# Patient Record
Sex: Female | Born: 1950
Health system: Southern US, Community
[De-identification: ages and names within clinical notes are randomized; demographics above are authoritative.]

## PROBLEM LIST (undated history)

## (undated) DIAGNOSIS — E539 Vitamin B deficiency, unspecified: Secondary | ICD-10-CM

## (undated) DIAGNOSIS — I615 Nontraumatic intracerebral hemorrhage, intraventricular: Secondary | ICD-10-CM

## (undated) DIAGNOSIS — I69951 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: Secondary | ICD-10-CM

## (undated) DIAGNOSIS — N189 Chronic kidney disease, unspecified: Secondary | ICD-10-CM

## (undated) DIAGNOSIS — R2681 Unsteadiness on feet: Secondary | ICD-10-CM

## (undated) DIAGNOSIS — K219 Gastro-esophageal reflux disease without esophagitis: Secondary | ICD-10-CM

## (undated) DIAGNOSIS — R41841 Cognitive communication deficit: Secondary | ICD-10-CM

## (undated) DIAGNOSIS — R569 Unspecified convulsions: Secondary | ICD-10-CM

## (undated) DIAGNOSIS — Z950 Presence of cardiac pacemaker: Secondary | ICD-10-CM

## (undated) DIAGNOSIS — R2689 Other abnormalities of gait and mobility: Secondary | ICD-10-CM

## (undated) DIAGNOSIS — I680 Cerebral amyloid angiopathy: Secondary | ICD-10-CM

## (undated) DIAGNOSIS — I639 Cerebral infarction, unspecified: Secondary | ICD-10-CM

## (undated) DIAGNOSIS — E785 Hyperlipidemia, unspecified: Secondary | ICD-10-CM

## (undated) DIAGNOSIS — J45909 Unspecified asthma, uncomplicated: Secondary | ICD-10-CM

## (undated) DIAGNOSIS — M6281 Muscle weakness (generalized): Secondary | ICD-10-CM

## (undated) DIAGNOSIS — M1990 Unspecified osteoarthritis, unspecified site: Secondary | ICD-10-CM

## (undated) DIAGNOSIS — I10 Essential (primary) hypertension: Secondary | ICD-10-CM

## (undated) DIAGNOSIS — G4700 Insomnia, unspecified: Secondary | ICD-10-CM

## (undated) DIAGNOSIS — R4701 Aphasia: Secondary | ICD-10-CM

## (undated) DIAGNOSIS — E119 Type 2 diabetes mellitus without complications: Secondary | ICD-10-CM

## (undated) HISTORY — PX: TONSILLECTOMY: SUR1361

## (undated) HISTORY — DX: Unspecified asthma, uncomplicated: J45.909

## (undated) HISTORY — DX: Cognitive communication deficit: R41.841

## (undated) HISTORY — PX: BREAST EXCISIONAL BIOPSY: SUR124

## (undated) HISTORY — DX: Gastro-esophageal reflux disease without esophagitis: K21.9

## (undated) HISTORY — DX: 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: I69.951

## (undated) HISTORY — PX: BREAST BIOPSY: SHX20

## (undated) HISTORY — PX: BACK SURGERY: SHX140

## (undated) HISTORY — DX: Cerebral infarction, unspecified: I63.9

## (undated) HISTORY — DX: Other abnormalities of gait and mobility: R26.89

## (undated) HISTORY — DX: Cerebral amyloid angiopathy: I68.0

## (undated) HISTORY — DX: Type 2 diabetes mellitus without complications: E11.9

## (undated) HISTORY — DX: Chronic kidney disease, unspecified: N18.9

## (undated) HISTORY — DX: Essential (primary) hypertension: I10

## (undated) HISTORY — DX: Muscle weakness (generalized): M62.81

## (undated) HISTORY — DX: Nontraumatic intracerebral hemorrhage, intraventricular: I61.5

## (undated) HISTORY — DX: Presence of cardiac pacemaker: Z95.0

## (undated) HISTORY — DX: Insomnia, unspecified: G47.00

## (undated) HISTORY — DX: Hyperlipidemia, unspecified: E78.5

## (undated) HISTORY — DX: Vitamin B deficiency, unspecified: E53.9

## (undated) HISTORY — PX: CERVICAL SPINE SURGERY: SHX589

## (undated) HISTORY — DX: Unspecified convulsions: R56.9

## (undated) HISTORY — DX: Unsteadiness on feet: R26.81

## (undated) HISTORY — DX: Unspecified osteoarthritis, unspecified site: M19.90

## (undated) HISTORY — DX: Aphasia: R47.01

---

## 2004-06-25 ENCOUNTER — Encounter: Admission: RE | Admit: 2004-06-25 | Discharge: 2004-09-23 | Payer: Self-pay | Admitting: Family Medicine

## 2004-12-13 ENCOUNTER — Encounter: Admission: RE | Admit: 2004-12-13 | Discharge: 2005-03-13 | Payer: Self-pay | Admitting: Family Medicine

## 2006-02-24 ENCOUNTER — Ambulatory Visit: Payer: Self-pay | Admitting: Family Medicine

## 2006-05-28 ENCOUNTER — Ambulatory Visit: Payer: Self-pay | Admitting: Family Medicine

## 2006-05-28 LAB — CONVERTED CEMR LAB
Glucose, Bld: 146 mg/dL — ABNORMAL HIGH (ref 70–99)
HDL: 29.4 mg/dL — ABNORMAL LOW (ref >39.0)
Triglyceride fasting, serum: 93 mg/dL (ref 0–149)

## 2006-07-07 ENCOUNTER — Ambulatory Visit: Payer: Self-pay | Admitting: Family Medicine

## 2006-07-07 LAB — CONVERTED CEMR LAB
BUN: 13 mg/dL (ref 6–23)
Chloride: 104 meq/L (ref 96–112)
Creatinine, Ser: 1.2 mg/dL (ref 0.4–1.2)
Potassium: 3 meq/L — ABNORMAL LOW (ref 3.5–5.1)

## 2006-07-18 ENCOUNTER — Ambulatory Visit: Payer: Self-pay | Admitting: Family Medicine

## 2006-07-18 LAB — CONVERTED CEMR LAB: Potassium: 3.3 meq/L — ABNORMAL LOW (ref 3.5–5.1)

## 2006-07-25 ENCOUNTER — Ambulatory Visit: Payer: Self-pay | Admitting: Family Medicine

## 2006-07-25 LAB — CONVERTED CEMR LAB: Potassium: 3.5 meq/L (ref 3.5–5.1)

## 2006-09-09 ENCOUNTER — Ambulatory Visit: Payer: Self-pay | Admitting: Family Medicine

## 2006-09-09 LAB — CONVERTED CEMR LAB
ALT: 41 units/L — ABNORMAL HIGH (ref 0–40)
BUN: 12 mg/dL (ref 6–23)
CO2: 32 meq/L (ref 19–32)
Creatinine, Ser: 0.9 mg/dL (ref 0.4–1.2)
HDL: 35.6 mg/dL — ABNORMAL LOW (ref >39.0)
Potassium: 3.4 meq/L — ABNORMAL LOW (ref 3.5–5.1)
Triglycerides: 123 mg/dL (ref 0–149)

## 2006-09-29 ENCOUNTER — Ambulatory Visit: Payer: Self-pay | Admitting: Family Medicine

## 2006-09-29 LAB — CONVERTED CEMR LAB
BUN: 10 mg/dL (ref 6–23)
Calcium: 9.6 mg/dL (ref 8.4–10.5)
Chloride: 102 meq/L (ref 96–112)
GFR calc non Af Amer: 61 mL/min
Glucose, Bld: 129 mg/dL — ABNORMAL HIGH (ref 70–99)

## 2006-12-11 DIAGNOSIS — N39498 Other specified urinary incontinence: Secondary | ICD-10-CM | POA: Insufficient documentation

## 2006-12-11 DIAGNOSIS — I10 Essential (primary) hypertension: Secondary | ICD-10-CM | POA: Insufficient documentation

## 2006-12-11 DIAGNOSIS — K219 Gastro-esophageal reflux disease without esophagitis: Secondary | ICD-10-CM | POA: Insufficient documentation

## 2006-12-11 DIAGNOSIS — E039 Hypothyroidism, unspecified: Secondary | ICD-10-CM | POA: Insufficient documentation

## 2006-12-12 ENCOUNTER — Ambulatory Visit: Payer: Self-pay | Admitting: Family Medicine

## 2006-12-12 ENCOUNTER — Encounter (INDEPENDENT_AMBULATORY_CARE_PROVIDER_SITE_OTHER): Payer: Self-pay | Admitting: Family Medicine

## 2006-12-12 ENCOUNTER — Encounter: Payer: Self-pay | Admitting: Family Medicine

## 2006-12-12 DIAGNOSIS — E785 Hyperlipidemia, unspecified: Secondary | ICD-10-CM | POA: Insufficient documentation

## 2006-12-12 DIAGNOSIS — J3089 Other allergic rhinitis: Secondary | ICD-10-CM | POA: Insufficient documentation

## 2006-12-21 LAB — CONVERTED CEMR LAB
ALT: 28 units/L (ref 0–40)
Cholesterol: 173 mg/dL (ref 0–200)
HDL: 36.7 mg/dL — ABNORMAL LOW (ref >39.0)
Total CHOL/HDL Ratio: 4.7
Triglycerides: 148 mg/dL (ref 0–149)

## 2006-12-23 ENCOUNTER — Telehealth (INDEPENDENT_AMBULATORY_CARE_PROVIDER_SITE_OTHER): Payer: Self-pay | Admitting: *Deleted

## 2006-12-26 ENCOUNTER — Ambulatory Visit: Payer: Self-pay | Admitting: Family Medicine

## 2006-12-28 LAB — CONVERTED CEMR LAB
BUN: 10 mg/dL (ref 6–23)
CO2: 32 meq/L (ref 19–32)
Calcium: 9 mg/dL (ref 8.4–10.5)
Chloride: 108 meq/L (ref 96–112)
Creatinine, Ser: 0.9 mg/dL (ref 0.4–1.2)
Hgb A1c MFr Bld: 9.5 % — ABNORMAL HIGH (ref 4.6–6.0)

## 2006-12-29 ENCOUNTER — Telehealth (INDEPENDENT_AMBULATORY_CARE_PROVIDER_SITE_OTHER): Payer: Self-pay | Admitting: *Deleted

## 2006-12-30 ENCOUNTER — Ambulatory Visit: Payer: Self-pay | Admitting: Family Medicine

## 2007-02-03 ENCOUNTER — Ambulatory Visit: Payer: Self-pay | Admitting: Family Medicine

## 2007-02-04 LAB — CONVERTED CEMR LAB
AST: 27 units/L (ref 0–37)
CO2: 29 meq/L (ref 19–32)
Cholesterol: 182 mg/dL (ref 0–200)
GFR calc Af Amer: 66 mL/min
GFR calc non Af Amer: 55 mL/min
Glucose, Bld: 133 mg/dL — ABNORMAL HIGH (ref 70–99)
HDL: 33.2 mg/dL — ABNORMAL LOW (ref >39.0)
LDL Cholesterol: 120 mg/dL — ABNORMAL HIGH (ref 0–99)
Sodium: 142 meq/L (ref 135–145)
TSH: 3.3 microintl units/mL (ref 0.35–5.50)
Total CHOL/HDL Ratio: 5.5
Triglycerides: 143 mg/dL (ref 0–149)

## 2007-02-05 ENCOUNTER — Telehealth (INDEPENDENT_AMBULATORY_CARE_PROVIDER_SITE_OTHER): Payer: Self-pay | Admitting: *Deleted

## 2007-03-13 ENCOUNTER — Encounter (INDEPENDENT_AMBULATORY_CARE_PROVIDER_SITE_OTHER): Payer: Self-pay | Admitting: Family Medicine

## 2007-03-30 HISTORY — PX: GASTRIC BYPASS: SHX52

## 2007-04-09 ENCOUNTER — Ambulatory Visit: Payer: Self-pay | Admitting: Family Medicine

## 2007-04-10 ENCOUNTER — Encounter (INDEPENDENT_AMBULATORY_CARE_PROVIDER_SITE_OTHER): Payer: Self-pay | Admitting: Family Medicine

## 2007-04-10 LAB — CONVERTED CEMR LAB
ALT: 43 units/L — ABNORMAL HIGH (ref 0–35)
AST: 40 units/L — ABNORMAL HIGH (ref 0–37)
Cholesterol: 117 mg/dL (ref 0–200)
Total CHOL/HDL Ratio: 3.8
Triglycerides: 122 mg/dL (ref 0–149)

## 2007-04-13 ENCOUNTER — Telehealth (INDEPENDENT_AMBULATORY_CARE_PROVIDER_SITE_OTHER): Payer: Self-pay | Admitting: Family Medicine

## 2007-04-20 ENCOUNTER — Encounter: Payer: Self-pay | Admitting: Family Medicine

## 2007-04-21 ENCOUNTER — Encounter: Payer: Self-pay | Admitting: Family Medicine

## 2007-04-23 ENCOUNTER — Encounter (INDEPENDENT_AMBULATORY_CARE_PROVIDER_SITE_OTHER): Payer: Self-pay | Admitting: Family Medicine

## 2007-04-23 ENCOUNTER — Encounter: Payer: Self-pay | Admitting: Family Medicine

## 2007-04-24 ENCOUNTER — Telehealth (INDEPENDENT_AMBULATORY_CARE_PROVIDER_SITE_OTHER): Payer: Self-pay | Admitting: *Deleted

## 2007-05-14 ENCOUNTER — Encounter (INDEPENDENT_AMBULATORY_CARE_PROVIDER_SITE_OTHER): Payer: Self-pay | Admitting: Family Medicine

## 2007-05-26 ENCOUNTER — Ambulatory Visit: Payer: Self-pay | Admitting: Family Medicine

## 2007-05-26 LAB — CONVERTED CEMR LAB
BUN: 8 mg/dL (ref 6–23)
CO2: 26 meq/L (ref 19–32)
Creatinine, Ser: 0.8 mg/dL (ref 0.4–1.2)
GFR calc Af Amer: 95 mL/min
HDL: 33.5 mg/dL — ABNORMAL LOW (ref >39.0)
Potassium: 3 meq/L — ABNORMAL LOW (ref 3.5–5.1)
Sodium: 135 meq/L (ref 135–145)
TSH: 2.39 microintl units/mL (ref 0.35–5.50)
Total CHOL/HDL Ratio: 3.9
Triglycerides: 111 mg/dL (ref 0–149)

## 2007-05-28 ENCOUNTER — Telehealth (INDEPENDENT_AMBULATORY_CARE_PROVIDER_SITE_OTHER): Payer: Self-pay | Admitting: *Deleted

## 2007-05-29 ENCOUNTER — Encounter (INDEPENDENT_AMBULATORY_CARE_PROVIDER_SITE_OTHER): Payer: Self-pay | Admitting: Family Medicine

## 2007-06-15 ENCOUNTER — Ambulatory Visit: Payer: Self-pay | Admitting: Family Medicine

## 2007-06-18 ENCOUNTER — Encounter (INDEPENDENT_AMBULATORY_CARE_PROVIDER_SITE_OTHER): Payer: Self-pay | Admitting: *Deleted

## 2007-06-18 ENCOUNTER — Telehealth (INDEPENDENT_AMBULATORY_CARE_PROVIDER_SITE_OTHER): Payer: Self-pay | Admitting: *Deleted

## 2007-06-18 LAB — CONVERTED CEMR LAB
ALT: 26 units/L (ref 0–35)
LDL Cholesterol: 75 mg/dL (ref 0–99)
Triglycerides: 89 mg/dL (ref 0–149)
VLDL: 18 mg/dL (ref 0–40)

## 2007-07-13 ENCOUNTER — Encounter (INDEPENDENT_AMBULATORY_CARE_PROVIDER_SITE_OTHER): Payer: Self-pay | Admitting: Family Medicine

## 2007-07-16 ENCOUNTER — Encounter (INDEPENDENT_AMBULATORY_CARE_PROVIDER_SITE_OTHER): Payer: Self-pay | Admitting: Family Medicine

## 2007-08-05 ENCOUNTER — Telehealth (INDEPENDENT_AMBULATORY_CARE_PROVIDER_SITE_OTHER): Payer: Self-pay | Admitting: *Deleted

## 2007-09-21 ENCOUNTER — Ambulatory Visit: Payer: Self-pay | Admitting: Family Medicine

## 2007-09-30 ENCOUNTER — Ambulatory Visit: Payer: Self-pay | Admitting: Family Medicine

## 2007-09-30 LAB — CONVERTED CEMR LAB
ALT: 18 units/L (ref 0–35)
AST: 24 units/L (ref 0–37)
Cholesterol: 150 mg/dL (ref 0–200)
HDL: 36.2 mg/dL — ABNORMAL LOW (ref >39.0)
TSH: 1.44 microintl units/mL (ref 0.35–5.50)
VLDL: 21 mg/dL (ref 0–40)

## 2007-10-01 ENCOUNTER — Encounter (INDEPENDENT_AMBULATORY_CARE_PROVIDER_SITE_OTHER): Payer: Self-pay | Admitting: *Deleted

## 2007-12-02 ENCOUNTER — Encounter: Payer: Self-pay | Admitting: Family Medicine

## 2008-03-16 ENCOUNTER — Ambulatory Visit: Payer: Self-pay | Admitting: Family Medicine

## 2008-03-27 LAB — CONVERTED CEMR LAB
ALT: 24 units/L (ref 0–35)
AST: 28 units/L (ref 0–37)
Albumin: 3.8 g/dL (ref 3.5–5.2)
BUN: 13 mg/dL (ref 6–23)
Basophils Relative: 0 % (ref 0.0–3.0)
CO2: 31 meq/L (ref 19–32)
Calcium: 9.5 mg/dL (ref 8.4–10.5)
Chloride: 106 meq/L (ref 96–112)
Creatinine, Ser: 1 mg/dL (ref 0.4–1.2)
Creatinine,U: 373.3 mg/dL
Eosinophils Absolute: 0.3 10*3/uL (ref 0.0–0.7)
Eosinophils Relative: 4 % (ref 0.0–5.0)
Folate: 6.6 ng/mL
Free T4: 0.9 ng/dL (ref 0.6–1.6)
Glucose, Bld: 91 mg/dL (ref 70–99)
HCT: 41.1 % (ref 36.0–46.0)
HDL: 41.7 mg/dL (ref >39.0)
Hemoglobin: 14.1 g/dL (ref 12.0–15.0)
MCV: 89.9 fL (ref 78.0–100.0)
Microalb, Ur: 1.4 mg/dL (ref 0.0–1.9)
Monocytes Absolute: 0.4 10*3/uL (ref 0.1–1.0)
Monocytes Relative: 5.9 % (ref 3.0–12.0)
Neutro Abs: 1.8 10*3/uL (ref 1.4–7.7)
Platelets: 165 10*3/uL (ref 150–400)
T3, Free: 2.9 pg/mL (ref 2.3–4.2)
TSH: 1.49 microintl units/mL (ref 0.35–5.50)
Total Protein: 7.1 g/dL (ref 6.0–8.3)
Transferrin: 221.6 mg/dL (ref >=212.0)
WBC: 6.6 10*3/uL (ref 4.5–10.5)

## 2008-03-28 ENCOUNTER — Telehealth (INDEPENDENT_AMBULATORY_CARE_PROVIDER_SITE_OTHER): Payer: Self-pay | Admitting: *Deleted

## 2008-03-30 ENCOUNTER — Ambulatory Visit: Payer: Self-pay | Admitting: Family Medicine

## 2008-04-28 ENCOUNTER — Ambulatory Visit: Payer: Self-pay | Admitting: Family Medicine

## 2008-04-28 DIAGNOSIS — G4700 Insomnia, unspecified: Secondary | ICD-10-CM | POA: Insufficient documentation

## 2008-05-02 ENCOUNTER — Encounter (INDEPENDENT_AMBULATORY_CARE_PROVIDER_SITE_OTHER): Payer: Self-pay | Admitting: *Deleted

## 2008-05-13 ENCOUNTER — Ambulatory Visit: Payer: Self-pay | Admitting: Pulmonary Disease

## 2008-06-02 ENCOUNTER — Telehealth (INDEPENDENT_AMBULATORY_CARE_PROVIDER_SITE_OTHER): Payer: Self-pay | Admitting: *Deleted

## 2008-06-28 ENCOUNTER — Ambulatory Visit (HOSPITAL_BASED_OUTPATIENT_CLINIC_OR_DEPARTMENT_OTHER): Admission: RE | Admit: 2008-06-28 | Discharge: 2008-06-28 | Payer: Self-pay | Admitting: Pulmonary Disease

## 2008-06-28 ENCOUNTER — Encounter: Payer: Self-pay | Admitting: Pulmonary Disease

## 2008-06-30 ENCOUNTER — Ambulatory Visit: Payer: Self-pay | Admitting: Pulmonary Disease

## 2008-07-04 ENCOUNTER — Telehealth (INDEPENDENT_AMBULATORY_CARE_PROVIDER_SITE_OTHER): Payer: Self-pay | Admitting: *Deleted

## 2008-08-02 ENCOUNTER — Encounter: Payer: Self-pay | Admitting: Family Medicine

## 2008-08-02 ENCOUNTER — Telehealth (INDEPENDENT_AMBULATORY_CARE_PROVIDER_SITE_OTHER): Payer: Self-pay | Admitting: *Deleted

## 2008-08-04 ENCOUNTER — Telehealth: Payer: Self-pay | Admitting: Family Medicine

## 2008-08-11 ENCOUNTER — Ambulatory Visit: Payer: Self-pay | Admitting: Pulmonary Disease

## 2008-08-11 DIAGNOSIS — G4733 Obstructive sleep apnea (adult) (pediatric): Secondary | ICD-10-CM | POA: Insufficient documentation

## 2008-08-15 ENCOUNTER — Ambulatory Visit: Payer: Self-pay | Admitting: Family Medicine

## 2008-08-15 DIAGNOSIS — I495 Sick sinus syndrome: Secondary | ICD-10-CM | POA: Insufficient documentation

## 2008-08-15 DIAGNOSIS — E538 Deficiency of other specified B group vitamins: Secondary | ICD-10-CM | POA: Insufficient documentation

## 2008-08-15 DIAGNOSIS — E559 Vitamin D deficiency, unspecified: Secondary | ICD-10-CM | POA: Insufficient documentation

## 2009-01-09 ENCOUNTER — Encounter: Payer: Self-pay | Admitting: Family Medicine

## 2009-04-14 ENCOUNTER — Encounter: Payer: Self-pay | Admitting: Family Medicine

## 2009-04-28 ENCOUNTER — Encounter: Payer: Self-pay | Admitting: Family Medicine

## 2009-08-09 ENCOUNTER — Telehealth (INDEPENDENT_AMBULATORY_CARE_PROVIDER_SITE_OTHER): Payer: Self-pay | Admitting: *Deleted

## 2009-09-21 ENCOUNTER — Ambulatory Visit: Payer: Self-pay | Admitting: Family Medicine

## 2009-10-04 ENCOUNTER — Telehealth (INDEPENDENT_AMBULATORY_CARE_PROVIDER_SITE_OTHER): Payer: Self-pay | Admitting: *Deleted

## 2009-10-05 ENCOUNTER — Telehealth: Payer: Self-pay | Admitting: Family Medicine

## 2009-10-05 LAB — CONVERTED CEMR LAB
ALT: 24 units/L (ref 0–35)
AST: 28 units/L (ref 0–37)
Alkaline Phosphatase: 79 units/L (ref 39–117)
BUN: 19 mg/dL (ref 6–23)
CO2: 31 meq/L (ref 19–32)
Chloride: 106 meq/L (ref 96–112)
Cholesterol: 160 mg/dL (ref 0–200)
Creatinine, Ser: 1 mg/dL (ref 0.4–1.2)
LDL Cholesterol: 89 mg/dL (ref 0–99)
Potassium: 3.1 meq/L — ABNORMAL LOW (ref 3.5–5.1)
TSH: 1.37 microintl units/mL (ref 0.35–5.50)
Total Bilirubin: 0.6 mg/dL (ref 0.3–1.2)
Total CHOL/HDL Ratio: 3
Triglycerides: 82 mg/dL (ref 0.0–149.0)

## 2009-11-16 ENCOUNTER — Ambulatory Visit: Payer: Self-pay | Admitting: Family Medicine

## 2009-11-22 ENCOUNTER — Ambulatory Visit: Payer: Self-pay | Admitting: Family Medicine

## 2009-11-24 LAB — CONVERTED CEMR LAB
ALT: 22 units/L (ref 0–35)
AST: 25 units/L (ref 0–37)
Albumin: 3.7 g/dL (ref 3.5–5.2)
Alkaline Phosphatase: 78 units/L (ref 39–117)
Basophils Absolute: 0 10*3/uL (ref 0.0–0.1)
CO2: 31 meq/L (ref 19–32)
Calcium: 9 mg/dL (ref 8.4–10.5)
Cholesterol: 151 mg/dL (ref 0–200)
Creatinine, Ser: 1.1 mg/dL (ref 0.4–1.2)
Eosinophils Absolute: 0.2 10*3/uL (ref 0.0–0.7)
GFR calc non Af Amer: 65.32 mL/min (ref >60)
Glucose, Bld: 81 mg/dL (ref 70–99)
HDL: 47.1 mg/dL (ref >39.00)
Lymphocytes Relative: 57.1 % — ABNORMAL HIGH (ref 12.0–46.0)
MCHC: 34.7 g/dL (ref 30.0–36.0)
Monocytes Relative: 6 % (ref 3.0–12.0)
Neutrophils Relative %: 32.4 % — ABNORMAL LOW (ref 43.0–77.0)
Platelets: 147 10*3/uL — ABNORMAL LOW (ref 150.0–400.0)
RDW: 13.5 % (ref 11.5–14.6)
Sodium: 142 meq/L (ref 135–145)
TSH: 2.19 microintl units/mL (ref 0.35–5.50)
Total Protein: 7.1 g/dL (ref 6.0–8.3)
Triglycerides: 88 mg/dL (ref 0.0–149.0)
VLDL: 17.6 mg/dL (ref 0.0–40.0)
Vitamin B-12: 195 pg/mL — ABNORMAL LOW (ref 211–911)

## 2009-12-07 ENCOUNTER — Ambulatory Visit: Payer: Self-pay | Admitting: Family Medicine

## 2009-12-14 ENCOUNTER — Telehealth (INDEPENDENT_AMBULATORY_CARE_PROVIDER_SITE_OTHER): Payer: Self-pay | Admitting: *Deleted

## 2009-12-14 ENCOUNTER — Ambulatory Visit: Payer: Self-pay | Admitting: Family Medicine

## 2009-12-21 ENCOUNTER — Ambulatory Visit: Payer: Self-pay | Admitting: Family Medicine

## 2009-12-28 ENCOUNTER — Ambulatory Visit: Payer: Self-pay | Admitting: Family Medicine

## 2010-01-04 ENCOUNTER — Ambulatory Visit: Payer: Self-pay | Admitting: Family Medicine

## 2010-01-05 LAB — CONVERTED CEMR LAB: Vitamin B-12: 985 pg/mL — ABNORMAL HIGH (ref 211–911)

## 2010-01-18 ENCOUNTER — Ambulatory Visit: Payer: Self-pay | Admitting: Family Medicine

## 2010-01-18 DIAGNOSIS — E119 Type 2 diabetes mellitus without complications: Secondary | ICD-10-CM | POA: Insufficient documentation

## 2010-01-23 ENCOUNTER — Ambulatory Visit: Payer: Self-pay | Admitting: Family Medicine

## 2010-01-24 LAB — CONVERTED CEMR LAB
AST: 25 units/L (ref 0–37)
Alkaline Phosphatase: 84 units/L (ref 39–117)
Bilirubin, Direct: 0.1 mg/dL (ref 0.0–0.3)
CO2: 30 meq/L (ref 19–32)
Calcium: 9.2 mg/dL (ref 8.4–10.5)
Eosinophils Relative: 3.4 % (ref 0.0–5.0)
GFR calc non Af Amer: 78.26 mL/min (ref >60)
HDL: 45.3 mg/dL (ref >39.00)
Hgb A1c MFr Bld: 5.8 % (ref 4.6–6.5)
Monocytes Relative: 5.7 % (ref 3.0–12.0)
Neutrophils Relative %: 37.1 % — ABNORMAL LOW (ref 43.0–77.0)
Platelets: 150 10*3/uL (ref 150.0–400.0)
Potassium: 3.9 meq/L (ref 3.5–5.1)
Sodium: 144 meq/L (ref 135–145)
TSH: 1.74 microintl units/mL (ref 0.35–5.50)
Total CHOL/HDL Ratio: 4
Total Protein: 7.4 g/dL (ref 6.0–8.3)
VLDL: 15.4 mg/dL (ref 0.0–40.0)
WBC: 6 10*3/uL (ref 4.5–10.5)

## 2010-02-23 ENCOUNTER — Ambulatory Visit: Payer: Self-pay | Admitting: Family Medicine

## 2010-04-04 ENCOUNTER — Ambulatory Visit: Payer: Self-pay | Admitting: Family Medicine

## 2010-05-04 ENCOUNTER — Ambulatory Visit: Payer: Self-pay | Admitting: Family Medicine

## 2010-08-28 NOTE — Progress Notes (Signed)
Summary: POTASSIUM REFILL  Phone Note Call from Patient   Caller: Patient Summary of Call: NEEDS REFILL ON POTASSIUM KLOR-CON  PLEASE CALL [facility] [address] [facility] [place] Initial call taken by: Jerolyn Shin,  Dec 14, 2009 4:15 PM    Prescriptions: KLOR-CON M20 20 MEQ CR-TABS (POTASSIUM CHLORIDE CRYS CR) 1 by mouth three times a day  #90 x 0   Entered by:   Army Fossa CMA   Authorized by:   Loreen Freud DO   Signed by:   Army Fossa CMA on 12/14/2009   Method used:   Electronically to        [facility] # [phone]* (retail)       [address]       [place], [place]  [address]       Ph: [phone]       Fax: [phone]   RxID:   [id number]

## 2010-08-28 NOTE — Assessment & Plan Note (Signed)
Summary: 2nd b-12/cbs  Nurse Visit   Allergies: 1)  ! Demerol 2)  ! Sulfa  Medication Administration  Injection # 1:    Medication: Vit B12 1000 mcg    Diagnosis: B12 DEFICIENCY (ICD-266.2)    Route: IM    Site: R deltoid    Exp Date: 04/13    Lot #: [id number]    Mfr: app pharmaceuticals    Patient tolerated injection without complications    Given by: Army Fossa CMA (Dec 14, 2009 4:32 PM)  Orders Added: 1)  Admin of Therapeutic Inj  intramuscular or subcutaneous [96372] 2)  Vit B12 1000 mcg [J3420]

## 2010-08-28 NOTE — Assessment & Plan Note (Signed)
Summary: discuss sleep meds/drb   Vital Signs:  Patient profile:   60 year old female Weight:      227 pounds Pulse rate:   66 / minute Pulse rhythm:   regular BP sitting:   136 / 80  (left arm) Cuff size:   regular  Vitals Entered By: Army Fossa CMA (November 16, 2009 4:07 PM) CC: Pt here to discuss Ristoril. Refill on Synthroid.   History of Present Illness:  Hyperlipidemia follow-up      This is a 60 year old woman who presents for Hyperlipidemia follow-up.  The patient denies muscle aches, GI upset, abdominal pain, flushing, itching, constipation, diarrhea, and fatigue.  The patient denies the following symptoms: chest pain/pressure, exercise intolerance, dypsnea, palpitations, syncope, and pedal edema.  Compliance with medications (by patient report) has been near 100%.  Dietary compliance has been good.    Hypertension follow-up      The patient also presents for Hypertension follow-up.  The patient denies lightheadedness, urinary frequency, headaches, edema, impotence, rash, and fatigue.  The patient denies the following associated symptoms: chest pain, chest pressure, exercise intolerance, dyspnea, palpitations, syncope, leg edema, and pedal edema.  Compliance with medications (by patient report) has been near 100%.  The patient reports that dietary compliance has been good.  Adjunctive measures currently used by the patient include salt restriction.    Type 1 diabetes mellitus follow-up      The patient is also here for Type 2 diabetes mellitus follow-up.  The patient denies polyuria, polydipsia, blurred vision, self managed hypoglycemia, hypoglycemia requiring help, weight loss, weight gain, and numbness of extremities.  The patient denies the following symptoms: neuropathic pain, chest pain, vomiting, orthostatic symptoms, poor wound healing, intermittent claudication, vision loss, and foot ulcer.  Since the last visit the patient reports compliance with medications, not  exercising regularly, and monitoring blood glucose.    Allergies: 1)  ! Demerol 2)  ! Sulfa  Past History:  Past medical, surgical, family and social histories (including risk factors) reviewed for relevance to current acute and chronic problems.  Past Medical History: Reviewed history from 08/11/2008 and no changes required. Hyperlipidemia Diabetes mellitus, type II Hypertension Asthma Allergies Hypothyroidism OSA, AHI 12.5 from [facility] on 06/28/08  Past Surgical History: Reviewed history from 05/13/2008 and no changes required. 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 Mother - COPD Sister - colon cancer  Social History: Reviewed history from 05/13/2008 and no changes required. Occupation:office specialist Divorced Never Smoked Alcohol use-no  Review of Systems      See HPI  Physical Exam  General:  Well-developed,well-nourished,in no acute distress; alert,appropriate and cooperative throughout examination Lungs:  Normal respiratory effort, chest expands symmetrically. Lungs are clear to auscultation, no crackles or wheezes. Heart:  normal rate and no murmur.   Extremities:  No clubbing, cyanosis, edema, or deformity noted with normal full range of motion of all joints.   Psych:  Cognition and judgment appear intact. Alert and cooperative with normal attention span and concentration. No apparent delusions, illusions, hallucinations   Impression & Recommendations:  Problem # 1:  HYPERTENSION (ICD-401.9)  Her updated medication list for this problem includes:    Cozaar 100 Mg Tabs (Losartan potassium) .Marland Kitchen... Take 1 tab once daily    Norvasc 10 Mg Tabs (Amlodipine besylate) .Marland Kitchen... 1 by mouth once daily  BP today: 136/80 Prior BP: 140/82 (09/21/2009)  Labs Reviewed: K+: 3.1 (09/21/2009) Creat: :  1.0 (09/21/2009)   Chol: 160 (09/21/2009)   HDL: 54.40 (09/21/2009)    LDL: 89 (09/21/2009)   TG: 82.0 (09/21/2009)  Problem # 2:  HYPOTHYROIDISM (ICD-244.9)  Her updated medication list for this problem includes:    Synthroid 100 Mcg Tabs (Levothyroxine sodium) .Marland Kitchen... Take one tablet once daily  Labs Reviewed: TSH: 1.37 (09/21/2009)    HgBA1c: 5.6 (03/16/2008) Chol: 160 (09/21/2009)   HDL: 54.40 (09/21/2009)   LDL: 89 (09/21/2009)   TG: 82.0 (09/21/2009)  Problem # 3:  INSOMNIA (ICD-780.52)  Problem # 4:  S/P LAPS GSTR RSTCV PX W/BYP ROUX-EN-Y <150 CM ([id number])  Problem # 5:  B12 DEFICIENCY (ICD-266.2)  Problem # 6:  UNSPECIFIED VITAMIN D DEFICIENCY (ICD-268.9)  Complete Medication List: 1)  Synthroid 100 Mcg Tabs (Levothyroxine sodium) .... Take one tablet once daily 2)  Protonix 40 Mg Tbec (Pantoprazole sodium) .... Take one tablet daily 3)  Cozaar 100 Mg Tabs (Losartan potassium) .... Take 1 tab once daily 4)  Cyclobenzaprine Hcl 5 Mg Tabs (Cyclobenzaprine hcl) .... Take 1tab as needed bedtime 5)  Vitamin D 19147 Unit Caps (Ergocalciferol) .... Take 1 tab by mouth weekly 6)  Klor-con M20 20 Meq Cr-tabs (Potassium chloride crys cr) .Marland Kitchen.. 1 by mouth two times a day 7)  Norvasc 10 Mg Tabs (Amlodipine besylate) .Marland Kitchen.. 1 by mouth once daily 8)  Vitamin D 82956 Unit Caps (Ergocalciferol) .Marland Kitchen.. 1 by mouth qweek 9)  B-12 Dots 500 Mcg Subl (Cyanocobalamin) .Marland Kitchen.. 1 by mouth qd 10)  Citracal Petites/vitamin D 200-250 Mg-unit Tabs (Calcium citrate-vitamin d) .... 2 by mouth two times a day 11)  Gabapentin 300 Mg Caps (Gabapentin) .... Take 1 by mouth at bedtime. 12)  Claritin 10 Mg Tabs (Loratadine) .Marland Kitchen.. 1 by mouth once daily as needed 13)  Restoril 15 Mg Caps (Temazepam) .Marland Kitchen.. 1 by mouth q hs.  Patient Instructions: 1)  401.9 ,436.44 ,   bmp  cbcd, lipid, b12, vita D, hep, ibc, ferritin, tsh--fasting labs 2)  rto 3 months Prescriptions: RESTORIL 15 MG CAPS (TEMAZEPAM) 1 by mouth q hs.  #30 x 0   Entered and Authorized by:   Loreen Freud DO   Signed by:    Loreen Freud DO on 11/16/2009   Method used:   Print then Give to Patient   RxID:   [id number] SYNTHROID 100 MCG  TABS (LEVOTHYROXINE SODIUM) Take one tablet once daily  #90 x 3   Entered and Authorized by:   Loreen Freud DO   Signed by:   Loreen Freud DO on 11/16/2009   Method used:   Electronically to        [facility]* (mail-order)             ,          Ph: [phone]       Fax: [phone]   RxID:   [id number]

## 2010-08-28 NOTE — Assessment & Plan Note (Signed)
Summary: b-12/cbs  Nurse Visit   Allergies: 1)  ! Demerol 2)  ! Sulfa  Medication Administration  Injection # 1:    Medication: Vit B12 1000 mcg    Diagnosis: B12 DEFICIENCY (ICD-266.2)    Route: IM    Site: R deltoid    Exp Date: 09/27/2011    Lot #: [id number]    Mfr: [employer]    Patient tolerated injection without complications    Given by: Jeremy Johann CMA (February 23, 2010 3:36 PM)  Orders Added: 1)  Admin of Therapeutic Inj  intramuscular or subcutaneous [96372] 2)  Vit B12 1000 mcg [J3420]

## 2010-08-28 NOTE — Assessment & Plan Note (Signed)
Summary: B12/KN  Nurse Visit   Allergies: 1)  ! Demerol 2)  ! Sulfa  Medication Administration  Injection # 1:    Medication: Vit B12 1000 mcg    Diagnosis: B12 DEFICIENCY (ICD-266.2)    Route: IM    Site: R deltoid    Exp Date: 09/27/2011    Lot #: [id number]    Mfr: [employer]ent    Given by: Doristine Devoid CMA (April 04, 2010 1:13 PM)  Orders Added: 1)  Vit B12 1000 mcg [J3420] 2)  Admin of Therapeutic Inj  intramuscular or subcutaneous [96372]   Medication Administration  Injection # 1:    Medication: Vit B12 1000 mcg    Diagnosis: B12 DEFICIENCY (ICD-266.2)    Route: IM    Site: R deltoid    Exp Date: 09/27/2011    Lot #: [id number]    Mfr: [employer]ent    Given by: Doristine Devoid CMA (April 04, 2010 1:13 PM)  Orders Added: 1)  Vit B12 1000 mcg [J3420] 2)  Admin of Therapeutic Inj  intramuscular or subcutaneous [52841]

## 2010-08-28 NOTE — Progress Notes (Signed)
Summary: Lab Results   Phone Note Outgoing Call   Call placed by: Army Fossa CMA,  October 05, 2009 8:28 AM Summary of Call: Regarding lab results, LMTCB:  increase kcl to two times a day  recheck 1 month-----bmp 401,9 Signed by Loreen Freud DO on 10/04/2009 at 7:52 PM   Follow-up for Phone Call        Pt states that she needs a refill on Restoril 15mg - Dr.Igwemeze prescribed this for her in the past and he told her she needed to contact her PCP to get any further refills.   Pt is aware of lab results.Army Fossa CMA  October 05, 2009 12:55 PM   Additional Follow-up for Phone Call Additional follow up Details #1::        ok to refill x1---  but if she needs to take it everyday she will need ov to discuss sleep hygiene etc. Additional Follow-up by: Loreen Freud DO,  October 05, 2009 1:25 PM    Additional Follow-up for Phone Call Additional follow up Details #2::    Pt states that she takes it every night. Per dr Laury Axon she needs an OV, pt made an appt. Army Fossa CMA  October 05, 2009 2:35 PM   New/Updated Medications: KLOR-CON M20 20 MEQ CR-TABS (POTASSIUM CHLORIDE CRYS CR) 1 by mouth two times a day RESTORIL 15 MG CAPS (TEMAZEPAM) 1 by mouth q hs. Prescriptions: RESTORIL 15 MG CAPS (TEMAZEPAM) 1 by mouth q hs.  #30 x 0   Entered by:   Army Fossa CMA   Authorized by:   Loreen Freud DO   Signed by:   Army Fossa CMA on 10/05/2009   Method used:   Printed then faxed to ...       [facility]  [address] # [phone]* (retail)       [phone]       [place], [place]  [address]       Ph: [phone]       Fax: [phone]   RxID:   [id number] KLOR-CON M20 20 MEQ CR-TABS (POTASSIUM CHLORIDE CRYS CR) 1 by mouth two times a day  #60 x 0   Entered by:   Army Fossa CMA   Authorized by:   Loreen Freud DO   Signed by:   Army Fossa CMA on 10/05/2009   Method used:   Electronically to        [facility] # [phone]* (retail)   [address]       [place], [place]  [address]       Ph: [phone]       Fax: [phone]   RxID:   [id number]

## 2010-08-28 NOTE — Assessment & Plan Note (Signed)
Summary: B-12, flu shot //PH  Nurse Visit  CC: B-12 and flu shot./kb   Allergies: 1)  ! Demerol 2)  ! Sulfa  Medication Administration  Injection # 1:    Medication: Vit B12 1000 mcg    Diagnosis: B12 DEFICIENCY (ICD-266.2)    Route: IM    Site: L deltoid    Exp Date: 09/27/2011    Lot #: [id number]    Mfr: [employer]ent    Patient tolerated injection without complications    Given by: Lucious Groves CMA (May 04, 2010 4:14 PM)  Orders Added: 1)  Admin 1st Vaccine [90471] 2)  Flu Vaccine 69yrs + [16109] 3)  Vit B12 1000 mcg [J3420] 4)  Admin of Therapeutic Inj  intramuscular or subcutaneous [96372]              Flu Vaccine Consent Questions     Do you have a history of severe allergic reactions to this vaccine? no    Any prior history of allergic reactions to egg and/or gelatin? no    Do you have a sensitivity to the preservative Thimersol? no    Do you have a past history of Guillan-Barre Syndrome? no    Do you currently have an acute febrile illness? no    Have you ever had a severe reaction to latex? no    Vaccine information given and explained to patient? yes    Are you currently pregnant? no    Lot Number:[id number]   Exp Date:01/26/2011   Site Given  Left Deltoid IMu

## 2010-08-28 NOTE — Letter (Signed)
Summary: Surgical Clearance/[facility]  Surgical Clearance/[facility]   Imported By: Lanelle Bal 02/06/2010 12:22:23  _____________________________________________________________________  External Attachment:    Type:   Image     Comment:   External Document

## 2010-08-28 NOTE — Progress Notes (Signed)
Summary: REFILL  Phone Note Refill Request Message from:  Fax from Pharmacy on [facility] [address] FAX [phone]  Refills Requested: Medication #1:  SYNTHROID 100 MCG  TABS Take one tablet once daily Initial call taken by: Barb Merino,  August 09, 2009 11:39 AM    Prescriptions: SYNTHROID 100 MCG  TABS (LEVOTHYROXINE SODIUM) Take one tablet once daily  #30 x 0   Entered by:   Army Fossa CMA   Authorized by:   Loreen Freud DO   Signed by:   Army Fossa CMA on 08/09/2009   Method used:   Electronically to        [facility] # [phone]* (retail)       [address]       [place], [place]  [address]       Ph: [phone]       Fax: [phone]   RxID:   [id number]

## 2010-08-28 NOTE — Progress Notes (Signed)
  Phone Note Call from Patient   Caller: Patient Summary of Call: Pt called and she is out of her Synthroid- would like to to get a month refill at the local pharmacy and send a 3 month supply to [facility].     Prescriptions: CLARITIN 10 MG TABS (LORATADINE) 1 by mouth once daily as needed  #90 x 0   Entered by:   Army Fossa CMA   Authorized by:   Loreen Freud DO   Signed by:   Army Fossa CMA on 10/04/2009   Method used:   Electronically to        [facility]* (mail-order)             ,          Ph: [phone]       Fax: [phone]   RxID:   [id number] SYNTHROID 100 MCG  TABS (LEVOTHYROXINE SODIUM) Take one tablet once daily  #90 x 0   Entered by:   Army Fossa CMA   Authorized by:   Loreen Freud DO   Signed by:   Army Fossa CMA on 10/04/2009   Method used:   Electronically to        [facility]* (mail-order)             ,          Ph: [phone]       Fax: [phone]   RxID:   [id number] SYNTHROID 100 MCG  TABS (LEVOTHYROXINE SODIUM) Take one tablet once daily  #30 x 0   Entered by:   Army Fossa CMA   Authorized by:   Loreen Freud DO   Signed by:   Army Fossa CMA on 10/04/2009   Method used:   Electronically to        [facility] [address] # [phone]* (retail)       [address]       [place], [place]  [address]       Ph: [phone]       Fax: [phone]   RxID:   [id number]

## 2010-08-28 NOTE — Assessment & Plan Note (Signed)
Summary: 4th b-12/cbs  Nurse Visit   Allergies: 1)  ! Demerol 2)  ! Sulfa  Medication Administration  Injection # 1:    Medication: Vit B12 1000 mcg    Diagnosis: B12 DEFICIENCY (ICD-266.2)    Route: IM    Site: R deltoid    Exp Date: 10/2011    Lot #: [id number]    Mfr: [employer]    Patient tolerated injection without complications    Given by: Army Fossa CMA (December 28, 2009 4:18 PM)  Orders Added: 1)  Vit B12 1000 mcg [J3420] 2)  Admin of Therapeutic Inj  intramuscular or subcutaneous [35573]

## 2010-08-28 NOTE — Assessment & Plan Note (Signed)
Summary: clearance for surgery/cbs   Vital Signs:  Patient profile:   60 year old female Height:      65 inches Weight:      233 pounds Pulse rate:   72 / minute Pulse rhythm:   regular Resp:     12 per minute BP sitting:   130 / 84  (left arm) Cuff size:   large  Vitals Entered By: Army Fossa CMA (January 18, 2010 3:59 PM) CC: Pt here for Surgical clearance, Pre-op Evaluation   History of Present Illness:  Pre-op Evaluation      I was asked to see this delightful patient today for Pre-op Evaluation.  Pt is here for surgical clearance for bunionectomy L foot with Dr Kaylyn Layer for July 13.  The patient denies respiratory symptoms, GI bleeding, chest pain, edema, PND, heavy ETOH use, and smoking.  Patient has no history of acute or recent MI, unstable or severe angina, decompensated CHF, high grade AV block, symptomatic ventricular arrhythmia, and severe valvular disease.  Positive PMH placing the patient at moderate risk for surgery includes diabetes(m) and rhythm other than sinus(l).  There is no history of antiplatelet agents, chronic steroids, warfarin, diabetes meds, antianginal meds, bleeding disorder, and FH anesthesia reaction.  Pt has a hx of DM but since her weight loss has been off all DM meds.  Pt sees Cardiologist at [facility].  Preventive Screening-Counseling & Management  Alcohol-Tobacco     Alcohol drinks/day: 0     Smoking Status: quit     Year Quit: 1991     Pack years: 5 years x3-4 cigarettes daily  Caffeine-Diet-Exercise     Caffeine use/day: 1     Diet Comments: poor--s/p gastric bypass     Diet Counseling: to improve diet; diet is suboptimal     Nutrition Referrals: yes     Does Patient Exercise: no     Exercise Counseling: to improve exercise regimen      Sexual History:  single.        Drug Use:  no.    Current Medications (verified): 1)  Synthroid 100 Mcg  Tabs (Levothyroxine Sodium) .... Take One Tablet Once Daily 2)  Protonix 40 Mg  Tbec  (Pantoprazole Sodium) .... Take One Tablet Daily 3)  Hyzaar 100-25 Mg Tabs (Losartan Potassium-Hctz) .Marland Kitchen.. 1 By Mouth Daily 4)  Cyclobenzaprine Hcl 5 Mg  Tabs (Cyclobenzaprine Hcl) .... Take 1tab As Needed Bedtime 5)  Vitamin D 88416 Unit Caps (Ergocalciferol) .... Take 1 Tab By Mouth Weekly 6)  Klor-Con M20 20 Meq Cr-Tabs (Potassium Chloride Crys Cr) .Marland Kitchen.. 1 By Mouth Three Times A Day 7)  Norvasc 10 Mg Tabs (Amlodipine Besylate) .Marland Kitchen.. 1 By Mouth Once Daily 8)  B-12 Dots 500 Mcg Subl (Cyanocobalamin) .Marland Kitchen.. 1 By Mouth Qd 9)  Citracal Petites/vitamin D 200-250 Mg-Unit Tabs (Calcium Citrate-Vitamin D) .... 2 By Mouth Two Times A Day 10)  Gabapentin 300 Mg Caps (Gabapentin) .... Take 1 By Mouth At Bedtime. 11)  Claritin 10 Mg Tabs (Loratadine) .Marland Kitchen.. 1 By Mouth Once Daily As Needed 12)  Restoril 15 Mg Caps (Temazepam) .Marland Kitchen.. 1 By Mouth Q Hs. 13)  Vitamin D (Ergocalciferol) 50000 Unit Caps (Ergocalciferol) .Marland Kitchen.. 1 By Mouth Once Weekly. 14)  Vitamin D3 2000 Unit Caps (Cholecalciferol) .Marland Kitchen.. 1 By Mouth Daily.  Allergies: 1)  ! Demerol 2)  ! Sulfa  Past History:  Family History: Last updated: 01/18/2010 Mother - COPD Sister - colon cancer Family History of Colon CA 1st degree  relative <60  Social History: Last updated: 01/18/2010 Occupation:office specialist Divorced Never Smoked Alcohol use-no Drug use-no  Risk Factors: Alcohol Use: 0 (01/18/2010) Caffeine Use: 1 (01/18/2010) Diet: poor--s/p gastric bypass (01/18/2010) Exercise: no (01/18/2010)  Risk Factors: Smoking Status: quit (01/18/2010)  Past Medical History: Hyperlipidemia Diabetes mellitus, type II Hypertension Asthma Allergies Hypothyroidism OSA, AHI 12.5 from [facility] on 06/28/08 Current Problems:  SINUS BRADYCARDIA (ICD-427.81) B12 DEFICIENCY (ICD-266.2) UNSPECIFIED VITAMIN D DEFICIENCY (ICD-268.9) OBSTRUCTIVE SLEEP APNEA (ICD-327.23) INSOMNIA (ICD-780.52) S/P LAPS GSTR RSTCV PX W/BYP ROUX-EN-Y <150 CM  ([id number]) HYPERLIPIDEMIA (ICD-272.4) RHINITIS, ALLERGIC NEC (ICD-477.8) OBESITY, MORBID (ICD-278.01) STRESS INCONTINENCE (ICD-788.39) HYPOTHYROIDISM (ICD-244.9) HYPERTENSION (ICD-401.9) GERD (ICD-530.81)  Past Surgical History: Gastric Bypass 09/08 Tubal ligation Cholecystectomy cspine fusion 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 Mother - COPD Sister - colon cancer Family History of Colon CA 1st degree relative <60  Social History: Reviewed history from 05/13/2008 and no changes required. Occupation:office specialist Divorced Never Smoked Alcohol use-no Drug use-no Drug Use:  no Caffeine use/day:  1 Does Patient Exercise:  no Sexual History:  single  Review of Systems      See HPI General:  Denies chills, fatigue, fever, loss of appetite, malaise, sleep disorder, sweats, weakness, and weight loss. Eyes:  Denies blurring, discharge, double vision, eye irritation, eye pain, halos, itching, light sensitivity, red eye, vision loss-1 eye, and vision loss-both eyes; opthoq1y. ENT:  Denies decreased hearing, difficulty swallowing, ear discharge, earache, hoarseness, nasal congestion, nosebleeds, postnasal drainage, ringing in ears, sinus pressure, and sore throat. CV:  Denies bluish discoloration of lips or nails, chest pain or discomfort, difficulty breathing at night, difficulty breathing while lying down, fainting, fatigue, leg cramps with exertion, lightheadness, near fainting, palpitations, shortness of breath with exertion, swelling of feet, swelling of hands, and weight gain. Resp:  Denies chest discomfort, chest pain with inspiration, cough, coughing up blood, excessive snoring, hypersomnolence, morning headaches, pleuritic, shortness of breath, sputum productive, and wheezing. GI:  Denies abdominal pain, bloody stools, change in bowel habits, constipation, dark  tarry stools, diarrhea, excessive appetite, gas, hemorrhoids, indigestion, and loss of appetite. GU:  Denies abnormal vaginal bleeding, decreased libido, discharge, dysuria, genital sores, hematuria, incontinence, nocturia, urinary frequency, and urinary hesitancy. MS:  Complains of joint pain; denies joint redness, joint swelling, loss of strength, low back pain, mid back pain, muscle aches, muscle , cramps, muscle weakness, stiffness, and thoracic pain; L foot. Derm:  Denies changes in color of skin, changes in nail beds, dryness, excessive perspiration, flushing, hair loss, insect bite(s), itching, lesion(s), poor wound healing, and rash. Neuro:  Denies brief paralysis, difficulty with concentration, disturbances in coordination, falling down, headaches, inability to speak, memory loss, numbness, poor balance, seizures, sensation of room spinning, tingling, tremors, visual disturbances, and weakness. Psych:  Denies alternate hallucination ( auditory/visual), anxiety, depression, easily angered, easily tearful, irritability, mental problems, panic attacks, sense of great danger, suicidal thoughts/plans, thoughts of violence, unusual visions or sounds, and thoughts /plans of harming others. Endo:  Complains of weight change; denies cold intolerance, excessive hunger, excessive thirst, excessive urination, heat intolerance, and polyuria. Heme:  Denies abnormal bruising, bleeding, enlarge lymph nodes, fevers, pallor, and skin discoloration. Allergy:  Denies hives or rash, itching eyes, persistent infections, seasonal allergies, and sneezing.  Physical Exam  General:  Well-developed,well-nourished,in no acute distress; alert,appropriate and cooperative throughout examination Head:  Normocephalic and atraumatic without obvious abnormalities. No apparent alopecia or balding. Eyes:  vision  grossly intact, pupils equal, pupils round, pupils reactive to light, and no injection.   Ears:  External ear exam  shows no significant lesions or deformities.  Otoscopic examination reveals clear canals, tympanic membranes are intact bilaterally without bulging, retraction, inflammation or discharge. Hearing is grossly normal bilaterally. Nose:  External nasal examination shows no deformity or inflammation. Nasal mucosa are pink and moist without lesions or exudates. Mouth:  Oral mucosa and oropharynx without lesions or exudates.  Teeth in good repair. Neck:  No deformities, masses, or tenderness noted. Breasts:  gyn Lungs:  Normal respiratory effort, chest expands symmetrically. Lungs are clear to auscultation, no crackles or wheezes. Heart:  normal rate and Grade  2 /6 systolic ejection murmur.   Abdomen:  Bowel sounds positive,abdomen soft and non-tender without masses, organomegaly or hernias noted. Rectal:  gyn Genitalia:  gyn Msk:  L foot pain no joint swelling, no joint warmth, no redness over joints, no joint instability, no crepitation, and bunion.   Pulses:  R posterior tibial normal, R dorsalis pedis normal, R carotid normal, L posterior tibial normal, L dorsalis pedis normal, and L carotid normal.   Extremities:  No clubbing, cyanosis, edema, or deformity noted with normal full range of motion of all joints.   Neurologic:  No cranial nerve deficits noted. Station and gait are normal. Plantar reflexes are down-going bilaterally. DTRs are symmetrical throughout. Sensory, motor and coordinative functions appear intact. Skin:  Intact without suspicious lesions or rashes Cervical Nodes:  No lymphadenopathy noted Psych:  Oriented X3, normally interactive, good eye contact, not anxious appearing, and not depressed appearing.     Impression & Recommendations:  Problem # 1:  PREOPERATIVE EXAMINATION (ICD-V72.84) labs pending as long as labs are normal we will clear her for surgery cardiology needs to clear her as well  Problem # 2:  SINUS BRADYCARDIA (ICD-427.81) Assessment: Improved +  pacmaker  Problem # 3:  HYPERLIPIDEMIA (ICD-272.4)  Labs Reviewed: SGOT: 25 (11/22/2009)   SGPT: 22 (11/22/2009)   HDL:47.10 (11/22/2009), 54.40 (09/21/2009)  LDL:86 (11/22/2009), 89 (09/21/2009)  Chol:151 (11/22/2009), 160 (09/21/2009)  Trig:88.0 (11/22/2009), 82.0 (09/21/2009)  Problem # 4:  HYPOTHYROIDISM (ICD-244.9)  Her updated medication list for this problem includes:    Synthroid 100 Mcg Tabs (Levothyroxine sodium) .Marland Kitchen... Take one tablet once daily  Labs Reviewed: TSH: 2.19 (11/22/2009)    HgBA1c: 5.6 (03/16/2008) Chol: 151 (11/22/2009)   HDL: 47.10 (11/22/2009)   LDL: 86 (11/22/2009)   TG: 88.0 (11/22/2009)  Problem # 5:  HYPOTHYROIDISM (ICD-244.9)  Her updated medication list for this problem includes:    Synthroid 100 Mcg Tabs (Levothyroxine sodium) .Marland Kitchen... Take one tablet once daily  Labs Reviewed: TSH: 2.19 (11/22/2009)    HgBA1c: 5.6 (03/16/2008) Chol: 151 (11/22/2009)   HDL: 47.10 (11/22/2009)   LDL: 86 (11/22/2009)   TG: 88.0 (11/22/2009)  Problem # 6:  HYPERTENSION (ICD-401.9)  Her updated medication list for this problem includes:    Hyzaar 100-25 Mg Tabs (Losartan potassium-hctz) .Marland Kitchen... 1 by mouth daily    Norvasc 10 Mg Tabs (Amlodipine besylate) .Marland Kitchen... 1 by mouth once daily  BP today: 130/84 Prior BP: 136/80 (11/16/2009)  Labs Reviewed: K+: 3.3 (11/22/2009) Creat: : 1.1 (11/22/2009)   Chol: 151 (11/22/2009)   HDL: 47.10 (11/22/2009)   LDL: 86 (11/22/2009)   TG: 88.0 (11/22/2009)  Problem # 7:  Hx of DIABETES MELLITUS (ICD-250.00) Assessment: Improved  since weight loss DM dx taken off problem list---pt has recently gained about 40 lbs  Her updated medication  list for this problem includes:    Hyzaar 100-25 Mg Tabs (Losartan potassium-hctz) .Marland Kitchen... 1 by mouth daily  Labs Reviewed: Creat: 1.1 (11/22/2009)    Reviewed HgBA1c results: 5.6 (03/16/2008)  5.6 (09/30/2007)  Complete Medication List: 1)  Synthroid 100 Mcg Tabs (Levothyroxine sodium) .... Take  one tablet once daily 2)  Protonix 40 Mg Tbec (Pantoprazole sodium) .... Take one tablet daily 3)  Hyzaar 100-25 Mg Tabs (Losartan potassium-hctz) .Marland Kitchen.. 1 by mouth daily 4)  Cyclobenzaprine Hcl 5 Mg Tabs (Cyclobenzaprine hcl) .... Take 1tab as needed bedtime 5)  Vitamin D 81191 Unit Caps (Ergocalciferol) .... Take 1 tab by mouth weekly 6)  Klor-con M20 20 Meq Cr-tabs (Potassium chloride crys cr) .Marland Kitchen.. 1 by mouth three times a day 7)  Norvasc 10 Mg Tabs (Amlodipine besylate) .Marland Kitchen.. 1 by mouth once daily 8)  B-12 Dots 500 Mcg Subl (Cyanocobalamin) .Marland Kitchen.. 1 by mouth qd 9)  Citracal Petites/vitamin D 200-250 Mg-unit Tabs (Calcium citrate-vitamin d) .... 2 by mouth two times a day 10)  Gabapentin 300 Mg Caps (Gabapentin) .... Take 1 by mouth at bedtime. 11)  Claritin 10 Mg Tabs (Loratadine) .Marland Kitchen.. 1 by mouth once daily as needed 12)  Restoril 15 Mg Caps (Temazepam) .Marland Kitchen.. 1 by mouth q hs. 13)  Vitamin D (ergocalciferol) 50000 Unit Caps (Ergocalciferol) .Marland Kitchen.. 1 by mouth once weekly. 14)  Vitamin D3 2000 Unit Caps (Cholecalciferol) .Marland Kitchen.. 1 by mouth daily.  Other Orders: Nutrition Referral (Nutrition)  Patient Instructions: 1)  fasting labs  V70.0  250.00 272.4  bmp, hep, hgba1c, lipid, cbcd, tsh

## 2010-08-28 NOTE — Assessment & Plan Note (Signed)
Summary: 3RD B-12 SHOT///SPH  Nurse Visit   Allergies: 1)  ! Demerol 2)  ! Sulfa  Medication Administration  Injection # 1:    Medication: Vit B12 1000 mcg    Diagnosis: B12 DEFICIENCY (ICD-266.2)    Route: IM    Site: L deltoid    Exp Date: 10/2011    Lot #: [id number]    Mfr: [employer]    Patient tolerated injection without complications    Given by: Army Fossa CMA (Dec 21, 2009 4:30 PM)  Orders Added: 1)  Admin of Therapeutic Inj  intramuscular or subcutaneous [96372] 2)  Vit B12 1000 mcg [J3420]

## 2010-08-28 NOTE — Assessment & Plan Note (Signed)
Summary: 1st b-12 inj/cbs  Nurse Visit   Allergies: 1)  ! Demerol 2)  ! Sulfa  Medication Administration  Injection # 1:    Medication: Vit B12 1000 mcg    Diagnosis: B12 DEFICIENCY (ICD-266.2)    Route: IM    Site: L deltoid    Exp Date: 08/2011    Lot #: [id number]    Mfr: [employer]    Patient tolerated injection without complications    Given by: Army Fossa CMA (Dec 07, 2009 4:18 PM)  Orders Added: 1)  Admin of Therapeutic Inj  intramuscular or subcutaneous [96372] 2)  Vit B12 1000 mcg [J3420]

## 2010-08-28 NOTE — Assessment & Plan Note (Signed)
Summary: out of thyriod medicine//lch   Vital Signs:  Patient profile:   60 year old female Height:      65 inches Weight:      228 pounds BMI:     38.08 Temp:     98.3 degrees F oral Pulse rate:   60 / minute Pulse rhythm:   regular BP sitting:   140 / 82  (left arm) Cuff size:   regular  Vitals Entered By: Army Fossa CMA (September 21, 2009 11:21 AM) CC: Refill meds, having postnasal drip. , URI symptoms Comments Refills on synthroid and gabapentin   History of Present Illness: Pt needs labs and refills.        This is a 60 year old woman who presents with URI symptoms.  The symptoms began 1 week ago.  The patient complains of clear nasal discharge, but denies nasal congestion, purulent nasal discharge, sore throat, dry cough, productive cough, earache, and sick contacts.  The patient denies fever, low-grade fever (<100.5 degrees), fever of 100.5-103 degrees, fever of 103.1-104 degrees, fever to >104 degrees, stiff neck, dyspnea, wheezing, rash, vomiting, diarrhea, use of an antipyretic, and response to antipyretic.  The patient denies itchy watery eyes, itchy throat, sneezing, seasonal symptoms, response to antihistamine, headache, muscle aches, and severe fatigue.  The patient denies the following risk factors for Strep sinusitis: unilateral facial pain, unilateral nasal discharge, poor response to decongestant, double sickening, tooth pain, Strep exposure, tender adenopathy, and absence of cough.  No otc meds.  Current Medications (verified): 1)  Synthroid 100 Mcg  Tabs (Levothyroxine Sodium) .... Take One Tablet Once Daily 2)  Protonix 40 Mg  Tbec (Pantoprazole Sodium) .... Take One Tablet Daily 3)  Cozaar 100 Mg Tabs (Losartan Potassium) .... Take 1 Tab Once Daily 4)  Ambien 10 Mg  Tabs (Zolpidem Tartrate) .Marland Kitchen.. 1 By Mouth At Bedtime 5)  Cyclobenzaprine Hcl 5 Mg  Tabs (Cyclobenzaprine Hcl) .... Take 1tab As Needed Bedtime 6)  Vitamin D 16109 Unit Caps (Ergocalciferol) ....  Take 1 Tab By Mouth Weekly 7)  Klor-Con M20 20 Meq Cr-Tabs (Potassium Chloride Crys Cr) .Marland Kitchen.. 1 By Mouth Once Daily 8)  Norvasc 10 Mg Tabs (Amlodipine Besylate) .Marland Kitchen.. 1 By Mouth Once Daily 9)  Vitamin D 60454 Unit Caps (Ergocalciferol) .Marland Kitchen.. 1 By Mouth Qweek 10)  B-12 Dots 500 Mcg Subl (Cyanocobalamin) .Marland Kitchen.. 1 By Mouth Qd 11)  Citracal Petites/vitamin D 200-250 Mg-Unit Tabs (Calcium Citrate-Vitamin D) .... 2 By Mouth Two Times A Day 12)  Gabapentin 300 Mg Caps (Gabapentin) .... Take 1 By Mouth At Bedtime. 13)  Claritin 10 Mg Tabs (Loratadine) .Marland Kitchen.. 1 By Mouth Once Daily As Needed  Allergies (verified): 1)  ! Demerol 2)  ! Sulfa  Past History:  Past medical, surgical, family and social histories (including risk factors) reviewed for relevance to current acute and chronic problems.  Past Medical History: Reviewed history from 08/11/2008 and no changes required. Hyperlipidemia Diabetes mellitus, type II Hypertension Asthma Allergies Hypothyroidism OSA, AHI 12.5 from [facility] on 06/28/08  Past Surgical History: Reviewed history from 05/13/2008 and no changes required. 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 Mother - COPD Sister - colon cancer  Social History: Reviewed history from 05/13/2008 and no changes required. Occupation:office specialist Divorced Never Smoked Alcohol use-no  Review of Systems      See HPI  Physical Exam  General:  Well-developed,well-nourished,in no acute distress; alert,appropriate and cooperative  throughout examination Ears:  External ear exam shows no significant lesions or deformities.  Otoscopic examination reveals clear canals, tympanic membranes are intact bilaterally without bulging, retraction, inflammation or discharge. Hearing is grossly normal bilaterally. Nose:  External nasal examination shows no deformity or inflammation. Nasal mucosa are  pink and moist without lesions or exudates. Mouth:  Oral mucosa and oropharynx without lesions or exudates.  Teeth in good repair. Neck:  No deformities, masses, or tenderness noted. Lungs:  Normal respiratory effort, chest expands symmetrically. Lungs are clear to auscultation, no crackles or wheezes. Heart:  normal rate and no murmur.   Psych:  Oriented X3 and normally interactive.     Impression & Recommendations:  Problem # 1:  HYPOTHYROIDISM (ICD-244.9)  Her updated medication list for this problem includes:    Synthroid 100 Mcg Tabs (Levothyroxine sodium) .Marland Kitchen... Take one tablet once daily  Orders: Venipuncture (78469) TLB-Lipid Panel (80061-LIPID) TLB-BMP (Basic Metabolic Panel-BMET) (80048-METABOL) TLB-TSH (Thyroid Stimulating Hormone) (84443-TSH) TLB-Hepatic/Liver Function Pnl (80076-HEPATIC)  Problem # 2:  HYPERLIPIDEMIA (ICD-272.4)  Orders: Venipuncture (62952) TLB-Lipid Panel (80061-LIPID) TLB-BMP (Basic Metabolic Panel-BMET) (80048-METABOL) TLB-TSH (Thyroid Stimulating Hormone) (84443-TSH) TLB-Hepatic/Liver Function Pnl (80076-HEPATIC)  Problem # 3:  RHINITIS, ALLERGIC NEC (ICD-477.8)  Problem # 4:  HYPERTENSION (ICD-401.9)  Her updated medication list for this problem includes:    Cozaar 100 Mg Tabs (Losartan potassium) .Marland Kitchen... Take 1 tab once daily    Norvasc 10 Mg Tabs (Amlodipine besylate) .Marland Kitchen... 1 by mouth once daily  Orders: Venipuncture (84132) TLB-Lipid Panel (80061-LIPID) TLB-BMP (Basic Metabolic Panel-BMET) (80048-METABOL) TLB-TSH (Thyroid Stimulating Hormone) (84443-TSH) TLB-Hepatic/Liver Function Pnl (80076-HEPATIC)  Complete Medication List: 1)  Synthroid 100 Mcg Tabs (Levothyroxine sodium) .... Take one tablet once daily 2)  Protonix 40 Mg Tbec (Pantoprazole sodium) .... Take one tablet daily 3)  Cozaar 100 Mg Tabs (Losartan potassium) .... Take 1 tab once daily 4)  Ambien 10 Mg Tabs (Zolpidem tartrate) .Marland Kitchen.. 1 by mouth at bedtime 5)   Cyclobenzaprine Hcl 5 Mg Tabs (Cyclobenzaprine hcl) .... Take 1tab as needed bedtime 6)  Vitamin D 44010 Unit Caps (Ergocalciferol) .... Take 1 tab by mouth weekly 7)  Klor-con M20 20 Meq Cr-tabs (Potassium chloride crys cr) .Marland Kitchen.. 1 by mouth once daily 8)  Norvasc 10 Mg Tabs (Amlodipine besylate) .Marland Kitchen.. 1 by mouth once daily 9)  Vitamin D 27253 Unit Caps (Ergocalciferol) .Marland Kitchen.. 1 by mouth qweek 10)  B-12 Dots 500 Mcg Subl (Cyanocobalamin) .Marland Kitchen.. 1 by mouth qd 11)  Citracal Petites/vitamin D 200-250 Mg-unit Tabs (Calcium citrate-vitamin d) .... 2 by mouth two times a day 12)  Gabapentin 300 Mg Caps (Gabapentin) .... Take 1 by mouth at bedtime. 13)  Claritin 10 Mg Tabs (Loratadine) .Marland Kitchen.. 1 by mouth once daily as needed Prescriptions: CLARITIN 10 MG TABS (LORATADINE) 1 by mouth once daily as needed  #30 x 2   Entered and Authorized by:   Loreen Freud DO   Signed by:   Loreen Freud DO on 09/21/2009   Method used:   Electronically to        [facility] # [phone]* (retail)       [address]       [place], [place]  [address]       Ph: [phone]       Fax: [phone]   RxID:   [id number] GABAPENTIN 300 MG CAPS (GABAPENTIN) take 1 by mouth at bedtime.  #90 x 3  Entered and Authorized by:   Loreen Freud DO   Signed by:   Loreen Freud DO on 09/21/2009   Method used:   Electronically to        [facility]* (mail-order)             ,          Ph: [phone]       Fax: [phone]   RxID:   [id number]

## 2010-12-11 NOTE — Procedures (Signed)
Kathleen Jensen, Kathleen Jensen NO.:  [id number]   MEDICAL RECORD NO.:  [id number]          PATIENT TYPE:  OUT   LOCATION:  SLEEP CENTER                 FACILITY:  [facility]   PHYSICIAN:  Coralyn Helling, MD        DATE OF BIRTH:  Nov 21, 1950   DATE OF STUDY:  06/28/2008                            NOCTURNAL POLYSOMNOGRAM   REFERRING PHYSICIAN:   FACILITY:  [facility].   REFERRING PHYSICIAN:  Coralyn Helling, MD   INDICATION:  [name] is a 60 year old female who has a history of  diabetes, hypertension, and hypothyroidism.  She had problems with sleep  disruption with difficulty with sleep initiation, sleep maintenance and  symptoms suggestive of sleep apnea.  She is therefore refereed to sleep  after evaluation of hypersomnia with obstructive sleep apnea.   Height is 5 feet 4 inches, weight is 210 pounds.  BMI is 36.  Neck size  is 13.   EPWORTH SLEEPINESS SCORE:  3.   MEDICATIONS:  Synthroid, Protonix, Hyzaar, Ambien, cyclobenzaprine, and  Norvasc.  The patient took an Ambien at 10 p.m. on the night of the  study.   SLEEP ARCHITECTURE:  Total recording time was 402 minutes.  Total sleep  time was 337 minutes.  Sleep efficiency was 84%.  Sleep latency was 46  minutes which was long.  REM latency was 101 minutes.  The patient was  observed in all stages of sleep.  The patient slept exclusively in the  supine position.   RESPIRATORY DATA:  The average respiratory rate was 20.  Moderate  snoring was noted by the technician.  The overall apnea-hypopnea index  was 12.5.  The respiratory disturbance index was 20.7.  The events were  exclusively obstructive in nature.  The REM apnea-hypopnea index was  34.9.  The non-REM apnea-hypopnea index was 8.1.   OXYGEN DATA:  The baseline oxygenation was 98%.  The oxygen saturation  rate was 82%.  The patient spent a total of 10 minutes with an oxygen  saturation less than 90%, and 2.4 minutes with an oxygen saturation less  than 88%.   CARDIAC DATA:  The average heart rate is 46 and the rhythm strip showed  normal sinus rhythm with occasional PVCs and sinus arrhythmia.   MOVEMENT-PARASOMNIA:  The periodic limb movement index was 0 and the  patient had no restroom trips.   IMPRESSIONS-RECOMMENDATIONS:  This study showed evidence for mild to  moderate obstructive sleep apnea as demonstrated by an apnea-hypopnea  index of 12.5.  She did have a significant REM effect to her sleep  apnea.   In addition to diet, exercise, and weight reduction, a consideration  should be given to having the patient undergo additional therapy with  either CPAP, oral appliance, or surgical intervention.      Coralyn Helling, MD  Diplomat, American Board of Sleep Medicine  Electronically Signed     VS/MEDQ  D:  06/30/2008 12:36:03  T:  07/01/2008 02:02:32  Job:  [id number]

## 2010-12-27 ENCOUNTER — Other Ambulatory Visit: Payer: Self-pay | Admitting: Family Medicine

## 2011-03-04 ENCOUNTER — Other Ambulatory Visit: Payer: Self-pay | Admitting: Family Medicine

## 2011-03-04 NOTE — Telephone Encounter (Signed)
Letter mailed     KP 

## 2011-06-02 ENCOUNTER — Other Ambulatory Visit: Payer: Self-pay | Admitting: Family Medicine

## 2011-08-31 ENCOUNTER — Other Ambulatory Visit: Payer: Self-pay | Admitting: Family Medicine

## 2011-09-02 NOTE — Telephone Encounter (Signed)
Letter mailed     KP 

## 2011-11-15 ENCOUNTER — Other Ambulatory Visit: Payer: Self-pay | Admitting: Family Medicine

## 2011-11-15 NOTE — Telephone Encounter (Signed)
Pt last seen in 2011, OK to refill? Please advise.

## 2013-07-29 LAB — HM DEXA SCAN

## 2013-07-29 LAB — HM COLONOSCOPY: HM Colonoscopy: NORMAL

## 2014-07-04 ENCOUNTER — Ambulatory Visit (INDEPENDENT_AMBULATORY_CARE_PROVIDER_SITE_OTHER): Payer: BC Managed Care – PPO | Admitting: Diagnostic Neuroimaging

## 2014-07-04 ENCOUNTER — Encounter: Payer: Self-pay | Admitting: Diagnostic Neuroimaging

## 2014-07-04 VITALS — BP 154/109 | HR 85 | Ht 64.0 in | Wt 223.8 lb

## 2014-07-04 DIAGNOSIS — M5442 Lumbago with sciatica, left side: Secondary | ICD-10-CM

## 2014-07-04 DIAGNOSIS — M5441 Lumbago with sciatica, right side: Secondary | ICD-10-CM

## 2014-07-04 NOTE — Progress Notes (Signed)
[facility]  PATIENT: Kathleen Jensen DOB: [birth year]  REFERRING CLINICIAN: Everly HISTORY FROM: patient  REASON FOR VISIT: new consult    HISTORICAL  CHIEF COMPLAINT:  Chief Complaint  Patient presents with  . Pain  . Back Pain    HISTORY OF PRESENT ILLNESS:   63 year old left-handed female with hypertension, diabetes, sleep apnea, B12 deficiency, hyperlipidemia, gastric bypass surgery, cervical spine surgery, here for evaluation of low back pain.  May 2015 patient had onset of bilateral buttock pain radiating down the back of the legs down to the level of the knees. Patient had EMG, ultrasound of lower extremities which were normal. Patient was treated with hydrocodone and oxycodone with mild relief. Patient has been to physical therapy for the past 1 month. Patient referred to me for second opinion. Patient was scheduled to have CT scan of lumbar spine (cannot have MRI due to pacemaker) but patient declined due to financial burden.   REVIEW OF SYSTEMS: Full 14 system review of systems performed and notable only for aching muscles.  ALLERGIES: Allergies  Allergen Reactions  . Meperidine Hcl   . Sulfonamide Derivatives     HOME MEDICATIONS: Outpatient Prescriptions Prior to Visit  Medication Sig Dispense Refill  . gabapentin (NEURONTIN) 300 MG capsule TAKE 1 CAPSULE AT BEDTIME (OFFICE VISIT DUE NOW) 90 capsule 0   No facility-administered medications prior to visit.    PAST MEDICAL HISTORY: Past Medical History  Diagnosis Date  . Hypertension     PAST SURGICAL HISTORY: Past Surgical History  Procedure Laterality Date  . Tonsillectomy    . Gastric bypass    . Cervical spine surgery      C4    FAMILY HISTORY: Family History  Problem Relation Age of Onset  . High blood pressure Mother   . Diabetes Mother   . Diabetes Father   . Cancer Sister     SOCIAL HISTORY:  History   Social History  . Marital Status: Divorced    Spouse  Name: N/A    Number of Children: 3  . Years of Education: 4744yrs   Occupational History  . Retired Other   Social History Main Topics  . Smoking status: Former Smoker    Quit date: 07/30/1979  . Smokeless tobacco: Not on file  . Alcohol Use: No  . Drug Use: No  . Sexual Activity: Not on file   Other Topics Concern  . Not on file   Social History Narrative   Patient lives at home alone.   Caffeine Use: 1 cup daily     PHYSICAL EXAM  Filed Vitals:   07/04/14 1112  BP: 154/109  Pulse: 85  Height: 5\' 4"  (1.626 m)  Weight: 223 lb 12.8 oz (101.515 kg)    Body mass index is 38.4 kg/(m^2).   Visual Acuity Screening   Right eye Left eye Both eyes  Without correction: 20/30 20/30   With correction:       No flowsheet data found.  GENERAL EXAM: Patient is in no distress; well developed, nourished and groomed; neck is supple  CARDIOVASCULAR: Regular rate and rhythm, no murmurs, no carotid bruits  NEUROLOGIC: MENTAL STATUS: awake, alert, oriented to person, place and time, recent and remote memory intact, normal attention and concentration, language fluent, comprehension intact, naming intact, fund of knowledge appropriate CRANIAL NERVE: no papilledema on fundoscopic exam, pupils equal and reactive to light, visual fields full to confrontation, extraocular muscles intact, no nystagmus, facial sensation and strength symmetric, hearing  intact, palate elevates symmetrically, uvula midline, shoulder shrug symmetric, tongue midline. MOTOR: normal bulk and tone, full strength in the BUE, BLE SENSORY: normal and symmetric to light touch, pinprick, temperature, vibration  COORDINATION: finger-nose-finger, fine finger movements, heel-shin normal REFLEXES: BUE 3, KNEES 3, ANKLES 1 GAIT/STATION: narrow based gait; ANTALGIC GAIT; romberg is negative    DIAGNOSTIC DATA (LABS, IMAGING, TESTING) - I reviewed patient records, labs, notes, testing and imaging myself where  available.  Lab Results  Component Value Date   WBC 6.0 01/23/2010   HGB 14.2 01/23/2010   HCT 41.0 01/23/2010   MCV 91.7 01/23/2010   PLT 150.0 01/23/2010      Component Value Date/Time   NA 144 01/23/2010 1245   K 3.9 01/23/2010 1245   CL 108 01/23/2010 1245   CO2 30 01/23/2010 1245   GLUCOSE 72 01/23/2010 1245   GLUCOSE 122* 07/07/2006 1222   BUN 14 01/23/2010 1245   CREATININE 0.9 01/23/2010 1245   CALCIUM 9.2 01/23/2010 1245   PROT 7.4 01/23/2010 1245   ALBUMIN 3.9 01/23/2010 1245   AST 25 01/23/2010 1245   ALT 20 01/23/2010 1245   ALKPHOS 84 01/23/2010 1245   BILITOT 0.6 01/23/2010 1245   GFRNONAA 78.26 01/23/2010 1245   GFRAA 73 03/16/2008 1155   Lab Results  Component Value Date   CHOL 162 01/23/2010   HDL 45.30 01/23/2010   LDLCALC 101* 01/23/2010   TRIG 77.0 01/23/2010   CHOLHDL 4 01/23/2010   Lab Results  Component Value Date   HGBA1C 5.8 01/23/2010   Lab Results  Component Value Date   VITAMINB12 985* 01/04/2010   Lab Results  Component Value Date   TSH 1.74 01/23/2010      ASSESSMENT AND PLAN  63 y.o. year old female here with low back pain, bilateral buttock pain, radiating to bilateral legs. May represent lumbar radiculopathy versus spinal stenosis. Patient was advised to have CT of the lumbar spine, but patient declined due to financial burden. Currently on oxycodone and gabapentin. Currently in physical therapy.  PLAN: - continue current plan with medication and PT - may consider CT myelogram of lumbar spine in future if patient fails conservative mgmt - may follow up with Dr. Adelene IdlerFerraru or other pain mgmt clinic  Return for return to PCP.    Suanne MarkerVIKRAM R. PENUMALLI, MD 07/04/2014, 12:03 PM Certified in Neurology, Neurophysiology and Neuroimaging  [facility] [address], [address] [place], [place] [address] [phone]

## 2014-07-04 NOTE — Patient Instructions (Signed)
Follow up with pain management.  Continue physical therapy exercises.

## 2014-07-29 LAB — HM PAP SMEAR: HM Pap smear: NORMAL

## 2014-11-27 LAB — HM MAMMOGRAPHY: HM MAMMO: NORMAL (ref 0–4)

## 2015-01-09 DIAGNOSIS — N183 Chronic kidney disease, stage 3 unspecified: Secondary | ICD-10-CM | POA: Insufficient documentation

## 2015-01-09 DIAGNOSIS — Z950 Presence of cardiac pacemaker: Secondary | ICD-10-CM | POA: Insufficient documentation

## 2015-04-10 DIAGNOSIS — M545 Low back pain, unspecified: Secondary | ICD-10-CM | POA: Insufficient documentation

## 2015-04-10 DIAGNOSIS — G8929 Other chronic pain: Secondary | ICD-10-CM | POA: Insufficient documentation

## 2015-04-10 DIAGNOSIS — M4316 Spondylolisthesis, lumbar region: Secondary | ICD-10-CM | POA: Insufficient documentation

## 2015-04-10 DIAGNOSIS — M47816 Spondylosis without myelopathy or radiculopathy, lumbar region: Secondary | ICD-10-CM | POA: Insufficient documentation

## 2015-04-10 DIAGNOSIS — M5136 Other intervertebral disc degeneration, lumbar region: Secondary | ICD-10-CM | POA: Insufficient documentation

## 2015-10-19 ENCOUNTER — Ambulatory Visit: Payer: Self-pay | Admitting: Family Medicine

## 2016-01-24 ENCOUNTER — Telehealth: Payer: Self-pay | Admitting: Behavioral Health

## 2016-01-24 ENCOUNTER — Encounter: Payer: Self-pay | Admitting: Behavioral Health

## 2016-01-25 NOTE — Telephone Encounter (Signed)
Pre-Visit Call completed with patient and chart updated.   Pre-Visit Info documented in Specialty Comments under SnapShot.    

## 2016-01-26 ENCOUNTER — Encounter: Payer: Self-pay | Admitting: Family Medicine

## 2016-01-26 ENCOUNTER — Ambulatory Visit (INDEPENDENT_AMBULATORY_CARE_PROVIDER_SITE_OTHER): Payer: Medicare Other | Admitting: Family Medicine

## 2016-01-26 VITALS — BP 135/85 | HR 58 | Temp 97.7°F | Ht 63.75 in | Wt 207.8 lb

## 2016-01-26 DIAGNOSIS — I10 Essential (primary) hypertension: Secondary | ICD-10-CM | POA: Diagnosis not present

## 2016-01-26 DIAGNOSIS — M4300 Spondylolysis, site unspecified: Secondary | ICD-10-CM | POA: Insufficient documentation

## 2016-01-26 DIAGNOSIS — E079 Disorder of thyroid, unspecified: Secondary | ICD-10-CM | POA: Insufficient documentation

## 2016-01-26 DIAGNOSIS — Z1239 Encounter for other screening for malignant neoplasm of breast: Secondary | ICD-10-CM

## 2016-01-26 DIAGNOSIS — E2839 Other primary ovarian failure: Secondary | ICD-10-CM | POA: Diagnosis not present

## 2016-01-26 DIAGNOSIS — E039 Hypothyroidism, unspecified: Secondary | ICD-10-CM

## 2016-01-26 DIAGNOSIS — Z950 Presence of cardiac pacemaker: Secondary | ICD-10-CM | POA: Insufficient documentation

## 2016-01-26 DIAGNOSIS — J45909 Unspecified asthma, uncomplicated: Secondary | ICD-10-CM | POA: Insufficient documentation

## 2016-01-26 DIAGNOSIS — Z9889 Other specified postprocedural states: Secondary | ICD-10-CM | POA: Insufficient documentation

## 2016-01-26 DIAGNOSIS — G4700 Insomnia, unspecified: Secondary | ICD-10-CM

## 2016-01-26 LAB — CBC WITH DIFFERENTIAL/PLATELET
Basophils Absolute: 0 10*3/uL (ref 0.0–0.1)
Basophils Relative: 0.6 % (ref 0.0–3.0)
EOS ABS: 0.2 10*3/uL (ref 0.0–0.7)
Eosinophils Relative: 3.6 % (ref 0.0–5.0)
HCT: 40.6 % (ref 36.0–46.0)
HEMOGLOBIN: 13.7 g/dL (ref 12.0–15.0)
Lymphocytes Relative: 43.1 % (ref 12.0–46.0)
Lymphs Abs: 2.3 10*3/uL (ref 0.7–4.0)
MCHC: 33.7 g/dL (ref 30.0–36.0)
MCV: 93.8 fl (ref 78.0–100.0)
MONO ABS: 0.5 10*3/uL (ref 0.1–1.0)
Monocytes Relative: 8.5 % (ref 3.0–12.0)
Neutro Abs: 2.4 10*3/uL (ref 1.4–7.7)
Neutrophils Relative %: 44.2 % (ref 43.0–77.0)
Platelets: 190 10*3/uL (ref 150.0–400.0)
RBC: 4.33 Mil/uL (ref 3.87–5.11)
RDW: 14.3 % (ref 11.5–15.5)
WBC: 5.4 10*3/uL (ref 4.0–10.5)

## 2016-01-26 LAB — MICROALBUMIN / CREATININE URINE RATIO
Creatinine,U: 211.3 mg/dL
Microalb Creat Ratio: 0.5 mg/g (ref 0.0–30.0)
Microalb, Ur: 1 mg/dL (ref 0.0–1.9)

## 2016-01-26 LAB — LIPID PANEL
CHOLESTEROL: 150 mg/dL (ref 0–200)
HDL: 46.3 mg/dL (ref >39.00)
LDL Cholesterol: 87 mg/dL (ref 0–99)
NonHDL: 103.82
TRIGLYCERIDES: 86 mg/dL (ref 0.0–149.0)
Total CHOL/HDL Ratio: 3
VLDL: 17.2 mg/dL (ref 0.0–40.0)

## 2016-01-26 LAB — COMPREHENSIVE METABOLIC PANEL
ALBUMIN: 3.8 g/dL (ref 3.5–5.2)
ALT: 71 U/L — ABNORMAL HIGH (ref 0–35)
AST: 81 U/L — AB (ref 0–37)
Alkaline Phosphatase: 107 U/L (ref 39–117)
BUN: 23 mg/dL (ref 6–23)
CHLORIDE: 105 meq/L (ref 96–112)
CO2: 30 mEq/L (ref 19–32)
CREATININE: 1.11 mg/dL (ref 0.40–1.20)
Calcium: 9.2 mg/dL (ref 8.4–10.5)
GFR: 63.35 mL/min (ref >60.00)
Glucose, Bld: 92 mg/dL (ref 70–99)
Potassium: 3.3 mEq/L — ABNORMAL LOW (ref 3.5–5.1)
SODIUM: 140 meq/L (ref 135–145)
TOTAL PROTEIN: 7.9 g/dL (ref 6.0–8.3)
Total Bilirubin: 0.6 mg/dL (ref 0.2–1.2)

## 2016-01-26 LAB — POCT URINALYSIS DIPSTICK
Blood, UA: NEGATIVE
Glucose, UA: NEGATIVE
KETONES UA: NEGATIVE
LEUKOCYTES UA: NEGATIVE
NITRITE UA: NEGATIVE
PROTEIN UA: NEGATIVE
Spec Grav, UA: >=1.03
Urobilinogen, UA: 1
pH, UA: 5.5

## 2016-01-26 LAB — HEMOGLOBIN A1C: HEMOGLOBIN A1C: 5.8 % (ref 4.6–6.5)

## 2016-01-26 LAB — TSH: TSH: 1.24 u[IU]/mL (ref 0.35–4.50)

## 2016-01-26 MED ORDER — AMLODIPINE BESYLATE 10 MG PO TABS: 10.0000 mg | ORAL_TABLET | Freq: Every day | ORAL | Status: DC

## 2016-01-26 MED ORDER — LOSARTAN POTASSIUM 100 MG PO TABS: 100.0000 mg | ORAL_TABLET | Freq: Every day | ORAL | Status: DC

## 2016-01-26 MED ORDER — SUVOREXANT 10 MG PO TABS: 1.0000 | ORAL_TABLET | Freq: Every evening | ORAL | Status: DC | PRN

## 2016-01-26 NOTE — Progress Notes (Signed)
[name], female    DOB: 04-28-1951  Age: 65 y.o. MRN: [id number]    Subjective:  Subjective HPI Kathleen Jensen presents for f/u bp and thyroid.  Pt sees pain management for her back.  Her DM resolved when she had gastric bypass surgery.    Review of Systems  Constitutional: Negative for diaphoresis, appetite change, fatigue and unexpected weight change.  Eyes: Negative for pain, redness and visual disturbance.  Respiratory: Negative for cough, chest tightness, shortness of breath and wheezing.   Cardiovascular: Negative for chest pain, palpitations and leg swelling.  Endocrine: Negative for cold intolerance, heat intolerance, polydipsia, polyphagia and polyuria.  Genitourinary: Negative for dysuria, frequency and difficulty urinating.  Neurological: Negative for dizziness, light-headedness, numbness and headaches.    History Past Medical History  Diagnosis Date  . Hypertension   . Arthritis   . Diabetes mellitus without complication (HCC)     resolved after gastric bypass    She has past surgical history that includes Tonsillectomy; Gastric bypass (03/2007); Cervical spine surgery; and Cesarean section.   Her family history includes Cancer in her brother and sister; Diabetes in her father and mother; High blood pressure in her mother; Stroke in her father.She reports that she quit smoking about 36 years ago. She does not have any smokeless tobacco history on file. She reports that she does not drink alcohol or use illicit drugs.  Current Outpatient Prescriptions on File Prior to Visit  Medication Sig Dispense Refill  . aspirin 81 MG tablet Take 81 mg by mouth daily.    Marland Kitchen. spironolactone (ALDACTONE) 25 MG tablet Take 0.5 tablets by mouth daily.  0  . zolpidem (AMBIEN) 10 MG tablet Take 1 tablet by mouth daily.  0   No current facility-administered medications on file prior to visit.     Objective:  Objective Physical Exam  Constitutional: She is oriented to  person, place, and time. She appears well-developed and well-nourished.  HENT:  Head: Normocephalic and atraumatic.  Eyes: Conjunctivae and EOM are normal.  Neck: Normal range of motion. Neck supple. No JVD present. Carotid bruit is not present. No thyromegaly present.  Cardiovascular: Normal rate, regular rhythm and normal heart sounds.   No murmur heard. Pulmonary/Chest: Effort normal and breath sounds normal. No respiratory distress. She has no wheezes. She has no rales. She exhibits no tenderness.  Musculoskeletal: She exhibits no edema.  Neurological: She is alert and oriented to person, place, and time.  Psychiatric: She has a normal mood and affect. Her behavior is normal.  Nursing note and vitals reviewed.  BP 135/85 mmHg  Pulse 58  Temp(Src) 97.7 F (36.5 C) (Oral)  Ht 5' 3.75" (1.619 m)  Wt 207 lb 12.8 oz (94.257 kg)  BMI 35.96 kg/m2  SpO2 98% Wt Readings from Last 3 Encounters:  01/26/16 207 lb 12.8 oz (94.257 kg)  07/04/14 223 lb 12.8 oz (101.515 kg)  01/18/10 233 lb (105.688 kg)     Lab Results  Component Value Date   WBC 5.4 01/26/2016   HGB 13.7 01/26/2016   HCT 40.6 01/26/2016   PLT 190.0 01/26/2016   GLUCOSE 92 01/26/2016   CHOL 150 01/26/2016   TRIG 86.0 01/26/2016   HDL 46.30 01/26/2016   LDLCALC 87 01/26/2016   ALT 71* 01/26/2016   AST 81* 01/26/2016   NA 140 01/26/2016   K 3.3* 01/26/2016   CL 105 01/26/2016   CREATININE 1.11 01/26/2016   BUN 23 01/26/2016   CO2 30 01/26/2016  TSH 1.24 01/26/2016   HGBA1C 5.8 01/26/2016   MICROALBUR 1.0 01/26/2016    No results found.   Assessment & Plan:  Plan I have discontinued Ms. Picou's gabapentin and oxyCODONE. I have also changed her losartan and amLODipine. Additionally, I am having her start on Suvorexant. Lastly, I am having her maintain her zolpidem, spironolactone, aspirin, levothyroxine, traMADol, albuterol, cyclobenzaprine, and Vitamin D3.  Meds ordered this encounter  Medications  .  levothyroxine (SYNTHROID, LEVOTHROID) 100 MCG tablet    Sig: Take 1 tablet by mouth daily.  . traMADol (ULTRAM) 50 MG tablet    Sig: Take 50 mg by mouth every 8 (eight) hours as needed.  Marland Kitchen. albuterol (PROVENTIL HFA;VENTOLIN HFA) 108 (90 Base) MCG/ACT inhaler    Sig: Inhale 2 puffs into the lungs every 6 (six) hours as needed.  . cyclobenzaprine (FLEXERIL) 10 MG tablet    Sig: Take 10 mg by mouth 3 (three) times daily as needed.  . Cholecalciferol (VITAMIN D3) 1000 units CAPS    Sig: Take by mouth.  . losartan (COZAAR) 100 MG tablet    Sig: Take 1 tablet (100 mg total) by mouth daily.    Dispense:  90 tablet    Refill:  1  . Suvorexant (BELSOMRA) 10 MG TABS    Sig: Take 1 tablet by mouth at bedtime as needed.    Dispense:  30 tablet    Refill:  5  . amLODipine (NORVASC) 10 MG tablet    Sig: Take 1 tablet (10 mg total) by mouth daily.    Dispense:  90 tablet    Refill:  1    Problem List Items Addressed This Visit    None    Visit Diagnoses    Essential hypertension    -  Primary    Relevant Medications    losartan (COZAAR) 100 MG tablet    amLODipine (NORVASC) 10 MG tablet    Other Relevant Orders    Lipid panel (Completed)    Hemoglobin A1c (Completed)    CBC with Differential/Platelet (Completed)    Comprehensive metabolic panel (Completed)    Microalbumin / creatinine urine ratio (Completed)    POCT urinalysis dipstick (Completed)    Hypothyroidism, unspecified hypothyroidism type        Relevant Medications    levothyroxine (SYNTHROID, LEVOTHROID) 100 MCG tablet    Other Relevant Orders    Lipid panel (Completed)    Hemoglobin A1c (Completed)    CBC with Differential/Platelet (Completed)    Comprehensive metabolic panel (Completed)    TSH (Completed)    Breast cancer screening        Relevant Orders    MM DIGITAL SCREENING BILATERAL    Estrogen deficiency        Relevant Orders    DG Bone Density    Insomnia        Relevant Medications    Suvorexant  (BELSOMRA) 10 MG TABS       Follow-up: Return in about 3 months (around 04/27/2016), or if symptoms worsen or fail to improve, for annual exam, fasting.  Donato SchultzYvonne R Lowne Chase, DO

## 2016-01-26 NOTE — Patient Instructions (Signed)
Hypertension Hypertension, commonly called high blood pressure, is when the force of blood pumping through your arteries is too strong. Your arteries are the blood vessels that carry blood from your heart throughout your body. A blood pressure reading consists of a higher number over a lower number, such as 110/72. The higher number (systolic) is the pressure inside your arteries when your heart pumps. The lower number (diastolic) is the pressure inside your arteries when your heart relaxes. Ideally you want your blood pressure below 120/80. Hypertension forces your heart to work harder to pump blood. Your arteries may become narrow or stiff. Having untreated or uncontrolled hypertension can cause heart attack, stroke, kidney disease, and other problems. RISK FACTORS Some risk factors for high blood pressure are controllable. Others are not.  Risk factors you cannot control include:   Race. You may be at higher risk if you are African American.  Age. Risk increases with age.  Gender. Men are at higher risk than women before age 45 years. After age 65, women are at higher risk than men. Risk factors you can control include:  Not getting enough exercise or physical activity.  Being overweight.  Getting too much fat, sugar, calories, or salt in your diet.  Drinking too much alcohol. SIGNS AND SYMPTOMS Hypertension does not usually cause signs or symptoms. Extremely high blood pressure (hypertensive crisis) may cause headache, anxiety, shortness of breath, and nosebleed. DIAGNOSIS To check if you have hypertension, your health care provider will measure your blood pressure while you are seated, with your arm held at the level of your heart. It should be measured at least twice using the same arm. Certain conditions can cause a difference in blood pressure between your right and left arms. A blood pressure reading that is higher than normal on one occasion does not mean that you need treatment. If  it is not clear whether you have high blood pressure, you may be asked to return on a different day to have your blood pressure checked again. Or, you may be asked to monitor your blood pressure at home for 1 or more weeks. TREATMENT Treating high blood pressure includes making lifestyle changes and possibly taking medicine. Living a healthy lifestyle can help lower high blood pressure. You may need to change some of your habits. Lifestyle changes may include:  Following the DASH diet. This diet is high in fruits, vegetables, and whole grains. It is low in salt, red meat, and added sugars.  Keep your sodium intake below 2,300 mg per day.  Getting at least 30-45 minutes of aerobic exercise at least 4 times per week.  Losing weight if necessary.  Not smoking.  Limiting alcoholic beverages.  Learning ways to reduce stress. Your health care provider may prescribe medicine if lifestyle changes are not enough to get your blood pressure under control, and if one of the following is true:  You are 18-59 years of age and your systolic blood pressure is above 140.  You are 60 years of age or older, and your systolic blood pressure is above 150.  Your diastolic blood pressure is above 90.  You have diabetes, and your systolic blood pressure is over 140 or your diastolic blood pressure is over 90.  You have kidney disease and your blood pressure is above 140/90.  You have heart disease and your blood pressure is above 140/90. Your personal target blood pressure may vary depending on your medical conditions, your age, and other factors. HOME CARE INSTRUCTIONS    Have your blood pressure rechecked as directed by your health care provider.   Take medicines only as directed by your health care provider. Follow the directions carefully. Blood pressure medicines must be taken as prescribed. The medicine does not work as well when you skip doses. Skipping doses also puts you at risk for  problems.  Do not smoke.   Monitor your blood pressure at home as directed by your health care provider. SEEK MEDICAL CARE IF:   You think you are having a reaction to medicines taken.  You have recurrent headaches or feel dizzy.  You have swelling in your ankles.  You have trouble with your vision. SEEK IMMEDIATE MEDICAL CARE IF:  You develop a severe headache or confusion.  You have unusual weakness, numbness, or feel faint.  You have severe chest or abdominal pain.  You vomit repeatedly.  You have trouble breathing. MAKE SURE YOU:   Understand these instructions.  Will watch your condition.  Will get help right away if you are not doing well or get worse.   This information is not intended to replace advice given to you by your health care provider. Make sure you discuss any questions you have with your health care provider.   Document Released: 07/15/2005 Document Revised: 11/29/2014 Document Reviewed: 05/07/2013 Elsevier Interactive Patient Education 2016 Elsevier Inc.  

## 2016-01-26 NOTE — Progress Notes (Signed)
Pre visit review using our clinic review tool, if applicable. No additional management support is needed unless otherwise documented below in the visit note. 

## 2016-01-28 NOTE — Assessment & Plan Note (Signed)
Stable , cont losartan and norvasc

## 2016-01-29 ENCOUNTER — Other Ambulatory Visit: Payer: Self-pay

## 2016-01-29 DIAGNOSIS — R945 Abnormal results of liver function studies: Principal | ICD-10-CM

## 2016-01-29 DIAGNOSIS — R748 Abnormal levels of other serum enzymes: Secondary | ICD-10-CM

## 2016-01-29 DIAGNOSIS — R7989 Other specified abnormal findings of blood chemistry: Secondary | ICD-10-CM

## 2016-02-15 DIAGNOSIS — M533 Sacrococcygeal disorders, not elsewhere classified: Secondary | ICD-10-CM | POA: Diagnosis not present

## 2016-02-16 ENCOUNTER — Other Ambulatory Visit (INDEPENDENT_AMBULATORY_CARE_PROVIDER_SITE_OTHER): Payer: Medicare Other

## 2016-02-16 DIAGNOSIS — R748 Abnormal levels of other serum enzymes: Secondary | ICD-10-CM

## 2016-02-16 LAB — HEPATIC FUNCTION PANEL
ALK PHOS: 103 U/L (ref 39–117)
ALT: 40 U/L — AB (ref 0–35)
AST: 42 U/L — AB (ref 0–37)
Albumin: 3.8 g/dL (ref 3.5–5.2)
BILIRUBIN TOTAL: 0.6 mg/dL (ref 0.2–1.2)
Bilirubin, Direct: 0.2 mg/dL (ref 0.0–0.3)
Total Protein: 7.9 g/dL (ref 6.0–8.3)

## 2016-02-16 LAB — GAMMA GT: GGT: 19 U/L (ref 7–51)

## 2016-02-19 DIAGNOSIS — Z950 Presence of cardiac pacemaker: Secondary | ICD-10-CM | POA: Diagnosis not present

## 2016-03-06 ENCOUNTER — Telehealth: Payer: Self-pay | Admitting: Family Medicine

## 2016-03-06 NOTE — Telephone Encounter (Signed)
°  Relationship to patient: Self  Can be reached: [phone]  Pharmacy:  [id number] [place], [place] - [id number] [phone] (Phone) [phone] (Fax)   Reason for call:  Patient states that the Suvorexant [facility](BELSOMRA) 10 MG TABS [[id number] is not working for her. She does not sleep at night. Request to go back on Ambien.

## 2016-03-07 MED ORDER — ZOLPIDEM TARTRATE 5 MG PO TABS: 5.0000 mg | ORAL_TABLET | Freq: Every evening | ORAL | 0 refills | Status: DC | PRN

## 2016-03-07 NOTE — Telephone Encounter (Signed)
Rx printed, to PCP for signature. 

## 2016-03-07 NOTE — Telephone Encounter (Signed)
ambien 5 mg #15   1 po qhs prn----  No more then 3-4 x a week

## 2016-03-07 NOTE — Telephone Encounter (Signed)
She is on the lowest dose-- we can increase it-- if she has any left she can try taking 2

## 2016-03-07 NOTE — Telephone Encounter (Signed)
Called pt and notified her of PCP instructions. She stated that she would still rather go back on ambien than increase Belsomra because the [place] about twice [place] much [place]. Please advise.

## 2016-03-08 NOTE — Telephone Encounter (Signed)
Rx faxed to pharmacy last night by CMA. Patient notified.

## 2016-03-14 DIAGNOSIS — M4726 Other spondylosis with radiculopathy, lumbar region: Secondary | ICD-10-CM | POA: Diagnosis not present

## 2016-03-14 DIAGNOSIS — M4806 Spinal stenosis, lumbar region: Secondary | ICD-10-CM | POA: Diagnosis not present

## 2016-03-26 ENCOUNTER — Other Ambulatory Visit (HOSPITAL_BASED_OUTPATIENT_CLINIC_OR_DEPARTMENT_OTHER): Payer: Medicare Other

## 2016-03-26 ENCOUNTER — Inpatient Hospital Stay (HOSPITAL_BASED_OUTPATIENT_CLINIC_OR_DEPARTMENT_OTHER): Admission: RE | Admit: 2016-03-26 | Payer: Medicare Other | Source: Ambulatory Visit

## 2016-04-04 ENCOUNTER — Ambulatory Visit (HOSPITAL_BASED_OUTPATIENT_CLINIC_OR_DEPARTMENT_OTHER)
Admission: RE | Admit: 2016-04-04 | Discharge: 2016-04-04 | Disposition: A | Discharge status: Home / Self Care | Payer: Medicare Other | Source: Ambulatory Visit | Attending: Family Medicine | Admitting: Family Medicine

## 2016-04-04 ENCOUNTER — Encounter (HOSPITAL_BASED_OUTPATIENT_CLINIC_OR_DEPARTMENT_OTHER): Payer: Self-pay | Admitting: Radiology

## 2016-04-04 DIAGNOSIS — Z780 Asymptomatic menopausal state: Secondary | ICD-10-CM | POA: Insufficient documentation

## 2016-04-04 DIAGNOSIS — E559 Vitamin D deficiency, unspecified: Secondary | ICD-10-CM | POA: Insufficient documentation

## 2016-04-04 DIAGNOSIS — M85861 Other specified disorders of bone density and structure, right lower leg: Secondary | ICD-10-CM | POA: Diagnosis not present

## 2016-04-04 DIAGNOSIS — E2839 Other primary ovarian failure: Secondary | ICD-10-CM

## 2016-04-04 DIAGNOSIS — Z1239 Encounter for other screening for malignant neoplasm of breast: Secondary | ICD-10-CM | POA: Diagnosis not present

## 2016-04-04 DIAGNOSIS — Z79899 Other long term (current) drug therapy: Secondary | ICD-10-CM | POA: Insufficient documentation

## 2016-04-04 DIAGNOSIS — M85851 Other specified disorders of bone density and structure, right thigh: Secondary | ICD-10-CM | POA: Diagnosis not present

## 2016-04-04 DIAGNOSIS — Z1231 Encounter for screening mammogram for malignant neoplasm of breast: Secondary | ICD-10-CM | POA: Insufficient documentation

## 2016-04-04 DIAGNOSIS — M8588 Other specified disorders of bone density and structure, other site: Secondary | ICD-10-CM | POA: Diagnosis not present

## 2016-04-04 DIAGNOSIS — E039 Hypothyroidism, unspecified: Secondary | ICD-10-CM | POA: Insufficient documentation

## 2016-04-18 DIAGNOSIS — M545 Low back pain: Secondary | ICD-10-CM | POA: Diagnosis not present

## 2016-04-18 DIAGNOSIS — M5136 Other intervertebral disc degeneration, lumbar region: Secondary | ICD-10-CM | POA: Diagnosis not present

## 2016-04-18 DIAGNOSIS — M4806 Spinal stenosis, lumbar region: Secondary | ICD-10-CM | POA: Diagnosis not present

## 2016-04-29 ENCOUNTER — Encounter: Payer: Self-pay | Admitting: Family Medicine

## 2016-04-29 ENCOUNTER — Ambulatory Visit (INDEPENDENT_AMBULATORY_CARE_PROVIDER_SITE_OTHER): Payer: Medicare Other | Admitting: Family Medicine

## 2016-04-29 VITALS — BP 125/85 | HR 65 | Temp 98.7°F | Resp 16 | Ht 64.0 in | Wt 209.2 lb

## 2016-04-29 DIAGNOSIS — Z23 Encounter for immunization: Secondary | ICD-10-CM | POA: Diagnosis not present

## 2016-04-29 DIAGNOSIS — J4520 Mild intermittent asthma, uncomplicated: Secondary | ICD-10-CM

## 2016-04-29 DIAGNOSIS — M5442 Lumbago with sciatica, left side: Secondary | ICD-10-CM | POA: Diagnosis not present

## 2016-04-29 DIAGNOSIS — G8929 Other chronic pain: Secondary | ICD-10-CM | POA: Diagnosis not present

## 2016-04-29 DIAGNOSIS — R195 Other fecal abnormalities: Secondary | ICD-10-CM

## 2016-04-29 DIAGNOSIS — G4700 Insomnia, unspecified: Secondary | ICD-10-CM | POA: Diagnosis not present

## 2016-04-29 DIAGNOSIS — I10 Essential (primary) hypertension: Secondary | ICD-10-CM | POA: Diagnosis not present

## 2016-04-29 DIAGNOSIS — M5441 Lumbago with sciatica, right side: Secondary | ICD-10-CM | POA: Diagnosis not present

## 2016-04-29 DIAGNOSIS — Z0000 Encounter for general adult medical examination without abnormal findings: Secondary | ICD-10-CM

## 2016-04-29 DIAGNOSIS — E039 Hypothyroidism, unspecified: Secondary | ICD-10-CM

## 2016-04-29 LAB — COMPREHENSIVE METABOLIC PANEL
ALT: 50 U/L — ABNORMAL HIGH (ref 0–35)
AST: 59 U/L — AB (ref 0–37)
Albumin: 3.7 g/dL (ref 3.5–5.2)
Alkaline Phosphatase: 109 U/L (ref 39–117)
BUN: 17 mg/dL (ref 6–23)
CALCIUM: 9 mg/dL (ref 8.4–10.5)
CHLORIDE: 108 meq/L (ref 96–112)
CO2: 28 meq/L (ref 19–32)
CREATININE: 1.05 mg/dL (ref 0.40–1.20)
GFR: 67.49 mL/min (ref >60.00)
GLUCOSE: 82 mg/dL (ref 70–99)
Potassium: 3.9 mEq/L (ref 3.5–5.1)
Sodium: 141 mEq/L (ref 135–145)
Total Bilirubin: 0.6 mg/dL (ref 0.2–1.2)
Total Protein: 7.9 g/dL (ref 6.0–8.3)

## 2016-04-29 LAB — POCT URINALYSIS DIPSTICK
Bilirubin, UA: NEGATIVE
Glucose, UA: NEGATIVE
Ketones, UA: NEGATIVE
Leukocytes, UA: NEGATIVE
NITRITE UA: NEGATIVE
PH UA: 6
PROTEIN UA: NEGATIVE
RBC UA: NEGATIVE
Spec Grav, UA: >=1.03
UROBILINOGEN UA: 0.2

## 2016-04-29 LAB — LIPID PANEL
CHOL/HDL RATIO: 3
Cholesterol: 147 mg/dL (ref 0–200)
HDL: 47.2 mg/dL (ref >39.00)
LDL CALC: 83 mg/dL (ref 0–99)
NONHDL: 99.65
TRIGLYCERIDES: 82 mg/dL (ref 0.0–149.0)
VLDL: 16.4 mg/dL (ref 0.0–40.0)

## 2016-04-29 LAB — CBC WITH DIFFERENTIAL/PLATELET
BASOS ABS: 0 10*3/uL (ref 0.0–0.1)
Basophils Relative: 0.7 % (ref 0.0–3.0)
EOS ABS: 0.2 10*3/uL (ref 0.0–0.7)
Eosinophils Relative: 3.5 % (ref 0.0–5.0)
HCT: 41 % (ref 36.0–46.0)
Hemoglobin: 14 g/dL (ref 12.0–15.0)
LYMPHS ABS: 2.9 10*3/uL (ref 0.7–4.0)
LYMPHS PCT: 42.2 % (ref 12.0–46.0)
MCHC: 34 g/dL (ref 30.0–36.0)
MCV: 92.4 fl (ref 78.0–100.0)
Monocytes Absolute: 0.3 10*3/uL (ref 0.1–1.0)
Monocytes Relative: 4.2 % (ref 3.0–12.0)
NEUTROS ABS: 3.4 10*3/uL (ref 1.4–7.7)
NEUTROS PCT: 49.4 % (ref 43.0–77.0)
PLATELETS: 172 10*3/uL (ref 150.0–400.0)
RBC: 4.44 Mil/uL (ref 3.87–5.11)
RDW: 13.5 % (ref 11.5–15.5)
WBC: 7 10*3/uL (ref 4.0–10.5)

## 2016-04-29 LAB — TSH: TSH: 2.25 u[IU]/mL (ref 0.35–4.50)

## 2016-04-29 MED ORDER — SUVOREXANT 20 MG PO TABS: 1.0000 | ORAL_TABLET | Freq: Every evening | ORAL | 0 refills | Status: DC | PRN

## 2016-04-29 MED ORDER — AMLODIPINE BESYLATE 10 MG PO TABS: 10.0000 mg | ORAL_TABLET | Freq: Every day | ORAL | 1 refills | Status: DC

## 2016-04-29 MED ORDER — ALBUTEROL SULFATE HFA 108 (90 BASE) MCG/ACT IN AERS: 2.0000 | INHALATION_SPRAY | Freq: Four times a day (QID) | RESPIRATORY_TRACT | Status: DC | PRN

## 2016-04-29 MED ORDER — SPIRONOLACTONE 25 MG PO TABS: 12.5000 mg | ORAL_TABLET | Freq: Every day | ORAL | 0 refills | Status: DC

## 2016-04-29 MED ORDER — LEVOTHYROXINE SODIUM 100 MCG PO TABS: 100.0000 ug | ORAL_TABLET | Freq: Every day | ORAL | Status: DC

## 2016-04-29 MED ORDER — LOSARTAN POTASSIUM 100 MG PO TABS: 100.0000 mg | ORAL_TABLET | Freq: Every day | ORAL | 1 refills | Status: DC

## 2016-04-29 NOTE — Patient Instructions (Signed)
Preventive Care for Adults, Female A healthy lifestyle and preventive care can promote health and wellness. Preventive health guidelines for women include the following key practices.  A routine yearly physical is a good way to check with your health care provider about your health and preventive screening. It is a chance to share any concerns and updates on your health and to receive a thorough exam.  Visit your dentist for a routine exam and preventive care every 6 months. Brush your teeth twice a day and floss once a day. Good oral hygiene prevents tooth decay and gum disease.  The frequency of eye exams is based on your age, health, family medical history, use of contact lenses, and other factors. Follow your health care provider's recommendations for frequency of eye exams.  Eat a healthy diet. Foods like vegetables, fruits, whole grains, low-fat dairy products, and lean protein foods contain the nutrients you need without too many calories. Decrease your intake of foods high in solid fats, added sugars, and salt. Eat the right amount of calories for you.Get information about a proper diet from your health care provider, if necessary.  Regular physical exercise is one of the most important things you can do for your health. Most adults should get at least 150 minutes of moderate-intensity exercise (any activity that increases your heart rate and causes you to sweat) each week. In addition, most adults need muscle-strengthening exercises on 2 or more days a week.  Maintain a healthy weight. The body mass index (BMI) is a screening tool to identify possible weight problems. It provides an estimate of body fat based on height and weight. Your health care provider can find your BMI and can help you achieve or maintain a healthy weight.For adults 20 years and older:  A BMI below 18.5 is considered underweight.  A BMI of 18.5 to 24.9 is normal.  A BMI of 25 to 29.9 is considered overweight.  A  BMI of 30 and above is considered obese.  Maintain normal blood lipids and cholesterol levels by exercising and minimizing your intake of saturated fat. Eat a balanced diet with plenty of fruit and vegetables. Blood tests for lipids and cholesterol should begin at age 45 and be repeated every 5 years. If your lipid or cholesterol levels are high, you are over 50, or you are at high risk for heart disease, you may need your cholesterol levels checked more frequently.Ongoing high lipid and cholesterol levels should be treated with medicines if diet and exercise are not working.  If you smoke, find out from your health care provider how to quit. If you do not use tobacco, do not start.  Lung cancer screening is recommended for adults aged 45-80 years who are at high risk for developing lung cancer because of a history of smoking. A yearly low-dose CT scan of the lungs is recommended for people who have at least a 30-pack-year history of smoking and are a current smoker or have quit within the past 15 years. A pack year of smoking is smoking an average of 1 pack of cigarettes a day for 1 year (for example: 1 pack a day for 30 years or 2 packs a day for 15 years). Yearly screening should continue until the smoker has stopped smoking for at least 15 years. Yearly screening should be stopped for people who develop a health problem that would prevent them from having lung cancer treatment.  If you are pregnant, do not drink alcohol. If you are  breastfeeding, be very cautious about drinking alcohol. If you are not pregnant and choose to drink alcohol, do not have more than 1 drink per day. One drink is considered to be 12 ounces (355 mL) of beer, 5 ounces (148 mL) of wine, or 1.5 ounces (44 mL) of liquor.  Avoid use of street drugs. Do not share needles with anyone. Ask for help if you need support or instructions about stopping the use of drugs.  High blood pressure causes heart disease and increases the risk  of stroke. Your blood pressure should be checked at least every 1 to 2 years. Ongoing high blood pressure should be treated with medicines if weight loss and exercise do not work.  If you are 55-79 years old, ask your health care provider if you should take aspirin to prevent strokes.  Diabetes screening is done by taking a blood sample to check your blood glucose level after you have not eaten for a certain period of time (fasting). If you are not overweight and you do not have risk factors for diabetes, you should be screened once every 3 years starting at age 45. If you are overweight or obese and you are 40-70 years of age, you should be screened for diabetes every year as part of your cardiovascular risk assessment.  Breast cancer screening is essential preventive care for women. You should practice "breast self-awareness." This means understanding the normal appearance and feel of your breasts and may include breast self-examination. Any changes detected, no matter how small, should be reported to a health care provider. Women in their 20s and 30s should have a clinical breast exam (CBE) by a health care provider as part of a regular health exam every 1 to 3 years. After age 40, women should have a CBE every year. Starting at age 40, women should consider having a mammogram (breast X-ray test) every year. Women who have a family history of breast cancer should talk to their health care provider about genetic screening. Women at a high risk of breast cancer should talk to their health care providers about having an MRI and a mammogram every year.  Breast cancer gene (BRCA)-related cancer risk assessment is recommended for women who have family members with BRCA-related cancers. BRCA-related cancers include breast, ovarian, tubal, and peritoneal cancers. Having family members with these cancers may be associated with an increased risk for harmful changes (mutations) in the breast cancer genes BRCA1 and  BRCA2. Results of the assessment will determine the need for genetic counseling and BRCA1 and BRCA2 testing.  Your health care provider may recommend that you be screened regularly for cancer of the pelvic organs (ovaries, uterus, and vagina). This screening involves a pelvic examination, including checking for microscopic changes to the surface of your cervix (Pap test). You may be encouraged to have this screening done every 3 years, beginning at age 21.  For women ages 30-65, health care providers may recommend pelvic exams and Pap testing every 3 years, or they may recommend the Pap and pelvic exam, combined with testing for human papilloma virus (HPV), every 5 years. Some types of HPV increase your risk of cervical cancer. Testing for HPV may also be done on women of any age with unclear Pap test results.  Other health care providers may not recommend any screening for nonpregnant women who are considered low risk for pelvic cancer and who do not have symptoms. Ask your health care provider if a screening pelvic exam is right for   you.  If you have had past treatment for cervical cancer or a condition that could lead to cancer, you need Pap tests and screening for cancer for at least 20 years after your treatment. If Pap tests have been discontinued, your risk factors (such as having a new sexual partner) need to be reassessed to determine if screening should resume. Some women have medical problems that increase the chance of getting cervical cancer. In these cases, your health care provider may recommend more frequent screening and Pap tests.  Colorectal cancer can be detected and often prevented. Most routine colorectal cancer screening begins at the age of 50 years and continues through age 75 years. However, your health care provider may recommend screening at an earlier age if you have risk factors for colon cancer. On a yearly basis, your health care provider may provide home test kits to check  for hidden blood in the stool. Use of a small camera at the end of a tube, to directly examine the colon (sigmoidoscopy or colonoscopy), can detect the earliest forms of colorectal cancer. Talk to your health care provider about this at age 50, when routine screening begins. Direct exam of the colon should be repeated every 5-10 years through age 75 years, unless early forms of precancerous polyps or small growths are found.  People who are at an increased risk for hepatitis B should be screened for this virus. You are considered at high risk for hepatitis B if:  You were born in a country where hepatitis B occurs often. Talk with your health care provider about which countries are considered high risk.  Your parents were born in a high-risk country and you have not received a shot to protect against hepatitis B (hepatitis B vaccine).  You have HIV or AIDS.  You use needles to inject street drugs.  You live with, or have sex with, someone who has hepatitis B.  You get hemodialysis treatment.  You take certain medicines for conditions like cancer, organ transplantation, and autoimmune conditions.  Hepatitis C blood testing is recommended for all people born from 1945 through 1965 and any individual with known risks for hepatitis C.  Practice safe sex. Use condoms and avoid high-risk sexual practices to reduce the spread of sexually transmitted infections (STIs). STIs include gonorrhea, chlamydia, syphilis, trichomonas, herpes, HPV, and human immunodeficiency virus (HIV). Herpes, HIV, and HPV are viral illnesses that have no cure. They can result in disability, cancer, and death.  You should be screened for sexually transmitted illnesses (STIs) including gonorrhea and chlamydia if:  You are sexually active and are younger than 24 years.  You are older than 24 years and your health care provider tells you that you are at risk for this type of infection.  Your sexual activity has changed  since you were last screened and you are at an increased risk for chlamydia or gonorrhea. Ask your health care provider if you are at risk.  If you are at risk of being infected with HIV, it is recommended that you take a prescription medicine daily to prevent HIV infection. This is called preexposure prophylaxis (PrEP). You are considered at risk if:  You are sexually active and do not regularly use condoms or know the HIV status of your partner(s).  You take drugs by injection.  You are sexually active with a partner who has HIV.  Talk with your health care provider about whether you are at high risk of being infected with HIV. If   you choose to begin PrEP, you should first be tested for HIV. You should then be tested every 3 months for as long as you are taking PrEP.  Osteoporosis is a disease in which the bones lose minerals and strength with aging. This can result in serious bone fractures or breaks. The risk of osteoporosis can be identified using a bone density scan. Women ages 67 years and over and women at risk for fractures or osteoporosis should discuss screening with their health care providers. Ask your health care provider whether you should take a calcium supplement or vitamin D to reduce the rate of osteoporosis.  Menopause can be associated with physical symptoms and risks. Hormone replacement therapy is available to decrease symptoms and risks. You should talk to your health care provider about whether hormone replacement therapy is right for you.  Use sunscreen. Apply sunscreen liberally and repeatedly throughout the day. You should seek shade when your shadow is shorter than you. Protect yourself by wearing long sleeves, pants, a wide-brimmed hat, and sunglasses year round, whenever you are outdoors.  Once a month, do a whole body skin exam, using a mirror to look at the skin on your back. Tell your health care provider of new moles, moles that have irregular borders, moles that  are larger than a pencil eraser, or moles that have changed in shape or color.  Stay current with required vaccines (immunizations).  Influenza vaccine. All adults should be immunized every year.  Tetanus, diphtheria, and acellular pertussis (Td, Tdap) vaccine. Pregnant women should receive 1 dose of Tdap vaccine during each pregnancy. The dose should be obtained regardless of the length of time since the last dose. Immunization is preferred during the 27th-36th week of gestation. An adult who has not previously received Tdap or who does not know her vaccine status should receive 1 dose of Tdap. This initial dose should be followed by tetanus and diphtheria toxoids (Td) booster doses every 10 years. Adults with an unknown or incomplete history of completing a 3-dose immunization series with Td-containing vaccines should begin or complete a primary immunization series including a Tdap dose. Adults should receive a Td booster every 10 years.  Varicella vaccine. An adult without evidence of immunity to varicella should receive 2 doses or a second dose if she has previously received 1 dose. Pregnant females who do not have evidence of immunity should receive the first dose after pregnancy. This first dose should be obtained before leaving the health care facility. The second dose should be obtained 4-8 weeks after the first dose.  Human papillomavirus (HPV) vaccine. Females aged 13-26 years who have not received the vaccine previously should obtain the 3-dose series. The vaccine is not recommended for use in pregnant females. However, pregnancy testing is not needed before receiving a dose. If a female is found to be pregnant after receiving a dose, no treatment is needed. In that case, the remaining doses should be delayed until after the pregnancy. Immunization is recommended for any person with an immunocompromised condition through the age of 61 years if she did not get any or all doses earlier. During the  3-dose series, the second dose should be obtained 4-8 weeks after the first dose. The third dose should be obtained 24 weeks after the first dose and 16 weeks after the second dose.  Zoster vaccine. One dose is recommended for adults aged 30 years or older unless certain conditions are present.  Measles, mumps, and rubella (MMR) vaccine. Adults born  before 1957 generally are considered immune to measles and mumps. Adults born in 1957 or later should have 1 or more doses of MMR vaccine unless there is a contraindication to the vaccine or there is laboratory evidence of immunity to each of the three diseases. A routine second dose of MMR vaccine should be obtained at least 28 days after the first dose for students attending postsecondary schools, health care workers, or international travelers. People who received inactivated measles vaccine or an unknown type of measles vaccine during 1963-1967 should receive 2 doses of MMR vaccine. People who received inactivated mumps vaccine or an unknown type of mumps vaccine before 1979 and are at high risk for mumps infection should consider immunization with 2 doses of MMR vaccine. For females of childbearing age, rubella immunity should be determined. If there is no evidence of immunity, females who are not pregnant should be vaccinated. If there is no evidence of immunity, females who are pregnant should delay immunization until after pregnancy. Unvaccinated health care workers born before 1957 who lack laboratory evidence of measles, mumps, or rubella immunity or laboratory confirmation of disease should consider measles and mumps immunization with 2 doses of MMR vaccine or rubella immunization with 1 dose of MMR vaccine.  Pneumococcal 13-valent conjugate (PCV13) vaccine. When indicated, a person who is uncertain of his immunization history and has no record of immunization should receive the PCV13 vaccine. All adults 65 years of age and older should receive this  vaccine. An adult aged 19 years or older who has certain medical conditions and has not been previously immunized should receive 1 dose of PCV13 vaccine. This PCV13 should be followed with a dose of pneumococcal polysaccharide (PPSV23) vaccine. Adults who are at high risk for pneumococcal disease should obtain the PPSV23 vaccine at least 8 weeks after the dose of PCV13 vaccine. Adults older than 65 years of age who have normal immune system function should obtain the PPSV23 vaccine dose at least 1 year after the dose of PCV13 vaccine.  Pneumococcal polysaccharide (PPSV23) vaccine. When PCV13 is also indicated, PCV13 should be obtained first. All adults aged 65 years and older should be immunized. An adult younger than age 65 years who has certain medical conditions should be immunized. Any person who resides in a nursing home or long-term care facility should be immunized. An adult smoker should be immunized. People with an immunocompromised condition and certain other conditions should receive both PCV13 and PPSV23 vaccines. People with human immunodeficiency virus (HIV) infection should be immunized as soon as possible after diagnosis. Immunization during chemotherapy or radiation therapy should be avoided. Routine use of PPSV23 vaccine is not recommended for American Indians, [place] Natives, or people younger than 65 years unless there are medical conditions that require PPSV23 vaccine. When indicated, people who have unknown immunization and have no record of immunization should receive PPSV23 vaccine. One-time revaccination 5 years after the first dose of PPSV23 is recommended for people aged 19-64 years who have chronic kidney failure, nephrotic syndrome, asplenia, or immunocompromised conditions. People who received 1-2 doses of PPSV23 before age 65 years should receive another dose of PPSV23 vaccine at age 65 years or later if at least 5 years have passed since the previous dose. Doses of PPSV23 are not  needed for people immunized with PPSV23 at or after age 65 years.  Meningococcal vaccine. Adults with asplenia or persistent complement component deficiencies should receive 2 doses of quadrivalent meningococcal conjugate (MenACWY-D) vaccine. The doses should be obtained   at least 2 months apart. Microbiologists working with certain meningococcal bacteria, [place] recruits, people at risk during an outbreak, and people who travel to or live in countries with a high rate of meningitis should be immunized. A first-year college student up through age 34 years who is living in a residence hall should receive a dose if she did not receive a dose on or after her 16th birthday. Adults who have certain high-risk conditions should receive one or more doses of vaccine.  Hepatitis A vaccine. Adults who wish to be protected from this disease, have certain high-risk conditions, work with hepatitis A-infected animals, work in hepatitis A research labs, or travel to or work in countries with a high rate of hepatitis A should be immunized. Adults who were previously unvaccinated and who anticipate close contact with an international adoptee during the first 60 days after arrival in the [place] from a country with a high rate of hepatitis A should be immunized.  Hepatitis B vaccine. Adults who wish to be protected from this disease, have certain high-risk conditions, may be exposed to blood or other infectious body fluids, are household contacts or sex partners of hepatitis B positive people, are clients or workers in certain care facilities, or travel to or work in countries with a high rate of hepatitis B should be immunized.  Haemophilus influenzae type b (Hib) vaccine. A previously unvaccinated person with asplenia or sickle cell disease or having a scheduled splenectomy should receive 1 dose of Hib vaccine. Regardless of previous immunization, a recipient of a hematopoietic stem cell transplant should receive a  3-dose series 6-12 months after her successful transplant. Hib vaccine is not recommended for adults with HIV infection. Preventive Services / Frequency Ages 35 to 4 years  Blood pressure check.** / Every 3-5 years.  Lipid and cholesterol check.** / Every 5 years beginning at age 60.  Clinical breast exam.** / Every 3 years for women in their 71s and 10s.  BRCA-related cancer risk assessment.** / For women who have family members with a BRCA-related cancer (breast, ovarian, tubal, or peritoneal cancers).  Pap test.** / Every 2 years from ages 76 through 26. Every 3 years starting at age 61 through age 76 or 93 with a history of 3 consecutive normal Pap tests.  HPV screening.** / Every 3 years from ages 37 through ages 60 to 51 with a history of 3 consecutive normal Pap tests.  Hepatitis C blood test.** / For any individual with known risks for hepatitis C.  Skin self-exam. / Monthly.  Influenza vaccine. / Every year.  Tetanus, diphtheria, and acellular pertussis (Tdap, Td) vaccine.** / Consult your health care provider. Pregnant women should receive 1 dose of Tdap vaccine during each pregnancy. 1 dose of Td every 10 years.  Varicella vaccine.** / Consult your health care provider. Pregnant females who do not have evidence of immunity should receive the first dose after pregnancy.  HPV vaccine. / 3 doses over 6 months, if 93 and younger. The vaccine is not recommended for use in pregnant females. However, pregnancy testing is not needed before receiving a dose.  Measles, mumps, rubella (MMR) vaccine.** / You need at least 1 dose of MMR if you were born in 1957 or later. You may also need a 2nd dose. For females of childbearing age, rubella immunity should be determined. If there is no evidence of immunity, females who are not pregnant should be vaccinated. If there is no evidence of immunity, females who are  pregnant should delay immunization until after pregnancy.  Pneumococcal  13-valent conjugate (PCV13) vaccine.** / Consult your health care provider.  Pneumococcal polysaccharide (PPSV23) vaccine.** / 1 to 2 doses if you smoke cigarettes or if you have certain conditions.  Meningococcal vaccine.** / 1 dose if you are age 68 to 8 years and a Market researcher living in a residence hall, or have one of several medical conditions, you need to get vaccinated against meningococcal disease. You may also need additional booster doses.  Hepatitis A vaccine.** / Consult your health care provider.  Hepatitis B vaccine.** / Consult your health care provider.  Haemophilus influenzae type b (Hib) vaccine.** / Consult your health care provider. Ages 7 to 53 years  Blood pressure check.** / Every year.  Lipid and cholesterol check.** / Every 5 years beginning at age 25 years.  Lung cancer screening. / Every year if you are aged 11-80 years and have a 30-pack-year history of smoking and currently smoke or have quit within the past 15 years. Yearly screening is stopped once you have quit smoking for at least 15 years or develop a health problem that would prevent you from having lung cancer treatment.  Clinical breast exam.** / Every year after age 48 years.  BRCA-related cancer risk assessment.** / For women who have family members with a BRCA-related cancer (breast, ovarian, tubal, or peritoneal cancers).  Mammogram.** / Every year beginning at age 41 years and continuing for as long as you are in good health. Consult with your health care provider.  Pap test.** / Every 3 years starting at age 65 years through age 37 or 70 years with a history of 3 consecutive normal Pap tests.  HPV screening.** / Every 3 years from ages 72 years through ages 60 to 40 years with a history of 3 consecutive normal Pap tests.  Fecal occult blood test (FOBT) of stool. / Every year beginning at age 21 years and continuing until age 5 years. You may not need to do this test if you get  a colonoscopy every 10 years.  Flexible sigmoidoscopy or colonoscopy.** / Every 5 years for a flexible sigmoidoscopy or every 10 years for a colonoscopy beginning at age 35 years and continuing until age 48 years.  Hepatitis C blood test.** / For all people born from [birth year] through 1965 and any individual with known risks for hepatitis C.  Skin self-exam. / Monthly.  Influenza vaccine. / Every year.  Tetanus, diphtheria, and acellular pertussis (Tdap/Td) vaccine.** / Consult your health care provider. Pregnant women should receive 1 dose of Tdap vaccine during each pregnancy. 1 dose of Td every 10 years.  Varicella vaccine.** / Consult your health care provider. Pregnant females who do not have evidence of immunity should receive the first dose after pregnancy.  Zoster vaccine.** / 1 dose for adults aged 30 years or older.  Measles, mumps, rubella (MMR) vaccine.** / You need at least 1 dose of MMR if you were born in 1957 or later. You may also need a second dose. For females of childbearing age, rubella immunity should be determined. If there is no evidence of immunity, females who are not pregnant should be vaccinated. If there is no evidence of immunity, females who are pregnant should delay immunization until after pregnancy.  Pneumococcal 13-valent conjugate (PCV13) vaccine.** / Consult your health care provider.  Pneumococcal polysaccharide (PPSV23) vaccine.** / 1 to 2 doses if you smoke cigarettes or if you have certain conditions.  Meningococcal vaccine.** /  Consult your health care provider.  Hepatitis A vaccine.** / Consult your health care provider.  Hepatitis B vaccine.** / Consult your health care provider.  Haemophilus influenzae type b (Hib) vaccine.** / Consult your health care provider. Ages 64 years and over  Blood pressure check.** / Every year.  Lipid and cholesterol check.** / Every 5 years beginning at age 23 years.  Lung cancer screening. / Every year if you  are aged 16-80 years and have a 30-pack-year history of smoking and currently smoke or have quit within the past 15 years. Yearly screening is stopped once you have quit smoking for at least 15 years or develop a health problem that would prevent you from having lung cancer treatment.  Clinical breast exam.** / Every year after age 74 years.  BRCA-related cancer risk assessment.** / For women who have family members with a BRCA-related cancer (breast, ovarian, tubal, or peritoneal cancers).  Mammogram.** / Every year beginning at age 44 years and continuing for as long as you are in good health. Consult with your health care provider.  Pap test.** / Every 3 years starting at age 58 years through age 22 or 39 years with 3 consecutive normal Pap tests. Testing can be stopped between 65 and 70 years with 3 consecutive normal Pap tests and no abnormal Pap or HPV tests in the past 10 years.  HPV screening.** / Every 3 years from ages 64 years through ages 70 or 61 years with a history of 3 consecutive normal Pap tests. Testing can be stopped between 65 and 70 years with 3 consecutive normal Pap tests and no abnormal Pap or HPV tests in the past 10 years.  Fecal occult blood test (FOBT) of stool. / Every year beginning at age 40 years and continuing until age 27 years. You may not need to do this test if you get a colonoscopy every 10 years.  Flexible sigmoidoscopy or colonoscopy.** / Every 5 years for a flexible sigmoidoscopy or every 10 years for a colonoscopy beginning at age 7 years and continuing until age 32 years.  Hepatitis C blood test.** / For all people born from [birth year] through 1965 and any individual with known risks for hepatitis C.  Osteoporosis screening.** / A one-time screening for women ages 30 years and over and women at risk for fractures or osteoporosis.  Skin self-exam. / Monthly.  Influenza vaccine. / Every year.  Tetanus, diphtheria, and acellular pertussis (Tdap/Td)  vaccine.** / 1 dose of Td every 10 years.  Varicella vaccine.** / Consult your health care provider.  Zoster vaccine.** / 1 dose for adults aged 35 years or older.  Pneumococcal 13-valent conjugate (PCV13) vaccine.** / Consult your health care provider.  Pneumococcal polysaccharide (PPSV23) vaccine.** / 1 dose for all adults aged 46 years and older.  Meningococcal vaccine.** / Consult your health care provider.  Hepatitis A vaccine.** / Consult your health care provider.  Hepatitis B vaccine.** / Consult your health care provider.  Haemophilus influenzae type b (Hib) vaccine.** / Consult your health care provider. ** Family history and personal history of risk and conditions may change your health care provider's recommendations.   This information is not intended to replace advice given to you by your health care provider. Make sure you discuss any questions you have with your health care provider.   Document Released: 09/10/2001 Document Revised: 08/05/2014 Document Reviewed: 12/10/2010 Elsevier Interactive Patient Education [employer].

## 2016-04-29 NOTE — Progress Notes (Signed)
Subjective:     Kathleen Jensen is a 65 y.o. female and is here for a comprehensive physical exam. The patient reports problems - mult problems.  she had a hx of anal fissure and thinks it is open again.  she also c/o severe back pain and has not heard from her pain specialist about her injections.   .pt also c/o sleep meds not working.  Social History   Social History  . Marital status: Divorced    Spouse name: N/A  . Number of children: 3  . Years of education: 9839yrs   Occupational History  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 Smoking status: Former Smoker    Years: 4.00    Quit date: 07/30/1979  . Smokeless tobacco: Not on file  . Alcohol use No  . Drug use: No  . Sexual activity: Not Currently    Partners: Male   Other Topics Concern  . Not on file   Social History Narrative   Patient lives at home alone.   Caffeine Use: 1 cup daily   1 dog      Health Maintenance  Topic Date Due  . Hepatitis C Screening  03/01/1951  . HIV Screening  08/12/1965  . ZOSTAVAX  08/12/2010  . PNA vac Low Risk Adult (1 of 2 - PCV13) 08/13/2015  . INFLUENZA VACCINE  02/27/2016  . PAP SMEAR  07/29/2017  . TETANUS/TDAP  07/29/2017  . MAMMOGRAM  04/04/2018  . COLONOSCOPY  07/29/2018  . DEXA SCAN  Completed    The following portions of the patient's history were reviewed and updated as appropriate:  She  has a past medical history of Arthritis; Diabetes mellitus without complication (HCC); and Hypertension. She  does not have any pertinent problems on file. 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 Her family history includes Cancer in her brother and sister; Diabetes in her father and mother; High blood pressure in her mother; Stroke in her father. She  reports that she quit smoking about 36 years ago. She quit after 4.00 years of use. She does not  have any smokeless tobacco history on file. She reports that she does not drink alcohol or use drugs. She has a current medication list which includes the following prescription(s): albuterol, amlodipine, aspirin, vitamin d3, cyclobenzaprine, levothyroxine, losartan, spironolactone, tramadol, and suvorexant. Current Outpatient Prescriptions on File Prior to Visit  Medication Sig Dispense Refill  . aspirin 81 MG tablet Take 81 mg by mouth daily.    . Cholecalciferol (VITAMIN D3) 1000 units CAPS Take by mouth.    . cyclobenzaprine (FLEXERIL) 10 MG tablet Take 10 mg by mouth 3 (three) times daily as needed.     No current facility-administered medications on file prior to visit.    She is allergic to tape; meperidine hcl; sulfonamide derivatives; bacitracin-polymyxin b; and oxycodone..  Review of Systems  Review of Systems  Constitutional: Negative for activity change, appetite change and fatigue.  HENT: Negative for hearing loss, congestion, tinnitus and ear discharge.   Eyes: Negative for visual disturbance (see optho q1y -- vision corrected to 20/20 with glasses).  Respiratory: Negative for cough, chest tightness and shortness of breath.   Cardiovascular: Negative for chest pain, palpitations and leg swelling.  Gastrointestinal: Negative for abdominal pain, diarrhea, constipation and abdominal distention.  Genitourinary: Negative for urgency, frequency, decreased urine  volume and difficulty urinating.  Musculoskeletal: Negative for back pain, arthralgias and gait problem.  Skin: Negative for color change, pallor and rash.  Neurological: Negative for dizziness, light-headedness, numbness and headaches.  Hematological: Negative for adenopathy. Does not bruise/bleed easily.  Psychiatric/Behavioral: Negative for suicidal ideas, confusion, sleep disturbance, self-injury, dysphoric mood, decreased concentration and agitation.  Pt is able to read and write and can do all ADLs No risk for  falling No abuse/ violence in home    Objective:    BP 125/85 (BP Location: Left Arm, Patient Position: Sitting, Cuff Size: Large)   Pulse 65   Temp 98.7 F (37.1 C) (Oral)   Resp 16   Ht 5\' 4"  (1.626 m)   Wt 209 lb 3.2 oz (94.9 kg)   SpO2 98%   BMI 35.91 kg/m  General appearance: alert, cooperative, appears stated age and no distress Head: Normocephalic, without obvious abnormality, atraumatic Eyes: conjunctivae/corneas clear. PERRL, EOM's intact. Fundi benign. Ears: normal TM's and external ear canals both ears Nose: Nares normal. Septum midline. Mucosa normal. No drainage or sinus tenderness. Throat: lips, mucosa, and tongue normal; teeth and gums normal Neck: no adenopathy, no carotid bruit, no JVD, supple, symmetrical, trachea midline and thyroid not enlarged, symmetric, no tenderness/mass/nodules Back: symmetric, no curvature. ROM normal. No CVA tenderness. Lungs: clear to auscultation bilaterally Breasts: gyn Heart: regular rate and rhythm, S1, S2 normal, no murmur, click, rub or gallop Abdomen: soft, non-tender; bowel sounds normal; no masses,  no organomegaly Pelvic: deferred--gyn Rectal-- heme positive brown stool, sm hemorrhoids Extremities: extremities normal, atraumatic, no cyanosis or edema Pulses: 2+ and symmetric Skin: Skin color, texture, turgor normal. No rashes or lesions Lymph nodes: Cervical, supraclavicular, and axillary nodes normal. Neurologic: Alert and oriented X 3, normal strength and tone. Normal symmetric reflexes. Normal coordination and gait    Assessment:    Healthy female exam.      Plan:    ghm utd  Check labs See After Visit Summary for Counseling Recommendations   1. Hypothyroidism, unspecified type Check labs - TSH - levothyroxine (SYNTHROID, LEVOTHROID) 100 MCG tablet; Take 1 tablet (100 mcg total) by mouth daily.  2. Hypertension, unspecified type stable - Comprehensive metabolic panel - CBC with Differential/Platelet -  POCT urinalysis dipstick - spironolactone (ALDACTONE) 25 MG tablet; Take 0.5 tablets (12.5 mg total) by mouth daily.; Refill: 0 - amLODipine (NORVASC) 10 MG tablet; Take 1 tablet (10 mg total) by mouth daily.  Dispense: 90 tablet; Refill: 1  3. Insomnia, unspecified type Try 20 mg and call if it does not work - Suvorexant (BELSOMRA) 20 MG TABS; Take 1 tablet by mouth at bedtime as needed.  Dispense: 10 tablet; Refill: 0  4. Chronic low back pain with bilateral sciatica, unspecified back pain laterality con't pain management  - Comprehensive metabolic panel - Lipid panel - CBC with Differential/Platelet - POCT urinalysis dipstick - TSH  5. Encounter for immunization Flu shot - traMADol (ULTRAM) 50 MG tablet; Take 100 mg by mouth 2 (two) times daily as needed. - Comprehensive metabolic panel - Lipid panel - CBC with Differential/Platelet - POCT urinalysis dipstick - TSH - Flu vaccine HIGH DOSE PF - Suvorexant (BELSOMRA) 20 MG TABS; Take 1 tablet by mouth at bedtime as needed.  Dispense: 10 tablet; Refill: 0 - Ambulatory referral to Gastroenterology - spironolactone (ALDACTONE) 25 MG tablet; Take 0.5 tablets (12.5 mg total) by mouth daily.; Refill: 0 - losartan (COZAAR) 100 MG tablet; Take 1 tablet (100 mg total) by mouth  daily.  Dispense: 90 tablet; Refill: 1 - levothyroxine (SYNTHROID, LEVOTHROID) 100 MCG tablet; Take 1 tablet (100 mcg total) by mouth daily. - amLODipine (NORVASC) 10 MG tablet; Take 1 tablet (10 mg total) by mouth daily.  Dispense: 90 tablet; Refill: 1 - albuterol (PROVENTIL HFA;VENTOLIN HFA) 108 (90 Base) MCG/ACT inhaler; Inhale 2 puffs into the lungs every 6 (six) hours as needed.  6. Heme + stool   - Ambulatory referral to Gastroenterology  7. Essential hypertension stable - losartan (COZAAR) 100 MG tablet; Take 1 tablet (100 mg total) by mouth daily.  Dispense: 90 tablet; Refill: 1  8. Mild intermittent asthma, unspecified whether complicated stable -  albuterol (PROVENTIL HFA;VENTOLIN HFA) 108 (90 Base) MCG/ACT inhaler; Inhale 2 puffs into the lungs every 6 (six) hours as needed.

## 2016-04-29 NOTE — Progress Notes (Signed)
Pre visit review using our clinic review tool, if applicable. No additional management support is needed unless otherwise documented below in the visit note. 

## 2016-04-30 ENCOUNTER — Encounter: Payer: Self-pay | Admitting: Gastroenterology

## 2016-05-10 ENCOUNTER — Other Ambulatory Visit: Payer: Self-pay

## 2016-05-10 ENCOUNTER — Other Ambulatory Visit: Payer: Self-pay | Admitting: Family Medicine

## 2016-05-10 DIAGNOSIS — I10 Essential (primary) hypertension: Secondary | ICD-10-CM

## 2016-05-10 DIAGNOSIS — R945 Abnormal results of liver function studies: Principal | ICD-10-CM

## 2016-05-10 DIAGNOSIS — R7989 Other specified abnormal findings of blood chemistry: Secondary | ICD-10-CM

## 2016-05-10 DIAGNOSIS — G4700 Insomnia, unspecified: Secondary | ICD-10-CM

## 2016-05-10 MED ORDER — SPIRONOLACTONE 25 MG PO TABS: 12.5000 mg | ORAL_TABLET | Freq: Every day | ORAL | 1 refills | Status: DC

## 2016-05-10 MED ORDER — SUVOREXANT 20 MG PO TABS: 1.0000 | ORAL_TABLET | Freq: Every evening | ORAL | 3 refills | Status: DC | PRN

## 2016-05-10 NOTE — Telephone Encounter (Signed)
Rx faxed.    KP 

## 2016-05-10 NOTE — Telephone Encounter (Signed)
Self. Refill request BELSOMRA, spironolactone       Pharmacy: [id number] - [place], [place] - [address]

## 2016-05-10 NOTE — Telephone Encounter (Signed)
Belsomra worked and the patient is requesting a 30 day supply. Please advise    KP

## 2016-05-10 NOTE — Telephone Encounter (Signed)
Ok to send #30  3 refills

## 2016-05-21 DIAGNOSIS — M48062 Spinal stenosis, lumbar region with neurogenic claudication: Secondary | ICD-10-CM | POA: Diagnosis not present

## 2016-05-21 DIAGNOSIS — M47817 Spondylosis without myelopathy or radiculopathy, lumbosacral region: Secondary | ICD-10-CM | POA: Diagnosis not present

## 2016-05-24 ENCOUNTER — Other Ambulatory Visit (INDEPENDENT_AMBULATORY_CARE_PROVIDER_SITE_OTHER): Payer: Medicare Other

## 2016-05-24 DIAGNOSIS — R945 Abnormal results of liver function studies: Principal | ICD-10-CM

## 2016-05-24 DIAGNOSIS — R7989 Other specified abnormal findings of blood chemistry: Secondary | ICD-10-CM | POA: Diagnosis not present

## 2016-05-24 LAB — COMPREHENSIVE METABOLIC PANEL
ALT: 35 U/L (ref 0–35)
AST: 42 U/L — AB (ref 0–37)
Albumin: 3.6 g/dL (ref 3.5–5.2)
Alkaline Phosphatase: 83 U/L (ref 39–117)
BUN: 18 mg/dL (ref 6–23)
CALCIUM: 9.1 mg/dL (ref 8.4–10.5)
CHLORIDE: 105 meq/L (ref 96–112)
CO2: 30 meq/L (ref 19–32)
CREATININE: 1.01 mg/dL (ref 0.40–1.20)
GFR: 70.57 mL/min (ref >60.00)
Glucose, Bld: 82 mg/dL (ref 70–99)
Potassium: 4 mEq/L (ref 3.5–5.1)
Sodium: 139 mEq/L (ref 135–145)
Total Bilirubin: 0.4 mg/dL (ref 0.2–1.2)
Total Protein: 7.5 g/dL (ref 6.0–8.3)

## 2016-05-29 DIAGNOSIS — Z950 Presence of cardiac pacemaker: Secondary | ICD-10-CM | POA: Diagnosis not present

## 2016-05-29 DIAGNOSIS — Z45018 Encounter for adjustment and management of other part of cardiac pacemaker: Secondary | ICD-10-CM | POA: Diagnosis not present

## 2016-07-03 ENCOUNTER — Ambulatory Visit: Payer: Medicare Other | Admitting: Gastroenterology

## 2016-08-22 NOTE — Telephone Encounter (Signed)
Pt says that she cant afford the BELSOMRA. She would like to know if provider could put her back on Ambien or something compatible? She said that Belsomra is 200.00

## 2016-08-22 NOTE — Telephone Encounter (Signed)
Pt says that she cant afford the BELSOMRA. She would like to know if provider could put her back on Ambien or something compatible? She said that Belsomra is 200.00. Please advise. LB

## 2016-08-22 NOTE — Telephone Encounter (Signed)
ambien not recommended in over 65---- try trazadone 50 mg  1/2 -1 po qhs prn #30  2 refills

## 2016-08-23 MED ORDER — TRAZODONE HCL 50 MG PO TABS: ORAL_TABLET | ORAL | 2 refills | Status: DC

## 2016-08-23 NOTE — Telephone Encounter (Signed)
Called the patient informed of PCP instructions on medication. Sent in [facility] to [facility] in [place] The patient was ok to try this medication.

## 2016-08-23 NOTE — Addendum Note (Signed)
Addended by: Scharlene GlossEWING, ROBIN B on: 08/23/2016 11:30 AM   Modules accepted: Orders

## 2016-08-28 DIAGNOSIS — Z950 Presence of cardiac pacemaker: Secondary | ICD-10-CM | POA: Diagnosis not present

## 2016-09-19 DIAGNOSIS — M4316 Spondylolisthesis, lumbar region: Secondary | ICD-10-CM | POA: Diagnosis not present

## 2016-09-19 DIAGNOSIS — M5432 Sciatica, left side: Secondary | ICD-10-CM | POA: Diagnosis not present

## 2016-09-19 DIAGNOSIS — M4726 Other spondylosis with radiculopathy, lumbar region: Secondary | ICD-10-CM | POA: Diagnosis not present

## 2016-09-19 DIAGNOSIS — M5431 Sciatica, right side: Secondary | ICD-10-CM | POA: Diagnosis not present

## 2016-10-01 ENCOUNTER — Other Ambulatory Visit: Payer: Self-pay | Admitting: Family Medicine

## 2016-10-01 DIAGNOSIS — I10 Essential (primary) hypertension: Secondary | ICD-10-CM

## 2016-10-01 DIAGNOSIS — I159 Secondary hypertension, unspecified: Secondary | ICD-10-CM

## 2016-10-01 DIAGNOSIS — G4700 Insomnia, unspecified: Secondary | ICD-10-CM

## 2016-10-01 DIAGNOSIS — E039 Hypothyroidism, unspecified: Secondary | ICD-10-CM

## 2016-10-01 MED ORDER — LEVOTHYROXINE SODIUM 100 MCG PO TABS: 100.0000 ug | ORAL_TABLET | Freq: Every day | ORAL | 1 refills | Status: DC

## 2016-10-01 MED ORDER — LOSARTAN POTASSIUM 100 MG PO TABS: 100.0000 mg | ORAL_TABLET | Freq: Every day | ORAL | 1 refills | Status: DC

## 2016-10-01 MED ORDER — SPIRONOLACTONE 25 MG PO TABS
12.5000 mg | ORAL_TABLET | Freq: Every day | ORAL | 1 refills | Status: DC
Start: 2016-10-01 — End: 2017-02-17

## 2016-10-01 MED ORDER — AMLODIPINE BESYLATE 10 MG PO TABS: 10.0000 mg | ORAL_TABLET | Freq: Every day | ORAL | 1 refills | Status: DC

## 2016-10-04 ENCOUNTER — Other Ambulatory Visit: Payer: Self-pay | Admitting: *Deleted

## 2016-10-04 MED ORDER — TRAZODONE HCL 50 MG PO TABS: ORAL_TABLET | ORAL | 1 refills | Status: DC

## 2016-10-07 DIAGNOSIS — M4316 Spondylolisthesis, lumbar region: Secondary | ICD-10-CM | POA: Diagnosis not present

## 2016-10-07 DIAGNOSIS — M1288 Other specific arthropathies, not elsewhere classified, other specified site: Secondary | ICD-10-CM | POA: Diagnosis not present

## 2016-10-07 DIAGNOSIS — M5126 Other intervertebral disc displacement, lumbar region: Secondary | ICD-10-CM | POA: Diagnosis not present

## 2016-10-07 DIAGNOSIS — M48061 Spinal stenosis, lumbar region without neurogenic claudication: Secondary | ICD-10-CM | POA: Diagnosis not present

## 2016-10-21 DIAGNOSIS — M5431 Sciatica, right side: Secondary | ICD-10-CM | POA: Diagnosis not present

## 2016-10-21 DIAGNOSIS — M4726 Other spondylosis with radiculopathy, lumbar region: Secondary | ICD-10-CM | POA: Diagnosis not present

## 2016-10-21 DIAGNOSIS — M4316 Spondylolisthesis, lumbar region: Secondary | ICD-10-CM | POA: Diagnosis not present

## 2016-10-21 DIAGNOSIS — M5432 Sciatica, left side: Secondary | ICD-10-CM | POA: Diagnosis not present

## 2016-10-23 ENCOUNTER — Telehealth: Payer: Self-pay | Admitting: Family Medicine

## 2016-10-23 DIAGNOSIS — E039 Hypothyroidism, unspecified: Secondary | ICD-10-CM

## 2016-10-23 DIAGNOSIS — I159 Secondary hypertension, unspecified: Secondary | ICD-10-CM

## 2016-10-23 MED ORDER — LEVOTHYROXINE SODIUM 100 MCG PO TABS: 100.0000 ug | ORAL_TABLET | Freq: Every day | ORAL | 0 refills | Status: DC

## 2016-10-23 NOTE — Telephone Encounter (Signed)
Caller name:Lilyannah Stickles Relationship to patient: Can be reached:[phone] Pharmacy:  Reason for call:Patient is needing a weeks worth of levothyroxine (SYNTHROID, LEVOTHROID) 100 MCG tablet [facility] [address] [facility] [place]. Mail order is going to take a week to get her meds to her and she is completely out.

## 2016-10-23 NOTE — Telephone Encounter (Signed)
Sent in as requested. Called the patient to inform had to leave her a detailed message script sent in.

## 2016-10-24 ENCOUNTER — Other Ambulatory Visit: Payer: Self-pay | Admitting: Family Medicine

## 2016-10-24 DIAGNOSIS — I159 Secondary hypertension, unspecified: Secondary | ICD-10-CM

## 2016-10-24 DIAGNOSIS — E039 Hypothyroidism, unspecified: Secondary | ICD-10-CM

## 2016-10-24 MED ORDER — LEVOTHYROXINE SODIUM 100 MCG PO TABS: 100.0000 ug | ORAL_TABLET | Freq: Every day | ORAL | 0 refills | Status: DC

## 2016-11-01 ENCOUNTER — Telehealth: Payer: Self-pay

## 2016-11-01 NOTE — Telephone Encounter (Signed)
Patient scheduled for 11/19/2016 with Dr. Laury Axon

## 2016-11-01 NOTE — Telephone Encounter (Signed)
Received surgical clearance forms for patient via fax on 10/30/16.  Pt's last OV: 04/29/16.  Please call patient to schedule a 30 min office visit for surgical clearance.  Forms forwarded to [employer].

## 2016-11-14 ENCOUNTER — Ambulatory Visit (INDEPENDENT_AMBULATORY_CARE_PROVIDER_SITE_OTHER): Payer: Medicare HMO | Admitting: Medical

## 2016-11-14 ENCOUNTER — Encounter: Payer: Self-pay | Admitting: Medical

## 2016-11-14 VITALS — BP 139/87 | HR 65 | Temp 97.5°F | Resp 16 | Ht 64.0 in | Wt 210.2 lb

## 2016-11-14 DIAGNOSIS — R319 Hematuria, unspecified: Secondary | ICD-10-CM | POA: Diagnosis not present

## 2016-11-14 DIAGNOSIS — R82998 Other abnormal findings in urine: Secondary | ICD-10-CM

## 2016-11-14 DIAGNOSIS — R69 Illness, unspecified: Secondary | ICD-10-CM | POA: Diagnosis not present

## 2016-11-14 DIAGNOSIS — N3000 Acute cystitis without hematuria: Secondary | ICD-10-CM

## 2016-11-14 DIAGNOSIS — R300 Dysuria: Secondary | ICD-10-CM

## 2016-11-14 DIAGNOSIS — R8299 Other abnormal findings in urine: Secondary | ICD-10-CM | POA: Diagnosis not present

## 2016-11-14 LAB — POC URINALSYSI DIPSTICK (AUTOMATED)
BILIRUBIN UA: NEGATIVE
GLUCOSE UA: NEGATIVE
Ketones, UA: NEGATIVE
NITRITE UA: NEGATIVE
PH UA: 6 (ref 5.0–8.0)
Spec Grav, UA: >=1.03 — AB (ref 1.010–1.025)
UROBILINOGEN UA: NEGATIVE U/dL — AB

## 2016-11-14 MED ORDER — AMOXICILLIN-POT CLAVULANATE 875-125 MG PO TABS: 1.0000 | ORAL_TABLET | Freq: Two times a day (BID) | ORAL | 0 refills | Status: DC

## 2016-11-14 MED ORDER — PHENAZOPYRIDINE HCL 200 MG PO TABS: 200.0000 mg | ORAL_TABLET | Freq: Three times a day (TID) | ORAL | 0 refills | Status: DC | PRN

## 2016-11-14 NOTE — Progress Notes (Signed)
Pre visit review using our clinic review tool, if applicable. No additional management support is needed unless otherwise documented below in the visit note. 

## 2016-11-14 NOTE — Progress Notes (Signed)
Subjective:    [name], female    DOB: Nov 26, 1950, 66 y.o.   MRN: [id number]  HPI  Pt in today reporting urinary symptoms since Sunday.   Dysuria- yes Frequent urination-yes Hesitancy-no Suprapubic pressure-yes chills-no Nausea-no Vomiting-no CVA pain-no History of UTI-very rare. Gross hematuria- pt thinks early on saw blood but last day does not look bloody.  Pt states cranberry juice seemed to help some.  Review of Systems  Constitutional: Negative for chills, fatigue and fever.  Respiratory: Negative for chest tightness and shortness of breath.   Cardiovascular: Negative for chest pain and palpitations.  Gastrointestinal: Negative for abdominal pain.  Genitourinary: Positive for dysuria, frequency and urgency. Negative for decreased urine volume, difficulty urinating, pelvic pain and vaginal pain.  Musculoskeletal: Negative for back pain.  Neurological: Negative for light-headedness.  Hematological: Negative for adenopathy. Does not bruise/bleed easily.  Psychiatric/Behavioral: Negative for behavioral problems and confusion.   Past Medical History:  Diagnosis Date  . Arthritis   . 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 Hypertension      Social History   Social History  . Marital status: Divorced    Spouse name: N/A  . Number of children: 3  . Years of education: 82yrs   Occupational History  . Retired [employer]   Social History Main Topics  . Smoking status: Former Smoker    Years: 4.00    Quit date: 07/30/1979  . Smokeless tobacco: Not on file  . Alcohol use No  . Drug use: No  . Sexual activity: Not Currently    Partners: Male   Other Topics Concern  . Not on file   Social History Narrative   Patient lives at home alone.   Caffeine Use: 1 cup daily   1 dog       Past Surgical History:  Procedure Laterality Date  . CERVICAL SPINE SURGERY     C4  .  CESAREAN SECTION    . GASTRIC BYPASS  03/2007  . TONSILLECTOMY      Family History  Problem Relation Age of Onset  . High blood pressure Mother   . Diabetes Mother   . Diabetes Father   . Stroke Father   . Cancer Sister     Colorectal cancer  . Cancer Brother     colon    Allergies  Allergen Reactions  . Tape Rash  . Meperidine Hcl Nausea And Vomiting  . Sulfonamide Derivatives Itching  . Bacitracin-Polymyxin B Dermatitis    "Cloth Band-Aid only"  . Oxycodone Itching    Current Outpatient Prescriptions on File Prior to Visit  Medication Sig Dispense Refill  . albuterol (PROVENTIL HFA;VENTOLIN HFA) 108 (90 Base) MCG/ACT inhaler Inhale 2 puffs into the lungs every 6 (six) hours as needed.    Marland Kitchen amLODipine (NORVASC) 10 MG tablet Take 1 tablet (10 mg total) by mouth daily. 90 tablet 1  . aspirin 81 MG tablet Take 81 mg by mouth daily.    . Cholecalciferol (VITAMIN D3) 1000 units CAPS Take by mouth.    . cyclobenzaprine (FLEXERIL) 10 MG tablet Take 10 mg by mouth 3 (three) times daily as needed.    Marland Kitchen levothyroxine (SYNTHROID, LEVOTHROID) 100 MCG tablet Take 1 tablet (100 mcg total) by mouth daily. 90 tablet 0  . losartan (COZAAR) 100 MG tablet Take 1 tablet (100 mg total) by mouth daily. 90 tablet 1  . spironolactone (  ALDACTONE) 25 MG tablet Take 0.5 tablets (12.5 mg total) by mouth daily. 45 tablet 1  . traMADol (ULTRAM) 50 MG tablet Take 100 mg by mouth 2 (two) times daily as needed.    . traZODone (DESYREL) 50 MG tablet Take 1/2 to 1 tablet at night as needed 90 tablet 1   No current facility-administered medications on file prior to visit.     BP 139/87 (BP Location: Left Arm, Patient Position: Sitting, Cuff Size: Large)   Pulse 65   Temp 97.5 F (36.4 C) (Oral)   Resp 16   Ht  (1.626 m)   Wt 210 lb 3.2 oz (95.3 kg)   SpO2 98%   BMI 36.08 kg/m       Objective:   Physical Exam  General Appearance- Not in acute distress.  HEENT Eyes-  Scleraeral/Conjuntiva-bilat- Not Yellow. Mouth & Throat- Normal.  Chest and Lung Exam Auscultation: Breath sounds:-Normal. Adventitious sounds:- No Adventitious sounds.  Cardiovascular Auscultation:Rythm - Regular. Heart Sounds -Normal heart sounds.  Abdomen Inspection:-Inspection Normal.  Palpation/Perucssion: Palpation and Percussion of the abdomen reveal- no suprapubic Tenderness(has only when urinates), No Rebound tenderness, No rigidity(Guarding) and No Palpable abdominal masses.  Liver:-Normal.  Spleen:- Normal.   Back- no cva pain.      Assessment & Plan:  You appear to have a urinary tract infection. I am prescribing augmentin  antibiotic for the probable infection. Hydrate well. I am sending out a urine culture. During the interim if your signs and symptoms worsen rather than improving please notify us. We will notify your when the culture results are back.  Pyridium rx for pain  Follow up in 7 days or as needed.  pcn based chosen to cover ecoli. Will follow culture. Thought of macrobid initially and epic beers warning. Want to avoid quinilone side effect. Follow C and S.

## 2016-11-14 NOTE — Addendum Note (Signed)
Addended by: Harley Alto on: 11/14/2016 04:51 PM   Modules accepted: Orders

## 2016-11-14 NOTE — Patient Instructions (Addendum)
You appear to have a urinary tract infection. I am prescribing augmentin  antibiotic for the probable infection. Hydrate well. I am sending out a urine culture. During the interim if your signs and symptoms worsen rather than improving please notify us. We will notify your when the culture results are back.  Pyridium rx for pain  Follow up in 7 days or as needed.

## 2016-11-16 LAB — URINE CULTURE

## 2016-11-19 ENCOUNTER — Encounter: Payer: Self-pay | Admitting: Family Medicine

## 2016-11-19 ENCOUNTER — Ambulatory Visit (INDEPENDENT_AMBULATORY_CARE_PROVIDER_SITE_OTHER): Payer: Medicare HMO | Admitting: Family Medicine

## 2016-11-19 VITALS — BP 109/65 | HR 80 | Temp 97.7°F | Resp 17 | Ht 64.0 in | Wt 208.6 lb

## 2016-11-19 DIAGNOSIS — E039 Hypothyroidism, unspecified: Secondary | ICD-10-CM

## 2016-11-19 DIAGNOSIS — I10 Essential (primary) hypertension: Secondary | ICD-10-CM

## 2016-11-19 DIAGNOSIS — N183 Chronic kidney disease, stage 3 unspecified: Secondary | ICD-10-CM

## 2016-11-19 DIAGNOSIS — Z01818 Encounter for other preprocedural examination: Secondary | ICD-10-CM | POA: Insufficient documentation

## 2016-11-19 DIAGNOSIS — M4316 Spondylolisthesis, lumbar region: Secondary | ICD-10-CM

## 2016-11-19 DIAGNOSIS — G4733 Obstructive sleep apnea (adult) (pediatric): Secondary | ICD-10-CM | POA: Diagnosis not present

## 2016-11-19 DIAGNOSIS — R82998 Other abnormal findings in urine: Secondary | ICD-10-CM

## 2016-11-19 DIAGNOSIS — R8299 Other abnormal findings in urine: Secondary | ICD-10-CM

## 2016-11-19 LAB — CBC WITH DIFFERENTIAL/PLATELET
BASOS ABS: 0 10*3/uL (ref 0.0–0.1)
BASOS PCT: 0.4 % (ref 0.0–3.0)
Eosinophils Absolute: 0.2 10*3/uL (ref 0.0–0.7)
Eosinophils Relative: 4 % (ref 0.0–5.0)
HEMATOCRIT: 39.5 % (ref 36.0–46.0)
HEMOGLOBIN: 13.1 g/dL (ref 12.0–15.0)
LYMPHS PCT: 46.9 % — AB (ref 12.0–46.0)
Lymphs Abs: 2.1 10*3/uL (ref 0.7–4.0)
MCHC: 33.3 g/dL (ref 30.0–36.0)
MCV: 91.9 fl (ref 78.0–100.0)
MONOS PCT: 8.1 % (ref 3.0–12.0)
Monocytes Absolute: 0.4 10*3/uL (ref 0.1–1.0)
NEUTROS ABS: 1.8 10*3/uL (ref 1.4–7.7)
Neutrophils Relative %: 40.6 % — ABNORMAL LOW (ref 43.0–77.0)
PLATELETS: 169 10*3/uL (ref 150.0–400.0)
RBC: 4.29 Mil/uL (ref 3.87–5.11)
RDW: 13.9 % (ref 11.5–15.5)
WBC: 4.4 10*3/uL (ref 4.0–10.5)

## 2016-11-19 LAB — POC URINALSYSI DIPSTICK (AUTOMATED)
GLUCOSE UA: NEGATIVE
Ketones, UA: NEGATIVE
Leukocytes, UA: NEGATIVE
NITRITE UA: POSITIVE
RBC UA: NEGATIVE
Spec Grav, UA: >=1.03 — AB (ref 1.010–1.025)
Urobilinogen, UA: 1 E.U./dL
pH, UA: 6 (ref 5.0–8.0)

## 2016-11-19 LAB — COMPREHENSIVE METABOLIC PANEL
ALBUMIN: 3.6 g/dL (ref 3.5–5.2)
ALT: 43 U/L — AB (ref 0–35)
AST: 54 U/L — AB (ref 0–37)
Alkaline Phosphatase: 98 U/L (ref 39–117)
BUN: 14 mg/dL (ref 6–23)
CALCIUM: 9.2 mg/dL (ref 8.4–10.5)
CHLORIDE: 107 meq/L (ref 96–112)
CO2: 29 mEq/L (ref 19–32)
Creatinine, Ser: 1.05 mg/dL (ref 0.40–1.20)
GFR: 67.38 mL/min (ref >60.00)
Glucose, Bld: 84 mg/dL (ref 70–99)
POTASSIUM: 3.8 meq/L (ref 3.5–5.1)
Sodium: 140 mEq/L (ref 135–145)
TOTAL PROTEIN: 7.3 g/dL (ref 6.0–8.3)
Total Bilirubin: 0.6 mg/dL (ref 0.2–1.2)

## 2016-11-19 LAB — CBC
HCT: 39.5 % (ref 36.0–46.0)
Hemoglobin: 13.1 g/dL (ref 12.0–15.0)
MCHC: 33.3 g/dL (ref 30.0–36.0)
MCV: 91.9 fl (ref 78.0–100.0)
PLATELETS: 169 10*3/uL (ref 150.0–400.0)
RBC: 4.29 Mil/uL (ref 3.87–5.11)
RDW: 13.9 % (ref 11.5–15.5)
WBC: 4.4 10*3/uL (ref 4.0–10.5)

## 2016-11-19 LAB — PROTIME-INR
INR: 1.1 ratio — ABNORMAL HIGH (ref 0.8–1.0)
PROTHROMBIN TIME: 11.6 s (ref 9.6–13.1)

## 2016-11-19 LAB — LIPID PANEL
CHOLESTEROL: 134 mg/dL (ref 0–200)
HDL: 41.8 mg/dL (ref >39.00)
LDL CALC: 78 mg/dL (ref 0–99)
NonHDL: 91.87
TRIGLYCERIDES: 70 mg/dL (ref 0.0–149.0)
Total CHOL/HDL Ratio: 3
VLDL: 14 mg/dL (ref 0.0–40.0)

## 2016-11-19 LAB — APTT: APTT: 30.1 s (ref 23.4–32.7)

## 2016-11-19 LAB — TSH: TSH: 1.71 u[IU]/mL (ref 0.35–4.50)

## 2016-11-19 NOTE — Assessment & Plan Note (Signed)
Stable  con't meds Well controlled, no changes to meds. Encouraged heart healthy diet such as the DASH diet and exercise as tolerated.  

## 2016-11-19 NOTE — Assessment & Plan Note (Signed)
Check labs 

## 2016-11-19 NOTE — Assessment & Plan Note (Signed)
Pt cleared medically Cardiology pending

## 2016-11-19 NOTE — Patient Instructions (Addendum)
You are medically cleared for surgery Cardiac clearance is pending

## 2016-11-19 NOTE — Assessment & Plan Note (Signed)
Surgery per ortho Cleared medically Cardiology clearance pending--- ekg done -- pt has pacemaker

## 2016-11-19 NOTE — Progress Notes (Signed)
[name], female    DOB: 1951/01/13  Age: 66 y.o. MRN: [id number]    Subjective:  Subjective  HPI [name] presents for surgical clearance for back surgery.  Pt has an appointment with cardiology next week for clearance as well.   No other complaints.    Review of Systems  Constitutional: Negative for activity change, appetite change, fatigue and unexpected weight change.  Respiratory: Negative for cough and shortness of breath.   Cardiovascular: Negative for chest pain and palpitations.  Musculoskeletal: Positive for back pain. Negative for gait problem, joint swelling, myalgias, neck pain and neck stiffness.  Psychiatric/Behavioral: Negative for behavioral problems and dysphoric mood. The patient is not nervous/anxious.     History Past Medical History:  Diagnosis Date  . Arthritis   . 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 Hypertension     She has a past surgical history that includes Tonsillectomy; Gastric bypass (03/2007); Cervical spine surgery; and Cesarean section.   Her family history includes Cancer in her brother and sister; Diabetes in her father and mother; High blood pressure in her mother; Stroke in her father.She reports that she quit smoking about 37 years ago. She quit after 4.00 years of use. She has never used smokeless tobacco. She reports that she does not drink alcohol or use drugs.  Current Outpatient Prescriptions on File Prior to Visit  Medication Sig Dispense Refill  . albuterol (PROVENTIL HFA;VENTOLIN HFA) 108 (90 Base) MCG/ACT inhaler Inhale 2 puffs into the lungs every 6 (six) hours as needed.    Marland Kitchen amLODipine (NORVASC) 10 MG tablet Take 1 tablet (10 mg total) by mouth daily. 90 tablet 1  . amoxicillin-clavulanate (AUGMENTIN) 875-125 MG tablet Take 1 tablet by mouth 2 (two) times daily. 20 tablet 0  . aspirin 81 MG tablet Take 81 mg by mouth daily.    . Cholecalciferol (VITAMIN D3) 1000  units CAPS Take by mouth.    . cyclobenzaprine (FLEXERIL) 10 MG tablet Take 10 mg by mouth 3 (three) times daily as needed.    Marland Kitchen levothyroxine (SYNTHROID, LEVOTHROID) 100 MCG tablet Take 1 tablet (100 mcg total) by mouth daily. 90 tablet 0  . losartan (COZAAR) 100 MG tablet Take 1 tablet (100 mg total) by mouth daily. 90 tablet 1  . phenazopyridine (PYRIDIUM) 200 MG tablet Take 1 tablet (200 mg total) by mouth 3 (three) times daily as needed for pain. 10 tablet 0  . spironolactone (ALDACTONE) 25 MG tablet Take 0.5 tablets (12.5 mg total) by mouth daily. 45 tablet 1  . traMADol (ULTRAM) 50 MG tablet Take 100 mg by mouth 2 (two) times daily as needed.    . traZODone (DESYREL) 50 MG tablet Take 1/2 to 1 tablet at night as needed 90 tablet 1   No current facility-administered medications on file prior to visit.      Objective:  Objective  Physical Exam  Constitutional: She is oriented to person, place, and time. She appears well-developed and well-nourished. No distress.  HENT:  Head: Normocephalic and atraumatic.  Right Ear: External ear normal.  Left Ear: External ear normal.  Nose: Nose normal.  Mouth/Throat: Oropharynx is clear and moist.  Eyes: Conjunctivae and EOM are normal. Pupils are equal, round, and reactive to light. Right eye exhibits no discharge. Left eye exhibits no discharge.  Neck: Normal range of motion. Neck supple. No JVD present. No thyromegaly present.  Cardiovascular: Normal rate, regular rhythm, normal heart sounds  and intact distal pulses.   No murmur heard. Pulmonary/Chest: Effort normal and breath sounds normal. No respiratory distress. She has no wheezes. She has no rales. She exhibits no tenderness.  Abdominal: Soft. Bowel sounds are normal. She exhibits no distension and no mass. There is no tenderness. There is no rebound and no guarding.  Genitourinary: Vagina normal and uterus normal. Rectal exam shows guaiac negative stool. No vaginal discharge found.    Musculoskeletal: She exhibits tenderness. She exhibits no edema.  Lymphadenopathy:    She has no cervical adenopathy.  Neurological: She is alert and oriented to person, place, and time. She has normal reflexes. No cranial nerve deficit.  Skin: Skin is warm and dry. No rash noted. She is not diaphoretic. No erythema.  Psychiatric: She has a normal mood and affect. Her behavior is normal. Judgment and thought content normal.  Nursing note and vitals reviewed.  BP 109/65 (BP Location: Left Arm, Patient Position: Sitting, Cuff Size: Large)   Pulse 80   Temp 97.7 F (36.5 C) (Oral)   Resp 17   Ht  (1.626 m)   Wt 208 lb 9.6 oz (94.6 kg)   SpO2 100%   BMI 35.81 kg/m  Wt Readings from Last 3 Encounters:  11/19/16 208 lb 9.6 oz (94.6 kg)  11/14/16 210 lb 3.2 oz (95.3 kg)  04/29/16 209 lb 3.2 oz (94.9 kg)     Lab Results  Component Value Date   WBC 4.4 11/19/2016   WBC 4.4 11/19/2016   HGB 13.1 11/19/2016   HGB 13.1 11/19/2016   HCT 39.5 11/19/2016   HCT 39.5 11/19/2016   PLT 169.0 11/19/2016   PLT 169.0 11/19/2016   GLUCOSE 84 11/19/2016   CHOL 134 11/19/2016   TRIG 70.0 11/19/2016   HDL 41.80 11/19/2016   LDLCALC 78 11/19/2016   ALT 43 (H) 11/19/2016   AST 54 (H) 11/19/2016   NA 140 11/19/2016   K 3.8 11/19/2016   CL 107 11/19/2016   CREATININE 1.05 11/19/2016   BUN 14 11/19/2016   CO2 29 11/19/2016   TSH 1.71 11/19/2016   INR 1.1 (H) 11/19/2016   HGBA1C 5.8 01/26/2016   MICROALBUR 1.0 01/26/2016    Dg Bone Density  Result Date: 04/04/2016 EXAM: DUAL X-RAY ABSORPTIOMETRY (DXA) FOR BONE MINERAL DENSITY IMPRESSION: Referring Physician:  Lelon Perla CHASE PATIENT: Name: Kathleen, Jensen Patient ID: [id number] Birth Date: [birth year] Height: 62.2 in. Sex: Female Measured: 04/04/2016 Weight: 209.6 lbs. Indications: African-American, Estrogen Deficiency, Hypothyroidism, Post Menopausal, Vitamin D Deficiency Fractures: Foot Treatments: Synthroid ASSESSMENT: The BMD  measured at Femur Neck Right is 0.744 g/cm2 with a T-score of -2.1. This patient is considered OSTEOPENIC according to World Health Organization [facility]) criteria. L-4 was excluded due to degenerative changes. Site Region Measured Date Measured Age WHO YA BMD Classification T-score AP Spine L1-L3 04/04/2016 65.6 Osteopenia -1.1 1.034 g/cm2 DualFemur Neck Right 4/04/2016 65.6 years Osteopenia -2.1 0.744 g/cm2 World Health Organization [facility]) criteria for post-menopausal, Caucasian Women: Normal       T-score at or above -1 SD Osteopenia   T-score between -1 and -2.5 SD Osteoporosis T-score at or below -2.5 SD RECOMMENDATION: National Osteoporosis Foundation recommends that FDA-approved medical therapies be considered in postmenopausal women and men age 41 or older with a: 1. Hip or vertebral (clinical or morphometric) fracture. 2. T-score of < -2.5 at the spine or hip. 3. Ten-year fracture probability by FRAX of 3% or greater for hip fracture or 20% or greater for  major osteoporotic fracture. All treatment decisions require clinical judgment and consideration of individual patient factors, including patient preferences, co-morbidities, previous drug use, risk factors not captured in the FRAX model (e.g. falls, vitamin D deficiency, increased bone turnover, interval significant decline in bone density) and possible under - or over-estimation of fracture risk by FRAX. All patients should ensure an adequate intake of dietary calcium (1200 mg/d) and vitamin D (800 IU daily) unless contraindicated. FOLLOW-UP: People with diagnosed cases of osteoporosis or at high risk for fracture should have regular bone mineral density tests. For patients eligible for Medicare, routine testing is allowed once every 2 years. The testing frequency can be increased to one year for patients who have rapidly progressing disease, those who are receiving or discontinuing medical therapy to restore bone mass, or have additional risk factors. I have  reviewed this report and agree with the above findings. Mark A. Tyron Russell, M.D. [facility] Patient: Ethelda Chick   Referring Physician: Lelon Perla CHASE Birth Date: 1950-12-29 [age] Patient ID: [id number] Height: 62.2 in. Weight: 209.6 lbs. Measured: 04/04/2016 2:54:05 PM (16 SP 2) Sex: Female Ethnicity: African American Analyzed: 04/04/2016 3:13:46 PM (16 SP 2) FRAX* 10-year Probability of Fracture Based on femoral neck BMD: DualFemur (Right) Major Osteoporotic Fracture: 4.6% Hip Fracture:                0.7% Population:                  [place] (Black) Risk Factors:                None *FRAX is a Armed forces logistics/support/administrative officer of the [employer] of [employer] for Metabolic Bone Disease, a World Science writer (WHO) [employer]. ASSESSMENT: The probability of a major osteoporotic fracture is 4.6% within the next ten years. The probability of a hip fracture is 0.7% within the next ten years. I have reviewed this report and agree with the above findings. Mark A. Tyron Russell, M.D. [facility] Electronically Signed   By: Ulyses Southward M.D.   On: 04/04/2016 16:18   Mm Digital Screening Bilateral  Result Date: 04/04/2016 CLINICAL DATA:  Screening. EXAM: DIGITAL SCREENING BILATERAL MAMMOGRAM WITH CAD COMPARISON:  None. ACR Breast Density Category a: The breast tissue is almost entirely fatty. FINDINGS: There are no findings suspicious for malignancy. Images were processed with CAD. IMPRESSION: No mammographic evidence of malignancy. A result letter of this screening mammogram will be mailed directly to the patient. RECOMMENDATION: Screening mammogram in one year. (Code:SM-B-01Y) BI-RADS CATEGORY  1: Negative. Electronically Signed   By: Hulan Saas M.D.   On: 04/05/2016 11:29     Assessment & Plan:  Plan  I am having Ms. Tidwell maintain her aspirin, cyclobenzaprine, Vitamin D3, traMADol, albuterol, amLODipine, losartan, spironolactone, traZODone, levothyroxine,  amoxicillin-clavulanate, and phenazopyridine.  No orders of the defined types were placed in this encounter.   Problem List Items Addressed This Visit      Unprioritized   OBSTRUCTIVE SLEEP APNEA   Essential hypertension    Stable con't meds Well controlled, no changes to meds. Encouraged heart healthy diet such as the DASH diet and exercise as tolerated.       Relevant Orders   CBC (Completed)   Comprehensive metabolic panel (Completed)   Lipid panel (Completed)   TSH (Completed)   CBC with Differential/Platelet (Completed)   Comprehensive metabolic panel   Lipid panel   POCT Urinalysis Dipstick (Automated) (Completed)   INR/PT (Completed)  PTT (Completed)   Hypothyroidism    Check labs      Relevant Orders   TSH   Pre-operative clearance - Primary    Pt cleared medically Cardiology pending        Relevant Orders   CBC (Completed)   Comprehensive metabolic panel (Completed)   Lipid panel (Completed)   TSH (Completed)   EKG 12-Lead (Completed)   CBC with Differential/Platelet (Completed)   Comprehensive metabolic panel   Lipid panel   POCT Urinalysis Dipstick (Automated) (Completed)   INR/PT (Completed)   PTT (Completed)   Spondylolisthesis of lumbar region    Surgery per ortho Cleared medically Cardiology clearance pending--- ekg done -- pt has pacemaker       Other Visit Diagnoses    Other abnormal findings in urine       Relevant Orders   Urine culture      Follow-up: Return in about 6 months (around 05/21/2017), or follow up.  Donato Schultz, DO

## 2016-11-20 ENCOUNTER — Other Ambulatory Visit: Payer: Self-pay | Admitting: Family Medicine

## 2016-11-20 DIAGNOSIS — R748 Abnormal levels of other serum enzymes: Secondary | ICD-10-CM

## 2016-11-20 LAB — URINE CULTURE

## 2016-11-29 DIAGNOSIS — Z950 Presence of cardiac pacemaker: Secondary | ICD-10-CM | POA: Diagnosis not present

## 2016-12-05 ENCOUNTER — Telehealth: Payer: Self-pay | Admitting: *Deleted

## 2016-12-05 NOTE — Telephone Encounter (Signed)
Pt declines at this time. States she will schedule at a later date.

## 2016-12-06 DIAGNOSIS — Z950 Presence of cardiac pacemaker: Secondary | ICD-10-CM | POA: Diagnosis not present

## 2016-12-06 DIAGNOSIS — M5432 Sciatica, left side: Secondary | ICD-10-CM | POA: Diagnosis not present

## 2016-12-06 DIAGNOSIS — M4726 Other spondylosis with radiculopathy, lumbar region: Secondary | ICD-10-CM | POA: Diagnosis not present

## 2016-12-06 DIAGNOSIS — Z7982 Long term (current) use of aspirin: Secondary | ICD-10-CM | POA: Diagnosis not present

## 2016-12-06 DIAGNOSIS — M5431 Sciatica, right side: Secondary | ICD-10-CM | POA: Diagnosis not present

## 2016-12-06 DIAGNOSIS — I10 Essential (primary) hypertension: Secondary | ICD-10-CM | POA: Diagnosis not present

## 2016-12-06 DIAGNOSIS — Z01810 Encounter for preprocedural cardiovascular examination: Secondary | ICD-10-CM | POA: Diagnosis not present

## 2016-12-06 DIAGNOSIS — M4316 Spondylolisthesis, lumbar region: Secondary | ICD-10-CM | POA: Diagnosis not present

## 2016-12-06 DIAGNOSIS — Z4689 Encounter for fitting and adjustment of other specified devices: Secondary | ICD-10-CM | POA: Diagnosis not present

## 2016-12-06 DIAGNOSIS — M545 Low back pain: Secondary | ICD-10-CM | POA: Diagnosis not present

## 2016-12-06 DIAGNOSIS — N183 Chronic kidney disease, stage 3 (moderate): Secondary | ICD-10-CM | POA: Diagnosis not present

## 2016-12-11 DIAGNOSIS — Z01810 Encounter for preprocedural cardiovascular examination: Secondary | ICD-10-CM | POA: Diagnosis not present

## 2016-12-11 DIAGNOSIS — Z01818 Encounter for other preprocedural examination: Secondary | ICD-10-CM | POA: Diagnosis not present

## 2016-12-11 DIAGNOSIS — Z7982 Long term (current) use of aspirin: Secondary | ICD-10-CM | POA: Diagnosis not present

## 2016-12-11 DIAGNOSIS — I10 Essential (primary) hypertension: Secondary | ICD-10-CM | POA: Diagnosis not present

## 2016-12-11 DIAGNOSIS — Z950 Presence of cardiac pacemaker: Secondary | ICD-10-CM | POA: Diagnosis not present

## 2016-12-11 DIAGNOSIS — M4316 Spondylolisthesis, lumbar region: Secondary | ICD-10-CM | POA: Diagnosis not present

## 2016-12-11 DIAGNOSIS — N183 Chronic kidney disease, stage 3 (moderate): Secondary | ICD-10-CM | POA: Diagnosis not present

## 2016-12-12 DIAGNOSIS — N183 Chronic kidney disease, stage 3 (moderate): Secondary | ICD-10-CM | POA: Diagnosis not present

## 2016-12-12 DIAGNOSIS — I10 Essential (primary) hypertension: Secondary | ICD-10-CM | POA: Diagnosis not present

## 2016-12-12 DIAGNOSIS — Z7982 Long term (current) use of aspirin: Secondary | ICD-10-CM | POA: Diagnosis not present

## 2016-12-12 DIAGNOSIS — Z950 Presence of cardiac pacemaker: Secondary | ICD-10-CM | POA: Diagnosis not present

## 2016-12-12 DIAGNOSIS — Z01810 Encounter for preprocedural cardiovascular examination: Secondary | ICD-10-CM | POA: Diagnosis not present

## 2016-12-18 DIAGNOSIS — E039 Hypothyroidism, unspecified: Secondary | ICD-10-CM | POA: Diagnosis not present

## 2016-12-18 DIAGNOSIS — M4316 Spondylolisthesis, lumbar region: Secondary | ICD-10-CM | POA: Diagnosis not present

## 2016-12-18 DIAGNOSIS — Z981 Arthrodesis status: Secondary | ICD-10-CM | POA: Diagnosis not present

## 2016-12-18 DIAGNOSIS — M5432 Sciatica, left side: Secondary | ICD-10-CM | POA: Diagnosis not present

## 2016-12-18 DIAGNOSIS — N183 Chronic kidney disease, stage 3 (moderate): Secondary | ICD-10-CM | POA: Diagnosis not present

## 2016-12-18 DIAGNOSIS — M4800 Spinal stenosis, site unspecified: Secondary | ICD-10-CM | POA: Diagnosis not present

## 2016-12-18 DIAGNOSIS — I129 Hypertensive chronic kidney disease with stage 1 through stage 4 chronic kidney disease, or unspecified chronic kidney disease: Secondary | ICD-10-CM | POA: Diagnosis not present

## 2016-12-18 DIAGNOSIS — Z87891 Personal history of nicotine dependence: Secondary | ICD-10-CM | POA: Diagnosis not present

## 2016-12-18 DIAGNOSIS — F419 Anxiety disorder, unspecified: Secondary | ICD-10-CM | POA: Diagnosis not present

## 2016-12-18 DIAGNOSIS — M4726 Other spondylosis with radiculopathy, lumbar region: Secondary | ICD-10-CM | POA: Diagnosis not present

## 2016-12-18 DIAGNOSIS — Z882 Allergy status to sulfonamides status: Secondary | ICD-10-CM | POA: Diagnosis not present

## 2016-12-18 DIAGNOSIS — M5431 Sciatica, right side: Secondary | ICD-10-CM | POA: Diagnosis not present

## 2016-12-18 DIAGNOSIS — M48062 Spinal stenosis, lumbar region with neurogenic claudication: Secondary | ICD-10-CM | POA: Diagnosis not present

## 2016-12-21 DIAGNOSIS — J45909 Unspecified asthma, uncomplicated: Secondary | ICD-10-CM | POA: Diagnosis not present

## 2016-12-21 DIAGNOSIS — F329 Major depressive disorder, single episode, unspecified: Secondary | ICD-10-CM | POA: Diagnosis not present

## 2016-12-21 DIAGNOSIS — Z87891 Personal history of nicotine dependence: Secondary | ICD-10-CM | POA: Diagnosis not present

## 2016-12-21 DIAGNOSIS — N183 Chronic kidney disease, stage 3 (moderate): Secondary | ICD-10-CM | POA: Diagnosis not present

## 2016-12-21 DIAGNOSIS — Z4789 Encounter for other orthopedic aftercare: Secondary | ICD-10-CM | POA: Diagnosis not present

## 2016-12-21 DIAGNOSIS — Z9884 Bariatric surgery status: Secondary | ICD-10-CM | POA: Diagnosis not present

## 2016-12-21 DIAGNOSIS — I129 Hypertensive chronic kidney disease with stage 1 through stage 4 chronic kidney disease, or unspecified chronic kidney disease: Secondary | ICD-10-CM | POA: Diagnosis not present

## 2016-12-21 DIAGNOSIS — Z950 Presence of cardiac pacemaker: Secondary | ICD-10-CM | POA: Diagnosis not present

## 2016-12-23 DIAGNOSIS — N183 Chronic kidney disease, stage 3 (moderate): Secondary | ICD-10-CM | POA: Diagnosis not present

## 2016-12-23 DIAGNOSIS — Z4789 Encounter for other orthopedic aftercare: Secondary | ICD-10-CM | POA: Diagnosis not present

## 2016-12-23 DIAGNOSIS — Z9884 Bariatric surgery status: Secondary | ICD-10-CM | POA: Diagnosis not present

## 2016-12-23 DIAGNOSIS — Z950 Presence of cardiac pacemaker: Secondary | ICD-10-CM | POA: Diagnosis not present

## 2016-12-23 DIAGNOSIS — I129 Hypertensive chronic kidney disease with stage 1 through stage 4 chronic kidney disease, or unspecified chronic kidney disease: Secondary | ICD-10-CM | POA: Diagnosis not present

## 2016-12-23 DIAGNOSIS — F329 Major depressive disorder, single episode, unspecified: Secondary | ICD-10-CM | POA: Diagnosis not present

## 2016-12-23 DIAGNOSIS — J45909 Unspecified asthma, uncomplicated: Secondary | ICD-10-CM | POA: Diagnosis not present

## 2016-12-23 DIAGNOSIS — Z87891 Personal history of nicotine dependence: Secondary | ICD-10-CM | POA: Diagnosis not present

## 2016-12-25 ENCOUNTER — Other Ambulatory Visit: Payer: Self-pay | Admitting: Family Medicine

## 2016-12-25 DIAGNOSIS — F329 Major depressive disorder, single episode, unspecified: Secondary | ICD-10-CM | POA: Diagnosis not present

## 2016-12-25 DIAGNOSIS — Z9884 Bariatric surgery status: Secondary | ICD-10-CM | POA: Diagnosis not present

## 2016-12-25 DIAGNOSIS — N183 Chronic kidney disease, stage 3 (moderate): Secondary | ICD-10-CM | POA: Diagnosis not present

## 2016-12-25 DIAGNOSIS — Z87891 Personal history of nicotine dependence: Secondary | ICD-10-CM | POA: Diagnosis not present

## 2016-12-25 DIAGNOSIS — J45909 Unspecified asthma, uncomplicated: Secondary | ICD-10-CM | POA: Diagnosis not present

## 2016-12-25 DIAGNOSIS — Z950 Presence of cardiac pacemaker: Secondary | ICD-10-CM | POA: Diagnosis not present

## 2016-12-25 DIAGNOSIS — I159 Secondary hypertension, unspecified: Secondary | ICD-10-CM

## 2016-12-25 DIAGNOSIS — E039 Hypothyroidism, unspecified: Secondary | ICD-10-CM

## 2016-12-25 DIAGNOSIS — Z4789 Encounter for other orthopedic aftercare: Secondary | ICD-10-CM | POA: Diagnosis not present

## 2016-12-25 DIAGNOSIS — I129 Hypertensive chronic kidney disease with stage 1 through stage 4 chronic kidney disease, or unspecified chronic kidney disease: Secondary | ICD-10-CM | POA: Diagnosis not present

## 2016-12-27 DIAGNOSIS — Z4789 Encounter for other orthopedic aftercare: Secondary | ICD-10-CM | POA: Diagnosis not present

## 2016-12-27 DIAGNOSIS — F329 Major depressive disorder, single episode, unspecified: Secondary | ICD-10-CM | POA: Diagnosis not present

## 2016-12-27 DIAGNOSIS — I129 Hypertensive chronic kidney disease with stage 1 through stage 4 chronic kidney disease, or unspecified chronic kidney disease: Secondary | ICD-10-CM | POA: Diagnosis not present

## 2016-12-27 DIAGNOSIS — N183 Chronic kidney disease, stage 3 (moderate): Secondary | ICD-10-CM | POA: Diagnosis not present

## 2016-12-27 DIAGNOSIS — Z87891 Personal history of nicotine dependence: Secondary | ICD-10-CM | POA: Diagnosis not present

## 2016-12-27 DIAGNOSIS — J45909 Unspecified asthma, uncomplicated: Secondary | ICD-10-CM | POA: Diagnosis not present

## 2016-12-27 DIAGNOSIS — Z9884 Bariatric surgery status: Secondary | ICD-10-CM | POA: Diagnosis not present

## 2016-12-27 DIAGNOSIS — Z950 Presence of cardiac pacemaker: Secondary | ICD-10-CM | POA: Diagnosis not present

## 2016-12-30 DIAGNOSIS — Z4789 Encounter for other orthopedic aftercare: Secondary | ICD-10-CM | POA: Diagnosis not present

## 2016-12-30 DIAGNOSIS — Z950 Presence of cardiac pacemaker: Secondary | ICD-10-CM | POA: Diagnosis not present

## 2016-12-30 DIAGNOSIS — I129 Hypertensive chronic kidney disease with stage 1 through stage 4 chronic kidney disease, or unspecified chronic kidney disease: Secondary | ICD-10-CM | POA: Diagnosis not present

## 2016-12-30 DIAGNOSIS — Z87891 Personal history of nicotine dependence: Secondary | ICD-10-CM | POA: Diagnosis not present

## 2016-12-30 DIAGNOSIS — N183 Chronic kidney disease, stage 3 (moderate): Secondary | ICD-10-CM | POA: Diagnosis not present

## 2016-12-30 DIAGNOSIS — F329 Major depressive disorder, single episode, unspecified: Secondary | ICD-10-CM | POA: Diagnosis not present

## 2016-12-30 DIAGNOSIS — J45909 Unspecified asthma, uncomplicated: Secondary | ICD-10-CM | POA: Diagnosis not present

## 2016-12-30 DIAGNOSIS — Z9884 Bariatric surgery status: Secondary | ICD-10-CM | POA: Diagnosis not present

## 2017-01-03 DIAGNOSIS — I129 Hypertensive chronic kidney disease with stage 1 through stage 4 chronic kidney disease, or unspecified chronic kidney disease: Secondary | ICD-10-CM | POA: Diagnosis not present

## 2017-01-03 DIAGNOSIS — F329 Major depressive disorder, single episode, unspecified: Secondary | ICD-10-CM | POA: Diagnosis not present

## 2017-01-03 DIAGNOSIS — J45909 Unspecified asthma, uncomplicated: Secondary | ICD-10-CM | POA: Diagnosis not present

## 2017-01-03 DIAGNOSIS — Z87891 Personal history of nicotine dependence: Secondary | ICD-10-CM | POA: Diagnosis not present

## 2017-01-03 DIAGNOSIS — Z4789 Encounter for other orthopedic aftercare: Secondary | ICD-10-CM | POA: Diagnosis not present

## 2017-01-03 DIAGNOSIS — N183 Chronic kidney disease, stage 3 (moderate): Secondary | ICD-10-CM | POA: Diagnosis not present

## 2017-01-03 DIAGNOSIS — Z9884 Bariatric surgery status: Secondary | ICD-10-CM | POA: Diagnosis not present

## 2017-01-03 DIAGNOSIS — Z950 Presence of cardiac pacemaker: Secondary | ICD-10-CM | POA: Diagnosis not present

## 2017-01-07 DIAGNOSIS — I129 Hypertensive chronic kidney disease with stage 1 through stage 4 chronic kidney disease, or unspecified chronic kidney disease: Secondary | ICD-10-CM | POA: Diagnosis not present

## 2017-01-07 DIAGNOSIS — J45909 Unspecified asthma, uncomplicated: Secondary | ICD-10-CM | POA: Diagnosis not present

## 2017-01-07 DIAGNOSIS — N183 Chronic kidney disease, stage 3 (moderate): Secondary | ICD-10-CM | POA: Diagnosis not present

## 2017-01-07 DIAGNOSIS — Z4789 Encounter for other orthopedic aftercare: Secondary | ICD-10-CM | POA: Diagnosis not present

## 2017-01-08 ENCOUNTER — Telehealth: Payer: Self-pay | Admitting: Family Medicine

## 2017-01-08 DIAGNOSIS — J45909 Unspecified asthma, uncomplicated: Secondary | ICD-10-CM | POA: Diagnosis not present

## 2017-01-08 DIAGNOSIS — Z9884 Bariatric surgery status: Secondary | ICD-10-CM | POA: Diagnosis not present

## 2017-01-08 DIAGNOSIS — F329 Major depressive disorder, single episode, unspecified: Secondary | ICD-10-CM | POA: Diagnosis not present

## 2017-01-08 DIAGNOSIS — Z87891 Personal history of nicotine dependence: Secondary | ICD-10-CM | POA: Diagnosis not present

## 2017-01-08 DIAGNOSIS — Z4789 Encounter for other orthopedic aftercare: Secondary | ICD-10-CM | POA: Diagnosis not present

## 2017-01-08 DIAGNOSIS — I129 Hypertensive chronic kidney disease with stage 1 through stage 4 chronic kidney disease, or unspecified chronic kidney disease: Secondary | ICD-10-CM | POA: Diagnosis not present

## 2017-01-08 DIAGNOSIS — N183 Chronic kidney disease, stage 3 (moderate): Secondary | ICD-10-CM | POA: Diagnosis not present

## 2017-01-08 DIAGNOSIS — Z950 Presence of cardiac pacemaker: Secondary | ICD-10-CM | POA: Diagnosis not present

## 2017-01-08 NOTE — Telephone Encounter (Addendum)
Called patient back. States she has nott had a BM in 3 weeks. Advised 3 days was really the longest ne should wait before seeking treatment for CM. States she tried Miralax and one other form of laxative but there were no results. Advised patient that per Dr. Drue NovelPaz she will need to be seen in the ED for this problem. Patient states she does not feel bad and she has an enema that she will use first before going to ED. Advised the patient of the importance of not having a BM and she should not wait if the enema produces no results. Patient states she will go to ED if no results from enema. Patient denies abdominal pains, chest pain shortness of breath.

## 2017-01-08 NOTE — Telephone Encounter (Signed)
Verbally spoke w/ Dr. Jannifer RodneyPaz-needs triaging, will need to go to ED if fecal impaction.

## 2017-01-08 NOTE — Telephone Encounter (Signed)
.  Caller name: Denton MeekBarwick, Shay   Relation to pt: PT from [facility]  Call back number: [phone]    Reason for call:  Patient had back surgery 3 weeks ago and currently taking colace and miralax intermittently and has not had a bowel movement since surgery, patient is passing gas and PT in need of clinical advice, please advise

## 2017-01-08 NOTE — Telephone Encounter (Signed)
Noted, my concern is possible a fecal impaction

## 2017-01-09 DIAGNOSIS — Z4789 Encounter for other orthopedic aftercare: Secondary | ICD-10-CM | POA: Diagnosis not present

## 2017-01-09 DIAGNOSIS — Z950 Presence of cardiac pacemaker: Secondary | ICD-10-CM | POA: Diagnosis not present

## 2017-01-09 DIAGNOSIS — F329 Major depressive disorder, single episode, unspecified: Secondary | ICD-10-CM | POA: Diagnosis not present

## 2017-01-09 DIAGNOSIS — N183 Chronic kidney disease, stage 3 (moderate): Secondary | ICD-10-CM | POA: Diagnosis not present

## 2017-01-09 DIAGNOSIS — Z87891 Personal history of nicotine dependence: Secondary | ICD-10-CM | POA: Diagnosis not present

## 2017-01-09 DIAGNOSIS — I129 Hypertensive chronic kidney disease with stage 1 through stage 4 chronic kidney disease, or unspecified chronic kidney disease: Secondary | ICD-10-CM | POA: Diagnosis not present

## 2017-01-09 DIAGNOSIS — J45909 Unspecified asthma, uncomplicated: Secondary | ICD-10-CM | POA: Diagnosis not present

## 2017-01-09 DIAGNOSIS — Z9884 Bariatric surgery status: Secondary | ICD-10-CM | POA: Diagnosis not present

## 2017-01-10 DIAGNOSIS — Z9884 Bariatric surgery status: Secondary | ICD-10-CM | POA: Diagnosis not present

## 2017-01-10 DIAGNOSIS — N183 Chronic kidney disease, stage 3 (moderate): Secondary | ICD-10-CM | POA: Diagnosis not present

## 2017-01-10 DIAGNOSIS — Z87891 Personal history of nicotine dependence: Secondary | ICD-10-CM | POA: Diagnosis not present

## 2017-01-10 DIAGNOSIS — Z950 Presence of cardiac pacemaker: Secondary | ICD-10-CM | POA: Diagnosis not present

## 2017-01-10 DIAGNOSIS — I129 Hypertensive chronic kidney disease with stage 1 through stage 4 chronic kidney disease, or unspecified chronic kidney disease: Secondary | ICD-10-CM | POA: Diagnosis not present

## 2017-01-10 DIAGNOSIS — Z4789 Encounter for other orthopedic aftercare: Secondary | ICD-10-CM | POA: Diagnosis not present

## 2017-01-10 DIAGNOSIS — F329 Major depressive disorder, single episode, unspecified: Secondary | ICD-10-CM | POA: Diagnosis not present

## 2017-01-10 DIAGNOSIS — J45909 Unspecified asthma, uncomplicated: Secondary | ICD-10-CM | POA: Diagnosis not present

## 2017-01-13 DIAGNOSIS — Z87891 Personal history of nicotine dependence: Secondary | ICD-10-CM | POA: Diagnosis not present

## 2017-01-13 DIAGNOSIS — N183 Chronic kidney disease, stage 3 (moderate): Secondary | ICD-10-CM | POA: Diagnosis not present

## 2017-01-13 DIAGNOSIS — Z9884 Bariatric surgery status: Secondary | ICD-10-CM | POA: Diagnosis not present

## 2017-01-13 DIAGNOSIS — Z950 Presence of cardiac pacemaker: Secondary | ICD-10-CM | POA: Diagnosis not present

## 2017-01-13 DIAGNOSIS — J45909 Unspecified asthma, uncomplicated: Secondary | ICD-10-CM | POA: Diagnosis not present

## 2017-01-13 DIAGNOSIS — Z4789 Encounter for other orthopedic aftercare: Secondary | ICD-10-CM | POA: Diagnosis not present

## 2017-01-13 DIAGNOSIS — F329 Major depressive disorder, single episode, unspecified: Secondary | ICD-10-CM | POA: Diagnosis not present

## 2017-01-13 DIAGNOSIS — I129 Hypertensive chronic kidney disease with stage 1 through stage 4 chronic kidney disease, or unspecified chronic kidney disease: Secondary | ICD-10-CM | POA: Diagnosis not present

## 2017-01-14 DIAGNOSIS — M4726 Other spondylosis with radiculopathy, lumbar region: Secondary | ICD-10-CM | POA: Diagnosis not present

## 2017-01-17 DIAGNOSIS — Z4789 Encounter for other orthopedic aftercare: Secondary | ICD-10-CM | POA: Diagnosis not present

## 2017-01-17 DIAGNOSIS — F329 Major depressive disorder, single episode, unspecified: Secondary | ICD-10-CM | POA: Diagnosis not present

## 2017-01-17 DIAGNOSIS — Z9884 Bariatric surgery status: Secondary | ICD-10-CM | POA: Diagnosis not present

## 2017-01-17 DIAGNOSIS — Z87891 Personal history of nicotine dependence: Secondary | ICD-10-CM | POA: Diagnosis not present

## 2017-01-17 DIAGNOSIS — I129 Hypertensive chronic kidney disease with stage 1 through stage 4 chronic kidney disease, or unspecified chronic kidney disease: Secondary | ICD-10-CM | POA: Diagnosis not present

## 2017-01-17 DIAGNOSIS — Z950 Presence of cardiac pacemaker: Secondary | ICD-10-CM | POA: Diagnosis not present

## 2017-01-17 DIAGNOSIS — J45909 Unspecified asthma, uncomplicated: Secondary | ICD-10-CM | POA: Diagnosis not present

## 2017-01-17 DIAGNOSIS — N183 Chronic kidney disease, stage 3 (moderate): Secondary | ICD-10-CM | POA: Diagnosis not present

## 2017-01-20 DIAGNOSIS — I129 Hypertensive chronic kidney disease with stage 1 through stage 4 chronic kidney disease, or unspecified chronic kidney disease: Secondary | ICD-10-CM | POA: Diagnosis not present

## 2017-01-20 DIAGNOSIS — Z9884 Bariatric surgery status: Secondary | ICD-10-CM | POA: Diagnosis not present

## 2017-01-20 DIAGNOSIS — Z4789 Encounter for other orthopedic aftercare: Secondary | ICD-10-CM | POA: Diagnosis not present

## 2017-01-20 DIAGNOSIS — N183 Chronic kidney disease, stage 3 (moderate): Secondary | ICD-10-CM | POA: Diagnosis not present

## 2017-01-20 DIAGNOSIS — F329 Major depressive disorder, single episode, unspecified: Secondary | ICD-10-CM | POA: Diagnosis not present

## 2017-01-20 DIAGNOSIS — J45909 Unspecified asthma, uncomplicated: Secondary | ICD-10-CM | POA: Diagnosis not present

## 2017-01-20 DIAGNOSIS — Z87891 Personal history of nicotine dependence: Secondary | ICD-10-CM | POA: Diagnosis not present

## 2017-01-20 DIAGNOSIS — Z950 Presence of cardiac pacemaker: Secondary | ICD-10-CM | POA: Diagnosis not present

## 2017-01-22 DIAGNOSIS — Z87891 Personal history of nicotine dependence: Secondary | ICD-10-CM | POA: Diagnosis not present

## 2017-01-22 DIAGNOSIS — I129 Hypertensive chronic kidney disease with stage 1 through stage 4 chronic kidney disease, or unspecified chronic kidney disease: Secondary | ICD-10-CM | POA: Diagnosis not present

## 2017-01-22 DIAGNOSIS — F329 Major depressive disorder, single episode, unspecified: Secondary | ICD-10-CM | POA: Diagnosis not present

## 2017-01-22 DIAGNOSIS — Z9884 Bariatric surgery status: Secondary | ICD-10-CM | POA: Diagnosis not present

## 2017-01-22 DIAGNOSIS — N183 Chronic kidney disease, stage 3 (moderate): Secondary | ICD-10-CM | POA: Diagnosis not present

## 2017-01-22 DIAGNOSIS — Z4789 Encounter for other orthopedic aftercare: Secondary | ICD-10-CM | POA: Diagnosis not present

## 2017-01-22 DIAGNOSIS — J45909 Unspecified asthma, uncomplicated: Secondary | ICD-10-CM | POA: Diagnosis not present

## 2017-01-22 DIAGNOSIS — Z950 Presence of cardiac pacemaker: Secondary | ICD-10-CM | POA: Diagnosis not present

## 2017-02-17 ENCOUNTER — Other Ambulatory Visit: Payer: Self-pay | Admitting: Family Medicine

## 2017-02-17 DIAGNOSIS — G4700 Insomnia, unspecified: Secondary | ICD-10-CM

## 2017-02-17 DIAGNOSIS — I10 Essential (primary) hypertension: Secondary | ICD-10-CM

## 2017-02-17 DIAGNOSIS — I159 Secondary hypertension, unspecified: Secondary | ICD-10-CM

## 2017-02-26 ENCOUNTER — Other Ambulatory Visit: Payer: Self-pay | Admitting: Family Medicine

## 2017-02-26 DIAGNOSIS — I159 Secondary hypertension, unspecified: Secondary | ICD-10-CM

## 2017-02-26 DIAGNOSIS — E039 Hypothyroidism, unspecified: Secondary | ICD-10-CM

## 2017-03-05 DIAGNOSIS — Z950 Presence of cardiac pacemaker: Secondary | ICD-10-CM | POA: Diagnosis not present

## 2017-03-13 DIAGNOSIS — M4316 Spondylolisthesis, lumbar region: Secondary | ICD-10-CM | POA: Diagnosis not present

## 2017-03-19 ENCOUNTER — Telehealth: Payer: Self-pay | Admitting: Family Medicine

## 2017-03-19 ENCOUNTER — Other Ambulatory Visit: Payer: Self-pay | Admitting: Family Medicine

## 2017-03-19 DIAGNOSIS — Z1231 Encounter for screening mammogram for malignant neoplasm of breast: Secondary | ICD-10-CM

## 2017-03-19 NOTE — Telephone Encounter (Signed)
Caller name: Gredmarie Relation to pt: self Call back number: [phone] Pharmacy:  Reason for call: Pt stated received a bill for our office stating insurance did not cover since pt did not see her PCP Laury Axon), pt saw Ramon Dredge on 11-14-2016 and her insurance informed her had to have bill resubmitted for the day of service. Please advise.

## 2017-03-20 NOTE — Telephone Encounter (Signed)
Hey Dawn,    Can you look into this one and see if its coded correctly or if this is another credentialing issue? Its is for [facility] 11/14/16

## 2017-03-20 NOTE — Telephone Encounter (Signed)
Looks like [employer] paid on 02/06/17.  Patient has no balance. But [place] is having problem with [employer].  I sent you email yesterday.  Credentialing is working on getting him loaded. Thanks, [employer]

## 2017-03-24 ENCOUNTER — Ambulatory Visit (INDEPENDENT_AMBULATORY_CARE_PROVIDER_SITE_OTHER): Payer: Medicare HMO | Admitting: Family Medicine

## 2017-03-24 ENCOUNTER — Encounter: Payer: Self-pay | Admitting: Family Medicine

## 2017-03-24 VITALS — BP 132/94 | HR 62 | Temp 97.7°F | Wt 198.8 lb

## 2017-03-24 DIAGNOSIS — M25561 Pain in right knee: Secondary | ICD-10-CM | POA: Diagnosis not present

## 2017-03-24 NOTE — Patient Instructions (Signed)

## 2017-03-24 NOTE — Progress Notes (Signed)
[name], female    DOB: [birth year]  Age: 66 y.o. MRN: [id number]    Subjective:  Subjective  HPI Tanishi Nault presents for R knee pain--it started after she got home from her back surgery.  Pain with walking and steps.  She is requesting an ortho referral .     Review of Systems  Constitutional: Negative for fever.  HENT: Negative for congestion.   Respiratory: Negative for shortness of breath.   Cardiovascular: Negative for chest pain, palpitations and leg swelling.  Gastrointestinal: Negative for abdominal pain, blood in stool and nausea.  Genitourinary: Negative for dysuria and frequency.  Musculoskeletal: Positive for arthralgias and joint swelling.  Skin: Negative for rash.  Allergic/Immunologic: Negative for environmental allergies.  Neurological: Negative for dizziness and headaches.  Psychiatric/Behavioral: The patient is not nervous/anxious.     History Past Medical History:  Diagnosis Date  . Arthritis   . 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 Hypertension     She has a past surgical history that includes Tonsillectomy; Gastric bypass (03/2007); Cervical spine surgery; and Cesarean section.   Her family history includes Cancer in her brother and sister; Diabetes in her father and mother; High blood pressure in her mother; Stroke in her father.She reports that she quit smoking about 37 years ago. She quit after 4.00 years of use. She has never used smokeless tobacco. She reports that she does not drink alcohol or use drugs.  Current Outpatient Prescriptions on File Prior to Visit  Medication Sig Dispense Refill  . albuterol (PROVENTIL HFA;VENTOLIN HFA) 108 (90 Base) MCG/ACT inhaler Inhale 2 puffs into the lungs every 6 (six) hours as needed.    Marland Kitchen amLODipine (NORVASC) 10 MG tablet TAKE 1 TABLET EVERY DAY 90 tablet 1  . amoxicillin-clavulanate (AUGMENTIN) 875-125 MG tablet Take 1 tablet by mouth 2 (two) times  daily. 20 tablet 0  . aspirin 81 MG tablet Take 81 mg by mouth daily.    . Cholecalciferol (VITAMIN D3) 1000 units CAPS Take by mouth.    . cyclobenzaprine (FLEXERIL) 10 MG tablet Take 10 mg by mouth 3 (three) times daily as needed.    Marland Kitchen levothyroxine (SYNTHROID, LEVOTHROID) 100 MCG tablet TAKE 1 TABLET EVERY DAY 90 tablet 0  . losartan (COZAAR) 100 MG tablet TAKE 1 TABLET EVERY DAY 90 tablet 1  . phenazopyridine (PYRIDIUM) 200 MG tablet Take 1 tablet (200 mg total) by mouth 3 (three) times daily as needed for pain. 10 tablet 0  . spironolactone (ALDACTONE) 25 MG tablet TAKE 1/2 TABLET EVERY DAY 45 tablet 1  . traMADol (ULTRAM) 50 MG tablet Take 100 mg by mouth 2 (two) times daily as needed.    . traZODone (DESYREL) 50 MG tablet TAKE 1/2 TO 1 TABLET AT NIGHT AS NEEDED 90 tablet 1   No current facility-administered medications on file prior to visit.      Objective:  Objective  Physical Exam  Constitutional: She is oriented to person, place, and time. She appears well-developed and well-nourished.  HENT:  Head: Normocephalic and atraumatic.  Eyes: Conjunctivae and EOM are normal.  Neck: Normal range of motion. Neck supple. No JVD present. Carotid bruit is not present. No thyromegaly present.  Cardiovascular: Normal rate, regular rhythm and normal heart sounds.   No murmur heard. Pulmonary/Chest: Effort normal and breath sounds normal. No respiratory distress. She has no wheezes. She has no rales. She exhibits no tenderness.  Musculoskeletal: She exhibits tenderness.  She exhibits no edema.       Right knee: Tenderness found. Lateral joint line tenderness noted.  Neurological: She is alert and oriented to person, place, and time.  Psychiatric: She has a normal mood and affect.  Nursing note and vitals reviewed.  BP (!) 132/94 Comment: Pt states she has not taken BP medication today  Pulse 62   Temp 97.7 F (36.5 C) (Oral)   Wt 198 lb 12.8 oz (90.2 kg)   SpO2 98%   BMI 34.12 kg/m    Wt Readings from Last 3 Encounters:  03/24/17 198 lb 12.8 oz (90.2 kg)  11/19/16 208 lb 9.6 oz (94.6 kg)  11/14/16 210 lb 3.2 oz (95.3 kg)     Lab Results  Component Value Date   WBC 4.4 11/19/2016   WBC 4.4 11/19/2016   HGB 13.1 11/19/2016   HGB 13.1 11/19/2016   HCT 39.5 11/19/2016   HCT 39.5 11/19/2016   PLT 169.0 11/19/2016   PLT 169.0 11/19/2016   GLUCOSE 84 11/19/2016   CHOL 134 11/19/2016   TRIG 70.0 11/19/2016   HDL 41.80 11/19/2016   LDLCALC 78 11/19/2016   ALT 43 (H) 11/19/2016   AST 54 (H) 11/19/2016   NA 140 11/19/2016   K 3.8 11/19/2016   CL 107 11/19/2016   CREATININE 1.05 11/19/2016   BUN 14 11/19/2016   CO2 29 11/19/2016   TSH 1.71 11/19/2016   INR 1.1 (H) 11/19/2016   HGBA1C 5.8 01/26/2016   MICROALBUR 1.0 01/26/2016    Dg Bone Density  Result Date: 04/04/2016 EXAM: DUAL X-RAY ABSORPTIOMETRY (DXA) FOR BONE MINERAL DENSITY IMPRESSION: Referring Physician:  Lelon Perla CHASE PATIENT: Name: Onyinyechi, Huante Patient ID: [id number] Birth Date: Aug 31, 1950 Height: 62.2 in. Sex: Female Measured: 04/04/2016 Weight: 209.6 lbs. Indications: African-American, Estrogen Deficiency, Hypothyroidism, Post Menopausal, Vitamin D Deficiency Fractures: Foot Treatments: Synthroid ASSESSMENT: The BMD measured at Femur Neck Right is 0.744 g/cm2 with a T-score of -2.1. This patient is considered OSTEOPENIC according to World Health Organization [facility]) criteria. L-4 was excluded due to degenerative changes. Site Region Measured Date Measured Age WHO YA BMD Classification T-score AP Spine L1-L3 04/04/2016 65.6 Osteopenia -1.1 1.034 g/cm2 DualFemur Neck Right 04/04/2016 65.6 years Osteopenia -2.1 0.744 g/cm2 World Health Organization [facility]) criteria for post-menopausal, Caucasian Women: Normal       T-score at or above -1 SD Osteopenia   T-score between -1 and -2.5 SD Osteoporosis T-score at or below -2.5 SD RECOMMENDATION: National Osteoporosis Foundation recommends that FDA-approved medical  therapies be considered in postmenopausal women and men age 64 or older with a: 1. Hip or vertebral (clinical or morphometric) fracture. 2. T-score of < -2.5 at the spine or hip. 3. Ten-year fracture probability by FRAX of 3% or greater for hip fracture or 20% or greater for major osteoporotic fracture. All treatment decisions require clinical judgment and consideration of individual patient factors, including patient preferences, co-morbidities, previous drug use, risk factors not captured in the FRAX model (e.g. falls, vitamin D deficiency, increased bone turnover, interval significant decline in bone density) and possible under - or over-estimation of fracture risk by FRAX. All patients should ensure an adequate intake of dietary calcium (1200 mg/d) and vitamin D (800 IU daily) unless contraindicated. FOLLOW-UP: People with diagnosed cases of osteoporosis or at high risk for fracture should have regular bone mineral density tests. For patients eligible for Medicare, routine testing is allowed once every 2 years. The testing frequency can be increased to one year  for patients who have rapidly progressing disease, those who are receiving or discontinuing medical therapy to restore bone mass, or have additional risk factors. I have reviewed this report and agree with the above findings. Mark A. Tyron Russell, M.D. [facility] Patient: Ethelda Chick   Referring Physician: Lelon Perla CHASE Birth Date: 1951/04/21 [age]: [age] Patient ID: [id number] Height: 62.2 in. Weight: 209.6 lbs. Measured: 04/04/2016 2:54:05 PM (16 SP 2) Sex: Female Ethnicity: African American Analyzed: 04/04/2016 3:13:46 PM (16 SP 2) FRAX* 10-year Probability of Fracture Based on femoral neck BMD: DualFemur (Right) Major Osteoporotic Fracture: 4.6% Hip Fracture:                0.7% Population:                  [place] (Black) Risk Factors:                None *FRAX is a Armed forces logistics/support/administrative officer of the [employer] for Metabolic  Bone Disease, a World Science writer (WHO) [employer]. ASSESSMENT: The probability of a major osteoporotic fracture is 4.6% within the next ten years. The probability of a hip fracture is 0.7% within the next ten years. I have reviewed this report and agree with the above findings. Mark A. Tyron Russell, M.D. [facility] Electronically Signed   By: Ulyses Southward M.D.   On: 04/04/2016 16:18   Mm Digital Screening Bilateral  Result Date: 04/04/2016 CLINICAL DATA:  Screening. EXAM: DIGITAL SCREENING BILATERAL MAMMOGRAM WITH CAD COMPARISON:  None. ACR Breast Density Category a: The breast tissue is almost entirely fatty. FINDINGS: There are no findings suspicious for malignancy. Images were processed with CAD. IMPRESSION: No mammographic evidence of malignancy. A result letter of this screening mammogram will be mailed directly to the patient. RECOMMENDATION: Screening mammogram in one year. (Code:SM-B-01Y) BI-RADS CATEGORY  1: Negative. Electronically Signed   By: Hulan Saas M.D.   On: 04/05/2016 11:29     Assessment & Plan:  Plan  I am having Ms. Trenkamp maintain her aspirin, cyclobenzaprine, Vitamin D3, traMADol, albuterol, amoxicillin-clavulanate, phenazopyridine, amLODipine, spironolactone, losartan, traZODone, and levothyroxine.  No orders of the defined types were placed in this encounter.   Problem List Items Addressed This Visit    None    Visit Diagnoses    Acute pain of right knee    -  Primary   Relevant Orders   Ambulatory referral to Orthopedic Surgery   Knee brace    rest, ice  ortho  Follow-up: Return if symptoms worsen or fail to improve.  Donato Schultz, DO

## 2017-03-27 DIAGNOSIS — M25561 Pain in right knee: Secondary | ICD-10-CM | POA: Diagnosis not present

## 2017-03-27 DIAGNOSIS — M1711 Unilateral primary osteoarthritis, right knee: Secondary | ICD-10-CM | POA: Diagnosis not present

## 2017-03-28 DIAGNOSIS — M1711 Unilateral primary osteoarthritis, right knee: Secondary | ICD-10-CM | POA: Diagnosis not present

## 2017-04-01 DIAGNOSIS — M5416 Radiculopathy, lumbar region: Secondary | ICD-10-CM | POA: Diagnosis not present

## 2017-04-18 DIAGNOSIS — M1711 Unilateral primary osteoarthritis, right knee: Secondary | ICD-10-CM | POA: Diagnosis not present

## 2017-04-18 DIAGNOSIS — M5416 Radiculopathy, lumbar region: Secondary | ICD-10-CM | POA: Diagnosis not present

## 2017-04-25 NOTE — Progress Notes (Addendum)
Subjective:   Kathleen Jensen is a 66 y.o. female who presents for Medicare Annual (Subsequent) preventive examination.  Review of Systems:  No ROS.  Medicare Wellness Visit. Additional risk factors are reflected in the social history.  Cardiac Risk Factors include: advanced age (>21men, >67 women);hypertension;obesity (BMI >30kg/m2);sedentary lifestyle Sleep patterns: Takes Trazodone to sleep. Home Safety/Smoke Alarms: Feels safe in home. Smoke alarms in place.    Female:   Pap- Will discuss with PCP at appt 05/23/17.      Mammo-  Scheduled 04/28/17.  Done    Dexa scan- last 04/04/16::osteopenia       CCS- per pt: last 07/29/13-normal    Objective:     Vitals: BP (!) 152/85 (BP Location: Left Arm, Patient Position: Sitting, Cuff Size: Normal)   Pulse 69   Ht  (1.626 m)   Wt 197 lb (89.4 kg)   SpO2 96%   BMI 33.81 kg/m   Body mass index is 33.81 kg/m.   Tobacco History  Smoking Status  . Former Smoker  . Years: 4.00  . Quit date: 07/30/1979  Smokeless Tobacco  . Never Used     Counseling given: Not Answered   Past Medical History:  Diagnosis Date  . Arthritis   . 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 Hypertension    Past Surgical History:  Procedure Laterality Date  . BACK SURGERY     Dec 18, 2016 Dr.Torrealba  . BREAST BIOPSY Right   . BREAST EXCISIONAL BIOPSY    . CERVICAL SPINE SURGERY     C4  . CESAREAN SECTION    . GASTRIC BYPASS  03/2007  . TONSILLECTOMY     Family History  Problem Relation Age of Onset  . High blood pressure Mother   . Diabetes Mother   . Diabetes Father   . Stroke Father   . Cancer Sister        Colorectal cancer  . Cancer Brother        colon   History  Sexual Activity  . Sexual activity: No    Outpatient Encounter Prescriptions as of 04/28/2017  Medication Sig  . amLODipine (NORVASC) 10 MG tablet TAKE 1 TABLET EVERY DAY  . aspirin 81 MG tablet Take 81 mg by mouth daily.   . cyclobenzaprine (FLEXERIL) 10 MG tablet Take 10 mg by mouth 3 (three) times daily as needed.  Marland Kitchen levothyroxine (SYNTHROID, LEVOTHROID) 100 MCG tablet TAKE 1 TABLET EVERY DAY  . losartan (COZAAR) 100 MG tablet TAKE 1 TABLET EVERY DAY  . spironolactone (ALDACTONE) 25 MG tablet TAKE 1/2 TABLET EVERY DAY  . traZODone (DESYREL) 50 MG tablet TAKE 1/2 TO 1 TABLET AT NIGHT AS NEEDED  . albuterol (PROVENTIL HFA;VENTOLIN HFA) 108 (90 Base) MCG/ACT inhaler Inhale 2 puffs into the lungs every 6 (six) hours as needed. (Patient not taking: Reported on 04/28/2017)  . Cholecalciferol (VITAMIN D3) 1000 units CAPS Take by mouth.  . phenazopyridine (PYRIDIUM) 200 MG tablet Take 1 tablet (200 mg total) by mouth 3 (three) times daily as needed for pain. (Patient not taking: Reported on 04/28/2017)  . traMADol (ULTRAM) 50 MG tablet Take 100 mg by mouth 2 (two) times daily as needed.  . [DISCONTINUED] amoxicillin-clavulanate (AUGMENTIN) 875-125 MG tablet Take 1 tablet by mouth 2 (two) times daily.   No facility-administered encounter medications on file as of 04/28/2017.     Activities of Daily Living In your present state of health,  do you have any difficulty performing the following activities: 04/28/2017 04/29/2016  Hearing? N N  Vision? N N  Comment wears contacts. Dr.Foxx yearly -  Difficulty concentrating or making decisions? Y N  Walking or climbing stairs? Malvin Johns  Comment uses cane or walker patient having trouble climbing stairs due pain in back /legs  Dressing or bathing? N N  Doing errands, shopping? N N  Preparing Food and eating ? N -  Using the Toilet? N -  In the past six months, have you accidently leaked urine? N -  Do you have problems with loss of bowel control? N -  Managing your Medications? N -  Managing your Finances? N -  Housekeeping or managing your Housekeeping? N -  Some recent data might be hidden    Patient Care Team: Zola Button, Grayling Congress, DO as PCP - General (Family  Medicine) Blondell Reveal, MD as Consulting Physician (Obstetrics and Gynecology) Titus Dubin. Loletta Parish, MD as Consulting Physician (Pain Medicine)    Assessment:    Physical assessment deferred to PCP.  Exercise Activities and Dietary recommendations Current Exercise Habits: The patient does not participate in regular exercise at present, Exercise limited by: None identified;neurologic condition(s) Diet (meal preparation, eat out, water intake, caffeinated beverages, dairy products, fruits and vegetables): in general, an "unhealthy" diet :      Goals      Patient Stated   . Find solution for right leg pain. (pt-stated)      Fall Risk Fall Risk  04/28/2017 04/29/2016 01/26/2016  Falls in the past year? Yes Yes Yes  Number falls in past yr: 2 or more 1 1  Comment - patient report tripping on step Tripped in the [place]  Injury with Fall? No No -  Follow up Education provided;Falls prevention discussed - -   Depression Screen PHQ 2/9 Scores 04/28/2017 04/29/2016 01/26/2016  PHQ - 2 Score 0 0 0     Cognitive Function MMSE - Mini Mental State Exam 04/28/2017  Orientation to time 5  Orientation to Place 5  Registration 3  Attention/ Calculation 5  Recall 3  Language- name 2 objects 2  Language- repeat 1  Language- follow 3 step command 3  Language- read & follow direction 1  Write a sentence 1  Copy design 1  Total score 30        Immunization History  Administered Date(s) Administered  . Influenza Whole 05/04/2010  . Influenza, High Dose Seasonal PF 04/29/2016  . Influenza-Unspecified 04/29/2015, 04/19/2017  . Pneumococcal-Unspecified 07/29/2013  . Td 07/30/2007   Screening Tests Health Maintenance  Topic Date Due  . Hepatitis C Screening  [birth year]  . PNA vac Low Risk Adult (1 of 2 - PCV13) 08/13/2015  . TETANUS/TDAP  07/29/2017  . MAMMOGRAM  04/04/2018  . COLONOSCOPY  07/29/2018  . INFLUENZA VACCINE  Completed  . DEXA SCAN  Completed      Plan:   Follow  up with Dr.Lowne as scheduled.  Continue to eat heart healthy diet (full of fruits, vegetables, whole grains, lean protein, water--limit salt, fat, and sugar intake) and increase physical activity as tolerated.  Continue doing brain stimulating activities (puzzles, reading, adult coloring books, staying active) to keep memory sharp.   Bring a copy of your living will and/or healthcare power of attorney to your next office visit.   I have personally reviewed and noted the following in the patient's chart:   . Medical and social history . Use of alcohol,  tobacco or illicit drugs  . Current medications and supplements . Functional ability and status . Nutritional status . Physical activity . Advanced directives . List of other physicians . Hospitalizations, surgeries, and ER visits in previous 12 months . Vitals . Screenings to include cognitive, depression, and falls . Referrals and appointments  In addition, I have reviewed and discussed with patient certain preventive protocols, quality metrics, and best practice recommendations. A written personalized care plan for preventive services as well as general preventive health recommendations were provided to patient.     Avon Gully, RN  04/28/2017  I have reviewed the above MWE note by Ms. Moshe Cipro and agree with her documentation  Arthor Captain, MD

## 2017-04-28 ENCOUNTER — Encounter: Payer: Self-pay | Admitting: *Deleted

## 2017-04-28 ENCOUNTER — Encounter (HOSPITAL_BASED_OUTPATIENT_CLINIC_OR_DEPARTMENT_OTHER): Payer: Self-pay

## 2017-04-28 ENCOUNTER — Ambulatory Visit (INDEPENDENT_AMBULATORY_CARE_PROVIDER_SITE_OTHER): Payer: Medicare HMO | Admitting: *Deleted

## 2017-04-28 ENCOUNTER — Ambulatory Visit (HOSPITAL_BASED_OUTPATIENT_CLINIC_OR_DEPARTMENT_OTHER)
Admission: RE | Admit: 2017-04-28 | Discharge: 2017-04-28 | Disposition: A | Discharge status: Home / Self Care | Payer: Medicare HMO | Source: Ambulatory Visit | Attending: Family Medicine | Admitting: Family Medicine

## 2017-04-28 VITALS — BP 152/85 | HR 69 | Ht 64.0 in | Wt 197.0 lb

## 2017-04-28 DIAGNOSIS — Z0000 Encounter for general adult medical examination without abnormal findings: Secondary | ICD-10-CM

## 2017-04-28 DIAGNOSIS — Z1231 Encounter for screening mammogram for malignant neoplasm of breast: Secondary | ICD-10-CM | POA: Insufficient documentation

## 2017-04-28 NOTE — Patient Instructions (Signed)
[name] , Thank you for taking time to come for your Medicare Wellness Visit. I appreciate your ongoing commitment to your health goals. Please review the following plan we discussed and let me know if I can assist you in the future.   These are the goals we discussed: Goals      Patient Stated   . Find solution for right leg pain. (pt-stated)       This is a list of the screening recommended for you and due dates:  Health Maintenance  Topic Date Due  .  Hepatitis C: One time screening is recommended by Center for Disease Control  (CDC) for  adults born from [birth year] through 1965.   January 02, 1951  . Pneumonia vaccines (1 of 2 - PCV13) 08/13/2015  . Tetanus Vaccine  07/29/2017  . Mammogram  04/04/2018  . Colon Cancer Screening  07/29/2018  . Flu Shot  Completed  . DEXA scan (bone density measurement)  Completed   Continue to eat heart healthy diet (full of fruits, vegetables, whole grains, lean protein, water--limit salt, fat, and sugar intake) and increase physical activity as tolerated.  Continue doing brain stimulating activities (puzzles, reading, adult coloring books, staying active) to keep memory sharp.   Bring a copy of your living will and/or healthcare power of attorney to your next office visit.  Health Maintenance, Female Adopting a healthy lifestyle and getting preventive care can go a long way to promote health and wellness. Talk with your health care provider about what schedule of regular examinations is right for you. This is a good chance for you to check in with your provider about disease prevention and staying healthy. In between checkups, there are plenty of things you can do on your own. Experts have done a lot of research about which lifestyle changes and preventive measures are most likely to keep you healthy. Ask your health care provider for more information. Weight and diet Eat a healthy diet  Be sure to include plenty of vegetables, fruits, low-fat dairy  products, and lean protein.  Do not eat a lot of foods high in solid fats, added sugars, or salt.  Get regular exercise. This is one of the most important things you can do for your health. ? Most adults should exercise for at least 150 minutes each week. The exercise should increase your heart rate and make you sweat (moderate-intensity exercise). ? Most adults should also do strengthening exercises at least twice a week. This is in addition to the moderate-intensity exercise.  Maintain a healthy weight  Body mass index (BMI) is a measurement that can be used to identify possible weight problems. It estimates body fat based on height and weight. Your health care provider can help determine your BMI and help you achieve or maintain a healthy weight.  For females 58 years of age and older: ? A BMI below 18.5 is considered underweight. ? A BMI of 18.5 to 24.9 is normal. ? A BMI of 25 to 29.9 is considered overweight. ? A BMI of 30 and above is considered obese.  Watch levels of cholesterol and blood lipids  You should start having your blood tested for lipids and cholesterol at 66 years of age, then have this test every 5 years.  You may need to have your cholesterol levels checked more often if: ? Your lipid or cholesterol levels are high. ? You are older than 66 years of age. ? You are at high risk for heart disease.  Cancer screening Lung Cancer  Lung cancer screening is recommended for adults 42-25 years old who are at high risk for lung cancer because of a history of smoking.  A yearly low-dose CT scan of the lungs is recommended for people who: ? Currently smoke. ? Have quit within the past 15 years. ? Have at least a 30-pack-year history of smoking. A pack year is smoking an average of one pack of cigarettes a day for 1 year.  Yearly screening should continue until it has been 15 years since you quit.  Yearly screening should stop if you develop a health problem that would  prevent you from having lung cancer treatment.  Breast Cancer  Practice breast self-awareness. This means understanding how your breasts normally appear and feel.  It also means doing regular breast self-exams. Let your health care provider know about any changes, no matter how small.  If you are in your 20s or 30s, you should have a clinical breast exam (CBE) by a health care provider every 1-3 years as part of a regular health exam.  If you are 43 or older, have a CBE every year. Also consider having a breast X-ray (mammogram) every year.  If you have a family history of breast cancer, talk to your health care provider about genetic screening.  If you are at high risk for breast cancer, talk to your health care provider about having an MRI and a mammogram every year.  Breast cancer gene (BRCA) assessment is recommended for women who have family members with BRCA-related cancers. BRCA-related cancers include: ? Breast. ? Ovarian. ? Tubal. ? Peritoneal cancers.  Results of the assessment will determine the need for genetic counseling and BRCA1 and BRCA2 testing.  Cervical Cancer Your health care provider may recommend that you be screened regularly for cancer of the pelvic organs (ovaries, uterus, and vagina). This screening involves a pelvic examination, including checking for microscopic changes to the surface of your cervix (Pap test). You may be encouraged to have this screening done every 3 years, beginning at age 12.  For women ages 72-65, health care providers may recommend pelvic exams and Pap testing every 3 years, or they may recommend the Pap and pelvic exam, combined with testing for human papilloma virus (HPV), every 5 years. Some types of HPV increase your risk of cervical cancer. Testing for HPV may also be done on women of any age with unclear Pap test results.  Other health care providers may not recommend any screening for nonpregnant women who are considered low risk  for pelvic cancer and who do not have symptoms. Ask your health care provider if a screening pelvic exam is right for you.  If you have had past treatment for cervical cancer or a condition that could lead to cancer, you need Pap tests and screening for cancer for at least 20 years after your treatment. If Pap tests have been discontinued, your risk factors (such as having a new sexual partner) need to be reassessed to determine if screening should resume. Some women have medical problems that increase the chance of getting cervical cancer. In these cases, your health care provider may recommend more frequent screening and Pap tests.  Colorectal Cancer  This type of cancer can be detected and often prevented.  Routine colorectal cancer screening usually begins at 66 years of age and continues through 66 years of age.  Your health care provider may recommend screening at an earlier age if you have risk factors  for colon cancer.  Your health care provider may also recommend using home test kits to check for hidden blood in the stool.  A small camera at the end of a tube can be used to examine your colon directly (sigmoidoscopy or colonoscopy). This is done to check for the earliest forms of colorectal cancer.  Routine screening usually begins at age 32.  Direct examination of the colon should be repeated every 5-10 years through 66 years of age. However, you may need to be screened more often if early forms of precancerous polyps or small growths are found.  Skin Cancer  Check your skin from head to toe regularly.  Tell your health care provider about any new moles or changes in moles, especially if there is a change in a mole's shape or color.  Also tell your health care provider if you have a mole that is larger than the size of a pencil eraser.  Always use sunscreen. Apply sunscreen liberally and repeatedly throughout the day.  Protect yourself by wearing long sleeves, pants, a  wide-brimmed hat, and sunglasses whenever you are outside.  Heart disease, diabetes, and high blood pressure  High blood pressure causes heart disease and increases the risk of stroke. High blood pressure is more likely to develop in: ? People who have blood pressure in the high end of the normal range (130-139/85-89 mm Hg). ? People who are overweight or obese. ? People who are African American.  If you are 5-20 years of age, have your blood pressure checked every 3-5 years. If you are 69 years of age or older, have your blood pressure checked every year. You should have your blood pressure measured twice-once when you are at a hospital or clinic, and once when you are not at a hospital or clinic. Record the average of the two measurements. To check your blood pressure when you are not at a hospital or clinic, you can use: ? An automated blood pressure machine at a pharmacy. ? A home blood pressure monitor.  If you are between 15 years and 36 years old, ask your health care provider if you should take aspirin to prevent strokes.  Have regular diabetes screenings. This involves taking a blood sample to check your fasting blood sugar level. ? If you are at a normal weight and have a low risk for diabetes, have this test once every three years after 66 years of age. ? If you are overweight and have a high risk for diabetes, consider being tested at a younger age or more often. Preventing infection Hepatitis B  If you have a higher risk for hepatitis B, you should be screened for this virus. You are considered at high risk for hepatitis B if: ? You were born in a country where hepatitis B is common. Ask your health care provider which countries are considered high risk. ? Your parents were born in a high-risk country, and you have not been immunized against hepatitis B (hepatitis B vaccine). ? You have HIV or AIDS. ? You use needles to inject street drugs. ? You live with someone who has  hepatitis B. ? You have had sex with someone who has hepatitis B. ? You get hemodialysis treatment. ? You take certain medicines for conditions, including cancer, organ transplantation, and autoimmune conditions.  Hepatitis C  Blood testing is recommended for: ? Everyone born from [birth year] through 1965. ? Anyone with known risk factors for hepatitis C.  Sexually transmitted infections (STIs)  You should be screened for sexually transmitted infections (STIs) including gonorrhea and chlamydia if: ? You are sexually active and are younger than 67 years of age. ? You are older than 66 years of age and your health care provider tells you that you are at risk for this type of infection. ? Your sexual activity has changed since you were last screened and you are at an increased risk for chlamydia or gonorrhea. Ask your health care provider if you are at risk.  If you do not have HIV, but are at risk, it may be recommended that you take a prescription medicine daily to prevent HIV infection. This is called pre-exposure prophylaxis (PrEP). You are considered at risk if: ? You are sexually active and do not regularly use condoms or know the HIV status of your partner(s). ? You take drugs by injection. ? You are sexually active with a partner who has HIV.  Talk with your health care provider about whether you are at high risk of being infected with HIV. If you choose to begin PrEP, you should first be tested for HIV. You should then be tested every 3 months for as long as you are taking PrEP. Pregnancy  If you are premenopausal and you may become pregnant, ask your health care provider about preconception counseling.  If you may become pregnant, take 400 to 800 micrograms (mcg) of folic acid every day.  If you want to prevent pregnancy, talk to your health care provider about birth control (contraception). Osteoporosis and menopause  Osteoporosis is a disease in which the bones lose minerals and  strength with aging. This can result in serious bone fractures. Your risk for osteoporosis can be identified using a bone density scan.  If you are 45 years of age or older, or if you are at risk for osteoporosis and fractures, ask your health care provider if you should be screened.  Ask your health care provider whether you should take a calcium or vitamin D supplement to lower your risk for osteoporosis.  Menopause may have certain physical symptoms and risks.  Hormone replacement therapy may reduce some of these symptoms and risks. Talk to your health care provider about whether hormone replacement therapy is right for you. Follow these instructions at home:  Schedule regular health, dental, and eye exams.  Stay current with your immunizations.  Do not use any tobacco products including cigarettes, chewing tobacco, or electronic cigarettes.  If you are pregnant, do not drink alcohol.  If you are breastfeeding, limit how much and how often you drink alcohol.  Limit alcohol intake to no more than 1 drink per day for nonpregnant women. One drink equals 12 ounces of beer, 5 ounces of wine, or 1 ounces of hard liquor.  Do not use street drugs.  Do not share needles.  Ask your health care provider for help if you need support or information about quitting drugs.  Tell your health care provider if you often feel depressed.  Tell your health care provider if you have ever been abused or do not feel safe at home. This information is not intended to replace advice given to you by your health care provider. Make sure you discuss any questions you have with your health care provider. Document Released: 01/28/2011 Document Revised: 12/21/2015 Document Reviewed: 04/18/2015 Elsevier Interactive Patient Education  [employer].

## 2017-05-01 ENCOUNTER — Other Ambulatory Visit: Payer: Self-pay | Admitting: Family Medicine

## 2017-05-01 DIAGNOSIS — M4316 Spondylolisthesis, lumbar region: Secondary | ICD-10-CM | POA: Diagnosis not present

## 2017-05-01 DIAGNOSIS — Z6835 Body mass index (BMI) 35.0-35.9, adult: Secondary | ICD-10-CM | POA: Diagnosis not present

## 2017-05-01 DIAGNOSIS — M4726 Other spondylosis with radiculopathy, lumbar region: Secondary | ICD-10-CM | POA: Diagnosis not present

## 2017-05-01 DIAGNOSIS — I159 Secondary hypertension, unspecified: Secondary | ICD-10-CM

## 2017-05-01 DIAGNOSIS — E039 Hypothyroidism, unspecified: Secondary | ICD-10-CM

## 2017-05-01 DIAGNOSIS — M5431 Sciatica, right side: Secondary | ICD-10-CM | POA: Diagnosis not present

## 2017-05-23 ENCOUNTER — Ambulatory Visit (INDEPENDENT_AMBULATORY_CARE_PROVIDER_SITE_OTHER): Payer: Medicare HMO | Admitting: Family Medicine

## 2017-05-23 ENCOUNTER — Encounter: Payer: Self-pay | Admitting: Family Medicine

## 2017-05-23 VITALS — BP 142/92 | HR 70 | Temp 97.6°F | Ht 64.0 in | Wt 195.0 lb

## 2017-05-23 DIAGNOSIS — I10 Essential (primary) hypertension: Secondary | ICD-10-CM | POA: Diagnosis not present

## 2017-05-23 DIAGNOSIS — R600 Localized edema: Secondary | ICD-10-CM

## 2017-05-23 DIAGNOSIS — G4700 Insomnia, unspecified: Secondary | ICD-10-CM

## 2017-05-23 DIAGNOSIS — E785 Hyperlipidemia, unspecified: Secondary | ICD-10-CM

## 2017-05-23 LAB — LIPID PANEL
CHOLESTEROL: 122 mg/dL (ref 0–200)
HDL: 41.4 mg/dL (ref >39.00)
LDL Cholesterol: 66 mg/dL (ref 0–99)
NONHDL: 81.04
Total CHOL/HDL Ratio: 3
Triglycerides: 74 mg/dL (ref 0.0–149.0)
VLDL: 14.8 mg/dL (ref 0.0–40.0)

## 2017-05-23 LAB — COMPREHENSIVE METABOLIC PANEL
ALBUMIN: 3.7 g/dL (ref 3.5–5.2)
ALK PHOS: 108 U/L (ref 39–117)
ALT: 40 U/L — AB (ref 0–35)
AST: 61 U/L — AB (ref 0–37)
BUN: 14 mg/dL (ref 6–23)
CO2: 30 mEq/L (ref 19–32)
CREATININE: 0.86 mg/dL (ref 0.40–1.20)
Calcium: 9.4 mg/dL (ref 8.4–10.5)
Chloride: 104 mEq/L (ref 96–112)
GFR: 84.7 mL/min (ref >60.00)
GLUCOSE: 85 mg/dL (ref 70–99)
Potassium: 3.5 mEq/L (ref 3.5–5.1)
SODIUM: 139 meq/L (ref 135–145)
TOTAL PROTEIN: 7.3 g/dL (ref 6.0–8.3)
Total Bilirubin: 0.6 mg/dL (ref 0.2–1.2)

## 2017-05-23 MED ORDER — SPIRONOLACTONE 25 MG PO TABS: 25.0000 mg | ORAL_TABLET | Freq: Every day | ORAL | 3 refills | Status: DC

## 2017-05-23 NOTE — Assessment & Plan Note (Addendum)
Poorly controlled will alter medications, encouraged DASH diet, minimize caffeine and obtain adequate sleep. Report concerning symptoms and follow up as directed and as needed Spironolactone added due to edema and bp con't norvasc and losartan  Check labs

## 2017-05-23 NOTE — Assessment & Plan Note (Signed)
Encouraged heart healthy diet, increase exercise, avoid trans fats, consider a krill oil cap daily 

## 2017-05-23 NOTE — Patient Instructions (Signed)

## 2017-05-23 NOTE — Progress Notes (Addendum)
[name], female    DOB: 30-May-1951  Age: 66 y.o. MRN: [id number]    Subjective:  Subjective  HPI Kathleen Jensen presents for f/u bp and cholesterol.  Pt c/o low ext edema --  No sob, no cp  No other complaints  Review of Systems  Constitutional: Negative for appetite change, diaphoresis, fatigue and unexpected weight change.  Eyes: Negative for pain, redness and visual disturbance.  Respiratory: Negative for cough, chest tightness, shortness of breath and wheezing.   Cardiovascular: Positive for leg swelling. Negative for chest pain and palpitations.  Endocrine: Negative for cold intolerance, heat intolerance, polydipsia, polyphagia and polyuria.  Genitourinary: Negative for difficulty urinating, dysuria and frequency.  Neurological: Negative for dizziness, light-headedness, numbness and headaches.    History Past Medical History:  Diagnosis Date  . Arthritis   . 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 Hypertension     She has a past surgical history that includes Tonsillectomy; Gastric bypass (03/2007); Cervical spine surgery; Cesarean section; Breast biopsy (Right); Breast excisional biopsy; and Back surgery.   Her family history includes Cancer in her brother and sister; Diabetes in her father and mother; High blood pressure in her mother; Stroke in her father.She reports that she quit smoking about 37 years ago. She quit after 4.00 years of use. she has never used smokeless tobacco. She reports that she does not drink alcohol or use drugs.  Current Outpatient Medications on File Prior to Visit  Medication Sig Dispense Refill  . amLODipine (NORVASC) 10 MG tablet TAKE 1 TABLET EVERY DAY 90 tablet 1  . aspirin 81 MG tablet Take 81 mg by mouth daily.    . Cholecalciferol (VITAMIN D3) 1000 units CAPS Take by mouth.    . cyclobenzaprine (FLEXERIL) 10 MG tablet Take 10 mg by mouth 3 (three) times daily as needed.    Marland Kitchen  FLUZONE HIGH-DOSE 0.5 ML injection TO BE ADMINISTERED BY PHARMACIST FOR IMMUNIZATION  0  . gabapentin (NEURONTIN) 300 MG capsule     . levothyroxine (SYNTHROID, LEVOTHROID) 100 MCG tablet TAKE 1 TABLET EVERY DAY 90 tablet 0  . losartan (COZAAR) 100 MG tablet TAKE 1 TABLET EVERY DAY 90 tablet 1  . traMADol (ULTRAM) 50 MG tablet Take 100 mg by mouth 2 (two) times daily as needed.    . traZODone (DESYREL) 50 MG tablet TAKE 1/2 TO 1 TABLET AT NIGHT AS NEEDED 90 tablet 1  . LYRICA 50 MG capsule      No current facility-administered medications on file prior to visit.      Objective:  Objective  Physical Exam  Constitutional: She is oriented to person, place, and time. She appears well-developed and well-nourished.  HENT:  Head: Normocephalic and atraumatic.  Eyes: Conjunctivae and EOM are normal.  Neck: Normal range of motion. Neck supple. No JVD present. Carotid bruit is not present. No thyromegaly present.  Cardiovascular: Normal rate, regular rhythm and normal heart sounds.   No murmur heard. Pulmonary/Chest: Effort normal and breath sounds normal. No respiratory distress. She has no wheezes. She has no rales. She exhibits no tenderness.  Musculoskeletal: She exhibits edema.  Neurological: She is alert and oriented to person, place, and time.  Psychiatric: She has a normal mood and affect.  Nursing note and vitals reviewed.  BP (!) 142/92   Pulse 70   Temp 97.6 F (36.4 C) (Oral)   Ht 5\' 4"  (1.626 m)   Wt 195 lb (88.5  kg)   SpO2 98%   BMI 33.47 kg/m  Wt Readings from Last 3 Encounters:  05/23/17 195 lb (88.5 kg)  04/28/17 197 lb (89.4 kg)  03/24/17 198 lb 12.8 oz (90.2 kg)     Lab Results  Component Value Date   WBC 4.4 11/19/2016   WBC 4.4 11/19/2016   HGB 13.1 11/19/2016   HGB 13.1 11/19/2016   HCT 39.5 11/19/2016   HCT 39.5 11/19/2016   PLT 169.0 11/19/2016   PLT 169.0 11/19/2016   GLUCOSE 85 05/23/2017   CHOL 122 05/23/2017   TRIG 74.0 05/23/2017   HDL 41.40  05/23/2017   LDLCALC 66 05/23/2017   ALT 40 (H) 05/23/2017   AST 61 (H) 05/23/2017   NA 139 05/23/2017   K 3.5 05/23/2017   CL 104 05/23/2017   CREATININE 0.86 05/23/2017   BUN 14 05/23/2017   CO2 30 05/23/2017   TSH 1.71 11/19/2016   INR 1.1 (H) 11/19/2016   HGBA1C 5.8 01/26/2016   MICROALBUR 1.0 01/26/2016    Mm Screening Breast Tomo Bilateral  Result Date: 04/29/2017 CLINICAL DATA:  Screening. EXAM: 2D DIGITAL SCREENING BILATERAL MAMMOGRAM WITH CAD AND ADJUNCT TOMO COMPARISON:  Previous exam(s). ACR Breast Density Category a: The breast tissue is almost entirely fatty. FINDINGS: There are no findings suspicious for malignancy. Evaluation of the left axilla is limited due to overlying pacer device. Images were processed with CAD. IMPRESSION: No mammographic evidence of malignancy. A result letter of this screening mammogram will be mailed directly to the patient. RECOMMENDATION: Screening mammogram in one year. (Code:SM-B-01Y) BI-RADS CATEGORY  1: Negative. Electronically Signed   By: Sande BrothersSerena  Chacko M.D.   On: 04/29/2017 12:26     Assessment & Plan:  Plan  I have discontinued Kathleen Jensen's albuterol, phenazopyridine, and spironolactone. I am also having her start on spironolactone. Additionally, I am having her maintain her aspirin, cyclobenzaprine, Vitamin D3, traMADol, amLODipine, losartan, traZODone, levothyroxine, gabapentin, LYRICA, and FLUZONE HIGH-DOSE.  Meds ordered this encounter  Medications  . gabapentin (NEURONTIN) 300 MG capsule  . LYRICA 50 MG capsule  . FLUZONE HIGH-DOSE 0.5 ML injection    Sig: TO BE ADMINISTERED BY PHARMACIST FOR IMMUNIZATION    Refill:  0  . spironolactone (ALDACTONE) 25 MG tablet    Sig: Take 1 tablet (25 mg total) by mouth daily.    Dispense:  90 tablet    Refill:  3    Problem List Items Addressed This Visit      Unprioritized   Essential hypertension - Primary    Poorly controlled will alter medications, encouraged DASH diet,  minimize caffeine and obtain adequate sleep. Report concerning symptoms and follow up as directed and as needed Spironolactone added due to edema and bp con't norvasc and losartan  Check labs      Relevant Medications   spironolactone (ALDACTONE) 25 MG tablet   Other Relevant Orders   Lipid panel (Completed)   Comprehensive metabolic panel (Completed)   Hyperlipidemia LDL goal <100    Encouraged heart healthy diet, increase exercise, avoid trans fats, consider a krill oil cap daily      Relevant Medications   spironolactone (ALDACTONE) 25 MG tablet   Other Relevant Orders   Lipid panel (Completed)   Comprehensive metabolic panel (Completed)   INSOMNIA    Other Visit Diagnoses    Hypertension, unspecified type       Relevant Medications   spironolactone (ALDACTONE) 25 MG tablet   Lower extremity edema  Relevant Medications   spironolactone (ALDACTONE) 25 MG tablet      Follow-up: Return in about 6 months (around 11/21/2017), or if symptoms worsen or fail to improve, for hypertension, hyperlipidemia, annual exam, fasting.  Donato Schultz, DO

## 2017-05-26 ENCOUNTER — Telehealth: Payer: Self-pay | Admitting: Family Medicine

## 2017-05-26 NOTE — Telephone Encounter (Signed)
Pt dropped off document for provider to filled out for pt. (Document is Enrollment Form for Group D Medicines -13 pages) Pt would like document to be processed and to let pt know when done. Document put at at front office tray.

## 2017-05-28 ENCOUNTER — Telehealth: Payer: Self-pay | Admitting: Family Medicine

## 2017-05-28 DIAGNOSIS — R748 Abnormal levels of other serum enzymes: Secondary | ICD-10-CM

## 2017-05-28 NOTE — Telephone Encounter (Signed)
-----   Message from Donato SchultzYvonne R Lowne Chase, DO sent at 05/25/2017  3:49 PM EDT ----- Liver function higher--- any etoh, otc meds , herbal meds? Check us abd

## 2017-05-28 NOTE — Telephone Encounter (Signed)
Called to check the status of Pt's health and to see if she's been taking any supplements and/or drinking. Pt did not answer. Will call again at another time. Left VM for pt to call office back when she receives message.

## 2017-05-28 NOTE — Telephone Encounter (Signed)
Caller name: Valeda Relation to pt: self  Call back number: [phone] or [phone] Pharmacy: [facility] [place] - [id number]  Reason for call: Pt called stating spoke the other day with nurse about her lab results and pt was informed that her liver level was high, pt wanted to inform provider that she think that the liver levels is high since she has been taking aleve her leg pain (she had surgery on her right leg). Pt would like to know what other medication can she take that will not effect her liver levels. Please advise.

## 2017-05-29 NOTE — Telephone Encounter (Signed)
Per Dr Marya LandryLowne Aleve does not cause any changes in the liver, it more kidneys.  She needs to return for repeat blood draw to recheck liver function.  See labs.

## 2017-05-29 NOTE — Telephone Encounter (Signed)
Patient called and stts someone called her but she missed the call/. Adv patient that I will ask a nurse per the above note to please call back @ [phone].   Thanks

## 2017-05-29 NOTE — Telephone Encounter (Signed)
Advised patient of note.  She will be back in town on 11/18.  She will call us to set up US abdomen and come in for lab only for liver function test.  Future order placed.

## 2017-05-30 ENCOUNTER — Telehealth: Payer: Self-pay | Admitting: *Deleted

## 2017-05-30 NOTE — Telephone Encounter (Signed)
Completed as much as possible, collected all requested information with exception of Printed Rx; forwarded to provider/SLS 11/02 Kathleen Jensen, Kathleen Jensen   3:15 PM  Note    Pt dropped off document for provider to filled out for pt. (Document is Enrollment Form for Group D Medicines -13 pages) Pt would like document to be processed and to let pt know when done. Document put at at front office tray.

## 2017-05-31 DIAGNOSIS — I169 Hypertensive crisis, unspecified: Secondary | ICD-10-CM | POA: Diagnosis not present

## 2017-05-31 DIAGNOSIS — R1312 Dysphagia, oropharyngeal phase: Secondary | ICD-10-CM | POA: Diagnosis not present

## 2017-05-31 DIAGNOSIS — Z9989 Dependence on other enabling machines and devices: Secondary | ICD-10-CM | POA: Diagnosis not present

## 2017-05-31 DIAGNOSIS — I10 Essential (primary) hypertension: Secondary | ICD-10-CM | POA: Diagnosis not present

## 2017-05-31 DIAGNOSIS — I609 Nontraumatic subarachnoid hemorrhage, unspecified: Secondary | ICD-10-CM | POA: Diagnosis not present

## 2017-05-31 DIAGNOSIS — G459 Transient cerebral ischemic attack, unspecified: Secondary | ICD-10-CM | POA: Diagnosis not present

## 2017-05-31 DIAGNOSIS — G9389 Other specified disorders of brain: Secondary | ICD-10-CM | POA: Diagnosis not present

## 2017-05-31 DIAGNOSIS — I629 Nontraumatic intracranial hemorrhage, unspecified: Secondary | ICD-10-CM | POA: Diagnosis not present

## 2017-05-31 DIAGNOSIS — I69322 Dysarthria following cerebral infarction: Secondary | ICD-10-CM | POA: Diagnosis not present

## 2017-05-31 DIAGNOSIS — Z7409 Other reduced mobility: Secondary | ICD-10-CM | POA: Diagnosis not present

## 2017-05-31 DIAGNOSIS — A419 Sepsis, unspecified organism: Secondary | ICD-10-CM | POA: Diagnosis not present

## 2017-05-31 DIAGNOSIS — R569 Unspecified convulsions: Secondary | ICD-10-CM | POA: Diagnosis not present

## 2017-05-31 DIAGNOSIS — R1310 Dysphagia, unspecified: Secondary | ICD-10-CM | POA: Diagnosis not present

## 2017-05-31 DIAGNOSIS — N390 Urinary tract infection, site not specified: Secondary | ICD-10-CM | POA: Diagnosis not present

## 2017-05-31 DIAGNOSIS — R4189 Other symptoms and signs involving cognitive functions and awareness: Secondary | ICD-10-CM | POA: Diagnosis not present

## 2017-05-31 DIAGNOSIS — I619 Nontraumatic intracerebral hemorrhage, unspecified: Secondary | ICD-10-CM | POA: Diagnosis not present

## 2017-05-31 DIAGNOSIS — G8101 Flaccid hemiplegia affecting right dominant side: Secondary | ICD-10-CM | POA: Diagnosis not present

## 2017-05-31 DIAGNOSIS — I69291 Dysphagia following other nontraumatic intracranial hemorrhage: Secondary | ICD-10-CM | POA: Diagnosis not present

## 2017-05-31 DIAGNOSIS — R7989 Other specified abnormal findings of blood chemistry: Secondary | ICD-10-CM | POA: Diagnosis not present

## 2017-05-31 DIAGNOSIS — I700 Atherosclerosis of aorta: Secondary | ICD-10-CM | POA: Diagnosis not present

## 2017-05-31 DIAGNOSIS — R5383 Other fatigue: Secondary | ICD-10-CM | POA: Diagnosis not present

## 2017-05-31 DIAGNOSIS — I69320 Aphasia following cerebral infarction: Secondary | ICD-10-CM | POA: Diagnosis not present

## 2017-05-31 DIAGNOSIS — I119 Hypertensive heart disease without heart failure: Secondary | ICD-10-CM | POA: Diagnosis not present

## 2017-05-31 DIAGNOSIS — E1165 Type 2 diabetes mellitus with hyperglycemia: Secondary | ICD-10-CM | POA: Diagnosis not present

## 2017-05-31 DIAGNOSIS — Z0189 Encounter for other specified special examinations: Secondary | ICD-10-CM | POA: Diagnosis not present

## 2017-05-31 DIAGNOSIS — G40909 Epilepsy, unspecified, not intractable, without status epilepticus: Secondary | ICD-10-CM | POA: Diagnosis not present

## 2017-05-31 DIAGNOSIS — R509 Fever, unspecified: Secondary | ICD-10-CM | POA: Diagnosis not present

## 2017-05-31 DIAGNOSIS — G936 Cerebral edema: Secondary | ICD-10-CM | POA: Diagnosis not present

## 2017-05-31 DIAGNOSIS — E785 Hyperlipidemia, unspecified: Secondary | ICD-10-CM | POA: Diagnosis not present

## 2017-05-31 DIAGNOSIS — I69151 Hemiplegia and hemiparesis following nontraumatic intracerebral hemorrhage affecting right dominant side: Secondary | ICD-10-CM | POA: Diagnosis not present

## 2017-05-31 DIAGNOSIS — D72829 Elevated white blood cell count, unspecified: Secondary | ICD-10-CM | POA: Diagnosis not present

## 2017-05-31 DIAGNOSIS — J341 Cyst and mucocele of nose and nasal sinus: Secondary | ICD-10-CM | POA: Diagnosis not present

## 2017-05-31 DIAGNOSIS — I6523 Occlusion and stenosis of bilateral carotid arteries: Secondary | ICD-10-CM | POA: Diagnosis not present

## 2017-05-31 DIAGNOSIS — R4701 Aphasia: Secondary | ICD-10-CM | POA: Diagnosis not present

## 2017-05-31 DIAGNOSIS — I611 Nontraumatic intracerebral hemorrhage in hemisphere, cortical: Secondary | ICD-10-CM | POA: Diagnosis not present

## 2017-05-31 DIAGNOSIS — G8191 Hemiplegia, unspecified affecting right dominant side: Secondary | ICD-10-CM | POA: Diagnosis not present

## 2017-05-31 DIAGNOSIS — Z950 Presence of cardiac pacemaker: Secondary | ICD-10-CM | POA: Diagnosis not present

## 2017-05-31 DIAGNOSIS — E038 Other specified hypothyroidism: Secondary | ICD-10-CM | POA: Diagnosis not present

## 2017-05-31 DIAGNOSIS — R9389 Abnormal findings on diagnostic imaging of other specified body structures: Secondary | ICD-10-CM | POA: Diagnosis not present

## 2017-05-31 DIAGNOSIS — I672 Cerebral atherosclerosis: Secondary | ICD-10-CM | POA: Diagnosis not present

## 2017-05-31 DIAGNOSIS — Z4682 Encounter for fitting and adjustment of non-vascular catheter: Secondary | ICD-10-CM | POA: Diagnosis not present

## 2017-05-31 DIAGNOSIS — R600 Localized edema: Secondary | ICD-10-CM | POA: Diagnosis not present

## 2017-05-31 DIAGNOSIS — I69852 Hemiplegia and hemiparesis following other cerebrovascular disease affecting left dominant side: Secondary | ICD-10-CM | POA: Diagnosis not present

## 2017-05-31 DIAGNOSIS — I615 Nontraumatic intracerebral hemorrhage, intraventricular: Secondary | ICD-10-CM | POA: Diagnosis not present

## 2017-05-31 DIAGNOSIS — E872 Acidosis: Secondary | ICD-10-CM | POA: Diagnosis not present

## 2017-05-31 DIAGNOSIS — I639 Cerebral infarction, unspecified: Secondary | ICD-10-CM | POA: Diagnosis not present

## 2017-06-02 NOTE — Telephone Encounter (Signed)
[facility] [place] [place] Pt's daughter informed me that Pt had a stroke on Saturday. Pt was on the way to go on 14 day cruise and had a stroke on the train en route to get on ship.   Daughters name is [name] contact number [phone]  The number to ICU is [phone]  Called and spoke with resident who informed me of: Large left sided bleed interventricular  extension   Records of last visit will be sent to  Fax [phone] attn dr.Zimmerman  Along with Rx, Problem list, and Allergies as requested. I will be calling to check in with Dr's and Family upon completion.

## 2017-06-26 DIAGNOSIS — R4701 Aphasia: Secondary | ICD-10-CM | POA: Diagnosis not present

## 2017-06-26 DIAGNOSIS — E785 Hyperlipidemia, unspecified: Secondary | ICD-10-CM | POA: Diagnosis not present

## 2017-06-26 DIAGNOSIS — I69398 Other sequelae of cerebral infarction: Secondary | ICD-10-CM | POA: Diagnosis not present

## 2017-06-26 DIAGNOSIS — Z823 Family history of stroke: Secondary | ICD-10-CM | POA: Diagnosis not present

## 2017-06-26 DIAGNOSIS — R4189 Other symptoms and signs involving cognitive functions and awareness: Secondary | ICD-10-CM | POA: Diagnosis not present

## 2017-06-26 DIAGNOSIS — I69291 Dysphagia following other nontraumatic intracranial hemorrhage: Secondary | ICD-10-CM | POA: Diagnosis not present

## 2017-06-26 DIAGNOSIS — M7989 Other specified soft tissue disorders: Secondary | ICD-10-CM | POA: Diagnosis not present

## 2017-06-26 DIAGNOSIS — I9589 Other hypotension: Secondary | ICD-10-CM | POA: Diagnosis not present

## 2017-06-26 DIAGNOSIS — I69151 Hemiplegia and hemiparesis following nontraumatic intracerebral hemorrhage affecting right dominant side: Secondary | ICD-10-CM | POA: Diagnosis not present

## 2017-06-26 DIAGNOSIS — I69992 Facial weakness following unspecified cerebrovascular disease: Secondary | ICD-10-CM | POA: Diagnosis not present

## 2017-06-26 DIAGNOSIS — F0631 Mood disorder due to known physiological condition with depressive features: Secondary | ICD-10-CM | POA: Diagnosis not present

## 2017-06-26 DIAGNOSIS — I69320 Aphasia following cerebral infarction: Secondary | ICD-10-CM | POA: Diagnosis not present

## 2017-06-26 DIAGNOSIS — G6289 Other specified polyneuropathies: Secondary | ICD-10-CM | POA: Diagnosis not present

## 2017-06-26 DIAGNOSIS — R05 Cough: Secondary | ICD-10-CM | POA: Diagnosis not present

## 2017-06-26 DIAGNOSIS — R569 Unspecified convulsions: Secondary | ICD-10-CM | POA: Diagnosis not present

## 2017-06-26 DIAGNOSIS — G8191 Hemiplegia, unspecified affecting right dominant side: Secondary | ICD-10-CM | POA: Diagnosis not present

## 2017-06-26 DIAGNOSIS — G9389 Other specified disorders of brain: Secondary | ICD-10-CM | POA: Diagnosis not present

## 2017-06-26 DIAGNOSIS — D6489 Other specified anemias: Secondary | ICD-10-CM | POA: Diagnosis not present

## 2017-06-26 DIAGNOSIS — I69351 Hemiplegia and hemiparesis following cerebral infarction affecting right dominant side: Secondary | ICD-10-CM | POA: Diagnosis not present

## 2017-06-26 DIAGNOSIS — I6789 Other cerebrovascular disease: Secondary | ICD-10-CM | POA: Diagnosis not present

## 2017-06-26 DIAGNOSIS — Z20828 Contact with and (suspected) exposure to other viral communicable diseases: Secondary | ICD-10-CM | POA: Diagnosis not present

## 2017-06-26 DIAGNOSIS — R4781 Slurred speech: Secondary | ICD-10-CM | POA: Diagnosis not present

## 2017-06-26 DIAGNOSIS — K219 Gastro-esophageal reflux disease without esophagitis: Secondary | ICD-10-CM | POA: Diagnosis not present

## 2017-06-26 DIAGNOSIS — Z950 Presence of cardiac pacemaker: Secondary | ICD-10-CM | POA: Diagnosis not present

## 2017-06-26 DIAGNOSIS — R1312 Dysphagia, oropharyngeal phase: Secondary | ICD-10-CM | POA: Diagnosis not present

## 2017-06-26 DIAGNOSIS — R479 Unspecified speech disturbances: Secondary | ICD-10-CM | POA: Diagnosis not present

## 2017-06-26 DIAGNOSIS — R5081 Fever presenting with conditions classified elsewhere: Secondary | ICD-10-CM | POA: Diagnosis not present

## 2017-06-26 DIAGNOSIS — I619 Nontraumatic intracerebral hemorrhage, unspecified: Secondary | ICD-10-CM | POA: Diagnosis not present

## 2017-06-26 DIAGNOSIS — E038 Other specified hypothyroidism: Secondary | ICD-10-CM | POA: Diagnosis not present

## 2017-06-26 DIAGNOSIS — I69322 Dysarthria following cerebral infarction: Secondary | ICD-10-CM | POA: Diagnosis not present

## 2017-06-26 DIAGNOSIS — I10 Essential (primary) hypertension: Secondary | ICD-10-CM | POA: Diagnosis not present

## 2017-06-26 DIAGNOSIS — I639 Cerebral infarction, unspecified: Secondary | ICD-10-CM | POA: Diagnosis not present

## 2017-06-26 DIAGNOSIS — I618 Other nontraumatic intracerebral hemorrhage: Secondary | ICD-10-CM | POA: Diagnosis not present

## 2017-06-26 DIAGNOSIS — E039 Hypothyroidism, unspecified: Secondary | ICD-10-CM | POA: Diagnosis not present

## 2017-06-26 DIAGNOSIS — G4089 Other seizures: Secondary | ICD-10-CM | POA: Diagnosis not present

## 2017-06-26 DIAGNOSIS — I517 Cardiomegaly: Secondary | ICD-10-CM | POA: Diagnosis not present

## 2017-06-26 DIAGNOSIS — K5909 Other constipation: Secondary | ICD-10-CM | POA: Diagnosis not present

## 2017-06-26 DIAGNOSIS — M1388 Other specified arthritis, other site: Secondary | ICD-10-CM | POA: Diagnosis not present

## 2017-06-26 DIAGNOSIS — G40909 Epilepsy, unspecified, not intractable, without status epilepticus: Secondary | ICD-10-CM | POA: Diagnosis not present

## 2017-06-26 DIAGNOSIS — R1319 Other dysphagia: Secondary | ICD-10-CM | POA: Diagnosis not present

## 2017-06-27 DIAGNOSIS — I618 Other nontraumatic intracerebral hemorrhage: Secondary | ICD-10-CM | POA: Diagnosis not present

## 2017-06-27 DIAGNOSIS — R1319 Other dysphagia: Secondary | ICD-10-CM | POA: Diagnosis not present

## 2017-06-27 DIAGNOSIS — I10 Essential (primary) hypertension: Secondary | ICD-10-CM | POA: Diagnosis not present

## 2017-06-27 DIAGNOSIS — G4089 Other seizures: Secondary | ICD-10-CM | POA: Diagnosis not present

## 2017-06-30 DIAGNOSIS — M7989 Other specified soft tissue disorders: Secondary | ICD-10-CM | POA: Diagnosis not present

## 2017-06-30 DIAGNOSIS — I9589 Other hypotension: Secondary | ICD-10-CM | POA: Diagnosis not present

## 2017-07-03 DIAGNOSIS — G6289 Other specified polyneuropathies: Secondary | ICD-10-CM | POA: Diagnosis not present

## 2017-07-07 DIAGNOSIS — G6289 Other specified polyneuropathies: Secondary | ICD-10-CM | POA: Diagnosis not present

## 2017-07-15 DIAGNOSIS — R5081 Fever presenting with conditions classified elsewhere: Secondary | ICD-10-CM | POA: Diagnosis not present

## 2017-07-15 DIAGNOSIS — Z20828 Contact with and (suspected) exposure to other viral communicable diseases: Secondary | ICD-10-CM | POA: Diagnosis not present

## 2017-07-15 DIAGNOSIS — G4089 Other seizures: Secondary | ICD-10-CM | POA: Diagnosis not present

## 2017-07-16 DIAGNOSIS — G6289 Other specified polyneuropathies: Secondary | ICD-10-CM | POA: Diagnosis not present

## 2017-07-17 DIAGNOSIS — R05 Cough: Secondary | ICD-10-CM | POA: Diagnosis not present

## 2017-07-18 DIAGNOSIS — Z20828 Contact with and (suspected) exposure to other viral communicable diseases: Secondary | ICD-10-CM | POA: Diagnosis not present

## 2017-07-18 DIAGNOSIS — K5909 Other constipation: Secondary | ICD-10-CM | POA: Diagnosis not present

## 2017-07-23 DIAGNOSIS — G4089 Other seizures: Secondary | ICD-10-CM | POA: Diagnosis not present

## 2017-07-23 DIAGNOSIS — F0631 Mood disorder due to known physiological condition with depressive features: Secondary | ICD-10-CM | POA: Diagnosis not present

## 2017-07-23 DIAGNOSIS — G6289 Other specified polyneuropathies: Secondary | ICD-10-CM | POA: Diagnosis not present

## 2017-07-23 DIAGNOSIS — I10 Essential (primary) hypertension: Secondary | ICD-10-CM | POA: Diagnosis not present

## 2017-07-31 DIAGNOSIS — M1388 Other specified arthritis, other site: Secondary | ICD-10-CM | POA: Diagnosis not present

## 2017-07-31 DIAGNOSIS — I10 Essential (primary) hypertension: Secondary | ICD-10-CM | POA: Diagnosis not present

## 2017-07-31 DIAGNOSIS — G6289 Other specified polyneuropathies: Secondary | ICD-10-CM | POA: Diagnosis not present

## 2017-07-31 DIAGNOSIS — D6489 Other specified anemias: Secondary | ICD-10-CM | POA: Diagnosis not present

## 2017-08-06 DIAGNOSIS — I618 Other nontraumatic intracerebral hemorrhage: Secondary | ICD-10-CM | POA: Diagnosis not present

## 2017-08-06 DIAGNOSIS — D6489 Other specified anemias: Secondary | ICD-10-CM | POA: Diagnosis not present

## 2017-08-06 DIAGNOSIS — M1388 Other specified arthritis, other site: Secondary | ICD-10-CM | POA: Diagnosis not present

## 2017-08-10 DIAGNOSIS — R4781 Slurred speech: Secondary | ICD-10-CM | POA: Diagnosis not present

## 2017-08-10 DIAGNOSIS — Z823 Family history of stroke: Secondary | ICD-10-CM | POA: Diagnosis not present

## 2017-08-10 DIAGNOSIS — G9389 Other specified disorders of brain: Secondary | ICD-10-CM | POA: Diagnosis not present

## 2017-08-10 DIAGNOSIS — R531 Weakness: Secondary | ICD-10-CM | POA: Diagnosis not present

## 2017-08-10 DIAGNOSIS — R4701 Aphasia: Secondary | ICD-10-CM | POA: Diagnosis not present

## 2017-08-10 DIAGNOSIS — R29818 Other symptoms and signs involving the nervous system: Secondary | ICD-10-CM | POA: Diagnosis not present

## 2017-08-10 DIAGNOSIS — Z8673 Personal history of transient ischemic attack (TIA), and cerebral infarction without residual deficits: Secondary | ICD-10-CM | POA: Diagnosis not present

## 2017-08-10 DIAGNOSIS — I517 Cardiomegaly: Secondary | ICD-10-CM | POA: Diagnosis not present

## 2017-08-10 DIAGNOSIS — I69992 Facial weakness following unspecified cerebrovascular disease: Secondary | ICD-10-CM | POA: Diagnosis not present

## 2017-08-10 DIAGNOSIS — R001 Bradycardia, unspecified: Secondary | ICD-10-CM | POA: Diagnosis not present

## 2017-08-10 DIAGNOSIS — I10 Essential (primary) hypertension: Secondary | ICD-10-CM | POA: Diagnosis not present

## 2017-08-10 DIAGNOSIS — I639 Cerebral infarction, unspecified: Secondary | ICD-10-CM | POA: Diagnosis not present

## 2017-08-10 DIAGNOSIS — M4800 Spinal stenosis, site unspecified: Secondary | ICD-10-CM | POA: Diagnosis not present

## 2017-08-10 DIAGNOSIS — I69351 Hemiplegia and hemiparesis following cerebral infarction affecting right dominant side: Secondary | ICD-10-CM | POA: Diagnosis not present

## 2017-08-10 DIAGNOSIS — I69320 Aphasia following cerebral infarction: Secondary | ICD-10-CM | POA: Diagnosis not present

## 2017-08-10 DIAGNOSIS — R479 Unspecified speech disturbances: Secondary | ICD-10-CM | POA: Diagnosis not present

## 2017-08-10 DIAGNOSIS — R279 Unspecified lack of coordination: Secondary | ICD-10-CM | POA: Diagnosis not present

## 2017-08-10 DIAGNOSIS — R569 Unspecified convulsions: Secondary | ICD-10-CM | POA: Diagnosis not present

## 2017-08-10 DIAGNOSIS — E039 Hypothyroidism, unspecified: Secondary | ICD-10-CM | POA: Diagnosis not present

## 2017-08-10 DIAGNOSIS — K219 Gastro-esophageal reflux disease without esophagitis: Secondary | ICD-10-CM | POA: Diagnosis not present

## 2017-08-10 DIAGNOSIS — I6789 Other cerebrovascular disease: Secondary | ICD-10-CM | POA: Diagnosis not present

## 2017-08-10 DIAGNOSIS — Z743 Need for continuous supervision: Secondary | ICD-10-CM | POA: Diagnosis not present

## 2017-08-10 DIAGNOSIS — R9401 Abnormal electroencephalogram [EEG]: Secondary | ICD-10-CM | POA: Diagnosis not present

## 2017-08-10 DIAGNOSIS — I69398 Other sequelae of cerebral infarction: Secondary | ICD-10-CM | POA: Diagnosis not present

## 2017-08-13 ENCOUNTER — Other Ambulatory Visit: Payer: Self-pay

## 2017-08-13 NOTE — Patient Outreach (Signed)
[facility]) Care Management  08/13/2017  Kathleen Jensen [birth year] [id number]  Transition of care  Referral date: 107-01-52 Referral source: [facility] 08[facility]17 Insurance: [employer] Attempt #1  Telephone call to patient regarding transition of care follow up. Unable to reach patient.  Message states, "memory is full.''  PLAN: RNCM will attempt 2nd telephone call to patient within 3 business days .  George InaDavina Ineze Serrao RN,BSN,CCM [facility] Telephonic  [phone]

## 2017-08-15 ENCOUNTER — Other Ambulatory Visit: Payer: Self-pay | Admitting: *Deleted

## 2017-08-15 MED ORDER — AMLODIPINE BESYLATE 5 MG PO TABS
10.00 | ORAL_TABLET | ORAL | Status: DC
Start: 2017-08-16 — End: 2017-08-15

## 2017-08-15 MED ORDER — ENOXAPARIN SODIUM 40 MG/0.4ML ~~LOC~~ SOLN
40.00 | SUBCUTANEOUS | Status: DC
Start: 2017-08-16 — End: 2017-08-15

## 2017-08-15 MED ORDER — LABETALOL HCL 5 MG/ML IV SOLN
10.00 | INTRAVENOUS | Status: DC
Start: ? — End: 2017-08-15

## 2017-08-15 MED ORDER — LEVETIRACETAM 500 MG PO TABS
2500.00 | ORAL_TABLET | ORAL | Status: DC
Start: 2017-08-15 — End: 2017-08-15

## 2017-08-15 MED ORDER — ALUMINUM-MAGNESIUM-SIMETHICONE 200-200-20 MG/5ML PO SUSP
30.00 | ORAL | Status: DC
Start: ? — End: 2017-08-15

## 2017-08-15 MED ORDER — SPIRONOLACTONE 25 MG PO TABS
12.50 | ORAL_TABLET | ORAL | Status: DC
Start: 2017-08-16 — End: 2017-08-15

## 2017-08-15 MED ORDER — GABAPENTIN 100 MG PO CAPS
200.00 | ORAL_CAPSULE | ORAL | Status: DC
Start: 2017-08-15 — End: 2017-08-15

## 2017-08-15 MED ORDER — LEVOTHYROXINE SODIUM 100 MCG PO TABS
100.00 | ORAL_TABLET | ORAL | Status: DC
Start: 2017-08-16 — End: 2017-08-15

## 2017-08-15 MED ORDER — PANTOPRAZOLE SODIUM 40 MG PO TBEC
40.00 | DELAYED_RELEASE_TABLET | ORAL | Status: DC
Start: 2017-08-16 — End: 2017-08-15

## 2017-08-15 MED ORDER — THIAMINE HCL 100 MG PO TABS
100.00 | ORAL_TABLET | ORAL | Status: DC
Start: 2017-08-16 — End: 2017-08-15

## 2017-08-15 NOTE — Patient Outreach (Signed)
[facility]) Care Management  08/15/2017  Kathleen ChickRovena Jensen 17-Feb-1951 [id number]  Telephone Outreach to patient. HIPAA verified with patient's daughter. Per Prince Solianharrissa, patient [facility] diagnosed with Seizure Activity.    Plan: RN CM will notify [facility] Case [facility] Assistant regarding case closure.  RN CM advised patient to contact RN CM for any needs or concerns.  Kathleen ClevelandJuanita Ria Redcay, RN, BSN, MHA/MSL, [facility] Telephonic Care Manager Coordinator [facility] Direct Phone: [phone] Cell Phone: [phone] Toll Free: [phone] Fax: [phone]

## 2017-08-25 ENCOUNTER — Other Ambulatory Visit: Payer: Self-pay | Admitting: *Deleted

## 2017-08-25 NOTE — Patient Outreach (Addendum)
[facility]) Care Management  08/25/2017  Ethelda ChickRovena Laspina 02/07/1951 [id number]   Incoming call from patient's daughter. HIPAA identifiers verified. [name] reported [facility] She asked about [facility] services and RN CM explained [facility]. [name] expressed concerned about patient being denied continued rehab services by [facility] after she is discharged from the hospital. [name] stated, patient has improved from therapy, however she has room for more improvement. Per Minette Headlandharissma, patient wants to get better and believes rehab would assist her get back to her baseline. RN CM encouraged patient's daughter to contact her regarding determination from [facility]. Charissma stated, [facility] plans to reassess member's condition today. Charissma stated, patient is currently working with inpatient therapy services within the hospital.  Plan: RN CM advised patient to contact RNCM for any needs or concerns.  Wynelle ClevelandJuanita Isola Mehlman, RN, BSN, MHA/MSL, [facility] Telephonic Care Manager Coordinator [facility] Direct Phone: [phone] Cell Phone: [phone] Toll Free: [phone] Fax: [phone]

## 2017-09-07 DIAGNOSIS — M5136 Other intervertebral disc degeneration, lumbar region: Secondary | ICD-10-CM | POA: Diagnosis not present

## 2017-09-07 DIAGNOSIS — I69398 Other sequelae of cerebral infarction: Secondary | ICD-10-CM | POA: Diagnosis not present

## 2017-09-07 DIAGNOSIS — M4726 Other spondylosis with radiculopathy, lumbar region: Secondary | ICD-10-CM | POA: Diagnosis not present

## 2017-09-07 DIAGNOSIS — E1122 Type 2 diabetes mellitus with diabetic chronic kidney disease: Secondary | ICD-10-CM | POA: Diagnosis not present

## 2017-09-07 DIAGNOSIS — R569 Unspecified convulsions: Secondary | ICD-10-CM | POA: Diagnosis not present

## 2017-09-07 DIAGNOSIS — I69351 Hemiplegia and hemiparesis following cerebral infarction affecting right dominant side: Secondary | ICD-10-CM | POA: Diagnosis not present

## 2017-09-07 DIAGNOSIS — I69320 Aphasia following cerebral infarction: Secondary | ICD-10-CM | POA: Diagnosis not present

## 2017-09-07 DIAGNOSIS — N183 Chronic kidney disease, stage 3 (moderate): Secondary | ICD-10-CM | POA: Diagnosis not present

## 2017-09-07 DIAGNOSIS — I129 Hypertensive chronic kidney disease with stage 1 through stage 4 chronic kidney disease, or unspecified chronic kidney disease: Secondary | ICD-10-CM | POA: Diagnosis not present

## 2017-09-08 ENCOUNTER — Telehealth: Payer: Self-pay | Admitting: Family Medicine

## 2017-09-08 DIAGNOSIS — I69398 Other sequelae of cerebral infarction: Secondary | ICD-10-CM | POA: Diagnosis not present

## 2017-09-08 DIAGNOSIS — R569 Unspecified convulsions: Secondary | ICD-10-CM | POA: Diagnosis not present

## 2017-09-08 DIAGNOSIS — I69320 Aphasia following cerebral infarction: Secondary | ICD-10-CM | POA: Diagnosis not present

## 2017-09-08 DIAGNOSIS — I69351 Hemiplegia and hemiparesis following cerebral infarction affecting right dominant side: Secondary | ICD-10-CM | POA: Diagnosis not present

## 2017-09-08 DIAGNOSIS — M4726 Other spondylosis with radiculopathy, lumbar region: Secondary | ICD-10-CM | POA: Diagnosis not present

## 2017-09-08 DIAGNOSIS — E1122 Type 2 diabetes mellitus with diabetic chronic kidney disease: Secondary | ICD-10-CM | POA: Diagnosis not present

## 2017-09-08 DIAGNOSIS — N183 Chronic kidney disease, stage 3 (moderate): Secondary | ICD-10-CM | POA: Diagnosis not present

## 2017-09-08 DIAGNOSIS — I129 Hypertensive chronic kidney disease with stage 1 through stage 4 chronic kidney disease, or unspecified chronic kidney disease: Secondary | ICD-10-CM | POA: Diagnosis not present

## 2017-09-08 DIAGNOSIS — M5136 Other intervertebral disc degeneration, lumbar region: Secondary | ICD-10-CM | POA: Diagnosis not present

## 2017-09-08 NOTE — Telephone Encounter (Signed)
Copied from CRM [id number]. Topic: Inquiry >> Sep 08, 2017 11:50 AM Crist InfanteHarrald, Kathy J wrote: Reason for CRM: [facility] verbal for skilled nursing 2 wk /1 1 wk /3   3 prn visits Also requesting evaluation for PT, OT, ST and social worker  Home health aid  2 wk / 2

## 2017-09-09 ENCOUNTER — Other Ambulatory Visit: Payer: Self-pay

## 2017-09-09 DIAGNOSIS — M4726 Other spondylosis with radiculopathy, lumbar region: Secondary | ICD-10-CM | POA: Diagnosis not present

## 2017-09-09 DIAGNOSIS — E1122 Type 2 diabetes mellitus with diabetic chronic kidney disease: Secondary | ICD-10-CM | POA: Diagnosis not present

## 2017-09-09 DIAGNOSIS — N183 Chronic kidney disease, stage 3 (moderate): Secondary | ICD-10-CM | POA: Diagnosis not present

## 2017-09-09 DIAGNOSIS — R569 Unspecified convulsions: Secondary | ICD-10-CM | POA: Diagnosis not present

## 2017-09-09 DIAGNOSIS — M5136 Other intervertebral disc degeneration, lumbar region: Secondary | ICD-10-CM | POA: Diagnosis not present

## 2017-09-09 DIAGNOSIS — I69320 Aphasia following cerebral infarction: Secondary | ICD-10-CM | POA: Diagnosis not present

## 2017-09-09 DIAGNOSIS — I69398 Other sequelae of cerebral infarction: Secondary | ICD-10-CM | POA: Diagnosis not present

## 2017-09-09 DIAGNOSIS — I69351 Hemiplegia and hemiparesis following cerebral infarction affecting right dominant side: Secondary | ICD-10-CM | POA: Diagnosis not present

## 2017-09-09 DIAGNOSIS — I129 Hypertensive chronic kidney disease with stage 1 through stage 4 chronic kidney disease, or unspecified chronic kidney disease: Secondary | ICD-10-CM | POA: Diagnosis not present

## 2017-09-09 NOTE — Patient Outreach (Signed)
[facility]) Care Management  09/09/2017  Kathleen ChickRovena Jensen 06/26/1951 [id number]   Transition of care  Referral date: 09/09/17 Referral source: [facility] Insurance: [employer] advantage  Transition of care will be completed by primary care provider office who will refer to [facility] care [facility] if needed.  PLAN: RNCM will refer patient to care management assistant to close due to patient being enrolled in an external program.   George InaDavina Maxximus Gotay RN,BSN,CCM [facility] Telephonic  [phone]

## 2017-09-10 ENCOUNTER — Telehealth: Payer: Self-pay | Admitting: Family Medicine

## 2017-09-10 DIAGNOSIS — I69398 Other sequelae of cerebral infarction: Secondary | ICD-10-CM | POA: Diagnosis not present

## 2017-09-10 DIAGNOSIS — E1122 Type 2 diabetes mellitus with diabetic chronic kidney disease: Secondary | ICD-10-CM | POA: Diagnosis not present

## 2017-09-10 DIAGNOSIS — M4726 Other spondylosis with radiculopathy, lumbar region: Secondary | ICD-10-CM | POA: Diagnosis not present

## 2017-09-10 DIAGNOSIS — N183 Chronic kidney disease, stage 3 (moderate): Secondary | ICD-10-CM | POA: Diagnosis not present

## 2017-09-10 DIAGNOSIS — I129 Hypertensive chronic kidney disease with stage 1 through stage 4 chronic kidney disease, or unspecified chronic kidney disease: Secondary | ICD-10-CM | POA: Diagnosis not present

## 2017-09-10 DIAGNOSIS — R569 Unspecified convulsions: Secondary | ICD-10-CM | POA: Diagnosis not present

## 2017-09-10 DIAGNOSIS — I69320 Aphasia following cerebral infarction: Secondary | ICD-10-CM | POA: Diagnosis not present

## 2017-09-10 DIAGNOSIS — M5136 Other intervertebral disc degeneration, lumbar region: Secondary | ICD-10-CM | POA: Diagnosis not present

## 2017-09-10 DIAGNOSIS — I69351 Hemiplegia and hemiparesis following cerebral infarction affecting right dominant side: Secondary | ICD-10-CM | POA: Diagnosis not present

## 2017-09-10 NOTE — Telephone Encounter (Unsigned)
Copied from CRM [phone]. Topic: Quick Communication - See Telephone Encounter >> Sep 10, 2017  2:06 PM Waymon AmatoBurton, Donna F wrote: CRM for notification. 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

## 2017-09-10 NOTE — Telephone Encounter (Signed)
Gave verbal to Avayaicolet

## 2017-09-11 ENCOUNTER — Telehealth: Payer: Self-pay | Admitting: Family Medicine

## 2017-09-11 NOTE — Telephone Encounter (Signed)
Copied from CRM [id number]. Topic: General - Other >> Sep 11, 2017 10:39 AM Gerrianne ScalePayne, Leray Garverick L wrote: Reason for CRM: [facility] [phone] calling to get verbal orders for PT two times a week for four weeks

## 2017-09-11 NOTE — Telephone Encounter (Signed)
Verbal orders given to Nicolette 

## 2017-09-11 NOTE — Telephone Encounter (Signed)
Verbal orders given  

## 2017-09-12 DIAGNOSIS — R569 Unspecified convulsions: Secondary | ICD-10-CM | POA: Diagnosis not present

## 2017-09-12 DIAGNOSIS — E1122 Type 2 diabetes mellitus with diabetic chronic kidney disease: Secondary | ICD-10-CM | POA: Diagnosis not present

## 2017-09-12 DIAGNOSIS — N183 Chronic kidney disease, stage 3 (moderate): Secondary | ICD-10-CM | POA: Diagnosis not present

## 2017-09-12 DIAGNOSIS — I69398 Other sequelae of cerebral infarction: Secondary | ICD-10-CM | POA: Diagnosis not present

## 2017-09-12 DIAGNOSIS — I129 Hypertensive chronic kidney disease with stage 1 through stage 4 chronic kidney disease, or unspecified chronic kidney disease: Secondary | ICD-10-CM | POA: Diagnosis not present

## 2017-09-12 DIAGNOSIS — M4726 Other spondylosis with radiculopathy, lumbar region: Secondary | ICD-10-CM | POA: Diagnosis not present

## 2017-09-12 DIAGNOSIS — I69351 Hemiplegia and hemiparesis following cerebral infarction affecting right dominant side: Secondary | ICD-10-CM | POA: Diagnosis not present

## 2017-09-12 DIAGNOSIS — I69320 Aphasia following cerebral infarction: Secondary | ICD-10-CM | POA: Diagnosis not present

## 2017-09-12 DIAGNOSIS — M5136 Other intervertebral disc degeneration, lumbar region: Secondary | ICD-10-CM | POA: Diagnosis not present

## 2017-09-15 DIAGNOSIS — M4726 Other spondylosis with radiculopathy, lumbar region: Secondary | ICD-10-CM | POA: Diagnosis not present

## 2017-09-15 DIAGNOSIS — N183 Chronic kidney disease, stage 3 (moderate): Secondary | ICD-10-CM | POA: Diagnosis not present

## 2017-09-15 DIAGNOSIS — I69351 Hemiplegia and hemiparesis following cerebral infarction affecting right dominant side: Secondary | ICD-10-CM | POA: Diagnosis not present

## 2017-09-15 DIAGNOSIS — R569 Unspecified convulsions: Secondary | ICD-10-CM | POA: Diagnosis not present

## 2017-09-15 DIAGNOSIS — M5136 Other intervertebral disc degeneration, lumbar region: Secondary | ICD-10-CM | POA: Diagnosis not present

## 2017-09-15 DIAGNOSIS — I69320 Aphasia following cerebral infarction: Secondary | ICD-10-CM | POA: Diagnosis not present

## 2017-09-15 DIAGNOSIS — E1122 Type 2 diabetes mellitus with diabetic chronic kidney disease: Secondary | ICD-10-CM | POA: Diagnosis not present

## 2017-09-15 DIAGNOSIS — I69398 Other sequelae of cerebral infarction: Secondary | ICD-10-CM | POA: Diagnosis not present

## 2017-09-15 DIAGNOSIS — I129 Hypertensive chronic kidney disease with stage 1 through stage 4 chronic kidney disease, or unspecified chronic kidney disease: Secondary | ICD-10-CM | POA: Diagnosis not present

## 2017-09-17 DIAGNOSIS — I129 Hypertensive chronic kidney disease with stage 1 through stage 4 chronic kidney disease, or unspecified chronic kidney disease: Secondary | ICD-10-CM | POA: Diagnosis not present

## 2017-09-17 DIAGNOSIS — I69320 Aphasia following cerebral infarction: Secondary | ICD-10-CM | POA: Diagnosis not present

## 2017-09-17 DIAGNOSIS — N183 Chronic kidney disease, stage 3 (moderate): Secondary | ICD-10-CM | POA: Diagnosis not present

## 2017-09-17 DIAGNOSIS — E1122 Type 2 diabetes mellitus with diabetic chronic kidney disease: Secondary | ICD-10-CM | POA: Diagnosis not present

## 2017-09-17 DIAGNOSIS — I69398 Other sequelae of cerebral infarction: Secondary | ICD-10-CM | POA: Diagnosis not present

## 2017-09-17 DIAGNOSIS — M5136 Other intervertebral disc degeneration, lumbar region: Secondary | ICD-10-CM | POA: Diagnosis not present

## 2017-09-17 DIAGNOSIS — R569 Unspecified convulsions: Secondary | ICD-10-CM | POA: Diagnosis not present

## 2017-09-17 DIAGNOSIS — M4726 Other spondylosis with radiculopathy, lumbar region: Secondary | ICD-10-CM | POA: Diagnosis not present

## 2017-09-17 DIAGNOSIS — I69351 Hemiplegia and hemiparesis following cerebral infarction affecting right dominant side: Secondary | ICD-10-CM | POA: Diagnosis not present

## 2017-09-18 DIAGNOSIS — I69351 Hemiplegia and hemiparesis following cerebral infarction affecting right dominant side: Secondary | ICD-10-CM | POA: Diagnosis not present

## 2017-09-18 DIAGNOSIS — I69398 Other sequelae of cerebral infarction: Secondary | ICD-10-CM | POA: Diagnosis not present

## 2017-09-18 DIAGNOSIS — E1122 Type 2 diabetes mellitus with diabetic chronic kidney disease: Secondary | ICD-10-CM | POA: Diagnosis not present

## 2017-09-18 DIAGNOSIS — M4726 Other spondylosis with radiculopathy, lumbar region: Secondary | ICD-10-CM | POA: Diagnosis not present

## 2017-09-18 DIAGNOSIS — I129 Hypertensive chronic kidney disease with stage 1 through stage 4 chronic kidney disease, or unspecified chronic kidney disease: Secondary | ICD-10-CM | POA: Diagnosis not present

## 2017-09-18 DIAGNOSIS — I69320 Aphasia following cerebral infarction: Secondary | ICD-10-CM | POA: Diagnosis not present

## 2017-09-18 DIAGNOSIS — M5136 Other intervertebral disc degeneration, lumbar region: Secondary | ICD-10-CM | POA: Diagnosis not present

## 2017-09-18 DIAGNOSIS — N183 Chronic kidney disease, stage 3 (moderate): Secondary | ICD-10-CM | POA: Diagnosis not present

## 2017-09-18 DIAGNOSIS — R569 Unspecified convulsions: Secondary | ICD-10-CM | POA: Diagnosis not present

## 2017-09-19 ENCOUNTER — Telehealth: Payer: Self-pay | Admitting: *Deleted

## 2017-09-19 DIAGNOSIS — R569 Unspecified convulsions: Secondary | ICD-10-CM | POA: Diagnosis not present

## 2017-09-19 DIAGNOSIS — I69351 Hemiplegia and hemiparesis following cerebral infarction affecting right dominant side: Secondary | ICD-10-CM | POA: Diagnosis not present

## 2017-09-19 DIAGNOSIS — I129 Hypertensive chronic kidney disease with stage 1 through stage 4 chronic kidney disease, or unspecified chronic kidney disease: Secondary | ICD-10-CM | POA: Diagnosis not present

## 2017-09-19 DIAGNOSIS — M4726 Other spondylosis with radiculopathy, lumbar region: Secondary | ICD-10-CM | POA: Diagnosis not present

## 2017-09-19 DIAGNOSIS — M5136 Other intervertebral disc degeneration, lumbar region: Secondary | ICD-10-CM | POA: Diagnosis not present

## 2017-09-19 DIAGNOSIS — I69320 Aphasia following cerebral infarction: Secondary | ICD-10-CM | POA: Diagnosis not present

## 2017-09-19 DIAGNOSIS — N183 Chronic kidney disease, stage 3 (moderate): Secondary | ICD-10-CM | POA: Diagnosis not present

## 2017-09-19 DIAGNOSIS — E1122 Type 2 diabetes mellitus with diabetic chronic kidney disease: Secondary | ICD-10-CM | POA: Diagnosis not present

## 2017-09-19 DIAGNOSIS — I69398 Other sequelae of cerebral infarction: Secondary | ICD-10-CM | POA: Diagnosis not present

## 2017-09-19 NOTE — Telephone Encounter (Signed)
Received Home Health Certification and Plan of Care; forwarded to provider/SLS 02/22

## 2017-09-22 ENCOUNTER — Encounter: Payer: Self-pay | Admitting: Family Medicine

## 2017-09-22 ENCOUNTER — Ambulatory Visit (INDEPENDENT_AMBULATORY_CARE_PROVIDER_SITE_OTHER): Payer: Medicare HMO | Admitting: Family Medicine

## 2017-09-22 ENCOUNTER — Telehealth: Payer: Self-pay | Admitting: *Deleted

## 2017-09-22 VITALS — BP 127/91 | HR 58 | Temp 97.8°F | Resp 16

## 2017-09-22 DIAGNOSIS — R350 Frequency of micturition: Secondary | ICD-10-CM

## 2017-09-22 DIAGNOSIS — I10 Essential (primary) hypertension: Secondary | ICD-10-CM

## 2017-09-22 DIAGNOSIS — I159 Secondary hypertension, unspecified: Secondary | ICD-10-CM | POA: Diagnosis not present

## 2017-09-22 DIAGNOSIS — E785 Hyperlipidemia, unspecified: Secondary | ICD-10-CM

## 2017-09-22 DIAGNOSIS — E039 Hypothyroidism, unspecified: Secondary | ICD-10-CM

## 2017-09-22 DIAGNOSIS — R6889 Other general symptoms and signs: Secondary | ICD-10-CM | POA: Diagnosis not present

## 2017-09-22 DIAGNOSIS — E0590 Thyrotoxicosis, unspecified without thyrotoxic crisis or storm: Secondary | ICD-10-CM | POA: Diagnosis not present

## 2017-09-22 DIAGNOSIS — N183 Chronic kidney disease, stage 3 unspecified: Secondary | ICD-10-CM

## 2017-09-22 LAB — LIPID PANEL
CHOL/HDL RATIO: 3
CHOLESTEROL: 140 mg/dL (ref 0–200)
HDL: 49.4 mg/dL (ref >39.00)
LDL Cholesterol: 75 mg/dL (ref 0–99)
NonHDL: 90.69
TRIGLYCERIDES: 76 mg/dL (ref 0.0–149.0)
VLDL: 15.2 mg/dL (ref 0.0–40.0)

## 2017-09-22 LAB — COMPREHENSIVE METABOLIC PANEL
ALBUMIN: 3.5 g/dL (ref 3.5–5.2)
ALT: 18 U/L (ref 0–35)
AST: 25 U/L (ref 0–37)
Alkaline Phosphatase: 59 U/L (ref 39–117)
BILIRUBIN TOTAL: 0.4 mg/dL (ref 0.2–1.2)
BUN: 14 mg/dL (ref 6–23)
CALCIUM: 9.8 mg/dL (ref 8.4–10.5)
CO2: 27 meq/L (ref 19–32)
CREATININE: 0.72 mg/dL (ref 0.40–1.20)
Chloride: 108 mEq/L (ref 96–112)
GFR: 103.87 mL/min (ref >60.00)
Glucose, Bld: 76 mg/dL (ref 70–99)
Potassium: 3.4 mEq/L — ABNORMAL LOW (ref 3.5–5.1)
Sodium: 142 mEq/L (ref 135–145)
Total Protein: 7 g/dL (ref 6.0–8.3)

## 2017-09-22 LAB — TSH: TSH: 0.08 u[IU]/mL — AB (ref 0.35–4.50)

## 2017-09-22 NOTE — Progress Notes (Signed)
[name], female   DOB: October 13, 1950, 67 y.o.   MRN: [id number]    Subjective:  I acted as a Neurosurgeon for Dr. Zola Button.  Apolonio Schneiders, CMA   [name], female    DOB: 09-22-1950, 67 y.o.   MRN: [id number]  Chief Complaint  Patient presents with  . Hospitalization Follow-up    [facility] 08/10/17-09/05/17    HPI Patient is in today for hospital follow up.  She had an ICH back in OCT while on a train in [place] headed to a cruise ship.  -- she was hospitalized and [facility] [place]2017 .   She then was D/C to rehab facility here in [place] and presented to [facility] on 08/10/2017 with aphasia.  She was originally a code stroke in the ED but was not a tPA candidate due to recent ICH.  Imaging was neg for a new stroke.  The pt was admitted to gen neurology for further work up--- per [facility] records she was observed to be twitching --- she was started on keppra 1000 bid but this was d/c when she left the hospital.  She was monitored on LTM at [facility] -- it showed increased cortical irritability and high epileptogenic potential but no clear seizures ---  She was loaded with keppra and dilantin with clinical improvement.  Pt was con't on keppra 2000mg  bid  She also required cardene drip for bp control but was weaned off.  She was d/c on losartan 50 mg and norvasc 10 mg  Pt d/c with home pt, ot and speech therapy.   Her daughters take turns staying with her.  She goes between her wheel chair and lift chair at home.   Patient Care Team: Zola Button, Grayling Congress, DO as PCP - General (Family Medicine) Blondell Reveal, MD as Consulting Physician (Obstetrics and Gynecology) Titus Dubin. Loletta Parish, MD as Consulting Physician (Pain Medicine)   Past Medical History:  Diagnosis Date  . Arthritis   . 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 Hypertension     Past Surgical History:  Procedure Laterality Date  . BACK SURGERY     Dec 18, 2016  Dr.Torrealba  . BREAST BIOPSY Right   . BREAST EXCISIONAL BIOPSY    . CERVICAL SPINE SURGERY     C4  . CESAREAN SECTION    . GASTRIC BYPASS  03/2007  . TONSILLECTOMY      Family History  Problem Relation Age of Onset  . High blood pressure Mother   . Diabetes Mother   . Diabetes Father   . Stroke Father   . Cancer Sister        Colorectal cancer  . Cancer Brother        colon    Social History   Socioeconomic History  . Marital status: Divorced    Spouse name: Not on file  . Number of children: 3  . Years of education: 48yrs  . Highest education level: Not on file  Social Needs  . Financial resource strain: Not on file  . Food insecurity - worry: Not on file  . Food insecurity - inability: Not on file  . Transportation needs - medical: Not on file  . Transportation needs - non-medical: Not on file  Occupational History  . 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 Smoking status: Former Smoker  Years: 4.00    Last attempt to quit: 07/30/1979    Years since quitting: 38.1  . Smokeless tobacco: Never Used  Substance and Sexual Activity  . Alcohol use: No    Alcohol/week: 0.0 oz  . Drug use: No  . Sexual activity: No    Partners: Male  Other Topics Concern  . Not on file  Social History Narrative   Patient lives at home alone.   Caffeine Use: 1 cup daily   1 dog    Outpatient Medications Prior to Visit  Medication Sig Dispense Refill  . acetaminophen (TYLENOL) 325 MG tablet Take 1 tablet by mouth every 8 (eight) hours as needed.    Marland Kitchen amLODipine (NORVASC) 10 MG tablet TAKE 1 TABLET EVERY DAY 90 tablet 1  . aspirin 81 MG tablet Take 81 mg by mouth daily.    . calcium carbonate (OS-CAL - DOSED IN MG OF ELEMENTAL CALCIUM) 1250 (500 Ca) MG tablet 1 tablet daily.    . Cholecalciferol (VITAMIN D3) 1000 units CAPS Take by mouth.    . ferrous sulfate 325 (65 FE) MG tablet Take 1 tablet by mouth daily with  breakfast.    . levETIRAcetam (KEPPRA) 1000 MG tablet Take 2 tablets by mouth 2 (two) times daily.    Marland Kitchen levothyroxine (SYNTHROID, LEVOTHROID) 100 MCG tablet TAKE 1 TABLET EVERY DAY 90 tablet 0  . losartan (COZAAR) 50 MG tablet Take 50 mg by mouth daily.    Marland Kitchen LYRICA 50 MG capsule     . mirtazapine (REMERON) 15 MG tablet Take 1 tablet by mouth at bedtime.    . pantoprazole (PROTONIX) 40 MG tablet Take 1 tablet by mouth daily.    . polyethylene glycol (MIRALAX / GLYCOLAX) packet Take 17 g by mouth every other day.    . senna-docusate (SENOKOT-S) 8.6-50 MG tablet Take 1 tablet by mouth 2 (two) times daily.    . traMADol (ULTRAM) 50 MG tablet Take 100 mg by mouth 2 (two) times daily as needed.    . cyclobenzaprine (FLEXERIL) 10 MG tablet Take 10 mg by mouth 3 (three) times daily as needed.    Marland Kitchen FLUZONE HIGH-DOSE 0.5 ML injection TO BE ADMINISTERED BY PHARMACIST FOR IMMUNIZATION  0  . gabapentin (NEURONTIN) 300 MG capsule     . losartan (COZAAR) 100 MG tablet TAKE 1 TABLET EVERY DAY 90 tablet 1  . spironolactone (ALDACTONE) 25 MG tablet Take 1 tablet (25 mg total) by mouth daily. 90 tablet 3  . traZODone (DESYREL) 50 MG tablet TAKE 1/2 TO 1 TABLET AT NIGHT AS NEEDED 90 tablet 1   No facility-administered medications prior to visit.     Allergies  Allergen Reactions  . Tape Rash  . Meperidine Hcl Nausea And Vomiting  . Sulfonamide Derivatives Itching  . Bacitracin-Polymyxin B Dermatitis    "Cloth Band-Aid only"  . Oxycodone Itching    Review of Systems  Constitutional: Negative for chills, fever and malaise/fatigue.  HENT: Negative for congestion and hearing loss.   Eyes: Negative for discharge.  Respiratory: Negative for cough, sputum production and shortness of breath.   Cardiovascular: Negative for chest pain, palpitations and leg swelling.  Gastrointestinal: Negative for abdominal pain, blood in stool, constipation, diarrhea, heartburn, nausea and vomiting.  Genitourinary: Positive  for frequency. Negative for dysuria, hematuria and urgency.  Musculoskeletal: Negative for back pain, falls and myalgias.  Skin: Negative for rash.  Neurological: Positive for focal weakness and seizures. Negative for dizziness, sensory change, loss of consciousness,  weakness and headaches.  Endo/Heme/Allergies: Negative for environmental allergies. Does not bruise/bleed easily.  Psychiatric/Behavioral: Negative for depression and suicidal ideas. The patient is not nervous/anxious and does not have insomnia.        Objective:    Physical Exam  Constitutional: She is oriented to person, place, and time. She appears well-developed and well-nourished.  HENT:  Head: Normocephalic and atraumatic.  Eyes: Conjunctivae and EOM are normal.  Neck: Normal range of motion. Neck supple. No JVD present. Carotid bruit is not present. No thyromegaly present.  Cardiovascular: Normal rate, regular rhythm and normal heart sounds.  No murmur heard. Pulmonary/Chest: Effort normal and breath sounds normal. No respiratory distress. She has no wheezes. She has no rales. She exhibits no tenderness.  Musculoskeletal: She exhibits no edema.  Neurological: She is alert and oriented to person, place, and time.  R leg and UE weakness due to ICH Speech is clear ----  She will slur when tired   Psychiatric: She has a normal mood and affect.  Nursing note and vitals reviewed.   BP (!) 127/91 (BP Location: Left Arm, Cuff Size: Large)   Pulse (!) 58   Temp 97.8 F (36.6 C) (Oral)   Resp 16   SpO2 97%  Wt Readings from Last 3 Encounters:  05/23/17 195 lb (88.5 kg)  04/28/17 197 lb (89.4 kg)  03/24/17 198 lb 12.8 oz (90.2 kg)   BP Readings from Last 3 Encounters:  09/22/17 (!) 127/91  05/23/17 (!) 142/92  04/28/17 (!) 152/85     Immunization History  Administered Date(s) Administered  . Influenza Whole 05/04/2010  . Influenza, High Dose Seasonal PF 04/29/2016  . Influenza-Unspecified 04/29/2015,  04/19/2017  . Pneumococcal-Unspecified 07/29/2013  . Td 07/30/2007    Health Maintenance  Topic Date Due  . Hepatitis C Screening  [birth year]  . PNA vac Low Risk Adult (1 of 2 - PCV13) 08/13/2015  . TETANUS/TDAP  07/29/2017  . COLONOSCOPY  07/29/2018  . MAMMOGRAM  04/29/2019  . INFLUENZA VACCINE  Completed  . DEXA SCAN  Completed    Lab Results  Component Value Date   WBC 4.4 11/19/2016   WBC 4.4 11/19/2016   HGB 13.1 11/19/2016   HGB 13.1 11/19/2016   HCT 39.5 11/19/2016   HCT 39.5 11/19/2016   PLT 169.0 11/19/2016   PLT 169.0 11/19/2016   GLUCOSE 85 05/23/2017   CHOL 122 05/23/2017   TRIG 74.0 05/23/2017   HDL 41.40 05/23/2017   LDLCALC 66 05/23/2017   ALT 40 (H) 05/23/2017   AST 61 (H) 05/23/2017   NA 139 05/23/2017   K 3.5 05/23/2017   CL 104 05/23/2017   CREATININE 0.86 05/23/2017   BUN 14 05/23/2017   CO2 30 05/23/2017   TSH 1.71 11/19/2016   INR 1.1 (H) 11/19/2016   HGBA1C 5.8 01/26/2016   MICROALBUR 1.0 01/26/2016    Lab Results  Component Value Date   TSH 1.71 11/19/2016   Lab Results  Component Value Date   WBC 4.4 11/19/2016   WBC 4.4 11/19/2016   HGB 13.1 11/19/2016   HGB 13.1 11/19/2016   HCT 39.5 11/19/2016   HCT 39.5 11/19/2016   MCV 91.9 11/19/2016   MCV 91.9 11/19/2016   PLT 169.0 11/19/2016   PLT 169.0 11/19/2016   Lab Results  Component Value Date   NA 139 05/23/2017   K 3.5 05/23/2017   CO2 30 05/23/2017   GLUCOSE 85 05/23/2017   BUN 14 05/23/2017   CREATININE 0.86  05/23/2017   BILITOT 0.6 05/23/2017   ALKPHOS 108 05/23/2017   AST 61 (H) 05/23/2017   ALT 40 (H) 05/23/2017   PROT 7.3 05/23/2017   ALBUMIN 3.7 05/23/2017   CALCIUM 9.4 05/23/2017   GFR 84.70 05/23/2017   Lab Results  Component Value Date   CHOL 122 05/23/2017   Lab Results  Component Value Date   HDL 41.40 05/23/2017   Lab Results  Component Value Date   LDLCALC 66 05/23/2017   Lab Results  Component Value Date   TRIG 74.0 05/23/2017   Lab  Results  Component Value Date   CHOLHDL 3 05/23/2017   Lab Results  Component Value Date   HGBA1C 5.8 01/26/2016         Assessment & Plan:   Problem List Items Addressed This Visit      Unprioritized   Chronic kidney disease (CKD), stage III (moderate) (HCC)    Check labs today      Essential hypertension    Well controlled, no changes to meds. Encouraged heart healthy diet such as the DASH diet and exercise as tolerated.       Relevant Medications   losartan (COZAAR) 50 MG tablet   Other Relevant Orders   Lipid panel   Comprehensive metabolic panel   TSH   Hyperlipidemia - Primary   Relevant Medications   losartan (COZAAR) 50 MG tablet   Other Relevant Orders   Lipid panel   Comprehensive metabolic panel   TSH   Hyperlipidemia LDL goal <100    Check labs  Tolerating statin, encouraged heart healthy diet, avoid trans fats, minimize simple carbs and saturated fats. Increase exercise as tolerated      Relevant Medications   losartan (COZAAR) 50 MG tablet   Hypothyroidism    Check labs today con't synthroid      Urinary frequency    Check urine --- pt unable to give sample Daughter will get sample from pt and drop it off for us to check      Relevant Orders   POCT Urinalysis Dipstick (Automated)      I have discontinued Rei Latini's cyclobenzaprine, traZODone, gabapentin, FLUZONE HIGH-DOSE, and spironolactone. I am also having her maintain her aspirin, Vitamin D3, traMADol, amLODipine, levothyroxine, LYRICA, levETIRAcetam, acetaminophen, calcium carbonate, ferrous sulfate, mirtazapine, pantoprazole, polyethylene glycol, senna-docusate, and losartan.  No orders of the defined types were placed in this encounter.   CMA served as Neurosurgeonscribe during this visit. History, Physical and Plan performed by medical provider. Documentation and orders reviewed and attested to.  Donato SchultzYvonne R Lowne Chase, DO

## 2017-09-22 NOTE — Telephone Encounter (Signed)
Received Physician Orders from [facility]; forwarded to provider/SLS 02/25

## 2017-09-22 NOTE — Assessment & Plan Note (Signed)
Well controlled, no changes to meds. Encouraged heart healthy diet such as the DASH diet and exercise as tolerated.  °

## 2017-09-22 NOTE — Assessment & Plan Note (Signed)
Check labs today.

## 2017-09-22 NOTE — Assessment & Plan Note (Signed)
Check labs  Tolerating statin, encouraged heart healthy diet, avoid trans fats, minimize simple carbs and saturated fats. Increase exercise as tolerated 

## 2017-09-22 NOTE — Assessment & Plan Note (Signed)
Check urine --- pt unable to give sample Daughter will get sample from pt and drop it off for us to check

## 2017-09-22 NOTE — Assessment & Plan Note (Signed)
Check labs today °con't synthroid °

## 2017-09-22 NOTE — Patient Instructions (Signed)
Intracranial Hemorrhage  An intracranial hemorrhage is bleeding in the layers between the skull (cranium) and brain. A blood vessel bursts and allows blood to leak inside the cranial cavity. The leaking blood then collects (hematoma). This causes pressure and damage to brain cells. The bleeding can be mild to severe. In severe cases, it can lead to permanent damage or death. Symptoms may come on suddenly or develop over time. Early diagnosis and treatment leads to better recovery.  There are four types of intracranial hemorrhage: subarachnoid, subdural, extradural, or cerebral hemorrhage.  What are the causes?   Head injury (trauma).   Ruptured brain aneurysm.   Bleeding from blood vessels that develop abnormally (arteriovenous malformation).   Bleeding disorder.   Use of blood thinners (anticoagulants).   Use of certain drugs, such as cocaine.  For some people with intracranial hemorrhage, the cause is unknown.  What increases the risk?   Using tobacco products, such as cigarettes and chewing tobacco.   Having high blood pressure (hypertension).   Abusing alcohol.   Being a female, especially of postmenopausal age.   Having a family history of disease in the blood vessels of the brain (cerebrovascular disease).   Having certain genetic syndromes that result in kidney disease or connective tissue disease.  What are the signs or symptoms?   A sudden, severe headache with no known cause. The headache is often described as the worst headache ever experienced.   Nausea or vomiting, especially when combined with other symptoms such as a headache.   Sudden weakness or numbness of the face, arm, or leg, especially on one side of the body.   Sudden trouble walking or difficulty moving arms or legs.   Sudden confusion.   Sudden personality changes.   Trouble speaking (aphasia) or understanding.   Difficulty swallowing.   Sudden trouble seeing in one or both eyes.   Double vision.   Dizziness.   Loss  of balance or coordination.   Intolerance to light.   Stiff neck.  How is this diagnosed?  Your health care provider will perform a physical exam and ask about your symptoms. If an intracranial hemorrhage is suspected, various tests may be ordered. These tests may include:   A CT scan.   An MRI.   A cerebral angiogram.   A spinal tap (lumbar puncture).   Blood tests.    How is this treated?  Immediate treatment in the hospital is often required to reduce the risk of brain damage. Treatment will depend on the cause of the bleeding, where it is located, and the extent of the bleeding and damage. The goals of treatment include stopping the bleeding, repairing the cause of bleeding, providing relief of symptoms, and preventing problems.   Medicines may be given to:  ? Lower blood pressure (antihypertensives).  ? Relieve pain (analgesics).  ? Relieve nausea or vomiting.   Surgery may be needed to stop the bleeding, repair the cause of the bleeding, or remove the blood.   Rehabilitation may be needed to improve any cognitive and day-to-day functions impaired by the condition.    Further treatment depends on the duration, severity, and cause of your symptoms. Physical, speech, and occupational therapists will assess you and work to improve any functions impaired by the intracranial hemorrhage. Measures will be taken to prevent short-term and long-term problems, including infection from breathing foreign material into the lungs (aspiration pneumonia), blood clots in the legs, bedsores, and falls.  Follow these instructions at home:     Take medicines only as directed by your health care provider.   Eat healthy foods as directed by your health care provider:  ? A diet low in salt (sodium), saturated fat, trans fat, and cholesterol may be recommended to manage your blood pressure.  ? Foods may need to be soft or pureed, or small bites may need to be taken in order to avoid aspirating or choking.  ? If studies show  that your ability to swallow safely has been affected, you may need to seek help from specialists such as a dietitian, speech and language pathologist, or an occupational therapist. These health care providers can teach you how to safely get the nutrition your body needs.   Rest and limit activities or movements as directed by your health care provider.   Do not use any tobacco products including cigarettes, chewing tobacco, or electronic cigarettes. If you need help quitting, ask your health care provider.   Limit alcohol intake to no more than 1 drink per day for nonpregnant women and 2 drinks per day for men. One drink equals 12 ounces of beer, 5 ounces of wine, or 1 ounces of hard liquor.   Make any other lifestyle changes as directed by your health care provider.   Monitor and record your blood pressure as directed by your health care provider.   A safe home environment is important to reduce the risk of falls. Your health care provider may arrange for specialists to evaluate your home. Having grab bars in the bedroom and bathroom is often important. Your health care provider may arrange for special equipment to be used at home, such as raised toilets and a seat for the shower.   Do physical, occupational, and speech therapy as directed by your health care provider. Ongoing therapy may be needed to maximize your recovery.   Use a walker or a cane at all times if directed by your health care provider.   Keep all follow-up visits with your health care provider and other specialists. This is important. This includes any referrals, physical therapy, and rehabilitation.  Get help right away if:   You have a sudden, severe headache with no known cause.   You have nausea or vomiting occurring with another symptom.   You have sudden weakness or numbness of the face, arm, or leg, especially on one side of the body.   You have sudden trouble walking or difficulty moving your arms or legs.   You have sudden  confusion.   You have trouble speaking (aphasia) or understanding.   You have sudden trouble seeing in one or both eyes.   You have a sudden loss of balance or coordination.   You have a stiff neck.   You have difficulty breathing.   You have a partial or total loss of consciousness.  These symptoms may represent a serious problem that is an emergency. Do not wait to see if the symptoms will go away. Get medical help right away. Call your local emergency services (911 in the [place] Do not drive yourself to the hospital.  This information is not intended to replace advice given to you by your health care provider. Make sure you discuss any questions you have with your health care provider.  Document Released: 02/09/2014 Document Revised: 12/15/2015 Document Reviewed: 09/08/2013  Elsevier Interactive Patient Education  2018 Elsevier Inc[employer]

## 2017-09-23 DIAGNOSIS — M5136 Other intervertebral disc degeneration, lumbar region: Secondary | ICD-10-CM | POA: Diagnosis not present

## 2017-09-23 DIAGNOSIS — E1122 Type 2 diabetes mellitus with diabetic chronic kidney disease: Secondary | ICD-10-CM | POA: Diagnosis not present

## 2017-09-23 DIAGNOSIS — I69398 Other sequelae of cerebral infarction: Secondary | ICD-10-CM | POA: Diagnosis not present

## 2017-09-23 DIAGNOSIS — N183 Chronic kidney disease, stage 3 (moderate): Secondary | ICD-10-CM | POA: Diagnosis not present

## 2017-09-23 DIAGNOSIS — R569 Unspecified convulsions: Secondary | ICD-10-CM | POA: Diagnosis not present

## 2017-09-23 DIAGNOSIS — I69320 Aphasia following cerebral infarction: Secondary | ICD-10-CM | POA: Diagnosis not present

## 2017-09-23 DIAGNOSIS — M4726 Other spondylosis with radiculopathy, lumbar region: Secondary | ICD-10-CM | POA: Diagnosis not present

## 2017-09-23 DIAGNOSIS — I69351 Hemiplegia and hemiparesis following cerebral infarction affecting right dominant side: Secondary | ICD-10-CM | POA: Diagnosis not present

## 2017-09-23 DIAGNOSIS — I129 Hypertensive chronic kidney disease with stage 1 through stage 4 chronic kidney disease, or unspecified chronic kidney disease: Secondary | ICD-10-CM | POA: Diagnosis not present

## 2017-09-23 MED ORDER — LEVOTHYROXINE SODIUM 88 MCG PO TABS: 88.0000 ug | ORAL_TABLET | Freq: Every day | ORAL | 2 refills | Status: DC

## 2017-09-23 NOTE — Addendum Note (Signed)
Addended by: Steve RattlerBLEVINS, BAILEY A on: 09/23/2017 03:10 PM   Modules accepted: Orders

## 2017-09-24 DIAGNOSIS — I69351 Hemiplegia and hemiparesis following cerebral infarction affecting right dominant side: Secondary | ICD-10-CM | POA: Diagnosis not present

## 2017-09-24 DIAGNOSIS — I69320 Aphasia following cerebral infarction: Secondary | ICD-10-CM | POA: Diagnosis not present

## 2017-09-24 DIAGNOSIS — N183 Chronic kidney disease, stage 3 (moderate): Secondary | ICD-10-CM | POA: Diagnosis not present

## 2017-09-24 DIAGNOSIS — E1122 Type 2 diabetes mellitus with diabetic chronic kidney disease: Secondary | ICD-10-CM | POA: Diagnosis not present

## 2017-09-24 DIAGNOSIS — M4726 Other spondylosis with radiculopathy, lumbar region: Secondary | ICD-10-CM | POA: Diagnosis not present

## 2017-09-24 DIAGNOSIS — I129 Hypertensive chronic kidney disease with stage 1 through stage 4 chronic kidney disease, or unspecified chronic kidney disease: Secondary | ICD-10-CM | POA: Diagnosis not present

## 2017-09-24 DIAGNOSIS — I69398 Other sequelae of cerebral infarction: Secondary | ICD-10-CM | POA: Diagnosis not present

## 2017-09-24 DIAGNOSIS — R569 Unspecified convulsions: Secondary | ICD-10-CM | POA: Diagnosis not present

## 2017-09-24 DIAGNOSIS — M5136 Other intervertebral disc degeneration, lumbar region: Secondary | ICD-10-CM | POA: Diagnosis not present

## 2017-09-24 NOTE — Telephone Encounter (Signed)
Orders signed and faxed to [phone]. Form sent for scanning.

## 2017-09-25 ENCOUNTER — Telehealth: Payer: Self-pay | Admitting: Family Medicine

## 2017-09-25 DIAGNOSIS — E1122 Type 2 diabetes mellitus with diabetic chronic kidney disease: Secondary | ICD-10-CM | POA: Diagnosis not present

## 2017-09-25 DIAGNOSIS — I129 Hypertensive chronic kidney disease with stage 1 through stage 4 chronic kidney disease, or unspecified chronic kidney disease: Secondary | ICD-10-CM | POA: Diagnosis not present

## 2017-09-25 DIAGNOSIS — M5136 Other intervertebral disc degeneration, lumbar region: Secondary | ICD-10-CM | POA: Diagnosis not present

## 2017-09-25 DIAGNOSIS — I69320 Aphasia following cerebral infarction: Secondary | ICD-10-CM | POA: Diagnosis not present

## 2017-09-25 DIAGNOSIS — N183 Chronic kidney disease, stage 3 (moderate): Secondary | ICD-10-CM | POA: Diagnosis not present

## 2017-09-25 DIAGNOSIS — R569 Unspecified convulsions: Secondary | ICD-10-CM | POA: Diagnosis not present

## 2017-09-25 DIAGNOSIS — I69351 Hemiplegia and hemiparesis following cerebral infarction affecting right dominant side: Secondary | ICD-10-CM | POA: Diagnosis not present

## 2017-09-25 DIAGNOSIS — M4726 Other spondylosis with radiculopathy, lumbar region: Secondary | ICD-10-CM | POA: Diagnosis not present

## 2017-09-25 DIAGNOSIS — I69398 Other sequelae of cerebral infarction: Secondary | ICD-10-CM | POA: Diagnosis not present

## 2017-09-25 NOTE — Telephone Encounter (Signed)
Copied from CRM [phone] [id number]. Topic: Quick Communication - Rx Refill/Question >> Sep 25, 2017  2:35 PM Cipriano BunkerLambe, Annette S wrote: Medication:  levETIRAcetam (KEPPRA) 1000 MG tablet   [facility] is wanting to change to 500 mg. Needs approval  She only has dosage for tonight and needs some for tomorrow.   High Priority  Has the patient contacted their pharmacy? Yes.     (Agent: If no, request that the patient contact the pharmacy for the refill.)   Preferred Pharmacy (with phone number or street name):   [id number] - [place], [place] - [id number] [place] [id number] Phone: [phone] Fax: [phone]     Agent: Please be advised that RX refills may take up to 3 business days. We ask that you follow-up with your pharmacy.

## 2017-09-25 NOTE — Telephone Encounter (Signed)
[facility] sent a fax that keppra 1000mg  is on back order.  500mg  they have temporarily.  Is it ok to do  500mg  take 4 tabs bid?

## 2017-09-25 NOTE — Telephone Encounter (Signed)
OK 

## 2017-09-26 DIAGNOSIS — E1122 Type 2 diabetes mellitus with diabetic chronic kidney disease: Secondary | ICD-10-CM | POA: Diagnosis not present

## 2017-09-26 DIAGNOSIS — R569 Unspecified convulsions: Secondary | ICD-10-CM | POA: Diagnosis not present

## 2017-09-26 DIAGNOSIS — I69351 Hemiplegia and hemiparesis following cerebral infarction affecting right dominant side: Secondary | ICD-10-CM | POA: Diagnosis not present

## 2017-09-26 DIAGNOSIS — M5136 Other intervertebral disc degeneration, lumbar region: Secondary | ICD-10-CM | POA: Diagnosis not present

## 2017-09-26 DIAGNOSIS — M4726 Other spondylosis with radiculopathy, lumbar region: Secondary | ICD-10-CM | POA: Diagnosis not present

## 2017-09-26 DIAGNOSIS — N183 Chronic kidney disease, stage 3 (moderate): Secondary | ICD-10-CM | POA: Diagnosis not present

## 2017-09-26 DIAGNOSIS — I129 Hypertensive chronic kidney disease with stage 1 through stage 4 chronic kidney disease, or unspecified chronic kidney disease: Secondary | ICD-10-CM | POA: Diagnosis not present

## 2017-09-26 DIAGNOSIS — I69320 Aphasia following cerebral infarction: Secondary | ICD-10-CM | POA: Diagnosis not present

## 2017-09-26 DIAGNOSIS — I69398 Other sequelae of cerebral infarction: Secondary | ICD-10-CM | POA: Diagnosis not present

## 2017-09-26 MED ORDER — LEVETIRACETAM 500 MG PO TABS: 2000.0000 mg | ORAL_TABLET | Freq: Two times a day (BID) | ORAL | 0 refills | Status: DC

## 2017-09-26 NOTE — Telephone Encounter (Signed)
Patient notified and rx sent in for 500mg 

## 2017-09-26 NOTE — Addendum Note (Signed)
Addended by: Thelma BargeICHARDSON, SHEKETIA D on: 09/26/2017 11:41 AM   Modules accepted: Orders

## 2017-09-29 DIAGNOSIS — I129 Hypertensive chronic kidney disease with stage 1 through stage 4 chronic kidney disease, or unspecified chronic kidney disease: Secondary | ICD-10-CM | POA: Diagnosis not present

## 2017-09-29 DIAGNOSIS — M4726 Other spondylosis with radiculopathy, lumbar region: Secondary | ICD-10-CM | POA: Diagnosis not present

## 2017-09-29 DIAGNOSIS — M5136 Other intervertebral disc degeneration, lumbar region: Secondary | ICD-10-CM | POA: Diagnosis not present

## 2017-09-29 DIAGNOSIS — I69398 Other sequelae of cerebral infarction: Secondary | ICD-10-CM | POA: Diagnosis not present

## 2017-09-29 DIAGNOSIS — R569 Unspecified convulsions: Secondary | ICD-10-CM | POA: Diagnosis not present

## 2017-09-29 DIAGNOSIS — N183 Chronic kidney disease, stage 3 (moderate): Secondary | ICD-10-CM | POA: Diagnosis not present

## 2017-09-29 DIAGNOSIS — E1122 Type 2 diabetes mellitus with diabetic chronic kidney disease: Secondary | ICD-10-CM | POA: Diagnosis not present

## 2017-09-29 DIAGNOSIS — I69351 Hemiplegia and hemiparesis following cerebral infarction affecting right dominant side: Secondary | ICD-10-CM | POA: Diagnosis not present

## 2017-09-29 DIAGNOSIS — I69320 Aphasia following cerebral infarction: Secondary | ICD-10-CM | POA: Diagnosis not present

## 2017-09-30 DIAGNOSIS — I69320 Aphasia following cerebral infarction: Secondary | ICD-10-CM | POA: Diagnosis not present

## 2017-09-30 DIAGNOSIS — I69398 Other sequelae of cerebral infarction: Secondary | ICD-10-CM | POA: Diagnosis not present

## 2017-09-30 DIAGNOSIS — M4726 Other spondylosis with radiculopathy, lumbar region: Secondary | ICD-10-CM | POA: Diagnosis not present

## 2017-09-30 DIAGNOSIS — I129 Hypertensive chronic kidney disease with stage 1 through stage 4 chronic kidney disease, or unspecified chronic kidney disease: Secondary | ICD-10-CM | POA: Diagnosis not present

## 2017-09-30 DIAGNOSIS — M5136 Other intervertebral disc degeneration, lumbar region: Secondary | ICD-10-CM | POA: Diagnosis not present

## 2017-09-30 DIAGNOSIS — E1122 Type 2 diabetes mellitus with diabetic chronic kidney disease: Secondary | ICD-10-CM | POA: Diagnosis not present

## 2017-09-30 DIAGNOSIS — N183 Chronic kidney disease, stage 3 (moderate): Secondary | ICD-10-CM | POA: Diagnosis not present

## 2017-09-30 DIAGNOSIS — I69351 Hemiplegia and hemiparesis following cerebral infarction affecting right dominant side: Secondary | ICD-10-CM | POA: Diagnosis not present

## 2017-09-30 DIAGNOSIS — R569 Unspecified convulsions: Secondary | ICD-10-CM | POA: Diagnosis not present

## 2017-10-02 ENCOUNTER — Telehealth: Payer: Self-pay

## 2017-10-02 DIAGNOSIS — I69398 Other sequelae of cerebral infarction: Secondary | ICD-10-CM | POA: Diagnosis not present

## 2017-10-02 DIAGNOSIS — I69351 Hemiplegia and hemiparesis following cerebral infarction affecting right dominant side: Secondary | ICD-10-CM | POA: Diagnosis not present

## 2017-10-02 DIAGNOSIS — E1122 Type 2 diabetes mellitus with diabetic chronic kidney disease: Secondary | ICD-10-CM | POA: Diagnosis not present

## 2017-10-02 DIAGNOSIS — I69320 Aphasia following cerebral infarction: Secondary | ICD-10-CM | POA: Diagnosis not present

## 2017-10-02 DIAGNOSIS — M5136 Other intervertebral disc degeneration, lumbar region: Secondary | ICD-10-CM | POA: Diagnosis not present

## 2017-10-02 DIAGNOSIS — R569 Unspecified convulsions: Secondary | ICD-10-CM | POA: Diagnosis not present

## 2017-10-02 DIAGNOSIS — I129 Hypertensive chronic kidney disease with stage 1 through stage 4 chronic kidney disease, or unspecified chronic kidney disease: Secondary | ICD-10-CM | POA: Diagnosis not present

## 2017-10-02 DIAGNOSIS — N183 Chronic kidney disease, stage 3 (moderate): Secondary | ICD-10-CM | POA: Diagnosis not present

## 2017-10-02 DIAGNOSIS — M4726 Other spondylosis with radiculopathy, lumbar region: Secondary | ICD-10-CM | POA: Diagnosis not present

## 2017-10-02 NOTE — Telephone Encounter (Signed)
Increase the losartan to 100 mg #30  2 refills Ov 2-3 weeks

## 2017-10-02 NOTE — Telephone Encounter (Signed)
Copied from CRM [id number]. Topic: Inquiry >> Oct 02, 2017 11:24 AM Alexander BergeronBarksdale, Harvey B wrote: Reason for CRM: Modesto CharonPatrick Smith PTA w/ [facility] called to give some bp readings 1st reading: 153/110 2nd reading: 169/114 this was given 10 mins later  Contact [phone]

## 2017-10-03 ENCOUNTER — Telehealth: Payer: Self-pay | Admitting: *Deleted

## 2017-10-03 DIAGNOSIS — I69320 Aphasia following cerebral infarction: Secondary | ICD-10-CM | POA: Diagnosis not present

## 2017-10-03 DIAGNOSIS — E1122 Type 2 diabetes mellitus with diabetic chronic kidney disease: Secondary | ICD-10-CM | POA: Diagnosis not present

## 2017-10-03 DIAGNOSIS — M4726 Other spondylosis with radiculopathy, lumbar region: Secondary | ICD-10-CM | POA: Diagnosis not present

## 2017-10-03 DIAGNOSIS — N183 Chronic kidney disease, stage 3 (moderate): Secondary | ICD-10-CM | POA: Diagnosis not present

## 2017-10-03 DIAGNOSIS — M4800 Spinal stenosis, site unspecified: Secondary | ICD-10-CM | POA: Diagnosis not present

## 2017-10-03 DIAGNOSIS — I69398 Other sequelae of cerebral infarction: Secondary | ICD-10-CM | POA: Diagnosis not present

## 2017-10-03 DIAGNOSIS — R569 Unspecified convulsions: Secondary | ICD-10-CM | POA: Diagnosis not present

## 2017-10-03 DIAGNOSIS — I129 Hypertensive chronic kidney disease with stage 1 through stage 4 chronic kidney disease, or unspecified chronic kidney disease: Secondary | ICD-10-CM | POA: Diagnosis not present

## 2017-10-03 DIAGNOSIS — M5136 Other intervertebral disc degeneration, lumbar region: Secondary | ICD-10-CM | POA: Diagnosis not present

## 2017-10-03 DIAGNOSIS — I69351 Hemiplegia and hemiparesis following cerebral infarction affecting right dominant side: Secondary | ICD-10-CM | POA: Diagnosis not present

## 2017-10-03 NOTE — Telephone Encounter (Signed)
Do you still want to increase the blood pressure med.  Patient was upset because other daughter will not help take care of her.  She is also depressed because she cant do the things she use to.  The nurse for constipation gave them some suggestions that they can try over the counter.

## 2017-10-03 NOTE — Telephone Encounter (Signed)
Patient daughter came in on 09/22/17 and said she have gave Dr. Laury AxonLowne a form [facility].  Do you have this around anywhere?

## 2017-10-03 NOTE — Telephone Encounter (Signed)
Patient states that pt was in distress yesterday & that may have affected the reading. Daughter states that reading today is 130/72 was the last one. Please advise if increase is still needed.  Patient is constipated. Home health Nurse has suggested Cymbalta & xanax for anxiety/ depression.  Patient has [facility] paper work that needs to be filled out. Daughter states that paperwork was given on her last ov 09/22/17.  Daughter- [name] [phone] Oldest daughter assisting the most...Marland Kitchen

## 2017-10-03 NOTE — Telephone Encounter (Signed)
We can hold off on bp inc  Can try cymbalta 30 mg #30  1 po qd  2 refills

## 2017-10-05 ENCOUNTER — Encounter (HOSPITAL_BASED_OUTPATIENT_CLINIC_OR_DEPARTMENT_OTHER): Payer: Self-pay | Admitting: Emergency Medicine

## 2017-10-05 ENCOUNTER — Emergency Department (HOSPITAL_COMMUNITY): Payer: Medicare HMO

## 2017-10-05 ENCOUNTER — Emergency Department (HOSPITAL_BASED_OUTPATIENT_CLINIC_OR_DEPARTMENT_OTHER): Payer: Medicare HMO

## 2017-10-05 ENCOUNTER — Other Ambulatory Visit: Payer: Self-pay

## 2017-10-05 ENCOUNTER — Inpatient Hospital Stay (HOSPITAL_BASED_OUTPATIENT_CLINIC_OR_DEPARTMENT_OTHER)
Admission: EM | Admit: 2017-10-05 | Discharge: 2017-10-15 | DRG: 101 | Disposition: A | Discharge status: Other Acute Inpatient Hospital | Payer: Medicare HMO | Attending: Family Medicine | Admitting: Family Medicine

## 2017-10-05 DIAGNOSIS — R4701 Aphasia: Secondary | ICD-10-CM

## 2017-10-05 DIAGNOSIS — G9340 Encephalopathy, unspecified: Secondary | ICD-10-CM | POA: Diagnosis present

## 2017-10-05 DIAGNOSIS — R509 Fever, unspecified: Secondary | ICD-10-CM | POA: Diagnosis not present

## 2017-10-05 DIAGNOSIS — M545 Low back pain: Secondary | ICD-10-CM | POA: Diagnosis present

## 2017-10-05 DIAGNOSIS — I69320 Aphasia following cerebral infarction: Secondary | ICD-10-CM | POA: Diagnosis not present

## 2017-10-05 DIAGNOSIS — G40011 Localization-related (focal) (partial) idiopathic epilepsy and epileptic syndromes with seizures of localized onset, intractable, with status epilepticus: Secondary | ICD-10-CM | POA: Diagnosis not present

## 2017-10-05 DIAGNOSIS — J9811 Atelectasis: Secondary | ICD-10-CM | POA: Diagnosis not present

## 2017-10-05 DIAGNOSIS — K219 Gastro-esophageal reflux disease without esophagitis: Secondary | ICD-10-CM | POA: Diagnosis present

## 2017-10-05 DIAGNOSIS — Z800 Family history of malignant neoplasm of digestive organs: Secondary | ICD-10-CM

## 2017-10-05 DIAGNOSIS — G4733 Obstructive sleep apnea (adult) (pediatric): Secondary | ICD-10-CM | POA: Diagnosis present

## 2017-10-05 DIAGNOSIS — R451 Restlessness and agitation: Secondary | ICD-10-CM | POA: Diagnosis not present

## 2017-10-05 DIAGNOSIS — T420X5A Adverse effect of hydantoin derivatives, initial encounter: Secondary | ICD-10-CM | POA: Diagnosis not present

## 2017-10-05 DIAGNOSIS — Z9119 Patient's noncompliance with other medical treatment and regimen: Secondary | ICD-10-CM

## 2017-10-05 DIAGNOSIS — G40901 Epilepsy, unspecified, not intractable, with status epilepticus: Secondary | ICD-10-CM | POA: Diagnosis not present

## 2017-10-05 DIAGNOSIS — Z885 Allergy status to narcotic agent status: Secondary | ICD-10-CM

## 2017-10-05 DIAGNOSIS — E785 Hyperlipidemia, unspecified: Secondary | ICD-10-CM | POA: Diagnosis present

## 2017-10-05 DIAGNOSIS — R4781 Slurred speech: Secondary | ICD-10-CM | POA: Diagnosis not present

## 2017-10-05 DIAGNOSIS — I10 Essential (primary) hypertension: Secondary | ICD-10-CM | POA: Diagnosis present

## 2017-10-05 DIAGNOSIS — Z950 Presence of cardiac pacemaker: Secondary | ICD-10-CM

## 2017-10-05 DIAGNOSIS — R2990 Unspecified symptoms and signs involving the nervous system: Secondary | ICD-10-CM | POA: Diagnosis present

## 2017-10-05 DIAGNOSIS — N390 Urinary tract infection, site not specified: Secondary | ICD-10-CM | POA: Diagnosis not present

## 2017-10-05 DIAGNOSIS — Z79899 Other long term (current) drug therapy: Secondary | ICD-10-CM

## 2017-10-05 DIAGNOSIS — I6529 Occlusion and stenosis of unspecified carotid artery: Secondary | ICD-10-CM | POA: Diagnosis not present

## 2017-10-05 DIAGNOSIS — M4696 Unspecified inflammatory spondylopathy, lumbar region: Secondary | ICD-10-CM | POA: Diagnosis present

## 2017-10-05 DIAGNOSIS — F329 Major depressive disorder, single episode, unspecified: Secondary | ICD-10-CM | POA: Diagnosis present

## 2017-10-05 DIAGNOSIS — Z833 Family history of diabetes mellitus: Secondary | ICD-10-CM

## 2017-10-05 DIAGNOSIS — Z6831 Body mass index (BMI) 31.0-31.9, adult: Secondary | ICD-10-CM

## 2017-10-05 DIAGNOSIS — N183 Chronic kidney disease, stage 3 unspecified: Secondary | ICD-10-CM | POA: Diagnosis present

## 2017-10-05 DIAGNOSIS — R300 Dysuria: Secondary | ICD-10-CM | POA: Diagnosis not present

## 2017-10-05 DIAGNOSIS — D519 Vitamin B12 deficiency anemia, unspecified: Secondary | ICD-10-CM | POA: Diagnosis not present

## 2017-10-05 DIAGNOSIS — I2510 Atherosclerotic heart disease of native coronary artery without angina pectoris: Secondary | ICD-10-CM | POA: Diagnosis present

## 2017-10-05 DIAGNOSIS — G8929 Other chronic pain: Secondary | ICD-10-CM | POA: Diagnosis present

## 2017-10-05 DIAGNOSIS — I69351 Hemiplegia and hemiparesis following cerebral infarction affecting right dominant side: Secondary | ICD-10-CM | POA: Diagnosis not present

## 2017-10-05 DIAGNOSIS — I129 Hypertensive chronic kidney disease with stage 1 through stage 4 chronic kidney disease, or unspecified chronic kidney disease: Secondary | ICD-10-CM | POA: Diagnosis present

## 2017-10-05 DIAGNOSIS — Y92230 Patient room in hospital as the place of occurrence of the external cause: Secondary | ICD-10-CM | POA: Diagnosis not present

## 2017-10-05 DIAGNOSIS — G9389 Other specified disorders of brain: Secondary | ICD-10-CM | POA: Diagnosis present

## 2017-10-05 DIAGNOSIS — Z882 Allergy status to sulfonamides status: Secondary | ICD-10-CM

## 2017-10-05 DIAGNOSIS — E669 Obesity, unspecified: Secondary | ICD-10-CM | POA: Diagnosis not present

## 2017-10-05 DIAGNOSIS — E538 Deficiency of other specified B group vitamins: Secondary | ICD-10-CM | POA: Diagnosis not present

## 2017-10-05 DIAGNOSIS — E039 Hypothyroidism, unspecified: Secondary | ICD-10-CM | POA: Diagnosis present

## 2017-10-05 DIAGNOSIS — Z91048 Other nonmedicinal substance allergy status: Secondary | ICD-10-CM

## 2017-10-05 DIAGNOSIS — R569 Unspecified convulsions: Secondary | ICD-10-CM | POA: Diagnosis not present

## 2017-10-05 DIAGNOSIS — Z888 Allergy status to other drugs, medicaments and biological substances status: Secondary | ICD-10-CM

## 2017-10-05 DIAGNOSIS — G629 Polyneuropathy, unspecified: Secondary | ICD-10-CM | POA: Diagnosis present

## 2017-10-05 DIAGNOSIS — K5909 Other constipation: Secondary | ICD-10-CM | POA: Diagnosis present

## 2017-10-05 DIAGNOSIS — I680 Cerebral amyloid angiopathy: Secondary | ICD-10-CM | POA: Diagnosis not present

## 2017-10-05 DIAGNOSIS — Z87891 Personal history of nicotine dependence: Secondary | ICD-10-CM

## 2017-10-05 DIAGNOSIS — R502 Drug induced fever: Secondary | ICD-10-CM | POA: Diagnosis not present

## 2017-10-05 DIAGNOSIS — E119 Type 2 diabetes mellitus without complications: Secondary | ICD-10-CM

## 2017-10-05 DIAGNOSIS — I69298 Other sequelae of other nontraumatic intracranial hemorrhage: Secondary | ICD-10-CM | POA: Diagnosis not present

## 2017-10-05 DIAGNOSIS — Z7982 Long term (current) use of aspirin: Secondary | ICD-10-CM

## 2017-10-05 DIAGNOSIS — Z993 Dependence on wheelchair: Secondary | ICD-10-CM

## 2017-10-05 DIAGNOSIS — I69251 Hemiplegia and hemiparesis following other nontraumatic intracranial hemorrhage affecting right dominant side: Secondary | ICD-10-CM | POA: Diagnosis not present

## 2017-10-05 DIAGNOSIS — Z79890 Hormone replacement therapy (postmenopausal): Secondary | ICD-10-CM

## 2017-10-05 DIAGNOSIS — R1310 Dysphagia, unspecified: Secondary | ICD-10-CM | POA: Diagnosis not present

## 2017-10-05 DIAGNOSIS — G40909 Epilepsy, unspecified, not intractable, without status epilepticus: Secondary | ICD-10-CM

## 2017-10-05 DIAGNOSIS — Z823 Family history of stroke: Secondary | ICD-10-CM

## 2017-10-05 DIAGNOSIS — Z9884 Bariatric surgery status: Secondary | ICD-10-CM

## 2017-10-05 LAB — COMPREHENSIVE METABOLIC PANEL
ALT: 20 U/L (ref 14–54)
ANION GAP: 11 (ref 5–15)
AST: 38 U/L (ref 15–41)
Albumin: 3.8 g/dL (ref 3.5–5.0)
Alkaline Phosphatase: 68 U/L (ref 38–126)
BUN: 12 mg/dL (ref 6–20)
CHLORIDE: 105 mmol/L (ref 101–111)
CO2: 23 mmol/L (ref 22–32)
Calcium: 9.6 mg/dL (ref 8.9–10.3)
Creatinine, Ser: 0.75 mg/dL (ref 0.44–1.00)
GFR calc Af Amer: >60 mL/min (ref >60)
Glucose, Bld: 99 mg/dL (ref 65–99)
POTASSIUM: 3.4 mmol/L — AB (ref 3.5–5.1)
SODIUM: 139 mmol/L (ref 135–145)
Total Bilirubin: 0.7 mg/dL (ref 0.3–1.2)
Total Protein: 7.7 g/dL (ref 6.5–8.1)

## 2017-10-05 LAB — PROTIME-INR
INR: 1.01
PROTHROMBIN TIME: 13.2 s (ref 11.4–15.2)

## 2017-10-05 LAB — CBC
HCT: 38.3 % (ref 36.0–46.0)
Hemoglobin: 12.9 g/dL (ref 12.0–15.0)
MCH: 28.9 pg (ref 26.0–34.0)
MCHC: 33.7 g/dL (ref 30.0–36.0)
MCV: 85.9 fL (ref 78.0–100.0)
PLATELETS: 199 10*3/uL (ref 150–400)
RBC: 4.46 MIL/uL (ref 3.87–5.11)
RDW: 13.4 % (ref 11.5–15.5)
WBC: 7.8 10*3/uL (ref 4.0–10.5)

## 2017-10-05 LAB — DIFFERENTIAL
BASOS PCT: 0 %
Basophils Absolute: 0 10*3/uL (ref 0.0–0.1)
EOS ABS: 0.2 10*3/uL (ref 0.0–0.7)
Eosinophils Relative: 3 %
Lymphocytes Relative: 39 %
Lymphs Abs: 3.1 10*3/uL (ref 0.7–4.0)
Monocytes Absolute: 0.6 10*3/uL (ref 0.1–1.0)
Monocytes Relative: 8 %
NEUTROS ABS: 3.9 10*3/uL (ref 1.7–7.7)
Neutrophils Relative %: 50 %

## 2017-10-05 LAB — HEMOGLOBIN A1C
Hgb A1c MFr Bld: 5 % (ref 4.8–5.6)
MEAN PLASMA GLUCOSE: 96.8 mg/dL

## 2017-10-05 LAB — TROPONIN I

## 2017-10-05 LAB — APTT: APTT: 29 s (ref 24–36)

## 2017-10-05 LAB — CBG MONITORING, ED: GLUCOSE-CAPILLARY: 77 mg/dL (ref 65–99)

## 2017-10-05 MED ORDER — SODIUM CHLORIDE 0.9 % IV SOLN: 20.0000 mg/kg | INTRAVENOUS | Status: DC

## 2017-10-05 MED ORDER — SODIUM CHLORIDE 0.9 % IV SOLN
INTRAVENOUS | Status: AC
  Administered 2017-10-05 – 2017-10-06 (×2): via INTRAVENOUS

## 2017-10-05 MED ORDER — HYDRALAZINE HCL 20 MG/ML IJ SOLN: 5.0000 mg | INTRAMUSCULAR | Status: DC | PRN

## 2017-10-05 MED ORDER — ONDANSETRON HCL 4 MG/2ML IJ SOLN
INTRAMUSCULAR | Status: AC
  Administered 2017-10-05: 4 mg via INTRAVENOUS
  Filled 2017-10-05: qty 2

## 2017-10-05 MED ORDER — PHENYTOIN SODIUM 50 MG/ML IJ SOLN
100.0000 mg | Freq: Three times a day (TID) | INTRAMUSCULAR | Status: DC
  Administered 2017-10-05 – 2017-10-13 (×22): 100 mg via INTRAVENOUS
  Filled 2017-10-05 (×23): qty 2

## 2017-10-05 MED ORDER — LORAZEPAM 2 MG/ML IJ SOLN
INTRAMUSCULAR | Status: AC
  Filled 2017-10-05: qty 1

## 2017-10-05 MED ORDER — ACETAMINOPHEN 325 MG PO TABS
650.0000 mg | ORAL_TABLET | ORAL | Status: DC | PRN
  Administered 2017-10-07 – 2017-10-13 (×2): 650 mg via ORAL
  Filled 2017-10-05 (×3): qty 2

## 2017-10-05 MED ORDER — ATORVASTATIN CALCIUM 20 MG PO TABS: 20.0000 mg | ORAL_TABLET | Freq: Every day | ORAL | Status: DC

## 2017-10-05 MED ORDER — ACETAMINOPHEN 650 MG RE SUPP: 650.0000 mg | RECTAL | Status: DC | PRN

## 2017-10-05 MED ORDER — ONDANSETRON HCL 4 MG/2ML IJ SOLN
4.0000 mg | Freq: Once | INTRAMUSCULAR | Status: AC
  Administered 2017-10-05: 4 mg via INTRAVENOUS

## 2017-10-05 MED ORDER — ATORVASTATIN CALCIUM 10 MG PO TABS
10.0000 mg | ORAL_TABLET | Freq: Every day | ORAL | Status: DC
  Administered 2017-10-07 – 2017-10-14 (×8): 10 mg via ORAL
  Filled 2017-10-05 (×9): qty 1

## 2017-10-05 MED ORDER — STROKE: EARLY STAGES OF RECOVERY BOOK
Freq: Once | Status: AC
  Administered 2017-10-05: 18:00:00
  Filled 2017-10-05: qty 1

## 2017-10-05 MED ORDER — ASPIRIN EC 81 MG PO TBEC: 81.0000 mg | DELAYED_RELEASE_TABLET | Freq: Every day | ORAL | Status: DC

## 2017-10-05 MED ORDER — ENOXAPARIN SODIUM 40 MG/0.4ML ~~LOC~~ SOLN
40.0000 mg | SUBCUTANEOUS | Status: DC
  Administered 2017-10-05 – 2017-10-14 (×10): 40 mg via SUBCUTANEOUS
  Filled 2017-10-05 (×10): qty 0.4

## 2017-10-05 MED ORDER — SODIUM CHLORIDE 0.9 % IV SOLN
1500.0000 mg | INTRAVENOUS | Status: AC
  Administered 2017-10-05: 1500 mg via INTRAVENOUS
  Filled 2017-10-05: qty 30

## 2017-10-05 MED ORDER — ASPIRIN 81 MG PO CHEW
324.0000 mg | CHEWABLE_TABLET | Freq: Once | ORAL | Status: AC
Start: 2017-10-05 — End: 2017-10-05
  Administered 2017-10-05: 324 mg via ORAL
  Filled 2017-10-05: qty 4

## 2017-10-05 MED ORDER — ACETAMINOPHEN 160 MG/5ML PO SOLN
650.0000 mg | ORAL | Status: DC | PRN
  Administered 2017-10-08: 650 mg
  Filled 2017-10-05: qty 20.3

## 2017-10-05 MED ORDER — SODIUM CHLORIDE 0.9 % IV SOLN
2000.0000 mg | Freq: Two times a day (BID) | INTRAVENOUS | Status: DC
  Administered 2017-10-05 – 2017-10-15 (×19): 2000 mg via INTRAVENOUS
  Filled 2017-10-05 (×20): qty 20

## 2017-10-05 MED ORDER — LEVETIRACETAM 500 MG PO TABS: 2000.0000 mg | ORAL_TABLET | Freq: Two times a day (BID) | ORAL | Status: DC

## 2017-10-05 MED ORDER — IOPAMIDOL (ISOVUE-370) INJECTION 76%
INTRAVENOUS | Status: AC
  Administered 2017-10-05: 90 mL
  Filled 2017-10-05: qty 100

## 2017-10-05 MED ORDER — SENNOSIDES-DOCUSATE SODIUM 8.6-50 MG PO TABS
1.0000 | ORAL_TABLET | Freq: Every evening | ORAL | Status: DC | PRN
  Administered 2017-10-09: 1 via ORAL
  Filled 2017-10-05: qty 1

## 2017-10-05 NOTE — ED Notes (Signed)
Another 1 mg ativan given to patient IV per neurology verbal order

## 2017-10-05 NOTE — Progress Notes (Signed)
Received patient from ED via stretcher; family at bedside; oriented to room and unit routine; patient is alert with slurred speech. Fall safety provided; bed low and locked.

## 2017-10-05 NOTE — ED Notes (Signed)
Tele Neuro at bedside 

## 2017-10-05 NOTE — ED Provider Notes (Signed)
[facility] EMERGENCY DEPARTMENT Provider Note   CSN: [id number] Arrival date & time: 10/05/17  1228     History   Chief Complaint Chief Complaint  Patient presents with  . Aphasia    HPI Kathleen Jensen is a 67 y.o. female.  Patient is a 67 year old female with a history of diabetes, gastric bypass, hypertension, prior stroke, chronic kidney disease presenting today with expressive aphasia.  Patient was normal when she woke up at 730 this morning per family.  They were with her all morning and then around 1030 she started having difficulty with speech.  Nobody witnessed any evidence of seizure-like activity.  She is otherwise been awake but did have some nausea.  Her speech has not improved so they brought her here for further evaluation.  Patient is denying any chest pain or shortness of breath.  Family states she has chronic right-sided weakness but it seems to be about the same as normal.  She does not take aspirin or any type of anticoagulation.  She does take blood pressure medications daily.     The history is provided by a relative.    Past Medical History:  Diagnosis Date  . Arthritis   . 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 Hypertension     Patient Active Problem List   Diagnosis Date Noted  . Hyperlipidemia 09/22/2017  . Urinary frequency 09/22/2017  . Pre-operative clearance 11/19/2016  . Airway hyperreactivity 01/26/2016  . Disease of thyroid gland 01/26/2016  . 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 Other specified postprocedural states 01/26/2016  . 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 Chronic LBP 04/10/2015  . Degeneration of intervertebral disc of lumbar region 04/10/2015  . Spondylolisthesis of lumbar region 04/10/2015  . 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  . Chronic kidney disease (CKD), stage III (moderate) (HCC) 01/09/2015  . History of cardiac pacemaker in situ 01/09/2015  . B12  DEFICIENCY 08/15/2008  . UNSPECIFIED VITAMIN D DEFICIENCY 08/15/2008  . SINUS BRADYCARDIA 08/15/2008  . OBSTRUCTIVE SLEEP APNEA 08/11/2008  . INSOMNIA 04/28/2008  . Hyperlipidemia LDL goal <100 12/12/2006  . RHINITIS, ALLERGIC NEC 12/12/2006  . Hypothyroidism 12/11/2006  . OBESITY, MORBID 12/11/2006  . Essential hypertension 12/11/2006  . GERD 12/11/2006  . STRESS INCONTINENCE 12/11/2006    Past Surgical History:  Procedure Laterality Date  . BACK SURGERY     Dec 18, 2016 Dr.Torrealba  . BREAST BIOPSY Right   . BREAST EXCISIONAL BIOPSY    . CERVICAL SPINE SURGERY     C4  . CESAREAN SECTION    . GASTRIC BYPASS  03/2007  . TONSILLECTOMY      OB History    No data available       Home Medications    Prior to Admission medications   Medication Sig Start Date End Date Taking? Authorizing Provider  acetaminophen (TYLENOL) 325 MG tablet Take 1 tablet by mouth every 8 (eight) hours as needed.    [provider]  amLODipine (NORVASC) 10 MG tablet TAKE 1 TABLET EVERY DAY 02/18/17   Zola Button, Grayling Congress, DO  aspirin 81 MG tablet Take 81 mg by mouth daily.    [provider]  calcium carbonate (OS-CAL - DOSED IN MG OF ELEMENTAL CALCIUM) 1250 (500 Ca) MG tablet 1 tablet daily.    [provider]  Cholecalciferol (VITAMIN D3) 1000 units CAPS Take by mouth.    [provider]  ferrous sulfate 325 (65 FE)  MG tablet Take 1 tablet by mouth daily with breakfast.    [provider]  levETIRAcetam (KEPPRA) 500 MG tablet Take 4 tablets (2,000 mg total) by mouth 2 (two) times daily. Patient taking differently: Take 500 mg by mouth 2 (two) times daily.  09/26/17   Donato Schultz, DO  levothyroxine (SYNTHROID) 88 MCG tablet Take 1 tablet (88 mcg total) by mouth daily before breakfast. 09/23/17   Zola Button, Grayling Congress, DO  levothyroxine (SYNTHROID, LEVOTHROID) 100 MCG tablet TAKE 1 TABLET EVERY DAY 05/02/17   Zola Button, Grayling Congress, DO  losartan  (COZAAR) 50 MG tablet Take 50 mg by mouth daily.    [provider]  LYRICA 50 MG capsule  05/16/17   [provider]  mirtazapine (REMERON) 15 MG tablet Take 1 tablet by mouth at bedtime.    [provider]  pantoprazole (PROTONIX) 40 MG tablet Take 1 tablet by mouth daily. 09/05/17   [provider]  polyethylene glycol (MIRALAX / GLYCOLAX) packet Take 17 g by mouth every other day.    [provider]  senna-docusate (SENOKOT-S) 8.6-50 MG tablet Take 1 tablet by mouth 2 (two) times daily.    [provider]  traMADol (ULTRAM) 50 MG tablet Take 100 mg by mouth 2 (two) times daily as needed. 03/14/16   [provider]    Family History Family History  Problem Relation Age of Onset  . High blood pressure Mother   . Diabetes Mother   . Diabetes Father   . Stroke Father   . Cancer Sister        Colorectal cancer  . Cancer Brother        colon    Social History Social History   Tobacco Use  . Smoking status: Former Smoker    Years: 4.00    Last attempt to quit: 07/30/1979    Years since quitting: 38.2  . Smokeless tobacco: Never Used  Substance Use Topics  . Alcohol use: No    Alcohol/week: 0.0 oz  . Drug use: No     Allergies   Tape; Meperidine hcl; Sulfonamide derivatives; Bacitracin-polymyxin b; and Oxycodone   Review of Systems Review of Systems  All other systems reviewed and are negative.    Physical Exam Updated Vital Signs BP (!) 165/90   Pulse 79   Resp (!) 26   SpO2 98%   Physical Exam  Constitutional: She is oriented to person, place, and time. She appears well-developed and well-nourished. No distress.  HENT:  Head: Normocephalic and atraumatic.  Mouth/Throat: Oropharynx is clear and moist.  Eyes: Conjunctivae and EOM are normal. Pupils are equal, round, and reactive to light.  Neck: Normal range of motion. Neck supple.  Cardiovascular: Normal rate, regular rhythm and intact distal pulses.    No murmur heard. Pulmonary/Chest: Effort normal and breath sounds normal. No respiratory distress. She has no wheezes. She has no rales.  Abdominal: Soft. She exhibits no distension. There is no tenderness. There is no rebound and no guarding.  Musculoskeletal: Normal range of motion. She exhibits no edema or tenderness.  Neurological: She is alert and oriented to person, place, and time.  Expressive aphasia.  3 out of 5 strength in the right upper and lower extremity.  5 out of 5 strength in the left upper and lower extremity which is chronic.  Symmetric smile and eyebrow raise.  Patient can follow commands and has no evidence of neglect.  Skin: Skin is warm  and dry. No rash noted. No erythema.  Psychiatric: She has a normal mood and affect. Her behavior is normal.  Nursing note and vitals reviewed.    ED Treatments / Results  Labs (all labs ordered are listed, but only abnormal results are displayed) Labs Reviewed  PROTIME-INR  APTT  CBC  DIFFERENTIAL  COMPREHENSIVE METABOLIC PANEL  TROPONIN I  CBG MONITORING, ED    EKG  EKG Interpretation  Date/Time:  Sunday October 05 2017 13:08:51 EDT Ventricular Rate:  73 PR Interval:    QRS Duration: 93 QT Interval:  532 QTC Calculation: 587 R Axis:   -17 Text Interpretation:  Sinus rhythm Multiple premature complexes, vent & supraven Abnormal R-wave progression, early transition Left ventricular hypertrophy Anterior Q waves, possibly due to LVH Prolonged QT interval Baseline wander in lead(s) I III aVR aVL V1 No previous tracing Confirmed by Gwyneth Sprout ([id number]) on 10/05/2017 1:12:11 PM       Radiology Ct Head Code Stroke Wo Contrast  Result Date: 10/05/2017 CLINICAL DATA:  Code stroke. Slurred speech. Prior stroke with residual right-sided deficits. EXAM: CT HEAD WITHOUT CONTRAST TECHNIQUE: Contiguous axial images were obtained from the base of the skull through the vertex without intravenous contrast. COMPARISON:  01/31/2005  FINDINGS: Brain: There is no evidence of acute infarct, intracranial hemorrhage, mass, midline shift, or extra-axial fluid collection. There is a chronic left frontoparietal cortical and subcortical infarct near the vertex which is new from the prior study. Mild lateral and third ventriculomegaly is new and felt to reflect central predominant cerebral atrophy rather than hydrocephalus. Periventricular white matter hypodensities are nonspecific but compatible with mild chronic small vessel ischemic disease. A chronic subcentimeter infarct in the left cerebellum is new. Vascular: Calcified atherosclerosis at the skull base. No hyperdense vessel. Skull: No fracture focal osseous lesion. Sinuses/Orbits: Tiny left maxillary sinus mucous retention cyst. Clear mastoid air cells. Unremarkable orbits. Other: None. ASPECTS [facility] Stroke Program Early CT Score) - Ganglionic level infarction (caudate, lentiform nuclei, internal capsule, insula, M1-M3 cortex): 7 - Supraganglionic infarction (M4-M6 cortex): 3 Total score (0-10 with 10 being normal): 10 IMPRESSION: 1. No evidence of acute intracranial abnormality. 2. ASPECTS is 10. 3. 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 4. Mild chronic small vessel ischemic disease and cerebral atrophy. These results were called by telephone at the time of interpretation on 10/05/2017 at 12:49 pm to Dr. Gwyneth Sprout , who verbally acknowledged these results. Electronically Signed   By: Sebastian Ache M.D.   On: 10/05/2017 12:50    Procedures Procedures (including critical care time)  Medications Ordered in ED Medications  aspirin chewable tablet 324 mg (not administered)  ondansetron (ZOFRAN) injection 4 mg (4 mg Intravenous Given 10/05/17 1300)     Initial Impression / Assessment and Plan / ED Course  I have reviewed the triage vital signs and the nursing notes.  Pertinent labs & imaging results that were available during my care of the patient  were reviewed by me and considered in my medical decision making (see chart for details).     Patient arriving with multiple medical problems and expressive aphasia today concerning for a stroke.  Last seen normal was at 1030-1045 she was approximately 2 hours prior to arrival.  Patient airway was intact and she went directly to CT.  CT was negative for acute findings.  NIH of 14. Tele-psych was notified and assessed soon as patient returned from CT.  Patient continues to be aphasic.  Because of prior stroke  in the past and possible bleed when she was in [place] they did not want to initiate TPA unless she had further imaging and possible catheter guided TPA.  Also patient does have a history of seizure which family states has caused a aphasia in the past.  Nobody noticed any seizure-like activity prior to this and patient is taking her Keppra regularly.  However due to patient's prior history concern that this may be a mimic.  EKG without acute findings.  BS wnl. Telemetry neurology recommended 325 mg of aspirin and transfer to come for further evaluation.  [facility]  CRITICAL CARE Performed by: Len Azeez Total critical care time: 30 minutes Critical care time was exclusive of separately billable procedures and treating other patients. Critical care was necessary to treat or prevent imminent or life-threatening deterioration. Critical care was time spent personally by me on the following activities: development of treatment plan with patient and/or surrogate as well as nursing, discussions with consultants, evaluation of patient's response to treatment, examination of patient, obtaining history from patient or surrogate, ordering and performing treatments and interventions, ordering and review of laboratory studies, ordering and review of radiographic studies, pulse oximetry and re-evaluation of patient's condition.   Final Clinical  Impressions(s) / ED Diagnoses   Final diagnoses:  Expressive aphasia    ED Discharge Orders    None       Gwyneth SproutPlunkett, Tanis Hensarling, MD 10/05/17 1410
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
## 2017-10-05 NOTE — Consult Note (Signed)
NEUROHOSPITALISTS - STROKE CONSULTATION NOTE   Requesting Physician: Dr. Jola Schmidt [facility]: Dr. Roland Rack  Admit date: 10/05/2017    Chief Complaint: Code stroke for acute onset aphasia  History obtained from:   Patient's family, EMS and Chart     HPI   Kathleen Jensen is an 67 y.o. female  HTN, HLD, DM, Hx of CVA's and Seizure disorder on Keppra that presents to the ED with acute onset expressive/receptive aphasia. Per family her onset of symptoms was witnessed by family members this morning at 10:45 AM. Patient was taken to [facility], the initial Head CT was negative for acute findings. She was then transported to [facility] for further stroke work-up. She present to the ED with Aphasia and right sided weakness.  Per EMR patient had a similar presentation at [facility] in January 2019 and her symptoms were diagnosed as status epilepticus and not an acute stroke.  Per daughter, the patient has been taking her correct dose of Keppra 2,000 mg BID. However, her [facility] Rx changed from 1,032m tablets to 500 mg tablets. 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 Arthritis   . Diabetes mellitus without complication ([facility]    resolved after gastric bypass  . Hypertension    Past Surgical History   Past Surgical History:  Procedure Laterality Date  . BACK SURGERY     Dec 18, 2016 Dr.Torrealba  . BREAST BIOPSY Right   . BREAST EXCISIONAL BIOPSY    . CERVICAL SPINE SURGERY     C4  . CESAREAN SECTION    .  GASTRIC BYPASS  03/2007  . TONSILLECTOMY      Family History   Family History  Problem Relation Age of Onset  . High blood pressure Mother   . Diabetes Mother   . Diabetes Father   . Stroke Father   . Cancer Sister        Colorectal cancer  . Cancer Brother        colon    Social History   reports that she quit smoking about 38 years ago. She quit after 4.00 years of use. she has never used smokeless tobacco. She reports that she does not drink alcohol or use drugs.  Allergies   Allergies  Allergen Reactions  . Tape Rash  . Meperidine Hcl Nausea And Vomiting  . Sulfonamide Derivatives Itching  . Bacitracin-Polymyxin B Dermatitis    "Cloth Band-Aid only"  . Oxycodone Itching    Medications Prior to Admission   No current facility-administered medications on file prior to encounter.    Current Outpatient Medications on File Prior to Encounter  Medication Sig Dispense Refill  . docusate sodium (STOOL SOFTENER) 100 MG capsule Take 100 mg by mouth 2 (two) times daily.    . Melatonin 10 MG TABS Take 2 tablets by mouth once.    . Multiple Vitamin (MULTIVITAMIN) capsule Take 1 capsule by mouth daily.    .Marland Kitchenacetaminophen (TYLENOL) 325 MG tablet Take 1 tablet by mouth every 8 (eight) hours as needed.    .Marland KitchenamLODipine (NORVASC) 10 MG tablet TAKE 1  TABLET EVERY DAY 90 tablet 1  . aspirin 81 MG tablet Take 81 mg by mouth daily.    . calcium carbonate (OS-CAL - DOSED IN MG OF ELEMENTAL CALCIUM) 1250 (500 Ca) MG tablet 1 tablet daily.    . Cholecalciferol (VITAMIN D3) 1000 units CAPS Take by mouth.    . ferrous sulfate 325 (65 FE) MG tablet Take 1 tablet by mouth daily with breakfast.    . levETIRAcetam (KEPPRA) 500 MG tablet Take 4 tablets (2,000 mg total) by mouth 2 (two) times daily. (Patient taking differently: Take 500 mg by mouth 2 (two) times daily. ) 240 tablet 0  . levothyroxine (SYNTHROID) 88 MCG tablet Take 1 tablet (88 mcg total) by mouth daily before breakfast. 30 tablet 2   . levothyroxine (SYNTHROID, LEVOTHROID) 100 MCG tablet TAKE 1 TABLET EVERY DAY 90 tablet 0  . losartan (COZAAR) 50 MG tablet Take 50 mg by mouth daily.    Marland Kitchen LYRICA 50 MG capsule     . mirtazapine (REMERON) 15 MG tablet Take 1 tablet by mouth at bedtime.    . pantoprazole (PROTONIX) 40 MG tablet Take 1 tablet by mouth daily.    . polyethylene glycol (MIRALAX / GLYCOLAX) packet Take 17 g by mouth every other day.    . senna-docusate (SENOKOT-S) 8.6-50 MG tablet Take 1 tablet by mouth 2 (two) times daily.    . traMADol (ULTRAM) 50 MG tablet Take 100 mg by mouth 2 (two) times daily as needed.      ROS  History obtained from unobtainable from patient due to aphasia Patient does not appear to be in pain, No SOB, N/V, does not appear to have a H/A or CP. No active skin lesions or wounds noted. Per family no recent illness or travel  Physical Examination   Vitals:   10/05/17 1432 10/05/17 1433 10/05/17 1439 10/05/17 1445  BP: (!) 143/82   (!) 158/92  Pulse: 78 78  77  Resp: 16   16  Temp:   98 F (36.7 C)   TempSrc:   Oral   SpO2: 96% 96%  97%   HEENT-  Normocephalic,  Cardiovascular - Regular rate and rhythm  Respiratory - Lungs clear bilaterally. Non-labored breathing, No wheezing. Abdomen - soft and non-tender, BS normal Extremities- no edema or cyanosis Skin-warm and dry  Neurological Examination  Mental Status: Awake, not oriented and not following any direct commands. Will intermittently mimic but not consistently. Speech fluent but aphasic. No true neglect noted.  Not able to follow 3 step commands. Cranial Nerves: II: Visual Fields appear full. Pupils are equal, round, and reactive to light.   III,IV, VI: EOMI without ptosis. No nystagmus.   V: Facial sensation appears symmetric to touch VII: Facial movement is symmetric.  VIII: hearing is intact to voice X: Uvula appears to elevate symmetrically XI: Shoulder shrug is weaker on right. XII: tongue appear midline without  atrophy or fasciculations.  Motor: Tone is normal. Bulk is normal. 5/5 strength on left. 4/5 on right- baseline deficit Sensor: Sensation appears symmetric to light touch throughout Deep Tendon Reflexes: 2+ and symmetric throughout in the biceps and patellae Plantars: Toes are downgoing bilaterally.  Cerebellar: unable to perform Gait: not tested. At baseline does not ambulate  Laboratory Results  CBC: Recent Labs  Lab 10/05/17 1250  WBC 7.8  HGB 12.9  HCT 38.3  MCV 85.9  PLT 937   Basic Metabolic Panel: Recent Labs  Lab 10/05/17 1250  NA 139  K 3.4*  CL 105  CO2 23  GLUCOSE 99  BUN 12  CREATININE 0.75  CALCIUM 9.6   Cardiac Enzymes: Recent Labs  Lab 10/05/17 1250  TROPONINI <0.03   Coagulation Studies: Recent Labs    10/05/17 1250  APTT 29  INR 1.01    Imaging Results  Ct Angio Head W Or Wo Contrast  Result Date: 10/05/2017 CLINICAL DATA:  Slurred speech.  Prior stroke. EXAM: CT ANGIOGRAPHY HEAD AND NECK CT PERFUSION BRAIN TECHNIQUE: Multidetector CT imaging of the head and neck was performed using the standard protocol during bolus administration of intravenous contrast. Multiplanar CT image reconstructions and MIPs were obtained to evaluate the vascular anatomy. Carotid stenosis measurements (when applicable) are obtained utilizing NASCET criteria, using the distal internal carotid diameter as the denominator. Multiphase CT imaging of the brain was performed following IV bolus contrast injection. Subsequent parametric perfusion maps were calculated using RAPID software. CONTRAST:  5m ISOVUE-370 IOPAMIDOL (ISOVUE-370) INJECTION 76% COMPARISON:  None. FINDINGS: CTA NECK FINDINGS Aortic arch: Normal variant aortic arch branching pattern with common origin of brachiocephalic and left common carotid arteries. Mild arch atherosclerosis. Widely patent arch vessel origins. Right carotid system: Patent with mild calcified and soft plaque at the carotid bifurcation. No  significant stenosis or evidence of dissection. Left carotid system: Patent with minimal calcified plaque at the carotid bifurcation. No stenosis or evidence of dissection. Vertebral arteries: Patent and codominant without evidence of stenosis or dissection. Skeleton: Remote, solid interbody osseous fusion at C4-5. Bilateral C4-5 facet ankylosis. Multilevel disc degeneration, advanced at C5-6 and C6-7. Bulky posterior spurring/disc osteophyte complex at C5-6 results in moderate spinal stenosis. Other neck: No mass or enlarged lymph nodes. Upper chest: Clear lung apices. Review of the MIP images confirms the above findings CTA HEAD FINDINGS Anterior circulation: The internal carotid arteries are patent from skull base to carotid termini with mild nonstenotic siphon atherosclerosis bilaterally. ACAs and MCAs are patent without evidence of proximal branch occlusion or significant proximal stenosis. No aneurysm or vascular malformation. Posterior circulation: The intracranial vertebral arteries are widely patent to the basilar. PICAs and SCAs are unremarkable. AICAs are small and not well evaluated. The basilar artery is widely patent. There is a small right posterior communicating artery. PCAs are patent without evidence of significant stenosis. No aneurysm or vascular malformation. Venous sinuses: As permitted by contrast timing, patent. Anatomic variants: None. Delayed phase: Not performed. Review of the MIP images confirms the above findings CT Brain Perfusion Findings: CBF (<30%) Volume: 076mPerfusion (Tmax>6.0s) volume: 6m35mismatch Volume: n/a Infarction Location: n/a There is increased cerebral blood volume and blood flow diffusely throughout cortex in the posterior left temporal, left occipital, and left parietal lobes in the setting of an old high left parietal cortical infarct. IMPRESSION: 1. No large vessel occlusion. 2. Mild intracranial and cervical atherosclerosis without significant stenosis or aneurysm.  3. No evidence of acute ischemia on perfusion imaging. 4. Increased cortical blood flow throughout the posterior left cerebral hemisphere, query seizure activity in the setting of an old left parietal cortical infarct. These results were called by telephone at the time of interpretation on 10/05/2017 at 2:43 pm to Dr. MCNRoland Rackwho verbally acknowledged these results. Electronically Signed   By: AllLogan BoresD.   On: 10/05/2017 14:58   Ct Angio Neck W Or Wo Contrast  Result Date: 10/05/2017 CLINICAL DATA:  Slurred speech.  Prior stroke. EXAM: CT ANGIOGRAPHY HEAD AND NECK CT PERFUSION BRAIN TECHNIQUE: Multidetector CT imaging  of the head and neck was performed using the standard protocol during bolus administration of intravenous contrast. Multiplanar CT image reconstructions and MIPs were obtained to evaluate the vascular anatomy. Carotid stenosis measurements (when applicable) are obtained utilizing NASCET criteria, using the distal internal carotid diameter as the denominator. Multiphase CT imaging of the brain was performed following IV bolus contrast injection. Subsequent parametric perfusion maps were calculated using RAPID software. CONTRAST:  342m ISOVUE-370 IOPAMIDOL (ISOVUE-370) INJECTION 76% COMPARISON:  None. FINDINGS: CTA NECK FINDINGS Aortic arch: Normal variant aortic arch branching pattern with common origin of brachiocephalic and left common carotid arteries. Mild arch atherosclerosis. Widely patent arch vessel origins. Right carotid system: Patent with mild calcified and soft plaque at the carotid bifurcation. No significant stenosis or evidence of dissection. Left carotid system: Patent with minimal calcified plaque at the carotid bifurcation. No stenosis or evidence of dissection. Vertebral arteries: Patent and codominant without evidence of stenosis or dissection. Skeleton: Remote, solid interbody osseous fusion at C4-5. Bilateral C4-5 facet ankylosis. Multilevel disc  degeneration, advanced at C5-6 and C6-7. Bulky posterior spurring/disc osteophyte complex at C5-6 results in moderate spinal stenosis. Other neck: No mass or enlarged lymph nodes. Upper chest: Clear lung apices. Review of the MIP images confirms the above findings CTA HEAD FINDINGS Anterior circulation: The internal carotid arteries are patent from skull base to carotid termini with mild nonstenotic siphon atherosclerosis bilaterally. ACAs and MCAs are patent without evidence of proximal branch occlusion or significant proximal stenosis. No aneurysm or vascular malformation. Posterior circulation: The intracranial vertebral arteries are widely patent to the basilar. PICAs and SCAs are unremarkable. AICAs are small and not well evaluated. The basilar artery is widely patent. There is a small right posterior communicating artery. PCAs are patent without evidence of significant stenosis. No aneurysm or vascular malformation. Venous sinuses: As permitted by contrast timing, patent. Anatomic variants: None. Delayed phase: Not performed. Review of the MIP images confirms the above findings CT Brain Perfusion Findings: CBF (<30%) Volume: 0342mPerfusion (Tmax>6.0s) volume: 42m15mismatch Volume: n/a Infarction Location: n/a There is increased cerebral blood volume and blood flow diffusely throughout cortex in the posterior left temporal, left occipital, and left parietal lobes in the setting of an old high left parietal cortical infarct. IMPRESSION: 1. No large vessel occlusion. 2. Mild intracranial and cervical atherosclerosis without significant stenosis or aneurysm. 3. No evidence of acute ischemia on perfusion imaging. 4. Increased cortical blood flow throughout the posterior left cerebral hemisphere, query seizure activity in the setting of an old left parietal cortical infarct. These results were called by telephone at the time of interpretation on 10/05/2017 at 2:43 pm to Dr. MCNRoland Rackwho verbally  acknowledged these results. Electronically Signed   By: AllLogan BoresD.   On: 10/05/2017 14:58   Ct Cerebral Perfusion W Contrast  Result Date: 10/05/2017 CLINICAL DATA:  Slurred speech.  Prior stroke. EXAM: CT ANGIOGRAPHY HEAD AND NECK CT PERFUSION BRAIN TECHNIQUE: Multidetector CT imaging of the head and neck was performed using the standard protocol during bolus administration of intravenous contrast. Multiplanar CT image reconstructions and MIPs were obtained to evaluate the vascular anatomy. Carotid stenosis measurements (when applicable) are obtained utilizing NASCET criteria, using the distal internal carotid diameter as the denominator. Multiphase CT imaging of the brain was performed following IV bolus contrast injection. Subsequent parametric perfusion maps were calculated using RAPID software. CONTRAST:  942m74mOVUE-370 IOPAMIDOL (ISOVUE-370) INJECTION 76% COMPARISON:  None. FINDINGS: CTA NECK FINDINGS Aortic arch: Normal variant  aortic arch branching pattern with common origin of brachiocephalic and left common carotid arteries. Mild arch atherosclerosis. Widely patent arch vessel origins. Right carotid system: Patent with mild calcified and soft plaque at the carotid bifurcation. No significant stenosis or evidence of dissection. Left carotid system: Patent with minimal calcified plaque at the carotid bifurcation. No stenosis or evidence of dissection. Vertebral arteries: Patent and codominant without evidence of stenosis or dissection. Skeleton: Remote, solid interbody osseous fusion at C4-5. Bilateral C4-5 facet ankylosis. Multilevel disc degeneration, advanced at C5-6 and C6-7. Bulky posterior spurring/disc osteophyte complex at C5-6 results in moderate spinal stenosis. Other neck: No mass or enlarged lymph nodes. Upper chest: Clear lung apices. Review of the MIP images confirms the above findings CTA HEAD FINDINGS Anterior circulation: The internal carotid arteries are patent from skull base  to carotid termini with mild nonstenotic siphon atherosclerosis bilaterally. ACAs and MCAs are patent without evidence of proximal branch occlusion or significant proximal stenosis. No aneurysm or vascular malformation. Posterior circulation: The intracranial vertebral arteries are widely patent to the basilar. PICAs and SCAs are unremarkable. AICAs are small and not well evaluated. The basilar artery is widely patent. There is a small right posterior communicating artery. PCAs are patent without evidence of significant stenosis. No aneurysm or vascular malformation. Venous sinuses: As permitted by contrast timing, patent. Anatomic variants: None. Delayed phase: Not performed. Review of the MIP images confirms the above findings CT Brain Perfusion Findings: CBF (<30%) Volume: 52m Perfusion (Tmax>6.0s) volume: 073mMismatch Volume: n/a Infarction Location: n/a There is increased cerebral blood volume and blood flow diffusely throughout cortex in the posterior left temporal, left occipital, and left parietal lobes in the setting of an old high left parietal cortical infarct. IMPRESSION: 1. No large vessel occlusion. 2. Mild intracranial and cervical atherosclerosis without significant stenosis or aneurysm. 3. No evidence of acute ischemia on perfusion imaging. 4. Increased cortical blood flow throughout the posterior left cerebral hemisphere, query seizure activity in the setting of an old left parietal cortical infarct. These results were called by telephone at the time of interpretation on 10/05/2017 at 2:43 pm to Dr. MCRoland Rack who verbally acknowledged these results. Electronically Signed   By: AlLogan Bores.D.   On: 10/05/2017 14:58   Ct Head Code Stroke Wo Contrast  Result Date: 10/05/2017 CLINICAL DATA:  Code stroke. Slurred speech. Prior stroke with residual right-sided deficits. EXAM: CT HEAD WITHOUT CONTRAST TECHNIQUE: Contiguous axial images were obtained from the base of the skull through the  vertex without intravenous contrast. COMPARISON:  01/31/2005 FINDINGS: Brain: There is no evidence of acute infarct, intracranial hemorrhage, mass, midline shift, or extra-axial fluid collection. There is a chronic left frontoparietal cortical and subcortical infarct near the vertex which is new from the prior study. Mild lateral and third ventriculomegaly is new and felt to reflect central predominant cerebral atrophy rather than hydrocephalus. Periventricular white matter hypodensities are nonspecific but compatible with mild chronic small vessel ischemic disease. A chronic subcentimeter infarct in the left cerebellum is new. Vascular: Calcified atherosclerosis at the skull base. No hyperdense vessel. Skull: No fracture focal osseous lesion. Sinuses/Orbits: Tiny left maxillary sinus mucous retention cyst. Clear mastoid air cells. Unremarkable orbits. Other: None. ASPECTS ([facility] Early CT Score) - Ganglionic level infarction (caudate, lentiform nuclei, internal capsule, insula, M1-M3 cortex): 7 - Supraganglionic infarction (M4-M6 cortex): 3 Total score (0-10 with 10 being normal): 10 IMPRESSION: 1. No evidence of acute intracranial abnormality. 2. ASPECTS is 10. 3. Interval  chronic left frontoparietal and left cerebellar infarcts. 4. Mild chronic small vessel ischemic disease and cerebral atrophy. These results were called by telephone at the time of interpretation on 10/05/2017 at 12:49 pm to Dr. Blanchie Dessert , who verbally acknowledged these results. Electronically Signed   By: Logan Bores M.D.   On: 10/05/2017 12:50   ECHO 12/11/2016 Mild concentric LV hypertrophy with normal LV systolic function EF 73-71%, Mild MAC with Mild MR. Pacer wires  IMPRESSION AND PLAN  Ms. Arra Connaughton is a 67 y.o. female with PMH of HTN, HLD, DM, Hx of CVA's and Seizure disorder on Keppra that presents to the ED with acute onset expressive and receptive aphasia. CTA negative for acute findings  Old Left  parietal cortical infarct Possible New onset AFIB Possible Status Epilepticus  Suspected Etiology: Possibly recrudescence from previous stroke but more likely seizure activity. Resultant Symptoms: Expressive aphashia Stroke Risk Factors: hyperlipidemia and hypertension, DM, ?New onset AFIB Other Stroke Risk Factors: Advanced age, Hx CVA  Outstanding Stroke/Seizure Work-up Studies:     EEG:                                                                      PENDING  PLAN  10/05/2017  Patient will be admitted to the hospitalists No need to repeat ECHO at this time Frequent Neurochecks  Telemetry Monitoring NPO until passes Stroke swallow screen Start Aspirin/ Statin for stroke prevention HgbA1C and Lipid Profile PT/OT/SLP Consult PM & Rehab Consult Case Management /MSW Ongoing aggressive stroke risk factor management Patient will be counseled to be compliant with her antithrombotic medications Patient will be counseled on Lifestyle modifications including, Diet, Exercise, and Stress Follow up with [facility] in 6 weeks  NEUROLOGICAL  ISSUES  HX OF STROKES: 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 Does not walk independently. Requires wheelchair and lift  DYSPHAGIA: NPO until passes SLP swallow evaluation Aspiration Precautions in progress  SEIZURES: EEG - results pending No seizure activity reported per family, symptoms similar to presentation in January 2019 at [facility] and was diagnosed in status Continue Keppra at home dose Fosphenytoin IV x one dose Dilantin 100 mg IV every 8 hours Maintain Seizure precautions  MEDICAL ISSUES   R/O AFIB: Per EMS patient found to be in new onset AFIB,  EKG read by ED physician Dr Venora Maples does not show AFIB If A.FIB found on telemetry monitoring, the patient will need [facility]  therapy Consider formal Cardiology consultation  HYPERTENSION: Stable SBP goal less than 140/90  Long term BP goal normotensive. Home Meds: yes  HYPERLIPIDEMIA:    Component Value Date/Time   CHOL 140 09/22/2017 1221   TRIG 76.0 09/22/2017 1221   TRIG 93 05/28/2006 1119   HDL 49.40 09/22/2017 1221   CHOLHDL 3 09/22/2017 1221   VLDL 15.2 09/22/2017 1221   LDLCALC 75 09/22/2017 1221  Home Meds:  NONE LDL  goal < 70 Started on  Lipitor to 10 mg daily Continue statin at discharge  DIABETES: CURRENT HGA1C PENDING Lab Results  Component Value Date   HGBA1C 5.8 01/26/2016  HgbA1c goal < 7.0 Continue CBG monitoring and SSI to maintain glucose  140-180 mg/dl DM education     Hospital day # 0 VTE prophylaxis: SCD's  Diet - Diet NPO time specified     FAMILY UPDATES:  family at bedside  TEAM UPDATES:Campos, Lennette Bihari, MD STATUS:    Discharge Information  Prior Home Stroke Medications: No antithrombotic  Discharge Stroke Meds:  Please discharge patient on aspirin 81 mg daily     Disposition: Final discharge disposition not confirmed Therapy Recs:               PENDING  Follow up Appointments  Follow Up:  Ann Held, DO -PCP Follow up in 1-2 weeks      Assessment and plan discussed with with attending physician and they are in agreement.    Rain Wilhide Sella, ANP-C [facility] 10/05/2017, 3:17 PM  10/05/2017 ATTENDING ASSESSMENT    Please page stroke NP/ PA / MD from 8am -4 pm   You can look them up on [email or web address]  Password [email or web address]

## 2017-10-05 NOTE — ED Provider Notes (Signed)
Patient seen and evaluated by the stroke team on arrival to emergency department.  Remains a phasic.  Patient underwent CT angios head and neck as well as CT perfusion.  The current thought by neurology at this time as this may represent status epilepticus.  Stat EEG will be obtained now.  Additional recommendations to come from neurology.  Patient will be admitted to the stepdown unit.  IV Ativan given per neurology.  I personally reviewed the patient's CT images.   EKG Interpretation  Date/Time:  Sunday October 05 2017 13:08:51 EDT Ventricular Rate:  73 PR Interval:    QRS Duration: 93 QT Interval:  532 QTC Calculation: 587 R Axis:   -17 Text Interpretation:  Sinus rhythm Multiple premature complexes, vent & supraven Abnormal R-wave progression, early transition Left ventricular hypertrophy Anterior Q waves, possibly due to LVH Prolonged QT interval Baseline wander in lead(s) I III aVR aVL V1 No previous tracing Confirmed by Gwyneth SproutPlunkett, Whitney ([id number]) on 10/05/2017 1:12:11 PM         Azalia Bilisampos, Dashauna Heymann, MD 10/05/17 1517

## 2017-10-05 NOTE — ED Notes (Signed)
1 mg ativan given IV per neurology verbal order

## 2017-10-05 NOTE — Progress Notes (Signed)
EEG completed, results pending. 

## 2017-10-05 NOTE — ED Notes (Signed)
Report given to Jamie RN, charge nurse at [facility] ED 

## 2017-10-05 NOTE — Procedures (Signed)
History: 67 year old female with likely seizure and persistent aphasia  Sedation: Ativan given during the EEG  Technique: This is a 21 channel routine scalp EEG performed at the bedside with bipolar and monopolar montages arranged in accordance to the international 10/20 system of electrode placement. One channel was dedicated to EKG recording.    Background: There is a posterior dominant rhythm of 9 Hz that is seen bilaterally.  In addition, there is irregular delta most prominent at P7, 01 >T7.  In addition, there are moderately frequent sharp waves in the same distribution.  Photic stimulation: Physiologic driving is not performed  EEG Abnormalities: 1) left posterior quadrant sharp waves 2) left posterior quadrant slow activity  Clinical Interpretation: This EEG is consistent with an area of epileptogenic potential in the left posterior quadrant. There was no seizure recorded on this study.     Ritta SlotMcNeill Kirkpatrick, MD [facility] [phone]  If 7pm- 7am, please page neurology on call as listed in AMION.

## 2017-10-05 NOTE — ED Notes (Signed)
Patient transported to CT 

## 2017-10-05 NOTE — Code Documentation (Signed)
67 year old presented to [facility] with stroke like symptoms.  Teleneurology was activated there - no tPA - advised [facility].  [facility]  She was alert - aphasic - words were repetitious of numbers only.  She would nod yes and no to questions but did not seem accurate.  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  Airway cleared at the bridge - she was taken to CT scan for CTA and CTP - some delay with IV access.  CTA and CTP showed no LVO and no signs ischemia.  Patient back to ED where she was started with EEG. Dr. Amada JupiterKirkpatrick at bedside.   Family in room report she had similar episode and [facility].  Granddaughter reports she saw the right eye twitching today.  No acute stroke treatment.  Handoff to [facility] RN.

## 2017-10-05 NOTE — Consult Note (Signed)
[facility] TeleNeurology Consult Services   DATE: October 05, 2017 Impression: Aphasia-possible ischemic stroke, seizure, recrudescence of old stroke symptoms from some other process-the patient has recurrent aphasia which she had with the prior seizure, however she wasn't witnessed to have any definite seizure activity by her family.  She is also had a prior stroke causing the symptoms.  Her family is a little unclear bowel kind of stroke she had in the past they were just told was from blood pressure and she had some sort of bleeding in the brain which is why she was not a TPA candidate.  [place].  Unclear if this was hemorrhagic conversion of an ischemic stroke or slowly a hemorrhagic stroke. Regardless she'll need a workup that cannot be performed in a freestanding emergency department she'll be transferred to: Health.  In the meantime her follow-up CT does not show any bleeding recommend a full strength aspirin and transferred for additional workup.  If on arrival to: [facility] she is still aphasic would recommend CT angios head and neck and perfusion.  If her symptoms have resolved this may not be necessary.  Regardless she would need a stroke/seizure/altered mental status workup.  Not a tpa candidate due to: History of ICH in some fashion according to the family with her recent stroke in November  Symptoms possibly consistent with LVO she has chronic right-sided weakness which doesn't appear changed however she has aphasia which she made a full recovery from since her stroke and has now returned recommendations for advanced neuro imaging as above if needed  Differential Diagnosis: New stroke, seizure, recrudescence of old stroke symptoms from some other process 1. Cardioembolic stroke 2. Small vessel disease/lacune 3. Thromboembolic, artery-to-artery mechanism 4. Hypercoagulable state-related infarct 5. Transient ischemic attack 6. Thrombotic mechanism, large artery  disease  Comments: Last known normal 10:45 Door time: 12:28 [facility] contacted: 12:41 [facility] at bedside: 12:45 NIHSS assessment time: 12:45  Recommendations: -Aspirin full strength -Transferred to [facility] for Inpatient Admission and Stroke/Seizure/Toxic Metabolic Workup As above -If she remains aphasic by the time of arrival would consider stat CT angios head and neck and perfusions to exclude large vessel occlusion  Inpatient neurology consultation Inpatient stroke evaluation as per Neurology/ Internal Medicine Discussed with ED MD Please call with questions ----------------------------------------------------------------------------------------- CC aphasia  History of Present Illness  Patient is a very pleasant 67 year old woman with a history of a stroke in November causing aphasia and right-sided weakness.  It sounds like she made a full recovery from the speech disturbance however she has some residual right-sided weakness.  The stroke was out of state and [place] and the family knows there was at least some sort of bleeding associated with the stroke.  It is not clear if it was ischemic with hemorrhagic transformation or solely a hypertensive bleed.  The patient was never put back on any antiplatelet medications and she remains off of them.  Blood pressure is moderately elevated in the emergency department.  Today she was with her family seemed to be acting her normal self and then sometime around 10:45 develop some slurred speech and aphasia.  She also had a seizure in January for which she is on Keppra but hasn't had any issues sense.  The symptoms with the seizure were aphasia.  There was no witnessed seizure activity on this occasion.  She did take her morning dose of Keppra.  They deny that she's had any systemic signs or symptoms such as fevers chills etc.  Diagnostic:  CT head without contrast shows encephalomalacia in the left frontal region no  bleed  Exam: 1a- LOC: Keenly responsive - =0     1b- LOC questions: Answers no questions correctly=2  1c- LOC commands- Performs 1 tasks=1    2- Gaze: Normal; no gaze paresis or gaze deviation - 0     3- Visual Fields: normal, no Visual field deficit - 0     4- Facial movements: no facial palsy - 0     5- Upper limb motor - right upper extremity drift=1   6- Lower limb motor - right lower extremity drift=1   7- Limb Coordination: absent ataxia - 0      8- Sensory : no sensory loss - 0      9- Language -aphasic and can follow some one-step commands=2   10- Speech - speech mildly slurred=1 11- Neglect / Extinction - none found -0     NIHSS score  8   Medical Decision Making: - Extensive number of diagnosis or management options are considered above. - Extensive amount of complex data reviewed. - High risk of complication and/or morbidity or mortality are associated with differential diagnostic considerations above. - There may be Uncertain outcome and increased probability of prolonged functional impairment or high probability of severe prolonged functional impairment associated with some of these differential diagnosis. Medical Data Reviewed: 1.Data reviewed include clinical labs, radiology, Medical Tests; 2.Tests results discussed w/performing or interpreting physician; 3.Obtaining/reviewing old medical records; 4.Obtaining case history from another source; 5.Independent review of image, tracing or specimen. Patient was informed the Neurology Consult would happen via telehealth (remote video) and consented to receiving care in this manner.

## 2017-10-05 NOTE — ED Triage Notes (Signed)
Per daughter, pt was normal when she woke up. Slurred speech started at 10;45 AM. Pt with previous stroke. R side deficits from that.

## 2017-10-06 DIAGNOSIS — I10 Essential (primary) hypertension: Secondary | ICD-10-CM

## 2017-10-06 DIAGNOSIS — E785 Hyperlipidemia, unspecified: Secondary | ICD-10-CM

## 2017-10-06 DIAGNOSIS — G4733 Obstructive sleep apnea (adult) (pediatric): Secondary | ICD-10-CM

## 2017-10-06 DIAGNOSIS — E039 Hypothyroidism, unspecified: Secondary | ICD-10-CM

## 2017-10-06 DIAGNOSIS — E119 Type 2 diabetes mellitus without complications: Secondary | ICD-10-CM

## 2017-10-06 DIAGNOSIS — N183 Chronic kidney disease, stage 3 (moderate): Secondary | ICD-10-CM

## 2017-10-06 DIAGNOSIS — E669 Obesity, unspecified: Secondary | ICD-10-CM

## 2017-10-06 LAB — LIPID PANEL
CHOL/HDL RATIO: 2.5 ratio
Cholesterol: 147 mg/dL (ref 0–200)
HDL: 58 mg/dL (ref >40)
LDL CALC: 76 mg/dL (ref 0–99)
TRIGLYCERIDES: 64 mg/dL (ref <150)
VLDL: 13 mg/dL (ref 0–40)

## 2017-10-06 LAB — PHENYTOIN LEVEL, TOTAL: Phenytoin Lvl: 18.1 ug/mL (ref 10.0–20.0)

## 2017-10-06 LAB — HEMOGLOBIN A1C
HEMOGLOBIN A1C: 5 % (ref 4.8–5.6)
Mean Plasma Glucose: 96.8 mg/dL

## 2017-10-06 MED ORDER — LACOSAMIDE 200 MG/20ML IV SOLN
200.0000 mg | Freq: Once | INTRAVENOUS | Status: AC
  Administered 2017-10-06: 200 mg via INTRAVENOUS
  Filled 2017-10-06: qty 20

## 2017-10-06 MED ORDER — FERROUS SULFATE 325 (65 FE) MG PO TABS
325.0000 mg | ORAL_TABLET | Freq: Every day | ORAL | Status: DC
  Administered 2017-10-06 – 2017-10-15 (×10): 325 mg via ORAL
  Filled 2017-10-06 (×10): qty 1

## 2017-10-06 MED ORDER — LEVOTHYROXINE SODIUM 100 MCG PO TABS
100.0000 ug | ORAL_TABLET | Freq: Every day | ORAL | Status: DC
  Administered 2017-10-06 – 2017-10-15 (×10): 100 ug via ORAL
  Filled 2017-10-06 (×10): qty 1

## 2017-10-06 MED ORDER — PREGABALIN 25 MG PO CAPS
25.0000 mg | ORAL_CAPSULE | Freq: Every day | ORAL | Status: DC
  Administered 2017-10-06 – 2017-10-15 (×10): 25 mg via ORAL
  Filled 2017-10-06 (×10): qty 1

## 2017-10-06 MED ORDER — VITAMIN D 1000 UNITS PO TABS
1000.0000 [IU] | ORAL_TABLET | Freq: Every morning | ORAL | Status: DC
  Administered 2017-10-06 – 2017-10-15 (×10): 1000 [IU] via ORAL
  Filled 2017-10-06 (×12): qty 1

## 2017-10-06 MED ORDER — SODIUM CHLORIDE 0.9 % IV SOLN
100.0000 mg | Freq: Two times a day (BID) | INTRAVENOUS | Status: DC
  Administered 2017-10-06 – 2017-10-08 (×4): 100 mg via INTRAVENOUS
  Filled 2017-10-06 (×7): qty 10

## 2017-10-06 MED ORDER — ASPIRIN EC 81 MG PO TBEC
81.0000 mg | DELAYED_RELEASE_TABLET | Freq: Every day | ORAL | Status: DC
  Administered 2017-10-06 – 2017-10-15 (×11): 81 mg via ORAL
  Filled 2017-10-06 (×12): qty 1

## 2017-10-06 MED ORDER — SENNOSIDES-DOCUSATE SODIUM 8.6-50 MG PO TABS
1.0000 | ORAL_TABLET | Freq: Two times a day (BID) | ORAL | Status: DC
  Administered 2017-10-06 – 2017-10-15 (×15): 1 via ORAL
  Filled 2017-10-06 (×16): qty 1

## 2017-10-06 MED ORDER — MIRTAZAPINE 15 MG PO TABS
15.0000 mg | ORAL_TABLET | Freq: Every day | ORAL | Status: DC
  Administered 2017-10-06 – 2017-10-14 (×9): 15 mg via ORAL
  Filled 2017-10-06 (×9): qty 1

## 2017-10-06 NOTE — Evaluation (Signed)
Clinical/Bedside Swallow Evaluation Patient Details  Name: Kathleen Jensen MRN: [id number] Date of Birth: 04/12/1951  Today's Date: 10/06/2017 Time: SLP Start Time (ACUTE ONLY): 1250 SLP Stop Time (ACUTE ONLY): 1315 SLP Time Calculation (min) (ACUTE ONLY): 25 min  Past Medical History:  Past Medical History:  Diagnosis Date  . Arthritis   . 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 Hypertension    Past Surgical History:  Past Surgical History:  Procedure Laterality Date  . BACK SURGERY     Dec 18, 2016 Dr.Torrealba  . BREAST BIOPSY Right   . BREAST EXCISIONAL BIOPSY    . CERVICAL SPINE SURGERY     C4  . CESAREAN SECTION    . GASTRIC BYPASS  03/2007  . TONSILLECTOMY     HPI:  67 year old Female admitted 10/05/17 with sudden onset of aphasia. PMH significant for CKD3, ICH with right hemi (October 2018), HTN, HLD, seizure (January 2019). Head CT = negative for acute abnormalities, chronic Left frontal/parietal and cerebellar infarcts.   Assessment / Plan / Recommendation Clinical Impression  Pt presents with adequate oral motor strength and function. No overt s/s aspiration observed on any consistency. Recommend soft diet with chopped meats (due to RUE flaccidity), thin liquids via cup or straw. Pt will need assistance to open containers, but is able to self feed without difficulty. ST will continue to follow acutely for assessment of diet tolerance and education. RN informed of results and recommendations.    SLP Visit Diagnosis: Dysphagia, unspecified (R13.10)    Aspiration Risk  Mild aspiration risk    Diet Recommendation Dysphagia 3 (Mech soft);Thin liquid   Liquid Administration via: Cup;Straw Medication Administration: Whole meds with liquid Supervision: Patient able to self feed;Staff to assist with self feeding;Intermittent supervision to cue for compensatory strategies Compensations: Minimize environmental distractions;Small  sips/bites Postural Changes: Seated upright at 90 degrees    Other  Recommendations Oral Care Recommendations: Oral care BID   Follow up Recommendations 24 hour supervision/assistance      Frequency and Duration min 1 x/week  2 weeks       Prognosis Prognosis for Safe Diet Advancement: Fair Barriers to Reach Goals: Time post onset      Swallow Study   General Date of Onset: 10/05/17 HPI: 67 year old Female admitted 10/05/17 with sudden onset of aphasia. PMH significant for CKD3, ICH with right hemi (October 2018), HTN, HLD, seizure (January 2019). Head CT = negative for acute abnormalities, chronic Left frontal/parietal and cerebellar infarcts. Type of Study: Bedside Swallow Evaluation Previous Swallow Assessment: none Diet Prior to this Study: NPO Temperature Spikes Noted: No Respiratory Status: Room air History of Recent Intubation: No Behavior/Cognition: Alert;Cooperative;Pleasant mood;Doesn't follow directions;Requires cueing Oral Cavity Assessment: Within Functional Limits Oral Care Completed by SLP: Yes Oral Cavity - Dentition: Adequate natural dentition Vision: Functional for self-feeding Self-Feeding Abilities: Able to feed self;Needs set up;Needs assist(RUE flaccid) Patient Positioning: Upright in bed Baseline Vocal Quality: Normal Volitional Cough: Cognitively unable to elicit Volitional Swallow: Unable to elicit    Oral/Motor/Sensory Function Overall Oral Motor/Sensory Function: Within functional limits   Ice Chips Ice chips: Not tested   Thin Liquid Thin Liquid: Within functional limits Presentation: Cup;Self Fed;Straw    Nectar Thick Nectar Thick Liquid: Not tested   Honey Thick Honey Thick Liquid: Not tested   Puree Puree: Within functional limits Presentation: Self Fed;Spoon   Solid   GO   Solid: Within functional limits Presentation: Self Fed  [name] B. Murvin [facility], CCC-SLP Speech Language Pathologist [phone]  Leigh Aurora 10/06/2017,1:59 PM

## 2017-10-06 NOTE — Evaluation (Signed)
Physical Therapy Evaluation Patient Details Name: Kathleen ChickRovena Jensen MRN: [id number] DOB: [birth year] Today's Date: 10/06/2017   History of Present Illness  67 y.o. female with PMH significant for ICH in October, seizure in January 2019, HTN, hemiparesis, CKD stage III, HLD, hypothyroidism, presents to the ED from [facility] with chief complaint of acute aphasia. NIH:9. Head CT-unremarkable. Possible new A-fib vs status epileptus.   Clinical Impression  Patient presents with baseline right hemiparesis from prior stroke, expressive language deficits (premorbid?) and impaired mobility s/p above. Per MD notes, pt uses lift for transfers at home, is non ambulatory and [facility] Tolerated standing in stedy today with assist of 2 and transferring to a chair. Not sure what pt's functional/cognitive baseline are but anticipate pt is close based on notes. Will need to talk with family to find out. Will follow acutely to maximize independence and mobility and ease burden of care prior to d/c.    Follow Up Recommendations Home health PT;Supervision for mobility/OOB;Supervision/Assistance - 24 hour(continue HH services)    Equipment Recommendations  None recommended by PT    Recommendations for Other Services       Precautions / Restrictions Precautions Precautions: Fall Precaution Comments: right hemiparesis from prior stroke Restrictions Weight Bearing Restrictions: No      Mobility  Bed Mobility Overal bed mobility: Needs Assistance Bed Mobility: Rolling;Sidelying to Sit Rolling: Mod assist Sidelying to sit: Max assist       General bed mobility comments: Mod A for bringing LEs towards EOB with cues to use LLE to assist RLE. Max A for elevating trunk and bringing hips towards EOB with use of bed pad  Transfers Overall transfer level: Needs assistance Equipment used: Ambulation equipment used Transfers: Sit to/from Stand Sit to Stand: Max assist;+2  physical assistance         General transfer comment: Pt performing sit<>stand with bari stedy to pull up and block Bil knees. Pt requiring Max A +2 for initial sit<>Stand to power up into standing; right lateral lean noted. Assist to place right hand.  Mod A for sit<>stand from bari stedy with elevated surface. Transferred to chair.  Ambulation/Gait             General Gait Details: unable at baseline  Stairs            Wheelchair Mobility    Modified Rankin (Stroke Patients Only) Modified Rankin (Stroke Patients Only) Pre-Morbid Rankin Score: Severe disability Modified Rankin: Severe disability     Balance Overall balance assessment: Needs assistance Sitting-balance support: Feet supported;Single extremity supported Sitting balance-Leahy Scale: Poor Sitting balance - Comments: Right lateral lean requiring Mod-Max A for sitting at EOB Postural control: Right lateral lean Standing balance support: During functional activity Standing balance-Leahy Scale: Zero Standing balance comment: Reliant on physical A to maintain standing with right lateral lean.                             Pertinent Vitals/Pain Pain Assessment: Faces Faces Pain Scale: Hurts little more Pain Location: Right UE with movement Pain Descriptors / Indicators: Constant;Discomfort;Grimacing Pain Intervention(s): Monitored during session;Repositioned    Home Living Family/patient expects to be discharged to:: Private residence Living Arrangements: Children(granddaughter) Available Help at Discharge: Family             Additional Comments: No family present to confirm PLOF, support, or home set up    Prior Function Level of Independence:  Needs assistance   Gait / Transfers Assistance Needed: RN reports that family was using lift at home and [facility] therapists were working towards transfers and standing. Non ambulatory since stroke last year.   ADL's / Homemaking Assistance Needed:  Pt reports that daughter assisted with BADLs  Comments: No family present to confirm     Hand Dominance   Dominant Hand: (R hemi)    Extremity/Trunk Assessment   Upper Extremity Assessment Upper Extremity Assessment: Defer to OT evaluation RUE Deficits / Details: Baseline hemiparesis. Flexor synergy pattern with increased tone. Pt performing slight AROM to elevate RUE off pillow with compensatory shoulder hiking RUE Sensation: decreased light touch RUE Coordination: decreased fine motor;decreased gross motor    Lower Extremity Assessment Lower Extremity Assessment: RLE deficits/detail RLE Deficits / Details: Baseline hemiparesis; able to lift RLE against gravity using mostly hip flexors and trunk in flexor synergy pattern with some tone. No ankle AROM noted.  RLE Sensation: decreased light touch RLE Coordination: decreased fine motor;decreased gross motor    Cervical / Trunk Assessment Cervical / Trunk Assessment: Other exceptions Cervical / Trunk Exceptions: right lateral lean  Communication   Communication: Expressive difficulties  Cognition Arousal/Alertness: Awake/alert Behavior During Therapy: WFL for tasks assessed/performed Overall Cognitive Status: No family/caregiver present to determine baseline cognitive functioning Area of Impairment: Orientation;Attention;Memory;Following commands;Safety/judgement;Awareness;Problem solving                 Orientation Level: Disoriented to;Time;Situation Current Attention Level: Sustained Memory: Decreased short-term memory Following Commands: Follows one step commands inconsistently;Follows one step commands with increased time Safety/Judgement: Decreased awareness of safety;Decreased awareness of deficits Awareness: Intellectual Problem Solving: Slow processing;Difficulty sequencing;Requires verbal cues;Decreased initiation General Comments: Pt requiring increased time and cues throughout session. Confused to DOB, place,  and time. Required max cues for orienting to day of week; providing visual cues and pt required max VCs to attend to visual cue. No family present to provide informaiton on baseline cognition. Needs to be redirected to task and questions asked, tangential with speech.      General Comments      Exercises     Assessment/Plan    PT Assessment Patient needs continued PT services  PT Problem List Decreased strength;Decreased mobility;Impaired tone;Decreased range of motion;Decreased cognition;Pain;Impaired sensation;Decreased balance       PT Treatment Interventions Functional mobility training;Balance training;Patient/family education;Therapeutic activities;Neuromuscular re-education;Wheelchair mobility training;Therapeutic exercise;Cognitive remediation    PT Goals (Current goals can be found in the Care Plan section)  Acute Rehab PT Goals Patient Stated Goal: Go home PT Goal Formulation: With patient Time For Goal Achievement: 10/20/17 Potential to Achieve Goals: Good    Frequency Min 3X/week   Barriers to discharge   not sure of level of support at home    Co-evaluation PT/OT/SLP Co-Evaluation/Treatment: Yes Reason for Co-Treatment: For patient/therapist safety;To address functional/ADL transfers PT goals addressed during session: Mobility/safety with mobility;Balance         AM-PAC PT "6 Clicks" Daily Activity  Outcome Measure Difficulty turning over in bed (including adjusting bedclothes, sheets and blankets)?: Unable Difficulty moving from lying on back to sitting on the side of the bed? : Unable Difficulty sitting down on and standing up from a chair with arms (e.g., wheelchair, bedside commode, etc,.)?: Unable Help needed moving to and from a bed to chair (including a wheelchair)?: Total Help needed walking in hospital room?: Total Help needed climbing 3-5 steps with a railing? : Total 6 Click Score: 6    End of Session Equipment  Utilized During Treatment: Gait  belt Activity Tolerance: Patient tolerated treatment well Patient left: in chair;with call bell/phone within reach;with chair alarm set Nurse Communication: Mobility status;Need for lift equipment(stedy) PT Visit Diagnosis: Hemiplegia and hemiparesis;Muscle weakness (generalized) (M62.81);Pain Hemiplegia - Right/Left: Right Hemiplegia - dominant/non-dominant: Dominant Pain - Right/Left: Right Pain - part of body: Arm    Time: 0835-0900 PT Time Calculation (min) (ACUTE ONLY): 25 min   Charges:   PT Evaluation $PT Eval Moderate Complexity: 1 Mod     PT G CodesMylo Red, PT, DPT [phone]    Blake Divine A Derrion Tritz 10/06/2017, 11:55 AM

## 2017-10-06 NOTE — Progress Notes (Signed)
PROGRESS NOTE  Kathleen Jensen ZDG:[id number] DOB: 01-02-1951 DOA: 10/05/2017 PCP: Donato Schultz, DO  HPI/Recap of past 24 hours: Kathleen Jensen is a 67 y.o. 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 She is evaluated by neurology who opined subclinical seizures. [facility] are asked to admit.  10/06/2017: Patient seen and examined at her bedside.  Expressive aphasia is persistent.  She appears comfortable. Right upper and lower extremities weakness.  No new complaints.    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  Greatly appreciated. -EEG done on 10/05/2017 revealed: Left posterior quadrant sharp waves and left posterior quadrant slow activity -Continue Keppra 2000 mg twice daily, Dilantin 100 mg 3 times daily, load Zantac 20 mg IV once daily continue other 100 mg twice daily as recommended by neurology. -Neurology planning on repeating EEG on Tuesday 10/08/1918 assess for improvement in the left posterior quadrant sharp waves left posterior quadrants no activity following the addition of Vimpat.  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  Chronic constipation -Continue Senokot    Code Status: Full  Family Communication: None at bedside  Disposition Plan: To be determined   Consultants:  Neurology  Procedures:  None  Antimicrobials:  None  DVT prophylaxis: SCDs   Objective: Vitals:   10/06/17 0059 10/06/17 0514 10/06/17 0935 10/06/17 1156  BP: (!) 148/58 132/88 129/85 (!) 159/92  Pulse: 81 61 68 71  Resp: 18 18 16 16   Temp: (!) 100.8 F (38.2 C)  98.1 F (36.7 C) 97.6 F (36.4 C)  TempSrc: Oral  Oral Oral  SpO2:   97% 98%  Weight:      Height:        Intake/Output Summary (Last 24 hours) at 10/06/2017 1435 Last data filed at 10/05/2017 1558 Gross per 24 hour  Intake 80 ml  Output -  Net 80 ml   Filed Weights   10/05/17 1746  Weight: 83.7 kg (184 lb 8.4 oz)    Exam:   General: 67 year old African-American female obese lying in bed in no apparent acute distress.  Alert with expressive aphasia.  Cardiovascular: Regular rate and rhythm with no rubs or gallops  Respiratory: Clear to auscultation with no wheezes or rales  Abdomen: Obese nontender nondistended normal bowel sounds x 4  Musculoskeletal: Right upper and right lower extremity weakness compared to left  Skin: No rash noted  Psychiatry: Mood is appropriate for condition and setting   Data Reviewed: CBC: Recent Labs  Lab 10/05/17 1250  WBC 7.8  NEUTROABS 3.9  HGB 12.9  HCT 38.3  MCV 85.9  PLT 199  Basic Metabolic Panel: Recent Labs  Lab 10/05/17 1250  NA 139  K 3.4*  CL 105  CO2 23  GLUCOSE 99  BUN 12  CREATININE 0.75  CALCIUM 9.6   GFR: Estimated Creatinine Clearance: 71.4 mL/min (by C-G  formula based on SCr of 0.75 mg/dL). Liver Function Tests: Recent Labs  Lab 10/05/17 1250  AST 38  ALT 20  ALKPHOS 68  BILITOT 0.7  PROT 7.7  ALBUMIN 3.8   No results for input(s): LIPASE, AMYLASE in the last 168 hours. No results for input(s): AMMONIA in the last 168 hours. Coagulation Profile: Recent Labs  Lab 10/05/17 1250  INR 1.01   Cardiac Enzymes: Recent Labs  Lab 10/05/17 1250  TROPONINI <0.03   BNP (last 3 results) No results for input(s): PROBNP in the last 8760 hours. HbA1C: Recent Labs    10/05/17 1923 10/06/17 0646  HGBA1C 5.0 5.0   CBG: Recent Labs  Lab 10/05/17 1231  GLUCAP 77   Lipid Profile: Recent Labs    10/06/17 0646  CHOL 147  HDL 58  LDLCALC 76  TRIG 64  CHOLHDL 2.5   Thyroid Function Tests: No results for input(s): TSH, T4TOTAL, FREET4, T3FREE, THYROIDAB in the last 72 hours. Anemia Panel: No results for input(s): VITAMINB12, FOLATE, FERRITIN, TIBC, IRON, RETICCTPCT in the last 72 hours. Urine analysis:    Component Value Date/Time   BILIRUBINUR 1+ 11/19/2016 1307   PROTEINUR trace 11/19/2016 1307   UROBILINOGEN 1.0 11/19/2016 1307   NITRITE positive 11/19/2016 1307   LEUKOCYTESUR Negative 11/19/2016 1307   Sepsis Labs: @LABRCNTIP (procalcitonin:4,lacticidven:4)  )No results found for this or any previous visit (from the past 240 hour(s)).    Studies: No results found.  Scheduled Meds: . aspirin  81 mg Oral Daily  . atorvastatin  10 mg Oral q1800  . enoxaparin (LOVENOX) injection  40 mg Subcutaneous Q24H  . [START ON 10/07/2017] ferrous sulfate  325 mg Oral Q breakfast  . levothyroxine  100 mcg Oral Daily  . mirtazapine  15 mg Oral QHS  . phenytoin (DILANTIN) IV  100 mg Intravenous Q8H  . pregabalin  25 mg Oral Daily  . senna-docusate  1 tablet Oral BID  . Vitamin D3  1,000 Units Oral q morning - 10a    Continuous Infusions: . sodium chloride 75 mL/hr at 10/06/17 1106  . lacosamide (VIMPAT) IV    .  levETIRAcetam Stopped (10/06/17 0750)     LOS: 1 day     Darlin Droparole N Mariah Harn, MD [facility] Pager [phone]  If 7PM-7AM, please contact night-coverage [email or web address] Password [email or web address] 10/06/2017, 2:35 PM

## 2017-10-06 NOTE — Evaluation (Signed)
Speech Language Pathology Evaluation Patient Details Name: Kathleen Jensen MRN: [id number] DOB: 1950/10/11 Today's Date: 10/06/2017 Time: 4098-1191 SLP Time Calculation (min) (ACUTE ONLY): 25 min  Problem List:  Patient Active Problem List   Diagnosis Date Noted  . Stroke-like symptoms 10/05/2017  . Diabetes mellitus without complication (HCC)   . Hyperlipidemia 09/22/2017  . Urinary frequency 09/22/2017  . Pre-operative clearance 11/19/2016  . Airway hyperreactivity 01/26/2016  . Disease of thyroid gland 01/26/2016  . 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 Other specified postprocedural states 01/26/2016  . 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 Chronic LBP 04/10/2015  . Degeneration of intervertebral disc of lumbar region 04/10/2015  . Spondylolisthesis of lumbar region 04/10/2015  . 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  . Chronic kidney disease (CKD), stage III (moderate) (HCC) 01/09/2015  . History of cardiac pacemaker in situ 01/09/2015  . B12 DEFICIENCY 08/15/2008  . UNSPECIFIED VITAMIN D DEFICIENCY 08/15/2008  . SINUS BRADYCARDIA 08/15/2008  . OBSTRUCTIVE SLEEP APNEA 08/11/2008  . INSOMNIA 04/28/2008  . Hyperlipidemia LDL goal <100 12/12/2006  . RHINITIS, ALLERGIC NEC 12/12/2006  . Hypothyroidism 12/11/2006  . OBESITY, MORBID 12/11/2006  . Essential hypertension 12/11/2006  . GERD 12/11/2006  . STRESS INCONTINENCE 12/11/2006   Past Medical History:  Past Medical History:  Diagnosis Date  . Arthritis   . 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 Hypertension    Past Surgical History:  Past Surgical History:  Procedure Laterality Date  . BACK SURGERY     Dec 18, 2016 Dr.Torrealba  . BREAST BIOPSY Right   . BREAST EXCISIONAL BIOPSY    . CERVICAL SPINE SURGERY     C4  . CESAREAN SECTION    . GASTRIC BYPASS  03/2007  . TONSILLECTOMY     HPI:  67 year old Female admitted 10/05/17 with sudden onset of aphasia. PMH  significant for CKD3, ICH with right hemi (October 2018), HTN, HLD, seizure (January 2019). Head CT = negative for acute abnormalities, chronic Left frontal/parietal and cerebellar infarcts.   Assessment / Plan / Recommendation Clinical Impression  Pt presents with both receptive and expressive aphasia, however, no family is present at this time to provide information on which deficits are residual from October 2018 ICH, and which are new. Pt is able to follow some simple commands, and benefits from use of gestures. Yes/no responses are not accurate. Expressively, pt is quite fluent, and verbalizes occasional appropriate phrases, however, her verbalizations are largely neologistic and perseverative. She is unable to provide automatic sequences, repeat words, complete sentences or name objects accurately. She is able to imitate sounds inconsistently. Pt's speech is intelligible. Pt would benefit from continued ST intervention to maximize communicative effectiveness and decrease caregiver burden. Discussion with family would be beneficial to clarify pt level of functioning prior to this admit, as Head CT reveals no acute abnormality. ST will continue to follow acutely.     SLP Assessment  SLP Recommendation/Assessment: Patient needs continued Speech Language Pathology Services SLP Visit Diagnosis: Aphasia (R47.01)    Follow Up Recommendations  24 hour supervision/assistance    Frequency and Duration min 1 x/week  2 weeks      SLP Evaluation Cognition  Overall Cognitive Status: No family/caregiver present to determine baseline cognitive functioning Arousal/Alertness: Awake/alert       Comprehension  Auditory Comprehension Overall Auditory Comprehension: Impaired Yes/No Questions: Impaired Basic Biographical Questions: 26-50% accurate Basic Immediate Environment Questions: 0-24% accurate Complex Questions: 0-24% accurate Commands: Impaired  One Step Basic Commands: 25-49% accurate Two Step  Basic Commands: 0-24% accurate Conversation: Simple Reading Comprehension Reading Status: Impaired Word level: Impaired    Expression Expression Primary Mode of Expression: Verbal Verbal Expression Overall Verbal Expression: Impaired Initiation: No impairment Automatic Speech: (unable) Level of Generative/Spontaneous Verbalization: Phrase;Word Repetition: Impaired Level of Impairment: Word level Naming: Impairment Responsive: 0-25% accurate Confrontation: Impaired Convergent: 0-24% accurate Divergent: 0-24% accurate Other Naming Comments: expresses some social/appropriate phrases -"here you go", "This is good" " I was hungry" "I shouldn't be hungry - shouldn't be like that" " I couldn't eat, and now I can eat" Verbal Errors: Perseveration;Neologisms;Semantic paraphasias Pragmatics: No impairment Written Expression Written Expression: Not tested   Oral / Motor  Oral Motor/Sensory Function Overall Oral Motor/Sensory Function: Within functional limits Motor Speech Overall Motor Speech: Appears within functional limits for tasks assessed Respiration: Within functional limits Phonation: Normal Resonance: Within functional limits Articulation: Within functional limitis Intelligibility: Intelligible Motor Planning: Witnin functional limits   GO                   Corin Tilly B. Murvin NatalBueche, [facility] CampusMSP, CCC-SLP Speech Language Pathologist [phone]  Leigh AuroraBueche, Ryliee Figge Brown 10/06/2017, 1:51 PM

## 2017-10-06 NOTE — Progress Notes (Signed)
SLP Cancellation Note  Patient Details Name: Kathleen Jensen MRN: [id number] DOB: 04-15-1951   Cancelled treatment:       Reason Eval/Treat Not Completed: Patient at procedure or test/unavailable. Pt currently off unit. Will continue efforts to complete BSE and communication/cognitive evaluation.  Rainy Rothman B. Murvin NatalBueche, [facility] HospitalMSP, CCC-SLP Speech Language Pathologist [phone]  Leigh AuroraBueche, Nivea Wojdyla Brown 10/06/2017, 10:26 AM

## 2017-10-06 NOTE — Evaluation (Signed)
Occupational Therapy Evaluation Patient Details Name: Kathleen Jensen MRN: [id number] DOB: 1950-11-01 Today's Date: 10/06/2017    History of Present Illness 67 y.o. 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    Clinical Impression   PTA, pt was living with her daughter and was receiving assistance for ADLs and functional transfers. Per MD notes, pt uses lift for transfers at home, is non ambulatory and is currently getting [facility] services. Pt requiring Max A for dressing, bathing, and toileting. Pt performing sit<>stand with bari-stedy and Max A +2. Pt would benefit from further acute OT to facilitate safe dc. Recommend dc to home with continues HH services and 24/7 assistance/supervision.      Follow Up Recommendations    HHOT; 24 hour supervision/assistance; Other (Pending information on family support; Continue HH services)   Equipment Recommendations  None recommended by OT;Other (comment)(Pending home information from family)    Recommendations for Other Services PT consult;Speech consult     Precautions / Restrictions Precautions Precautions: Fall;Other (comment)(Prior R hemi)      Mobility Bed Mobility Overal bed mobility: Needs Assistance Bed Mobility: Rolling;Sidelying to Sit Rolling: Mod assist Sidelying to sit: Max assist       General bed mobility comments: Mod A for bringing LEs towards EOB. Max A for elevating trunk and bringing hips towards EOB with use of bed pad  Transfers Overall transfer level: Needs assistance   Transfers: Sit to/from Stand Sit to Stand: Max assist;+2 physical assistance         General transfer comment: Pt performing sit<>stand with bari stedy to pull up and block Bil knees. Pt requiring Max A +2 for initial  sit<>Stand to power up into standing and prevent right laterl lean. Mod A for sit<>stand from bari stedy with elevated surface.     Balance Overall balance assessment: Needs assistance Sitting-balance support: No upper extremity supported;Feet supported Sitting balance-Leahy Scale: Poor Sitting balance - Comments: Right lateral lean requiring Mod-Max A for sitting at EOB Postural control: Right lateral lean Standing balance support: Bilateral upper extremity supported;During functional activity Standing balance-Leahy Scale: Zero Standing balance comment: Reliant on physical A to maintain standing                           ADL either performed or assessed with clinical judgement   ADL Overall ADL's : Needs assistance/impaired Eating/Feeding: Moderate assistance;Sitting   Grooming: Sitting;Moderate assistance   Upper Body Bathing: Maximal assistance;Sitting;Bed level   Lower Body Bathing: +2 for physical assistance;Maximal assistance;Bed level   Upper Body Dressing : Maximal assistance;Sitting   Lower Body Dressing: Maximal assistance;Bed level Lower Body Dressing Details (indicate cue type and reason): donned socks at bedlevel. Able to lift LUE for donning socks; not movements for right sock Toilet Transfer: Maximal assistance;+2 for physical assistance(simulated to recliner; bari stedy) Toilet Transfer Details (indicate cue type and reason): Pt performing sit<>Stand with bari stedy and Max A +2. Pt requiring Mod-Max A for maintaining sitting posture on stedy (noted right lateral lean) to transition to recliner.  Toileting- Clothing Manipulation and Hygiene: Maximal assistance;+2 for physical assistance;Sit to/from stand Toileting - Clothing Manipulation Details (indicate cue type and reason): Max A to stand and maitain standing with total A. Second person to perform toilet hygiene after incontiant of urine in bed.  Functional mobility during ADLs: Maximal  assistance;+2 for physical assistance(bari stedy) General ADL Comments: Pt performing Max A for ADLs. Pt sitting at EOB with Mod-Max A for holding upright posture. Pt with 1-2 minutes sitting EOB with Min Guard A. Noted right inattention to body and environment. Pt      Vision         Perception     Praxis      Pertinent Vitals/Pain Pain Assessment: Faces Faces Pain Scale: Hurts little more Pain Location: Right UE with movement Pain Descriptors / Indicators: Constant;Discomfort;Grimacing Pain Intervention(s): Monitored during session     Hand Dominance (R hemi)   Extremity/Trunk Assessment Upper Extremity Assessment Upper Extremity Assessment: RUE deficits/detail RUE Deficits / Details: Baseline hemiparesis. Flexor synergy pattern with increased tone. Pt performing slight AROM to elevate RUE off pillow with compensatory shoulder hiking RUE Sensation: decreased light touch RUE Coordination: decreased fine motor;decreased gross motor   Lower Extremity Assessment Lower Extremity Assessment: Defer to PT evaluation   Cervical / Trunk Assessment Cervical / Trunk Assessment: Other exceptions Cervical / Trunk Exceptions: right lateral lean   Communication Communication Communication: Expressive difficulties   Cognition Arousal/Alertness: Awake/alert Behavior During Therapy: WFL for tasks assessed/performed Overall Cognitive Status: No family/caregiver present to determine baseline cognitive functioning Area of Impairment: Orientation;Attention;Memory;Following commands;Safety/judgement;Awareness;Problem solving                 Orientation Level: Disoriented to;Time;Situation(Able to say hopsital and name; not DOB) Current Attention Level: Sustained Memory: Decreased short-term memory Following Commands: Follows one step commands inconsistently;Follows one step commands with increased time Safety/Judgement: Decreased awareness of safety;Decreased awareness of  deficits Awareness: Emergent Problem Solving: Slow processing;Difficulty sequencing;Requires verbal cues;Decreased initiation General Comments: Pt requiring increased time and cues thorughout session. Confused to DOB, place, and time. Required max cues for orienting to day of week; providing visual cues and pt required max VCs to attend to visual cue. No family present to provide informaiton ofn baseline cognition.    General Comments       Exercises     Shoulder Instructions      Home Living Family/patient expects to be discharged to:: Private residence Living Arrangements: Children(Daughter)                               Additional Comments: No family present to confirm PLOF, support, or home set up      Prior Functioning/Environment Level of Independence: Needs assistance  Gait / Transfers Assistance Needed: RN reports that family was using lift at home and [facility] therpists were working towards transfers.  ADL's / Homemaking Assistance Needed: Pt reports that daughter assisted with BADLs   Comments: No family present to confirm        OT Problem List: Decreased strength;Decreased range of motion;Decreased activity tolerance;Impaired balance (sitting and/or standing);Decreased cognition;Decreased coordination;Decreased safety awareness;Decreased knowledge of use of DME or AE;Decreased knowledge of precautions;Pain;Impaired UE functional use;Impaired sensation;Impaired tone      OT Treatment/Interventions: Self-care/ADL training;Therapeutic exercise;Energy conservation;DME and/or AE instruction;Therapeutic activities;Patient/family education    OT Goals(Current goals can be found in the care plan section) Acute Rehab OT Goals Patient Stated Goal: Go home OT Goal Formulation: With patient Time For Goal Achievement: 10/20/17 Potential to Achieve Goals: Good ADL Goals Pt Will Perform Grooming: with min assist;sitting Pt Will Perform Upper Body Dressing: with min  assist;sitting Pt Will Transfer to Toilet: with mod assist;with +2 assist;stand pivot transfer;bedside commode Additional  ADL Goal #1: Pt will perform bed mobility with Min A in preparation for ADLs in sitting  OT Frequency: Min 2X/week   Barriers to D/C:            Co-evaluation              AM-PAC PT "6 Clicks" Daily Activity     Outcome Measure Help from another person eating meals?: A Little Help from another person taking care of personal grooming?: A Lot Help from another person toileting, which includes using toliet, bedpan, or urinal?: A Lot Help from another person bathing (including washing, rinsing, drying)?: A Lot Help from another person to put on and taking off regular upper body clothing?: A Lot Help from another person to put on and taking off regular lower body clothing?: A Lot 6 Click Score: 13   End of Session Equipment Utilized During Treatment: Gait belt;Other (comment)(stedy) Nurse Communication: Mobility status;Need for lift equipment  Activity Tolerance: Patient tolerated treatment well Patient left: in chair;with call bell/phone within reach;with chair alarm set  OT Visit Diagnosis: Unsteadiness on feet (R26.81);Other abnormalities of gait and mobility (R26.89);Muscle weakness (generalized) (M62.81);Other symptoms and signs involving cognitive function;Hemiplegia and hemiparesis;Pain Hemiplegia - Right/Left: Right Hemiplegia - caused by: (Prior CVA) Pain - Right/Left: Right Pain - part of body: Arm                Time: [id number] OT Time Calculation (min): 25 min Charges:  OT General Charges $OT Visit: 1 Visit OT Evaluation $OT Eval Moderate Complexity: 1 Mod G-Codes:     Emilianna Barlowe MSOT, OTR/L Acute Rehab Pager: [phone] Office: [phone]  Theodoro GristCharis M Sachiko Methot 10/06/2017, 10:36 AM

## 2017-10-06 NOTE — Progress Notes (Signed)
Subjective: In the context of a receptive aphasia, the patient is able to endorse with some difficulty that she feels better today.   Objective: Current vital signs: BP 132/88 (BP Location: Right Arm)   Pulse 61   Temp (!) 100.8 F (38.2 C) (Oral)   Resp 18   Ht 5' 4"  (1.626 m)   Wt 83.7 kg (184 lb 8.4 oz)   SpO2 100%   BMI 31.67 kg/m  Vital signs in last 24 hours: Temp:  [97.3 F (36.3 C)-100.8 F (38.2 C)] 100.8 F (38.2 C) (03/11 0059) Pulse Rate:  [58-81] 61 (03/11 0514) Resp:  [13-26] 18 (03/11 0514) BP: (103-180)/(36-104) 132/88 (03/11 0514) SpO2:  [95 %-100 %] 100 % (03/10 1945) Weight:  [83.7 kg (184 lb 8.4 oz)] 83.7 kg (184 lb 8.4 oz) (03/10 1746)  Intake/Output from previous day: 03/10 0701 - 03/11 0700 In: 80 [IV Piggyback:80] Out: -  Intake/Output this shift: No intake/output data recorded. Nutritional status: Diet NPO time specified Fall precautions  Mental Status: Awake and alert with good attention and eye contact. Prominent receptive aphasia with ability to follow approximately 20% of simple motor commands. Frequent word salad with some discernible phrases. Able to answer some simple questions. Able to count fingers. Understands that she is being asked to name something, but cannot produce the correct words. Will intermittently mimic but not consistently.  Cranial Nerves:  III,IV, VI: EOM are full with some hesitancy in rightward gaze.  VII: Facial movement is symmetric.  VIII: hearing is intact to voice Motor: 5/5 strength LUE and LLE. 4/5 on right - baseline deficit  Lab Results: Results for orders placed or performed during the hospital encounter of 10/05/17 (from the past 48 hour(s))  CBG monitoring, ED     Status: None   Collection Time: 10/05/17 12:31 PM  Result Value Ref Range   Glucose-Capillary 77 65 - 99 mg/dL  Protime-INR     Status: None   Collection Time: 10/05/17 12:50 PM  Result Value Ref Range   Prothrombin Time 13.2 11.4 - 15.2  seconds   INR 1.01     Comment: Performed at [facility], [address] [place], [place] [address]  APTT     Status: None   Collection Time: 10/05/17 12:50 PM  Result Value Ref Range   aPTT 29 24 - 36 seconds    Comment: Performed at [facility], [address] [place], [place] [address]  CBC     Status: None   Collection Time: 10/05/17 12:50 PM  Result Value Ref Range   WBC 7.8 4.0 - 10.5 K/uL   RBC 4.46 3.87 - 5.11 MIL/uL   Hemoglobin 12.9 12.0 - 15.0 g/dL   HCT 38.3 36.0 - 46.0 %   MCV 85.9 78.0 - 100.0 fL   MCH 28.9 26.0 - 34.0 pg   MCHC 33.7 30.0 - 36.0 g/dL   RDW 13.4 11.5 - 15.5 %   Platelets 199 150 - 400 K/uL    Comment: Performed at [facility], [address] [place], [place] [address]  Differential     Status: None   Collection Time: 10/05/17 12:50 PM  Result Value Ref Range   Neutrophils Relative % 50 %   Neutro Abs 3.9 1.7 - 7.7 K/uL   Lymphocytes Relative 39 %   Lymphs Abs 3.1 0.7 - 4.0 K/uL   Monocytes Relative 8 %   Monocytes Absolute 0.6 0.1 - 1.0  K/uL   Eosinophils Relative 3 %   Eosinophils Absolute 0.2 0.0 - 0.7 K/uL   Basophils Relative 0 %   Basophils Absolute 0.0 0.0 - 0.1 K/uL    Comment: Performed at [facility], [address] [place], [place] [address]  Comprehensive metabolic panel     Status: Abnormal   Collection Time: 10/05/17 12:50 PM  Result Value Ref Range   Sodium 139 135 - 145 mmol/L   Potassium 3.4 (L) 3.5 - 5.1 mmol/L   Chloride 105 101 - 111 mmol/L   CO2 23 22 - 32 mmol/L   Glucose, Bld 99 65 - 99 mg/dL   BUN 12 6 - 20 mg/dL   Creatinine, Ser 0.75 0.44 - 1.00 mg/dL   Calcium 9.6 8.9 - 10.3 mg/dL   Total Protein 7.7 6.5 - 8.1 g/dL   Albumin 3.8 3.5 - 5.0 g/dL   AST 38 15 - 41 U/L   ALT 20 14 - 54 U/L   Alkaline Phosphatase 68 38 - 126 U/L   Total Bilirubin 0.7 0.3 - 1.2 mg/dL   GFR calc non Af Amer >60 >60 mL/min   GFR calc Af Amer >60 >60 mL/min    Comment: (NOTE) The  eGFR has been calculated using the CKD EPI equation. This calculation has not been validated in all clinical situations. eGFR's persistently <60 mL/min signify possible Chronic Kidney Disease.    Anion gap 11 5 - 15    Comment: Performed at [facility], [address] [place], [place] [address]  Troponin I     Status: None   Collection Time: 10/05/17 12:50 PM  Result Value Ref Range   Troponin I <0.03 <0.03 ng/mL    Comment: Performed at [facility], [address] [place], [place] [address]  Hemoglobin A1c     Status: None   Collection Time: 10/05/17  7:23 PM  Result Value Ref Range   Hgb A1c MFr Bld 5.0 4.8 - 5.6 %    Comment: (NOTE) Pre diabetes:          5.7%-6.4% Diabetes:              >6.4% Glycemic control for   <7.0% adults with diabetes    Mean Plasma Glucose 96.8 mg/dL    Comment: Performed at [facility] [address] [place], [place] [address]  Phenytoin level, total     Status: None   Collection Time: 10/06/17  6:46 AM  Result Value Ref Range   Phenytoin Lvl 18.1 10.0 - 20.0 ug/mL    Comment: Performed at [facility] [address], [place], [place] [address]  Hemoglobin A1c     Status: None   Collection Time: 10/06/17  6:46 AM  Result Value Ref Range   Hgb A1c MFr Bld 5.0 4.8 - 5.6 %    Comment: (NOTE) Pre diabetes:          5.7%-6.4% Diabetes:              >6.4% Glycemic control for   <7.0% adults with diabetes    Mean Plasma Glucose 96.8 mg/dL    Comment: Performed at [facility] [address], [place], [place] [address]  Lipid panel     Status: None   Collection Time: 10/06/17  6:46 AM  Result Value Ref Range   Cholesterol 147 0 - 200 mg/dL   Triglycerides 64 <150 mg/dL  HDL 58 >40 mg/dL   Total CHOL/HDL Ratio 2.5 RATIO   VLDL 13 0 - 40 mg/dL   LDL Cholesterol 76 0 - 99 mg/dL    Comment:        Total Cholesterol/HDL:CHD Risk Coronary Heart Disease Risk Table                     Men   Women   1/2 Average Risk   3.4   3.3  Average Risk       5.0   4.4  2 X Average Risk   9.6   7.1  3 X Average Risk  23.4   11.0        Use the calculated Patient Ratio above and the CHD Risk Table to determine the patient's CHD Risk.        ATP III CLASSIFICATION (LDL):  <100     mg/dL   Optimal  100-129  mg/dL   Near or Above                    Optimal  130-159  mg/dL   Borderline  160-189  mg/dL   High  >190     mg/dL   Very High Performed at [facility] [address] [place],  [address]     No results found for this or any previous visit (from the past 240 hour(s)).  Lipid Panel Recent Labs    10/06/17 0646  CHOL 147  TRIG 64  HDL 58  CHOLHDL 2.5  VLDL 13  LDLCALC 76    Studies/Results: Ct Angio Head W Or Wo Contrast  Result Date: 10/05/2017 CLINICAL DATA:  Slurred speech.  Prior stroke. EXAM: CT ANGIOGRAPHY HEAD AND NECK CT PERFUSION BRAIN TECHNIQUE: Multidetector CT imaging of the head and neck was performed using the standard protocol during bolus administration of intravenous contrast. Multiplanar CT image reconstructions and MIPs were obtained to evaluate the vascular anatomy. Carotid stenosis measurements (when applicable) are obtained utilizing NASCET criteria, using the distal internal carotid diameter as the denominator. Multiphase CT imaging of the brain was performed following IV bolus contrast injection. Subsequent parametric perfusion maps were calculated using RAPID software. CONTRAST:  65m ISOVUE-370 IOPAMIDOL (ISOVUE-370) INJECTION 76% COMPARISON:  None. FINDINGS: CTA NECK FINDINGS Aortic arch: Normal variant aortic arch branching pattern with common origin of brachiocephalic and left common carotid arteries. Mild arch atherosclerosis. Widely patent arch vessel origins. Right carotid system: Patent with mild calcified and soft plaque at the carotid bifurcation. No significant stenosis or evidence of dissection. Left carotid system: Patent with minimal  calcified plaque at the carotid bifurcation. No stenosis or evidence of dissection. Vertebral arteries: Patent and codominant without evidence of stenosis or dissection. Skeleton: Remote, solid interbody osseous fusion at C4-5. Bilateral C4-5 facet ankylosis. Multilevel disc degeneration, advanced at C5-6 and C6-7. Bulky posterior spurring/disc osteophyte complex at C5-6 results in moderate spinal stenosis. Other neck: No mass or enlarged lymph nodes. Upper chest: Clear lung apices. Review of the MIP images confirms the above findings CTA HEAD FINDINGS Anterior circulation: The internal carotid arteries are patent from skull base to carotid termini with mild nonstenotic siphon atherosclerosis bilaterally. ACAs and MCAs are patent without evidence of proximal branch occlusion or significant proximal stenosis. No aneurysm or vascular malformation. Posterior circulation: The intracranial vertebral arteries are widely patent to the basilar. PICAs and SCAs are unremarkable. AICAs are small and not well evaluated. The basilar artery is widely patent.  There is a small right posterior communicating artery. PCAs are patent without evidence of significant stenosis. No aneurysm or vascular malformation. Venous sinuses: As permitted by contrast timing, patent. Anatomic variants: None. Delayed phase: Not performed. Review of the MIP images confirms the above findings CT Brain Perfusion Findings: CBF (<30%) Volume: 95m Perfusion (Tmax>6.0s) volume: 063mMismatch Volume: n/a Infarction Location: n/a There is increased cerebral blood volume and blood flow diffusely throughout cortex in the posterior left temporal, left occipital, and left parietal lobes in the setting of an old high left parietal cortical infarct. IMPRESSION: 1. No large vessel occlusion. 2. Mild intracranial and cervical atherosclerosis without significant stenosis or aneurysm. 3. No evidence of acute ischemia on perfusion imaging. 4. Increased cortical blood flow  throughout the posterior left cerebral hemisphere, query seizure activity in the setting of an old left parietal cortical infarct. These results were called by telephone at the time of interpretation on 10/05/2017 at 2:43 pm to Dr. MCRoland Rack who verbally acknowledged these results. Electronically Signed   By: AlLogan Bores.D.   On: 10/05/2017 14:58   Ct Angio Neck W Or Wo Contrast  Result Date: 10/05/2017 CLINICAL DATA:  Slurred speech.  Prior stroke. EXAM: CT ANGIOGRAPHY HEAD AND NECK CT PERFUSION BRAIN TECHNIQUE: Multidetector CT imaging of the head and neck was performed using the standard protocol during bolus administration of intravenous contrast. Multiplanar CT image reconstructions and MIPs were obtained to evaluate the vascular anatomy. Carotid stenosis measurements (when applicable) are obtained utilizing NASCET criteria, using the distal internal carotid diameter as the denominator. Multiphase CT imaging of the brain was performed following IV bolus contrast injection. Subsequent parametric perfusion maps were calculated using RAPID software. CONTRAST:  9067mSOVUE-370 IOPAMIDOL (ISOVUE-370) INJECTION 76% COMPARISON:  None. FINDINGS: CTA NECK FINDINGS Aortic arch: Normal variant aortic arch branching pattern with common origin of brachiocephalic and left common carotid arteries. Mild arch atherosclerosis. Widely patent arch vessel origins. Right carotid system: Patent with mild calcified and soft plaque at the carotid bifurcation. No significant stenosis or evidence of dissection. Left carotid system: Patent with minimal calcified plaque at the carotid bifurcation. No stenosis or evidence of dissection. Vertebral arteries: Patent and codominant without evidence of stenosis or dissection. Skeleton: Remote, solid interbody osseous fusion at C4-5. Bilateral C4-5 facet ankylosis. Multilevel disc degeneration, advanced at C5-6 and C6-7. Bulky posterior spurring/disc osteophyte complex at C5-6  results in moderate spinal stenosis. Other neck: No mass or enlarged lymph nodes. Upper chest: Clear lung apices. Review of the MIP images confirms the above findings CTA HEAD FINDINGS Anterior circulation: The internal carotid arteries are patent from skull base to carotid termini with mild nonstenotic siphon atherosclerosis bilaterally. ACAs and MCAs are patent without evidence of proximal branch occlusion or significant proximal stenosis. No aneurysm or vascular malformation. Posterior circulation: The intracranial vertebral arteries are widely patent to the basilar. PICAs and SCAs are unremarkable. AICAs are small and not well evaluated. The basilar artery is widely patent. There is a small right posterior communicating artery. PCAs are patent without evidence of significant stenosis. No aneurysm or vascular malformation. Venous sinuses: As permitted by contrast timing, patent. Anatomic variants: None. Delayed phase: Not performed. Review of the MIP images confirms the above findings CT Brain Perfusion Findings: CBF (<30%) Volume: 0mL61mrfusion (Tmax>6.0s) volume: 0mL 60mmatch Volume: n/a Infarction Location: n/a There is increased cerebral blood volume and blood flow diffusely throughout cortex in the posterior left temporal, left occipital, and left parietal lobes in the  setting of an old high left parietal cortical infarct. IMPRESSION: 1. No large vessel occlusion. 2. Mild intracranial and cervical atherosclerosis without significant stenosis or aneurysm. 3. No evidence of acute ischemia on perfusion imaging. 4. Increased cortical blood flow throughout the posterior left cerebral hemisphere, query seizure activity in the setting of an old left parietal cortical infarct. These results were called by telephone at the time of interpretation on 10/05/2017 at 2:43 pm to Dr. Roland Rack , who verbally acknowledged these results. Electronically Signed   By: Logan Bores M.D.   On: 10/05/2017 14:58   Ct  Cerebral Perfusion W Contrast  Result Date: 10/05/2017 CLINICAL DATA:  Slurred speech.  Prior stroke. EXAM: CT ANGIOGRAPHY HEAD AND NECK CT PERFUSION BRAIN TECHNIQUE: Multidetector CT imaging of the head and neck was performed using the standard protocol during bolus administration of intravenous contrast. Multiplanar CT image reconstructions and MIPs were obtained to evaluate the vascular anatomy. Carotid stenosis measurements (when applicable) are obtained utilizing NASCET criteria, using the distal internal carotid diameter as the denominator. Multiphase CT imaging of the brain was performed following IV bolus contrast injection. Subsequent parametric perfusion maps were calculated using RAPID software. CONTRAST:  57m ISOVUE-370 IOPAMIDOL (ISOVUE-370) INJECTION 76% COMPARISON:  None. FINDINGS: CTA NECK FINDINGS Aortic arch: Normal variant aortic arch branching pattern with common origin of brachiocephalic and left common carotid arteries. Mild arch atherosclerosis. Widely patent arch vessel origins. Right carotid system: Patent with mild calcified and soft plaque at the carotid bifurcation. No significant stenosis or evidence of dissection. Left carotid system: Patent with minimal calcified plaque at the carotid bifurcation. No stenosis or evidence of dissection. Vertebral arteries: Patent and codominant without evidence of stenosis or dissection. Skeleton: Remote, solid interbody osseous fusion at C4-5. Bilateral C4-5 facet ankylosis. Multilevel disc degeneration, advanced at C5-6 and C6-7. Bulky posterior spurring/disc osteophyte complex at C5-6 results in moderate spinal stenosis. Other neck: No mass or enlarged lymph nodes. Upper chest: Clear lung apices. Review of the MIP images confirms the above findings CTA HEAD FINDINGS Anterior circulation: The internal carotid arteries are patent from skull base to carotid termini with mild nonstenotic siphon atherosclerosis bilaterally. ACAs and MCAs are patent  without evidence of proximal branch occlusion or significant proximal stenosis. No aneurysm or vascular malformation. Posterior circulation: The intracranial vertebral arteries are widely patent to the basilar. PICAs and SCAs are unremarkable. AICAs are small and not well evaluated. The basilar artery is widely patent. There is a small right posterior communicating artery. PCAs are patent without evidence of significant stenosis. No aneurysm or vascular malformation. Venous sinuses: As permitted by contrast timing, patent. Anatomic variants: None. Delayed phase: Not performed. Review of the MIP images confirms the above findings CT Brain Perfusion Findings: CBF (<30%) Volume: 087mPerfusion (Tmax>6.0s) volume: 11m711mismatch Volume: n/a Infarction Location: n/a There is increased cerebral blood volume and blood flow diffusely throughout cortex in the posterior left temporal, left occipital, and left parietal lobes in the setting of an old high left parietal cortical infarct. IMPRESSION: 1. No large vessel occlusion. 2. Mild intracranial and cervical atherosclerosis without significant stenosis or aneurysm. 3. No evidence of acute ischemia on perfusion imaging. 4. Increased cortical blood flow throughout the posterior left cerebral hemisphere, query seizure activity in the setting of an old left parietal cortical infarct. These results were called by telephone at the time of interpretation on 10/05/2017 at 2:43 pm to Dr. MCNRoland Rackwho verbally acknowledged these results. Electronically Signed   By:  Logan Bores M.D.   On: 10/05/2017 14:58   Ct Head Code Stroke Wo Contrast  Result Date: 10/05/2017 CLINICAL DATA:  Code stroke. Slurred speech. Prior stroke with residual right-sided deficits. EXAM: CT HEAD WITHOUT CONTRAST TECHNIQUE: Contiguous axial images were obtained from the base of the skull through the vertex without intravenous contrast. COMPARISON:  01/31/2005 FINDINGS: Brain: There is no evidence of  acute infarct, intracranial hemorrhage, mass, midline shift, or extra-axial fluid collection. There is a chronic left frontoparietal cortical and subcortical infarct near the vertex which is new from the prior study. Mild lateral and third ventriculomegaly is new and felt to reflect central predominant cerebral atrophy rather than hydrocephalus. Periventricular white matter hypodensities are nonspecific but compatible with mild chronic small vessel ischemic disease. A chronic subcentimeter infarct in the left cerebellum is new. Vascular: Calcified atherosclerosis at the skull base. No hyperdense vessel. Skull: No fracture focal osseous lesion. Sinuses/Orbits: Tiny left maxillary sinus mucous retention cyst. Clear mastoid air cells. Unremarkable orbits. Other: None. ASPECTS [facility] Stroke Program Early CT Score) - Ganglionic level infarction (caudate, lentiform nuclei, internal capsule, insula, M1-M3 cortex): 7 - Supraganglionic infarction (M4-M6 cortex): 3 Total score (0-10 with 10 being normal): 10 IMPRESSION: 1. No evidence of acute intracranial abnormality. 2. ASPECTS is 10. 3. 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 4. Mild chronic small vessel ischemic disease and cerebral atrophy. These results were called by telephone at the time of interpretation on 10/05/2017 at 12:49 pm to Dr. Blanchie Dessert , who verbally acknowledged these results. Electronically Signed   By: Logan Bores M.D.   On: 10/05/2017 12:50    Medications:  Scheduled: . atorvastatin  10 mg Oral q1800  . enoxaparin (LOVENOX) injection  40 mg Subcutaneous Q24H  . phenytoin (DILANTIN) IV  100 mg Intravenous Q8H   Continuous: . sodium chloride 75 mL/hr at 10/05/17 1822  . levETIRAcetam 2,000 mg (10/06/17 1025)    EEG 3/10:  1) left posterior quadrant sharp waves 2) left posterior quadrant slow activity Clinical Interpretation: This EEG is consistent with an area of epileptogenic potential in the left  posterior quadrant. There was no seizure recorded on this study.   Assessment: 67 year old female with a history of intraparenchymal  hematoma who presented with aphasia and right-sided weakness at [facility] in January and was felt to have had seizure activity as a cause.  She presented with a very similar presentation on Sunday. EEG showed significant cortical irritability but no ongoing seizure. She was loaded with Dilantin in addition to her home Keppra. 1. Today's exam with continued aphasia. No adventitious movements to suggest ongoing motor seizure, but focal seizure involving speech is high on the DDx. Overall clinical picture currently most consistent with postictal state secondary to subclinical seizure, possibly occurring overnight, versus extended postictal state from a possible seizure prior to admission. Still with significant right sided weakness.  2. History of left parietal cortical infarction 3. Possible new onset of atrial fibrillation  Recommendations: 1. Continue Keppra at home dose of 2000 mg BID (may have been taking incorrectly at lower dose at home).  2. Continue Dilantin at 100 mg TID. Obtain level (ordered) 3. Load Vimpat 200 mg IV x 1, then continue at 100 mg BID (ordered) 4. Will obtain repeat EEG on Tuesday to assess for improvement in the  left posterior quadrant sharp waves and left posterior quadrant slow activityfollowing addition of Vimpat   LOS: 1 day   @Electronically  signed: Dr. Kerney Elbe 10/06/2017  8:13 AM

## 2017-10-07 ENCOUNTER — Inpatient Hospital Stay (HOSPITAL_COMMUNITY): Payer: Medicare HMO

## 2017-10-07 LAB — GLUCOSE, CAPILLARY
GLUCOSE-CAPILLARY: 144 mg/dL — AB (ref 65–99)
Glucose-Capillary: 120 mg/dL — ABNORMAL HIGH (ref 65–99)
Glucose-Capillary: 93 mg/dL (ref 65–99)

## 2017-10-07 LAB — PHENYTOIN LEVEL, FREE AND TOTAL
PHENYTOIN FREE: 1.6 ug/mL (ref 1.0–2.0)
PHENYTOIN, TOTAL: 18.8 ug/mL (ref 10.0–20.0)

## 2017-10-07 MED ORDER — LORAZEPAM 2 MG/ML IJ SOLN
0.5000 mg | Freq: Once | INTRAMUSCULAR | Status: AC
  Administered 2017-10-07: 0.5 mg via INTRAVENOUS
  Filled 2017-10-07: qty 1

## 2017-10-07 NOTE — Care Management Note (Signed)
Case Management Note  Patient Details  Name: Kathleen Jensen MRN: [id number] Date of Birth: [birth year]  Subjective/Objective:      Pt admitted with stroke like symptoms. She is from home alone but has family assistance. [facility]               Action/Plan: PT/OT recommending HH services at d/c. Pt will need HH orders for resumption of services. CM following.  Expected Discharge Date:                  Expected Discharge Plan:  Home w Home Health Services  In-House Referral:     Discharge planning Services     Post Acute Care Choice:    Choice offered to:     DME Arranged:    DME Agency:     HH Arranged:    HH Agency:     Status of Service:  In process, will continue to follow  If discussed at Long Length of Stay Meetings, dates discussed:    Additional Comments:  Kermit BaloKelli F Shaquira Moroz, RN 10/07/2017, 12:45 PM

## 2017-10-07 NOTE — Telephone Encounter (Signed)
I'm not sure what kind of [employer] Claim form it is that was given to me by Dr. Laury AxonLowne, but I just completed as much as I can and it is in her blue folder/SLS 03/12

## 2017-10-07 NOTE — Progress Notes (Signed)
Subjective: Had period of screaming/agitation early this AM due to irritation from [facility]. She was verbal all through the night. Received Ativan and now somnolent.   Objective: Current vital signs: BP (!) 168/90 (BP Location: Right Arm)   Pulse (!) 114   Temp 98.3 F (36.8 C) (Oral)   Resp 20   Ht 5' 4" (1.626 m)   Wt 83.7 kg (184 lb 8.4 oz)   SpO2 99%   BMI 31.67 kg/m  Vital signs in last 24 hours: Temp:  [97.6 F (36.4 C)-98.4 F (36.9 C)] 98.3 F (36.8 C) (03/12 0453) Pulse Rate:  [59-114] 114 (03/12 0453) Resp:  [16-20] 20 (03/11 1649) BP: (128-172)/(73-98) 168/90 (03/12 0453) SpO2:  [97 %-100 %] 99 % (03/12 0453)  Intake/Output from previous day: 03/11 0701 - 03/12 0700 In: -  Out: 650 [Urine:650] Intake/Output this shift: No intake/output data recorded. Nutritional status: Fall precautions DIET DYS 3 Room service appropriate? Yes; Fluid consistency: Thin  Neurological Exam Mental Status: Somnolent (received Ativan this AM after being awake and agitated for most of the night). When aroused, she again exhibits prominent receptive aphasia with ability to follow approximately 20% of simple motor commands. Frequent word salad with some discernible phrases. Dysarthric. In context of somnolence, today she is nont able to answer some simple questions.  Cranial Nerves:  III,IV, VI: EOM are full with some hesitancy in rightward gaze.  VII: Facial movement is symmetric.  VIII: hearing is intact to voice Motor: 5/5 strengthLUE and LLE. No movement of RUE to noxious stimuli or spontaneously. 2/5 movement of RLE to noxious plantar stimulation.  Lab Results: Results for orders placed or performed during the hospital encounter of 10/05/17 (from the past 48 hour(s))  CBG monitoring, ED     Status: None   Collection Time: 10/05/17 12:31 PM  Result Value Ref Range   Glucose-Capillary 77 65 - 99 mg/dL  Protime-INR     Status: None   Collection Time: 10/05/17 12:50 PM   Result Value Ref Range   Prothrombin Time 13.2 11.4 - 15.2 seconds   INR 1.01     Comment: Performed at [facility], [address] [place], [place] [address]  APTT     Status: None   Collection Time: 10/05/17 12:50 PM  Result Value Ref Range   aPTT 29 24 - 36 seconds    Comment: Performed at [facility], [address] [place], [place] [address]  CBC     Status: None   Collection Time: 10/05/17 12:50 PM  Result Value Ref Range   WBC 7.8 4.0 - 10.5 K/uL   RBC 4.46 3.87 - 5.11 MIL/uL   Hemoglobin 12.9 12.0 - 15.0 g/dL   HCT 38.3 36.0 - 46.0 %   MCV 85.9 78.0 - 100.0 fL   MCH 28.9 26.0 - 34.0 pg   MCHC 33.7 30.0 - 36.0 g/dL   RDW 13.4 11.5 - 15.5 %   Platelets 199 150 - 400 K/uL    Comment: Performed at [facility], [address] [place], [place] [address]  Differential     Status: None   Collection Time: 10/05/17 12:50 PM  Result Value Ref Range   Neutrophils Relative % 50 %   Neutro Abs 3.9 1.7 - 7.7 K/uL   Lymphocytes Relative 39 %   Lymphs Abs 3.1 0.7 - 4.0 K/uL   Monocytes Relative 8 %   Monocytes Absolute 0.6 0.1 - 1.0  K/uL   Eosinophils Relative 3 %   Eosinophils Absolute 0.2 0.0 - 0.7 K/uL   Basophils Relative 0 %   Basophils Absolute 0.0 0.0 - 0.1 K/uL    Comment: Performed at [facility], [address] [place], [place] [address]  Comprehensive metabolic panel     Status: Abnormal   Collection Time: 10/05/17 12:50 PM  Result Value Ref Range   Sodium 139 135 - 145 mmol/L   Potassium 3.4 (L) 3.5 - 5.1 mmol/L   Chloride 105 101 - 111 mmol/L   CO2 23 22 - 32 mmol/L   Glucose, Bld 99 65 - 99 mg/dL   BUN 12 6 - 20 mg/dL   Creatinine, Ser 0.75 0.44 - 1.00 mg/dL   Calcium 9.6 8.9 - 10.3 mg/dL   Total Protein 7.7 6.5 - 8.1 g/dL   Albumin 3.8 3.5 - 5.0 g/dL   AST 38 15 - 41 U/L   ALT 20 14 - 54 U/L   Alkaline Phosphatase 68 38 - 126 U/L   Total Bilirubin 0.7 0.3 - 1.2 mg/dL   GFR calc non Af Amer >60 >60 mL/min    GFR calc Af Amer >60 >60 mL/min    Comment: (NOTE) The eGFR has been calculated using the CKD EPI equation. This calculation has not been validated in all clinical situations. eGFR's persistently <60 mL/min signify possible Chronic Kidney Disease.    Anion gap 11 5 - 15    Comment: Performed at [facility], [address] [place], [place] [address]  Troponin I     Status: None   Collection Time: 10/05/17 12:50 PM  Result Value Ref Range   Troponin I <0.03 <0.03 ng/mL    Comment: Performed at [facility], [address] [place], [place] [address]  Hemoglobin A1c     Status: None   Collection Time: 10/05/17  7:23 PM  Result Value Ref Range   Hgb A1c MFr Bld 5.0 4.8 - 5.6 %    Comment: (NOTE) Pre diabetes:          5.7%-6.4% Diabetes:              >6.4% Glycemic control for   <7.0% adults with diabetes    Mean Plasma Glucose 96.8 mg/dL    Comment: Performed at [facility] [address] [place], [place] [address]  Phenytoin level, total     Status: None   Collection Time: 10/06/17  6:46 AM  Result Value Ref Range   Phenytoin Lvl 18.1 10.0 - 20.0 ug/mL    Comment: Performed at [facility] [address] [place], [place] [address]  Hemoglobin A1c     Status: None   Collection Time: 10/06/17  6:46 AM  Result Value Ref Range   Hgb A1c MFr Bld 5.0 4.8 - 5.6 %    Comment: (NOTE) Pre diabetes:          5.7%-6.4% Diabetes:              >6.4% Glycemic control for   <7.0% adults with diabetes    Mean Plasma Glucose 96.8 mg/dL    Comment: Performed at [facility] [address] [place],  [address]  Lipid panel     Status: None   Collection Time: 10/06/17  6:46 AM  Result Value Ref Range   Cholesterol 147 0 - 200 mg/dL   Triglycerides 64 <150 mg/dL  HDL 58 >40 mg/dL   Total CHOL/HDL Ratio 2.5 RATIO   VLDL 13 0 - 40 mg/dL   LDL Cholesterol 76 0 - 99 mg/dL    Comment:        Total Cholesterol/HDL:CHD Risk Coronary  Heart Disease Risk Table                     Men   Women  1/2 Average Risk   3.4   3.3  Average Risk       5.0   4.4  2 X Average Risk   9.6   7.1  3 X Average Risk  23.4   11.0        Use the calculated Patient Ratio above and the CHD Risk Table to determine the patient's CHD Risk.        ATP III CLASSIFICATION (LDL):  <100     mg/dL   Optimal  100-129  mg/dL   Near or Above                    Optimal  130-159  mg/dL   Borderline  160-189  mg/dL   High  >190     mg/dL   Very High Performed at [facility] [address], [place], [place] [address]     No results found for this or any previous visit (from the past 240 hour(s)).  Lipid Panel Recent Labs    10/06/17 0646  CHOL 147  TRIG 64  HDL 58  CHOLHDL 2.5  VLDL 13  LDLCALC 76    Studies/Results: Ct Angio Head W Or Wo Contrast  Result Date: 10/05/2017 CLINICAL DATA:  Slurred speech.  Prior stroke. EXAM: CT ANGIOGRAPHY HEAD AND NECK CT PERFUSION BRAIN TECHNIQUE: Multidetector CT imaging of the head and neck was performed using the standard protocol during bolus administration of intravenous contrast. Multiplanar CT image reconstructions and MIPs were obtained to evaluate the vascular anatomy. Carotid stenosis measurements (when applicable) are obtained utilizing NASCET criteria, using the distal internal carotid diameter as the denominator. Multiphase CT imaging of the brain was performed following IV bolus contrast injection. Subsequent parametric perfusion maps were calculated using RAPID software. CONTRAST:  29m ISOVUE-370 IOPAMIDOL (ISOVUE-370) INJECTION 76% COMPARISON:  None. FINDINGS: CTA NECK FINDINGS Aortic arch: Normal variant aortic arch branching pattern with common origin of brachiocephalic and left common carotid arteries. Mild arch atherosclerosis. Widely patent arch vessel origins. Right carotid system: Patent with mild calcified and soft plaque at the carotid bifurcation. No significant stenosis or  evidence of dissection. Left carotid system: Patent with minimal calcified plaque at the carotid bifurcation. No stenosis or evidence of dissection. Vertebral arteries: Patent and codominant without evidence of stenosis or dissection. Skeleton: Remote, solid interbody osseous fusion at C4-5. Bilateral C4-5 facet ankylosis. Multilevel disc degeneration, advanced at C5-6 and C6-7. Bulky posterior spurring/disc osteophyte complex at C5-6 results in moderate spinal stenosis. Other neck: No mass or enlarged lymph nodes. Upper chest: Clear lung apices. Review of the MIP images confirms the above findings CTA HEAD FINDINGS Anterior circulation: The internal carotid arteries are patent from skull base to carotid termini with mild nonstenotic siphon atherosclerosis bilaterally. ACAs and MCAs are patent without evidence of proximal branch occlusion or significant proximal stenosis. No aneurysm or vascular malformation. Posterior circulation: The intracranial vertebral arteries are widely patent to the basilar. PICAs and SCAs are unremarkable. AICAs are small and not well evaluated. The basilar artery is widely patent.  There is a small right posterior communicating artery. PCAs are patent without evidence of significant stenosis. No aneurysm or vascular malformation. Venous sinuses: As permitted by contrast timing, patent. Anatomic variants: None. Delayed phase: Not performed. Review of the MIP images confirms the above findings CT Brain Perfusion Findings: CBF (<30%) Volume: 58m Perfusion (Tmax>6.0s) volume: 035mMismatch Volume: n/a Infarction Location: n/a There is increased cerebral blood volume and blood flow diffusely throughout cortex in the posterior left temporal, left occipital, and left parietal lobes in the setting of an old high left parietal cortical infarct. IMPRESSION: 1. No large vessel occlusion. 2. Mild intracranial and cervical atherosclerosis without significant stenosis or aneurysm. 3. No evidence of acute  ischemia on perfusion imaging. 4. Increased cortical blood flow throughout the posterior left cerebral hemisphere, query seizure activity in the setting of an old left parietal cortical infarct. These results were called by telephone at the time of interpretation on 10/05/2017 at 2:43 pm to Dr. MCRoland Rack who verbally acknowledged these results. Electronically Signed   By: AlLogan Bores.D.   On: 10/05/2017 14:58   Ct Angio Neck W Or Wo Contrast  Result Date: 10/05/2017 CLINICAL DATA:  Slurred speech.  Prior stroke. EXAM: CT ANGIOGRAPHY HEAD AND NECK CT PERFUSION BRAIN TECHNIQUE: Multidetector CT imaging of the head and neck was performed using the standard protocol during bolus administration of intravenous contrast. Multiplanar CT image reconstructions and MIPs were obtained to evaluate the vascular anatomy. Carotid stenosis measurements (when applicable) are obtained utilizing NASCET criteria, using the distal internal carotid diameter as the denominator. Multiphase CT imaging of the brain was performed following IV bolus contrast injection. Subsequent parametric perfusion maps were calculated using RAPID software. CONTRAST:  9062mSOVUE-370 IOPAMIDOL (ISOVUE-370) INJECTION 76% COMPARISON:  None. FINDINGS: CTA NECK FINDINGS Aortic arch: Normal variant aortic arch branching pattern with common origin of brachiocephalic and left common carotid arteries. Mild arch atherosclerosis. Widely patent arch vessel origins. Right carotid system: Patent with mild calcified and soft plaque at the carotid bifurcation. No significant stenosis or evidence of dissection. Left carotid system: Patent with minimal calcified plaque at the carotid bifurcation. No stenosis or evidence of dissection. Vertebral arteries: Patent and codominant without evidence of stenosis or dissection. Skeleton: Remote, solid interbody osseous fusion at C4-5. Bilateral C4-5 facet ankylosis. Multilevel disc degeneration, advanced at C5-6 and  C6-7. Bulky posterior spurring/disc osteophyte complex at C5-6 results in moderate spinal stenosis. Other neck: No mass or enlarged lymph nodes. Upper chest: Clear lung apices. Review of the MIP images confirms the above findings CTA HEAD FINDINGS Anterior circulation: The internal carotid arteries are patent from skull base to carotid termini with mild nonstenotic siphon atherosclerosis bilaterally. ACAs and MCAs are patent without evidence of proximal branch occlusion or significant proximal stenosis. No aneurysm or vascular malformation. Posterior circulation: The intracranial vertebral arteries are widely patent to the basilar. PICAs and SCAs are unremarkable. AICAs are small and not well evaluated. The basilar artery is widely patent. There is a small right posterior communicating artery. PCAs are patent without evidence of significant stenosis. No aneurysm or vascular malformation. Venous sinuses: As permitted by contrast timing, patent. Anatomic variants: None. Delayed phase: Not performed. Review of the MIP images confirms the above findings CT Brain Perfusion Findings: CBF (<30%) Volume: 0mL48mrfusion (Tmax>6.0s) volume: 0mL 71mmatch Volume: n/a Infarction Location: n/a There is increased cerebral blood volume and blood flow diffusely throughout cortex in the posterior left temporal, left occipital, and left parietal lobes in the  setting of an old high left parietal cortical infarct. IMPRESSION: 1. No large vessel occlusion. 2. Mild intracranial and cervical atherosclerosis without significant stenosis or aneurysm. 3. No evidence of acute ischemia on perfusion imaging. 4. Increased cortical blood flow throughout the posterior left cerebral hemisphere, query seizure activity in the setting of an old left parietal cortical infarct. These results were called by telephone at the time of interpretation on 10/05/2017 at 2:43 pm to Dr. Roland Rack , who verbally acknowledged these results. Electronically  Signed   By: Logan Bores M.D.   On: 10/05/2017 14:58   Ct Cerebral Perfusion W Contrast  Result Date: 10/05/2017 CLINICAL DATA:  Slurred speech.  Prior stroke. EXAM: CT ANGIOGRAPHY HEAD AND NECK CT PERFUSION BRAIN TECHNIQUE: Multidetector CT imaging of the head and neck was performed using the standard protocol during bolus administration of intravenous contrast. Multiplanar CT image reconstructions and MIPs were obtained to evaluate the vascular anatomy. Carotid stenosis measurements (when applicable) are obtained utilizing NASCET criteria, using the distal internal carotid diameter as the denominator. Multiphase CT imaging of the brain was performed following IV bolus contrast injection. Subsequent parametric perfusion maps were calculated using RAPID software. CONTRAST:  34m ISOVUE-370 IOPAMIDOL (ISOVUE-370) INJECTION 76% COMPARISON:  None. FINDINGS: CTA NECK FINDINGS Aortic arch: Normal variant aortic arch branching pattern with common origin of brachiocephalic and left common carotid arteries. Mild arch atherosclerosis. Widely patent arch vessel origins. Right carotid system: Patent with mild calcified and soft plaque at the carotid bifurcation. No significant stenosis or evidence of dissection. Left carotid system: Patent with minimal calcified plaque at the carotid bifurcation. No stenosis or evidence of dissection. Vertebral arteries: Patent and codominant without evidence of stenosis or dissection. Skeleton: Remote, solid interbody osseous fusion at C4-5. Bilateral C4-5 facet ankylosis. Multilevel disc degeneration, advanced at C5-6 and C6-7. Bulky posterior spurring/disc osteophyte complex at C5-6 results in moderate spinal stenosis. Other neck: No mass or enlarged lymph nodes. Upper chest: Clear lung apices. Review of the MIP images confirms the above findings CTA HEAD FINDINGS Anterior circulation: The internal carotid arteries are patent from skull base to carotid termini with mild nonstenotic  siphon atherosclerosis bilaterally. ACAs and MCAs are patent without evidence of proximal branch occlusion or significant proximal stenosis. No aneurysm or vascular malformation. Posterior circulation: The intracranial vertebral arteries are widely patent to the basilar. PICAs and SCAs are unremarkable. AICAs are small and not well evaluated. The basilar artery is widely patent. There is a small right posterior communicating artery. PCAs are patent without evidence of significant stenosis. No aneurysm or vascular malformation. Venous sinuses: As permitted by contrast timing, patent. Anatomic variants: None. Delayed phase: Not performed. Review of the MIP images confirms the above findings CT Brain Perfusion Findings: CBF (<30%) Volume: 072mPerfusion (Tmax>6.0s) volume: 22m54mismatch Volume: n/a Infarction Location: n/a There is increased cerebral blood volume and blood flow diffusely throughout cortex in the posterior left temporal, left occipital, and left parietal lobes in the setting of an old high left parietal cortical infarct. IMPRESSION: 1. No large vessel occlusion. 2. Mild intracranial and cervical atherosclerosis without significant stenosis or aneurysm. 3. No evidence of acute ischemia on perfusion imaging. 4. Increased cortical blood flow throughout the posterior left cerebral hemisphere, query seizure activity in the setting of an old left parietal cortical infarct. These results were called by telephone at the time of interpretation on 10/05/2017 at 2:43 pm to Dr. MCNRoland Rackwho verbally acknowledged these results. Electronically Signed   By:  Logan Bores M.D.   On: 10/05/2017 14:58   Ct Head Code Stroke Wo Contrast  Result Date: 10/05/2017 CLINICAL DATA:  Code stroke. Slurred speech. Prior stroke with residual right-sided deficits. EXAM: CT HEAD WITHOUT CONTRAST TECHNIQUE: Contiguous axial images were obtained from the base of the skull through the vertex without intravenous contrast.  COMPARISON:  01/31/2005 FINDINGS: Brain: There is no evidence of acute infarct, intracranial hemorrhage, mass, midline shift, or extra-axial fluid collection. There is a chronic left frontoparietal cortical and subcortical infarct near the vertex which is new from the prior study. Mild lateral and third ventriculomegaly is new and felt to reflect central predominant cerebral atrophy rather than hydrocephalus. Periventricular white matter hypodensities are nonspecific but compatible with mild chronic small vessel ischemic disease. A chronic subcentimeter infarct in the left cerebellum is new. Vascular: Calcified atherosclerosis at the skull base. No hyperdense vessel. Skull: No fracture focal osseous lesion. Sinuses/Orbits: Tiny left maxillary sinus mucous retention cyst. Clear mastoid air cells. Unremarkable orbits. Other: None. ASPECTS [facility] Stroke Program Early CT Score) - Ganglionic level infarction (caudate, lentiform nuclei, internal capsule, insula, M1-M3 cortex): 7 - Supraganglionic infarction (M4-M6 cortex): 3 Total score (0-10 with 10 being normal): 10 IMPRESSION: 1. No evidence of acute intracranial abnormality. 2. ASPECTS is 10. 3. 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 4. Mild chronic small vessel ischemic disease and cerebral atrophy. These results were called by telephone at the time of interpretation on 10/05/2017 at 12:49 pm to Dr. Blanchie Dessert , who verbally acknowledged these results. Electronically Signed   By: Logan Bores M.D.   On: 10/05/2017 12:50    Medications:  Scheduled: . aspirin EC  81 mg Oral Daily  . atorvastatin  10 mg Oral q1800  . cholecalciferol  1,000 Units Oral q morning - 10a  . enoxaparin (LOVENOX) injection  40 mg Subcutaneous Q24H  . ferrous sulfate  325 mg Oral Q breakfast  . levothyroxine  100 mcg Oral Daily  . mirtazapine  15 mg Oral QHS  . phenytoin (DILANTIN) IV  100 mg Intravenous Q8H  . pregabalin  25 mg Oral Daily  .  senna-docusate  1 tablet Oral BID   Continuous: . lacosamide (VIMPAT) IV Stopped (10/06/17 2240)  . levETIRAcetam 2,000 mg (10/07/17 0511)   IWL:[id number] **OR** acetaminophen (TYLENOL) oral liquid 160 mg/5 mL **OR** acetaminophen, hydrALAZINE, senna-docusate  EEG 3/10:  1)left posterior quadrant sharp waves 2)left posterior quadrant slow activity Clinical Interpretation: ThisEEG is consistent with an area of epileptogenic potential in the left posterior quadrant. There was no seizure recorded on this study.   Assessment: 67 year old female with a history of intraparenchymal  hematoma who presented with aphasia and right-sided weakness at [facility] in January and was felt to have had seizure activity as a cause.  She presented with a very similar presentation on Sunday. EEG showed significant cortical irritability but no ongoing seizure. She was loaded with Dilantin in addition to her home Keppra. 1. Today's exam with continued aphasia. No adventitious movements to suggest ongoing motor seizure, but focal seizure involving speech is high on the DDx. 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 Of note, CTA/CTP on 3/10 showed increased cortical blood flow throughout the posterior left cerebral hemisphere,  suggestive of physiological response to seizure activity. 2. History of left parietal cortical infarction 3. Possible new onset of atrial fibrillation 4. Dilantin level from yesterday was therapeutic at 18.1  Recommendations: 1. Continue Keppra at home dose of 2000 mg BID (may have been taking incorrectly at lower dose at home).  2. Continue Dilantin at 100 mg TID.  3. Continue Vimpat  100 mg BID - started yesterday.  4. Will need to have LTM placed to further evaluate.  5. Pending results of LTM EEG, may need to have a 4th anticonvulsant added to her regimen if recurrent seizures are seen on the tracings. Alternatively, if the EEG is more consistent with somnolence, may need to decrease her Keppra dosage.     LOS: 2 days   _0  signed: Dr. Kerney Elbe 10/07/2017  8:05 AM

## 2017-10-07 NOTE — Progress Notes (Signed)
PT Cancellation Note  Patient Details Name: Kathleen Jensen MRN: [id number] DOB: [birth year]   Cancelled Treatment:    Reason Eval/Treat Not Completed: Fatigue/lethargy limiting ability to participate;Patient's level of consciousness. Unable to rouse pt. Pt unresponsive to noise or sternal rub. VSS, RN notified.   Kallie LocksHannah Sama Arauz, PTA Pager [phone] Acute Rehab   Sheral ApleyHannah E Onyinyechi Huante 10/07/2017, 1:02 PM

## 2017-10-07 NOTE — Progress Notes (Signed)
  Speech Language Pathology Treatment: Dysphagia;Cognitive-Linquistic  Patient Details Name: Kathleen Jensen MRN: [id number] DOB: 23-Oct-1950 Today's Date: 10/07/2017 Time: 0810-0830 SLP Time Calculation (min) (ACUTE ONLY): 20 min  Assessment / Plan / Recommendation Clinical Impression  Skilled treatment session focused on dysphagia and cognition goals. Pt required Mod A cues and physical support to maintain upright posture and safety within bed. Pt consumed 3 oz thin water via cup with no overt s/s of aspiration. Pt with verbal attempts to communicate but is largely unintelligible d/t garbled/slurred speech likely d/t ativan. ST will continue to follow for safe PO intake and advancing communication function.    HPI HPI: 67 year old Female admitted 10/05/17 with sudden onset of aphasia. PMH significant for CKD3, ICH with right hemi (October 2018), HTN, HLD, seizure (January 2019). Head CT = negative for acute abnormalities, chronic Left frontal/parietal and cerebellar infarcts.      SLP Plan  Continue with current plan of care       Recommendations  Diet recommendations: Dysphagia 3 (mechanical soft);Thin liquid Liquids provided via: Cup Medication Administration: Whole meds with liquid Supervision: Staff to assist with self feeding;Full supervision/cueing for compensatory strategies Compensations: Minimize environmental distractions;Small sips/bites Postural Changes and/or Swallow Maneuvers: Seated upright 90 degrees                Oral Care Recommendations: Oral care BID Follow up Recommendations: 24 hour supervision/assistance SLP Visit Diagnosis: Dysphagia, unspecified (R13.10);Cognitive communication deficit (R41.841) Plan: Continue with current plan of care       GO                Daymond Cordts 10/07/2017, 9:01 AM

## 2017-10-07 NOTE — Progress Notes (Signed)
Patient has been verbal all through the night.  Seems to calm when someone is in the room. Have changed patient's pad at least 10 times. IV redressed. Bed alarm on. Safety maintained.

## 2017-10-07 NOTE — Progress Notes (Signed)
Pt active with [facility] home [facility].

## 2017-10-07 NOTE — Progress Notes (Signed)
Patient given ativan. Does not appear to help. Patient keeps calling out, very difficult to understand. She has had a purewick, bedpan and bedside commode, and she just cannot get. Comfortable.

## 2017-10-07 NOTE — Progress Notes (Signed)
Patient appears more alert at this time, able to verbalized name and where she is. Pt declined dinner and stated "I'm gonna eat". Will continue to monitor.   Sim BoastHavy, RN

## 2017-10-07 NOTE — Progress Notes (Signed)
Patient screaming. Entered room. Patient states "that thing hurts" talking about the purewick. Removed purewick, placed patient on bedpad, voided very small amount. Repositioned, encouraged. Bladder scanned patient. 150 cc at this time. Container had 400 cc of yellow, concentrated urine. Emotional support given. Bed alarm on. Safety maintained. Will continue to monitor.

## 2017-10-07 NOTE — Progress Notes (Signed)
PROGRESS NOTE  Kathleen Jensen WUJ:[id number] DOB: 1950/12/30 DOA: 10/05/2017 PCP: Donato Schultz, DO  HPI/Recap of past 24 hours: Kathleen Jensen is a 67 y.o. 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 She is evaluated by neurology who opined subclinical seizures. [facility] hospitalists are asked to admit.  10/06/2017: Patient seen and examined at her bedside.  Expressive aphasia is persistent.  She appears comfortable. Right upper and lower extremities weakness.  No new complaints.  10/07/2017: Patient seen and examined at bedside.  Very somnolent this morning but easily arouses to voices.  Does not appear to be in distress.  Vital signs are stable.  Has no new complaints.  Continuous EEG planned today.    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  Greatly appreciated. -EEG done on 10/05/2017 revealed: Left posterior quadrant sharp waves and left posterior quadrant slow activity -Continue Keppra 2000 mg twice daily, Dilantin 100 mg 3 times daily, load Zantac 20 mg IV once daily continue other 100 mg twice daily as recommended by neurology. -Continuous EEG planned today Tuesday 10/07/17 to assess for improvement in the left posterior  quadrant sharp waves left posterior quadrants no activity following the addition of Vimpat.  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  Chronic constipation -Continue Senokot    Code Status: Full  Family Communication: None at bedside  Disposition Plan: PT recommends home health PT.  Discharge to home with home health services tomorrow after neurology signs off 10/07/2017.  Patient seen..  Consultants:  Neurology  Procedures:  None  Antimicrobials:  None  DVT prophylaxis: SCDs   Objective: Vitals:   10/07/17 0100 10/07/17 0453 10/07/17 0850 10/07/17 1147  BP: (!) 172/84 (!) 168/90 120/85 118/74  Pulse: 90 (!) 114 80 77  Resp:   20   Temp: 98.3 F (36.8 C) 98.3 F (36.8 C) 98.9 F (37.2 C) 98.4 F (36.9 C)  TempSrc: Oral Oral Oral Oral  SpO2: 99% 99% 97% 99%  Weight:      Height:        Intake/Output Summary (Last 24 hours) at 10/07/2017 1544 Last data filed at 10/07/2017 0145 Gross per 24 hour  Intake -  Output 650 ml  Net -650 ml   Filed Weights   10/05/17 1746  Weight: 83.7 kg (184 lb 8.4 oz)    Exam: 10/07/2017.  Patient seen and examined.  Physical exam is essentially the same except for what is mentioned below.   General: 67 year old African-American female obese lying in bed in no apparent acute distress.  Somnolent.  Cardiovascular: Regular rate and rhythm with no rubs or  gallops  Respiratory: Clear to auscultation with no wheezes or rales  Abdomen: Obese nontender nondistended normal bowel sounds x 4  Musculoskeletal: Right upper and right lower  extremity weakness compared to left  Skin: No rash noted  Psychiatry: Unable to assess mood due to somnolence  Data Reviewed: CBC: Recent Labs  Lab 10/05/17 1250  WBC 7.8  NEUTROABS 3.9  HGB 12.9  HCT 38.3  MCV 85.9  PLT 199   Basic Metabolic Panel: Recent Labs  Lab 10/05/17 1250  NA 139  K 3.4*  CL 105  CO2 23  GLUCOSE 99  BUN 12  CREATININE 0.75  CALCIUM 9.6   GFR: Estimated Creatinine Clearance: 71.4 mL/min (by C-G formula based on SCr of 0.75 mg/dL). Liver Function Tests: Recent Labs  Lab 10/05/17 1250  AST 38  ALT 20  ALKPHOS 68  BILITOT 0.7  PROT 7.7  ALBUMIN 3.8   No results for input(s): LIPASE, AMYLASE in the last 168 hours. No results for input(s): AMMONIA in the last 168 hours. Coagulation Profile: Recent Labs  Lab 10/05/17 1250  INR 1.01   Cardiac Enzymes: Recent Labs  Lab 10/05/17 1250  TROPONINI <0.03   BNP (last 3 results) No results for input(s): PROBNP in the last 8760 hours. HbA1C: Recent Labs    10/05/17 1923 10/06/17 0646  HGBA1C 5.0 5.0   CBG: Recent Labs  Lab 10/05/17 1231 10/07/17 1139  GLUCAP 77 144*   Lipid Profile: Recent Labs    10/06/17 0646  CHOL 147  HDL 58  LDLCALC 76  TRIG 64  CHOLHDL 2.5   Thyroid Function Tests: No results for input(s): TSH, T4TOTAL, FREET4, T3FREE, THYROIDAB in the last 72 hours. Anemia Panel: No results for input(s): VITAMINB12, FOLATE, FERRITIN, TIBC, IRON, RETICCTPCT in the last 72 hours. Urine analysis:    Component Value Date/Time   BILIRUBINUR 1+ 11/19/2016 1307   PROTEINUR trace 11/19/2016 1307   UROBILINOGEN 1.0 11/19/2016 1307   NITRITE positive 11/19/2016 1307   LEUKOCYTESUR Negative 11/19/2016 1307   Sepsis Labs: @LABRCNTIP (procalcitonin:4,lacticidven:4)  )No results found for this or any previous visit (from the past 240 hour(s)).    Studies: No results found.  Scheduled Meds: . aspirin EC  81 mg Oral Daily  . atorvastatin  10 mg Oral q1800  .  cholecalciferol  1,000 Units Oral q morning - 10a  . enoxaparin (LOVENOX) injection  40 mg Subcutaneous Q24H  . ferrous sulfate  325 mg Oral Q breakfast  . levothyroxine  100 mcg Oral Daily  . mirtazapine  15 mg Oral QHS  . phenytoin (DILANTIN) IV  100 mg Intravenous Q8H  . pregabalin  25 mg Oral Daily  . senna-docusate  1 tablet Oral BID    Continuous Infusions: . lacosamide (VIMPAT) IV Stopped (10/07/17 1216)  . levETIRAcetam 2,000 mg (10/07/17 0511)     LOS: 2 days     Darlin Droparole N Oluwanifemi Petitti, MD [facility] Pager [phone]  If 7PM-7AM, please contact night-coverage [email or web address] Password [email or web address] 10/07/2017, 3:44 PM

## 2017-10-07 NOTE — Progress Notes (Addendum)
Patient noted somnolent at this time from this AM, report from off going RN that pt appeared restless and awake most of night shift, ativan was given approx 0300. VSS. BG stable. MD notified of change.   Sim BoastHavy, RN

## 2017-10-07 NOTE — Progress Notes (Signed)
LTM running. 

## 2017-10-07 NOTE — Progress Notes (Signed)
One attempt to start a PIV was made unsuccessfully however, the patient is being set up for another bedside procedure at this time which will last 1 hour. I asked the nurse to put in another IVT consult once the procedure is completed and we'll make another attempt to get IV access. Consuello Masseimmons, Mirko Tailor M

## 2017-10-07 NOTE — Progress Notes (Signed)
Paged [facility] to see if we could give patient something for anxiety.Awaiting call back.

## 2017-10-08 LAB — GLUCOSE, CAPILLARY
GLUCOSE-CAPILLARY: 91 mg/dL (ref 65–99)
GLUCOSE-CAPILLARY: 88 mg/dL (ref 65–99)
Glucose-Capillary: 66 mg/dL (ref 65–99)
Glucose-Capillary: 97 mg/dL (ref 65–99)
Glucose-Capillary: 139 mg/dL — ABNORMAL HIGH (ref 65–99)

## 2017-10-08 MED ORDER — DEXTROSE 50 % IV SOLN
INTRAVENOUS | Status: AC
  Filled 2017-10-08: qty 50

## 2017-10-08 MED ORDER — SODIUM CHLORIDE 0.9 % IV SOLN
200.0000 mg | Freq: Two times a day (BID) | INTRAVENOUS | Status: DC
  Administered 2017-10-08 – 2017-10-14 (×13): 200 mg via INTRAVENOUS
  Filled 2017-10-08 (×17): qty 20

## 2017-10-08 MED ORDER — SODIUM CHLORIDE 0.9 % IV SOLN
100.0000 mg | Freq: Once | INTRAVENOUS | Status: AC
  Administered 2017-10-08: 100 mg via INTRAVENOUS
  Filled 2017-10-08: qty 10

## 2017-10-08 NOTE — Progress Notes (Signed)
OT Cancellation Note  Patient Details Name: Kathleen Jensen MRN: [id number] DOB: 1950-11-10   Cancelled Treatment:    Reason Eval/Treat Not Completed: Other (comment). Pt on continuous EEG for the next 24 hours. Plan check in with pt tomorrow and reattempt OT session once pt no longer on EEG.   Raynald KempKathryn Kirra Verga OTR/L Pager: [phone]  10/08/2017, 10:42 AM

## 2017-10-08 NOTE — Progress Notes (Signed)
PROGRESS NOTE  Marlaine Arey ZOX:[id number] DOB: [birth year] DOA: 10/05/2017 PCP: Donato Schultz, DO  HPI/Recap of past 24 hours: Brylea Pita is a 67 y.o. 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 She is evaluated by neurology who opined subclinical seizures. [facility]ists are asked to admit.  10/06/2017: Patient seen and examined at her bedside.  Expressive aphasia is persistent.  She appears comfortable. Right upper and lower extremities weakness.  No new complaints.  10/07/2017: Patient seen and examined at bedside.  Very somnolent this morning but easily arouses to voices.  Does not appear to be in distress.  Vital signs are stable.  Has no new complaints.  Continuous EEG planned today.  10/08/2017: Patient seen and examined with her RN at her bedside.  No new complaints.  Continuous EEG in progress.  The writer spoke with the patient's daughter regarding placement and updated her about the patient's current medical condition.     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  Greatly appreciated. 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  Chronic constipation -Continue Senokot  Obesity BMI 31 Weight loss when more stable outpatient   Code Status: Full  Family Communication: None at bedside  Disposition Plan: PT recommends home health PT.  Discharge to home with home health services after neurology signs off.    Consultants:  Neurology  Speech therapy  Procedures:  None  Antimicrobials:  None  DVT prophylaxis: SCDs, Lovenox subcu daily   Objective: Vitals:   10/08/17 0024 10/08/17 0317 10/08/17 0812 10/08/17 1137  BP: (!) 105/59 122/64 115/83 134/86  Pulse: 68 63 81 78  Resp: 19     Temp: (!) 97.5 F (36.4 C) 98.7 F (37.1 C) 98.2 F (36.8 C) 99.1 F (37.3 C)  TempSrc: Oral Oral Oral Oral  SpO2: 98% 98% 91% 99%  Weight:      Height:        Intake/Output Summary (Last 24 hours) at 10/08/2017 1450 Last data filed at 10/08/2017 1200 Gross per 24 hour  Intake 750 ml  Output -  Net 750 ml   Filed Weights   10/05/17 1746  Weight: 83.7 kg (184 lb  8.4 oz)    Exam: 10/08/2017.  Patient seen and examined.  Physical exam is essentially the same except for what is mentioned below.   General: 67 year old African-American female obese lying in bed  in no apparent acute distress.  Alert with expressive aphasia and difficult to understand.  Cardiovascular: Regular rate and rhythm with no rubs or gallops  Respiratory: Clear to auscultation with no wheezes or rales  Abdomen: Obese nontender nondistended normal bowel sounds x 4  Musculoskeletal: Right upper and right lower extremity weakness compared to left  Skin: No rash noted  Psychiatry: Mood is appropriate for condition and setting.  Data Reviewed: CBC: Recent Labs  Lab 10/05/17 1250  WBC 7.8  NEUTROABS 3.9  HGB 12.9  HCT 38.3  MCV 85.9  PLT 199   Basic Metabolic Panel: Recent Labs  Lab 10/05/17 1250  NA 139  K 3.4*  CL 105  CO2 23  GLUCOSE 99  BUN 12  CREATININE 0.75  CALCIUM 9.6   GFR: Estimated Creatinine Clearance: 71.4 mL/min (by C-G formula based on SCr of 0.75 mg/dL). Liver Function Tests: Recent Labs  Lab 10/05/17 1250  AST 38  ALT 20  ALKPHOS 68  BILITOT 0.7  PROT 7.7  ALBUMIN 3.8   No results for input(s): LIPASE, AMYLASE in the last 168 hours. No results for input(s): AMMONIA in the last 168 hours. Coagulation Profile: Recent Labs  Lab 10/05/17 1250  INR 1.01   Cardiac Enzymes: Recent Labs  Lab 10/05/17 1250  TROPONINI <0.03   BNP (last 3 results) No results for input(s): PROBNP in the last 8760 hours. HbA1C: Recent Labs    10/05/17 1923 10/06/17 0646  HGBA1C 5.0 5.0   CBG: Recent Labs  Lab 10/07/17 1613 10/07/17 2116 10/08/17 0613 10/08/17 0739 10/08/17 1140  GLUCAP 120* 93 66 97 91   Lipid Profile: Recent Labs    10/06/17 0646  CHOL 147  HDL 58  LDLCALC 76  TRIG 64  CHOLHDL 2.5   Thyroid Function Tests: No results for input(s): TSH, T4TOTAL, FREET4, T3FREE, THYROIDAB in the last 72 hours. Anemia Panel: No results for input(s): VITAMINB12, FOLATE, FERRITIN, TIBC, IRON, RETICCTPCT in the last 72 hours. Urine analysis:    Component Value Date/Time   BILIRUBINUR 1+ 11/19/2016 1307   PROTEINUR trace  11/19/2016 1307   UROBILINOGEN 1.0 11/19/2016 1307   NITRITE positive 11/19/2016 1307   LEUKOCYTESUR Negative 11/19/2016 1307   Sepsis Labs: @LABRCNTIP (procalcitonin:4,lacticidven:4)  )No results found for this or any previous visit (from the past 240 hour(s)).    Studies: No results found.  Scheduled Meds: . aspirin EC  81 mg Oral Daily  . atorvastatin  10 mg Oral q1800  . cholecalciferol  1,000 Units Oral q morning - 10a  . enoxaparin (LOVENOX) injection  40 mg Subcutaneous Q24H  . ferrous sulfate  325 mg Oral Q breakfast  . levothyroxine  100 mcg Oral Daily  . mirtazapine  15 mg Oral QHS  . phenytoin (DILANTIN) IV  100 mg Intravenous Q8H  . pregabalin  25 mg Oral Daily  . senna-docusate  1 tablet Oral BID    Continuous Infusions: . lacosamide (VIMPAT) IV    . levETIRAcetam Stopped (10/08/17 0447)     LOS: 3 days     Darlin Droparole N Jaylei Fuerte, MD [facility] Pager [phone]  If 7PM-7AM, please contact night-coverage [email or web address] Password [email or web address] 10/08/2017, 2:50 PM

## 2017-10-08 NOTE — Progress Notes (Signed)
Subjective: No improvement to speech or right hemiplegia.   Records from recent admission at [facility] reviewed: "Kathleen Jensen is a 67 y.o. 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 On 08/10/17 at 1515, patient was discovered to have these stroke-like symptoms at her nursing home. Per nursing home staff, the patient was last seen normal at 1400. Neurology resident spoke firsthand with the patient's nurse via telephone in the ED. Patient's nurse states that at baseline, the patient is able to converse with her and is normally well oriented. The nurse became worried when she could not understand any of the words the patient was saying. She states that the patient did have significant residual right sided weakness after her stroke, but she was usually able to briskly follow commands in the lower left side of her body and face. Prior to admission, she was only able to intermittently follow commands. CT head w/o contrast obtained in the ED showed large area of encephalomalacia in the left parietal lobe, with CTA negative for significant stenosis or occulusion and CTP showing no changes in perfusion clearly suggestive of acute stroke. Per records form [facility] from her acute stroke admission there from 05/31/17 through 11/29, patient was on a train from [place] to [place] to go on a cruise when she was found "slumped over". She presented with aphasia and right-sided weakness, initial NIHSS 14. Systolic blood pressure 220s. CT head revealed large left parietal IPH with intraventricular extension. CTA head and neck revealed no masses or vascular malformations that would account for her ICH. The patient was unable to obtain MRI brain because of pacemaker which was later determined to not be compatible with MRI imaging. [employer] representative interrogated the pacemaker and confirmed  that it was working properly with no significant events. Of note, she was observed to have "twitching," monitored on EEG which showed "diffuse slowing w/ no epileptiform activity." She was seen by speech language pathology and passed for pured and thin liquid diet. She was found to have sepsis due to UTI and was treated with ceftriaxone. Her length of stay was increased due to trouble finding transportation to [facility] rehab facility in [place] close to home, but she remained stable until discharge. Per discharge, the patient was to have a Neurology appointment in 1 month, continue Keppra (1000mg  BID) and Remeron 15 mg nightly, mechanical soft/thin liquids. Plan for aspirin/ other antiplatelet is not mentioned although they do report consulting cardiology for this since the patient had hx CAD and pacemaker. For unknown reasons, Keppra was not on patient's [facility] med list and it does not appear that she had been receiving this.  Differential currently includes most prominently new acute stroke vs seizures/status epilepticus with post-ictal paralysis off AEDs vs hypertensive encephalopathy (highest BP on night of admission 203/99), although symptoms notably did not improve with control of BP. EEG with high epileptogenic potential but no clear seizures, MRI negative for acute stroke."  Objective: Current vital signs: BP 115/83 (BP Location: Left Arm)   Pulse 81   Temp 98.2 F (36.8 C) (Oral)   Resp 19   Ht 5\' 4"  (1.626 m)   Wt 83.7 kg (184 lb 8.4 oz)   SpO2 91%   BMI 31.67 kg/m  Vital signs in last 24 hours: Temp:  [97.5 F (36.4 C)-98.8 F (37.1 C)] 98.2 F (36.8 C) (03/13 0812) Pulse Rate:  [63-81]  81 (03/13 1610) Resp:  [19] 19 (03/13 0024) BP: (93-122)/(59-83) 115/83 (03/13 0812) SpO2:  [91 %-100 %] 91 % (03/13 0812)  Intake/Output from previous day: 03/12 0701 - 03/13 0700 In: 365 [P.O.:240; IV Piggyback:125] Out: -  Intake/Output this shift: No intake/output data  recorded. Nutritional status: Fall precautions DIET DYS 3 Room service appropriate? Yes; Fluid consistency: Thin  Neurological Exam Mental Status: Awake and alert. Somnolence which was seen yesterday is now resolved.  She continues to exhibit prominent receptive and expressive aphasia. Predominantly word salad including nonsensical word-like utterances, with some discernible phrases. Dysarthric. Good eye contact and attention. Makes good effort to follow pantomimed commands.  Cranial Nerves: III,IV, VI: EOMare full. No nystagmus. Some hesitancy with rightward gaze.  VII: Decreased NL fold on right. Marland Kitchen  VIII: hearing is intact to voice Motor: 5/5 strengthLUE and LLE.No movement of RUE to noxious stimuli or spontaneously. 2/5 movement of RLE to noxious plantar stimulation. Other: No jerking, twitching or other adventitious movements noted  Lab Results: Results for orders placed or performed during the hospital encounter of 10/05/17 (from the past 48 hour(s))  Glucose, capillary     Status: Abnormal   Collection Time: 10/07/17 11:39 AM  Result Value Ref Range   Glucose-Capillary 144 (H) 65 - 99 mg/dL   Comment 1 Notify RN    Comment 2 Document in Chart   Glucose, capillary     Status: Abnormal   Collection Time: 10/07/17  4:13 PM  Result Value Ref Range   Glucose-Capillary 120 (H) 65 - 99 mg/dL   Comment 1 Notify RN    Comment 2 Document in Chart   Glucose, capillary     Status: None   Collection Time: 10/07/17  9:16 PM  Result Value Ref Range   Glucose-Capillary 93 65 - 99 mg/dL  Glucose, capillary     Status: None   Collection Time: 10/08/17  6:13 AM  Result Value Ref Range   Glucose-Capillary 66 65 - 99 mg/dL  Glucose, capillary     Status: None   Collection Time: 10/08/17  7:39 AM  Result Value Ref Range   Glucose-Capillary 97 65 - 99 mg/dL   Comment 1 Notify RN    Comment 2 Document in Chart     No results found for this or any previous visit (from the past 240  hour(s)).  Lipid Panel Recent Labs    10/06/17 0646  CHOL 147  TRIG 64  HDL 58  CHOLHDL 2.5  VLDL 13  LDLCALC 76    Studies/Results: No results found.  Medications:  Scheduled: . aspirin EC  81 mg Oral Daily  . atorvastatin  10 mg Oral q1800  . cholecalciferol  1,000 Units Oral q morning - 10a  . enoxaparin (LOVENOX) injection  40 mg Subcutaneous Q24H  . ferrous sulfate  325 mg Oral Q breakfast  . levothyroxine  100 mcg Oral Daily  . mirtazapine  15 mg Oral QHS  . phenytoin (DILANTIN) IV  100 mg Intravenous Q8H  . pregabalin  25 mg Oral Daily  . senna-docusate  1 tablet Oral BID   Continuous: . lacosamide (VIMPAT) IV 100 mg (10/08/17 0904)  . levETIRAcetam Stopped (10/08/17 0447)    EEG 3/10: 1)left posterior quadrant sharp waves 2)left posterior quadrant slow activity Clinical Interpretation: ThisEEG is consistent with an area of epileptogenic potential in the left posterior quadrant. There was no seizure recorded on this study.  LTM EEG 10/07/16 @10 :15 to 10/08/16 @07 :30: Throughout the  recording, sharp waves are seen with a maximum at T3=T5>C3. For the most part these are isolated, but there are periods of semiperiodic organization especially in the first 6 hours of the recording. The sharp waves decrease in frequency and prominence in the second half of the recording. Clinically, there were no obvious clinical signs of seizure, but at 1:26 AM there was emergence of an evolving pattern of sharply contoured fast with rhythmic underlying theta-delta activity, becoming a rhythmic delta sharp with a maximum at T5-C3. Overall Interpretation of EEG: This EEG is indicative of an area of structural abnormality and of an epileptogenic zone in the left parietal-central region. One electrographic seizure was recorded. Additionally, there is a moderate diffuse encephalopathy, non-specific as to etiology. Over the course of the recording, there was an improvement in both the  degree of encephalopathy and in the frequency of the epileptiform discharges.   Assessment:67 year old female with a history of intraparenchymal hematoma who [facility] and [facility] and was felt to have had seizure activity as a cause. She presented with a very similar presentation on Sunday.EEG showed significant cortical irritability but no ongoing seizure. She was loaded with Dilantin in addition to her home Keppra. Currently on Dilantin, Vimpat and Keppra.  1. Today's exam with continued aphasia. No adventitious movements to suggest ongoing motor seizure, but focal seizure involving speech is high on the DDx. 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 Of note, CTA/CTP on 3/10 showed increased cortical blood flow throughout the posterior left cerebral hemisphere, suggestive of physiological response to seizure activity. 2. History of left parietal cortical infarction 3. Possible new onset of atrial fibrillation 4. Dilantin level from yesterday was therapeutic at 18.1  Recommendations: 1. Continue Keppra at home dose of 2000 mg BID (may have been taking incorrectly at lower dose at home).  2. Continue Dilantin at 100 mg TID.  3. Increasing Vimpat to 200 mg BID.  4. Continue LTM.  5. Pending results of LTM EEG day 2, if continued seizures with increased Vimpat dose, may need to have a 4th anticonvulsant added to her regimen.   LOS: 3 days   @Electronically  signed: Dr. Caryl PinaEric Marcquis Ridlon@ 10/08/2017  9:29 AM

## 2017-10-08 NOTE — Progress Notes (Signed)
PT Cancellation Note  Patient Details Name: Kathleen Jensen MRN: [id number] DOB: 01/05/1951   Cancelled Treatment:    Reason Eval/Treat Not Completed: Patient at procedure or test/unavailable;Other (comment) Pt on continuous EEG for the next 24 hours so will follow up to perform PT treatment when EEG discontinued.    Blake DivineShauna A Savayah Waltrip 10/08/2017, 11:53 AM Mylo RedShauna Matthieu Loftus, PT, DPT [phone]

## 2017-10-08 NOTE — Telephone Encounter (Signed)
Spoke with daughter advised her we will hold on medication because of her being in the hospital.  She will call back to see if she wants her still on the medication.

## 2017-10-08 NOTE — Progress Notes (Signed)
  Speech Language Pathology Treatment: Cognitive-Linquistic  Patient Details Name: Kathleen Jensen MRN: [id number] DOB: [birth year] Today's Date: 10/08/2017 Time: [id number] SLP Time Calculation (min) (ACUTE ONLY): 15 min  Assessment / Plan / Recommendation Clinical Impression  Pt was seen for skilled ST targeting communication goals.  Pt currently undergoing EEG but was cleared to participate in bedside ST by RN.  Pt was asleep upon arrival but easily awakened to voice.   Despite being easily awakened, pt remained lethargic and POs were held for pt safety at this time.   Assisted in repositioning pt in bed to maximize pt comfort and alertness.  Pt's verbal expression is characterized by perseverative jargon with poor awareness despite max to total cues from therapist.  Suspect lethargy impacting function.  As a result, pt was unable to complete automatic sequences or singing tasks in unison with therapist.  Pt was left in bed with bed alarm set and call bell within reach.  Pt needed max to total cues to return demonstration of call bell as well as to return social response upon therapist's departure.  Continue per current plan of care.    HPI HPI: 67 year old Female admitted 10/05/17 with sudden onset of aphasia. PMH significant for CKD3, ICH with right hemi (October 2018), HTN, HLD, seizure (January 2019). Head CT = negative for acute abnormalities, chronic Left frontal/parietal and cerebellar infarcts.      SLP Plan  Continue with current plan of care       Recommendations  Diet recommendations: Dysphagia 3 (mechanical soft);Thin liquid Liquids provided via: Cup Medication Administration: Whole meds with liquid Supervision: Staff to assist with self feeding;Full supervision/cueing for compensatory strategies Compensations: Minimize environmental distractions;Small sips/bites Postural Changes and/or Swallow Maneuvers: Seated upright 90 degrees                Oral Care Recommendations:  Oral care BID Follow up Recommendations: 24 hour supervision/assistance SLP Visit Diagnosis: Dysphagia, unspecified (R13.10);Cognitive communication deficit (R41.841) Plan: Continue with current plan of care       GO                PageMelanee Spry, Marshay Slates L 10/08/2017, 10:41 AM

## 2017-10-08 NOTE — Significant Event (Signed)
Hypoglycemic Event  CBG: 66  Treatment: 15 GM carbohydrate snack  Symptoms: None  Follow-up CBG: Time:0730 CBG Result:97  Possible Reasons for Event: Inadequate meal intake  Comments/MD not at this time, protocol followed.   Pt appears more alert this morning, able to state name and follow commands. Staff will assist with meals today   Kathleen Jensen S Nehemiah Montee

## 2017-10-08 NOTE — Progress Notes (Signed)
EEG electrodes checked, all below 10K Ohms, no skin breakdown noted, no complaints from pt. EKG leads reapplied.

## 2017-10-08 NOTE — Progress Notes (Addendum)
Patient noted more alert today. Able to eat breakfast and lunch with assist. VS/ BG remain stable.   Sim BoastHavy, RN

## 2017-10-08 NOTE — Procedures (Signed)
  Video EEG Monitoring Report    Dates of monitoring:   10/07/16 @10 :15 to 10/08/16 @07 :30  Recording day:    1 (started on 3/12) EEG Number:    [id number] Requesting provider:   Caryl PinaLindzen, Eric, MD Interpreting physician:  Wynelle Bourgeoisan-Andrei Nuala Chiles, MD  CPT:  [phone] ICD-10:  2140.901   Present History: 67 year old female with a history of intraparenchymal hematoma and focal seizures, presenting with aphasia and right sided weakness.    EEG Classification  Abnormal,  Stupor <--> Awake  PDR  7 Hz, reactive and better formed on the right  HR  70 bpm and regular irregularly irregular    Background abnormalities:   1. Continuous slow, left hemisphere   2. Continuous slow, generalized    Periodic, rhythmic or epileptiform abnormalities:   1. Sharp waves, left parietal   Ictal phenomena:  Seizure / episode type #1:  Clinical:  NCS   EEG:   Seizure pattern, left parietal-central  Count:   1    EEG DETAILS:  TYPE OF RECORDING: At least 18-channel digital EEG with using a standard international 10-20 placement with additional EEG electrodes, and 1 additional channel dedicated to the EKG, at a sampling rate of at least 256 Hz. Video was recorded throughout the study. The recording was interpreted using digital review software allowing for montage reformatting, gain and filter changes. Each page was reviewed manually. Automatic spike and seizure detection software and quantitative analysis tools were used as needed.   Description of EEG features: The recording reveals a  continuous, variable and reactive symmetric background.  This consists of diffuse, medium amplitude very slow delta dominant activity with overlaying waxing and waning theta rhythms. The background tends to improve over the course of the recording.    During wakefulness normal voltage, mixed frequency rhythms are seen with a dominant theta>delta background and superimposed beta activity which has normal amplitude. A posterior  dominant rhythm of only 7 Hz is observed, which is better formed and better sustained on the right.   There is, however, persistent diffuse, waxing and waning polymorphic delta slowing throughout the recording.   On the left, additionally, there is a fairly broad field area of additional, continuous, waxing and waning polymorphic delta slowing, maximum in the posterior temporal-parietal region.   Interictal Epileptiform Activity: Throughout the recording, sharp waves are seen with a maximum at T3=T5>C3. For the most part these are isolated, but there are periods of semiperiodic organization especially in the first 6 hours of the recording. The sharp waves decrease in frequency and prominence in the second half of the recording.  Ictal Phenomena: 3/12 @1 :26   Clinical: there are no obvious clinical signs   EEG: an evolving pattern of sharply contoured fast with rhythmic underlying theta-delta activity, becoming a rhythmic delta sharp with a maximum at T5-C3.  Push Button Events: none  Interpretation: This EEG is indicative of an area of structural abnormality and of an epileptogenic zone in the right parietal-central region. One electrographic seizure was recorded. Additionally, there is a moderate diffuse encephalopathy, non-specific as to etiology.  Over the course of the recording, there is an improvement in both the degree of encephalopathy and in the frequency of the epileptiform discharges.

## 2017-10-09 ENCOUNTER — Inpatient Hospital Stay (HOSPITAL_COMMUNITY): Payer: Medicare HMO

## 2017-10-09 LAB — CBC
HEMATOCRIT: 36.6 % (ref 36.0–46.0)
HEMOGLOBIN: 12.1 g/dL (ref 12.0–15.0)
MCH: 28.7 pg (ref 26.0–34.0)
MCHC: 33.1 g/dL (ref 30.0–36.0)
MCV: 86.9 fL (ref 78.0–100.0)
Platelets: 166 10*3/uL (ref 150–400)
RBC: 4.21 MIL/uL (ref 3.87–5.11)
RDW: 13.7 % (ref 11.5–15.5)
WBC: 9.8 10*3/uL (ref 4.0–10.5)

## 2017-10-09 LAB — GLUCOSE, CAPILLARY
GLUCOSE-CAPILLARY: 103 mg/dL — AB (ref 65–99)
GLUCOSE-CAPILLARY: 103 mg/dL — AB (ref 65–99)
Glucose-Capillary: 96 mg/dL (ref 65–99)
Glucose-Capillary: 110 mg/dL — ABNORMAL HIGH (ref 65–99)

## 2017-10-09 LAB — PROCALCITONIN: Procalcitonin: <0.1 ng/mL

## 2017-10-09 LAB — COMPREHENSIVE METABOLIC PANEL
ALBUMIN: 3 g/dL — AB (ref 3.5–5.0)
ALK PHOS: 52 U/L (ref 38–126)
ALT: 13 U/L — ABNORMAL LOW (ref 14–54)
ANION GAP: 9 (ref 5–15)
AST: 17 U/L (ref 15–41)
BUN: 16 mg/dL (ref 6–20)
CALCIUM: 8.7 mg/dL — AB (ref 8.9–10.3)
CO2: 21 mmol/L — AB (ref 22–32)
Chloride: 107 mmol/L (ref 101–111)
Creatinine, Ser: 0.97 mg/dL (ref 0.44–1.00)
GFR calc Af Amer: >60 mL/min (ref >60)
GFR calc non Af Amer: 59 mL/min — ABNORMAL LOW (ref >60)
GLUCOSE: 118 mg/dL — AB (ref 65–99)
POTASSIUM: 3.3 mmol/L — AB (ref 3.5–5.1)
SODIUM: 137 mmol/L (ref 135–145)
Total Bilirubin: 0.5 mg/dL (ref 0.3–1.2)
Total Protein: 6.6 g/dL (ref 6.5–8.1)

## 2017-10-09 MED ORDER — POTASSIUM CHLORIDE CRYS ER 20 MEQ PO TBCR
40.0000 meq | EXTENDED_RELEASE_TABLET | Freq: Two times a day (BID) | ORAL | Status: DC
  Administered 2017-10-09 – 2017-10-15 (×13): 40 meq via ORAL
  Filled 2017-10-09 (×13): qty 2

## 2017-10-09 MED ORDER — VALPROATE SODIUM 500 MG/5ML IV SOLN
20.0000 mg/kg | Freq: Once | INTRAVENOUS | Status: AC
  Administered 2017-10-09: 1326 mg via INTRAVENOUS
  Filled 2017-10-09: qty 13.26

## 2017-10-09 MED ORDER — VALPROATE SODIUM 500 MG/5ML IV SOLN
15.0000 mg/kg/d | Freq: Three times a day (TID) | INTRAVENOUS | Status: DC
  Administered 2017-10-09 – 2017-10-10 (×4): 332 mg via INTRAVENOUS
  Filled 2017-10-09 (×6): qty 3.32

## 2017-10-09 NOTE — Progress Notes (Signed)
PROGRESS NOTE  Kathleen Jensen FAO:[id number] DOB: 1951-06-19 DOA: 10/05/2017 PCP: Donato SchultzLowne Chase, Yvonne R, DO  HPI/Recap of past 24 hours: Kathleen Jensen is a 67 y.o. 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 She is evaluated by neurology who opined subclinical seizures. [facility] are asked to admit.  10/06/2017: Patient seen and examined at her bedside.  Expressive aphasia is persistent.  She appears comfortable. Right upper and lower extremities weakness.  No new complaints.  10/07/2017: Patient seen and examined at bedside.  Very somnolent this morning but easily arouses to voices.  Does not appear to be in distress.  Vital signs are stable.  Has no new complaints.  Continuous EEG planned today.  10/08/2017: Patient seen and examined with her RN at her bedside.  No new complaints.  Continuous EEG in progress.  The writer spoke with the patient's daughter regarding placement and updated her about the patient's current medical condition.   10/09/2017: Patient seen and examined at bedside.  Fever overnight with T-max of 101.  Sepsis workup done negative.  Suspect drug fever.    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  Greatly  appreciated. 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  Chronic constipation -Continue Senokot  Obesity BMI 31 Weight loss when more stable outpatient   Code Status: Full  Family Communication: None at bedside  Disposition Plan: PT recommends home health PT.  Discharge to home with home health services after neurology signs off.    Consultants:  Neurology  Speech therapy  Procedures:  None  Antimicrobials:  None  DVT prophylaxis: SCDs, Lovenox subcu daily   Objective: Vitals:   10/08/17 2342 10/09/17 0335 10/09/17 0750 10/09/17 1134  BP: 125/60 138/64 (!) 141/62 133/79  Pulse: 70 72 92 76  Resp:      Temp: 99.5 F (37.5 C) (!) 101.3 F (38.5 C) (!) 101.5 F (38.6 C) 100 F (37.8 C)  TempSrc: Oral Axillary Oral Axillary  SpO2: 100% 100% 96% 97%  Weight:      Height:  Intake/Output Summary (Last 24 hours) at 10/09/2017 1535 Last data filed at 10/09/2017 0442 Gross per 24 hour  Intake 385 ml  Output  400 ml  Net -15 ml   Filed Weights   10/05/17 1746  Weight: 83.7 kg (184 lb 8.4 oz)    Exam: 10/09/2017.  Patient seen and examined.  Physical exam is essentially the same except for what is mentioned below.   General: 67 year old African-American female well-developed well-nourished no acute distress.  Cardiovascular: Regular rate and rhythm with no rubs or gallops  Respiratory: Clear to auscultation with no wheezes or rales  Abdomen: Obese nontender nondistended normal bowel sounds x 4  Musculoskeletal: Right upper and right lower extremity weakness compared to left  Skin: No rash noted  Psychiatry: Mood is appropriate for condition and setting.  Data Reviewed: CBC: Recent Labs  Lab 10/05/17 1250 10/09/17 0734  WBC 7.8 9.8  NEUTROABS 3.9  --   HGB 12.9 12.1  HCT 38.3 36.6  MCV 85.9 86.9  PLT 199 166   Basic Metabolic Panel: Recent Labs  Lab 10/05/17 1250 10/09/17 0734  NA 139 137  K 3.4* 3.3*  CL 105 107  CO2 23 21*  GLUCOSE 99 118*  BUN 12 16  CREATININE 0.75 0.97  CALCIUM 9.6 8.7*   GFR: Estimated Creatinine Clearance: 58.9 mL/min (by C-G formula based on SCr of 0.97 mg/dL). Liver Function Tests: Recent Labs  Lab 10/05/17 1250 10/09/17 0734  AST 38 17  ALT 20 13*  ALKPHOS 68 52  BILITOT 0.7 0.5  PROT 7.7 6.6  ALBUMIN 3.8 3.0*   No results for input(s): LIPASE, AMYLASE in the last 168 hours. No results for input(s): AMMONIA in the last 168 hours. Coagulation Profile: Recent Labs  Lab 10/05/17 1250  INR 1.01   Cardiac Enzymes: Recent Labs  Lab 10/05/17 1250  TROPONINI <0.03   BNP (last 3 results) No results for input(s): PROBNP in the last 8760 hours. HbA1C: No results for input(s): HGBA1C in the last 72 hours. CBG: Recent Labs  Lab 10/08/17 1140 10/08/17 1628 10/08/17 2123 10/09/17 0622 10/09/17 1132  GLUCAP 91 88 139* 96 110*   Lipid Profile: No results for input(s): CHOL, HDL, LDLCALC, TRIG, CHOLHDL, LDLDIRECT in the  last 72 hours. Thyroid Function Tests: No results for input(s): TSH, T4TOTAL, FREET4, T3FREE, THYROIDAB in the last 72 hours. Anemia Panel: No results for input(s): VITAMINB12, FOLATE, FERRITIN, TIBC, IRON, RETICCTPCT in the last 72 hours. Urine analysis:    Component Value Date/Time   BILIRUBINUR 1+ 11/19/2016 1307   PROTEINUR trace 11/19/2016 1307   UROBILINOGEN 1.0 11/19/2016 1307   NITRITE positive 11/19/2016 1307   LEUKOCYTESUR Negative 11/19/2016 1307   Sepsis Labs: @LABRCNTIP (procalcitonin:4,lacticidven:4)  )No results found for this or any previous visit (from the past 240 hour(s)).    Studies: Dg Chest Port 1 View  Result Date: 10/09/2017 CLINICAL DATA:  Fever. EXAM: PORTABLE CHEST 1 VIEW COMPARISON:  No recent prior. FINDINGS: Mediastinum hilar structures normal. Heart size normal. No pulmonary venous congestion. Cardiac pacer with lead tips over the right atrium right ventricle. Surgical clips right upper quadrant. IMPRESSION: Cardiac pacer noted with lead tips over the right atrium right ventricle. Heart size normal. No pulmonary venous congestion. 2.  Low lung volumes with mild basilar atelectasis P Electronically Signed   By: Maisie Fus  Register   On: 10/09/2017 09:55    Scheduled Meds: . aspirin EC  81 mg Oral Daily  . atorvastatin  10 mg Oral q1800  .  cholecalciferol  1,000 Units Oral q morning - 10a  . enoxaparin (LOVENOX) injection  40 mg Subcutaneous Q24H  . ferrous sulfate  325 mg Oral Q breakfast  . levothyroxine  100 mcg Oral Daily  . mirtazapine  15 mg Oral QHS  . phenytoin (DILANTIN) IV  100 mg Intravenous Q8H  . potassium chloride  40 mEq Oral BID  . pregabalin  25 mg Oral Daily  . senna-docusate  1 tablet Oral BID    Continuous Infusions: . lacosamide (VIMPAT) IV Stopped (10/09/17 0958)  . levETIRAcetam Stopped (10/09/17 0507)  . valproate sodium       LOS: 4 days     Darlin Drop, MD [facility] Pager [phone]  If 7PM-7AM,  please contact night-coverage [email or web address] Password [facility] 10/09/2017, 3:35 PM

## 2017-10-09 NOTE — Care Management Important Message (Addendum)
Important Message  Patient Details  Name: Kathleen Jensen MRN: [id number] Date of Birth: [birth year]   Medicare Important Message Given:  Yes  Correction this due to illlness patient was not able to sign /unsigned copy left  Kathleen Jensen [name] 10/09/2017, 12:38 PM

## 2017-10-09 NOTE — Progress Notes (Signed)
OT Cancellation Note  Patient Details Name: Ethelda ChickRovena Galea MRN: [id number] DOB: [birth year]   Cancelled Treatment:    Reason Eval/Treat Not Completed: Medical issues which prohibited therapy;Patient at procedure or test/ unavailable. Pt still on continuous EEG     Galen ManilaSpencer, Edilberto Roosevelt Jeanette 10/09/2017, 2:04 PM

## 2017-10-09 NOTE — Progress Notes (Signed)
[facility] with [facility].  Per my conversation with patient's nurse she indicated that patient is altered mentally and is not able to to understand or complete AD.  Ad was left with nurse for patient family.  Nurse will page chaplain when family arrives to explain to family why AD can't be done.  Will follow as needed.  Venida JarvisWatlington, Tyrese Ficek, [place], [facility], Pager [phone]

## 2017-10-09 NOTE — Care Management (Signed)
Pt started on Vimpat this admission. Benefits check revealed:   Co-pay for Vimpat 200 mg BID $ 255.00 for 30 day supply.  Preferred pharmacy: [facility]  No PA required.  [facility] mail order pharmacy $435.00 for 90 day supply.

## 2017-10-09 NOTE — Procedures (Signed)
  Video EEG Monitoring Report    Dates of monitoring:   10/08/16 @07 :30 to 10/09/16 @07 :30  Recording day:    2 (started on 3/12) EEG Number:    [id number] Requesting provider:   Caryl PinaLindzen, Eric, MD Interpreting physician:  Wynelle Bourgeoisan-Andrei Amariya Liskey, MD  CPT:  [phone] ICD-10:  6840.901   Present History: 67 year old female with a history of intraparenchymal hematoma and focal seizures, presenting with aphasia and right sided weakness.    EEG Classification  Abnormal, Awake and sleep  PDR  9 Hz on the right, 7 Hz on the left  HR  70 bpm and regular irregularly irregular    Background abnormalities:   1. Continuous slow, left hemisphere      Periodic, rhythmic or epileptiform abnormalities:   1. Sharp waves, left parietal   Ictal phenomena:  none    EEG DETAILS:  TYPE OF RECORDING: At least 18-channel digital EEG with using a standard international 10-20 placement with additional EEG electrodes, and 1 additional channel dedicated to the EKG, at a sampling rate of at least 256 Hz. Video was recorded throughout the study. The recording was interpreted using digital review software allowing for montage reformatting, gain and filter changes. Each page was reviewed manually. Automatic spike and seizure detection software and quantitative analysis tools were used as needed.   Description of EEG features: During wakefulness normal voltage, mixed frequency rhythms are seen with a dominant theta>delta background and superimposed beta activity which has normal amplitude. A posterior dominant rhythm of 9 Hz is observed on the right, but it only reaches 7 Hz on the left.   On the left, there is a fairly broad field area of additional, continuous, waxing and waning polymorphic delta slowing, maximum in the posterior temporal-parietal region.   Clinical sleep is characterized by  diffuse, medium amplitude slow delta dominant activity with overlaying waxing and waning theta rhythms and intermittent beta,  with persistent asymmetry.   Interictal Epileptiform Activity: Throughout the recording, sharp waves are seen with a maximum at T3=T5>P3. They are almost absent in wakefulness, but more frequent in sleep. They are isolated and less <1 / hour for 80% of the recording, but in sleep there are intermittent, 30 minute - 1 hour segments of semiperiodic organization with up to 1 sharp / 4 seconds.   Push Button Events: none  Interpretation: This EEG is indicative of an area of structural abnormality and/or cortical dysfunction in the right hemisphere. Moreover, there is an epileptogenic zone in the right parietal-central region. Compared to previous epoch, no seizures are recorded and there are fewer epileptiform discharges.   The right hemisphere now displays normal rhythms suggesting resolution of the previously seen diffuse encephalopathy.

## 2017-10-09 NOTE — Progress Notes (Addendum)
Subjective: Patient seen and examined at bedside with Dr. Cheral Marker. She is alert and attempts to communicate, but exhibits expressive aphasia with word salad and nonsensical speech. She will attempt to mimic holding fingers up with her left hand. Examination by Dr. Cheral Marker unchanged compared to yesterday. She has spike fevers yesterday and overnight with Tmax 101.52F.  Objective: Current vital signs: BP (!) 141/62 (BP Location: Left Arm)   Pulse 92   Temp (!) 101.5 F (38.6 C) (Oral)   Resp 19   Ht 5' 4"  (1.626 m)   Wt 184 lb 8.4 oz (83.7 kg)   SpO2 96%   BMI 31.67 kg/m  Vital signs in last 24 hours: Temp:  [98 F (36.7 C)-101.8 F (38.8 C)] 101.5 F (38.6 C) (03/14 0750) Pulse Rate:  [70-92] 92 (03/14 0750) BP: (118-142)/(60-86) 141/62 (03/14 0750) SpO2:  [96 %-100 %] 96 % (03/14 0750)  Intake/Output from previous day: 03/13 0701 - 03/14 0700 In: 915 [P.O.:720; IV Piggyback:195] Out: 400 [Urine:400] Intake/Output this shift: No intake/output data recorded. Nutritional status: Fall precautions DIET DYS 3 Room service appropriate? Yes; Fluid consistency: Thin  Neurological Exam: Unchanged compared to yesterday by Dr. Cheral Marker Mental Status: Awake and alert. Somnolence which was seen previously is now resolved.  She continues to exhibit prominent receptive and expressive aphasia. Predominantly word salad including nonsensical word-like utterances, with no discernible phrases today. Dysarthric. Good eye contact and attention. Makes good effort to follow pantomimed commands.  Cranial Nerves: III,IV, VI: EOMare full. No nystagmus. Mild/subtle hesitancy with rightward gaze.  VII: Decreased NL fold on right. Marland Kitchen  VIII: hearing is intact to voice Motor: 5/5 strengthLUE and LLE.No movement of RUE to noxious stimuli or spontaneously. 2/5 movement of RLE to noxious plantar stimulation. Flaccid tone of RUE, decreased tone of RLE. Other: No jerking, twitching or other adventitious  movements noted  Lab Results: Results for orders placed or performed during the hospital encounter of 10/05/17 (from the past 48 hour(s))  Glucose, capillary     Status: Abnormal   Collection Time: 10/07/17 11:39 AM  Result Value Ref Range   Glucose-Capillary 144 (H) 65 - 99 mg/dL   Comment 1 Notify RN    Comment 2 Document in Chart   Glucose, capillary     Status: Abnormal   Collection Time: 10/07/17  4:13 PM  Result Value Ref Range   Glucose-Capillary 120 (H) 65 - 99 mg/dL   Comment 1 Notify RN    Comment 2 Document in Chart   Glucose, capillary     Status: None   Collection Time: 10/07/17  9:16 PM  Result Value Ref Range   Glucose-Capillary 93 65 - 99 mg/dL  Glucose, capillary     Status: None   Collection Time: 10/08/17  6:13 AM  Result Value Ref Range   Glucose-Capillary 66 65 - 99 mg/dL  Glucose, capillary     Status: None   Collection Time: 10/08/17  7:39 AM  Result Value Ref Range   Glucose-Capillary 97 65 - 99 mg/dL   Comment 1 Notify RN    Comment 2 Document in Chart   Glucose, capillary     Status: None   Collection Time: 10/08/17 11:40 AM  Result Value Ref Range   Glucose-Capillary 91 65 - 99 mg/dL   Comment 1 Notify RN    Comment 2 Document in Chart   Glucose, capillary     Status: None   Collection Time: 10/08/17  4:28 PM  Result Value Ref  Range   Glucose-Capillary 88 65 - 99 mg/dL   Comment 1 Notify RN    Comment 2 Document in Chart   Glucose, capillary     Status: Abnormal   Collection Time: 10/08/17  9:23 PM  Result Value Ref Range   Glucose-Capillary 139 (H) 65 - 99 mg/dL  Glucose, capillary     Status: None   Collection Time: 10/09/17  6:22 AM  Result Value Ref Range   Glucose-Capillary 96 65 - 99 mg/dL   Comment 1 Notify RN    Comment 2 Document in Chart   CBC     Status: None   Collection Time: 10/09/17  7:34 AM  Result Value Ref Range   WBC 9.8 4.0 - 10.5 K/uL   RBC 4.21 3.87 - 5.11 MIL/uL   Hemoglobin 12.1 12.0 - 15.0 g/dL   HCT 36.6  36.0 - 46.0 %   MCV 86.9 78.0 - 100.0 fL   MCH 28.7 26.0 - 34.0 pg   MCHC 33.1 30.0 - 36.0 g/dL   RDW 13.7 11.5 - 15.5 %   Platelets 166 150 - 400 K/uL    Comment: Performed at [facility], [place] [address], [place], [place] [address]  Comprehensive metabolic panel     Status: Abnormal   Collection Time: 10/09/17  7:34 AM  Result Value Ref Range   Sodium 137 135 - 145 mmol/L   Potassium 3.3 (L) 3.5 - 5.1 mmol/L   Chloride 107 101 - 111 mmol/L   CO2 21 (L) 22 - 32 mmol/L   Glucose, Bld 118 (H) 65 - 99 mg/dL   BUN 16 6 - 20 mg/dL   Creatinine, Ser 0.97 0.44 - 1.00 mg/dL   Calcium 8.7 (L) 8.9 - 10.3 mg/dL   Total Protein 6.6 6.5 - 8.1 g/dL   Albumin 3.0 (L) 3.5 - 5.0 g/dL   AST 17 15 - 41 U/L   ALT 13 (L) 14 - 54 U/L   Alkaline Phosphatase 52 38 - 126 U/L   Total Bilirubin 0.5 0.3 - 1.2 mg/dL   GFR calc non Af Amer 59 (L) >60 mL/min   GFR calc Af Amer >60 >60 mL/min    Comment: (NOTE) The eGFR has been calculated using the CKD EPI equation. This calculation has not been validated in all clinical situations. eGFR's persistently <60 mL/min signify possible Chronic Kidney Disease.    Anion gap 9 5 - 15    Comment: Performed at [facility] [address], [place],  [address]    No results found for this or any previous visit (from the past 240 hour(s)).  Lipid Panel No results for input(s): CHOL, TRIG, HDL, CHOLHDL, VLDL, LDLCALC in the last 72 hours.  Studies/Results: No results found.  Medications:  Scheduled: . aspirin EC  81 mg Oral Daily  . atorvastatin  10 mg Oral q1800  . cholecalciferol  1,000 Units Oral q morning - 10a  . enoxaparin (LOVENOX) injection  40 mg Subcutaneous Q24H  . ferrous sulfate  325 mg Oral Q breakfast  . levothyroxine  100 mcg Oral Daily  . mirtazapine  15 mg Oral QHS  . phenytoin (DILANTIN) IV  100 mg Intravenous Q8H  . pregabalin  25 mg Oral Daily  . senna-docusate  1 tablet Oral BID   Continuous: . lacosamide  (VIMPAT) IV 200 mg (10/09/17 0928)  . levETIRAcetam Stopped (10/09/17 0507)  . valproate sodium     Followed by  .  valproate sodium      EEG 3/10: 1)left posterior quadrant sharp waves 2)left posterior quadrant slow activity Clinical Interpretation: ThisEEG is consistent with an area of epileptogenic potential in the left posterior quadrant. There was no seizure recorded on this study.  LTM EEG 10/07/16 @10 :15 to 10/08/16 @07 :30: Throughout the recording, sharp waves are seen with a maximum at T3=T5>C3. For the most part these are isolated, but there are periods of semiperiodic organization especially in the first 6 hours of the recording. The sharp waves decrease in frequency and prominence in the second half of the recording. Clinically, there were no obvious clinical signs of seizure, but at 1:26 AM there was emergence of an evolving pattern of sharply contoured fast with rhythmic underlying theta-delta activity, becoming a rhythmic delta sharp with a maximum at T5-C3. Overall Interpretation of EEG: This EEG is indicative of an area of structural abnormality and of an epileptogenic zone in the left parietal-central region. One electrographic seizure was recorded. Additionally, there is a moderate diffuse encephalopathy, non-specific as to etiology. Over the course of the recording, there was an improvement in both the degree of encephalopathy and in the frequency of the epileptiform discharges.   LTM EEG 10/08/16 @07 :30 to 10/09/16 @07 :30:  Throughout the recording, sharp waves are seen with a maximum at T3=T5>P3. They are almost absent in wakefulness, but more frequent in sleep. They are isolated and less <1 / hour for 80% of the recording, but in sleep there are intermittent, 30 minute - 1 hour segments of semiperiodic organization with up to 1 sharp / 4 seconds. Interpretation: This EEG is indicative of an area of structural abnormality and/or cortical dysfunction in the right hemisphere.  Moreover, there is an epileptogenic zone in the left parietal-central region. Compared to previous epoch, no seizures are recorded and there are fewer epileptiform discharges. The right hemisphere now displays normal rhythms suggesting resolution of the previously seen diffuse encephalopathy.   Assessment:67 year old female with a history of intraparenchymal hematoma who presented with aphasia and right-sided weakness at [facility] in January and was felt to have had seizure activity as a cause. She presented with a very similar presentation on Sunday.EEG showed significant cortical irritability but no ongoing seizure. She was loaded with Dilantin in addition to her home Keppra. Currently on Dilantin, Vimpat and Keppra.  1. Today's exam with continued aphasia, clinically no change compared to yesterday. No adventitious movements to suggest ongoing motor seizure, but focal seizure involving speech is high on the DDx. Overall clinical picture remains most consistent with postictal state secondary to subclinical seizures, which has not improved with addition of Vimpat. 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 Of note, CTA/CTP on 3/10 showed increased cortical blood flow throughout the posterior left cerebral hemisphere, suggestive of physiological response to seizure activity. LTM EEG does suggest improvement of the previously seen diffuse encephalopathy, however clinically she has not shown improvement. Will continue LTM EEG and hope she will show some clinical improvement concordant with her EEG findings. 2. History of left parietal cortical infarction 3. Possible new onset of atrial fibrillation 4. Dilantin level from 3/11 was therapeutic at 18.1 5. Fevers  Recommendations: 1. Continue Keppra at home dose of 2000 mg BID (may have been taking incorrectly at  lower dose at home).  2. Continue Dilantin at 100 mg TID.  3. Continue Vimpat to 200 mg  BID.  4. Continue LTM.  5. Add Depakote short term in attempt to break seizures - Load with 20 mg/kg once then scheduled 5 mg/kg q8h (15 mg/kg/day) beginning 8 hours after load. Avoiding phenobarbital and benzodiazepines due to oversedation risk. 6. Fevers - infectious workup begun per primary. Consider drug fever as well. Dilantin started 3/10 which can be associated and coincides with timing of fever spikes.   LOS: 4 days   Zada Finders, MD Internal Medicine PGY-3 10/09/2017  9:33 AM  I have seen and examined the patient. Exam findings, assessment and recommendations discussed with neurology resident.  Electronically signed: Dr. Kerney Elbe

## 2017-10-09 NOTE — Progress Notes (Signed)
CM met with the patient and her daughter, Severiano Gilbert, to discuss d/c plans. Pt was active with [facility] prior to admission and the daughter would like to continue with them. She is interested in having more services in the home if able.  Daughter inquired about aides. CM notified her this would be private pay.  Pt started on Vimpat. CM provided the daughter with 30 day free coupon and encouraged her to call the number for financial assistance after the 30 free days. Daughter expressed understanding.  CM following.

## 2017-10-10 LAB — PHENYTOIN LEVEL, TOTAL: PHENYTOIN LVL: 6.7 ug/mL — AB (ref 10.0–20.0)

## 2017-10-10 LAB — VALPROIC ACID LEVEL: VALPROIC ACID LVL: 65 ug/mL (ref 50.0–100.0)

## 2017-10-10 MED ORDER — VALPROATE SODIUM 500 MG/5ML IV SOLN
30.0000 mg/kg/d | Freq: Three times a day (TID) | INTRAVENOUS | Status: DC
  Administered 2017-10-10 – 2017-10-14 (×11): 663 mg via INTRAVENOUS
  Filled 2017-10-10 (×12): qty 6.63

## 2017-10-10 NOTE — Progress Notes (Signed)
PROGRESS NOTE  Kathleen Jensen WUJ:[id number] DOB: 06-30-1951 DOA: 10/05/2017 PCP: Donato SchultzLowne Chase, Yvonne R, DO  HPI/Recap of past 24 hours: Kathleen Jensen is a 67 y.o. 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 She is evaluated by neurology who opined subclinical seizures. [facility] are asked to admit.  10/06/2017: Patient seen and examined at her bedside.  Expressive aphasia is persistent.  She appears comfortable. Right upper and lower extremities weakness.  No new complaints.  10/07/2017: Patient seen and examined at bedside.  Very somnolent this morning but easily arouses to voices.  Does not appear to be in distress.  Vital signs are stable.  Has no new complaints.  Continuous EEG planned today.  10/08/2017: Patient seen and examined with her RN at her bedside.  No new complaints.  Continuous EEG in progress.  The writer spoke with the patient's daughter regarding placement and updated her about the patient's current medical condition.   10/09/2017: Patient seen and examined at bedside.  Fever overnight with T-max of 101.  Sepsis workup done negative.  Suspect drug fever.  10/10/2017: Patient seen and examined at bedside.  She has no new complaints.  She is on continuous EEG.  No reported events overnight.    Assessment/Plan: Principal Problem:   Stroke-like symptoms Active Problems:   Hypothyroidism   OBESITY, MORBID   OBSTRUCTIVE SLEEP APNEA   Essential hypertension   Chronic kidney disease (CKD), stage III (moderate) (HCC)   Hyperlipidemia  Expressive aphasia in the setting of old CVA -Neurology following -Continue home meds -Code stroke CT head done on 10/05/2017 revealed old stroke and no acute intracranial findings -Fall precaution -Speech therapist  evaluated and recommended mechanical soft thin liquid  Suspected subclinical seizure per neurology -Neurology following.  Greatly appreciated. 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  Chronic constipation -Continue Senokot  Obesity BMI 31 Weight loss when more stable outpatient   Code Status: Full  Family Communication: None at bedside  Disposition Plan: PT recommends home health PT.  Discharge to home with home health services after neurology signs off.    Consultants:  Neurology  Speech therapy  Procedures:  None  Antimicrobials:  None  DVT prophylaxis: SCDs, Lovenox subcu daily   Objective: Vitals:   10/10/17 0022 10/10/17 0500 10/10/17 1003 10/10/17 1226  BP: 130/79 137/89 111/60 (!) 143/80  Pulse: 73 89 (!) 58 70  Resp:   16 20  Temp: 98.3 F (36.8 C) 99 F (37.2 C) 98.3 F (36.8 C) 98 F (36.7 C)  TempSrc: Axillary  Axillary Oral Oral  SpO2: 96% 96% 98% 99%  Weight:      Height:         Intake/Output Summary (Last 24 hours) at 10/10/2017 1519 Last data filed at 10/10/2017 0530 Gross per 24 hour  Intake 609.96 ml  Output 800 ml  Net -190.04 ml   Filed Weights   10/05/17 1746  Weight: 83.7 kg (184 lb 8.4 oz)    Exam: 10/10/2017.  Patient seen and examined.  Physical exam is essentially the same except for what is mentioned below.   General: 67 year old African-American female well-developed well-nourished in no acute distress.  Severe dysarthria.  Cardiovascular: Regular rate and rhythm with no rubs or gallops  Respiratory: Clear to auscultation with no wheezes or rales  Abdomen: Obese nontender nondistended normal bowel sounds x 4  Musculoskeletal: Right upper and right lower extremity weakness compared to left  Skin: No rash noted  Psychiatry: Mood is appropriate for condition and setting.  Data Reviewed: CBC: Recent Labs  Lab 10/05/17 1250 10/09/17 0734  WBC 7.8 9.8  NEUTROABS 3.9  --   HGB 12.9 12.1  HCT 38.3 36.6  MCV 85.9 86.9  PLT 199 166   Basic Metabolic Panel: Recent Labs  Lab 10/05/17 1250 10/09/17 0734  NA 139 137  K 3.4* 3.3*  CL 105 107  CO2 23 21*  GLUCOSE 99 118*  BUN 12 16  CREATININE 0.75 0.97  CALCIUM 9.6 8.7*   GFR: Estimated Creatinine Clearance: 58.9 mL/min (by C-G formula based on SCr of 0.97 mg/dL). Liver Function Tests: Recent Labs  Lab 10/05/17 1250 10/09/17 0734  AST 38 17  ALT 20 13*  ALKPHOS 68 52  BILITOT 0.7 0.5  PROT 7.7 6.6  ALBUMIN 3.8 3.0*   No results for input(s): LIPASE, AMYLASE in the last 168 hours. No results for input(s): AMMONIA in the last 168 hours. Coagulation Profile: Recent Labs  Lab 10/05/17 1250  INR 1.01   Cardiac Enzymes: Recent Labs  Lab 10/05/17 1250  TROPONINI <0.03   BNP (last 3 results) No results for input(s): PROBNP in the last 8760 hours. HbA1C: No results for input(s): HGBA1C in the last 72 hours. CBG: Recent Labs  Lab 10/08/17 2123 10/09/17 0622  10/09/17 1132 10/09/17 1612 10/09/17 2205  GLUCAP 139* 96 110* 103* 103*   Lipid Profile: No results for input(s): CHOL, HDL, LDLCALC, TRIG, CHOLHDL, LDLDIRECT in the last 72 hours. Thyroid Function Tests: No results for input(s): TSH, T4TOTAL, FREET4, T3FREE, THYROIDAB in the last 72 hours. Anemia Panel: No results for input(s): VITAMINB12, FOLATE, FERRITIN, TIBC, IRON, RETICCTPCT in the last 72 hours. Urine analysis:    Component Value Date/Time   BILIRUBINUR 1+ 11/19/2016 1307   PROTEINUR trace 11/19/2016 1307   UROBILINOGEN 1.0 11/19/2016 1307   NITRITE positive 11/19/2016 1307   LEUKOCYTESUR Negative 11/19/2016 1307   Sepsis Labs: @LABRCNTIP (procalcitonin:4,lacticidven:4)  ) Recent Results (from the past 240 hour(s))  Culture, blood (routine x 2)     Status: Abnormal (Preliminary result)   Collection Time: 10/09/17  7:25 AM  Result Value Ref Range Status   Specimen Description BLOOD BLOOD RIGHT HAND  Final   Special Requests AEROCOCCUS SPECIES Blood Culture adequate volume (A)  Final   Culture   Final    NO GROWTH 1 DAY Performed at [facility], [address], [place], [place] [address]    Report Status PENDING  Incomplete  Culture, blood (routine x 2)     Status: None (  Preliminary result)   Collection Time: 10/09/17  7:33 AM  Result Value Ref Range Status   Specimen Description BLOOD LEFT ANTECUBITAL  Final   Special Requests   Final    BOTTLES DRAWN AEROBIC ONLY Blood Culture adequate volume   Culture   Final    NO GROWTH 1 DAY Performed at [facility], [address], [place], [place] [address]    Report Status PENDING  Incomplete      Studies: No results found.  Scheduled Meds: . aspirin EC  81 mg Oral Daily  . atorvastatin  10 mg Oral q1800  . cholecalciferol  1,000 Units Oral q morning - 10a  . enoxaparin (LOVENOX) injection  40 mg Subcutaneous Q24H  . ferrous sulfate  325 mg Oral Q breakfast  . levothyroxine  100 mcg Oral Daily  .  mirtazapine  15 mg Oral QHS  . phenytoin (DILANTIN) IV  100 mg Intravenous Q8H  . potassium chloride  40 mEq Oral BID  . pregabalin  25 mg Oral Daily  . senna-docusate  1 tablet Oral BID    Continuous Infusions: . lacosamide (VIMPAT) IV 200 mg (10/10/17 0949)  . levETIRAcetam Stopped (10/10/17 0327)  . valproate sodium Stopped (10/10/17 0617)     LOS: 5 days     Darlin Drop, MD [facility] Pager [phone]  If 7PM-7AM, please contact night-coverage [email or web address] Password [facility] 10/10/2017, 3:19 PM

## 2017-10-10 NOTE — Progress Notes (Signed)
Patient received valproate and phenytoin late due to IV not working

## 2017-10-10 NOTE — Plan of Care (Signed)
  Progressing Clinical Measurements: Ability to maintain clinical measurements within normal limits will improve 10/10/2017 0523 - Progressing by Lennice Sitesepe, Camia Dipinto N, RN Will remain free from infection 10/10/2017 0523 - Progressing by Lennice Sitesepe, Rourke Mcquitty N, RN Diagnostic test results will improve 10/10/2017 0523 - Progressing by Lennice Sitesepe, Tauren Delbuono N, RN Respiratory complications will improve 10/10/2017 0523 - Progressing by Lennice Sitesepe, Gwenyth Dingee N, RN Cardiovascular complication will be avoided 10/10/2017 0523 - Progressing by Lennice Sitesepe, Ameerah Huffstetler N, RN Coping: Level of anxiety will decrease 10/10/2017 0523 - Progressing by Lennice Sitesepe, Shontez Sermon N, RN Pain Managment: General experience of comfort will improve 10/10/2017 0523 - Progressing by Lennice Sitesepe, Rolla Kedzierski N, RN Safety: Ability to remain free from injury will improve 10/10/2017 0523 - Progressing by Lennice Sitesepe, Janalee Grobe N, RN Skin Integrity: Risk for impaired skin integrity will decrease 10/10/2017 0523 - Progressing by Lennice Sitesepe, Orra Nolde N, RN Education: Knowledge of disease or condition will improve 10/10/2017 0523 - Progressing by Lennice Sitesepe, Ziggy Reveles N, RN Knowledge of secondary prevention will improve 10/10/2017 0523 - Progressing by Lennice Sitesepe, Oley Lahaie N, RN Knowledge of patient specific risk factors addressed and post discharge goals established will improve 10/10/2017 0523 - Progressing by Lennice Sitesepe, Tiffani Kadow N, RN

## 2017-10-10 NOTE — Progress Notes (Signed)
Physical Therapy Treatment Patient Details Name: Kathleen Jensen MRN: [id number] DOB: 01-27-1951 Today's Date: 10/10/2017    History of Present Illness 67 y.o. female with PMH significant for ICH in October, seizure in January 2019, HTN, hemiparesis, CKD stage III, HLD, hypothyroidism, presents to the ED from [facility] with chief complaint of acute aphasia. NIH:9. Head CT-unremarkable. Possible new A-fib vs status epileptus.     PT Comments    Pt was able to stand with steady and get to the recliner chair with a significant amount of two person assist.  She can weakly follow therapist initiation to move, but is very slow to process.  I did not feel it was safe for RN staff to attempt steady back from the low recliner chair and advised maxi move total lift for safe transfer back to bed.    Follow Up Recommendations  Home health PT;Supervision for mobility/OOB;Supervision/Assistance - 24 hour(continue HH services)     Equipment Recommendations  None recommended by PT    Recommendations for Other Services   NA     Precautions / Restrictions Precautions Precautions: Fall Precaution Comments: right hemiparesis from prior stroke    Mobility  Bed Mobility Overal bed mobility: Needs Assistance Bed Mobility: Rolling;Supine to Sit Rolling: Max assist;+2 for physical assistance Sidelying to sit: Max assist;+2 for physical assistance Supine to sit: Max assist;+2 for physical assistance;HOB elevated     General bed mobility comments: Max assist to support trunk and progress legs to EOB.  Pt reaching and attempting to pull up and move left leg, but very weak.    Transfers Overall transfer level: Needs assistance   Transfers: Sit to/from Stand;Stand Pivot Transfers Sit to Stand: +2 physical assistance;Max assist;From elevated surface Stand pivot transfers: From elevated surface(with steady standing frame)       General transfer comment: Sit to stand from elevated bed with  heavy lean to the left.  Assist needed to power up, extend hips to put down sitting pads and to prevent right lateral lean off of the standing frame.  Pt is also very flexed anteriorly with significant decreased trunk strength and control.        Modified Rankin (Stroke Patients Only) Modified Rankin (Stroke Patients Only) Pre-Morbid Rankin Score: Severe disability Modified Rankin: Severe disability     Balance Overall balance assessment: Needs assistance Sitting-balance support: Feet supported;Single extremity supported Sitting balance-Leahy Scale: Poor Sitting balance - Comments: mod to max assist for upright posture EOB.   Postural control: Right lateral lean;Other (comment)(forward lean ) Standing balance support: Single extremity supported Standing balance-Leahy Scale: Zero Standing balance comment: max assist and standing frame needed for transfer.                             Cognition Arousal/Alertness: Awake/alert Behavior During Therapy: Flat affect Overall Cognitive Status: Impaired/Different from baseline Area of Impairment: Attention;Memory;Following commands;Problem solving                   Current Attention Level: Sustained   Following Commands: Follows one step commands with increased time;Follows one step commands inconsistently Safety/Judgement: Decreased awareness of safety;Decreased awareness of deficits Awareness: Intellectual Problem Solving: Slow processing;Decreased initiation;Difficulty sequencing;Requires verbal cues;Requires tactile cues General Comments: Pt very slow to process commands and answer questions.  She needs multimodal cues to initiate movement.  She is very flat.  unsure of how much of this is baseline.  General Comments General comments (skin integrity, edema, etc.): Advised RN to use maxi move lift to get back to the bed from low chair as it was a struggle to get to the steady from a high bed.  Continuous EEG  still running and pt placed in range of the camera.       Pertinent Vitals/Pain Pain Assessment: Faces Faces Pain Scale: Hurts little more Pain Location: generalized with mobility, but pt unable to localize Pain Descriptors / Indicators: Grimacing;Guarding;Moaning Pain Intervention(s): Limited activity within patient's tolerance;Monitored during session;Repositioned           PT Goals (current goals can now be found in the care plan section) Acute Rehab PT Goals Patient Stated Goal: none stated today    Frequency    Min 2X/week(due to slow progression)      PT Plan Current plan remains appropriate;Frequency needs to be updated    Co-evaluation PT/OT/SLP Co-Evaluation/Treatment: Yes Reason for Co-Treatment: Complexity of the patient's impairments (multi-system involvement);For patient/therapist safety;Necessary to address cognition/behavior during functional activity;To address functional/ADL transfers PT goals addressed during session: Mobility/safety with mobility;Balance           End of Session Equipment Utilized During Treatment: Gait belt Activity Tolerance: Patient tolerated treatment well Patient left: in chair;with call bell/phone within reach Nurse Communication: Mobility status;Need for lift equipment PT Visit Diagnosis: Hemiplegia and hemiparesis;Muscle weakness (generalized) (M62.81);Pain Hemiplegia - Right/Left: Right Hemiplegia - dominant/non-dominant: Dominant Hemiplegia - caused by: Cerebral infarction     Time: 1112-1200 PT Time Calculation (min) (ACUTE ONLY): 48 min  Charges:  $Therapeutic Activity: 23-37 mins          Sanjeev Main B. Joyous Gleghorn, PT, DPT [phone]            10/10/2017, 12:10 PM

## 2017-10-10 NOTE — Progress Notes (Signed)
LTM EEG continues.  Pt's scalp checked, no skin break down noted.  Electrodes reset and impedances acceptable.

## 2017-10-10 NOTE — Progress Notes (Signed)
Occupational Therapy Treatment Patient Details Name: Dejia Ebron MRN: [id number] DOB: 1951/07/13 Today's Date: 10/10/2017    History of present illness 67 y.o. female with PMH significant for ICH in October, seizure in January 2019, HTN, hemiparesis, CKD stage III, HLD, hypothyroidism, presents to the ED from [facility] with chief complaint of acute aphasia. NIH:9. Head CT-unremarkable. Possible new A-fib vs status epileptus.    OT comments  Total A for hygiene after large BM upon arrival. OT will continue to follow acutely Pt was able to stand with steady and get to the recliner chair with a significant amount of two person assist.  She can weakly follow therapist initiation to move, but is very slow to process.  I did not feel it was safe for RN staff to attempt Stedy back from the low recliner chair and advised maxi move total lift for safe transfer back to bed.     Follow Up Recommendations  Home health OT;Supervision/Assistance - 24 hour    Equipment Recommendations  Other (comment)(TBD)    Recommendations for Other Services      Precautions / Restrictions Precautions Precautions: Fall Precaution Comments: right hemiparesis from prior stroke       Mobility Bed Mobility Overal bed mobility: Needs Assistance Bed Mobility: Rolling;Supine to Sit Rolling: Max assist;+2 for physical assistance Sidelying to sit: Max assist;+2 for physical assistance Supine to sit: Max assist;+2 for physical assistance;HOB elevated     General bed mobility comments: Max assist to support trunk and progress legs to EOB.  Pt reaching and attempting to pull up and move left leg, but very weak.    Transfers Overall transfer level: Needs assistance   Transfers: Sit to/from Stand;Stand Pivot Transfers Sit to Stand: +2 physical assistance;Max assist;From elevated surface Stand pivot transfers: From elevated surface       General transfer comment: Sit to stand from elevated bed with  heavy lean to the left.  Assist needed to power up, extend hips to put down sitting pads and to prevent right lateral lean off of the standing frame.  Pt is also very flexed anteriorly with significant decreased trunk strength and control.      Balance Overall balance assessment: Needs assistance Sitting-balance support: Feet supported;Single extremity supported Sitting balance-Leahy Scale: Poor Sitting balance - Comments: mod to max assist for upright posture EOB.   Postural control: Right lateral lean;Other (comment)(forward leaning) Standing balance support: Single extremity supported Standing balance-Leahy Scale: Zero Standing balance comment: max assist and standing frame needed for transfer.                            ADL either performed or assessed with clinical judgement   ADL Overall ADL's : Needs assistance/impaired Eating/Feeding: Sitting;Minimal assistance                           Toileting- Clothing Manipulation and Hygiene: +2 for physical assistance;Sit to/from stand;Total assistance         General ADL Comments: used Stedy to transfer pt to recliner. Total A with toileting and hygiene at bed level after pt had large BM     Vision Patient Visual Report: No change from baseline     Perception     Praxis      Cognition Arousal/Alertness: Awake/alert Behavior During Therapy: Flat affect Overall Cognitive Status: Impaired/Different from baseline Area of Impairment: Attention;Memory;Following commands;Problem solving  Current Attention Level: Sustained   Following Commands: Follows one step commands with increased time;Follows one step commands inconsistently Safety/Judgement: Decreased awareness of safety;Decreased awareness of deficits Awareness: Intellectual Problem Solving: Slow processing;Decreased initiation;Difficulty sequencing;Requires verbal cues;Requires tactile cues General Comments: Pt very slow to  process commands and answer questions.  She needs multimodal cues to initiate movement.  She is very flat.  unsure of how much of this is baseline.         Exercises     Shoulder Instructions       General Comments Advised RN to use maxi move lift to get back to the bed from low chair as it was a struggle to get to the steady from a high bed.  Continuous EEG still running and pt placed in range of the camera.     Pertinent Vitals/ Pain       Pain Assessment: No/denies pain Faces Pain Scale: Hurts little more Pain Location: generalized with mobility, but pt unable to localize Pain Descriptors / Indicators: Grimacing;Guarding;Moaning Pain Intervention(s): Limited activity within patient's tolerance;Monitored during session;Repositioned  Home Living                                          Prior Functioning/Environment              Frequency  Min 2X/week        Progress Toward Goals  OT Goals(current goals can now be found in the care plan section)  Progress towards OT goals: OT to reassess next treatment  Acute Rehab OT Goals Patient Stated Goal: none stated today  Plan Discharge plan remains appropriate    Co-evaluation      Reason for Co-Treatment: Complexity of the patient's impairments (multi-system involvement);For patient/therapist safety;Necessary to address cognition/behavior during functional activity;To address functional/ADL transfers PT goals addressed during session: Mobility/safety with mobility;Balance        AM-PAC PT "6 Clicks" Daily Activity     Outcome Measure   Help from another person eating meals?: A Little Help from another person taking care of personal grooming?: A Lot Help from another person toileting, which includes using toliet, bedpan, or urinal?: Total Help from another person bathing (including washing, rinsing, drying)?: A Lot Help from another person to put on and taking off regular upper body clothing?: A  Lot Help from another person to put on and taking off regular lower body clothing?: A Lot 6 Click Score: 12    End of Session Equipment Utilized During Treatment: Gait belt;Other (comment)(Stedy)  OT Visit Diagnosis: Unsteadiness on feet (R26.81);Other abnormalities of gait and mobility (R26.89);Muscle weakness (generalized) (M62.81);Other symptoms and signs involving cognitive function;Hemiplegia and hemiparesis;Pain Hemiplegia - Right/Left: Right Pain - Right/Left: Right Pain - part of body: Arm   Activity Tolerance Patient tolerated treatment well   Patient Left in chair;with call bell/phone within reach(RN notified no alarm box in room)   Nurse Communication Mobility status;Need for lift equipment    Functional Assessment Tool Used: AM-PAC 6 Clicks Daily Activity   Time: [id number] OT Time Calculation (min): 40 min  Charges: OT G-codes **NOT FOR INPATIENT CLASS** Functional Assessment Tool Used: AM-PAC 6 Clicks Daily Activity OT General Charges $OT Visit: 1 Visit OT Treatments $Self Care/Home Management : 8-22 mins $Therapeutic Activity: 8-22 mins     Galen ManilaSpencer, Roxine Whittinghill Jeanette 10/10/2017, 2:24 PM

## 2017-10-10 NOTE — Progress Notes (Signed)
LTM maint complete 

## 2017-10-10 NOTE — Procedures (Signed)
  Video EEG Monitoring Report    Dates of monitoring:   10/09/16 @07 :30 to 10/10/16 @07 :30  Recording day:    3 (started on 3/12) EEG Number:    [id number] Requesting provider:   Caryl PinaLindzen, Eric, MD Interpreting physician:  Wynelle Bourgeoisan-Andrei Carolyn Maniscalco, MD  CPT:  [phone] ICD-10:  3140.901   Present History: 67 year old female with a history of intraparenchymal hematoma and focal seizures, presenting with aphasia and right sided weakness.    EEG Classification  Abnormal, Awake and sleep  PDR  7-8 Hz on the right, 7 Hz on the left  HR  70 bpm and regular irregularly irregular    Background abnormalities:   1. Continuous slow, left hemisphere      Periodic, rhythmic or epileptiform abnormalities:   1. Spikes and sharp waves, left parietal   Ictal phenomena:  none    EEG DETAILS:  TYPE OF RECORDING: At least 18-channel digital EEG with using a standard international 10-20 placement with additional EEG electrodes, and 1 additional channel dedicated to the EKG, at a sampling rate of at least 256 Hz. Video was recorded throughout the study. The recording was interpreted using digital review software allowing for montage reformatting, gain and filter changes. Each page was reviewed manually. Automatic spike and seizure detection software and quantitative analysis tools were used as needed.   Description of EEG features: During wakefulness normal voltage, mixed frequency rhythms are seen with a dominant theta>delta background and superimposed beta activity which has normal amplitude. A posterior dominant rhythm of only 7-8 Hz is observed on the right, and persistently 7 Hz on the left.   On the left, there is a fairly broad field area of additional, continuous, waxing and waning polymorphic delta slowing, maximum in the posterior temporal-parietal region.   Clinical sleep is characterized by  diffuse, medium amplitude slow delta dominant activity with overlaying waxing and waning theta rhythms and  intermittent beta, with persistent asymmetry.   Interictal Epileptiform Activity: Throughout the recording, sharp waves are seen with a maximum at T3=T5>P3. They are almost absent in wakefulness, but more frequent in sleep. In the morning of 3/14, these are very frequent, up to 1 / 5 seconds in sleep, then they do decrease in frequency. Still, overnight, in sleep, there are periods of more frequent spikes up to 1 / 10 seconds. For the majority of the recording, however, they are rare and isolated.   Push Button Events: none  Interpretation: This EEG is indicative of an area of structural abnormality and/or cortical dysfunction in the right hemisphere. Moreover, there is an epileptogenic zone in the right parietal-central region. Compared to previous epoch, there are even fewer epileptiform discharges. No seizures are recorded since 3/12 @01 :26.   The right hemisphere now displays slightly slower rhythms and a majority of the EEG is in drowsiness and sleep, suggestive of a mild diffuse encephalopathy.

## 2017-10-10 NOTE — Progress Notes (Addendum)
Subjective: No significant events overnight.   Objective: Current vital signs: BP 137/89 (BP Location: Left Arm)   Pulse 89   Temp 99 F (37.2 C) (Axillary)   Resp 18   Ht 5' 4" (1.626 m)   Wt 83.7 kg (184 lb 8.4 oz)   SpO2 96%   BMI 31.67 kg/m  Vital signs in last 24 hours: Temp:  [98.3 F (36.8 C)-100 F (37.8 C)] 99 F (37.2 C) (03/15 0500) Pulse Rate:  [73-89] 89 (03/15 0500) Resp:  [18] 18 (03/14 1617) BP: (118-142)/(60-89) 137/89 (03/15 0500) SpO2:  [96 %-100 %] 96 % (03/15 0500)  Intake/Output from previous day: 03/14 0701 - 03/15 0700 In: 610 [IV Piggyback:610] Out: 800 [Urine:800] Intake/Output this shift: No intake/output data recorded. Nutritional status: Fall precautions DIET DYS 3 Room service appropriate? Yes; Fluid consistency: Thin  Neurological Exam: Mental Status:Drowsy with mildly decreased level of alertness. She continues to exhibit prominent receptive and expressive aphasia. Predominantly word salad including nonsensical word-like utterances, with no discernible phrases today.Dysarthric. Good eye contact and attention. Makes good effort to follow pantomimed commands.  Cranial Nerves: III,IV, VI: EOMare full with some hesitancy and saccadic pursuits when gazing towards the right. No nystagmus.   VII: Decreased NL fold on right.  VIII: hearing is intact to voice Motor: 5/5 strengthLUE and LLE.No movement of RUE to noxious stimuli or spontaneously. 2/5 movement of RLE to noxious plantar stimulation. Flaccid tone of RUE, decreased tone of RLE. Other: No jerking, twitching or other adventitious movements noted   Lab Results: Results for orders placed or performed during the hospital encounter of 10/05/17 (from the past 48 hour(s))  Glucose, capillary     Status: None   Collection Time: 10/08/17 11:40 AM  Result Value Ref Range   Glucose-Capillary 91 65 - 99 mg/dL   Comment 1 Notify RN    Comment 2 Document in Chart   Glucose, capillary      Status: None   Collection Time: 10/08/17  4:28 PM  Result Value Ref Range   Glucose-Capillary 88 65 - 99 mg/dL   Comment 1 Notify RN    Comment 2 Document in Chart   Glucose, capillary     Status: Abnormal   Collection Time: 10/08/17  9:23 PM  Result Value Ref Range   Glucose-Capillary 139 (H) 65 - 99 mg/dL  Glucose, capillary     Status: None   Collection Time: 10/09/17  6:22 AM  Result Value Ref Range   Glucose-Capillary 96 65 - 99 mg/dL   Comment 1 Notify RN    Comment 2 Document in Chart   CBC     Status: None   Collection Time: 10/09/17  7:34 AM  Result Value Ref Range   WBC 9.8 4.0 - 10.5 K/uL   RBC 4.21 3.87 - 5.11 MIL/uL   Hemoglobin 12.1 12.0 - 15.0 g/dL   HCT 36.6 36.0 - 46.0 %   MCV 86.9 78.0 - 100.0 fL   MCH 28.7 26.0 - 34.0 pg   MCHC 33.1 30.0 - 36.0 g/dL   RDW 13.7 11.5 - 15.5 %   Platelets 166 150 - 400 K/uL    Comment: Performed at [facility], [place] [address] [place], [place] [address]  Comprehensive metabolic panel     Status: Abnormal   Collection Time: 10/09/17  7:34 AM  Result Value Ref Range   Sodium 137 135 - 145 mmol/L   Potassium 3.3 (L) 3.5 - 5.1 mmol/L  Chloride 107 101 - 111 mmol/L   CO2 21 (L) 22 - 32 mmol/L   Glucose, Bld 118 (H) 65 - 99 mg/dL   BUN 16 6 - 20 mg/dL   Creatinine, Ser 0.97 0.44 - 1.00 mg/dL   Calcium 8.7 (L) 8.9 - 10.3 mg/dL   Total Protein 6.6 6.5 - 8.1 g/dL   Albumin 3.0 (L) 3.5 - 5.0 g/dL   AST 17 15 - 41 U/L   ALT 13 (L) 14 - 54 U/L   Alkaline Phosphatase 52 38 - 126 U/L   Total Bilirubin 0.5 0.3 - 1.2 mg/dL   GFR calc non Af Amer 59 (L) >60 mL/min   GFR calc Af Amer >60 >60 mL/min    Comment: (NOTE) The eGFR has been calculated using the CKD EPI equation. This calculation has not been validated in all clinical situations. eGFR's persistently <60 mL/min signify possible Chronic Kidney Disease.    Anion gap 9 5 - 15    Comment: Performed at [facility] [address] [place], [place] [address]   Procalcitonin - Baseline     Status: None   Collection Time: 10/09/17  7:34 AM  Result Value Ref Range   Procalcitonin <0.10 ng/mL    Comment:        Interpretation: PCT (Procalcitonin) <= 0.5 ng/mL: Systemic infection (sepsis) is not likely. Local bacterial infection is possible. (NOTE)       Sepsis PCT Algorithm           Lower Respiratory Tract                                      Infection PCT Algorithm    ----------------------------     ----------------------------         PCT < 0.25 ng/mL                PCT < 0.10 ng/mL         Strongly encourage             Strongly discourage   discontinuation of antibiotics    initiation of antibiotics    ----------------------------     -----------------------------       PCT 0.25 - 0.50 ng/mL            PCT 0.10 - 0.25 ng/mL               OR       >80% decrease in PCT            Discourage initiation of                                            antibiotics      Encourage discontinuation           of antibiotics    ----------------------------     -----------------------------         PCT >= 0.50 ng/mL              PCT 0.26 - 0.50 ng/mL               AND        <80% decrease in PCT             Encourage initiation of  antibiotics       Encourage continuation           of antibiotics    ----------------------------     -----------------------------        PCT >= 0.50 ng/mL                  PCT > 0.50 ng/mL               AND         increase in PCT                  Strongly encourage                                      initiation of antibiotics    Strongly encourage escalation           of antibiotics                                     -----------------------------                                           PCT <= 0.25 ng/mL                                                 OR                                        > 80% decrease in PCT                                     Discontinue / Do  not initiate                                             antibiotics Performed at [facility], [address], [place], [place] [address]   Glucose, capillary     Status: Abnormal   Collection Time: 10/09/17 11:32 AM  Result Value Ref Range   Glucose-Capillary 110 (H) 65 - 99 mg/dL  Glucose, capillary     Status: Abnormal   Collection Time: 10/09/17  4:12 PM  Result Value Ref Range   Glucose-Capillary 103 (H) 65 - 99 mg/dL   Comment 1 Notify RN    Comment 2 Document in Chart   Glucose, capillary     Status: Abnormal   Collection Time: 10/09/17 10:05 PM  Result Value Ref Range   Glucose-Capillary 103 (H) 65 - 99 mg/dL    No results found for this or any previous visit (from the past 240 hour(s)).  Lipid Panel No results for input(s): CHOL, TRIG, HDL, CHOLHDL, VLDL, LDLCALC in the last 72 hours.  Studies/Results: Dg Chest Port 1 View  Result Date: 10/09/2017 CLINICAL DATA:  Fever. EXAM: PORTABLE  CHEST 1 VIEW COMPARISON:  No recent prior. FINDINGS: Mediastinum hilar structures normal. Heart size normal. No pulmonary venous congestion. Cardiac pacer with lead tips over the right atrium right ventricle. Surgical clips right upper quadrant. IMPRESSION: Cardiac pacer noted with lead tips over the right atrium right ventricle. Heart size normal. No pulmonary venous congestion. 2.  Low lung volumes with mild basilar atelectasis P Electronically Signed   By: Tower   On: 10/09/2017 09:55    Medications:  Scheduled: . aspirin EC  81 mg Oral Daily  . atorvastatin  10 mg Oral q1800  . cholecalciferol  1,000 Units Oral q morning - 10a  . enoxaparin (LOVENOX) injection  40 mg Subcutaneous Q24H  . ferrous sulfate  325 mg Oral Q breakfast  . levothyroxine  100 mcg Oral Daily  . mirtazapine  15 mg Oral QHS  . phenytoin (DILANTIN) IV  100 mg Intravenous Q8H  . potassium chloride  40 mEq Oral BID  . pregabalin  25 mg Oral Daily  . senna-docusate  1 tablet Oral BID    Continuous: . lacosamide (VIMPAT) IV 200 mg (10/10/17 0949)  . levETIRAcetam Stopped (10/10/17 0327)  . valproate sodium Stopped (10/10/17 0617)    EEG /14/18 _0 :30 to 10/10/16 _1 :30: Description of EEG features: During wakefulness normal voltage, mixed frequency rhythms are seen with a dominant theta>delta background and superimposed beta activity which has normal amplitude. A posterior dominant rhythm of only 7-8 Hz is observed on the right, and persistently 7 Hz on the left. On the left, there is a fairly broad field area of additional, continuous, waxing and waning polymorphic delta slowing, maximum in the posterior temporal-parietal region. Clinical sleep is characterized by  diffuse, medium amplitude slow delta dominant activity with overlaying waxing and waning theta rhythms and intermittent beta, with persistent asymmetry. Interictal Epileptiform Activity: Throughout the recording, sharp waves are seen with a maximum at T3=T5>P3. They are almost absent in wakefulness, but more frequent in sleep. In the morning of 3/14, these are very frequent, up to 1 / 5 seconds in sleep, then they do decrease in frequency. Still, overnight, in sleep, there are periods of more frequent spikes up to 1 / 10 seconds. For the majority of the recording, however, they are rare and isolated. Interpretation: This EEG is indicative of an area of structural abnormality and/or cortical dysfunction in the right hemisphere. Moreover, there is an epileptogenic zone in the right parietal-central region. Compared to previous epoch, there are even fewer epileptiform discharges. No seizures are recorded since 3/12 _2 :26. The right hemisphere now displays slightly slower rhythms and a majority of the EEG is in drowsiness and sleep, suggestive of a mild diffuse encephalopathy.   Assessment:67 year old female with a history of intraparenchymal hematoma who presented with aphasia and right-sided weakness at [facility] in  January and was felt to have had seizure activity as a cause. She presented with a very similar presentation on Sunday.EEG showed significant cortical irritability but no ongoing seizure. She was loaded with Dilantin in addition to her home Keppra. Currently on Dilantin, Vimpat, Keppra and Valproic acid.  1. Today's exam with continued aphasia, clinically no change compared to yesterday. No adventitious movements to suggest ongoing motor seizure, but focal seizure involving speech is high on the DDx. 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 Right sided weakness is currently at her baseline of weakness secondary to prior left MCA infarction, based  upon description by her daughter of her best baseline following the MCA stroke last year.  2. Of note, CTA/CTP on 3/10 showed increased cortical blood flow throughout the posterior left cerebral hemisphere,suggestive of physiological response toseizure activity. LTM EEG does exhibit continued improvement over several days of the previously seen diffuse encephalopathy, however clinically she has not shown improvement regarding her aphasia. Will continue LTM EEG and hope she will show some clinical improvement concordant with her EEG findings. 2. History of left parietal cortical infarction 3. Possible new onset of atrial fibrillation 4. Dilantin level from 3/11 was therapeutic at 18.1 5. Fevers 6. Obtained further information regarding the patient's baseline from her daughter today. At her best baseline, RUE motor function consisted of shoulder shrug only, with otherwise plegic RUE. Her best RLE function consisted of being able to flex at the hip antigravity while in seated position, raising foot off the ground by about 1 inch; knee extension was antigravity in seated position, able to extend off floor from seated position by about 2 inches. Speech at  baseline was able to carry on a simple conversation and ask for things, without garbling or word salad. Based upon her daughter's description, the patient is currently at her baseline motor function, with speech still significantly impaired.   Recommendations: 1. Continue Keppra at home dose of 2000 mg BID (may have been taking incorrectly at lower dose at home).  2. Continue Dilantin at 100 mg TID.  3.Continue Vimpat to 200 mg BID. 4. Continue LTM.  5. Added Depakote yesterday short term in attempt to break seizures - Loaded yesterday with 20 mg/kg once then scheduled 5 mg/kg q8h (15 mg/kg/day) beginning 8 hours after load. Avoiding phenobarbital and benzodiazepines due to oversedation risk. 6. Fevers - infectious workup begun per primary. Consider drug fever as well. Dilantin started 3/10 which can be associated and coincides with timing of fever spikes.  60 minutes spent in the evaluation today, including >50% of time discussing the patient's neurological diagnosis and condition with her daughter.   Addendum: Discussed possible burst suppression protocol with family as one option to attempt to break what are most llkely recurrent subclinical seizures resulting in her waxing/waning drowsiness and dysphasia. Family would prefer the other option I discussed with them - the more conservative strategy of titrating seizure meds over the weekend, followed by reconsidering burst suppression on Monday or Tuesday if she has not returned to baseline. Valproic acid dosing has been increased to 10 mg/kg TID. Obtaining Dilantin and valproic acid levels.    LOS: 5 days   @Electronically signed: Dr.  @ 10/10/2017  9:54 AM   

## 2017-10-11 ENCOUNTER — Inpatient Hospital Stay: Payer: Self-pay

## 2017-10-11 DIAGNOSIS — R509 Fever, unspecified: Secondary | ICD-10-CM

## 2017-10-11 MED ORDER — SODIUM CHLORIDE 0.9% FLUSH
10.0000 mL | Freq: Two times a day (BID) | INTRAVENOUS | Status: DC
  Administered 2017-10-11 – 2017-10-15 (×8): 10 mL

## 2017-10-11 NOTE — Progress Notes (Addendum)
Peripherally Inserted Central Catheter/Midline Placement  The IV Nurse has discussed with the patient and/or persons authorized to consent for the patient, the purpose of this procedure and the potential benefits and risks involved with this procedure.  The benefits include less needle sticks, lab draws from the catheter, and the patient may be discharged home with the catheter. Risks include, but not limited to, infection, bleeding, blood clot (thrombus formation), and puncture of an artery; nerve damage and irregular heartbeat and possibility to perform a PICC exchange if needed/ordered by physician.  Alternatives to this procedure were also discussed.  Bard Power PICC patient education guide, fact sheet on infection prevention and patient information card has been provided to patient /or left at bedside. Consent obtained via Telephone call from Daughter. Missy Baksh RN/Theresa RN Verifying  PICC/Midline Placement Documentation  PICC Double Lumen 10/11/17 PICC Right Brachial 39 cm 0 cm (Active)  Indication for Insertion or Continuance of Line Prolonged intravenous therapies 10/11/2017 12:00 PM  Exposed Catheter (cm) 0 cm 10/11/2017 12:00 PM  Site Assessment Clean;Intact;Dry 10/11/2017 12:00 PM  Lumen #1 Status Flushed;Saline locked;Blood return noted 10/11/2017 12:00 PM  Lumen #2 Status Flushed;Saline locked;Blood return noted 10/11/2017 12:00 PM  Dressing Type Transparent;Non-occlusive 10/11/2017 12:00 PM  Dressing Status Clean;Dry;Intact;Antimicrobial disc in place 10/11/2017 12:00 PM  Line Care Connections checked and tightened 10/11/2017 12:00 PM  Line Adjustment (NICU/IV Team Only) No 10/11/2017 12:00 PM  Dressing Intervention New dressing 10/11/2017 12:00 PM  Dressing Change Due 10/18/17 10/11/2017 12:00 PM       Jani Filesonald G Panagiota Perfetti 10/11/2017, 12:45 PM

## 2017-10-11 NOTE — Progress Notes (Signed)
Continuous VEEG monitoring in progress. Pt's scalp checked for skin down, none noted. Electrodes reset as needed.  Impedances within acceptable range.

## 2017-10-11 NOTE — Progress Notes (Signed)
OT Cancellation Note  Patient Details Name: Kathleen ChickRovena Jensen MRN: [id number] DOB: July 10, 1951   Cancelled Treatment:    Reason Eval/Treat Not Completed: Patient at procedure or test/ unavailable(Continuous VEEG monitoring in progress)  Harolyn [name], [name]  [phone] 10/11/2017, 11:16 AM

## 2017-10-11 NOTE — Progress Notes (Signed)
[name], female   DOB: 06-27-1951, 67 y.o.   MRN: [id number]  PROGRESS NOTE    Alayasia Breeding  WUJ:[id number] DOB: 1951-02-28 DOA: 10/05/2017 PCP: Donato Schultz, DO   Brief Narrative:  67 year old female with history of ICH, seizure, hypertension, hemiparesis, chronic kidney disease stage III, hyperlipidemia, hypothyroidism presented 10/05/2017 with acute aphasia.  Neurology was consulted who opined that she had subclinical seizures.   Assessment & Plan:   Principal Problem:   Stroke-like symptoms Active Problems:   Hypothyroidism   OBESITY, MORBID   OBSTRUCTIVE SLEEP APNEA   Essential hypertension   Chronic kidney disease (CKD), stage III (moderate) (HCC)   Hyperlipidemia  Recurrent subclinical seizures  -Neurology following.  Greatly appreciated. -EEG done on 10/05/2017 revealed: Left posterior quadrant sharp waves and left posterior quadrant slow activity -Continuous EEG in progress -Continue Keppra, Dilantin, Depacon and Vimpat doses as recommended by neurology.  Expressive aphasia in the setting of subclinical seizures and old CVA -Neurology following -Code stroke CT head done on 10/05/2017 revealed old stroke and no acute intracranial findings -Fall precautions -Diet as per SLP recommendations  Fever -Probably drug fever from Dilantin.  No signs of infection with negative chest x-ray, no leukocytosis and negative blood cultures.   -Improving.  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  Currently not on antihypertensives  Polyneuropathy -Continue Lyrica  Depression -Continue mirtazapine  Chronic constipation -Continue Senokot  Obesity BMI 31 Weight loss when more stable  outpatient    DVT prophylaxis: Lovenox Code Status: Full Family Communication: None at bedside Disposition Plan: Depends on clinical outcome  Consultants: Neurology  Procedures: EEG/continuous EEG  Antimicrobials: None   Subjective: Patient seen and examined at bedside.  She is awake but hardly answers any questions.  Objective: Vitals:   10/10/17 2014 10/10/17 2353 10/11/17 0449 10/11/17 0740  BP: (!) 153/68 (!) 153/85 (!) 143/80 115/88  Pulse: 60 74 70 70  Resp:      Temp: 98.9 F (37.2 C) 99.7 F (37.6 C) 98.7 F (37.1 C) 99.4 F (37.4 C)  TempSrc: Oral Oral Oral Oral  SpO2: 100% 100% 100% 95%  Weight:      Height:        Intake/Output Summary (Last 24 hours) at 10/11/2017 0945 Last data filed at 10/11/2017 0755 Gross per 24 hour  Intake 386.63 ml  Output -  Net 386.63 ml   Filed Weights   10/05/17 1746  Weight: 83.7 kg (184 lb 8.4 oz)    Examination:  General exam: Appears older than stated age.  Lying in bed.  Dysarthric. Respiratory system: Bilateral decreased breath sound at bases Cardiovascular system: S1 & S2 heard, rate controlled  gastrointestinal system: Abdomen is nondistended, soft and nontender. Normal bowel sounds heard. Extremities: No cyanosis, clubbing; trace edema    Data Reviewed: I have personally reviewed following labs and imaging studies  CBC: Recent Labs  Lab 10/05/17 1250 10/09/17 0734  WBC 7.8 9.8  NEUTROABS 3.9  --   HGB 12.9 12.1  HCT 38.3 36.6  MCV 85.9 86.9  PLT 199 166   Basic Metabolic Panel: Recent Labs  Lab 10/05/17 1250 10/09/17 0734  NA 139 137  K 3.4* 3.3*  CL 105 107  CO2 23 21*  GLUCOSE 99 118*  BUN 12 16  CREATININE 0.75  0.97  CALCIUM 9.6 8.7*   GFR: Estimated Creatinine Clearance: 58.9 mL/min (by C-G formula based on SCr of 0.97 mg/dL). Liver Function Tests: Recent Labs  Lab 10/05/17 1250 10/09/17 0734  AST 38 17  ALT 20 13*  ALKPHOS 68 52  BILITOT 0.7 0.5  PROT 7.7 6.6  ALBUMIN  3.8 3.0*   No results for input(s): LIPASE, AMYLASE in the last 168 hours. No results for input(s): AMMONIA in the last 168 hours. Coagulation Profile: Recent Labs  Lab 10/05/17 1250  INR 1.01   Cardiac Enzymes: Recent Labs  Lab 10/05/17 1250  TROPONINI <0.03   BNP (last 3 results) No results for input(s): PROBNP in the last 8760 hours. HbA1C: No results for input(s): HGBA1C in the last 72 hours. CBG: Recent Labs  Lab 10/08/17 2123 10/09/17 0622 10/09/17 1132 10/09/17 1612 10/09/17 2205  GLUCAP 139* 96 110* 103* 103*   Lipid Profile: No results for input(s): CHOL, HDL, LDLCALC, TRIG, CHOLHDL, LDLDIRECT in the last 72 hours. Thyroid Function Tests: No results for input(s): TSH, T4TOTAL, FREET4, T3FREE, THYROIDAB in the last 72 hours. Anemia Panel: No results for input(s): VITAMINB12, FOLATE, FERRITIN, TIBC, IRON, RETICCTPCT in the last 72 hours. Sepsis Labs: Recent Labs  Lab 10/09/17 0734  PROCALCITON <0.10    Recent Results (from the past 240 hour(s))  Culture, blood (routine x 2)     Status: Abnormal (Preliminary result)   Collection Time: 10/09/17  7:25 AM  Result Value Ref Range Status   Specimen Description BLOOD RIGHT HAND  Final   Special Requests AEROCOCCUS SPECIES Blood Culture adequate volume (A)  Final   Culture   Final    NO GROWTH 1 DAY Performed at [facility], [address] [place], [place] [address]    Report Status PENDING  Incomplete  Culture, blood (routine x 2)     Status: None (Preliminary result)   Collection Time: 10/09/17  7:33 AM  Result Value Ref Range Status   Specimen Description BLOOD LEFT ANTECUBITAL  Final   Special Requests   Final    BOTTLES DRAWN AEROBIC ONLY Blood Culture adequate volume   Culture   Final    NO GROWTH 1 DAY Performed at [facility], [address] [place], [place] [address]    Report Status PENDING  Incomplete         Radiology Studies: Dg Chest Port 1 View  Result Date:  10/09/2017 CLINICAL DATA:  Fever. EXAM: PORTABLE CHEST 1 VIEW COMPARISON:  No recent prior. FINDINGS: Mediastinum hilar structures normal. Heart size normal. No pulmonary venous congestion. Cardiac pacer with lead tips over the right atrium right ventricle. Surgical clips right upper quadrant. IMPRESSION: Cardiac pacer noted with lead tips over the right atrium right ventricle. Heart size normal. No pulmonary venous congestion. 2.  Low lung volumes with mild basilar atelectasis P Electronically Signed   By: Maisie Fushomas  Register   On: 10/09/2017 09:55        Scheduled Meds: . aspirin EC  81 mg Oral Daily  . atorvastatin  10 mg Oral q1800  . cholecalciferol  1,000 Units Oral q morning - 10a  . enoxaparin (LOVENOX) injection  40 mg Subcutaneous Q24H  . ferrous sulfate  325 mg Oral Q breakfast  . levothyroxine  100 mcg Oral Daily  . mirtazapine  15 mg Oral QHS  . phenytoin (DILANTIN) IV  100 mg Intravenous Q8H  . potassium chloride  40 mEq Oral BID  . pregabalin  25 mg Oral Daily  . senna-docusate  1 tablet Oral BID   Continuous Infusions: . lacosamide (VIMPAT) IV Stopped (10/10/17 2336)  . levETIRAcetam Stopped (10/11/17 0545)  . valproate sodium 663 mg (10/11/17 2956)     LOS: 6 days        Glade Lloyd, MD [facility] Pager [phone]  If 7PM-7AM, please contact night-coverage [email or web address] Password [facility] 10/11/2017, 9:45 AM

## 2017-10-11 NOTE — Progress Notes (Signed)
Subjective: Had some episodes of coherent speech yesterday, per nursing. This AM with incoherent speech.   Objective: Current vital signs: BP (!) 143/80 (BP Location: Left Arm)   Pulse 70   Temp 98.7 F (37.1 C) (Oral)   Resp 20   Ht _0  (1.626 m)   Wt 83.7 kg (184 lb 8.4 oz)   SpO2 100%   BMI 31.67 kg/m  Vital signs in last 24 hours: Temp:  [98 F (36.7 C)-99.7 F (37.6 C)] 98.7 F (37.1 C) (03/16 0449) Pulse Rate:  [58-74] 70 (03/16 0449) Resp:  [16-20] 20 (03/15 1722) BP: (111-154)/(60-85) 143/80 (03/16 0449) SpO2:  [98 %-100 %] 100 % (03/16 0449)  Intake/Output from previous day: 03/15 0701 - 03/16 0700 In: 266.6 [IV Piggyback:266.6] Out: -  Intake/Output this shift: No intake/output data recorded. Nutritional status: Fall precautions DIET DYS 3 Room service appropriate? Yes; Fluid consistency: Thin  Neurological Exam: Mental Status:Drowsy with mildly decreased level of alertness. She continues to exhibit prominent receptive and expressive aphasia. Predominantly word salad including nonsensical word-like utterances, with nodiscernible phrasestoday.Dysarthric. Good eye contact and attention. Makes good effort to follow pantomimed commands.  Cranial Nerves: III,IV, VI: EOMare full with some hesitancy and saccadic pursuits when gazing towards the right. No nystagmus.  VII: Decreased NL fold on right.  VIII: hearing is intact to voice Motor: 5/5 strengthLUE and LLE.Able to weakly wiggle fingers of right hand today, which is an improvement from yesterday; otherwise, no movement of RUE to noxious stimuli or spontaneously. 2/5 movement of RLE to noxious plantar stimulation.Flaccid tone of RUE, decreased tone of RLE. Other:No jerking, twitching or other adventitious movements noted   Lab Results: Results for orders placed or performed during the hospital encounter of 10/05/17 (from the past 48 hour(s))  Culture, blood (routine x 2)     Status: None  (Preliminary result)   Collection Time: 10/09/17  7:33 AM  Result Value Ref Range   Specimen Description BLOOD LEFT ANTECUBITAL    Special Requests      BOTTLES DRAWN AEROBIC ONLY Blood Culture adequate volume   Culture      NO GROWTH 1 DAY Performed at [facility] [address], [place], [place] [address]    Report Status PENDING   CBC     Status: None   Collection Time: 10/09/17  7:34 AM  Result Value Ref Range   WBC 9.8 4.0 - 10.5 K/uL   RBC 4.21 3.87 - 5.11 MIL/uL   Hemoglobin 12.1 12.0 - 15.0 g/dL   HCT 36.6 36.0 - 46.0 %   MCV 86.9 78.0 - 100.0 fL   MCH 28.7 26.0 - 34.0 pg   MCHC 33.1 30.0 - 36.0 g/dL   RDW 13.7 11.5 - 15.5 %   Platelets 166 150 - 400 K/uL    Comment: Performed at [facility], [place] [address], [place], [place] [address]  Comprehensive metabolic panel     Status: Abnormal   Collection Time: 10/09/17  7:34 AM  Result Value Ref Range   Sodium 137 135 - 145 mmol/L   Potassium 3.3 (L) 3.5 - 5.1 mmol/L   Chloride 107 101 - 111 mmol/L   CO2 21 (L) 22 - 32 mmol/L   Glucose, Bld 118 (H) 65 - 99 mg/dL   BUN 16 6 - 20 mg/dL   Creatinine, Ser 0.97 0.44 - 1.00 mg/dL   Calcium 8.7 (L) 8.9 - 10.3 mg/dL   Total Protein 6.6 6.5 -  8.1 g/dL   Albumin 3.0 (L) 3.5 - 5.0 g/dL   AST 17 15 - 41 U/L   ALT 13 (L) 14 - 54 U/L   Alkaline Phosphatase 52 38 - 126 U/L   Total Bilirubin 0.5 0.3 - 1.2 mg/dL   GFR calc non Af Amer 59 (L) >60 mL/min   GFR calc Af Amer >60 >60 mL/min    Comment: (NOTE) The eGFR has been calculated using the CKD EPI equation. This calculation has not been validated in all clinical situations. eGFR's persistently <60 mL/min signify possible Chronic Kidney Disease.    Anion gap 9 5 - 15    Comment: Performed at [facility] [address], [place], [place] [address]  Procalcitonin - Baseline     Status: None   Collection Time: 10/09/17  7:34 AM  Result Value Ref Range   Procalcitonin <0.10 ng/mL    Comment:         Interpretation: PCT (Procalcitonin) <= 0.5 ng/mL: Systemic infection (sepsis) is not likely. Local bacterial infection is possible. (NOTE)       Sepsis PCT Algorithm           Lower Respiratory Tract                                      Infection PCT Algorithm    ----------------------------     ----------------------------         PCT < 0.25 ng/mL                PCT < 0.10 ng/mL         Strongly encourage             Strongly discourage   discontinuation of antibiotics    initiation of antibiotics    ----------------------------     -----------------------------       PCT 0.25 - 0.50 ng/mL            PCT 0.10 - 0.25 ng/mL               OR       >80% decrease in PCT            Discourage initiation of                                            antibiotics      Encourage discontinuation           of antibiotics    ----------------------------     -----------------------------         PCT >= 0.50 ng/mL              PCT 0.26 - 0.50 ng/mL               AND        <80% decrease in PCT             Encourage initiation of                                             antibiotics       Encourage continuation  of antibiotics    ----------------------------     -----------------------------        PCT >= 0.50 ng/mL                  PCT > 0.50 ng/mL               AND         increase in PCT                  Strongly encourage                                      initiation of antibiotics    Strongly encourage escalation           of antibiotics                                     -----------------------------                                           PCT <= 0.25 ng/mL                                                 OR                                        > 80% decrease in PCT                                     Discontinue / Do not initiate                                             antibiotics Performed at [facility], [address], [place], [place] [address]    Glucose, capillary     Status: Abnormal   Collection Time: 10/09/17 11:32 AM  Result Value Ref Range   Glucose-Capillary 110 (H) 65 - 99 mg/dL  Glucose, capillary     Status: Abnormal   Collection Time: 10/09/17  4:12 PM  Result Value Ref Range   Glucose-Capillary 103 (H) 65 - 99 mg/dL   Comment 1 Notify RN    Comment 2 Document in Chart   Glucose, capillary     Status: Abnormal   Collection Time: 10/09/17 10:05 PM  Result Value Ref Range   Glucose-Capillary 103 (H) 65 - 99 mg/dL  Valproic acid level     Status: None   Collection Time: 10/10/17  7:22 PM  Result Value Ref Range   Valproic Acid Lvl 65 50.0 - 100.0 ug/mL    Comment: Performed at [facility], [place] [address], [place], [place] [address]  Phenytoin level, total     Status: Abnormal   Collection Time: 10/10/17  7:22 PM  Result Value  Ref Range   Phenytoin Lvl 6.7 (L) 10.0 - 20.0 ug/mL    Comment: Performed at [facility], [place] [address], [place], [place] [address]    Recent Results (from the past 240 hour(s))  Culture, blood (routine x 2)     Status: Abnormal (Preliminary result)   Collection Time: 10/09/17  7:25 AM  Result Value Ref Range Status   Specimen Description BLOOD BLOOD RIGHT HAND  Final   Special Requests AEROCOCCUS SPECIES Blood Culture adequate volume (A)  Final   Culture   Final    NO GROWTH 1 DAY Performed at [facility], [place] [address] [place], [place] [address]    Report Status PENDING  Incomplete  Culture, blood (routine x 2)     Status: None (Preliminary result)   Collection Time: 10/09/17  7:33 AM  Result Value Ref Range Status   Specimen Description BLOOD LEFT ANTECUBITAL  Final   Special Requests   Final    BOTTLES DRAWN AEROBIC ONLY Blood Culture adequate volume   Culture   Final    NO GROWTH 1 DAY Performed at [facility], [place] [address] [place],  [address]    Report Status PENDING  Incomplete    Lipid Panel No results for input(s): CHOL, TRIG,  HDL, CHOLHDL, VLDL, LDLCALC in the last 72 hours.  Studies/Results: Dg Chest Port 1 View  Result Date: 10/09/2017 CLINICAL DATA:  Fever. EXAM: PORTABLE CHEST 1 VIEW COMPARISON:  No recent prior. FINDINGS: Mediastinum hilar structures normal. Heart size normal. No pulmonary venous congestion. Cardiac pacer with lead tips over the right atrium right ventricle. Surgical clips right upper quadrant. IMPRESSION: Cardiac pacer noted with lead tips over the right atrium right ventricle. Heart size normal. No pulmonary venous congestion. 2.  Low lung volumes with mild basilar atelectasis P Electronically Signed   By: [place]   On: 10/09/2017 09:55    Medications:  Scheduled: . aspirin EC  81 mg Oral Daily  . atorvastatin  10 mg Oral q1800  . cholecalciferol  1,000 Units Oral q morning - 10a  . enoxaparin (LOVENOX) injection  40 mg Subcutaneous Q24H  . ferrous sulfate  325 mg Oral Q breakfast  . levothyroxine  100 mcg Oral Daily  . mirtazapine  15 mg Oral QHS  . phenytoin (DILANTIN) IV  100 mg Intravenous Q8H  . potassium chloride  40 mEq Oral BID  . pregabalin  25 mg Oral Daily  . senna-docusate  1 tablet Oral BID   Continuous: . lacosamide (VIMPAT) IV Stopped (10/10/17 2336)  . levETIRAcetam Stopped (10/11/17 0545)  . valproate sodium 663 mg (10/11/17 2094)    LTM EEG DAY #4: from 0730 10/10/17 to 0730 10/11/17:  BACKGROUND: An overall medium voltage continuous recording with good spontaneous variability and reactivity. Waking background consisted of a medium voltage '8Hz'$  posterior dominant rhythm predominantly over the right with low voltage beta activity in the frontocentral regions (decreased on the left) and frequent medium voltage theta-delta activity diffusely. Slow activity over the left was generally of slower frequencies as compared to the right. Sleep was captured with some sleep spindles at times. EPILEPTIFORM/PERIODIC ACTIVITY: There were occasional isolated spikes  multifocally in the left hemisphere (temporal, frontal, parietal). These showed no ictal evolution and were unchanged in abundance compared to the previous day. SEIZURES: noneEVENTS: noneSUMMARY: This was an abnormal continuous video EEG due to left hemispheric focal slowing with superimposed epileptiform discharges, indicative of a focal cerebral  disturbance with epileptogenic potential. No seizures were seen. There was also evidence of a superimposed mild global cerebral disturbance.  Assessment:67 year old female with a history of intraparenchymal hematoma who presented with aphasia and right-sided weakness at [facility] in January and was felt to have had seizure activity as a cause. She presented with a very similar presentation on Sunday.EEG showed significant cortical irritability but no ongoing seizure. She was loaded with Dilantin in addition to her home Keppra. Currently on Dilantin, Vimpat, Keppra and Valproic acid.  1. Today's exam with continued aphasia, clinically no change compared to yesterday. No adventitious movements to suggest ongoing motor seizure, but focal seizure involving speech is high on the DDx. 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 There has been some improvement clinically and on EEG with addition of valproic acid. Right sided weakness is currently at her baseline of weakness secondary to prior left MCA infarction, based upon description by her daughter of her best baseline following the left MCA stroke last year.  2. Of note, CTA/CTP on 3/10 showed increased cortical blood flow throughout the posterior left cerebral hemisphere,suggestive of physiological response toseizure activity.LTM EEG does exhibit continued improvement over several days of the previously seen diffuse encephalopathy, however clinically she has not shown improvement regarding her aphasia.  Will continue LTM EEG and hope she will show some clinical improvement concordant with her EEG findings on her 4 anticonvulsants. Most recent change was increased valproic acid dose to 10 mg/kg TID. Most recent Dilantin level from yesterday was 6.7 (corrected for albumin the level is 7.2). Valproic acid level therapeutic at 65. Discussed possible burst suppression protocol with family on Friday as one option to attempt to break what are most llkely recurrent subclinical seizures resulting in her waxing/waning drowsiness and dysphasia. Family would prefer the other option I discussed with them - the more conservative strategy of titrating seizure meds over the weekend, followed by reconsidering burst suppression on Monday or Tuesday if she has not returned to baseline.   3. Possible new onset of atrial fibrillation. Most recent LTM EEG tracings (3/15-3/16) show left hemispheric focal slowing with superimposed epileptiform discharges, indicative of a focal cerebral disturbance with epileptogenic potential. No seizures were seen. There was also evidence of a superimposed mild global cerebral disturbance. 4. Obtained further information regarding the patient's baseline from her daughter. At her best baseline, RUE motor function consisted of shoulder shrug only, with otherwise plegic RUE. Her best RLE function consisted of being able to flex at the hip antigravity while in seated position, raising foot off the ground by about 1 inch; knee extension was antigravity in seated position, able to extend off floor from seated position by about 2 inches. Speech at baseline was able to carry on a simple conversation and ask for things, without garbling or word salad. Based upon her daughter's description, the patient is currently at her baseline motor function, with speech still significantly impaired.   Recommendations: 1. Continue Keppra at home dose of 2000 mg BID (may have been taking incorrectly at lower dose at  home).  2. Continue Dilantin at 100 mg TID.  3.ContinueVimpat to 200 mg BID. 4. Continue Depakote at 10 mg/kg TID.  5.Avoiding phenobarbital and benzodiazepines due to oversedation risk. 6. Continue LTM.     LOS: 6 days   _0  signed: Dr. Kerney Elbe 10/11/2017  7:29 AM

## 2017-10-11 NOTE — Procedures (Signed)
LTM-EEG Report   HISTORY: Continuous video-EEG monitoring performed for 67 year old with ICH, aphasia, and seizures.  ACQUISITION: International 10-20 system for electrode placement; 18 channels with additional eyes linked to ipsilateral ears and EKG. Additional T1-T2 electrodes were used. Continuous video recording obtained.    EEG NUMBER: [id number] MEDICATIONS:  Day 4: LEV, LCM, VPA, PHT, PGB    DAY #4: from 0730 10/10/17 to 0730 10/11/17  BACKGROUND: An overall medium voltage continuous recording with good spontaneous variability and reactivity. Waking background consisted of a medium voltage 8Hz  posterior dominant rhythm predominantly over the right with low voltage beta activity in the frontocentral regions (decreased on the left) and frequent medium voltage theta-delta activity diffusely. Slow activity over the left was generally of slower frequencies as compared to the right. Sleep was captured with some sleep spindles at times.  EPILEPTIFORM/PERIODIC ACTIVITY: There were occasional isolated spikes multifocally in the left hemisphere (temporal, frontal, parietal). These showed no ictal evolution and were unchanged in abundance compared to the previous day. SEIZURES: none  EVENTS: none    EKG: no significant arrhythmia   SUMMARY: This was an abnormal continuous video EEG due to left hemispheric focal slowing with superimposed epileptiform discharges, indicative of a focal cerebral disturbance with epileptogenic potential. No seizures were seen. There was also evidence of a superimposed mild global cerebral disturbance.

## 2017-10-11 NOTE — Progress Notes (Signed)
IV team just placed a 22G in the LFA. Pt had a IV in the left hand and arm was swollen. RN received Pt at 2315 and wasn't sure if that's how it has been. Pt hand was hurting. Disconnected IV it was KVO and Pt had felt better. Elevated hand. Swelling went down a little. Will pass on in shift change.

## 2017-10-12 LAB — CBC WITH DIFFERENTIAL/PLATELET
Basophils Absolute: 0 10*3/uL (ref 0.0–0.1)
Basophils Relative: 0 %
EOS ABS: 0.5 10*3/uL (ref 0.0–0.7)
Eosinophils Relative: 8 %
HCT: 34.5 % — ABNORMAL LOW (ref 36.0–46.0)
Hemoglobin: 11.1 g/dL — ABNORMAL LOW (ref 12.0–15.0)
LYMPHS ABS: 2.3 10*3/uL (ref 0.7–4.0)
LYMPHS PCT: 35 %
MCH: 28.1 pg (ref 26.0–34.0)
MCHC: 32.2 g/dL (ref 30.0–36.0)
MCV: 87.3 fL (ref 78.0–100.0)
MONOS PCT: 8 %
Monocytes Absolute: 0.6 10*3/uL (ref 0.1–1.0)
Neutro Abs: 3.2 10*3/uL (ref 1.7–7.7)
Neutrophils Relative %: 49 %
Platelets: 143 10*3/uL — ABNORMAL LOW (ref 150–400)
RBC: 3.95 MIL/uL (ref 3.87–5.11)
RDW: 13.8 % (ref 11.5–15.5)
WBC: 6.6 10*3/uL (ref 4.0–10.5)

## 2017-10-12 LAB — BASIC METABOLIC PANEL
Anion gap: 8 (ref 5–15)
BUN: 12 mg/dL (ref 6–20)
CHLORIDE: 115 mmol/L — AB (ref 101–111)
CO2: 19 mmol/L — AB (ref 22–32)
CREATININE: 0.79 mg/dL (ref 0.44–1.00)
Calcium: 8.8 mg/dL — ABNORMAL LOW (ref 8.9–10.3)
GFR calc Af Amer: >60 mL/min (ref >60)
GFR calc non Af Amer: >60 mL/min (ref >60)
GLUCOSE: 78 mg/dL (ref 65–99)
Potassium: 4.2 mmol/L (ref 3.5–5.1)
SODIUM: 142 mmol/L (ref 135–145)

## 2017-10-12 LAB — MAGNESIUM: MAGNESIUM: 1.8 mg/dL (ref 1.7–2.4)

## 2017-10-12 LAB — VALPROIC ACID LEVEL: Valproic Acid Lvl: 71 ug/mL (ref 50.0–100.0)

## 2017-10-12 LAB — PHENYTOIN LEVEL, TOTAL: Phenytoin Lvl: 3.9 ug/mL — ABNORMAL LOW (ref 10.0–20.0)

## 2017-10-12 NOTE — Progress Notes (Signed)
LTM continues; checked electrodes, reset as needed. No skin break down noted.

## 2017-10-12 NOTE — Progress Notes (Addendum)
Neurology Progress Note  Subjective: No family at bedside. Appears comfortable without pain. Per RN patient can occasionally speak a few understandable 2-3 word sentences but continues to have mostly nonsense speech. No new events or seizure activity reported overnight. LTM in progress. Aide feeding patient breakfast  Objective: Current vital signs: BP (!) 142/70 (BP Location: Left Arm) Comment: [id number]  Pulse (!) 59   Temp 98.5 F (36.9 C) (Axillary)   Resp 16   Ht 5\' 4"  (1.626 m)   Wt 83.7 kg (184 lb 8.4 oz)   SpO2 99%   BMI 31.67 kg/m  Vital signs in last 24 hours: Temp:  [98 F (36.7 C)-99.4 F (37.4 C)] 98.5 F (36.9 C) (03/17 0416) Pulse Rate:  [59-71] 59 (03/17 0416) Resp:  [16] 16 (03/16 2135) BP: (111-142)/(56-74) 142/70 (03/17 0416) SpO2:  [96 %-99 %] 99 % (03/17 0416)  Neurological Exam: Mental Status:Drowsy, but easily arouses to voice. If not stimulated, will fall back to sleep. Attentive but follows no direct commands and will not mimic examiner. Very flat affect.  She continues to exhibit prominent receptive and expressive aphasia. Predominantly word salad including nonsensical word-like utterances, but does show some slight improvement on exam today based on my discussion with Dr Otelia LimesLindzen who last examined the patient. When attempting to sing "Happy [place] Ouch[place]irthday" with her, she says, "I haven't been out with anybody, I haven't talked to anybody". She then continues to speak sentences that do not make sense. Good eye contact and attention. Makes good effort to speak but will not sing. Cranial Nerves: III,IV, VI: EOMare full with some hesitancy and saccadic pursuits when gazing towards the right. No nystagmus.  VII: Decreased NL fold on right.  VIII: hearing is intact to voice Motor: 5/5 strengthLUE and LLE.Continues to be able to weakly wiggle fingers of right hand today; otherwise, no movement of RUE to noxious stimuli or spontaneously. 2/5 movement of RLE to noxious  plantar stimulation.Flaccid tone of RUE, decreased tone of RLE. Other:No jerking, twitching or other movements noted  Baseline Motor Findings prior to admission per telephone discussion with family: RUE motor function consisted of shoulder shrug only, with otherwise plegic RUE. Her best RLE function consisted of being able to flex at the hip antigravity while in seated position, raising foot off the ground by about 1 inch; knee extension was antigravity in seated position, able to extend off floor from seated position by about 2 inches. Baseline Speech Findings: Was able to carry on a simple conversation and ask for things, without garbling or word salad.  LTM EEG DAY #5: from 0730 10/10/17 to 0730 10/11/17:  BACKGROUND: An overall medium voltage continuous recording with good spontaneous variability and reactivity. Waking background consisted of a medium voltage8 Hz posterior dominant rhythmpredominantly over the rightwith low voltage beta activity in thefrontocentral regions(decreased on the left)and frequentmedium voltage theta-deltaactivity diffusely. Slow activity over the left was generally of slower frequencies as compared to the right.Sleep was captured withsome sleep spindles at times. EPILEPTIFORM/PERIODIC ACTIVITY:Epileptiform activity was less prominent compared to the previous day's record. SEIZURES: none,  EVENTS: noneSUMMARY: This was an abnormal continuous video EEGdue to left hemispheric focal slowing with superimposed generalized slowing. This record was improved compared to the previous day with no seizures and no apparent epileptiform discharges  LTM EEG DAY #5: from07303/16/19 to 0730 10/12/17: BACKGROUND: An overall medium voltage continuous recording with good spontaneous variability and reactivity. Waking background consisted of a medium voltage8 Hz posterior dominant rhythmpredominantly over the rightwith  low voltage beta activity in thefrontocentral  regions(decreased on the left)and frequentmedium voltage theta-deltaactivity diffusely. Slow activity over the left was generally of slower frequencies as compared to the right.Sleep was captured withsome sleep spindles at times.EPILEPTIFORM/PERIODIC ACTIVITY:Epileptiform activity was less prominent compared to the previous day's record. SEIZURES: none EVENTS: none. SUMMARY: This was an abnormal continuous video EEGdue to left hemispheric focal slowing with superimposed generalized slowing. This record was improved compared to the previous day with no seizures and no apparent epileptiform discharges.    Assessment:67 year old female with a history of intraparenchymal hematoma who presented with aphasia and right-sided weakness at [facility] in January and was felt to have had seizure activity as a cause. She presented with a very similar presentation on Sunday 3/10.Initial EEG showed significant cortical irritability but no ongoing seizure. She was loaded with Dilantin in addition to her home Keppra. Currently on Dilantin, Vimpat, Keppra and Valproic acid.  1. Today's exam with continued aphasia, clinically, minimal change compared to yesterday's exam. No adventitious movements to suggest ongoing motor seizure, but focal seizure involving speech is high on the DDx. Overall clinical pictureremainsmost consistent with postictal state secondary to subclinical seizures. There has been some steady gradual improvement clinically and on EEG this week, which continued with addition of valproic acid. Right sided weakness is currently at her baseline of weakness secondary to prior left MCA infarction, based upon description by her daughter of her best baseline following the left MCA stroke last year.   2. Of note, CTA/CTP on 3/10 showed increased cortical blood flow throughout the posterior left cerebral hemisphere,suggestive of physiological response toseizure activity.  3. LTM EEG does exhibit  continued improvement over several days of the previously seen diffuse encephalopathy, however clinically she has not shown dramatic improvement regarding her aphasia. Will continue LTM EEG and hope she will show some clinical improvement concordant with her EEG findings on her 4 anticonvulsants.   4. Most recent change was increased valproic acid dose to 10 mg/kg TID.  Most recent Dilantin level from yesterday was 6.7 (corrected for albumin the level is 7.2). Valproic acid level yesterday was therapeutic at 65. Discussed possible burst suppression protocol with family on Friday as one option to attempt to break what are most llkely recurrent subclinical seizures resulting in her waxing/waning drowsiness and dysphasia. Family would prefer the other option I discussed with them - the more conservative strategy of titrating seizure meds over the weekend, followed by reconsidering burst suppression on Monday or Tuesday if she has not returned to baseline.    5. Possible new onset of atrial fibrillation. EEG overnight showed no arrhymias   Recommendations: 1. Continue Keppra at home dose of 2000 mg BID (may have been taking     incorrectly at lower dose at home).  2. Continue Dilantin at 100 mg TID.  3.ContinueVimpat to 200 mg BID. 4. Continue Depakote at 10 mg/kg TID.  5.Avoiding phenobarbital and benzodiazepines due to oversedation risk. 6. Continue LTM. 7. May consider switching AED's to PO in AM if continues to improve    LOS: 7 days   Beryl Meager, ANP-C [facility] 10/12/2017  8:56 AM

## 2017-10-12 NOTE — Procedures (Signed)
LTM-EEG Report  HISTORY: Continuous video-EEG monitoring performed for 67 year old with ICH, aphasia, and seizures. ACQUISITION: International 10-20 system for electrode placement; 18 channels with additional eyes linked to ipsilateral ears and EKG. Additional T1-T2 electrodes were used. Continuous video recording obtained.  EEG NUMBER:[id number] MEDICATIONS: Day 4: LEV, LCM, VPA, PHT, PGB Day 5: LEV, LCM, VPA, PHT, PGB   DAY #4: from 0730 10/10/17 to 0730 10/11/17 BACKGROUND: An overall medium voltage continuous recording with good spontaneous variability and reactivity. Waking background consisted of a medium voltage 8Hz  posterior dominant rhythm predominantly over the right with low voltage beta activity in the frontocentral regions (decreased on the left) and frequent medium voltage theta-delta activity diffusely. Slow activity over the left was generally of slower frequencies as compared to the right. Sleep was captured with some sleep spindles at times.  EPILEPTIFORM/PERIODIC ACTIVITY: There were occasional isolated spikes multifocally in the left hemisphere (temporal, frontal, parietal). These showed no ictal evolution and were unchanged in abundance compared to the previous day. SEIZURES: none EVENTS: none  DAY #5: from 0730 10/11/17 to 0730 10/12/17 BACKGROUND: An overall medium voltage continuous recording with good spontaneous variability and reactivity. Waking background consisted of a medium voltage 8Hz  posterior dominant rhythm predominantly over the right with low voltage beta activity in the frontocentral regions (decreased on the left) and frequent medium voltage theta-delta activity diffusely. Slow activity over the left was generally of slower frequencies as compared to the right. Sleep was captured with some sleep spindles at times.  EPILEPTIFORM/PERIODIC ACTIVITY: Epileptiform activity was less prominent compared to the previous day's record.  SEIZURES: none EVENTS:  none  EKG: no significant arrhythmia  SUMMARY: This was an abnormal continuous video EEG due to left hemispheric focal slowing with superimposed generalized slowing. This record was improved compared to the previous day with no seizures and no apparent epileptiform discharges.

## 2017-10-12 NOTE — Progress Notes (Signed)
[name], female   DOB: 08-25-1950, 67 y.o.   MRN: [id number]  PROGRESS NOTE    Kathleen Jensen  WUJ:[id number] DOB: [birth year] DOA: 10/05/2017 PCP: Kathleen Schultz, DO   Brief Narrative:  67 year old female with history of ICH, seizure, hypertension, hemiparesis, chronic kidney disease stage III, hyperlipidemia, hypothyroidism presented 10/05/2017 with acute aphasia.  Neurology was consulted who opined that she had subclinical seizures.    Assessment & Plan:   Principal Problem:   Stroke-like symptoms Active Problems:   Hypothyroidism   OBESITY, MORBID   OBSTRUCTIVE SLEEP APNEA   Essential hypertension   Chronic kidney disease (CKD), stage III (moderate) (HCC)   Hyperlipidemia  Recurrent subclinical seizures  -Neurology following.   -EEG done on 10/05/2017 revealed: Left posterior quadrant sharp waves and left posterior quadrant slow activity -Continuous EEG in progress -Continue Keppra, Dilantin, Depacon and Vimpat doses as recommended by neurology. -Repeat a.m. labs  Expressive aphasia in the setting of subclinical seizures and old CVA -Neurology following -Code stroke CT head done on 10/05/2017 revealed old stroke and no acute intracranial findings -Fall precautions -Diet as per SLP recommendations  Fever -Probably drug fever from Dilantin.  No signs of infection with negative chest x-ray, no leukocytosis and negative blood cultures.   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  Currently not on antihypertensives  Polyneuropathy -Continue Lyrica  Depression -Continue mirtazapine  Chronic constipation -Continue Senokot  Obesity BMI 31 Weight loss when more stable  outpatient    DVT prophylaxis: Lovenox Code Status: Full Family Communication: None at bedside Disposition Plan: Depends on clinical outcome  Consultants: Neurology  Procedures: EEG/continuous EEG  Antimicrobials: None   Subjective: Patient seen and examined at bedside.  She is sleepy, hardly wakes up on calling her name.  No overnight fever or vomiting.  Continuous EEG is ongoing.  Objective: Vitals:   10/11/17 2135 10/12/17 0051 10/12/17 0416 10/12/17 0903  BP: 123/68 120/74 (!) 142/70 (!) 132/53  Pulse: 71 62 (!) 59 63  Resp: 16   17  Temp: 99.4 F (37.4 C) 98 F (36.7 C) 98.5 F (36.9 C) 97.7 F (36.5 C)  TempSrc: Axillary Axillary Axillary Oral  SpO2: 98% 97% 99% 100%  Weight:      Height:        Intake/Output Summary (Last 24 hours) at 10/12/2017 9562 Last data filed at 10/11/2017 1831 Gross per 24 hour  Intake 360 ml  Output -  Net 360 ml   Filed Weights   10/05/17 1746  Weight: 83.7 kg (184 lb 8.4 oz)    Examination:  General exam: Appears older than stated age.  Lying in bed.  Sleepy, hardly wakes up on calling her name. Respiratory system: Bilateral decreased breath sounds at bases Cardiovascular system: Controlled, S1-S2 positive gastrointestinal system: Abdomen is nondistended, soft and nontender. Normal bowel sounds heard. Extremities: No cyanosis, clubbing; trace edema    Data Reviewed: I have personally reviewed following labs and imaging studies  CBC: Recent Labs  Lab 10/05/17 1250 10/09/17 0734 10/12/17 0717  WBC 7.8 9.8 6.6  NEUTROABS 3.9  --  3.2  HGB 12.9 12.1 11.1*  HCT 38.3 36.6 34.5*  MCV 85.9 86.9 87.3  PLT 199 166 143*   Basic Metabolic Panel: Recent Labs  Lab 10/05/17 1250 10/09/17  [id number] 10/12/17 0717  NA 139 137 142  K 3.4* 3.3* 4.2  CL 105 107 115*  CO2 23 21* 19*  GLUCOSE 99 118* 78  BUN 12 16 12   CREATININE 0.75 0.97 0.79  CALCIUM 9.6 8.7* 8.8*  MG  --   --  1.8   GFR: Estimated Creatinine Clearance:  71.4 mL/min (by C-G formula based on SCr of 0.79 mg/dL). Liver Function Tests: Recent Labs  Lab 10/05/17 1250 10/09/17 0734  AST 38 17  ALT 20 13*  ALKPHOS 68 52  BILITOT 0.7 0.5  PROT 7.7 6.6  ALBUMIN 3.8 3.0*   No results for input(s): LIPASE, AMYLASE in the last 168 hours. No results for input(s): AMMONIA in the last 168 hours. Coagulation Profile: Recent Labs  Lab 10/05/17 1250  INR 1.01   Cardiac Enzymes: Recent Labs  Lab 10/05/17 1250  TROPONINI <0.03   BNP (last 3 results) No results for input(s): PROBNP in the last 8760 hours. HbA1C: No results for input(s): HGBA1C in the last 72 hours. CBG: Recent Labs  Lab 10/08/17 2123 10/09/17 0622 10/09/17 1132 10/09/17 1612 10/09/17 2205  GLUCAP 139* 96 110* 103* 103*   Lipid Profile: No results for input(s): CHOL, HDL, LDLCALC, TRIG, CHOLHDL, LDLDIRECT in the last 72 hours. Thyroid Function Tests: No results for input(s): TSH, T4TOTAL, FREET4, T3FREE, THYROIDAB in the last 72 hours. Anemia Panel: No results for input(s): VITAMINB12, FOLATE, FERRITIN, TIBC, IRON, RETICCTPCT in the last 72 hours. Sepsis Labs: Recent Labs  Lab 10/09/17 0734  PROCALCITON <0.10    Recent Results (from the past 240 hour(s))  Culture, blood (routine x 2)     Status: Abnormal (Preliminary result)   Collection Time: 10/09/17  7:25 AM  Result Value Ref Range Status   Specimen Description BLOOD RIGHT HAND  Final   Special Requests AEROCOCCUS SPECIES Blood Culture adequate volume (A)  Final   Culture   Final    NO GROWTH 2 DAYS Performed at [facility], [address] [place], [place] [address]    Report Status PENDING  Incomplete  Culture, blood (routine x 2)     Status: None (Preliminary result)   Collection Time: 10/09/17  7:33 AM  Result Value Ref Range Status   Specimen Description BLOOD LEFT ANTECUBITAL  Final   Special Requests   Final    BOTTLES DRAWN AEROBIC ONLY Blood Culture adequate volume   Culture   Final     NO GROWTH 2 DAYS Performed at [facility], [address] [place], [place] [address]    Report Status PENDING  Incomplete         Radiology Studies: [place] Ekg Site Rite  Result Date: 10/11/2017 If Site Rite image not attached, placement could not be confirmed due to current cardiac rhythm.       Scheduled Meds: . aspirin EC  81 mg Oral Daily  . atorvastatin  10 mg Oral q1800  . cholecalciferol  1,000 Units Oral q morning - 10a  . enoxaparin (LOVENOX) injection  40 mg Subcutaneous Q24H  . ferrous sulfate  325 mg Oral Q breakfast  . levothyroxine  100 mcg Oral Daily  . mirtazapine  15 mg Oral QHS  . phenytoin (DILANTIN) IV  100 mg Intravenous Q8H  . potassium chloride  40 mEq Oral BID  . pregabalin  25 mg Oral Daily  . senna-docusate  1 tablet Oral BID  . sodium chloride flush  10-40 mL Intracatheter Q12H  Continuous Infusions: . lacosamide (VIMPAT) IV Stopped (10/12/17 0024)  . levETIRAcetam Stopped (10/12/17 0603)  . valproate sodium Stopped (10/12/17 1610)     LOS: 7 days        Glade Lloyd, MD [facility] Pager [phone]  If 7PM-7AM, please contact night-coverage [email or web address] Password [facility] 10/12/2017, 9:39 AM

## 2017-10-13 DIAGNOSIS — R2990 Unspecified symptoms and signs involving the nervous system: Secondary | ICD-10-CM

## 2017-10-13 DIAGNOSIS — R569 Unspecified convulsions: Secondary | ICD-10-CM

## 2017-10-13 LAB — CBC WITH DIFFERENTIAL/PLATELET
Basophils Absolute: 0 10*3/uL (ref 0.0–0.1)
Basophils Relative: 1 %
EOS PCT: 7 %
Eosinophils Absolute: 0.4 10*3/uL (ref 0.0–0.7)
HCT: 37.1 % (ref 36.0–46.0)
Hemoglobin: 12.2 g/dL (ref 12.0–15.0)
LYMPHS ABS: 2.5 10*3/uL (ref 0.7–4.0)
LYMPHS PCT: 38 %
MCH: 28.8 pg (ref 26.0–34.0)
MCHC: 32.9 g/dL (ref 30.0–36.0)
MCV: 87.7 fL (ref 78.0–100.0)
MONO ABS: 0.4 10*3/uL (ref 0.1–1.0)
MONOS PCT: 7 %
Neutro Abs: 3.1 10*3/uL (ref 1.7–7.7)
Neutrophils Relative %: 47 %
Platelets: 168 10*3/uL (ref 150–400)
RBC: 4.23 MIL/uL (ref 3.87–5.11)
RDW: 13.9 % (ref 11.5–15.5)
WBC: 6.5 10*3/uL (ref 4.0–10.5)

## 2017-10-13 LAB — COMPREHENSIVE METABOLIC PANEL
ALT: 11 U/L — ABNORMAL LOW (ref 14–54)
AST: 17 U/L (ref 15–41)
Albumin: 2.5 g/dL — ABNORMAL LOW (ref 3.5–5.0)
Alkaline Phosphatase: 44 U/L (ref 38–126)
Anion gap: 9 (ref 5–15)
BUN: 10 mg/dL (ref 6–20)
CALCIUM: 8.7 mg/dL — AB (ref 8.9–10.3)
CHLORIDE: 110 mmol/L (ref 101–111)
CO2: 21 mmol/L — ABNORMAL LOW (ref 22–32)
CREATININE: 0.67 mg/dL (ref 0.44–1.00)
Glucose, Bld: 78 mg/dL (ref 65–99)
Potassium: 4.2 mmol/L (ref 3.5–5.1)
Sodium: 140 mmol/L (ref 135–145)
Total Bilirubin: 0.4 mg/dL (ref 0.3–1.2)
Total Protein: 6.4 g/dL — ABNORMAL LOW (ref 6.5–8.1)

## 2017-10-13 LAB — MAGNESIUM: MAGNESIUM: 1.8 mg/dL (ref 1.7–2.4)

## 2017-10-13 MED ORDER — PHENYTOIN SODIUM 50 MG/ML IJ SOLN
100.0000 mg | Freq: Three times a day (TID) | INTRAMUSCULAR | Status: DC
  Administered 2017-10-13 – 2017-10-15 (×5): 100 mg via INTRAVENOUS
  Filled 2017-10-13 (×5): qty 2

## 2017-10-13 MED ORDER — SODIUM CHLORIDE 0.9 % IV SOLN
1000.0000 mg | Freq: Once | INTRAVENOUS | Status: AC
  Administered 2017-10-13: 1000 mg via INTRAVENOUS
  Filled 2017-10-13: qty 20

## 2017-10-13 NOTE — Progress Notes (Signed)
[name], female   DOB: 01-24-1951, 67 y.o.   MRN: [id number]  PROGRESS NOTE    Kathleen Jensen  WUJ:[id number] DOB: [birth year] DOA: 10/05/2017 PCP: Donato Schultz, DO   Brief Narrative:  67 year old female with history of ICH, seizure, hypertension, hemiparesis, chronic kidney disease stage III, hyperlipidemia, hypothyroidism presented 10/05/2017 with acute aphasia.  Neurology was consulted who opined that she had subclinical seizures.    Assessment & Plan:   Principal Problem:   Stroke-like symptoms Active Problems:   Hypothyroidism   OBESITY, MORBID   OBSTRUCTIVE SLEEP APNEA   Essential hypertension   Chronic kidney disease (CKD), stage III (moderate) (HCC)   Hyperlipidemia  Recurrent subclinical seizures  -Neurology following.   -EEG done on 10/05/2017 revealed: Left posterior quadrant sharp waves and left posterior quadrant slow activity -Continuous EEG in progress -Continue Keppra, Dilantin, Depacon and Vimpat doses as recommended by neurology. -Is more awake today but still confused. -Repeat a.m. labs  Expressive aphasia in the setting of subclinical seizures and old CVA -Neurology following -Code stroke CT head done on 10/05/2017 revealed old stroke and no acute intracranial findings -Fall precautions -Diet as per SLP recommendations  Fever -Probably drug fever from Dilantin.  No signs of infection with negative chest x-ray, no leukocytosis and negative blood cultures.   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  Currently not on antihypertensives  Polyneuropathy -Continue Lyrica  Depression -Continue mirtazapine  Chronic constipation -Continue  Senokot  Obesity BMI 31 Weight loss when more stable outpatient    DVT prophylaxis: Lovenox Code Status: Full Family Communication: None at bedside Disposition Plan: Depends on clinical outcome  Consultants: Neurology  Procedures: EEG/continuous EEG  Antimicrobials: None   Subjective: Patient seen and examined at bedside.  She is awake this morning, tries to answer questions but does not make sense.  No overnight fever or vomiting.  Continues EEG is in process.  Objective: Vitals:   10/12/17 2000 10/13/17 0100 10/13/17 0402 10/13/17 0831  BP: (!) 147/82 (!) 164/95 135/71 129/69  Pulse: 65 60 60 61  Resp:      Temp: 98.7 F (37.1 C) 98.4 F (36.9 C) 98.9 F (37.2 C) 98.3 F (36.8 C)  TempSrc: Oral Oral Axillary Oral  SpO2: 98% 99% 98% 97%  Weight:      Height:        Intake/Output Summary (Last 24 hours) at 10/13/2017 0837 Last data filed at 10/13/2017 0547 Gross per 24 hour  Intake 1176.41 ml  Output -  Net 1176.41 ml   Filed Weights   10/05/17 1746  Weight: 83.7 kg (184 lb 8.4 oz)    Examination:  General exam: Appears older than stated age.  Lying in bed.  More awake today but hardly makes any sense with her answers  respiratory system: Bilateral decreased breath sounds at bases  cardiovascular system: Rate controlled, S1-S2 positive  gastrointestinal system: Abdomen is soft, nontender, normal bowel sounds heard extremities: No cyanosis, clubbing; trace edema    Data Reviewed: I have personally reviewed following labs and imaging studies  CBC: Recent Labs  Lab 10/09/17 0734 10/12/17 0717 10/13/17 0516  WBC 9.8 6.6 6.5  NEUTROABS  --  3.2 3.1  HGB 12.1 11.1* 12.2  HCT 36.6 34.5* 37.1  MCV 86.9 87.3 87.7  PLT 166 143* 168   Basic Metabolic Panel: Recent Labs  Lab 10/09/17 0734 10/12/17 0717 10/13/17 0516  NA 137 142 140  K 3.3* 4.2 4.2  CL 107 115* 110  CO2 21* 19* 21*  GLUCOSE 118* 78 78  BUN 16 12 10   CREATININE 0.97 0.79 0.67   CALCIUM 8.7* 8.8* 8.7*  MG  --  1.8 1.8   GFR: Estimated Creatinine Clearance: 71.4 mL/min (by C-G formula based on SCr of 0.67 mg/dL). Liver Function Tests: Recent Labs  Lab 10/09/17 0734 10/13/17 0516  AST 17 17  ALT 13* 11*  ALKPHOS 52 44  BILITOT 0.5 0.4  PROT 6.6 6.4*  ALBUMIN 3.0* 2.5*   No results for input(s): LIPASE, AMYLASE in the last 168 hours. No results for input(s): AMMONIA in the last 168 hours. Coagulation Profile: No results for input(s): INR, PROTIME in the last 168 hours. Cardiac Enzymes: No results for input(s): CKTOTAL, CKMB, CKMBINDEX, TROPONINI in the last 168 hours. BNP (last 3 results) No results for input(s): PROBNP in the last 8760 hours. HbA1C: No results for input(s): HGBA1C in the last 72 hours. CBG: Recent Labs  Lab 10/08/17 2123 10/09/17 0622 10/09/17 1132 10/09/17 1612 10/09/17 2205  GLUCAP 139* 96 110* 103* 103*   Lipid Profile: No results for input(s): CHOL, HDL, LDLCALC, TRIG, CHOLHDL, LDLDIRECT in the last 72 hours. Thyroid Function Tests: No results for input(s): TSH, T4TOTAL, FREET4, T3FREE, THYROIDAB in the last 72 hours. Anemia Panel: No results for input(s): VITAMINB12, FOLATE, FERRITIN, TIBC, IRON, RETICCTPCT in the last 72 hours. Sepsis Labs: Recent Labs  Lab 10/09/17 0734  PROCALCITON <0.10    Recent Results (from the past 240 hour(s))  Culture, blood (routine x 2)     Status: Abnormal (Preliminary result)   Collection Time: 10/09/17  7:25 AM  Result Value Ref Range Status   Specimen Description BLOOD RIGHT HAND  Final   Special Requests AEROCOCCUS SPECIES Blood Culture adequate volume (A)  Final   Culture   Final    NO GROWTH 3 DAYS Performed at [facility], [address] [place], [place] [address]    Report Status PENDING  Incomplete  Culture, blood (routine x 2)     Status: None (Preliminary result)   Collection Time: 10/09/17  7:33 AM  Result Value Ref Range Status   Specimen Description BLOOD  LEFT ANTECUBITAL  Final   Special Requests   Final    BOTTLES DRAWN AEROBIC ONLY Blood Culture adequate volume   Culture   Final    NO GROWTH 3 DAYS Performed at [facility], [address] [place], [place] [address]    Report Status PENDING  Incomplete         Radiology Studies: [place] Ekg Site Rite  Result Date: 10/11/2017 If Site Rite image not attached, placement could not be confirmed due to current cardiac rhythm.       Scheduled Meds: . aspirin EC  81 mg Oral Daily  . atorvastatin  10 mg Oral q1800  . cholecalciferol  1,000 Units Oral q morning - 10a  . enoxaparin (LOVENOX) injection  40 mg Subcutaneous Q24H  . ferrous sulfate  325 mg Oral Q breakfast  . levothyroxine  100 mcg Oral Daily  . mirtazapine  15 mg Oral QHS  . phenytoin (DILANTIN) IV  100 mg Intravenous Q8H  . potassium chloride  40 mEq Oral BID  . pregabalin  25 mg Oral Daily  . senna-docusate  1 tablet Oral BID  . sodium chloride flush  10-40 mL Intracatheter Q12H   Continuous Infusions: . lacosamide (VIMPAT) IV Stopped (10/12/17 2330)  . levETIRAcetam 2,000 mg (10/13/17 0547)  . valproate sodium 663 mg (10/13/17 0547)     LOS: 8 days        Glade Lloyd, MD [facility] Pager [phone]  If 7PM-7AM, please contact night-coverage [email or web address] Password [facility] 10/13/2017, 8:37 AM

## 2017-10-13 NOTE — Progress Notes (Signed)
LTM continues.  Electrodes reset as needed to bring impedances under 10.  No skin breakdown noted.

## 2017-10-13 NOTE — Progress Notes (Addendum)
Neurology Progress Note   S:// No acute overnight events.  Continues to be a phasic.  O:// Current vital signs: BP 129/69 (BP Location: Left Arm)   Pulse 61   Temp 98.3 F (36.8 C) (Oral)   Resp 17   Ht 5\' 4"  (1.626 m)   Wt 83.7 kg (184 lb 8.4 oz)   SpO2 97%   BMI 31.67 kg/m  Vital signs in last 24 hours: Temp:  [98.3 F (36.8 C)-98.9 F (37.2 C)] 98.3 F (36.8 C) (03/18 0831) Pulse Rate:  [60-73] 61 (03/18 0831) BP: (127-164)/(57-95) 129/69 (03/18 0831) SpO2:  [97 %-99 %] 97 % (03/18 0831) General: Well-developed well-nourished no acute distress HEENT: EEG leads in place, normal cephalic, atraumatic CVS: S1-S2 regular rate rhythm Respiratory: Chest clear to auscultation Abdomen: Nondistended nontender Neurological exam Mental status: Awake, alert, unable to assess orientation. Able to follow some commands and midline like opening her mouth and trying to stick her tongue out.  Unable to show 2 fingers. Able to mimic on left. Word salad in terms of speech. No dysarthria. CN: PERRL, EOMI, blinks b/l, flattened right NLF Motor: 5/5 LUE and LLE. Trace movement on RUE and RLE Sensory: iontact to nox stim Coord: intact on left based on reach for objects, unable to test due to aphasia. No twitching or jerking mov seen   Medications  Current Facility-Administered Medications:  .  acetaminophen (TYLENOL) tablet 650 mg, 650 mg, Oral, Q4H PRN, 650 mg at 10/13/17 0553 **OR** acetaminophen (TYLENOL) solution 650 mg, 650 mg, Per Tube, Q4H PRN, 650 mg at 10/08/17 1952 **OR** acetaminophen (TYLENOL) suppository 650 mg, 650 mg, Rectal, Q4H PRN, Black, Karen M, NP .  aspirin EC tablet 81 mg, 81 mg, Oral, Daily, Dow AdolphHall, Carole N, DO, 81 mg at 10/13/17 0842 .  atorvastatin (LIPITOR) tablet 10 mg, 10 mg, Oral, q1800, Costello, Mary A, NP, 10 mg at 10/12/17 1900 .  cholecalciferol (VITAMIN D) tablet 1,000 Units, 1,000 Units, Oral, q morning - 10a, Darlin DropHall, Carole N, DO, 1,000 Units at 10/13/17  [id number] .  enoxaparin (LOVENOX) injection 40 mg, 40 mg, Subcutaneous, Q24H, Black, Karen M, NP, 40 mg at 10/12/17 2302 .  ferrous sulfate tablet 325 mg, 325 mg, Oral, Q breakfast, Dow AdolphHall, Carole N, DO, 325 mg at 10/13/17 [id number] .  hydrALAZINE (APRESOLINE) injection 5 mg, 5 mg, Intravenous, Q4H PRN, Black, Karen M, NP .  lacosamide (VIMPAT) 200 mg in sodium chloride 0.9 % 25 mL IVPB, 200 mg, Intravenous, Q12H, Caryl PinaLindzen, Eric, MD, Stopped at 10/12/17 2330 .  levETIRAcetam (KEPPRA) 2,000 mg in sodium chloride 0.9 % 100 mL IVPB, 2,000 mg, Intravenous, Q12H, Costello, Lamar BlinksMary A, NP, Stopped at 10/13/17 0602 .  levothyroxine (SYNTHROID, LEVOTHROID) tablet 100 mcg, 100 mcg, Oral, Daily, Dow AdolphHall, Carole N, DO, 100 mcg at 10/13/17 0842 .  mirtazapine (REMERON) tablet 15 mg, 15 mg, Oral, QHS, Hall, Carole N, DO, 15 mg at 10/12/17 2303 .  phenytoin (DILANTIN) injection 100 mg, 100 mg, Intravenous, Q8H, Rejeana BrockKirkpatrick, McNeill P, MD, 100 mg at 10/13/17 0547 .  potassium chloride SA (K-DUR,KLOR-CON) CR tablet 40 mEq, 40 mEq, Oral, BID, Darlin DropHall, Carole N, DO, 40 mEq at 10/13/17 0842 .  pregabalin (LYRICA) capsule 25 mg, 25 mg, Oral, Daily, Hall, Carole N, DO, 25 mg at 10/13/17 0842 .  senna-docusate (Senokot-S) tablet 1 tablet, 1 tablet, Oral, QHS PRN, Gwenyth BenderBlack, Karen M, NP, 1 tablet at 10/09/17 2112 .  senna-docusate (Senokot-S) tablet 1 tablet, 1 tablet, Oral, BID, Hall, [place]ar[place] N, DO,  1 tablet at 10/11/17 0821 .  sodium chloride flush (NS) 0.9 % injection 10-40 mL, 10-40 mL, Intracatheter, Q12H, Alekh, Kshitiz, MD, 10 mL at 10/12/17 2300 .  [COMPLETED] valproate (DEPACON) 1,326 mg in dextrose 5 % 50 mL IVPB, 20 mg/kg (Adjusted), Intravenous, Once, Stopped at 10/09/17 1059 **FOLLOWED BY** valproate (DEPACON) 663 mg in dextrose 5 % 50 mL IVPB, 30 mg/kg/day (Adjusted), Intravenous, Q8H, Caryl Pina, MD, Stopped at 10/13/17 0647 Labs CBC    Component Value Date/Time   WBC 6.5 10/13/2017 0516   RBC 4.23 10/13/2017 0516   HGB 12.2  10/13/2017 0516   HCT 37.1 10/13/2017 0516   PLT 168 10/13/2017 0516   MCV 87.7 10/13/2017 0516   MCH 28.8 10/13/2017 0516   MCHC 32.9 10/13/2017 0516   RDW 13.9 10/13/2017 0516   LYMPHSABS 2.5 10/13/2017 0516   MONOABS 0.4 10/13/2017 0516   EOSABS 0.4 10/13/2017 0516   BASOSABS 0.0 10/13/2017 0516    CMP     Component Value Date/Time   NA 140 10/13/2017 0516   K 4.2 10/13/2017 0516   CL 110 10/13/2017 0516   CO2 21 (L) 10/13/2017 0516   GLUCOSE 78 10/13/2017 0516   GLUCOSE 122 (H) 07/07/2006 1222   BUN 10 10/13/2017 0516   CREATININE 0.67 10/13/2017 0516   CALCIUM 8.7 (L) 10/13/2017 0516   PROT 6.4 (L) 10/13/2017 0516   ALBUMIN 2.5 (L) 10/13/2017 0516   AST 17 10/13/2017 0516   ALT 11 (L) 10/13/2017 0516   ALKPHOS 44 10/13/2017 0516   BILITOT 0.4 10/13/2017 0516   GFRNONAA >60 10/13/2017 0516   GFRAA >60 10/13/2017 0516    glycosylated hemoglobin  Lipid Panel     Component Value Date/Time   CHOL 147 10/06/2017 0646   TRIG 64 10/06/2017 0646   TRIG 93 05/28/2006 1119   HDL 58 10/06/2017 0646   CHOLHDL 2.5 10/06/2017 0646   VLDL 13 10/06/2017 0646   LDLCALC 76 10/06/2017 0646   LTM report by Dr. Dorthea Cove SUMMARY: This was an abnormal continuous video EEGdue to left hemispheric focal slowing with superimposedgeneralized slowing. This record was stable compared to the previous day with no seizures and no apparent epileptiform discharges.  Imaging No new imaging available. CT head, CT angiogram head and neck and perfusion did not show any large vessel occlusion.  Showed mild intracranial and cervical atherosclerosis.  Increased cortical blood flow throughout the posterior left cerebral hemisphere which could have been a result of the seizure activity.  Assessment:  67/F with PMH of IPH presented with RSW and aphasia in Jan to [facility]. Felt to have had a seizure at that time. A very similar presentation on 10/05/2017.  Initial EEG showed significant cortical  irritability but no seizures.  She was loaded with Dilantin in addition to her home Keppra.  Vimpat then added.  EEG continued to show irritability. Fourth agent Depakote added in an attempt to avoid having to intubate the patient and put her on a burst suppression. Long-term EEG report from overnight shows left hemispheric focal slowing with superimposed generalized slowing, which has been stable compared to the prior day. Her clinical exam also has remained unchanged but she currently is a phasic-more of Wernicke's kind-word solid and also some amount of receptive aphasia with ability to only follow simple commands.  Today Depaktoe therapeutic Dilantin subtherapeutic.   Impression: Breakthrough seizure  Recommendations: -C/W Keppra 2000 mg BID -Continue Dilantin 100 mg BID -Continue Vimpat 200 mg BID -Continue Depakote 10mg /kg  TID -Continue LTM for one more day. -Check Depakote and Dilantin levels. -Additional Dilantin 1g IV -Can change to PO if passes swallow. Not sure if burst suppression will help. -Maintain seizure precautions.  -- Milon Dikes, MD [facility] Pager: [phone] If 7pm to 7am, please call on call as listed on AMION.

## 2017-10-13 NOTE — Procedures (Signed)
LTM-EEG Report  HISTORY: Continuous video-EEG monitoring performed for89year old with ICH, aphasia, andseizures. ACQUISITION: International 10-20 system for electrode placement; 18 channels with additional eyes linked to ipsilateral ears and EKG. Additional T1-T2 electrodes were used. Continuous video recording obtained.  EEG NUMBER:[id number] MEDICATIONS: Day4: LEV, LCM, VPA, PHT, PGB Day 5: LEV, LCM, VPA, PHT, PGB Day 6: see EMR  DAY #4: from07303/15/19 to 0730 10/11/17 BACKGROUND: An overall medium voltage continuous recording with good spontaneous variability and reactivity. Waking background consisted of a medium voltage8Hz  posterior dominant rhythmpredominantly over the rightwith low voltage beta activity in thefrontocentral regions(decreased on the left)and frequentmedium voltage theta-deltaactivity diffusely. Slow activity over the left was generally of slower frequencies as compared to the right.Sleep was captured withsome sleep spindles at times. EPILEPTIFORM/PERIODIC ACTIVITY:There were occasional isolated spikes multifocally in the left hemisphere (temporal, frontal, parietal). These showed no ictal evolution and were unchanged in abundance compared to the previous day. SEIZURES: none EVENTS: none  DAY #5: from07303/16/19 to 0730 10/12/17 BACKGROUND: An overall medium voltage continuous recording with good spontaneous variability and reactivity. Waking background consisted of a medium voltage8Hz  posterior dominant rhythmpredominantly over the rightwith low voltage beta activity in thefrontocentral regions(decreased on the left)and frequentmedium voltage theta-deltaactivity diffusely. Slow activity over the left was generally of slower frequencies as compared to the right.Sleep was captured withsome sleep spindles at times. EPILEPTIFORM/PERIODIC ACTIVITY:Epileptiform activity was less prominent compared to the previous day's record.  SEIZURES:  none EVENTS: none  DAY #6: from07303/17/19 to 0730 10/13/17 BACKGROUND: An overall medium voltage continuous recording with good spontaneous variability and reactivity. Waking background consisted of a medium voltage8Hz  posterior dominant rhythmpredominantly over the rightwith low voltage beta activity in thefrontocentral regions(decreased on the left)and frequentmedium voltage theta-deltaactivity diffusely. Slow activity over the left was generally of slower frequencies as compared to the right.Sleep was captured withsome sleep spindles at times. EPILEPTIFORM/PERIODIC ACTIVITY:none  SEIZURES: none EVENTS: none  EKG: no significant arrhythmia  SUMMARY: This was an abnormal continuous video EEGdue to left hemispheric focal slowing with superimposed generalized slowing. This record was stable compared to the previous day with no seizures and no apparent epileptiform discharges.

## 2017-10-13 NOTE — Progress Notes (Signed)
  Speech Language Pathology Treatment: Dysphagia;Cognitive-Linquistic  Patient Details Name: Kathleen ChickRovena Jensen MRN: [id number] DOB: 09/02/1950 Today's Date: 10/13/2017 Time: [id number] SLP Time Calculation (min) (ACUTE ONLY): 27 min  Assessment / Plan / Recommendation Clinical Impression  Pt consumed Dysphagia 3/thin liquids via straw with minimal verbal cues for right labial sweep during/after consumption; pharyngeal phase appears [facility]FL for diet check this date; added right lingual sweep to current swallowing precaution sheet in pt room; pt continues with persistent moderate expressive/receptive aphasia with moderate verbal/visual cueing required for answering yes/no questions with perseveration noted throughout treatment with restating name when asked various personal information questions secondary to attention deficits; simple yes/no responses with 40% accuracy; able to name family members on memory board with 60% accuracy given moderate visual/verbal cues; ST will continue to f/u while in acute setting for aphasia tx/diet tolerance; recommend continue with Dysphagia 3/thin liquids with added right lingual sweep during consumption.   HPI HPI: 67 y.o. 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 She is evaluated by neurology who opined subclinical seizures.      SLP Plan  Continue with current plan of care       Recommendations  Diet recommendations: Dysphagia 3 (mechanical soft);Thin liquid Liquids provided via: Cup;Straw Medication Administration: Other (Comment)(whole meds with water; one at a time) Supervision: Staff to assist with self feeding;Intermittent supervision to cue for compensatory strategies Compensations: Minimize environmental distractions;Slow rate;Small  sips/bites;Lingual sweep for clearance of pocketing Postural Changes and/or Swallow Maneuvers: Seated upright 90 degrees                Oral Care Recommendations: Oral care BID Follow up Recommendations: Other (comment)(TBD) SLP Visit Diagnosis: Dysphagia, oral phase (R13.11);Aphasia (R47.01) Plan: Continue with current plan of care                      Tressie StalkerPat Adams, M.S., CCC-SLP 10/13/2017, 2:35 PM

## 2017-10-14 ENCOUNTER — Inpatient Hospital Stay (HOSPITAL_COMMUNITY): Payer: Medicare HMO

## 2017-10-14 LAB — MAGNESIUM: MAGNESIUM: 2 mg/dL (ref 1.7–2.4)

## 2017-10-14 LAB — BASIC METABOLIC PANEL
Anion gap: 8 (ref 5–15)
BUN: 6 mg/dL (ref 6–20)
CHLORIDE: 108 mmol/L (ref 101–111)
CO2: 24 mmol/L (ref 22–32)
CREATININE: 0.66 mg/dL (ref 0.44–1.00)
Calcium: 8.7 mg/dL — ABNORMAL LOW (ref 8.9–10.3)
GFR calc non Af Amer: >60 mL/min (ref >60)
Glucose, Bld: 72 mg/dL (ref 65–99)
Potassium: 4.4 mmol/L (ref 3.5–5.1)
SODIUM: 140 mmol/L (ref 135–145)

## 2017-10-14 LAB — CULTURE, BLOOD (ROUTINE X 2)
CULTURE: NO GROWTH
Culture: NO GROWTH
Special Requests: ADEQUATE — AB
Special Requests: ADEQUATE

## 2017-10-14 MED ORDER — LORAZEPAM 2 MG/ML IJ SOLN: 1.0000 mg | Freq: Once | INTRAMUSCULAR | Status: DC

## 2017-10-14 NOTE — Progress Notes (Signed)
LTM discontinued per Dr Wilford CornerArora. No skin breakdown was seen.

## 2017-10-14 NOTE — Progress Notes (Signed)
OT Cancellation    10/14/17 1600  OT Visit Information  Last OT Received On 10/14/17  Reason Eval/Treat Not Completed Patient at procedure or test/ unavailable. Pt on con't EEG. Due to patient with severe cognitive deficits and requiring max/total Ax2, pt unsafe to mobilize while undergoing con't EEG. Will return as medically ready. Thank you.   Timothey Dahlstrom MSOT, OTR/L Acute Rehab Pager: [phone] Office: [phone]

## 2017-10-14 NOTE — Progress Notes (Signed)
[name], female   DOB: 1950/12/22, 67 y.o.   MRN: [id number]  PROGRESS NOTE    Kathleen Jensen  WUJ:[id number] DOB: April 10, 1951 DOA: 10/05/2017 PCP: Donato Schultz, DO   Brief Narrative:  67 year old female with history of ICH, seizure, hypertension, hemiparesis, chronic kidney disease stage III, hyperlipidemia, hypothyroidism presented 10/05/2017 with acute aphasia.  Neurology was consulted who opined that she had subclinical seizures.    Assessment & Plan:   Principal Problem:   Stroke-like symptoms Active Problems:   Hypothyroidism   OBESITY, MORBID   OBSTRUCTIVE SLEEP APNEA   Essential hypertension   Chronic kidney disease (CKD), stage III (moderate) (HCC)   Hyperlipidemia  Recurrent subclinical seizures  -Neurology following.   -EEG done on 10/05/2017 revealed: Left posterior quadrant sharp waves and left posterior quadrant slow activity -Continuous EEG in progress -Currently on various antiepileptics as per neurology.  Spoke to Dr. Arora/neurology on phone today who stated that he will discontinue Depakote and continue Keppra, Vimpat and Dilantin.  He wants to get an MRI of the brain and possible PET scan.  Patient had an MRI of the brain in January 2019 at [facility] so patient should not have any problem undergoing repeat MRI since the patient has a pacemaker -Is awake today but still confused and dysarthric -Repeat a.m. labs  Expressive aphasia in the setting of subclinical seizures and old CVA -Neurology following -Code stroke CT head done on 10/05/2017 revealed old stroke and no acute intracranial findings -Fall precautions -Diet as per SLP recommendations  Fever -Probably drug fever from Dilantin.  No signs of infection with negative chest x-ray, no leukocytosis and negative blood cultures.   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  Currently not on antihypertensives.  Blood pressure on the higher side, will need to start antihypertensives if blood pressure is persistently elevated.  Polyneuropathy -Continue Lyrica  Depression -Continue mirtazapine  Chronic constipation -Continue Senokot  Obesity BMI 31 Weight loss when more stable outpatient    DVT prophylaxis: Lovenox Code Status: Full Family Communication: None at bedside Disposition Plan: Depends on clinical outcome  Consultants: Neurology  Procedures: EEG/continuous EEG  Antimicrobials: None   Subjective: Patient seen and examined at bedside.  She is awake this morning, tries to answer questions but still does not make sense.  No overnight fever or vomiting.  Continues EEG is in process.  Objective: Vitals:   10/13/17 2001 10/14/17 0123 10/14/17 0500 10/14/17 0852  BP: 139/73 (!) 158/76 (!) 151/100 (!) 149/87  Pulse: (!) 59 (!) 59 61 61  Resp:   18 16  Temp: 98.5 F (36.9 C) 99 F (37.2 C) 98.7 F (37.1 C) 98.6 F (37 C)  TempSrc: Oral Oral Axillary Oral  SpO2: 98% 100% 98% 100%  Weight:      Height:        Intake/Output Summary (Last 24 hours) at 10/14/2017 1017 Last data filed at 10/14/2017 0300 Gross per 24 hour  Intake 323.26 ml  Output -  Net 323.26 ml   Filed Weights   10/05/17 1746  Weight: 83.7 kg (184 lb 8.4 oz)    Examination:  General exam: Appears older than stated age.  Lying in bed.  Still dysarthric.  Awake Respiratory system: Bilateral decreased breath sounds at bases  cardiovascular system: Rate controlled, S1-S2 positive  gastrointestinal system: Abdomen is obese, soft, nontender, normal bowel sounds heard extremities: No cyanosis, clubbing; trace edema    Data Reviewed: I have personally reviewed following labs and  imaging studies  CBC: Recent Labs  Lab 10/09/17 0734 10/12/17 0717 10/13/17 0516  WBC 9.8 6.6 6.5  NEUTROABS  --  3.2 3.1  HGB 12.1 11.1* 12.2  HCT 36.6 34.5* 37.1  MCV 86.9 87.3 87.7  PLT 166 143* 168   Basic Metabolic Panel: Recent Labs  Lab 10/09/17 0734 10/12/17 0717 10/13/17 0516 10/14/17 0500  NA 137 142 140 140  K 3.3* 4.2 4.2 4.4  CL 107 115* 110 108  CO2 21* 19* 21* 24  GLUCOSE 118* 78 78 72  BUN 16 12 10 6   CREATININE 0.97 0.79 0.67 0.66  CALCIUM 8.7* 8.8* 8.7* 8.7*  MG  --  1.8 1.8 2.0   GFR: Estimated Creatinine Clearance: 71.4 mL/min (by C-G formula based on SCr of 0.66 mg/dL). Liver Function Tests: Recent Labs  Lab 10/09/17 0734 10/13/17 0516  AST 17 17  ALT 13* 11*  ALKPHOS 52 44  BILITOT 0.5 0.4  PROT 6.6 6.4*  ALBUMIN 3.0* 2.5*   No results for input(s): LIPASE, AMYLASE in the last 168 hours. No results for input(s): AMMONIA in the last 168 hours. Coagulation Profile: No results for input(s): INR, PROTIME in the last 168 hours. Cardiac Enzymes: No results for input(s): CKTOTAL, CKMB, CKMBINDEX, TROPONINI in the last 168 hours. BNP (last 3 results) No results for input(s): PROBNP in the last 8760 hours. HbA1C: No results for input(s): HGBA1C in the last 72 hours. CBG: Recent Labs  Lab 10/08/17 2123 10/09/17 0622 10/09/17 1132 10/09/17 1612 10/09/17 2205  GLUCAP 139* 96 110* 103* 103*   Lipid Profile: No results for input(s): CHOL, HDL, LDLCALC, TRIG, CHOLHDL, LDLDIRECT in the last 72 hours. Thyroid Function Tests: No results for input(s): TSH, T4TOTAL, FREET4, T3FREE, THYROIDAB in the last 72 hours. Anemia Panel: No results for input(s): VITAMINB12, FOLATE, FERRITIN, TIBC, IRON, RETICCTPCT in the last 72 hours. Sepsis Labs: Recent Labs  Lab 10/09/17 0734  PROCALCITON <0.10    Recent Results (from the past 240 hour(s))  Culture, blood (routine x 2)     Status: Abnormal   Collection Time: 10/09/17  7:25 AM  Result Value  Ref Range Status   Specimen Description BLOOD RIGHT HAND  Final   Special Requests AEROCOCCUS SPECIES Blood Culture adequate volume (A)  Final   Culture   Final    NO GROWTH 5 DAYS Performed at [facility], [address] [place], [place] [address]    Report Status 10/14/2017 FINAL  Final  Culture, blood (routine x 2)     Status: None   Collection Time: 10/09/17  7:33 AM  Result Value Ref Range Status   Specimen Description BLOOD LEFT ANTECUBITAL  Final   Special Requests   Final    BOTTLES DRAWN AEROBIC ONLY Blood Culture adequate volume   Culture   Final    NO GROWTH 5 DAYS Performed at [facility], [address] [place], [place] [address]    Report Status 10/14/2017 FINAL  Final         Radiology Studies: No results found.      Scheduled Meds: . aspirin EC  81 mg Oral Daily  . atorvastatin  10 mg Oral q1800  . cholecalciferol  1,000 Units Oral q morning - 10a  .  enoxaparin (LOVENOX) injection  40 mg Subcutaneous Q24H  . ferrous sulfate  325 mg Oral Q breakfast  . levothyroxine  100 mcg Oral Daily  . mirtazapine  15 mg Oral QHS  . phenytoin (DILANTIN) IV  100 mg Intravenous Q8H  . potassium chloride  40 mEq Oral BID  . pregabalin  25 mg Oral Daily  . senna-docusate  1 tablet Oral BID  . sodium chloride flush  10-40 mL Intracatheter Q12H   Continuous Infusions: . lacosamide (VIMPAT) IV Stopped (10/14/17 0002)  . levETIRAcetam Stopped (10/14/17 0538)  . valproate sodium Stopped (10/14/17 0835)     LOS: 9 days        Glade Lloyd, MD [facility] Pager [phone]  If 7PM-7AM, please contact night-coverage [email or web address] Password [facility] 10/14/2017, 10:17 AM

## 2017-10-14 NOTE — Procedures (Signed)
LTM-EEG Report  HISTORY: Continuous video-EEG monitoring performed for[age]year old with ICH, aphasia, andseizures. ACQUISITION: International 10-20 system for electrode placement; 18 channels with additional eyes linked to ipsilateral ears and EKG. Additional T1-T2 electrodes were used. Continuous video recording obtained.  EEG NUMBER:[id number] MEDICATIONS: Day4: LEV, LCM, VPA, PHT, PGB Day 5: LEV, LCM, VPA, PHT, PGB Day 6: see EMR Day 7: see EMR  DAY #4: from07303/15/19 to 0730 10/11/17 BACKGROUND: An overall medium voltage continuous recording with good spontaneous variability and reactivity. Waking background consisted of a medium voltage8Hz  posterior dominant rhythmpredominantly over the rightwith low voltage beta activity in thefrontocentral regions(decreased on the left)and frequentmedium voltage theta-deltaactivity diffusely. Slow activity over the left was generally of slower frequencies as compared to the right.Sleep was captured withsome sleep spindles at times. EPILEPTIFORM/PERIODIC ACTIVITY:There were occasional isolated spikes multifocally in the left hemisphere (temporal, frontal, parietal). These showed no ictal evolution and were unchanged in abundance compared to the previous day. SEIZURES: none EVENTS: none  DAY #5: from07303/16/19 to 0730 10/12/17 BACKGROUND: An overall medium voltage continuous recording with good spontaneous variability and reactivity. Waking background consisted of a medium voltage8Hz  posterior dominant rhythmpredominantly over the rightwith low voltage beta activity in thefrontocentral regions(decreased on the left)and frequentmedium voltage theta-deltaactivity diffusely. Slow activity over the left was generally of slower frequencies as compared to the right.Sleep was captured withsome sleep spindles at times. EPILEPTIFORM/PERIODIC ACTIVITY:Epileptiform activity was less prominent compared to the previous day's  record. SEIZURES: none EVENTS: none  DAY #6: from07303/17/19 to 0730 10/13/17 BACKGROUND: An overall medium voltage continuous recording with good spontaneous variability and reactivity. Waking background consisted of a medium voltage8Hz  posterior dominant rhythmpredominantly over the rightwith low voltage beta activity in thefrontocentral regions(decreased on the left)and frequentmedium voltage theta-deltaactivity diffusely. Slow activity over the left was generally of slower frequencies as compared to the right.Sleep was captured withsome sleep spindles at times. EPILEPTIFORM/PERIODIC ACTIVITY:none SEIZURES: none EVENTS: none  DAY #7: from07303/18/19 to 0730 10/14/17 BACKGROUND: An overall medium voltage continuous recording with good spontaneous variability and reactivity. Waking background consisted of a medium voltage8Hz  posterior dominant rhythmpredominantly over the rightwith low voltage beta activity in thefrontocentral regions(decreased on the left)and frequentmedium voltage theta-deltaactivity diffusely. Slow activity over the left was generally of slower frequencies as compared to the right.Sleep was captured withsome sleep spindles at times.The background was unchanged compared to the previous day's record. EPILEPTIFORM/PERIODIC ACTIVITY:none SEIZURES: none EVENTS: none  EKG: no significant arrhythmia  SUMMARY: This was an abnormal continuous video EEGdue to left hemispheric focal slowing with superimposedgeneralized slowing. This record was stable compared to the previous day with no seizures and no apparent epileptiform discharges.

## 2017-10-14 NOTE — Progress Notes (Addendum)
Neurology Progress Note   S:// No acute changes LTM shows left sided slowing with gen slowing. No sz. Discussed with Dr. Dorthea Cove  About possibility for further imaging. MRI is a reasonable start and might provide evidence of ongoing seizure activity if there are diffusion changes. PET might be considered after that too. Will be logistically challenging due to scanner not being on site but ive reached out to [facility] for possibly arranging that. Waiting to hear back.   O:// Current vital signs: BP (!) 149/87 (BP Location: Left Arm)   Pulse 61   Temp 98.6 F (37 C) (Oral)   Resp 16   Ht 5\' 4"  (1.626 m)   Wt 83.7 kg (184 lb 8.4 oz)   SpO2 100%   BMI 31.67 kg/m  Vital signs in last 24 hours: Temp:  [98.3 F (36.8 C)-99 F (37.2 C)] 98.6 F (37 C) (03/19 0852) Pulse Rate:  [59-64] 61 (03/19 0852) Resp:  [16-20] 16 (03/19 0852) BP: (127-158)/(69-100) 149/87 (03/19 0852) SpO2:  [98 %-100 %] 100 % (03/19 0852) Gen: WD WN NAD HEENT: [place] AT MMM CVS: S1S2+, rrr Abd: ND NT Neurological exam Awake, alert, unable to assess orientation due to aphasia. Is able to say her name.  Upon asking the name other objects continues to repeat her name.  Is able to speak some sentences which make absolutely no sense. Cranial nerves: Pupils equal round reactive, extraocular movement intact, blinks bilaterally to threat, flattened right nasolabial fold. Motor exam: Left upper and lower extremity 5/5.  Trace movement on the right upper extremity and right lower extremity. Sensory: Intact to noxious edematous Coordination: Intact on left based on the reach for objects but unable to test due to her inability to follow commands.  No involuntary movements twitching or jerking noticed.  Medications  Current Facility-Administered Medications:  .  acetaminophen (TYLENOL) tablet 650 mg, 650 mg, Oral, Q4H PRN, 650 mg at 10/13/17 0553 **OR** acetaminophen (TYLENOL) solution 650 mg, 650 mg, Per Tube, Q4H PRN, 650  mg at 10/08/17 1952 **OR** acetaminophen (TYLENOL) suppository 650 mg, 650 mg, Rectal, Q4H PRN, Black, Karen M, NP .  aspirin EC tablet 81 mg, 81 mg, Oral, Daily, Dow Adolph N, DO, 81 mg at 10/13/17 0842 .  atorvastatin (LIPITOR) tablet 10 mg, 10 mg, Oral, q1800, Costello, Mary A, NP, 10 mg at 10/13/17 1809 .  cholecalciferol (VITAMIN D) tablet 1,000 Units, 1,000 Units, Oral, q morning - 10a, Darlin Drop, DO, 1,000 Units at 10/13/17 1610 .  enoxaparin (LOVENOX) injection 40 mg, 40 mg, Subcutaneous, Q24H, Black, Karen M, NP, 40 mg at 10/13/17 2205 .  ferrous sulfate tablet 325 mg, 325 mg, Oral, Q breakfast, Dow Adolph N, DO, 325 mg at 10/13/17 9604 .  hydrALAZINE (APRESOLINE) injection 5 mg, 5 mg, Intravenous, Q4H PRN, Black, Karen M, NP .  lacosamide (VIMPAT) 200 mg in sodium chloride 0.9 % 25 mL IVPB, 200 mg, Intravenous, Q12H, Caryl Pina, MD, Stopped at 10/14/17 0002 .  levETIRAcetam (KEPPRA) 2,000 mg in sodium chloride 0.9 % 100 mL IVPB, 2,000 mg, Intravenous, Q12H, Costello, Lamar Blinks, NP, Stopped at 10/14/17 [phone] .  levothyroxine (SYNTHROID, LEVOTHROID) tablet 100 mcg, 100 mcg, Oral, Daily, Dow Adolph N, DO, 100 mcg at 10/13/17 0842 .  mirtazapine (REMERON) tablet 15 mg, 15 mg, Oral, QHS, Hall, Carole N, DO, 15 mg at 10/13/17 2206 .  phenytoin (DILANTIN) injection 100 mg, 100 mg, Intravenous, Q8H, Alekh, Kshitiz, MD, 100 mg at 10/14/17 0640 .  potassium  chloride SA (K-DUR,KLOR-CON) CR tablet 40 mEq, 40 mEq, Oral, BID, Darlin DropHall, Carole N, DO, 40 mEq at 10/13/17 2205 .  pregabalin (LYRICA) capsule 25 mg, 25 mg, Oral, Daily, Hall, Carole N, DO, 25 mg at 10/13/17 0842 .  senna-docusate (Senokot-S) tablet 1 tablet, 1 tablet, Oral, QHS PRN, Gwenyth BenderBlack, Karen M, NP, 1 tablet at 10/09/17 2112 .  senna-docusate (Senokot-S) tablet 1 tablet, 1 tablet, Oral, BID, Darlin DropHall, Carole N, DO, 1 tablet at 10/13/17 2206 .  sodium chloride flush (NS) 0.9 % injection 10-40 mL, 10-40 mL, Intracatheter, Q12H, Alekh,  Kshitiz, MD, 10 mL at 10/13/17 2214 .  [COMPLETED] valproate (DEPACON) 1,326 mg in dextrose 5 % 50 mL IVPB, 20 mg/kg (Adjusted), Intravenous, Once, Stopped at 10/09/17 1059 **FOLLOWED BY** valproate (DEPACON) 663 mg in dextrose 5 % 50 mL IVPB, 30 mg/kg/day (Adjusted), Intravenous, Q8H, Caryl PinaLindzen, Eric, MD, Last Rate: 56.6 mL/hr at 10/14/17 0640, 663 mg at 10/14/17 0640 Labs CBC    Component Value Date/Time   WBC 6.5 10/13/2017 0516   RBC 4.23 10/13/2017 0516   HGB 12.2 10/13/2017 0516   HCT 37.1 10/13/2017 0516   PLT 168 10/13/2017 0516   MCV 87.7 10/13/2017 0516   MCH 28.8 10/13/2017 0516   MCHC 32.9 10/13/2017 0516   RDW 13.9 10/13/2017 0516   LYMPHSABS 2.5 10/13/2017 0516   MONOABS 0.4 10/13/2017 0516   EOSABS 0.4 10/13/2017 0516   BASOSABS 0.0 10/13/2017 0516   CMP     Component Value Date/Time   NA 140 10/14/2017 0500   K 4.4 10/14/2017 0500   CL 108 10/14/2017 0500   CO2 24 10/14/2017 0500   GLUCOSE 72 10/14/2017 0500   GLUCOSE 122 (H) 07/07/2006 1222   BUN 6 10/14/2017 0500   CREATININE 0.66 10/14/2017 0500   CALCIUM 8.7 (L) 10/14/2017 0500   PROT 6.4 (L) 10/13/2017 0516   ALBUMIN 2.5 (L) 10/13/2017 0516   AST 17 10/13/2017 0516   ALT 11 (L) 10/13/2017 0516   ALKPHOS 44 10/13/2017 0516   BILITOT 0.4 10/13/2017 0516   GFRNONAA >60 10/14/2017 0500   GFRAA >60 10/14/2017 0500   LTM EEG Day 7 SUMMARY: This was an abnormal continuous video EEGdue to left hemispheric focal slowing with superimposedgeneralized slowing. This record wasstable compared to the previous day with no seizures and no apparent epileptiform discharges.  Imaging I have reviewed images in epic and the results pertinent to this consultation are: CT-scan of the brain - left fronto parietal encephalomalacia  Assessment:  67 year old with a past history of a left-sided ICH presented with right-sided weakness and aphasia at [facility] was initially felt that  she is having a stroke but eventually deemed that it was a seizure that responded well to Dilantin. She had a similar presentation when [facility].  Initial EEG showed significant cortical irritability but no seizures.  She was loaded with Dilantin in addition to her home dose of Keppra.  Vimpat was then added.  EEG continued to show irritability and an attempt to not intubate her and sedate her heavily, a fourth agent Depakote was added. Long-term EEG has been ongoing for many days now and it continues to show left hemispheric focal slowing with superimposed generalized slowing that has been compared to previous days but she continues to be a phasic. At baseline, family says that she has been able to since the ICH, be talking in complete sentences. I have unable  to talk to the family myself but I am going by the reports from the chart. I attempted to obtain a PET scan and spoke with the specialist over at [facility] the discussion and result was that there might not be a great role for PET scan as far as evaluating whether there is a underlying active focus of irritability. An MRI might be a better choice.  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 Will d/c Depakote as it interacts with Dilantin. Will keep on Keppra, Vimpat and Dilantin for now.  -- Milon Dikes, MD [facility] Pager: [phone] If 7pm to 7am, please call on call as listed on AMION.   ADDENDUM MRI still trying to see if pacer is MRI safe. Will update once more info obtained. D/C LTM as she has not demonstrated sz for many days.

## 2017-10-14 NOTE — Progress Notes (Signed)
PT Cancellation Note  Patient Details Name: Kathleen Jensen MRN: [id number] DOB: 1951/03/17   Cancelled Treatment:    Reason Eval/Treat Not Completed: Patient at procedure or test/unavailable. Pt on con't EEG. Due to patient with severe cognitive deficits and requiring max/total Ax2, pt unsafe to mobilize while undergoing con't EEG. Acute PT to return as able once pt off cont EEG.   Lewis ShockAshly Kaiesha Tonner, PT, DPT Pager #: [phone] Office #: [phone]    Kathleen Jensen 10/14/2017, 10:08 AM

## 2017-10-14 NOTE — Progress Notes (Signed)
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  However it was noted that patient had been scanned at another facility with it in place.  Bear in mind this is a research facility and they operate under different protocols and these scans are operated by Neuro fellows who monitor everything during scanning with special protocols.  We are unable to scan this patient at this facility.

## 2017-10-15 ENCOUNTER — Inpatient Hospital Stay (HOSPITAL_COMMUNITY): Payer: Medicare HMO

## 2017-10-15 DIAGNOSIS — I69951 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: Secondary | ICD-10-CM | POA: Diagnosis not present

## 2017-10-15 DIAGNOSIS — R1310 Dysphagia, unspecified: Secondary | ICD-10-CM

## 2017-10-15 DIAGNOSIS — G4089 Other seizures: Secondary | ICD-10-CM | POA: Diagnosis not present

## 2017-10-15 DIAGNOSIS — I680 Cerebral amyloid angiopathy: Secondary | ICD-10-CM | POA: Diagnosis not present

## 2017-10-15 DIAGNOSIS — G40909 Epilepsy, unspecified, not intractable, without status epilepticus: Secondary | ICD-10-CM

## 2017-10-15 DIAGNOSIS — R2681 Unsteadiness on feet: Secondary | ICD-10-CM | POA: Diagnosis not present

## 2017-10-15 DIAGNOSIS — E539 Vitamin B deficiency, unspecified: Secondary | ICD-10-CM | POA: Diagnosis not present

## 2017-10-15 DIAGNOSIS — R279 Unspecified lack of coordination: Secondary | ICD-10-CM | POA: Diagnosis not present

## 2017-10-15 DIAGNOSIS — M79605 Pain in left leg: Secondary | ICD-10-CM | POA: Diagnosis not present

## 2017-10-15 DIAGNOSIS — I69320 Aphasia following cerebral infarction: Secondary | ICD-10-CM | POA: Diagnosis not present

## 2017-10-15 DIAGNOSIS — I69298 Other sequelae of other nontraumatic intracranial hemorrhage: Secondary | ICD-10-CM | POA: Diagnosis not present

## 2017-10-15 DIAGNOSIS — I10 Essential (primary) hypertension: Secondary | ICD-10-CM | POA: Diagnosis not present

## 2017-10-15 DIAGNOSIS — D519 Vitamin B12 deficiency anemia, unspecified: Secondary | ICD-10-CM | POA: Diagnosis not present

## 2017-10-15 DIAGNOSIS — I69251 Hemiplegia and hemiparesis following other nontraumatic intracranial hemorrhage affecting right dominant side: Secondary | ICD-10-CM | POA: Diagnosis not present

## 2017-10-15 DIAGNOSIS — R300 Dysuria: Secondary | ICD-10-CM | POA: Diagnosis not present

## 2017-10-15 DIAGNOSIS — R41841 Cognitive communication deficit: Secondary | ICD-10-CM | POA: Diagnosis not present

## 2017-10-15 DIAGNOSIS — Z743 Need for continuous supervision: Secondary | ICD-10-CM | POA: Diagnosis not present

## 2017-10-15 DIAGNOSIS — R4701 Aphasia: Secondary | ICD-10-CM

## 2017-10-15 DIAGNOSIS — D649 Anemia, unspecified: Secondary | ICD-10-CM | POA: Diagnosis not present

## 2017-10-15 DIAGNOSIS — E538 Deficiency of other specified B group vitamins: Secondary | ICD-10-CM | POA: Diagnosis not present

## 2017-10-15 DIAGNOSIS — N390 Urinary tract infection, site not specified: Secondary | ICD-10-CM | POA: Diagnosis not present

## 2017-10-15 DIAGNOSIS — R2689 Other abnormalities of gait and mobility: Secondary | ICD-10-CM | POA: Diagnosis not present

## 2017-10-15 DIAGNOSIS — I69398 Other sequelae of cerebral infarction: Secondary | ICD-10-CM | POA: Diagnosis not present

## 2017-10-15 DIAGNOSIS — R569 Unspecified convulsions: Secondary | ICD-10-CM | POA: Diagnosis not present

## 2017-10-15 DIAGNOSIS — E039 Hypothyroidism, unspecified: Secondary | ICD-10-CM | POA: Diagnosis not present

## 2017-10-15 DIAGNOSIS — M6281 Muscle weakness (generalized): Secondary | ICD-10-CM | POA: Diagnosis not present

## 2017-10-15 DIAGNOSIS — I69351 Hemiplegia and hemiparesis following cerebral infarction affecting right dominant side: Secondary | ICD-10-CM | POA: Diagnosis not present

## 2017-10-15 DIAGNOSIS — M79604 Pain in right leg: Secondary | ICD-10-CM | POA: Diagnosis not present

## 2017-10-15 LAB — CBC WITH DIFFERENTIAL/PLATELET
BASOS PCT: 1 %
Basophils Absolute: 0 10*3/uL (ref 0.0–0.1)
EOS ABS: 0.3 10*3/uL (ref 0.0–0.7)
Eosinophils Relative: 3 %
HCT: 34.5 % — ABNORMAL LOW (ref 36.0–46.0)
HEMOGLOBIN: 11.2 g/dL — AB (ref 12.0–15.0)
LYMPHS ABS: 3.1 10*3/uL (ref 0.7–4.0)
Lymphocytes Relative: 37 %
MCH: 28.3 pg (ref 26.0–34.0)
MCHC: 32.5 g/dL (ref 30.0–36.0)
MCV: 87.1 fL (ref 78.0–100.0)
MONO ABS: 0.7 10*3/uL (ref 0.1–1.0)
MONOS PCT: 9 %
Neutro Abs: 4.3 10*3/uL (ref 1.7–7.7)
Neutrophils Relative %: 50 %
Platelets: 208 10*3/uL (ref 150–400)
RBC: 3.96 MIL/uL (ref 3.87–5.11)
RDW: 13.5 % (ref 11.5–15.5)
WBC: 8.5 10*3/uL (ref 4.0–10.5)

## 2017-10-15 LAB — MAGNESIUM: Magnesium: 1.8 mg/dL (ref 1.7–2.4)

## 2017-10-15 LAB — COMPREHENSIVE METABOLIC PANEL
ALBUMIN: 2.6 g/dL — AB (ref 3.5–5.0)
ALK PHOS: 45 U/L (ref 38–126)
ALT: 11 U/L — ABNORMAL LOW (ref 14–54)
ANION GAP: 7 (ref 5–15)
AST: 17 U/L (ref 15–41)
BILIRUBIN TOTAL: 0.4 mg/dL (ref 0.3–1.2)
BUN: 6 mg/dL (ref 6–20)
CALCIUM: 8.8 mg/dL — AB (ref 8.9–10.3)
CO2: 24 mmol/L (ref 22–32)
Chloride: 109 mmol/L (ref 101–111)
Creatinine, Ser: 0.66 mg/dL (ref 0.44–1.00)
GFR calc non Af Amer: >60 mL/min (ref >60)
GLUCOSE: 75 mg/dL (ref 65–99)
POTASSIUM: 4.4 mmol/L (ref 3.5–5.1)
SODIUM: 140 mmol/L (ref 135–145)
TOTAL PROTEIN: 6.6 g/dL (ref 6.5–8.1)

## 2017-10-15 MED ORDER — LACOSAMIDE 200 MG PO TABS: 200.0000 mg | ORAL_TABLET | Freq: Two times a day (BID) | ORAL | Status: DC

## 2017-10-15 MED ORDER — LEVETIRACETAM 500 MG PO TABS: 2000.0000 mg | ORAL_TABLET | Freq: Two times a day (BID) | ORAL | Status: DC

## 2017-10-15 MED ORDER — LEVETIRACETAM 750 MG PO TABS: 2000.0000 mg | ORAL_TABLET | Freq: Two times a day (BID) | ORAL | Status: DC

## 2017-10-15 MED ORDER — PHENYTOIN 50 MG PO CHEW
100.0000 mg | CHEWABLE_TABLET | Freq: Three times a day (TID) | ORAL | Status: DC
  Filled 2017-10-15: qty 2

## 2017-10-15 MED ORDER — PHENYTOIN 50 MG PO CHEW: 100.0000 mg | CHEWABLE_TABLET | Freq: Three times a day (TID) | ORAL | Status: DC

## 2017-10-15 NOTE — Discharge Summary (Addendum)
Physician Discharge Summary  Kathleen Jensen ZOX:[id number] DOB: Jan 08, 1951 DOA: 10/05/2017  PCP: Donato Schultz, DO  Admit date: 10/05/2017 Discharge date: 10/15/2017  Transfer summary.  Per discussion with neurology, patient is being transferred to [facility] to the neurology service Dr. Norma Fredrickson.  Discharge Diagnoses:  1. 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  2. Dysphagia. 3. Essential hypertension. 4. Hypothyroidism 5. Fever unknown clear etiology, possibly drug fever from Dilantin.  Resolved.  Discharge Condition: stable Disposition: transfer to [facility]  Diet recommendation: Dysphagia 3, thin liquid  Filed Weights   10/05/17 1746  Weight: 83.7 kg (184 lb 8.4 oz)    History of present illness:  67 year old woman PMH stroke November 2018 resulting in aphasia, right-sided weakness, reported full recovery from speech disturbance.  By history, some kind of bleeding associated with the stroke.  Not on any outpatient antiplatelet agents.  Presented 3/10 with slurred speech and aphasia.  She was seen by tele-neurology and transferred to [facility] for further evaluation.    Hospital Course:   Expressive aphasia thought secondary to subclinical seizures.  CT angios, perfusion of the head without acute abnormality.  Not a TPA candidate per neurology.  Subclinical seizures were considered.  Loaded with Keppra, Dilantin.  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  No seizure was recorded.  Aphasia persisted, Vimpat ordered.  Symptoms persisted and long-term monitoring EEG ordered 3/12. Video EEG showed structural abnormality, epileptogenic right parietal central region, one electrographic seizure, moderate diffuse encephalopathy.  This gradually improved with resolution of seizures but no clinical improvement was observed.  Despite antiepileptics, condition is not improved. --Continue management per  neurology including antiepileptic drugs (Vimpat, Keppra, Dilantin). I discussed with Dr. Wilford Corner, he recommended transfer to [facility] for MRI as they have a special MRI protocol for pacemakers.  Also PET scan would be considered.   --discussed with Dr. Wilford Corner, he repeated CT of head which was unchanged.  He discussed further with neurology at [facility] and they have accepted transfer to their service, Dr. Norma Fredrickson. I discussed with patient's daughter Ms. Franklin by telephone and she understood and was agreeable to transfer.  Dysphagia. --Continue dysphagia 3, thin liquid diet per speech therapy  Essential hypertension. --Stable.  Diabetes type II listed in chart, however hemoglobin A1c 5.0 and blood sugars are stable and patient not on any outpatient medications.  Therefore this diagnosis is doubted. --Blood sugars stable.  No indication for sliding scale insulin.  Hypothyroidism --Continue levothyroxine.  Note that in chart review, on admission patient had 1/2+ blood cultures with aerococcus.  No leukocytosis, no fever.  No reason to suspect true infection.  Discussed with infectious disease, this is felt to be contaminant.  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   Consultants:  Neurology   Procedures:  3/10 EEG Clinical Interpretation: ThisEEG is consistent with an area of epileptogenic potential in the left posterior quadrant. There was no seizure recorded on this study.   3/13 video EEG monitoring report This EEG is indicative of an area of structural abnormality and of an epileptogenic zone in the right parietal-central region. One electrographic seizure was recorded. Additionally, there is a moderate diffuse encephalopathy, non-specific as to etiology.  Over the course of the recording, there is an improvement in both the degree of encephalopathy and in the frequency of the epileptiform discharges.  3/14 video EEG  monitoring report This EEG is indicative of  an area of structural abnormalityand/or cortical dysfunction in the right hemisphere. Moreover, there isan epileptogenic zone in the right parietal-central region.Compared to previous epoch, no seizures are recorded and there are fewer epileptiform discharges.   The right hemisphere now displays normal rhythms suggesting resolution of the previously seen diffuse encephalopathy.  3/15 video EEG monitoring report This EEG is indicative of an area of structural abnormality and/or cortical dysfunction in the right hemisphere. Moreover, there is an epileptogenic zone in the right parietal-central region. Compared to previous epoch, there areevenfewer epileptiform discharges.No seizures are recorded since 3/12 @01 :26.  3/16 video EEG monitoring report SUMMARY: This was an abnormal continuous video EEGdue to left hemispheric focal slowing with superimposed epileptiform discharges, indicative of a focal cerebral disturbance with epileptogenic potential. No seizures were seen. There was also evidence of a superimposed mild global cerebral disturbance.  3/16 PICC  3/17 video EEG monitoring report SUMMARY: This was an abnormal continuous video EEGdue to left hemispheric focal slowing with superimposedgeneralized slowing. This record was improved compared to the previous day with no seizures and no apparent epileptiform discharges.   3/18 video EEG monitoring report SUMMARY: This was an abnormal continuous video EEGdue to left hemispheric focal slowing with superimposedgeneralized slowing. This record wasstable compared to the previous day with no seizures and no apparent epileptiform discharges.  3/19 video EEG monitoring report SUMMARY: This was an abnormal continuous video EEGdue to left hemispheric focal slowing with superimposedgeneralized slowing. This record wasstable compared to the previous day with no seizures and no apparent  epileptiform discharges.  Today's assessment: S: Patient seems to feel okay but history is unreliable secondary to fluent aphasia. O: Vitals:  Vitals:   10/15/17 0544 10/15/17 0812  BP: (!) 164/74 (!) 151/87  Pulse:  76  Resp:  18  Temp:  98 F (36.7 C)  SpO2:  100%  Constitutional:   Appears calm and comfortable Eyes:   pupils and irises appear normal  Normal lids  ENMT:   grossly normal hearing   Lips appear normal Respiratory:   CTA bilaterally, no w/r/r.   Respiratory effort normal Cardiovascular:   RRR, 2/6 systolic murmur left upper sternal border.  No rub or gallop.  No LE extremity edema   Abdomen:   Soft, nontender, nondistended Musculoskeletal:   RUE: Weak, wrist flexed.  Left upper extremity: Moves spontaneously. Skin:   No rashes, lesions, ulcers noted Psychiatric:   Mental status ? Mood, affect appear unremarkable  Speech is fluent but inappropriate, "word salad"   Discharge Instructions   Allergies as of 10/15/2017      Reactions   Tape Rash   Meperidine Hcl Nausea And Vomiting   Sulfonamide Derivatives Itching   Bacitracin-polymyxin B Dermatitis   "Cloth Band-Aid only"   Oxycodone Itching      Medication List    STOP taking these medications   Melatonin 10 MG Tabs   multivitamin capsule   pantoprazole 40 MG tablet Commonly known as:  PROTONIX   STOOL SOFTENER 100 MG capsule Generic drug:  docusate sodium   traMADol 50 MG tablet Commonly known as:  ULTRAM     TAKE these medications   acetaminophen 325 MG tablet Commonly known as:  TYLENOL Take 1 tablet by mouth every 8 (eight) hours as needed.   amLODipine 10 MG tablet Commonly known as:  NORVASC TAKE 1 TABLET EVERY DAY What changed:    how much to take  how to take this  when to take this  Another medication  with the same name was removed. Continue taking this medication, and follow the directions you see here.   aspirin 81 MG tablet Take 81 mg by  mouth daily.   calcium carbonate 1250 (500 Ca) MG tablet Commonly known as:  OS-CAL - dosed in mg of elemental calcium 1 tablet daily.   ferrous sulfate 325 (65 FE) MG tablet Take 1 tablet by mouth daily with breakfast.   lacosamide 200 MG Tabs tablet Commonly known as:  VIMPAT Take 1 tablet (200 mg total) by mouth 2 (two) times daily.   levETIRAcetam 500 MG tablet Commonly known as:  KEPPRA Take 4 tablets (2,000 mg total) by mouth 2 (two) times daily. What changed:  how much to take   levothyroxine 100 MCG tablet Commonly known as:  SYNTHROID, LEVOTHROID TAKE 1 TABLET EVERY DAY What changed:  Another medication with the same name was removed. Continue taking this medication, and follow the directions you see here.   losartan 50 MG tablet Commonly known as:  COZAAR Take 50 mg by mouth daily.   LYRICA 50 MG capsule Generic drug:  pregabalin   mirtazapine 15 MG tablet Commonly known as:  REMERON Take 1 tablet by mouth at bedtime.   phenytoin 50 MG tablet Commonly known as:  DILANTIN Chew 2 tablets (100 mg total) by mouth 3 (three) times daily.   polyethylene glycol packet Commonly known as:  MIRALAX / GLYCOLAX Take 17 g by mouth daily as needed.   senna-docusate 8.6-50 MG tablet Commonly known as:  Senokot-S Take 1 tablet by mouth 2 (two) times daily.   Vitamin D3 1000 units Caps Take by mouth.            Durable Medical Equipment  (From admission, onward)        Start     Ordered   10/10/17 1557  For home use only DME Trapeze  Once     10/10/17 1556     Allergies  Allergen Reactions  . Tape Rash  . Meperidine Hcl Nausea And Vomiting  . Sulfonamide Derivatives Itching  . Bacitracin-Polymyxin B Dermatitis    "Cloth Band-Aid only"  . Oxycodone Itching    The results of significant diagnostics from this hospitalization (including imaging, microbiology, ancillary and laboratory) are listed below for reference.    Significant Diagnostic  Studies: Ct Angio Head W Or Wo Contrast  Result Date: 10/05/2017 CLINICAL DATA:  Slurred speech.  Prior stroke. EXAM: CT ANGIOGRAPHY HEAD AND NECK CT PERFUSION BRAIN TECHNIQUE: Multidetector CT imaging of the head and neck was performed using the standard protocol during bolus administration of intravenous contrast. Multiplanar CT image reconstructions and MIPs were obtained to evaluate the vascular anatomy. Carotid stenosis measurements (when applicable) are obtained utilizing NASCET criteria, using the distal internal carotid diameter as the denominator. Multiphase CT imaging of the brain was performed following IV bolus contrast injection. Subsequent parametric perfusion maps were calculated using RAPID software. CONTRAST:  90mL ISOVUE-370 IOPAMIDOL (ISOVUE-370) INJECTION 76% COMPARISON:  None. FINDINGS: CTA NECK FINDINGS Aortic arch: Normal variant aortic arch branching pattern with common origin of brachiocephalic and left common carotid arteries. Mild arch atherosclerosis. Widely patent arch vessel origins. Right carotid system: Patent with mild calcified and soft plaque at the carotid bifurcation. No significant stenosis or evidence of dissection. Left carotid system: Patent with minimal calcified plaque at the carotid bifurcation. No stenosis or evidence of dissection. Vertebral arteries: Patent and codominant without evidence of stenosis or dissection. Skeleton: Remote, solid interbody osseous fusion  at C4-5. Bilateral C4-5 facet ankylosis. Multilevel disc degeneration, advanced at C5-6 and C6-7. Bulky posterior spurring/disc osteophyte complex at C5-6 results in moderate spinal stenosis. Other neck: No mass or enlarged lymph nodes. Upper chest: Clear lung apices. Review of the MIP images confirms the above findings CTA HEAD FINDINGS Anterior circulation: The internal carotid arteries are patent from skull base to carotid termini with mild nonstenotic siphon atherosclerosis bilaterally. ACAs and MCAs are  patent without evidence of proximal branch occlusion or significant proximal stenosis. No aneurysm or vascular malformation. Posterior circulation: The intracranial vertebral arteries are widely patent to the basilar. PICAs and SCAs are unremarkable. AICAs are small and not well evaluated. The basilar artery is widely patent. There is a small right posterior communicating artery. PCAs are patent without evidence of significant stenosis. No aneurysm or vascular malformation. Venous sinuses: As permitted by contrast timing, patent. Anatomic variants: None. Delayed phase: Not performed. 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 IMPRESSION: 1. No large vessel occlusion. 2. Mild intracranial and cervical atherosclerosis without significant stenosis or aneurysm. 3. No evidence of acute ischemia on perfusion imaging. 4. Increased cortical blood flow throughout the posterior left cerebral hemisphere, query seizure activity in the setting of an old left parietal cortical infarct. These results were called by telephone at the time of interpretation on 10/05/2017 at 2:43 pm to Dr. Ritta Slot , who verbally acknowledged these results. Electronically Signed   By: Sebastian Ache M.D.   On: 10/05/2017 14:58   Ct Head Wo Contrast  Result Date: 10/15/2017 CLINICAL DATA:  Seizure. Aphasia. History of intracranial hemorrhage. EXAM: CT HEAD WITHOUT CONTRAST TECHNIQUE: Contiguous axial images were obtained from the base of the skull through the vertex without intravenous contrast. COMPARISON:  10/05/2017 FINDINGS: Brain: There is no evidence of acute infarct, intracranial  hemorrhage, mass, midline shift, or extra-axial fluid collection. A chronic left parietal infarct is again noted, and there are also chronic lacunar infarcts in the right thalamus and left cerebellum. There is ex vacuo enlargement of the left greater than right lateral and third ventricles related to the old parietal infarct as well as central predominant cerebral atrophy. Periventricular white matter hypodensities elsewhere are unchanged and nonspecific but compatible with mild chronic small vessel ischemic disease. Vascular: Calcified atherosclerosis at the skull base. No hyperdense vessel. Skull: No fracture or focal osseous lesion. Sinuses/Orbits: Tiny left maxillary sinus mucous retention cyst. Clear mastoid air cells. Unremarkable orbits. Other: None. IMPRESSION: 1. No evidence of acute intracranial abnormality. 2. Chronic left parietal infarct. 3. Chronic small vessel ischemia as above. Electronically Signed   By: Sebastian Ache M.D.   On: 10/15/2017 09:31   Ct Angio Neck W Or Wo Contrast  Result Date: 10/05/2017 CLINICAL DATA:  Slurred speech.  Prior stroke. EXAM: CT ANGIOGRAPHY HEAD AND NECK CT PERFUSION BRAIN TECHNIQUE: Multidetector CT imaging of the head and neck was performed using the standard protocol during bolus administration of intravenous contrast. Multiplanar CT image reconstructions and MIPs were obtained to evaluate the vascular anatomy. Carotid stenosis measurements (when applicable) are obtained utilizing NASCET criteria, using the distal internal carotid diameter as the denominator. Multiphase CT imaging of the brain was performed following  IV bolus contrast injection. Subsequent parametric perfusion maps were calculated using RAPID software. CONTRAST:  90mL ISOVUE-370 IOPAMIDOL (ISOVUE-370) INJECTION 76% COMPARISON:  None. FINDINGS: CTA NECK FINDINGS Aortic arch: Normal variant aortic arch branching pattern with common origin of brachiocephalic and left common carotid arteries. Mild arch  atherosclerosis. Widely patent arch vessel origins. Right carotid system: Patent with mild calcified and soft plaque at the carotid bifurcation. No significant stenosis or evidence of dissection. Left carotid system: Patent with minimal calcified plaque at the carotid bifurcation. No stenosis or evidence of dissection. Vertebral arteries: Patent and codominant without evidence of stenosis or dissection. Skeleton: Remote, solid interbody osseous fusion at C4-5. Bilateral C4-5 facet ankylosis. Multilevel disc degeneration, advanced at C5-6 and C6-7. Bulky posterior spurring/disc osteophyte complex at C5-6 results in moderate spinal stenosis. Other neck: No mass or enlarged lymph nodes. Upper chest: Clear lung apices. Review of the MIP images confirms the above findings CTA HEAD FINDINGS Anterior circulation: The internal carotid arteries are patent from skull base to carotid termini with mild nonstenotic siphon atherosclerosis bilaterally. ACAs and MCAs are patent without evidence of proximal branch occlusion or significant proximal stenosis. No aneurysm or vascular malformation. Posterior circulation: The intracranial vertebral arteries are widely patent to the basilar. PICAs and SCAs are unremarkable. AICAs are small and not well evaluated. The basilar artery is widely patent. There is a small right posterior communicating artery. PCAs are patent without evidence of significant stenosis. No aneurysm or vascular malformation. Venous sinuses: As permitted by contrast timing, patent. Anatomic variants: None. Delayed phase: Not performed. Review of the MIP images confirms the above findings CT Brain Perfusion Findings: CBF (<30%) Volume: 0mL Perfusion (Tmax>6.0s) volume: 0mL Mismatch Volume: n/a Infarction Location: n/a There is increased cerebral blood volume and blood flow diffusely throughout cortex in the posterior left temporal, left occipital, and left parietal lobes in the setting of an old high left parietal  cortical infarct. IMPRESSION: 1. No large vessel occlusion. 2. Mild intracranial and cervical atherosclerosis without significant stenosis or aneurysm. 3. No evidence of acute ischemia on perfusion imaging. 4. Increased cortical blood flow throughout the posterior left cerebral hemisphere, query seizure activity in the setting of an old left parietal cortical infarct. These results were called by telephone at the time of interpretation on 10/05/2017 at 2:43 pm to Dr. Ritta Slot , who verbally acknowledged these results. Electronically Signed   By: Sebastian Ache M.D.   On: 10/05/2017 14:58   Ct Cerebral Perfusion W Contrast  Result Date: 10/05/2017 CLINICAL DATA:  Slurred speech.  Prior stroke. EXAM: CT ANGIOGRAPHY HEAD AND NECK CT PERFUSION BRAIN TECHNIQUE: Multidetector CT imaging of the head and neck was performed using the standard protocol during bolus administration of intravenous contrast. Multiplanar CT image reconstructions and MIPs were obtained to evaluate the vascular anatomy. Carotid stenosis measurements (when applicable) are obtained utilizing NASCET criteria, using the distal internal carotid diameter as the denominator. Multiphase CT imaging of the brain was performed following IV bolus contrast injection. Subsequent parametric perfusion maps were calculated using RAPID software. CONTRAST:  90mL ISOVUE-370 IOPAMIDOL (ISOVUE-370) INJECTION 76% COMPARISON:  None. FINDINGS: CTA NECK FINDINGS Aortic arch: Normal variant aortic arch branching pattern with common origin of brachiocephalic and left common carotid arteries. Mild arch atherosclerosis. Widely patent arch vessel origins. Right carotid system: Patent with mild calcified and soft plaque at the carotid bifurcation. No significant stenosis or evidence of dissection. Left carotid system: Patent with minimal calcified plaque at the carotid bifurcation. No stenosis or  evidence of dissection. Vertebral arteries: Patent and codominant without  evidence of stenosis or dissection. Skeleton: Remote, solid interbody osseous fusion at C4-5. Bilateral C4-5 facet ankylosis. Multilevel disc degeneration, advanced at C5-6 and C6-7. Bulky posterior spurring/disc osteophyte complex at C5-6 results in moderate spinal stenosis. Other neck: No mass or enlarged lymph nodes. Upper chest: Clear lung apices. Review of the MIP images confirms the above findings CTA HEAD FINDINGS Anterior circulation: The internal carotid arteries are patent from skull base to carotid termini with mild nonstenotic siphon atherosclerosis bilaterally. ACAs and MCAs are patent without evidence of proximal branch occlusion or significant proximal stenosis. No aneurysm or vascular malformation. Posterior circulation: The intracranial vertebral arteries are widely patent to the basilar. PICAs and SCAs are unremarkable. AICAs are small and not well evaluated. The basilar artery is widely patent. There is a small right posterior communicating artery. PCAs are patent without evidence of significant stenosis. No aneurysm or vascular malformation. Venous sinuses: As permitted by contrast timing, patent. Anatomic variants: None. Delayed phase: Not performed. 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 IMPRESSION: 1. No large vessel occlusion. 2. Mild intracranial and cervical atherosclerosis without significant stenosis or aneurysm. 3. No evidence of acute ischemia on perfusion imaging. 4. Increased cortical blood flow throughout the posterior left cerebral hemisphere, query seizure activity in the setting of an old left parietal cortical infarct.  These results were called by telephone at the time of interpretation on 10/05/2017 at 2:43 pm to Dr. Ritta Slot , who verbally acknowledged these results. Electronically Signed   By: Sebastian Ache M.D.   On: 10/05/2017 14:58   Dg Chest Port 1 View  Result Date: 10/09/2017 CLINICAL DATA:  Fever. EXAM: PORTABLE CHEST 1 VIEW COMPARISON:  No recent prior. FINDINGS: Mediastinum hilar structures normal. Heart size normal. No pulmonary venous congestion. Cardiac pacer with lead tips over the right atrium right ventricle. Surgical clips right upper quadrant. IMPRESSION: Cardiac pacer noted with lead tips over the right atrium right ventricle. Heart size normal. No pulmonary venous congestion. 2.  Low lung volumes with mild basilar atelectasis P Electronically Signed   By: Maisie Fus  Register   On: 10/09/2017 09:55   Ct Head Code Stroke Wo Contrast  Result Date: 10/05/2017 CLINICAL DATA:  Code stroke. Slurred speech. Prior stroke with residual right-sided deficits. EXAM: CT HEAD WITHOUT CONTRAST TECHNIQUE: Contiguous axial images were obtained from the base of the skull through the vertex without intravenous contrast. COMPARISON:  01/31/2005 FINDINGS: Brain: There is no evidence of acute infarct, intracranial hemorrhage, mass, midline shift, or extra-axial fluid collection. There is a chronic left frontoparietal cortical and subcortical infarct near the vertex which is new from the prior study. Mild lateral and third ventriculomegaly is new and felt to reflect central predominant cerebral atrophy rather than hydrocephalus. Periventricular white matter hypodensities are nonspecific but compatible with mild chronic small vessel ischemic disease. A chronic subcentimeter infarct in the left cerebellum is new. Vascular: Calcified atherosclerosis at the skull base. No hyperdense vessel. Skull: No fracture focal osseous lesion. Sinuses/Orbits: Tiny left maxillary sinus mucous retention cyst. Clear mastoid air cells.  Unremarkable orbits. Other: None. ASPECTS [facility] Stroke Program Early CT Score) - Ganglionic  level infarction (caudate, lentiform nuclei, internal capsule, insula, M1-M3 cortex): 7 - Supraganglionic infarction (M4-M6 cortex): 3 Total score (0-10 with 10 being normal): 10 IMPRESSION: 1. No evidence of acute intracranial abnormality. 2. ASPECTS is 10. 3. 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 4. Mild chronic small vessel ischemic disease and cerebral atrophy. These results were called by telephone at the time of interpretation on 10/05/2017 at 12:49 pm to Dr. Gwyneth Sprout , who verbally acknowledged these results. Electronically Signed   By: Sebastian Ache M.D.   On: 10/05/2017 12:50   [place] Ekg Site Rite  Result Date: 10/11/2017 If Site Rite image not attached, placement could not be confirmed due to current cardiac rhythm.   Microbiology: Recent Results (from the past 240 hour(s))  Culture, blood (routine x 2)     Status: Abnormal   Collection Time: 10/09/17  7:25 AM  Result Value Ref Range Status   Specimen Description BLOOD RIGHT HAND  Final   Special Requests AEROCOCCUS SPECIES Blood Culture adequate volume (A)  Final   Culture   Final    NO GROWTH 5 DAYS Performed at [facility], [address], [place], [place] [address]    Report Status 10/14/2017 FINAL  Final  Culture, blood (routine x 2)     Status: None   Collection Time: 10/09/17  7:33 AM  Result Value Ref Range Status   Specimen Description BLOOD LEFT ANTECUBITAL  Final   Special Requests   Final    BOTTLES DRAWN AEROBIC ONLY Blood Culture adequate volume   Culture   Final    NO GROWTH 5 DAYS Performed at [facility], [address], [place], [place] [address]    Report Status 10/14/2017 FINAL  Final     Labs: Basic Metabolic Panel: Recent Labs  Lab 10/09/17 0734 10/12/17 0717 10/13/17 0516 10/14/17 0500 10/15/17 0357  NA 137 142 140 140 140  K 3.3* 4.2 4.2 4.4 4.4  CL  107 115* 110 108 109  CO2 21* 19* 21* 24 24  GLUCOSE 118* 78 78 72 75  BUN 16 12 10 6 6   CREATININE 0.97 0.79 0.67 0.66 0.66  CALCIUM 8.7* 8.8* 8.7* 8.7* 8.8*  MG  --  1.8 1.8 2.0 1.8   Liver Function Tests: Recent Labs  Lab 10/09/17 0734 10/13/17 0516 10/15/17 0357  AST 17 17 17   ALT 13* 11* 11*  ALKPHOS 52 44 45  BILITOT 0.5 0.4 0.4  PROT 6.6 6.4* 6.6  ALBUMIN 3.0* 2.5* 2.6*   CBC: Recent Labs  Lab 10/09/17 0734 10/12/17 0717 10/13/17 0516 10/15/17 0357  WBC 9.8 6.6 6.5 8.5  NEUTROABS  --  3.2 3.1 4.3  HGB 12.1 11.1* 12.2 11.2*  HCT 36.6 34.5* 37.1 34.5*  MCV 86.9 87.3 87.7 87.1  PLT 166 143* 168 208   CBG: Recent Labs  Lab 10/08/17 2123 10/09/17 0622 10/09/17 1132 10/09/17 1612 10/09/17 2205  GLUCAP 139* 96 110* 103* 103*    Principal Problem:   Expressive aphasia Active Problems:   Hypothyroidism   Essential hypertension   Seizure (HCC)   Dysphagia   Time coordinating discharge: 45 minutes  Signed:  Brendia Sacks, MD [facility] 10/15/2017, 11:00 AM

## 2017-10-15 NOTE — Progress Notes (Signed)
Patient discharging to [facility]. All paper work and belonging sent with transport. Nurse will call report.

## 2017-10-15 NOTE — Telephone Encounter (Signed)
Spoke with patient's daughter, [name], as [facility] is [facility], but is being [facility] to Dr. Norma FredricksonFarr in Neurology [informed that we do not have the capability to do MRI for patient with her cardiac pacemaker]. Forms have been mailed to patient c/o daughter so that she will be able to have patient complete her section on the forms, as this will be her best option at current time. They will keep us updated to make sure we are capable of seeing pt's notes in Care Everywhere from [facility] and/or let us know if they need anything/SLS 03/20

## 2017-10-15 NOTE — Progress Notes (Addendum)
PROGRESS NOTE  Kathleen ChickRovena Jensen WUJ:[id number] DOB: [birth year] DOA: 10/05/2017 PCP: Donato SchultzLowne Jensen, Kathleen Jensen, Kathleen Jensen  Brief Narrative: 67 year old woman PMH stroke November 2018 resulting in aphasia, right-sided weakness, reported full recovery from speech disturbance.  By history, some kind of bleeding associated with the stroke.  Not on any outpatient antiplatelet agents.  Presented 3/10 with slurred speech and aphasia.  She was seen by tele-neurology and transferred [facility] for [facility].  His EEG showed cortical irritability in a patient subsequently underwent long-term EEG monitoring which continues to show left hemispheric focal slowing.  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  CT angios, perfusion of the head without acute abnormality.  Not a TPA candidate per neurology.  Subclinical seizures were considered.  Loaded with Keppra, Dilantin.  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  No seizure was recorded.  Aphasia persisted, Vimpat ordered.  Symptoms persisted and long-term monitoring EEG ordered 3/12. Video EEG showed structural abnormality, epileptogenic right parietal central region, one electrographic seizure, moderate diffuse encephalopathy.  This gradually improved with resolution of seizures but no clinical improvement was observed. --Continue management per neurology including antiepileptic drugs (Vimpat, Keppra, Dilantin (. I discussed with Dr. Wilford CornerArora, he recommends [facility] for [facility] as they have a special MRI protocol for pacemakers.  Also PET scan would be considered.  He will discuss with neurology.  Dysphagia. --Continue dysphagia 3, thin liquid diet per speech therapy  Essential hypertension. --Stable.  Diabetes type II listed in chart, however hemoglobin A1c 5.0 and blood sugars are stable and patient  not on any outpatient medications.  Therefore this diagnosis is doubted. --Blood sugars stable.  No indication for sliding scale insulin.  Hypothyroidism --Continue levothyroxine.  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  DVT prophylaxis: Enoxaparin Code Status: Full Family Communication: None Disposition Plan: Pending   Brendia Sacksaniel Jammy Plotkin, MD  [facility] Direct contact: [phone] --Via amion app OR  --[email or web address]; password [email or web address]  7PM-7AM contact night coverage as above 10/15/2017, 9:51 AM  LOS: 10 days   Consultants:  Neurology   Procedures:  3/10 EEG Clinical Interpretation: This EEG is consistent with an area of epileptogenic potential in the left posterior quadrant. There was no seizure recorded on this study.   3/13 video EEG monitoring report This EEG is indicative of an area of structural abnormality and of an epileptogenic zone in the right parietal-central region. One electrographic seizure was recorded. Additionally, there is a moderate diffuse encephalopathy, non-specific as to etiology.  Over the course of the recording, there is an improvement in both the degree of encephalopathy and in the frequency of the epileptiform discharges.   3/14 video EEG monitoring report This EEG is indicative of an area of structural abnormality and/or cortical dysfunction in the right hemisphere. Moreover, there is an epileptogenic zone in the right parietal-central region. Compared to previous epoch, no seizures are recorded and there are fewer epileptiform discharges.   The right hemisphere now displays normal rhythms suggesting resolution of the previously seen diffuse encephalopathy.   3/15 video EEG monitoring report This EEG is indicative of an area of structural abnormality and/or cortical dysfunction in the right hemisphere. Moreover, there is an epileptogenic zone in the right parietal-central region.  Compared to previous epoch, there are even fewer epileptiform discharges. No seizures are recorded since 3/12 @  01:26.   3/16 video EEG monitoring report SUMMARY: This was an abnormal continuous video EEG due to left hemispheric focal slowing with superimposed epileptiform discharges, indicative of a focal cerebral disturbance with epileptogenic potential. No seizures were seen. There was also evidence of a superimposed mild global cerebral disturbance.  3/16 PICC  3/17 video EEG monitoring report SUMMARY: This was an abnormal continuous video EEGdue to left hemispheric focal slowing with superimposed generalized slowing. This record was improved compared to the previous day with no seizures and no apparent epileptiform discharges.    3/18 video EEG monitoring report SUMMARY: This was an abnormal continuous video EEGdue to left hemispheric focal slowing with superimposedgeneralized slowing. This record was stable compared to the previous day with no seizures and no apparent epileptiform discharges.  3/19 video EEG monitoring report SUMMARY: This was an abnormal continuous video EEGdue to left hemispheric focal slowing with superimposedgeneralized slowing. This record wasstable compared to the previous day with no seizures and no apparent epileptiform discharges.  Antimicrobials:    Interval history/Subjective: Patient seems to feel okay but history is unreliable secondary to fluent aphasia.  Objective: Vitals:  Vitals:   10/15/17 0544 10/15/17 0812  BP: (!) 164/74 (!) 151/87  Pulse:  76  Resp:    Temp:  98 F (36.7 C)  SpO2:  100%    Exam:  Constitutional:  . Appears calm and comfortable Eyes:  . pupils and irises appear normal . Normal lids  ENMT:  . grossly normal hearing  . Lips appear normal Respiratory:  . CTA bilaterally, no w/Jensen/Jensen.  . Respiratory effort normal Cardiovascular:  . RRR, 2/6 systolic murmur left upper sternal border.  No rub or  gallop. . No LE extremity edema   Abdomen:  . Soft, nontender, nondistended Musculoskeletal:  . RUE: Weak, wrist flexed. Marland Kitchen Left upper extremity: Moves spontaneously. Skin:  . No rashes, lesions, ulcers noted Psychiatric:  . Mental status o Mood, affect appear unremarkable . Speech is fluent but inappropriate, "word salad"   I have personally reviewed the following:   Labs:  Complete metabolic panel unremarkable.  Magnesium within normal limits.  CBC unremarkable  Scheduled Meds: . aspirin EC  81 mg Oral Daily  . atorvastatin  10 mg Oral q1800  . cholecalciferol  1,000 Units Oral q morning - 10a  . enoxaparin (LOVENOX) injection  40 mg Subcutaneous Q24H  . ferrous sulfate  325 mg Oral Q breakfast  . levothyroxine  100 mcg Oral Daily  . mirtazapine  15 mg Oral QHS  . phenytoin (DILANTIN) IV  100 mg Intravenous Q8H  . potassium chloride  40 mEq Oral BID  . pregabalin  25 mg Oral Daily  . senna-docusate  1 tablet Oral BID  . sodium chloride flush  10-40 mL Intracatheter Q12H   Continuous Infusions: . lacosamide (VIMPAT) IV Stopped (10/15/17 0000)  . levETIRAcetam Stopped (10/15/17 0537)    Principal Problem:   Expressive aphasia Active Problems:   Hypothyroidism   Essential hypertension   Seizure (HCC)   Dysphagia   LOS: 10 days    Time spent greater than 35 minutes, coordination of care, discussion with neurology, review as above.

## 2017-10-15 NOTE — Progress Notes (Signed)
Neurology Progress Note   S:// No acute changes  O:// Current vital signs: BP (!) 164/74 (BP Location: Left Arm)   Pulse 76   Temp 98.4 F (36.9 C) (Axillary)   Resp 19   Ht 5\' 4"  (1.626 m)   Wt 83.7 kg (184 lb 8.4 oz)   SpO2 99%   BMI 31.67 kg/m  Vital signs in last 24 hours: Temp:  [98.4 F (36.9 C)-98.9 F (37.2 C)] 98.4 F (36.9 C) (03/20 0436) Pulse Rate:  [59-76] 76 (03/20 0436) Resp:  [16-19] 19 (03/20 0006) BP: (134-166)/(74-112) 164/74 (03/20 0544) SpO2:  [98 %-100 %] 99 % (03/20 0436) Unchanged neurological exam Gen: WD WN NAD HEENT: [place] AT MMM CVS: S1S2+, rrr Abd: ND NT Neurological exam Awake, alert, unable to assess orientation due to aphasia. Is able to say her name.  Upon asking the name other objects continues to repeat her name.  Is able to speak some sentences which make absolutely no sense. Cranial nerves: Pupils equal round reactive, extraocular movement intact, blinks bilaterally to threat, flattened right nasolabial fold. Motor exam: Left upper and lower extremity 5/5.  Trace movement on the right upper extremity and right lower extremity. Sensory: Intact to noxious edematous Coordination: Intact on left based on the reach for objects but unable to test due to her inability to follow commands.  No involuntary movements twitching or jerking noticed.  Medications  Current Facility-Administered Medications:  .  acetaminophen (TYLENOL) tablet 650 mg, 650 mg, Oral, Q4H PRN, 650 mg at 10/13/17 0553 **OR** acetaminophen (TYLENOL) solution 650 mg, 650 mg, Per Tube, Q4H PRN, 650 mg at 10/08/17 1952 **OR** acetaminophen (TYLENOL) suppository 650 mg, 650 mg, Rectal, Q4H PRN, Black, Karen M, NP .  aspirin EC tablet 81 mg, 81 mg, Oral, Daily, Dow Adolph N, DO, 81 mg at 10/14/17 0925 .  atorvastatin (LIPITOR) tablet 10 mg, 10 mg, Oral, q1800, Costello, Mary A, NP, 10 mg at 10/14/17 1742 .  cholecalciferol (VITAMIN D) tablet 1,000 Units, 1,000 Units, Oral, q  morning - 10a, Darlin Drop, DO, 1,000 Units at 10/14/17 1610 .  enoxaparin (LOVENOX) injection 40 mg, 40 mg, Subcutaneous, Q24H, Black, Karen M, NP, 40 mg at 10/14/17 2234 .  ferrous sulfate tablet 325 mg, 325 mg, Oral, Q breakfast, Dow Adolph N, DO, 325 mg at 10/14/17 9604 .  hydrALAZINE (APRESOLINE) injection 5 mg, 5 mg, Intravenous, Q4H PRN, Black, Karen M, NP .  lacosamide (VIMPAT) 200 mg in sodium chloride 0.9 % 25 mL IVPB, 200 mg, Intravenous, Q12H, Caryl Pina, MD, Stopped at 10/15/17 0000 .  levETIRAcetam (KEPPRA) 2,000 mg in sodium chloride 0.9 % 100 mL IVPB, 2,000 mg, Intravenous, Q12H, Costello, Lamar Blinks, NP, Stopped at 10/15/17 [phone] .  levothyroxine (SYNTHROID, LEVOTHROID) tablet 100 mcg, 100 mcg, Oral, Daily, Dow Adolph N, DO, 100 mcg at 10/14/17 0925 .  mirtazapine (REMERON) tablet 15 mg, 15 mg, Oral, QHS, Hall, Carole N, DO, 15 mg at 10/14/17 2234 .  phenytoin (DILANTIN) injection 100 mg, 100 mg, Intravenous, Q8H, Alekh, Kshitiz, MD, 100 mg at 10/15/17 0617 .  potassium chloride SA (K-DUR,KLOR-CON) CR tablet 40 mEq, 40 mEq, Oral, BID, Darlin Drop, DO, 40 mEq at 10/14/17 2234 .  pregabalin (LYRICA) capsule 25 mg, 25 mg, Oral, Daily, Hall, Carole N, DO, 25 mg at 10/14/17 0925 .  senna-docusate (Senokot-S) tablet 1 tablet, 1 tablet, Oral, QHS PRN, Gwenyth Bender, NP, 1 tablet at 10/09/17 2112 .  senna-docusate (Senokot-S) tablet 1 tablet,  1 tablet, Oral, BID, Darlin Drop, DO, 1 tablet at 10/14/17 2234 .  sodium chloride flush (NS) 0.9 % injection 10-40 mL, 10-40 mL, Intracatheter, Q12H, Alekh, Kshitiz, MD, 10 mL at 10/15/17 0356 Labs CBC    Component Value Date/Time   WBC 8.5 10/15/2017 0357   RBC 3.96 10/15/2017 0357   HGB 11.2 (L) 10/15/2017 0357   HCT 34.5 (L) 10/15/2017 0357   PLT 208 10/15/2017 0357   MCV 87.1 10/15/2017 0357   MCH 28.3 10/15/2017 0357   MCHC 32.5 10/15/2017 0357   RDW 13.5 10/15/2017 0357   LYMPHSABS 3.1 10/15/2017 0357   MONOABS 0.7  10/15/2017 0357   EOSABS 0.3 10/15/2017 0357   BASOSABS 0.0 10/15/2017 0357    CMP     Component Value Date/Time   NA 140 10/15/2017 0357   K 4.4 10/15/2017 0357   CL 109 10/15/2017 0357   CO2 24 10/15/2017 0357   GLUCOSE 75 10/15/2017 0357   GLUCOSE 122 (H) 07/07/2006 1222   BUN 6 10/15/2017 0357   CREATININE 0.66 10/15/2017 0357   CALCIUM 8.8 (L) 10/15/2017 0357   PROT 6.6 10/15/2017 0357   ALBUMIN 2.6 (L) 10/15/2017 0357   AST 17 10/15/2017 0357   ALT 11 (L) 10/15/2017 0357   ALKPHOS 45 10/15/2017 0357   BILITOT 0.4 10/15/2017 0357   GFRNONAA >60 10/15/2017 0357   GFRAA >60 10/15/2017 0357   Imaging I have reviewed images in epic and the results pertinent to this consultation are: CT-scan of the brain 3/10 - left fronto parietal encephalomalacia CT-head 3/20 - no acute change.  Assessment:  67 year old with past history of left-sided ICH presented with right-sided weakness and aphasia earlier in the year, initially evaluated for stroke but found to have seizures that responded well to Dilantin.  Presented with similar presentation to [facility] on 10/05/2017 with initial EEG findings suggestive of cortical irritability but no seizures.  She was started on Dilantin in addition of home dose of Keppra.  Vimpat and Depakote were then added because of the continuing aphasia on exam.  EEG for the past 3-4 days has been only suggestive of left hemispheric focal slowing with superimposed generalized slowing.  We were unable to obtain an MRI because of her pacemaker which has been deemed to be unsafe for the MRI at our facility.  I also communicated with the radiologist at our another facility regarding the possibility to do a PET scan but unfortunately we are unable to perform PET scans here at our facility. Given that her symptoms are currently not improving, the differentials are ongoing seizure activity versus a new stroke in the left hemisphere. MRI imaging and may be PET imaging  following that would give [place] a better sense of what the underlying etiology is and might guide treatment better. My colleague who was on service before me taking care of the patient, I discussed with the family the possibility of intubating the patient and pursuing burst suppression in case the seizures were deeper and not being seen well on EEG but the family was reluctant to go that route unless absolutely necessary.  I have put out a call for potential transfer to [facility] where the patient was able to get an MRI in January and again might be able to get an MRI now, and potentially get a PET scan as well.  I will repeat a CT scan of the brain to assess for any interval change since admission, as I wait further  information on potential transfers.  For now continue with current antiepileptics and I will update my recommendations as I hear more from the transfer center [facility].   -- Milon DikesAshish Johnhenry Tippin, MD [facility] Pager: [phone] If 7pm to 7am, please call on call as listed on AMION.  Addendum at 8:51 AM Spoke with the neurologist at [facility].  They were reviewed the patient's chart and get back to me. Patient is pending a noncontrast CT of the head [facility] at [facility]. Further recommendations based on the CT and continuing conversations with the neurologist [facility].  ADDENDUM 1038 hrs CTH - no new changes. Spoke back with Dr. Norm[facility]Farr at [facility]. She has accepted pt to neuro floor [facility]. [facility] will arrange for transfer. I have paged the  hospitalist for the update and also call daughter, who I spoke with earlier in the AM.  -- Milon DikesAshish Ermel Verne, MD [facility] Pager: [phone] If 7pm to 7am, please call on call as listed on AMION.

## 2017-10-31 DIAGNOSIS — I69951 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: Secondary | ICD-10-CM | POA: Diagnosis not present

## 2017-10-31 DIAGNOSIS — G4089 Other seizures: Secondary | ICD-10-CM | POA: Diagnosis not present

## 2017-10-31 DIAGNOSIS — N390 Urinary tract infection, site not specified: Secondary | ICD-10-CM | POA: Diagnosis not present

## 2017-10-31 DIAGNOSIS — D649 Anemia, unspecified: Secondary | ICD-10-CM | POA: Diagnosis not present

## 2017-10-31 DIAGNOSIS — E538 Deficiency of other specified B group vitamins: Secondary | ICD-10-CM | POA: Diagnosis not present

## 2017-10-31 DIAGNOSIS — R2689 Other abnormalities of gait and mobility: Secondary | ICD-10-CM | POA: Diagnosis not present

## 2017-10-31 DIAGNOSIS — Z743 Need for continuous supervision: Secondary | ICD-10-CM | POA: Diagnosis not present

## 2017-10-31 DIAGNOSIS — Z79899 Other long term (current) drug therapy: Secondary | ICD-10-CM | POA: Diagnosis not present

## 2017-10-31 DIAGNOSIS — M4800 Spinal stenosis, site unspecified: Secondary | ICD-10-CM | POA: Diagnosis not present

## 2017-10-31 DIAGNOSIS — I10 Essential (primary) hypertension: Secondary | ICD-10-CM | POA: Diagnosis not present

## 2017-10-31 DIAGNOSIS — R279 Unspecified lack of coordination: Secondary | ICD-10-CM | POA: Diagnosis not present

## 2017-10-31 DIAGNOSIS — R2681 Unsteadiness on feet: Secondary | ICD-10-CM | POA: Diagnosis not present

## 2017-10-31 DIAGNOSIS — E039 Hypothyroidism, unspecified: Secondary | ICD-10-CM | POA: Diagnosis not present

## 2017-10-31 DIAGNOSIS — I680 Cerebral amyloid angiopathy: Secondary | ICD-10-CM | POA: Diagnosis not present

## 2017-10-31 DIAGNOSIS — N189 Chronic kidney disease, unspecified: Secondary | ICD-10-CM | POA: Diagnosis not present

## 2017-10-31 DIAGNOSIS — Z87898 Personal history of other specified conditions: Secondary | ICD-10-CM | POA: Diagnosis not present

## 2017-10-31 DIAGNOSIS — E539 Vitamin B deficiency, unspecified: Secondary | ICD-10-CM | POA: Diagnosis not present

## 2017-10-31 DIAGNOSIS — I69320 Aphasia following cerebral infarction: Secondary | ICD-10-CM | POA: Diagnosis not present

## 2017-10-31 DIAGNOSIS — I69398 Other sequelae of cerebral infarction: Secondary | ICD-10-CM | POA: Diagnosis not present

## 2017-10-31 DIAGNOSIS — R569 Unspecified convulsions: Secondary | ICD-10-CM | POA: Diagnosis not present

## 2017-10-31 DIAGNOSIS — R41841 Cognitive communication deficit: Secondary | ICD-10-CM | POA: Diagnosis not present

## 2017-10-31 DIAGNOSIS — I69120 Aphasia following nontraumatic intracerebral hemorrhage: Secondary | ICD-10-CM | POA: Diagnosis not present

## 2017-10-31 DIAGNOSIS — M6281 Muscle weakness (generalized): Secondary | ICD-10-CM | POA: Diagnosis not present

## 2017-10-31 DIAGNOSIS — G40909 Epilepsy, unspecified, not intractable, without status epilepticus: Secondary | ICD-10-CM | POA: Diagnosis not present

## 2017-10-31 DIAGNOSIS — R4701 Aphasia: Secondary | ICD-10-CM | POA: Diagnosis not present

## 2017-10-31 DIAGNOSIS — Z8673 Personal history of transient ischemic attack (TIA), and cerebral infarction without residual deficits: Secondary | ICD-10-CM | POA: Diagnosis not present

## 2017-10-31 MED ORDER — FERROUS SULFATE 325 (65 FE) MG PO TABS
325.00 | ORAL_TABLET | ORAL | Status: DC
Start: 2017-11-01 — End: 2017-10-31

## 2017-10-31 MED ORDER — MELATONIN 3 MG PO TABS
6.00 | ORAL_TABLET | ORAL | Status: DC
Start: ? — End: 2017-10-31

## 2017-10-31 MED ORDER — HYDRALAZINE HCL 20 MG/ML IJ SOLN
10.00 | INTRAMUSCULAR | Status: DC
Start: ? — End: 2017-10-31

## 2017-10-31 MED ORDER — ACETAMINOPHEN 325 MG PO TABS
650.00 | ORAL_TABLET | ORAL | Status: DC
Start: ? — End: 2017-10-31

## 2017-10-31 MED ORDER — PANTOPRAZOLE SODIUM 40 MG PO TBEC
40.00 | DELAYED_RELEASE_TABLET | ORAL | Status: DC
Start: 2017-11-01 — End: 2017-10-31

## 2017-10-31 MED ORDER — LEVETIRACETAM 500 MG PO TABS
2000.00 | ORAL_TABLET | ORAL | Status: DC
Start: 2017-10-31 — End: 2017-10-31

## 2017-10-31 MED ORDER — LEVOTHYROXINE SODIUM 100 MCG PO TABS
100.00 | ORAL_TABLET | ORAL | Status: DC
Start: 2017-11-01 — End: 2017-10-31

## 2017-10-31 MED ORDER — GENERIC EXTERNAL MEDICATION
1.00 | Status: DC
Start: 2017-11-01 — End: 2017-10-31

## 2017-10-31 MED ORDER — LIDOCAINE 5 % EX PTCH
1.00 | MEDICATED_PATCH | CUTANEOUS | Status: DC
Start: 2017-11-01 — End: 2017-10-31

## 2017-10-31 MED ORDER — SENNOSIDES-DOCUSATE SODIUM 8.6-50 MG PO TABS
1.00 | ORAL_TABLET | ORAL | Status: DC
Start: 2017-10-31 — End: 2017-10-31

## 2017-10-31 MED ORDER — HEPARIN SODIUM (PORCINE) 5000 UNIT/ML IJ SOLN
5000.00 | INTRAMUSCULAR | Status: DC
Start: 2017-10-31 — End: 2017-10-31

## 2017-10-31 MED ORDER — LACOSAMIDE 100 MG PO TABS
100.00 | ORAL_TABLET | ORAL | Status: DC
Start: 2017-10-31 — End: 2017-10-31

## 2017-10-31 MED ORDER — CALCIUM CARBONATE 1250 (500 CA) MG PO TABS
1250.00 | ORAL_TABLET | ORAL | Status: DC
Start: 2017-11-01 — End: 2017-10-31

## 2017-10-31 MED ORDER — BISACODYL 10 MG RE SUPP
10.00 | RECTAL | Status: DC
Start: ? — End: 2017-10-31

## 2017-10-31 MED ORDER — MIRTAZAPINE 15 MG PO TABS
15.00 | ORAL_TABLET | ORAL | Status: DC
Start: 2017-10-31 — End: 2017-10-31

## 2017-10-31 MED ORDER — GABAPENTIN 100 MG PO CAPS
200.00 | ORAL_CAPSULE | ORAL | Status: DC
Start: 2017-10-31 — End: 2017-10-31

## 2017-10-31 MED ORDER — LOSARTAN POTASSIUM 50 MG PO TABS
100.00 | ORAL_TABLET | ORAL | Status: DC
Start: 2017-11-01 — End: 2017-10-31

## 2017-10-31 MED ORDER — PHENYTOIN SODIUM EXTENDED 100 MG PO CAPS
200.00 | ORAL_CAPSULE | ORAL | Status: DC
Start: 2017-10-31 — End: 2017-10-31

## 2017-10-31 MED ORDER — VITAMIN B-12 1000 MCG PO TABS
1000.00 | ORAL_TABLET | ORAL | Status: DC
Start: 2017-11-01 — End: 2017-10-31

## 2017-10-31 MED ORDER — MAGNESIUM OXIDE 400 MG PO TABS
400.00 | ORAL_TABLET | ORAL | Status: DC
Start: 2017-10-31 — End: 2017-10-31

## 2017-10-31 MED ORDER — POLYETHYLENE GLYCOL 3350 17 G PO PACK
17.00 g | PACK | ORAL | Status: DC
Start: 2017-11-01 — End: 2017-10-31

## 2017-11-03 DIAGNOSIS — Z8673 Personal history of transient ischemic attack (TIA), and cerebral infarction without residual deficits: Secondary | ICD-10-CM | POA: Diagnosis not present

## 2017-11-03 DIAGNOSIS — Z87898 Personal history of other specified conditions: Secondary | ICD-10-CM | POA: Diagnosis not present

## 2017-11-03 DIAGNOSIS — I10 Essential (primary) hypertension: Secondary | ICD-10-CM | POA: Diagnosis not present

## 2017-11-03 DIAGNOSIS — R4701 Aphasia: Secondary | ICD-10-CM | POA: Diagnosis not present

## 2017-11-07 DIAGNOSIS — I69951 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: Secondary | ICD-10-CM | POA: Diagnosis not present

## 2017-11-07 DIAGNOSIS — Z87898 Personal history of other specified conditions: Secondary | ICD-10-CM | POA: Diagnosis not present

## 2017-11-07 DIAGNOSIS — I69120 Aphasia following nontraumatic intracerebral hemorrhage: Secondary | ICD-10-CM | POA: Diagnosis not present

## 2017-11-07 DIAGNOSIS — Z8673 Personal history of transient ischemic attack (TIA), and cerebral infarction without residual deficits: Secondary | ICD-10-CM | POA: Diagnosis not present

## 2017-11-17 DIAGNOSIS — G40909 Epilepsy, unspecified, not intractable, without status epilepticus: Secondary | ICD-10-CM | POA: Diagnosis not present

## 2017-11-17 DIAGNOSIS — I10 Essential (primary) hypertension: Secondary | ICD-10-CM | POA: Diagnosis not present

## 2017-11-17 DIAGNOSIS — N189 Chronic kidney disease, unspecified: Secondary | ICD-10-CM | POA: Diagnosis not present

## 2017-11-17 DIAGNOSIS — E538 Deficiency of other specified B group vitamins: Secondary | ICD-10-CM | POA: Diagnosis not present

## 2017-11-20 NOTE — Telephone Encounter (Signed)
[name] brought the Insurance form back into the office with a new blank duplicate on Wed, 11/19/17, as the Home Health needed for ADLs was filled out incorrectly. Pt's daughter was informed that PCP was not in office and would RTO on Thurs, 11/20/17 and that we would call her when paperwork was corrected and completed. Called daughter and informed today paperwork completed and ready for pick-up during regular business hours; understood & agreed/SLS 04/25

## 2017-11-21 DIAGNOSIS — I10 Essential (primary) hypertension: Secondary | ICD-10-CM | POA: Diagnosis not present

## 2017-11-21 DIAGNOSIS — I69951 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: Secondary | ICD-10-CM | POA: Diagnosis not present

## 2017-11-21 DIAGNOSIS — R4701 Aphasia: Secondary | ICD-10-CM | POA: Diagnosis not present

## 2017-11-21 DIAGNOSIS — Z87898 Personal history of other specified conditions: Secondary | ICD-10-CM | POA: Diagnosis not present

## 2017-11-24 ENCOUNTER — Telehealth: Payer: Self-pay | Admitting: *Deleted

## 2017-11-24 NOTE — Telephone Encounter (Signed)
Received Physician Orders from [facility]; forwarded to provider/SLS 04/29

## 2017-11-25 ENCOUNTER — Other Ambulatory Visit: Payer: Self-pay

## 2017-11-25 NOTE — Patient Outreach (Signed)
[facility]) Care Management  11/25/2017  Kathleen Jensen 31-Jul-1950 [id number]  Transition of care  Referral date: 11/24/17 Referral source: Discharged from [facility] on 11/22/17 Insurance: [employer]  Telephone call to patient regarding  Transition of care referral.  Unable to reach patient. HIPAA compliant voice message left with call back phone number.  PLAN; RNCM will attempt 2nd telephone call to patient within  4 business days .  RNCM will send outreach letter to attempt  Contact.   George Ina RN,BSN,CCM [facility] Telephonic  [phone]

## 2017-11-26 NOTE — Patient Outreach (Signed)
[facility]) Care Management  11/26/2017  Kathleen Jensen 17-Mar-1951 [id number]  Late entry for 11/25/17 Transition of care  Referral date: 11/24/17 Referral source: discharged from [facility] on 11/22/17 Insurance: [employer]  Telephone call to patient regarding transition of care referral. Unable to reach patient. HIPAA compliant voice message left with call back phone number.   PLAN:  RNCM will attempt 2nd telephone call to patient within 4 business days.  RNCM will send outreach letter to attempt contact.   George Ina RN,BSN,CCM [facility] Telephonic  [phone]

## 2017-11-27 ENCOUNTER — Telehealth: Payer: Self-pay | Admitting: *Deleted

## 2017-11-27 ENCOUNTER — Ambulatory Visit: Payer: Medicare HMO

## 2017-11-27 DIAGNOSIS — N183 Chronic kidney disease, stage 3 (moderate): Secondary | ICD-10-CM | POA: Diagnosis not present

## 2017-11-27 DIAGNOSIS — R569 Unspecified convulsions: Secondary | ICD-10-CM | POA: Diagnosis not present

## 2017-11-27 DIAGNOSIS — M47816 Spondylosis without myelopathy or radiculopathy, lumbar region: Secondary | ICD-10-CM | POA: Diagnosis not present

## 2017-11-27 DIAGNOSIS — I129 Hypertensive chronic kidney disease with stage 1 through stage 4 chronic kidney disease, or unspecified chronic kidney disease: Secondary | ICD-10-CM | POA: Diagnosis not present

## 2017-11-27 DIAGNOSIS — I69351 Hemiplegia and hemiparesis following cerebral infarction affecting right dominant side: Secondary | ICD-10-CM | POA: Diagnosis not present

## 2017-11-27 DIAGNOSIS — I69320 Aphasia following cerebral infarction: Secondary | ICD-10-CM | POA: Diagnosis not present

## 2017-11-27 DIAGNOSIS — E1122 Type 2 diabetes mellitus with diabetic chronic kidney disease: Secondary | ICD-10-CM | POA: Diagnosis not present

## 2017-11-27 DIAGNOSIS — I69398 Other sequelae of cerebral infarction: Secondary | ICD-10-CM | POA: Diagnosis not present

## 2017-11-27 DIAGNOSIS — J45909 Unspecified asthma, uncomplicated: Secondary | ICD-10-CM | POA: Diagnosis not present

## 2017-11-27 NOTE — Telephone Encounter (Signed)
Copied from CRM [id number]. Topic: General - Other >> Nov 26, 2017  5:22 PM Raquel Sarna wrote: Pt returned missed call after hours. Please call pt back at below:  [phone] - home [phone] - Daughter Parke Poisson

## 2017-11-28 DIAGNOSIS — R569 Unspecified convulsions: Secondary | ICD-10-CM | POA: Diagnosis not present

## 2017-11-28 DIAGNOSIS — I69398 Other sequelae of cerebral infarction: Secondary | ICD-10-CM | POA: Diagnosis not present

## 2017-11-28 DIAGNOSIS — M47816 Spondylosis without myelopathy or radiculopathy, lumbar region: Secondary | ICD-10-CM | POA: Diagnosis not present

## 2017-11-28 DIAGNOSIS — E1122 Type 2 diabetes mellitus with diabetic chronic kidney disease: Secondary | ICD-10-CM | POA: Diagnosis not present

## 2017-11-28 DIAGNOSIS — I69320 Aphasia following cerebral infarction: Secondary | ICD-10-CM | POA: Diagnosis not present

## 2017-11-28 DIAGNOSIS — N183 Chronic kidney disease, stage 3 (moderate): Secondary | ICD-10-CM | POA: Diagnosis not present

## 2017-11-28 DIAGNOSIS — I69351 Hemiplegia and hemiparesis following cerebral infarction affecting right dominant side: Secondary | ICD-10-CM | POA: Diagnosis not present

## 2017-11-28 DIAGNOSIS — J45909 Unspecified asthma, uncomplicated: Secondary | ICD-10-CM | POA: Diagnosis not present

## 2017-11-28 DIAGNOSIS — I129 Hypertensive chronic kidney disease with stage 1 through stage 4 chronic kidney disease, or unspecified chronic kidney disease: Secondary | ICD-10-CM | POA: Diagnosis not present

## 2017-11-28 NOTE — Telephone Encounter (Signed)
Advised patient that we did not call her.

## 2017-12-01 ENCOUNTER — Telehealth: Payer: Self-pay | Admitting: Family Medicine

## 2017-12-01 ENCOUNTER — Other Ambulatory Visit: Payer: Self-pay

## 2017-12-01 DIAGNOSIS — N183 Chronic kidney disease, stage 3 (moderate): Secondary | ICD-10-CM | POA: Diagnosis not present

## 2017-12-01 DIAGNOSIS — R569 Unspecified convulsions: Secondary | ICD-10-CM | POA: Diagnosis not present

## 2017-12-01 DIAGNOSIS — I69398 Other sequelae of cerebral infarction: Secondary | ICD-10-CM | POA: Diagnosis not present

## 2017-12-01 DIAGNOSIS — M47816 Spondylosis without myelopathy or radiculopathy, lumbar region: Secondary | ICD-10-CM | POA: Diagnosis not present

## 2017-12-01 DIAGNOSIS — I69351 Hemiplegia and hemiparesis following cerebral infarction affecting right dominant side: Secondary | ICD-10-CM | POA: Diagnosis not present

## 2017-12-01 DIAGNOSIS — J45909 Unspecified asthma, uncomplicated: Secondary | ICD-10-CM | POA: Diagnosis not present

## 2017-12-01 DIAGNOSIS — E1122 Type 2 diabetes mellitus with diabetic chronic kidney disease: Secondary | ICD-10-CM | POA: Diagnosis not present

## 2017-12-01 DIAGNOSIS — I129 Hypertensive chronic kidney disease with stage 1 through stage 4 chronic kidney disease, or unspecified chronic kidney disease: Secondary | ICD-10-CM | POA: Diagnosis not present

## 2017-12-01 DIAGNOSIS — I69320 Aphasia following cerebral infarction: Secondary | ICD-10-CM | POA: Diagnosis not present

## 2017-12-01 NOTE — Patient Outreach (Addendum)
[facility]) Care Management  12/01/2017  Kathleen Jensen 1950/11/04 [id number]   Late entry for 11/25/17 Transition of care  Referral date: 11/24/17 Referral source: discharged from [facility] on 11/22/17 Insurance: [employer]  Telephone call to patient's daughter and designated party release.  Daughter verified HIPAA for patient. Explained reason for call. Daughter states patient was previously in the hospital due to another seizure episode. States patient transferred to [facility] for rehabilitation. Daughter states patient requires total assistance with bathing and transfers. States patient has someone with her around the clock. Daughter states she has been staying with patient to assist with her care.   Daughter states patient has follow up appointments scheduled:  Follow up with primary MD on 12/25/17 Follow up with neurologist, Dr. Herma Carson. Ward on 12/10/17 Follow up for pacemaker on 12/03/17 Follow up eye appointment on 12/12/17 Daughter states [facility] home health is providing services for patient.  Daughter states patient's new medications were not called into the pharmacy at discharge. States she spoke with patients neurologist office who called in prescriptions for her.  Daughter states patient is currently on Vimpat. States she will be discussing this with patients neurologist to determine whether patient will continue on this medication. Daughter states patient will be unable to afford this medication if she has to continue on it.  RNCM reviewed medications with daughter. Daughter states she has been giving patient two prescriptions of the pantoprazole and did not realize it. Daughter states she will remove the old bottle of pantoprazole from patients daily medications.  Daughter states she would like to have assistance with an ADvance directive for patient.  RNCM discussed and offered [facility] care management services. Daughter verbally agreed to social worker follow up  for advance directive assistance. Daughter states she will discuss patients medication Vimpat with doctor to determine if patient has to continue taking.  If has to continue taking will call [facility] care management for medication assistance. Daughter denies need for nursing at this time due to patient being followed by [facility].  RNCM advised patient to notify MD of any changes in condition prior to scheduled appointment. RNCM provided contact name and number: [phone] or main office number [phone] and 24 hour nurse advise line [phone].  RNCM verified patient aware of 911 services for urgent/ emergent needs.   PLAN; RNCM will refer patient to social worker RNCM will send patient [facility] care management packet with consent/ advance directive packet.   George Ina RN,BSN,CCM [facility] Telephonic  [phone]

## 2017-12-01 NOTE — Telephone Encounter (Signed)
Copied from CRM #[id number]. Topic: Quick Communication - See Telephone Encounter >> Dec 01, 2017 12:52 PM Vivia Ewing A wrote: CRM for notification. See Telephone encounter for: 12/01/17.  Beth from [facility] calling to get Speech Therapy orders.   Please return call at [phone] (confidential VM)

## 2017-12-02 ENCOUNTER — Encounter: Payer: Self-pay | Admitting: *Deleted

## 2017-12-02 ENCOUNTER — Ambulatory Visit: Payer: Self-pay

## 2017-12-02 ENCOUNTER — Other Ambulatory Visit: Payer: Self-pay | Admitting: *Deleted

## 2017-12-02 DIAGNOSIS — G40909 Epilepsy, unspecified, not intractable, without status epilepticus: Secondary | ICD-10-CM | POA: Diagnosis not present

## 2017-12-02 DIAGNOSIS — E538 Deficiency of other specified B group vitamins: Secondary | ICD-10-CM | POA: Diagnosis not present

## 2017-12-02 DIAGNOSIS — M47816 Spondylosis without myelopathy or radiculopathy, lumbar region: Secondary | ICD-10-CM | POA: Diagnosis not present

## 2017-12-02 DIAGNOSIS — N189 Chronic kidney disease, unspecified: Secondary | ICD-10-CM | POA: Diagnosis not present

## 2017-12-02 DIAGNOSIS — N183 Chronic kidney disease, stage 3 (moderate): Secondary | ICD-10-CM | POA: Diagnosis not present

## 2017-12-02 DIAGNOSIS — M4800 Spinal stenosis, site unspecified: Secondary | ICD-10-CM | POA: Diagnosis not present

## 2017-12-02 DIAGNOSIS — J45909 Unspecified asthma, uncomplicated: Secondary | ICD-10-CM | POA: Diagnosis not present

## 2017-12-02 DIAGNOSIS — I69351 Hemiplegia and hemiparesis following cerebral infarction affecting right dominant side: Secondary | ICD-10-CM | POA: Diagnosis not present

## 2017-12-02 DIAGNOSIS — R569 Unspecified convulsions: Secondary | ICD-10-CM | POA: Diagnosis not present

## 2017-12-02 DIAGNOSIS — I69320 Aphasia following cerebral infarction: Secondary | ICD-10-CM | POA: Diagnosis not present

## 2017-12-02 DIAGNOSIS — I129 Hypertensive chronic kidney disease with stage 1 through stage 4 chronic kidney disease, or unspecified chronic kidney disease: Secondary | ICD-10-CM | POA: Diagnosis not present

## 2017-12-02 DIAGNOSIS — E1122 Type 2 diabetes mellitus with diabetic chronic kidney disease: Secondary | ICD-10-CM | POA: Diagnosis not present

## 2017-12-02 DIAGNOSIS — I69398 Other sequelae of cerebral infarction: Secondary | ICD-10-CM | POA: Diagnosis not present

## 2017-12-02 NOTE — Patient Outreach (Signed)
[facility]) Care Management  12/02/2017  Kathleen Jensen 1951/01/07 [id number]  CSW was able to make initial contact with patient and patient's daughter/caregiver, Kathleen Jensen today to perform the phone assessment, as well as assess and assist with social work needs and services.  CSW introduced self, explained role and types of services provided through [employer] Care Management [facility] Care Management).  CSW further explained to patient that CSW works with patient's Telephonic RNCM, also with [facility] Care Management, George Ina. CSW then explained the reason for the call, indicating that Kathleen Jensen thought that patient would benefit from social work services and resources to assist with completion of Advanced Directives (Living Will and [employer] of [place] documents).  CSW obtained two HIPAA compliant identifiers from patient, which included patient's name and date of birth. Patient and Kathleen Jensen admit that they are ready to complete patient's Advanced Directives, making Kathleen Jensen her Midwife.  CSW agreed to meet patient and Kathleen Jensen for an initial home visit to assist with completion of the documents.  The home visit has been scheduled for Friday, Dec 05, 2017 at 10:00AM, per Kathleen Jensen's request.  No additional social work needs have been identified at this time. [facility] CM Care Plan Problem One     Most Recent Value  Care Plan Problem One  No Advanced Directives in place.  Role Documenting the Problem One  Clinical Social Worker  Care Plan for Problem One  Active  [facility] Long Term Goal   Patient will have Advanced Directives (Living Will and HealthCare Power of Attorney documents) signed in front of witnesses and notarized from a representative within the [facility] system, within the next 35 days.  [facility] Long Term Goal Start Date  12/02/17  Interventions for Problem One Long Term Goal  Patient has been given a list of [facility] affiliated facilities that will notarize her Advanced Directives, free of charge, as well as provide copies.  [facility] CM Short Term Goal #1   Patient and daughter will meet with CSW for an initial home visit to review an Advanced Directives packet, including Living Will and HealthCare Power of [place] documents, within the next week.  [facility] CM Short Term Goal #1 Start Date  12/02/17  Interventions for Short Term Goal #1  CSW will provide patient and patient's daughter with an Advanced Directives packet.  [facility] CM Short Term Goal #2   Patient will receive assistance with completion of her Advanced Directives (Living Will and Healthcare Power of Attorney documents) by CSW, within the next week.  [facility] CM Short Term Goal #2 Start Date  12/02/17  Interventions for Short Term Goal #2  CSW will assist patient with completion of the Living Will and HealthCare Power of [place] documents, explaining the process for having the documents signed and notarized in front of two witnesses.     Danford Bad, BSW, MSW, LCSW  Licensed Restaurant manager, fast food [facility] [address], [place], [place] [address] Physical Address-[address], [place], [place] [address] Toll Free Main # [phone] Fax # [phone] Cell # [phone]  Office # [phone] Mardene [email or web address]

## 2017-12-02 NOTE — Telephone Encounter (Signed)
Patient's sister Parke Poisson called, left VM to return call back to discuss the seizure medication sequence.

## 2017-12-02 NOTE — Telephone Encounter (Signed)
Pt's daughter Santiago Bumpers called and states that the other daughter helped with the pt's medication today and got the pt off of her  sequence on her seizure medication, medication lacosamide (VIMPAT) 200 MG TABS tablet .Pt was given pill #30 instead of #32. Pt is off sequence on taking the medication, and daughter is asking on how to get he back on schedule.  Pt daughter states that the pt's current pill packaging is set up to go down from the number 60 until it gets to 1. Pt is currently taking Vimpat twice a day and the pills are sorted to take in the am and pm.Pt's daughter advised to continue on with sequence and to add the missing pill at the end. Understanding verbalized.  Pt's daughter also stating that her husband notified her around 4pm today that when he took the pt outside after OT, the pts "speech was thrown off". Pt's daughter states that the husband reported that the pt was saying words that didn't make sense and this lasted between 30 min to an hour. Pt's daughter states she arrived home at approximately 4:30pm and noted that the pt was talking slowing but was returning to her baseline but noted that she had saliva to build up in the corner of her mouth.Per daughter pt has a history of a stroke in November and seizures in Jan and March and was told that per EEG pt had done that the pt was having seizure activity that did not show up physically.  Pt's daughter states she is not at home with the pt at this time and the pt is currently at a neighbor's house. Pt's daughter advised to take pt to ED if symptoms continued once she arrived back home. Pt's daughter states once she gets home she will check to see if her mother has improved and if she has she will wait to see how she does in the morning.Explained to the pt's dughter that if symptoms did not improve or new symptoms developed she needed to take her mother to the ED. Understanding verbalized.  Reason for Disposition . [1] Caller is not with the  adult (patient) AND [2] probable NON-URGENT symptoms  Protocols used: INFORMATION ONLY CALL-A-AH

## 2017-12-02 NOTE — Telephone Encounter (Signed)
Beth from [facility] calling to inquire about verbal orders. Please advise. CB#: [phone]

## 2017-12-03 DIAGNOSIS — M4800 Spinal stenosis, site unspecified: Secondary | ICD-10-CM | POA: Diagnosis not present

## 2017-12-03 DIAGNOSIS — I69398 Other sequelae of cerebral infarction: Secondary | ICD-10-CM | POA: Diagnosis not present

## 2017-12-03 DIAGNOSIS — R6889 Other general symptoms and signs: Secondary | ICD-10-CM | POA: Diagnosis not present

## 2017-12-03 DIAGNOSIS — R569 Unspecified convulsions: Secondary | ICD-10-CM | POA: Diagnosis not present

## 2017-12-03 NOTE — Telephone Encounter (Signed)
FYI to PCP

## 2017-12-03 NOTE — Telephone Encounter (Signed)
They should call the neurologist if there are questions about the seizure med Please check on pt later today

## 2017-12-03 NOTE — Telephone Encounter (Signed)
Verbal orders given to Beth. 

## 2017-12-04 DIAGNOSIS — I69320 Aphasia following cerebral infarction: Secondary | ICD-10-CM | POA: Diagnosis not present

## 2017-12-04 DIAGNOSIS — J45909 Unspecified asthma, uncomplicated: Secondary | ICD-10-CM | POA: Diagnosis not present

## 2017-12-04 DIAGNOSIS — R402441 Other coma, without documented Glasgow coma scale score, or with partial score reported, in the field [EMT or ambulance]: Secondary | ICD-10-CM | POA: Diagnosis not present

## 2017-12-04 DIAGNOSIS — E1122 Type 2 diabetes mellitus with diabetic chronic kidney disease: Secondary | ICD-10-CM | POA: Diagnosis not present

## 2017-12-04 DIAGNOSIS — I129 Hypertensive chronic kidney disease with stage 1 through stage 4 chronic kidney disease, or unspecified chronic kidney disease: Secondary | ICD-10-CM | POA: Diagnosis not present

## 2017-12-04 DIAGNOSIS — I69351 Hemiplegia and hemiparesis following cerebral infarction affecting right dominant side: Secondary | ICD-10-CM | POA: Diagnosis not present

## 2017-12-04 DIAGNOSIS — R4701 Aphasia: Secondary | ICD-10-CM | POA: Diagnosis not present

## 2017-12-04 DIAGNOSIS — N183 Chronic kidney disease, stage 3 (moderate): Secondary | ICD-10-CM | POA: Diagnosis not present

## 2017-12-04 DIAGNOSIS — R569 Unspecified convulsions: Secondary | ICD-10-CM | POA: Diagnosis not present

## 2017-12-04 DIAGNOSIS — R935 Abnormal findings on diagnostic imaging of other abdominal regions, including retroperitoneum: Secondary | ICD-10-CM | POA: Diagnosis not present

## 2017-12-04 DIAGNOSIS — I2699 Other pulmonary embolism without acute cor pulmonale: Secondary | ICD-10-CM | POA: Diagnosis not present

## 2017-12-04 DIAGNOSIS — I69398 Other sequelae of cerebral infarction: Secondary | ICD-10-CM | POA: Diagnosis not present

## 2017-12-04 DIAGNOSIS — M47816 Spondylosis without myelopathy or radiculopathy, lumbar region: Secondary | ICD-10-CM | POA: Diagnosis not present

## 2017-12-04 DIAGNOSIS — E119 Type 2 diabetes mellitus without complications: Secondary | ICD-10-CM | POA: Diagnosis not present

## 2017-12-04 NOTE — Telephone Encounter (Signed)
Noted  

## 2017-12-04 NOTE — Telephone Encounter (Signed)
Spoke with daughter advised that she called neurologist about seizure meds.  She states that pt has an appt with neuro on 5/15.  She is still showing aphasia, speech therapy is working with her.  She can talk but she is now using words from a previous conversation.    Also her blood pressure readings has been elevated.  11/28/17 110/95, 12/01/17 156/100, earlier this morning by the speech therapist 176/92, and 11:40 by PT while I was on the phone was 193/123 with a digital machine.

## 2017-12-04 NOTE — Telephone Encounter (Signed)
[name] called back to report blood patient reading by home health is 182/101 and 208/128. She called EMS and will request for her mom to be taken to [facility].

## 2017-12-05 ENCOUNTER — Other Ambulatory Visit: Payer: Self-pay | Admitting: *Deleted

## 2017-12-05 ENCOUNTER — Other Ambulatory Visit: Payer: Self-pay

## 2017-12-05 DIAGNOSIS — E1122 Type 2 diabetes mellitus with diabetic chronic kidney disease: Secondary | ICD-10-CM | POA: Diagnosis not present

## 2017-12-05 DIAGNOSIS — R569 Unspecified convulsions: Secondary | ICD-10-CM | POA: Diagnosis not present

## 2017-12-05 DIAGNOSIS — I69351 Hemiplegia and hemiparesis following cerebral infarction affecting right dominant side: Secondary | ICD-10-CM | POA: Diagnosis not present

## 2017-12-05 DIAGNOSIS — I69398 Other sequelae of cerebral infarction: Secondary | ICD-10-CM | POA: Diagnosis not present

## 2017-12-05 DIAGNOSIS — J45909 Unspecified asthma, uncomplicated: Secondary | ICD-10-CM | POA: Diagnosis not present

## 2017-12-05 DIAGNOSIS — I129 Hypertensive chronic kidney disease with stage 1 through stage 4 chronic kidney disease, or unspecified chronic kidney disease: Secondary | ICD-10-CM | POA: Diagnosis not present

## 2017-12-05 DIAGNOSIS — M47816 Spondylosis without myelopathy or radiculopathy, lumbar region: Secondary | ICD-10-CM | POA: Diagnosis not present

## 2017-12-05 DIAGNOSIS — I69320 Aphasia following cerebral infarction: Secondary | ICD-10-CM | POA: Diagnosis not present

## 2017-12-05 DIAGNOSIS — N183 Chronic kidney disease, stage 3 (moderate): Secondary | ICD-10-CM | POA: Diagnosis not present

## 2017-12-05 NOTE — Patient Outreach (Signed)
[facility]) Care Management  12/05/2017  Albertine Lafoy 01-09-1951 [id number]   Care coordination: Received voice mail message from patients daughter, Marissa Nestle requesting social worker contact for transportation assistance from patient and  Notification that patient was taken to the emergency department on 12/04/17 due to elevated blood pressure.  RNCM left voice message for  social worker, Nat Christen to inform her of daughters request for transportation assistance for patient. Contact phone number for daughter left with message.   RNCM notified daughter that message has been left for Nat Christen to call her regarding transportation assistance for patient.  Daughter states she confirmed with [employer] that patient only has 2 one way trips or 1 round way trip left.   Daughter reports she has set up all of patients appointments for this month with [employer]'s transportation assistance.  Would like to have and apply for additional transportation assistance for patient.  Daughter states patient had her pacemaker check on 12/03/17 and has an appointments scheduled for eye doctor and neurology. Daughter states she will set up patient to have her hearing checked due to patient complaining of not being able to hear very well.  Patient states Education officer, museum met with patient today. States due to patient having more aphasia Education officer, museum was unable to complete Advance directive with patient. Daughter states will have to re-visit this at another time.  Daughter states patient was found to have a blood clot in her left lung at the emergency room evaluation. Patient was discharged on lovenox injections.  Patient states [facility] home health nurse came to the home on yesterday evening and today to give and demonstrate how to give injection. Daughter states her aunt has volunteered to give patient injection. Daughter reports she is nervous about giving the injection. Daughter states she was given  reassurance by the home health nurse.  RNCM discussed additional follow up with case manager.  Daughter did not feel it was needed at this time.    PLAN; No additional follow up needed by [facility] at this time.   Quinn Plowman RN,BSN,CCM [place] Telephonic  [phone]

## 2017-12-05 NOTE — Patient Outreach (Signed)
[facility] ([facility]) Care Management  12/05/2017  Kathleen Jensen 04/22/1951 [id number]   CSW drove out to patient's home today to perform the initial home visit, as well as to try and assist patient with completion of her Advanced Directives (Living Will and HealthCare Power of Attorney documents), but patient was much too confused today.  Also present during the time of CSW's visit was patient's daughter, [name] Franklin and a representative with the [place] Department of Social Services, Adult Protective Services Division.  Patient reported that she was not aware of CSW's visit, nor did patient realize that she was even completing her Advanced Directives today.  Patient did not know what the documents were for or even why they needed to be completed, which was somewhat concerning to CSW.  CSW inquired as to whether or not anyone had spoken with patient previously about completing the documents, but patient denied.  CSW then asked if patient's family members are aware of what patient's wishes are, if patient is unable to make appropriate decisions for herself regarding end-of-life care.  Patient did not answer at first, just staring at CSW.  CSW then asked the question again and patient simply stated, "no".   CSW asked patient a series of questions, home address, name of the president, year, etc., which patient was not able to answer successfully.  CSW also tried to explain the Living Will and HealthCare Power of Attorney documents to patient, but patient was simply unable to follow.  CSW explained to patient and [name] that CSW would not be able to assist them with completion of the documents today.  Patient and [name] voiced understanding and patient appeared to be somewhat relieved.  CSW encouraged [name] to schedule a family meeting with all of patient's children to first discuss what patient's wishes are, in the event that patient is unable to make decisions for  herself.  Then, if patient and [name] still need assistance with completion of the documents than CSW will make arrangements to meet with them again on Friday, Dec 12, 2017 at 10:00AM.  [name] admitted that she would be in touch one day next week to either confirm or deny the appointment.  [name] is aware that CSW will not be able to assist with completion of the documents if patient is experiencing any symptoms of confusion.  CSW will await a call from [name], but block schedule for next Friday, Dec 12, 2017 at 10:00AM. [facility] CM Care Plan Problem One     Most Recent Value  Care Plan Problem One  No Advanced Directives in place.  Role Documenting the Problem One  Clinical Social Worker  Care Plan for Problem One  Active  [facility] Long Term Goal   Patient will have Advanced Directives (Living Will and HealthCare Power of Attorney documents) signed in front of witnesses and notarized from a representative within the [place] system, within the next 35 days.  [facility] Long Term Goal Start Date  12/02/17  [facility] CM Short Term Goal #1   Patient and daughter will meet with CSW for an initial home visit to review an Advanced Directives packet, including Living Will and HealthCare Power of Attorney documents, within the next week.  [facility] CM Short Term Goal #1 Start Date  12/02/17  [facility] CM Short Term Goal #1 Met Date  12/05/17  Interventions for Short Term Goal #1  CSW met with patient and daughter for the initial home visit,  however,   CSW was unable to assist with completion of the documents as patient was too confused today.  [facility] CM Short Term Goal #2   Patient will receive assistance with completion of her Advanced Directives (Living Will and Healthcare Power of Attorney documents) by CSW, within the next week.  [facility] CM Short Term Goal #2 Start Date  12/02/17     , BSW, MSW, LCSW  Licensed Clinical Social Worker  [facility] Care Management [place] [facility]   Mailing Address-[address], [place], [place] [address] Physical Address-[address], [place], [place] [address] Toll Free Main # [phone] Fax # [phone] Cell # [phone]  Office # [phone] [email or web address]         

## 2017-12-08 ENCOUNTER — Telehealth: Payer: Self-pay | Admitting: *Deleted

## 2017-12-08 ENCOUNTER — Encounter: Payer: Self-pay | Admitting: Family Medicine

## 2017-12-08 ENCOUNTER — Ambulatory Visit (INDEPENDENT_AMBULATORY_CARE_PROVIDER_SITE_OTHER): Payer: Medicare HMO | Admitting: Family Medicine

## 2017-12-08 VITALS — BP 166/93 | HR 61 | Temp 98.3°F | Resp 16 | Ht 64.17 in

## 2017-12-08 DIAGNOSIS — G40909 Epilepsy, unspecified, not intractable, without status epilepticus: Secondary | ICD-10-CM | POA: Diagnosis not present

## 2017-12-08 DIAGNOSIS — E039 Hypothyroidism, unspecified: Secondary | ICD-10-CM

## 2017-12-08 DIAGNOSIS — E1122 Type 2 diabetes mellitus with diabetic chronic kidney disease: Secondary | ICD-10-CM | POA: Diagnosis not present

## 2017-12-08 DIAGNOSIS — R7989 Other specified abnormal findings of blood chemistry: Secondary | ICD-10-CM

## 2017-12-08 DIAGNOSIS — I69320 Aphasia following cerebral infarction: Secondary | ICD-10-CM | POA: Diagnosis not present

## 2017-12-08 DIAGNOSIS — H9193 Unspecified hearing loss, bilateral: Secondary | ICD-10-CM | POA: Diagnosis not present

## 2017-12-08 DIAGNOSIS — I10 Essential (primary) hypertension: Secondary | ICD-10-CM | POA: Diagnosis not present

## 2017-12-08 DIAGNOSIS — I2699 Other pulmonary embolism without acute cor pulmonale: Secondary | ICD-10-CM | POA: Diagnosis not present

## 2017-12-08 DIAGNOSIS — I639 Cerebral infarction, unspecified: Secondary | ICD-10-CM | POA: Insufficient documentation

## 2017-12-08 DIAGNOSIS — D649 Anemia, unspecified: Secondary | ICD-10-CM | POA: Diagnosis not present

## 2017-12-08 DIAGNOSIS — R569 Unspecified convulsions: Secondary | ICD-10-CM

## 2017-12-08 DIAGNOSIS — I69351 Hemiplegia and hemiparesis following cerebral infarction affecting right dominant side: Secondary | ICD-10-CM | POA: Diagnosis not present

## 2017-12-08 DIAGNOSIS — R9431 Abnormal electrocardiogram [ECG] [EKG]: Secondary | ICD-10-CM | POA: Diagnosis not present

## 2017-12-08 DIAGNOSIS — I129 Hypertensive chronic kidney disease with stage 1 through stage 4 chronic kidney disease, or unspecified chronic kidney disease: Secondary | ICD-10-CM | POA: Diagnosis not present

## 2017-12-08 DIAGNOSIS — N183 Chronic kidney disease, stage 3 (moderate): Secondary | ICD-10-CM | POA: Diagnosis not present

## 2017-12-08 DIAGNOSIS — J45909 Unspecified asthma, uncomplicated: Secondary | ICD-10-CM | POA: Diagnosis not present

## 2017-12-08 DIAGNOSIS — I69398 Other sequelae of cerebral infarction: Secondary | ICD-10-CM | POA: Diagnosis not present

## 2017-12-08 DIAGNOSIS — M47816 Spondylosis without myelopathy or radiculopathy, lumbar region: Secondary | ICD-10-CM | POA: Diagnosis not present

## 2017-12-08 LAB — POCT INR: INR: 1.5

## 2017-12-08 MED ORDER — LOSARTAN POTASSIUM 100 MG PO TABS: 100.0000 mg | ORAL_TABLET | Freq: Every day | ORAL | 3 refills | Status: DC

## 2017-12-08 MED ORDER — ENOXAPARIN SODIUM 40 MG/0.4ML ~~LOC~~ SOLN: SUBCUTANEOUS | Status: DC

## 2017-12-08 MED ORDER — WARFARIN SODIUM 5 MG PO TABS: ORAL_TABLET | ORAL | 3 refills | Status: DC

## 2017-12-08 MED ORDER — LEVETIRACETAM 1000 MG PO TABS: ORAL_TABLET | ORAL | 2 refills | Status: DC

## 2017-12-08 NOTE — Patient Instructions (Signed)
Pulmonary Embolism A pulmonary embolism (PE) is a sudden blockage or decrease of blood flow in one lung or both lungs. Most blockages come from a blood clot that forms in a lower leg, thigh, or arm vein (deep vein thrombosis, DVT) and travels to the lungs. A clot is blood that has thickened into a gel or solid. PE is a dangerous and life-threatening condition that needs to be treated right away. What are the causes? This condition is usually caused by a blood clot that forms in a vein and moves to the lungs. In rare cases, it may be caused by air, fat, part of a tumor, or other tissue that moves through the veins and into the lungs. What increases the risk? The following factors may make you more likely to develop this condition:  Having DVT or a history of DVT.  Being older than age 60.  Personal or family history of blood clots or blood clotting disease.  Major or lengthy surgery.  Orthopedic surgery, especially hip or knee replacement.  Traumatic injury, such as breaking a hip or leg.  Spinal cord injury.  Stroke.  Taking medicines that contain estrogen. These include birth control pills and hormone replacement therapy.  Long-term (chronic) lung or heart disease.  Cancer and chemotherapy.  Having a central venous catheter.  Pregnancy and the period after delivery.  What are the signs or symptoms? Symptoms of this condition usually start suddenly and include:  Shortness of breath while active or at rest.  Coughing or coughing up blood or blood-tinged mucus.  Chest pain that is often worse with deep breaths.  Rapid or irregular heartbeat.  Feeling light-headed or dizzy.  Fainting.  Feeling anxious.  Sweating.  Pain and swelling in a leg. This is a symptom of DVT, which can lead to PE.  How is this diagnosed? This condition may be diagnosed based on:  Your medical history.  A physical exam.  Blood tests to check blood oxygen level and how well your blood  clots, and a D-dimer blood test, which checks your blood for a substance that is released when a blood clot breaks apart.  CT pulmonary angiogram. This test checks blood flow in and around your lungs.  Ventilation-perfusion scan, also called a lung VQ scan. This test measures air flow and blood flow to the lungs.  Ultrasound of the legs to look for blood clots.  How is this treated? Treatment for this conditions depends on many factors, such as the cause of your PE, your risk for bleeding or developing more clots, and other medical conditions you have. Treatment aims to remove, dissolve, or stop blood clots from forming or growing larger. Treatment may include:  Blood thinning medicines (anticoagulants) to stop clots from forming or growing. These medicines may be given as a pill, as an injection, or through an IV tube (infusion).  Medicines that dissolve clots (thrombolytics).  A procedure in which a flexible tube is used to remove a blood clot (embolectomy) or deliver medicine to destroy it (catheter-directed thrombolysis).  A procedure in which a filter is inserted into a large vein that carries blood to the heart (inferior vena cava). This filter (vena cava filter) catches blood clots before they reach the lungs.  Surgery to remove the clot (surgical embolectomy). This is rare.  You may need a combination of immediate, long-term (up to 3 months after diagnosis), and extended (more than 3 months after diagnosis) treatments. Your treatment may continue for several months (maintenance therapy).   You and your health care provider will work together to choose the treatment program that is best for you. Follow these instructions at home: If you are taking an anticoagulant medicine:  Take the medicine every day at the same time each day.  Understand what foods and drugs interact with your medicine.  Understand the side effects of this medicine, including excessive bruising or bleeding. Ask  your health care provider or pharmacist about other side effects. General instructions  Take over-the-counter and prescription medicines only as told by your health care provider.  Anticoagulant medicines may cause side effects, including easy bruising and difficulty stopping bleeding. If you are prescribed an anticoagulant: ? Hold pressure over cuts for longer than usual. ? Tell your dentist and other health care providers that you are taking anticoagulants before you have any procedure that may cause bleeding. ? Avoid contact sports. ? Be extra careful when handling sharp objects. ? Use a soft toothbrush. Floss with waxed dental floss. ? Shave with an electric razor.  Wear a medical alert bracelet or carry a medical alert card that says you have had a PE.  Ask your health care provider when you may return to your normal activities.  Talk with your health care provider about any travel plans. It is important to make sure that you are still able to take your medicine while on trips.  Keep all follow-up visits as told by your health care provider. This is important. How is this prevented? Take these actions to lower your risk of developing another PE:  Exercise regularly. Take frequent walks. For at least 30 minutes every day, engage in: ? Activity that involves moving your arms and legs. ? Activity that encourages good blood flow through your body by increasing your heart rate.  While traveling, drink plenty of water and avoid drinking alcohol. Ask your health care provider if you should wear below-the-knee compression stockings.  Avoid sitting or lying in bed for long periods of time without moving your legs. Exercise your arms and legs every hour during long-distance travel (over 4 hours).  If you are hospitalized or have surgery, ask your health care provider about your risks and what treatments can help prevent blood clots.  Maintain a healthy weight. Ask your health care  provider what weight is healthy for you.  If you are a woman who is over age 35, avoid unnecessary use of medicines that contain estrogen, including birth control pills.  Do not use any products that contain nicotine or tobacco, such as cigarettes and e-cigarettes. This is especially important if you take estrogen medicines. If you need help quitting, ask your health care provider.  See your health care provider for regular checkups. This may include blood tests and ultrasound testing on your legs to check for new blood clots.  Contact a health care provider if:  You missed a dose of your blood thinner medicine. Get help right away if:  You have new or increased pain, swelling, warmth, or redness in an arm or leg.  You have numbness or tingling in an arm or leg.  You have shortness of breath while active or at rest.  You have chest pain.  You have a rapid or irregular heartbeat.  You feel light-headed or dizzy.  You cough up blood.  You have blood in your vomit, stool, or urine.  You have a fever.  You have abdomen (abdominal) pain.  You have a severe fall or head injury.  You have a   severe headache.  You have vision changes.  You cannot move your arms or legs.  You are confused or have memory loss.  You are bleeding for 10 minutes or more, even with strong pressure on the wound. These symptoms may represent a serious problem that is an emergency. Do not wait to see if the symptoms will go away. Get medical help right away. Call your local emergency services (911 in the [place] Do not drive yourself to the hospital. Summary  A pulmonary embolism (PE) is a sudden blockage or decrease of blood flow in one lung or both lungs. PE is a dangerous and life-threatening condition that needs to be treated right away.  Having deep vein thrombosis (DVT) or a history of DVT is the most common risk factor for PE.  Treatments for this condition usually include medicines to thin  your blood (anticoagulants) or medicines to break apart blood clots (thrombolytics).  If you are prescribed blood thinners, it is important to take the medicine every single day at the same time each day.  If you have signs of PE or DVT, call your local emergency services (911 in the [place] This information is not intended to replace advice given to you by your health care provider. Make sure you discuss any questions you have with your health care provider. Document Released: 07/12/2000 Document Revised: 08/17/2016 Document Reviewed: 08/17/2016 Elsevier Interactive Patient Education  2018 Elsevier Inc.  

## 2017-12-08 NOTE — Assessment & Plan Note (Signed)
Recheck today. 

## 2017-12-08 NOTE — Assessment & Plan Note (Signed)
Per neurology 

## 2017-12-08 NOTE — Telephone Encounter (Signed)
Spoke with Lanora Manis she stated the patient will see her again on next Wednesday. She stated if Dr. Laury Axon wants her to monitor her INR she will be more than happy to monitor it.  Call [place] back and let her know the verbal orders

## 2017-12-08 NOTE — Assessment & Plan Note (Signed)
Recheck labs today. 

## 2017-12-08 NOTE — Assessment & Plan Note (Signed)
keppra refilled  F/u  Neuro later this week

## 2017-12-08 NOTE — Telephone Encounter (Signed)
Copied from CRM [id number]. Topic: General - Other >> Dec 05, 2017 11:20 AM Percival Spanish wrote:  Kathleen Jensen with [facility] saw pt today and instructed the pt how to give lovenox injection. 90 mg twice a day for 7 days ,  she said they can do pt INR  and pt is is taking warfarin  every night .   [phone]

## 2017-12-08 NOTE — Progress Notes (Signed)
[name], female    DOB: 03/26/1951  Age: 67 y.o. MRN: [id number]    Subjective:  Subjective  HPI Kathleen Jensen presents for f/u from ER at [facility] fo PE.  Home health/ PT found bp to be elevated.  Pt denied cp, sob etc but we sent pt to er due to hx stroke and elevation in bp.  She had a ct brain / neck and pe was seen in apex of lungs.  Pt was put on coumadin and lovenox due to interaction of eliquis and xaralto with her seizure medication.  Pt is feeling much better.    Daughter is c/o dec hearing and pt is concerned that her ears have an impaction.   Review of Systems  Constitutional: Negative for appetite change, diaphoresis, fatigue and unexpected weight change.  HENT: Positive for hearing loss. Negative for congestion and ear pain.   Eyes: Negative for pain, redness and visual disturbance.  Respiratory: Negative for cough, chest tightness, shortness of breath and wheezing.   Cardiovascular: Negative for chest pain, palpitations and leg swelling.  Endocrine: Negative for cold intolerance, heat intolerance, polydipsia, polyphagia and polyuria.  Genitourinary: Negative for difficulty urinating, dysuria and frequency.  Neurological: Positive for weakness. Negative for dizziness, light-headedness, numbness and headaches.  Psychiatric/Behavioral: Negative.     History Past Medical History:  Diagnosis Date  . Arthritis   . 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 Hypertension     She has a past surgical history that includes Tonsillectomy; Gastric bypass (03/2007); Cervical spine surgery; Cesarean section; Breast biopsy (Right); Breast excisional biopsy; and Back surgery.   Her family history includes Cancer in her brother and sister; Diabetes in her father and mother; High blood pressure in her mother; Stroke in her father.She reports that she quit smoking about 38 years ago. She quit after 4.00 years of use. She has never used  smokeless tobacco. She reports that she does not drink alcohol or use drugs.  Current Outpatient Medications on File Prior to Visit  Medication Sig Dispense Refill  . amLODipine (NORVASC) 10 MG tablet TAKE 1 TABLET EVERY DAY 90 tablet 1  . aspirin 81 MG tablet Take 81 mg by mouth daily.    . calcium carbonate (OS-CAL - DOSED IN MG OF ELEMENTAL CALCIUM) 1250 (500 Ca) MG tablet 1 tablet daily.    . Cholecalciferol (VITAMIN D3) 1000 units CAPS Take by mouth.    . ferrous sulfate 325 (65 FE) MG tablet Take 1 tablet by mouth daily with breakfast.    . gabapentin (NEURONTIN) 100 MG capsule Take 100 mg by mouth 2 (two) times daily.    Marland Kitchen lacosamide (VIMPAT) 200 MG TABS tablet Take 1 tablet (200 mg total) by mouth 2 (two) times daily. 60 tablet   . levothyroxine (SYNTHROID, LEVOTHROID) 100 MCG tablet TAKE 1 TABLET EVERY DAY 90 tablet 0  . levothyroxine (SYNTHROID, LEVOTHROID) 88 MCG tablet Take 88 mcg by mouth daily before breakfast.    . LYRICA 50 MG capsule     . magnesium oxide (MAG-OX) 400 MG tablet Take 400 mg by mouth 2 (two) times daily.    . mirtazapine (REMERON) 15 MG tablet Take 1 tablet by mouth at bedtime.    . pantoprazole (PROTONIX) 40 MG tablet Take 40 mg by mouth daily.    . phenytoin (DILANTIN) 50 MG tablet Chew 2 tablets (100 mg total) by mouth 3 (three) times daily.    . polyethylene  glycol (MIRALAX / GLYCOLAX) packet Take 17 g by mouth daily as needed.     . senna-docusate (SENOKOT-S) 8.6-50 MG tablet Take 1 tablet by mouth 2 (two) times daily.    Marland Kitchen acetaminophen (TYLENOL) 325 MG tablet Take 1 tablet by mouth every 8 (eight) hours as needed.     No current facility-administered medications on file prior to visit.      Objective:  Objective  Physical Exam  Constitutional: She is oriented to person, place, and time. She appears well-developed and well-nourished.  HENT:  Head: Normocephalic and atraumatic.  Right Ear: External ear normal.  Left Ear: External ear normal.    Nose: Nose normal.  Mouth/Throat: Oropharynx is clear and moist.  Eyes: Conjunctivae and EOM are normal.  Neck: Normal range of motion. Neck supple. No JVD present. Carotid bruit is not present. No thyromegaly present.  Cardiovascular: Normal rate, regular rhythm and normal heart sounds.  No murmur heard. Pulmonary/Chest: Effort normal and breath sounds normal. No respiratory distress. She has no wheezes. She has no rales. She exhibits no tenderness.  Musculoskeletal: She exhibits no edema.  Neurological: She is alert and oriented to person, place, and time.  Pt in wheelchair  Contracture-- R wrist / arm RLE weakness---  No change  Psychiatric: She has a normal mood and affect.  Nursing note and vitals reviewed.  BP (!) 166/93 (BP Location: Left Arm, Patient Position: Sitting, Cuff Size: Large)   Pulse 61   Temp 98.3 F (36.8 C) (Oral)   Resp 16   Ht 5' 4.17" (1.63 m)   SpO2 98%   BMI 31.50 kg/m  Wt Readings from Last 3 Encounters:  10/05/17 184 lb 8.4 oz (83.7 kg)  05/23/17 195 lb (88.5 kg)  04/28/17 197 lb (89.4 kg)     Lab Results  Component Value Date   WBC 8.5 10/15/2017   HGB 11.2 (L) 10/15/2017   HCT 34.5 (L) 10/15/2017   PLT 208 10/15/2017   GLUCOSE 75 10/15/2017   CHOL 147 10/06/2017   TRIG 64 10/06/2017   HDL 58 10/06/2017   LDLCALC 76 10/06/2017   ALT 11 (L) 10/15/2017   AST 17 10/15/2017   NA 140 10/15/2017   K 4.4 10/15/2017   CL 109 10/15/2017   CREATININE 0.66 10/15/2017   BUN 6 10/15/2017   CO2 24 10/15/2017   TSH 0.08 (L) 09/22/2017   INR 1.5 12/08/2017   HGBA1C 5.0 10/06/2017   MICROALBUR 1.0 01/26/2016    Ct Angio Head W Or Wo Contrast  Result Date: 10/05/2017 CLINICAL DATA:  Slurred speech.  Prior stroke. EXAM: CT ANGIOGRAPHY HEAD AND NECK CT PERFUSION BRAIN TECHNIQUE: Multidetector CT imaging of the head and neck was performed using the standard protocol during bolus administration of intravenous contrast. Multiplanar CT image  reconstructions and MIPs were obtained to evaluate the vascular anatomy. Carotid stenosis measurements (when applicable) are obtained utilizing NASCET criteria, using the distal internal carotid diameter as the denominator. Multiphase CT imaging of the brain was performed following IV bolus contrast injection. Subsequent parametric perfusion maps were calculated using RAPID software. CONTRAST:  90mL ISOVUE-370 IOPAMIDOL (ISOVUE-370) INJECTION 76% COMPARISON:  None. FINDINGS: CTA NECK FINDINGS Aortic arch: Normal variant aortic arch branching pattern with common origin of brachiocephalic and left common carotid arteries. Mild arch atherosclerosis. Widely patent arch vessel origins. Right carotid system: Patent with mild calcified and soft plaque at the carotid bifurcation. No significant stenosis or evidence of dissection. Left carotid system: Patent with minimal  calcified plaque at the carotid bifurcation. No stenosis or evidence of dissection. Vertebral arteries: Patent and codominant without evidence of stenosis or dissection. Skeleton: Remote, solid interbody osseous fusion at C4-5. Bilateral C4-5 facet ankylosis. Multilevel disc degeneration, advanced at C5-6 and C6-7. Bulky posterior spurring/disc osteophyte complex at C5-6 results in moderate spinal stenosis. Other neck: No mass or enlarged lymph nodes. Upper chest: Clear lung apices. Review of the MIP images confirms the above findings CTA HEAD FINDINGS Anterior circulation: The internal carotid arteries are patent from skull base to carotid termini with mild nonstenotic siphon atherosclerosis bilaterally. ACAs and MCAs are patent without evidence of proximal branch occlusion or significant proximal stenosis. No aneurysm or vascular malformation. Posterior circulation: The intracranial vertebral arteries are widely patent to the basilar. PICAs and SCAs are unremarkable. AICAs are small and not well evaluated. The basilar artery is widely patent. There is a  small right posterior communicating artery. PCAs are patent without evidence of significant stenosis. No aneurysm or vascular malformation. Venous sinuses: As permitted by contrast timing, patent. Anatomic variants: None. Delayed phase: Not performed. 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 IMPRESSION: 1. No large vessel occlusion. 2. Mild intracranial and cervical atherosclerosis without significant stenosis or aneurysm. 3. No evidence of acute ischemia on perfusion imaging. 4. Increased cortical blood flow throughout the posterior left cerebral hemisphere, query seizure activity in the setting of an old left parietal cortical infarct. These results were called by telephone at the time of interpretation on 10/05/2017 at 2:43 pm to Dr. Ritta Slot , who verbally acknowledged these results. Electronically Signed   By: Sebastian Ache M.D.   On: 10/05/2017 14:58   Ct Angio Neck W Or Wo Contrast  Result Date: 10/05/2017 CLINICAL DATA:  Slurred speech.  Prior stroke. EXAM: CT ANGIOGRAPHY HEAD AND NECK CT PERFUSION BRAIN TECHNIQUE: Multidetector CT imaging of the head and neck was performed using the standard protocol during bolus administration of intravenous contrast. Multiplanar CT image reconstructions and MIPs were obtained to evaluate the vascular anatomy. Carotid stenosis measurements (when applicable) are obtained utilizing NASCET criteria, using the distal internal carotid diameter as the denominator. Multiphase CT imaging of the brain was performed following IV bolus contrast injection. Subsequent parametric perfusion maps were calculated using  RAPID software. CONTRAST:  90mL ISOVUE-370 IOPAMIDOL (ISOVUE-370) INJECTION 76% COMPARISON:  None. FINDINGS: CTA NECK FINDINGS Aortic arch: Normal variant aortic arch branching pattern with common origin of brachiocephalic and left common carotid arteries. Mild arch atherosclerosis. Widely patent arch vessel origins. Right carotid system: Patent with mild calcified and soft plaque at the carotid bifurcation. No significant stenosis or evidence of dissection. Left carotid system: Patent with minimal calcified plaque at the carotid bifurcation. No stenosis or evidence of dissection. Vertebral arteries: Patent and codominant without evidence of stenosis or dissection. Skeleton: Remote, solid interbody osseous fusion at C4-5. Bilateral C4-5 facet ankylosis. Multilevel disc degeneration, advanced at C5-6 and C6-7. Bulky posterior spurring/disc osteophyte complex at C5-6 results in moderate spinal stenosis. Other neck: No mass or enlarged lymph nodes. Upper chest: Clear lung apices. Review of the MIP images confirms the above findings CTA HEAD FINDINGS Anterior circulation: The internal carotid arteries are patent  from skull base to carotid termini with mild nonstenotic siphon atherosclerosis bilaterally. ACAs and MCAs are patent without evidence of proximal branch occlusion or significant proximal stenosis. No aneurysm or vascular malformation. Posterior circulation: The intracranial vertebral arteries are widely patent to the basilar. PICAs and SCAs are unremarkable. AICAs are small and not well evaluated. The basilar artery is widely patent. There is a small right posterior communicating artery. PCAs are patent without evidence of significant stenosis. No aneurysm or vascular malformation. Venous sinuses: As permitted by contrast timing, patent. Anatomic variants: None. Delayed phase: Not performed. Review of the MIP images confirms the above findings CT Brain Perfusion Findings: CBF (<30%) Volume: 0mL Perfusion  (Tmax>6.0s) volume: 0mL Mismatch Volume: n/a Infarction Location: n/a There is increased cerebral blood volume and blood flow diffusely throughout cortex in the posterior left temporal, left occipital, and left parietal lobes in the setting of an old high left parietal cortical infarct. IMPRESSION: 1. No large vessel occlusion. 2. Mild intracranial and cervical atherosclerosis without significant stenosis or aneurysm. 3. No evidence of acute ischemia on perfusion imaging. 4. Increased cortical blood flow throughout the posterior left cerebral hemisphere, query seizure activity in the setting of an old left parietal cortical infarct. These results were called by telephone at the time of interpretation on 10/05/2017 at 2:43 pm to Dr. Ritta Slot , who verbally acknowledged these results. Electronically Signed   By: Sebastian Ache M.D.   On: 10/05/2017 14:58   Ct Cerebral Perfusion W Contrast  Result Date: 10/05/2017 CLINICAL DATA:  Slurred speech.  Prior stroke. EXAM: CT ANGIOGRAPHY HEAD AND NECK CT PERFUSION BRAIN TECHNIQUE: Multidetector CT imaging of the head and neck was performed using the standard protocol during bolus administration of intravenous contrast. Multiplanar CT image reconstructions and MIPs were obtained to evaluate the vascular anatomy. Carotid stenosis measurements (when applicable) are obtained utilizing NASCET criteria, using the distal internal carotid diameter as the denominator. Multiphase CT imaging of the brain was performed following IV bolus contrast injection. Subsequent parametric perfusion maps were calculated using RAPID software. CONTRAST:  90mL ISOVUE-370 IOPAMIDOL (ISOVUE-370) INJECTION 76% COMPARISON:  None. FINDINGS: CTA NECK FINDINGS Aortic arch: Normal variant aortic arch branching pattern with common origin of brachiocephalic and left common carotid arteries. Mild arch atherosclerosis. Widely patent arch vessel origins. Right carotid system: Patent with mild calcified  and soft plaque at the carotid bifurcation. No significant stenosis or evidence of dissection. Left carotid system: Patent with minimal calcified plaque at the carotid bifurcation. No stenosis or evidence of dissection. Vertebral arteries: Patent and codominant without evidence of stenosis or dissection. Skeleton: Remote, solid interbody osseous fusion at C4-5. Bilateral C4-5 facet ankylosis. Multilevel disc degeneration, advanced at C5-6 and C6-7. Bulky posterior spurring/disc osteophyte complex at C5-6 results in moderate spinal stenosis. Other neck: No mass or enlarged lymph nodes. Upper chest: Clear lung apices. Review of the MIP images confirms the above findings CTA HEAD FINDINGS Anterior circulation: The internal carotid arteries are patent from skull base to carotid termini with mild nonstenotic siphon atherosclerosis bilaterally. ACAs and MCAs are patent without evidence of proximal branch occlusion or significant proximal stenosis. No aneurysm or vascular malformation. Posterior circulation: The intracranial vertebral arteries are widely patent to the basilar. PICAs and SCAs are unremarkable. AICAs are small and not well evaluated. The basilar artery is widely patent. There is a small right posterior communicating artery. PCAs are patent without evidence of significant stenosis. No aneurysm or vascular malformation. Venous sinuses: As permitted by contrast timing,  patent. Anatomic variants: None. Delayed phase: Not performed. 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 IMPRESSION: 1. No large vessel occlusion. 2. Mild intracranial and cervical  atherosclerosis without significant stenosis or aneurysm. 3. No evidence of acute ischemia on perfusion imaging. 4. Increased cortical blood flow throughout the posterior left cerebral hemisphere, query seizure activity in the setting of an old left parietal cortical infarct. These results were called by telephone at the time of interpretation on 10/05/2017 at 2:43 pm to Dr. Ritta Slot , who verbally acknowledged these results. Electronically Signed   By: Sebastian Ache M.D.   On: 10/05/2017 14:58   Ct Head Code Stroke Wo Contrast  Result Date: 10/05/2017 CLINICAL DATA:  Code stroke. Slurred speech. Prior stroke with residual right-sided deficits. EXAM: CT HEAD WITHOUT CONTRAST TECHNIQUE: Contiguous axial images were obtained from the base of the skull through the vertex without intravenous contrast. COMPARISON:  01/31/2005 FINDINGS: Brain: There is no evidence of acute infarct, intracranial hemorrhage, mass, midline shift, or extra-axial fluid collection. There is a chronic left frontoparietal cortical and subcortical infarct near the vertex which is new from the prior study. Mild lateral and third ventriculomegaly is new and felt to reflect central predominant cerebral atrophy rather than hydrocephalus. Periventricular white matter hypodensities are nonspecific but compatible with mild chronic small vessel ischemic disease. A chronic subcentimeter infarct in the left cerebellum is new. Vascular: Calcified atherosclerosis at the skull base. No hyperdense vessel. Skull: No fracture focal osseous lesion. Sinuses/Orbits: Tiny left maxillary sinus mucous retention cyst. Clear mastoid air cells. Unremarkable orbits. Other: None. ASPECTS [facility] Stroke Program Early CT Score) - Ganglionic level infarction (caudate, lentiform nuclei, internal capsule, insula, M1-M3 cortex): 7 - Supraganglionic infarction (M4-M6 cortex): 3 Total score (0-10 with 10 being normal): 10 IMPRESSION: 1. No evidence of acute  intracranial abnormality. 2. ASPECTS is 10. 3. 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 4. Mild chronic small vessel ischemic disease and cerebral atrophy. These results were called by telephone at the time of interpretation on 10/05/2017 at 12:49 pm to Dr. Gwyneth Sprout , who verbally acknowledged these results. Electronically Signed   By: Sebastian Ache M.D.   On: 10/05/2017 12:50     Assessment & Plan:  Plan  I have discontinued Aleta Sizelove's levETIRAcetam. I am also having her start on warfarin and levETIRAcetam. Additionally, I am having her maintain her aspirin, Vitamin D3, amLODipine, levothyroxine, LYRICA, acetaminophen, calcium carbonate, ferrous sulfate, mirtazapine, polyethylene glycol, senna-docusate, lacosamide, phenytoin, levothyroxine, gabapentin, pantoprazole, magnesium oxide, losartan, and enoxaparin.  Meds ordered this encounter  Medications  . DISCONTD: enoxaparin (LOVENOX) 40 MG/0.4ML injection    Sig: 90 mg sq qd  . warfarin (COUMADIN) 5 MG tablet    Sig: 10 mg on mon and 5 mg all other days    Dispense:  45 tablet    Refill:  3  . DISCONTD: losartan (COZAAR) 100 MG tablet    Sig: Take 1 tablet (100 mg total) by mouth daily.    Dispense:  90 tablet    Refill:  3  . losartan (COZAAR) 100 MG tablet    Sig: Take 1 tablet (100 mg total) by mouth daily.  Dispense:  90 tablet    Refill:  3  . enoxaparin (LOVENOX) 40 MG/0.4ML injection    Sig: 90 mg sq qd    Dispense:  0 Syringe  . levETIRAcetam (KEPPRA) 1000 MG tablet    Sig: 2 po bid    Dispense:  120 tablet    Refill:  2    Problem List Items Addressed This Visit      Unprioritized   Abnormal thyroid blood test    Recheck today      Relevant Orders   TSH   T3, free   T4, free   Anemia    Recheck labs today      Relevant Orders   CBC with Differential/Platelet   IBC panel   Ferritin   CVA (cerebral vascular accident) [facility])    Per neurology      Relevant Medications     warfarin (COUMADIN) 5 MG tablet   losartan (COZAAR) 100 MG tablet   enoxaparin (LOVENOX) 40 MG/0.4ML injection   Essential hypertension    In losartan to 100 mg  con't to check bp at home  F/u 3 months or sooner prn  Home health checking bp as well       Relevant Medications   warfarin (COUMADIN) 5 MG tablet   losartan (COZAAR) 100 MG tablet   enoxaparin (LOVENOX) 40 MG/0.4ML injection   Hypothyroidism   Relevant Orders   TSH   Pulmonary embolism and infarction (HCC) - Primary   Relevant Medications   warfarin (COUMADIN) 5 MG tablet   losartan (COZAAR) 100 MG tablet   enoxaparin (LOVENOX) 40 MG/0.4ML injection   Other Relevant Orders   POCT INR (Completed)   Seizure disorder (HCC)    keppra refilled  F/u  Neuro later this week       Relevant Medications   levETIRAcetam (KEPPRA) 1000 MG tablet    Other Visit Diagnoses    Decreased hearing of both ears       Relevant Orders   Ambulatory referral to Audiology    pt here >45 min with >50% face to face discussing pt/ inr, er visit for hosp stay, hearing and anemia  Follow-up: Return in about 3 months (around 03/10/2018), or if symptoms worsen or fail to improve.  Donato Schultz, DO

## 2017-12-08 NOTE — Telephone Encounter (Signed)
That would be great --- bp too please

## 2017-12-08 NOTE — Telephone Encounter (Signed)
Received request for Medical Records from [facility]; forwarded to [place] for email/scan/SLS 05/13

## 2017-12-08 NOTE — Assessment & Plan Note (Addendum)
Lab Results  Component Value Date   INR 1.5 12/08/2017   INR 1.01 10/05/2017   INR 1.1 (H) 11/19/2016   Coumadin 10 mg on Mon and 5 mg all other days -- home health to recheck Wednesday con't lovenox

## 2017-12-08 NOTE — Assessment & Plan Note (Signed)
In losartan to 100 mg  con't to check bp at home  F/u 3 months or sooner prn  Home health checking bp as well

## 2017-12-08 NOTE — Telephone Encounter (Signed)
Are you ok with this? 

## 2017-12-09 ENCOUNTER — Other Ambulatory Visit: Payer: Self-pay

## 2017-12-09 ENCOUNTER — Telehealth: Payer: Self-pay | Admitting: *Deleted

## 2017-12-09 LAB — CBC WITH DIFFERENTIAL/PLATELET
BASOS PCT: 1.2 % (ref 0.0–3.0)
Basophils Absolute: 0.1 10*3/uL (ref 0.0–0.1)
Eosinophils Absolute: 0.2 10*3/uL (ref 0.0–0.7)
Eosinophils Relative: 3.9 % (ref 0.0–5.0)
HCT: 35.3 % — ABNORMAL LOW (ref 36.0–46.0)
HEMOGLOBIN: 11.9 g/dL — AB (ref 12.0–15.0)
LYMPHS ABS: 1.9 10*3/uL (ref 0.7–4.0)
Lymphocytes Relative: 43.7 % (ref 12.0–46.0)
MCHC: 33.6 g/dL (ref 30.0–36.0)
MCV: 86.1 fl (ref 78.0–100.0)
MONOS PCT: 8.4 % (ref 3.0–12.0)
Monocytes Absolute: 0.4 10*3/uL (ref 0.1–1.0)
Neutro Abs: 1.9 10*3/uL (ref 1.4–7.7)
Neutrophils Relative %: 42.8 % — ABNORMAL LOW (ref 43.0–77.0)
Platelets: 209 10*3/uL (ref 150.0–400.0)
RBC: 4.1 Mil/uL (ref 3.87–5.11)
RDW: 14.4 % (ref 11.5–15.5)
WBC: 4.3 10*3/uL (ref 4.0–10.5)

## 2017-12-09 LAB — T4, FREE: Free T4: 0.8 ng/dL (ref 0.60–1.60)

## 2017-12-09 LAB — IBC PANEL
Iron: 37 ug/dL — ABNORMAL LOW (ref 42–145)
SATURATION RATIOS: 10.4 % — AB (ref 20.0–50.0)
Transferrin: 253 mg/dL (ref 212.0–360.0)

## 2017-12-09 LAB — TSH: TSH: 1.48 u[IU]/mL (ref 0.35–4.50)

## 2017-12-09 LAB — T3, FREE: T3, Free: 2.9 pg/mL (ref 2.3–4.2)

## 2017-12-09 LAB — FERRITIN: FERRITIN: 17.6 ng/mL (ref 10.0–291.0)

## 2017-12-09 MED FILL — WARFARIN SODIUM 5 MG TABLET: 5 | 38 days supply | Qty: 45 | Fill #0

## 2017-12-09 NOTE — Telephone Encounter (Signed)
Called Lanora Manis back to give orders no answer left detailed message on voicemail

## 2017-12-09 NOTE — Patient Outreach (Signed)
[facility]) Care Management  12/09/2017  Kathleen Jensen May 09, 1951 [id number]   BSW contacted patient's daughter, Kathleen Jensen, as indicated on referral for transportation assistance. HIPAA identifiers verified. [name] informed BSW that patient has 1 round trip left through [facility] which patient will most likely use in the near future as daughter plans to schedule patient an audiology appointment.   [name] informed BSW that she likes to ride with patient to her appointments but can follow behind the transportation service in her own vehicle. Patient is currently in a wheelchair and unable to transfer into a car safely. [name] stated that if/when the patient is able to safely transfer into a vehicle the patient will no longer need transportation services. Kathleen Jensen is hoping that with the assistance of physical therapy patient will continue to improve and be able to transfer safely.   BSW spoke with Kathleen Jensen about [facility] as an option. Patient does have a neurologist in [place]. BSW unsure if [facility] will transport to [facility] but will inquire and get back to Kathleen Jensen.   Patients next upcomming appointment per daughter is in August. BSW explained [facility] ability to assist with transportation to primary physician as well as specialty appointments if needed. Kathleen Jensen took this BSW's name and contact information and is aware to call if transportation needs arise before patient is approved for [facility].  Plan: BSW to contact Kathleen Jensen later in the week to discuss application process of [facility]. Kathleen Jensen to contact this BSW if transportation needs arise before next contact.  [name], BSW, CDP Social Worker Cell # [phone] [name]

## 2017-12-09 NOTE — Telephone Encounter (Signed)
Received Physician Orders from [facility]; forwarded to provider/SLS 05/14

## 2017-12-10 ENCOUNTER — Telehealth: Payer: Self-pay | Admitting: Family Medicine

## 2017-12-10 DIAGNOSIS — I10 Essential (primary) hypertension: Secondary | ICD-10-CM | POA: Diagnosis not present

## 2017-12-10 DIAGNOSIS — R569 Unspecified convulsions: Secondary | ICD-10-CM | POA: Diagnosis not present

## 2017-12-10 DIAGNOSIS — E854 Organ-limited amyloidosis: Secondary | ICD-10-CM | POA: Diagnosis not present

## 2017-12-10 DIAGNOSIS — I629 Nontraumatic intracranial hemorrhage, unspecified: Secondary | ICD-10-CM | POA: Diagnosis not present

## 2017-12-10 DIAGNOSIS — Z79899 Other long term (current) drug therapy: Secondary | ICD-10-CM | POA: Diagnosis not present

## 2017-12-10 DIAGNOSIS — I680 Cerebral amyloid angiopathy: Secondary | ICD-10-CM | POA: Diagnosis not present

## 2017-12-10 DIAGNOSIS — Z7901 Long term (current) use of anticoagulants: Secondary | ICD-10-CM | POA: Diagnosis not present

## 2017-12-10 DIAGNOSIS — Z86711 Personal history of pulmonary embolism: Secondary | ICD-10-CM | POA: Diagnosis not present

## 2017-12-10 DIAGNOSIS — J45909 Unspecified asthma, uncomplicated: Secondary | ICD-10-CM | POA: Diagnosis not present

## 2017-12-10 DIAGNOSIS — E1122 Type 2 diabetes mellitus with diabetic chronic kidney disease: Secondary | ICD-10-CM | POA: Diagnosis not present

## 2017-12-10 DIAGNOSIS — I2699 Other pulmonary embolism without acute cor pulmonale: Secondary | ICD-10-CM | POA: Diagnosis not present

## 2017-12-10 DIAGNOSIS — I69351 Hemiplegia and hemiparesis following cerebral infarction affecting right dominant side: Secondary | ICD-10-CM | POA: Diagnosis not present

## 2017-12-10 DIAGNOSIS — I69320 Aphasia following cerebral infarction: Secondary | ICD-10-CM | POA: Diagnosis not present

## 2017-12-10 DIAGNOSIS — I129 Hypertensive chronic kidney disease with stage 1 through stage 4 chronic kidney disease, or unspecified chronic kidney disease: Secondary | ICD-10-CM | POA: Diagnosis not present

## 2017-12-10 DIAGNOSIS — N183 Chronic kidney disease, stage 3 (moderate): Secondary | ICD-10-CM | POA: Diagnosis not present

## 2017-12-10 DIAGNOSIS — M47816 Spondylosis without myelopathy or radiculopathy, lumbar region: Secondary | ICD-10-CM | POA: Diagnosis not present

## 2017-12-10 DIAGNOSIS — Z5181 Encounter for therapeutic drug level monitoring: Secondary | ICD-10-CM | POA: Diagnosis not present

## 2017-12-10 DIAGNOSIS — I69398 Other sequelae of cerebral infarction: Secondary | ICD-10-CM | POA: Diagnosis not present

## 2017-12-10 DIAGNOSIS — R6889 Other general symptoms and signs: Secondary | ICD-10-CM | POA: Diagnosis not present

## 2017-12-10 DIAGNOSIS — Z882 Allergy status to sulfonamides status: Secondary | ICD-10-CM | POA: Diagnosis not present

## 2017-12-10 DIAGNOSIS — Z8679 Personal history of other diseases of the circulatory system: Secondary | ICD-10-CM | POA: Diagnosis not present

## 2017-12-10 NOTE — Telephone Encounter (Signed)
Instruct home health to send Inr to neuro from now on

## 2017-12-10 NOTE — Telephone Encounter (Signed)
Have her take 10 mg M and W and 5 all other days --- recheck Friday,  con't lovenox

## 2017-12-10 NOTE — Telephone Encounter (Signed)
Author phoned to update her on warfarin change, and need to f/u with nurse for INR check on Friday 5/17 per Dr. Laury Axon. Pt was currently in neurologist's office when author called cell. Pt. to return call to [phone] when she is finished.

## 2017-12-10 NOTE — Telephone Encounter (Signed)
Daughter, Santiago Bumpers, returned phone call. Daughter states that Dr. Earna Coder Ward, neurologist at [facility], whom pt. had just seen, does not recommend pt. change her warfarin dosage, and has instructed pt. 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 Daughter states neurologist is aware of recent INR of 1.9. Findings routed to Dr. Laury Axon for Dr. to follow up with neurology.

## 2017-12-10 NOTE — Telephone Encounter (Signed)
Copied from CRM [id number]. Topic: Quick Communication - See Telephone Encounter >> Dec 10, 2017 11:26 AM Diana Eves B wrote: CRM for notification. See Telephone encounter for: 12/10/17.  Deanna Artis with [facility] home [facility] calling to give INR results 1.9 CB# [id number].

## 2017-12-11 ENCOUNTER — Other Ambulatory Visit: Payer: Self-pay

## 2017-12-11 ENCOUNTER — Other Ambulatory Visit: Payer: Self-pay | Admitting: *Deleted

## 2017-12-11 ENCOUNTER — Telehealth: Payer: Self-pay | Admitting: Family Medicine

## 2017-12-11 DIAGNOSIS — I69320 Aphasia following cerebral infarction: Secondary | ICD-10-CM | POA: Diagnosis not present

## 2017-12-11 DIAGNOSIS — I69398 Other sequelae of cerebral infarction: Secondary | ICD-10-CM | POA: Diagnosis not present

## 2017-12-11 DIAGNOSIS — E1122 Type 2 diabetes mellitus with diabetic chronic kidney disease: Secondary | ICD-10-CM | POA: Diagnosis not present

## 2017-12-11 DIAGNOSIS — M47816 Spondylosis without myelopathy or radiculopathy, lumbar region: Secondary | ICD-10-CM | POA: Diagnosis not present

## 2017-12-11 DIAGNOSIS — G40909 Epilepsy, unspecified, not intractable, without status epilepticus: Secondary | ICD-10-CM

## 2017-12-11 DIAGNOSIS — J45909 Unspecified asthma, uncomplicated: Secondary | ICD-10-CM | POA: Diagnosis not present

## 2017-12-11 DIAGNOSIS — N183 Chronic kidney disease, stage 3 (moderate): Secondary | ICD-10-CM | POA: Diagnosis not present

## 2017-12-11 DIAGNOSIS — I129 Hypertensive chronic kidney disease with stage 1 through stage 4 chronic kidney disease, or unspecified chronic kidney disease: Secondary | ICD-10-CM | POA: Diagnosis not present

## 2017-12-11 DIAGNOSIS — R569 Unspecified convulsions: Secondary | ICD-10-CM | POA: Diagnosis not present

## 2017-12-11 DIAGNOSIS — I69351 Hemiplegia and hemiparesis following cerebral infarction affecting right dominant side: Secondary | ICD-10-CM | POA: Diagnosis not present

## 2017-12-11 MED ORDER — LEVETIRACETAM 1000 MG PO TABS: ORAL_TABLET | ORAL | 2 refills | Status: DC

## 2017-12-11 MED FILL — levETIRAcetam 1000 MG TABS: 1000 | 30 days supply | Qty: 120 | Fill #0

## 2017-12-11 NOTE — Patient Outreach (Signed)
[facility]) Care Management  12/11/2017  Kathleen Jensen 1950-09-12 [id number]   CSW received an incoming call from patient's daughter, Kathleen Jensen today, requesting to cancel the home visit scheduled with patient and CSW on Friday, Dec 12, 2017.  [name] reports that patient is still confused; therefore, unable to complete her Advanced Directives ([place] documents).  [name] really wants to get these documents in place, but understands that they cannot be completed as long as patient is confused.  [name] indicated that she will get together with her sisters to try and determine the best route to take with regards to getting the documents initiated.  [name] went on to say that she will try and catch patient on a good day, when she is not so confused, to try and complete the documents.  CSW encouraged [name] to contact CSW directly if additional assistance is needed in the future.  [name] feels confident is being able to complete the documents with patient without the assistance of CSW.   CSW will perform a case closure on patient, as all goals of treatment have been met from social work standpoint and no additional social work needs have been identified at this time.  CSW will notify patient's Telephonic RNCM with [place] [facility], Kathleen Jensen of CSW's plans to close patient's case.  CSW will fax an update to patient's Primary Care Physician, Dr. Garnet Jensen to ensure that they are aware of CSW's involvement with patient's plan of care.  Patient will continue to be followed by Kathleen Jensen, Social Work [employer], also with Scientist, clinical (histocompatibility and immunogenetics) for transportation assistance. [facility] CM Care Plan Problem One     Most Recent Value  Care Plan Problem One  No Advanced Directives in place.  Role Documenting the Problem One  Clinical Social Worker  Care Plan for  Problem One  Active  [facility] Long Term Goal   Patient will have Advanced Directives (Living Will and [place] documents) signed in front of witnesses and notarized from a representative within the [facility] system, within the next 35 days.  [facility] Long Term Goal Start Date  12/02/17  [facility] Long Term Goal Met Date  12/11/17  Interventions for Problem One Long Term Goal  Patient is unable to complete her Living Will and [place] documents at the present time, due to confusion.  [facility] CM Short Term Goal #1   Patient and daughter will meet with CSW for an initial home visit to review an Advanced Directives packet, including Living Will and [place] documents, within the next week.  [facility] CM Short Term Goal #1 Start Date  12/02/17  [facility] CM Short Term Goal #1 Met Date  12/05/17  [facility] CM Short Term Goal #2   Patient will receive assistance with completion of her Advanced Directives (Living Will and [place] documents) by CSW, within the next week.  [facility] CM Short Term Goal #2 Start Date  12/02/17  [facility] CM Short Term Goal #2 Met Date  12/11/17  Interventions for Short Term Goal #2  CSW attempted to assist patient with completion of her Advanced Directives but patient was too confused at the time and remains confused at present.    Nat Christen, BSW, MSW, LCSW  Licensed Education officer, environmental Health System  Mailing [place] [address], [place],  [place] [address] Physical Address-[address], [place], [place] [address] Toll Free Main # [phone] Fax # [phone] Cell # [phone]  Office # [phone] [email or web address]

## 2017-12-11 NOTE — Telephone Encounter (Signed)
Home health notified.

## 2017-12-11 NOTE — Telephone Encounter (Signed)
[facility]  

## 2017-12-11 NOTE — Telephone Encounter (Signed)
Copied from CRM [id number]. Topic: Quick Communication - See Telephone Encounter >> Dec 11, 2017  1:33 PM Terisa Starr wrote: CRM for notification. See Telephone encounter for: 12/11/17.   [name] ( daughter ) said the pharmacy does not have the medication at the pharmacy: She went there yesterday and it was not there. Please advise   levETIRAcetam (KEPPRA) 1000 MG tablet

## 2017-12-11 NOTE — Patient Outreach (Signed)
[facility]) Care Management  12/11/2017  Kathleen Jensen 06-26-1951 [id number]  BSW contacted patient and her daughter, Kathleen Jensen, to follow-up on transportation options for patient. BSW contacted [employer] who was able to verify HIPAA identifiers. Patient listened to conversation via speaker phone.   BSW discussed [facility] as an option for patients transportation to physician appointments. [facility] will transport to [facility] for appointments as long as they are scheduled after 10:00 am. [name] very interested in this service. TAMS representative informed this BSW there was a wait list for service, however patient will not need transportation until June 14. BSW to submit patient application today and follow up in the next two weeks to verify status.  [name] also discussed desire to obtain an in-home aide to assist patient throughout the day. Patient currently receiving a home health aide to assist with bathing. [name] would like her mother to have an aide at all times. Patient does possess a long-term care policy that will cover this service. [name] has initiated a claim with long term care policy and is awaiting a determination. [name] stated the physician paperwork was missing an item so she plans to submit again once PCP updates the form. BSW to mail [name] a list of home care agencies that take long term care insurance.  Plan:  BSW to contact [name] in the next two weeks to verify mailing received.  BSW to follow-up with TAMS in the next two weeks to inquire status of patient application. BSW to assist patient and her daughter in arranging transportation to upcomming appointment on 6/14.  Kathleen Jensen, BSW, CDP Social Worker Cell # [phone] [name]

## 2017-12-12 ENCOUNTER — Ambulatory Visit: Payer: Self-pay | Admitting: *Deleted

## 2017-12-12 ENCOUNTER — Telehealth: Payer: Self-pay | Admitting: *Deleted

## 2017-12-12 DIAGNOSIS — R6889 Other general symptoms and signs: Secondary | ICD-10-CM | POA: Diagnosis not present

## 2017-12-12 NOTE — Telephone Encounter (Signed)
Received Home Health Certification and Plan of Care; forwarded to provider/SLS 05/17  Received Physician Orders from [facility]; forwarded to provider/SLS 05/17

## 2017-12-13 DIAGNOSIS — I129 Hypertensive chronic kidney disease with stage 1 through stage 4 chronic kidney disease, or unspecified chronic kidney disease: Secondary | ICD-10-CM | POA: Diagnosis not present

## 2017-12-13 DIAGNOSIS — J45909 Unspecified asthma, uncomplicated: Secondary | ICD-10-CM | POA: Diagnosis not present

## 2017-12-13 DIAGNOSIS — N183 Chronic kidney disease, stage 3 (moderate): Secondary | ICD-10-CM | POA: Diagnosis not present

## 2017-12-13 DIAGNOSIS — I69398 Other sequelae of cerebral infarction: Secondary | ICD-10-CM | POA: Diagnosis not present

## 2017-12-13 DIAGNOSIS — R569 Unspecified convulsions: Secondary | ICD-10-CM | POA: Diagnosis not present

## 2017-12-13 DIAGNOSIS — I69351 Hemiplegia and hemiparesis following cerebral infarction affecting right dominant side: Secondary | ICD-10-CM | POA: Diagnosis not present

## 2017-12-13 DIAGNOSIS — E1122 Type 2 diabetes mellitus with diabetic chronic kidney disease: Secondary | ICD-10-CM | POA: Diagnosis not present

## 2017-12-13 DIAGNOSIS — I69320 Aphasia following cerebral infarction: Secondary | ICD-10-CM | POA: Diagnosis not present

## 2017-12-13 DIAGNOSIS — M47816 Spondylosis without myelopathy or radiculopathy, lumbar region: Secondary | ICD-10-CM | POA: Diagnosis not present

## 2017-12-15 ENCOUNTER — Telehealth: Payer: Self-pay | Admitting: Family Medicine

## 2017-12-15 DIAGNOSIS — N183 Chronic kidney disease, stage 3 (moderate): Secondary | ICD-10-CM | POA: Diagnosis not present

## 2017-12-15 DIAGNOSIS — R569 Unspecified convulsions: Secondary | ICD-10-CM | POA: Diagnosis not present

## 2017-12-15 DIAGNOSIS — M47816 Spondylosis without myelopathy or radiculopathy, lumbar region: Secondary | ICD-10-CM | POA: Diagnosis not present

## 2017-12-15 DIAGNOSIS — E1122 Type 2 diabetes mellitus with diabetic chronic kidney disease: Secondary | ICD-10-CM | POA: Diagnosis not present

## 2017-12-15 DIAGNOSIS — I129 Hypertensive chronic kidney disease with stage 1 through stage 4 chronic kidney disease, or unspecified chronic kidney disease: Secondary | ICD-10-CM | POA: Diagnosis not present

## 2017-12-15 DIAGNOSIS — I69320 Aphasia following cerebral infarction: Secondary | ICD-10-CM | POA: Diagnosis not present

## 2017-12-15 DIAGNOSIS — J45909 Unspecified asthma, uncomplicated: Secondary | ICD-10-CM | POA: Diagnosis not present

## 2017-12-15 DIAGNOSIS — I69351 Hemiplegia and hemiparesis following cerebral infarction affecting right dominant side: Secondary | ICD-10-CM | POA: Diagnosis not present

## 2017-12-15 DIAGNOSIS — I69398 Other sequelae of cerebral infarction: Secondary | ICD-10-CM | POA: Diagnosis not present

## 2017-12-15 NOTE — Telephone Encounter (Signed)
Neurology at [facility] is handling her inr now

## 2017-12-15 NOTE — Telephone Encounter (Signed)
[facility] informed of PCP instructions.

## 2017-12-15 NOTE — Telephone Encounter (Signed)
Briscoe Deutscher with [facility] called to report PT/INR 1.5. Please advise and call back number is [phone]

## 2017-12-16 ENCOUNTER — Telehealth: Payer: Self-pay | Admitting: Family Medicine

## 2017-12-16 DIAGNOSIS — N183 Chronic kidney disease, stage 3 (moderate): Secondary | ICD-10-CM | POA: Diagnosis not present

## 2017-12-16 DIAGNOSIS — J45909 Unspecified asthma, uncomplicated: Secondary | ICD-10-CM | POA: Diagnosis not present

## 2017-12-16 DIAGNOSIS — I129 Hypertensive chronic kidney disease with stage 1 through stage 4 chronic kidney disease, or unspecified chronic kidney disease: Secondary | ICD-10-CM | POA: Diagnosis not present

## 2017-12-16 DIAGNOSIS — I69351 Hemiplegia and hemiparesis following cerebral infarction affecting right dominant side: Secondary | ICD-10-CM | POA: Diagnosis not present

## 2017-12-16 DIAGNOSIS — M47816 Spondylosis without myelopathy or radiculopathy, lumbar region: Secondary | ICD-10-CM | POA: Diagnosis not present

## 2017-12-16 DIAGNOSIS — I69398 Other sequelae of cerebral infarction: Secondary | ICD-10-CM | POA: Diagnosis not present

## 2017-12-16 DIAGNOSIS — E1122 Type 2 diabetes mellitus with diabetic chronic kidney disease: Secondary | ICD-10-CM | POA: Diagnosis not present

## 2017-12-16 DIAGNOSIS — I69320 Aphasia following cerebral infarction: Secondary | ICD-10-CM | POA: Diagnosis not present

## 2017-12-16 DIAGNOSIS — R569 Unspecified convulsions: Secondary | ICD-10-CM | POA: Diagnosis not present

## 2017-12-16 NOTE — Telephone Encounter (Signed)
Copied from CRM [id number]. Topic: Quick Communication - See Telephone Encounter >> Dec 16, 2017 12:01 PM Diana Eves B wrote: CRM for notification. See Telephone encounter for: 12/16/17.  Luisa Hart PT assistant with [facility] calling to give mulitlpe BP readings from today: sitting left side manual cuff162/100; took again with wrist cuff 141/81; and OT assistant rechecked which was 180/102 with automatic cuff on left arm: rechecked with manual cuff 178/108. The ones with the OT assistant was after PT had been done. CB# [id number].

## 2017-12-16 NOTE — Telephone Encounter (Signed)
Shanda Bumps calling from [facility] to find out why Dr. Laury Axon would like the INR to be handled by Neurology? She says Neurology is saying it needs to be monitored by PCP. Please advise.

## 2017-12-16 NOTE — Telephone Encounter (Signed)
The daughter told us they would be handling it

## 2017-12-16 NOTE — Telephone Encounter (Signed)
How was her pulse?  We may be able to add toprol xl 50 mg daily if we are adjusting bp meds and pulse is not too low.

## 2017-12-17 ENCOUNTER — Other Ambulatory Visit: Payer: Self-pay

## 2017-12-17 DIAGNOSIS — E1122 Type 2 diabetes mellitus with diabetic chronic kidney disease: Secondary | ICD-10-CM | POA: Diagnosis not present

## 2017-12-17 DIAGNOSIS — R569 Unspecified convulsions: Secondary | ICD-10-CM | POA: Diagnosis not present

## 2017-12-17 DIAGNOSIS — M47816 Spondylosis without myelopathy or radiculopathy, lumbar region: Secondary | ICD-10-CM | POA: Diagnosis not present

## 2017-12-17 DIAGNOSIS — I69398 Other sequelae of cerebral infarction: Secondary | ICD-10-CM | POA: Diagnosis not present

## 2017-12-17 DIAGNOSIS — N183 Chronic kidney disease, stage 3 (moderate): Secondary | ICD-10-CM | POA: Diagnosis not present

## 2017-12-17 DIAGNOSIS — I129 Hypertensive chronic kidney disease with stage 1 through stage 4 chronic kidney disease, or unspecified chronic kidney disease: Secondary | ICD-10-CM | POA: Diagnosis not present

## 2017-12-17 DIAGNOSIS — J45909 Unspecified asthma, uncomplicated: Secondary | ICD-10-CM | POA: Diagnosis not present

## 2017-12-17 DIAGNOSIS — I69320 Aphasia following cerebral infarction: Secondary | ICD-10-CM | POA: Diagnosis not present

## 2017-12-17 DIAGNOSIS — I69351 Hemiplegia and hemiparesis following cerebral infarction affecting right dominant side: Secondary | ICD-10-CM | POA: Diagnosis not present

## 2017-12-17 NOTE — Telephone Encounter (Signed)
Spoke with daughter about INR.  She thought it was neurology as well.  She has set up an appt at a [facility] to better manage this.  She finished her last shot of Lovenox las Thursday and she started back doing the coumadin  on Monday and  on Wedneday.  Can she wait until Friday for the coumadin appt or can you make a change until then?  Daughter wanted to know if you have received any dilantin levels form [facility].  They are in the computer.  Not sure if you need to comment about that.  Lastly she wanted to know if patient needed to an iron supplement.  Her hemoglobin level was slightly low.

## 2017-12-17 NOTE — Telephone Encounter (Signed)
Spoke with Kathleen Jensen and he gave me a pulse of 59.  Not sure what was too low to make change.  Do you want to add?

## 2017-12-17 NOTE — Telephone Encounter (Signed)
If she was doing 10 mg MW and 5 all other days --- increase to 10 mg MWF and 5 all other days

## 2017-12-17 NOTE — Telephone Encounter (Signed)
No -- not toprol Is she taking the norvasc--- looks like it has not been filled in a while

## 2017-12-17 NOTE — Patient Outreach (Signed)
[facility]) Care Management  12/17/2017  Kathleen Jensen 1951-02-24 [id number]  BSW received call from patients daughter stating patient has been referred to a coumadin specialist due to inability to stabilize INR. Appointment is scheduled for this Friday 5/24 at 11:45 am. Patients application for [facility] has not been processed. BSW assisted in arranging transportation through [employer]. Patients daughter agreeable to patients ability to transfer with help of a CNA into vehicle for appointment.   BSW to follow up with patient next week to discuss [facility] application status.  [name], BSW, CDP Social Worker Cell # [phone] [name]

## 2017-12-18 ENCOUNTER — Encounter: Payer: Self-pay | Admitting: *Deleted

## 2017-12-18 DIAGNOSIS — J45909 Unspecified asthma, uncomplicated: Secondary | ICD-10-CM | POA: Diagnosis not present

## 2017-12-18 DIAGNOSIS — M47816 Spondylosis without myelopathy or radiculopathy, lumbar region: Secondary | ICD-10-CM | POA: Diagnosis not present

## 2017-12-18 DIAGNOSIS — I69398 Other sequelae of cerebral infarction: Secondary | ICD-10-CM | POA: Diagnosis not present

## 2017-12-18 DIAGNOSIS — R569 Unspecified convulsions: Secondary | ICD-10-CM | POA: Diagnosis not present

## 2017-12-18 DIAGNOSIS — E1122 Type 2 diabetes mellitus with diabetic chronic kidney disease: Secondary | ICD-10-CM | POA: Diagnosis not present

## 2017-12-18 DIAGNOSIS — N183 Chronic kidney disease, stage 3 (moderate): Secondary | ICD-10-CM | POA: Diagnosis not present

## 2017-12-18 DIAGNOSIS — I69320 Aphasia following cerebral infarction: Secondary | ICD-10-CM | POA: Diagnosis not present

## 2017-12-18 DIAGNOSIS — I129 Hypertensive chronic kidney disease with stage 1 through stage 4 chronic kidney disease, or unspecified chronic kidney disease: Secondary | ICD-10-CM | POA: Diagnosis not present

## 2017-12-18 DIAGNOSIS — I69351 Hemiplegia and hemiparesis following cerebral infarction affecting right dominant side: Secondary | ICD-10-CM | POA: Diagnosis not present

## 2017-12-18 NOTE — Telephone Encounter (Signed)
Yes-- I see the dilantin level but neuro said in their note that they would f/u with the pt on that--- it was a little low I would start norvasc 5 mg #30  1 po qd  Have home health call us with bp

## 2017-12-18 NOTE — Telephone Encounter (Signed)
Patient daughter notified to start norvasc.  She stated that patient had some , mother had plenty.  Advise to take 1/2 tablet and when she runs out we will send in  to take 1 tab qd.  Also notified about the dilantin level.

## 2017-12-18 NOTE — Telephone Encounter (Signed)
Daughter states that the hospital took her off the norvasc back when she went in for the first seizure in Jan(?).  Daughter wanted to know if you have received any dilantin levels form [facility].  They are in the computer.  Not sure if you need to comment about that.  Lastly she wanted to know if patient needed to an iron supplement.  Her hemoglobin level was slightly low

## 2017-12-18 NOTE — Telephone Encounter (Signed)
Patient daughter notified of changes.

## 2017-12-19 DIAGNOSIS — Z7901 Long term (current) use of anticoagulants: Secondary | ICD-10-CM | POA: Diagnosis not present

## 2017-12-19 DIAGNOSIS — I2699 Other pulmonary embolism without acute cor pulmonale: Secondary | ICD-10-CM | POA: Diagnosis not present

## 2017-12-19 DIAGNOSIS — Z5181 Encounter for therapeutic drug level monitoring: Secondary | ICD-10-CM | POA: Diagnosis not present

## 2017-12-22 DIAGNOSIS — I69320 Aphasia following cerebral infarction: Secondary | ICD-10-CM | POA: Diagnosis not present

## 2017-12-22 DIAGNOSIS — I69351 Hemiplegia and hemiparesis following cerebral infarction affecting right dominant side: Secondary | ICD-10-CM | POA: Diagnosis not present

## 2017-12-22 DIAGNOSIS — I129 Hypertensive chronic kidney disease with stage 1 through stage 4 chronic kidney disease, or unspecified chronic kidney disease: Secondary | ICD-10-CM | POA: Diagnosis not present

## 2017-12-22 DIAGNOSIS — E1122 Type 2 diabetes mellitus with diabetic chronic kidney disease: Secondary | ICD-10-CM | POA: Diagnosis not present

## 2017-12-22 DIAGNOSIS — N183 Chronic kidney disease, stage 3 (moderate): Secondary | ICD-10-CM | POA: Diagnosis not present

## 2017-12-22 DIAGNOSIS — R569 Unspecified convulsions: Secondary | ICD-10-CM | POA: Diagnosis not present

## 2017-12-22 DIAGNOSIS — M47816 Spondylosis without myelopathy or radiculopathy, lumbar region: Secondary | ICD-10-CM | POA: Diagnosis not present

## 2017-12-22 DIAGNOSIS — I69398 Other sequelae of cerebral infarction: Secondary | ICD-10-CM | POA: Diagnosis not present

## 2017-12-22 DIAGNOSIS — J45909 Unspecified asthma, uncomplicated: Secondary | ICD-10-CM | POA: Diagnosis not present

## 2017-12-23 ENCOUNTER — Telehealth: Payer: Self-pay | Admitting: Family Medicine

## 2017-12-23 DIAGNOSIS — N183 Chronic kidney disease, stage 3 (moderate): Secondary | ICD-10-CM | POA: Diagnosis not present

## 2017-12-23 DIAGNOSIS — E1122 Type 2 diabetes mellitus with diabetic chronic kidney disease: Secondary | ICD-10-CM | POA: Diagnosis not present

## 2017-12-23 DIAGNOSIS — M47816 Spondylosis without myelopathy or radiculopathy, lumbar region: Secondary | ICD-10-CM | POA: Diagnosis not present

## 2017-12-23 DIAGNOSIS — I69351 Hemiplegia and hemiparesis following cerebral infarction affecting right dominant side: Secondary | ICD-10-CM | POA: Diagnosis not present

## 2017-12-23 DIAGNOSIS — J45909 Unspecified asthma, uncomplicated: Secondary | ICD-10-CM | POA: Diagnosis not present

## 2017-12-23 DIAGNOSIS — I69320 Aphasia following cerebral infarction: Secondary | ICD-10-CM | POA: Diagnosis not present

## 2017-12-23 DIAGNOSIS — I69398 Other sequelae of cerebral infarction: Secondary | ICD-10-CM | POA: Diagnosis not present

## 2017-12-23 DIAGNOSIS — R569 Unspecified convulsions: Secondary | ICD-10-CM | POA: Diagnosis not present

## 2017-12-23 DIAGNOSIS — I129 Hypertensive chronic kidney disease with stage 1 through stage 4 chronic kidney disease, or unspecified chronic kidney disease: Secondary | ICD-10-CM | POA: Diagnosis not present

## 2017-12-23 NOTE — Telephone Encounter (Signed)
Beth at [employer] given verbal order for Child psychotherapist.

## 2017-12-23 NOTE — Telephone Encounter (Signed)
Copied from CRM [id number]. Topic: Inquiry >> Dec 23, 2017  2:07 PM Crist Infante wrote: Reason for CRM: beth with [facility] calling to request verbal for social worker eval for resources pt may qualify to help with her care.

## 2017-12-24 ENCOUNTER — Ambulatory Visit: Payer: Self-pay

## 2017-12-24 DIAGNOSIS — I69320 Aphasia following cerebral infarction: Secondary | ICD-10-CM | POA: Diagnosis not present

## 2017-12-24 DIAGNOSIS — E1122 Type 2 diabetes mellitus with diabetic chronic kidney disease: Secondary | ICD-10-CM | POA: Diagnosis not present

## 2017-12-24 DIAGNOSIS — N183 Chronic kidney disease, stage 3 (moderate): Secondary | ICD-10-CM | POA: Diagnosis not present

## 2017-12-24 DIAGNOSIS — J45909 Unspecified asthma, uncomplicated: Secondary | ICD-10-CM | POA: Diagnosis not present

## 2017-12-24 DIAGNOSIS — I129 Hypertensive chronic kidney disease with stage 1 through stage 4 chronic kidney disease, or unspecified chronic kidney disease: Secondary | ICD-10-CM | POA: Diagnosis not present

## 2017-12-24 DIAGNOSIS — I69351 Hemiplegia and hemiparesis following cerebral infarction affecting right dominant side: Secondary | ICD-10-CM | POA: Diagnosis not present

## 2017-12-24 DIAGNOSIS — I69398 Other sequelae of cerebral infarction: Secondary | ICD-10-CM | POA: Diagnosis not present

## 2017-12-24 DIAGNOSIS — M47816 Spondylosis without myelopathy or radiculopathy, lumbar region: Secondary | ICD-10-CM | POA: Diagnosis not present

## 2017-12-24 DIAGNOSIS — R569 Unspecified convulsions: Secondary | ICD-10-CM | POA: Diagnosis not present

## 2017-12-25 ENCOUNTER — Other Ambulatory Visit: Payer: Self-pay

## 2017-12-25 ENCOUNTER — Ambulatory Visit: Payer: Medicare HMO | Admitting: Family Medicine

## 2017-12-25 DIAGNOSIS — I69320 Aphasia following cerebral infarction: Secondary | ICD-10-CM | POA: Diagnosis not present

## 2017-12-25 DIAGNOSIS — N183 Chronic kidney disease, stage 3 (moderate): Secondary | ICD-10-CM | POA: Diagnosis not present

## 2017-12-25 DIAGNOSIS — E1122 Type 2 diabetes mellitus with diabetic chronic kidney disease: Secondary | ICD-10-CM | POA: Diagnosis not present

## 2017-12-25 DIAGNOSIS — M47816 Spondylosis without myelopathy or radiculopathy, lumbar region: Secondary | ICD-10-CM | POA: Diagnosis not present

## 2017-12-25 DIAGNOSIS — R569 Unspecified convulsions: Secondary | ICD-10-CM | POA: Diagnosis not present

## 2017-12-25 DIAGNOSIS — I69351 Hemiplegia and hemiparesis following cerebral infarction affecting right dominant side: Secondary | ICD-10-CM | POA: Diagnosis not present

## 2017-12-25 DIAGNOSIS — J45909 Unspecified asthma, uncomplicated: Secondary | ICD-10-CM | POA: Diagnosis not present

## 2017-12-25 DIAGNOSIS — I129 Hypertensive chronic kidney disease with stage 1 through stage 4 chronic kidney disease, or unspecified chronic kidney disease: Secondary | ICD-10-CM | POA: Diagnosis not present

## 2017-12-25 DIAGNOSIS — I69398 Other sequelae of cerebral infarction: Secondary | ICD-10-CM | POA: Diagnosis not present

## 2017-12-25 NOTE — Patient Outreach (Signed)
[facility]) Care Management  12/25/2017  Kathleen Jensen 02-06-1951 [id number]   BSW contacted patients daughter, [name], to discuss the status of patients transportation application through [facility]. HIPAA identifiers verified. BSW shared information that this BSW recevied through the [place] office. Application is still in process due to the  supervisor being on vacation. Representative informed this BSW application should be processed by early next week.   [name] stated that patient has an appointment scheduled for June 14 and will utilize her final ride benefit through [place]. [name] also shared that patient has two other appointments in June on the 20th and 26th. Patient will need transportation to these appointments. BSW will update [name] on application status in the coming week. If patient placed on a waiting list BSW will assist [name] with arranging transportation through other resources.  During the call [name] confirmed receiving the list of in-home aide services mailed to patient by this BSW. [name] has been in contact with [name] and patient has been assessed to establish a plan of care. [name] is awaiting approval from patients long-term care policy to begin this service. [name] verbalized she would contact patients long-term care policy to inquire the status of this claim.  Plan: BSW to follow-up with [facility] in the next week and update [name] on patients application status.  [name], [name], CDP Social Worker [phone]

## 2017-12-26 ENCOUNTER — Ambulatory Visit: Payer: Self-pay | Admitting: *Deleted

## 2017-12-26 DIAGNOSIS — J45909 Unspecified asthma, uncomplicated: Secondary | ICD-10-CM | POA: Diagnosis not present

## 2017-12-26 DIAGNOSIS — E1122 Type 2 diabetes mellitus with diabetic chronic kidney disease: Secondary | ICD-10-CM | POA: Diagnosis not present

## 2017-12-26 DIAGNOSIS — M47816 Spondylosis without myelopathy or radiculopathy, lumbar region: Secondary | ICD-10-CM | POA: Diagnosis not present

## 2017-12-26 DIAGNOSIS — I129 Hypertensive chronic kidney disease with stage 1 through stage 4 chronic kidney disease, or unspecified chronic kidney disease: Secondary | ICD-10-CM | POA: Diagnosis not present

## 2017-12-26 DIAGNOSIS — I69351 Hemiplegia and hemiparesis following cerebral infarction affecting right dominant side: Secondary | ICD-10-CM | POA: Diagnosis not present

## 2017-12-26 DIAGNOSIS — I69320 Aphasia following cerebral infarction: Secondary | ICD-10-CM | POA: Diagnosis not present

## 2017-12-26 DIAGNOSIS — N183 Chronic kidney disease, stage 3 (moderate): Secondary | ICD-10-CM | POA: Diagnosis not present

## 2017-12-26 DIAGNOSIS — R569 Unspecified convulsions: Secondary | ICD-10-CM | POA: Diagnosis not present

## 2017-12-26 DIAGNOSIS — I69398 Other sequelae of cerebral infarction: Secondary | ICD-10-CM | POA: Diagnosis not present

## 2017-12-26 NOTE — Telephone Encounter (Signed)
Shanda BumpsJessica, nurse with [facility] calling to report pt BP 180/100 ,P-78, O2-98% and has no temp. Pt is asymptomatic at this time with no pain and no blurry vision. Pt has been taking amlodipine 5mg  and losartan 100mg  daily and has not missed any doses. Pt's BP has been ranging between 154/96, 142/74, 180/102 and 178/108 over the past week. Pt has nurse visits 3-4 times per week which is when the pt's BP is checked. Shanda BumpsJessica asking if on call physician could be contacted in order to get an increase in the pt's amlodipine and have prn visits to have the pt's BP rechecked to make sure she is adjusting to the new dosage. Conference call initiated with Shanda BumpsJessica and on call provider Dr. Clent RidgesFry.   Reason for Disposition . Systolic BP  >= 160 OR Diastolic >= 100  Protocols used: HIGH BLOOD PRESSURE-A-AH

## 2017-12-27 DIAGNOSIS — J45909 Unspecified asthma, uncomplicated: Secondary | ICD-10-CM | POA: Diagnosis not present

## 2017-12-27 DIAGNOSIS — E1122 Type 2 diabetes mellitus with diabetic chronic kidney disease: Secondary | ICD-10-CM | POA: Diagnosis not present

## 2017-12-27 DIAGNOSIS — I69351 Hemiplegia and hemiparesis following cerebral infarction affecting right dominant side: Secondary | ICD-10-CM | POA: Diagnosis not present

## 2017-12-27 DIAGNOSIS — N183 Chronic kidney disease, stage 3 (moderate): Secondary | ICD-10-CM | POA: Diagnosis not present

## 2017-12-27 DIAGNOSIS — I69398 Other sequelae of cerebral infarction: Secondary | ICD-10-CM | POA: Diagnosis not present

## 2017-12-27 DIAGNOSIS — I129 Hypertensive chronic kidney disease with stage 1 through stage 4 chronic kidney disease, or unspecified chronic kidney disease: Secondary | ICD-10-CM | POA: Diagnosis not present

## 2017-12-27 DIAGNOSIS — R569 Unspecified convulsions: Secondary | ICD-10-CM | POA: Diagnosis not present

## 2017-12-27 DIAGNOSIS — I69320 Aphasia following cerebral infarction: Secondary | ICD-10-CM | POA: Diagnosis not present

## 2017-12-27 DIAGNOSIS — M47816 Spondylosis without myelopathy or radiculopathy, lumbar region: Secondary | ICD-10-CM | POA: Diagnosis not present

## 2017-12-29 ENCOUNTER — Telehealth: Payer: Self-pay | Admitting: *Deleted

## 2017-12-29 DIAGNOSIS — N183 Chronic kidney disease, stage 3 (moderate): Secondary | ICD-10-CM | POA: Diagnosis not present

## 2017-12-29 DIAGNOSIS — I129 Hypertensive chronic kidney disease with stage 1 through stage 4 chronic kidney disease, or unspecified chronic kidney disease: Secondary | ICD-10-CM | POA: Diagnosis not present

## 2017-12-29 DIAGNOSIS — I69351 Hemiplegia and hemiparesis following cerebral infarction affecting right dominant side: Secondary | ICD-10-CM | POA: Diagnosis not present

## 2017-12-29 DIAGNOSIS — I69398 Other sequelae of cerebral infarction: Secondary | ICD-10-CM | POA: Diagnosis not present

## 2017-12-29 DIAGNOSIS — I69320 Aphasia following cerebral infarction: Secondary | ICD-10-CM | POA: Diagnosis not present

## 2017-12-29 DIAGNOSIS — E1122 Type 2 diabetes mellitus with diabetic chronic kidney disease: Secondary | ICD-10-CM | POA: Diagnosis not present

## 2017-12-29 DIAGNOSIS — R569 Unspecified convulsions: Secondary | ICD-10-CM | POA: Diagnosis not present

## 2017-12-29 DIAGNOSIS — J45909 Unspecified asthma, uncomplicated: Secondary | ICD-10-CM | POA: Diagnosis not present

## 2017-12-29 DIAGNOSIS — M47816 Spondylosis without myelopathy or radiculopathy, lumbar region: Secondary | ICD-10-CM | POA: Diagnosis not present

## 2017-12-29 NOTE — Telephone Encounter (Signed)
Received Physician Orders from [employer]; forwarded to provider/SLS 06/03

## 2017-12-30 ENCOUNTER — Other Ambulatory Visit: Payer: Self-pay

## 2017-12-30 DIAGNOSIS — M47816 Spondylosis without myelopathy or radiculopathy, lumbar region: Secondary | ICD-10-CM | POA: Diagnosis not present

## 2017-12-30 DIAGNOSIS — I69320 Aphasia following cerebral infarction: Secondary | ICD-10-CM | POA: Diagnosis not present

## 2017-12-30 DIAGNOSIS — J45909 Unspecified asthma, uncomplicated: Secondary | ICD-10-CM | POA: Diagnosis not present

## 2017-12-30 DIAGNOSIS — I69351 Hemiplegia and hemiparesis following cerebral infarction affecting right dominant side: Secondary | ICD-10-CM | POA: Diagnosis not present

## 2017-12-30 DIAGNOSIS — N183 Chronic kidney disease, stage 3 (moderate): Secondary | ICD-10-CM | POA: Diagnosis not present

## 2017-12-30 DIAGNOSIS — E1122 Type 2 diabetes mellitus with diabetic chronic kidney disease: Secondary | ICD-10-CM | POA: Diagnosis not present

## 2017-12-30 DIAGNOSIS — I129 Hypertensive chronic kidney disease with stage 1 through stage 4 chronic kidney disease, or unspecified chronic kidney disease: Secondary | ICD-10-CM | POA: Diagnosis not present

## 2017-12-30 DIAGNOSIS — R569 Unspecified convulsions: Secondary | ICD-10-CM | POA: Diagnosis not present

## 2017-12-30 DIAGNOSIS — I69398 Other sequelae of cerebral infarction: Secondary | ICD-10-CM | POA: Diagnosis not present

## 2017-12-30 NOTE — Patient Outreach (Signed)
[facility]) Care Management  12/30/2017  Kathleen ChickRovena Jensen 08/01/1950 [id number]  BSW recevied voice message from patients daughter, [name], indicating she received a phone call from [place] [place] [facility] stating patient did not qualify for transportation benefits due to residence. TAMS representative instructed patients daughter to contact [place] [employer]. BSW returned call to [facility] left HIPAA compliant voice message indicating this BSW would follow up on reason for denial as well as other resources. BSW indicated in voice message this BSW would call [name] back once able to distinguish best referral source for patient.  BSW contacted [employer] and was informed that program only services patients who live within [place] city limits. It was disclosed that [employer] is a 5 vehicle fleet that is a Chiropractor"response-demand" service. It was explained that patient would not be guaranteed a ride when needed due to high demand and low number of vehicles. [employer] does not transport outside of city limits.  BSW contacted [facility] to follow up on patients denial reason. BSW explained [place] and [employer] unable to accommodate. Representative stated she would locate application and reprocess since [employer] unable to service patient. BSW provided representative with contact information for both the patient and patients daughter.  BSW also contacted [employer] to inquire possibility of increasing patients transportation benefit. Spoke with Occupational psychologistcustomer service representative, [name]. Irving Burtonmily informed this BSW that [employer] in unable to over-ride patients transportation benefit. However, the transportation vendor Passenger transport manager([employer]) may be able to increase patients benefits. Irving Burtonmily indicated [employer] will assess increase on a "case by case basis".  BSW spoke with [name] and update on above information. [name] to contact this BSW in  the next few days if she has not received a call from [name]. BSW to continue outreach to [employer] to inquire increasing patients transportation benefits.   [name], Kenard GowerBSW, CDP Social Worker [phone]

## 2017-12-31 DIAGNOSIS — M47816 Spondylosis without myelopathy or radiculopathy, lumbar region: Secondary | ICD-10-CM | POA: Diagnosis not present

## 2017-12-31 DIAGNOSIS — I69320 Aphasia following cerebral infarction: Secondary | ICD-10-CM | POA: Diagnosis not present

## 2017-12-31 DIAGNOSIS — I69351 Hemiplegia and hemiparesis following cerebral infarction affecting right dominant side: Secondary | ICD-10-CM | POA: Diagnosis not present

## 2017-12-31 DIAGNOSIS — J45909 Unspecified asthma, uncomplicated: Secondary | ICD-10-CM | POA: Diagnosis not present

## 2017-12-31 DIAGNOSIS — N183 Chronic kidney disease, stage 3 (moderate): Secondary | ICD-10-CM | POA: Diagnosis not present

## 2017-12-31 DIAGNOSIS — E1122 Type 2 diabetes mellitus with diabetic chronic kidney disease: Secondary | ICD-10-CM | POA: Diagnosis not present

## 2017-12-31 DIAGNOSIS — R569 Unspecified convulsions: Secondary | ICD-10-CM | POA: Diagnosis not present

## 2017-12-31 DIAGNOSIS — I129 Hypertensive chronic kidney disease with stage 1 through stage 4 chronic kidney disease, or unspecified chronic kidney disease: Secondary | ICD-10-CM | POA: Diagnosis not present

## 2017-12-31 DIAGNOSIS — I69398 Other sequelae of cerebral infarction: Secondary | ICD-10-CM | POA: Diagnosis not present

## 2018-01-01 ENCOUNTER — Telehealth: Payer: Self-pay | Admitting: Family Medicine

## 2018-01-01 ENCOUNTER — Ambulatory Visit: Payer: Self-pay

## 2018-01-01 DIAGNOSIS — N183 Chronic kidney disease, stage 3 (moderate): Secondary | ICD-10-CM | POA: Diagnosis not present

## 2018-01-01 DIAGNOSIS — I69398 Other sequelae of cerebral infarction: Secondary | ICD-10-CM | POA: Diagnosis not present

## 2018-01-01 DIAGNOSIS — I129 Hypertensive chronic kidney disease with stage 1 through stage 4 chronic kidney disease, or unspecified chronic kidney disease: Secondary | ICD-10-CM | POA: Diagnosis not present

## 2018-01-01 DIAGNOSIS — I69320 Aphasia following cerebral infarction: Secondary | ICD-10-CM | POA: Diagnosis not present

## 2018-01-01 DIAGNOSIS — E1122 Type 2 diabetes mellitus with diabetic chronic kidney disease: Secondary | ICD-10-CM | POA: Diagnosis not present

## 2018-01-01 DIAGNOSIS — M47816 Spondylosis without myelopathy or radiculopathy, lumbar region: Secondary | ICD-10-CM | POA: Diagnosis not present

## 2018-01-01 DIAGNOSIS — J45909 Unspecified asthma, uncomplicated: Secondary | ICD-10-CM | POA: Diagnosis not present

## 2018-01-01 DIAGNOSIS — I69351 Hemiplegia and hemiparesis following cerebral infarction affecting right dominant side: Secondary | ICD-10-CM | POA: Diagnosis not present

## 2018-01-01 DIAGNOSIS — R569 Unspecified convulsions: Secondary | ICD-10-CM | POA: Diagnosis not present

## 2018-01-01 NOTE — Telephone Encounter (Signed)
Copied from CRM [id number]. Topic: General - Other >> Jan 01, 2018  3:05 PM Gerrianne ScalePayne, Cristiana Yochim L wrote: Reason for CRM: OT Mindi JunkerMarsha from [facility]  [phone] calling for verbal order for extended occupational Therapy   2 times a week for 3 weeks should start on  the 10th

## 2018-01-02 ENCOUNTER — Telehealth: Payer: Self-pay | Admitting: Family Medicine

## 2018-01-02 ENCOUNTER — Other Ambulatory Visit: Payer: Self-pay

## 2018-01-02 ENCOUNTER — Telehealth: Payer: Self-pay | Admitting: *Deleted

## 2018-01-02 DIAGNOSIS — K219 Gastro-esophageal reflux disease without esophagitis: Secondary | ICD-10-CM | POA: Diagnosis not present

## 2018-01-02 DIAGNOSIS — R4702 Dysphasia: Secondary | ICD-10-CM | POA: Diagnosis not present

## 2018-01-02 DIAGNOSIS — G40109 Localization-related (focal) (partial) symptomatic epilepsy and epileptic syndromes with simple partial seizures, not intractable, without status epilepticus: Secondary | ICD-10-CM | POA: Diagnosis not present

## 2018-01-02 DIAGNOSIS — R4701 Aphasia: Secondary | ICD-10-CM | POA: Diagnosis not present

## 2018-01-02 DIAGNOSIS — R471 Dysarthria and anarthria: Secondary | ICD-10-CM | POA: Diagnosis not present

## 2018-01-02 DIAGNOSIS — F329 Major depressive disorder, single episode, unspecified: Secondary | ICD-10-CM | POA: Diagnosis not present

## 2018-01-02 DIAGNOSIS — I69398 Other sequelae of cerebral infarction: Secondary | ICD-10-CM | POA: Diagnosis not present

## 2018-01-02 DIAGNOSIS — E039 Hypothyroidism, unspecified: Secondary | ICD-10-CM | POA: Diagnosis not present

## 2018-01-02 DIAGNOSIS — R4781 Slurred speech: Secondary | ICD-10-CM | POA: Diagnosis not present

## 2018-01-02 DIAGNOSIS — I639 Cerebral infarction, unspecified: Secondary | ICD-10-CM

## 2018-01-02 DIAGNOSIS — Z86711 Personal history of pulmonary embolism: Secondary | ICD-10-CM | POA: Diagnosis not present

## 2018-01-02 DIAGNOSIS — E785 Hyperlipidemia, unspecified: Secondary | ICD-10-CM | POA: Diagnosis not present

## 2018-01-02 DIAGNOSIS — I69320 Aphasia following cerebral infarction: Secondary | ICD-10-CM | POA: Diagnosis not present

## 2018-01-02 DIAGNOSIS — R410 Disorientation, unspecified: Secondary | ICD-10-CM | POA: Diagnosis not present

## 2018-01-02 DIAGNOSIS — E119 Type 2 diabetes mellitus without complications: Secondary | ICD-10-CM | POA: Diagnosis not present

## 2018-01-02 DIAGNOSIS — R569 Unspecified convulsions: Secondary | ICD-10-CM | POA: Diagnosis not present

## 2018-01-02 DIAGNOSIS — I10 Essential (primary) hypertension: Secondary | ICD-10-CM | POA: Diagnosis not present

## 2018-01-02 DIAGNOSIS — I69351 Hemiplegia and hemiparesis following cerebral infarction affecting right dominant side: Secondary | ICD-10-CM | POA: Diagnosis not present

## 2018-01-02 DIAGNOSIS — R402441 Other coma, without documented Glasgow coma scale score, or with partial score reported, in the field [EMT or ambulance]: Secondary | ICD-10-CM | POA: Diagnosis not present

## 2018-01-02 DIAGNOSIS — R918 Other nonspecific abnormal finding of lung field: Secondary | ICD-10-CM | POA: Diagnosis not present

## 2018-01-02 DIAGNOSIS — Z09 Encounter for follow-up examination after completed treatment for conditions other than malignant neoplasm: Secondary | ICD-10-CM | POA: Diagnosis not present

## 2018-01-02 DIAGNOSIS — R008 Other abnormalities of heart beat: Secondary | ICD-10-CM | POA: Diagnosis not present

## 2018-01-02 NOTE — Telephone Encounter (Signed)
Copied from CRM [id number]. Topic: Inquiry >> Jan 02, 2018 10:43 AM Leafy Roobinson, [name] wrote: Reason for CRM: Mindi Junkermarsha OT is calling from [facility] and would like extended OT orders for twice a wk for 3 wks . Verbal is ok

## 2018-01-02 NOTE — Patient Outreach (Signed)
[facility]) Care Management  01/02/2018  Ethelda ChickRovena Brashear Sep 28, 1950 [id number]   BSW was able to make contact with a [employer] representative Rolland Bimler(Alysha) to inquire about possibility of increasing patients allotted number of rides. After an extensive phone conversation, review of policies, and support from supervisor, Alysha determined [employer] in unable to increase the number of rides patient is allotted. Alysha suggested contacting payor source again as they are essentially able to determine more rides are needed.  Bevelyn NgoKendra Deniya Craigo, Kenard GowerBSW, CDP Social Worker [phone]

## 2018-01-02 NOTE — Telephone Encounter (Signed)
Received Physician Orders from [facility]; forwarded to provider/SLS 06/07

## 2018-01-02 NOTE — Telephone Encounter (Signed)
Ok to give verbal 

## 2018-01-02 NOTE — Telephone Encounter (Signed)
Mindi JunkerMarsha, OT from [facility] would like OT orders for bid X 3 weeks, as well as an order for a droparm bedside commode to [facility]ed [facility] [phone], with demographic information included please per [facility].

## 2018-01-02 NOTE — Telephone Encounter (Signed)
Order placed and verbal given to [facility].  Mindi JunkerMarsha stated that patient needed a drop arm platform for bedside commode.

## 2018-01-02 NOTE — Patient Outreach (Signed)
[facility]) Care Management  01/02/2018  Kathleen ChickRovena Jensen [birth year] [id number]  BSw contacted [facility] [place] [facility] office to inquire status of patients application being re-submitted. BSW spoke with representative, Tracie. Patients application had not yet been reviewed. BSW gave details of patients [place] and [place]. [name] stated she made notes regarding patients need for the [facility] [facility] service and would re-submit for review. BSW to call next week to determine if approved.  Bevelyn NgoKendra Aradia Estey, Kenard GowerBSW, CDP Social Worker [phone]

## 2018-01-02 NOTE — Telephone Encounter (Signed)
Copied from CRM [id number]. Topic: General - Other >> Jan 02, 2018 10:47 AM Leafy Roobinson, [name] wrote: Reason for CRM: Mindi Junkermarsha is calling again and patient needs drop arm platform bedside commode. [facility] [phone]. Pt send demographic infor

## 2018-01-02 NOTE — Telephone Encounter (Signed)
Verbal order given to [facility]

## 2018-01-03 DIAGNOSIS — R569 Unspecified convulsions: Secondary | ICD-10-CM | POA: Diagnosis not present

## 2018-01-03 DIAGNOSIS — R471 Dysarthria and anarthria: Secondary | ICD-10-CM | POA: Diagnosis not present

## 2018-01-03 DIAGNOSIS — M4800 Spinal stenosis, site unspecified: Secondary | ICD-10-CM | POA: Diagnosis not present

## 2018-01-03 DIAGNOSIS — E119 Type 2 diabetes mellitus without complications: Secondary | ICD-10-CM | POA: Diagnosis not present

## 2018-01-03 DIAGNOSIS — I10 Essential (primary) hypertension: Secondary | ICD-10-CM | POA: Diagnosis not present

## 2018-01-03 DIAGNOSIS — E039 Hypothyroidism, unspecified: Secondary | ICD-10-CM | POA: Diagnosis not present

## 2018-01-03 DIAGNOSIS — R008 Other abnormalities of heart beat: Secondary | ICD-10-CM | POA: Diagnosis not present

## 2018-01-03 DIAGNOSIS — E785 Hyperlipidemia, unspecified: Secondary | ICD-10-CM | POA: Diagnosis not present

## 2018-01-03 DIAGNOSIS — I69398 Other sequelae of cerebral infarction: Secondary | ICD-10-CM | POA: Diagnosis not present

## 2018-01-03 DIAGNOSIS — I69351 Hemiplegia and hemiparesis following cerebral infarction affecting right dominant side: Secondary | ICD-10-CM | POA: Diagnosis not present

## 2018-01-03 DIAGNOSIS — R4701 Aphasia: Secondary | ICD-10-CM | POA: Diagnosis not present

## 2018-01-03 MED ORDER — MIRTAZAPINE 15 MG PO TABS
15.00 | ORAL_TABLET | ORAL | Status: DC
Start: ? — End: 2018-01-03

## 2018-01-03 MED ORDER — AMLODIPINE BESYLATE 5 MG PO TABS
5.00 | ORAL_TABLET | ORAL | Status: DC
Start: 2018-01-04 — End: 2018-01-03

## 2018-01-03 MED ORDER — PANTOPRAZOLE SODIUM 40 MG PO TBEC
40.00 | DELAYED_RELEASE_TABLET | ORAL | Status: DC
Start: 2018-01-04 — End: 2018-01-03

## 2018-01-03 MED ORDER — WARFARIN SODIUM 7.5 MG PO TABS
7.50 | ORAL_TABLET | ORAL | Status: DC
Start: 2018-01-03 — End: 2018-01-03

## 2018-01-03 MED ORDER — GENERIC EXTERNAL MEDICATION
1.00 | Status: DC
Start: 2018-01-04 — End: 2018-01-03

## 2018-01-03 MED ORDER — POLYETHYLENE GLYCOL 3350 17 G PO PACK
17.00 g | PACK | ORAL | Status: DC
Start: 2018-01-05 — End: 2018-01-03

## 2018-01-03 MED ORDER — CALCIUM CARBONATE 1250 (500 CA) MG PO TABS
1250.00 | ORAL_TABLET | ORAL | Status: DC
Start: 2018-01-03 — End: 2018-01-03

## 2018-01-03 MED ORDER — LACOSAMIDE 100 MG PO TABS
100.00 | ORAL_TABLET | ORAL | Status: DC
Start: 2018-01-03 — End: 2018-01-03

## 2018-01-03 MED ORDER — LEVOTHYROXINE SODIUM 100 MCG PO TABS
100.00 | ORAL_TABLET | ORAL | Status: DC
Start: 2018-01-04 — End: 2018-01-03

## 2018-01-03 MED ORDER — ACETAMINOPHEN 325 MG PO TABS
650.00 | ORAL_TABLET | ORAL | Status: DC
Start: ? — End: 2018-01-03

## 2018-01-03 MED ORDER — GENERIC EXTERNAL MEDICATION
110.00 | Status: DC
Start: ? — End: 2018-01-03

## 2018-01-03 MED ORDER — LOSARTAN POTASSIUM 50 MG PO TABS
100.00 | ORAL_TABLET | ORAL | Status: DC
Start: ? — End: 2018-01-03

## 2018-01-03 MED ORDER — PHENYTOIN SODIUM EXTENDED 100 MG PO CAPS
100.00 | ORAL_CAPSULE | ORAL | Status: DC
Start: 2018-01-03 — End: 2018-01-03

## 2018-01-03 MED ORDER — MAGNESIUM OXIDE 400 MG PO TABS
400.00 | ORAL_TABLET | ORAL | Status: DC
Start: 2018-01-03 — End: 2018-01-03

## 2018-01-03 MED ORDER — MELATONIN 3 MG PO TABS
6.00 | ORAL_TABLET | ORAL | Status: DC
Start: ? — End: 2018-01-03

## 2018-01-03 MED ORDER — VITAMIN B-12 1000 MCG PO TABS
1000.00 | ORAL_TABLET | ORAL | Status: DC
Start: 2018-01-04 — End: 2018-01-03

## 2018-01-03 MED ORDER — GABAPENTIN 100 MG PO CAPS
200.00 | ORAL_CAPSULE | ORAL | Status: DC
Start: 2018-01-03 — End: 2018-01-03

## 2018-01-03 MED ORDER — LEVETIRACETAM 500 MG PO TABS
2000.00 | ORAL_TABLET | ORAL | Status: DC
Start: 2018-01-03 — End: 2018-01-03

## 2018-01-04 DIAGNOSIS — E1122 Type 2 diabetes mellitus with diabetic chronic kidney disease: Secondary | ICD-10-CM | POA: Diagnosis not present

## 2018-01-04 DIAGNOSIS — R569 Unspecified convulsions: Secondary | ICD-10-CM | POA: Diagnosis not present

## 2018-01-04 DIAGNOSIS — M47816 Spondylosis without myelopathy or radiculopathy, lumbar region: Secondary | ICD-10-CM | POA: Diagnosis not present

## 2018-01-04 DIAGNOSIS — I69398 Other sequelae of cerebral infarction: Secondary | ICD-10-CM | POA: Diagnosis not present

## 2018-01-04 DIAGNOSIS — I69320 Aphasia following cerebral infarction: Secondary | ICD-10-CM | POA: Diagnosis not present

## 2018-01-04 DIAGNOSIS — I129 Hypertensive chronic kidney disease with stage 1 through stage 4 chronic kidney disease, or unspecified chronic kidney disease: Secondary | ICD-10-CM | POA: Diagnosis not present

## 2018-01-04 DIAGNOSIS — I69351 Hemiplegia and hemiparesis following cerebral infarction affecting right dominant side: Secondary | ICD-10-CM | POA: Diagnosis not present

## 2018-01-04 DIAGNOSIS — N183 Chronic kidney disease, stage 3 (moderate): Secondary | ICD-10-CM | POA: Diagnosis not present

## 2018-01-04 DIAGNOSIS — J45909 Unspecified asthma, uncomplicated: Secondary | ICD-10-CM | POA: Diagnosis not present

## 2018-01-05 ENCOUNTER — Ambulatory Visit: Payer: Medicare HMO

## 2018-01-05 DIAGNOSIS — N183 Chronic kidney disease, stage 3 (moderate): Secondary | ICD-10-CM | POA: Diagnosis not present

## 2018-01-05 DIAGNOSIS — I69320 Aphasia following cerebral infarction: Secondary | ICD-10-CM | POA: Diagnosis not present

## 2018-01-05 DIAGNOSIS — M47816 Spondylosis without myelopathy or radiculopathy, lumbar region: Secondary | ICD-10-CM | POA: Diagnosis not present

## 2018-01-05 DIAGNOSIS — E1122 Type 2 diabetes mellitus with diabetic chronic kidney disease: Secondary | ICD-10-CM | POA: Diagnosis not present

## 2018-01-05 DIAGNOSIS — I129 Hypertensive chronic kidney disease with stage 1 through stage 4 chronic kidney disease, or unspecified chronic kidney disease: Secondary | ICD-10-CM | POA: Diagnosis not present

## 2018-01-05 DIAGNOSIS — I69351 Hemiplegia and hemiparesis following cerebral infarction affecting right dominant side: Secondary | ICD-10-CM | POA: Diagnosis not present

## 2018-01-05 DIAGNOSIS — I69398 Other sequelae of cerebral infarction: Secondary | ICD-10-CM | POA: Diagnosis not present

## 2018-01-05 DIAGNOSIS — J45909 Unspecified asthma, uncomplicated: Secondary | ICD-10-CM | POA: Diagnosis not present

## 2018-01-05 DIAGNOSIS — R569 Unspecified convulsions: Secondary | ICD-10-CM | POA: Diagnosis not present

## 2018-01-06 ENCOUNTER — Other Ambulatory Visit: Payer: Self-pay

## 2018-01-06 DIAGNOSIS — N183 Chronic kidney disease, stage 3 (moderate): Secondary | ICD-10-CM | POA: Diagnosis not present

## 2018-01-06 DIAGNOSIS — I69351 Hemiplegia and hemiparesis following cerebral infarction affecting right dominant side: Secondary | ICD-10-CM | POA: Diagnosis not present

## 2018-01-06 DIAGNOSIS — R569 Unspecified convulsions: Secondary | ICD-10-CM | POA: Diagnosis not present

## 2018-01-06 DIAGNOSIS — I69398 Other sequelae of cerebral infarction: Secondary | ICD-10-CM | POA: Diagnosis not present

## 2018-01-06 DIAGNOSIS — I69320 Aphasia following cerebral infarction: Secondary | ICD-10-CM | POA: Diagnosis not present

## 2018-01-06 DIAGNOSIS — M47816 Spondylosis without myelopathy or radiculopathy, lumbar region: Secondary | ICD-10-CM | POA: Diagnosis not present

## 2018-01-06 DIAGNOSIS — I129 Hypertensive chronic kidney disease with stage 1 through stage 4 chronic kidney disease, or unspecified chronic kidney disease: Secondary | ICD-10-CM | POA: Diagnosis not present

## 2018-01-06 DIAGNOSIS — E1122 Type 2 diabetes mellitus with diabetic chronic kidney disease: Secondary | ICD-10-CM | POA: Diagnosis not present

## 2018-01-06 DIAGNOSIS — J45909 Unspecified asthma, uncomplicated: Secondary | ICD-10-CM | POA: Diagnosis not present

## 2018-01-06 NOTE — Patient Outreach (Signed)
[name]) Care Management  01/06/2018  Kathleen Jensen [birth year] [id number]  BSW received a voicemail from patients daughter informing this BSW of patients ED encounter on 6/7. Voice message indicated patient needed a follow-up with PCP this week and would need transportation. Patients daughter inquired on voice message of [facility] transportation as well as application status of [facility]. [facility] to inquire status of patients application. Representative informed this BSW she saw it in the system but could not confirm approval. BSW provided contact information requesting a return call once representative clarified services from supervisor.  BSW contacted patients daughter. HIPAA identifiers verified. Patient was instructed to follow up with PCP this week to repeat labwork. Patients daughter was unable to speak long due to actively transporting school aged kids on bus route. BSW reminded patients daughter that a three day notification was needed to provide wheelchair transport. Given that today is Tuesday BSW could only arrange transportation to a Friday appointment. Patients daughter was unable to contact PCP to request an appointment due to work. Patients daughter requested this BSW schedule an appointment for patient. Before end of call patients daughter remembered the patient has a hearing exam scheduled for Friday morning and requested BSW arrange PCP appointment for Monday 6/17.  BSW contacted patients PCP office and scheduled patients appointment for Monday 6/17 at 11:30am. BSW arranged transportation through [employer]. BSW left voice message updating patients daughter of scheduled date and time. BSW also mailed an application to patients home on today's date to apply for ADA para-transit through [facility]. This service will only transport patient within city limits and requires an application process. BSW asked patients daughter to call  next week when available to discuss upcomming appointments and plans for transportation.  Bevelyn NgoKendra Merelyn Klump, Kenard GowerBSW, CDP Social Worker [phone]

## 2018-01-07 ENCOUNTER — Other Ambulatory Visit: Payer: Self-pay

## 2018-01-07 NOTE — Patient Outreach (Signed)
[facility]) Care Management  01/07/2018  Kathleen Jensen 09/28/1950 [id number]  BSW received an incoming call from patients daughter [name] to confirm understanding of patients PCP appointment being scheduled on Monday at 11:30 am. [name] able to verify HIPAA. During the call [name] confirmed upcomming June appointments on 6/20 at 1:45 pm for an ultrasound followed by a 2:30 pm appointment with Dr. Shawnee KnappLevy. Both of these appointments will [facility] Patient also has an appointment on 6/26 at 11:30 with [facility] neurologist [facility]. BSW to contact [place] [facility] in the next week to inquire if patient able to utilize resource for these appointments.  [name] confirmed patients long-term care policy has approved for patient to use [facility] for in home care. [name] plans to utilize this service minimally in efforts to preserve funds. At this time, [name] devotes most of her time to being a caregiver. When [name] is not available neighbors, church members, or [name] husband stay with the patient. [name] will most likely continue being patients sole caregiver and utilize [facility] on weekends. Although [name] is able to devote several hours each day to the patient, she stated that in August when they school year begins she will need alternative resources.  Patient is not willing to enter long-term care. BSW spoke with [name] about adult day centers. [name] very interested in learning more. BSW to gather information and mail to patient in the next week. [name] stated she and her husband have had discussions with the patient about selling her vehicle and purchasing a vehicle that is wheelchair accessible. The patient has been emotional since this discussion because she has been hopeful that her independence and ability to drive would return. [name] stated they are having to be careful  how conversations are approached and be supportive of the patients independence.  Plan: BSW to research adult day centers/programs BSW to contact [employer] regarding patients eligibility for service Ms. Franklin to complete ADA application for [facility] para-transit services. [name] to have patients PCP complete the physician section of application. BSW to contact patients daughter next week to update on transportation options for upcomming June appointments.  Bevelyn NgoKendra Denese Mentink, Kenard GowerBSW, CDP Social Worker [phone]

## 2018-01-08 DIAGNOSIS — J45909 Unspecified asthma, uncomplicated: Secondary | ICD-10-CM | POA: Diagnosis not present

## 2018-01-08 DIAGNOSIS — I69351 Hemiplegia and hemiparesis following cerebral infarction affecting right dominant side: Secondary | ICD-10-CM | POA: Diagnosis not present

## 2018-01-08 DIAGNOSIS — M47816 Spondylosis without myelopathy or radiculopathy, lumbar region: Secondary | ICD-10-CM | POA: Diagnosis not present

## 2018-01-08 DIAGNOSIS — I69398 Other sequelae of cerebral infarction: Secondary | ICD-10-CM | POA: Diagnosis not present

## 2018-01-08 DIAGNOSIS — E1122 Type 2 diabetes mellitus with diabetic chronic kidney disease: Secondary | ICD-10-CM | POA: Diagnosis not present

## 2018-01-08 DIAGNOSIS — I129 Hypertensive chronic kidney disease with stage 1 through stage 4 chronic kidney disease, or unspecified chronic kidney disease: Secondary | ICD-10-CM | POA: Diagnosis not present

## 2018-01-08 DIAGNOSIS — I69320 Aphasia following cerebral infarction: Secondary | ICD-10-CM | POA: Diagnosis not present

## 2018-01-08 DIAGNOSIS — N183 Chronic kidney disease, stage 3 (moderate): Secondary | ICD-10-CM | POA: Diagnosis not present

## 2018-01-08 DIAGNOSIS — R569 Unspecified convulsions: Secondary | ICD-10-CM | POA: Diagnosis not present

## 2018-01-08 MED FILL — WARFARIN SODIUM 5 MG TABLET: 5 | 7 days supply | Qty: 9 | Fill #1

## 2018-01-09 ENCOUNTER — Other Ambulatory Visit: Payer: Self-pay

## 2018-01-09 ENCOUNTER — Inpatient Hospital Stay: Payer: Medicare HMO | Admitting: Family Medicine

## 2018-01-09 ENCOUNTER — Ambulatory Visit: Payer: Self-pay

## 2018-01-09 DIAGNOSIS — N183 Chronic kidney disease, stage 3 (moderate): Secondary | ICD-10-CM | POA: Diagnosis not present

## 2018-01-09 DIAGNOSIS — I69320 Aphasia following cerebral infarction: Secondary | ICD-10-CM | POA: Diagnosis not present

## 2018-01-09 DIAGNOSIS — I69351 Hemiplegia and hemiparesis following cerebral infarction affecting right dominant side: Secondary | ICD-10-CM | POA: Diagnosis not present

## 2018-01-09 DIAGNOSIS — J45909 Unspecified asthma, uncomplicated: Secondary | ICD-10-CM | POA: Diagnosis not present

## 2018-01-09 DIAGNOSIS — I129 Hypertensive chronic kidney disease with stage 1 through stage 4 chronic kidney disease, or unspecified chronic kidney disease: Secondary | ICD-10-CM | POA: Diagnosis not present

## 2018-01-09 DIAGNOSIS — I69398 Other sequelae of cerebral infarction: Secondary | ICD-10-CM | POA: Diagnosis not present

## 2018-01-09 DIAGNOSIS — R6889 Other general symptoms and signs: Secondary | ICD-10-CM | POA: Diagnosis not present

## 2018-01-09 DIAGNOSIS — E1122 Type 2 diabetes mellitus with diabetic chronic kidney disease: Secondary | ICD-10-CM | POA: Diagnosis not present

## 2018-01-09 DIAGNOSIS — M47816 Spondylosis without myelopathy or radiculopathy, lumbar region: Secondary | ICD-10-CM | POA: Diagnosis not present

## 2018-01-09 DIAGNOSIS — R569 Unspecified convulsions: Secondary | ICD-10-CM | POA: Diagnosis not present

## 2018-01-09 DIAGNOSIS — H903 Sensorineural hearing loss, bilateral: Secondary | ICD-10-CM | POA: Diagnosis not present

## 2018-01-09 NOTE — Patient Outreach (Signed)
[facility]) Care Management  01/09/2018  Ethelda ChickRovena Bingman 08-13-1950 [id number]  BSW able to confirm with [facility] [employer]) that patients application has been approved. This agency is able to provide transportation to medical appointments outside of [facility] for the patient. BSW contacted patients daughter, [name] and left a HIPAA compliant voice message updating that patients application had been approved. BSW left contact information to [facility] for [name] to begin arranging transportation for upcomming appointments.  BSW will stay in contact with Mrs Susann GivensFranklin over the next two weeks to confirm services have been utilized and the patient is successful in maintaining physician appointments. Once patient is established BSW will plan to close case considering no other social work needs have been identified.   Bevelyn NgoKendra Analysa Nutting, Kenard GowerBSW, CDP Social Worker [phone]

## 2018-01-12 ENCOUNTER — Encounter: Payer: Self-pay | Admitting: Family Medicine

## 2018-01-12 ENCOUNTER — Ambulatory Visit (INDEPENDENT_AMBULATORY_CARE_PROVIDER_SITE_OTHER): Payer: Medicare HMO | Admitting: Family Medicine

## 2018-01-12 ENCOUNTER — Other Ambulatory Visit: Payer: Self-pay

## 2018-01-12 VITALS — BP 124/70 | HR 60 | Temp 98.0°F | Resp 16

## 2018-01-12 DIAGNOSIS — I129 Hypertensive chronic kidney disease with stage 1 through stage 4 chronic kidney disease, or unspecified chronic kidney disease: Secondary | ICD-10-CM | POA: Diagnosis not present

## 2018-01-12 DIAGNOSIS — J45909 Unspecified asthma, uncomplicated: Secondary | ICD-10-CM | POA: Diagnosis not present

## 2018-01-12 DIAGNOSIS — M792 Neuralgia and neuritis, unspecified: Secondary | ICD-10-CM | POA: Diagnosis not present

## 2018-01-12 DIAGNOSIS — M47816 Spondylosis without myelopathy or radiculopathy, lumbar region: Secondary | ICD-10-CM | POA: Diagnosis not present

## 2018-01-12 DIAGNOSIS — I10 Essential (primary) hypertension: Secondary | ICD-10-CM

## 2018-01-12 DIAGNOSIS — G40909 Epilepsy, unspecified, not intractable, without status epilepticus: Secondary | ICD-10-CM | POA: Diagnosis not present

## 2018-01-12 DIAGNOSIS — E039 Hypothyroidism, unspecified: Secondary | ICD-10-CM | POA: Diagnosis not present

## 2018-01-12 DIAGNOSIS — I639 Cerebral infarction, unspecified: Secondary | ICD-10-CM | POA: Diagnosis not present

## 2018-01-12 DIAGNOSIS — K219 Gastro-esophageal reflux disease without esophagitis: Secondary | ICD-10-CM | POA: Diagnosis not present

## 2018-01-12 DIAGNOSIS — I69351 Hemiplegia and hemiparesis following cerebral infarction affecting right dominant side: Secondary | ICD-10-CM | POA: Diagnosis not present

## 2018-01-12 DIAGNOSIS — R569 Unspecified convulsions: Secondary | ICD-10-CM | POA: Insufficient documentation

## 2018-01-12 DIAGNOSIS — I69398 Other sequelae of cerebral infarction: Secondary | ICD-10-CM | POA: Diagnosis not present

## 2018-01-12 DIAGNOSIS — E785 Hyperlipidemia, unspecified: Secondary | ICD-10-CM

## 2018-01-12 DIAGNOSIS — E1122 Type 2 diabetes mellitus with diabetic chronic kidney disease: Secondary | ICD-10-CM | POA: Diagnosis not present

## 2018-01-12 DIAGNOSIS — I69320 Aphasia following cerebral infarction: Secondary | ICD-10-CM | POA: Diagnosis not present

## 2018-01-12 DIAGNOSIS — I2699 Other pulmonary embolism without acute cor pulmonale: Secondary | ICD-10-CM

## 2018-01-12 DIAGNOSIS — N183 Chronic kidney disease, stage 3 (moderate): Secondary | ICD-10-CM | POA: Diagnosis not present

## 2018-01-12 LAB — TSH: TSH: 1.16 u[IU]/mL (ref 0.35–4.50)

## 2018-01-12 MED ORDER — LOSARTAN POTASSIUM 100 MG PO TABS: 100.0000 mg | ORAL_TABLET | Freq: Every day | ORAL | 3 refills | Status: DC

## 2018-01-12 MED ORDER — AMLODIPINE BESYLATE 5 MG PO TABS: 5.0000 mg | ORAL_TABLET | Freq: Every day | ORAL | 3 refills | Status: DC

## 2018-01-12 MED ORDER — LEVOTHYROXINE SODIUM 88 MCG PO TABS: 88.0000 ug | ORAL_TABLET | Freq: Every day | ORAL | 3 refills | Status: DC

## 2018-01-12 MED ORDER — PANTOPRAZOLE SODIUM 40 MG PO TBEC: 40.0000 mg | DELAYED_RELEASE_TABLET | Freq: Every day | ORAL | 3 refills | Status: DC

## 2018-01-12 MED ORDER — GABAPENTIN 100 MG PO CAPS: ORAL_CAPSULE | ORAL | 1 refills | Status: DC

## 2018-01-12 MED FILL — levETIRAcetam 1000 MG TABS: 1000 | 30 days supply | Qty: 120 | Fill #1

## 2018-01-12 MED FILL — GABAPENTIN 100 MG CAPSULE: 100 | 90 days supply | Qty: 360 | Fill #0

## 2018-01-12 MED FILL — WARFARIN SODIUM 5 MG TABLET: 5 | 35 days supply | Qty: 45 | Fill #2

## 2018-01-12 NOTE — Assessment & Plan Note (Signed)
Per neuro  Check dilantin today On keppra as well

## 2018-01-12 NOTE — Patient Outreach (Signed)
[facility]) Care Management  01/12/2018  Kathleen ChickRovena Jensen 23-Sep-1950 [id number]  BSW received a return call from patients daughter who confirmed [facility] [employer] confirmed they will transport patient to her upcoming June appointments.  Kathleen NgoKendra Savon Jensen, Kathleen GowerBSW, CDP Social Worker [phone]

## 2018-01-12 NOTE — Addendum Note (Signed)
Addended by: Thelma BargeICHARDSON, SHEKETIA D on: 01/12/2018 12:55 PM   Modules accepted: Orders

## 2018-01-12 NOTE — Assessment & Plan Note (Signed)
Well controlled, no changes to meds. Encouraged heart healthy diet such as the DASH diet and exercise as tolerated.  °

## 2018-01-12 NOTE — Patient Instructions (Signed)
Epilepsy °Epilepsy is a condition in which a person has repeated seizures over time. A seizure is a sudden burst of abnormal electrical and chemical activity in the brain. Seizures can cause a change in attention, behavior, or the ability to remain awake and alert (altered mental status). °Epilepsy increases a person's risk of falls, accidents, and injury. It can also lead to complications, including: °· Depression. °· Poor memory. °· Sudden unexplained death in epilepsy (SUDEP). This complication is rare, and its cause is not known. ° °Most people with epilepsy lead normal lives. °What are the causes? °This condition may be caused by: °· A head injury. °· An injury that happens at birth. °· A high fever during childhood. °· A stroke. °· Bleeding that goes into or around the brain. °· Certain medicines and drugs. °· Having too little oxygen for a long period of time. °· Abnormal brain development. °· Certain infections, such as meningitis and encephalitis. °· Brain tumors. °· Conditions that are passed along from parent to child (are hereditary). ° °What are the signs or symptoms? °Symptoms of a seizure vary greatly from person to person. They include: °· Convulsions. °· Stiffening of the body. °· Involuntary movements of the arms or legs. °· Loss of consciousness. °· Breathing problems. °· Falling suddenly. °· Confusion. °· Head nodding. °· Eye blinking or fluttering. °· Lip smacking. °· Drooling. °· Rapid eye movements. °· Grunting. °· Loss of bladder control and bowel control. °· Staring. °· Unresponsiveness. ° °Some people have symptoms right before a seizure happens (aura) and right after a seizure happens. Symptoms of an aura include: °· Fear or anxiety. °· Nausea. °· Feeling like the room is spinning (vertigo). °· A feeling of having seen or heard something before (deja vu). °· Odd tastes or smells. °· Changes in vision, such as seeing flashing lights or spots. ° °Symptoms that follow a seizure  include: °· Confusion. °· Sleepiness. °· Headache. ° °How is this diagnosed? °This condition is diagnosed based on: °· Your symptoms. °· Your medical history. °· A physical exam. °· A neurological exam. A neurological exam is similar to a physical exam. It involves checking your strength, reflexes, coordination, and sensations. °· Tests, such as: °? An electroencephalogram (EEG). This is a painless test that creates a diagram of your brain waves. °? An MRI of the brain. °? A CT scan of the brain. °? A lumbar puncture, also called a spinal tap. °? Blood tests to check for signs of infection or abnormal blood chemistry. ° °How is this treated? °There is no cure for this condition, but treatment can help control seizures. Treatment may involve: °· Taking medicines to control seizures. These include medicines to prevent seizures and medicines to stop seizures as they occur. °· Having a device called a vagus nerve stimulator implanted in the chest. The device sends electrical impulses to the vagus nerve and to the brain to prevent seizures. This treatment may be recommended if medicines do not help. °· Brain surgery. There are several kinds of surgeries that may be done to stop seizures from happening or to reduce how often seizures happen. °· Having regular blood tests. You may need to have blood tests regularly to check that you are getting the right amount of medicine. ° °Once this condition has been diagnosed, it is important to begin treatment as soon as possible. For some people, epilepsy eventually goes away. °Follow these instructions at home: °Medicines ° °· Take over-the-counter and prescription medicines only as   told by your health care provider. °· Avoid any substances that may prevent your medicine from working properly, such as alcohol. °Activity °· Get enough rest. Lack of sleep can make seizures more likely to occur. °· Follow instructions from your health care provider about driving, swimming, and doing  any other activities that would be dangerous if you had a seizure. °Educating others °Teach friends and family what to do if you have a seizure. They should: °· Lay you on the ground to prevent a fall. °· Cushion your head and body. °· Loosen any tight clothing around your neck. °· Turn you on your side. If vomiting occurs, this helps keep your airway clear. °· Stay with you until you recover. °· Not hold you down. Holding you down will not stop the seizure. °· Not put anything in your mouth. °· Know whether or not you need emergency care. ° °General instructions °· Avoid anything that has ever triggered a seizure for you. °· Keep a seizure diary. Record what you remember about each seizure, especially anything that might have triggered the seizure. °· Keep all follow-up visits as told by your health care provider. This is important. °Contact a health care provider if: °· Your seizure pattern changes. °· You have symptoms of infection or another illness. This might increase your risk of having a seizure. °Get help right away if: °· You have a seizure that does not stop after 5 minutes. °· You have several seizures in a row without a complete recovery in between seizures. °· You have a seizure that makes it harder to breathe. °· You have a seizure that is different from previous seizures. °· You have a seizure that leaves you unable to speak or use a part of your body. °· You did not wake up immediately after a seizure. °This information is not intended to replace advice given to you by your health care provider. Make sure you discuss any questions you have with your health care provider. °Document Released: 07/15/2005 Document Revised: 02/10/2016 Document Reviewed: 01/23/2016 °[employer] Interactive Patient Education © 2018 [employer] ° °

## 2018-01-12 NOTE — Progress Notes (Signed)
[name], female    DOB: Jan 13, 1951  Age: 67 y.o. MRN: [id number]    Subjective:  Subjective  HPI Kathleen Jensen presents for f/u ER for breakthrough seizure.  Her dilantin dose was adjusted and she was asked to come here for a recheck.  She also needs TSH checked today.  No further seizure activity since then.  Her daughter is with her as well and paperwork was brought in for medical transportation.  Her daughter is with her.  Review of Systems  Constitutional: Negative for appetite change, diaphoresis, fatigue and unexpected weight change.  Eyes: Negative for pain, redness and visual disturbance.  Respiratory: Negative for cough, chest tightness, shortness of breath and wheezing.   Cardiovascular: Negative for chest pain, palpitations and leg swelling.  Endocrine: Negative for cold intolerance, heat intolerance, polydipsia, polyphagia and polyuria.  Genitourinary: Negative for difficulty urinating, dysuria and frequency.  Neurological: Positive for seizures and weakness. Negative for dizziness, light-headedness, numbness and headaches.    History Past Medical History:  Diagnosis Date  . Arthritis   . 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 Hypertension     She has a past surgical history that includes Tonsillectomy; Gastric bypass (03/2007); Cervical spine surgery; Cesarean section; Breast biopsy (Right); Breast excisional biopsy; and Back surgery.   Her family history includes Cancer in her brother and sister; Diabetes in her father and mother; High blood pressure in her mother; Stroke in her father.She reports that she quit smoking about 38 years ago. She quit after 4.00 years of use. She has never used smokeless tobacco. She reports that she does not drink alcohol or use drugs.  Current Outpatient Medications on File Prior to Visit  Medication Sig Dispense Refill  . acetaminophen (TYLENOL) 325 MG tablet Take 1 tablet by  mouth every 8 (eight) hours as needed.    . calcium carbonate (OS-CAL - DOSED IN MG OF ELEMENTAL CALCIUM) 1250 (500 Ca) MG tablet 1 tablet daily.    Marland Kitchen lacosamide (VIMPAT) 200 MG TABS tablet Take 1 tablet (200 mg total) by mouth 2 (two) times daily. 60 tablet   . levETIRAcetam (KEPPRA) 1000 MG tablet 2 po bid 120 tablet 2  . magnesium oxide (MAG-OX) 400 MG tablet Take 400 mg by mouth 2 (two) times daily.    . mirtazapine (REMERON) 15 MG tablet Take 1 tablet by mouth at bedtime.    . phenytoin (DILANTIN) 50 MG tablet Chew 2 tablets (100 mg total) by mouth 3 (three) times daily. (Patient taking differently: Chew by mouth 3 (three) times daily. 200mg  in the morning and 200mg  in the evening)    . polyethylene glycol (MIRALAX / GLYCOLAX) packet Take 17 g by mouth daily as needed.     . senna-docusate (SENOKOT-S) 8.6-50 MG tablet Take 1 tablet by mouth 2 (two) times daily.    Marland Kitchen warfarin (COUMADIN) 5 MG tablet 10 mg on mon and 5 mg all other days 45 tablet 3   No current facility-administered medications on file prior to visit.      Objective:  Objective  Physical Exam  Constitutional: She is oriented to person, place, and time. She appears well-developed and well-nourished.  HENT:  Head: Normocephalic and atraumatic.  Eyes: Conjunctivae and EOM are normal.  Neck: Normal range of motion. Neck supple. No JVD present. Carotid bruit is not present. No thyromegaly present.  Cardiovascular: Normal rate, regular rhythm and normal heart sounds.  No murmur heard. Pulmonary/Chest:  Effort normal and breath sounds normal. No respiratory distress. She has no wheezes. She has no rales. She exhibits no tenderness.  Musculoskeletal: She exhibits no edema.  Neurological: She is alert and oriented to person, place, and time.  Rue and RLE weakness-- no change +aphasia  Psychiatric: She has a normal mood and affect.  Nursing note and vitals reviewed.  BP 124/70 (BP Location: Left Arm, Cuff Size: Normal)    Pulse 60   Temp 98 F (36.7 C) (Oral)   Resp 16   SpO2 99%  Wt Readings from Last 3 Encounters:  10/05/17 184 lb 8.4 oz (83.7 kg)  05/23/17 195 lb (88.5 kg)  04/28/17 197 lb (89.4 kg)     Lab Results  Component Value Date   WBC 4.3 12/08/2017   HGB 11.9 (L) 12/08/2017   HCT 35.3 (L) 12/08/2017   PLT 209.0 12/08/2017   GLUCOSE 75 10/15/2017   CHOL 147 10/06/2017   TRIG 64 10/06/2017   HDL 58 10/06/2017   LDLCALC 76 10/06/2017   ALT 11 (L) 10/15/2017   AST 17 10/15/2017   NA 140 10/15/2017   K 4.4 10/15/2017   CL 109 10/15/2017   CREATININE 0.66 10/15/2017   BUN 6 10/15/2017   CO2 24 10/15/2017   TSH 1.48 12/08/2017   INR 1.5 12/08/2017   HGBA1C 5.0 10/06/2017   MICROALBUR 1.0 01/26/2016    Ct Angio Head W Or Wo Contrast  Result Date: 10/05/2017 CLINICAL DATA:  Slurred speech.  Prior stroke. EXAM: CT ANGIOGRAPHY HEAD AND NECK CT PERFUSION BRAIN TECHNIQUE: Multidetector CT imaging of the head and neck was performed using the standard protocol during bolus administration of intravenous contrast. Multiplanar CT image reconstructions and MIPs were obtained to evaluate the vascular anatomy. Carotid stenosis measurements (when applicable) are obtained utilizing NASCET criteria, using the distal internal carotid diameter as the denominator. Multiphase CT imaging of the brain was performed following IV bolus contrast injection. Subsequent parametric perfusion maps were calculated using RAPID software. CONTRAST:  90mL ISOVUE-370 IOPAMIDOL (ISOVUE-370) INJECTION 76% COMPARISON:  None. FINDINGS: CTA NECK FINDINGS Aortic arch: Normal variant aortic arch branching pattern with common origin of brachiocephalic and left common carotid arteries. Mild arch atherosclerosis. Widely patent arch vessel origins. Right carotid system: Patent with mild calcified and soft plaque at the carotid bifurcation. No significant stenosis or evidence of dissection. Left carotid system: Patent with minimal  calcified plaque at the carotid bifurcation. No stenosis or evidence of dissection. Vertebral arteries: Patent and codominant without evidence of stenosis or dissection. Skeleton: Remote, solid interbody osseous fusion at C4-5. Bilateral C4-5 facet ankylosis. Multilevel disc degeneration, advanced at C5-6 and C6-7. Bulky posterior spurring/disc osteophyte complex at C5-6 results in moderate spinal stenosis. Other neck: No mass or enlarged lymph nodes. Upper chest: Clear lung apices. Review of the MIP images confirms the above findings CTA HEAD FINDINGS Anterior circulation: The internal carotid arteries are patent from skull base to carotid termini with mild nonstenotic siphon atherosclerosis bilaterally. ACAs and MCAs are patent without evidence of proximal branch occlusion or significant proximal stenosis. No aneurysm or vascular malformation. Posterior circulation: The intracranial vertebral arteries are widely patent to the basilar. PICAs and SCAs are unremarkable. AICAs are small and not well evaluated. The basilar artery is widely patent. There is a small right posterior communicating artery. PCAs are patent without evidence of significant stenosis. No aneurysm or vascular malformation. Venous sinuses: As permitted by contrast timing, patent. Anatomic variants: None. Delayed phase: Not performed. Review  of the MIP images confirms the above findings CT Brain Perfusion Findings: CBF (<30%) Volume: 0mL Perfusion (Tmax>6.0s) volume: 0mL Mismatch Volume: n/a Infarction Location: n/a There is increased cerebral blood volume and blood flow diffusely throughout cortex in the posterior left temporal, left occipital, and left parietal lobes in the setting of an old high left parietal cortical infarct. IMPRESSION: 1. No large vessel occlusion. 2. Mild intracranial and cervical atherosclerosis without significant stenosis or aneurysm. 3. No evidence of acute ischemia on perfusion imaging. 4. Increased cortical blood flow  throughout the posterior left cerebral hemisphere, query seizure activity in the setting of an old left parietal cortical infarct. These results were called by telephone at the time of interpretation on 10/05/2017 at 2:43 pm to Dr. Ritta SlotMCNEILL KIRKPATRICK , who verbally acknowledged these results. Electronically Signed   By: Sebastian AcheAllen  Grady M.D.   On: 10/05/2017 14:58   Ct Angio Neck W Or Wo Contrast  Result Date: 10/05/2017 CLINICAL DATA:  Slurred speech.  Prior stroke. EXAM: CT ANGIOGRAPHY HEAD AND NECK CT PERFUSION BRAIN TECHNIQUE: Multidetector CT imaging of the head and neck was performed using the standard protocol during bolus administration of intravenous contrast. Multiplanar CT image reconstructions and MIPs were obtained to evaluate the vascular anatomy. Carotid stenosis measurements (when applicable) are obtained utilizing NASCET criteria, using the distal internal carotid diameter as the denominator. Multiphase CT imaging of the brain was performed following IV bolus contrast injection. Subsequent parametric perfusion maps were calculated using RAPID software. CONTRAST:  90mL ISOVUE-370 IOPAMIDOL (ISOVUE-370) INJECTION 76% COMPARISON:  None. FINDINGS: CTA NECK FINDINGS Aortic arch: Normal variant aortic arch branching pattern with common origin of brachiocephalic and left common carotid arteries. Mild arch atherosclerosis. Widely patent arch vessel origins. Right carotid system: Patent with mild calcified and soft plaque at the carotid bifurcation. No significant stenosis or evidence of dissection. Left carotid system: Patent with minimal calcified plaque at the carotid bifurcation. No stenosis or evidence of dissection. Vertebral arteries: Patent and codominant without evidence of stenosis or dissection. Skeleton: Remote, solid interbody osseous fusion at C4-5. Bilateral C4-5 facet ankylosis. Multilevel disc degeneration, advanced at C5-6 and C6-7. Bulky posterior spurring/disc osteophyte complex at C5-6  results in moderate spinal stenosis. Other neck: No mass or enlarged lymph nodes. Upper chest: Clear lung apices. Review of the MIP images confirms the above findings CTA HEAD FINDINGS Anterior circulation: The internal carotid arteries are patent from skull base to carotid termini with mild nonstenotic siphon atherosclerosis bilaterally. ACAs and MCAs are patent without evidence of proximal branch occlusion or significant proximal stenosis. No aneurysm or vascular malformation. Posterior circulation: The intracranial vertebral arteries are widely patent to the basilar. PICAs and SCAs are unremarkable. AICAs are small and not well evaluated. The basilar artery is widely patent. There is a small right posterior communicating artery. PCAs are patent without evidence of significant stenosis. No aneurysm or vascular malformation. Venous sinuses: As permitted by contrast timing, patent. Anatomic variants: None. Delayed phase: Not performed. 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 IMPRESSION: 1. No large vessel occlusion. 2. Mild intracranial and cervical atherosclerosis without significant stenosis or aneurysm. 3. No evidence of acute ischemia on perfusion imaging. 4. Increased  cortical blood flow throughout the posterior left cerebral hemisphere, query seizure activity in the setting of an old left parietal cortical infarct. These results were called by telephone at the time of interpretation on 10/05/2017 at 2:43 pm to Dr. Ritta Slot , who verbally acknowledged these results. Electronically Signed   By: Sebastian Ache M.D.   On: 10/05/2017 14:58   Ct  Cerebral Perfusion W Contrast  Result Date: 10/05/2017 CLINICAL DATA:  Slurred speech.  Prior stroke. EXAM: CT ANGIOGRAPHY HEAD AND NECK CT PERFUSION BRAIN TECHNIQUE: Multidetector CT imaging of the head and neck was performed using the standard protocol during bolus administration of intravenous contrast. Multiplanar CT image reconstructions and MIPs were obtained to evaluate the vascular anatomy. Carotid stenosis measurements (when applicable) are obtained utilizing NASCET criteria, using the distal internal carotid diameter as the denominator. Multiphase CT imaging of the brain was performed following IV bolus contrast injection. Subsequent parametric perfusion maps were calculated using RAPID software. CONTRAST:  90mL ISOVUE-370 IOPAMIDOL (ISOVUE-370) INJECTION 76% COMPARISON:  None. FINDINGS: CTA NECK FINDINGS Aortic arch: Normal variant aortic arch branching pattern with common origin of brachiocephalic and left common carotid arteries. Mild arch atherosclerosis. Widely patent arch vessel origins. Right carotid system: Patent with mild calcified and soft plaque at the carotid bifurcation. No significant stenosis or evidence of dissection. Left carotid system: Patent with minimal calcified plaque at the carotid bifurcation. No stenosis or evidence of dissection. Vertebral arteries: Patent and codominant without evidence of stenosis or dissection. Skeleton: Remote, solid interbody osseous fusion at C4-5. Bilateral C4-5 facet ankylosis. Multilevel disc degeneration, advanced at C5-6 and C6-7. Bulky posterior spurring/disc osteophyte complex at C5-6 results in moderate spinal stenosis. Other neck: No mass or enlarged lymph nodes. Upper chest: Clear lung apices. Review of the MIP images confirms the above findings CTA HEAD FINDINGS Anterior circulation: The internal carotid arteries are patent from skull base to carotid termini with mild nonstenotic siphon atherosclerosis bilaterally. ACAs and MCAs are patent  without evidence of proximal branch occlusion or significant proximal stenosis. No aneurysm or vascular malformation. Posterior circulation: The intracranial vertebral arteries are widely patent to the basilar. PICAs and SCAs are unremarkable. AICAs are small and not well evaluated. The basilar artery is widely patent. There is a small right posterior communicating artery. PCAs are patent without evidence of significant stenosis. No aneurysm or vascular malformation. Venous sinuses: As permitted by contrast timing, patent. Anatomic variants: None. Delayed phase: Not performed. 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 IMPRESSION: 1. No large vessel occlusion. 2. Mild intracranial and cervical atherosclerosis without significant stenosis or aneurysm. 3. No evidence of acute ischemia on perfusion imaging. 4. Increased cortical blood flow throughout the posterior left cerebral hemisphere, query seizure activity in the setting of an old left parietal cortical infarct. These results were called by telephone at the time of interpretation on 10/05/2017 at 2:43 pm to Dr. Ritta Slot , who verbally acknowledged these results. Electronically Signed   By: Sebastian Ache M.D.   On: 10/05/2017 14:58   Ct Head Code Stroke Wo Contrast  Result Date: 10/05/2017 CLINICAL DATA:  Code stroke. Slurred speech. Prior stroke with residual right-sided deficits. EXAM: CT HEAD  WITHOUT CONTRAST TECHNIQUE: Contiguous axial images were obtained from the base of the skull through the vertex without intravenous contrast. COMPARISON:  01/31/2005 FINDINGS: Brain: There is no evidence of  acute infarct, intracranial hemorrhage, mass, midline shift, or extra-axial fluid collection. There is a chronic left frontoparietal cortical and subcortical infarct near the vertex which is new from the prior study. Mild lateral and third ventriculomegaly is new and felt to reflect central predominant cerebral atrophy rather than hydrocephalus. Periventricular white matter hypodensities are nonspecific but compatible with mild chronic small vessel ischemic disease. A chronic subcentimeter infarct in the left cerebellum is new. Vascular: Calcified atherosclerosis at the skull base. No hyperdense vessel. Skull: No fracture focal osseous lesion. Sinuses/Orbits: Tiny left maxillary sinus mucous retention cyst. Clear mastoid air cells. Unremarkable orbits. Other: None. ASPECTS [facility] Stroke Program Early CT Score) - Ganglionic level infarction (caudate, lentiform nuclei, internal capsule, insula, M1-M3 cortex): 7 - Supraganglionic infarction (M4-M6 cortex): 3 Total score (0-10 with 10 being normal): 10 IMPRESSION: 1. No evidence of acute intracranial abnormality. 2. ASPECTS is 10. 3. 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 4. Mild chronic small vessel ischemic disease and cerebral atrophy. These results were called by telephone at the time of interpretation on 10/05/2017 at 12:49 pm to Dr. Gwyneth Sprout , who verbally acknowledged these results. Electronically Signed   By: Sebastian Ache M.D.   On: 10/05/2017 12:50     Assessment & Plan:  Plan  I have discontinued Nyesha Husain's aspirin, Vitamin D3, levothyroxine, LYRICA, ferrous sulfate, and enoxaparin. I have also changed her amLODipine, levothyroxine, and pantoprazole. Additionally, I am having her maintain her acetaminophen, calcium carbonate, mirtazapine, polyethylene glycol, senna-docusate, lacosamide, phenytoin, magnesium oxide, warfarin, levETIRAcetam, gabapentin, and losartan.  Meds ordered this encounter  Medications  .  DISCONTD: gabapentin (NEURONTIN) 100 MG capsule    Sig: 2 po bid    Dispense:  360 capsule    Refill:  1  . amLODipine (NORVASC) 5 MG tablet    Sig: Take 1 tablet (5 mg total) by mouth daily.    Dispense:  90 tablet    Refill:  3  . gabapentin (NEURONTIN) 100 MG capsule    Sig: 2 po bid    Dispense:  360 capsule    Refill:  1  . levothyroxine (SYNTHROID, LEVOTHROID) 88 MCG tablet    Sig: Take 1 tablet (88 mcg total) by mouth daily before breakfast.    Dispense:  90 tablet    Refill:  3  . losartan (COZAAR) 100 MG tablet    Sig: Take 1 tablet (100 mg total) by mouth daily.    Dispense:  90 tablet    Refill:  3  . pantoprazole (PROTONIX) 40 MG tablet    Sig: Take 1 tablet (40 mg total) by mouth daily.    Dispense:  90 tablet    Refill:  3    Problem List Items Addressed This Visit      Unprioritized   CVA (cerebral vascular accident) [facility])    Per neurology On coumadin      Relevant Medications   amLODipine (NORVASC) 5 MG tablet   losartan (COZAAR) 100 MG tablet   Essential hypertension    Well controlled, no changes to meds. Encouraged heart healthy diet such as the DASH diet and exercise as tolerated.       Relevant Medications   amLODipine (NORVASC) 5 MG tablet   losartan (COZAAR) 100 MG tablet   GERD   Relevant  Medications   pantoprazole (PROTONIX) 40 MG tablet   Hyperlipidemia LDL goal <100    Lab Results  Component Value Date   CHOL 147 10/06/2017   HDL 58 10/06/2017   LDLCALC 76 10/06/2017   TRIG 64 10/06/2017   CHOLHDL 2.5 10/06/2017        Relevant Medications   amLODipine (NORVASC) 5 MG tablet   losartan (COZAAR) 100 MG tablet   Hypothyroidism    tsh to be checked today con't synthroid      Relevant Medications   levothyroxine (SYNTHROID, LEVOTHROID) 88 MCG tablet   Other Relevant Orders   TSH   Pulmonary embolism and infarction (HCC)    On coumadin  Per coumadin clinic      Relevant Medications   amLODipine (NORVASC) 5 MG tablet    losartan (COZAAR) 100 MG tablet   Seizure disorder (HCC)    Per neuro  Check dilantin today On keppra as well       Relevant Medications   gabapentin (NEURONTIN) 100 MG capsule   Seizures (HCC)   Relevant Medications   gabapentin (NEURONTIN) 100 MG capsule   Other Relevant Orders   Dilantin (Phenytoin) level, total    Other Visit Diagnoses    Neuropathic pain    -  Primary   Relevant Medications   gabapentin (NEURONTIN) 100 MG capsule      Follow-up: Return in about 6 months (around 07/14/2018).  Donato Schultz, DO

## 2018-01-12 NOTE — Assessment & Plan Note (Signed)
tsh to be checked today con't synthroid

## 2018-01-12 NOTE — Assessment & Plan Note (Signed)
Per neurology On coumadin

## 2018-01-12 NOTE — Assessment & Plan Note (Signed)
On coumadin Per coumadin clinic  

## 2018-01-12 NOTE — Assessment & Plan Note (Signed)
Lab Results  Component Value Date   CHOL 147 10/06/2017   HDL 58 10/06/2017   LDLCALC 76 10/06/2017   TRIG 64 10/06/2017   CHOLHDL 2.5 10/06/2017

## 2018-01-12 NOTE — Patient Outreach (Signed)
[facility] Care Management  01/12/2018  Ethelda ChickRovena Dentinger 1950-12-22 [id number]   Successful call placed to patients daughter to verify transportation needs to upcoming appointments. HIPAA identifiers confirmed via the patients daughter, Parke PoissonCharisse Franklin. [name] confirmed she recevied this BSW's voice message last week notifying of patients approval to begin using [facility] [employer] [facility] [employer] ([employer] [name] also confirmed the successful completion of patients dial-a-lift application by the patients primary physician on today's date.  BSW inquired if [name] was able to contact [employer] to arrange transportation for patients upcomming appointments. [name] has not called them yet. BSW requested [name] contact them at the end of this call in efforts to successfully obtain transportation to patients appointment on Thursday 6/20. BSW asked [name] to contact this BSW by 4:30 this afternoon if [facility] [employer] is unable to provide transportation to patients appointment on Thursday as this BSW needs to give [employer] a three day notice if arrangements for transportation are needed. [name] confirmed understanding and stated she would call. BSW also encouraged [name] to provide other upcomming transportation needs during the call in order to secure patients transportation.  During today's call BSW informed [name] there are other transportation options for the patient that are private pay. BSW to mail these resources to patients home in case an acute need arises and current transportation resources are unable to assist. BSW to also mail a list of adult day centers obtained through the [employer] service for the patient and [name] to begin reviewing. [name] would like to link the patient to a day program prior to  August. BSW encouraged [name] to call the resources provided to obtain more information and visit the locations to see if she feels it would be a good fit for the patient.  BSW to outreach in the next 2-3 weeks to verify resources are received. BSW will plan to close case at this time if no other social work needs are identified.  Bevelyn NgoKendra Adlai Sinning, Kenard GowerBSW, CDP Social Worker [phone]

## 2018-01-13 DIAGNOSIS — E1122 Type 2 diabetes mellitus with diabetic chronic kidney disease: Secondary | ICD-10-CM | POA: Diagnosis not present

## 2018-01-13 DIAGNOSIS — J45909 Unspecified asthma, uncomplicated: Secondary | ICD-10-CM | POA: Diagnosis not present

## 2018-01-13 DIAGNOSIS — I69351 Hemiplegia and hemiparesis following cerebral infarction affecting right dominant side: Secondary | ICD-10-CM | POA: Diagnosis not present

## 2018-01-13 DIAGNOSIS — I129 Hypertensive chronic kidney disease with stage 1 through stage 4 chronic kidney disease, or unspecified chronic kidney disease: Secondary | ICD-10-CM | POA: Diagnosis not present

## 2018-01-13 DIAGNOSIS — R569 Unspecified convulsions: Secondary | ICD-10-CM | POA: Diagnosis not present

## 2018-01-13 DIAGNOSIS — I69398 Other sequelae of cerebral infarction: Secondary | ICD-10-CM | POA: Diagnosis not present

## 2018-01-13 DIAGNOSIS — M47816 Spondylosis without myelopathy or radiculopathy, lumbar region: Secondary | ICD-10-CM | POA: Diagnosis not present

## 2018-01-13 DIAGNOSIS — N183 Chronic kidney disease, stage 3 (moderate): Secondary | ICD-10-CM | POA: Diagnosis not present

## 2018-01-13 DIAGNOSIS — I69320 Aphasia following cerebral infarction: Secondary | ICD-10-CM | POA: Diagnosis not present

## 2018-01-13 LAB — PHENYTOIN LEVEL, TOTAL: Phenytoin, Total: 12.1 mg/L (ref 10.0–20.0)

## 2018-01-14 ENCOUNTER — Encounter: Payer: Self-pay | Admitting: *Deleted

## 2018-01-15 ENCOUNTER — Telehealth: Payer: Self-pay | Admitting: *Deleted

## 2018-01-15 DIAGNOSIS — G8191 Hemiplegia, unspecified affecting right dominant side: Secondary | ICD-10-CM | POA: Diagnosis not present

## 2018-01-15 DIAGNOSIS — I69398 Other sequelae of cerebral infarction: Secondary | ICD-10-CM | POA: Diagnosis not present

## 2018-01-15 DIAGNOSIS — I10 Essential (primary) hypertension: Secondary | ICD-10-CM | POA: Diagnosis not present

## 2018-01-15 DIAGNOSIS — J45909 Unspecified asthma, uncomplicated: Secondary | ICD-10-CM | POA: Diagnosis not present

## 2018-01-15 DIAGNOSIS — I618 Other nontraumatic intracerebral hemorrhage: Secondary | ICD-10-CM | POA: Diagnosis not present

## 2018-01-15 DIAGNOSIS — I69351 Hemiplegia and hemiparesis following cerebral infarction affecting right dominant side: Secondary | ICD-10-CM | POA: Diagnosis not present

## 2018-01-15 DIAGNOSIS — E1122 Type 2 diabetes mellitus with diabetic chronic kidney disease: Secondary | ICD-10-CM | POA: Diagnosis not present

## 2018-01-15 DIAGNOSIS — I2699 Other pulmonary embolism without acute cor pulmonale: Secondary | ICD-10-CM | POA: Diagnosis not present

## 2018-01-15 DIAGNOSIS — M47816 Spondylosis without myelopathy or radiculopathy, lumbar region: Secondary | ICD-10-CM | POA: Diagnosis not present

## 2018-01-15 DIAGNOSIS — N183 Chronic kidney disease, stage 3 (moderate): Secondary | ICD-10-CM | POA: Diagnosis not present

## 2018-01-15 DIAGNOSIS — I129 Hypertensive chronic kidney disease with stage 1 through stage 4 chronic kidney disease, or unspecified chronic kidney disease: Secondary | ICD-10-CM | POA: Diagnosis not present

## 2018-01-15 DIAGNOSIS — I69320 Aphasia following cerebral infarction: Secondary | ICD-10-CM | POA: Diagnosis not present

## 2018-01-15 DIAGNOSIS — I629 Nontraumatic intracranial hemorrhage, unspecified: Secondary | ICD-10-CM | POA: Diagnosis not present

## 2018-01-15 DIAGNOSIS — R569 Unspecified convulsions: Secondary | ICD-10-CM | POA: Diagnosis not present

## 2018-01-15 DIAGNOSIS — Z7901 Long term (current) use of anticoagulants: Secondary | ICD-10-CM | POA: Diagnosis not present

## 2018-01-15 NOTE — Telephone Encounter (Signed)
Received Physician Orders from [facility]; forwarded to provider/SLS 06/20

## 2018-01-16 DIAGNOSIS — J45909 Unspecified asthma, uncomplicated: Secondary | ICD-10-CM | POA: Diagnosis not present

## 2018-01-16 DIAGNOSIS — N183 Chronic kidney disease, stage 3 (moderate): Secondary | ICD-10-CM | POA: Diagnosis not present

## 2018-01-16 DIAGNOSIS — E1122 Type 2 diabetes mellitus with diabetic chronic kidney disease: Secondary | ICD-10-CM | POA: Diagnosis not present

## 2018-01-16 DIAGNOSIS — I69351 Hemiplegia and hemiparesis following cerebral infarction affecting right dominant side: Secondary | ICD-10-CM | POA: Diagnosis not present

## 2018-01-16 DIAGNOSIS — M47816 Spondylosis without myelopathy or radiculopathy, lumbar region: Secondary | ICD-10-CM | POA: Diagnosis not present

## 2018-01-16 DIAGNOSIS — I69398 Other sequelae of cerebral infarction: Secondary | ICD-10-CM | POA: Diagnosis not present

## 2018-01-16 DIAGNOSIS — R569 Unspecified convulsions: Secondary | ICD-10-CM | POA: Diagnosis not present

## 2018-01-16 DIAGNOSIS — I69320 Aphasia following cerebral infarction: Secondary | ICD-10-CM | POA: Diagnosis not present

## 2018-01-16 DIAGNOSIS — I129 Hypertensive chronic kidney disease with stage 1 through stage 4 chronic kidney disease, or unspecified chronic kidney disease: Secondary | ICD-10-CM | POA: Diagnosis not present

## 2018-01-19 ENCOUNTER — Encounter: Payer: Self-pay | Admitting: *Deleted

## 2018-01-19 ENCOUNTER — Telehealth: Payer: Self-pay | Admitting: *Deleted

## 2018-01-19 DIAGNOSIS — I69351 Hemiplegia and hemiparesis following cerebral infarction affecting right dominant side: Secondary | ICD-10-CM | POA: Diagnosis not present

## 2018-01-19 DIAGNOSIS — E1122 Type 2 diabetes mellitus with diabetic chronic kidney disease: Secondary | ICD-10-CM | POA: Diagnosis not present

## 2018-01-19 DIAGNOSIS — I129 Hypertensive chronic kidney disease with stage 1 through stage 4 chronic kidney disease, or unspecified chronic kidney disease: Secondary | ICD-10-CM | POA: Diagnosis not present

## 2018-01-19 DIAGNOSIS — I69398 Other sequelae of cerebral infarction: Secondary | ICD-10-CM | POA: Diagnosis not present

## 2018-01-19 DIAGNOSIS — R569 Unspecified convulsions: Secondary | ICD-10-CM | POA: Diagnosis not present

## 2018-01-19 DIAGNOSIS — M47816 Spondylosis without myelopathy or radiculopathy, lumbar region: Secondary | ICD-10-CM | POA: Diagnosis not present

## 2018-01-19 DIAGNOSIS — J45909 Unspecified asthma, uncomplicated: Secondary | ICD-10-CM | POA: Diagnosis not present

## 2018-01-19 DIAGNOSIS — N183 Chronic kidney disease, stage 3 (moderate): Secondary | ICD-10-CM | POA: Diagnosis not present

## 2018-01-19 DIAGNOSIS — I69320 Aphasia following cerebral infarction: Secondary | ICD-10-CM | POA: Diagnosis not present

## 2018-01-19 NOTE — Telephone Encounter (Signed)
Labs faxed

## 2018-01-19 NOTE — Telephone Encounter (Signed)
Copied from CRM [id number]. Topic: Inquiry >> Jan 16, 2018  3:48 PM Alexander BergeronBarksdale, Harvey B wrote: Reason for CRM: [facility]'s Result Notes for Dilantin (Phenytoin) level faxed over to them, call @: [phone] Fax: [phone]

## 2018-01-20 ENCOUNTER — Other Ambulatory Visit: Payer: Self-pay

## 2018-01-20 DIAGNOSIS — J45909 Unspecified asthma, uncomplicated: Secondary | ICD-10-CM | POA: Diagnosis not present

## 2018-01-20 DIAGNOSIS — I69320 Aphasia following cerebral infarction: Secondary | ICD-10-CM | POA: Diagnosis not present

## 2018-01-20 DIAGNOSIS — I129 Hypertensive chronic kidney disease with stage 1 through stage 4 chronic kidney disease, or unspecified chronic kidney disease: Secondary | ICD-10-CM | POA: Diagnosis not present

## 2018-01-20 DIAGNOSIS — M47816 Spondylosis without myelopathy or radiculopathy, lumbar region: Secondary | ICD-10-CM | POA: Diagnosis not present

## 2018-01-20 DIAGNOSIS — I69398 Other sequelae of cerebral infarction: Secondary | ICD-10-CM | POA: Diagnosis not present

## 2018-01-20 DIAGNOSIS — I69351 Hemiplegia and hemiparesis following cerebral infarction affecting right dominant side: Secondary | ICD-10-CM | POA: Diagnosis not present

## 2018-01-20 DIAGNOSIS — E1122 Type 2 diabetes mellitus with diabetic chronic kidney disease: Secondary | ICD-10-CM | POA: Diagnosis not present

## 2018-01-20 DIAGNOSIS — N183 Chronic kidney disease, stage 3 (moderate): Secondary | ICD-10-CM | POA: Diagnosis not present

## 2018-01-20 DIAGNOSIS — R569 Unspecified convulsions: Secondary | ICD-10-CM | POA: Diagnosis not present

## 2018-01-20 NOTE — Patient Outreach (Signed)
[facility]) Care Management  01/20/2018  Kathleen ChickRovena Jensen 1950/12/03 [id number]   BSW recevied a call from patients daughter inquiring about transportation options for patients upcomming appointment. Kathleen Jensen able to verify that the patients primary physician has successfully completed the "[employer]" application for [place]. However, Kathleen RingsCharisse has not yet submitted this application. Kathleen Jensen states she plans to submit this application this week.  Kathleen Jensen inquired if [place] [facility] [place] [facility]) would be able to transport the patient to her appointment on July 8th if the patients application has not been processed by [employer]. BSW notified patients daughter that [facility] does not usually service [place] unless it is to transport patients out of city limits. BSW encouraged Kathleen Jensen to contact [facility] to inquire if patient could utilize their service given she is an approved rider and has not yet been approved for Dial-[employer]-Lift. Kathleen Jensen stated she will contact [facility] at the end of this call.  BSW to contact patient and her daughter next week to inquire if transportation has been arranged to the patients upcomming July 8th appointment.  Kathleen NgoKendra Prestina Jensen, BSW, CDP [facility] Care Management Social Worker [phone]

## 2018-01-21 ENCOUNTER — Telehealth: Payer: Self-pay | Admitting: Family Medicine

## 2018-01-21 DIAGNOSIS — M47816 Spondylosis without myelopathy or radiculopathy, lumbar region: Secondary | ICD-10-CM | POA: Diagnosis not present

## 2018-01-21 DIAGNOSIS — Z79899 Other long term (current) drug therapy: Secondary | ICD-10-CM | POA: Diagnosis not present

## 2018-01-21 DIAGNOSIS — I69320 Aphasia following cerebral infarction: Secondary | ICD-10-CM | POA: Diagnosis not present

## 2018-01-21 DIAGNOSIS — I680 Cerebral amyloid angiopathy: Secondary | ICD-10-CM | POA: Diagnosis not present

## 2018-01-21 DIAGNOSIS — I69398 Other sequelae of cerebral infarction: Secondary | ICD-10-CM | POA: Diagnosis not present

## 2018-01-21 DIAGNOSIS — Z885 Allergy status to narcotic agent status: Secondary | ICD-10-CM | POA: Diagnosis not present

## 2018-01-21 DIAGNOSIS — Z9884 Bariatric surgery status: Secondary | ICD-10-CM | POA: Diagnosis not present

## 2018-01-21 DIAGNOSIS — N183 Chronic kidney disease, stage 3 (moderate): Secondary | ICD-10-CM | POA: Diagnosis not present

## 2018-01-21 DIAGNOSIS — I129 Hypertensive chronic kidney disease with stage 1 through stage 4 chronic kidney disease, or unspecified chronic kidney disease: Secondary | ICD-10-CM | POA: Diagnosis not present

## 2018-01-21 DIAGNOSIS — J45909 Unspecified asthma, uncomplicated: Secondary | ICD-10-CM | POA: Diagnosis not present

## 2018-01-21 DIAGNOSIS — Z882 Allergy status to sulfonamides status: Secondary | ICD-10-CM | POA: Diagnosis not present

## 2018-01-21 DIAGNOSIS — I69351 Hemiplegia and hemiparesis following cerebral infarction affecting right dominant side: Secondary | ICD-10-CM | POA: Diagnosis not present

## 2018-01-21 DIAGNOSIS — E1122 Type 2 diabetes mellitus with diabetic chronic kidney disease: Secondary | ICD-10-CM | POA: Diagnosis not present

## 2018-01-21 DIAGNOSIS — R569 Unspecified convulsions: Secondary | ICD-10-CM | POA: Diagnosis not present

## 2018-01-21 NOTE — Telephone Encounter (Unsigned)
Copied from CRM [id number]. Topic: General - Other >> Jan 21, 2018  1:36 PM Marylen PontoMcneil, Ja-Kwan wrote: Reason for CRM: Beth with [facility] requests verbal orders for speech therapy for 2 times a week for 6 weeks. Cb# [phone]

## 2018-01-22 DIAGNOSIS — R569 Unspecified convulsions: Secondary | ICD-10-CM | POA: Diagnosis not present

## 2018-01-22 DIAGNOSIS — I69398 Other sequelae of cerebral infarction: Secondary | ICD-10-CM | POA: Diagnosis not present

## 2018-01-22 DIAGNOSIS — J45909 Unspecified asthma, uncomplicated: Secondary | ICD-10-CM | POA: Diagnosis not present

## 2018-01-22 DIAGNOSIS — N183 Chronic kidney disease, stage 3 (moderate): Secondary | ICD-10-CM | POA: Diagnosis not present

## 2018-01-22 DIAGNOSIS — E1122 Type 2 diabetes mellitus with diabetic chronic kidney disease: Secondary | ICD-10-CM | POA: Diagnosis not present

## 2018-01-22 DIAGNOSIS — I69351 Hemiplegia and hemiparesis following cerebral infarction affecting right dominant side: Secondary | ICD-10-CM | POA: Diagnosis not present

## 2018-01-22 DIAGNOSIS — M47816 Spondylosis without myelopathy or radiculopathy, lumbar region: Secondary | ICD-10-CM | POA: Diagnosis not present

## 2018-01-22 DIAGNOSIS — I129 Hypertensive chronic kidney disease with stage 1 through stage 4 chronic kidney disease, or unspecified chronic kidney disease: Secondary | ICD-10-CM | POA: Diagnosis not present

## 2018-01-22 DIAGNOSIS — I69320 Aphasia following cerebral infarction: Secondary | ICD-10-CM | POA: Diagnosis not present

## 2018-01-22 NOTE — Telephone Encounter (Signed)
Left detailed message with speech therapist to give verbal orders.

## 2018-01-23 DIAGNOSIS — I69351 Hemiplegia and hemiparesis following cerebral infarction affecting right dominant side: Secondary | ICD-10-CM | POA: Diagnosis not present

## 2018-01-23 DIAGNOSIS — I69398 Other sequelae of cerebral infarction: Secondary | ICD-10-CM | POA: Diagnosis not present

## 2018-01-23 DIAGNOSIS — I129 Hypertensive chronic kidney disease with stage 1 through stage 4 chronic kidney disease, or unspecified chronic kidney disease: Secondary | ICD-10-CM | POA: Diagnosis not present

## 2018-01-23 DIAGNOSIS — J45909 Unspecified asthma, uncomplicated: Secondary | ICD-10-CM | POA: Diagnosis not present

## 2018-01-23 DIAGNOSIS — N183 Chronic kidney disease, stage 3 (moderate): Secondary | ICD-10-CM | POA: Diagnosis not present

## 2018-01-23 DIAGNOSIS — E1122 Type 2 diabetes mellitus with diabetic chronic kidney disease: Secondary | ICD-10-CM | POA: Diagnosis not present

## 2018-01-23 DIAGNOSIS — R569 Unspecified convulsions: Secondary | ICD-10-CM | POA: Diagnosis not present

## 2018-01-23 DIAGNOSIS — M47816 Spondylosis without myelopathy or radiculopathy, lumbar region: Secondary | ICD-10-CM | POA: Diagnosis not present

## 2018-01-23 DIAGNOSIS — I69320 Aphasia following cerebral infarction: Secondary | ICD-10-CM | POA: Diagnosis not present

## 2018-01-26 ENCOUNTER — Other Ambulatory Visit: Payer: Self-pay

## 2018-01-26 ENCOUNTER — Telehealth: Payer: Self-pay | Admitting: Family Medicine

## 2018-01-26 DIAGNOSIS — J45909 Unspecified asthma, uncomplicated: Secondary | ICD-10-CM | POA: Diagnosis not present

## 2018-01-26 DIAGNOSIS — N183 Chronic kidney disease, stage 3 (moderate): Secondary | ICD-10-CM | POA: Diagnosis not present

## 2018-01-26 DIAGNOSIS — I129 Hypertensive chronic kidney disease with stage 1 through stage 4 chronic kidney disease, or unspecified chronic kidney disease: Secondary | ICD-10-CM | POA: Diagnosis not present

## 2018-01-26 DIAGNOSIS — M47816 Spondylosis without myelopathy or radiculopathy, lumbar region: Secondary | ICD-10-CM | POA: Diagnosis not present

## 2018-01-26 DIAGNOSIS — I69351 Hemiplegia and hemiparesis following cerebral infarction affecting right dominant side: Secondary | ICD-10-CM | POA: Diagnosis not present

## 2018-01-26 DIAGNOSIS — I69398 Other sequelae of cerebral infarction: Secondary | ICD-10-CM | POA: Diagnosis not present

## 2018-01-26 DIAGNOSIS — I69320 Aphasia following cerebral infarction: Secondary | ICD-10-CM | POA: Diagnosis not present

## 2018-01-26 DIAGNOSIS — E1122 Type 2 diabetes mellitus with diabetic chronic kidney disease: Secondary | ICD-10-CM | POA: Diagnosis not present

## 2018-01-26 DIAGNOSIS — G40909 Epilepsy, unspecified, not intractable, without status epilepticus: Secondary | ICD-10-CM | POA: Diagnosis not present

## 2018-01-26 NOTE — Patient Outreach (Signed)
[facility]) Care Management  01/26/2018  Ethelda ChickRovena Sparlin 1950-12-13 [id number]  BSW outreached the patient and her daughter to confirm transportation was arranged for the patients July 8th physician appointment. BSW spoke with patients daughter, Parke PoissonCharisse Franklin who was able to verify HIPAA. [name] confirmed the patient would be transported by [place] [employer] and [employer] ([facility]). [name] did inform this BSW that she has yet to submit the patients application for the [place] bus (Dial-[employer]-Lift) due to difficulties in finding someone to stay with the patient during business hours. BSW encouraged [name] to contact [employer] to inquire if she could submit the application via mail. [name] confirmed she would try.   BSW explained that this BSW would contact next month but requested an incoming call if the patient has transportation needs prior to our next call. BSW will plan a case closure on next call considering the patient has been linked to transportation services which can accommodate her current ambulation status.  Bevelyn NgoKendra Ruthann Angulo, BSW, CDP [facility] Care Management Social Worker [phone]

## 2018-01-26 NOTE — Telephone Encounter (Signed)
Mobile Crisis comes to home.

## 2018-01-26 NOTE — Telephone Encounter (Unsigned)
Copied from CRM [id number]. Topic: Quick Communication - See Telephone Encounter >> Jan 26, 2018  4:32 PM Raquel SarnaHayes, Teresa G wrote: Roger Killharrisse Franklin - daughter asking if Dr. Laury AxonLowne could refer her mother - pt to a psychologist that could come to the home to counsel with pt care after her stroke.

## 2018-01-27 DIAGNOSIS — I129 Hypertensive chronic kidney disease with stage 1 through stage 4 chronic kidney disease, or unspecified chronic kidney disease: Secondary | ICD-10-CM | POA: Diagnosis not present

## 2018-01-27 DIAGNOSIS — I69351 Hemiplegia and hemiparesis following cerebral infarction affecting right dominant side: Secondary | ICD-10-CM | POA: Diagnosis not present

## 2018-01-27 DIAGNOSIS — J45909 Unspecified asthma, uncomplicated: Secondary | ICD-10-CM | POA: Diagnosis not present

## 2018-01-27 DIAGNOSIS — I69398 Other sequelae of cerebral infarction: Secondary | ICD-10-CM | POA: Diagnosis not present

## 2018-01-27 DIAGNOSIS — E1122 Type 2 diabetes mellitus with diabetic chronic kidney disease: Secondary | ICD-10-CM | POA: Diagnosis not present

## 2018-01-27 DIAGNOSIS — M47816 Spondylosis without myelopathy or radiculopathy, lumbar region: Secondary | ICD-10-CM | POA: Diagnosis not present

## 2018-01-27 DIAGNOSIS — G40909 Epilepsy, unspecified, not intractable, without status epilepticus: Secondary | ICD-10-CM | POA: Diagnosis not present

## 2018-01-27 DIAGNOSIS — I69320 Aphasia following cerebral infarction: Secondary | ICD-10-CM | POA: Diagnosis not present

## 2018-01-27 DIAGNOSIS — N183 Chronic kidney disease, stage 3 (moderate): Secondary | ICD-10-CM | POA: Diagnosis not present

## 2018-01-27 NOTE — Telephone Encounter (Signed)
Left message on machine that she can call mobile crisis at [phone]

## 2018-01-28 ENCOUNTER — Telehealth: Payer: Self-pay | Admitting: *Deleted

## 2018-01-28 DIAGNOSIS — N183 Chronic kidney disease, stage 3 (moderate): Secondary | ICD-10-CM | POA: Diagnosis not present

## 2018-01-28 DIAGNOSIS — J45909 Unspecified asthma, uncomplicated: Secondary | ICD-10-CM | POA: Diagnosis not present

## 2018-01-28 DIAGNOSIS — I69398 Other sequelae of cerebral infarction: Secondary | ICD-10-CM | POA: Diagnosis not present

## 2018-01-28 DIAGNOSIS — I129 Hypertensive chronic kidney disease with stage 1 through stage 4 chronic kidney disease, or unspecified chronic kidney disease: Secondary | ICD-10-CM | POA: Diagnosis not present

## 2018-01-28 DIAGNOSIS — I69320 Aphasia following cerebral infarction: Secondary | ICD-10-CM | POA: Diagnosis not present

## 2018-01-28 DIAGNOSIS — I69351 Hemiplegia and hemiparesis following cerebral infarction affecting right dominant side: Secondary | ICD-10-CM | POA: Diagnosis not present

## 2018-01-28 DIAGNOSIS — G40909 Epilepsy, unspecified, not intractable, without status epilepticus: Secondary | ICD-10-CM | POA: Diagnosis not present

## 2018-01-28 DIAGNOSIS — M47816 Spondylosis without myelopathy or radiculopathy, lumbar region: Secondary | ICD-10-CM | POA: Diagnosis not present

## 2018-01-28 DIAGNOSIS — E1122 Type 2 diabetes mellitus with diabetic chronic kidney disease: Secondary | ICD-10-CM | POA: Diagnosis not present

## 2018-01-28 NOTE — Telephone Encounter (Signed)
Received results from [facility]; forwarded to provider/SLS 07/03

## 2018-01-29 DIAGNOSIS — I69351 Hemiplegia and hemiparesis following cerebral infarction affecting right dominant side: Secondary | ICD-10-CM | POA: Diagnosis not present

## 2018-01-29 DIAGNOSIS — M47816 Spondylosis without myelopathy or radiculopathy, lumbar region: Secondary | ICD-10-CM | POA: Diagnosis not present

## 2018-01-29 DIAGNOSIS — I69320 Aphasia following cerebral infarction: Secondary | ICD-10-CM | POA: Diagnosis not present

## 2018-01-29 DIAGNOSIS — I129 Hypertensive chronic kidney disease with stage 1 through stage 4 chronic kidney disease, or unspecified chronic kidney disease: Secondary | ICD-10-CM | POA: Diagnosis not present

## 2018-01-29 DIAGNOSIS — I69398 Other sequelae of cerebral infarction: Secondary | ICD-10-CM | POA: Diagnosis not present

## 2018-01-29 DIAGNOSIS — N183 Chronic kidney disease, stage 3 (moderate): Secondary | ICD-10-CM | POA: Diagnosis not present

## 2018-01-29 DIAGNOSIS — J45909 Unspecified asthma, uncomplicated: Secondary | ICD-10-CM | POA: Diagnosis not present

## 2018-01-29 DIAGNOSIS — E1122 Type 2 diabetes mellitus with diabetic chronic kidney disease: Secondary | ICD-10-CM | POA: Diagnosis not present

## 2018-01-29 DIAGNOSIS — G40909 Epilepsy, unspecified, not intractable, without status epilepticus: Secondary | ICD-10-CM | POA: Diagnosis not present

## 2018-01-30 ENCOUNTER — Telehealth: Payer: Self-pay | Admitting: *Deleted

## 2018-01-30 ENCOUNTER — Telehealth: Payer: Self-pay | Admitting: Family Medicine

## 2018-01-30 ENCOUNTER — Ambulatory Visit: Payer: Medicare HMO

## 2018-01-30 NOTE — Telephone Encounter (Signed)
Received Physician Orders for PT from [facility]; forwarded to provider/SLS 07/05

## 2018-01-30 NOTE — Telephone Encounter (Signed)
Copied from CRM [id number]. Topic: Quick Communication - See Telephone Encounter >> Jan 30, 2018  5:02 PM Raquel SarnaHayes, Teresa G wrote: Dr. Tera HelperBoggs - neurologist has changed pt's gabapentin (NEURONTIN) 100 MG capsule to 300 mg capsule 3 times a day for pt.   Pt's Daughter called to let Dr. Laury AxonLowne know of the change in Rx prescribed. Please call [name] if there are any questions.

## 2018-02-02 ENCOUNTER — Telehealth: Payer: Self-pay | Admitting: *Deleted

## 2018-02-02 DIAGNOSIS — I680 Cerebral amyloid angiopathy: Secondary | ICD-10-CM | POA: Diagnosis not present

## 2018-02-02 DIAGNOSIS — R569 Unspecified convulsions: Secondary | ICD-10-CM | POA: Diagnosis not present

## 2018-02-02 DIAGNOSIS — Z882 Allergy status to sulfonamides status: Secondary | ICD-10-CM | POA: Diagnosis not present

## 2018-02-02 DIAGNOSIS — I2699 Other pulmonary embolism without acute cor pulmonale: Secondary | ICD-10-CM | POA: Diagnosis not present

## 2018-02-02 DIAGNOSIS — I10 Essential (primary) hypertension: Secondary | ICD-10-CM | POA: Diagnosis not present

## 2018-02-02 DIAGNOSIS — Z885 Allergy status to narcotic agent status: Secondary | ICD-10-CM | POA: Diagnosis not present

## 2018-02-02 DIAGNOSIS — E854 Organ-limited amyloidosis: Secondary | ICD-10-CM | POA: Diagnosis not present

## 2018-02-02 DIAGNOSIS — I69398 Other sequelae of cerebral infarction: Secondary | ICD-10-CM | POA: Diagnosis not present

## 2018-02-02 DIAGNOSIS — Z7901 Long term (current) use of anticoagulants: Secondary | ICD-10-CM | POA: Diagnosis not present

## 2018-02-02 DIAGNOSIS — Z8679 Personal history of other diseases of the circulatory system: Secondary | ICD-10-CM | POA: Diagnosis not present

## 2018-02-02 DIAGNOSIS — I629 Nontraumatic intracranial hemorrhage, unspecified: Secondary | ICD-10-CM | POA: Diagnosis not present

## 2018-02-02 DIAGNOSIS — M4800 Spinal stenosis, site unspecified: Secondary | ICD-10-CM | POA: Diagnosis not present

## 2018-02-02 NOTE — Telephone Encounter (Signed)
Received Physician Orders from [facility]; forwarded to provider/SLS 07/08

## 2018-02-03 ENCOUNTER — Encounter: Payer: Self-pay | Admitting: *Deleted

## 2018-02-03 DIAGNOSIS — G40909 Epilepsy, unspecified, not intractable, without status epilepticus: Secondary | ICD-10-CM | POA: Diagnosis not present

## 2018-02-03 DIAGNOSIS — I69351 Hemiplegia and hemiparesis following cerebral infarction affecting right dominant side: Secondary | ICD-10-CM | POA: Diagnosis not present

## 2018-02-03 DIAGNOSIS — M47816 Spondylosis without myelopathy or radiculopathy, lumbar region: Secondary | ICD-10-CM | POA: Diagnosis not present

## 2018-02-03 DIAGNOSIS — J45909 Unspecified asthma, uncomplicated: Secondary | ICD-10-CM | POA: Diagnosis not present

## 2018-02-03 DIAGNOSIS — I69320 Aphasia following cerebral infarction: Secondary | ICD-10-CM | POA: Diagnosis not present

## 2018-02-03 DIAGNOSIS — I69398 Other sequelae of cerebral infarction: Secondary | ICD-10-CM | POA: Diagnosis not present

## 2018-02-03 DIAGNOSIS — E1122 Type 2 diabetes mellitus with diabetic chronic kidney disease: Secondary | ICD-10-CM | POA: Diagnosis not present

## 2018-02-03 DIAGNOSIS — N183 Chronic kidney disease, stage 3 (moderate): Secondary | ICD-10-CM | POA: Diagnosis not present

## 2018-02-03 DIAGNOSIS — I129 Hypertensive chronic kidney disease with stage 1 through stage 4 chronic kidney disease, or unspecified chronic kidney disease: Secondary | ICD-10-CM | POA: Diagnosis not present

## 2018-02-03 NOTE — Telephone Encounter (Signed)
FYI  Med changed in med list

## 2018-02-03 NOTE — Telephone Encounter (Signed)
Thank you :)

## 2018-02-04 DIAGNOSIS — N183 Chronic kidney disease, stage 3 (moderate): Secondary | ICD-10-CM | POA: Diagnosis not present

## 2018-02-04 DIAGNOSIS — I129 Hypertensive chronic kidney disease with stage 1 through stage 4 chronic kidney disease, or unspecified chronic kidney disease: Secondary | ICD-10-CM | POA: Diagnosis not present

## 2018-02-04 DIAGNOSIS — J45909 Unspecified asthma, uncomplicated: Secondary | ICD-10-CM | POA: Diagnosis not present

## 2018-02-04 DIAGNOSIS — E1122 Type 2 diabetes mellitus with diabetic chronic kidney disease: Secondary | ICD-10-CM | POA: Diagnosis not present

## 2018-02-04 DIAGNOSIS — M47816 Spondylosis without myelopathy or radiculopathy, lumbar region: Secondary | ICD-10-CM | POA: Diagnosis not present

## 2018-02-04 DIAGNOSIS — I69351 Hemiplegia and hemiparesis following cerebral infarction affecting right dominant side: Secondary | ICD-10-CM | POA: Diagnosis not present

## 2018-02-04 DIAGNOSIS — G40909 Epilepsy, unspecified, not intractable, without status epilepticus: Secondary | ICD-10-CM | POA: Diagnosis not present

## 2018-02-04 DIAGNOSIS — I69398 Other sequelae of cerebral infarction: Secondary | ICD-10-CM | POA: Diagnosis not present

## 2018-02-04 DIAGNOSIS — I69320 Aphasia following cerebral infarction: Secondary | ICD-10-CM | POA: Diagnosis not present

## 2018-02-05 DIAGNOSIS — I69320 Aphasia following cerebral infarction: Secondary | ICD-10-CM | POA: Diagnosis not present

## 2018-02-05 DIAGNOSIS — N183 Chronic kidney disease, stage 3 (moderate): Secondary | ICD-10-CM | POA: Diagnosis not present

## 2018-02-05 DIAGNOSIS — I69398 Other sequelae of cerebral infarction: Secondary | ICD-10-CM | POA: Diagnosis not present

## 2018-02-05 DIAGNOSIS — I69351 Hemiplegia and hemiparesis following cerebral infarction affecting right dominant side: Secondary | ICD-10-CM | POA: Diagnosis not present

## 2018-02-05 DIAGNOSIS — J45909 Unspecified asthma, uncomplicated: Secondary | ICD-10-CM | POA: Diagnosis not present

## 2018-02-05 DIAGNOSIS — I129 Hypertensive chronic kidney disease with stage 1 through stage 4 chronic kidney disease, or unspecified chronic kidney disease: Secondary | ICD-10-CM | POA: Diagnosis not present

## 2018-02-05 DIAGNOSIS — G40909 Epilepsy, unspecified, not intractable, without status epilepticus: Secondary | ICD-10-CM | POA: Diagnosis not present

## 2018-02-05 DIAGNOSIS — E1122 Type 2 diabetes mellitus with diabetic chronic kidney disease: Secondary | ICD-10-CM | POA: Diagnosis not present

## 2018-02-05 DIAGNOSIS — M47816 Spondylosis without myelopathy or radiculopathy, lumbar region: Secondary | ICD-10-CM | POA: Diagnosis not present

## 2018-02-06 DIAGNOSIS — M47816 Spondylosis without myelopathy or radiculopathy, lumbar region: Secondary | ICD-10-CM | POA: Diagnosis not present

## 2018-02-06 DIAGNOSIS — I129 Hypertensive chronic kidney disease with stage 1 through stage 4 chronic kidney disease, or unspecified chronic kidney disease: Secondary | ICD-10-CM | POA: Diagnosis not present

## 2018-02-06 DIAGNOSIS — E1122 Type 2 diabetes mellitus with diabetic chronic kidney disease: Secondary | ICD-10-CM | POA: Diagnosis not present

## 2018-02-06 DIAGNOSIS — J45909 Unspecified asthma, uncomplicated: Secondary | ICD-10-CM | POA: Diagnosis not present

## 2018-02-06 DIAGNOSIS — N183 Chronic kidney disease, stage 3 (moderate): Secondary | ICD-10-CM | POA: Diagnosis not present

## 2018-02-06 DIAGNOSIS — I69398 Other sequelae of cerebral infarction: Secondary | ICD-10-CM | POA: Diagnosis not present

## 2018-02-06 DIAGNOSIS — I69351 Hemiplegia and hemiparesis following cerebral infarction affecting right dominant side: Secondary | ICD-10-CM | POA: Diagnosis not present

## 2018-02-06 DIAGNOSIS — G40909 Epilepsy, unspecified, not intractable, without status epilepticus: Secondary | ICD-10-CM | POA: Diagnosis not present

## 2018-02-06 DIAGNOSIS — I69320 Aphasia following cerebral infarction: Secondary | ICD-10-CM | POA: Diagnosis not present

## 2018-02-09 ENCOUNTER — Telehealth: Payer: Self-pay | Admitting: Family Medicine

## 2018-02-09 DIAGNOSIS — J45909 Unspecified asthma, uncomplicated: Secondary | ICD-10-CM | POA: Diagnosis not present

## 2018-02-09 DIAGNOSIS — M47816 Spondylosis without myelopathy or radiculopathy, lumbar region: Secondary | ICD-10-CM | POA: Diagnosis not present

## 2018-02-09 DIAGNOSIS — I69351 Hemiplegia and hemiparesis following cerebral infarction affecting right dominant side: Secondary | ICD-10-CM | POA: Diagnosis not present

## 2018-02-09 DIAGNOSIS — I69398 Other sequelae of cerebral infarction: Secondary | ICD-10-CM | POA: Diagnosis not present

## 2018-02-09 DIAGNOSIS — E1122 Type 2 diabetes mellitus with diabetic chronic kidney disease: Secondary | ICD-10-CM | POA: Diagnosis not present

## 2018-02-09 DIAGNOSIS — I129 Hypertensive chronic kidney disease with stage 1 through stage 4 chronic kidney disease, or unspecified chronic kidney disease: Secondary | ICD-10-CM | POA: Diagnosis not present

## 2018-02-09 DIAGNOSIS — I69320 Aphasia following cerebral infarction: Secondary | ICD-10-CM | POA: Diagnosis not present

## 2018-02-09 DIAGNOSIS — G40909 Epilepsy, unspecified, not intractable, without status epilepticus: Secondary | ICD-10-CM | POA: Diagnosis not present

## 2018-02-09 DIAGNOSIS — N183 Chronic kidney disease, stage 3 (moderate): Secondary | ICD-10-CM | POA: Diagnosis not present

## 2018-02-09 MED FILL — levETIRAcetam 1000 MG TABS: 1000 | 15 days supply | Qty: 30 | Fill #0

## 2018-02-09 NOTE — Telephone Encounter (Signed)
Patient dropped off form to be completed by Lowne. Insurance form from cruise she was not able to go on due to stroke. Placed in tray up front. Please call patient when ready for pick up

## 2018-02-10 ENCOUNTER — Ambulatory Visit: Payer: Self-pay | Admitting: *Deleted

## 2018-02-10 DIAGNOSIS — I69398 Other sequelae of cerebral infarction: Secondary | ICD-10-CM | POA: Diagnosis not present

## 2018-02-10 DIAGNOSIS — M47816 Spondylosis without myelopathy or radiculopathy, lumbar region: Secondary | ICD-10-CM | POA: Diagnosis not present

## 2018-02-10 DIAGNOSIS — N183 Chronic kidney disease, stage 3 (moderate): Secondary | ICD-10-CM | POA: Diagnosis not present

## 2018-02-10 DIAGNOSIS — I69351 Hemiplegia and hemiparesis following cerebral infarction affecting right dominant side: Secondary | ICD-10-CM | POA: Diagnosis not present

## 2018-02-10 DIAGNOSIS — E1122 Type 2 diabetes mellitus with diabetic chronic kidney disease: Secondary | ICD-10-CM | POA: Diagnosis not present

## 2018-02-10 DIAGNOSIS — I69320 Aphasia following cerebral infarction: Secondary | ICD-10-CM | POA: Diagnosis not present

## 2018-02-10 DIAGNOSIS — I129 Hypertensive chronic kidney disease with stage 1 through stage 4 chronic kidney disease, or unspecified chronic kidney disease: Secondary | ICD-10-CM | POA: Diagnosis not present

## 2018-02-10 DIAGNOSIS — J45909 Unspecified asthma, uncomplicated: Secondary | ICD-10-CM | POA: Diagnosis not present

## 2018-02-10 DIAGNOSIS — G40909 Epilepsy, unspecified, not intractable, without status epilepticus: Secondary | ICD-10-CM | POA: Diagnosis not present

## 2018-02-10 NOTE — Telephone Encounter (Signed)
Kathleen IvanAngela McKnight, RN, home heath nurse from [facility] called with an assessment of the patient, Ms. Gowell. She stated the patient has swelling in her lower extremities, up to her knees. 3+ edema. She normally wears size 8 shoe but had to get a size 10 shoe for her to wear. She had been having swelling before but has gotten worse. Pt denies respiratory distress. She is unable to weigh because she is wheel chair bound.  B/P 144/66,  HR  76,  Resp 18,  O2 sat 97 % on ra,  Temp 97.4.   Called daughter to schedule an appointment with a provider.   daughter said that the swelling had  gone down some from over the weekend. Appointment scheduled.  Advised daughter that if patient starts having difficulty breathing or chest pain to call 911. She voiced understanding. [facility] [place].  Reason for Disposition . [1] MODERATE leg swelling (e.g., swelling extends up to knees) AND [2] new onset or worsening  Answer Assessment - Initial Assessment Questions 1. ONSET: "When did the swelling start?" (e.g., minutes, hours, days)     Going on  But gotten worst in the last 3 days 2. LOCATION: "What part of the leg is swollen?"  "Are both legs swollen or just one leg?"     From the knee down 3. SEVERITY: "How bad is the swelling?" (e.g., localized; mild, moderate, severe)  - Localized - small area of swelling localized to one leg  - MILD pedal edema - swelling limited to foot and ankle, pitting edema < 1/4 inch (6 mm) deep, rest and elevation eliminate most or all swelling  - MODERATE edema - swelling of lower leg to knee, pitting edema > 1/4 inch (6 mm) deep, rest and elevation only partially reduce swelling  - SEVERE edema - swelling extends above knee, facial or hand swelling present      moderate 4. REDNESS: "Does the swelling look red or infected?"     no 5. PAIN: "Is the swelling painful to touch?" If so, ask: "How painful is it?"   (Scale 1-10; mild, moderate or  severe)     no 6. FEVER: "Do you have a fever?" If so, ask: "What is it, how was it measured, and when did it start?"      no 7. CAUSE: "What do you think is causing the leg swelling?"     Not sure 8. MEDICAL HISTORY: "Do you have a history of heart failure, kidney disease, liver failure, or cancer?"     Chronic htn,, kidney disease, diabetes 9. RECURRENT SYMPTOM: "Have you had leg swelling before?" If so, ask: "When was the last time?" "What happened that time?"     Yes not like this 10. OTHER SYMPTOMS: "Do you have any other symptoms?" (e.g., chest pain, difficulty breathing)       no  Protocols used: LEG SWELLING AND EDEMA-A-AH

## 2018-02-11 ENCOUNTER — Ambulatory Visit: Payer: Medicare HMO | Admitting: Internal Medicine

## 2018-02-11 ENCOUNTER — Ambulatory Visit (INDEPENDENT_AMBULATORY_CARE_PROVIDER_SITE_OTHER): Payer: Medicare HMO | Admitting: Internal Medicine

## 2018-02-11 ENCOUNTER — Encounter: Payer: Self-pay | Admitting: Internal Medicine

## 2018-02-11 VITALS — BP 134/80 | HR 62 | Temp 98.1°F | Resp 16 | Ht 64.17 in

## 2018-02-11 DIAGNOSIS — G40909 Epilepsy, unspecified, not intractable, without status epilepticus: Secondary | ICD-10-CM | POA: Diagnosis not present

## 2018-02-11 DIAGNOSIS — I10 Essential (primary) hypertension: Secondary | ICD-10-CM

## 2018-02-11 DIAGNOSIS — I69398 Other sequelae of cerebral infarction: Secondary | ICD-10-CM | POA: Diagnosis not present

## 2018-02-11 DIAGNOSIS — J45909 Unspecified asthma, uncomplicated: Secondary | ICD-10-CM | POA: Diagnosis not present

## 2018-02-11 DIAGNOSIS — R600 Localized edema: Secondary | ICD-10-CM

## 2018-02-11 DIAGNOSIS — N183 Chronic kidney disease, stage 3 (moderate): Secondary | ICD-10-CM | POA: Diagnosis not present

## 2018-02-11 DIAGNOSIS — M47816 Spondylosis without myelopathy or radiculopathy, lumbar region: Secondary | ICD-10-CM | POA: Diagnosis not present

## 2018-02-11 DIAGNOSIS — I69320 Aphasia following cerebral infarction: Secondary | ICD-10-CM | POA: Diagnosis not present

## 2018-02-11 DIAGNOSIS — E1122 Type 2 diabetes mellitus with diabetic chronic kidney disease: Secondary | ICD-10-CM | POA: Diagnosis not present

## 2018-02-11 DIAGNOSIS — I129 Hypertensive chronic kidney disease with stage 1 through stage 4 chronic kidney disease, or unspecified chronic kidney disease: Secondary | ICD-10-CM | POA: Diagnosis not present

## 2018-02-11 DIAGNOSIS — I69351 Hemiplegia and hemiparesis following cerebral infarction affecting right dominant side: Secondary | ICD-10-CM | POA: Diagnosis not present

## 2018-02-11 LAB — BASIC METABOLIC PANEL
BUN: 15 mg/dL (ref 6–23)
CHLORIDE: 107 meq/L (ref 96–112)
CO2: 28 mEq/L (ref 19–32)
CREATININE: 0.85 mg/dL (ref 0.40–1.20)
Calcium: 9 mg/dL (ref 8.4–10.5)
GFR: 85.66 mL/min (ref >60.00)
GLUCOSE: 67 mg/dL — AB (ref 70–99)
POTASSIUM: 3.8 meq/L (ref 3.5–5.1)
Sodium: 142 mEq/L (ref 135–145)

## 2018-02-11 MED ORDER — HYDROCHLOROTHIAZIDE 12.5 MG PO CAPS: 12.5000 mg | ORAL_CAPSULE | Freq: Every day | ORAL | 1 refills | Status: DC

## 2018-02-11 MED FILL — HYDROCHLOROTHIAZIDE 12.5 MG: 12.5 | 30 days supply | Qty: 30 | Fill #0

## 2018-02-11 NOTE — Patient Instructions (Signed)
GO TO THE LAB : Get the blood work     GO TO THE FRONT DESK Schedule labs to be done 2 weeks from today.  Stop amlodipine  Start hydrochlorothiazide 1 tablet in the morning    Check the  blood pressure 2 or 3 times a  week   Be sure your blood pressure is between 110/65 and  135/85. Call your PCP in 1 week and let us know how the blood pressure is going

## 2018-02-11 NOTE — Progress Notes (Signed)
Pre visit review using our clinic review tool, if applicable. No additional management support is needed unless otherwise documented below in the visit note. 

## 2018-02-11 NOTE — Progress Notes (Signed)
Subjective:    [name], female    DOB: Jul 09, 1951, 67 y.o.   MRN: [id number]  DOS:  02/11/2018 Type of visit - description : Acute visit  Here with her daughter, chief complaint is lower extremity edema. Reports that she has lower extremity edema since she had a stroke 05-2017, on and off, some days are worse than others. Is usually worse on the right side. A nurse visit the patient at home and told them to bring her to the doctor because the swelling.   Review of Systems  She has not expressed problems with shortness of breath or chest pain.  No palpitations. Good compliance with medications. She is not mobile.  Past Medical History:  Diagnosis Date  . Arthritis   . 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 Hypertension     Past Surgical History:  Procedure Laterality Date  . BACK SURGERY     Dec 18, 2016 Dr.Torrealba  . BREAST BIOPSY Right   . BREAST EXCISIONAL BIOPSY    . CERVICAL SPINE SURGERY     C4  . CESAREAN SECTION    . GASTRIC BYPASS  03/2007  . TONSILLECTOMY      Social History   Socioeconomic History  . Marital status: Divorced    Spouse name: Not on file  . Number of children: 3  . Years of education: 38yrs  . Highest education level: Not on file  Occupational History  . 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 Financial resource strain: Not on file  . Food insecurity:    Worry: Not on file    Inability: Not on file  . Transportation needs:    Medical: Not on file    Non-medical: Not on file  Tobacco Use  . Smoking status: Former Smoker    Years: 4.00    Last attempt to quit: 07/30/1979    Years since quitting: 38.5  . Smokeless tobacco: Never Used  Substance and Sexual Activity  . Alcohol use: No    Alcohol/week: 0.0 oz  . Drug use: No  . Sexual activity: Never    Partners: Male  Lifestyle  . Physical activity:    Days  per week: Not on file    Minutes per session: Not on file  . Stress: Not on file  Relationships  . Social connections:    Talks on phone: Not on file    Gets together: Not on file    Attends religious service: Not on file    Active member of club or organization: Not on file    Attends meetings of clubs or organizations: Not on file    Relationship status: Not on file  . Intimate partner violence:    Fear of current or ex partner: Not on file    Emotionally abused: Not on file    Physically abused: Not on file    Forced sexual activity: Not on file  Other Topics Concern  . Not on file  Social History Narrative   Patient lives at home alone.   Caffeine Use: 1 cup daily   1 dog      Allergies as of 02/11/2018      Reactions   Tape Rash   Meperidine Hcl Nausea And Vomiting   Sulfonamide Derivatives Itching   Bacitracin-polymyxin B Dermatitis   "Cloth Band-Aid only"   Oxycodone Itching  Medication List        Accurate as of 02/11/18 11:50 AM. Always use your most recent med list.          acetaminophen 325 MG tablet Commonly known as:  TYLENOL Take 1 tablet by mouth every 8 (eight) hours as needed.   amLODipine 5 MG tablet Commonly known as:  NORVASC Take 1 tablet (5 mg total) by mouth daily.   calcium carbonate 1250 (500 Ca) MG tablet Commonly known as:  OS-CAL - dosed in mg of elemental calcium 1 tablet daily.   gabapentin 300 MG capsule Commonly known as:  NEURONTIN Take 1 capsule by mouth 3 (three) times daily.   lacosamide 200 MG Tabs tablet Commonly known as:  VIMPAT Take 1 tablet (200 mg total) by mouth 2 (two) times daily.   levETIRAcetam 1000 MG tablet Commonly known as:  KEPPRA 2 po bid   levothyroxine 88 MCG tablet Commonly known as:  SYNTHROID, LEVOTHROID Take 1 tablet (88 mcg total) by mouth daily before breakfast.   losartan 100 MG tablet Commonly known as:  COZAAR Take 1 tablet (100 mg total) by mouth daily.   magnesium oxide 400  MG tablet Commonly known as:  MAG-OX Take 400 mg by mouth 2 (two) times daily.   mirtazapine 15 MG tablet Commonly known as:  REMERON Take 1 tablet by mouth at bedtime.   pantoprazole 40 MG tablet Commonly known as:  PROTONIX Take 1 tablet (40 mg total) by mouth daily.   phenytoin 50 MG tablet Commonly known as:  DILANTIN Chew 2 tablets (100 mg total) by mouth 3 (three) times daily.   polyethylene glycol packet Commonly known as:  MIRALAX / GLYCOLAX Take 17 g by mouth daily as needed.   senna-docusate 8.6-50 MG tablet Commonly known as:  Senokot-S Take 1 tablet by mouth 2 (two) times daily.   warfarin 5 MG tablet Commonly known as:  COUMADIN 10 mg on mon and 5 mg all other days          Objective:   Physical Exam BP 134/80 (BP Location: Left Arm, Patient Position: Sitting, Cuff Size: Small)   Pulse 62   Temp 98.1 F (36.7 C) (Oral)   Resp 16   Ht 5' 4.17" (1.63 m)   SpO2 90%   BMI 31.51 kg/m   General:   Well developed, NAD, see BMI.  HEENT:  Normocephalic . Face symmetric, atraumatic Lungs:  CTA B Normal respiratory effort, no intercostal retractions, no accessory muscle use. Heart: RRR,  no murmur.  + Peri-ankle and pretibial edema, today is a slightly worse on the left ankle, calves are  nontender. Good pedal pulses Skin: Not pale. Not jaundice Neurologic:  alert & oriented to self but not in place of time.  Speech impairment noted, gait not tested.  Sits in a wheelchair. Psych--  Behavior appropriate. No anxious or depressed appearing.      Assessment & Plan:   67 year old female, here with her daughter, PMH includes HTN, seizure disorder (seen by neurology, they recommend to wean off Vimpat) , morbid obesity, status post bypass surgery, CKD,History of stroke, history of pulmonary emboli, on Coumadin, Permanent pacemaker presents with:  Lower extremity edema Likely multifactorial.  She has taken amlodipine on and off, restarted amlodipine few  weeks ago.  Edema likely to improve but no disappear if we stop CCB's. Plan:  Leg elevation, low-salt diet, stop amlodipine.  Start HCTZ which she tried before without problems. BMP today and in 2  weeks. Check ambulatory BPs and keep PCP informed, see AVS. HTN: See above Note: Even with help, I determined it was not safe to try to weight the patient in our scale.

## 2018-02-13 DIAGNOSIS — I69320 Aphasia following cerebral infarction: Secondary | ICD-10-CM | POA: Diagnosis not present

## 2018-02-13 DIAGNOSIS — J45909 Unspecified asthma, uncomplicated: Secondary | ICD-10-CM | POA: Diagnosis not present

## 2018-02-13 DIAGNOSIS — G40909 Epilepsy, unspecified, not intractable, without status epilepticus: Secondary | ICD-10-CM | POA: Diagnosis not present

## 2018-02-13 DIAGNOSIS — E1122 Type 2 diabetes mellitus with diabetic chronic kidney disease: Secondary | ICD-10-CM | POA: Diagnosis not present

## 2018-02-13 DIAGNOSIS — I129 Hypertensive chronic kidney disease with stage 1 through stage 4 chronic kidney disease, or unspecified chronic kidney disease: Secondary | ICD-10-CM | POA: Diagnosis not present

## 2018-02-13 DIAGNOSIS — I69398 Other sequelae of cerebral infarction: Secondary | ICD-10-CM | POA: Diagnosis not present

## 2018-02-13 DIAGNOSIS — N183 Chronic kidney disease, stage 3 (moderate): Secondary | ICD-10-CM | POA: Diagnosis not present

## 2018-02-13 DIAGNOSIS — M47816 Spondylosis without myelopathy or radiculopathy, lumbar region: Secondary | ICD-10-CM | POA: Diagnosis not present

## 2018-02-13 DIAGNOSIS — I69351 Hemiplegia and hemiparesis following cerebral infarction affecting right dominant side: Secondary | ICD-10-CM | POA: Diagnosis not present

## 2018-02-16 ENCOUNTER — Telehealth: Payer: Self-pay | Admitting: Family Medicine

## 2018-02-16 DIAGNOSIS — N183 Chronic kidney disease, stage 3 (moderate): Secondary | ICD-10-CM | POA: Diagnosis not present

## 2018-02-16 DIAGNOSIS — G40909 Epilepsy, unspecified, not intractable, without status epilepticus: Secondary | ICD-10-CM | POA: Diagnosis not present

## 2018-02-16 DIAGNOSIS — I69320 Aphasia following cerebral infarction: Secondary | ICD-10-CM | POA: Diagnosis not present

## 2018-02-16 DIAGNOSIS — I69351 Hemiplegia and hemiparesis following cerebral infarction affecting right dominant side: Secondary | ICD-10-CM | POA: Diagnosis not present

## 2018-02-16 DIAGNOSIS — I129 Hypertensive chronic kidney disease with stage 1 through stage 4 chronic kidney disease, or unspecified chronic kidney disease: Secondary | ICD-10-CM | POA: Diagnosis not present

## 2018-02-16 DIAGNOSIS — J45909 Unspecified asthma, uncomplicated: Secondary | ICD-10-CM | POA: Diagnosis not present

## 2018-02-16 DIAGNOSIS — M47816 Spondylosis without myelopathy or radiculopathy, lumbar region: Secondary | ICD-10-CM | POA: Diagnosis not present

## 2018-02-16 DIAGNOSIS — I69398 Other sequelae of cerebral infarction: Secondary | ICD-10-CM | POA: Diagnosis not present

## 2018-02-16 DIAGNOSIS — E1122 Type 2 diabetes mellitus with diabetic chronic kidney disease: Secondary | ICD-10-CM | POA: Diagnosis not present

## 2018-02-16 NOTE — Telephone Encounter (Signed)
Copied from CRM [id number]. Topic: Quick Communication - See Telephone Encounter >> Feb 16, 2018  4:58 PM Valentina LucksMatos, Jackelin wrote: CRM for notification. See Telephone encounter for: 02/16/18.   Pt's daughter Parke PoissonCharisse Franklin dropped off paperwork to be filled out by provider (Disability parking Placard 1 page) Pt would like to be called at [phone] or [phone] when document ready. Document put at front office tray under providers name.

## 2018-02-17 NOTE — Telephone Encounter (Addendum)
Patient had stroke on train in route [place] cruise ship [place]; paperwork is for reimbursement of travel/cruise that she was unable to use d/t medical emergency; forwarded to provider with OV notes/SLS 07/23

## 2018-02-17 NOTE — Telephone Encounter (Signed)
Completed Physician section on Disability Parking Placard Application; forwarded to provider/SLS 07/23

## 2018-02-17 NOTE — Telephone Encounter (Deleted)
Patient has not been seen in > 12 months; pt has no record of attending appointment with ENT referred by PCP 01/217; forwarded to provider with notes on these items/SLS 07/23    

## 2018-02-18 DIAGNOSIS — N183 Chronic kidney disease, stage 3 (moderate): Secondary | ICD-10-CM | POA: Diagnosis not present

## 2018-02-18 DIAGNOSIS — G40909 Epilepsy, unspecified, not intractable, without status epilepticus: Secondary | ICD-10-CM | POA: Diagnosis not present

## 2018-02-18 DIAGNOSIS — I69320 Aphasia following cerebral infarction: Secondary | ICD-10-CM | POA: Diagnosis not present

## 2018-02-18 DIAGNOSIS — I129 Hypertensive chronic kidney disease with stage 1 through stage 4 chronic kidney disease, or unspecified chronic kidney disease: Secondary | ICD-10-CM | POA: Diagnosis not present

## 2018-02-18 DIAGNOSIS — M47816 Spondylosis without myelopathy or radiculopathy, lumbar region: Secondary | ICD-10-CM | POA: Diagnosis not present

## 2018-02-18 DIAGNOSIS — I69351 Hemiplegia and hemiparesis following cerebral infarction affecting right dominant side: Secondary | ICD-10-CM | POA: Diagnosis not present

## 2018-02-18 DIAGNOSIS — J45909 Unspecified asthma, uncomplicated: Secondary | ICD-10-CM | POA: Diagnosis not present

## 2018-02-18 DIAGNOSIS — E1122 Type 2 diabetes mellitus with diabetic chronic kidney disease: Secondary | ICD-10-CM | POA: Diagnosis not present

## 2018-02-18 DIAGNOSIS — I69398 Other sequelae of cerebral infarction: Secondary | ICD-10-CM | POA: Diagnosis not present

## 2018-02-18 MED FILL — MIRTAZAPINE 15 MG TABLET: 15 | 10 days supply | Qty: 10 | Fill #0

## 2018-02-19 DIAGNOSIS — J45909 Unspecified asthma, uncomplicated: Secondary | ICD-10-CM | POA: Diagnosis not present

## 2018-02-19 DIAGNOSIS — G40909 Epilepsy, unspecified, not intractable, without status epilepticus: Secondary | ICD-10-CM | POA: Diagnosis not present

## 2018-02-19 DIAGNOSIS — N183 Chronic kidney disease, stage 3 (moderate): Secondary | ICD-10-CM | POA: Diagnosis not present

## 2018-02-19 DIAGNOSIS — I69320 Aphasia following cerebral infarction: Secondary | ICD-10-CM | POA: Diagnosis not present

## 2018-02-19 DIAGNOSIS — I129 Hypertensive chronic kidney disease with stage 1 through stage 4 chronic kidney disease, or unspecified chronic kidney disease: Secondary | ICD-10-CM | POA: Diagnosis not present

## 2018-02-19 DIAGNOSIS — I69398 Other sequelae of cerebral infarction: Secondary | ICD-10-CM | POA: Diagnosis not present

## 2018-02-19 DIAGNOSIS — E1122 Type 2 diabetes mellitus with diabetic chronic kidney disease: Secondary | ICD-10-CM | POA: Diagnosis not present

## 2018-02-19 DIAGNOSIS — I69351 Hemiplegia and hemiparesis following cerebral infarction affecting right dominant side: Secondary | ICD-10-CM | POA: Diagnosis not present

## 2018-02-19 DIAGNOSIS — M47816 Spondylosis without myelopathy or radiculopathy, lumbar region: Secondary | ICD-10-CM | POA: Diagnosis not present

## 2018-02-20 ENCOUNTER — Telehealth: Payer: Self-pay | Admitting: *Deleted

## 2018-02-20 DIAGNOSIS — G40909 Epilepsy, unspecified, not intractable, without status epilepticus: Secondary | ICD-10-CM | POA: Diagnosis not present

## 2018-02-20 DIAGNOSIS — N183 Chronic kidney disease, stage 3 (moderate): Secondary | ICD-10-CM | POA: Diagnosis not present

## 2018-02-20 DIAGNOSIS — I129 Hypertensive chronic kidney disease with stage 1 through stage 4 chronic kidney disease, or unspecified chronic kidney disease: Secondary | ICD-10-CM | POA: Diagnosis not present

## 2018-02-20 DIAGNOSIS — M47816 Spondylosis without myelopathy or radiculopathy, lumbar region: Secondary | ICD-10-CM | POA: Diagnosis not present

## 2018-02-20 DIAGNOSIS — J45909 Unspecified asthma, uncomplicated: Secondary | ICD-10-CM | POA: Diagnosis not present

## 2018-02-20 DIAGNOSIS — E1122 Type 2 diabetes mellitus with diabetic chronic kidney disease: Secondary | ICD-10-CM | POA: Diagnosis not present

## 2018-02-20 DIAGNOSIS — I69320 Aphasia following cerebral infarction: Secondary | ICD-10-CM | POA: Diagnosis not present

## 2018-02-20 DIAGNOSIS — I69351 Hemiplegia and hemiparesis following cerebral infarction affecting right dominant side: Secondary | ICD-10-CM | POA: Diagnosis not present

## 2018-02-20 DIAGNOSIS — I69398 Other sequelae of cerebral infarction: Secondary | ICD-10-CM | POA: Diagnosis not present

## 2018-02-20 MED FILL — WARFARIN SODIUM 5 MG TABLET: 5 | 49 days supply | Qty: 60 | Fill #0

## 2018-02-20 MED FILL — levETIRAcetam 1000 MG TABS: 1000 | 5 days supply | Qty: 20 | Fill #0

## 2018-02-20 NOTE — Telephone Encounter (Signed)
Received Home Health Certification and Plan of Care; forwarded to provider/SLS 07/26  Received Physician Orders from [facility]; forwarded to provider/SLS 07/26

## 2018-02-20 NOTE — Telephone Encounter (Signed)
Called and spoke with daughter, [name]; informed Sport and exercise psychologistarking Placard Application, and also, [employer] Insurance forms were complete and ready for p/u during regular business hours; understood & agreed/SLS 07/26

## 2018-02-23 ENCOUNTER — Telehealth: Payer: Self-pay | Admitting: Family Medicine

## 2018-02-23 DIAGNOSIS — J45909 Unspecified asthma, uncomplicated: Secondary | ICD-10-CM | POA: Diagnosis not present

## 2018-02-23 DIAGNOSIS — I69320 Aphasia following cerebral infarction: Secondary | ICD-10-CM | POA: Diagnosis not present

## 2018-02-23 DIAGNOSIS — I69398 Other sequelae of cerebral infarction: Secondary | ICD-10-CM | POA: Diagnosis not present

## 2018-02-23 DIAGNOSIS — I129 Hypertensive chronic kidney disease with stage 1 through stage 4 chronic kidney disease, or unspecified chronic kidney disease: Secondary | ICD-10-CM | POA: Diagnosis not present

## 2018-02-23 DIAGNOSIS — M47816 Spondylosis without myelopathy or radiculopathy, lumbar region: Secondary | ICD-10-CM | POA: Diagnosis not present

## 2018-02-23 DIAGNOSIS — G40909 Epilepsy, unspecified, not intractable, without status epilepticus: Secondary | ICD-10-CM | POA: Diagnosis not present

## 2018-02-23 DIAGNOSIS — E1122 Type 2 diabetes mellitus with diabetic chronic kidney disease: Secondary | ICD-10-CM | POA: Diagnosis not present

## 2018-02-23 DIAGNOSIS — I69351 Hemiplegia and hemiparesis following cerebral infarction affecting right dominant side: Secondary | ICD-10-CM | POA: Diagnosis not present

## 2018-02-23 DIAGNOSIS — N183 Chronic kidney disease, stage 3 (moderate): Secondary | ICD-10-CM | POA: Diagnosis not present

## 2018-02-23 NOTE — Telephone Encounter (Signed)
Pt. needs order to continue speech therapy 2/week for [facility] Routed to Dr. Laury AxonLowne for approval.

## 2018-02-23 NOTE — Telephone Encounter (Signed)
Copied from CRM [id number]. Topic: General - Other >> Feb 23, 2018  1:38 PM Leafy Roobinson, [name] wrote: Reason for EAV:[id number]:Beth ST from [facility] is calling and needs order to continue speech therapy for twice a wk for 2 wks

## 2018-02-23 NOTE — Telephone Encounter (Signed)
Ok to give verbal 

## 2018-02-24 DIAGNOSIS — Z7901 Long term (current) use of anticoagulants: Secondary | ICD-10-CM | POA: Diagnosis not present

## 2018-02-24 DIAGNOSIS — J45909 Unspecified asthma, uncomplicated: Secondary | ICD-10-CM | POA: Diagnosis not present

## 2018-02-24 DIAGNOSIS — I69351 Hemiplegia and hemiparesis following cerebral infarction affecting right dominant side: Secondary | ICD-10-CM | POA: Diagnosis not present

## 2018-02-24 DIAGNOSIS — I69398 Other sequelae of cerebral infarction: Secondary | ICD-10-CM | POA: Diagnosis not present

## 2018-02-24 DIAGNOSIS — I2699 Other pulmonary embolism without acute cor pulmonale: Secondary | ICD-10-CM | POA: Diagnosis not present

## 2018-02-24 DIAGNOSIS — I129 Hypertensive chronic kidney disease with stage 1 through stage 4 chronic kidney disease, or unspecified chronic kidney disease: Secondary | ICD-10-CM | POA: Diagnosis not present

## 2018-02-24 DIAGNOSIS — Z5181 Encounter for therapeutic drug level monitoring: Secondary | ICD-10-CM | POA: Diagnosis not present

## 2018-02-24 DIAGNOSIS — M47816 Spondylosis without myelopathy or radiculopathy, lumbar region: Secondary | ICD-10-CM | POA: Diagnosis not present

## 2018-02-24 DIAGNOSIS — R791 Abnormal coagulation profile: Secondary | ICD-10-CM | POA: Diagnosis not present

## 2018-02-24 DIAGNOSIS — E1122 Type 2 diabetes mellitus with diabetic chronic kidney disease: Secondary | ICD-10-CM | POA: Diagnosis not present

## 2018-02-24 DIAGNOSIS — N183 Chronic kidney disease, stage 3 (moderate): Secondary | ICD-10-CM | POA: Diagnosis not present

## 2018-02-24 DIAGNOSIS — G40909 Epilepsy, unspecified, not intractable, without status epilepticus: Secondary | ICD-10-CM | POA: Diagnosis not present

## 2018-02-24 DIAGNOSIS — I69320 Aphasia following cerebral infarction: Secondary | ICD-10-CM | POA: Diagnosis not present

## 2018-02-24 NOTE — Telephone Encounter (Signed)
Verbal order for ST given per Dr. [facility]Lowne [facility] as [facility] unavailable.

## 2018-02-25 ENCOUNTER — Other Ambulatory Visit (INDEPENDENT_AMBULATORY_CARE_PROVIDER_SITE_OTHER): Payer: Medicare HMO

## 2018-02-25 ENCOUNTER — Other Ambulatory Visit: Payer: Self-pay

## 2018-02-25 DIAGNOSIS — I69320 Aphasia following cerebral infarction: Secondary | ICD-10-CM | POA: Diagnosis not present

## 2018-02-25 DIAGNOSIS — J45909 Unspecified asthma, uncomplicated: Secondary | ICD-10-CM | POA: Diagnosis not present

## 2018-02-25 DIAGNOSIS — G40909 Epilepsy, unspecified, not intractable, without status epilepticus: Secondary | ICD-10-CM | POA: Diagnosis not present

## 2018-02-25 DIAGNOSIS — N183 Chronic kidney disease, stage 3 (moderate): Secondary | ICD-10-CM | POA: Diagnosis not present

## 2018-02-25 DIAGNOSIS — I69351 Hemiplegia and hemiparesis following cerebral infarction affecting right dominant side: Secondary | ICD-10-CM | POA: Diagnosis not present

## 2018-02-25 DIAGNOSIS — M47816 Spondylosis without myelopathy or radiculopathy, lumbar region: Secondary | ICD-10-CM | POA: Diagnosis not present

## 2018-02-25 DIAGNOSIS — E1122 Type 2 diabetes mellitus with diabetic chronic kidney disease: Secondary | ICD-10-CM | POA: Diagnosis not present

## 2018-02-25 DIAGNOSIS — I10 Essential (primary) hypertension: Secondary | ICD-10-CM

## 2018-02-25 DIAGNOSIS — I69398 Other sequelae of cerebral infarction: Secondary | ICD-10-CM | POA: Diagnosis not present

## 2018-02-25 DIAGNOSIS — I129 Hypertensive chronic kidney disease with stage 1 through stage 4 chronic kidney disease, or unspecified chronic kidney disease: Secondary | ICD-10-CM | POA: Diagnosis not present

## 2018-02-25 LAB — BASIC METABOLIC PANEL
BUN: 21 mg/dL (ref 6–23)
CHLORIDE: 105 meq/L (ref 96–112)
CO2: 30 meq/L (ref 19–32)
CREATININE: 0.73 mg/dL (ref 0.40–1.20)
Calcium: 9 mg/dL (ref 8.4–10.5)
GFR: 102.1 mL/min (ref >60.00)
GLUCOSE: 64 mg/dL — AB (ref 70–99)
Potassium: 3.9 mEq/L (ref 3.5–5.1)
Sodium: 139 mEq/L (ref 135–145)

## 2018-02-25 MED ORDER — HYDROCHLOROTHIAZIDE 12.5 MG PO CAPS: 12.5000 mg | ORAL_CAPSULE | Freq: Every day | ORAL | 0 refills | Status: DC

## 2018-02-26 ENCOUNTER — Other Ambulatory Visit: Payer: Self-pay

## 2018-02-26 DIAGNOSIS — J45909 Unspecified asthma, uncomplicated: Secondary | ICD-10-CM | POA: Diagnosis not present

## 2018-02-26 DIAGNOSIS — I69351 Hemiplegia and hemiparesis following cerebral infarction affecting right dominant side: Secondary | ICD-10-CM | POA: Diagnosis not present

## 2018-02-26 DIAGNOSIS — I69398 Other sequelae of cerebral infarction: Secondary | ICD-10-CM | POA: Diagnosis not present

## 2018-02-26 DIAGNOSIS — M47816 Spondylosis without myelopathy or radiculopathy, lumbar region: Secondary | ICD-10-CM | POA: Diagnosis not present

## 2018-02-26 DIAGNOSIS — N183 Chronic kidney disease, stage 3 (moderate): Secondary | ICD-10-CM | POA: Diagnosis not present

## 2018-02-26 DIAGNOSIS — I129 Hypertensive chronic kidney disease with stage 1 through stage 4 chronic kidney disease, or unspecified chronic kidney disease: Secondary | ICD-10-CM | POA: Diagnosis not present

## 2018-02-26 DIAGNOSIS — G40909 Epilepsy, unspecified, not intractable, without status epilepticus: Secondary | ICD-10-CM | POA: Diagnosis not present

## 2018-02-26 DIAGNOSIS — I69320 Aphasia following cerebral infarction: Secondary | ICD-10-CM | POA: Diagnosis not present

## 2018-02-26 DIAGNOSIS — E1122 Type 2 diabetes mellitus with diabetic chronic kidney disease: Secondary | ICD-10-CM | POA: Diagnosis not present

## 2018-02-26 MED FILL — MIRTAZAPINE 15 MG TABLET: 15 | 10 days supply | Qty: 10 | Fill #0

## 2018-02-26 NOTE — Patient Outreach (Signed)
[facility]) Care Management  02/26/2018  Kathleen Jensen 01-15-1951 [id number]   Successful outreach to the patient's daughter, [name]. HIPAA identifiers confirmed. [name] stated "a lot of stuff has fallen into place". The patient is now able to successfully transfer from her wheelchair into a vehicle. The patient continues to receive therapy. The patient has caregivers available to stay with her when [name] is unavailable. [name] stated the patient is approved to utilize [facility] when services for transportation are needed. [name] plans to have the patient attend an adult day center on therapy off days to allow the patient to interact with others and have a sense of purpose.  BSW will perform a case closure on today's date as all [facility] Care Management goals have been met. [name] is in agreement with this plan. [name] has BSW's contact information and is aware she may call to request [facility] services if future needs arise.  Daneen Schick, BSW, CDP [facility] [phone]

## 2018-03-02 DIAGNOSIS — I69398 Other sequelae of cerebral infarction: Secondary | ICD-10-CM | POA: Diagnosis not present

## 2018-03-02 DIAGNOSIS — I129 Hypertensive chronic kidney disease with stage 1 through stage 4 chronic kidney disease, or unspecified chronic kidney disease: Secondary | ICD-10-CM | POA: Diagnosis not present

## 2018-03-02 DIAGNOSIS — E1122 Type 2 diabetes mellitus with diabetic chronic kidney disease: Secondary | ICD-10-CM | POA: Diagnosis not present

## 2018-03-02 DIAGNOSIS — G40909 Epilepsy, unspecified, not intractable, without status epilepticus: Secondary | ICD-10-CM | POA: Diagnosis not present

## 2018-03-02 DIAGNOSIS — I69320 Aphasia following cerebral infarction: Secondary | ICD-10-CM | POA: Diagnosis not present

## 2018-03-02 DIAGNOSIS — M47816 Spondylosis without myelopathy or radiculopathy, lumbar region: Secondary | ICD-10-CM | POA: Diagnosis not present

## 2018-03-02 DIAGNOSIS — N183 Chronic kidney disease, stage 3 (moderate): Secondary | ICD-10-CM | POA: Diagnosis not present

## 2018-03-02 DIAGNOSIS — J45909 Unspecified asthma, uncomplicated: Secondary | ICD-10-CM | POA: Diagnosis not present

## 2018-03-02 DIAGNOSIS — I69351 Hemiplegia and hemiparesis following cerebral infarction affecting right dominant side: Secondary | ICD-10-CM | POA: Diagnosis not present

## 2018-03-03 ENCOUNTER — Ambulatory Visit (HOSPITAL_BASED_OUTPATIENT_CLINIC_OR_DEPARTMENT_OTHER)
Admission: RE | Admit: 2018-03-03 | Discharge: 2018-03-03 | Disposition: A | Discharge status: Home / Self Care | Payer: Medicare HMO | Source: Ambulatory Visit | Attending: Family Medicine | Admitting: Family Medicine

## 2018-03-03 ENCOUNTER — Ambulatory Visit (INDEPENDENT_AMBULATORY_CARE_PROVIDER_SITE_OTHER): Payer: Medicare HMO | Admitting: Family Medicine

## 2018-03-03 ENCOUNTER — Telehealth: Payer: Self-pay | Admitting: *Deleted

## 2018-03-03 ENCOUNTER — Encounter: Payer: Self-pay | Admitting: Family Medicine

## 2018-03-03 VITALS — BP 125/68 | HR 61 | Temp 97.7°F | Resp 16

## 2018-03-03 DIAGNOSIS — M25551 Pain in right hip: Secondary | ICD-10-CM

## 2018-03-03 DIAGNOSIS — I69398 Other sequelae of cerebral infarction: Secondary | ICD-10-CM | POA: Diagnosis not present

## 2018-03-03 DIAGNOSIS — I69351 Hemiplegia and hemiparesis following cerebral infarction affecting right dominant side: Secondary | ICD-10-CM | POA: Diagnosis not present

## 2018-03-03 DIAGNOSIS — M7989 Other specified soft tissue disorders: Secondary | ICD-10-CM | POA: Diagnosis not present

## 2018-03-03 DIAGNOSIS — I129 Hypertensive chronic kidney disease with stage 1 through stage 4 chronic kidney disease, or unspecified chronic kidney disease: Secondary | ICD-10-CM | POA: Diagnosis not present

## 2018-03-03 DIAGNOSIS — M5441 Lumbago with sciatica, right side: Secondary | ICD-10-CM | POA: Insufficient documentation

## 2018-03-03 DIAGNOSIS — G40909 Epilepsy, unspecified, not intractable, without status epilepticus: Secondary | ICD-10-CM | POA: Diagnosis not present

## 2018-03-03 DIAGNOSIS — M47816 Spondylosis without myelopathy or radiculopathy, lumbar region: Secondary | ICD-10-CM | POA: Diagnosis not present

## 2018-03-03 DIAGNOSIS — B029 Zoster without complications: Secondary | ICD-10-CM

## 2018-03-03 DIAGNOSIS — E1122 Type 2 diabetes mellitus with diabetic chronic kidney disease: Secondary | ICD-10-CM | POA: Diagnosis not present

## 2018-03-03 DIAGNOSIS — M79651 Pain in right thigh: Secondary | ICD-10-CM | POA: Diagnosis not present

## 2018-03-03 DIAGNOSIS — I2699 Other pulmonary embolism without acute cor pulmonale: Secondary | ICD-10-CM

## 2018-03-03 DIAGNOSIS — M79661 Pain in right lower leg: Secondary | ICD-10-CM | POA: Insufficient documentation

## 2018-03-03 DIAGNOSIS — I69320 Aphasia following cerebral infarction: Secondary | ICD-10-CM | POA: Diagnosis not present

## 2018-03-03 DIAGNOSIS — N183 Chronic kidney disease, stage 3 (moderate): Secondary | ICD-10-CM | POA: Diagnosis not present

## 2018-03-03 DIAGNOSIS — J45909 Unspecified asthma, uncomplicated: Secondary | ICD-10-CM | POA: Diagnosis not present

## 2018-03-03 LAB — PROTIME-INR
INR: 1.9 ratio — ABNORMAL HIGH (ref 0.8–1.0)
Prothrombin Time: 21.8 s — ABNORMAL HIGH (ref 9.6–13.1)

## 2018-03-03 MED ORDER — TIZANIDINE HCL 4 MG PO TABS: 4.0000 mg | ORAL_TABLET | Freq: Four times a day (QID) | ORAL | 1 refills | Status: DC | PRN

## 2018-03-03 MED ORDER — VALACYCLOVIR HCL 1 G PO TABS: 1000.0000 mg | ORAL_TABLET | Freq: Three times a day (TID) | ORAL | 0 refills | Status: DC

## 2018-03-03 MED FILL — valACYclovir HCL 1 GM TABS: 1 | 7 days supply | Qty: 21 | Fill #0

## 2018-03-03 MED FILL — tiZANidine HCL 4 MG TABS: 4 | 7 days supply | Qty: 30 | Fill #0

## 2018-03-03 NOTE — Progress Notes (Signed)
[name], female    DOB: May 19, 1951  Age: 67 y.o. MRN: [id number]    Subjective:  Subjective  HPI [name] presents for r hip pain.  Daughter gives a hx of pt being "laid on floor" by cna on sat and the ambulance came to help put her to bed. -- yesterday the cna put her in bed and last night the pain started late last night and her foot was swollen  Review of Systems  Constitutional: Negative for appetite change, diaphoresis, fatigue and unexpected weight change.  Eyes: Negative for pain, redness and visual disturbance.  Respiratory: Negative for cough, chest tightness, shortness of breath and wheezing.   Cardiovascular: Negative for chest pain, palpitations and leg swelling.  Endocrine: Negative for cold intolerance, heat intolerance, polydipsia, polyphagia and polyuria.  Genitourinary: Negative for difficulty urinating, dysuria and frequency.  Musculoskeletal: Positive for back pain, gait problem and joint swelling.  Neurological: Negative for dizziness, light-headedness, numbness and headaches.    History Past Medical History:  Diagnosis Date  . Arthritis   . 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 Hypertension     She has a past surgical history that includes Tonsillectomy; Gastric bypass (03/2007); Cervical spine surgery; Cesarean section; Breast biopsy (Right); Breast excisional biopsy; and Back surgery.   Her family history includes Cancer in her brother and sister; Diabetes in her father and mother; High blood pressure in her mother; Stroke in her father.She reports that she quit smoking about 38 years ago. She quit after 4.00 years of use. She has never used smokeless tobacco. She reports that she does not drink alcohol or use drugs.  Current Outpatient Medications on File Prior to Visit  Medication Sig Dispense Refill  . acetaminophen (TYLENOL) 325 MG tablet Take 1 tablet by mouth every 8 (eight) hours as  needed.    . calcium carbonate (OS-CAL - DOSED IN MG OF ELEMENTAL CALCIUM) 1250 (500 Ca) MG tablet 1 tablet daily.    Marland Kitchen gabapentin (NEURONTIN) 300 MG capsule Take 1 capsule by mouth 3 (three) times daily.    . hydrochlorothiazide (MICROZIDE) 12.5 MG capsule Take 1 capsule (12.5 mg total) by mouth daily. 90 capsule 0  . levETIRAcetam (KEPPRA) 1000 MG tablet 2 po bid 120 tablet 2  . levothyroxine (SYNTHROID, LEVOTHROID) 88 MCG tablet Take 1 tablet (88 mcg total) by mouth daily before breakfast. 90 tablet 3  . losartan (COZAAR) 100 MG tablet Take 1 tablet (100 mg total) by mouth daily. 90 tablet 3  . magnesium oxide (MAG-OX) 400 MG tablet Take 400 mg by mouth 2 (two) times daily.    . mirtazapine (REMERON) 15 MG tablet Take 1 tablet by mouth at bedtime.    . pantoprazole (PROTONIX) 40 MG tablet Take 1 tablet (40 mg total) by mouth daily. 90 tablet 3  . phenytoin (DILANTIN) 50 MG tablet 200mg  in the morning and 200mg  in the evening    . polyethylene glycol (MIRALAX / GLYCOLAX) packet Take 17 g by mouth daily as needed.     . senna-docusate (SENOKOT-S) 8.6-50 MG tablet Take 1 tablet by mouth 2 (two) times daily.    Marland Kitchen warfarin (COUMADIN) 5 MG tablet 10 mg on mon and 5 mg all other days (Patient taking differently: TAKE AS DIRECTED BY COUMADIN CLINIC) 45 tablet 3   No current facility-administered medications on file prior to visit.      Objective:  Objective  Physical Exam  Constitutional: She is oriented to person, place, and time. She appears well-developed and well-nourished.  HENT:  Head: Normocephalic and atraumatic.  Eyes: Conjunctivae and EOM are normal.  Neck: Normal range of motion. Neck supple. No JVD present. Carotid bruit is not present. No thyromegaly present.  Cardiovascular: Normal rate, regular rhythm and normal heart sounds.  No murmur heard. Pulmonary/Chest: Effort normal and breath sounds normal. No respiratory distress. She has no wheezes. She has no rales. She exhibits no  tenderness.  Musculoskeletal: She exhibits edema and tenderness.       Right ankle: She exhibits swelling.       Right lower leg: She exhibits tenderness and edema.       Feet:  Neurological: She is alert and oriented to person, place, and time.  Psychiatric: She has a normal mood and affect.  Nursing note and vitals reviewed.  BP 125/68 (BP Location: Left Arm, Cuff Size: Large)   Pulse 61   Temp 97.7 F (36.5 C) (Oral)   Resp 16   SpO2 96%  Wt Readings from Last 3 Encounters:  10/05/17 184 lb 8.4 oz (83.7 kg)  05/23/17 195 lb (88.5 kg)  04/28/17 197 lb (89.4 kg)     Lab Results  Component Value Date   WBC 4.3 12/08/2017   HGB 11.9 (L) 12/08/2017   HCT 35.3 (L) 12/08/2017   PLT 209.0 12/08/2017   GLUCOSE 64 (L) 02/25/2018   CHOL 147 10/06/2017   TRIG 64 10/06/2017   HDL 58 10/06/2017   LDLCALC 76 10/06/2017   ALT 11 (L) 10/15/2017   AST 17 10/15/2017   NA 139 02/25/2018   K 3.9 02/25/2018   CL 105 02/25/2018   CREATININE 0.73 02/25/2018   BUN 21 02/25/2018   CO2 30 02/25/2018   TSH 1.16 01/12/2018   INR 1.9 (H) 03/03/2018   HGBA1C 5.0 10/06/2017   MICROALBUR 1.0 01/26/2016    Ct Angio Head W Or Wo Contrast  Result Date: 10/05/2017 CLINICAL DATA:  Slurred speech.  Prior stroke. EXAM: CT ANGIOGRAPHY HEAD AND NECK CT PERFUSION BRAIN TECHNIQUE: Multidetector CT imaging of the head and neck was performed using the standard protocol during bolus administration of intravenous contrast. Multiplanar CT image reconstructions and MIPs were obtained to evaluate the vascular anatomy. Carotid stenosis measurements (when applicable) are obtained utilizing NASCET criteria, using the distal internal carotid diameter as the denominator. Multiphase CT imaging of the brain was performed following IV bolus contrast injection. Subsequent parametric perfusion maps were calculated using RAPID software. CONTRAST:  90mL ISOVUE-370 IOPAMIDOL (ISOVUE-370) INJECTION 76% COMPARISON:  None.  FINDINGS: CTA NECK FINDINGS Aortic arch: Normal variant aortic arch branching pattern with common origin of brachiocephalic and left common carotid arteries. Mild arch atherosclerosis. Widely patent arch vessel origins. Right carotid system: Patent with mild calcified and soft plaque at the carotid bifurcation. No significant stenosis or evidence of dissection. Left carotid system: Patent with minimal calcified plaque at the carotid bifurcation. No stenosis or evidence of dissection. Vertebral arteries: Patent and codominant without evidence of stenosis or dissection. Skeleton: Remote, solid interbody osseous fusion at C4-5. Bilateral C4-5 facet ankylosis. Multilevel disc degeneration, advanced at C5-6 and C6-7. Bulky posterior spurring/disc osteophyte complex at C5-6 results in moderate spinal stenosis. Other neck: No mass or enlarged lymph nodes. Upper chest: Clear lung apices. Review of the MIP images confirms the above findings CTA HEAD FINDINGS Anterior circulation: The internal carotid arteries are patent from skull base to carotid termini with mild nonstenotic siphon  atherosclerosis bilaterally. ACAs and MCAs are patent without evidence of proximal branch occlusion or significant proximal stenosis. No aneurysm or vascular malformation. Posterior circulation: The intracranial vertebral arteries are widely patent to the basilar. PICAs and SCAs are unremarkable. AICAs are small and not well evaluated. The basilar artery is widely patent. There is a small right posterior communicating artery. PCAs are patent without evidence of significant stenosis. No aneurysm or vascular malformation. Venous sinuses: As permitted by contrast timing, patent. Anatomic variants: None. Delayed phase: Not performed. 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 IMPRESSION: 1. No large vessel occlusion. 2. Mild intracranial and cervical atherosclerosis without significant stenosis or aneurysm. 3. No evidence of acute ischemia on perfusion imaging. 4. Increased cortical blood flow throughout the posterior left cerebral hemisphere, query seizure activity in the setting of an old left parietal cortical infarct. These results were called by telephone at the time of interpretation on 10/05/2017 at 2:43 pm to Dr. Ritta Slot , who verbally acknowledged these results. Electronically Signed   By: Sebastian Ache M.D.   On: 10/05/2017 14:58   Ct Angio Neck W Or Wo Contrast  Result Date: 10/05/2017 CLINICAL DATA:  Slurred speech.  Prior stroke. EXAM: CT ANGIOGRAPHY HEAD AND NECK CT PERFUSION BRAIN TECHNIQUE: Multidetector CT imaging of the head and neck was performed using the standard protocol during bolus administration of intravenous contrast. Multiplanar CT image reconstructions and MIPs were obtained to evaluate the vascular anatomy. Carotid stenosis measurements (when applicable) are obtained utilizing NASCET criteria, using the distal internal carotid diameter as the denominator. Multiphase CT imaging of the brain was performed following IV bolus contrast injection. Subsequent parametric perfusion maps were calculated using RAPID software. CONTRAST:  90mL ISOVUE-370 IOPAMIDOL (ISOVUE-370) INJECTION 76% COMPARISON:  None. FINDINGS: CTA NECK FINDINGS Aortic arch: Normal variant aortic arch branching pattern with common origin of brachiocephalic and left common carotid arteries. Mild arch atherosclerosis. Widely patent arch vessel origins. Right carotid system: Patent with mild calcified and soft plaque at the carotid bifurcation. No significant stenosis or evidence of dissection. Left  carotid system: Patent with minimal calcified plaque at the carotid bifurcation. No stenosis or evidence of dissection. Vertebral arteries: Patent and codominant without evidence of stenosis or dissection. Skeleton: Remote, solid interbody osseous fusion at C4-5. Bilateral C4-5 facet ankylosis. Multilevel disc degeneration, advanced at C5-6 and C6-7. Bulky posterior spurring/disc osteophyte complex at C5-6 results in moderate spinal stenosis. Other neck: No mass or enlarged lymph nodes. Upper chest: Clear lung apices. Review of the MIP images confirms the above findings CTA HEAD FINDINGS Anterior circulation: The internal carotid arteries are patent from skull base to carotid termini with mild nonstenotic siphon atherosclerosis bilaterally. ACAs and MCAs are patent without evidence of proximal branch occlusion or significant proximal stenosis. No aneurysm or vascular malformation. Posterior circulation: The intracranial vertebral arteries are widely patent to the basilar. PICAs and SCAs are unremarkable. AICAs are small and not well evaluated. The basilar artery is widely patent. There is a small right posterior communicating artery. PCAs are patent without evidence of significant stenosis. No aneurysm or vascular malformation. Venous sinuses: As permitted by contrast timing, patent. Anatomic variants: None. Delayed phase: Not performed.  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 IMPRESSION: 1. No large vessel occlusion. 2. Mild intracranial and cervical atherosclerosis without significant stenosis or aneurysm. 3. No evidence of acute ischemia on perfusion  imaging. 4. Increased cortical blood flow throughout the posterior left cerebral hemisphere, query seizure activity in the setting of an old left parietal cortical infarct. These results were called by telephone at the time of interpretation on 10/05/2017 at 2:43 pm to Dr. Ritta Slot , who verbally acknowledged these results. Electronically Signed   By: Sebastian Ache M.D.   On: 10/05/2017 14:58   Ct Cerebral Perfusion W Contrast  Result Date: 10/05/2017 CLINICAL DATA:  Slurred speech.  Prior stroke. EXAM: CT ANGIOGRAPHY HEAD AND NECK CT PERFUSION BRAIN TECHNIQUE: Multidetector CT imaging of the head and neck was performed using the standard protocol during bolus administration of intravenous contrast. Multiplanar CT image reconstructions and MIPs were obtained to evaluate the vascular anatomy. Carotid stenosis measurements (when applicable) are obtained utilizing NASCET criteria, using the distal internal carotid diameter as the denominator. Multiphase CT imaging of the brain was performed following IV bolus contrast injection. Subsequent parametric perfusion maps were calculated using RAPID software. CONTRAST:  90mL ISOVUE-370 IOPAMIDOL (ISOVUE-370) INJECTION 76% COMPARISON:  None. FINDINGS: CTA NECK FINDINGS Aortic arch: Normal variant aortic arch branching pattern with common origin of brachiocephalic and left common carotid arteries. Mild arch atherosclerosis. Widely patent arch vessel origins. Right carotid system: Patent with mild calcified and soft plaque at the carotid bifurcation. No significant stenosis or evidence of dissection. Left carotid system: Patent with minimal calcified plaque at the carotid bifurcation. No stenosis or evidence of dissection. Vertebral arteries: Patent and codominant without evidence of stenosis or dissection. Skeleton: Remote, solid interbody osseous fusion at C4-5. Bilateral C4-5 facet ankylosis. Multilevel disc degeneration, advanced at C5-6 and C6-7. Bulky  posterior spurring/disc osteophyte complex at C5-6 results in moderate spinal stenosis. Other neck: No mass or enlarged lymph nodes. Upper chest: Clear lung apices. Review of the MIP images confirms the above findings CTA HEAD FINDINGS Anterior circulation: The internal carotid arteries are patent from skull base to carotid termini with mild nonstenotic siphon atherosclerosis bilaterally. ACAs and MCAs are patent without evidence of proximal branch occlusion or significant proximal stenosis. No aneurysm or vascular malformation. Posterior circulation: The intracranial vertebral arteries are widely patent to the basilar. PICAs and SCAs are unremarkable. AICAs are small and not well evaluated. The basilar artery is widely patent. There is a small right posterior communicating artery. PCAs are patent without evidence of significant stenosis. No aneurysm or vascular malformation. Venous sinuses: As permitted by contrast timing, patent. Anatomic variants: None. Delayed phase: Not performed. 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 IMPRESSION: 1. No large vessel occlusion. 2. Mild intracranial and cervical atherosclerosis without significant stenosis or aneurysm. 3. No evidence of acute ischemia on perfusion imaging. 4. Increased cortical  blood flow throughout the posterior left cerebral hemisphere, query seizure activity in the setting of an old left parietal cortical infarct. These results were called by telephone at the time of interpretation on 10/05/2017 at 2:43 pm to Dr. Ritta Slot , who verbally acknowledged these results. Electronically Signed   By:  Sebastian Ache M.D.   On: 10/05/2017 14:58   Ct Head Code Stroke Wo Contrast  Result Date: 10/05/2017 CLINICAL DATA:  Code stroke. Slurred speech. Prior stroke with residual right-sided deficits. EXAM: CT HEAD WITHOUT CONTRAST TECHNIQUE: Contiguous axial images were obtained from the base of the skull through the vertex without intravenous contrast. COMPARISON:  01/31/2005 FINDINGS: Brain: There is no evidence of acute infarct, intracranial hemorrhage, mass, midline shift, or extra-axial fluid collection. There is a chronic left frontoparietal cortical and subcortical infarct near the vertex which is new from the prior study. Mild lateral and third ventriculomegaly is new and felt to reflect central predominant cerebral atrophy rather than hydrocephalus. Periventricular white matter hypodensities are nonspecific but compatible with mild chronic small vessel ischemic disease. A chronic subcentimeter infarct in the left cerebellum is new. Vascular: Calcified atherosclerosis at the skull base. No hyperdense vessel. Skull: No fracture focal osseous lesion. Sinuses/Orbits: Tiny left maxillary sinus mucous retention cyst. Clear mastoid air cells. Unremarkable orbits. Other: None. ASPECTS [facility] Stroke Program Early CT Score) - Ganglionic level infarction (caudate, lentiform nuclei, internal capsule, insula, M1-M3 cortex): 7 - Supraganglionic infarction (M4-M6 cortex): 3 Total score (0-10 with 10 being normal): 10 IMPRESSION: 1. No evidence of acute intracranial abnormality. 2. ASPECTS is 10. 3. 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 4. Mild chronic small vessel ischemic disease and cerebral atrophy. These results were called by telephone at the time of interpretation on 10/05/2017 at 12:49 pm to Dr. Gwyneth Sprout , who verbally acknowledged these results. Electronically Signed   By: Sebastian Ache M.D.   On: 10/05/2017 12:50     Assessment & Plan:  Plan  I am having Kathleen Jensen  start on valACYclovir and tiZANidine. I am also having her maintain her acetaminophen, calcium carbonate, mirtazapine, polyethylene glycol, senna-docusate, magnesium oxide, warfarin, levETIRAcetam, levothyroxine, losartan, pantoprazole, gabapentin, phenytoin, and hydrochlorothiazide.  Meds ordered this encounter  Medications  . valACYclovir (VALTREX) 1000 MG tablet    Sig: Take 1 tablet (1,000 mg total) by mouth 3 (three) times daily.    Dispense:  21 tablet    Refill:  0  . tiZANidine (ZANAFLEX) 4 MG tablet    Sig: Take 1 tablet (4 mg total) by mouth every 6 (six) hours as needed for muscle spasms.    Dispense:  30 tablet    Refill:  1    Problem List Items Addressed This Visit      Unprioritized   Pulmonary embolism and infarction (HCC)   Relevant Orders   INR/PT (Completed)    Other Visit Diagnoses    Right calf pain    -  Primary   Relevant Orders   US Venous Img Lower Unilateral Right (Completed)   Right hip pain       Relevant Orders   DG HIP UNILAT WITH PELVIS 2-3 VIEWS RIGHT (Completed)   Uric acid (Completed)   Acute midline low back pain with right-sided sciatica       Relevant Medications   tiZANidine (ZANAFLEX) 4 MG tablet   Other Relevant Orders   DG HIP UNILAT WITH PELVIS 2-3 VIEWS RIGHT (Completed)   Uric acid (Completed)  Herpes zoster without complication       Relevant Medications   valACYclovir (VALTREX) 1000 MG tablet      Follow-up: Return if symptoms worsen or fail to improve.  Donato SchultzYvonne R Lowne Chase, DO

## 2018-03-03 NOTE — Telephone Encounter (Signed)
Received Physician Orders from [facility]; forwarded to provider/SLS 08/06

## 2018-03-03 NOTE — Patient Instructions (Signed)

## 2018-03-04 DIAGNOSIS — E1122 Type 2 diabetes mellitus with diabetic chronic kidney disease: Secondary | ICD-10-CM | POA: Diagnosis not present

## 2018-03-04 DIAGNOSIS — I129 Hypertensive chronic kidney disease with stage 1 through stage 4 chronic kidney disease, or unspecified chronic kidney disease: Secondary | ICD-10-CM | POA: Diagnosis not present

## 2018-03-04 DIAGNOSIS — I69351 Hemiplegia and hemiparesis following cerebral infarction affecting right dominant side: Secondary | ICD-10-CM | POA: Diagnosis not present

## 2018-03-04 DIAGNOSIS — M47816 Spondylosis without myelopathy or radiculopathy, lumbar region: Secondary | ICD-10-CM | POA: Diagnosis not present

## 2018-03-04 DIAGNOSIS — G40909 Epilepsy, unspecified, not intractable, without status epilepticus: Secondary | ICD-10-CM | POA: Diagnosis not present

## 2018-03-04 DIAGNOSIS — I69320 Aphasia following cerebral infarction: Secondary | ICD-10-CM | POA: Diagnosis not present

## 2018-03-04 DIAGNOSIS — N183 Chronic kidney disease, stage 3 (moderate): Secondary | ICD-10-CM | POA: Diagnosis not present

## 2018-03-04 DIAGNOSIS — I69398 Other sequelae of cerebral infarction: Secondary | ICD-10-CM | POA: Diagnosis not present

## 2018-03-04 DIAGNOSIS — J45909 Unspecified asthma, uncomplicated: Secondary | ICD-10-CM | POA: Diagnosis not present

## 2018-03-04 LAB — URIC ACID: Uric Acid, Serum: 4.6 mg/dL (ref 2.4–7.0)

## 2018-03-05 ENCOUNTER — Telehealth: Payer: Self-pay | Admitting: Family Medicine

## 2018-03-05 DIAGNOSIS — I69351 Hemiplegia and hemiparesis following cerebral infarction affecting right dominant side: Secondary | ICD-10-CM | POA: Diagnosis not present

## 2018-03-05 DIAGNOSIS — M4800 Spinal stenosis, site unspecified: Secondary | ICD-10-CM | POA: Diagnosis not present

## 2018-03-05 DIAGNOSIS — I129 Hypertensive chronic kidney disease with stage 1 through stage 4 chronic kidney disease, or unspecified chronic kidney disease: Secondary | ICD-10-CM | POA: Diagnosis not present

## 2018-03-05 DIAGNOSIS — I69320 Aphasia following cerebral infarction: Secondary | ICD-10-CM | POA: Diagnosis not present

## 2018-03-05 DIAGNOSIS — N183 Chronic kidney disease, stage 3 (moderate): Secondary | ICD-10-CM | POA: Diagnosis not present

## 2018-03-05 DIAGNOSIS — M47816 Spondylosis without myelopathy or radiculopathy, lumbar region: Secondary | ICD-10-CM | POA: Diagnosis not present

## 2018-03-05 DIAGNOSIS — G40909 Epilepsy, unspecified, not intractable, without status epilepticus: Secondary | ICD-10-CM | POA: Diagnosis not present

## 2018-03-05 DIAGNOSIS — E1122 Type 2 diabetes mellitus with diabetic chronic kidney disease: Secondary | ICD-10-CM | POA: Diagnosis not present

## 2018-03-05 DIAGNOSIS — R569 Unspecified convulsions: Secondary | ICD-10-CM | POA: Diagnosis not present

## 2018-03-05 DIAGNOSIS — J45909 Unspecified asthma, uncomplicated: Secondary | ICD-10-CM | POA: Diagnosis not present

## 2018-03-05 DIAGNOSIS — Z950 Presence of cardiac pacemaker: Secondary | ICD-10-CM | POA: Diagnosis not present

## 2018-03-05 DIAGNOSIS — I69398 Other sequelae of cerebral infarction: Secondary | ICD-10-CM | POA: Diagnosis not present

## 2018-03-05 NOTE — Progress Notes (Signed)
Results given to patient's daughter. She will call the coumadin clinic to pass on the information and recommendations..Marland Kitchen

## 2018-03-05 NOTE — Telephone Encounter (Signed)
Called patient's daughter with information of xray, ultra sound and labs. Patient was also listening to results. She will notify coumadin clinic of elevated INR and Dr's recommendations.

## 2018-03-05 NOTE — Telephone Encounter (Signed)
Copied from CRM [id number]. Topic: Quick Communication - Other Results >> Mar 05, 2018  9:11 AM Stephannie LiSimmons, Roxane Puerto L, NT wrote: Patients daughter called asking for results from xray's and the ultrasound  as well lab work done on 03/03/18 please [phone] or [phone]

## 2018-03-06 ENCOUNTER — Telehealth: Payer: Self-pay | Admitting: *Deleted

## 2018-03-06 ENCOUNTER — Ambulatory Visit: Payer: Medicare HMO | Admitting: Psychology

## 2018-03-06 ENCOUNTER — Telehealth: Payer: Self-pay | Admitting: Family Medicine

## 2018-03-06 DIAGNOSIS — F4323 Adjustment disorder with mixed anxiety and depressed mood: Secondary | ICD-10-CM

## 2018-03-06 NOTE — Telephone Encounter (Signed)
Mo notified of results and given verbal order

## 2018-03-06 NOTE — Telephone Encounter (Signed)
Gave results to home health and she will call the coumadin clinic with the results.  Please give results to daughter charisse.  I called and left message with lady for her to call back.

## 2018-03-06 NOTE — Telephone Encounter (Unsigned)
Copied from CRM [id number]. Topic: Quick Communication - See Telephone Encounter >> Mar 06, 2018 11:37 AM Raquel SarnaHayes, Teresa G wrote: Mo w/ [facility] - [phone]  Needing: Labs PT/INR results  Verbal orders for skilled nursing: 1 time a week for 3 weeks

## 2018-03-06 NOTE — Telephone Encounter (Signed)
Notes recorded by Sharlene DoryWendling, Nicholas Paul, DO on 03/04/2018 at 5:05 PM EDT Noted. Subtherapeutic, though improving.  SR- please let pt know it does not appear that she has gout. Also, if she is being managed by us, I would have her increase dosing to 10 mg twice weekly and 5 mg every other day unless she is being managed by another team. TY.

## 2018-03-06 NOTE — Telephone Encounter (Signed)
Results given to daughter per Thelma BargeSheketia Richardson CMA

## 2018-03-09 DIAGNOSIS — G40909 Epilepsy, unspecified, not intractable, without status epilepticus: Secondary | ICD-10-CM | POA: Diagnosis not present

## 2018-03-09 DIAGNOSIS — I69351 Hemiplegia and hemiparesis following cerebral infarction affecting right dominant side: Secondary | ICD-10-CM | POA: Diagnosis not present

## 2018-03-09 DIAGNOSIS — I129 Hypertensive chronic kidney disease with stage 1 through stage 4 chronic kidney disease, or unspecified chronic kidney disease: Secondary | ICD-10-CM | POA: Diagnosis not present

## 2018-03-09 DIAGNOSIS — I69320 Aphasia following cerebral infarction: Secondary | ICD-10-CM | POA: Diagnosis not present

## 2018-03-09 DIAGNOSIS — J45909 Unspecified asthma, uncomplicated: Secondary | ICD-10-CM | POA: Diagnosis not present

## 2018-03-09 DIAGNOSIS — N183 Chronic kidney disease, stage 3 (moderate): Secondary | ICD-10-CM | POA: Diagnosis not present

## 2018-03-09 DIAGNOSIS — E1122 Type 2 diabetes mellitus with diabetic chronic kidney disease: Secondary | ICD-10-CM | POA: Diagnosis not present

## 2018-03-09 DIAGNOSIS — M47816 Spondylosis without myelopathy or radiculopathy, lumbar region: Secondary | ICD-10-CM | POA: Diagnosis not present

## 2018-03-09 DIAGNOSIS — I69398 Other sequelae of cerebral infarction: Secondary | ICD-10-CM | POA: Diagnosis not present

## 2018-03-11 DIAGNOSIS — N183 Chronic kidney disease, stage 3 (moderate): Secondary | ICD-10-CM | POA: Diagnosis not present

## 2018-03-11 DIAGNOSIS — M47816 Spondylosis without myelopathy or radiculopathy, lumbar region: Secondary | ICD-10-CM | POA: Diagnosis not present

## 2018-03-11 DIAGNOSIS — J45909 Unspecified asthma, uncomplicated: Secondary | ICD-10-CM | POA: Diagnosis not present

## 2018-03-11 DIAGNOSIS — I129 Hypertensive chronic kidney disease with stage 1 through stage 4 chronic kidney disease, or unspecified chronic kidney disease: Secondary | ICD-10-CM | POA: Diagnosis not present

## 2018-03-11 DIAGNOSIS — I69320 Aphasia following cerebral infarction: Secondary | ICD-10-CM | POA: Diagnosis not present

## 2018-03-11 DIAGNOSIS — E1122 Type 2 diabetes mellitus with diabetic chronic kidney disease: Secondary | ICD-10-CM | POA: Diagnosis not present

## 2018-03-11 DIAGNOSIS — I69351 Hemiplegia and hemiparesis following cerebral infarction affecting right dominant side: Secondary | ICD-10-CM | POA: Diagnosis not present

## 2018-03-11 DIAGNOSIS — G40909 Epilepsy, unspecified, not intractable, without status epilepticus: Secondary | ICD-10-CM | POA: Diagnosis not present

## 2018-03-11 DIAGNOSIS — I69398 Other sequelae of cerebral infarction: Secondary | ICD-10-CM | POA: Diagnosis not present

## 2018-03-12 ENCOUNTER — Telehealth: Payer: Self-pay | Admitting: *Deleted

## 2018-03-12 DIAGNOSIS — Z7901 Long term (current) use of anticoagulants: Secondary | ICD-10-CM | POA: Diagnosis not present

## 2018-03-12 DIAGNOSIS — Z5181 Encounter for therapeutic drug level monitoring: Secondary | ICD-10-CM | POA: Diagnosis not present

## 2018-03-12 DIAGNOSIS — I2699 Other pulmonary embolism without acute cor pulmonale: Secondary | ICD-10-CM | POA: Diagnosis not present

## 2018-03-12 NOTE — Telephone Encounter (Signed)
Received Case Communication Report from [facility]; forwarded to provider/SLS 08/15

## 2018-03-13 ENCOUNTER — Encounter: Payer: Self-pay | Admitting: Family Medicine

## 2018-03-13 ENCOUNTER — Ambulatory Visit (INDEPENDENT_AMBULATORY_CARE_PROVIDER_SITE_OTHER): Payer: Medicare HMO | Admitting: Family Medicine

## 2018-03-13 DIAGNOSIS — I2699 Other pulmonary embolism without acute cor pulmonale: Secondary | ICD-10-CM | POA: Diagnosis not present

## 2018-03-13 DIAGNOSIS — J4520 Mild intermittent asthma, uncomplicated: Secondary | ICD-10-CM

## 2018-03-13 DIAGNOSIS — E785 Hyperlipidemia, unspecified: Secondary | ICD-10-CM | POA: Diagnosis not present

## 2018-03-13 DIAGNOSIS — I10 Essential (primary) hypertension: Secondary | ICD-10-CM

## 2018-03-13 DIAGNOSIS — I639 Cerebral infarction, unspecified: Secondary | ICD-10-CM | POA: Diagnosis not present

## 2018-03-13 DIAGNOSIS — R4701 Aphasia: Secondary | ICD-10-CM

## 2018-03-13 DIAGNOSIS — J45909 Unspecified asthma, uncomplicated: Secondary | ICD-10-CM | POA: Insufficient documentation

## 2018-03-13 MED ORDER — ALBUTEROL SULFATE HFA 108 (90 BASE) MCG/ACT IN AERS: 2.0000 | INHALATION_SPRAY | Freq: Four times a day (QID) | RESPIRATORY_TRACT | 0 refills | Status: DC | PRN

## 2018-03-13 MED FILL — VENTOLIN HFA 90 MCG INHALER: 108 (90 BAS | 25 days supply | Qty: 18 | Fill #0

## 2018-03-13 NOTE — Progress Notes (Signed)
[name], female    DOB: [birth year]  Age: 67 y.o. MRN: [id number]    Subjective:  Subjective  HPI Kathleen LawmanRovena W Thomason presents for with daughter for f/u and to have a form filled out.  No new complaints.   Review of Systems  Constitutional: Negative for appetite change, diaphoresis, fatigue and unexpected weight change.  Eyes: Negative for pain, redness and visual disturbance.  Respiratory: Negative for cough, chest tightness, shortness of breath and wheezing.   Cardiovascular: Negative for chest pain, palpitations and leg swelling.  Endocrine: Negative for cold intolerance, heat intolerance, polydipsia, polyphagia and polyuria.  Genitourinary: Negative for difficulty urinating, dysuria and frequency.  Musculoskeletal: Positive for gait problem.  Neurological: Positive for weakness. Negative for dizziness, light-headedness, numbness and headaches.    History Past Medical History:  Diagnosis Date  . Arthritis   . 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 Hypertension     She has a past surgical history that includes Tonsillectomy; Gastric bypass (03/2007); Cervical spine surgery; Cesarean section; Breast biopsy (Right); Breast excisional biopsy; and Back surgery.   Her family history includes Cancer in her brother and sister; Diabetes in her father and mother; High blood pressure in her mother; Stroke in her father.She reports that she quit smoking about 38 years ago. She quit after 4.00 years of use. She has never used smokeless tobacco. She reports that she does not drink alcohol or use drugs.  Current Outpatient Medications on File Prior to Visit  Medication Sig Dispense Refill  . acetaminophen (TYLENOL) 325 MG tablet Take 1 tablet by mouth every 8 (eight) hours as needed.    . calcium carbonate (OS-CAL - DOSED IN MG OF ELEMENTAL CALCIUM) 1250 (500 Ca) MG tablet 1 tablet daily.    Marland Kitchen. gabapentin (NEURONTIN) 300 MG capsule Take  1 capsule by mouth 3 (three) times daily.    . hydrochlorothiazide (MICROZIDE) 12.5 MG capsule Take 1 capsule (12.5 mg total) by mouth daily. 90 capsule 0  . levETIRAcetam (KEPPRA) 1000 MG tablet 2 po bid 120 tablet 2  . levothyroxine (SYNTHROID, LEVOTHROID) 88 MCG tablet Take 1 tablet (88 mcg total) by mouth daily before breakfast. 90 tablet 3  . losartan (COZAAR) 100 MG tablet Take 1 tablet (100 mg total) by mouth daily. 90 tablet 3  . magnesium oxide (MAG-OX) 400 MG tablet Take 400 mg by mouth 2 (two) times daily.    . mirtazapine (REMERON) 15 MG tablet Take 1 tablet by mouth at bedtime.    . pantoprazole (PROTONIX) 40 MG tablet Take 1 tablet (40 mg total) by mouth daily. 90 tablet 3  . phenytoin (DILANTIN) 50 MG tablet 200mg  in the morning and 200mg  in the evening    . polyethylene glycol (MIRALAX / GLYCOLAX) packet Take 17 g by mouth daily as needed.     . senna-docusate (SENOKOT-S) 8.6-50 MG tablet Take 1 tablet by mouth 2 (two) times daily.    Marland Kitchen. tiZANidine (ZANAFLEX) 4 MG tablet Take 1 tablet (4 mg total) by mouth every 6 (six) hours as needed for muscle spasms. 30 tablet 1  . valACYclovir (VALTREX) 1000 MG tablet Take 1 tablet (1,000 mg total) by mouth 3 (three) times daily. 21 tablet 0  . warfarin (COUMADIN) 5 MG tablet 10 mg on mon and 5 mg all other days (Patient taking differently: TAKE AS DIRECTED BY COUMADIN CLINIC) 45 tablet 3   No current facility-administered medications on file prior to  visit.      Objective:  Objective  Physical Exam  Constitutional: She is oriented to person, place, and time. She appears well-developed and well-nourished.  HENT:  Head: Normocephalic and atraumatic.  Eyes: Conjunctivae and EOM are normal.  Neck: Normal range of motion. Neck supple. No JVD present. Carotid bruit is not present. No thyromegaly present.  Cardiovascular: Normal rate, regular rhythm and normal heart sounds.  No murmur heard. Pulmonary/Chest: Effort normal and breath sounds  normal. No respiratory distress. She has no wheezes. She has no rales. She exhibits no tenderness.  Musculoskeletal: She exhibits no edema.  Neurological: She is alert and oriented to person, place, and time.  Psychiatric: She has a normal mood and affect.  Nursing note and vitals reviewed.  BP 127/66 (BP Location: Left Arm, Cuff Size: Large)   Pulse 60   Temp 98.4 F (36.9 C) (Oral)   Resp 16   Wt 216 lb 6.4 oz (98.2 kg) Comment: 236.4lb with wheelchair.  wheelchair is appr. 20lbs  SpO2 98%   BMI 36.95 kg/m  Wt Readings from Last 3 Encounters:  03/13/18 216 lb 6.4 oz (98.2 kg)  10/05/17 184 lb 8.4 oz (83.7 kg)  05/23/17 195 lb (88.5 kg)     Lab Results  Component Value Date   WBC 4.3 12/08/2017   HGB 11.9 (L) 12/08/2017   HCT 35.3 (L) 12/08/2017   PLT 209.0 12/08/2017   GLUCOSE 64 (L) 02/25/2018   CHOL 147 10/06/2017   TRIG 64 10/06/2017   HDL 58 10/06/2017   LDLCALC 76 10/06/2017   ALT 11 (L) 10/15/2017   AST 17 10/15/2017   NA 139 02/25/2018   K 3.9 02/25/2018   CL 105 02/25/2018   CREATININE 0.73 02/25/2018   BUN 21 02/25/2018   CO2 30 02/25/2018   TSH 1.16 01/12/2018   INR 1.9 (H) 03/03/2018   HGBA1C 5.0 10/06/2017   MICROALBUR 1.0 01/26/2016    [place] Venous Img Lower Unilateral Right  Result Date: 03/03/2018 CLINICAL DATA:  Right calf pain swelling. History pulmonary embolism, currently on anticoagulation. Evaluate for DVT. EXAM: RIGHT LOWER EXTREMITY VENOUS DOPPLER ULTRASOUND TECHNIQUE: Gray-scale sonography with graded compression, as well as color Doppler and duplex ultrasound were performed to evaluate the lower extremity deep venous systems from the level of the common femoral vein and including the common femoral, femoral, profunda femoral, popliteal and calf veins including the posterior tibial, peroneal and gastrocnemius veins when visible. The superficial great saphenous vein was also interrogated. Spectral Doppler was utilized to evaluate flow at rest and  with distal augmentation maneuvers in the common femoral, femoral and popliteal veins. COMPARISON:  None. FINDINGS: Contralateral Common Femoral Vein: Respiratory phasicity is normal and symmetric with the symptomatic side. No evidence of thrombus. Normal compressibility. Common Femoral Vein: No evidence of thrombus. Normal compressibility, respiratory phasicity and response to augmentation. Saphenofemoral Junction: No evidence of thrombus. Normal compressibility and flow on color Doppler imaging. Profunda Femoral Vein: No evidence of thrombus. Normal compressibility and flow on color Doppler imaging. Femoral Vein: No evidence of thrombus. Normal compressibility, respiratory phasicity and response to augmentation. Popliteal Vein: No evidence of thrombus. Normal compressibility, respiratory phasicity and response to augmentation. Calf Veins: No evidence of thrombus. Normal compressibility and flow on color Doppler imaging. Superficial Great Saphenous Vein: No evidence of thrombus. Normal compressibility. Venous Reflux:  None. Other Findings: Note is made of a approximately 2.9 x 0.9 x 1.9 cm minimally complex fluid collection with the right popliteal fossa compatible  with a Baker's cyst. Moderate amount of subcutaneous edema is noted at the level of the right ankle. IMPRESSION: No evidence of DVT within the right lower extremity. Electronically Signed   By: Simonne Come M.D.   On: 03/03/2018 16:43   Dg Hip Unilat With Pelvis 2-3 Views Right  Result Date: 03/03/2018 CLINICAL DATA:  Right thigh pain since yesterday. EXAM: DG HIP (WITH OR WITHOUT PELVIS) 2-3V RIGHT COMPARISON:  None. FINDINGS: Examination is degraded due to patient body habitus. No fracture or dislocation.  Right hip joint spaces appear preserved Limited visualization of the pelvis and contralateral left hip is normal. 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 Large colonic stool burden.  Regional soft tissues appear normal.  IMPRESSION: No definite explanation for patient's right thigh pain. Electronically Signed   By: Simonne Come M.D.   On: 03/03/2018 16:46     Assessment & Plan:  Plan  I am having Kathleen Lawman start on albuterol. I am also having her maintain her acetaminophen, calcium carbonate, mirtazapine, polyethylene glycol, senna-docusate, magnesium oxide, warfarin, levETIRAcetam, levothyroxine, losartan, pantoprazole, gabapentin, phenytoin, hydrochlorothiazide, valACYclovir, and tiZANidine.  Meds ordered this encounter  Medications  . albuterol (PROVENTIL HFA;VENTOLIN HFA) 108 (90 Base) MCG/ACT inhaler    Sig: Inhale 2 puffs into the lungs every 6 (six) hours as needed for wheezing or shortness of breath.    Dispense:  1 Inhaler    Refill:  0    Problem List Items Addressed This Visit      Unprioritized   Asthma    Stable Only uses albuterol prn       Relevant Medications   albuterol (PROVENTIL HFA;VENTOLIN HFA) 108 (90 Base) MCG/ACT inhaler   CVA (cerebral vascular accident) (HCC)    Per neuro No new symptoms       Essential hypertension    Well controlled, no changes to meds. Encouraged heart healthy diet such as the DASH diet and exercise as tolerated.       Expressive aphasia    Per neuro stable      Hyperlipidemia LDL goal <70    Tolerating statin, encouraged heart healthy diet, avoid trans fats, minimize simple carbs and saturated fats. Increase exercise as tolerated      Pulmonary embolism and infarction [facility])    Per neuro and coumadin clinic          Follow-up: Return if symptoms worsen or fail to improve.  Donato Schultz, DO

## 2018-03-13 NOTE — Assessment & Plan Note (Signed)
Stable Only uses albuterol prn

## 2018-03-13 NOTE — Patient Instructions (Signed)
Albuterol inhalation aerosol What is this medicine? ALBUTEROL (al BYOO ter ole) is a bronchodilator. It helps open up the airways in your lungs to make it easier to breathe. This medicine is used to treat and to prevent bronchospasm. This medicine may be used for other purposes; ask your health care provider or pharmacist if you have questions. COMMON BRAND NAME(S): Proair HFA, Proventil, Proventil HFA, Respirol, Ventolin, Ventolin HFA What should I tell my health care provider before I take this medicine? They need to know if you have any of the following conditions: -diabetes -heart disease or irregular heartbeat -high blood pressure -pheochromocytoma -seizures -thyroid disease -an unusual or allergic reaction to albuterol, levalbuterol, sulfites, other medicines, foods, dyes, or preservatives -pregnant or trying to get pregnant -breast-feeding How should I use this medicine? This medicine is for inhalation through the mouth. Follow the directions on your prescription label. Take your medicine at regular intervals. Do not use more often than directed. Make sure that you are using your inhaler correctly. Ask you doctor or health care provider if you have any questions. Talk to your pediatrician regarding the use of this medicine in children. Special care may be needed. Overdosage: If you think you have taken too much of this medicine contact a poison control center or emergency room at once. NOTE: This medicine is only for you. Do not share this medicine with others. What if I miss a dose? If you miss a dose, use it as soon as you can. If it is almost time for your next dose, use only that dose. Do not use double or extra doses. What may interact with this medicine? -anti-infectives like chloroquine and pentamidine -caffeine -cisapride -diuretics -medicines for colds -medicines for depression or for emotional or psychotic conditions -medicines for weight loss including some herbal  products -methadone -some antibiotics like clarithromycin, erythromycin, levofloxacin, and linezolid -some heart medicines -steroid hormones like dexamethasone, cortisone, hydrocortisone -theophylline -thyroid hormones This list may not describe all possible interactions. Give your health care provider a list of all the medicines, herbs, non-prescription drugs, or dietary supplements you use. Also tell them if you smoke, drink alcohol, or use illegal drugs. Some items may interact with your medicine. What should I watch for while using this medicine? Tell your doctor or health care professional if your symptoms do not improve. Do not use extra albuterol. If your asthma or bronchitis gets worse while you are using this medicine, call your doctor right away. If your mouth gets dry try chewing sugarless gum or sucking hard candy. Drink water as directed. What side effects may I notice from receiving this medicine? Side effects that you should report to your doctor or health care professional as soon as possible: -allergic reactions like skin rash, itching or hives, swelling of the face, lips, or tongue -breathing problems -chest pain -feeling faint or lightheaded, falls -high blood pressure -irregular heartbeat -fever -muscle cramps or weakness -pain, tingling, numbness in the hands or feet -vomiting Side effects that usually do not require medical attention (report to your doctor or health care professional if they continue or are bothersome): -cough -difficulty sleeping -headache -nervousness or trembling -stomach upset -stuffy or runny nose -throat irritation -unusual taste This list may not describe all possible side effects. Call your doctor for medical advice about side effects. You may report side effects to FDA at 1-800-FDA-1088. Where should I keep my medicine? Keep out of the reach of children. Store at room temperature between 15 and 30 degrees   C (59 and 86 degrees F). The  contents are under pressure and may burst when exposed to heat or flame. Do not freeze. This medicine does not work as well if it is too cold. Throw away any unused medicine after the expiration date. Inhalers need to be thrown away after the labeled number of puffs have been used or by the expiration date; whichever comes first. Ventolin HFA should be thrown away 12 months after removing from foil pouch. Check the instructions that come with your medicine. NOTE: This sheet is a summary. It may not cover all possible information. If you have questions about this medicine, talk to your doctor, pharmacist, or health care provider.  2018 Elsevier/Gold Standard (2012-12-31 10:57:17)  

## 2018-03-13 NOTE — Assessment & Plan Note (Signed)
Per neuro stable 

## 2018-03-13 NOTE — Assessment & Plan Note (Signed)
Per neuro and coumadin clinic

## 2018-03-13 NOTE — Assessment & Plan Note (Signed)
Per neuro No new symptoms

## 2018-03-13 NOTE — Assessment & Plan Note (Signed)
Well controlled, no changes to meds. Encouraged heart healthy diet such as the DASH diet and exercise as tolerated.  °

## 2018-03-13 NOTE — Assessment & Plan Note (Signed)
Tolerating statin, encouraged heart healthy diet, avoid trans fats, minimize simple carbs and saturated fats. Increase exercise as tolerated 

## 2018-03-16 DIAGNOSIS — G40909 Epilepsy, unspecified, not intractable, without status epilepticus: Secondary | ICD-10-CM | POA: Diagnosis not present

## 2018-03-16 DIAGNOSIS — N183 Chronic kidney disease, stage 3 (moderate): Secondary | ICD-10-CM | POA: Diagnosis not present

## 2018-03-16 DIAGNOSIS — M47816 Spondylosis without myelopathy or radiculopathy, lumbar region: Secondary | ICD-10-CM | POA: Diagnosis not present

## 2018-03-16 DIAGNOSIS — E1122 Type 2 diabetes mellitus with diabetic chronic kidney disease: Secondary | ICD-10-CM | POA: Diagnosis not present

## 2018-03-16 DIAGNOSIS — I129 Hypertensive chronic kidney disease with stage 1 through stage 4 chronic kidney disease, or unspecified chronic kidney disease: Secondary | ICD-10-CM | POA: Diagnosis not present

## 2018-03-16 DIAGNOSIS — I69320 Aphasia following cerebral infarction: Secondary | ICD-10-CM | POA: Diagnosis not present

## 2018-03-16 DIAGNOSIS — J45909 Unspecified asthma, uncomplicated: Secondary | ICD-10-CM | POA: Diagnosis not present

## 2018-03-16 DIAGNOSIS — I69351 Hemiplegia and hemiparesis following cerebral infarction affecting right dominant side: Secondary | ICD-10-CM | POA: Diagnosis not present

## 2018-03-16 DIAGNOSIS — I69398 Other sequelae of cerebral infarction: Secondary | ICD-10-CM | POA: Diagnosis not present

## 2018-03-17 ENCOUNTER — Other Ambulatory Visit: Payer: Self-pay | Admitting: Family Medicine

## 2018-03-17 ENCOUNTER — Telehealth: Payer: Self-pay | Admitting: *Deleted

## 2018-03-17 DIAGNOSIS — I69398 Other sequelae of cerebral infarction: Secondary | ICD-10-CM | POA: Diagnosis not present

## 2018-03-17 DIAGNOSIS — Z8673 Personal history of transient ischemic attack (TIA), and cerebral infarction without residual deficits: Secondary | ICD-10-CM

## 2018-03-17 DIAGNOSIS — I69320 Aphasia following cerebral infarction: Secondary | ICD-10-CM | POA: Diagnosis not present

## 2018-03-17 DIAGNOSIS — N183 Chronic kidney disease, stage 3 (moderate): Secondary | ICD-10-CM | POA: Diagnosis not present

## 2018-03-17 DIAGNOSIS — J45909 Unspecified asthma, uncomplicated: Secondary | ICD-10-CM | POA: Diagnosis not present

## 2018-03-17 DIAGNOSIS — I129 Hypertensive chronic kidney disease with stage 1 through stage 4 chronic kidney disease, or unspecified chronic kidney disease: Secondary | ICD-10-CM | POA: Diagnosis not present

## 2018-03-17 DIAGNOSIS — G40909 Epilepsy, unspecified, not intractable, without status epilepticus: Secondary | ICD-10-CM | POA: Diagnosis not present

## 2018-03-17 DIAGNOSIS — I69351 Hemiplegia and hemiparesis following cerebral infarction affecting right dominant side: Secondary | ICD-10-CM | POA: Diagnosis not present

## 2018-03-17 DIAGNOSIS — M47816 Spondylosis without myelopathy or radiculopathy, lumbar region: Secondary | ICD-10-CM | POA: Diagnosis not present

## 2018-03-17 DIAGNOSIS — E1122 Type 2 diabetes mellitus with diabetic chronic kidney disease: Secondary | ICD-10-CM | POA: Diagnosis not present

## 2018-03-17 NOTE — Telephone Encounter (Signed)
Copied from CRM [phone]. Topic: Referral - Request >> Mar 17, 2018 12:55 PM Marylen PontoMcneil, Ja-Kwan wrote: Reason for CRM: Pt daughter [name] requests referral to outpatient physical therapy as the current order with [facility] will be discontinued. Cb# [phone]

## 2018-03-18 DIAGNOSIS — I129 Hypertensive chronic kidney disease with stage 1 through stage 4 chronic kidney disease, or unspecified chronic kidney disease: Secondary | ICD-10-CM | POA: Diagnosis not present

## 2018-03-18 DIAGNOSIS — E1122 Type 2 diabetes mellitus with diabetic chronic kidney disease: Secondary | ICD-10-CM | POA: Diagnosis not present

## 2018-03-18 DIAGNOSIS — M47816 Spondylosis without myelopathy or radiculopathy, lumbar region: Secondary | ICD-10-CM | POA: Diagnosis not present

## 2018-03-18 DIAGNOSIS — N183 Chronic kidney disease, stage 3 (moderate): Secondary | ICD-10-CM | POA: Diagnosis not present

## 2018-03-18 DIAGNOSIS — I69320 Aphasia following cerebral infarction: Secondary | ICD-10-CM | POA: Diagnosis not present

## 2018-03-18 DIAGNOSIS — I69351 Hemiplegia and hemiparesis following cerebral infarction affecting right dominant side: Secondary | ICD-10-CM | POA: Diagnosis not present

## 2018-03-18 DIAGNOSIS — I69398 Other sequelae of cerebral infarction: Secondary | ICD-10-CM | POA: Diagnosis not present

## 2018-03-18 DIAGNOSIS — G40909 Epilepsy, unspecified, not intractable, without status epilepticus: Secondary | ICD-10-CM | POA: Diagnosis not present

## 2018-03-18 DIAGNOSIS — J45909 Unspecified asthma, uncomplicated: Secondary | ICD-10-CM | POA: Diagnosis not present

## 2018-03-18 NOTE — Telephone Encounter (Signed)
She really needs those--- can we just con't pt with [facility]?

## 2018-03-18 NOTE — Telephone Encounter (Signed)
Advised daughter that we put in a referral for PT.  [facility] if patient does out patient PT they will cut out the speech and OT.  She would like your opinion on doing the out patient therapy.  She does not want it to hinder patients improvement if she is getting better.

## 2018-03-19 ENCOUNTER — Telehealth: Payer: Self-pay | Admitting: *Deleted

## 2018-03-19 DIAGNOSIS — E1122 Type 2 diabetes mellitus with diabetic chronic kidney disease: Secondary | ICD-10-CM | POA: Diagnosis not present

## 2018-03-19 DIAGNOSIS — N183 Chronic kidney disease, stage 3 (moderate): Secondary | ICD-10-CM | POA: Diagnosis not present

## 2018-03-19 DIAGNOSIS — I69398 Other sequelae of cerebral infarction: Secondary | ICD-10-CM | POA: Diagnosis not present

## 2018-03-19 DIAGNOSIS — G40909 Epilepsy, unspecified, not intractable, without status epilepticus: Secondary | ICD-10-CM | POA: Diagnosis not present

## 2018-03-19 DIAGNOSIS — M47816 Spondylosis without myelopathy or radiculopathy, lumbar region: Secondary | ICD-10-CM | POA: Diagnosis not present

## 2018-03-19 DIAGNOSIS — I69320 Aphasia following cerebral infarction: Secondary | ICD-10-CM | POA: Diagnosis not present

## 2018-03-19 DIAGNOSIS — I69351 Hemiplegia and hemiparesis following cerebral infarction affecting right dominant side: Secondary | ICD-10-CM | POA: Diagnosis not present

## 2018-03-19 DIAGNOSIS — J45909 Unspecified asthma, uncomplicated: Secondary | ICD-10-CM | POA: Diagnosis not present

## 2018-03-19 DIAGNOSIS — I129 Hypertensive chronic kidney disease with stage 1 through stage 4 chronic kidney disease, or unspecified chronic kidney disease: Secondary | ICD-10-CM | POA: Diagnosis not present

## 2018-03-19 NOTE — Telephone Encounter (Signed)
Received Physician Orders from [facility]; forwarded to provider/SLS 08/22

## 2018-03-20 NOTE — Telephone Encounter (Signed)
Spoke with pt's daughter which states that Home health said they were going to stop PT for a couple of months and then re-evaluate to see if additional PT would be needed. Daughter is concerned about stopping PT and wanted to try getting in an outpt setting but was told home health OT and Speech therapy would be discontinued if pt went to outpt therapy. Daughter also wanted to look into outpt speech and OT but is concerned about cost. Advised her we are [facility] to hear [facility] as PCP would really like these modalities continued through them.   Left message on RN (Mo) [facility] @ [phone] to verify that PCP wants pt to continue home health PT, OT and speech therapy and to call us back to verify that pt is still under their care. [facility] Triage to discuss with pt.

## 2018-03-23 DIAGNOSIS — J45909 Unspecified asthma, uncomplicated: Secondary | ICD-10-CM | POA: Diagnosis not present

## 2018-03-23 DIAGNOSIS — I69398 Other sequelae of cerebral infarction: Secondary | ICD-10-CM | POA: Diagnosis not present

## 2018-03-23 DIAGNOSIS — E1122 Type 2 diabetes mellitus with diabetic chronic kidney disease: Secondary | ICD-10-CM | POA: Diagnosis not present

## 2018-03-23 DIAGNOSIS — G40909 Epilepsy, unspecified, not intractable, without status epilepticus: Secondary | ICD-10-CM | POA: Diagnosis not present

## 2018-03-23 DIAGNOSIS — M47816 Spondylosis without myelopathy or radiculopathy, lumbar region: Secondary | ICD-10-CM | POA: Diagnosis not present

## 2018-03-23 DIAGNOSIS — I69351 Hemiplegia and hemiparesis following cerebral infarction affecting right dominant side: Secondary | ICD-10-CM | POA: Diagnosis not present

## 2018-03-23 DIAGNOSIS — I69320 Aphasia following cerebral infarction: Secondary | ICD-10-CM | POA: Diagnosis not present

## 2018-03-23 DIAGNOSIS — N183 Chronic kidney disease, stage 3 (moderate): Secondary | ICD-10-CM | POA: Diagnosis not present

## 2018-03-23 DIAGNOSIS — I129 Hypertensive chronic kidney disease with stage 1 through stage 4 chronic kidney disease, or unspecified chronic kidney disease: Secondary | ICD-10-CM | POA: Diagnosis not present

## 2018-03-24 DIAGNOSIS — M47816 Spondylosis without myelopathy or radiculopathy, lumbar region: Secondary | ICD-10-CM | POA: Diagnosis not present

## 2018-03-24 DIAGNOSIS — I129 Hypertensive chronic kidney disease with stage 1 through stage 4 chronic kidney disease, or unspecified chronic kidney disease: Secondary | ICD-10-CM | POA: Diagnosis not present

## 2018-03-24 DIAGNOSIS — J45909 Unspecified asthma, uncomplicated: Secondary | ICD-10-CM | POA: Diagnosis not present

## 2018-03-24 DIAGNOSIS — E1122 Type 2 diabetes mellitus with diabetic chronic kidney disease: Secondary | ICD-10-CM | POA: Diagnosis not present

## 2018-03-24 DIAGNOSIS — I69398 Other sequelae of cerebral infarction: Secondary | ICD-10-CM | POA: Diagnosis not present

## 2018-03-24 DIAGNOSIS — I69320 Aphasia following cerebral infarction: Secondary | ICD-10-CM | POA: Diagnosis not present

## 2018-03-24 DIAGNOSIS — I69351 Hemiplegia and hemiparesis following cerebral infarction affecting right dominant side: Secondary | ICD-10-CM | POA: Diagnosis not present

## 2018-03-24 DIAGNOSIS — G40909 Epilepsy, unspecified, not intractable, without status epilepticus: Secondary | ICD-10-CM | POA: Diagnosis not present

## 2018-03-24 DIAGNOSIS — N183 Chronic kidney disease, stage 3 (moderate): Secondary | ICD-10-CM | POA: Diagnosis not present

## 2018-03-25 DIAGNOSIS — J45909 Unspecified asthma, uncomplicated: Secondary | ICD-10-CM | POA: Diagnosis not present

## 2018-03-25 DIAGNOSIS — I69398 Other sequelae of cerebral infarction: Secondary | ICD-10-CM | POA: Diagnosis not present

## 2018-03-25 DIAGNOSIS — I69320 Aphasia following cerebral infarction: Secondary | ICD-10-CM | POA: Diagnosis not present

## 2018-03-25 DIAGNOSIS — I129 Hypertensive chronic kidney disease with stage 1 through stage 4 chronic kidney disease, or unspecified chronic kidney disease: Secondary | ICD-10-CM | POA: Diagnosis not present

## 2018-03-25 DIAGNOSIS — M47816 Spondylosis without myelopathy or radiculopathy, lumbar region: Secondary | ICD-10-CM | POA: Diagnosis not present

## 2018-03-25 DIAGNOSIS — Z7901 Long term (current) use of anticoagulants: Secondary | ICD-10-CM | POA: Diagnosis not present

## 2018-03-25 DIAGNOSIS — I2699 Other pulmonary embolism without acute cor pulmonale: Secondary | ICD-10-CM | POA: Diagnosis not present

## 2018-03-25 DIAGNOSIS — R2241 Localized swelling, mass and lump, right lower limb: Secondary | ICD-10-CM | POA: Diagnosis not present

## 2018-03-25 DIAGNOSIS — G40909 Epilepsy, unspecified, not intractable, without status epilepticus: Secondary | ICD-10-CM | POA: Diagnosis not present

## 2018-03-25 DIAGNOSIS — N183 Chronic kidney disease, stage 3 (moderate): Secondary | ICD-10-CM | POA: Diagnosis not present

## 2018-03-25 DIAGNOSIS — I69351 Hemiplegia and hemiparesis following cerebral infarction affecting right dominant side: Secondary | ICD-10-CM | POA: Diagnosis not present

## 2018-03-25 DIAGNOSIS — I10 Essential (primary) hypertension: Secondary | ICD-10-CM | POA: Diagnosis not present

## 2018-03-25 DIAGNOSIS — E1122 Type 2 diabetes mellitus with diabetic chronic kidney disease: Secondary | ICD-10-CM | POA: Diagnosis not present

## 2018-03-26 ENCOUNTER — Telehealth: Payer: Self-pay | Admitting: Family Medicine

## 2018-03-26 ENCOUNTER — Ambulatory Visit: Payer: Medicare HMO | Admitting: Psychology

## 2018-03-26 DIAGNOSIS — F4321 Adjustment disorder with depressed mood: Secondary | ICD-10-CM

## 2018-03-26 NOTE — Telephone Encounter (Signed)
Copied from CRM [id number]. Topic: Quick Communication - See Telephone Encounter >> Mar 26, 2018 11:58 AM Valentina LucksMatos, Jackelin wrote: CRM for notification. See Telephone encounter for: 03/26/18.   Pt's daughter stated that provider needed to know the weight for her wheelchair, daughter states wheelchair is 36 pounds. Please inform provider. If any question please call daughter at tel number mention in message.

## 2018-03-27 DIAGNOSIS — Z8673 Personal history of transient ischemic attack (TIA), and cerebral infarction without residual deficits: Secondary | ICD-10-CM | POA: Diagnosis not present

## 2018-03-27 DIAGNOSIS — I639 Cerebral infarction, unspecified: Secondary | ICD-10-CM | POA: Diagnosis not present

## 2018-03-27 DIAGNOSIS — R4701 Aphasia: Secondary | ICD-10-CM | POA: Diagnosis not present

## 2018-03-27 DIAGNOSIS — M6281 Muscle weakness (generalized): Secondary | ICD-10-CM | POA: Diagnosis not present

## 2018-04-01 ENCOUNTER — Telehealth: Payer: Self-pay | Admitting: *Deleted

## 2018-04-01 DIAGNOSIS — I2699 Other pulmonary embolism without acute cor pulmonale: Secondary | ICD-10-CM | POA: Diagnosis not present

## 2018-04-01 DIAGNOSIS — Z5181 Encounter for therapeutic drug level monitoring: Secondary | ICD-10-CM | POA: Diagnosis not present

## 2018-04-01 DIAGNOSIS — R791 Abnormal coagulation profile: Secondary | ICD-10-CM | POA: Diagnosis not present

## 2018-04-01 DIAGNOSIS — Z7901 Long term (current) use of anticoagulants: Secondary | ICD-10-CM | POA: Diagnosis not present

## 2018-04-01 NOTE — Telephone Encounter (Signed)
Received Physician Orders/PT POC from [facility]; forwarded to provider/SLS 09/04

## 2018-04-05 DIAGNOSIS — I69398 Other sequelae of cerebral infarction: Secondary | ICD-10-CM | POA: Diagnosis not present

## 2018-04-05 DIAGNOSIS — R569 Unspecified convulsions: Secondary | ICD-10-CM | POA: Diagnosis not present

## 2018-04-05 DIAGNOSIS — M4800 Spinal stenosis, site unspecified: Secondary | ICD-10-CM | POA: Diagnosis not present

## 2018-04-07 DIAGNOSIS — R531 Weakness: Secondary | ICD-10-CM | POA: Diagnosis not present

## 2018-04-07 DIAGNOSIS — R262 Difficulty in walking, not elsewhere classified: Secondary | ICD-10-CM | POA: Diagnosis not present

## 2018-04-07 DIAGNOSIS — I69351 Hemiplegia and hemiparesis following cerebral infarction affecting right dominant side: Secondary | ICD-10-CM | POA: Diagnosis not present

## 2018-04-07 DIAGNOSIS — Z9181 History of falling: Secondary | ICD-10-CM | POA: Diagnosis not present

## 2018-04-08 ENCOUNTER — Telehealth: Payer: Self-pay | Admitting: *Deleted

## 2018-04-08 NOTE — Telephone Encounter (Signed)
[facility] PT Plan Certification; forwarded to provider/SLS 09/11

## 2018-04-09 DIAGNOSIS — R531 Weakness: Secondary | ICD-10-CM | POA: Diagnosis not present

## 2018-04-09 DIAGNOSIS — I69351 Hemiplegia and hemiparesis following cerebral infarction affecting right dominant side: Secondary | ICD-10-CM | POA: Diagnosis not present

## 2018-04-09 DIAGNOSIS — Z9181 History of falling: Secondary | ICD-10-CM | POA: Diagnosis not present

## 2018-04-09 DIAGNOSIS — R262 Difficulty in walking, not elsewhere classified: Secondary | ICD-10-CM | POA: Diagnosis not present

## 2018-04-14 DIAGNOSIS — M4726 Other spondylosis with radiculopathy, lumbar region: Secondary | ICD-10-CM | POA: Diagnosis not present

## 2018-04-14 DIAGNOSIS — M4316 Spondylolisthesis, lumbar region: Secondary | ICD-10-CM | POA: Diagnosis not present

## 2018-04-14 DIAGNOSIS — M5431 Sciatica, right side: Secondary | ICD-10-CM | POA: Diagnosis not present

## 2018-04-14 DIAGNOSIS — M5432 Sciatica, left side: Secondary | ICD-10-CM | POA: Diagnosis not present

## 2018-04-15 DIAGNOSIS — R531 Weakness: Secondary | ICD-10-CM | POA: Diagnosis not present

## 2018-04-15 DIAGNOSIS — R262 Difficulty in walking, not elsewhere classified: Secondary | ICD-10-CM | POA: Diagnosis not present

## 2018-04-15 DIAGNOSIS — I69351 Hemiplegia and hemiparesis following cerebral infarction affecting right dominant side: Secondary | ICD-10-CM | POA: Diagnosis not present

## 2018-04-15 DIAGNOSIS — Z9181 History of falling: Secondary | ICD-10-CM | POA: Diagnosis not present

## 2018-04-16 DIAGNOSIS — Z86711 Personal history of pulmonary embolism: Secondary | ICD-10-CM | POA: Diagnosis not present

## 2018-04-16 DIAGNOSIS — S79921A Unspecified injury of right thigh, initial encounter: Secondary | ICD-10-CM | POA: Diagnosis not present

## 2018-04-16 DIAGNOSIS — M25511 Pain in right shoulder: Secondary | ICD-10-CM | POA: Diagnosis not present

## 2018-04-16 DIAGNOSIS — I10 Essential (primary) hypertension: Secondary | ICD-10-CM | POA: Diagnosis not present

## 2018-04-16 DIAGNOSIS — Y998 Other external cause status: Secondary | ICD-10-CM | POA: Diagnosis not present

## 2018-04-16 DIAGNOSIS — S065X0A Traumatic subdural hemorrhage without loss of consciousness, initial encounter: Secondary | ICD-10-CM | POA: Diagnosis not present

## 2018-04-16 DIAGNOSIS — M79604 Pain in right leg: Secondary | ICD-10-CM | POA: Diagnosis not present

## 2018-04-16 DIAGNOSIS — W19XXXA Unspecified fall, initial encounter: Secondary | ICD-10-CM | POA: Diagnosis not present

## 2018-04-16 DIAGNOSIS — S79911A Unspecified injury of right hip, initial encounter: Secondary | ICD-10-CM | POA: Diagnosis not present

## 2018-04-16 DIAGNOSIS — S065X9A Traumatic subdural hemorrhage with loss of consciousness of unspecified duration, initial encounter: Secondary | ICD-10-CM | POA: Diagnosis not present

## 2018-04-16 DIAGNOSIS — I69351 Hemiplegia and hemiparesis following cerebral infarction affecting right dominant side: Secondary | ICD-10-CM | POA: Diagnosis not present

## 2018-04-16 DIAGNOSIS — W1839XA Other fall on same level, initial encounter: Secondary | ICD-10-CM | POA: Diagnosis not present

## 2018-04-16 DIAGNOSIS — R0902 Hypoxemia: Secondary | ICD-10-CM | POA: Diagnosis not present

## 2018-04-16 DIAGNOSIS — G40909 Epilepsy, unspecified, not intractable, without status epilepticus: Secondary | ICD-10-CM | POA: Diagnosis not present

## 2018-04-16 DIAGNOSIS — I459 Conduction disorder, unspecified: Secondary | ICD-10-CM | POA: Diagnosis not present

## 2018-04-16 DIAGNOSIS — R5381 Other malaise: Secondary | ICD-10-CM | POA: Diagnosis not present

## 2018-04-16 DIAGNOSIS — Z79899 Other long term (current) drug therapy: Secondary | ICD-10-CM | POA: Diagnosis not present

## 2018-04-16 DIAGNOSIS — R9431 Abnormal electrocardiogram [ECG] [EKG]: Secondary | ICD-10-CM | POA: Diagnosis not present

## 2018-04-16 DIAGNOSIS — Z7901 Long term (current) use of anticoagulants: Secondary | ICD-10-CM | POA: Diagnosis not present

## 2018-04-16 DIAGNOSIS — S199XXA Unspecified injury of neck, initial encounter: Secondary | ICD-10-CM | POA: Diagnosis not present

## 2018-04-16 DIAGNOSIS — G894 Chronic pain syndrome: Secondary | ICD-10-CM | POA: Diagnosis not present

## 2018-04-16 DIAGNOSIS — S4991XA Unspecified injury of right shoulder and upper arm, initial encounter: Secondary | ICD-10-CM | POA: Diagnosis not present

## 2018-04-16 DIAGNOSIS — M79601 Pain in right arm: Secondary | ICD-10-CM | POA: Diagnosis not present

## 2018-04-16 DIAGNOSIS — S0990XA Unspecified injury of head, initial encounter: Secondary | ICD-10-CM | POA: Diagnosis not present

## 2018-04-16 DIAGNOSIS — R51 Headache: Secondary | ICD-10-CM | POA: Diagnosis not present

## 2018-04-17 ENCOUNTER — Telehealth: Payer: Self-pay | Admitting: *Deleted

## 2018-04-17 DIAGNOSIS — S065X9A Traumatic subdural hemorrhage with loss of consciousness of unspecified duration, initial encounter: Secondary | ICD-10-CM | POA: Diagnosis not present

## 2018-04-17 DIAGNOSIS — R5381 Other malaise: Secondary | ICD-10-CM | POA: Diagnosis not present

## 2018-04-17 DIAGNOSIS — Z86711 Personal history of pulmonary embolism: Secondary | ICD-10-CM | POA: Diagnosis not present

## 2018-04-17 DIAGNOSIS — Z8673 Personal history of transient ischemic attack (TIA), and cerebral infarction without residual deficits: Secondary | ICD-10-CM | POA: Diagnosis not present

## 2018-04-17 DIAGNOSIS — M25511 Pain in right shoulder: Secondary | ICD-10-CM | POA: Diagnosis not present

## 2018-04-17 DIAGNOSIS — I6200 Nontraumatic subdural hemorrhage, unspecified: Secondary | ICD-10-CM | POA: Diagnosis not present

## 2018-04-17 DIAGNOSIS — G894 Chronic pain syndrome: Secondary | ICD-10-CM | POA: Diagnosis not present

## 2018-04-17 DIAGNOSIS — G40909 Epilepsy, unspecified, not intractable, without status epilepticus: Secondary | ICD-10-CM | POA: Diagnosis not present

## 2018-04-17 NOTE — Telephone Encounter (Signed)
Received Physician Orders from [facility]; forwarded to provider/SLS 09/20

## 2018-04-21 ENCOUNTER — Telehealth: Payer: Self-pay

## 2018-04-21 ENCOUNTER — Other Ambulatory Visit: Payer: Self-pay | Admitting: Family Medicine

## 2018-04-21 ENCOUNTER — Telehealth: Payer: Self-pay | Admitting: *Deleted

## 2018-04-21 ENCOUNTER — Telehealth: Payer: Self-pay | Admitting: Family Medicine

## 2018-04-21 DIAGNOSIS — I639 Cerebral infarction, unspecified: Secondary | ICD-10-CM

## 2018-04-21 DIAGNOSIS — I10 Essential (primary) hypertension: Secondary | ICD-10-CM

## 2018-04-21 MED ORDER — LOSARTAN POTASSIUM 100 MG PO TABS: 100.0000 mg | ORAL_TABLET | Freq: Every day | ORAL | 0 refills | Status: DC

## 2018-04-21 MED FILL — LOSARTAN POTASSIUM 100 MG T: 100 | 15 days supply | Qty: 15 | Fill #0

## 2018-04-21 NOTE — Telephone Encounter (Signed)
She is on remeron 15 mg --- is she taking it?  If yes we can try increasing that first to 30 mg #30  1 po qhs , 2 refills

## 2018-04-21 NOTE — Telephone Encounter (Signed)
Copied from CRM [id number]. Topic: General - Other >> Apr 21, 2018  3:53 PM Mcneil, Ja-Kwan wrote: Reason for CRM: Pt daughter [name] is requesting a 14 day supply of losartan (COZAAR) 100 MG tablet until pt mail order arrives in 7-10 days. She request that the Rx for the 14 day supply be [facility] [place] - [id number] [phone] (Phone)  [phone] (Fax). Cb# [phone]

## 2018-04-21 NOTE — Telephone Encounter (Signed)
Spoke with daughter I will call the PT office in the morning [phone] to see if they take a verbal order from us.  Patient fell and needs order to continue PT.

## 2018-04-21 NOTE — Telephone Encounter (Signed)
Copied from CRM [id number]. Topic: Quick Communication - See Telephone Encounter >> Apr 20, 2018  9:39 AM Herby AbrahamJohnson, Shiquita C wrote: CRM for notification. See Telephone encounter for: 04/20/18.  Pt's daughter called in to request [facility] Pt had previous orders placed but had a fall and now location needs updated orders to continue care. They would like to have orders as soon as possible because pt has an apt on Wednesday that they would like to keep.   CB [phone] OR [phone] - [name]- for update. >> Apr 21, 2018  2:38 PM Windy KalataMichael, Anaih Brander L, NT wrote: Patient daughter is calling to follow back up on this request. Please contact,

## 2018-04-21 NOTE — Telephone Encounter (Signed)
Daughter Cherisse sSharl Matated that they have been meeting with Camelia Engerri but she would like to see if her mother can be put on some kind of medication.  Her mother has been going to one extreme to the next: she is mean, crying, telling people that she is not being fed, she is being starved or given her medication.

## 2018-04-21 NOTE — Telephone Encounter (Signed)
Rx sent 

## 2018-04-22 DIAGNOSIS — I69351 Hemiplegia and hemiparesis following cerebral infarction affecting right dominant side: Secondary | ICD-10-CM | POA: Diagnosis not present

## 2018-04-22 DIAGNOSIS — Z9181 History of falling: Secondary | ICD-10-CM | POA: Diagnosis not present

## 2018-04-22 DIAGNOSIS — R531 Weakness: Secondary | ICD-10-CM | POA: Diagnosis not present

## 2018-04-22 DIAGNOSIS — R262 Difficulty in walking, not elsewhere classified: Secondary | ICD-10-CM | POA: Diagnosis not present

## 2018-04-22 MED ORDER — MIRTAZAPINE 30 MG PO TABS: 30.0000 mg | ORAL_TABLET | Freq: Every day | ORAL | 1 refills | Status: DC

## 2018-04-22 NOTE — Telephone Encounter (Signed)
Left message on machine at PT office [phone]) to call back to let us know what they need

## 2018-04-22 NOTE — Telephone Encounter (Signed)
Advise daughter of note.  She states that she does take remeron every day.  Advised to increase to 30mg .   New rx sent to pharmacy.  She will let us know if this works or not.

## 2018-04-22 NOTE — Telephone Encounter (Signed)
Pt's daughter dropped off orders that the physical therapist needs. I will give to [facility] in the morning to give to Dr. Laury Axon.

## 2018-04-22 NOTE — Telephone Encounter (Signed)
Spoke with PT and they stated they needed verbal orders to resume therapy.  Also they requested an order to do a custom wheelchair evaluation.  Order done just awaiting signature from provider.  Will fax 04/23/18 to [phone]

## 2018-04-23 ENCOUNTER — Telehealth: Payer: Self-pay | Admitting: *Deleted

## 2018-04-23 DIAGNOSIS — I2699 Other pulmonary embolism without acute cor pulmonale: Secondary | ICD-10-CM | POA: Diagnosis not present

## 2018-04-23 DIAGNOSIS — S06359A Traumatic hemorrhage of left cerebrum with loss of consciousness of unspecified duration, initial encounter: Secondary | ICD-10-CM

## 2018-04-23 DIAGNOSIS — I639 Cerebral infarction, unspecified: Secondary | ICD-10-CM

## 2018-04-23 DIAGNOSIS — I10 Essential (primary) hypertension: Secondary | ICD-10-CM | POA: Diagnosis not present

## 2018-04-23 DIAGNOSIS — I69351 Hemiplegia and hemiparesis following cerebral infarction affecting right dominant side: Secondary | ICD-10-CM

## 2018-04-23 DIAGNOSIS — R4701 Aphasia: Secondary | ICD-10-CM

## 2018-04-23 DIAGNOSIS — Z950 Presence of cardiac pacemaker: Secondary | ICD-10-CM | POA: Diagnosis not present

## 2018-04-23 DIAGNOSIS — S065X9A Traumatic subdural hemorrhage with loss of consciousness of unspecified duration, initial encounter: Secondary | ICD-10-CM | POA: Diagnosis not present

## 2018-04-23 MED ORDER — TRANSFER BOARD MISC: 1.0000 | 0 refills | Status: DC | PRN

## 2018-04-23 NOTE — Telephone Encounter (Signed)
Per request of pt's daughter and approval of PCP, placed order for OT & ST to be added to her PT at [facility] in EMR. Also, ordered DME Hospital Bed, electric with trapeze and Misc Item: Transfer Board [for bed], which were faxed to [facility] in [facility] with confirmation given to MA/SLS 09/26

## 2018-04-27 ENCOUNTER — Telehealth: Payer: Self-pay | Admitting: *Deleted

## 2018-04-27 NOTE — Telephone Encounter (Signed)
Received Physician Orders from [facility] for DME Trapeze and Transfer Board for hospital bed use; forwarded to provider/SLS 09/30

## 2018-04-28 DIAGNOSIS — R531 Weakness: Secondary | ICD-10-CM | POA: Diagnosis not present

## 2018-04-28 DIAGNOSIS — I69351 Hemiplegia and hemiparesis following cerebral infarction affecting right dominant side: Secondary | ICD-10-CM | POA: Diagnosis not present

## 2018-04-28 DIAGNOSIS — R4701 Aphasia: Secondary | ICD-10-CM | POA: Diagnosis not present

## 2018-04-28 DIAGNOSIS — Z7409 Other reduced mobility: Secondary | ICD-10-CM | POA: Diagnosis not present

## 2018-04-28 DIAGNOSIS — R262 Difficulty in walking, not elsewhere classified: Secondary | ICD-10-CM | POA: Diagnosis not present

## 2018-04-28 DIAGNOSIS — Z9181 History of falling: Secondary | ICD-10-CM | POA: Diagnosis not present

## 2018-04-28 NOTE — Telephone Encounter (Signed)
Orders faxed

## 2018-04-29 ENCOUNTER — Ambulatory Visit: Payer: Medicare HMO | Admitting: *Deleted

## 2018-04-29 ENCOUNTER — Ambulatory Visit (INDEPENDENT_AMBULATORY_CARE_PROVIDER_SITE_OTHER): Payer: Medicare HMO | Admitting: Psychology

## 2018-04-29 DIAGNOSIS — F4323 Adjustment disorder with mixed anxiety and depressed mood: Secondary | ICD-10-CM | POA: Diagnosis not present

## 2018-04-30 ENCOUNTER — Telehealth: Payer: Self-pay | Admitting: Family Medicine

## 2018-04-30 DIAGNOSIS — I69351 Hemiplegia and hemiparesis following cerebral infarction affecting right dominant side: Secondary | ICD-10-CM | POA: Diagnosis not present

## 2018-04-30 DIAGNOSIS — Z9181 History of falling: Secondary | ICD-10-CM | POA: Diagnosis not present

## 2018-04-30 DIAGNOSIS — R4701 Aphasia: Secondary | ICD-10-CM | POA: Diagnosis not present

## 2018-04-30 DIAGNOSIS — R262 Difficulty in walking, not elsewhere classified: Secondary | ICD-10-CM | POA: Diagnosis not present

## 2018-04-30 DIAGNOSIS — Z7409 Other reduced mobility: Secondary | ICD-10-CM | POA: Diagnosis not present

## 2018-04-30 DIAGNOSIS — R531 Weakness: Secondary | ICD-10-CM | POA: Diagnosis not present

## 2018-04-30 DIAGNOSIS — I2699 Other pulmonary embolism without acute cor pulmonale: Secondary | ICD-10-CM | POA: Diagnosis not present

## 2018-04-30 DIAGNOSIS — I639 Cerebral infarction, unspecified: Secondary | ICD-10-CM

## 2018-04-30 NOTE — Telephone Encounter (Signed)
Copied from CRM [id number]. Topic: General - Other >> Apr 30, 2018 11:14 AM Debroah Loop wrote: Reason for CRM: Wyn Forster, Speech Therapist t [facility] center needs an order for OT eval sent to [facility].

## 2018-05-01 DIAGNOSIS — E1122 Type 2 diabetes mellitus with diabetic chronic kidney disease: Secondary | ICD-10-CM | POA: Diagnosis not present

## 2018-05-01 DIAGNOSIS — G40109 Localization-related (focal) (partial) symptomatic epilepsy and epileptic syndromes with simple partial seizures, not intractable, without status epilepticus: Secondary | ICD-10-CM | POA: Diagnosis not present

## 2018-05-01 DIAGNOSIS — Z79899 Other long term (current) drug therapy: Secondary | ICD-10-CM | POA: Diagnosis not present

## 2018-05-01 DIAGNOSIS — I69398 Other sequelae of cerebral infarction: Secondary | ICD-10-CM | POA: Diagnosis not present

## 2018-05-01 DIAGNOSIS — R569 Unspecified convulsions: Secondary | ICD-10-CM | POA: Diagnosis not present

## 2018-05-01 NOTE — Telephone Encounter (Signed)
rx faxed to [facility] center [phone]

## 2018-05-05 DIAGNOSIS — R531 Weakness: Secondary | ICD-10-CM | POA: Diagnosis not present

## 2018-05-05 DIAGNOSIS — I69351 Hemiplegia and hemiparesis following cerebral infarction affecting right dominant side: Secondary | ICD-10-CM | POA: Diagnosis not present

## 2018-05-05 DIAGNOSIS — R569 Unspecified convulsions: Secondary | ICD-10-CM | POA: Diagnosis not present

## 2018-05-05 DIAGNOSIS — Z9181 History of falling: Secondary | ICD-10-CM | POA: Diagnosis not present

## 2018-05-05 DIAGNOSIS — M4800 Spinal stenosis, site unspecified: Secondary | ICD-10-CM | POA: Diagnosis not present

## 2018-05-05 DIAGNOSIS — I69398 Other sequelae of cerebral infarction: Secondary | ICD-10-CM | POA: Diagnosis not present

## 2018-05-05 DIAGNOSIS — R262 Difficulty in walking, not elsewhere classified: Secondary | ICD-10-CM | POA: Diagnosis not present

## 2018-05-05 DIAGNOSIS — Z7409 Other reduced mobility: Secondary | ICD-10-CM | POA: Diagnosis not present

## 2018-05-05 DIAGNOSIS — R4701 Aphasia: Secondary | ICD-10-CM | POA: Diagnosis not present

## 2018-05-06 DIAGNOSIS — I639 Cerebral infarction, unspecified: Secondary | ICD-10-CM | POA: Diagnosis not present

## 2018-05-06 DIAGNOSIS — R569 Unspecified convulsions: Secondary | ICD-10-CM | POA: Diagnosis not present

## 2018-05-06 DIAGNOSIS — S065X0A Traumatic subdural hemorrhage without loss of consciousness, initial encounter: Secondary | ICD-10-CM | POA: Diagnosis not present

## 2018-05-06 DIAGNOSIS — M4800 Spinal stenosis, site unspecified: Secondary | ICD-10-CM | POA: Diagnosis not present

## 2018-05-06 DIAGNOSIS — R4701 Aphasia: Secondary | ICD-10-CM | POA: Diagnosis not present

## 2018-05-06 DIAGNOSIS — I69398 Other sequelae of cerebral infarction: Secondary | ICD-10-CM | POA: Diagnosis not present

## 2018-05-06 DIAGNOSIS — I69351 Hemiplegia and hemiparesis following cerebral infarction affecting right dominant side: Secondary | ICD-10-CM | POA: Diagnosis not present

## 2018-05-06 DIAGNOSIS — N189 Chronic kidney disease, unspecified: Secondary | ICD-10-CM | POA: Diagnosis not present

## 2018-05-06 DIAGNOSIS — Z86711 Personal history of pulmonary embolism: Secondary | ICD-10-CM | POA: Diagnosis not present

## 2018-05-06 DIAGNOSIS — I10 Essential (primary) hypertension: Secondary | ICD-10-CM | POA: Diagnosis not present

## 2018-05-06 DIAGNOSIS — G40909 Epilepsy, unspecified, not intractable, without status epilepticus: Secondary | ICD-10-CM | POA: Diagnosis not present

## 2018-05-06 DIAGNOSIS — I2699 Other pulmonary embolism without acute cor pulmonale: Secondary | ICD-10-CM | POA: Diagnosis not present

## 2018-05-06 DIAGNOSIS — E538 Deficiency of other specified B group vitamins: Secondary | ICD-10-CM | POA: Diagnosis not present

## 2018-05-07 ENCOUNTER — Other Ambulatory Visit: Payer: Self-pay | Admitting: Family Medicine

## 2018-05-07 ENCOUNTER — Telehealth: Payer: Self-pay | Admitting: *Deleted

## 2018-05-07 DIAGNOSIS — Z9181 History of falling: Secondary | ICD-10-CM | POA: Diagnosis not present

## 2018-05-07 DIAGNOSIS — I69351 Hemiplegia and hemiparesis following cerebral infarction affecting right dominant side: Secondary | ICD-10-CM | POA: Diagnosis not present

## 2018-05-07 DIAGNOSIS — R262 Difficulty in walking, not elsewhere classified: Secondary | ICD-10-CM | POA: Diagnosis not present

## 2018-05-07 DIAGNOSIS — R4701 Aphasia: Secondary | ICD-10-CM | POA: Diagnosis not present

## 2018-05-07 DIAGNOSIS — Z7409 Other reduced mobility: Secondary | ICD-10-CM | POA: Diagnosis not present

## 2018-05-07 DIAGNOSIS — R531 Weakness: Secondary | ICD-10-CM | POA: Diagnosis not present

## 2018-05-07 DIAGNOSIS — Z1231 Encounter for screening mammogram for malignant neoplasm of breast: Secondary | ICD-10-CM

## 2018-05-07 NOTE — Telephone Encounter (Signed)
Received Physician Orders from [facility]; forwarded to provider/SLS 10/10

## 2018-05-08 ENCOUNTER — Telehealth: Payer: Self-pay | Admitting: *Deleted

## 2018-05-08 NOTE — Telephone Encounter (Signed)
Received Physician Orders for Speech Therapy from [facility]; forwarded to provider/SLS 10/11

## 2018-05-11 DIAGNOSIS — I69351 Hemiplegia and hemiparesis following cerebral infarction affecting right dominant side: Secondary | ICD-10-CM | POA: Diagnosis not present

## 2018-05-11 DIAGNOSIS — I2699 Other pulmonary embolism without acute cor pulmonale: Secondary | ICD-10-CM | POA: Diagnosis not present

## 2018-05-11 DIAGNOSIS — R4701 Aphasia: Secondary | ICD-10-CM | POA: Diagnosis not present

## 2018-05-12 ENCOUNTER — Telehealth: Payer: Self-pay | Admitting: *Deleted

## 2018-05-12 DIAGNOSIS — I69351 Hemiplegia and hemiparesis following cerebral infarction affecting right dominant side: Secondary | ICD-10-CM | POA: Diagnosis not present

## 2018-05-12 DIAGNOSIS — Z9181 History of falling: Secondary | ICD-10-CM | POA: Diagnosis not present

## 2018-05-12 DIAGNOSIS — R262 Difficulty in walking, not elsewhere classified: Secondary | ICD-10-CM | POA: Diagnosis not present

## 2018-05-12 DIAGNOSIS — Z7409 Other reduced mobility: Secondary | ICD-10-CM | POA: Diagnosis not present

## 2018-05-12 DIAGNOSIS — R4701 Aphasia: Secondary | ICD-10-CM | POA: Diagnosis not present

## 2018-05-12 DIAGNOSIS — R531 Weakness: Secondary | ICD-10-CM | POA: Diagnosis not present

## 2018-05-12 NOTE — Telephone Encounter (Signed)
Received Physician Orders from [facility] for Speech Therapy POC; forwarded to provider/SLS 10/15

## 2018-05-13 DIAGNOSIS — I69351 Hemiplegia and hemiparesis following cerebral infarction affecting right dominant side: Secondary | ICD-10-CM | POA: Diagnosis not present

## 2018-05-13 DIAGNOSIS — I2699 Other pulmonary embolism without acute cor pulmonale: Secondary | ICD-10-CM | POA: Diagnosis not present

## 2018-05-13 DIAGNOSIS — R4701 Aphasia: Secondary | ICD-10-CM | POA: Diagnosis not present

## 2018-05-14 DIAGNOSIS — I69351 Hemiplegia and hemiparesis following cerebral infarction affecting right dominant side: Secondary | ICD-10-CM | POA: Diagnosis not present

## 2018-05-14 DIAGNOSIS — R531 Weakness: Secondary | ICD-10-CM | POA: Diagnosis not present

## 2018-05-14 DIAGNOSIS — Z7409 Other reduced mobility: Secondary | ICD-10-CM | POA: Diagnosis not present

## 2018-05-14 DIAGNOSIS — R262 Difficulty in walking, not elsewhere classified: Secondary | ICD-10-CM | POA: Diagnosis not present

## 2018-05-14 DIAGNOSIS — R4701 Aphasia: Secondary | ICD-10-CM | POA: Diagnosis not present

## 2018-05-14 DIAGNOSIS — Z9181 History of falling: Secondary | ICD-10-CM | POA: Diagnosis not present

## 2018-05-18 ENCOUNTER — Telehealth: Payer: Self-pay | Admitting: *Deleted

## 2018-05-18 DIAGNOSIS — Z7409 Other reduced mobility: Secondary | ICD-10-CM | POA: Diagnosis not present

## 2018-05-18 DIAGNOSIS — R262 Difficulty in walking, not elsewhere classified: Secondary | ICD-10-CM | POA: Diagnosis not present

## 2018-05-18 DIAGNOSIS — I69351 Hemiplegia and hemiparesis following cerebral infarction affecting right dominant side: Secondary | ICD-10-CM | POA: Diagnosis not present

## 2018-05-18 DIAGNOSIS — Z9181 History of falling: Secondary | ICD-10-CM | POA: Diagnosis not present

## 2018-05-18 DIAGNOSIS — R4701 Aphasia: Secondary | ICD-10-CM | POA: Diagnosis not present

## 2018-05-18 DIAGNOSIS — R531 Weakness: Secondary | ICD-10-CM | POA: Diagnosis not present

## 2018-05-18 NOTE — Telephone Encounter (Signed)
[facility]; forwarded to provider/SLS 10/21

## 2018-05-19 ENCOUNTER — Ambulatory Visit (HOSPITAL_BASED_OUTPATIENT_CLINIC_OR_DEPARTMENT_OTHER)
Admission: RE | Admit: 2018-05-19 | Discharge: 2018-05-19 | Disposition: A | Discharge status: Home / Self Care | Payer: Medicare HMO | Source: Ambulatory Visit | Attending: Family Medicine | Admitting: Family Medicine

## 2018-05-19 ENCOUNTER — Telehealth: Payer: Self-pay | Admitting: *Deleted

## 2018-05-19 DIAGNOSIS — Z1231 Encounter for screening mammogram for malignant neoplasm of breast: Secondary | ICD-10-CM | POA: Insufficient documentation

## 2018-05-19 NOTE — Telephone Encounter (Signed)
Received Physician Orders/Medicare OT Plan Certification from [facility]; forwarded to provider/SLS 10/22

## 2018-05-20 DIAGNOSIS — Z7409 Other reduced mobility: Secondary | ICD-10-CM | POA: Diagnosis not present

## 2018-05-20 DIAGNOSIS — R531 Weakness: Secondary | ICD-10-CM | POA: Diagnosis not present

## 2018-05-20 DIAGNOSIS — R262 Difficulty in walking, not elsewhere classified: Secondary | ICD-10-CM | POA: Diagnosis not present

## 2018-05-20 DIAGNOSIS — Z9181 History of falling: Secondary | ICD-10-CM | POA: Diagnosis not present

## 2018-05-20 DIAGNOSIS — I2699 Other pulmonary embolism without acute cor pulmonale: Secondary | ICD-10-CM | POA: Diagnosis not present

## 2018-05-20 DIAGNOSIS — I69351 Hemiplegia and hemiparesis following cerebral infarction affecting right dominant side: Secondary | ICD-10-CM | POA: Diagnosis not present

## 2018-05-20 DIAGNOSIS — R4701 Aphasia: Secondary | ICD-10-CM | POA: Diagnosis not present

## 2018-05-22 DIAGNOSIS — R4701 Aphasia: Secondary | ICD-10-CM | POA: Diagnosis not present

## 2018-05-22 DIAGNOSIS — R531 Weakness: Secondary | ICD-10-CM | POA: Diagnosis not present

## 2018-05-22 DIAGNOSIS — Z9181 History of falling: Secondary | ICD-10-CM | POA: Diagnosis not present

## 2018-05-22 DIAGNOSIS — I69351 Hemiplegia and hemiparesis following cerebral infarction affecting right dominant side: Secondary | ICD-10-CM | POA: Diagnosis not present

## 2018-05-22 DIAGNOSIS — R262 Difficulty in walking, not elsewhere classified: Secondary | ICD-10-CM | POA: Diagnosis not present

## 2018-05-22 DIAGNOSIS — Z7409 Other reduced mobility: Secondary | ICD-10-CM | POA: Diagnosis not present

## 2018-05-22 NOTE — Progress Notes (Addendum)
Subjective:   Kathleen Jensen is a 67 y.o. female who presents for Medicare Annual (Subsequent) preventive examination.  Review of Systems: No ROS.  Medicare Wellness Visit. Additional risk factors are reflected in the social history. Cardiac Risk Factors include: advanced age (>79men, >32 women);diabetes mellitus;dyslipidemia;hypertension;sedentary lifestyle;obesity (BMI >30kg/m2) Sleep patterns: no issues Home Safety/Smoke Alarms: Feels safe in home. Smoke alarms in place.   Female:        Mammo- utd       Dexa scan- declines       CCS- pt reported-normal-07/29/13     Objective:     Vitals: BP 138/76 (BP Location: Left Arm, Patient Position: Sitting, Cuff Size: Normal)   Pulse 60   Wt 202 lb (91.6 kg)   SpO2 95%   BMI 34.49 kg/m   Body mass index is 34.49 kg/m.  Advanced Directives 05/25/2018 12/02/2017 12/01/2017 10/05/2017 04/28/2017  Does Patient Have a Medical Advance Directive? (No Data) No No No No  Would patient like information on creating a medical advance directive? - Yes (MAU/Ambulatory/Procedural Areas - Information given) - - Yes (MAU/Ambulatory/Procedural Areas - Information given)    Tobacco Social History   Tobacco Use  Smoking Status Former Smoker  . Years: 4.00  . Last attempt to quit: 07/30/1979  . Years since quitting: 38.8  Smokeless Tobacco Never Used     Counseling given: Not Answered   Clinical Intake: Pain : No/denies pain     Past Medical History:  Diagnosis Date  . Arthritis   . 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 Hypertension   . Seizures (HCC)   . Stroke [facility])    Past Surgical History:  Procedure Laterality Date  . BACK SURGERY     Dec 18, 2016 Dr.Torrealba  . BREAST BIOPSY Right   . BREAST EXCISIONAL BIOPSY    . CERVICAL SPINE SURGERY     C4  . CESAREAN SECTION    . GASTRIC BYPASS  03/2007  . TONSILLECTOMY     Family History  Problem Relation Age of Onset  . High blood  pressure Mother   . Diabetes Mother   . Diabetes Father   . Stroke Father   . Cancer Sister        Colorectal cancer  . Cancer Brother        colon   Social History   Socioeconomic History  . Marital status: Divorced    Spouse name: Not on file  . Number of children: 3  . Years of education: 10yrs  . Highest education level: Not on file  Occupational History  . 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 Financial resource strain: Not on file  . Food insecurity:    Worry: Not on file    Inability: Not on file  . Transportation needs:    Medical: Not on file    Non-medical: Not on file  Tobacco Use  . Smoking status: Former Smoker    Years: 4.00    Last attempt to quit: 07/30/1979    Years since quitting: 38.8  . Smokeless tobacco: Never Used  Substance and Sexual Activity  . Alcohol use: No    Alcohol/week: 0.0 standard drinks  . Drug use: No  . Sexual activity: Never    Partners: Male  Lifestyle  . Physical activity:    Days per week: Not on file  Minutes per session: Not on file  . Stress: Not on file  Relationships  . Social connections:    Talks on phone: Not on file    Gets together: Not on file    Attends religious service: Not on file    Active member of club or organization: Not on file    Attends meetings of clubs or organizations: Not on file    Relationship status: Not on file  Other Topics Concern  . Not on file  Social History Narrative   Patient lives at home alone.   Caffeine Use: 1 cup daily   1 dog    Outpatient Encounter Medications as of 05/25/2018  Medication Sig  . acetaminophen (TYLENOL) 325 MG tablet Take 1 tablet by mouth every 8 (eight) hours as needed.  Marland Kitchen albuterol (PROVENTIL HFA;VENTOLIN HFA) 108 (90 Base) MCG/ACT inhaler Inhale 2 puffs into the lungs every 6 (six) hours as needed for wheezing or shortness of breath.  . calcium carbonate (OS-CAL - DOSED IN MG OF ELEMENTAL  CALCIUM) 1250 (500 Ca) MG tablet 1 tablet daily.  Marland Kitchen gabapentin (NEURONTIN) 300 MG capsule Take 1 capsule by mouth 3 (three) times daily.  . hydrochlorothiazide (MICROZIDE) 12.5 MG capsule Take 1 capsule (12.5 mg total) by mouth daily.  Marland Kitchen levETIRAcetam (KEPPRA) 1000 MG tablet 2 po bid  . levothyroxine (SYNTHROID, LEVOTHROID) 88 MCG tablet Take 1 tablet (88 mcg total) by mouth daily before breakfast.  . losartan (COZAAR) 100 MG tablet Take 1 tablet (100 mg total) by mouth daily.  . magnesium oxide (MAG-OX) 400 MG tablet Take 400 mg by mouth 2 (two) times daily.  . mirtazapine (REMERON) 30 MG tablet Take 1 tablet (30 mg total) by mouth at bedtime.  . Misc. Devices (TRANSFER BOARD) MISC 1 each by Does not apply route as needed (For Bed Transfer).  . pantoprazole (PROTONIX) 40 MG tablet Take 1 tablet (40 mg total) by mouth daily.  . phenytoin (DILANTIN) 50 MG tablet 200mg  in the morning and 200mg  in the evening  . polyethylene glycol (MIRALAX / GLYCOLAX) packet Take 17 g by mouth daily as needed.   . senna-docusate (SENOKOT-S) 8.6-50 MG tablet Take 1 tablet by mouth 2 (two) times daily.  Marland Kitchen tiZANidine (ZANAFLEX) 4 MG tablet Take 1 tablet (4 mg total) by mouth every 6 (six) hours as needed for muscle spasms.  . valACYclovir (VALTREX) 1000 MG tablet Take 1 tablet (1,000 mg total) by mouth 3 (three) times daily.  Marland Kitchen warfarin (COUMADIN) 5 MG tablet 10 mg on mon and 5 mg all other days (Patient taking differently: TAKE AS DIRECTED BY COUMADIN CLINIC)   No facility-administered encounter medications on file as of 05/25/2018.     Activities of Daily Living In your present state of health, do you have any difficulty performing the following activities: 05/25/2018 12/02/2017  Hearing? N N  Vision? N N  Difficulty concentrating or making decisions? Y N  Walking or climbing stairs? Y Y  Dressing or bathing? Y Y  Doing errands, shopping? Malvin Johns  Preparing Food and eating ? Y Y  Using the Toilet? Y Y  In the  past six months, have you accidently leaked urine? Y Y  Do you have problems with loss of bowel control? Y N  Managing your Medications? Y Y  Managing your Finances? Malvin Johns  Housekeeping or managing your Housekeeping? Y Y  Some recent data might be hidden    Patient Care Team: Zola Button, Grayling Congress, DO  as PCP - General (Family Medicine) Blondell Reveal, MD as Consulting Physician (Obstetrics and Gynecology) Titus Dubin. Loletta Parish, MD as Consulting Physician (Pain Medicine) Rolene Arbour, MD as Referring Physician (Neurology)    Assessment:   This is a routine wellness examination for [place]. Physical assessment deferred to PCP.  Exercise Activities and Dietary recommendations Current Exercise Habits: The patient does not participate in regular exercise at present, Exercise limited by: neurologic condition(s)(hx stroke) Diet (meal preparation, eat out, water intake, caffeinated beverages, dairy products, fruits and vegetables): in general, an "unhealthy" diet    Goals    . Patient Stated     Would like for her memory to improve.       Fall Risk Fall Risk  05/25/2018 12/02/2017 04/28/2017 04/29/2016 01/26/2016  Falls in the past year? Yes No Yes Yes Yes  Number falls in past yr: 2 or more - 2 or more 1 1  Comment - - - patient report tripping on step Tripped in the [place]  Injury with Fall? Yes - No No -  Risk for fall due to : Mental status change;History of fall(s);Impaired balance/gait Impaired balance/gait;Impaired mobility - - -  Follow up Education provided;Falls prevention discussed - Education provided;Falls prevention discussed - -    Depression Screen PHQ 2/9 Scores 05/25/2018 12/02/2017 04/28/2017 04/29/2016  PHQ - 2 Score 0 0 0 0     Cognitive Function MMSE - Mini Mental State Exam 05/25/2018 04/28/2017  Not completed: Unable to complete -  Orientation to time - 5  Orientation to Place - 5  Registration - 3  Attention/ Calculation - 5  Recall - 3  Language- name 2  objects - 2  Language- repeat - 1  Language- follow 3 step command - 3  Language- read & follow direction - 1  Write a sentence - 1  Copy design - 1  Total score - 30        Immunization History  Administered Date(s) Administered  . Influenza Whole 05/04/2010  . Influenza, High Dose Seasonal PF 04/29/2016  . Influenza-Unspecified 04/29/2015, 04/19/2017  . Pneumococcal-Unspecified 07/29/2013  . Td 07/30/2007    Screening Tests Health Maintenance  Topic Date Due  . Hepatitis C Screening  15-Apr-1951  . FOOT EXAM  08/12/1960  . OPHTHALMOLOGY EXAM  08/12/1960  . PNA vac Low Risk Adult (1 of 2 - PCV13) 08/13/2015  . TETANUS/TDAP  07/29/2017  . INFLUENZA VACCINE  02/26/2018  . HEMOGLOBIN A1C  04/08/2018  . COLONOSCOPY  07/29/2018  . MAMMOGRAM  05/19/2020  . DEXA SCAN  Completed      Plan:    Please schedule your next medicare wellness visit with me in 1 yr.  Eat heart healthy diet (full of fruits, vegetables, whole grains, lean protein, water--limit salt, fat, and sugar intake) and increase physical activity as tolerated.  Bring a copy of your living will and/or healthcare power of attorney to your next office visit.   I have personally reviewed and noted the following in the patient's chart:   . Medical and social history . Use of alcohol, tobacco or illicit drugs  . Current medications and supplements . Functional ability and status . Nutritional status . Physical activity . Advanced directives . List of other physicians . Hospitalizations, surgeries, and ER visits in previous 12 months . Vitals . Screenings to include cognitive, depression, and falls . Referrals and appointments  In addition, I have reviewed and discussed with patient certain preventive protocols, quality metrics, and best  practice recommendations. A written personalized care plan for preventive services as well as general preventive health recommendations were provided to patient.     Mady Haagensen [place], [place]  05/25/2018   Willow Ora, MD

## 2018-05-25 ENCOUNTER — Ambulatory Visit (INDEPENDENT_AMBULATORY_CARE_PROVIDER_SITE_OTHER): Payer: Medicare HMO | Admitting: *Deleted

## 2018-05-25 ENCOUNTER — Encounter: Payer: Self-pay | Admitting: *Deleted

## 2018-05-25 VITALS — BP 138/76 | HR 60 | Wt 202.0 lb

## 2018-05-25 DIAGNOSIS — Z7409 Other reduced mobility: Secondary | ICD-10-CM | POA: Diagnosis not present

## 2018-05-25 DIAGNOSIS — Z0000 Encounter for general adult medical examination without abnormal findings: Secondary | ICD-10-CM | POA: Diagnosis not present

## 2018-05-25 DIAGNOSIS — Z9181 History of falling: Secondary | ICD-10-CM | POA: Diagnosis not present

## 2018-05-25 DIAGNOSIS — R4701 Aphasia: Secondary | ICD-10-CM | POA: Diagnosis not present

## 2018-05-25 DIAGNOSIS — I69351 Hemiplegia and hemiparesis following cerebral infarction affecting right dominant side: Secondary | ICD-10-CM | POA: Diagnosis not present

## 2018-05-25 DIAGNOSIS — I2699 Other pulmonary embolism without acute cor pulmonale: Secondary | ICD-10-CM | POA: Diagnosis not present

## 2018-05-25 DIAGNOSIS — R531 Weakness: Secondary | ICD-10-CM | POA: Diagnosis not present

## 2018-05-25 DIAGNOSIS — R262 Difficulty in walking, not elsewhere classified: Secondary | ICD-10-CM | POA: Diagnosis not present

## 2018-05-25 NOTE — Patient Instructions (Signed)
Please schedule your next medicare wellness visit with me in 1 yr.  Eat heart healthy diet (full of fruits, vegetables, whole grains, lean protein, water--limit salt, fat, and sugar intake) and increase physical activity as tolerated.  Bring a copy of your living will and/or healthcare power of attorney to your next office visit.   [name] , Thank you for taking time to come for your Medicare Wellness Visit. I appreciate your ongoing commitment to your health goals. Please review the following plan we discussed and let me know if I can assist you in the future.   These are the goals we discussed: Goals    . Patient Stated     Would like for her memory to improve.       This is a list of the screening recommended for you and due dates:  Health Maintenance  Topic Date Due  .  Hepatitis C: One time screening is recommended by Center for Disease Control  (CDC) for  adults born from [birth year] through 1965.   18-Apr-1951  . Complete foot exam   08/12/1960  . Eye exam for diabetics  08/12/1960  . Pneumonia vaccines (1 of 2 - PCV13) 08/13/2015  . Tetanus Vaccine  07/29/2017  . Flu Shot  02/26/2018  . Hemoglobin A1C  04/08/2018  . Colon Cancer Screening  07/29/2018  . Mammogram  05/19/2020  . DEXA scan (bone density measurement)  Completed    Health Maintenance for Postmenopausal Women Menopause is a normal process in which your reproductive ability comes to an end. This process happens gradually over a span of months to years, usually between the ages of 57 and 58. Menopause is complete when you have missed 12 consecutive menstrual periods. It is important to talk with your health care provider about some of the most common conditions that affect postmenopausal women, such as heart disease, cancer, and bone loss (osteoporosis). Adopting a healthy lifestyle and getting preventive care can help to promote your health and wellness. Those actions can also lower your chances of developing some  of these common conditions. What should I know about menopause? During menopause, you may experience a number of symptoms, such as:  Moderate-to-severe hot flashes.  Night sweats.  Decrease in sex drive.  Mood swings.  Headaches.  Tiredness.  Irritability.  Memory problems.  Insomnia.  Choosing to treat or not to treat menopausal changes is an individual decision that you make with your health care provider. What should I know about hormone replacement therapy and supplements? Hormone therapy products are effective for treating symptoms that are associated with menopause, such as hot flashes and night sweats. Hormone replacement carries certain risks, especially as you become older. If you are thinking about using estrogen or estrogen with progestin treatments, discuss the benefits and risks with your health care provider. What should I know about heart disease and stroke? Heart disease, heart attack, and stroke become more likely as you age. This may be due, in part, to the hormonal changes that your body experiences during menopause. These can affect how your body processes dietary fats, triglycerides, and cholesterol. Heart attack and stroke are both medical emergencies. There are many things that you can do to help prevent heart disease and stroke:  Have your blood pressure checked at least every 1-2 years. High blood pressure causes heart disease and increases the risk of stroke.  If you are 70-80 years old, ask your health care provider if you should take aspirin to prevent a  heart attack or a stroke.  Do not use any tobacco products, including cigarettes, chewing tobacco, or electronic cigarettes. If you need help quitting, ask your health care provider.  It is important to eat a healthy diet and maintain a healthy weight. ? Be sure to include plenty of vegetables, fruits, low-fat dairy products, and lean protein. ? Avoid eating foods that are high in solid fats, added  sugars, or salt (sodium).  Get regular exercise. This is one of the most important things that you can do for your health. ? Try to exercise for at least 150 minutes each week. The type of exercise that you do should increase your heart rate and make you sweat. This is known as moderate-intensity exercise. ? Try to do strengthening exercises at least twice each week. Do these in addition to the moderate-intensity exercise.  Know your numbers.Ask your health care provider to check your cholesterol and your blood glucose. Continue to have your blood tested as directed by your health care provider.  What should I know about cancer screening? There are several types of cancer. Take the following steps to reduce your risk and to catch any cancer development as early as possible. Breast Cancer  Practice breast self-awareness. ? This means understanding how your breasts normally appear and feel. ? It also means doing regular breast self-exams. Let your health care provider know about any changes, no matter how small.  If you are 50 or older, have a clinician do a breast exam (clinical breast exam or CBE) every year. Depending on your age, family history, and medical history, it may be recommended that you also have a yearly breast X-ray (mammogram).  If you have a family history of breast cancer, talk with your health care provider about genetic screening.  If you are at high risk for breast cancer, talk with your health care provider about having an MRI and a mammogram every year.  Breast cancer (BRCA) gene test is recommended for women who have family members with BRCA-related cancers. Results of the assessment will determine the need for genetic counseling and BRCA1 and for BRCA2 testing. BRCA-related cancers include these types: ? Breast. This occurs in males or females. ? Ovarian. ? Tubal. This may also be called fallopian tube cancer. ? Cancer of the abdominal or pelvic lining (peritoneal  cancer). ? Prostate. ? Pancreatic.  Cervical, Uterine, and Ovarian Cancer Your health care provider may recommend that you be screened regularly for cancer of the pelvic organs. These include your ovaries, uterus, and vagina. This screening involves a pelvic exam, which includes checking for microscopic changes to the surface of your cervix (Pap test).  For women ages 21-65, health care providers may recommend a pelvic exam and a Pap test every three years. For women ages 25-65, they may recommend the Pap test and pelvic exam, combined with testing for human papilloma virus (HPV), every five years. Some types of HPV increase your risk of cervical cancer. Testing for HPV may also be done on women of any age who have unclear Pap test results.  Other health care providers may not recommend any screening for nonpregnant women who are considered low risk for pelvic cancer and have no symptoms. Ask your health care provider if a screening pelvic exam is right for you.  If you have had past treatment for cervical cancer or a condition that could lead to cancer, you need Pap tests and screening for cancer for at least 20 years after your treatment.  If Pap tests have been discontinued for you, your risk factors (such as having a new sexual partner) need to be reassessed to determine if you should start having screenings again. Some women have medical problems that increase the chance of getting cervical cancer. In these cases, your health care provider may recommend that you have screening and Pap tests more often.  If you have a family history of uterine cancer or ovarian cancer, talk with your health care provider about genetic screening.  If you have vaginal bleeding after reaching menopause, tell your health care provider.  There are currently no reliable tests available to screen for ovarian cancer.  Lung Cancer Lung cancer screening is recommended for adults 15-27 years old who are at high risk for  lung cancer because of a history of smoking. A yearly low-dose CT scan of the lungs is recommended if you:  Currently smoke.  Have a history of at least 30 pack-years of smoking and you currently smoke or have quit within the past 15 years. A pack-year is smoking an average of one pack of cigarettes per day for one year.  Yearly screening should:  Continue until it has been 15 years since you quit.  Stop if you develop a health problem that would prevent you from having lung cancer treatment.  Colorectal Cancer  This type of cancer can be detected and can often be prevented.  Routine colorectal cancer screening usually begins at age 37 and continues through age 57.  If you have risk factors for colon cancer, your health care provider may recommend that you be screened at an earlier age.  If you have a family history of colorectal cancer, talk with your health care provider about genetic screening.  Your health care provider may also recommend using home test kits to check for hidden blood in your stool.  A small camera at the end of a tube can be used to examine your colon directly (sigmoidoscopy or colonoscopy). This is done to check for the earliest forms of colorectal cancer.  Direct examination of the colon should be repeated every 5-10 years until age 24. However, if early forms of precancerous polyps or small growths are found or if you have a family history or genetic risk for colorectal cancer, you may need to be screened more often.  Skin Cancer  Check your skin from head to toe regularly.  Monitor any moles. Be sure to tell your health care provider: ? About any new moles or changes in moles, especially if there is a change in a mole's shape or color. ? If you have a mole that is larger than the size of a pencil eraser.  If any of your family members has a history of skin cancer, especially at a young age, talk with your health care provider about genetic  screening.  Always use sunscreen. Apply sunscreen liberally and repeatedly throughout the day.  Whenever you are outside, protect yourself by wearing long sleeves, pants, a wide-brimmed hat, and sunglasses.  What should I know about osteoporosis? Osteoporosis is a condition in which bone destruction happens more quickly than new bone creation. After menopause, you may be at an increased risk for osteoporosis. To help prevent osteoporosis or the bone fractures that can happen because of osteoporosis, the following is recommended:  If you are 24-45 years old, get at least 1,000 mg of calcium and at least 600 mg of vitamin D per day.  If you are older than age 38  but younger than age 40, get at least 1,200 mg of calcium and at least 600 mg of vitamin D per day.  If you are older than age 32, get at least 1,200 mg of calcium and at least 800 mg of vitamin D per day.  Smoking and excessive alcohol intake increase the risk of osteoporosis. Eat foods that are rich in calcium and vitamin D, and do weight-bearing exercises several times each week as directed by your health care provider. What should I know about how menopause affects my mental health? Depression may occur at any age, but it is more common as you become older. Common symptoms of depression include:  Low or sad mood.  Changes in sleep patterns.  Changes in appetite or eating patterns.  Feeling an overall lack of motivation or enjoyment of activities that you previously enjoyed.  Frequent crying spells.  Talk with your health care provider if you think that you are experiencing depression. What should I know about immunizations? It is important that you get and maintain your immunizations. These include:  Tetanus, diphtheria, and pertussis (Tdap) booster vaccine.  Influenza every year before the flu season begins.  Pneumonia vaccine.  Shingles vaccine.  Your health care provider may also recommend other  immunizations. This information is not intended to replace advice given to you by your health care provider. Make sure you discuss any questions you have with your health care provider. Document Released: 09/06/2005 Document Revised: 02/02/2016 Document Reviewed: 04/18/2015 Elsevier Interactive Patient Education  2018 [employer].

## 2018-05-27 ENCOUNTER — Ambulatory Visit (INDEPENDENT_AMBULATORY_CARE_PROVIDER_SITE_OTHER): Payer: Medicare HMO | Admitting: Psychology

## 2018-05-27 DIAGNOSIS — F4321 Adjustment disorder with depressed mood: Secondary | ICD-10-CM

## 2018-05-28 DIAGNOSIS — R531 Weakness: Secondary | ICD-10-CM | POA: Diagnosis not present

## 2018-05-28 DIAGNOSIS — R4701 Aphasia: Secondary | ICD-10-CM | POA: Diagnosis not present

## 2018-05-28 DIAGNOSIS — R262 Difficulty in walking, not elsewhere classified: Secondary | ICD-10-CM | POA: Diagnosis not present

## 2018-05-28 DIAGNOSIS — Z7409 Other reduced mobility: Secondary | ICD-10-CM | POA: Diagnosis not present

## 2018-05-28 DIAGNOSIS — I69351 Hemiplegia and hemiparesis following cerebral infarction affecting right dominant side: Secondary | ICD-10-CM | POA: Diagnosis not present

## 2018-05-28 DIAGNOSIS — Z9181 History of falling: Secondary | ICD-10-CM | POA: Diagnosis not present

## 2018-06-01 ENCOUNTER — Other Ambulatory Visit: Payer: Self-pay | Admitting: Family Medicine

## 2018-06-01 DIAGNOSIS — I639 Cerebral infarction, unspecified: Secondary | ICD-10-CM | POA: Diagnosis not present

## 2018-06-01 DIAGNOSIS — R531 Weakness: Secondary | ICD-10-CM | POA: Diagnosis not present

## 2018-06-01 DIAGNOSIS — I2699 Other pulmonary embolism without acute cor pulmonale: Secondary | ICD-10-CM | POA: Diagnosis not present

## 2018-06-01 DIAGNOSIS — I69351 Hemiplegia and hemiparesis following cerebral infarction affecting right dominant side: Secondary | ICD-10-CM | POA: Diagnosis not present

## 2018-06-01 DIAGNOSIS — R4701 Aphasia: Secondary | ICD-10-CM | POA: Diagnosis not present

## 2018-06-01 DIAGNOSIS — Z7409 Other reduced mobility: Secondary | ICD-10-CM | POA: Diagnosis not present

## 2018-06-01 DIAGNOSIS — R262 Difficulty in walking, not elsewhere classified: Secondary | ICD-10-CM | POA: Diagnosis not present

## 2018-06-02 DIAGNOSIS — I69320 Aphasia following cerebral infarction: Secondary | ICD-10-CM | POA: Diagnosis not present

## 2018-06-02 DIAGNOSIS — R2689 Other abnormalities of gait and mobility: Secondary | ICD-10-CM | POA: Diagnosis not present

## 2018-06-02 DIAGNOSIS — Z7409 Other reduced mobility: Secondary | ICD-10-CM | POA: Diagnosis not present

## 2018-06-02 DIAGNOSIS — I639 Cerebral infarction, unspecified: Secondary | ICD-10-CM | POA: Diagnosis not present

## 2018-06-03 DIAGNOSIS — I2699 Other pulmonary embolism without acute cor pulmonale: Secondary | ICD-10-CM | POA: Diagnosis not present

## 2018-06-03 DIAGNOSIS — R4701 Aphasia: Secondary | ICD-10-CM | POA: Diagnosis not present

## 2018-06-03 DIAGNOSIS — R531 Weakness: Secondary | ICD-10-CM | POA: Diagnosis not present

## 2018-06-03 DIAGNOSIS — Z7409 Other reduced mobility: Secondary | ICD-10-CM | POA: Diagnosis not present

## 2018-06-03 DIAGNOSIS — R262 Difficulty in walking, not elsewhere classified: Secondary | ICD-10-CM | POA: Diagnosis not present

## 2018-06-03 DIAGNOSIS — I639 Cerebral infarction, unspecified: Secondary | ICD-10-CM | POA: Diagnosis not present

## 2018-06-03 DIAGNOSIS — I69351 Hemiplegia and hemiparesis following cerebral infarction affecting right dominant side: Secondary | ICD-10-CM | POA: Diagnosis not present

## 2018-06-04 DIAGNOSIS — I69320 Aphasia following cerebral infarction: Secondary | ICD-10-CM | POA: Diagnosis not present

## 2018-06-04 DIAGNOSIS — I639 Cerebral infarction, unspecified: Secondary | ICD-10-CM | POA: Diagnosis not present

## 2018-06-04 DIAGNOSIS — R2689 Other abnormalities of gait and mobility: Secondary | ICD-10-CM | POA: Diagnosis not present

## 2018-06-04 DIAGNOSIS — Z7409 Other reduced mobility: Secondary | ICD-10-CM | POA: Diagnosis not present

## 2018-06-05 ENCOUNTER — Telehealth: Payer: Self-pay | Admitting: *Deleted

## 2018-06-05 DIAGNOSIS — R569 Unspecified convulsions: Secondary | ICD-10-CM | POA: Diagnosis not present

## 2018-06-05 DIAGNOSIS — I69398 Other sequelae of cerebral infarction: Secondary | ICD-10-CM | POA: Diagnosis not present

## 2018-06-05 DIAGNOSIS — M4800 Spinal stenosis, site unspecified: Secondary | ICD-10-CM | POA: Diagnosis not present

## 2018-06-05 NOTE — Telephone Encounter (Signed)
Daughter will bring paper by so we can fix the date.

## 2018-06-05 NOTE — Telephone Encounter (Signed)
Copied from CRM [id number]. Topic: General - Other >> Jun 04, 2018  4:51 PM Angela Nevin wrote: Patients daughter calling to request review of [employer] form dropped off earlier today at front desk - Question # 9 filled in incorrectly- actual date was Sep 05 2016. Needs to be corrected. Please contact patients daughter to let her know if she can come by office to get corrected form tomorrow. Please advise.   [phone]

## 2018-06-06 DIAGNOSIS — E538 Deficiency of other specified B group vitamins: Secondary | ICD-10-CM | POA: Diagnosis not present

## 2018-06-06 DIAGNOSIS — I69351 Hemiplegia and hemiparesis following cerebral infarction affecting right dominant side: Secondary | ICD-10-CM | POA: Diagnosis not present

## 2018-06-06 DIAGNOSIS — R569 Unspecified convulsions: Secondary | ICD-10-CM | POA: Diagnosis not present

## 2018-06-06 DIAGNOSIS — I69398 Other sequelae of cerebral infarction: Secondary | ICD-10-CM | POA: Diagnosis not present

## 2018-06-06 DIAGNOSIS — R4701 Aphasia: Secondary | ICD-10-CM | POA: Diagnosis not present

## 2018-06-06 DIAGNOSIS — N189 Chronic kidney disease, unspecified: Secondary | ICD-10-CM | POA: Diagnosis not present

## 2018-06-06 DIAGNOSIS — I639 Cerebral infarction, unspecified: Secondary | ICD-10-CM | POA: Diagnosis not present

## 2018-06-06 DIAGNOSIS — G40909 Epilepsy, unspecified, not intractable, without status epilepticus: Secondary | ICD-10-CM | POA: Diagnosis not present

## 2018-06-06 DIAGNOSIS — M4800 Spinal stenosis, site unspecified: Secondary | ICD-10-CM | POA: Diagnosis not present

## 2018-06-08 DIAGNOSIS — Z7409 Other reduced mobility: Secondary | ICD-10-CM | POA: Diagnosis not present

## 2018-06-08 DIAGNOSIS — I69320 Aphasia following cerebral infarction: Secondary | ICD-10-CM | POA: Diagnosis not present

## 2018-06-08 DIAGNOSIS — I69351 Hemiplegia and hemiparesis following cerebral infarction affecting right dominant side: Secondary | ICD-10-CM | POA: Diagnosis not present

## 2018-06-08 DIAGNOSIS — R2689 Other abnormalities of gait and mobility: Secondary | ICD-10-CM | POA: Diagnosis not present

## 2018-06-08 DIAGNOSIS — I639 Cerebral infarction, unspecified: Secondary | ICD-10-CM | POA: Diagnosis not present

## 2018-06-08 DIAGNOSIS — R531 Weakness: Secondary | ICD-10-CM | POA: Diagnosis not present

## 2018-06-08 DIAGNOSIS — R262 Difficulty in walking, not elsewhere classified: Secondary | ICD-10-CM | POA: Diagnosis not present

## 2018-06-10 DIAGNOSIS — R4701 Aphasia: Secondary | ICD-10-CM | POA: Diagnosis not present

## 2018-06-10 DIAGNOSIS — I2699 Other pulmonary embolism without acute cor pulmonale: Secondary | ICD-10-CM | POA: Diagnosis not present

## 2018-06-10 DIAGNOSIS — I69351 Hemiplegia and hemiparesis following cerebral infarction affecting right dominant side: Secondary | ICD-10-CM | POA: Diagnosis not present

## 2018-06-11 DIAGNOSIS — R262 Difficulty in walking, not elsewhere classified: Secondary | ICD-10-CM | POA: Diagnosis not present

## 2018-06-11 DIAGNOSIS — I639 Cerebral infarction, unspecified: Secondary | ICD-10-CM | POA: Diagnosis not present

## 2018-06-11 DIAGNOSIS — R531 Weakness: Secondary | ICD-10-CM | POA: Diagnosis not present

## 2018-06-11 DIAGNOSIS — R2689 Other abnormalities of gait and mobility: Secondary | ICD-10-CM | POA: Diagnosis not present

## 2018-06-11 DIAGNOSIS — I69320 Aphasia following cerebral infarction: Secondary | ICD-10-CM | POA: Diagnosis not present

## 2018-06-11 DIAGNOSIS — Z7409 Other reduced mobility: Secondary | ICD-10-CM | POA: Diagnosis not present

## 2018-06-11 DIAGNOSIS — I69351 Hemiplegia and hemiparesis following cerebral infarction affecting right dominant side: Secondary | ICD-10-CM | POA: Diagnosis not present

## 2018-06-15 DIAGNOSIS — I2699 Other pulmonary embolism without acute cor pulmonale: Secondary | ICD-10-CM | POA: Diagnosis not present

## 2018-06-15 DIAGNOSIS — I69351 Hemiplegia and hemiparesis following cerebral infarction affecting right dominant side: Secondary | ICD-10-CM | POA: Diagnosis not present

## 2018-06-15 DIAGNOSIS — I639 Cerebral infarction, unspecified: Secondary | ICD-10-CM | POA: Diagnosis not present

## 2018-06-15 DIAGNOSIS — I69320 Aphasia following cerebral infarction: Secondary | ICD-10-CM | POA: Diagnosis not present

## 2018-06-15 DIAGNOSIS — R2689 Other abnormalities of gait and mobility: Secondary | ICD-10-CM | POA: Diagnosis not present

## 2018-06-15 DIAGNOSIS — Z7409 Other reduced mobility: Secondary | ICD-10-CM | POA: Diagnosis not present

## 2018-06-15 DIAGNOSIS — R4701 Aphasia: Secondary | ICD-10-CM | POA: Diagnosis not present

## 2018-06-16 DIAGNOSIS — I69351 Hemiplegia and hemiparesis following cerebral infarction affecting right dominant side: Secondary | ICD-10-CM | POA: Diagnosis not present

## 2018-06-16 DIAGNOSIS — R531 Weakness: Secondary | ICD-10-CM | POA: Diagnosis not present

## 2018-06-16 DIAGNOSIS — I639 Cerebral infarction, unspecified: Secondary | ICD-10-CM | POA: Diagnosis not present

## 2018-06-16 DIAGNOSIS — R262 Difficulty in walking, not elsewhere classified: Secondary | ICD-10-CM | POA: Diagnosis not present

## 2018-06-16 DIAGNOSIS — Z7409 Other reduced mobility: Secondary | ICD-10-CM | POA: Diagnosis not present

## 2018-06-18 DIAGNOSIS — R531 Weakness: Secondary | ICD-10-CM | POA: Diagnosis not present

## 2018-06-18 DIAGNOSIS — I639 Cerebral infarction, unspecified: Secondary | ICD-10-CM | POA: Diagnosis not present

## 2018-06-18 DIAGNOSIS — I69351 Hemiplegia and hemiparesis following cerebral infarction affecting right dominant side: Secondary | ICD-10-CM | POA: Diagnosis not present

## 2018-06-18 DIAGNOSIS — Z7409 Other reduced mobility: Secondary | ICD-10-CM | POA: Diagnosis not present

## 2018-06-18 DIAGNOSIS — R262 Difficulty in walking, not elsewhere classified: Secondary | ICD-10-CM | POA: Diagnosis not present

## 2018-06-19 ENCOUNTER — Telehealth: Payer: Self-pay | Admitting: *Deleted

## 2018-06-19 NOTE — Telephone Encounter (Signed)
[name] brought in new [employer] forms to be Updated & completed for her period in the hospital from 08/10/17 through 09/05/17, completed as much as possible, reviewed with Apolonio SchneidersSheketia; forwarded to provider/SLS 11/22

## 2018-06-22 ENCOUNTER — Ambulatory Visit (INDEPENDENT_AMBULATORY_CARE_PROVIDER_SITE_OTHER): Payer: Medicare HMO | Admitting: Psychology

## 2018-06-22 DIAGNOSIS — F4321 Adjustment disorder with depressed mood: Secondary | ICD-10-CM

## 2018-06-24 DIAGNOSIS — I69351 Hemiplegia and hemiparesis following cerebral infarction affecting right dominant side: Secondary | ICD-10-CM | POA: Diagnosis not present

## 2018-06-24 DIAGNOSIS — R531 Weakness: Secondary | ICD-10-CM | POA: Diagnosis not present

## 2018-06-24 DIAGNOSIS — I639 Cerebral infarction, unspecified: Secondary | ICD-10-CM | POA: Diagnosis not present

## 2018-06-24 DIAGNOSIS — Z7409 Other reduced mobility: Secondary | ICD-10-CM | POA: Diagnosis not present

## 2018-06-24 DIAGNOSIS — R262 Difficulty in walking, not elsewhere classified: Secondary | ICD-10-CM | POA: Diagnosis not present

## 2018-06-24 DIAGNOSIS — I2699 Other pulmonary embolism without acute cor pulmonale: Secondary | ICD-10-CM | POA: Diagnosis not present

## 2018-06-24 DIAGNOSIS — R4701 Aphasia: Secondary | ICD-10-CM | POA: Diagnosis not present

## 2018-06-24 NOTE — Telephone Encounter (Signed)
I have not seen papers recently If I signed them I would have put them in folder for sharon

## 2018-06-29 DIAGNOSIS — Z7409 Other reduced mobility: Secondary | ICD-10-CM | POA: Diagnosis not present

## 2018-06-29 DIAGNOSIS — R262 Difficulty in walking, not elsewhere classified: Secondary | ICD-10-CM | POA: Diagnosis not present

## 2018-06-29 DIAGNOSIS — Z86711 Personal history of pulmonary embolism: Secondary | ICD-10-CM | POA: Diagnosis not present

## 2018-06-29 DIAGNOSIS — Z8673 Personal history of transient ischemic attack (TIA), and cerebral infarction without residual deficits: Secondary | ICD-10-CM | POA: Diagnosis not present

## 2018-06-29 DIAGNOSIS — R531 Weakness: Secondary | ICD-10-CM | POA: Diagnosis not present

## 2018-06-29 DIAGNOSIS — R4701 Aphasia: Secondary | ICD-10-CM | POA: Diagnosis not present

## 2018-06-29 DIAGNOSIS — I639 Cerebral infarction, unspecified: Secondary | ICD-10-CM | POA: Diagnosis not present

## 2018-06-29 DIAGNOSIS — I2699 Other pulmonary embolism without acute cor pulmonale: Secondary | ICD-10-CM | POA: Diagnosis not present

## 2018-06-29 DIAGNOSIS — R2689 Other abnormalities of gait and mobility: Secondary | ICD-10-CM | POA: Diagnosis not present

## 2018-06-29 DIAGNOSIS — I69351 Hemiplegia and hemiparesis following cerebral infarction affecting right dominant side: Secondary | ICD-10-CM | POA: Diagnosis not present

## 2018-06-29 NOTE — Telephone Encounter (Signed)
Re-routed to Jasmine DecemberSharon to provide update on ppwk and notify pt.

## 2018-06-29 NOTE — Telephone Encounter (Signed)
I have not received any paperwork for patient/SLS 12/02

## 2018-07-01 DIAGNOSIS — R4701 Aphasia: Secondary | ICD-10-CM | POA: Diagnosis not present

## 2018-07-01 DIAGNOSIS — I69351 Hemiplegia and hemiparesis following cerebral infarction affecting right dominant side: Secondary | ICD-10-CM | POA: Diagnosis not present

## 2018-07-01 DIAGNOSIS — Z8673 Personal history of transient ischemic attack (TIA), and cerebral infarction without residual deficits: Secondary | ICD-10-CM | POA: Diagnosis not present

## 2018-07-01 DIAGNOSIS — R2689 Other abnormalities of gait and mobility: Secondary | ICD-10-CM | POA: Diagnosis not present

## 2018-07-01 DIAGNOSIS — Z7409 Other reduced mobility: Secondary | ICD-10-CM | POA: Diagnosis not present

## 2018-07-01 DIAGNOSIS — I2699 Other pulmonary embolism without acute cor pulmonale: Secondary | ICD-10-CM | POA: Diagnosis not present

## 2018-07-02 ENCOUNTER — Telehealth: Payer: Self-pay | Admitting: *Deleted

## 2018-07-02 NOTE — Telephone Encounter (Signed)
[facility]; forwarded to provider/SLS 12/05

## 2018-07-05 DIAGNOSIS — M4800 Spinal stenosis, site unspecified: Secondary | ICD-10-CM | POA: Diagnosis not present

## 2018-07-05 DIAGNOSIS — I69398 Other sequelae of cerebral infarction: Secondary | ICD-10-CM | POA: Diagnosis not present

## 2018-07-05 DIAGNOSIS — R569 Unspecified convulsions: Secondary | ICD-10-CM | POA: Diagnosis not present

## 2018-07-06 DIAGNOSIS — I69398 Other sequelae of cerebral infarction: Secondary | ICD-10-CM | POA: Diagnosis not present

## 2018-07-06 DIAGNOSIS — R569 Unspecified convulsions: Secondary | ICD-10-CM | POA: Diagnosis not present

## 2018-07-06 DIAGNOSIS — E538 Deficiency of other specified B group vitamins: Secondary | ICD-10-CM | POA: Diagnosis not present

## 2018-07-06 DIAGNOSIS — M4800 Spinal stenosis, site unspecified: Secondary | ICD-10-CM | POA: Diagnosis not present

## 2018-07-06 DIAGNOSIS — N189 Chronic kidney disease, unspecified: Secondary | ICD-10-CM | POA: Diagnosis not present

## 2018-07-06 DIAGNOSIS — R4701 Aphasia: Secondary | ICD-10-CM | POA: Diagnosis not present

## 2018-07-06 DIAGNOSIS — I639 Cerebral infarction, unspecified: Secondary | ICD-10-CM | POA: Diagnosis not present

## 2018-07-06 DIAGNOSIS — G40909 Epilepsy, unspecified, not intractable, without status epilepticus: Secondary | ICD-10-CM | POA: Diagnosis not present

## 2018-07-06 DIAGNOSIS — I69351 Hemiplegia and hemiparesis following cerebral infarction affecting right dominant side: Secondary | ICD-10-CM | POA: Diagnosis not present

## 2018-07-06 NOTE — Telephone Encounter (Signed)
I have not seen any paperwork ----- Kathleen Jensen-- do you have it

## 2018-07-06 NOTE — Telephone Encounter (Signed)
Pt daughter wants to find out status of paper work. [facility] and in prior note sharon confirmed she filled out paper work and went over it with shekeita and sent to provider./ Please advise Pts daughter on the status Santiago Bumpers([name] franklin Cb# [phone]

## 2018-07-06 NOTE — Telephone Encounter (Signed)
Pt. calling, wanting update on ppwk status. Chartered loss adjusterAuthor confirmed with Apolonio SchneidersSheketia, CMA and Dr. Laury AxonLowne that they have not received any ppwk from [employer]. Author advised that pt. ask [employer] to re-fax the form(s) to #[phone], and [employer] agent verbalized understanding and agreed to relay.

## 2018-07-07 NOTE — Telephone Encounter (Signed)
Left message on machine to call back.  Form is not in media.  Will need a new form if daughter cannot wait til next week til Kathleen DecemberSharon (paperwork person gets back).  If daughter can get it in before 5 today I will fill out while in office, if not she can leave form for me to complete on Thursday when I return.

## 2018-07-08 DIAGNOSIS — Z7409 Other reduced mobility: Secondary | ICD-10-CM | POA: Diagnosis not present

## 2018-07-08 DIAGNOSIS — I69351 Hemiplegia and hemiparesis following cerebral infarction affecting right dominant side: Secondary | ICD-10-CM | POA: Diagnosis not present

## 2018-07-08 DIAGNOSIS — I2699 Other pulmonary embolism without acute cor pulmonale: Secondary | ICD-10-CM | POA: Diagnosis not present

## 2018-07-08 DIAGNOSIS — R2689 Other abnormalities of gait and mobility: Secondary | ICD-10-CM | POA: Diagnosis not present

## 2018-07-08 DIAGNOSIS — Z8673 Personal history of transient ischemic attack (TIA), and cerebral infarction without residual deficits: Secondary | ICD-10-CM | POA: Diagnosis not present

## 2018-07-08 DIAGNOSIS — R4701 Aphasia: Secondary | ICD-10-CM | POA: Diagnosis not present

## 2018-07-09 DIAGNOSIS — I69351 Hemiplegia and hemiparesis following cerebral infarction affecting right dominant side: Secondary | ICD-10-CM | POA: Diagnosis not present

## 2018-07-09 DIAGNOSIS — R4701 Aphasia: Secondary | ICD-10-CM | POA: Diagnosis not present

## 2018-07-09 DIAGNOSIS — I2699 Other pulmonary embolism without acute cor pulmonale: Secondary | ICD-10-CM | POA: Diagnosis not present

## 2018-07-10 NOTE — Telephone Encounter (Signed)
Pt's daughter dropped off document to be filled out by provider (1 page Kathleen Jensen- pt sent also example of how to fill out document but with right updates - also some extra copies if original get ruin) Pt's daughter stated that document [facility] Document when ready to call at [phone]. Document given to providers CMA.

## 2018-07-13 DIAGNOSIS — R4701 Aphasia: Secondary | ICD-10-CM | POA: Diagnosis not present

## 2018-07-13 DIAGNOSIS — I69351 Hemiplegia and hemiparesis following cerebral infarction affecting right dominant side: Secondary | ICD-10-CM | POA: Diagnosis not present

## 2018-07-13 DIAGNOSIS — I2699 Other pulmonary embolism without acute cor pulmonale: Secondary | ICD-10-CM | POA: Diagnosis not present

## 2018-07-13 DIAGNOSIS — R2689 Other abnormalities of gait and mobility: Secondary | ICD-10-CM | POA: Diagnosis not present

## 2018-07-13 DIAGNOSIS — Z8673 Personal history of transient ischemic attack (TIA), and cerebral infarction without residual deficits: Secondary | ICD-10-CM | POA: Diagnosis not present

## 2018-07-13 DIAGNOSIS — Z7409 Other reduced mobility: Secondary | ICD-10-CM | POA: Diagnosis not present

## 2018-07-13 NOTE — Telephone Encounter (Signed)
Form is ready for pickup and is up front in pickup drawer.

## 2018-07-15 ENCOUNTER — Telehealth: Payer: Self-pay | Admitting: *Deleted

## 2018-07-15 DIAGNOSIS — Z7409 Other reduced mobility: Secondary | ICD-10-CM | POA: Diagnosis not present

## 2018-07-15 DIAGNOSIS — R531 Weakness: Secondary | ICD-10-CM | POA: Diagnosis not present

## 2018-07-15 DIAGNOSIS — Z86711 Personal history of pulmonary embolism: Secondary | ICD-10-CM | POA: Diagnosis not present

## 2018-07-15 DIAGNOSIS — R262 Difficulty in walking, not elsewhere classified: Secondary | ICD-10-CM | POA: Diagnosis not present

## 2018-07-15 DIAGNOSIS — I69351 Hemiplegia and hemiparesis following cerebral infarction affecting right dominant side: Secondary | ICD-10-CM | POA: Diagnosis not present

## 2018-07-15 DIAGNOSIS — I639 Cerebral infarction, unspecified: Secondary | ICD-10-CM | POA: Diagnosis not present

## 2018-07-15 NOTE — Telephone Encounter (Signed)
[facility] Certification; forwarded to provider/SLS 12/18

## 2018-07-16 DIAGNOSIS — Z86711 Personal history of pulmonary embolism: Secondary | ICD-10-CM | POA: Diagnosis not present

## 2018-07-16 DIAGNOSIS — I69351 Hemiplegia and hemiparesis following cerebral infarction affecting right dominant side: Secondary | ICD-10-CM | POA: Diagnosis not present

## 2018-07-16 DIAGNOSIS — Z7409 Other reduced mobility: Secondary | ICD-10-CM | POA: Diagnosis not present

## 2018-07-16 DIAGNOSIS — R2689 Other abnormalities of gait and mobility: Secondary | ICD-10-CM | POA: Diagnosis not present

## 2018-07-16 DIAGNOSIS — R531 Weakness: Secondary | ICD-10-CM | POA: Diagnosis not present

## 2018-07-16 DIAGNOSIS — Z8673 Personal history of transient ischemic attack (TIA), and cerebral infarction without residual deficits: Secondary | ICD-10-CM | POA: Diagnosis not present

## 2018-07-16 DIAGNOSIS — I639 Cerebral infarction, unspecified: Secondary | ICD-10-CM | POA: Diagnosis not present

## 2018-07-16 DIAGNOSIS — R262 Difficulty in walking, not elsewhere classified: Secondary | ICD-10-CM | POA: Diagnosis not present

## 2018-07-20 DIAGNOSIS — I69351 Hemiplegia and hemiparesis following cerebral infarction affecting right dominant side: Secondary | ICD-10-CM | POA: Diagnosis not present

## 2018-07-20 DIAGNOSIS — Z7409 Other reduced mobility: Secondary | ICD-10-CM | POA: Diagnosis not present

## 2018-07-20 DIAGNOSIS — I639 Cerebral infarction, unspecified: Secondary | ICD-10-CM | POA: Diagnosis not present

## 2018-07-20 DIAGNOSIS — R262 Difficulty in walking, not elsewhere classified: Secondary | ICD-10-CM | POA: Diagnosis not present

## 2018-07-20 DIAGNOSIS — R531 Weakness: Secondary | ICD-10-CM | POA: Diagnosis not present

## 2018-07-20 DIAGNOSIS — Z86711 Personal history of pulmonary embolism: Secondary | ICD-10-CM | POA: Diagnosis not present

## 2018-07-21 DIAGNOSIS — Z86711 Personal history of pulmonary embolism: Secondary | ICD-10-CM | POA: Diagnosis not present

## 2018-07-21 DIAGNOSIS — Z7409 Other reduced mobility: Secondary | ICD-10-CM | POA: Diagnosis not present

## 2018-07-21 DIAGNOSIS — I69351 Hemiplegia and hemiparesis following cerebral infarction affecting right dominant side: Secondary | ICD-10-CM | POA: Diagnosis not present

## 2018-07-21 DIAGNOSIS — R262 Difficulty in walking, not elsewhere classified: Secondary | ICD-10-CM | POA: Diagnosis not present

## 2018-07-21 DIAGNOSIS — I639 Cerebral infarction, unspecified: Secondary | ICD-10-CM | POA: Diagnosis not present

## 2018-07-21 DIAGNOSIS — R531 Weakness: Secondary | ICD-10-CM | POA: Diagnosis not present

## 2018-07-27 ENCOUNTER — Ambulatory Visit: Payer: Medicare HMO | Admitting: Family Medicine

## 2018-07-27 DIAGNOSIS — R4701 Aphasia: Secondary | ICD-10-CM | POA: Diagnosis not present

## 2018-07-27 DIAGNOSIS — R262 Difficulty in walking, not elsewhere classified: Secondary | ICD-10-CM | POA: Diagnosis not present

## 2018-07-27 DIAGNOSIS — R531 Weakness: Secondary | ICD-10-CM | POA: Diagnosis not present

## 2018-07-27 DIAGNOSIS — Z86711 Personal history of pulmonary embolism: Secondary | ICD-10-CM | POA: Diagnosis not present

## 2018-07-27 DIAGNOSIS — I69351 Hemiplegia and hemiparesis following cerebral infarction affecting right dominant side: Secondary | ICD-10-CM | POA: Diagnosis not present

## 2018-07-27 DIAGNOSIS — Z7409 Other reduced mobility: Secondary | ICD-10-CM | POA: Diagnosis not present

## 2018-07-27 DIAGNOSIS — I639 Cerebral infarction, unspecified: Secondary | ICD-10-CM | POA: Diagnosis not present

## 2018-07-27 DIAGNOSIS — I2699 Other pulmonary embolism without acute cor pulmonale: Secondary | ICD-10-CM | POA: Diagnosis not present

## 2018-07-28 DIAGNOSIS — R531 Weakness: Secondary | ICD-10-CM | POA: Diagnosis not present

## 2018-07-28 DIAGNOSIS — Z7409 Other reduced mobility: Secondary | ICD-10-CM | POA: Diagnosis not present

## 2018-07-28 DIAGNOSIS — R262 Difficulty in walking, not elsewhere classified: Secondary | ICD-10-CM | POA: Diagnosis not present

## 2018-07-28 DIAGNOSIS — I639 Cerebral infarction, unspecified: Secondary | ICD-10-CM | POA: Diagnosis not present

## 2018-07-28 DIAGNOSIS — I69351 Hemiplegia and hemiparesis following cerebral infarction affecting right dominant side: Secondary | ICD-10-CM | POA: Diagnosis not present

## 2018-07-28 DIAGNOSIS — Z86711 Personal history of pulmonary embolism: Secondary | ICD-10-CM | POA: Diagnosis not present

## 2018-07-30 ENCOUNTER — Ambulatory Visit (INDEPENDENT_AMBULATORY_CARE_PROVIDER_SITE_OTHER): Payer: Medicare HMO | Admitting: Family Medicine

## 2018-07-30 ENCOUNTER — Encounter: Payer: Self-pay | Admitting: Family Medicine

## 2018-07-30 VITALS — BP 126/70 | HR 60 | Temp 98.3°F | Resp 16 | Ht 64.0 in | Wt 217.4 lb

## 2018-07-30 DIAGNOSIS — G40909 Epilepsy, unspecified, not intractable, without status epilepticus: Secondary | ICD-10-CM | POA: Diagnosis not present

## 2018-07-30 DIAGNOSIS — E1139 Type 2 diabetes mellitus with other diabetic ophthalmic complication: Secondary | ICD-10-CM

## 2018-07-30 DIAGNOSIS — E785 Hyperlipidemia, unspecified: Secondary | ICD-10-CM

## 2018-07-30 DIAGNOSIS — I2699 Other pulmonary embolism without acute cor pulmonale: Secondary | ICD-10-CM | POA: Diagnosis not present

## 2018-07-30 DIAGNOSIS — IMO0002 Reserved for concepts with insufficient information to code with codable children: Secondary | ICD-10-CM

## 2018-07-30 DIAGNOSIS — Z23 Encounter for immunization: Secondary | ICD-10-CM

## 2018-07-30 DIAGNOSIS — E1169 Type 2 diabetes mellitus with other specified complication: Secondary | ICD-10-CM | POA: Insufficient documentation

## 2018-07-30 DIAGNOSIS — E1165 Type 2 diabetes mellitus with hyperglycemia: Secondary | ICD-10-CM

## 2018-07-30 DIAGNOSIS — E538 Deficiency of other specified B group vitamins: Secondary | ICD-10-CM | POA: Insufficient documentation

## 2018-07-30 DIAGNOSIS — Z7409 Other reduced mobility: Secondary | ICD-10-CM | POA: Diagnosis not present

## 2018-07-30 DIAGNOSIS — I10 Essential (primary) hypertension: Secondary | ICD-10-CM | POA: Diagnosis not present

## 2018-07-30 DIAGNOSIS — E039 Hypothyroidism, unspecified: Secondary | ICD-10-CM

## 2018-07-30 DIAGNOSIS — Z8673 Personal history of transient ischemic attack (TIA), and cerebral infarction without residual deficits: Secondary | ICD-10-CM | POA: Insufficient documentation

## 2018-07-30 DIAGNOSIS — K219 Gastro-esophageal reflux disease without esophagitis: Secondary | ICD-10-CM

## 2018-07-30 DIAGNOSIS — I69351 Hemiplegia and hemiparesis following cerebral infarction affecting right dominant side: Secondary | ICD-10-CM | POA: Diagnosis not present

## 2018-07-30 DIAGNOSIS — Z86711 Personal history of pulmonary embolism: Secondary | ICD-10-CM | POA: Diagnosis not present

## 2018-07-30 LAB — COMPREHENSIVE METABOLIC PANEL
ALBUMIN: 3.8 g/dL (ref 3.5–5.2)
ALT: 73 U/L — ABNORMAL HIGH (ref 0–35)
AST: 77 U/L — ABNORMAL HIGH (ref 0–37)
Alkaline Phosphatase: 173 U/L — ABNORMAL HIGH (ref 39–117)
BILIRUBIN TOTAL: 0.3 mg/dL (ref 0.2–1.2)
BUN: 14 mg/dL (ref 6–23)
CALCIUM: 8.9 mg/dL (ref 8.4–10.5)
CO2: 28 mEq/L (ref 19–32)
Chloride: 103 mEq/L (ref 96–112)
Creatinine, Ser: 0.74 mg/dL (ref 0.40–1.20)
GFR: 100.38 mL/min (ref >60.00)
Glucose, Bld: 72 mg/dL (ref 70–99)
Potassium: 3.8 mEq/L (ref 3.5–5.1)
Sodium: 139 mEq/L (ref 135–145)
Total Protein: 7.5 g/dL (ref 6.0–8.3)

## 2018-07-30 LAB — CBC WITH DIFFERENTIAL/PLATELET
BASOS ABS: 0.1 10*3/uL (ref 0.0–0.1)
Basophils Relative: 1 % (ref 0.0–3.0)
Eosinophils Absolute: 0.2 10*3/uL (ref 0.0–0.7)
Eosinophils Relative: 3.7 % (ref 0.0–5.0)
HCT: 36.5 % (ref 36.0–46.0)
Hemoglobin: 11.8 g/dL — ABNORMAL LOW (ref 12.0–15.0)
Lymphocytes Relative: 44.2 % (ref 12.0–46.0)
Lymphs Abs: 2.5 10*3/uL (ref 0.7–4.0)
MCHC: 32.3 g/dL (ref 30.0–36.0)
MCV: 84.4 fl (ref 78.0–100.0)
Monocytes Absolute: 0.3 10*3/uL (ref 0.1–1.0)
Monocytes Relative: 5.3 % (ref 3.0–12.0)
Neutro Abs: 2.6 10*3/uL (ref 1.4–7.7)
Neutrophils Relative %: 45.8 % (ref 43.0–77.0)
Platelets: 219 10*3/uL (ref 150.0–400.0)
RBC: 4.32 Mil/uL (ref 3.87–5.11)
RDW: 14.6 % (ref 11.5–15.5)
WBC: 5.6 10*3/uL (ref 4.0–10.5)

## 2018-07-30 LAB — LIPID PANEL
Cholesterol: 172 mg/dL (ref 0–200)
HDL: 61.2 mg/dL (ref >39.00)
LDL Cholesterol: 90 mg/dL (ref 0–99)
NonHDL: 110.55
Total CHOL/HDL Ratio: 3
Triglycerides: 101 mg/dL (ref 0.0–149.0)
VLDL: 20.2 mg/dL (ref 0.0–40.0)

## 2018-07-30 LAB — HEMOGLOBIN A1C: Hgb A1c MFr Bld: 5.8 % (ref 4.6–6.5)

## 2018-07-30 LAB — VITAMIN B12: Vitamin B-12: >1500 pg/mL — ABNORMAL HIGH (ref 211–911)

## 2018-07-30 LAB — TSH: TSH: 2.09 u[IU]/mL (ref 0.35–4.50)

## 2018-07-30 NOTE — Assessment & Plan Note (Signed)
Off meds since bypass surgery Check labs today

## 2018-07-30 NOTE — Assessment & Plan Note (Signed)
Well controlled, no changes to meds. Encouraged heart healthy diet such as the DASH diet and exercise as tolerated.  °

## 2018-07-30 NOTE — Assessment & Plan Note (Signed)
stable °

## 2018-07-30 NOTE — Assessment & Plan Note (Signed)
History of No new symptoms  

## 2018-07-30 NOTE — Patient Instructions (Signed)
Living With Traumatic Brain Injury Traumatic brain injury (TBI) is an injury to the brain that may be mild, moderate, or severe. Symptoms of any type of TBI can be long lasting (chronic). Depending on the area of the brain that is affected, a TBI can interfere with vision, memory, concentration, speech, balance, sense of touch, and sleep. TBI can also cause chronic symptoms like headache or dizziness. How to cope with lifestyle changes After a TBI, you may need to make changes to your lifestyle in order to recover as well as possible. How quickly and how fully you recover will depend on the severity of your injury. Your recovery plan may involve:  Working with specialists to develop a rehabilitation plan to help you return to your regular activities. Your health care team may include: ? Physical or occupational therapists. ? Speech and language pathologists. ? Mental health counselors. ? Physicians like your primary care physician or neurologist.  Taking time off work or school, depending on your injury.  Avoiding situations where there is a risk for another head injury, such as football, hockey, soccer, basketball, martial arts, downhill snow sports, and horseback riding. Do not do these activities until your health care provider approves.  Resting. Rest helps the brain to heal. Make sure you: ? Get plenty of sleep at night. Avoid staying up late at night. ? Keep the same bedtime hours on weekends and weekdays. ? Rest during the day. Take daytime naps or rest breaks when you feel tired.  Avoiding extra stress on your eyes. You may need to set time limits when working on the computer, watching TV, and reading.  Finding ways to manage stress. This may include: ? Avoiding activities that cause stress. ? Deep breathing, yoga, or meditation. ? Listening to music or spending time outdoors.  Making lists, setting reminders, or using a day planner to help your memory.  Allowing yourself plenty  of time to complete everyday tasks, such as grocery shopping, paying bills, and doing laundry.  Avoiding driving. Your ability to drive safely may be affected by your injury. ? Rely on family, friends, or a transportation service to help you get around and to appointments. ? Have a professional evaluation to check your driving ability. ? Access support services to help you return to driving. These may include training and adaptive equipment. Follow these instructions at home:  Take over-the-counter and prescription medicines only as told by your health care provider. Do not take aspirin or other anti-inflammatory medicines such as ibuprofen or naproxen unless approved by your health care provider.  Avoid large amounts of caffeine. Your body may be more sensitive to it after your injury.  Do not use any products that contain nicotine or tobacco, such as cigarettes, e-cigarettes, nicotine gum, and patches. If you need help quitting, ask your health care provider.  Do not use drugs.  Limit alcohol intake to no more than 1 drink per day for nonpregnant women and 2 drinks per day for men. One drink equals 12 ounces of beer, 5 ounces of wine, or 1 ounces of hard liquor.  Do not drive until cleared by your health care provider.  Keep all follow-up visits as told by your health care provider. This is important. Where to find support  Talk with your employer, co-workers, teachers, or school counselor about your injury. Work together to develop a plan for completing tasks while you recover.  Talk to others living with a TBI. Join a support group with other people  who have experienced a TBI.  Let your friends and family members know what they can do to help. This might include helping at home or transportation to appointments.  If you are unable to continue working after your injury, talk to a Child psychotherapist about options to help you meet your financial needs.  Seek out additional resources if  you are a Building services engineer or family member, such as: ? Development worker, international aid Injury Center: dvbic.dcoe.mil ? Department of [employer]: 7828845570 Questions to ask your health care provider:  How serious is my injury?  What is my rehabilitation plan?  What is my expected recovery?  When can I return to work or school?  When can I return to regular activities, including driving? Contact a health care provider if:  You have new or worsening: ? Dizziness. ? Headache. ? Anxiety or depression. ? Irritability. ? Confusion. ? Jerky movements that you cannot control (seizures). ? Extreme sensitivity to light or sound. ? Nausea or vomiting. Summary  Traumatic brain injury (TBI) is an injury to your brain that can interfere with vision, memory, concentration, speech, balance, sense of touch, and sleep. TBI can also cause chronic symptoms like headache or dizziness.  After a TBI you may need to make several changes to your lifestyle in order to recover as well as possible. How quickly and how fully you recover will depend on the severity of your injury.  Talk to your family, friends, employer, co-workers, Architectural technologist, or school counselor about your injury. Work together to develop a plan for completing tasks while you recover. This information is not intended to replace advice given to you by your health care provider. Make sure you discuss any questions you have with your health care provider. Document Released: 07/11/2016 Document Revised: 07/11/2016 Document Reviewed: 07/11/2016 Elsevier Interactive Patient Education  2019 [employer].

## 2018-07-30 NOTE — Assessment & Plan Note (Signed)
F/u neuro  con't meds

## 2018-07-30 NOTE — Assessment & Plan Note (Signed)
FL 2 filled out and faxed to [facility] con't PT/ OT  Pt needs total care

## 2018-07-30 NOTE — Assessment & Plan Note (Signed)
Check labs today.

## 2018-07-30 NOTE — Progress Notes (Signed)
[name], female    DOB: July 09, 1951  Age: 68 y.o. MRN: [id number]    Subjective:  Subjective  HPI Kathleen Jensen presents for f/u and labs.  Her daughter and caretaker are with her.  She needs 24 h care and is here to have FL 2 filled out as well.  No new complaints.    Review of Systems  Constitutional: Negative for activity change, appetite change, fatigue and unexpected weight change.  Respiratory: Negative for cough and shortness of breath.   Cardiovascular: Negative for chest pain and palpitations.  Musculoskeletal: Positive for gait problem.  Neurological: Positive for seizures, speech difficulty and weakness.  Psychiatric/Behavioral: Positive for confusion. Negative for behavioral problems and dysphoric mood. The patient is not nervous/anxious.     History Past Medical History:  Diagnosis Date  . Arthritis   . 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 Hypertension   . Seizures (HCC)   . Stroke [facility])     She has a past surgical history that includes Tonsillectomy; Gastric bypass (03/2007); Cervical spine surgery; Cesarean section; Breast biopsy (Right); Breast excisional biopsy; and Back surgery.   Her family history includes Cancer in her brother and sister; Diabetes in her father and mother; High blood pressure in her mother; Stroke in her father.She reports that she quit smoking about 39 years ago. She quit after 4.00 years of use. She has never used smokeless tobacco. She reports that she does not drink alcohol or use drugs.  Current Outpatient Medications on File Prior to Visit  Medication Sig Dispense Refill  . acetaminophen (TYLENOL) 325 MG tablet Take 1 tablet by mouth every 8 (eight) hours as needed.    Marland Kitchen albuterol (PROVENTIL HFA;VENTOLIN HFA) 108 (90 Base) MCG/ACT inhaler Inhale 2 puffs into the lungs every 6 (six) hours as needed for wheezing or shortness of breath. 1 Inhaler 0  . calcium carbonate (OS-CAL  - DOSED IN MG OF ELEMENTAL CALCIUM) 1250 (500 Ca) MG tablet 1 tablet daily.    Marland Kitchen gabapentin (NEURONTIN) 300 MG capsule Take 1 capsule by mouth 3 (three) times daily.    . hydrochlorothiazide (MICROZIDE) 12.5 MG capsule TAKE 1 CAPSULE (12.5 MG TOTAL) BY MOUTH DAILY. 90 capsule 0  . levETIRAcetam (KEPPRA) 1000 MG tablet 2 po bid 120 tablet 2  . levothyroxine (SYNTHROID, LEVOTHROID) 88 MCG tablet Take 1 tablet (88 mcg total) by mouth daily before breakfast. 90 tablet 3  . losartan (COZAAR) 100 MG tablet Take 1 tablet (100 mg total) by mouth daily. 15 tablet 0  . magnesium oxide (MAG-OX) 400 MG tablet Take 400 mg by mouth 2 (two) times daily.    . mirtazapine (REMERON) 30 MG tablet Take 1 tablet (30 mg total) by mouth at bedtime. 90 tablet 1  . Misc. Devices (TRANSFER BOARD) MISC 1 each by Does not apply route as needed (For Bed Transfer). 1 each 0  . pantoprazole (PROTONIX) 40 MG tablet Take 1 tablet (40 mg total) by mouth daily. 90 tablet 3  . phenytoin (DILANTIN) 50 MG tablet 200mg  in the morning and 200mg  in the evening    . polyethylene glycol (MIRALAX / GLYCOLAX) packet Take 17 g by mouth daily as needed.     . senna-docusate (SENOKOT-S) 8.6-50 MG tablet Take 1 tablet by mouth 2 (two) times daily.    Marland Kitchen tiZANidine (ZANAFLEX) 4 MG tablet Take 1 tablet (4 mg total) by mouth every 6 (six) hours as needed  for muscle spasms. 30 tablet 1  . valACYclovir (VALTREX) 1000 MG tablet Take 1 tablet (1,000 mg total) by mouth 3 (three) times daily. 21 tablet 0  . warfarin (COUMADIN) 5 MG tablet 10 mg on mon and 5 mg all other days (Patient taking differently: TAKE AS DIRECTED BY COUMADIN CLINIC) 45 tablet 3   No current facility-administered medications on file prior to visit.      Objective:  Objective  Physical Exam Vitals signs and nursing note reviewed.  Constitutional:      Appearance: She is well-developed.  HENT:     Head: Normocephalic and atraumatic.  Eyes:     Conjunctiva/sclera:  Conjunctivae normal.  Neck:     Musculoskeletal: Normal range of motion and neck supple.     Thyroid: No thyromegaly.     Vascular: No carotid bruit or JVD.  Cardiovascular:     Rate and Rhythm: Normal rate and regular rhythm.     Heart sounds: Normal heart sounds. No murmur.  Pulmonary:     Effort: Pulmonary effort is normal. No respiratory distress.     Breath sounds: Normal breath sounds. No wheezing or rales.  Chest:     Chest wall: No tenderness.  Neurological:     Mental Status: She is alert and oriented to person, place, and time.     Comments: Answers most questions appropriately today.  Daughter has to write some down for her to understand.  She does not always understand verbal statements.  Pt still in PT/ OT     BP 126/70 (BP Location: Left Arm, Cuff Size: Normal)   Pulse 60   Temp 98.3 F (36.8 C) (Oral)   Resp 16   Ht 5\' 4"  (1.626 m)   Wt 217 lb 6.4 oz (98.6 kg)   SpO2 94%   BMI 37.32 kg/m  Wt Readings from Last 3 Encounters:  07/30/18 217 lb 6.4 oz (98.6 kg)  05/25/18 202 lb (91.6 kg)  03/13/18 216 lb 6.4 oz (98.2 kg)     Lab Results  Component Value Date   WBC 4.3 12/08/2017   HGB 11.9 (L) 12/08/2017   HCT 35.3 (L) 12/08/2017   PLT 209.0 12/08/2017   GLUCOSE 64 (L) 02/25/2018   CHOL 147 10/06/2017   TRIG 64 10/06/2017   HDL 58 10/06/2017   LDLCALC 76 10/06/2017   ALT 11 (L) 10/15/2017   AST 17 10/15/2017   NA 139 02/25/2018   K 3.9 02/25/2018   CL 105 02/25/2018   CREATININE 0.73 02/25/2018   BUN 21 02/25/2018   CO2 30 02/25/2018   TSH 1.16 01/12/2018   INR 1.9 (H) 03/03/2018   HGBA1C 5.0 10/06/2017   MICROALBUR 1.0 01/26/2016    Mm 3d Screen Breast Bilateral  Result Date: 05/19/2018 CLINICAL DATA:  Screening. EXAM: DIGITAL SCREENING BILATERAL MAMMOGRAM WITH TOMO AND CAD COMPARISON:  Previous exam(s). ACR Breast Density Category b: There are scattered areas of fibroglandular density. FINDINGS: There are no findings suspicious for  malignancy. Images were processed with CAD. IMPRESSION: No mammographic evidence of malignancy. A result letter of this screening mammogram will be mailed directly to the patient. RECOMMENDATION: Screening mammogram in one year. (Code:SM-B-01Y) BI-RADS CATEGORY  1: Negative. Electronically Signed   By: Amie Portlandavid  Ormond M.D.   On: 05/19/2018 15:15     Assessment & Plan:  Plan  I am having Kathleen Jensen maintain her acetaminophen, calcium carbonate, polyethylene glycol, senna-docusate, magnesium oxide, warfarin, levETIRAcetam, levothyroxine, pantoprazole, gabapentin, phenytoin, valACYclovir, tiZANidine,  albuterol, losartan, mirtazapine, Transfer Board, and hydrochlorothiazide.  No orders of the defined types were placed in this encounter.   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 Encouraged heart healthy diet such as the DASH diet and exercise as tolerated.       Relevant Orders   CBC with Differential/Platelet   Lipid panel   Hemoglobin A1c   Comprehensive metabolic panel   Vitamin B12   TSH   GERD    stable      History of cerebral infarction    FL 2 filled out and faxed to [facility] con't PT/ OT  Pt needs total care      Hyperlipidemia associated with type 2 diabetes mellitus (HCC)   Relevant Orders   Lipid panel   Comprehensive metabolic panel   Hyperlipidemia LDL goal <70    Tolerating statin, encouraged heart healthy diet, avoid trans fats, minimize simple carbs and saturated fats. Increase exercise as tolerated      Hypothyroidism    Check labs today      Relevant Orders   TSH   Pulmonary embolism and infarction [facility])    History  of No new symptoms      Seizure disorder (HCC)    F/u neuro  con't meds      Vitamin B 12 deficiency    Check labs today      Relevant Orders   CBC with Differential/Platelet   Vitamin B12      Follow-up: Return in about 6 months (around 01/28/2019), or if symptoms worsen or fail to improve.  Donato Schultz, DO

## 2018-07-30 NOTE — Assessment & Plan Note (Signed)
Tolerating statin, encouraged heart healthy diet, avoid trans fats, minimize simple carbs and saturated fats. Increase exercise as tolerated 

## 2018-07-30 NOTE — Addendum Note (Signed)
Addended by: Thelma BargeICHARDSON, Rea Reser D on: 07/30/2018 01:14 PM   Modules accepted: Orders

## 2018-08-03 DIAGNOSIS — Z86711 Personal history of pulmonary embolism: Secondary | ICD-10-CM | POA: Diagnosis not present

## 2018-08-03 DIAGNOSIS — I2699 Other pulmonary embolism without acute cor pulmonale: Secondary | ICD-10-CM | POA: Diagnosis not present

## 2018-08-03 DIAGNOSIS — Z7409 Other reduced mobility: Secondary | ICD-10-CM | POA: Diagnosis not present

## 2018-08-03 DIAGNOSIS — I69351 Hemiplegia and hemiparesis following cerebral infarction affecting right dominant side: Secondary | ICD-10-CM | POA: Diagnosis not present

## 2018-08-03 DIAGNOSIS — R4701 Aphasia: Secondary | ICD-10-CM | POA: Diagnosis not present

## 2018-08-05 ENCOUNTER — Ambulatory Visit (INDEPENDENT_AMBULATORY_CARE_PROVIDER_SITE_OTHER): Payer: Medicare HMO | Admitting: Psychology

## 2018-08-05 DIAGNOSIS — Z86711 Personal history of pulmonary embolism: Secondary | ICD-10-CM | POA: Diagnosis not present

## 2018-08-05 DIAGNOSIS — I69398 Other sequelae of cerebral infarction: Secondary | ICD-10-CM | POA: Diagnosis not present

## 2018-08-05 DIAGNOSIS — Z7409 Other reduced mobility: Secondary | ICD-10-CM | POA: Diagnosis not present

## 2018-08-05 DIAGNOSIS — F4321 Adjustment disorder with depressed mood: Secondary | ICD-10-CM | POA: Diagnosis not present

## 2018-08-05 DIAGNOSIS — I69351 Hemiplegia and hemiparesis following cerebral infarction affecting right dominant side: Secondary | ICD-10-CM | POA: Diagnosis not present

## 2018-08-05 DIAGNOSIS — M4800 Spinal stenosis, site unspecified: Secondary | ICD-10-CM | POA: Diagnosis not present

## 2018-08-05 DIAGNOSIS — R569 Unspecified convulsions: Secondary | ICD-10-CM | POA: Diagnosis not present

## 2018-08-05 DIAGNOSIS — I2699 Other pulmonary embolism without acute cor pulmonale: Secondary | ICD-10-CM | POA: Diagnosis not present

## 2018-08-05 DIAGNOSIS — R4701 Aphasia: Secondary | ICD-10-CM | POA: Diagnosis not present

## 2018-08-06 DIAGNOSIS — R52 Pain, unspecified: Secondary | ICD-10-CM | POA: Diagnosis not present

## 2018-08-06 DIAGNOSIS — K5909 Other constipation: Secondary | ICD-10-CM | POA: Diagnosis not present

## 2018-08-06 DIAGNOSIS — B9620 Unspecified Escherichia coli [E. coli] as the cause of diseases classified elsewhere: Secondary | ICD-10-CM | POA: Diagnosis not present

## 2018-08-06 DIAGNOSIS — N189 Chronic kidney disease, unspecified: Secondary | ICD-10-CM | POA: Diagnosis not present

## 2018-08-06 DIAGNOSIS — N200 Calculus of kidney: Secondary | ICD-10-CM | POA: Diagnosis not present

## 2018-08-06 DIAGNOSIS — I69351 Hemiplegia and hemiparesis following cerebral infarction affecting right dominant side: Secondary | ICD-10-CM | POA: Diagnosis not present

## 2018-08-06 DIAGNOSIS — G40909 Epilepsy, unspecified, not intractable, without status epilepticus: Secondary | ICD-10-CM | POA: Diagnosis not present

## 2018-08-06 DIAGNOSIS — E538 Deficiency of other specified B group vitamins: Secondary | ICD-10-CM | POA: Diagnosis not present

## 2018-08-06 DIAGNOSIS — I639 Cerebral infarction, unspecified: Secondary | ICD-10-CM | POA: Diagnosis not present

## 2018-08-06 DIAGNOSIS — R4701 Aphasia: Secondary | ICD-10-CM | POA: Diagnosis not present

## 2018-08-06 DIAGNOSIS — R0989 Other specified symptoms and signs involving the circulatory and respiratory systems: Secondary | ICD-10-CM | POA: Diagnosis not present

## 2018-08-06 DIAGNOSIS — R197 Diarrhea, unspecified: Secondary | ICD-10-CM | POA: Diagnosis not present

## 2018-08-06 DIAGNOSIS — R569 Unspecified convulsions: Secondary | ICD-10-CM | POA: Diagnosis not present

## 2018-08-06 DIAGNOSIS — M4800 Spinal stenosis, site unspecified: Secondary | ICD-10-CM | POA: Diagnosis not present

## 2018-08-06 DIAGNOSIS — R1084 Generalized abdominal pain: Secondary | ICD-10-CM | POA: Diagnosis not present

## 2018-08-06 DIAGNOSIS — I2699 Other pulmonary embolism without acute cor pulmonale: Secondary | ICD-10-CM | POA: Diagnosis not present

## 2018-08-06 DIAGNOSIS — I69398 Other sequelae of cerebral infarction: Secondary | ICD-10-CM | POA: Diagnosis not present

## 2018-08-06 DIAGNOSIS — N390 Urinary tract infection, site not specified: Secondary | ICD-10-CM | POA: Diagnosis not present

## 2018-08-06 DIAGNOSIS — R1011 Right upper quadrant pain: Secondary | ICD-10-CM | POA: Diagnosis not present

## 2018-08-07 DIAGNOSIS — M2550 Pain in unspecified joint: Secondary | ICD-10-CM | POA: Diagnosis not present

## 2018-08-07 DIAGNOSIS — Z7401 Bed confinement status: Secondary | ICD-10-CM | POA: Diagnosis not present

## 2018-08-07 DIAGNOSIS — N200 Calculus of kidney: Secondary | ICD-10-CM | POA: Diagnosis not present

## 2018-08-07 DIAGNOSIS — R531 Weakness: Secondary | ICD-10-CM | POA: Diagnosis not present

## 2018-08-10 ENCOUNTER — Encounter: Payer: Self-pay | Admitting: *Deleted

## 2018-08-10 ENCOUNTER — Other Ambulatory Visit: Payer: Self-pay | Admitting: *Deleted

## 2018-08-10 DIAGNOSIS — R945 Abnormal results of liver function studies: Principal | ICD-10-CM

## 2018-08-10 DIAGNOSIS — R7989 Other specified abnormal findings of blood chemistry: Secondary | ICD-10-CM

## 2018-08-17 DIAGNOSIS — R41841 Cognitive communication deficit: Secondary | ICD-10-CM | POA: Diagnosis not present

## 2018-08-17 DIAGNOSIS — I680 Cerebral amyloid angiopathy: Secondary | ICD-10-CM | POA: Diagnosis not present

## 2018-08-17 DIAGNOSIS — R2681 Unsteadiness on feet: Secondary | ICD-10-CM | POA: Diagnosis not present

## 2018-08-17 DIAGNOSIS — M6281 Muscle weakness (generalized): Secondary | ICD-10-CM | POA: Diagnosis not present

## 2018-08-17 DIAGNOSIS — Z79899 Other long term (current) drug therapy: Secondary | ICD-10-CM | POA: Diagnosis not present

## 2018-08-17 DIAGNOSIS — I69951 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: Secondary | ICD-10-CM | POA: Diagnosis not present

## 2018-08-17 DIAGNOSIS — R278 Other lack of coordination: Secondary | ICD-10-CM | POA: Diagnosis not present

## 2018-08-18 DIAGNOSIS — I618 Other nontraumatic intracerebral hemorrhage: Secondary | ICD-10-CM | POA: Diagnosis not present

## 2018-08-18 DIAGNOSIS — G4089 Other seizures: Secondary | ICD-10-CM | POA: Diagnosis not present

## 2018-08-18 DIAGNOSIS — I680 Cerebral amyloid angiopathy: Secondary | ICD-10-CM | POA: Diagnosis not present

## 2018-08-18 DIAGNOSIS — F3489 Other specified persistent mood disorders: Secondary | ICD-10-CM | POA: Diagnosis not present

## 2018-08-21 DIAGNOSIS — M6281 Muscle weakness (generalized): Secondary | ICD-10-CM | POA: Diagnosis not present

## 2018-08-21 DIAGNOSIS — D6489 Other specified anemias: Secondary | ICD-10-CM | POA: Diagnosis not present

## 2018-08-21 DIAGNOSIS — G4089 Other seizures: Secondary | ICD-10-CM | POA: Diagnosis not present

## 2018-08-21 DIAGNOSIS — R278 Other lack of coordination: Secondary | ICD-10-CM | POA: Diagnosis not present

## 2018-08-21 DIAGNOSIS — I680 Cerebral amyloid angiopathy: Secondary | ICD-10-CM | POA: Diagnosis not present

## 2018-08-21 DIAGNOSIS — I69951 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: Secondary | ICD-10-CM | POA: Diagnosis not present

## 2018-08-21 DIAGNOSIS — R2681 Unsteadiness on feet: Secondary | ICD-10-CM | POA: Diagnosis not present

## 2018-08-21 DIAGNOSIS — E038 Other specified hypothyroidism: Secondary | ICD-10-CM | POA: Diagnosis not present

## 2018-08-21 DIAGNOSIS — R41841 Cognitive communication deficit: Secondary | ICD-10-CM | POA: Diagnosis not present

## 2018-08-22 ENCOUNTER — Ambulatory Visit (HOSPITAL_BASED_OUTPATIENT_CLINIC_OR_DEPARTMENT_OTHER): Payer: Medicare HMO

## 2018-08-22 DIAGNOSIS — R278 Other lack of coordination: Secondary | ICD-10-CM | POA: Diagnosis not present

## 2018-08-22 DIAGNOSIS — R41841 Cognitive communication deficit: Secondary | ICD-10-CM | POA: Diagnosis not present

## 2018-08-22 DIAGNOSIS — I680 Cerebral amyloid angiopathy: Secondary | ICD-10-CM | POA: Diagnosis not present

## 2018-08-22 DIAGNOSIS — R2681 Unsteadiness on feet: Secondary | ICD-10-CM | POA: Diagnosis not present

## 2018-08-22 DIAGNOSIS — M6281 Muscle weakness (generalized): Secondary | ICD-10-CM | POA: Diagnosis not present

## 2018-08-22 DIAGNOSIS — I69951 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: Secondary | ICD-10-CM | POA: Diagnosis not present

## 2018-08-24 DIAGNOSIS — I69951 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: Secondary | ICD-10-CM | POA: Diagnosis not present

## 2018-08-24 DIAGNOSIS — R278 Other lack of coordination: Secondary | ICD-10-CM | POA: Diagnosis not present

## 2018-08-24 DIAGNOSIS — I10 Essential (primary) hypertension: Secondary | ICD-10-CM | POA: Diagnosis not present

## 2018-08-24 DIAGNOSIS — R41841 Cognitive communication deficit: Secondary | ICD-10-CM | POA: Diagnosis not present

## 2018-08-24 DIAGNOSIS — I680 Cerebral amyloid angiopathy: Secondary | ICD-10-CM | POA: Diagnosis not present

## 2018-08-24 DIAGNOSIS — E876 Hypokalemia: Secondary | ICD-10-CM | POA: Diagnosis not present

## 2018-08-24 DIAGNOSIS — R2681 Unsteadiness on feet: Secondary | ICD-10-CM | POA: Diagnosis not present

## 2018-08-24 DIAGNOSIS — M6281 Muscle weakness (generalized): Secondary | ICD-10-CM | POA: Diagnosis not present

## 2018-08-24 DIAGNOSIS — Z79899 Other long term (current) drug therapy: Secondary | ICD-10-CM | POA: Diagnosis not present

## 2018-08-25 DIAGNOSIS — D508 Other iron deficiency anemias: Secondary | ICD-10-CM | POA: Diagnosis not present

## 2018-08-25 DIAGNOSIS — R41841 Cognitive communication deficit: Secondary | ICD-10-CM | POA: Diagnosis not present

## 2018-08-25 DIAGNOSIS — M6281 Muscle weakness (generalized): Secondary | ICD-10-CM | POA: Diagnosis not present

## 2018-08-25 DIAGNOSIS — I680 Cerebral amyloid angiopathy: Secondary | ICD-10-CM | POA: Diagnosis not present

## 2018-08-25 DIAGNOSIS — I69951 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: Secondary | ICD-10-CM | POA: Diagnosis not present

## 2018-08-25 DIAGNOSIS — R278 Other lack of coordination: Secondary | ICD-10-CM | POA: Diagnosis not present

## 2018-08-25 DIAGNOSIS — R2681 Unsteadiness on feet: Secondary | ICD-10-CM | POA: Diagnosis not present

## 2018-08-26 DIAGNOSIS — I69951 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: Secondary | ICD-10-CM | POA: Diagnosis not present

## 2018-08-26 DIAGNOSIS — R41841 Cognitive communication deficit: Secondary | ICD-10-CM | POA: Diagnosis not present

## 2018-08-26 DIAGNOSIS — I680 Cerebral amyloid angiopathy: Secondary | ICD-10-CM | POA: Diagnosis not present

## 2018-08-26 DIAGNOSIS — R2681 Unsteadiness on feet: Secondary | ICD-10-CM | POA: Diagnosis not present

## 2018-08-26 DIAGNOSIS — M6281 Muscle weakness (generalized): Secondary | ICD-10-CM | POA: Diagnosis not present

## 2018-08-26 DIAGNOSIS — R278 Other lack of coordination: Secondary | ICD-10-CM | POA: Diagnosis not present

## 2018-08-27 ENCOUNTER — Other Ambulatory Visit: Payer: Self-pay | Admitting: Family Medicine

## 2018-08-27 ENCOUNTER — Encounter (HOSPITAL_BASED_OUTPATIENT_CLINIC_OR_DEPARTMENT_OTHER): Payer: Self-pay | Admitting: Radiology

## 2018-08-27 ENCOUNTER — Ambulatory Visit (HOSPITAL_BASED_OUTPATIENT_CLINIC_OR_DEPARTMENT_OTHER)
Admission: RE | Admit: 2018-08-27 | Discharge: 2018-08-27 | Disposition: A | Discharge status: Home / Self Care | Payer: Medicare HMO | Source: Ambulatory Visit | Attending: Family Medicine | Admitting: Family Medicine

## 2018-08-27 DIAGNOSIS — M6281 Muscle weakness (generalized): Secondary | ICD-10-CM | POA: Diagnosis not present

## 2018-08-27 DIAGNOSIS — R278 Other lack of coordination: Secondary | ICD-10-CM | POA: Diagnosis not present

## 2018-08-27 DIAGNOSIS — R945 Abnormal results of liver function studies: Secondary | ICD-10-CM | POA: Diagnosis not present

## 2018-08-27 DIAGNOSIS — R7989 Other specified abnormal findings of blood chemistry: Secondary | ICD-10-CM

## 2018-08-27 DIAGNOSIS — R2681 Unsteadiness on feet: Secondary | ICD-10-CM | POA: Diagnosis not present

## 2018-08-27 DIAGNOSIS — N281 Cyst of kidney, acquired: Secondary | ICD-10-CM

## 2018-08-27 DIAGNOSIS — I680 Cerebral amyloid angiopathy: Secondary | ICD-10-CM | POA: Diagnosis not present

## 2018-08-27 DIAGNOSIS — I69951 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: Secondary | ICD-10-CM | POA: Diagnosis not present

## 2018-08-27 DIAGNOSIS — R41841 Cognitive communication deficit: Secondary | ICD-10-CM | POA: Diagnosis not present

## 2018-08-28 DIAGNOSIS — I680 Cerebral amyloid angiopathy: Secondary | ICD-10-CM | POA: Diagnosis not present

## 2018-08-28 DIAGNOSIS — R41841 Cognitive communication deficit: Secondary | ICD-10-CM | POA: Diagnosis not present

## 2018-08-28 DIAGNOSIS — R2681 Unsteadiness on feet: Secondary | ICD-10-CM | POA: Diagnosis not present

## 2018-08-28 DIAGNOSIS — I69951 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: Secondary | ICD-10-CM | POA: Diagnosis not present

## 2018-08-28 DIAGNOSIS — R278 Other lack of coordination: Secondary | ICD-10-CM | POA: Diagnosis not present

## 2018-08-28 DIAGNOSIS — M6281 Muscle weakness (generalized): Secondary | ICD-10-CM | POA: Diagnosis not present

## 2018-08-31 DIAGNOSIS — M6281 Muscle weakness (generalized): Secondary | ICD-10-CM | POA: Diagnosis not present

## 2018-08-31 DIAGNOSIS — R2681 Unsteadiness on feet: Secondary | ICD-10-CM | POA: Diagnosis not present

## 2018-08-31 DIAGNOSIS — R278 Other lack of coordination: Secondary | ICD-10-CM | POA: Diagnosis not present

## 2018-08-31 DIAGNOSIS — R41841 Cognitive communication deficit: Secondary | ICD-10-CM | POA: Diagnosis not present

## 2018-08-31 DIAGNOSIS — I680 Cerebral amyloid angiopathy: Secondary | ICD-10-CM | POA: Diagnosis not present

## 2018-08-31 DIAGNOSIS — I69951 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: Secondary | ICD-10-CM | POA: Diagnosis not present

## 2018-09-01 DIAGNOSIS — R2681 Unsteadiness on feet: Secondary | ICD-10-CM | POA: Diagnosis not present

## 2018-09-01 DIAGNOSIS — R41841 Cognitive communication deficit: Secondary | ICD-10-CM | POA: Diagnosis not present

## 2018-09-01 DIAGNOSIS — M6281 Muscle weakness (generalized): Secondary | ICD-10-CM | POA: Diagnosis not present

## 2018-09-01 DIAGNOSIS — R278 Other lack of coordination: Secondary | ICD-10-CM | POA: Diagnosis not present

## 2018-09-01 DIAGNOSIS — I69951 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: Secondary | ICD-10-CM | POA: Diagnosis not present

## 2018-09-01 DIAGNOSIS — I680 Cerebral amyloid angiopathy: Secondary | ICD-10-CM | POA: Diagnosis not present

## 2018-09-02 ENCOUNTER — Ambulatory Visit: Payer: Self-pay | Admitting: Psychology

## 2018-09-02 DIAGNOSIS — R41841 Cognitive communication deficit: Secondary | ICD-10-CM | POA: Diagnosis not present

## 2018-09-02 DIAGNOSIS — R2681 Unsteadiness on feet: Secondary | ICD-10-CM | POA: Diagnosis not present

## 2018-09-02 DIAGNOSIS — I69951 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: Secondary | ICD-10-CM | POA: Diagnosis not present

## 2018-09-02 DIAGNOSIS — R278 Other lack of coordination: Secondary | ICD-10-CM | POA: Diagnosis not present

## 2018-09-02 DIAGNOSIS — M6281 Muscle weakness (generalized): Secondary | ICD-10-CM | POA: Diagnosis not present

## 2018-09-02 DIAGNOSIS — I680 Cerebral amyloid angiopathy: Secondary | ICD-10-CM | POA: Diagnosis not present

## 2018-09-03 ENCOUNTER — Telehealth: Payer: Self-pay | Admitting: *Deleted

## 2018-09-03 DIAGNOSIS — R278 Other lack of coordination: Secondary | ICD-10-CM | POA: Diagnosis not present

## 2018-09-03 DIAGNOSIS — R41841 Cognitive communication deficit: Secondary | ICD-10-CM | POA: Diagnosis not present

## 2018-09-03 DIAGNOSIS — R2681 Unsteadiness on feet: Secondary | ICD-10-CM | POA: Diagnosis not present

## 2018-09-03 DIAGNOSIS — M6281 Muscle weakness (generalized): Secondary | ICD-10-CM | POA: Diagnosis not present

## 2018-09-03 DIAGNOSIS — I69951 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: Secondary | ICD-10-CM | POA: Diagnosis not present

## 2018-09-03 DIAGNOSIS — I680 Cerebral amyloid angiopathy: Secondary | ICD-10-CM | POA: Diagnosis not present

## 2018-09-03 NOTE — Telephone Encounter (Signed)
Central scheduling will contact pt today to schedule

## 2018-09-03 NOTE — Telephone Encounter (Signed)
Left message on machine that she will get a call today.

## 2018-09-03 NOTE — Telephone Encounter (Signed)
We put in a referral for MRI on 08/27/18 and it looks like it was authorized.  Daughter has not received call about getting it scheduled.  Can you check the status on it?

## 2018-09-04 DIAGNOSIS — M6281 Muscle weakness (generalized): Secondary | ICD-10-CM | POA: Diagnosis not present

## 2018-09-04 DIAGNOSIS — R2681 Unsteadiness on feet: Secondary | ICD-10-CM | POA: Diagnosis not present

## 2018-09-04 DIAGNOSIS — R41841 Cognitive communication deficit: Secondary | ICD-10-CM | POA: Diagnosis not present

## 2018-09-04 DIAGNOSIS — I6389 Other cerebral infarction: Secondary | ICD-10-CM | POA: Diagnosis not present

## 2018-09-04 DIAGNOSIS — I69951 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: Secondary | ICD-10-CM | POA: Diagnosis not present

## 2018-09-04 DIAGNOSIS — R1319 Other dysphagia: Secondary | ICD-10-CM | POA: Diagnosis not present

## 2018-09-04 DIAGNOSIS — G4089 Other seizures: Secondary | ICD-10-CM | POA: Diagnosis not present

## 2018-09-04 DIAGNOSIS — I10 Essential (primary) hypertension: Secondary | ICD-10-CM | POA: Diagnosis not present

## 2018-09-04 DIAGNOSIS — R278 Other lack of coordination: Secondary | ICD-10-CM | POA: Diagnosis not present

## 2018-09-04 DIAGNOSIS — I680 Cerebral amyloid angiopathy: Secondary | ICD-10-CM | POA: Diagnosis not present

## 2018-09-05 DIAGNOSIS — R41841 Cognitive communication deficit: Secondary | ICD-10-CM | POA: Diagnosis not present

## 2018-09-05 DIAGNOSIS — R2681 Unsteadiness on feet: Secondary | ICD-10-CM | POA: Diagnosis not present

## 2018-09-05 DIAGNOSIS — I680 Cerebral amyloid angiopathy: Secondary | ICD-10-CM | POA: Diagnosis not present

## 2018-09-05 DIAGNOSIS — I69951 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: Secondary | ICD-10-CM | POA: Diagnosis not present

## 2018-09-05 DIAGNOSIS — I69398 Other sequelae of cerebral infarction: Secondary | ICD-10-CM | POA: Diagnosis not present

## 2018-09-05 DIAGNOSIS — M6281 Muscle weakness (generalized): Secondary | ICD-10-CM | POA: Diagnosis not present

## 2018-09-05 DIAGNOSIS — R569 Unspecified convulsions: Secondary | ICD-10-CM | POA: Diagnosis not present

## 2018-09-05 DIAGNOSIS — R278 Other lack of coordination: Secondary | ICD-10-CM | POA: Diagnosis not present

## 2018-09-05 DIAGNOSIS — M4800 Spinal stenosis, site unspecified: Secondary | ICD-10-CM | POA: Diagnosis not present

## 2018-09-06 DIAGNOSIS — R4701 Aphasia: Secondary | ICD-10-CM | POA: Diagnosis not present

## 2018-09-06 DIAGNOSIS — G40909 Epilepsy, unspecified, not intractable, without status epilepticus: Secondary | ICD-10-CM | POA: Diagnosis not present

## 2018-09-06 DIAGNOSIS — I69351 Hemiplegia and hemiparesis following cerebral infarction affecting right dominant side: Secondary | ICD-10-CM | POA: Diagnosis not present

## 2018-09-06 DIAGNOSIS — R569 Unspecified convulsions: Secondary | ICD-10-CM | POA: Diagnosis not present

## 2018-09-06 DIAGNOSIS — E538 Deficiency of other specified B group vitamins: Secondary | ICD-10-CM | POA: Diagnosis not present

## 2018-09-06 DIAGNOSIS — M4800 Spinal stenosis, site unspecified: Secondary | ICD-10-CM | POA: Diagnosis not present

## 2018-09-06 DIAGNOSIS — N189 Chronic kidney disease, unspecified: Secondary | ICD-10-CM | POA: Diagnosis not present

## 2018-09-06 DIAGNOSIS — I639 Cerebral infarction, unspecified: Secondary | ICD-10-CM | POA: Diagnosis not present

## 2018-09-06 DIAGNOSIS — I69398 Other sequelae of cerebral infarction: Secondary | ICD-10-CM | POA: Diagnosis not present

## 2018-09-07 DIAGNOSIS — I680 Cerebral amyloid angiopathy: Secondary | ICD-10-CM | POA: Diagnosis not present

## 2018-09-07 DIAGNOSIS — R278 Other lack of coordination: Secondary | ICD-10-CM | POA: Diagnosis not present

## 2018-09-07 DIAGNOSIS — I69951 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: Secondary | ICD-10-CM | POA: Diagnosis not present

## 2018-09-07 DIAGNOSIS — R41841 Cognitive communication deficit: Secondary | ICD-10-CM | POA: Diagnosis not present

## 2018-09-07 DIAGNOSIS — M6281 Muscle weakness (generalized): Secondary | ICD-10-CM | POA: Diagnosis not present

## 2018-09-07 DIAGNOSIS — R2681 Unsteadiness on feet: Secondary | ICD-10-CM | POA: Diagnosis not present

## 2018-09-08 DIAGNOSIS — I680 Cerebral amyloid angiopathy: Secondary | ICD-10-CM | POA: Diagnosis not present

## 2018-09-08 DIAGNOSIS — R2681 Unsteadiness on feet: Secondary | ICD-10-CM | POA: Diagnosis not present

## 2018-09-08 DIAGNOSIS — M6281 Muscle weakness (generalized): Secondary | ICD-10-CM | POA: Diagnosis not present

## 2018-09-08 DIAGNOSIS — R278 Other lack of coordination: Secondary | ICD-10-CM | POA: Diagnosis not present

## 2018-09-08 DIAGNOSIS — I69951 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: Secondary | ICD-10-CM | POA: Diagnosis not present

## 2018-09-08 DIAGNOSIS — R41841 Cognitive communication deficit: Secondary | ICD-10-CM | POA: Diagnosis not present

## 2018-09-09 DIAGNOSIS — I680 Cerebral amyloid angiopathy: Secondary | ICD-10-CM | POA: Diagnosis not present

## 2018-09-09 DIAGNOSIS — R2681 Unsteadiness on feet: Secondary | ICD-10-CM | POA: Diagnosis not present

## 2018-09-09 DIAGNOSIS — I69951 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: Secondary | ICD-10-CM | POA: Diagnosis not present

## 2018-09-09 DIAGNOSIS — R278 Other lack of coordination: Secondary | ICD-10-CM | POA: Diagnosis not present

## 2018-09-09 DIAGNOSIS — R41841 Cognitive communication deficit: Secondary | ICD-10-CM | POA: Diagnosis not present

## 2018-09-09 DIAGNOSIS — M6281 Muscle weakness (generalized): Secondary | ICD-10-CM | POA: Diagnosis not present

## 2018-09-10 DIAGNOSIS — R41841 Cognitive communication deficit: Secondary | ICD-10-CM | POA: Diagnosis not present

## 2018-09-10 DIAGNOSIS — R278 Other lack of coordination: Secondary | ICD-10-CM | POA: Diagnosis not present

## 2018-09-10 DIAGNOSIS — I680 Cerebral amyloid angiopathy: Secondary | ICD-10-CM | POA: Diagnosis not present

## 2018-09-10 DIAGNOSIS — R2681 Unsteadiness on feet: Secondary | ICD-10-CM | POA: Diagnosis not present

## 2018-09-10 DIAGNOSIS — I69951 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: Secondary | ICD-10-CM | POA: Diagnosis not present

## 2018-09-10 DIAGNOSIS — M6281 Muscle weakness (generalized): Secondary | ICD-10-CM | POA: Diagnosis not present

## 2018-09-11 DIAGNOSIS — M6281 Muscle weakness (generalized): Secondary | ICD-10-CM | POA: Diagnosis not present

## 2018-09-11 DIAGNOSIS — R2681 Unsteadiness on feet: Secondary | ICD-10-CM | POA: Diagnosis not present

## 2018-09-11 DIAGNOSIS — I680 Cerebral amyloid angiopathy: Secondary | ICD-10-CM | POA: Diagnosis not present

## 2018-09-11 DIAGNOSIS — R278 Other lack of coordination: Secondary | ICD-10-CM | POA: Diagnosis not present

## 2018-09-11 DIAGNOSIS — R41841 Cognitive communication deficit: Secondary | ICD-10-CM | POA: Diagnosis not present

## 2018-09-11 DIAGNOSIS — I69951 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: Secondary | ICD-10-CM | POA: Diagnosis not present

## 2018-09-14 ENCOUNTER — Other Ambulatory Visit: Payer: Self-pay | Admitting: *Deleted

## 2018-09-14 DIAGNOSIS — Z01812 Encounter for preprocedural laboratory examination: Secondary | ICD-10-CM

## 2018-09-14 DIAGNOSIS — I680 Cerebral amyloid angiopathy: Secondary | ICD-10-CM | POA: Diagnosis not present

## 2018-09-14 DIAGNOSIS — R41841 Cognitive communication deficit: Secondary | ICD-10-CM | POA: Diagnosis not present

## 2018-09-14 DIAGNOSIS — I69951 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: Secondary | ICD-10-CM | POA: Diagnosis not present

## 2018-09-14 DIAGNOSIS — R278 Other lack of coordination: Secondary | ICD-10-CM | POA: Diagnosis not present

## 2018-09-14 DIAGNOSIS — R2681 Unsteadiness on feet: Secondary | ICD-10-CM | POA: Diagnosis not present

## 2018-09-14 DIAGNOSIS — M6281 Muscle weakness (generalized): Secondary | ICD-10-CM | POA: Diagnosis not present

## 2018-09-14 NOTE — Telephone Encounter (Signed)
Pt's daughter is calling in, she stated that no one has called them about scheduling MRI. Daughter would like a call back from [place]    CB: [phone]

## 2018-09-14 NOTE — Telephone Encounter (Signed)
Patient has an appt on 09/17/18 at 5pm at [facility].  Nothing to eat or drink after 1pm.  Patients daughter notified.

## 2018-09-14 NOTE — Telephone Encounter (Signed)
Can you check on this?  It looks like it was authorized?

## 2018-09-15 DIAGNOSIS — I680 Cerebral amyloid angiopathy: Secondary | ICD-10-CM | POA: Diagnosis not present

## 2018-09-15 DIAGNOSIS — R278 Other lack of coordination: Secondary | ICD-10-CM | POA: Diagnosis not present

## 2018-09-15 DIAGNOSIS — R2681 Unsteadiness on feet: Secondary | ICD-10-CM | POA: Diagnosis not present

## 2018-09-15 DIAGNOSIS — R41841 Cognitive communication deficit: Secondary | ICD-10-CM | POA: Diagnosis not present

## 2018-09-15 DIAGNOSIS — M6281 Muscle weakness (generalized): Secondary | ICD-10-CM | POA: Diagnosis not present

## 2018-09-15 DIAGNOSIS — I69951 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: Secondary | ICD-10-CM | POA: Diagnosis not present

## 2018-09-16 ENCOUNTER — Telehealth: Payer: Self-pay | Admitting: *Deleted

## 2018-09-16 DIAGNOSIS — M6281 Muscle weakness (generalized): Secondary | ICD-10-CM | POA: Diagnosis not present

## 2018-09-16 DIAGNOSIS — I680 Cerebral amyloid angiopathy: Secondary | ICD-10-CM | POA: Diagnosis not present

## 2018-09-16 DIAGNOSIS — I69951 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: Secondary | ICD-10-CM | POA: Diagnosis not present

## 2018-09-16 DIAGNOSIS — R278 Other lack of coordination: Secondary | ICD-10-CM | POA: Diagnosis not present

## 2018-09-16 DIAGNOSIS — R41841 Cognitive communication deficit: Secondary | ICD-10-CM | POA: Diagnosis not present

## 2018-09-16 DIAGNOSIS — R2681 Unsteadiness on feet: Secondary | ICD-10-CM | POA: Diagnosis not present

## 2018-09-16 NOTE — Telephone Encounter (Signed)
Copied from CRM [id number]. Topic: General - Other >> Sep 15, 2018  6:39 PM Neomia Dear, Efraim Kaufmann wrote: Reason for CRM: Daughter called and states that her mom is in a nursing home and wants to know if her lab appt can be rescheduled due to transportation needed for her from the home, this message is for [place] [facility] call back is [phone]

## 2018-09-17 ENCOUNTER — Ambulatory Visit (HOSPITAL_COMMUNITY): Payer: Medicare HMO

## 2018-09-17 DIAGNOSIS — M6281 Muscle weakness (generalized): Secondary | ICD-10-CM | POA: Diagnosis not present

## 2018-09-17 DIAGNOSIS — I680 Cerebral amyloid angiopathy: Secondary | ICD-10-CM | POA: Diagnosis not present

## 2018-09-17 DIAGNOSIS — R278 Other lack of coordination: Secondary | ICD-10-CM | POA: Diagnosis not present

## 2018-09-17 DIAGNOSIS — R2681 Unsteadiness on feet: Secondary | ICD-10-CM | POA: Diagnosis not present

## 2018-09-17 DIAGNOSIS — I69951 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: Secondary | ICD-10-CM | POA: Diagnosis not present

## 2018-09-17 DIAGNOSIS — R41841 Cognitive communication deficit: Secondary | ICD-10-CM | POA: Diagnosis not present

## 2018-09-17 NOTE — Telephone Encounter (Signed)
Spoke with daughter yesterday and gave her number to call to reschedule

## 2018-09-18 DIAGNOSIS — I680 Cerebral amyloid angiopathy: Secondary | ICD-10-CM | POA: Diagnosis not present

## 2018-09-18 DIAGNOSIS — M6281 Muscle weakness (generalized): Secondary | ICD-10-CM | POA: Diagnosis not present

## 2018-09-18 DIAGNOSIS — R278 Other lack of coordination: Secondary | ICD-10-CM | POA: Diagnosis not present

## 2018-09-18 DIAGNOSIS — R2681 Unsteadiness on feet: Secondary | ICD-10-CM | POA: Diagnosis not present

## 2018-09-18 DIAGNOSIS — R41841 Cognitive communication deficit: Secondary | ICD-10-CM | POA: Diagnosis not present

## 2018-09-18 DIAGNOSIS — I69951 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: Secondary | ICD-10-CM | POA: Diagnosis not present

## 2018-09-19 DIAGNOSIS — M6281 Muscle weakness (generalized): Secondary | ICD-10-CM | POA: Diagnosis not present

## 2018-09-19 DIAGNOSIS — R41841 Cognitive communication deficit: Secondary | ICD-10-CM | POA: Diagnosis not present

## 2018-09-19 DIAGNOSIS — I680 Cerebral amyloid angiopathy: Secondary | ICD-10-CM | POA: Diagnosis not present

## 2018-09-19 DIAGNOSIS — R2681 Unsteadiness on feet: Secondary | ICD-10-CM | POA: Diagnosis not present

## 2018-09-19 DIAGNOSIS — I69951 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: Secondary | ICD-10-CM | POA: Diagnosis not present

## 2018-09-19 DIAGNOSIS — R278 Other lack of coordination: Secondary | ICD-10-CM | POA: Diagnosis not present

## 2018-09-21 DIAGNOSIS — I680 Cerebral amyloid angiopathy: Secondary | ICD-10-CM | POA: Diagnosis not present

## 2018-09-21 DIAGNOSIS — R278 Other lack of coordination: Secondary | ICD-10-CM | POA: Diagnosis not present

## 2018-09-21 DIAGNOSIS — R2681 Unsteadiness on feet: Secondary | ICD-10-CM | POA: Diagnosis not present

## 2018-09-21 DIAGNOSIS — I69951 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: Secondary | ICD-10-CM | POA: Diagnosis not present

## 2018-09-21 DIAGNOSIS — R41841 Cognitive communication deficit: Secondary | ICD-10-CM | POA: Diagnosis not present

## 2018-09-21 DIAGNOSIS — M6281 Muscle weakness (generalized): Secondary | ICD-10-CM | POA: Diagnosis not present

## 2018-09-22 DIAGNOSIS — R2681 Unsteadiness on feet: Secondary | ICD-10-CM | POA: Diagnosis not present

## 2018-09-22 DIAGNOSIS — M6281 Muscle weakness (generalized): Secondary | ICD-10-CM | POA: Diagnosis not present

## 2018-09-22 DIAGNOSIS — I680 Cerebral amyloid angiopathy: Secondary | ICD-10-CM | POA: Diagnosis not present

## 2018-09-22 DIAGNOSIS — R278 Other lack of coordination: Secondary | ICD-10-CM | POA: Diagnosis not present

## 2018-09-22 DIAGNOSIS — I69951 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: Secondary | ICD-10-CM | POA: Diagnosis not present

## 2018-09-22 DIAGNOSIS — R41841 Cognitive communication deficit: Secondary | ICD-10-CM | POA: Diagnosis not present

## 2018-09-23 ENCOUNTER — Ambulatory Visit (HOSPITAL_COMMUNITY)
Admission: RE | Admit: 2018-09-23 | Discharge: 2018-09-23 | Disposition: A | Discharge status: Home / Self Care | Payer: Medicare HMO | Source: Ambulatory Visit | Attending: Family Medicine | Admitting: Family Medicine

## 2018-09-23 DIAGNOSIS — I680 Cerebral amyloid angiopathy: Secondary | ICD-10-CM | POA: Diagnosis not present

## 2018-09-23 DIAGNOSIS — R41841 Cognitive communication deficit: Secondary | ICD-10-CM | POA: Diagnosis not present

## 2018-09-23 DIAGNOSIS — N281 Cyst of kidney, acquired: Secondary | ICD-10-CM

## 2018-09-23 DIAGNOSIS — M6281 Muscle weakness (generalized): Secondary | ICD-10-CM | POA: Diagnosis not present

## 2018-09-23 DIAGNOSIS — I69951 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: Secondary | ICD-10-CM | POA: Diagnosis not present

## 2018-09-23 DIAGNOSIS — R278 Other lack of coordination: Secondary | ICD-10-CM | POA: Diagnosis not present

## 2018-09-23 DIAGNOSIS — R2681 Unsteadiness on feet: Secondary | ICD-10-CM | POA: Diagnosis not present

## 2018-09-23 NOTE — Progress Notes (Signed)
Patient arrived for MRI Abd wo/w   Patient is unable to answer MRI screening questions to ensure she is safe for MRI scan. Daughter was called and voicemail answer.  Spoke with Henriette Combs, she cannot answer patients medical history and stated that they would get her rescheduled with family member.  12:49

## 2018-09-24 DIAGNOSIS — R278 Other lack of coordination: Secondary | ICD-10-CM | POA: Diagnosis not present

## 2018-09-24 DIAGNOSIS — M6281 Muscle weakness (generalized): Secondary | ICD-10-CM | POA: Diagnosis not present

## 2018-09-24 DIAGNOSIS — I680 Cerebral amyloid angiopathy: Secondary | ICD-10-CM | POA: Diagnosis not present

## 2018-09-24 DIAGNOSIS — R2681 Unsteadiness on feet: Secondary | ICD-10-CM | POA: Diagnosis not present

## 2018-09-24 DIAGNOSIS — R41841 Cognitive communication deficit: Secondary | ICD-10-CM | POA: Diagnosis not present

## 2018-09-24 DIAGNOSIS — I69951 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: Secondary | ICD-10-CM | POA: Diagnosis not present

## 2018-09-25 DIAGNOSIS — I680 Cerebral amyloid angiopathy: Secondary | ICD-10-CM | POA: Diagnosis not present

## 2018-09-25 DIAGNOSIS — M6281 Muscle weakness (generalized): Secondary | ICD-10-CM | POA: Diagnosis not present

## 2018-09-25 DIAGNOSIS — R41841 Cognitive communication deficit: Secondary | ICD-10-CM | POA: Diagnosis not present

## 2018-09-25 DIAGNOSIS — R2681 Unsteadiness on feet: Secondary | ICD-10-CM | POA: Diagnosis not present

## 2018-09-25 DIAGNOSIS — I69951 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: Secondary | ICD-10-CM | POA: Diagnosis not present

## 2018-09-25 DIAGNOSIS — R278 Other lack of coordination: Secondary | ICD-10-CM | POA: Diagnosis not present

## 2018-09-26 DIAGNOSIS — I680 Cerebral amyloid angiopathy: Secondary | ICD-10-CM | POA: Diagnosis not present

## 2018-09-26 DIAGNOSIS — R278 Other lack of coordination: Secondary | ICD-10-CM | POA: Diagnosis not present

## 2018-09-26 DIAGNOSIS — R2681 Unsteadiness on feet: Secondary | ICD-10-CM | POA: Diagnosis not present

## 2018-09-26 DIAGNOSIS — I69951 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: Secondary | ICD-10-CM | POA: Diagnosis not present

## 2018-09-26 DIAGNOSIS — M6281 Muscle weakness (generalized): Secondary | ICD-10-CM | POA: Diagnosis not present

## 2018-09-26 DIAGNOSIS — R41841 Cognitive communication deficit: Secondary | ICD-10-CM | POA: Diagnosis not present

## 2018-09-28 DIAGNOSIS — I680 Cerebral amyloid angiopathy: Secondary | ICD-10-CM | POA: Diagnosis not present

## 2018-09-28 DIAGNOSIS — R41841 Cognitive communication deficit: Secondary | ICD-10-CM | POA: Diagnosis not present

## 2018-09-28 DIAGNOSIS — R278 Other lack of coordination: Secondary | ICD-10-CM | POA: Diagnosis not present

## 2018-09-28 DIAGNOSIS — I69951 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: Secondary | ICD-10-CM | POA: Diagnosis not present

## 2018-09-28 DIAGNOSIS — M6281 Muscle weakness (generalized): Secondary | ICD-10-CM | POA: Diagnosis not present

## 2018-09-28 DIAGNOSIS — R2681 Unsteadiness on feet: Secondary | ICD-10-CM | POA: Diagnosis not present

## 2018-09-29 DIAGNOSIS — R2681 Unsteadiness on feet: Secondary | ICD-10-CM | POA: Diagnosis not present

## 2018-09-29 DIAGNOSIS — R41841 Cognitive communication deficit: Secondary | ICD-10-CM | POA: Diagnosis not present

## 2018-09-29 DIAGNOSIS — I680 Cerebral amyloid angiopathy: Secondary | ICD-10-CM | POA: Diagnosis not present

## 2018-09-29 DIAGNOSIS — M6281 Muscle weakness (generalized): Secondary | ICD-10-CM | POA: Diagnosis not present

## 2018-09-29 DIAGNOSIS — R278 Other lack of coordination: Secondary | ICD-10-CM | POA: Diagnosis not present

## 2018-09-29 DIAGNOSIS — I69951 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: Secondary | ICD-10-CM | POA: Diagnosis not present

## 2018-09-30 DIAGNOSIS — R41841 Cognitive communication deficit: Secondary | ICD-10-CM | POA: Diagnosis not present

## 2018-09-30 DIAGNOSIS — M6281 Muscle weakness (generalized): Secondary | ICD-10-CM | POA: Diagnosis not present

## 2018-09-30 DIAGNOSIS — R278 Other lack of coordination: Secondary | ICD-10-CM | POA: Diagnosis not present

## 2018-09-30 DIAGNOSIS — I680 Cerebral amyloid angiopathy: Secondary | ICD-10-CM | POA: Diagnosis not present

## 2018-09-30 DIAGNOSIS — R2681 Unsteadiness on feet: Secondary | ICD-10-CM | POA: Diagnosis not present

## 2018-09-30 DIAGNOSIS — I69951 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: Secondary | ICD-10-CM | POA: Diagnosis not present

## 2018-10-01 ENCOUNTER — Telehealth: Payer: Self-pay | Admitting: *Deleted

## 2018-10-01 DIAGNOSIS — R278 Other lack of coordination: Secondary | ICD-10-CM | POA: Diagnosis not present

## 2018-10-01 DIAGNOSIS — R41841 Cognitive communication deficit: Secondary | ICD-10-CM | POA: Diagnosis not present

## 2018-10-01 DIAGNOSIS — I618 Other nontraumatic intracerebral hemorrhage: Secondary | ICD-10-CM | POA: Diagnosis not present

## 2018-10-01 DIAGNOSIS — G6289 Other specified polyneuropathies: Secondary | ICD-10-CM | POA: Diagnosis not present

## 2018-10-01 DIAGNOSIS — G4089 Other seizures: Secondary | ICD-10-CM | POA: Diagnosis not present

## 2018-10-01 DIAGNOSIS — I10 Essential (primary) hypertension: Secondary | ICD-10-CM | POA: Diagnosis not present

## 2018-10-01 DIAGNOSIS — I69951 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: Secondary | ICD-10-CM | POA: Diagnosis not present

## 2018-10-01 DIAGNOSIS — R2681 Unsteadiness on feet: Secondary | ICD-10-CM | POA: Diagnosis not present

## 2018-10-01 DIAGNOSIS — M6281 Muscle weakness (generalized): Secondary | ICD-10-CM | POA: Diagnosis not present

## 2018-10-01 DIAGNOSIS — I680 Cerebral amyloid angiopathy: Secondary | ICD-10-CM | POA: Diagnosis not present

## 2018-10-01 NOTE — Telephone Encounter (Signed)
Checked with [facility] radiology and they stated that appointment was cancelled on 09/23/18.  Spoke with daughter and she stated that she will call the facility where her mom is and speak with them.  She is not sure why she was not taken to her appointment.

## 2018-10-01 NOTE — Telephone Encounter (Signed)
Copied from CRM [id number]. Topic: General - Other >> Oct 01, 2018  8:53 AM Fanny Bien wrote: Reason for CRM: pt daughter called and stated that she has not received results from MRI done on 09/23/18. Pt daughter would like a call back as soon as possible regarding. Please advise

## 2018-10-02 DIAGNOSIS — M6281 Muscle weakness (generalized): Secondary | ICD-10-CM | POA: Diagnosis not present

## 2018-10-02 DIAGNOSIS — R41841 Cognitive communication deficit: Secondary | ICD-10-CM | POA: Diagnosis not present

## 2018-10-02 DIAGNOSIS — R2681 Unsteadiness on feet: Secondary | ICD-10-CM | POA: Diagnosis not present

## 2018-10-02 DIAGNOSIS — I69951 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: Secondary | ICD-10-CM | POA: Diagnosis not present

## 2018-10-02 DIAGNOSIS — I680 Cerebral amyloid angiopathy: Secondary | ICD-10-CM | POA: Diagnosis not present

## 2018-10-02 DIAGNOSIS — R278 Other lack of coordination: Secondary | ICD-10-CM | POA: Diagnosis not present

## 2018-10-03 DIAGNOSIS — I680 Cerebral amyloid angiopathy: Secondary | ICD-10-CM | POA: Diagnosis not present

## 2018-10-03 DIAGNOSIS — M6281 Muscle weakness (generalized): Secondary | ICD-10-CM | POA: Diagnosis not present

## 2018-10-03 DIAGNOSIS — R2681 Unsteadiness on feet: Secondary | ICD-10-CM | POA: Diagnosis not present

## 2018-10-03 DIAGNOSIS — R278 Other lack of coordination: Secondary | ICD-10-CM | POA: Diagnosis not present

## 2018-10-03 DIAGNOSIS — I69951 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: Secondary | ICD-10-CM | POA: Diagnosis not present

## 2018-10-03 DIAGNOSIS — R41841 Cognitive communication deficit: Secondary | ICD-10-CM | POA: Diagnosis not present

## 2018-10-04 DIAGNOSIS — M4800 Spinal stenosis, site unspecified: Secondary | ICD-10-CM | POA: Diagnosis not present

## 2018-10-04 DIAGNOSIS — I69398 Other sequelae of cerebral infarction: Secondary | ICD-10-CM | POA: Diagnosis not present

## 2018-10-04 DIAGNOSIS — R569 Unspecified convulsions: Secondary | ICD-10-CM | POA: Diagnosis not present

## 2018-10-05 DIAGNOSIS — M4800 Spinal stenosis, site unspecified: Secondary | ICD-10-CM | POA: Diagnosis not present

## 2018-10-05 DIAGNOSIS — I639 Cerebral infarction, unspecified: Secondary | ICD-10-CM | POA: Diagnosis not present

## 2018-10-05 DIAGNOSIS — R569 Unspecified convulsions: Secondary | ICD-10-CM | POA: Diagnosis not present

## 2018-10-05 DIAGNOSIS — I69951 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: Secondary | ICD-10-CM | POA: Diagnosis not present

## 2018-10-05 DIAGNOSIS — M6281 Muscle weakness (generalized): Secondary | ICD-10-CM | POA: Diagnosis not present

## 2018-10-05 DIAGNOSIS — R41841 Cognitive communication deficit: Secondary | ICD-10-CM | POA: Diagnosis not present

## 2018-10-05 DIAGNOSIS — R2681 Unsteadiness on feet: Secondary | ICD-10-CM | POA: Diagnosis not present

## 2018-10-05 DIAGNOSIS — I69398 Other sequelae of cerebral infarction: Secondary | ICD-10-CM | POA: Diagnosis not present

## 2018-10-05 DIAGNOSIS — R278 Other lack of coordination: Secondary | ICD-10-CM | POA: Diagnosis not present

## 2018-10-05 DIAGNOSIS — I680 Cerebral amyloid angiopathy: Secondary | ICD-10-CM | POA: Diagnosis not present

## 2018-10-06 DIAGNOSIS — R2681 Unsteadiness on feet: Secondary | ICD-10-CM | POA: Diagnosis not present

## 2018-10-06 DIAGNOSIS — R278 Other lack of coordination: Secondary | ICD-10-CM | POA: Diagnosis not present

## 2018-10-06 DIAGNOSIS — R41841 Cognitive communication deficit: Secondary | ICD-10-CM | POA: Diagnosis not present

## 2018-10-06 DIAGNOSIS — M6281 Muscle weakness (generalized): Secondary | ICD-10-CM | POA: Diagnosis not present

## 2018-10-06 DIAGNOSIS — I680 Cerebral amyloid angiopathy: Secondary | ICD-10-CM | POA: Diagnosis not present

## 2018-10-06 DIAGNOSIS — G4089 Other seizures: Secondary | ICD-10-CM | POA: Diagnosis not present

## 2018-10-06 DIAGNOSIS — I6389 Other cerebral infarction: Secondary | ICD-10-CM | POA: Diagnosis not present

## 2018-10-06 DIAGNOSIS — D6489 Other specified anemias: Secondary | ICD-10-CM | POA: Diagnosis not present

## 2018-10-06 DIAGNOSIS — I69951 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: Secondary | ICD-10-CM | POA: Diagnosis not present

## 2018-10-06 DIAGNOSIS — I10 Essential (primary) hypertension: Secondary | ICD-10-CM | POA: Diagnosis not present

## 2018-10-06 DIAGNOSIS — F3489 Other specified persistent mood disorders: Secondary | ICD-10-CM | POA: Diagnosis not present

## 2018-10-06 DIAGNOSIS — G8191 Hemiplegia, unspecified affecting right dominant side: Secondary | ICD-10-CM | POA: Diagnosis not present

## 2018-10-06 DIAGNOSIS — E119 Type 2 diabetes mellitus without complications: Secondary | ICD-10-CM | POA: Diagnosis not present

## 2018-10-06 DIAGNOSIS — I619 Nontraumatic intracerebral hemorrhage, unspecified: Secondary | ICD-10-CM | POA: Diagnosis not present

## 2018-10-06 DIAGNOSIS — E854 Organ-limited amyloidosis: Secondary | ICD-10-CM | POA: Diagnosis not present

## 2018-10-07 DIAGNOSIS — I2699 Other pulmonary embolism without acute cor pulmonale: Secondary | ICD-10-CM | POA: Diagnosis not present

## 2018-10-07 DIAGNOSIS — I69951 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: Secondary | ICD-10-CM | POA: Diagnosis not present

## 2018-10-07 DIAGNOSIS — R41841 Cognitive communication deficit: Secondary | ICD-10-CM | POA: Diagnosis not present

## 2018-10-07 DIAGNOSIS — Z993 Dependence on wheelchair: Secondary | ICD-10-CM | POA: Diagnosis not present

## 2018-10-07 DIAGNOSIS — R2681 Unsteadiness on feet: Secondary | ICD-10-CM | POA: Diagnosis not present

## 2018-10-07 DIAGNOSIS — Z86711 Personal history of pulmonary embolism: Secondary | ICD-10-CM | POA: Diagnosis not present

## 2018-10-07 DIAGNOSIS — M6281 Muscle weakness (generalized): Secondary | ICD-10-CM | POA: Diagnosis not present

## 2018-10-07 DIAGNOSIS — Z7901 Long term (current) use of anticoagulants: Secondary | ICD-10-CM | POA: Diagnosis not present

## 2018-10-07 DIAGNOSIS — I680 Cerebral amyloid angiopathy: Secondary | ICD-10-CM | POA: Diagnosis not present

## 2018-10-07 DIAGNOSIS — Z5181 Encounter for therapeutic drug level monitoring: Secondary | ICD-10-CM | POA: Diagnosis not present

## 2018-10-07 DIAGNOSIS — N183 Chronic kidney disease, stage 3 (moderate): Secondary | ICD-10-CM | POA: Diagnosis not present

## 2018-10-07 DIAGNOSIS — I69351 Hemiplegia and hemiparesis following cerebral infarction affecting right dominant side: Secondary | ICD-10-CM | POA: Diagnosis not present

## 2018-10-07 DIAGNOSIS — R278 Other lack of coordination: Secondary | ICD-10-CM | POA: Diagnosis not present

## 2018-10-08 DIAGNOSIS — R41841 Cognitive communication deficit: Secondary | ICD-10-CM | POA: Diagnosis not present

## 2018-10-08 DIAGNOSIS — R2681 Unsteadiness on feet: Secondary | ICD-10-CM | POA: Diagnosis not present

## 2018-10-08 DIAGNOSIS — R278 Other lack of coordination: Secondary | ICD-10-CM | POA: Diagnosis not present

## 2018-10-08 DIAGNOSIS — I680 Cerebral amyloid angiopathy: Secondary | ICD-10-CM | POA: Diagnosis not present

## 2018-10-08 DIAGNOSIS — M6281 Muscle weakness (generalized): Secondary | ICD-10-CM | POA: Diagnosis not present

## 2018-10-08 DIAGNOSIS — I69951 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: Secondary | ICD-10-CM | POA: Diagnosis not present

## 2018-10-09 DIAGNOSIS — R278 Other lack of coordination: Secondary | ICD-10-CM | POA: Diagnosis not present

## 2018-10-09 DIAGNOSIS — R2681 Unsteadiness on feet: Secondary | ICD-10-CM | POA: Diagnosis not present

## 2018-10-09 DIAGNOSIS — M6281 Muscle weakness (generalized): Secondary | ICD-10-CM | POA: Diagnosis not present

## 2018-10-09 DIAGNOSIS — I680 Cerebral amyloid angiopathy: Secondary | ICD-10-CM | POA: Diagnosis not present

## 2018-10-09 DIAGNOSIS — I69951 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: Secondary | ICD-10-CM | POA: Diagnosis not present

## 2018-10-09 DIAGNOSIS — R41841 Cognitive communication deficit: Secondary | ICD-10-CM | POA: Diagnosis not present

## 2018-10-10 DIAGNOSIS — I69951 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: Secondary | ICD-10-CM | POA: Diagnosis not present

## 2018-10-10 DIAGNOSIS — R278 Other lack of coordination: Secondary | ICD-10-CM | POA: Diagnosis not present

## 2018-10-10 DIAGNOSIS — R41841 Cognitive communication deficit: Secondary | ICD-10-CM | POA: Diagnosis not present

## 2018-10-10 DIAGNOSIS — M6281 Muscle weakness (generalized): Secondary | ICD-10-CM | POA: Diagnosis not present

## 2018-10-10 DIAGNOSIS — I680 Cerebral amyloid angiopathy: Secondary | ICD-10-CM | POA: Diagnosis not present

## 2018-10-10 DIAGNOSIS — R2681 Unsteadiness on feet: Secondary | ICD-10-CM | POA: Diagnosis not present

## 2018-10-12 DIAGNOSIS — I680 Cerebral amyloid angiopathy: Secondary | ICD-10-CM | POA: Diagnosis not present

## 2018-10-12 DIAGNOSIS — R278 Other lack of coordination: Secondary | ICD-10-CM | POA: Diagnosis not present

## 2018-10-12 DIAGNOSIS — R2681 Unsteadiness on feet: Secondary | ICD-10-CM | POA: Diagnosis not present

## 2018-10-12 DIAGNOSIS — M6281 Muscle weakness (generalized): Secondary | ICD-10-CM | POA: Diagnosis not present

## 2018-10-12 DIAGNOSIS — R41841 Cognitive communication deficit: Secondary | ICD-10-CM | POA: Diagnosis not present

## 2018-10-12 DIAGNOSIS — I69951 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: Secondary | ICD-10-CM | POA: Diagnosis not present

## 2018-10-13 DIAGNOSIS — I69951 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: Secondary | ICD-10-CM | POA: Diagnosis not present

## 2018-10-13 DIAGNOSIS — R278 Other lack of coordination: Secondary | ICD-10-CM | POA: Diagnosis not present

## 2018-10-13 DIAGNOSIS — R41841 Cognitive communication deficit: Secondary | ICD-10-CM | POA: Diagnosis not present

## 2018-10-13 DIAGNOSIS — I680 Cerebral amyloid angiopathy: Secondary | ICD-10-CM | POA: Diagnosis not present

## 2018-10-13 DIAGNOSIS — M7989 Other specified soft tissue disorders: Secondary | ICD-10-CM | POA: Diagnosis not present

## 2018-10-13 DIAGNOSIS — M6281 Muscle weakness (generalized): Secondary | ICD-10-CM | POA: Diagnosis not present

## 2018-10-13 DIAGNOSIS — R2681 Unsteadiness on feet: Secondary | ICD-10-CM | POA: Diagnosis not present

## 2018-10-14 DIAGNOSIS — R41841 Cognitive communication deficit: Secondary | ICD-10-CM | POA: Diagnosis not present

## 2018-10-14 DIAGNOSIS — I69951 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: Secondary | ICD-10-CM | POA: Diagnosis not present

## 2018-10-14 DIAGNOSIS — I680 Cerebral amyloid angiopathy: Secondary | ICD-10-CM | POA: Diagnosis not present

## 2018-10-14 DIAGNOSIS — R2681 Unsteadiness on feet: Secondary | ICD-10-CM | POA: Diagnosis not present

## 2018-10-14 DIAGNOSIS — M6281 Muscle weakness (generalized): Secondary | ICD-10-CM | POA: Diagnosis not present

## 2018-10-14 DIAGNOSIS — R278 Other lack of coordination: Secondary | ICD-10-CM | POA: Diagnosis not present

## 2018-10-15 DIAGNOSIS — I69951 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: Secondary | ICD-10-CM | POA: Diagnosis not present

## 2018-10-15 DIAGNOSIS — R2681 Unsteadiness on feet: Secondary | ICD-10-CM | POA: Diagnosis not present

## 2018-10-15 DIAGNOSIS — R41841 Cognitive communication deficit: Secondary | ICD-10-CM | POA: Diagnosis not present

## 2018-10-15 DIAGNOSIS — R278 Other lack of coordination: Secondary | ICD-10-CM | POA: Diagnosis not present

## 2018-10-15 DIAGNOSIS — I680 Cerebral amyloid angiopathy: Secondary | ICD-10-CM | POA: Diagnosis not present

## 2018-10-15 DIAGNOSIS — M6281 Muscle weakness (generalized): Secondary | ICD-10-CM | POA: Diagnosis not present

## 2018-10-16 DIAGNOSIS — I69951 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: Secondary | ICD-10-CM | POA: Diagnosis not present

## 2018-10-16 DIAGNOSIS — I680 Cerebral amyloid angiopathy: Secondary | ICD-10-CM | POA: Diagnosis not present

## 2018-10-16 DIAGNOSIS — R278 Other lack of coordination: Secondary | ICD-10-CM | POA: Diagnosis not present

## 2018-10-16 DIAGNOSIS — R2681 Unsteadiness on feet: Secondary | ICD-10-CM | POA: Diagnosis not present

## 2018-10-16 DIAGNOSIS — R41841 Cognitive communication deficit: Secondary | ICD-10-CM | POA: Diagnosis not present

## 2018-10-16 DIAGNOSIS — M6281 Muscle weakness (generalized): Secondary | ICD-10-CM | POA: Diagnosis not present

## 2018-10-19 DIAGNOSIS — R569 Unspecified convulsions: Secondary | ICD-10-CM | POA: Diagnosis not present

## 2018-10-19 DIAGNOSIS — I69951 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: Secondary | ICD-10-CM | POA: Diagnosis not present

## 2018-10-19 DIAGNOSIS — I639 Cerebral infarction, unspecified: Secondary | ICD-10-CM | POA: Diagnosis not present

## 2018-10-19 DIAGNOSIS — M6281 Muscle weakness (generalized): Secondary | ICD-10-CM | POA: Diagnosis not present

## 2018-10-19 DIAGNOSIS — R278 Other lack of coordination: Secondary | ICD-10-CM | POA: Diagnosis not present

## 2018-10-19 DIAGNOSIS — G4700 Insomnia, unspecified: Secondary | ICD-10-CM | POA: Diagnosis not present

## 2018-10-19 DIAGNOSIS — I615 Nontraumatic intracerebral hemorrhage, intraventricular: Secondary | ICD-10-CM | POA: Diagnosis not present

## 2018-10-19 DIAGNOSIS — R4701 Aphasia: Secondary | ICD-10-CM | POA: Diagnosis not present

## 2018-10-19 DIAGNOSIS — R2681 Unsteadiness on feet: Secondary | ICD-10-CM | POA: Diagnosis not present

## 2018-10-19 DIAGNOSIS — I680 Cerebral amyloid angiopathy: Secondary | ICD-10-CM | POA: Diagnosis not present

## 2018-10-19 DIAGNOSIS — R41841 Cognitive communication deficit: Secondary | ICD-10-CM | POA: Diagnosis not present

## 2018-10-20 DIAGNOSIS — M6281 Muscle weakness (generalized): Secondary | ICD-10-CM | POA: Diagnosis not present

## 2018-10-20 DIAGNOSIS — R41841 Cognitive communication deficit: Secondary | ICD-10-CM | POA: Diagnosis not present

## 2018-10-20 DIAGNOSIS — I680 Cerebral amyloid angiopathy: Secondary | ICD-10-CM | POA: Diagnosis not present

## 2018-10-20 DIAGNOSIS — R2681 Unsteadiness on feet: Secondary | ICD-10-CM | POA: Diagnosis not present

## 2018-10-20 DIAGNOSIS — M7989 Other specified soft tissue disorders: Secondary | ICD-10-CM | POA: Diagnosis not present

## 2018-10-20 DIAGNOSIS — I69951 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: Secondary | ICD-10-CM | POA: Diagnosis not present

## 2018-10-20 DIAGNOSIS — R278 Other lack of coordination: Secondary | ICD-10-CM | POA: Diagnosis not present

## 2018-10-21 DIAGNOSIS — I69951 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: Secondary | ICD-10-CM | POA: Diagnosis not present

## 2018-10-21 DIAGNOSIS — R41841 Cognitive communication deficit: Secondary | ICD-10-CM | POA: Diagnosis not present

## 2018-10-21 DIAGNOSIS — R278 Other lack of coordination: Secondary | ICD-10-CM | POA: Diagnosis not present

## 2018-10-21 DIAGNOSIS — R2681 Unsteadiness on feet: Secondary | ICD-10-CM | POA: Diagnosis not present

## 2018-10-21 DIAGNOSIS — M6281 Muscle weakness (generalized): Secondary | ICD-10-CM | POA: Diagnosis not present

## 2018-10-21 DIAGNOSIS — I680 Cerebral amyloid angiopathy: Secondary | ICD-10-CM | POA: Diagnosis not present

## 2018-10-22 DIAGNOSIS — R278 Other lack of coordination: Secondary | ICD-10-CM | POA: Diagnosis not present

## 2018-10-22 DIAGNOSIS — I69951 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: Secondary | ICD-10-CM | POA: Diagnosis not present

## 2018-10-22 DIAGNOSIS — I680 Cerebral amyloid angiopathy: Secondary | ICD-10-CM | POA: Diagnosis not present

## 2018-10-22 DIAGNOSIS — R41841 Cognitive communication deficit: Secondary | ICD-10-CM | POA: Diagnosis not present

## 2018-10-22 DIAGNOSIS — R2681 Unsteadiness on feet: Secondary | ICD-10-CM | POA: Diagnosis not present

## 2018-10-22 DIAGNOSIS — M6281 Muscle weakness (generalized): Secondary | ICD-10-CM | POA: Diagnosis not present

## 2018-10-23 DIAGNOSIS — I680 Cerebral amyloid angiopathy: Secondary | ICD-10-CM | POA: Diagnosis not present

## 2018-10-23 DIAGNOSIS — R41841 Cognitive communication deficit: Secondary | ICD-10-CM | POA: Diagnosis not present

## 2018-10-23 DIAGNOSIS — M6281 Muscle weakness (generalized): Secondary | ICD-10-CM | POA: Diagnosis not present

## 2018-10-23 DIAGNOSIS — I69951 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: Secondary | ICD-10-CM | POA: Diagnosis not present

## 2018-10-23 DIAGNOSIS — R2681 Unsteadiness on feet: Secondary | ICD-10-CM | POA: Diagnosis not present

## 2018-10-23 DIAGNOSIS — R278 Other lack of coordination: Secondary | ICD-10-CM | POA: Diagnosis not present

## 2018-10-24 DIAGNOSIS — R2681 Unsteadiness on feet: Secondary | ICD-10-CM | POA: Diagnosis not present

## 2018-10-24 DIAGNOSIS — I680 Cerebral amyloid angiopathy: Secondary | ICD-10-CM | POA: Diagnosis not present

## 2018-10-24 DIAGNOSIS — M6281 Muscle weakness (generalized): Secondary | ICD-10-CM | POA: Diagnosis not present

## 2018-10-24 DIAGNOSIS — R278 Other lack of coordination: Secondary | ICD-10-CM | POA: Diagnosis not present

## 2018-10-24 DIAGNOSIS — I69951 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: Secondary | ICD-10-CM | POA: Diagnosis not present

## 2018-10-24 DIAGNOSIS — R41841 Cognitive communication deficit: Secondary | ICD-10-CM | POA: Diagnosis not present

## 2018-10-26 DIAGNOSIS — I69951 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: Secondary | ICD-10-CM | POA: Diagnosis not present

## 2018-10-26 DIAGNOSIS — M6281 Muscle weakness (generalized): Secondary | ICD-10-CM | POA: Diagnosis not present

## 2018-10-26 DIAGNOSIS — R41841 Cognitive communication deficit: Secondary | ICD-10-CM | POA: Diagnosis not present

## 2018-10-26 DIAGNOSIS — I680 Cerebral amyloid angiopathy: Secondary | ICD-10-CM | POA: Diagnosis not present

## 2018-10-26 DIAGNOSIS — R278 Other lack of coordination: Secondary | ICD-10-CM | POA: Diagnosis not present

## 2018-10-26 DIAGNOSIS — R2681 Unsteadiness on feet: Secondary | ICD-10-CM | POA: Diagnosis not present

## 2018-10-27 DIAGNOSIS — I69951 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: Secondary | ICD-10-CM | POA: Diagnosis not present

## 2018-10-27 DIAGNOSIS — I680 Cerebral amyloid angiopathy: Secondary | ICD-10-CM | POA: Diagnosis not present

## 2018-10-27 DIAGNOSIS — R278 Other lack of coordination: Secondary | ICD-10-CM | POA: Diagnosis not present

## 2018-10-27 DIAGNOSIS — R41841 Cognitive communication deficit: Secondary | ICD-10-CM | POA: Diagnosis not present

## 2018-10-27 DIAGNOSIS — R2681 Unsteadiness on feet: Secondary | ICD-10-CM | POA: Diagnosis not present

## 2018-10-27 DIAGNOSIS — M6281 Muscle weakness (generalized): Secondary | ICD-10-CM | POA: Diagnosis not present

## 2018-10-28 DIAGNOSIS — I69951 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: Secondary | ICD-10-CM | POA: Diagnosis not present

## 2018-10-28 DIAGNOSIS — M6281 Muscle weakness (generalized): Secondary | ICD-10-CM | POA: Diagnosis not present

## 2018-10-28 DIAGNOSIS — R41841 Cognitive communication deficit: Secondary | ICD-10-CM | POA: Diagnosis not present

## 2018-10-28 DIAGNOSIS — I680 Cerebral amyloid angiopathy: Secondary | ICD-10-CM | POA: Diagnosis not present

## 2018-10-28 DIAGNOSIS — R2681 Unsteadiness on feet: Secondary | ICD-10-CM | POA: Diagnosis not present

## 2018-10-28 DIAGNOSIS — R278 Other lack of coordination: Secondary | ICD-10-CM | POA: Diagnosis not present

## 2018-10-29 DIAGNOSIS — R278 Other lack of coordination: Secondary | ICD-10-CM | POA: Diagnosis not present

## 2018-10-29 DIAGNOSIS — R41841 Cognitive communication deficit: Secondary | ICD-10-CM | POA: Diagnosis not present

## 2018-10-29 DIAGNOSIS — I680 Cerebral amyloid angiopathy: Secondary | ICD-10-CM | POA: Diagnosis not present

## 2018-10-29 DIAGNOSIS — M6281 Muscle weakness (generalized): Secondary | ICD-10-CM | POA: Diagnosis not present

## 2018-10-29 DIAGNOSIS — I69951 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: Secondary | ICD-10-CM | POA: Diagnosis not present

## 2018-10-29 DIAGNOSIS — R2681 Unsteadiness on feet: Secondary | ICD-10-CM | POA: Diagnosis not present

## 2018-10-30 DIAGNOSIS — R2681 Unsteadiness on feet: Secondary | ICD-10-CM | POA: Diagnosis not present

## 2018-10-30 DIAGNOSIS — I69951 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: Secondary | ICD-10-CM | POA: Diagnosis not present

## 2018-10-30 DIAGNOSIS — R278 Other lack of coordination: Secondary | ICD-10-CM | POA: Diagnosis not present

## 2018-10-30 DIAGNOSIS — I680 Cerebral amyloid angiopathy: Secondary | ICD-10-CM | POA: Diagnosis not present

## 2018-10-30 DIAGNOSIS — M6281 Muscle weakness (generalized): Secondary | ICD-10-CM | POA: Diagnosis not present

## 2018-10-30 DIAGNOSIS — R41841 Cognitive communication deficit: Secondary | ICD-10-CM | POA: Diagnosis not present

## 2018-11-01 DIAGNOSIS — I680 Cerebral amyloid angiopathy: Secondary | ICD-10-CM | POA: Diagnosis not present

## 2018-11-01 DIAGNOSIS — M6281 Muscle weakness (generalized): Secondary | ICD-10-CM | POA: Diagnosis not present

## 2018-11-01 DIAGNOSIS — R41841 Cognitive communication deficit: Secondary | ICD-10-CM | POA: Diagnosis not present

## 2018-11-01 DIAGNOSIS — I69951 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: Secondary | ICD-10-CM | POA: Diagnosis not present

## 2018-11-01 DIAGNOSIS — R278 Other lack of coordination: Secondary | ICD-10-CM | POA: Diagnosis not present

## 2018-11-01 DIAGNOSIS — R2681 Unsteadiness on feet: Secondary | ICD-10-CM | POA: Diagnosis not present

## 2018-11-02 DIAGNOSIS — I615 Nontraumatic intracerebral hemorrhage, intraventricular: Secondary | ICD-10-CM | POA: Diagnosis not present

## 2018-11-02 DIAGNOSIS — I69951 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: Secondary | ICD-10-CM | POA: Diagnosis not present

## 2018-11-02 DIAGNOSIS — G629 Polyneuropathy, unspecified: Secondary | ICD-10-CM | POA: Diagnosis not present

## 2018-11-02 DIAGNOSIS — G4700 Insomnia, unspecified: Secondary | ICD-10-CM | POA: Diagnosis not present

## 2018-11-02 DIAGNOSIS — I680 Cerebral amyloid angiopathy: Secondary | ICD-10-CM | POA: Diagnosis not present

## 2018-11-02 DIAGNOSIS — F330 Major depressive disorder, recurrent, mild: Secondary | ICD-10-CM | POA: Diagnosis not present

## 2018-11-02 DIAGNOSIS — R569 Unspecified convulsions: Secondary | ICD-10-CM | POA: Diagnosis not present

## 2018-11-02 DIAGNOSIS — I639 Cerebral infarction, unspecified: Secondary | ICD-10-CM | POA: Diagnosis not present

## 2018-11-02 DIAGNOSIS — R4701 Aphasia: Secondary | ICD-10-CM | POA: Diagnosis not present

## 2018-11-03 DIAGNOSIS — R278 Other lack of coordination: Secondary | ICD-10-CM | POA: Diagnosis not present

## 2018-11-03 DIAGNOSIS — I69951 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: Secondary | ICD-10-CM | POA: Diagnosis not present

## 2018-11-03 DIAGNOSIS — E876 Hypokalemia: Secondary | ICD-10-CM | POA: Diagnosis not present

## 2018-11-03 DIAGNOSIS — F0631 Mood disorder due to known physiological condition with depressive features: Secondary | ICD-10-CM | POA: Diagnosis not present

## 2018-11-03 DIAGNOSIS — R41841 Cognitive communication deficit: Secondary | ICD-10-CM | POA: Diagnosis not present

## 2018-11-03 DIAGNOSIS — I618 Other nontraumatic intracerebral hemorrhage: Secondary | ICD-10-CM | POA: Diagnosis not present

## 2018-11-03 DIAGNOSIS — E038 Other specified hypothyroidism: Secondary | ICD-10-CM | POA: Diagnosis not present

## 2018-11-03 DIAGNOSIS — G4089 Other seizures: Secondary | ICD-10-CM | POA: Diagnosis not present

## 2018-11-03 DIAGNOSIS — G6289 Other specified polyneuropathies: Secondary | ICD-10-CM | POA: Diagnosis not present

## 2018-11-03 DIAGNOSIS — M6281 Muscle weakness (generalized): Secondary | ICD-10-CM | POA: Diagnosis not present

## 2018-11-03 DIAGNOSIS — E119 Type 2 diabetes mellitus without complications: Secondary | ICD-10-CM | POA: Diagnosis not present

## 2018-11-03 DIAGNOSIS — R2681 Unsteadiness on feet: Secondary | ICD-10-CM | POA: Diagnosis not present

## 2018-11-03 DIAGNOSIS — I10 Essential (primary) hypertension: Secondary | ICD-10-CM | POA: Diagnosis not present

## 2018-11-03 DIAGNOSIS — I680 Cerebral amyloid angiopathy: Secondary | ICD-10-CM | POA: Diagnosis not present

## 2018-11-03 DIAGNOSIS — W1789XA Other fall from one level to another, initial encounter: Secondary | ICD-10-CM | POA: Diagnosis not present

## 2018-11-04 DIAGNOSIS — I680 Cerebral amyloid angiopathy: Secondary | ICD-10-CM | POA: Diagnosis not present

## 2018-11-04 DIAGNOSIS — R41841 Cognitive communication deficit: Secondary | ICD-10-CM | POA: Diagnosis not present

## 2018-11-04 DIAGNOSIS — M6281 Muscle weakness (generalized): Secondary | ICD-10-CM | POA: Diagnosis not present

## 2018-11-04 DIAGNOSIS — M4800 Spinal stenosis, site unspecified: Secondary | ICD-10-CM | POA: Diagnosis not present

## 2018-11-04 DIAGNOSIS — I69398 Other sequelae of cerebral infarction: Secondary | ICD-10-CM | POA: Diagnosis not present

## 2018-11-04 DIAGNOSIS — R2681 Unsteadiness on feet: Secondary | ICD-10-CM | POA: Diagnosis not present

## 2018-11-04 DIAGNOSIS — R569 Unspecified convulsions: Secondary | ICD-10-CM | POA: Diagnosis not present

## 2018-11-04 DIAGNOSIS — R278 Other lack of coordination: Secondary | ICD-10-CM | POA: Diagnosis not present

## 2018-11-04 DIAGNOSIS — I69951 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: Secondary | ICD-10-CM | POA: Diagnosis not present

## 2018-11-05 DIAGNOSIS — I680 Cerebral amyloid angiopathy: Secondary | ICD-10-CM | POA: Diagnosis not present

## 2018-11-05 DIAGNOSIS — M4800 Spinal stenosis, site unspecified: Secondary | ICD-10-CM | POA: Diagnosis not present

## 2018-11-05 DIAGNOSIS — R569 Unspecified convulsions: Secondary | ICD-10-CM | POA: Diagnosis not present

## 2018-11-05 DIAGNOSIS — I69951 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: Secondary | ICD-10-CM | POA: Diagnosis not present

## 2018-11-05 DIAGNOSIS — R41841 Cognitive communication deficit: Secondary | ICD-10-CM | POA: Diagnosis not present

## 2018-11-05 DIAGNOSIS — R278 Other lack of coordination: Secondary | ICD-10-CM | POA: Diagnosis not present

## 2018-11-05 DIAGNOSIS — I69398 Other sequelae of cerebral infarction: Secondary | ICD-10-CM | POA: Diagnosis not present

## 2018-11-05 DIAGNOSIS — R2681 Unsteadiness on feet: Secondary | ICD-10-CM | POA: Diagnosis not present

## 2018-11-05 DIAGNOSIS — I639 Cerebral infarction, unspecified: Secondary | ICD-10-CM | POA: Diagnosis not present

## 2018-11-05 DIAGNOSIS — M6281 Muscle weakness (generalized): Secondary | ICD-10-CM | POA: Diagnosis not present

## 2018-11-06 DIAGNOSIS — R2681 Unsteadiness on feet: Secondary | ICD-10-CM | POA: Diagnosis not present

## 2018-11-06 DIAGNOSIS — R41841 Cognitive communication deficit: Secondary | ICD-10-CM | POA: Diagnosis not present

## 2018-11-06 DIAGNOSIS — M6281 Muscle weakness (generalized): Secondary | ICD-10-CM | POA: Diagnosis not present

## 2018-11-06 DIAGNOSIS — R278 Other lack of coordination: Secondary | ICD-10-CM | POA: Diagnosis not present

## 2018-11-06 DIAGNOSIS — I680 Cerebral amyloid angiopathy: Secondary | ICD-10-CM | POA: Diagnosis not present

## 2018-11-06 DIAGNOSIS — I69951 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: Secondary | ICD-10-CM | POA: Diagnosis not present

## 2018-11-07 DIAGNOSIS — I69951 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: Secondary | ICD-10-CM | POA: Diagnosis not present

## 2018-11-07 DIAGNOSIS — M6281 Muscle weakness (generalized): Secondary | ICD-10-CM | POA: Diagnosis not present

## 2018-11-07 DIAGNOSIS — R2681 Unsteadiness on feet: Secondary | ICD-10-CM | POA: Diagnosis not present

## 2018-11-07 DIAGNOSIS — I680 Cerebral amyloid angiopathy: Secondary | ICD-10-CM | POA: Diagnosis not present

## 2018-11-07 DIAGNOSIS — R278 Other lack of coordination: Secondary | ICD-10-CM | POA: Diagnosis not present

## 2018-11-07 DIAGNOSIS — R41841 Cognitive communication deficit: Secondary | ICD-10-CM | POA: Diagnosis not present

## 2018-11-09 DIAGNOSIS — I680 Cerebral amyloid angiopathy: Secondary | ICD-10-CM | POA: Diagnosis not present

## 2018-11-09 DIAGNOSIS — R2681 Unsteadiness on feet: Secondary | ICD-10-CM | POA: Diagnosis not present

## 2018-11-09 DIAGNOSIS — I69951 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: Secondary | ICD-10-CM | POA: Diagnosis not present

## 2018-11-09 DIAGNOSIS — R278 Other lack of coordination: Secondary | ICD-10-CM | POA: Diagnosis not present

## 2018-11-09 DIAGNOSIS — M6281 Muscle weakness (generalized): Secondary | ICD-10-CM | POA: Diagnosis not present

## 2018-11-09 DIAGNOSIS — R41841 Cognitive communication deficit: Secondary | ICD-10-CM | POA: Diagnosis not present

## 2018-11-10 DIAGNOSIS — R278 Other lack of coordination: Secondary | ICD-10-CM | POA: Diagnosis not present

## 2018-11-10 DIAGNOSIS — I680 Cerebral amyloid angiopathy: Secondary | ICD-10-CM | POA: Diagnosis not present

## 2018-11-10 DIAGNOSIS — I69951 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: Secondary | ICD-10-CM | POA: Diagnosis not present

## 2018-11-10 DIAGNOSIS — M6281 Muscle weakness (generalized): Secondary | ICD-10-CM | POA: Diagnosis not present

## 2018-11-10 DIAGNOSIS — R2681 Unsteadiness on feet: Secondary | ICD-10-CM | POA: Diagnosis not present

## 2018-11-10 DIAGNOSIS — R41841 Cognitive communication deficit: Secondary | ICD-10-CM | POA: Diagnosis not present

## 2018-11-11 DIAGNOSIS — I680 Cerebral amyloid angiopathy: Secondary | ICD-10-CM | POA: Diagnosis not present

## 2018-11-11 DIAGNOSIS — I69951 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: Secondary | ICD-10-CM | POA: Diagnosis not present

## 2018-11-11 DIAGNOSIS — R2681 Unsteadiness on feet: Secondary | ICD-10-CM | POA: Diagnosis not present

## 2018-11-11 DIAGNOSIS — M6281 Muscle weakness (generalized): Secondary | ICD-10-CM | POA: Diagnosis not present

## 2018-11-11 DIAGNOSIS — R41841 Cognitive communication deficit: Secondary | ICD-10-CM | POA: Diagnosis not present

## 2018-11-11 DIAGNOSIS — R278 Other lack of coordination: Secondary | ICD-10-CM | POA: Diagnosis not present

## 2018-11-12 DIAGNOSIS — M6281 Muscle weakness (generalized): Secondary | ICD-10-CM | POA: Diagnosis not present

## 2018-11-12 DIAGNOSIS — I69951 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: Secondary | ICD-10-CM | POA: Diagnosis not present

## 2018-11-12 DIAGNOSIS — I680 Cerebral amyloid angiopathy: Secondary | ICD-10-CM | POA: Diagnosis not present

## 2018-11-12 DIAGNOSIS — R2681 Unsteadiness on feet: Secondary | ICD-10-CM | POA: Diagnosis not present

## 2018-11-12 DIAGNOSIS — R41841 Cognitive communication deficit: Secondary | ICD-10-CM | POA: Diagnosis not present

## 2018-11-12 DIAGNOSIS — R278 Other lack of coordination: Secondary | ICD-10-CM | POA: Diagnosis not present

## 2018-11-13 DIAGNOSIS — R278 Other lack of coordination: Secondary | ICD-10-CM | POA: Diagnosis not present

## 2018-11-13 DIAGNOSIS — I69951 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: Secondary | ICD-10-CM | POA: Diagnosis not present

## 2018-11-13 DIAGNOSIS — R41841 Cognitive communication deficit: Secondary | ICD-10-CM | POA: Diagnosis not present

## 2018-11-13 DIAGNOSIS — R2681 Unsteadiness on feet: Secondary | ICD-10-CM | POA: Diagnosis not present

## 2018-11-13 DIAGNOSIS — I680 Cerebral amyloid angiopathy: Secondary | ICD-10-CM | POA: Diagnosis not present

## 2018-11-13 DIAGNOSIS — M6281 Muscle weakness (generalized): Secondary | ICD-10-CM | POA: Diagnosis not present

## 2018-11-14 DIAGNOSIS — R278 Other lack of coordination: Secondary | ICD-10-CM | POA: Diagnosis not present

## 2018-11-14 DIAGNOSIS — I680 Cerebral amyloid angiopathy: Secondary | ICD-10-CM | POA: Diagnosis not present

## 2018-11-14 DIAGNOSIS — I69951 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: Secondary | ICD-10-CM | POA: Diagnosis not present

## 2018-11-14 DIAGNOSIS — R2681 Unsteadiness on feet: Secondary | ICD-10-CM | POA: Diagnosis not present

## 2018-11-14 DIAGNOSIS — R41841 Cognitive communication deficit: Secondary | ICD-10-CM | POA: Diagnosis not present

## 2018-11-14 DIAGNOSIS — M6281 Muscle weakness (generalized): Secondary | ICD-10-CM | POA: Diagnosis not present

## 2018-11-15 DIAGNOSIS — R2681 Unsteadiness on feet: Secondary | ICD-10-CM | POA: Diagnosis not present

## 2018-11-15 DIAGNOSIS — I680 Cerebral amyloid angiopathy: Secondary | ICD-10-CM | POA: Diagnosis not present

## 2018-11-15 DIAGNOSIS — R41841 Cognitive communication deficit: Secondary | ICD-10-CM | POA: Diagnosis not present

## 2018-11-15 DIAGNOSIS — I69951 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: Secondary | ICD-10-CM | POA: Diagnosis not present

## 2018-11-15 DIAGNOSIS — R278 Other lack of coordination: Secondary | ICD-10-CM | POA: Diagnosis not present

## 2018-11-15 DIAGNOSIS — M6281 Muscle weakness (generalized): Secondary | ICD-10-CM | POA: Diagnosis not present

## 2018-11-16 DIAGNOSIS — I680 Cerebral amyloid angiopathy: Secondary | ICD-10-CM | POA: Diagnosis not present

## 2018-11-16 DIAGNOSIS — R41841 Cognitive communication deficit: Secondary | ICD-10-CM | POA: Diagnosis not present

## 2018-11-16 DIAGNOSIS — M6281 Muscle weakness (generalized): Secondary | ICD-10-CM | POA: Diagnosis not present

## 2018-11-16 DIAGNOSIS — I69951 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: Secondary | ICD-10-CM | POA: Diagnosis not present

## 2018-11-16 DIAGNOSIS — R278 Other lack of coordination: Secondary | ICD-10-CM | POA: Diagnosis not present

## 2018-11-16 DIAGNOSIS — R2681 Unsteadiness on feet: Secondary | ICD-10-CM | POA: Diagnosis not present

## 2018-11-17 DIAGNOSIS — I680 Cerebral amyloid angiopathy: Secondary | ICD-10-CM | POA: Diagnosis not present

## 2018-11-17 DIAGNOSIS — R278 Other lack of coordination: Secondary | ICD-10-CM | POA: Diagnosis not present

## 2018-11-17 DIAGNOSIS — M6281 Muscle weakness (generalized): Secondary | ICD-10-CM | POA: Diagnosis not present

## 2018-11-17 DIAGNOSIS — R41841 Cognitive communication deficit: Secondary | ICD-10-CM | POA: Diagnosis not present

## 2018-11-17 DIAGNOSIS — R2681 Unsteadiness on feet: Secondary | ICD-10-CM | POA: Diagnosis not present

## 2018-11-17 DIAGNOSIS — I69951 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: Secondary | ICD-10-CM | POA: Diagnosis not present

## 2018-11-18 DIAGNOSIS — I69951 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: Secondary | ICD-10-CM | POA: Diagnosis not present

## 2018-11-18 DIAGNOSIS — R2681 Unsteadiness on feet: Secondary | ICD-10-CM | POA: Diagnosis not present

## 2018-11-18 DIAGNOSIS — R278 Other lack of coordination: Secondary | ICD-10-CM | POA: Diagnosis not present

## 2018-11-18 DIAGNOSIS — R41841 Cognitive communication deficit: Secondary | ICD-10-CM | POA: Diagnosis not present

## 2018-11-18 DIAGNOSIS — M6281 Muscle weakness (generalized): Secondary | ICD-10-CM | POA: Diagnosis not present

## 2018-11-18 DIAGNOSIS — I680 Cerebral amyloid angiopathy: Secondary | ICD-10-CM | POA: Diagnosis not present

## 2018-11-19 DIAGNOSIS — R2681 Unsteadiness on feet: Secondary | ICD-10-CM | POA: Diagnosis not present

## 2018-11-19 DIAGNOSIS — M6281 Muscle weakness (generalized): Secondary | ICD-10-CM | POA: Diagnosis not present

## 2018-11-19 DIAGNOSIS — I680 Cerebral amyloid angiopathy: Secondary | ICD-10-CM | POA: Diagnosis not present

## 2018-11-19 DIAGNOSIS — R278 Other lack of coordination: Secondary | ICD-10-CM | POA: Diagnosis not present

## 2018-11-19 DIAGNOSIS — R41841 Cognitive communication deficit: Secondary | ICD-10-CM | POA: Diagnosis not present

## 2018-11-19 DIAGNOSIS — I69951 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: Secondary | ICD-10-CM | POA: Diagnosis not present

## 2018-11-20 DIAGNOSIS — R41841 Cognitive communication deficit: Secondary | ICD-10-CM | POA: Diagnosis not present

## 2018-11-20 DIAGNOSIS — R2681 Unsteadiness on feet: Secondary | ICD-10-CM | POA: Diagnosis not present

## 2018-11-20 DIAGNOSIS — M79601 Pain in right arm: Secondary | ICD-10-CM | POA: Diagnosis not present

## 2018-11-20 DIAGNOSIS — M6281 Muscle weakness (generalized): Secondary | ICD-10-CM | POA: Diagnosis not present

## 2018-11-20 DIAGNOSIS — R278 Other lack of coordination: Secondary | ICD-10-CM | POA: Diagnosis not present

## 2018-11-20 DIAGNOSIS — I680 Cerebral amyloid angiopathy: Secondary | ICD-10-CM | POA: Diagnosis not present

## 2018-11-20 DIAGNOSIS — I69951 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: Secondary | ICD-10-CM | POA: Diagnosis not present

## 2018-11-20 DIAGNOSIS — B349 Viral infection, unspecified: Secondary | ICD-10-CM | POA: Diagnosis not present

## 2018-11-21 DIAGNOSIS — M6281 Muscle weakness (generalized): Secondary | ICD-10-CM | POA: Diagnosis not present

## 2018-11-21 DIAGNOSIS — I680 Cerebral amyloid angiopathy: Secondary | ICD-10-CM | POA: Diagnosis not present

## 2018-11-21 DIAGNOSIS — R2681 Unsteadiness on feet: Secondary | ICD-10-CM | POA: Diagnosis not present

## 2018-11-21 DIAGNOSIS — R278 Other lack of coordination: Secondary | ICD-10-CM | POA: Diagnosis not present

## 2018-11-21 DIAGNOSIS — I69951 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: Secondary | ICD-10-CM | POA: Diagnosis not present

## 2018-11-21 DIAGNOSIS — R41841 Cognitive communication deficit: Secondary | ICD-10-CM | POA: Diagnosis not present

## 2018-11-24 DIAGNOSIS — M6281 Muscle weakness (generalized): Secondary | ICD-10-CM | POA: Diagnosis not present

## 2018-11-24 DIAGNOSIS — I680 Cerebral amyloid angiopathy: Secondary | ICD-10-CM | POA: Diagnosis not present

## 2018-11-24 DIAGNOSIS — I69951 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: Secondary | ICD-10-CM | POA: Diagnosis not present

## 2018-11-24 DIAGNOSIS — R2681 Unsteadiness on feet: Secondary | ICD-10-CM | POA: Diagnosis not present

## 2018-11-24 DIAGNOSIS — R278 Other lack of coordination: Secondary | ICD-10-CM | POA: Diagnosis not present

## 2018-11-24 DIAGNOSIS — R41841 Cognitive communication deficit: Secondary | ICD-10-CM | POA: Diagnosis not present

## 2018-11-25 DIAGNOSIS — I680 Cerebral amyloid angiopathy: Secondary | ICD-10-CM | POA: Diagnosis not present

## 2018-11-25 DIAGNOSIS — R41841 Cognitive communication deficit: Secondary | ICD-10-CM | POA: Diagnosis not present

## 2018-11-25 DIAGNOSIS — R278 Other lack of coordination: Secondary | ICD-10-CM | POA: Diagnosis not present

## 2018-11-25 DIAGNOSIS — M6281 Muscle weakness (generalized): Secondary | ICD-10-CM | POA: Diagnosis not present

## 2018-11-25 DIAGNOSIS — I69951 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: Secondary | ICD-10-CM | POA: Diagnosis not present

## 2018-11-25 DIAGNOSIS — R2681 Unsteadiness on feet: Secondary | ICD-10-CM | POA: Diagnosis not present

## 2018-11-26 DIAGNOSIS — R2681 Unsteadiness on feet: Secondary | ICD-10-CM | POA: Diagnosis not present

## 2018-11-26 DIAGNOSIS — M6281 Muscle weakness (generalized): Secondary | ICD-10-CM | POA: Diagnosis not present

## 2018-11-26 DIAGNOSIS — I69951 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: Secondary | ICD-10-CM | POA: Diagnosis not present

## 2018-11-26 DIAGNOSIS — R41841 Cognitive communication deficit: Secondary | ICD-10-CM | POA: Diagnosis not present

## 2018-11-26 DIAGNOSIS — R278 Other lack of coordination: Secondary | ICD-10-CM | POA: Diagnosis not present

## 2018-11-26 DIAGNOSIS — I680 Cerebral amyloid angiopathy: Secondary | ICD-10-CM | POA: Diagnosis not present

## 2018-11-27 DIAGNOSIS — R41841 Cognitive communication deficit: Secondary | ICD-10-CM | POA: Diagnosis not present

## 2018-11-27 DIAGNOSIS — R2681 Unsteadiness on feet: Secondary | ICD-10-CM | POA: Diagnosis not present

## 2018-11-27 DIAGNOSIS — I69951 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: Secondary | ICD-10-CM | POA: Diagnosis not present

## 2018-11-27 DIAGNOSIS — M6281 Muscle weakness (generalized): Secondary | ICD-10-CM | POA: Diagnosis not present

## 2018-11-27 DIAGNOSIS — R4701 Aphasia: Secondary | ICD-10-CM | POA: Diagnosis not present

## 2018-11-27 DIAGNOSIS — R278 Other lack of coordination: Secondary | ICD-10-CM | POA: Diagnosis not present

## 2018-11-28 DIAGNOSIS — R278 Other lack of coordination: Secondary | ICD-10-CM | POA: Diagnosis not present

## 2018-11-28 DIAGNOSIS — R41841 Cognitive communication deficit: Secondary | ICD-10-CM | POA: Diagnosis not present

## 2018-11-28 DIAGNOSIS — M6281 Muscle weakness (generalized): Secondary | ICD-10-CM | POA: Diagnosis not present

## 2018-11-28 DIAGNOSIS — R4701 Aphasia: Secondary | ICD-10-CM | POA: Diagnosis not present

## 2018-11-28 DIAGNOSIS — I69951 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: Secondary | ICD-10-CM | POA: Diagnosis not present

## 2018-11-28 DIAGNOSIS — R2681 Unsteadiness on feet: Secondary | ICD-10-CM | POA: Diagnosis not present

## 2018-11-29 DIAGNOSIS — M6281 Muscle weakness (generalized): Secondary | ICD-10-CM | POA: Diagnosis not present

## 2018-11-29 DIAGNOSIS — I69951 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: Secondary | ICD-10-CM | POA: Diagnosis not present

## 2018-11-29 DIAGNOSIS — R41841 Cognitive communication deficit: Secondary | ICD-10-CM | POA: Diagnosis not present

## 2018-11-29 DIAGNOSIS — R4701 Aphasia: Secondary | ICD-10-CM | POA: Diagnosis not present

## 2018-11-29 DIAGNOSIS — R2681 Unsteadiness on feet: Secondary | ICD-10-CM | POA: Diagnosis not present

## 2018-11-29 DIAGNOSIS — R278 Other lack of coordination: Secondary | ICD-10-CM | POA: Diagnosis not present

## 2018-12-01 DIAGNOSIS — R41841 Cognitive communication deficit: Secondary | ICD-10-CM | POA: Diagnosis not present

## 2018-12-01 DIAGNOSIS — R2681 Unsteadiness on feet: Secondary | ICD-10-CM | POA: Diagnosis not present

## 2018-12-01 DIAGNOSIS — M6281 Muscle weakness (generalized): Secondary | ICD-10-CM | POA: Diagnosis not present

## 2018-12-01 DIAGNOSIS — R4701 Aphasia: Secondary | ICD-10-CM | POA: Diagnosis not present

## 2018-12-01 DIAGNOSIS — I69951 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: Secondary | ICD-10-CM | POA: Diagnosis not present

## 2018-12-01 DIAGNOSIS — R278 Other lack of coordination: Secondary | ICD-10-CM | POA: Diagnosis not present

## 2018-12-02 DIAGNOSIS — M79601 Pain in right arm: Secondary | ICD-10-CM | POA: Diagnosis not present

## 2018-12-02 DIAGNOSIS — R41841 Cognitive communication deficit: Secondary | ICD-10-CM | POA: Diagnosis not present

## 2018-12-02 DIAGNOSIS — R2681 Unsteadiness on feet: Secondary | ICD-10-CM | POA: Diagnosis not present

## 2018-12-02 DIAGNOSIS — I69951 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: Secondary | ICD-10-CM | POA: Diagnosis not present

## 2018-12-02 DIAGNOSIS — N9089 Other specified noninflammatory disorders of vulva and perineum: Secondary | ICD-10-CM | POA: Diagnosis not present

## 2018-12-02 DIAGNOSIS — R4701 Aphasia: Secondary | ICD-10-CM | POA: Diagnosis not present

## 2018-12-02 DIAGNOSIS — M6281 Muscle weakness (generalized): Secondary | ICD-10-CM | POA: Diagnosis not present

## 2018-12-02 DIAGNOSIS — R278 Other lack of coordination: Secondary | ICD-10-CM | POA: Diagnosis not present

## 2018-12-03 DIAGNOSIS — R2681 Unsteadiness on feet: Secondary | ICD-10-CM | POA: Diagnosis not present

## 2018-12-03 DIAGNOSIS — M6281 Muscle weakness (generalized): Secondary | ICD-10-CM | POA: Diagnosis not present

## 2018-12-03 DIAGNOSIS — R278 Other lack of coordination: Secondary | ICD-10-CM | POA: Diagnosis not present

## 2018-12-03 DIAGNOSIS — R41841 Cognitive communication deficit: Secondary | ICD-10-CM | POA: Diagnosis not present

## 2018-12-03 DIAGNOSIS — I69951 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: Secondary | ICD-10-CM | POA: Diagnosis not present

## 2018-12-03 DIAGNOSIS — R4701 Aphasia: Secondary | ICD-10-CM | POA: Diagnosis not present

## 2018-12-04 DIAGNOSIS — I69398 Other sequelae of cerebral infarction: Secondary | ICD-10-CM | POA: Diagnosis not present

## 2018-12-04 DIAGNOSIS — M79601 Pain in right arm: Secondary | ICD-10-CM | POA: Diagnosis not present

## 2018-12-04 DIAGNOSIS — R41841 Cognitive communication deficit: Secondary | ICD-10-CM | POA: Diagnosis not present

## 2018-12-04 DIAGNOSIS — M4800 Spinal stenosis, site unspecified: Secondary | ICD-10-CM | POA: Diagnosis not present

## 2018-12-04 DIAGNOSIS — R4701 Aphasia: Secondary | ICD-10-CM | POA: Diagnosis not present

## 2018-12-04 DIAGNOSIS — R2681 Unsteadiness on feet: Secondary | ICD-10-CM | POA: Diagnosis not present

## 2018-12-04 DIAGNOSIS — R569 Unspecified convulsions: Secondary | ICD-10-CM | POA: Diagnosis not present

## 2018-12-04 DIAGNOSIS — N9089 Other specified noninflammatory disorders of vulva and perineum: Secondary | ICD-10-CM | POA: Diagnosis not present

## 2018-12-04 DIAGNOSIS — R278 Other lack of coordination: Secondary | ICD-10-CM | POA: Diagnosis not present

## 2018-12-04 DIAGNOSIS — I69951 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: Secondary | ICD-10-CM | POA: Diagnosis not present

## 2018-12-04 DIAGNOSIS — M6281 Muscle weakness (generalized): Secondary | ICD-10-CM | POA: Diagnosis not present

## 2018-12-04 DIAGNOSIS — Z604 Social exclusion and rejection: Secondary | ICD-10-CM | POA: Diagnosis not present

## 2018-12-05 DIAGNOSIS — R41841 Cognitive communication deficit: Secondary | ICD-10-CM | POA: Diagnosis not present

## 2018-12-05 DIAGNOSIS — R2681 Unsteadiness on feet: Secondary | ICD-10-CM | POA: Diagnosis not present

## 2018-12-05 DIAGNOSIS — R278 Other lack of coordination: Secondary | ICD-10-CM | POA: Diagnosis not present

## 2018-12-05 DIAGNOSIS — I639 Cerebral infarction, unspecified: Secondary | ICD-10-CM | POA: Diagnosis not present

## 2018-12-05 DIAGNOSIS — I69398 Other sequelae of cerebral infarction: Secondary | ICD-10-CM | POA: Diagnosis not present

## 2018-12-05 DIAGNOSIS — M6281 Muscle weakness (generalized): Secondary | ICD-10-CM | POA: Diagnosis not present

## 2018-12-05 DIAGNOSIS — R4701 Aphasia: Secondary | ICD-10-CM | POA: Diagnosis not present

## 2018-12-05 DIAGNOSIS — R569 Unspecified convulsions: Secondary | ICD-10-CM | POA: Diagnosis not present

## 2018-12-05 DIAGNOSIS — M4800 Spinal stenosis, site unspecified: Secondary | ICD-10-CM | POA: Diagnosis not present

## 2018-12-05 DIAGNOSIS — I69951 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: Secondary | ICD-10-CM | POA: Diagnosis not present

## 2018-12-06 DIAGNOSIS — R41841 Cognitive communication deficit: Secondary | ICD-10-CM | POA: Diagnosis not present

## 2018-12-06 DIAGNOSIS — R278 Other lack of coordination: Secondary | ICD-10-CM | POA: Diagnosis not present

## 2018-12-06 DIAGNOSIS — M6281 Muscle weakness (generalized): Secondary | ICD-10-CM | POA: Diagnosis not present

## 2018-12-06 DIAGNOSIS — R4701 Aphasia: Secondary | ICD-10-CM | POA: Diagnosis not present

## 2018-12-06 DIAGNOSIS — R2681 Unsteadiness on feet: Secondary | ICD-10-CM | POA: Diagnosis not present

## 2018-12-06 DIAGNOSIS — I69951 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: Secondary | ICD-10-CM | POA: Diagnosis not present

## 2018-12-07 DIAGNOSIS — I69951 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: Secondary | ICD-10-CM | POA: Diagnosis not present

## 2018-12-07 DIAGNOSIS — M6281 Muscle weakness (generalized): Secondary | ICD-10-CM | POA: Diagnosis not present

## 2018-12-07 DIAGNOSIS — R2681 Unsteadiness on feet: Secondary | ICD-10-CM | POA: Diagnosis not present

## 2018-12-07 DIAGNOSIS — R4701 Aphasia: Secondary | ICD-10-CM | POA: Diagnosis not present

## 2018-12-07 DIAGNOSIS — R278 Other lack of coordination: Secondary | ICD-10-CM | POA: Diagnosis not present

## 2018-12-07 DIAGNOSIS — R41841 Cognitive communication deficit: Secondary | ICD-10-CM | POA: Diagnosis not present

## 2018-12-08 DIAGNOSIS — R2681 Unsteadiness on feet: Secondary | ICD-10-CM | POA: Diagnosis not present

## 2018-12-08 DIAGNOSIS — I69951 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: Secondary | ICD-10-CM | POA: Diagnosis not present

## 2018-12-08 DIAGNOSIS — M6281 Muscle weakness (generalized): Secondary | ICD-10-CM | POA: Diagnosis not present

## 2018-12-08 DIAGNOSIS — R4701 Aphasia: Secondary | ICD-10-CM | POA: Diagnosis not present

## 2018-12-08 DIAGNOSIS — R278 Other lack of coordination: Secondary | ICD-10-CM | POA: Diagnosis not present

## 2018-12-08 DIAGNOSIS — R41841 Cognitive communication deficit: Secondary | ICD-10-CM | POA: Diagnosis not present

## 2018-12-09 DIAGNOSIS — R41841 Cognitive communication deficit: Secondary | ICD-10-CM | POA: Diagnosis not present

## 2018-12-09 DIAGNOSIS — R278 Other lack of coordination: Secondary | ICD-10-CM | POA: Diagnosis not present

## 2018-12-09 DIAGNOSIS — R2681 Unsteadiness on feet: Secondary | ICD-10-CM | POA: Diagnosis not present

## 2018-12-09 DIAGNOSIS — M6281 Muscle weakness (generalized): Secondary | ICD-10-CM | POA: Diagnosis not present

## 2018-12-09 DIAGNOSIS — R4701 Aphasia: Secondary | ICD-10-CM | POA: Diagnosis not present

## 2018-12-09 DIAGNOSIS — I69951 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: Secondary | ICD-10-CM | POA: Diagnosis not present

## 2018-12-10 DIAGNOSIS — I69951 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: Secondary | ICD-10-CM | POA: Diagnosis not present

## 2018-12-10 DIAGNOSIS — R2681 Unsteadiness on feet: Secondary | ICD-10-CM | POA: Diagnosis not present

## 2018-12-10 DIAGNOSIS — R41841 Cognitive communication deficit: Secondary | ICD-10-CM | POA: Diagnosis not present

## 2018-12-10 DIAGNOSIS — R278 Other lack of coordination: Secondary | ICD-10-CM | POA: Diagnosis not present

## 2018-12-10 DIAGNOSIS — M6281 Muscle weakness (generalized): Secondary | ICD-10-CM | POA: Diagnosis not present

## 2018-12-10 DIAGNOSIS — R4701 Aphasia: Secondary | ICD-10-CM | POA: Diagnosis not present

## 2018-12-11 DIAGNOSIS — R4701 Aphasia: Secondary | ICD-10-CM | POA: Diagnosis not present

## 2018-12-11 DIAGNOSIS — R2681 Unsteadiness on feet: Secondary | ICD-10-CM | POA: Diagnosis not present

## 2018-12-11 DIAGNOSIS — R41841 Cognitive communication deficit: Secondary | ICD-10-CM | POA: Diagnosis not present

## 2018-12-11 DIAGNOSIS — R278 Other lack of coordination: Secondary | ICD-10-CM | POA: Diagnosis not present

## 2018-12-11 DIAGNOSIS — M6281 Muscle weakness (generalized): Secondary | ICD-10-CM | POA: Diagnosis not present

## 2018-12-11 DIAGNOSIS — I69951 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: Secondary | ICD-10-CM | POA: Diagnosis not present

## 2018-12-12 DIAGNOSIS — R2681 Unsteadiness on feet: Secondary | ICD-10-CM | POA: Diagnosis not present

## 2018-12-12 DIAGNOSIS — R4701 Aphasia: Secondary | ICD-10-CM | POA: Diagnosis not present

## 2018-12-12 DIAGNOSIS — I69951 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: Secondary | ICD-10-CM | POA: Diagnosis not present

## 2018-12-12 DIAGNOSIS — M6281 Muscle weakness (generalized): Secondary | ICD-10-CM | POA: Diagnosis not present

## 2018-12-12 DIAGNOSIS — R278 Other lack of coordination: Secondary | ICD-10-CM | POA: Diagnosis not present

## 2018-12-12 DIAGNOSIS — R41841 Cognitive communication deficit: Secondary | ICD-10-CM | POA: Diagnosis not present

## 2018-12-13 DIAGNOSIS — R2681 Unsteadiness on feet: Secondary | ICD-10-CM | POA: Diagnosis not present

## 2018-12-13 DIAGNOSIS — R278 Other lack of coordination: Secondary | ICD-10-CM | POA: Diagnosis not present

## 2018-12-13 DIAGNOSIS — M6281 Muscle weakness (generalized): Secondary | ICD-10-CM | POA: Diagnosis not present

## 2018-12-13 DIAGNOSIS — R4701 Aphasia: Secondary | ICD-10-CM | POA: Diagnosis not present

## 2018-12-13 DIAGNOSIS — R41841 Cognitive communication deficit: Secondary | ICD-10-CM | POA: Diagnosis not present

## 2018-12-13 DIAGNOSIS — I69951 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: Secondary | ICD-10-CM | POA: Diagnosis not present

## 2018-12-14 DIAGNOSIS — G4700 Insomnia, unspecified: Secondary | ICD-10-CM | POA: Diagnosis not present

## 2018-12-14 DIAGNOSIS — R2681 Unsteadiness on feet: Secondary | ICD-10-CM | POA: Diagnosis not present

## 2018-12-14 DIAGNOSIS — R278 Other lack of coordination: Secondary | ICD-10-CM | POA: Diagnosis not present

## 2018-12-14 DIAGNOSIS — I615 Nontraumatic intracerebral hemorrhage, intraventricular: Secondary | ICD-10-CM | POA: Diagnosis not present

## 2018-12-14 DIAGNOSIS — I639 Cerebral infarction, unspecified: Secondary | ICD-10-CM | POA: Diagnosis not present

## 2018-12-14 DIAGNOSIS — R41841 Cognitive communication deficit: Secondary | ICD-10-CM | POA: Diagnosis not present

## 2018-12-14 DIAGNOSIS — F330 Major depressive disorder, recurrent, mild: Secondary | ICD-10-CM | POA: Diagnosis not present

## 2018-12-14 DIAGNOSIS — R4701 Aphasia: Secondary | ICD-10-CM | POA: Diagnosis not present

## 2018-12-14 DIAGNOSIS — M6281 Muscle weakness (generalized): Secondary | ICD-10-CM | POA: Diagnosis not present

## 2018-12-14 DIAGNOSIS — G629 Polyneuropathy, unspecified: Secondary | ICD-10-CM | POA: Diagnosis not present

## 2018-12-14 DIAGNOSIS — I69951 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: Secondary | ICD-10-CM | POA: Diagnosis not present

## 2018-12-14 DIAGNOSIS — I680 Cerebral amyloid angiopathy: Secondary | ICD-10-CM | POA: Diagnosis not present

## 2018-12-14 DIAGNOSIS — R569 Unspecified convulsions: Secondary | ICD-10-CM | POA: Diagnosis not present

## 2018-12-14 DIAGNOSIS — G6289 Other specified polyneuropathies: Secondary | ICD-10-CM | POA: Diagnosis not present

## 2018-12-15 DIAGNOSIS — R404 Transient alteration of awareness: Secondary | ICD-10-CM | POA: Diagnosis not present

## 2018-12-15 DIAGNOSIS — N183 Chronic kidney disease, stage 3 (moderate): Secondary | ICD-10-CM | POA: Diagnosis not present

## 2018-12-15 DIAGNOSIS — Z8673 Personal history of transient ischemic attack (TIA), and cerebral infarction without residual deficits: Secondary | ICD-10-CM | POA: Diagnosis not present

## 2018-12-15 DIAGNOSIS — R52 Pain, unspecified: Secondary | ICD-10-CM | POA: Diagnosis not present

## 2018-12-15 DIAGNOSIS — R531 Weakness: Secondary | ICD-10-CM | POA: Diagnosis not present

## 2018-12-15 DIAGNOSIS — R4701 Aphasia: Secondary | ICD-10-CM | POA: Diagnosis not present

## 2018-12-15 DIAGNOSIS — I6389 Other cerebral infarction: Secondary | ICD-10-CM | POA: Diagnosis not present

## 2018-12-15 DIAGNOSIS — E162 Hypoglycemia, unspecified: Secondary | ICD-10-CM | POA: Diagnosis not present

## 2018-12-15 DIAGNOSIS — R293 Abnormal posture: Secondary | ICD-10-CM | POA: Diagnosis not present

## 2018-12-15 DIAGNOSIS — R279 Unspecified lack of coordination: Secondary | ICD-10-CM | POA: Diagnosis not present

## 2018-12-15 DIAGNOSIS — E871 Hypo-osmolality and hyponatremia: Secondary | ICD-10-CM | POA: Diagnosis not present

## 2018-12-15 DIAGNOSIS — R4182 Altered mental status, unspecified: Secondary | ICD-10-CM | POA: Diagnosis not present

## 2018-12-15 DIAGNOSIS — N281 Cyst of kidney, acquired: Secondary | ICD-10-CM | POA: Diagnosis not present

## 2018-12-15 DIAGNOSIS — G4089 Other seizures: Secondary | ICD-10-CM | POA: Diagnosis not present

## 2018-12-15 DIAGNOSIS — I69951 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: Secondary | ICD-10-CM | POA: Diagnosis not present

## 2018-12-15 DIAGNOSIS — R2681 Unsteadiness on feet: Secondary | ICD-10-CM | POA: Diagnosis not present

## 2018-12-15 DIAGNOSIS — G40109 Localization-related (focal) (partial) symptomatic epilepsy and epileptic syndromes with simple partial seizures, not intractable, without status epilepticus: Secondary | ICD-10-CM | POA: Diagnosis not present

## 2018-12-15 DIAGNOSIS — Z743 Need for continuous supervision: Secondary | ICD-10-CM | POA: Diagnosis not present

## 2018-12-15 DIAGNOSIS — G9349 Other encephalopathy: Secondary | ICD-10-CM | POA: Diagnosis not present

## 2018-12-15 DIAGNOSIS — R41841 Cognitive communication deficit: Secondary | ICD-10-CM | POA: Diagnosis not present

## 2018-12-15 DIAGNOSIS — R410 Disorientation, unspecified: Secondary | ICD-10-CM | POA: Diagnosis not present

## 2018-12-15 DIAGNOSIS — I129 Hypertensive chronic kidney disease with stage 1 through stage 4 chronic kidney disease, or unspecified chronic kidney disease: Secondary | ICD-10-CM | POA: Diagnosis not present

## 2018-12-15 DIAGNOSIS — M6281 Muscle weakness (generalized): Secondary | ICD-10-CM | POA: Diagnosis not present

## 2018-12-15 DIAGNOSIS — U071 COVID-19: Secondary | ICD-10-CM | POA: Diagnosis not present

## 2018-12-15 DIAGNOSIS — G459 Transient cerebral ischemic attack, unspecified: Secondary | ICD-10-CM | POA: Diagnosis not present

## 2018-12-15 DIAGNOSIS — I498 Other specified cardiac arrhythmias: Secondary | ICD-10-CM | POA: Diagnosis not present

## 2018-12-15 DIAGNOSIS — I69351 Hemiplegia and hemiparesis following cerebral infarction affecting right dominant side: Secondary | ICD-10-CM | POA: Diagnosis not present

## 2018-12-15 DIAGNOSIS — E119 Type 2 diabetes mellitus without complications: Secondary | ICD-10-CM | POA: Diagnosis not present

## 2018-12-15 DIAGNOSIS — N3000 Acute cystitis without hematuria: Secondary | ICD-10-CM | POA: Diagnosis not present

## 2018-12-15 DIAGNOSIS — E161 Other hypoglycemia: Secondary | ICD-10-CM | POA: Diagnosis not present

## 2018-12-15 DIAGNOSIS — R278 Other lack of coordination: Secondary | ICD-10-CM | POA: Diagnosis not present

## 2018-12-15 DIAGNOSIS — I2699 Other pulmonary embolism without acute cor pulmonale: Secondary | ICD-10-CM | POA: Diagnosis not present

## 2018-12-19 DIAGNOSIS — N281 Cyst of kidney, acquired: Secondary | ICD-10-CM | POA: Diagnosis not present

## 2018-12-19 DIAGNOSIS — R278 Other lack of coordination: Secondary | ICD-10-CM | POA: Diagnosis not present

## 2018-12-19 DIAGNOSIS — I10 Essential (primary) hypertension: Secondary | ICD-10-CM | POA: Diagnosis not present

## 2018-12-19 DIAGNOSIS — G4089 Other seizures: Secondary | ICD-10-CM | POA: Diagnosis not present

## 2018-12-19 DIAGNOSIS — I618 Other nontraumatic intracerebral hemorrhage: Secondary | ICD-10-CM | POA: Diagnosis not present

## 2018-12-19 DIAGNOSIS — B349 Viral infection, unspecified: Secondary | ICD-10-CM | POA: Diagnosis not present

## 2018-12-19 DIAGNOSIS — R41841 Cognitive communication deficit: Secondary | ICD-10-CM | POA: Diagnosis not present

## 2018-12-19 DIAGNOSIS — G4700 Insomnia, unspecified: Secondary | ICD-10-CM | POA: Diagnosis not present

## 2018-12-19 DIAGNOSIS — G9341 Metabolic encephalopathy: Secondary | ICD-10-CM | POA: Diagnosis not present

## 2018-12-19 DIAGNOSIS — F330 Major depressive disorder, recurrent, mild: Secondary | ICD-10-CM | POA: Diagnosis not present

## 2018-12-19 DIAGNOSIS — U071 COVID-19: Secondary | ICD-10-CM | POA: Diagnosis not present

## 2018-12-19 DIAGNOSIS — I680 Cerebral amyloid angiopathy: Secondary | ICD-10-CM | POA: Diagnosis not present

## 2018-12-19 DIAGNOSIS — G6289 Other specified polyneuropathies: Secondary | ICD-10-CM | POA: Diagnosis not present

## 2018-12-19 DIAGNOSIS — F0631 Mood disorder due to known physiological condition with depressive features: Secondary | ICD-10-CM | POA: Diagnosis not present

## 2018-12-19 DIAGNOSIS — R404 Transient alteration of awareness: Secondary | ICD-10-CM | POA: Diagnosis not present

## 2018-12-19 DIAGNOSIS — I129 Hypertensive chronic kidney disease with stage 1 through stage 4 chronic kidney disease, or unspecified chronic kidney disease: Secondary | ICD-10-CM | POA: Diagnosis not present

## 2018-12-19 DIAGNOSIS — I639 Cerebral infarction, unspecified: Secondary | ICD-10-CM | POA: Diagnosis not present

## 2018-12-19 DIAGNOSIS — R569 Unspecified convulsions: Secondary | ICD-10-CM | POA: Diagnosis not present

## 2018-12-19 DIAGNOSIS — G459 Transient cerebral ischemic attack, unspecified: Secondary | ICD-10-CM | POA: Diagnosis not present

## 2018-12-19 DIAGNOSIS — R279 Unspecified lack of coordination: Secondary | ICD-10-CM | POA: Diagnosis not present

## 2018-12-19 DIAGNOSIS — E039 Hypothyroidism, unspecified: Secondary | ICD-10-CM | POA: Diagnosis not present

## 2018-12-19 DIAGNOSIS — I69951 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: Secondary | ICD-10-CM | POA: Diagnosis not present

## 2018-12-19 DIAGNOSIS — G8191 Hemiplegia, unspecified affecting right dominant side: Secondary | ICD-10-CM | POA: Diagnosis not present

## 2018-12-19 DIAGNOSIS — R4182 Altered mental status, unspecified: Secondary | ICD-10-CM | POA: Diagnosis not present

## 2018-12-19 DIAGNOSIS — R293 Abnormal posture: Secondary | ICD-10-CM | POA: Diagnosis not present

## 2018-12-19 DIAGNOSIS — M4800 Spinal stenosis, site unspecified: Secondary | ICD-10-CM | POA: Diagnosis not present

## 2018-12-19 DIAGNOSIS — R1319 Other dysphagia: Secondary | ICD-10-CM | POA: Diagnosis not present

## 2018-12-19 DIAGNOSIS — Z743 Need for continuous supervision: Secondary | ICD-10-CM | POA: Diagnosis not present

## 2018-12-19 DIAGNOSIS — E119 Type 2 diabetes mellitus without complications: Secondary | ICD-10-CM | POA: Diagnosis not present

## 2018-12-19 DIAGNOSIS — G629 Polyneuropathy, unspecified: Secondary | ICD-10-CM | POA: Diagnosis not present

## 2018-12-19 DIAGNOSIS — I615 Nontraumatic intracerebral hemorrhage, intraventricular: Secondary | ICD-10-CM | POA: Diagnosis not present

## 2018-12-19 DIAGNOSIS — R531 Weakness: Secondary | ICD-10-CM | POA: Diagnosis not present

## 2018-12-19 DIAGNOSIS — E11649 Type 2 diabetes mellitus with hypoglycemia without coma: Secondary | ICD-10-CM | POA: Diagnosis not present

## 2018-12-19 DIAGNOSIS — M6281 Muscle weakness (generalized): Secondary | ICD-10-CM | POA: Diagnosis not present

## 2018-12-19 DIAGNOSIS — R2681 Unsteadiness on feet: Secondary | ICD-10-CM | POA: Diagnosis not present

## 2018-12-19 DIAGNOSIS — N183 Chronic kidney disease, stage 3 (moderate): Secondary | ICD-10-CM | POA: Diagnosis not present

## 2018-12-19 DIAGNOSIS — R4701 Aphasia: Secondary | ICD-10-CM | POA: Diagnosis not present

## 2018-12-19 DIAGNOSIS — Z79899 Other long term (current) drug therapy: Secondary | ICD-10-CM | POA: Diagnosis not present

## 2018-12-19 DIAGNOSIS — I69398 Other sequelae of cerebral infarction: Secondary | ICD-10-CM | POA: Diagnosis not present

## 2018-12-19 DIAGNOSIS — D508 Other iron deficiency anemias: Secondary | ICD-10-CM | POA: Diagnosis not present

## 2018-12-19 MED ORDER — PHENYTOIN SODIUM EXTENDED 100 MG PO CAPS
200.00 | ORAL_CAPSULE | ORAL | Status: DC
Start: 2018-12-19 — End: 2018-12-19

## 2018-12-19 MED ORDER — SENNOSIDES-DOCUSATE SODIUM 8.6-50 MG PO TABS
1.00 | ORAL_TABLET | ORAL | Status: DC
Start: 2018-12-19 — End: 2018-12-19

## 2018-12-19 MED ORDER — HYDROCHLOROTHIAZIDE 12.5 MG PO CAPS
12.50 | ORAL_CAPSULE | ORAL | Status: DC
Start: 2018-12-20 — End: 2018-12-19

## 2018-12-19 MED ORDER — ENOXAPARIN SODIUM 40 MG/0.4ML ~~LOC~~ SOLN
40.00 | SUBCUTANEOUS | Status: DC
Start: 2018-12-19 — End: 2018-12-19

## 2018-12-19 MED ORDER — MAGNESIUM OXIDE 400 MG PO TABS
400.00 | ORAL_TABLET | ORAL | Status: DC
Start: 2018-12-19 — End: 2018-12-19

## 2018-12-19 MED ORDER — LEVOTHYROXINE SODIUM 88 MCG PO TABS
88.00 | ORAL_TABLET | ORAL | Status: DC
Start: 2018-12-20 — End: 2018-12-19

## 2018-12-19 MED ORDER — POLYETHYLENE GLYCOL 3350 17 G PO PACK
17.00 | PACK | ORAL | Status: DC
Start: ? — End: 2018-12-19

## 2018-12-19 MED ORDER — LOSARTAN POTASSIUM 50 MG PO TABS
50.00 | ORAL_TABLET | ORAL | Status: DC
Start: 2018-12-19 — End: 2018-12-19

## 2018-12-19 MED ORDER — GENERIC EXTERNAL MEDICATION
1250.00 | Status: DC
Start: 2018-12-19 — End: 2018-12-19

## 2018-12-19 MED ORDER — LEVETIRACETAM 500 MG PO TABS
2000.00 | ORAL_TABLET | ORAL | Status: DC
Start: 2018-12-19 — End: 2018-12-19

## 2018-12-19 MED ORDER — PANTOPRAZOLE SODIUM 40 MG PO TBEC
40.00 | DELAYED_RELEASE_TABLET | ORAL | Status: DC
Start: 2018-12-20 — End: 2018-12-19

## 2018-12-19 MED ORDER — GABAPENTIN 300 MG PO CAPS
300.00 | ORAL_CAPSULE | ORAL | Status: DC
Start: 2018-12-19 — End: 2018-12-19

## 2018-12-19 MED ORDER — ACETAMINOPHEN 325 MG PO TABS
650.00 | ORAL_TABLET | ORAL | Status: DC
Start: ? — End: 2018-12-19

## 2018-12-19 MED ORDER — VENLAFAXINE HCL ER 37.5 MG PO CP24
75.00 | ORAL_CAPSULE | ORAL | Status: DC
Start: 2018-12-20 — End: 2018-12-19

## 2018-12-22 DIAGNOSIS — N281 Cyst of kidney, acquired: Secondary | ICD-10-CM | POA: Diagnosis not present

## 2018-12-22 DIAGNOSIS — U071 COVID-19: Secondary | ICD-10-CM | POA: Diagnosis not present

## 2018-12-22 DIAGNOSIS — E11649 Type 2 diabetes mellitus with hypoglycemia without coma: Secondary | ICD-10-CM | POA: Diagnosis not present

## 2018-12-22 DIAGNOSIS — G9341 Metabolic encephalopathy: Secondary | ICD-10-CM | POA: Diagnosis not present

## 2018-12-22 DIAGNOSIS — G8191 Hemiplegia, unspecified affecting right dominant side: Secondary | ICD-10-CM | POA: Diagnosis not present

## 2018-12-22 DIAGNOSIS — I618 Other nontraumatic intracerebral hemorrhage: Secondary | ICD-10-CM | POA: Diagnosis not present

## 2018-12-22 DIAGNOSIS — G4089 Other seizures: Secondary | ICD-10-CM | POA: Diagnosis not present

## 2019-01-25 DIAGNOSIS — I639 Cerebral infarction, unspecified: Secondary | ICD-10-CM | POA: Diagnosis not present

## 2019-01-25 DIAGNOSIS — R4701 Aphasia: Secondary | ICD-10-CM | POA: Diagnosis not present

## 2019-01-25 DIAGNOSIS — G629 Polyneuropathy, unspecified: Secondary | ICD-10-CM | POA: Diagnosis not present

## 2019-01-25 DIAGNOSIS — R569 Unspecified convulsions: Secondary | ICD-10-CM | POA: Diagnosis not present

## 2019-01-25 DIAGNOSIS — I69951 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: Secondary | ICD-10-CM | POA: Diagnosis not present

## 2019-01-25 DIAGNOSIS — I680 Cerebral amyloid angiopathy: Secondary | ICD-10-CM | POA: Diagnosis not present

## 2019-01-25 DIAGNOSIS — I615 Nontraumatic intracerebral hemorrhage, intraventricular: Secondary | ICD-10-CM | POA: Diagnosis not present

## 2019-01-25 DIAGNOSIS — G4700 Insomnia, unspecified: Secondary | ICD-10-CM | POA: Diagnosis not present

## 2019-01-25 DIAGNOSIS — F330 Major depressive disorder, recurrent, mild: Secondary | ICD-10-CM | POA: Diagnosis not present

## 2019-01-26 DIAGNOSIS — U071 COVID-19: Secondary | ICD-10-CM | POA: Diagnosis not present

## 2019-01-26 DIAGNOSIS — R1319 Other dysphagia: Secondary | ICD-10-CM | POA: Diagnosis not present

## 2019-01-26 DIAGNOSIS — F0631 Mood disorder due to known physiological condition with depressive features: Secondary | ICD-10-CM | POA: Diagnosis not present

## 2019-01-26 DIAGNOSIS — I618 Other nontraumatic intracerebral hemorrhage: Secondary | ICD-10-CM | POA: Diagnosis not present

## 2019-01-26 DIAGNOSIS — I10 Essential (primary) hypertension: Secondary | ICD-10-CM | POA: Diagnosis not present

## 2019-01-26 DIAGNOSIS — G6289 Other specified polyneuropathies: Secondary | ICD-10-CM | POA: Diagnosis not present

## 2019-01-26 DIAGNOSIS — G4089 Other seizures: Secondary | ICD-10-CM | POA: Diagnosis not present

## 2019-02-05 ENCOUNTER — Other Ambulatory Visit: Payer: Self-pay

## 2019-02-05 NOTE — Patient Outreach (Signed)
[facility]) Care Management  02/05/2019  COLEY LITTLES [birth year] [id number]     Transition of Care Referral  Referral Date: 02/05/2019 Referral Source: [facility] Discharge Report Date of Admission: unknown Diagnosis: unknown Date of Discharge: 02/04/2019 Facility: [facility] [place]: [facility]    Outreach attempt # 1 to patient.  Spoke with daughter-Charrise(DPR on file). She states that patient is resident at [facility]. She voices she originally went for rehab short stay but will remain there for long term care and is getting Medicaid.   Plan: RN CM will close case as patient resident at [facility] facility.   Enzo Montgomery, RN,BSN,CCM [facility] Telephonic Care Management Coordinator Direct Phone: [phone] Toll Free: [phone] Fax: [phone]

## 2019-05-09 DIAGNOSIS — U071 COVID-19: Secondary | ICD-10-CM | POA: Diagnosis not present

## 2019-05-09 DIAGNOSIS — R278 Other lack of coordination: Secondary | ICD-10-CM | POA: Diagnosis not present

## 2019-05-09 DIAGNOSIS — R4701 Aphasia: Secondary | ICD-10-CM | POA: Diagnosis not present

## 2019-05-09 DIAGNOSIS — R293 Abnormal posture: Secondary | ICD-10-CM | POA: Diagnosis not present

## 2019-05-09 DIAGNOSIS — I69951 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: Secondary | ICD-10-CM | POA: Diagnosis not present

## 2019-05-09 DIAGNOSIS — J45909 Unspecified asthma, uncomplicated: Secondary | ICD-10-CM | POA: Diagnosis not present

## 2019-05-09 DIAGNOSIS — R2681 Unsteadiness on feet: Secondary | ICD-10-CM | POA: Diagnosis not present

## 2019-05-09 DIAGNOSIS — R41841 Cognitive communication deficit: Secondary | ICD-10-CM | POA: Diagnosis not present

## 2019-05-09 DIAGNOSIS — M6281 Muscle weakness (generalized): Secondary | ICD-10-CM | POA: Diagnosis not present

## 2019-05-26 NOTE — Progress Notes (Deleted)
Subjective:   Kathleen Jensen is a 68 y.o. female who presents for Medicare Annual (Subsequent) preventive examination.  Review of Systems:  Home Safety/Smoke Alarms: Feels safe in home. Smoke alarms in place.     Female:         Mammo-       Dexa scan-        CCS- 07/29/13. Pt reported normal     Objective:     Vitals: There were no vitals taken for this visit.  There is no height or weight on file to calculate BMI.  Advanced Directives 05/25/2018 12/02/2017 12/01/2017 10/05/2017 04/28/2017  Does Patient Have a Medical Advance Directive? (No Data) No No No No  Would patient like information on creating a medical advance directive? - Yes (MAU/Ambulatory/Procedural Areas - Information given) - - Yes (MAU/Ambulatory/Procedural Areas - Information given)    Tobacco Social History   Tobacco Use  Smoking Status Former Smoker  . Years: 4.00  . Quit date: 07/30/1979  . Years since quitting: 39.8  Smokeless Tobacco Never Used     Counseling given: Not Answered   Clinical Intake:                       Past Medical History:  Diagnosis Date  . Aphasia   . Arthritis   . Asthma   . Cardiac pacemaker   . Cerebral amyloid angiopathy (CODE)   . CKD (chronic kidney disease)   . Cognitive communication deficit   . 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 GERD (gastroesophageal reflux disease)   . 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 Hyperlipemia   . Hypertension   . Insomnia   . Intracerebral hemorrhage, intraventricular (HCC)   . Muscle weakness   . Other abnormalities of gait and mobility   . Seizures (HCC)   . Stroke (HCC)   . Unsteadiness on feet   . Vitamin B deficiency    Past Surgical History:  Procedure Laterality Date  . BACK SURGERY     Dec 18, 2016 Dr.Torrealba  . BREAST BIOPSY Right   . BREAST EXCISIONAL BIOPSY    . CERVICAL SPINE  SURGERY     C4  . CESAREAN SECTION    . GASTRIC BYPASS  03/2007  . TONSILLECTOMY     Family History  Problem Relation Age of Onset  . High blood pressure Mother   . Diabetes Mother   . Diabetes Father   . Stroke Father   . Cancer Sister        Colorectal cancer  . Cancer Brother        colon   Social History   Socioeconomic History  . Marital status: Divorced    Spouse name: Not on file  . Number of children: 3  . Years of education: 42yrs  . Highest education level: Not on file  Occupational History  . 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 Financial resource strain: Not on file  . Food insecurity    Worry: Not on file    Inability: Not on file  . Transportation needs    Medical: Not on file    Non-medical: Not on file  Tobacco Use  . Smoking status: Former Smoker    Years: 4.00    Quit date: 07/30/1979  Years since quitting: 39.8  . Smokeless tobacco: Never Used  Substance and Sexual Activity  . Alcohol use: No    Alcohol/week: 0.0 standard drinks  . Drug use: No  . Sexual activity: Never    Partners: Male  Lifestyle  . Physical activity    Days per week: Not on file    Minutes per session: Not on file  . Stress: Not on file  Relationships  . Social Herbalist on phone: Not on file    Gets together: Not on file    Attends religious service: Not on file    Active member of club or organization: Not on file    Attends meetings of clubs or organizations: Not on file    Relationship status: Not on file  Other Topics Concern  . Not on file  Social History Narrative   Patient lives at home alone.   Caffeine Use: 1 cup daily   1 dog    Outpatient Encounter Medications as of 05/27/2019  Medication Sig  . acetaminophen (TYLENOL) 325 MG tablet Take 1 tablet by mouth every 8 (eight) hours as needed.  Marland Kitchen albuterol (PROVENTIL HFA;VENTOLIN HFA) 108 (90 Base) MCG/ACT inhaler Inhale 2 puffs into  the lungs every 6 (six) hours as needed for wheezing or shortness of breath.  . calcium carbonate (OS-CAL - DOSED IN MG OF ELEMENTAL CALCIUM) 1250 (500 Ca) MG tablet 1 tablet daily.  Marland Kitchen gabapentin (NEURONTIN) 300 MG capsule Take 1 capsule by mouth 3 (three) times daily.  . hydrochlorothiazide (MICROZIDE) 12.5 MG capsule TAKE 1 CAPSULE (12.5 MG TOTAL) BY MOUTH DAILY.  Marland Kitchen levETIRAcetam (KEPPRA) 1000 MG tablet 2 po bid  . levothyroxine (SYNTHROID, LEVOTHROID) 88 MCG tablet Take 1 tablet (88 mcg total) by mouth daily before breakfast.  . losartan (COZAAR) 100 MG tablet Take 1 tablet (100 mg total) by mouth daily.  . magnesium oxide (MAG-OX) 400 MG tablet Take 400 mg by mouth 2 (two) times daily.  . mirtazapine (REMERON) 30 MG tablet Take 1 tablet (30 mg total) by mouth at bedtime.  . Misc. Devices (TRANSFER BOARD) MISC 1 each by Does not apply route as needed (For Bed Transfer).  . pantoprazole (PROTONIX) 40 MG tablet Take 1 tablet (40 mg total) by mouth daily.  . phenytoin (DILANTIN) 50 MG tablet 200mg  in the morning and 200mg  in the evening  . polyethylene glycol (MIRALAX / GLYCOLAX) packet Take 17 g by mouth daily as needed.   . senna-docusate (SENOKOT-S) 8.6-50 MG tablet Take 1 tablet by mouth 2 (two) times daily.  Marland Kitchen tiZANidine (ZANAFLEX) 4 MG tablet Take 1 tablet (4 mg total) by mouth every 6 (six) hours as needed for muscle spasms.  . valACYclovir (VALTREX) 1000 MG tablet Take 1 tablet (1,000 mg total) by mouth 3 (three) times daily.  Marland Kitchen warfarin (COUMADIN) 5 MG tablet 10 mg on mon and 5 mg all other days (Patient taking differently: TAKE AS DIRECTED BY COUMADIN CLINIC)   No facility-administered encounter medications on file as of 05/27/2019.     Activities of Daily Living No flowsheet data found.  Patient Care Team: Carollee Herter, Alferd Apa, DO as PCP - General (Family Medicine) Luellen Pucker, MD as Consulting Physician (Obstetrics and Gynecology) Darrick Penna. Wilford Grist, MD as Consulting  Physician (Pain Medicine) Matilde Bash, MD as Referring Physician (Neurology)    Assessment:   This is a routine wellness examination for Kathleen Jensen. Physical assessment deferred to PCP.  Exercise Activities and Dietary  recommendations   Diet (meal preparation, eat out, water intake, caffeinated beverages, dairy products, fruits and vegetables): {Desc; diets:16563} Breakfast: Lunch:  Dinner:      Goals    . Patient Stated     Would like for her memory to improve.       Fall Risk Fall Risk  05/25/2018 12/02/2017 04/28/2017 04/29/2016 01/26/2016  Falls in the past year? Yes No Yes Yes Yes  Number falls in past yr: 2 or more - 2 or more 1 1  Comment - - - [place]  Injury with Fall? Yes - No No -  Risk for fall due to : Mental status change;History of fall(s);Impaired balance/gait Impaired balance/gait;Impaired mobility - - -  Follow up Education provided;Falls prevention discussed - Education provided;Falls prevention discussed - -   Depression Screen PHQ 2/9 Scores 05/25/2018 12/02/2017 04/28/2017 04/29/2016  PHQ - 2 Score 0 0 0 0     Cognitive Function Ad8 score reviewed for issues:  Issues making decisions:  Less interest in hobbies / activities:  Repeats questions, stories (family complaining):  Trouble using ordinary gadgets (microwave, computer, phone):  Forgets the month or year:   Mismanaging finances:   Remembering appts:  Daily problems with thinking and/or memory: Ad8 score is=  MMSE - Mini Mental State Exam 05/25/2018 04/28/2017  Not completed: Unable to complete -  Orientation to time - 5  Orientation to Place - 5  Registration - 3  Attention/ Calculation - 5  Recall - 3  Language- name 2 objects - 2  Language- repeat - 1  Language- follow 3 step command - 3  Language- read & follow direction - 1  Write a sentence - 1  Copy design - 1  Total score - 30        Immunization History  Administered Date(s)  Administered  . Influenza Whole 05/04/2010  . Influenza, High Dose Seasonal PF 04/29/2016  . Influenza-Unspecified 04/29/2015, 04/19/2017  . Pneumococcal Conjugate-13 07/30/2018  . Pneumococcal-Unspecified 07/29/2013  . Td 07/30/2007    Screening Tests Health Maintenance  Topic Date Due  . Hepatitis C Screening  August 21, 1950  . FOOT EXAM  08/12/1960  . OPHTHALMOLOGY EXAM  08/12/1960  . TETANUS/TDAP  07/29/2017  . COLONOSCOPY  07/29/2018  . HEMOGLOBIN A1C  01/28/2019  . INFLUENZA VACCINE  02/27/2019  . PNA vac Low Risk Adult (2 of 2 - PPSV23) 07/31/2019  . MAMMOGRAM  05/19/2020  . DEXA SCAN  Completed     Plan:   ***   I have personally reviewed and noted the following in the patient's chart:   . Medical and social history . Use of alcohol, tobacco or illicit drugs  . Current medications and supplements . Functional ability and status . Nutritional status . Physical activity . Advanced directives . List of other physicians . Hospitalizations, surgeries, and ER visits in previous 12 months . Vitals . Screenings to include cognitive, depression, and falls . Referrals and appointments  In addition, I have reviewed and discussed with patient certain preventive protocols, quality metrics, and best practice recommendations. A written personalized care plan for preventive services as well as general preventive health recommendations were provided to patient.     Avon GullyBritt, Gaspar Fowle Angel, [place]  05/26/2019

## 2019-05-27 ENCOUNTER — Ambulatory Visit: Payer: Medicare HMO | Admitting: *Deleted

## 2019-06-21 IMAGING — CT CT HEAD CODE STROKE
3 series · 14 of 47 positions shown, 16 images · non-contrast
Comparison: 01/31/2005

CLINICAL DATA: Code stroke. Slurred speech. Prior stroke with
residual right-sided deficits.

EXAM:
CT HEAD WITHOUT CONTRAST
TECHNIQUE: Contiguous axial images were obtained from the base of the skull
through the vertex without intravenous contrast.

[Series 2: head wo · axial · 0.49mm/px · z∈[-162,-27]mm · 8 of 33 slices shown, 10 images]
[im 3/33  brain]
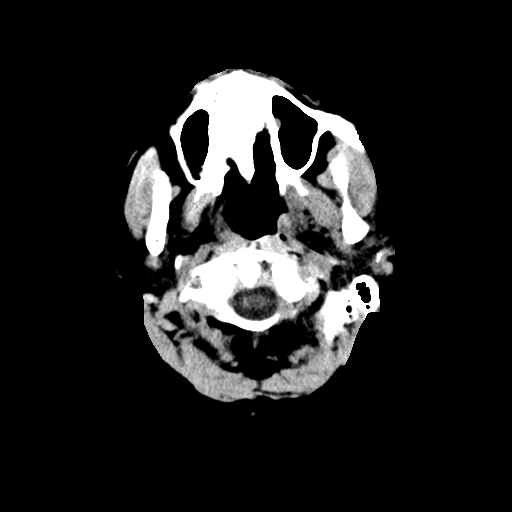
[im 3/33  bone]
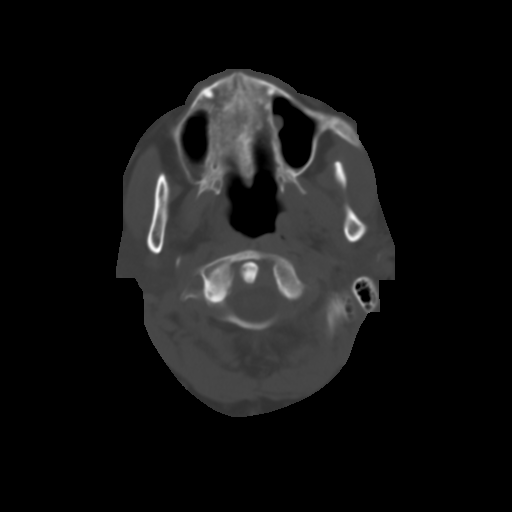
[im 7/33  brain]
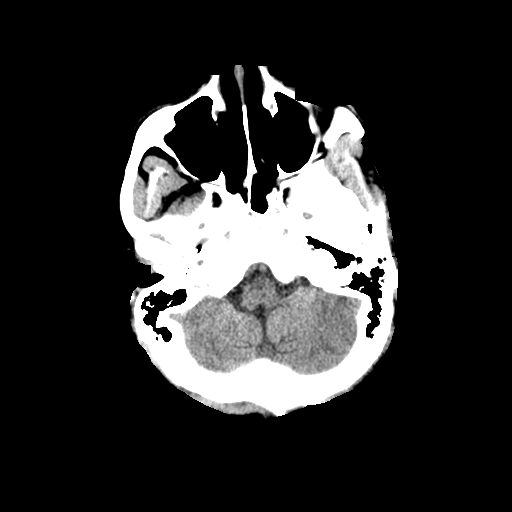
[im 10/33  brain]
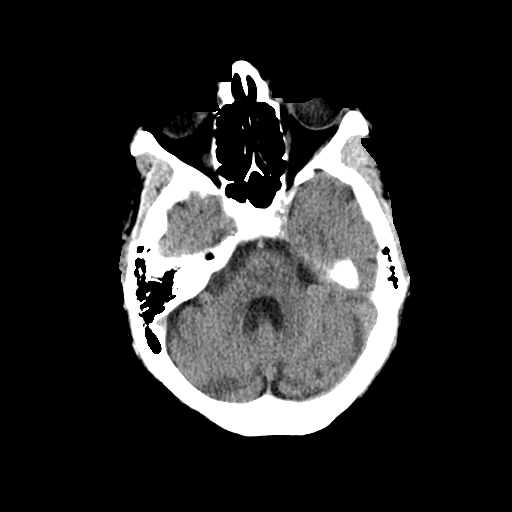
[im 15/33  brain]
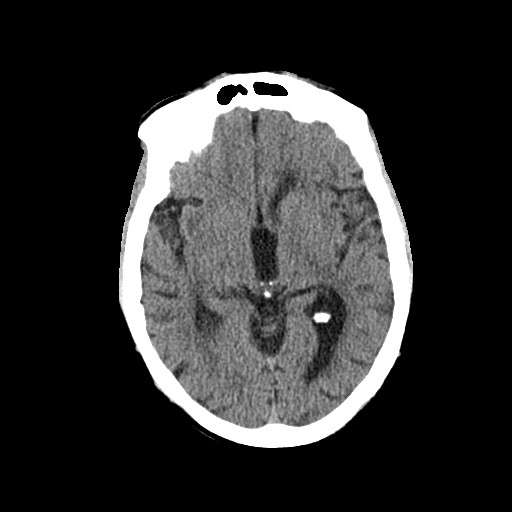
[im 18/33  brain]
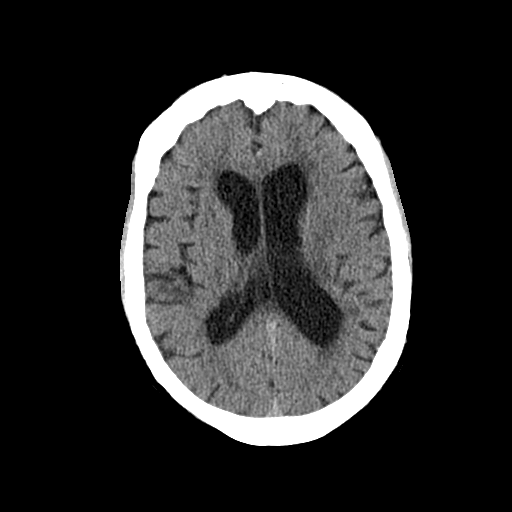
[im 18/33  bone]
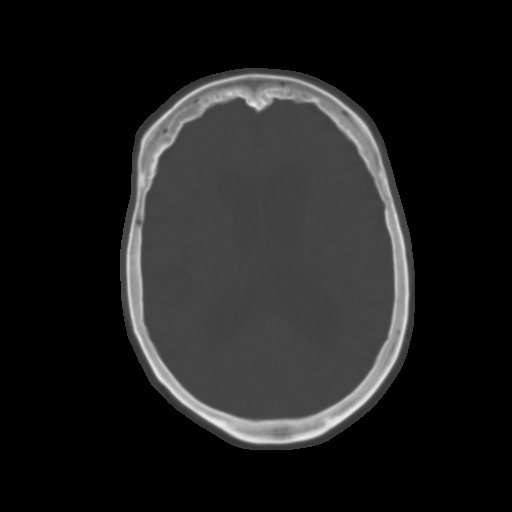
[im 23/33  brain]
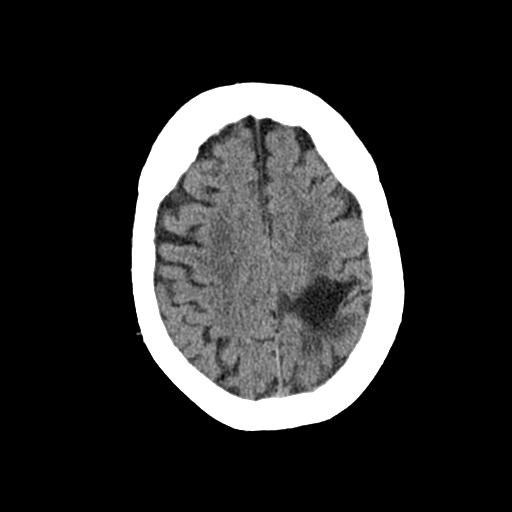
[im 26/33  brain]
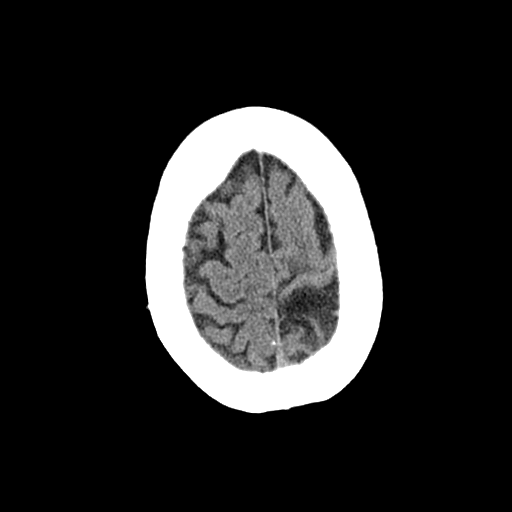
[im 30/33  brain]
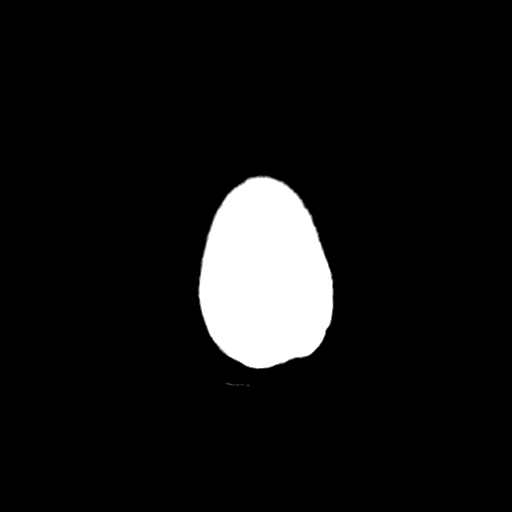

[Series 4: cor soft · coronal · 0.33mm/px · 3 of 84 slices shown]
[im 28/84  brain]
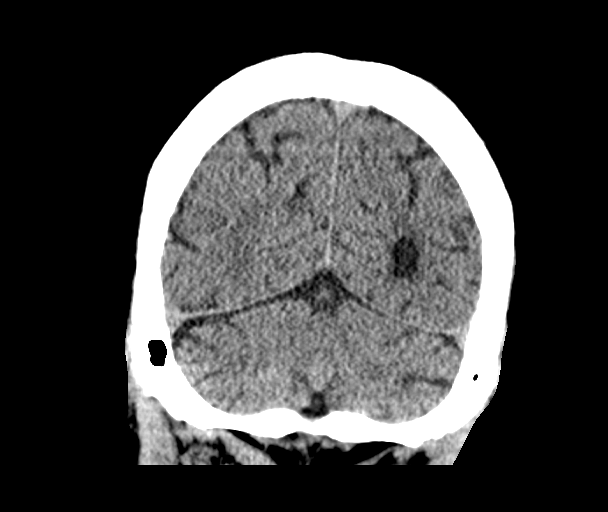
[im 37/84  brain]
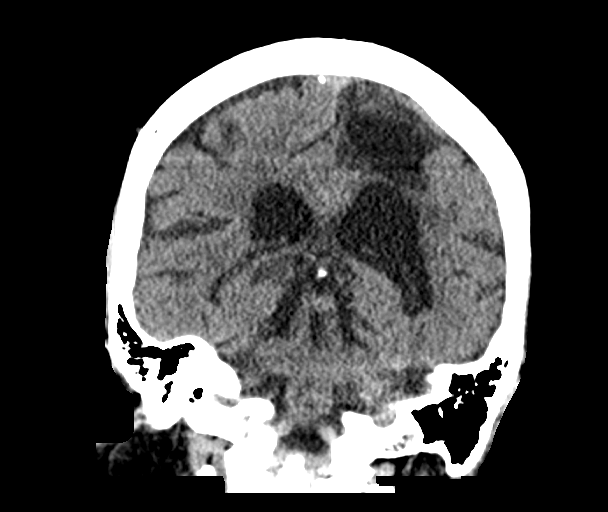
[im 47/84  brain]
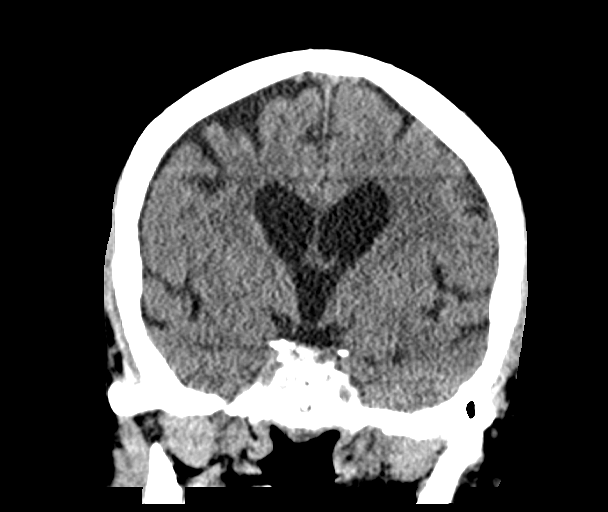

[Series 5: sag soft · sagittal · 0.33mm/px · 3 of 56 slices shown]
[im 19/56  brain]
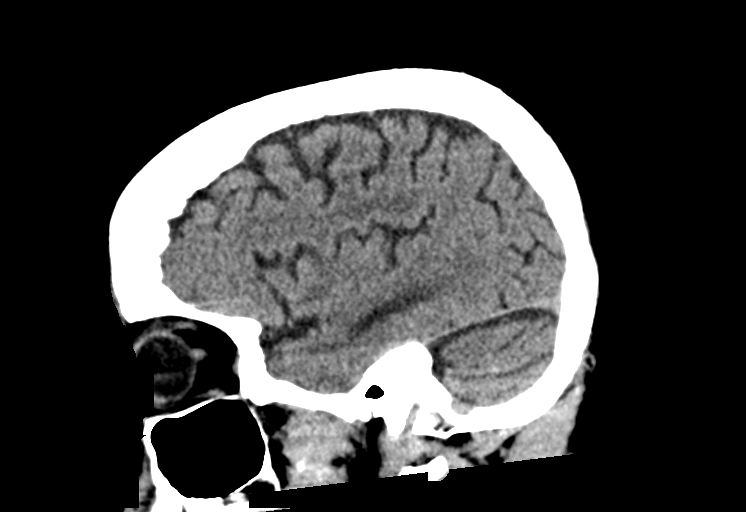
[im 28/56  brain]
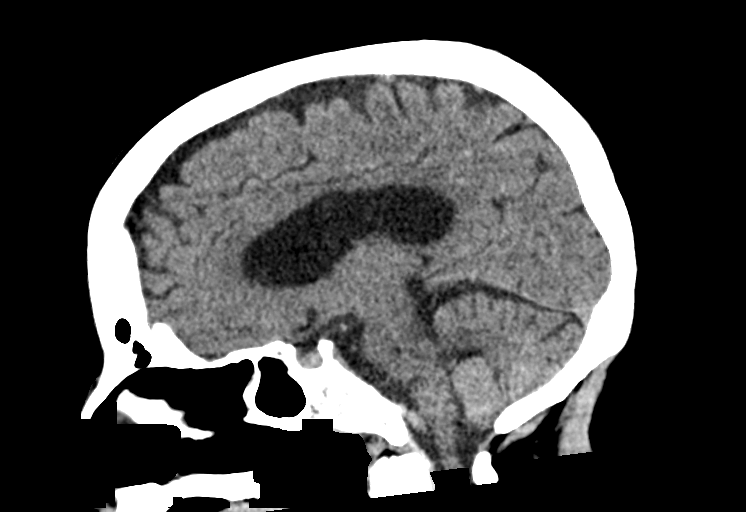
[im 37/56  brain]
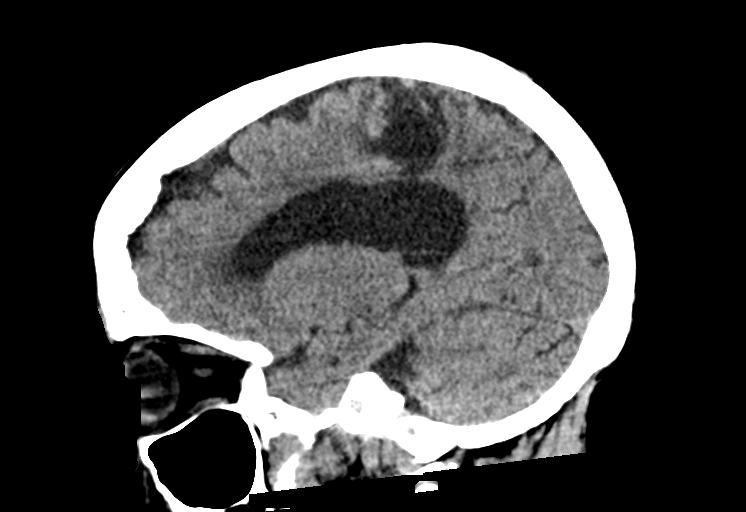

[14 of 47 positions shown; findings below may reference images not displayed]

FINDINGS: Brain: There is no evidence of acute infarct, intracranial
hemorrhage, mass, midline shift, or extra-axial fluid collection.
There is a chronic left frontoparietal cortical and subcortical
infarct near the vertex which is new from the prior study. Mild
lateral and third ventriculomegaly is new and felt to reflect
central predominant cerebral atrophy rather than hydrocephalus.
Periventricular white matter hypodensities are nonspecific but
compatible with mild chronic small vessel ischemic disease. A
chronic subcentimeter infarct in the left cerebellum is new.

Vascular: Calcified atherosclerosis at the skull base. No hyperdense
vessel.

Skull: No fracture focal osseous lesion.

Sinuses/Orbits: Tiny left maxillary sinus mucous retention cyst.
Clear mastoid air cells. Unremarkable orbits.

Other: None.

ASPECTS (Alberta Stroke Program Early CT Score)

- Ganglionic level infarction (caudate, lentiform nuclei, internal
capsule, insula, M1-M3 cortex): 7

- Supraganglionic infarction (M4-M6 cortex): 3

Total score (0-10 with 10 being normal): 10
IMPRESSION: 1. No evidence of acute intracranial abnormality.
2. ASPECTS is 10.
3. Interval chronic left frontoparietal and left cerebellar
infarcts.
4. Mild chronic small vessel ischemic disease and cerebral atrophy.

These results were called by telephone at the time of interpretation
on 10/05/2017 at [DATE] to Dr. MED DAOUD NEKIMOUE , who verbally
acknowledged these results.

## 2019-06-22 DIAGNOSIS — R293 Abnormal posture: Secondary | ICD-10-CM | POA: Diagnosis not present

## 2019-06-22 DIAGNOSIS — R4701 Aphasia: Secondary | ICD-10-CM | POA: Diagnosis not present

## 2019-06-22 DIAGNOSIS — J45909 Unspecified asthma, uncomplicated: Secondary | ICD-10-CM | POA: Diagnosis not present

## 2019-06-22 DIAGNOSIS — M6281 Muscle weakness (generalized): Secondary | ICD-10-CM | POA: Diagnosis not present

## 2019-06-22 DIAGNOSIS — R278 Other lack of coordination: Secondary | ICD-10-CM | POA: Diagnosis not present

## 2019-06-22 DIAGNOSIS — R2681 Unsteadiness on feet: Secondary | ICD-10-CM | POA: Diagnosis not present

## 2019-06-22 DIAGNOSIS — I69951 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: Secondary | ICD-10-CM | POA: Diagnosis not present

## 2019-06-22 DIAGNOSIS — R41841 Cognitive communication deficit: Secondary | ICD-10-CM | POA: Diagnosis not present

## 2019-06-22 DIAGNOSIS — M62421 Contracture of muscle, right upper arm: Secondary | ICD-10-CM | POA: Diagnosis not present

## 2019-06-23 DIAGNOSIS — R293 Abnormal posture: Secondary | ICD-10-CM | POA: Diagnosis not present

## 2019-06-23 DIAGNOSIS — R4701 Aphasia: Secondary | ICD-10-CM | POA: Diagnosis not present

## 2019-06-23 DIAGNOSIS — M62421 Contracture of muscle, right upper arm: Secondary | ICD-10-CM | POA: Diagnosis not present

## 2019-06-23 DIAGNOSIS — I69951 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: Secondary | ICD-10-CM | POA: Diagnosis not present

## 2019-06-23 DIAGNOSIS — M6281 Muscle weakness (generalized): Secondary | ICD-10-CM | POA: Diagnosis not present

## 2019-06-23 DIAGNOSIS — R41841 Cognitive communication deficit: Secondary | ICD-10-CM | POA: Diagnosis not present

## 2019-06-23 DIAGNOSIS — R278 Other lack of coordination: Secondary | ICD-10-CM | POA: Diagnosis not present

## 2019-06-23 DIAGNOSIS — R2681 Unsteadiness on feet: Secondary | ICD-10-CM | POA: Diagnosis not present

## 2019-06-23 DIAGNOSIS — J45909 Unspecified asthma, uncomplicated: Secondary | ICD-10-CM | POA: Diagnosis not present

## 2019-06-24 DIAGNOSIS — J45909 Unspecified asthma, uncomplicated: Secondary | ICD-10-CM | POA: Diagnosis not present

## 2019-06-24 DIAGNOSIS — M62421 Contracture of muscle, right upper arm: Secondary | ICD-10-CM | POA: Diagnosis not present

## 2019-06-24 DIAGNOSIS — M6281 Muscle weakness (generalized): Secondary | ICD-10-CM | POA: Diagnosis not present

## 2019-06-24 DIAGNOSIS — R41841 Cognitive communication deficit: Secondary | ICD-10-CM | POA: Diagnosis not present

## 2019-06-24 DIAGNOSIS — I69951 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: Secondary | ICD-10-CM | POA: Diagnosis not present

## 2019-06-24 DIAGNOSIS — R2681 Unsteadiness on feet: Secondary | ICD-10-CM | POA: Diagnosis not present

## 2019-06-24 DIAGNOSIS — R293 Abnormal posture: Secondary | ICD-10-CM | POA: Diagnosis not present

## 2019-06-24 DIAGNOSIS — R4701 Aphasia: Secondary | ICD-10-CM | POA: Diagnosis not present

## 2019-06-24 DIAGNOSIS — R278 Other lack of coordination: Secondary | ICD-10-CM | POA: Diagnosis not present

## 2019-06-25 DIAGNOSIS — R4701 Aphasia: Secondary | ICD-10-CM | POA: Diagnosis not present

## 2019-06-25 DIAGNOSIS — J45909 Unspecified asthma, uncomplicated: Secondary | ICD-10-CM | POA: Diagnosis not present

## 2019-06-25 DIAGNOSIS — R278 Other lack of coordination: Secondary | ICD-10-CM | POA: Diagnosis not present

## 2019-06-25 DIAGNOSIS — I69951 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: Secondary | ICD-10-CM | POA: Diagnosis not present

## 2019-06-25 DIAGNOSIS — M6281 Muscle weakness (generalized): Secondary | ICD-10-CM | POA: Diagnosis not present

## 2019-06-25 DIAGNOSIS — R41841 Cognitive communication deficit: Secondary | ICD-10-CM | POA: Diagnosis not present

## 2019-06-25 DIAGNOSIS — M62421 Contracture of muscle, right upper arm: Secondary | ICD-10-CM | POA: Diagnosis not present

## 2019-06-25 DIAGNOSIS — R2681 Unsteadiness on feet: Secondary | ICD-10-CM | POA: Diagnosis not present

## 2019-06-25 DIAGNOSIS — R293 Abnormal posture: Secondary | ICD-10-CM | POA: Diagnosis not present

## 2019-06-26 DIAGNOSIS — R278 Other lack of coordination: Secondary | ICD-10-CM | POA: Diagnosis not present

## 2019-06-26 DIAGNOSIS — R293 Abnormal posture: Secondary | ICD-10-CM | POA: Diagnosis not present

## 2019-06-26 DIAGNOSIS — M6281 Muscle weakness (generalized): Secondary | ICD-10-CM | POA: Diagnosis not present

## 2019-06-26 DIAGNOSIS — J45909 Unspecified asthma, uncomplicated: Secondary | ICD-10-CM | POA: Diagnosis not present

## 2019-06-26 DIAGNOSIS — R41841 Cognitive communication deficit: Secondary | ICD-10-CM | POA: Diagnosis not present

## 2019-06-26 DIAGNOSIS — M62421 Contracture of muscle, right upper arm: Secondary | ICD-10-CM | POA: Diagnosis not present

## 2019-06-26 DIAGNOSIS — R2681 Unsteadiness on feet: Secondary | ICD-10-CM | POA: Diagnosis not present

## 2019-06-26 DIAGNOSIS — R4701 Aphasia: Secondary | ICD-10-CM | POA: Diagnosis not present

## 2019-06-26 DIAGNOSIS — I69951 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: Secondary | ICD-10-CM | POA: Diagnosis not present

## 2019-06-28 DIAGNOSIS — M62421 Contracture of muscle, right upper arm: Secondary | ICD-10-CM | POA: Diagnosis not present

## 2019-06-28 DIAGNOSIS — R41841 Cognitive communication deficit: Secondary | ICD-10-CM | POA: Diagnosis not present

## 2019-06-28 DIAGNOSIS — M6281 Muscle weakness (generalized): Secondary | ICD-10-CM | POA: Diagnosis not present

## 2019-06-28 DIAGNOSIS — I69951 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: Secondary | ICD-10-CM | POA: Diagnosis not present

## 2019-06-28 DIAGNOSIS — R278 Other lack of coordination: Secondary | ICD-10-CM | POA: Diagnosis not present

## 2019-06-28 DIAGNOSIS — J45909 Unspecified asthma, uncomplicated: Secondary | ICD-10-CM | POA: Diagnosis not present

## 2019-06-28 DIAGNOSIS — R4701 Aphasia: Secondary | ICD-10-CM | POA: Diagnosis not present

## 2019-06-28 DIAGNOSIS — R2681 Unsteadiness on feet: Secondary | ICD-10-CM | POA: Diagnosis not present

## 2019-06-28 DIAGNOSIS — R293 Abnormal posture: Secondary | ICD-10-CM | POA: Diagnosis not present

## 2019-06-29 DIAGNOSIS — R278 Other lack of coordination: Secondary | ICD-10-CM | POA: Diagnosis not present

## 2019-06-29 DIAGNOSIS — M6281 Muscle weakness (generalized): Secondary | ICD-10-CM | POA: Diagnosis not present

## 2019-06-29 DIAGNOSIS — R41841 Cognitive communication deficit: Secondary | ICD-10-CM | POA: Diagnosis not present

## 2019-06-29 DIAGNOSIS — R2681 Unsteadiness on feet: Secondary | ICD-10-CM | POA: Diagnosis not present

## 2019-06-29 DIAGNOSIS — M62421 Contracture of muscle, right upper arm: Secondary | ICD-10-CM | POA: Diagnosis not present

## 2019-06-29 DIAGNOSIS — R293 Abnormal posture: Secondary | ICD-10-CM | POA: Diagnosis not present

## 2019-06-29 DIAGNOSIS — R4701 Aphasia: Secondary | ICD-10-CM | POA: Diagnosis not present

## 2019-06-29 DIAGNOSIS — I69951 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: Secondary | ICD-10-CM | POA: Diagnosis not present

## 2019-06-29 DIAGNOSIS — Z9181 History of falling: Secondary | ICD-10-CM | POA: Diagnosis not present

## 2019-07-06 DIAGNOSIS — Z9181 History of falling: Secondary | ICD-10-CM | POA: Diagnosis not present

## 2019-07-06 DIAGNOSIS — R4701 Aphasia: Secondary | ICD-10-CM | POA: Diagnosis not present

## 2019-07-06 DIAGNOSIS — R2681 Unsteadiness on feet: Secondary | ICD-10-CM | POA: Diagnosis not present

## 2019-07-06 DIAGNOSIS — R41841 Cognitive communication deficit: Secondary | ICD-10-CM | POA: Diagnosis not present

## 2019-07-06 DIAGNOSIS — I69951 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: Secondary | ICD-10-CM | POA: Diagnosis not present

## 2019-07-06 DIAGNOSIS — M6281 Muscle weakness (generalized): Secondary | ICD-10-CM | POA: Diagnosis not present

## 2019-07-06 DIAGNOSIS — M62421 Contracture of muscle, right upper arm: Secondary | ICD-10-CM | POA: Diagnosis not present

## 2019-07-06 DIAGNOSIS — R293 Abnormal posture: Secondary | ICD-10-CM | POA: Diagnosis not present

## 2019-07-06 DIAGNOSIS — R278 Other lack of coordination: Secondary | ICD-10-CM | POA: Diagnosis not present

## 2019-07-07 DIAGNOSIS — M6281 Muscle weakness (generalized): Secondary | ICD-10-CM | POA: Diagnosis not present

## 2019-07-07 DIAGNOSIS — M62421 Contracture of muscle, right upper arm: Secondary | ICD-10-CM | POA: Diagnosis not present

## 2019-07-07 DIAGNOSIS — R41841 Cognitive communication deficit: Secondary | ICD-10-CM | POA: Diagnosis not present

## 2019-07-07 DIAGNOSIS — R4701 Aphasia: Secondary | ICD-10-CM | POA: Diagnosis not present

## 2019-07-07 DIAGNOSIS — R2681 Unsteadiness on feet: Secondary | ICD-10-CM | POA: Diagnosis not present

## 2019-07-07 DIAGNOSIS — R278 Other lack of coordination: Secondary | ICD-10-CM | POA: Diagnosis not present

## 2019-07-07 DIAGNOSIS — Z9181 History of falling: Secondary | ICD-10-CM | POA: Diagnosis not present

## 2019-07-07 DIAGNOSIS — I69951 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: Secondary | ICD-10-CM | POA: Diagnosis not present

## 2019-07-07 DIAGNOSIS — R293 Abnormal posture: Secondary | ICD-10-CM | POA: Diagnosis not present

## 2019-07-08 DIAGNOSIS — R4701 Aphasia: Secondary | ICD-10-CM | POA: Diagnosis not present

## 2019-07-08 DIAGNOSIS — M62421 Contracture of muscle, right upper arm: Secondary | ICD-10-CM | POA: Diagnosis not present

## 2019-07-08 DIAGNOSIS — I69951 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: Secondary | ICD-10-CM | POA: Diagnosis not present

## 2019-07-08 DIAGNOSIS — M6281 Muscle weakness (generalized): Secondary | ICD-10-CM | POA: Diagnosis not present

## 2019-07-08 DIAGNOSIS — R41841 Cognitive communication deficit: Secondary | ICD-10-CM | POA: Diagnosis not present

## 2019-07-08 DIAGNOSIS — Z9181 History of falling: Secondary | ICD-10-CM | POA: Diagnosis not present

## 2019-07-08 DIAGNOSIS — R293 Abnormal posture: Secondary | ICD-10-CM | POA: Diagnosis not present

## 2019-07-08 DIAGNOSIS — R278 Other lack of coordination: Secondary | ICD-10-CM | POA: Diagnosis not present

## 2019-07-08 DIAGNOSIS — R2681 Unsteadiness on feet: Secondary | ICD-10-CM | POA: Diagnosis not present

## 2019-07-09 DIAGNOSIS — M62421 Contracture of muscle, right upper arm: Secondary | ICD-10-CM | POA: Diagnosis not present

## 2019-07-09 DIAGNOSIS — R293 Abnormal posture: Secondary | ICD-10-CM | POA: Diagnosis not present

## 2019-07-09 DIAGNOSIS — Z9181 History of falling: Secondary | ICD-10-CM | POA: Diagnosis not present

## 2019-07-09 DIAGNOSIS — M6281 Muscle weakness (generalized): Secondary | ICD-10-CM | POA: Diagnosis not present

## 2019-07-09 DIAGNOSIS — R2681 Unsteadiness on feet: Secondary | ICD-10-CM | POA: Diagnosis not present

## 2019-07-09 DIAGNOSIS — I69951 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: Secondary | ICD-10-CM | POA: Diagnosis not present

## 2019-07-09 DIAGNOSIS — R4701 Aphasia: Secondary | ICD-10-CM | POA: Diagnosis not present

## 2019-07-09 DIAGNOSIS — R41841 Cognitive communication deficit: Secondary | ICD-10-CM | POA: Diagnosis not present

## 2019-07-09 DIAGNOSIS — R278 Other lack of coordination: Secondary | ICD-10-CM | POA: Diagnosis not present

## 2019-07-12 DIAGNOSIS — R4701 Aphasia: Secondary | ICD-10-CM | POA: Diagnosis not present

## 2019-07-12 DIAGNOSIS — Z9181 History of falling: Secondary | ICD-10-CM | POA: Diagnosis not present

## 2019-07-12 DIAGNOSIS — R2681 Unsteadiness on feet: Secondary | ICD-10-CM | POA: Diagnosis not present

## 2019-07-12 DIAGNOSIS — R278 Other lack of coordination: Secondary | ICD-10-CM | POA: Diagnosis not present

## 2019-07-12 DIAGNOSIS — I69951 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: Secondary | ICD-10-CM | POA: Diagnosis not present

## 2019-07-12 DIAGNOSIS — R293 Abnormal posture: Secondary | ICD-10-CM | POA: Diagnosis not present

## 2019-07-12 DIAGNOSIS — M62421 Contracture of muscle, right upper arm: Secondary | ICD-10-CM | POA: Diagnosis not present

## 2019-07-12 DIAGNOSIS — M6281 Muscle weakness (generalized): Secondary | ICD-10-CM | POA: Diagnosis not present

## 2019-07-12 DIAGNOSIS — R41841 Cognitive communication deficit: Secondary | ICD-10-CM | POA: Diagnosis not present

## 2019-07-16 DIAGNOSIS — M62421 Contracture of muscle, right upper arm: Secondary | ICD-10-CM | POA: Diagnosis not present

## 2019-07-16 DIAGNOSIS — M6281 Muscle weakness (generalized): Secondary | ICD-10-CM | POA: Diagnosis not present

## 2019-07-16 DIAGNOSIS — R293 Abnormal posture: Secondary | ICD-10-CM | POA: Diagnosis not present

## 2019-07-16 DIAGNOSIS — R4701 Aphasia: Secondary | ICD-10-CM | POA: Diagnosis not present

## 2019-07-16 DIAGNOSIS — I69951 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: Secondary | ICD-10-CM | POA: Diagnosis not present

## 2019-07-16 DIAGNOSIS — R41841 Cognitive communication deficit: Secondary | ICD-10-CM | POA: Diagnosis not present

## 2019-07-16 DIAGNOSIS — R278 Other lack of coordination: Secondary | ICD-10-CM | POA: Diagnosis not present

## 2019-07-16 DIAGNOSIS — Z9181 History of falling: Secondary | ICD-10-CM | POA: Diagnosis not present

## 2019-07-16 DIAGNOSIS — R2681 Unsteadiness on feet: Secondary | ICD-10-CM | POA: Diagnosis not present

## 2019-07-20 DIAGNOSIS — R41841 Cognitive communication deficit: Secondary | ICD-10-CM | POA: Diagnosis not present

## 2019-07-20 DIAGNOSIS — R278 Other lack of coordination: Secondary | ICD-10-CM | POA: Diagnosis not present

## 2019-07-20 DIAGNOSIS — I69951 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: Secondary | ICD-10-CM | POA: Diagnosis not present

## 2019-07-20 DIAGNOSIS — M62421 Contracture of muscle, right upper arm: Secondary | ICD-10-CM | POA: Diagnosis not present

## 2019-07-20 DIAGNOSIS — R4701 Aphasia: Secondary | ICD-10-CM | POA: Diagnosis not present

## 2019-07-20 DIAGNOSIS — R293 Abnormal posture: Secondary | ICD-10-CM | POA: Diagnosis not present

## 2019-07-20 DIAGNOSIS — M6281 Muscle weakness (generalized): Secondary | ICD-10-CM | POA: Diagnosis not present

## 2019-07-20 DIAGNOSIS — R2681 Unsteadiness on feet: Secondary | ICD-10-CM | POA: Diagnosis not present

## 2019-07-20 DIAGNOSIS — Z9181 History of falling: Secondary | ICD-10-CM | POA: Diagnosis not present

## 2019-07-21 DIAGNOSIS — I69951 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: Secondary | ICD-10-CM | POA: Diagnosis not present

## 2019-07-21 DIAGNOSIS — R2681 Unsteadiness on feet: Secondary | ICD-10-CM | POA: Diagnosis not present

## 2019-07-21 DIAGNOSIS — M62421 Contracture of muscle, right upper arm: Secondary | ICD-10-CM | POA: Diagnosis not present

## 2019-07-21 DIAGNOSIS — R278 Other lack of coordination: Secondary | ICD-10-CM | POA: Diagnosis not present

## 2019-07-21 DIAGNOSIS — R41841 Cognitive communication deficit: Secondary | ICD-10-CM | POA: Diagnosis not present

## 2019-07-21 DIAGNOSIS — R4701 Aphasia: Secondary | ICD-10-CM | POA: Diagnosis not present

## 2019-07-21 DIAGNOSIS — M6281 Muscle weakness (generalized): Secondary | ICD-10-CM | POA: Diagnosis not present

## 2019-07-21 DIAGNOSIS — R293 Abnormal posture: Secondary | ICD-10-CM | POA: Diagnosis not present

## 2019-07-21 DIAGNOSIS — Z9181 History of falling: Secondary | ICD-10-CM | POA: Diagnosis not present

## 2019-07-28 DIAGNOSIS — R4701 Aphasia: Secondary | ICD-10-CM | POA: Diagnosis not present

## 2019-07-28 DIAGNOSIS — M6281 Muscle weakness (generalized): Secondary | ICD-10-CM | POA: Diagnosis not present

## 2019-07-28 DIAGNOSIS — R278 Other lack of coordination: Secondary | ICD-10-CM | POA: Diagnosis not present

## 2019-07-28 DIAGNOSIS — I69951 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: Secondary | ICD-10-CM | POA: Diagnosis not present

## 2019-07-28 DIAGNOSIS — M62421 Contracture of muscle, right upper arm: Secondary | ICD-10-CM | POA: Diagnosis not present

## 2019-07-28 DIAGNOSIS — Z9181 History of falling: Secondary | ICD-10-CM | POA: Diagnosis not present

## 2019-07-28 DIAGNOSIS — R2681 Unsteadiness on feet: Secondary | ICD-10-CM | POA: Diagnosis not present

## 2019-07-28 DIAGNOSIS — R41841 Cognitive communication deficit: Secondary | ICD-10-CM | POA: Diagnosis not present

## 2019-07-28 DIAGNOSIS — R293 Abnormal posture: Secondary | ICD-10-CM | POA: Diagnosis not present

## 2019-07-29 DIAGNOSIS — R41841 Cognitive communication deficit: Secondary | ICD-10-CM | POA: Diagnosis not present

## 2019-07-29 DIAGNOSIS — I69951 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: Secondary | ICD-10-CM | POA: Diagnosis not present

## 2019-07-29 DIAGNOSIS — R293 Abnormal posture: Secondary | ICD-10-CM | POA: Diagnosis not present

## 2019-07-29 DIAGNOSIS — R278 Other lack of coordination: Secondary | ICD-10-CM | POA: Diagnosis not present

## 2019-07-29 DIAGNOSIS — M62421 Contracture of muscle, right upper arm: Secondary | ICD-10-CM | POA: Diagnosis not present

## 2019-07-29 DIAGNOSIS — R2681 Unsteadiness on feet: Secondary | ICD-10-CM | POA: Diagnosis not present

## 2019-07-29 DIAGNOSIS — R4701 Aphasia: Secondary | ICD-10-CM | POA: Diagnosis not present

## 2019-07-29 DIAGNOSIS — Z9181 History of falling: Secondary | ICD-10-CM | POA: Diagnosis not present

## 2019-07-29 DIAGNOSIS — M6281 Muscle weakness (generalized): Secondary | ICD-10-CM | POA: Diagnosis not present

## 2019-08-04 DIAGNOSIS — R4701 Aphasia: Secondary | ICD-10-CM | POA: Diagnosis not present

## 2019-08-04 DIAGNOSIS — Z9181 History of falling: Secondary | ICD-10-CM | POA: Diagnosis not present

## 2019-08-04 DIAGNOSIS — I69951 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: Secondary | ICD-10-CM | POA: Diagnosis not present

## 2019-08-04 DIAGNOSIS — R278 Other lack of coordination: Secondary | ICD-10-CM | POA: Diagnosis not present

## 2019-08-04 DIAGNOSIS — M6281 Muscle weakness (generalized): Secondary | ICD-10-CM | POA: Diagnosis not present

## 2019-08-04 DIAGNOSIS — R293 Abnormal posture: Secondary | ICD-10-CM | POA: Diagnosis not present

## 2019-08-04 DIAGNOSIS — R2681 Unsteadiness on feet: Secondary | ICD-10-CM | POA: Diagnosis not present

## 2019-08-04 DIAGNOSIS — M62421 Contracture of muscle, right upper arm: Secondary | ICD-10-CM | POA: Diagnosis not present

## 2019-08-04 DIAGNOSIS — R41841 Cognitive communication deficit: Secondary | ICD-10-CM | POA: Diagnosis not present

## 2019-08-05 DIAGNOSIS — R278 Other lack of coordination: Secondary | ICD-10-CM | POA: Diagnosis not present

## 2019-08-05 DIAGNOSIS — R4701 Aphasia: Secondary | ICD-10-CM | POA: Diagnosis not present

## 2019-08-05 DIAGNOSIS — M62421 Contracture of muscle, right upper arm: Secondary | ICD-10-CM | POA: Diagnosis not present

## 2019-08-05 DIAGNOSIS — Z9181 History of falling: Secondary | ICD-10-CM | POA: Diagnosis not present

## 2019-08-05 DIAGNOSIS — R293 Abnormal posture: Secondary | ICD-10-CM | POA: Diagnosis not present

## 2019-08-05 DIAGNOSIS — R2681 Unsteadiness on feet: Secondary | ICD-10-CM | POA: Diagnosis not present

## 2019-08-05 DIAGNOSIS — R41841 Cognitive communication deficit: Secondary | ICD-10-CM | POA: Diagnosis not present

## 2019-08-05 DIAGNOSIS — M6281 Muscle weakness (generalized): Secondary | ICD-10-CM | POA: Diagnosis not present

## 2019-08-05 DIAGNOSIS — I69951 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: Secondary | ICD-10-CM | POA: Diagnosis not present

## 2019-08-11 DIAGNOSIS — R278 Other lack of coordination: Secondary | ICD-10-CM | POA: Diagnosis not present

## 2019-08-11 DIAGNOSIS — R41841 Cognitive communication deficit: Secondary | ICD-10-CM | POA: Diagnosis not present

## 2019-08-11 DIAGNOSIS — R2681 Unsteadiness on feet: Secondary | ICD-10-CM | POA: Diagnosis not present

## 2019-08-11 DIAGNOSIS — R4701 Aphasia: Secondary | ICD-10-CM | POA: Diagnosis not present

## 2019-08-11 DIAGNOSIS — R293 Abnormal posture: Secondary | ICD-10-CM | POA: Diagnosis not present

## 2019-08-11 DIAGNOSIS — I69951 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: Secondary | ICD-10-CM | POA: Diagnosis not present

## 2019-08-11 DIAGNOSIS — M6281 Muscle weakness (generalized): Secondary | ICD-10-CM | POA: Diagnosis not present

## 2019-08-11 DIAGNOSIS — M62421 Contracture of muscle, right upper arm: Secondary | ICD-10-CM | POA: Diagnosis not present

## 2019-08-11 DIAGNOSIS — Z9181 History of falling: Secondary | ICD-10-CM | POA: Diagnosis not present

## 2019-08-12 DIAGNOSIS — M6281 Muscle weakness (generalized): Secondary | ICD-10-CM | POA: Diagnosis not present

## 2019-08-12 DIAGNOSIS — R293 Abnormal posture: Secondary | ICD-10-CM | POA: Diagnosis not present

## 2019-08-12 DIAGNOSIS — M62421 Contracture of muscle, right upper arm: Secondary | ICD-10-CM | POA: Diagnosis not present

## 2019-08-12 DIAGNOSIS — R278 Other lack of coordination: Secondary | ICD-10-CM | POA: Diagnosis not present

## 2019-08-12 DIAGNOSIS — R41841 Cognitive communication deficit: Secondary | ICD-10-CM | POA: Diagnosis not present

## 2019-08-12 DIAGNOSIS — I69951 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: Secondary | ICD-10-CM | POA: Diagnosis not present

## 2019-08-12 DIAGNOSIS — R4701 Aphasia: Secondary | ICD-10-CM | POA: Diagnosis not present

## 2019-08-12 DIAGNOSIS — Z9181 History of falling: Secondary | ICD-10-CM | POA: Diagnosis not present

## 2019-08-12 DIAGNOSIS — R2681 Unsteadiness on feet: Secondary | ICD-10-CM | POA: Diagnosis not present

## 2019-08-18 DIAGNOSIS — R41841 Cognitive communication deficit: Secondary | ICD-10-CM | POA: Diagnosis not present

## 2019-08-18 DIAGNOSIS — I69951 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: Secondary | ICD-10-CM | POA: Diagnosis not present

## 2019-08-18 DIAGNOSIS — R293 Abnormal posture: Secondary | ICD-10-CM | POA: Diagnosis not present

## 2019-08-18 DIAGNOSIS — R2681 Unsteadiness on feet: Secondary | ICD-10-CM | POA: Diagnosis not present

## 2019-08-18 DIAGNOSIS — R4701 Aphasia: Secondary | ICD-10-CM | POA: Diagnosis not present

## 2019-08-18 DIAGNOSIS — M62421 Contracture of muscle, right upper arm: Secondary | ICD-10-CM | POA: Diagnosis not present

## 2019-08-18 DIAGNOSIS — M6281 Muscle weakness (generalized): Secondary | ICD-10-CM | POA: Diagnosis not present

## 2019-08-18 DIAGNOSIS — R278 Other lack of coordination: Secondary | ICD-10-CM | POA: Diagnosis not present

## 2019-08-18 DIAGNOSIS — Z9181 History of falling: Secondary | ICD-10-CM | POA: Diagnosis not present

## 2019-08-19 DIAGNOSIS — M62421 Contracture of muscle, right upper arm: Secondary | ICD-10-CM | POA: Diagnosis not present

## 2019-08-19 DIAGNOSIS — R41841 Cognitive communication deficit: Secondary | ICD-10-CM | POA: Diagnosis not present

## 2019-08-19 DIAGNOSIS — Z9181 History of falling: Secondary | ICD-10-CM | POA: Diagnosis not present

## 2019-08-19 DIAGNOSIS — R2681 Unsteadiness on feet: Secondary | ICD-10-CM | POA: Diagnosis not present

## 2019-08-19 DIAGNOSIS — I69951 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: Secondary | ICD-10-CM | POA: Diagnosis not present

## 2019-08-19 DIAGNOSIS — R293 Abnormal posture: Secondary | ICD-10-CM | POA: Diagnosis not present

## 2019-08-19 DIAGNOSIS — R4701 Aphasia: Secondary | ICD-10-CM | POA: Diagnosis not present

## 2019-08-19 DIAGNOSIS — M6281 Muscle weakness (generalized): Secondary | ICD-10-CM | POA: Diagnosis not present

## 2019-08-19 DIAGNOSIS — R278 Other lack of coordination: Secondary | ICD-10-CM | POA: Diagnosis not present

## 2019-08-24 DIAGNOSIS — R4701 Aphasia: Secondary | ICD-10-CM | POA: Diagnosis not present

## 2019-08-24 DIAGNOSIS — I69951 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: Secondary | ICD-10-CM | POA: Diagnosis not present

## 2019-08-24 DIAGNOSIS — R2681 Unsteadiness on feet: Secondary | ICD-10-CM | POA: Diagnosis not present

## 2019-08-24 DIAGNOSIS — M62421 Contracture of muscle, right upper arm: Secondary | ICD-10-CM | POA: Diagnosis not present

## 2019-08-24 DIAGNOSIS — M6281 Muscle weakness (generalized): Secondary | ICD-10-CM | POA: Diagnosis not present

## 2019-08-24 DIAGNOSIS — R278 Other lack of coordination: Secondary | ICD-10-CM | POA: Diagnosis not present

## 2019-08-24 DIAGNOSIS — R293 Abnormal posture: Secondary | ICD-10-CM | POA: Diagnosis not present

## 2019-08-24 DIAGNOSIS — Z9181 History of falling: Secondary | ICD-10-CM | POA: Diagnosis not present

## 2019-08-24 DIAGNOSIS — R41841 Cognitive communication deficit: Secondary | ICD-10-CM | POA: Diagnosis not present

## 2019-08-25 DIAGNOSIS — R293 Abnormal posture: Secondary | ICD-10-CM | POA: Diagnosis not present

## 2019-08-25 DIAGNOSIS — R4701 Aphasia: Secondary | ICD-10-CM | POA: Diagnosis not present

## 2019-08-25 DIAGNOSIS — R2681 Unsteadiness on feet: Secondary | ICD-10-CM | POA: Diagnosis not present

## 2019-08-25 DIAGNOSIS — R278 Other lack of coordination: Secondary | ICD-10-CM | POA: Diagnosis not present

## 2019-08-25 DIAGNOSIS — M6281 Muscle weakness (generalized): Secondary | ICD-10-CM | POA: Diagnosis not present

## 2019-08-25 DIAGNOSIS — R41841 Cognitive communication deficit: Secondary | ICD-10-CM | POA: Diagnosis not present

## 2019-08-25 DIAGNOSIS — Z9181 History of falling: Secondary | ICD-10-CM | POA: Diagnosis not present

## 2019-08-25 DIAGNOSIS — I69951 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: Secondary | ICD-10-CM | POA: Diagnosis not present

## 2019-08-25 DIAGNOSIS — M62421 Contracture of muscle, right upper arm: Secondary | ICD-10-CM | POA: Diagnosis not present

## 2019-08-26 ENCOUNTER — Other Ambulatory Visit: Payer: Self-pay | Admitting: Family Medicine

## 2019-08-26 DIAGNOSIS — R4701 Aphasia: Secondary | ICD-10-CM | POA: Diagnosis not present

## 2019-08-26 DIAGNOSIS — R293 Abnormal posture: Secondary | ICD-10-CM | POA: Diagnosis not present

## 2019-08-26 DIAGNOSIS — R278 Other lack of coordination: Secondary | ICD-10-CM | POA: Diagnosis not present

## 2019-08-26 DIAGNOSIS — Z1231 Encounter for screening mammogram for malignant neoplasm of breast: Secondary | ICD-10-CM

## 2019-08-26 DIAGNOSIS — I69951 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: Secondary | ICD-10-CM | POA: Diagnosis not present

## 2019-08-26 DIAGNOSIS — Z9181 History of falling: Secondary | ICD-10-CM | POA: Diagnosis not present

## 2019-08-26 DIAGNOSIS — M6281 Muscle weakness (generalized): Secondary | ICD-10-CM | POA: Diagnosis not present

## 2019-08-26 DIAGNOSIS — M62421 Contracture of muscle, right upper arm: Secondary | ICD-10-CM | POA: Diagnosis not present

## 2019-08-26 DIAGNOSIS — R2681 Unsteadiness on feet: Secondary | ICD-10-CM | POA: Diagnosis not present

## 2019-08-26 DIAGNOSIS — R41841 Cognitive communication deficit: Secondary | ICD-10-CM | POA: Diagnosis not present

## 2019-08-31 DIAGNOSIS — R293 Abnormal posture: Secondary | ICD-10-CM | POA: Diagnosis not present

## 2019-08-31 DIAGNOSIS — R2681 Unsteadiness on feet: Secondary | ICD-10-CM | POA: Diagnosis not present

## 2019-08-31 DIAGNOSIS — Z9181 History of falling: Secondary | ICD-10-CM | POA: Diagnosis not present

## 2019-08-31 DIAGNOSIS — R4701 Aphasia: Secondary | ICD-10-CM | POA: Diagnosis not present

## 2019-08-31 DIAGNOSIS — M62421 Contracture of muscle, right upper arm: Secondary | ICD-10-CM | POA: Diagnosis not present

## 2019-08-31 DIAGNOSIS — R41841 Cognitive communication deficit: Secondary | ICD-10-CM | POA: Diagnosis not present

## 2019-08-31 DIAGNOSIS — I69951 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: Secondary | ICD-10-CM | POA: Diagnosis not present

## 2019-08-31 DIAGNOSIS — R278 Other lack of coordination: Secondary | ICD-10-CM | POA: Diagnosis not present

## 2019-08-31 DIAGNOSIS — M6281 Muscle weakness (generalized): Secondary | ICD-10-CM | POA: Diagnosis not present

## 2019-09-01 DIAGNOSIS — M6281 Muscle weakness (generalized): Secondary | ICD-10-CM | POA: Diagnosis not present

## 2019-09-01 DIAGNOSIS — R293 Abnormal posture: Secondary | ICD-10-CM | POA: Diagnosis not present

## 2019-09-01 DIAGNOSIS — Z9181 History of falling: Secondary | ICD-10-CM | POA: Diagnosis not present

## 2019-09-01 DIAGNOSIS — R278 Other lack of coordination: Secondary | ICD-10-CM | POA: Diagnosis not present

## 2019-09-01 DIAGNOSIS — I69951 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: Secondary | ICD-10-CM | POA: Diagnosis not present

## 2019-09-01 DIAGNOSIS — R4701 Aphasia: Secondary | ICD-10-CM | POA: Diagnosis not present

## 2019-09-01 DIAGNOSIS — R2681 Unsteadiness on feet: Secondary | ICD-10-CM | POA: Diagnosis not present

## 2019-09-01 DIAGNOSIS — M62421 Contracture of muscle, right upper arm: Secondary | ICD-10-CM | POA: Diagnosis not present

## 2019-09-01 DIAGNOSIS — R41841 Cognitive communication deficit: Secondary | ICD-10-CM | POA: Diagnosis not present

## 2019-09-02 DIAGNOSIS — R293 Abnormal posture: Secondary | ICD-10-CM | POA: Diagnosis not present

## 2019-09-02 DIAGNOSIS — M62421 Contracture of muscle, right upper arm: Secondary | ICD-10-CM | POA: Diagnosis not present

## 2019-09-02 DIAGNOSIS — R41841 Cognitive communication deficit: Secondary | ICD-10-CM | POA: Diagnosis not present

## 2019-09-02 DIAGNOSIS — Z9181 History of falling: Secondary | ICD-10-CM | POA: Diagnosis not present

## 2019-09-02 DIAGNOSIS — M6281 Muscle weakness (generalized): Secondary | ICD-10-CM | POA: Diagnosis not present

## 2019-09-02 DIAGNOSIS — R2681 Unsteadiness on feet: Secondary | ICD-10-CM | POA: Diagnosis not present

## 2019-09-02 DIAGNOSIS — R4701 Aphasia: Secondary | ICD-10-CM | POA: Diagnosis not present

## 2019-09-02 DIAGNOSIS — I69951 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: Secondary | ICD-10-CM | POA: Diagnosis not present

## 2019-09-02 DIAGNOSIS — R278 Other lack of coordination: Secondary | ICD-10-CM | POA: Diagnosis not present

## 2019-09-07 DIAGNOSIS — M62421 Contracture of muscle, right upper arm: Secondary | ICD-10-CM | POA: Diagnosis not present

## 2019-09-07 DIAGNOSIS — R4701 Aphasia: Secondary | ICD-10-CM | POA: Diagnosis not present

## 2019-09-07 DIAGNOSIS — R2681 Unsteadiness on feet: Secondary | ICD-10-CM | POA: Diagnosis not present

## 2019-09-07 DIAGNOSIS — Z9181 History of falling: Secondary | ICD-10-CM | POA: Diagnosis not present

## 2019-09-07 DIAGNOSIS — M6281 Muscle weakness (generalized): Secondary | ICD-10-CM | POA: Diagnosis not present

## 2019-09-07 DIAGNOSIS — R278 Other lack of coordination: Secondary | ICD-10-CM | POA: Diagnosis not present

## 2019-09-07 DIAGNOSIS — I69951 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: Secondary | ICD-10-CM | POA: Diagnosis not present

## 2019-09-07 DIAGNOSIS — R293 Abnormal posture: Secondary | ICD-10-CM | POA: Diagnosis not present

## 2019-09-07 DIAGNOSIS — R41841 Cognitive communication deficit: Secondary | ICD-10-CM | POA: Diagnosis not present

## 2019-09-08 DIAGNOSIS — R4701 Aphasia: Secondary | ICD-10-CM | POA: Diagnosis not present

## 2019-09-08 DIAGNOSIS — R41841 Cognitive communication deficit: Secondary | ICD-10-CM | POA: Diagnosis not present

## 2019-09-08 DIAGNOSIS — R278 Other lack of coordination: Secondary | ICD-10-CM | POA: Diagnosis not present

## 2019-09-08 DIAGNOSIS — R2681 Unsteadiness on feet: Secondary | ICD-10-CM | POA: Diagnosis not present

## 2019-09-08 DIAGNOSIS — I69951 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: Secondary | ICD-10-CM | POA: Diagnosis not present

## 2019-09-08 DIAGNOSIS — Z9181 History of falling: Secondary | ICD-10-CM | POA: Diagnosis not present

## 2019-09-08 DIAGNOSIS — M62421 Contracture of muscle, right upper arm: Secondary | ICD-10-CM | POA: Diagnosis not present

## 2019-09-08 DIAGNOSIS — R293 Abnormal posture: Secondary | ICD-10-CM | POA: Diagnosis not present

## 2019-09-08 DIAGNOSIS — M6281 Muscle weakness (generalized): Secondary | ICD-10-CM | POA: Diagnosis not present

## 2019-09-09 DIAGNOSIS — M62421 Contracture of muscle, right upper arm: Secondary | ICD-10-CM | POA: Diagnosis not present

## 2019-09-09 DIAGNOSIS — M6281 Muscle weakness (generalized): Secondary | ICD-10-CM | POA: Diagnosis not present

## 2019-09-09 DIAGNOSIS — R4701 Aphasia: Secondary | ICD-10-CM | POA: Diagnosis not present

## 2019-09-09 DIAGNOSIS — R293 Abnormal posture: Secondary | ICD-10-CM | POA: Diagnosis not present

## 2019-09-09 DIAGNOSIS — Z9181 History of falling: Secondary | ICD-10-CM | POA: Diagnosis not present

## 2019-09-09 DIAGNOSIS — R41841 Cognitive communication deficit: Secondary | ICD-10-CM | POA: Diagnosis not present

## 2019-09-09 DIAGNOSIS — R2681 Unsteadiness on feet: Secondary | ICD-10-CM | POA: Diagnosis not present

## 2019-09-09 DIAGNOSIS — R278 Other lack of coordination: Secondary | ICD-10-CM | POA: Diagnosis not present

## 2019-09-09 DIAGNOSIS — I69951 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: Secondary | ICD-10-CM | POA: Diagnosis not present

## 2019-09-13 DIAGNOSIS — R278 Other lack of coordination: Secondary | ICD-10-CM | POA: Diagnosis not present

## 2019-09-13 DIAGNOSIS — R4701 Aphasia: Secondary | ICD-10-CM | POA: Diagnosis not present

## 2019-09-13 DIAGNOSIS — M6281 Muscle weakness (generalized): Secondary | ICD-10-CM | POA: Diagnosis not present

## 2019-09-13 DIAGNOSIS — I69951 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: Secondary | ICD-10-CM | POA: Diagnosis not present

## 2019-09-13 DIAGNOSIS — R293 Abnormal posture: Secondary | ICD-10-CM | POA: Diagnosis not present

## 2019-09-13 DIAGNOSIS — R41841 Cognitive communication deficit: Secondary | ICD-10-CM | POA: Diagnosis not present

## 2019-09-13 DIAGNOSIS — Z9181 History of falling: Secondary | ICD-10-CM | POA: Diagnosis not present

## 2019-09-13 DIAGNOSIS — R2681 Unsteadiness on feet: Secondary | ICD-10-CM | POA: Diagnosis not present

## 2019-09-13 DIAGNOSIS — M62421 Contracture of muscle, right upper arm: Secondary | ICD-10-CM | POA: Diagnosis not present

## 2019-09-15 DIAGNOSIS — I69951 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: Secondary | ICD-10-CM | POA: Diagnosis not present

## 2019-09-15 DIAGNOSIS — M6281 Muscle weakness (generalized): Secondary | ICD-10-CM | POA: Diagnosis not present

## 2019-09-15 DIAGNOSIS — Z9181 History of falling: Secondary | ICD-10-CM | POA: Diagnosis not present

## 2019-09-15 DIAGNOSIS — R41841 Cognitive communication deficit: Secondary | ICD-10-CM | POA: Diagnosis not present

## 2019-09-15 DIAGNOSIS — R278 Other lack of coordination: Secondary | ICD-10-CM | POA: Diagnosis not present

## 2019-09-15 DIAGNOSIS — R4701 Aphasia: Secondary | ICD-10-CM | POA: Diagnosis not present

## 2019-09-15 DIAGNOSIS — R2681 Unsteadiness on feet: Secondary | ICD-10-CM | POA: Diagnosis not present

## 2019-09-15 DIAGNOSIS — R293 Abnormal posture: Secondary | ICD-10-CM | POA: Diagnosis not present

## 2019-09-15 DIAGNOSIS — M62421 Contracture of muscle, right upper arm: Secondary | ICD-10-CM | POA: Diagnosis not present

## 2019-09-16 DIAGNOSIS — R293 Abnormal posture: Secondary | ICD-10-CM | POA: Diagnosis not present

## 2019-09-16 DIAGNOSIS — Z9181 History of falling: Secondary | ICD-10-CM | POA: Diagnosis not present

## 2019-09-16 DIAGNOSIS — M62421 Contracture of muscle, right upper arm: Secondary | ICD-10-CM | POA: Diagnosis not present

## 2019-09-16 DIAGNOSIS — M6281 Muscle weakness (generalized): Secondary | ICD-10-CM | POA: Diagnosis not present

## 2019-09-16 DIAGNOSIS — R4701 Aphasia: Secondary | ICD-10-CM | POA: Diagnosis not present

## 2019-09-16 DIAGNOSIS — I69951 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: Secondary | ICD-10-CM | POA: Diagnosis not present

## 2019-09-16 DIAGNOSIS — R41841 Cognitive communication deficit: Secondary | ICD-10-CM | POA: Diagnosis not present

## 2019-09-16 DIAGNOSIS — R278 Other lack of coordination: Secondary | ICD-10-CM | POA: Diagnosis not present

## 2019-09-16 DIAGNOSIS — R2681 Unsteadiness on feet: Secondary | ICD-10-CM | POA: Diagnosis not present

## 2019-09-17 DIAGNOSIS — R4701 Aphasia: Secondary | ICD-10-CM | POA: Diagnosis not present

## 2019-09-17 DIAGNOSIS — R278 Other lack of coordination: Secondary | ICD-10-CM | POA: Diagnosis not present

## 2019-09-17 DIAGNOSIS — I69951 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: Secondary | ICD-10-CM | POA: Diagnosis not present

## 2019-09-17 DIAGNOSIS — Z9181 History of falling: Secondary | ICD-10-CM | POA: Diagnosis not present

## 2019-09-17 DIAGNOSIS — M6281 Muscle weakness (generalized): Secondary | ICD-10-CM | POA: Diagnosis not present

## 2019-09-17 DIAGNOSIS — M62421 Contracture of muscle, right upper arm: Secondary | ICD-10-CM | POA: Diagnosis not present

## 2019-09-17 DIAGNOSIS — R293 Abnormal posture: Secondary | ICD-10-CM | POA: Diagnosis not present

## 2019-09-17 DIAGNOSIS — R2681 Unsteadiness on feet: Secondary | ICD-10-CM | POA: Diagnosis not present

## 2019-09-17 DIAGNOSIS — R41841 Cognitive communication deficit: Secondary | ICD-10-CM | POA: Diagnosis not present

## 2019-09-19 IMAGING — US US ABDOMEN COMPLETE
1 series · 13 of 25 positions shown · non-contrast
Comparison: 08/06/2018

CLINICAL DATA: Elevated LFTs

EXAM:
ABDOMEN ULTRASOUND COMPLETE

[Series 1: us abdomen complete · 0.22mm/px · 13 of 105 slices shown]
[im 1/105]
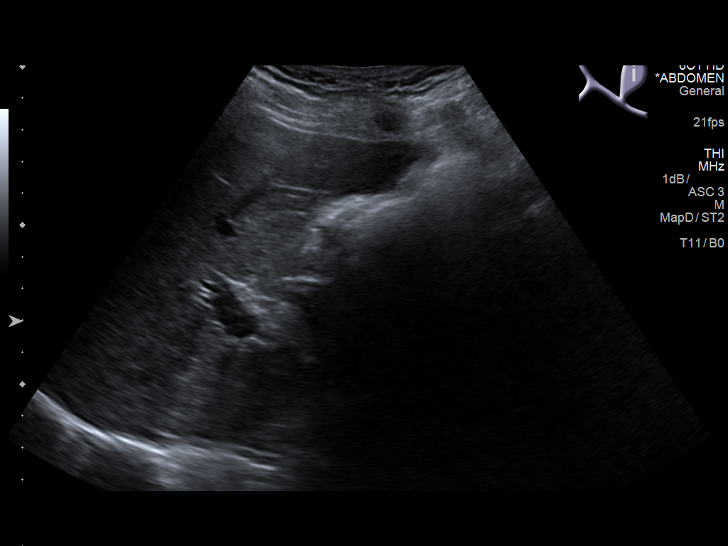
[im 9/105]
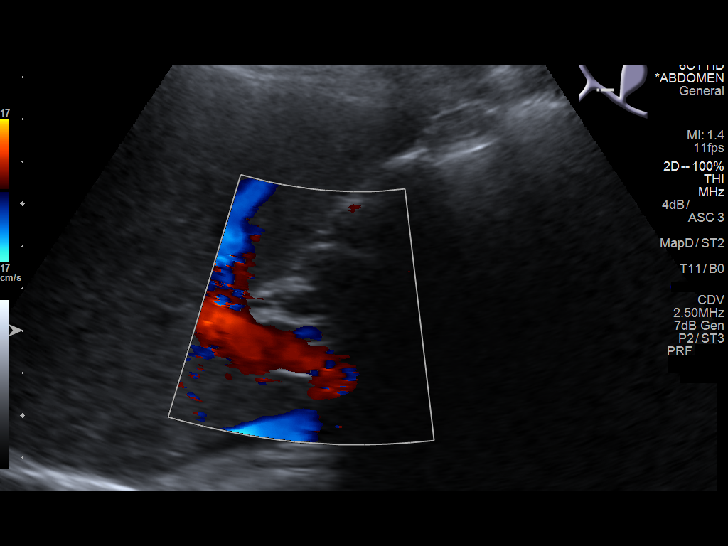
[im 18/105]
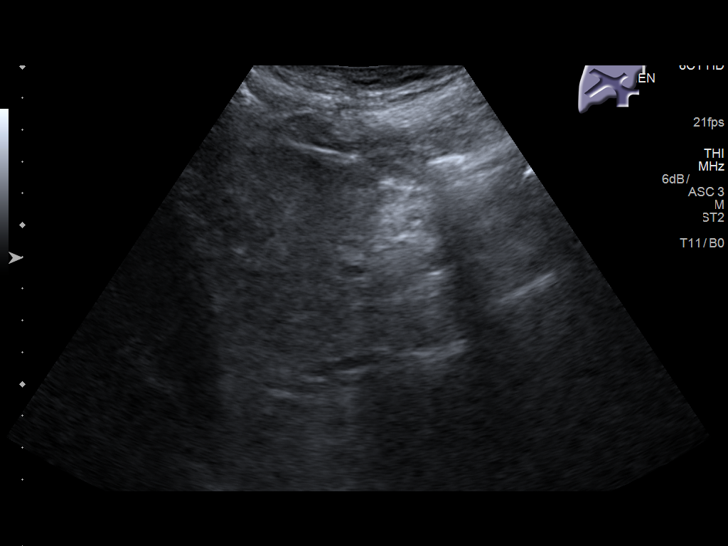
[im 27/105]
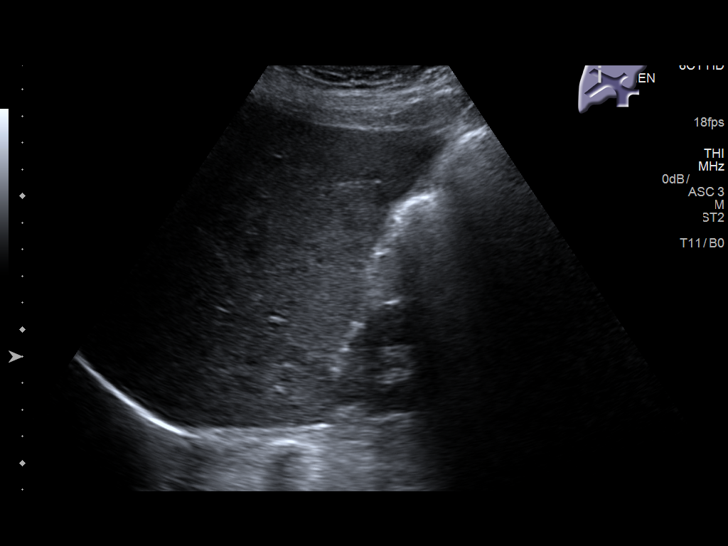
[im 35/105]
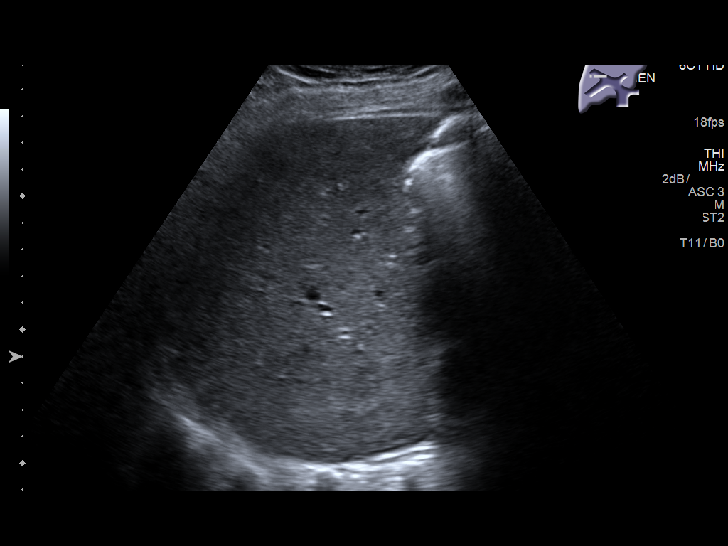
[im 44/105]
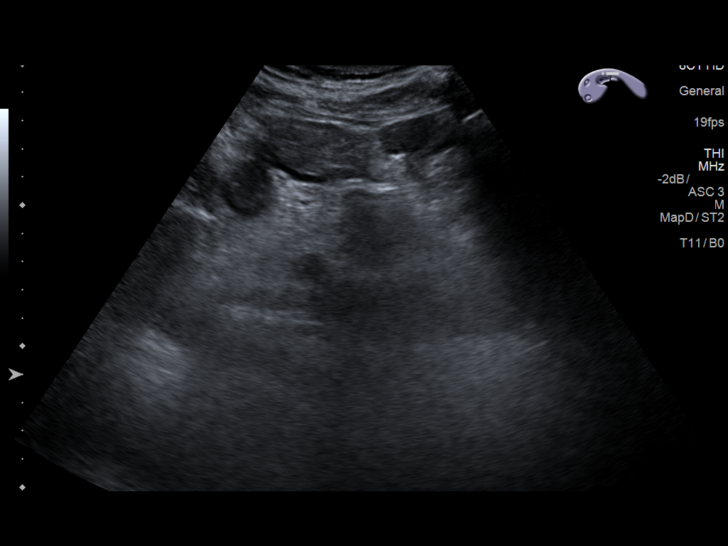
[im 53/105]
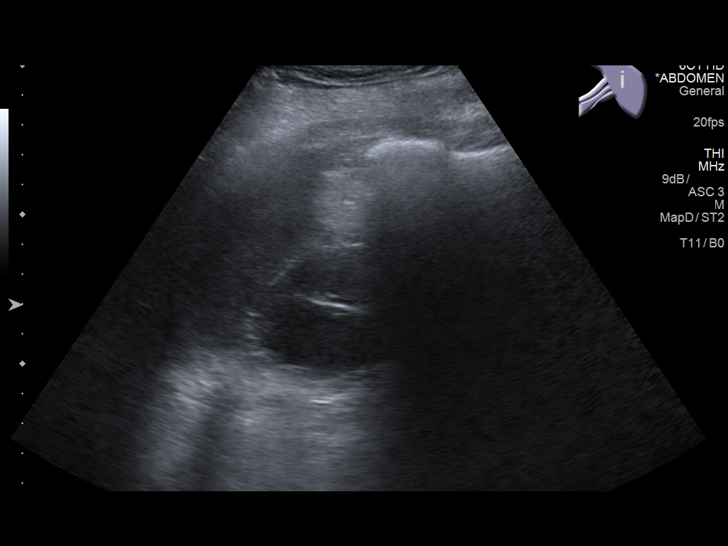
[im 61/105]
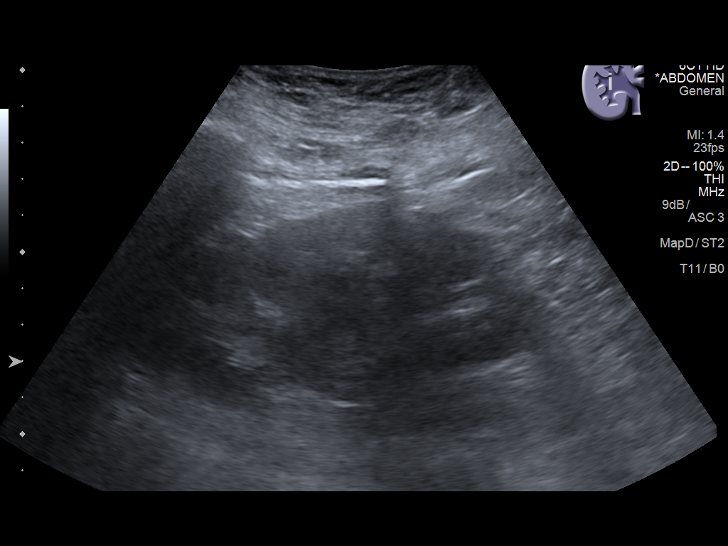
[im 70/105]
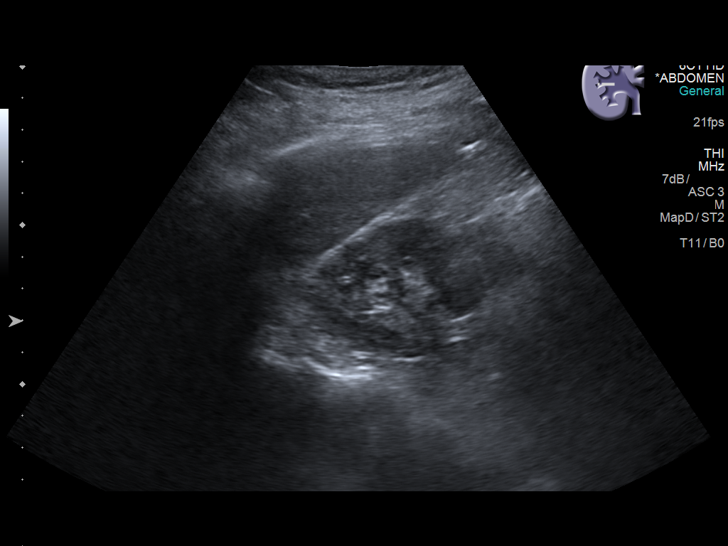
[im 79/105]
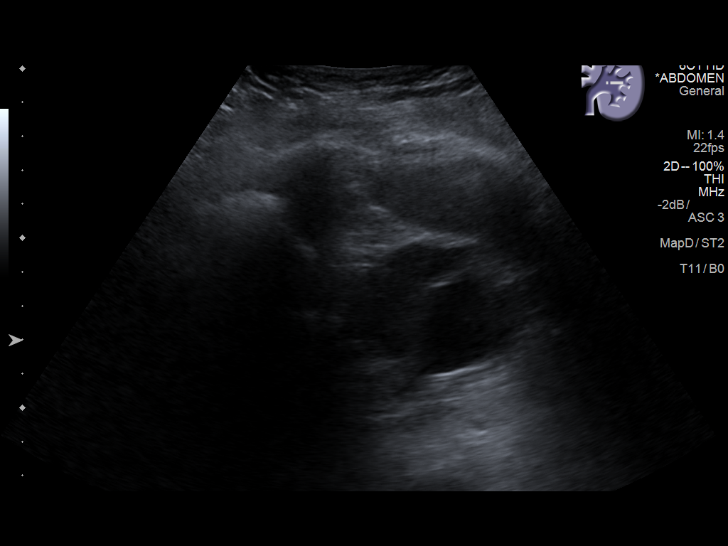
[im 87/105]
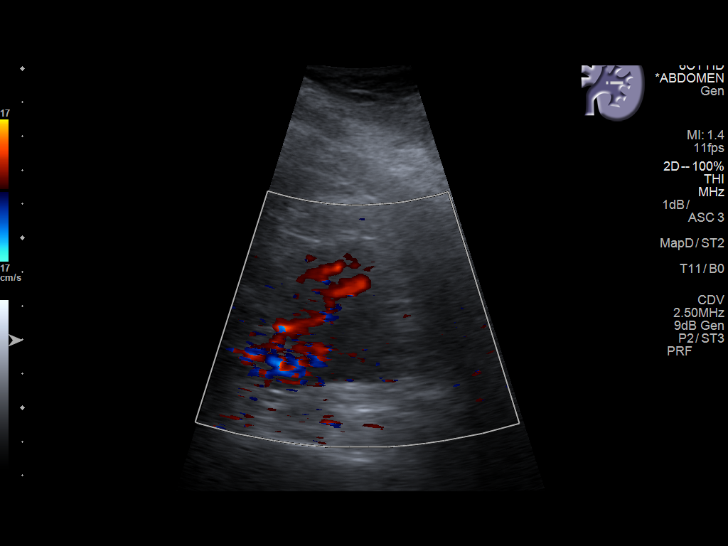
[im 96/105]
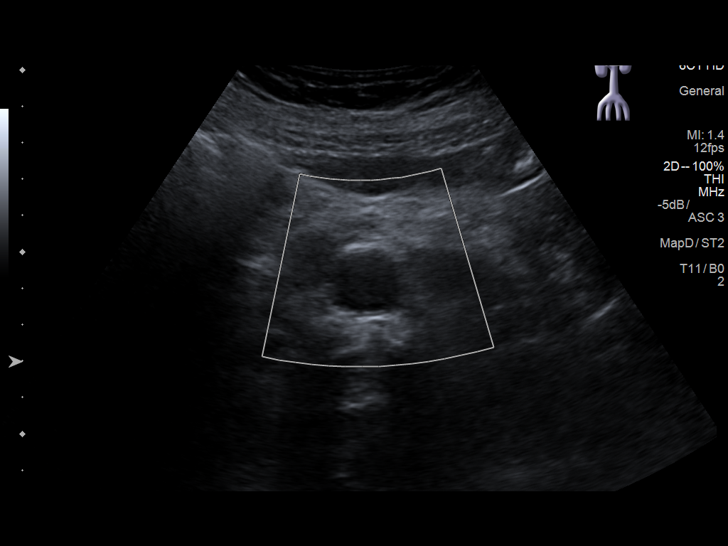
[im 105/105]
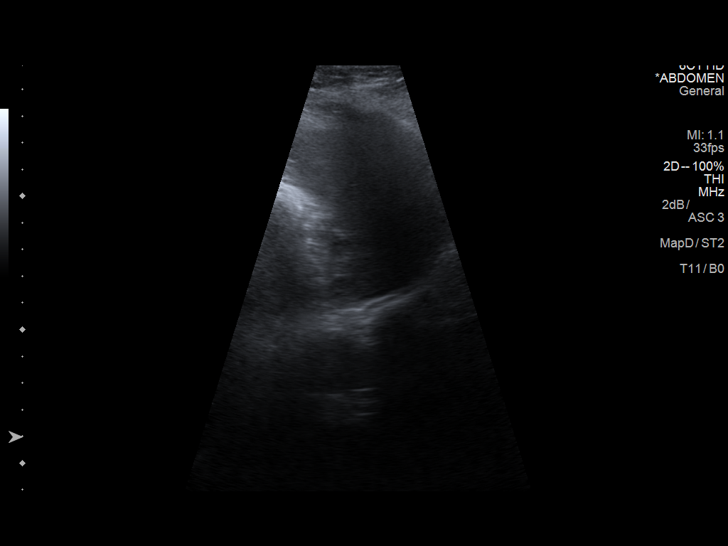

[13 of 25 positions shown; findings below may reference images not displayed]

FINDINGS: Gallbladder: Surgically removed.

Common bile duct: Diameter: 5.7 mm.

Liver: Mild heterogeneity is noted without focal mass. Portal vein
is patent on color Doppler imaging with normal direction of blood
flow towards the liver.

IVC: No abnormality visualized.

Pancreas: Visualized portion unremarkable.

Spleen: Size and appearance within normal limits.

Right Kidney: Length: 11.1 cm.. Mild increased echogenicity is
noted. No hydronephrosis or mass lesion is noted.

Left Kidney: Length: 10.4 cm. Increased echogenicity is noted.
cm hypoechoic lesion is identified which corresponds to an
abnormality seen on prior CT examination. This does not represent a
simple cyst and may represent a complex cyst or focal mass lesion.

Abdominal aorta: No aneurysm visualized.

Other findings: None.
IMPRESSION: Increased echogenicity within the kidneys bilaterally consistent
with medical renal disease.

3.4 cm hypoechoic lesion within the left kidney which does not
represent a simple cyst. This may represent a complex cyst or mass
lesion. MRI is again recommended for further evaluation.

Heterogeneity of the liver without focal mass.

## 2019-09-20 DIAGNOSIS — R2681 Unsteadiness on feet: Secondary | ICD-10-CM | POA: Diagnosis not present

## 2019-09-20 DIAGNOSIS — R41841 Cognitive communication deficit: Secondary | ICD-10-CM | POA: Diagnosis not present

## 2019-09-20 DIAGNOSIS — M62421 Contracture of muscle, right upper arm: Secondary | ICD-10-CM | POA: Diagnosis not present

## 2019-09-20 DIAGNOSIS — R278 Other lack of coordination: Secondary | ICD-10-CM | POA: Diagnosis not present

## 2019-09-20 DIAGNOSIS — R293 Abnormal posture: Secondary | ICD-10-CM | POA: Diagnosis not present

## 2019-09-20 DIAGNOSIS — M6281 Muscle weakness (generalized): Secondary | ICD-10-CM | POA: Diagnosis not present

## 2019-09-20 DIAGNOSIS — I69951 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: Secondary | ICD-10-CM | POA: Diagnosis not present

## 2019-09-20 DIAGNOSIS — Z9181 History of falling: Secondary | ICD-10-CM | POA: Diagnosis not present

## 2019-09-20 DIAGNOSIS — R4701 Aphasia: Secondary | ICD-10-CM | POA: Diagnosis not present

## 2019-09-21 DIAGNOSIS — I69951 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: Secondary | ICD-10-CM | POA: Diagnosis not present

## 2019-09-21 DIAGNOSIS — R293 Abnormal posture: Secondary | ICD-10-CM | POA: Diagnosis not present

## 2019-09-21 DIAGNOSIS — R41841 Cognitive communication deficit: Secondary | ICD-10-CM | POA: Diagnosis not present

## 2019-09-21 DIAGNOSIS — R2681 Unsteadiness on feet: Secondary | ICD-10-CM | POA: Diagnosis not present

## 2019-09-21 DIAGNOSIS — R4701 Aphasia: Secondary | ICD-10-CM | POA: Diagnosis not present

## 2019-09-21 DIAGNOSIS — Z9181 History of falling: Secondary | ICD-10-CM | POA: Diagnosis not present

## 2019-09-21 DIAGNOSIS — M62421 Contracture of muscle, right upper arm: Secondary | ICD-10-CM | POA: Diagnosis not present

## 2019-09-21 DIAGNOSIS — R278 Other lack of coordination: Secondary | ICD-10-CM | POA: Diagnosis not present

## 2019-09-21 DIAGNOSIS — M6281 Muscle weakness (generalized): Secondary | ICD-10-CM | POA: Diagnosis not present

## 2019-10-05 ENCOUNTER — Other Ambulatory Visit: Payer: Self-pay

## 2019-10-05 ENCOUNTER — Ambulatory Visit
Admission: RE | Admit: 2019-10-05 | Discharge: 2019-10-05 | Disposition: A | Discharge status: Home / Self Care | Payer: Medicare HMO | Source: Ambulatory Visit | Attending: Family Medicine | Admitting: Family Medicine

## 2019-10-05 ENCOUNTER — Ambulatory Visit: Payer: Medicare HMO

## 2019-10-05 DIAGNOSIS — Z1231 Encounter for screening mammogram for malignant neoplasm of breast: Secondary | ICD-10-CM

## 2019-11-13 MED ORDER — VENLAFAXINE HCL ER 150 MG PO CP24
150.00 | ORAL_CAPSULE | ORAL | Status: DC
Start: 2019-11-14 — End: 2019-11-13

## 2019-11-13 MED ORDER — LOSARTAN POTASSIUM 50 MG PO TABS
50.00 | ORAL_TABLET | ORAL | Status: DC
Start: 2019-11-14 — End: 2019-11-13

## 2019-11-13 MED ORDER — PCCA BIOPEPTIDE BASE EX CREA
300.00 | TOPICAL_CREAM | CUTANEOUS | Status: DC
Start: 2019-11-13 — End: 2019-11-13

## 2019-11-13 MED ORDER — LEVETIRACETAM 500 MG PO TABS
2000.00 | ORAL_TABLET | ORAL | Status: DC
Start: 2019-11-13 — End: 2019-11-13

## 2019-11-13 MED ORDER — BISACODYL 5 MG PO TBEC
10.00 | DELAYED_RELEASE_TABLET | ORAL | Status: DC
Start: 2019-11-14 — End: 2019-11-13

## 2019-11-13 MED ORDER — MELATONIN 3 MG PO TABS
3.00 | ORAL_TABLET | ORAL | Status: DC
Start: ? — End: 2019-11-13

## 2019-11-13 MED ORDER — LEVOTHYROXINE SODIUM 88 MCG PO TABS
88.00 | ORAL_TABLET | ORAL | Status: DC
Start: 2019-11-14 — End: 2019-11-13

## 2019-11-13 MED ORDER — EMBRACE PUMP SET-PIERCING PIN MISC
1.00 | Status: DC
Start: ? — End: 2019-11-13

## 2019-11-13 MED ORDER — CVS EAR DROPS OT
40.00 | OTIC | Status: DC
Start: 2019-11-13 — End: 2019-11-13

## 2019-11-13 MED ORDER — PSE 120 120-600 MG OR TB12
2.00 | ORAL_TABLET | ORAL | Status: DC
Start: ? — End: 2019-11-13

## 2019-11-13 MED ORDER — MIRTAZAPINE 15 MG PO TABS
30.00 | ORAL_TABLET | ORAL | Status: DC
Start: 2019-11-13 — End: 2019-11-13

## 2019-11-13 MED ORDER — ACETAMINOPHEN 325 MG PO TABS
650.00 | ORAL_TABLET | ORAL | Status: DC
Start: ? — End: 2019-11-13

## 2019-11-13 MED ORDER — JOBST ULTRASHEER 30-40MMHG XL MISC
12.50 | Status: DC
Start: 2019-11-14 — End: 2019-11-13

## 2019-11-13 MED ORDER — PANTOPRAZOLE SODIUM 40 MG PO TBEC
40.00 | DELAYED_RELEASE_TABLET | ORAL | Status: DC
Start: 2019-11-14 — End: 2019-11-13

## 2019-11-13 MED ORDER — CYANOCOBALAMIN 100 MCG PO LOZG
200.00 | LOZENGE | ORAL | Status: DC
Start: 2019-11-13 — End: 2019-11-13

## 2019-11-13 MED ORDER — POLYETHYLENE GLYCOL 3350 17 GM/SCOOP PO POWD
17.00 | ORAL | Status: DC
Start: 2019-11-13 — End: 2019-11-13

## 2019-11-13 MED ORDER — DSS 100 MG PO CAPS
100.00 | ORAL_CAPSULE | ORAL | Status: DC
Start: 2019-11-13 — End: 2019-11-13

## 2019-11-15 ENCOUNTER — Other Ambulatory Visit: Payer: Self-pay

## 2019-11-15 ENCOUNTER — Emergency Department (HOSPITAL_COMMUNITY)
Admission: EM | Admit: 2019-11-15 | Discharge: 2019-11-15 | Disposition: A | Discharge status: Home / Self Care | Payer: Medicare HMO | Attending: Emergency Medicine | Admitting: Emergency Medicine

## 2019-11-15 ENCOUNTER — Emergency Department (HOSPITAL_COMMUNITY): Payer: Medicare HMO

## 2019-11-15 ENCOUNTER — Encounter (HOSPITAL_COMMUNITY): Payer: Self-pay | Admitting: Emergency Medicine

## 2019-11-15 DIAGNOSIS — E1122 Type 2 diabetes mellitus with diabetic chronic kidney disease: Secondary | ICD-10-CM | POA: Diagnosis not present

## 2019-11-15 DIAGNOSIS — J45909 Unspecified asthma, uncomplicated: Secondary | ICD-10-CM | POA: Insufficient documentation

## 2019-11-15 DIAGNOSIS — S82831A Other fracture of upper and lower end of right fibula, initial encounter for closed fracture: Secondary | ICD-10-CM | POA: Diagnosis not present

## 2019-11-15 DIAGNOSIS — M79604 Pain in right leg: Secondary | ICD-10-CM

## 2019-11-15 DIAGNOSIS — W050XXA Fall from non-moving wheelchair, initial encounter: Secondary | ICD-10-CM | POA: Diagnosis not present

## 2019-11-15 DIAGNOSIS — Y92122 Bedroom in nursing home as the place of occurrence of the external cause: Secondary | ICD-10-CM | POA: Diagnosis not present

## 2019-11-15 DIAGNOSIS — Z950 Presence of cardiac pacemaker: Secondary | ICD-10-CM | POA: Insufficient documentation

## 2019-11-15 DIAGNOSIS — S82101A Unspecified fracture of upper end of right tibia, initial encounter for closed fracture: Secondary | ICD-10-CM | POA: Diagnosis not present

## 2019-11-15 DIAGNOSIS — S8991XA Unspecified injury of right lower leg, initial encounter: Secondary | ICD-10-CM | POA: Diagnosis present

## 2019-11-15 DIAGNOSIS — Z87891 Personal history of nicotine dependence: Secondary | ICD-10-CM | POA: Insufficient documentation

## 2019-11-15 DIAGNOSIS — I129 Hypertensive chronic kidney disease with stage 1 through stage 4 chronic kidney disease, or unspecified chronic kidney disease: Secondary | ICD-10-CM | POA: Diagnosis not present

## 2019-11-15 DIAGNOSIS — Z7901 Long term (current) use of anticoagulants: Secondary | ICD-10-CM | POA: Diagnosis not present

## 2019-11-15 DIAGNOSIS — Y999 Unspecified external cause status: Secondary | ICD-10-CM | POA: Diagnosis not present

## 2019-11-15 DIAGNOSIS — Z79899 Other long term (current) drug therapy: Secondary | ICD-10-CM | POA: Diagnosis not present

## 2019-11-15 DIAGNOSIS — E039 Hypothyroidism, unspecified: Secondary | ICD-10-CM | POA: Insufficient documentation

## 2019-11-15 DIAGNOSIS — Y9389 Activity, other specified: Secondary | ICD-10-CM | POA: Insufficient documentation

## 2019-11-15 DIAGNOSIS — N189 Chronic kidney disease, unspecified: Secondary | ICD-10-CM | POA: Diagnosis not present

## 2019-11-15 MED ORDER — HYDROCODONE-ACETAMINOPHEN 5-325 MG PO TABS: 1.0000 | ORAL_TABLET | ORAL | 0 refills | Status: DC | PRN

## 2019-11-15 MED ORDER — HYDROCODONE-ACETAMINOPHEN 5-325 MG PO TABS
1.0000 | ORAL_TABLET | Freq: Once | ORAL | Status: AC
  Administered 2019-11-15: 1 via ORAL
  Filled 2019-11-15: qty 1

## 2019-11-15 NOTE — ED Provider Notes (Signed)
[facility] EMERGENCY DEPARTMENT Provider Note   CSN: [id number] Arrival date & time: 11/15/19  1351     History Chief Complaint  Patient presents with   Leg Pain    Right    Kathleen Jensen is a 69 y.o. 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  I reviewed patient's medical record and she was recently evaluated on 11/11/2019 at [facility] after sustaining a mechanical fall at her nursing facility.  Initial imaging obtained at the nursing home revealed evidence of proximal tibial fracture extending from medial cortex of lateral tibial plateau, however work-up obtained at the hospital was negative for fracture or other acute osseous abnormalities.  No repeat plain films were obtained of tib/fib.  CT was obtained of hip which was negative, but no CT obtained of knee to evaluate for tibial plateau fracture.  Patient was sent here from the NP at [facility] for pain control in setting of her newly diagnosed fractures.  On my examination, patient is resting comfortably.  However, she does endorse significant pain discomfort with palpation.  She states that she has not been able to move her right leg very much ever since she had her stroke.  Obtained history from Merwyn Katos NP from [facility] who reports that patient fell during transfer from her wheelchair to the bed.  She has been flaccid on her entire right side, including leg, chronically.  Patient has not been taking any pain medication at the facility aside from Tylenol which has led to continued significant discomfort.  Patient has been very tender with any assistance at the facility and they are concerned about potential complications such as bedsores if they are unable to roll her or assist her with ADLs.  She did not have any  supportive brace or other measures with her at time of my examination.  HPI     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)    Hyperlipemia    Hypertension    Insomnia    Intracerebral hemorrhage, intraventricular (HCC)    Muscle weakness    Other abnormalities of gait and mobility    Seizures (HCC)    Stroke (HCC)    Unsteadiness on feet    Vitamin B deficiency     Patient Active Problem List   Diagnosis Date Noted   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) 07/30/2018   Hyperlipidemia associated with type 2 diabetes mellitus (HCC) 07/30/2018   History of cerebral infarction 07/30/2018   Vitamin B 12 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/30/2018   Asthma 03/13/2018   Seizures (HCC) 01/12/2018   CVA (cerebral vascular accident) (HCC) 12/08/2017   Abnormal thyroid blood test 12/08/2017   Pulmonary embolism and infarction (HCC) 12/08/2017   Anemia 12/08/2017   Expressive aphasia 10/15/2017   Seizure disorder (HCC) 10/15/2017   Dysphagia 10/15/2017   Diabetes mellitus without complication (HCC)    Hyperlipidemia  09/22/2017   Urinary frequency 09/22/2017   Pre-operative clearance 11/19/2016   Airway hyperreactivity 01/26/2016   Disease of thyroid gland 01/26/2016   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   Other specified postprocedural states 01/26/2016   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   Chronic LBP 04/10/2015   Degeneration of intervertebral disc of lumbar region 04/10/2015   Spondylolisthesis of lumbar region 04/10/2015   Degenerative  arthritis of lumbar spine 04/10/2015   History of cardiac pacemaker in situ 01/09/2015   B12 DEFICIENCY 08/15/2008   UNSPECIFIED VITAMIN D DEFICIENCY 08/15/2008   SINUS BRADYCARDIA 08/15/2008   OBSTRUCTIVE SLEEP APNEA 08/11/2008   INSOMNIA 04/28/2008   Hyperlipidemia LDL goal <70 12/12/2006   RHINITIS, ALLERGIC NEC 12/12/2006   Hypothyroidism 12/11/2006   Essential hypertension 12/11/2006   GERD 12/11/2006   STRESS INCONTINENCE 12/11/2006    Past Surgical History:  Procedure Laterality Date   BACK SURGERY     Dec 18, 2016 Dr.Torrealba   BREAST BIOPSY Right    BREAST EXCISIONAL BIOPSY     CERVICAL SPINE SURGERY     C4   CESAREAN SECTION     GASTRIC BYPASS  03/2007   TONSILLECTOMY       OB History   No obstetric history on file.     Family History  Problem Relation Age of Onset   High blood pressure Mother    Diabetes Mother    Diabetes Father    Stroke Father    Cancer Sister        Colorectal cancer   Cancer Brother        colon    Social History   Tobacco Use   Smoking status: Former Smoker    Years: 4.00    Quit date: 07/30/1979    Years since quitting: 40.3   Smokeless tobacco: Never Used  Substance Use Topics   Alcohol use: No    Alcohol/week: 0.0 standard drinks   Drug use: No    Home Medications Prior to Admission medications   Medication Sig Start Date End Date Taking? Authorizing Provider  acetaminophen (TYLENOL) 325 MG tablet Take 1 tablet by mouth every 8 (eight) hours as needed.    [provider]  albuterol (PROVENTIL HFA;VENTOLIN HFA) 108 (90 Base) MCG/ACT inhaler Inhale 2 puffs into the lungs every 6 (six) hours as needed for wheezing or shortness of breath. 03/13/18   Donato Schultz, DO  calcium carbonate (OS-CAL - DOSED IN MG OF ELEMENTAL CALCIUM) 1250 (500 Ca) MG tablet 1 tablet daily.    [provider]  gabapentin (NEURONTIN) 300 MG capsule Take 1 capsule by mouth 3 (three) times  daily. 01/21/18   [provider]  hydrochlorothiazide (MICROZIDE) 12.5 MG capsule TAKE 1 CAPSULE (12.5 MG TOTAL) BY MOUTH DAILY. 06/01/18   Donato Schultz, DO  levETIRAcetam (KEPPRA) 1000 MG tablet 2 po bid 12/11/17   Zola Button, Grayling Congress, DO  levothyroxine (SYNTHROID, LEVOTHROID) 88 MCG tablet Take 1 tablet (88 mcg total) by mouth daily before breakfast. 01/12/18   Zola Button, Grayling Congress, DO  losartan (COZAAR) 100 MG tablet Take 1 tablet (100 mg total) by mouth daily. 04/21/18   Seabron Spates R, DO  magnesium oxide (MAG-OX) 400 MG tablet Take 400 mg by mouth 2 (two) times daily.    [provider]  mirtazapine (REMERON) 30 MG tablet Take 1 tablet (30 mg total) by mouth at bedtime. 04/22/18   Donato Schultz, DO  Misc. Devices (TRANSFER BOARD) MISC  1 each by Does not apply route as needed (For Bed Transfer). 04/23/18   Donato Schultz, DO  pantoprazole (PROTONIX) 40 MG tablet Take 1 tablet (40 mg total) by mouth daily. 01/12/18   Donato Schultz, DO  phenytoin (DILANTIN) 50 MG tablet 200mg  in the morning and 200mg  in the evening 02/11/18   , MD  polyethylene glycol [facility] Wanda Plump) packet Take 17 g by mouth daily as needed.     [provider]  senna-docusate (SENOKOT-S) 8.6-50 MG tablet Take 1 tablet by mouth 2 (two) times daily.    [provider]  tiZANidine (ZANAFLEX) 4 MG tablet Take 1 tablet (4 mg total) by mouth every 6 (six) hours as needed for muscle spasms. 03/03/18   11-04-1978, DO  valACYclovir (VALTREX) 1000 MG tablet Take 1 tablet (1,000 mg total) by mouth 3 (three) times daily. 03/03/18   Donato Schultz, DO  warfarin (COUMADIN) 5 MG tablet 10 mg on mon and 5 mg all other days Patient taking differently: TAKE AS DIRECTED BY COUMADIN CLINIC 12/08/17   Donato Schultz, DO    Allergies    Tape, Meperidine hcl, Sulfonamide derivatives, Bacitracin-polymyxin b, and Oxycodone  Review of Systems    Review of Systems  All other systems reviewed and are negative.   Physical Exam Updated Vital Signs BP 136/84 (BP Location: Left Arm)    Pulse 62    Temp 98.7 F (37.1 C) (Oral)    Resp 18    Ht 5\' 4"  (1.626 m)    Wt 112.5 kg    SpO2 98%    BMI 42.57 kg/m   Physical Exam Vitals and nursing note reviewed. Exam conducted with a chaperone present.  Constitutional:      Appearance: Normal appearance.     Comments: In discomfort.  HENT:     Head: Normocephalic and atraumatic.  Eyes:     General: No scleral icterus.    Conjunctiva/sclera: Conjunctivae normal.  Cardiovascular:     Rate and Rhythm: Normal rate and regular rhythm.     Pulses: Normal pulses.  Pulmonary:     Effort: Pulmonary effort is normal.     Breath sounds: Normal breath sounds.  Musculoskeletal:     Cervical back: Normal range of motion. No rigidity.     Comments: Right leg: Tenderness to palpation along distal femur as well as entire length of tibia, most notably over proximal tibia.  Fibular TTP, as well.  No significant ankle or metatarsal tenderness.  Edema and swelling present, but soft compartments.  Pedal pulse intact.  Patient does not demonstrate ROM.  Chronic right-sided deficits from stroke.  Skin:    General: Skin is dry.     Capillary Refill: Capillary refill takes less than 2 seconds.  Neurological:     Mental Status: She is alert and oriented to person, place, and time.     GCS: GCS eye subscore is 4. GCS verbal subscore is 5. GCS motor subscore is 6.  Psychiatric:        Mood and Affect: Mood normal.        Behavior: Behavior normal.        Thought Content: Thought content normal.      ED Results / Procedures / Treatments   Labs (all labs ordered are listed, but only abnormal results are displayed) Labs Reviewed - No data to display  EKG None  Radiology DG Tibia/Fibula Right  Result Date: 11/15/2019 CLINICAL DATA:  Pain after falls. EXAM: RIGHT TIBIA AND FIBULA - 2 VIEW COMPARISON:   None. FINDINGS: Minimally displaced fracture is seen involving the proximal tibia. Minimally displaced fracture is also seen involving the proximal right fibula. No soft tissue abnormality is noted. IMPRESSION: Minimally displaced proximal right tibial and fibular fractures. Electronically Signed   By: Marijo Conception M.D.   On: 11/15/2019 15:33    Procedures Procedures (including critical care time)  Medications Ordered in ED Medications  HYDROcodone-acetaminophen (NORCO/VICODIN) 5-325 MG per tablet 1 tablet (has no administration in time range)    ED Course  I have reviewed the triage vital signs and the nursing notes.  Pertinent labs & imaging results that were available during my care of the patient were reviewed by me and considered in my medical decision making (see chart for details).    MDM Rules/Calculators/A&P                      Patient's lower leg is concerning for fracture.  Will obtain plain films of tibia/fibula as well as CT of her right knee to assess for tibial plateau fracture.  Vicodin 11/29/2023 provided here in the ED for patient's symptoms of pain.  Pedal pulse intact and compartments are soft.  Low suspicion for compartment syndrome, clot, or other emergent pathology.  At shift change care was transferred to Janetta Hora, PA-C who will follow pending studies, re-evaluate, and determine disposition.    Final Clinical Impression(s) / ED Diagnoses Final diagnoses:  Right leg pain    Rx / DC Orders ED Discharge Orders    None       Corena Herter, PA-C 11/15/19 1546    Davonna Belling, MD 11/15/19 1551

## 2019-11-15 NOTE — ED Triage Notes (Signed)
Patient presents via ems from [facility], awake and alert x 3 (person,place & event) c/o Right leg pain, s/p fall one week ago at nursing home. Per ems; patient was Dx Right Tib/Fib Fx and is being sent to ED for uncontrolled right leg pain.

## 2019-11-15 NOTE — Consult Note (Signed)
Reason for Consult:Right tibia fx Referring Physician: R Little  Finley Kathleen Jensen is an 69 y.o. female.  HPI: Kathleen Jensen comes to the ED today with right knee pain of 3d duration. She fell at the facility where she lives while transferring. She has a history of frequent falls. She is non-ambulatory and hemiplegic on the right side. She reportedly had x-rays after the fall and was diagnosed with a tibia fx. She then went to [facility] where x-rays did not show fx and she was discharged back to the facility. She was sent here 2/2 poorly treated pain.  Past Medical History:  Diagnosis Date  . Aphasia   . Arthritis   . Asthma   . Cardiac pacemaker   . Cerebral amyloid angiopathy (CODE)   . CKD (chronic kidney disease)   . Cognitive communication deficit   . Diabetes mellitus without complication ([place])    resolved after gastric bypass  . GERD (gastroesophageal reflux disease)   . 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 ([place])   . Hyperlipemia   . Hypertension   . Insomnia   . Intracerebral hemorrhage, intraventricular (HCC)   . Muscle weakness   . Other abnormalities of gait and mobility   . Seizures ([place])   . Stroke ([place])   . Unsteadiness on feet   . Vitamin B deficiency     Past Surgical History:  Procedure Laterality Date  . BACK SURGERY     Dec 18, 2016 Dr.Torrealba  . BREAST BIOPSY Right   . BREAST EXCISIONAL BIOPSY    . CERVICAL SPINE SURGERY     C4  . CESAREAN SECTION    . GASTRIC BYPASS  03/2007  . TONSILLECTOMY      Family History  Problem Relation Age of Onset  . High blood pressure Mother   . Diabetes Mother   . Diabetes Father   . Stroke Father   . Cancer Sister        Colorectal cancer  . Cancer Brother        colon    Social History:  reports that she quit smoking about 40 years ago. She quit after 4.00 years of use. She has never used smokeless tobacco. She reports that she does not drink  alcohol or use drugs.  Allergies:  Allergies  Allergen Reactions  . Tape Rash  . Meperidine Hcl Nausea And Vomiting  . Sulfonamide Derivatives Itching  . Bacitracin-Polymyxin B Dermatitis    "Cloth Band-Aid only"  . Oxycodone Itching    Medications: I have reviewed the patient's current medications.  No results found for this or any previous visit (from the past 48 hour(s)).  DG Tibia/Fibula Right  Result Date: 11/15/2019 CLINICAL DATA:  Pain after falls. EXAM: RIGHT TIBIA AND FIBULA - 2 VIEW COMPARISON:  None. FINDINGS: Minimally displaced fracture is seen involving the proximal tibia. Minimally displaced fracture is also seen involving the proximal right fibula. No soft tissue abnormality is noted. IMPRESSION: Minimally displaced proximal right tibial and fibular fractures. Electronically Signed   By: Marijo Conception M.D.   On: 11/15/2019 15:33    Review of Systems  HENT: Negative for ear discharge, ear pain, hearing loss and tinnitus.   Eyes: Negative for photophobia and pain.  Respiratory: Negative for cough and shortness of breath.   Cardiovascular: Negative for chest pain.  Gastrointestinal: Negative for abdominal pain, nausea and vomiting.  Genitourinary: Negative for dysuria, flank pain, frequency and urgency.  Musculoskeletal: Positive for  arthralgias (Right knee). Negative for back pain, myalgias and neck pain.  Neurological: Negative for dizziness and headaches.  Hematological: Does not bruise/bleed easily.  Psychiatric/Behavioral: The patient is not nervous/anxious.    Blood pressure 140/88, pulse 60, temperature 98.7 F (37.1 C), temperature source Oral, resp. rate (!) 21, height 5\' 4"  (1.626 m), weight 112.5 kg, SpO2 97 %. Physical Exam  Constitutional: She appears well-developed and well-nourished. No distress.  HENT:  Head: Normocephalic and atraumatic.  Eyes: Conjunctivae are normal. Right eye exhibits no discharge. Left eye exhibits no discharge. No scleral  icterus.  Cardiovascular: Normal rate and regular rhythm.  Respiratory: Effort normal. No respiratory distress.  Musculoskeletal:     Cervical back: Normal range of motion.     Comments: RLE No traumatic wounds, ecchymosis, or rash  Mod TTP, compartments soft  No ankle effusion, mod knee effusion  Knee stable to varus/ valgus and anterior/posterior stress  Sens DPN, SPN, TN intact (some paresthesias)  Motor EHL, ext, flex, evers 1/5 (chronic)  DP 2+, PT 2+, No significant edema  Neurological: She is alert.  Skin: Skin is warm and dry. She is not diaphoretic.  Psychiatric: She has a normal mood and affect. Her behavior is normal.    Assessment/Plan: Right tib/fib fx -- Given her non-ambulatory status and good alignment of the fracture will attempt initial non-operative management in a KI and NWB. F/u with Dr. in 2 weeks. Multiple medical problems including s/p CVA with right hemiplegia on coumadin, HTN, hypothyroidism, and GERD    [name], PA-C Orthopedic Surgery [phone] 11/15/2019, 4:09 PM

## 2019-11-15 NOTE — Progress Notes (Signed)
Orthopedic Tech Progress Note Patient Details:  Kathleen Jensen Mar 30, 1951 [id number]  Ortho Devices Type of Ortho Device: Knee Immobilizer, Lenora Boys splint Ortho Device/Splint Location: RLE Ortho Device/Splint Interventions: Ordered, Application   Post Interventions Patient Tolerated: Well Instructions Provided: Care of device   Donald Pore 11/15/2019, 6:48 PM

## 2019-11-15 NOTE — ED Notes (Signed)
Ptar called by Jasmine December patient is third on the list

## 2019-11-15 NOTE — ED Provider Notes (Signed)
68 year old female from [facility] with R leg pain. Pt had a fall several days ago and at the facility was diagnosed with a possible tibial plateau fx. She was sent to [facility] and had imaging there as well which did not show a fx. She has been taking Tylenol without relief of pain over the weekend and staff have had a difficult time caring for her. She is wheelchair bound with residual right sided weakness from a stroke. At shift change xray of the tib/fib and CT of the R knee are pending. Pt give medication here for pain control. Will reassess after imaging.  Xray of the tib/fib shows minimally displaced proximal right tibial and fibular fractures. Pt reports pain over this area. The leg is also diffusely swollen but compartments are soft. 2+ DP pulse palpated. Will discuss with orthopedics.  Discussed with Sanjuana Mae with Ortho - he recommends pt to be non-weight bearing and place in knee immobilizer. A CT of the knee was originally ordered but since pt will likely not be a surgical candidate we can cancel this. 2 week f/u with ortho recommended. Will rx Norco for pain control. Pt discharged.   Bethel Born, PA-C 11/15/19 1657    Little, Ambrose Finland, MD 11/15/19 [phone]

## 2019-11-15 NOTE — ED Notes (Signed)
Patient verbalizes understanding of discharge instructions. Opportunity for questioning and answers were provided. Armband removed by staff, pt discharged from ED via [facility].  

## 2019-11-15 NOTE — ED Notes (Signed)
Pt. Returned from CT.

## 2019-11-15 NOTE — ED Notes (Signed)
Pt. Transported to CT 

## 2019-11-15 NOTE — Discharge Instructions (Addendum)
Give Norco every 4-6 hours as needed for severe pain Do not put any weight on the right leg until evaluated by orthopedics Follow up with orthopedics in 2 weeks for consultation regarding leg fracture

## 2019-12-29 MED ORDER — VENLAFAXINE HCL ER 150 MG PO CP24
150.00 | ORAL_CAPSULE | ORAL | Status: DC
Start: 2019-12-30 — End: 2019-12-29

## 2019-12-29 MED ORDER — ALBUTEROL SULFATE (2.5 MG/3ML) 0.083% IN NEBU
2.50 | INHALATION_SOLUTION | RESPIRATORY_TRACT | Status: DC
Start: ? — End: 2019-12-29

## 2019-12-29 MED ORDER — ACETAMINOPHEN 325 MG PO TABS
650.00 | ORAL_TABLET | ORAL | Status: DC
Start: ? — End: 2019-12-29

## 2019-12-29 MED ORDER — PHENYTOIN SODIUM EXTENDED 100 MG PO CAPS
100.00 | ORAL_CAPSULE | ORAL | Status: DC
Start: 2019-12-29 — End: 2019-12-29

## 2019-12-29 MED ORDER — LABETALOL HCL 5 MG/ML IV SOLN
10.00 | INTRAVENOUS | Status: DC
Start: ? — End: 2019-12-29

## 2019-12-29 MED ORDER — LEVOTHYROXINE SODIUM 88 MCG PO TABS
88.00 | ORAL_TABLET | ORAL | Status: DC
Start: 2019-12-30 — End: 2019-12-29

## 2019-12-29 MED ORDER — HYDROCHLOROTHIAZIDE 12.5 MG PO CAPS
12.50 | ORAL_CAPSULE | ORAL | Status: DC
Start: 2019-12-30 — End: 2019-12-29

## 2019-12-29 MED ORDER — GENERIC EXTERNAL MEDICATION
Status: DC
Start: ? — End: 2019-12-29

## 2019-12-29 MED ORDER — ROSUVASTATIN CALCIUM 5 MG PO TABS
10.00 | ORAL_TABLET | ORAL | Status: DC
Start: 2019-12-29 — End: 2019-12-29

## 2019-12-29 MED ORDER — GENERIC EXTERNAL MEDICATION
5.00 | Status: DC
Start: 2019-12-29 — End: 2019-12-29

## 2019-12-29 MED ORDER — POTASSIUM CHLORIDE 20 MEQ PO PACK
40.00 | PACK | ORAL | Status: DC
Start: 2019-12-29 — End: 2019-12-29

## 2019-12-29 MED ORDER — LOSARTAN POTASSIUM 50 MG PO TABS
50.00 | ORAL_TABLET | ORAL | Status: DC
Start: 2019-12-30 — End: 2019-12-29

## 2019-12-29 MED ORDER — GABAPENTIN 300 MG PO CAPS
300.00 | ORAL_CAPSULE | ORAL | Status: DC
Start: 2019-12-29 — End: 2019-12-29

## 2019-12-29 MED ORDER — ONDANSETRON 4 MG PO TBDP
4.00 | ORAL_TABLET | ORAL | Status: DC
Start: ? — End: 2019-12-29

## 2019-12-29 MED ORDER — LEVETIRACETAM 500 MG PO TABS
2000.00 | ORAL_TABLET | ORAL | Status: DC
Start: 2019-12-29 — End: 2019-12-29

## 2019-12-29 MED ORDER — GABAPENTIN 400 MG PO CAPS
400.00 | ORAL_CAPSULE | ORAL | Status: DC
Start: 2019-12-29 — End: 2019-12-29

## 2020-04-11 ENCOUNTER — Inpatient Hospital Stay (HOSPITAL_COMMUNITY)
Admission: EM | Admit: 2020-04-11 | Discharge: 2020-04-14 | DRG: 689 | Disposition: A | Discharge status: Other Acute Inpatient Hospital | Payer: Medicare HMO | Source: Skilled Nursing Facility | Attending: Internal Medicine | Admitting: Internal Medicine

## 2020-04-11 ENCOUNTER — Emergency Department (HOSPITAL_COMMUNITY): Payer: Medicare HMO

## 2020-04-11 DIAGNOSIS — I69320 Aphasia following cerebral infarction: Secondary | ICD-10-CM

## 2020-04-11 DIAGNOSIS — Z888 Allergy status to other drugs, medicaments and biological substances status: Secondary | ICD-10-CM

## 2020-04-11 DIAGNOSIS — G9340 Encephalopathy, unspecified: Secondary | ICD-10-CM | POA: Diagnosis present

## 2020-04-11 DIAGNOSIS — K219 Gastro-esophageal reflux disease without esophagitis: Secondary | ICD-10-CM | POA: Diagnosis present

## 2020-04-11 DIAGNOSIS — E1151 Type 2 diabetes mellitus with diabetic peripheral angiopathy without gangrene: Secondary | ICD-10-CM | POA: Diagnosis present

## 2020-04-11 DIAGNOSIS — N390 Urinary tract infection, site not specified: Principal | ICD-10-CM | POA: Diagnosis present

## 2020-04-11 DIAGNOSIS — E785 Hyperlipidemia, unspecified: Secondary | ICD-10-CM | POA: Diagnosis present

## 2020-04-11 DIAGNOSIS — Z823 Family history of stroke: Secondary | ICD-10-CM

## 2020-04-11 DIAGNOSIS — G40909 Epilepsy, unspecified, not intractable, without status epilepticus: Secondary | ICD-10-CM | POA: Diagnosis present

## 2020-04-11 DIAGNOSIS — Z950 Presence of cardiac pacemaker: Secondary | ICD-10-CM

## 2020-04-11 DIAGNOSIS — Z882 Allergy status to sulfonamides status: Secondary | ICD-10-CM

## 2020-04-11 DIAGNOSIS — R569 Unspecified convulsions: Secondary | ICD-10-CM | POA: Diagnosis not present

## 2020-04-11 DIAGNOSIS — Z800 Family history of malignant neoplasm of digestive organs: Secondary | ICD-10-CM

## 2020-04-11 DIAGNOSIS — I69351 Hemiplegia and hemiparesis following cerebral infarction affecting right dominant side: Secondary | ICD-10-CM

## 2020-04-11 DIAGNOSIS — E039 Hypothyroidism, unspecified: Secondary | ICD-10-CM | POA: Diagnosis present

## 2020-04-11 DIAGNOSIS — G9341 Metabolic encephalopathy: Secondary | ICD-10-CM | POA: Diagnosis present

## 2020-04-11 DIAGNOSIS — G8191 Hemiplegia, unspecified affecting right dominant side: Secondary | ICD-10-CM

## 2020-04-11 DIAGNOSIS — Z87891 Personal history of nicotine dependence: Secondary | ICD-10-CM

## 2020-04-11 DIAGNOSIS — Z1389 Encounter for screening for other disorder: Secondary | ICD-10-CM

## 2020-04-11 DIAGNOSIS — IMO0002 Reserved for concepts with insufficient information to code with codable children: Secondary | ICD-10-CM | POA: Diagnosis present

## 2020-04-11 DIAGNOSIS — B9620 Unspecified Escherichia coli [E. coli] as the cause of diseases classified elsewhere: Secondary | ICD-10-CM | POA: Diagnosis present

## 2020-04-11 DIAGNOSIS — Z6832 Body mass index (BMI) 32.0-32.9, adult: Secondary | ICD-10-CM

## 2020-04-11 DIAGNOSIS — E669 Obesity, unspecified: Secondary | ICD-10-CM | POA: Diagnosis present

## 2020-04-11 DIAGNOSIS — Z79899 Other long term (current) drug therapy: Secondary | ICD-10-CM

## 2020-04-11 DIAGNOSIS — Z9884 Bariatric surgery status: Secondary | ICD-10-CM

## 2020-04-11 DIAGNOSIS — I10 Essential (primary) hypertension: Secondary | ICD-10-CM | POA: Diagnosis present

## 2020-04-11 DIAGNOSIS — Z885 Allergy status to narcotic agent status: Secondary | ICD-10-CM

## 2020-04-11 DIAGNOSIS — Z833 Family history of diabetes mellitus: Secondary | ICD-10-CM

## 2020-04-11 DIAGNOSIS — Z20822 Contact with and (suspected) exposure to covid-19: Secondary | ICD-10-CM | POA: Diagnosis present

## 2020-04-11 DIAGNOSIS — T420X5A Adverse effect of hydantoin derivatives, initial encounter: Secondary | ICD-10-CM | POA: Diagnosis present

## 2020-04-11 DIAGNOSIS — Z79890 Hormone replacement therapy (postmenopausal): Secondary | ICD-10-CM

## 2020-04-11 DIAGNOSIS — R4701 Aphasia: Secondary | ICD-10-CM | POA: Diagnosis present

## 2020-04-11 LAB — COMPREHENSIVE METABOLIC PANEL
ALT: 28 U/L (ref 0–44)
AST: 48 U/L — ABNORMAL HIGH (ref 15–41)
Albumin: 3.4 g/dL — ABNORMAL LOW (ref 3.5–5.0)
Alkaline Phosphatase: 98 U/L (ref 38–126)
Anion gap: 9 (ref 5–15)
BUN: 16 mg/dL (ref 8–23)
CO2: 25 mmol/L (ref 22–32)
Calcium: 8.2 mg/dL — ABNORMAL LOW (ref 8.9–10.3)
Chloride: 97 mmol/L — ABNORMAL LOW (ref 98–111)
Creatinine, Ser: 0.75 mg/dL (ref 0.44–1.00)
GFR calc Af Amer: >60 mL/min (ref >60)
GFR calc non Af Amer: >60 mL/min (ref >60)
Glucose, Bld: 118 mg/dL — ABNORMAL HIGH (ref 70–99)
Potassium: 4.3 mmol/L (ref 3.5–5.1)
Sodium: 131 mmol/L — ABNORMAL LOW (ref 135–145)
Total Bilirubin: 0.5 mg/dL (ref 0.3–1.2)
Total Protein: 7.4 g/dL (ref 6.5–8.1)

## 2020-04-11 LAB — URINALYSIS, ROUTINE W REFLEX MICROSCOPIC
Bilirubin Urine: NEGATIVE
Glucose, UA: NEGATIVE mg/dL
Hgb urine dipstick: NEGATIVE
Ketones, ur: NEGATIVE mg/dL
Nitrite: POSITIVE — AB
Protein, ur: NEGATIVE mg/dL
Specific Gravity, Urine: 1.014 (ref 1.005–1.030)
WBC, UA: >50 WBC/hpf — ABNORMAL HIGH (ref 0–5)
pH: 5 (ref 5.0–8.0)

## 2020-04-11 LAB — CBC WITH DIFFERENTIAL/PLATELET
Abs Immature Granulocytes: 0.02 10*3/uL (ref 0.00–0.07)
Basophils Absolute: 0 10*3/uL (ref 0.0–0.1)
Basophils Relative: 0 %
Eosinophils Absolute: 0.1 10*3/uL (ref 0.0–0.5)
Eosinophils Relative: 2 %
HCT: 37.6 % (ref 36.0–46.0)
Hemoglobin: 11.8 g/dL — ABNORMAL LOW (ref 12.0–15.0)
Immature Granulocytes: 0 %
Lymphocytes Relative: 26 %
Lymphs Abs: 1.9 10*3/uL (ref 0.7–4.0)
MCH: 27.5 pg (ref 26.0–34.0)
MCHC: 31.4 g/dL (ref 30.0–36.0)
MCV: 87.6 fL (ref 80.0–100.0)
Monocytes Absolute: 0.5 10*3/uL (ref 0.1–1.0)
Monocytes Relative: 7 %
Neutro Abs: 4.8 10*3/uL (ref 1.7–7.7)
Neutrophils Relative %: 65 %
Platelets: 214 10*3/uL (ref 150–400)
RBC: 4.29 MIL/uL (ref 3.87–5.11)
RDW: 13.6 % (ref 11.5–15.5)
WBC: 7.5 10*3/uL (ref 4.0–10.5)
nRBC: 0 % (ref 0.0–0.2)

## 2020-04-11 LAB — PROTIME-INR
INR: 1.1 (ref 0.8–1.2)
Prothrombin Time: 13.3 seconds (ref 11.4–15.2)

## 2020-04-11 LAB — TROPONIN I (HIGH SENSITIVITY): Troponin I (High Sensitivity): 8 ng/L (ref <18)

## 2020-04-11 LAB — PHENYTOIN LEVEL, TOTAL: Phenytoin Lvl: 24.2 ug/mL — ABNORMAL HIGH (ref 10.0–20.0)

## 2020-04-11 MED ORDER — ONDANSETRON HCL 4 MG/2ML IJ SOLN
4.0000 mg | Freq: Once | INTRAMUSCULAR | Status: AC
  Administered 2020-04-11: 4 mg via INTRAVENOUS
  Filled 2020-04-11: qty 2

## 2020-04-11 MED ORDER — SODIUM CHLORIDE 0.9 % IV BOLUS
500.0000 mL | Freq: Once | INTRAVENOUS | Status: AC
  Administered 2020-04-11: 500 mL via INTRAVENOUS

## 2020-04-11 NOTE — ED Triage Notes (Addendum)
Assume care from EMS cc: nausea and lethargy , per EMS pt lives at [place] nursing facility where pt was outside with family and came back inside "acting weird" reported by family. EMS reports pt been lethargic however answers questions . Also, reports right side weakness due to hx of stroke and baseline right leg swelling

## 2020-04-11 NOTE — ED Provider Notes (Signed)
[facility] EMERGENCY DEPARTMENT Provider Note   CSN: [id number] Arrival date & time: 04/11/20  2025     History Chief Complaint  Patient presents with  . Nausea  . Fatigue    Kayonna Melyssa Signor is a 69 y.o. female.  Patient is a 69 year old female with past medical history of prior CVA, seizures, diabetes, hypertension, hyperlipidemia.  She is brought today from her extended care facility for altered mental status.  According to family members, she started "acting weird".  Patient describes nausea and "feeling sick".  She denies to me she is experiencing headache abdominal pain, fevers, or chills.  The history is provided by the patient.       Past Medical History:  Diagnosis Date  . Aphasia   . Arthritis   . Asthma   . Cardiac pacemaker   . Cerebral amyloid angiopathy (CODE)   . CKD (chronic kidney disease)   . Cognitive communication deficit   . 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 GERD (gastroesophageal reflux disease)   . 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 Hyperlipemia   . Hypertension   . Insomnia   . Intracerebral hemorrhage, intraventricular (HCC)   . Muscle weakness   . Other abnormalities of gait and mobility   . Seizures (HCC)   . Stroke (HCC)   . Unsteadiness on feet   . Vitamin B deficiency     Patient Active Problem List   Diagnosis Date Noted  . 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 Hyperlipidemia associated with type 2 diabetes mellitus (HCC) 07/30/2018  . History of cerebral infarction 07/30/2018  . Vitamin B 12 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/30/2018  . Asthma 03/13/2018  . Seizures (HCC) 01/12/2018  . CVA (cerebral vascular accident) (HCC) 12/08/2017  . Abnormal thyroid blood test 12/08/2017  . Pulmonary embolism and infarction (HCC) 12/08/2017  . Anemia 12/08/2017   . Expressive aphasia 10/15/2017  . Seizure disorder (HCC) 10/15/2017  . Dysphagia 10/15/2017  . Diabetes mellitus without complication (HCC)   . Hyperlipidemia 09/22/2017  . Urinary frequency 09/22/2017  . Pre-operative clearance 11/19/2016  . Airway hyperreactivity 01/26/2016  . Disease of thyroid gland 01/26/2016  . 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 Other specified postprocedural states 01/26/2016  . 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 Chronic LBP 04/10/2015  . Degeneration of intervertebral disc of lumbar region 04/10/2015  . Spondylolisthesis of lumbar region 04/10/2015  . 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  . History of cardiac pacemaker in situ 01/09/2015  . B12 DEFICIENCY 08/15/2008  . UNSPECIFIED VITAMIN D DEFICIENCY 08/15/2008  . SINUS BRADYCARDIA 08/15/2008  . OBSTRUCTIVE SLEEP APNEA 08/11/2008  . INSOMNIA 04/28/2008  . Hyperlipidemia LDL goal <70 12/12/2006  . RHINITIS, ALLERGIC NEC 12/12/2006  . Hypothyroidism 12/11/2006  . Essential hypertension 12/11/2006  . GERD 12/11/2006  . STRESS INCONTINENCE 12/11/2006    Past Surgical History:  Procedure Laterality Date  . BACK SURGERY     Dec 18, 2016 Dr.Torrealba  . BREAST BIOPSY Right   . BREAST EXCISIONAL BIOPSY    . CERVICAL SPINE SURGERY     C4  . CESAREAN SECTION    . GASTRIC BYPASS  03/2007  . TONSILLECTOMY       OB History   No obstetric history on file.     Family History  Problem Relation Age of Onset  . High blood pressure Mother   .  Diabetes Mother   . Diabetes Father   . Stroke Father   . Cancer Sister        Colorectal cancer  . Cancer Brother        colon    Social History   Tobacco Use  . Smoking status: Former Smoker    Years: 4.00    Quit date: 07/30/1979    Years since quitting: 40.7  . Smokeless tobacco: Never Used  Vaping Use  . Vaping Use: Never used  Substance Use Topics  . Alcohol use: No    Alcohol/week: 0.0 standard drinks  . Drug use: No    Home  Medications Prior to Admission medications   Medication Sig Start Date End Date Taking? Authorizing Provider  acetaminophen (TYLENOL) 500 MG tablet Take 1,000 mg by mouth 2 (two) times daily as needed.    [provider]  albuterol (PROVENTIL HFA;VENTOLIN HFA) 108 (90 Base) MCG/ACT inhaler Inhale 2 puffs into the lungs every 6 (six) hours as needed for wheezing or shortness of breath. Patient not taking: Reported on 11/15/2019 03/13/18   Zola Button, Grayling Congress, DO  B Complex-C-Folic Acid TABS Take 1 tablet by mouth daily.    [provider]  docusate sodium (COLACE) 100 MG capsule Take 100 mg by mouth 2 (two) times daily.    [provider]  gabapentin (NEURONTIN) 300 MG capsule Take 1 capsule by mouth in the morning and at bedtime.  01/21/18   [provider]  gabapentin (NEURONTIN) 400 MG capsule Take 400 mg by mouth at bedtime.    [provider]  hydrochlorothiazide (MICROZIDE) 12.5 MG capsule TAKE 1 CAPSULE (12.5 MG TOTAL) BY MOUTH DAILY. 06/01/18   Donato Schultz, DO  HYDROcodone-acetaminophen (NORCO/VICODIN) 5-325 MG tablet Take 1 tablet by mouth every 4 (four) hours as needed. 11/15/19   Bethel Born, PA-C  levETIRAcetam (KEPPRA) 1000 MG tablet 2 po bid Patient taking differently: Take 1,000 mg by mouth 2 (two) times daily.  12/11/17   Donato Schultz, DO  levothyroxine (SYNTHROID, LEVOTHROID) 88 MCG tablet Take 1 tablet (88 mcg total) by mouth daily before breakfast. 01/12/18   Zola Button, Grayling Congress, DO  losartan (COZAAR) 100 MG tablet Take 1 tablet (100 mg total) by mouth daily. Patient not taking: Reported on 11/15/2019 04/21/18   Zola Button, Grayling Congress, DO  losartan-hydrochlorothiazide (HYZAAR) 50-12.5 MG tablet Take 1 tablet by mouth daily.    [provider]  magnesium oxide (MAG-OX) 400 MG tablet Take 400 mg by mouth 2 (two) times daily.    [provider]  meclizine (ANTIVERT) 12.5 MG tablet Take 12.5 mg by  mouth 3 (three) times daily as needed for dizziness.    [provider]  mirtazapine (REMERON) 30 MG tablet Take 1 tablet (30 mg total) by mouth at bedtime. Patient not taking: Reported on 11/15/2019 04/22/18   Donato Schultz, DO  Misc. Devices (TRANSFER BOARD) MISC 1 each by Does not apply route as needed (For Bed Transfer). Patient not taking: Reported on 11/15/2019 04/23/18   Zola Button, Grayling Congress, DO  pantoprazole (PROTONIX) 40 MG tablet Take 1 tablet (40 mg total) by mouth daily. Patient not taking: Reported on 11/15/2019 01/12/18   Zola Button, Grayling Congress, DO  phenytoin (DILANTIN) 100 MG ER capsule Take 100 mg by mouth 3 (three) times daily.    [provider]  polycarbophil (FIBERCON) 625 MG tablet Take 625 mg by mouth daily. With 8  oz of water.    [provider]  polyethylene glycol (MIRALAX / GLYCOLAX) packet Take 17 g by mouth in the morning and at bedtime. Mix with 6 - 8 oz liquid.    [provider]  senna-docusate (SENOKOT-S) 8.6-50 MG tablet Take 1 tablet by mouth 2 (two) times daily.    [provider]  simvastatin (ZOCOR) 10 MG tablet Take 10 mg by mouth daily.    [provider]  tiZANidine (ZANAFLEX) 4 MG tablet Take 1 tablet (4 mg total) by mouth every 6 (six) hours as needed for muscle spasms. Patient not taking: Reported on 11/15/2019 03/03/18   Zola ButtonLowne Chase, Grayling CongressYvonne R, DO  valACYclovir (VALTREX) 1000 MG tablet Take 1 tablet (1,000 mg total) by mouth 3 (three) times daily. Patient not taking: Reported on 11/15/2019 03/03/18   Zola ButtonLowne Chase, Grayling CongressYvonne R, DO  venlafaxine XR (EFFEXOR-XR) 75 MG 24 hr capsule Take 150 mg by mouth daily.    [provider]  warfarin (COUMADIN) 5 MG tablet 10 mg on mon and 5 mg all other days Patient not taking: Reported on 11/15/2019 12/08/17   Donato SchultzLowne Chase, Yvonne R, DO    Allergies    Tape, Meperidine hcl, Sulfonamide derivatives, Bacitracin-polymyxin b, and Oxycodone  Review of Systems   Review  of Systems  All other systems reviewed and are negative.   Physical Exam Updated Vital Signs BP (!) 159/88 (BP Location: Right Arm)   Pulse 62   Temp 98.1 F (36.7 C) (Oral)   Resp 15   Ht 5\' 4"  (1.626 m)   Wt 85.5 kg   SpO2 100%   BMI 32.37 kg/m   Physical Exam Vitals and nursing note reviewed.  Constitutional:      General: She is not in acute distress.    Appearance: She is well-developed. She is not diaphoretic.  HENT:     Head: Normocephalic and atraumatic.  Eyes:     Extraocular Movements: Extraocular movements intact.     Pupils: Pupils are equal, round, and reactive to light.  Cardiovascular:     Rate and Rhythm: Normal rate and regular rhythm.     Heart sounds: No murmur heard.  No friction rub. No gallop.   Pulmonary:     Effort: Pulmonary effort is normal. No respiratory distress.     Breath sounds: Normal breath sounds. No wheezing.  Abdominal:     General: Bowel sounds are normal. There is no distension.     Palpations: Abdomen is soft.     Tenderness: There is no abdominal tenderness.  Musculoskeletal:        General: Normal range of motion.     Cervical back: Normal range of motion and neck supple.     Right lower leg: Edema present.  Skin:    General: Skin is warm and dry.  Neurological:     Mental Status: She is alert.     Comments: Patient is awake and alert.  She does appear somewhat confused.  She is able to follow commands.  She appears to have diminished strength to the right arm and right leg, however this appears to be her baseline.     ED Results / Procedures / Treatments   Labs (all labs ordered are listed, but only abnormal results are displayed) Labs Reviewed  COMPREHENSIVE METABOLIC PANEL  CBC WITH DIFFERENTIAL/PLATELET  URINALYSIS, ROUTINE W REFLEX MICROSCOPIC  PROTIME-INR  PHENYTOIN LEVEL, TOTAL  TROPONIN I (HIGH SENSITIVITY)    EKG EKG Interpretation  Date/Time:  Tuesday April 11 2020 20:33:04 EDT Ventricular Rate:   63 PR Interval:    QRS Duration: 90 QT Interval:  441 QTC Calculation: 452 R Axis:   -29 Text Interpretation: Junctional rhythm LVH with secondary repolarization abnormality Anterior Q waves, possibly due to LVH Confirmed by Geoffery Lyons ([id number]) on 04/11/2020 8:49:24 PM   Radiology No results found.  Procedures Procedures (including critical care time)  Medications Ordered in ED Medications  sodium chloride 0.9 % bolus 500 mL (has no administration in time range)  ondansetron (ZOFRAN) injection 4 mg (has no administration in time range)    ED Course  I have reviewed the triage vital signs and the nursing notes.  Pertinent labs & imaging results that were available during my care of the patient were reviewed by me and considered in my medical decision making (see chart for details).    MDM Rules/Calculators/A&P  Patient is a 69 year old female brought from her extended care facility for evaluation of "acting weird".  This was reported by family members.  Patient arrived here complaining only of feeling generally unwell and nausea.  There were no obvious deficits on her neurologic exam.  Patient's work-up thus far shows no significant electrolyte abnormality.  Her Dilantin level was elevated at 24.2.  She did undergo a CT scan of the head which was negative for acute stroke.  Patient care discussed with Dr. Laurence Slate from neurology who is recommending holding her Dilantin for the next day and MRI to rule out the possibility of new stroke.  This test will be obtained.  Care signed out to the oncoming provider who will obtain the results and use this to determine the final disposition.  Final Clinical Impression(s) / ED Diagnoses Final diagnoses:  None    Rx / DC Orders ED Discharge Orders    None       Geoffery Lyons, MD 04/11/20 2346

## 2020-04-11 NOTE — ED Notes (Signed)
ED Provider at bedside. 

## 2020-04-12 ENCOUNTER — Observation Stay (HOSPITAL_COMMUNITY): Payer: Medicare HMO

## 2020-04-12 ENCOUNTER — Emergency Department (HOSPITAL_COMMUNITY): Payer: Medicare HMO

## 2020-04-12 ENCOUNTER — Other Ambulatory Visit: Payer: Self-pay

## 2020-04-12 DIAGNOSIS — Z1389 Encounter for screening for other disorder: Secondary | ICD-10-CM

## 2020-04-12 DIAGNOSIS — Z823 Family history of stroke: Secondary | ICD-10-CM | POA: Diagnosis not present

## 2020-04-12 DIAGNOSIS — R4701 Aphasia: Secondary | ICD-10-CM | POA: Diagnosis not present

## 2020-04-12 DIAGNOSIS — N390 Urinary tract infection, site not specified: Secondary | ICD-10-CM | POA: Diagnosis present

## 2020-04-12 DIAGNOSIS — I69320 Aphasia following cerebral infarction: Secondary | ICD-10-CM | POA: Diagnosis not present

## 2020-04-12 DIAGNOSIS — Z79899 Other long term (current) drug therapy: Secondary | ICD-10-CM | POA: Diagnosis not present

## 2020-04-12 DIAGNOSIS — Z800 Family history of malignant neoplasm of digestive organs: Secondary | ICD-10-CM | POA: Diagnosis not present

## 2020-04-12 DIAGNOSIS — I69351 Hemiplegia and hemiparesis following cerebral infarction affecting right dominant side: Secondary | ICD-10-CM | POA: Diagnosis not present

## 2020-04-12 DIAGNOSIS — E1165 Type 2 diabetes mellitus with hyperglycemia: Secondary | ICD-10-CM

## 2020-04-12 DIAGNOSIS — E1139 Type 2 diabetes mellitus with other diabetic ophthalmic complication: Secondary | ICD-10-CM

## 2020-04-12 DIAGNOSIS — I10 Essential (primary) hypertension: Secondary | ICD-10-CM

## 2020-04-12 DIAGNOSIS — E039 Hypothyroidism, unspecified: Secondary | ICD-10-CM | POA: Diagnosis present

## 2020-04-12 DIAGNOSIS — K219 Gastro-esophageal reflux disease without esophagitis: Secondary | ICD-10-CM | POA: Diagnosis present

## 2020-04-12 DIAGNOSIS — G40909 Epilepsy, unspecified, not intractable, without status epilepticus: Secondary | ICD-10-CM | POA: Diagnosis present

## 2020-04-12 DIAGNOSIS — E669 Obesity, unspecified: Secondary | ICD-10-CM | POA: Diagnosis present

## 2020-04-12 DIAGNOSIS — G8191 Hemiplegia, unspecified affecting right dominant side: Secondary | ICD-10-CM

## 2020-04-12 DIAGNOSIS — Z885 Allergy status to narcotic agent status: Secondary | ICD-10-CM | POA: Diagnosis not present

## 2020-04-12 DIAGNOSIS — G9340 Encephalopathy, unspecified: Secondary | ICD-10-CM | POA: Diagnosis not present

## 2020-04-12 DIAGNOSIS — E785 Hyperlipidemia, unspecified: Secondary | ICD-10-CM | POA: Diagnosis present

## 2020-04-12 DIAGNOSIS — B9620 Unspecified Escherichia coli [E. coli] as the cause of diseases classified elsewhere: Secondary | ICD-10-CM | POA: Diagnosis present

## 2020-04-12 DIAGNOSIS — T420X5A Adverse effect of hydantoin derivatives, initial encounter: Secondary | ICD-10-CM | POA: Diagnosis present

## 2020-04-12 DIAGNOSIS — G9341 Metabolic encephalopathy: Secondary | ICD-10-CM | POA: Diagnosis present

## 2020-04-12 DIAGNOSIS — Z882 Allergy status to sulfonamides status: Secondary | ICD-10-CM | POA: Diagnosis not present

## 2020-04-12 DIAGNOSIS — Z6832 Body mass index (BMI) 32.0-32.9, adult: Secondary | ICD-10-CM | POA: Diagnosis not present

## 2020-04-12 DIAGNOSIS — R569 Unspecified convulsions: Secondary | ICD-10-CM | POA: Diagnosis present

## 2020-04-12 DIAGNOSIS — Z888 Allergy status to other drugs, medicaments and biological substances status: Secondary | ICD-10-CM | POA: Diagnosis not present

## 2020-04-12 DIAGNOSIS — E1151 Type 2 diabetes mellitus with diabetic peripheral angiopathy without gangrene: Secondary | ICD-10-CM | POA: Diagnosis present

## 2020-04-12 DIAGNOSIS — Z833 Family history of diabetes mellitus: Secondary | ICD-10-CM | POA: Diagnosis not present

## 2020-04-12 DIAGNOSIS — Z87891 Personal history of nicotine dependence: Secondary | ICD-10-CM | POA: Diagnosis not present

## 2020-04-12 DIAGNOSIS — Z79890 Hormone replacement therapy (postmenopausal): Secondary | ICD-10-CM | POA: Diagnosis not present

## 2020-04-12 DIAGNOSIS — Z20822 Contact with and (suspected) exposure to covid-19: Secondary | ICD-10-CM | POA: Diagnosis present

## 2020-04-12 LAB — CBC WITH DIFFERENTIAL/PLATELET
Abs Immature Granulocytes: 0.01 10*3/uL (ref 0.00–0.07)
Basophils Absolute: 0.1 10*3/uL (ref 0.0–0.1)
Basophils Relative: 1 %
Eosinophils Absolute: 0.3 10*3/uL (ref 0.0–0.5)
Eosinophils Relative: 4 %
HCT: 37.3 % (ref 36.0–46.0)
Hemoglobin: 11.5 g/dL — ABNORMAL LOW (ref 12.0–15.0)
Immature Granulocytes: 0 %
Lymphocytes Relative: 36 %
Lymphs Abs: 2.6 10*3/uL (ref 0.7–4.0)
MCH: 28.3 pg (ref 26.0–34.0)
MCHC: 30.8 g/dL (ref 30.0–36.0)
MCV: 91.6 fL (ref 80.0–100.0)
Monocytes Absolute: 0.5 10*3/uL (ref 0.1–1.0)
Monocytes Relative: 7 %
Neutro Abs: 3.7 10*3/uL (ref 1.7–7.7)
Neutrophils Relative %: 52 %
Platelets: 205 10*3/uL (ref 150–400)
RBC: 4.07 MIL/uL (ref 3.87–5.11)
RDW: 13.6 % (ref 11.5–15.5)
WBC: 7.1 10*3/uL (ref 4.0–10.5)
nRBC: 0 % (ref 0.0–0.2)

## 2020-04-12 LAB — CARBAMAZEPINE LEVEL, TOTAL: Carbamazepine Lvl: 2 ug/mL — ABNORMAL LOW (ref 4.0–12.0)

## 2020-04-12 LAB — BASIC METABOLIC PANEL
Anion gap: 10 (ref 5–15)
BUN: 11 mg/dL (ref 8–23)
CO2: 25 mmol/L (ref 22–32)
Calcium: 8.6 mg/dL — ABNORMAL LOW (ref 8.9–10.3)
Chloride: 99 mmol/L (ref 98–111)
Creatinine, Ser: 0.68 mg/dL (ref 0.44–1.00)
GFR calc Af Amer: >60 mL/min (ref >60)
GFR calc non Af Amer: >60 mL/min (ref >60)
Glucose, Bld: 85 mg/dL (ref 70–99)
Potassium: 3.7 mmol/L (ref 3.5–5.1)
Sodium: 134 mmol/L — ABNORMAL LOW (ref 135–145)

## 2020-04-12 LAB — HIV ANTIBODY (ROUTINE TESTING W REFLEX): HIV Screen 4th Generation wRfx: NONREACTIVE

## 2020-04-12 LAB — TSH: TSH: 0.597 u[IU]/mL (ref 0.350–4.500)

## 2020-04-12 LAB — AMMONIA: Ammonia: 20 umol/L (ref 9–35)

## 2020-04-12 LAB — SARS CORONAVIRUS 2 BY RT PCR (HOSPITAL ORDER, PERFORMED IN ~~LOC~~ HOSPITAL LAB): SARS Coronavirus 2: NEGATIVE

## 2020-04-12 LAB — TROPONIN I (HIGH SENSITIVITY): Troponin I (High Sensitivity): 8 ng/L (ref <18)

## 2020-04-12 MED ORDER — HYDROCHLOROTHIAZIDE 12.5 MG PO CAPS
12.5000 mg | ORAL_CAPSULE | Freq: Every day | ORAL | Status: DC
  Administered 2020-04-12 – 2020-04-14 (×3): 12.5 mg via ORAL
  Filled 2020-04-12 (×3): qty 1

## 2020-04-12 MED ORDER — BACLOFEN 10 MG PO TABS
5.0000 mg | ORAL_TABLET | Freq: Two times a day (BID) | ORAL | Status: DC
  Administered 2020-04-12 – 2020-04-14 (×6): 5 mg via ORAL
  Filled 2020-04-12: qty 1
  Filled 2020-04-12: qty 0.5
  Filled 2020-04-12: qty 1
  Filled 2020-04-12: qty 0.5
  Filled 2020-04-12 (×2): qty 1
  Filled 2020-04-12: qty 0.5

## 2020-04-12 MED ORDER — CARBAMAZEPINE 200 MG PO TABS
200.0000 mg | ORAL_TABLET | Freq: Two times a day (BID) | ORAL | Status: DC
  Administered 2020-04-12 – 2020-04-14 (×5): 200 mg via ORAL
  Filled 2020-04-12 (×8): qty 1

## 2020-04-12 MED ORDER — SIMVASTATIN 20 MG PO TABS
10.0000 mg | ORAL_TABLET | Freq: Every day | ORAL | Status: DC
  Administered 2020-04-12 – 2020-04-14 (×3): 10 mg via ORAL
  Filled 2020-04-12 (×3): qty 1

## 2020-04-12 MED ORDER — VENLAFAXINE HCL ER 75 MG PO CP24
150.0000 mg | ORAL_CAPSULE | Freq: Every day | ORAL | Status: DC
  Administered 2020-04-12 – 2020-04-14 (×3): 150 mg via ORAL
  Filled 2020-04-12: qty 1
  Filled 2020-04-12: qty 2
  Filled 2020-04-12: qty 1
  Filled 2020-04-12: qty 2

## 2020-04-12 MED ORDER — ACETAMINOPHEN 325 MG PO TABS: 650.0000 mg | ORAL_TABLET | Freq: Four times a day (QID) | ORAL | Status: DC | PRN

## 2020-04-12 MED ORDER — LOSARTAN POTASSIUM-HCTZ 50-12.5 MG PO TABS: 1.0000 | ORAL_TABLET | Freq: Every day | ORAL | Status: DC

## 2020-04-12 MED ORDER — SODIUM CHLORIDE 0.9 % IV SOLN
1.0000 g | INTRAVENOUS | Status: DC
  Administered 2020-04-13 – 2020-04-14 (×2): 1 g via INTRAVENOUS
  Filled 2020-04-12 (×2): qty 10
  Filled 2020-04-12: qty 1

## 2020-04-12 MED ORDER — LEVETIRACETAM 500 MG PO TABS
2000.0000 mg | ORAL_TABLET | Freq: Two times a day (BID) | ORAL | Status: DC
  Administered 2020-04-12 – 2020-04-14 (×6): 2000 mg via ORAL
  Filled 2020-04-12 (×6): qty 4

## 2020-04-12 MED ORDER — ACETAMINOPHEN 500 MG PO TABS: 1000.0000 mg | ORAL_TABLET | Freq: Two times a day (BID) | ORAL | Status: DC | PRN

## 2020-04-12 MED ORDER — ONDANSETRON HCL 4 MG PO TABS: 4.0000 mg | ORAL_TABLET | Freq: Four times a day (QID) | ORAL | Status: DC | PRN

## 2020-04-12 MED ORDER — LEVOTHYROXINE SODIUM 75 MCG PO TABS
75.0000 ug | ORAL_TABLET | Freq: Every day | ORAL | Status: DC
  Administered 2020-04-12 – 2020-04-14 (×3): 75 ug via ORAL
  Filled 2020-04-12 (×3): qty 1

## 2020-04-12 MED ORDER — ENOXAPARIN SODIUM 40 MG/0.4ML ~~LOC~~ SOLN
40.0000 mg | Freq: Every day | SUBCUTANEOUS | Status: DC
  Administered 2020-04-12 – 2020-04-14 (×3): 40 mg via SUBCUTANEOUS
  Filled 2020-04-12 (×3): qty 0.4

## 2020-04-12 MED ORDER — MAGNESIUM OXIDE 400 (241.3 MG) MG PO TABS
400.0000 mg | ORAL_TABLET | Freq: Two times a day (BID) | ORAL | Status: DC
  Administered 2020-04-12 – 2020-04-14 (×5): 400 mg via ORAL
  Filled 2020-04-12 (×6): qty 1

## 2020-04-12 MED ORDER — SODIUM CHLORIDE 0.9 % IV SOLN
1.0000 g | Freq: Once | INTRAVENOUS | Status: AC
  Administered 2020-04-12: 1 g via INTRAVENOUS
  Filled 2020-04-12: qty 10

## 2020-04-12 MED ORDER — ONDANSETRON HCL 4 MG/2ML IJ SOLN: 4.0000 mg | Freq: Four times a day (QID) | INTRAMUSCULAR | Status: DC | PRN

## 2020-04-12 MED ORDER — ACETAMINOPHEN 650 MG RE SUPP: 650.0000 mg | Freq: Four times a day (QID) | RECTAL | Status: DC | PRN

## 2020-04-12 MED ORDER — MECLIZINE HCL 25 MG PO TABS: 12.5000 mg | ORAL_TABLET | Freq: Three times a day (TID) | ORAL | Status: DC | PRN

## 2020-04-12 MED ORDER — GABAPENTIN 300 MG PO CAPS
300.0000 mg | ORAL_CAPSULE | Freq: Two times a day (BID) | ORAL | Status: DC
  Administered 2020-04-12 – 2020-04-14 (×5): 300 mg via ORAL
  Filled 2020-04-12 (×5): qty 1

## 2020-04-12 MED ORDER — B COMPLEX-C PO TABS
1.0000 | ORAL_TABLET | Freq: Every day | ORAL | Status: DC
  Administered 2020-04-12 – 2020-04-14 (×3): 1 via ORAL
  Filled 2020-04-12 (×3): qty 1

## 2020-04-12 MED ORDER — LOSARTAN POTASSIUM 50 MG PO TABS
50.0000 mg | ORAL_TABLET | Freq: Every day | ORAL | Status: DC
  Administered 2020-04-12 – 2020-04-14 (×3): 50 mg via ORAL
  Filled 2020-04-12 (×3): qty 1

## 2020-04-12 MED ORDER — POLYETHYLENE GLYCOL 3350 17 G PO PACK
17.0000 g | PACK | Freq: Every day | ORAL | Status: DC
  Administered 2020-04-12 – 2020-04-13 (×2): 17 g via ORAL
  Filled 2020-04-12 (×3): qty 1

## 2020-04-12 MED ORDER — CALCIUM POLYCARBOPHIL 625 MG PO TABS
1250.0000 mg | ORAL_TABLET | Freq: Every day | ORAL | Status: DC
  Administered 2020-04-12 – 2020-04-14 (×3): 1250 mg via ORAL
  Filled 2020-04-12 (×3): qty 2

## 2020-04-12 MED ORDER — SENNOSIDES-DOCUSATE SODIUM 8.6-50 MG PO TABS
1.0000 | ORAL_TABLET | Freq: Two times a day (BID) | ORAL | Status: DC
  Administered 2020-04-12 – 2020-04-14 (×4): 1 via ORAL
  Filled 2020-04-12 (×4): qty 1

## 2020-04-12 NOTE — Progress Notes (Signed)
MEDICATION RELATED CONSULT NOTE - INITIAL   Pharmacy Consult for Phenytoin Indication: h/o seizures  Allergies  Allergen Reactions  . Tape Rash  . Meperidine Hcl Nausea And Vomiting  . Sulfonamide Derivatives Itching  . Bacitracin-Polymyxin B Dermatitis    "Cloth Band-Aid only"  . Oxycodone Itching    Patient Measurements: Height: 5\' 4"  (162.6 cm) Weight: 85.5 kg (188 lb 9.6 oz) IBW/kg (Calculated) : 54.7  Vital Signs: Temp: 98.1 F (36.7 C) (09/14 2030) Temp Source: Oral (09/14 2030) BP: 158/88 (09/14 2345) Pulse Rate: 60 (09/14 2345) Intake/Output from previous day: 09/14 0701 - 09/15 0700 In: 500 [IV Piggyback:500] Out: -  Intake/Output from this shift: Total I/O In: 500 [IV Piggyback:500] Out: -   Labs: Recent Labs    04/11/20 2138  WBC 7.5  HGB 11.8*  HCT 37.6  PLT 214  CREATININE 0.75  ALBUMIN 3.4*  PROT 7.4  AST 48*  ALT 28  ALKPHOS 98  BILITOT 0.5   Estimated Creatinine Clearance: 70.2 mL/min (by C-G formula based on SCr of 0.75 mg/dL).   Microbiology: No results found for this or any previous visit (from the past 720 hour(s)).  Medical History: Past Medical History:  Diagnosis Date  . Aphasia   . Arthritis   . Asthma   . Cardiac pacemaker   . Cerebral amyloid angiopathy (CODE)   . CKD (chronic kidney disease)   . Cognitive communication deficit   . 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 GERD (gastroesophageal reflux disease)   . 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 Hyperlipemia   . Hypertension   . Insomnia   . Intracerebral hemorrhage, intraventricular (HCC)   . Muscle weakness   . Other abnormalities of gait and mobility   . Seizures (HCC)   . Stroke (HCC)   . Unsteadiness on feet   . Vitamin B deficiency     Medications:  See electronic med rec  Assessment: 69 y.o. F presents with nausea and fatigue. Pt  on phenytoin 100mg  TID PTA, Keppra 2000mg  BID, and carbamazepine 200mg  BID for h/o seizures. Phenytoin level 24.2 (supratherapeutic)  Goal of Therapy:  Phenytoin level 15-20 mcg/ml; prevention of seizures  Plan:  Hold phenytoin for now Recheck level 9/16 a.m. and consider restart at slightly lower dose  , PharmD, BCPS Please see amion for complete clinical pharmacist phone list 04/12/2020,1:14 AM

## 2020-04-12 NOTE — ED Notes (Signed)
Cleaned pt. Placed new chucks and brief.

## 2020-04-12 NOTE — Progress Notes (Addendum)
[facility]                                                                              Patient Demographics  Kathleen Jensen, is a 69 y.o. female, DOB - 15-Jun-1951, LHT:[id number]  Admit date - 04/11/2020   Admitting Physician Hillary Bow, DO  Outpatient Primary MD for the patient is Patient, No Pcp Per  Outpatient specialists:   LOS - 0  days   Medical records reviewed and are as summarized below:    Chief Complaint  Patient presents with  . Nausea  . Fatigue       Brief summary   Patient is a 69 year old female with history of prior stroke, residual aphasia, right-sided hemiplegia, right-sided hemineglect, history of seizure disorder on multiple seizure medications.  Patient had admission to [facility] at the end of May for breakthrough seizures, followed by [facility] neurology..  Patient presented from [facility] for new onset altered mental status.  Patient was outside with family and came back inside acting differently.  Unable to contribute much to history due to severe aphasia. UA positive for UTI  Assessment & Plan    Principal Problem:   Acute metabolic encephalopathy -Likely delirium due to UTI, breakthrough seizures, new stroke -Neurology consulted.  Active Problems: Acute lower UTI -Follow urine culture and sensitivities, continue IV Rocephin  Seizure disorder -Neurology consulted Dilantin level elevated, hold 1 dose -EEG, continue Keppra, Vimpat at home doses -Seizure precautions -EEG showed mild to moderate diffuse encephalopathy, nonspecific  History of prior stroke with severe aphasia, right hemiplegia, expressive aphasia -No acute neurological deficits, neuro following -Continue BP control, simvastatin    Essential hypertension -Continue   Hypothyroidism -Continue Synthroid  Obesity Estimated body mass index is 32.37 kg/m as calculated from the following:   Height as of this encounter: 5\' 4"  (1.626 m).   Weight  as of this encounter: 85.5 kg.  Code Status: Full code DVT Prophylaxis:  Lovenox  Family Communication: Discussed all imaging results, lab results, explained to the patient, no family member at the bedside   Disposition Plan:     Status is: Observation  The patient will require care spanning > 2 midnights and should be moved to inpatient because: Inpatient level of care appropriate due to severity of illness.  Inpatient status due to acute metabolic encephalopathy secondary to acute UTI.  Dispo: The patient is from: SNF              Anticipated d/c is to: SNF              Anticipated d/c date is: 2 days              Patient currently is not medically stable to d/c.      Time Spent in minutes 25 minutes  Procedures:  EEG  Consultants:   Neurology  Antimicrobials:   Anti-infectives (From admission, onward)   Start     Dose/Rate Route Frequency Ordered Stop   04/12/20 2200  cefTRIAXone (ROCEPHIN) 1 g in sodium chloride 0.9 % 100 mL IVPB        1 g 200  mL/hr over 30 Minutes Intravenous Every 24 hours 04/12/20 0049     04/12/20 0030  cefTRIAXone (ROCEPHIN) 1 g in sodium chloride 0.9 % 100 mL IVPB        1 g 200 mL/hr over 30 Minutes Intravenous  Once 04/12/20 0020 04/12/20 [id number]          Medications  Scheduled Meds: . B-complex with vitamin C  1 tablet Oral Daily  . baclofen  5 mg Oral BID  . carbamazepine  200 mg Oral BID  . enoxaparin (LOVENOX) injection  40 mg Subcutaneous Daily  . gabapentin  300 mg Oral BID  . losartan  50 mg Oral Daily   And  . hydrochlorothiazide  12.5 mg Oral Daily  . levETIRAcetam  2,000 mg Oral BID  . levothyroxine  75 mcg Oral QAC breakfast  . magnesium oxide  400 mg Oral BID  . polycarbophil  1,250 mg Oral Daily  . polyethylene glycol  17 g Oral Daily  . senna-docusate  1 tablet Oral BID  . simvastatin  10 mg Oral Daily  . venlafaxine XR  150 mg Oral Daily   Continuous Infusions: . cefTRIAXone (ROCEPHIN)  IV     PRN  Meds:.acetaminophen **OR** acetaminophen, meclizine, ondansetron **OR** ondansetron (ZOFRAN) IV      Subjective:   Kathleen Jensen was seen and examined today.  Still appears to be somewhat postictal, has right-sided weakness.  No acute events overnight, no repeat seizures.  No active nausea vomiting or diarrhea  Objective:   Vitals:   04/12/20 0930 04/12/20 1000 04/12/20 1030 04/12/20 1100  BP: 119/82 129/75 (!) 132/93 130/74  Pulse: 60     Resp: 19 14 14 11   Temp:      TempSrc:      SpO2: 99%     Weight:      Height:        Intake/Output Summary (Last 24 hours) at 04/12/2020 1250 Last data filed at 04/12/2020 0359 Gross per 24 hour  Intake 600 ml  Output 700 ml  Net -100 ml     Wt Readings from Last 3 Encounters:  04/11/20 85.5 kg  11/15/19 112.5 kg  07/30/18 98.6 kg     Exam  General: Alert and oriented to self and person  Cardiovascular: S1 S2 auscultated, no murmurs, RRR  Respiratory: Decreased breath sound at the bases  Gastrointestinal: Soft, nontender, nondistended, + bowel sounds  Ext: no pedal edema bilaterally  Neuro: right-sided hemiplegia  Musculoskeletal: No digital cyanosis, clubbing  Skin: No rashes  Psych:     Data Reviewed:  I have personally reviewed following labs and imaging studies  Micro Results Recent Results (from the past 240 hour(s))  SARS Coronavirus 2 by RT PCR (hospital order, performed [facility]) Nasopharyngeal Nasopharyngeal Swab     Status: None   Collection Time: 04/11/20 11:55 PM   Specimen: Nasopharyngeal Swab  Result Value Ref Range Status   SARS Coronavirus 2 NEGATIVE NEGATIVE Final    Comment: (NOTE) SARS-CoV-2 target nucleic acids are NOT DETECTED.  The SARS-CoV-2 RNA is generally detectable in upper and lower respiratory specimens during the acute phase of infection. The lowest concentration of SARS-CoV-2 viral copies this assay can detect is 250 copies / mL. A negative result does  not preclude SARS-CoV-2 infection and should not be used as the sole basis for treatment or other patient management decisions.  A negative result may occur with improper specimen collection / handling, submission of specimen  other than nasopharyngeal swab, presence of viral mutation(s) within the areas targeted by this assay, and inadequate number of viral copies (<250 copies / mL). A negative result must be combined with clinical observations, patient history, and epidemiological information.  Fact Sheet for Patients:   BoilerBrush.com.cy  Fact Sheet for Healthcare Providers: https://pope.com/  This test is not yet approved or  cleared by the [place] FDA and has been authorized for detection and/or diagnosis of SARS-CoV-2 by FDA under an Emergency Use Authorization (EUA).  This EUA will remain in effect (meaning this test can be used) for the duration of the COVID-19 declaration under Section 564(b)(1) of the Act, 21 U.S.C. section 360bbb-3(b)(1), unless the authorization is terminated or revoked sooner.  Performed at [facility], [address], [place], [place] [address]     Radiology Reports EEG  Result Date: 04/12/2020 Charlsie Quest, MD     04/12/2020 11:08 AM Patient Name: Kathleen Jensen MRN: [id number] Epilepsy Attending: Charlsie Quest Referring Physician/Provider: Dr Georgiana Spinner Aroor Date: 04/12/2020 Duration: 23.26 mins Patient history: 69 y.o. 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 EEG to evaluate for seizure. Level of alertness: Awake AEDs during EEG study: CBZ, LEV, GBP Technical aspects: This EEG study was done with scalp electrodes positioned according to the 10-20 International system of electrode  placement. Electrical activity was acquired at a sampling rate of 500Hz  and reviewed with a high frequency filter of 70Hz  and a low frequency filter of 1Hz . EEG data were recorded continuously and digitally stored. Description: The posterior dominant rhythm consists of 8-9 Hz activity of moderate voltage (25-35 uV) seen predominantly in posterior head regions, symmetric and reactive to eye opening and eye closing. EEG showed continuous generalized 3 to 6 Hz theta-delta slowing. Per eeg tech annotation, patient had left arm shaking ( difficult to visualize on video). Concomitant eeg before, during and after the event didn't show any eeg change to suggest seizure. Hyperventilation and photic stimulation were not performed.   ABNORMALITY - Continuous slow, generalized IMPRESSION: This study is suggestive of mild to moderate diffuse encephalopathy, nonspecific etiology. No seizures or epileptiform discharges were seen throughout the recording. Per eeg tech annotation, patient had left arm shaking ( difficult to visualize on video) without concomitant eeg change. However, focal motor seizures may not be seen on scalp eeg. Therefore, clinical correlation is recommended.   DG Chest 1 View  Result Date: 04/12/2020 CLINICAL DATA:  Pre MRI screening. EXAM: CHEST  1 VIEW COMPARISON:  August 06, 2018 FINDINGS: There is a dual lead AICD. Mildly decreased lung volumes are seen which is likely, in part, secondary to suboptimal patient inspiration. There is no evidence of acute infiltrate, pleural effusion or pneumothorax. The heart size and mediastinal contours are within normal limits. There is mild calcification of the aortic arch. The visualized skeletal structures are unremarkable. Radiopaque surgical clips are seen overlying the right upper quadrant, with an additional radiopaque surgical clip seen within the soft tissues of the neck on the left. No additional radiopaque foreign bodies are identified.  IMPRESSION: 1. Dual lead AICD. 2. No acute or active cardiopulmonary disease. Electronically Signed   By: Charlsie Quest M.D.   On: 04/12/2020 00:54   DG Abd 1 View  Result Date: 04/12/2020 CLINICAL DATA:  Pre MRI screening. EXAM: ABDOMEN - 1  VIEW COMPARISON:  None. FINDINGS: A large amount of stool is seen throughout diffusely prominent loops of large bowel. A paucity of small bowel gas is noted. There is no evidence of free air. A radiopaque surgical clip is seen overlying the right upper quadrant. Bilateral radiopaque pedicle screws are seen at the levels of L5 and S1. No radio-opaque calculi or other significant radiographic abnormality are seen. IMPRESSION: 1. Large stool burden with diffusely prominent large bowel. While this may be chronic in nature, sequelae associated with a distal large bowel obstruction cannot be excluded. 2. Findings consistent with prior cholecystectomy. Electronically Signed   By: Aram Candela M.D.   On: 04/12/2020 00:56   CT Head Wo Contrast  Result Date: 04/11/2020 CLINICAL DATA:  Altered mental status EXAM: CT HEAD WITHOUT CONTRAST TECHNIQUE: Contiguous axial images were obtained from the base of the skull through the vertex without intravenous contrast. COMPARISON:  June 30, 2019 FINDINGS: Brain: There is stable mild diffuse atrophy. There is no intracranial mass, hemorrhage, extra-axial fluid collection, or midline shift. There is evidence of a prior infarct in the superior posterior left parietal lobe, stable. There is patchy small vessel disease throughout the centra semiovale bilaterally. There is evidence of a prior small infarct in the right mid thalamus. There is also a prior infarct in the posterior left thalamus. No acute infarct is demonstrable on this study. Vascular: There is no hyperdense vessel. There is calcification in each carotid siphon region. Skull: Bony calvarium appears intact. Sinuses/Orbits: There is a small retention cyst in the medial  left maxillary antrum. There is mucosal thickening in several ethmoid air cells. Orbits appear symmetric bilaterally. Other: Mastoid air cells are clear. IMPRESSION: Stable atrophy with periventricular small vessel disease. Prior infarct in the posterosuperior left parietal lobe. Prior infarcts in each thalamus noted. No acute infarct. No mass or hemorrhage. There are foci of arterial vascular calcification. Areas of mild paranasal sinus disease noted. Electronically Signed   By: Bretta Bang III M.D.   On: 04/11/2020 21:15   DG Hip Unilat W or Wo Pelvis 2-3 Views Right  Result Date: 04/12/2020 CLINICAL DATA:  Evaluate right-sided femoral shortening EXAM: DG HIP (WITH OR WITHOUT PELVIS) 2-3V RIGHT COMPARISON:  None. FINDINGS: There is no evidence of an acute hip fracture or dislocation. Mild degenerative changes seen without evidence of other focal bone abnormality. Bilateral radiopaque pedicle screws are seen within the visualized portion of the lower lumbar spine. IMPRESSION: Degenerative and postoperative changes, without evidence of acute osseous abnormality. Electronically Signed   By: Aram Candela M.D.   On: 04/12/2020 00:41    Lab Data:  CBC: Recent Labs  Lab 04/11/20 2138 04/12/20 0932  WBC 7.5 7.1  NEUTROABS 4.8 3.7  HGB 11.8* 11.5*  HCT 37.6 37.3  MCV 87.6 91.6  PLT 214 205   Basic Metabolic Panel: Recent Labs  Lab 04/11/20 2138 04/12/20 0745  NA 131* 134*  K 4.3 3.7  CL 97* 99  CO2 25 25  GLUCOSE 118* 85  BUN 16 11  CREATININE 0.75 0.68  CALCIUM 8.2* 8.6*   GFR: Estimated Creatinine Clearance: 70.2 mL/min (by C-G formula based on SCr of 0.68 mg/dL). Liver Function Tests: Recent Labs  Lab 04/11/20 2138  AST 48*  ALT 28  ALKPHOS 98  BILITOT 0.5  PROT 7.4  ALBUMIN 3.4*   No results for input(s): LIPASE, AMYLASE in the last 168 hours. Recent Labs  Lab 04/12/20 0014  AMMONIA 20   Coagulation Profile:  Recent Labs  Lab 04/11/20 2138  INR 1.1    Cardiac Enzymes: No results for input(s): CKTOTAL, CKMB, CKMBINDEX, TROPONINI in the last 168 hours. BNP (last 3 results) No results for input(s): PROBNP in the last 8760 hours. HbA1C: No results for input(s): HGBA1C in the last 72 hours. CBG: No results for input(s): GLUCAP in the last 168 hours. Lipid Profile: No results for input(s): CHOL, HDL, LDLCALC, TRIG, CHOLHDL, LDLDIRECT in the last 72 hours. Thyroid Function Tests: Recent Labs    04/12/20 0113  TSH 0.597   Anemia Panel: No results for input(s): VITAMINB12, FOLATE, FERRITIN, TIBC, IRON, RETICCTPCT in the last 72 hours. Urine analysis:    Component Value Date/Time   COLORURINE AMBER (A) 04/11/2020 2330   APPEARANCEUR CLOUDY (A) 04/11/2020 2330   LABSPEC 1.014 04/11/2020 2330   PHURINE 5.0 04/11/2020 2330   GLUCOSEU NEGATIVE 04/11/2020 2330   HGBUR NEGATIVE 04/11/2020 2330   BILIRUBINUR NEGATIVE 04/11/2020 2330   BILIRUBINUR 1+ 11/19/2016 1307   KETONESUR NEGATIVE 04/11/2020 2330   PROTEINUR NEGATIVE 04/11/2020 2330   UROBILINOGEN 1.0 11/19/2016 1307   NITRITE POSITIVE (A) 04/11/2020 2330   LEUKOCYTESUR LARGE (A) 04/11/2020 2330     Kathleen Jensen M.D. [facility] 04/12/2020, 12:50 PM   Call night coverage person covering after 7pm

## 2020-04-12 NOTE — ED Notes (Signed)
RN spoke to daughter and gave her an update in regards to pts admission to floor to room 2W12. Pt was able to talk to daughter on the phone as well.

## 2020-04-12 NOTE — Consult Note (Signed)
Requesting Physician: Dr. Manus Gunning    Chief Complaint: Confusion, lethargy, nausea  History obtained from: Patient and Chart    HPI:                                                                                                                                       Kathleen Jensen is a 69 y.o. 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  Per EDP, patient brought in from [facility] after family noticed patient was acting "weird".  She also was more lethargic and complaining of nausea. Work-up in the ED included CT head which was unremarkable for acute findings.  Dilantin level was checked and was elevated 24.  UA was suggestive of urinary tract infection.     Past Medical History:  Diagnosis Date  . Aphasia   . Arthritis   . Asthma   . Cardiac pacemaker   . Cerebral amyloid angiopathy (CODE)   . CKD (chronic kidney disease)   . Cognitive communication deficit   . 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 GERD (gastroesophageal reflux disease)   . 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 Hyperlipemia   . Hypertension   . Insomnia   . Intracerebral hemorrhage, intraventricular (HCC)   . Muscle weakness   . Other abnormalities of gait and mobility   . Seizures (HCC)   . Stroke (HCC)   . Unsteadiness on feet   . Vitamin B deficiency     Past Surgical History:  Procedure Laterality Date  . BACK SURGERY     Dec 18, 2016 Dr.Torrealba  . BREAST BIOPSY Right   . BREAST EXCISIONAL BIOPSY    . CERVICAL SPINE SURGERY     C4  . CESAREAN SECTION    . GASTRIC BYPASS  03/2007  . TONSILLECTOMY      Family  History  Problem Relation Age of Onset  . High blood pressure Mother   . Diabetes Mother   . Diabetes Father   . Stroke Father   . Cancer Sister        Colorectal cancer  . Cancer Brother        colon   Social History:  reports that she quit smoking about 40 years ago. She quit after 4.00 years of use. She has never used smokeless tobacco. She reports that she does not drink alcohol and does not use drugs.  Allergies:  Allergies  Allergen Reactions  . Tape Rash  . Meperidine Hcl Nausea And Vomiting  . Sulfonamide Derivatives Itching  . Bacitracin-Polymyxin B Dermatitis    "Cloth Band-Aid only"  . Oxycodone Itching    Medications:  I reviewed home medications   ROS:                                                                                                                                     Unable to obtain due to patient's aphasia   Examination:                                                                                                      General: Appears well-developed and well-nourished.  Psych: Affect appropriate to situation Eyes: No scleral injection HENT: No OP obstrucion Head: Normocephalic.  Cardiovascular: Normal rate and regular rhythm.  Respiratory: Effort normal and breath sounds normal to anterior ascultation GI: Soft.  No distension. There is no tenderness.  Skin: WDI    Neurological Examination Mental Status: Alert, oriented only to person.  Cannot state month or age or where she is.  Unable to engage in conversation.  Follows some simple commands Cranial Nerves: II: Visual fields : Right homonymous hemianopsia III,IV, VI: ptosis not present, extra-ocular motions intact bilaterally, pupils equal, round, reactive to light and accommodation V,VII: smile symmetric, facial light touch sensation normal bilaterally XII: midline  tongue extension Motor: Right : Upper extremity   0/5    Left:     Upper extremity   5/5  Lower extremity   0/5     Lower extremity   5/5 Tone and bulk: Increased tone in the right upper and lower extremity Sensory: Reduced sensation in the right side compared to the left, withdraws/grimaces to noxious stimulus on the right side Plantars:  Right: downgoing   Left: downgoing Cerebellar: normal finger-to-nose on the left side      Lab Results: Basic Metabolic Panel: Recent Labs  Lab 04/11/20 12-01-2136  NA 131*  K 4.3  CL 97*  CO2 25  GLUCOSE 118*  BUN 16  CREATININE 0.75  CALCIUM 8.2*    CBC: Recent Labs  Lab 04/11/20 2136/12/01  WBC 7.5  NEUTROABS 4.8  HGB 11.8*  HCT 37.6  MCV 87.6  PLT 214    Coagulation Studies: Recent Labs    04/11/20 12/01/36  LABPROT 13.3  INR 1.1    Imaging: DG Chest 1 View  Result Date: 04/12/2020 CLINICAL DATA:  Pre MRI screening. EXAM: CHEST  1 VIEW COMPARISON:  August 06, 2018 FINDINGS: There is a dual lead AICD. Mildly decreased lung volumes are seen which is likely, in part, secondary to suboptimal patient inspiration. There is no evidence of acute infiltrate,  pleural effusion or pneumothorax. The heart size and mediastinal contours are within normal limits. There is mild calcification of the aortic arch. The visualized skeletal structures are unremarkable. Radiopaque surgical clips are seen overlying the right upper quadrant, with an additional radiopaque surgical clip seen within the soft tissues of the neck on the left. No additional radiopaque foreign bodies are identified. IMPRESSION: 1. Dual lead AICD. 2. No acute or active cardiopulmonary disease. Electronically Signed   By: Aram Candelahaddeus  Houston M.D.   On: 04/12/2020 00:54   DG Abd 1 View  Result Date: 04/12/2020 CLINICAL DATA:  Pre MRI screening. EXAM: ABDOMEN - 1 VIEW COMPARISON:  None. FINDINGS: A large amount of stool is seen throughout diffusely prominent loops of large bowel. A paucity of  small bowel gas is noted. There is no evidence of free air. A radiopaque surgical clip is seen overlying the right upper quadrant. Bilateral radiopaque pedicle screws are seen at the levels of L5 and S1. No radio-opaque calculi or other significant radiographic abnormality are seen. IMPRESSION: 1. Large stool burden with diffusely prominent large bowel. While this may be chronic in nature, sequelae associated with a distal large bowel obstruction cannot be excluded. 2. Findings consistent with prior cholecystectomy. Electronically Signed   By: Aram Candelahaddeus  Houston M.D.   On: 04/12/2020 00:56   CT Head Wo Contrast  Result Date: 04/11/2020 CLINICAL DATA:  Altered mental status EXAM: CT HEAD WITHOUT CONTRAST TECHNIQUE: Contiguous axial images were obtained from the base of the skull through the vertex without intravenous contrast. COMPARISON:  June 30, 2019 FINDINGS: Brain: There is stable mild diffuse atrophy. There is no intracranial mass, hemorrhage, extra-axial fluid collection, or midline shift. There is evidence of a prior infarct in the superior posterior left parietal lobe, stable. There is patchy small vessel disease throughout the centra semiovale bilaterally. There is evidence of a prior small infarct in the right mid thalamus. There is also a prior infarct in the posterior left thalamus. No acute infarct is demonstrable on this study. Vascular: There is no hyperdense vessel. There is calcification in each carotid siphon region. Skull: Bony calvarium appears intact. Sinuses/Orbits: There is a small retention cyst in the medial left maxillary antrum. There is mucosal thickening in several ethmoid air cells. Orbits appear symmetric bilaterally. Other: Mastoid air cells are clear. IMPRESSION: Stable atrophy with periventricular small vessel disease. Prior infarct in the posterosuperior left parietal lobe. Prior infarcts in each thalamus noted. No acute infarct. No mass or hemorrhage. There are foci of  arterial vascular calcification. Areas of mild paranasal sinus disease noted. Electronically Signed   By: Bretta BangWilliam  Woodruff III M.D.   On: 04/11/2020 21:15   DG Hip Unilat W or Wo Pelvis 2-3 Views Right  Result Date: 04/12/2020 CLINICAL DATA:  Evaluate right-sided femoral shortening EXAM: DG HIP (WITH OR WITHOUT PELVIS) 2-3V RIGHT COMPARISON:  None. FINDINGS: There is no evidence of an acute hip fracture or dislocation. Mild degenerative changes seen without evidence of other focal bone abnormality. Bilateral radiopaque pedicle screws are seen within the visualized portion of the lower lumbar spine. IMPRESSION: Degenerative and postoperative changes, without evidence of acute osseous abnormality. Electronically Signed   By: Aram Candelahaddeus  Houston M.D.   On: 04/12/2020 00:41     I have reviewed the above imaging : CT head   ASSESSMENT AND PLAN  69 y.o. female with past medical history significant for stroke with resultant aphasia and right-sided weakness, diabetes mellitus, hypertension, hyperlipidemia, seizure disorder on 3 antiepileptic agents-Keppra,  Vimpat, Dilantin presents to the ED after having acute confusion, nausea and lethargy.  Differentials include focal nonconvulsive seizure with postictal state versus encephalopathy in the setting of urinary tract infection causing recrudescence of old stroke symptoms.   Recommendations -Continue to treat urinary tract infection -Routine EEG -Hold 1 dose of Dilantin and resume home dose -Continue Keppra and Vimpat at home doses -Seizure precautions  Wilburt Messina [facility] Pager Number [phone]

## 2020-04-12 NOTE — ED Notes (Signed)
Patient transported to X-ray 

## 2020-04-12 NOTE — ED Provider Notes (Signed)
Care assumed from Dr. Judd Lien.  Level 5 caveat for altered mental status.  Patient with history of previous stroke and aphasia and right hemiplegia here with acting confused and change in mental status for the past day.  Unclear when she was last seen normal.  No fever.  Appears to have expressive and receptive aphasia.  Will not follow commands on the right.  States that her "daughter has been killing people".  Neurology will see patient.  Do not feel her elevated Dilantin level is responsible for her altered mental status.  Unable to have MRI due to pacemaker, though apparently did have MRI at [facility] in June.   Has had UTI which is treated with Rocephin. Unclear if this is the source of her altered mental status. Phenytoin being held due to elevated level.  Admission discussed with Dr. Jay Schlichter, MD 04/12/20 [phone]

## 2020-04-12 NOTE — ED Notes (Signed)
Please call daughter C. Susann Givens with update [phone]

## 2020-04-12 NOTE — Progress Notes (Signed)
EEG complete - results pending 

## 2020-04-12 NOTE — H&P (Signed)
History and Physical    Kathleen Jensen VFI:[id number] DOB: 1951-01-09 DOA: 04/11/2020  PCP: Patient, No Pcp Per  Patient coming from: SNF - [name]  I have personally briefly reviewed patient's old medical records in [facility]  Chief Complaint: AMS  HPI: Kathleen Jensen is a 69 y.o. female with medical history significant of prior stroke with residual aphasia, R sided hemiplegia and R sided hemineglect.  Also seizure disorder as a result on multiple seizure meds.  Pt with admission to [facility] at end of May for breakthrough seizures.  Pt followed by [facility] neurology.  Pt brought in to ED today with c/o new onset AMS.  Pt was outside with family and came back inside "acting weird".  Pt unable to contribute much to history due to severe aphasia.   ED Course: Pt apparently altered in ED, telling EDP that her "daughter has been killing people".  Not seeing any of this on my exam though pt with pretty profound aphasia.  Not following commands for EDP on the R side, though review of recent neurologist visit just 6 days ago suggests that its quite difficult to get her to follow commands on the right side at baseline.  CT head neg.  Does have UTI it appears, started on rocephin.   Review of Systems: Unable to perform due to aphasia.  Past Medical History:  Diagnosis Date  . Aphasia   . Arthritis   . Asthma   . Cardiac pacemaker   . Cerebral amyloid angiopathy (CODE)   . CKD (chronic kidney disease)   . Cognitive communication deficit   . 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 GERD (gastroesophageal reflux disease)   . 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 Hyperlipemia   . Hypertension   . Insomnia   . Intracerebral hemorrhage, intraventricular (HCC)   . Muscle weakness   . Other abnormalities of gait and mobility   . Seizures (HCC)   .  Stroke (HCC)   . Unsteadiness on feet   . Vitamin B deficiency     Past Surgical History:  Procedure Laterality Date  . BACK SURGERY     Dec 18, 2016 Dr.Torrealba  . BREAST BIOPSY Right   . BREAST EXCISIONAL BIOPSY    . CERVICAL SPINE SURGERY     C4  . CESAREAN SECTION    . GASTRIC BYPASS  03/2007  . TONSILLECTOMY       reports that she quit smoking about 40 years ago. She quit after 4.00 years of use. She has never used smokeless tobacco. She reports that she does not drink alcohol and does not use drugs.  Allergies  Allergen Reactions  . Tape Rash  . Meperidine Hcl Nausea And Vomiting  . Sulfonamide Derivatives Itching  . Bacitracin-Polymyxin B Dermatitis    "Cloth Band-Aid only"  . Oxycodone Itching    Family History  Problem Relation Age of Onset  . High blood pressure Mother   . Diabetes Mother   . Diabetes Father   . Stroke Father   . Cancer Sister        Colorectal cancer  . Cancer Brother        colon     Prior to Admission medications   Medication Sig Start Date End Date Taking? Authorizing Provider  acetaminophen (TYLENOL) 500 MG tablet Take 1,000 mg by mouth 2 (two) times daily as needed.  Yes [provider]  B Complex-C-Folic Acid TABS Take 1 tablet by mouth daily.   Yes [provider]  Baclofen 5 MG TABS Take 5 mg by mouth in the morning and at bedtime.   Yes [provider]  carbamazepine (TEGRETOL) 200 MG tablet Take 200 mg by mouth in the morning and at bedtime.   Yes [provider]  gabapentin (NEURONTIN) 300 MG capsule Take 1 capsule by mouth in the morning and at bedtime.  01/21/18  Yes [provider]  levETIRAcetam (KEPPRA) 1000 MG tablet 2 po bid Patient taking differently: Take 2,000 mg by mouth 2 (two) times daily.  12/11/17  Yes Donato SchultzLowne Chase, Yvonne R, DO  levothyroxine (SYNTHROID) 75 MCG tablet Take 75 mcg by mouth daily before breakfast.   Yes [provider]   losartan-hydrochlorothiazide (HYZAAR) 50-12.5 MG tablet Take 1 tablet by mouth daily.   Yes [provider]  magnesium oxide (MAG-OX) 400 MG tablet Take 400 mg by mouth 2 (two) times daily.   Yes [provider]  meclizine (ANTIVERT) 12.5 MG tablet Take 12.5 mg by mouth 3 (three) times daily as needed for dizziness.   Yes [provider]  phenytoin (DILANTIN) 100 MG ER capsule Take 100-200 mg by mouth See admin instructions. 200 mg in the morning, 100 mg midday, 200 mg at bedtime   Yes [provider]  polycarbophil (FIBERCON) 625 MG tablet Take 1,250 mg by mouth daily. With 8 oz of water.    Yes [provider]  polyethylene glycol (MIRALAX / GLYCOLAX) packet Take 17 g by mouth daily. Mix with 6 - 8 oz liquid.   Yes [provider]  senna-docusate (SENOKOT-S) 8.6-50 MG tablet Take 1 tablet by mouth 2 (two) times daily.   Yes [provider]  simvastatin (ZOCOR) 10 MG tablet Take 10 mg by mouth daily.   Yes [provider]  venlafaxine XR (EFFEXOR-XR) 75 MG 24 hr capsule Take 150 mg by mouth daily.   Yes [provider]  Vitamin D, Ergocalciferol, (DRISDOL) 1.25 MG (50000 UNIT) CAPS capsule Take 50,000 Units by mouth every 30 (thirty) days.   Yes [provider]  albuterol (PROVENTIL HFA;VENTOLIN HFA) 108 (90 Base) MCG/ACT inhaler Inhale 2 puffs into the lungs every 6 (six) hours as needed for wheezing or shortness of breath. Patient not taking: Reported on 11/15/2019 03/13/18   Zola ButtonLowne Chase, Grayling CongressYvonne R, DO  hydrochlorothiazide (MICROZIDE) 12.5 MG capsule TAKE 1 CAPSULE (12.5 MG TOTAL) BY MOUTH DAILY. Patient not taking: Reported on 04/12/2020 06/01/18   Donato SchultzLowne Chase, Yvonne R, DO  HYDROcodone-acetaminophen (NORCO/VICODIN) 5-325 MG tablet Take 1 tablet by mouth every 4 (four) hours as needed. Patient not taking: Reported on 04/12/2020 11/15/19   Bethel BornGekas, Kelly Marie, PA-C  mirtazapine (REMERON) 30 MG tablet Take 1 tablet  (30 mg total) by mouth at bedtime. Patient not taking: Reported on 11/15/2019 04/22/18   Donato SchultzLowne Chase, Yvonne R, DO  Misc. Devices (TRANSFER BOARD) MISC 1 each by Does not apply route as needed (For Bed Transfer). Patient not taking: Reported on 11/15/2019 04/23/18   Zola ButtonLowne Chase, Grayling CongressYvonne R, DO  pantoprazole (PROTONIX) 40 MG tablet Take 1 tablet (40 mg total) by mouth daily. Patient not taking: Reported on 11/15/2019 01/12/18   Zola ButtonLowne Chase, Grayling CongressYvonne R, DO  tiZANidine (ZANAFLEX) 4 MG tablet Take 1 tablet (4 mg total) by mouth every 6 (six) hours as needed for muscle spasms. Patient not taking: Reported on 11/15/2019 03/03/18  Zola Button, Yvonne R, DO  valACYclovir (VALTREX) 1000 MG tablet Take 1 tablet (1,000 mg total) by mouth 3 (three) times daily. Patient not taking: Reported on 11/15/2019 03/03/18   Donato Schultz, DO  warfarin (COUMADIN) 5 MG tablet 10 mg on mon and 5 mg all other days Patient not taking: Reported on 11/15/2019 12/08/17   Donato Schultz, DO    Physical Exam: Vitals:   04/11/20 2030 04/11/20 2031 04/11/20 2145 04/11/20 2345  BP: (!) 159/88  (!) 153/87 (!) 158/88  Pulse: 62  60 60  Resp: Temp: 98.1 F (36.7 C)     TempSrc: Oral     SpO2: 100%  100% 100%  Weight:  85.5 kg    Height:   (1.626 m)      Constitutional: NAD, calm, comfortable Eyes: PERRL, lids and conjunctivae normal ENMT: Mucous membranes are moist. Posterior pharynx clear of any exudate or lesions.Normal dentition.  Neck: normal, supple, no masses, no thyromegaly Respiratory: clear to auscultation bilaterally, no wheezing, no crackles. Normal respiratory effort. No accessory muscle use.  Cardiovascular: Regular rate and rhythm, no murmurs / rubs / gallops. No extremity edema. 2+ pedal pulses. No carotid bruits.  Abdomen: no tenderness, no masses palpated. No hepatosplenomegaly. Bowel sounds positive.  Musculoskeletal: no clubbing / cyanosis. No joint deformity upper and lower  extremities. Good ROM, no contractures. Normal muscle tone.  Skin: no rashes, lesions, ulcers. No induration Neurologic: Severe aphasia, not following commands on R side.  Looks like much of this may be baseline. Psychiatric: Severe aphasia   Labs on Admission: I have personally reviewed following labs and imaging studies  CBC: Recent Labs  Lab 04/11/20 2138  WBC 7.5  NEUTROABS 4.8  HGB 11.8*  HCT 37.6  MCV 87.6  PLT 214   Basic Metabolic Panel: Recent Labs  Lab 04/11/20 2138  NA 131*  K 4.3  CL 97*  CO2 25  GLUCOSE 118*  BUN 16  CREATININE 0.75  CALCIUM 8.2*   GFR: Estimated Creatinine Clearance: 70.2 mL/min (by C-G formula based on SCr of 0.75 mg/dL). Liver Function Tests: Recent Labs  Lab 04/11/20 2138  AST 48*  ALT 28  ALKPHOS 98  BILITOT 0.5  PROT 7.4  ALBUMIN 3.4*   No results for input(s): LIPASE, AMYLASE in the last 168 hours. No results for input(s): AMMONIA in the last 168 hours. Coagulation Profile: Recent Labs  Lab 04/11/20 2138  INR 1.1   Cardiac Enzymes: No results for input(s): CKTOTAL, CKMB, CKMBINDEX, TROPONINI in the last 168 hours. BNP (last 3 results) No results for input(s): PROBNP in the last 8760 hours. HbA1C: No results for input(s): HGBA1C in the last 72 hours. CBG: No results for input(s): GLUCAP in the last 168 hours. Lipid Profile: No results for input(s): CHOL, HDL, LDLCALC, TRIG, CHOLHDL, LDLDIRECT in the last 72 hours. Thyroid Function Tests: No results for input(s): TSH, T4TOTAL, FREET4, T3FREE, THYROIDAB in the last 72 hours. Anemia Panel: No results for input(s): VITAMINB12, FOLATE, FERRITIN, TIBC, IRON, RETICCTPCT in the last 72 hours. Urine analysis:    Component Value Date/Time   COLORURINE AMBER (A) 04/11/2020 2330   APPEARANCEUR CLOUDY (A) 04/11/2020 2330   LABSPEC 1.014 04/11/2020 2330   PHURINE 5.0 04/11/2020 2330   GLUCOSEU NEGATIVE 04/11/2020 2330   HGBUR NEGATIVE 04/11/2020 2330   BILIRUBINUR  NEGATIVE 04/11/2020 2330   BILIRUBINUR 1+ 11/19/2016 1307   KETONESUR NEGATIVE 04/11/2020 2330   PROTEINUR  NEGATIVE 04/11/2020 2330   UROBILINOGEN 1.0 11/19/2016 1307   NITRITE POSITIVE (A) 04/11/2020 2330   LEUKOCYTESUR LARGE (A) 04/11/2020 2330    Radiological Exams on Admission: DG Chest 1 View  Result Date: 04/12/2020 CLINICAL DATA:  Pre MRI screening. EXAM: CHEST  1 VIEW COMPARISON:  August 06, 2018 FINDINGS: There is a dual lead AICD. Mildly decreased lung volumes are seen which is likely, in part, secondary to suboptimal patient inspiration. There is no evidence of acute infiltrate, pleural effusion or pneumothorax. The heart size and mediastinal contours are within normal limits. There is mild calcification of the aortic arch. The visualized skeletal structures are unremarkable. Radiopaque surgical clips are seen overlying the right upper quadrant, with an additional radiopaque surgical clip seen within the soft tissues of the neck on the left. No additional radiopaque foreign bodies are identified. IMPRESSION: 1. Dual lead AICD. 2. No acute or active cardiopulmonary disease. Electronically Signed   By: Aram Candela M.D.   On: 04/12/2020 00:54   DG Abd 1 View  Result Date: 04/12/2020 CLINICAL DATA:  Pre MRI screening. EXAM: ABDOMEN - 1 VIEW COMPARISON:  None. FINDINGS: A large amount of stool is seen throughout diffusely prominent loops of large bowel. A paucity of small bowel gas is noted. There is no evidence of free air. A radiopaque surgical clip is seen overlying the right upper quadrant. Bilateral radiopaque pedicle screws are seen at the levels of L5 and S1. No radio-opaque calculi or other significant radiographic abnormality are seen. IMPRESSION: 1. Large stool burden with diffusely prominent large bowel. While this may be chronic in nature, sequelae associated with a distal large bowel obstruction cannot be excluded. 2. Findings consistent with prior cholecystectomy.  Electronically Signed   By: Aram Candela M.D.   On: 04/12/2020 00:56   CT Head Wo Contrast  Result Date: 04/11/2020 CLINICAL DATA:  Altered mental status EXAM: CT HEAD WITHOUT CONTRAST TECHNIQUE: Contiguous axial images were obtained from the base of the skull through the vertex without intravenous contrast. COMPARISON:  June 30, 2019 FINDINGS: Brain: There is stable mild diffuse atrophy. There is no intracranial mass, hemorrhage, extra-axial fluid collection, or midline shift. There is evidence of a prior infarct in the superior posterior left parietal lobe, stable. There is patchy small vessel disease throughout the centra semiovale bilaterally. There is evidence of a prior small infarct in the right mid thalamus. There is also a prior infarct in the posterior left thalamus. No acute infarct is demonstrable on this study. Vascular: There is no hyperdense vessel. There is calcification in each carotid siphon region. Skull: Bony calvarium appears intact. Sinuses/Orbits: There is a small retention cyst in the medial left maxillary antrum. There is mucosal thickening in several ethmoid air cells. Orbits appear symmetric bilaterally. Other: Mastoid air cells are clear. IMPRESSION: Stable atrophy with periventricular small vessel disease. Prior infarct in the posterosuperior left parietal lobe. Prior infarcts in each thalamus noted. No acute infarct. No mass or hemorrhage. There are foci of arterial vascular calcification. Areas of mild paranasal sinus disease noted. Electronically Signed   By: Bretta Bang III M.D.   On: 04/11/2020 21:15   DG Hip Unilat W or Wo Pelvis 2-3 Views Right  Result Date: 04/12/2020 CLINICAL DATA:  Evaluate right-sided femoral shortening EXAM: DG HIP (WITH OR WITHOUT PELVIS) 2-3V RIGHT COMPARISON:  None. FINDINGS: There is no evidence of an acute hip fracture or dislocation. Mild degenerative changes seen without evidence of other focal bone abnormality. Bilateral  radiopaque pedicle screws are seen within the visualized portion of the lower lumbar spine. IMPRESSION: Degenerative and postoperative changes, without evidence of acute osseous abnormality. Electronically Signed   By: Aram Candela M.D.   On: 04/12/2020 00:41    EKG: Independently reviewed.  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 Acute encephalopathy - 1. DDx includes but not limited to delirium due to UTI, breakthrough seizures, and new stroke 2. Treat UTI 3. Get neurology to see pt to r/o breakthrough seizures / new stroke. 4. MRI ordered but apparently pt with MRI UNSAFE pacemaker according to tech 1. Though pt did have MRI at [facility] June 1st 2021. 2. UTI - 1. Rocephin 2. UCx ordered 3. Seizure disorder - 1. Phenytoin being held at the moment due to level of 24, putting in phenytoin per pharm consult for thereafter dose adjusting. 2. Tegretol level pending 3. Cont other seizure meds (keppra, neurontin, vimpat) for the moment. 4. Neuro consult as noted above. 4. Prior stroke with severe aphasia, R hemiplegia - 1. Neuro consult as noted above 5. HTN - 1. Cont home BP meds  DVT prophylaxis: Lovenox Code Status: Full Family Communication: No family in room Disposition Plan: SNF after AMS worked up [employer] called: Neuro Admission status: Place in [place], Isabella Ida M. DO [facility]  How to contact the [facility] Attending or Consulting provider 7A - 7P or covering provider during after hours 7P -7A, for this patient?  1. Check the care team in [facility] and look for a) attending/consulting [facility] provider listed and b) the [facility] team listed 2. Log into [email or web address]  Amion Physician Scheduling and messaging for groups and whole hospitals  On call and  physician scheduling software for group practices, residents, hospitalists and other medical providers for call, clinic, rotation and shift schedules. On[employer] is a hospital-wide system for scheduling doctors and paging doctors on call. EasyPlot is for scientific plotting and data analysis.  [email or web address]  and use [place]'s universal password to access. If you do not have the password, please contact the hospital operator.  3. Locate the [facility] provider you are looking for under [facility] and page to a number that you can be directly reached. 4. If you still have difficulty reaching the provider, please page the [facility] (Director on Call) for the Hospitalists listed on amion for assistance.  04/12/2020, 1:20 AM

## 2020-04-12 NOTE — ED Notes (Signed)
Ordered hospital bed for pt comfort °

## 2020-04-12 NOTE — ED Notes (Signed)
On arrival to shift this pt was not able to hold a conversation. She was confused. For example when asked where she is she would answer "ten" and continue to ramble about non-relative information.   Pt is now alert and oriented. Abel to hold a conversation and is feeding self.

## 2020-04-12 NOTE — ED Notes (Signed)
Lunch tray at bedside. ?

## 2020-04-12 NOTE — ED Notes (Signed)
O2 removed sats 100% room air

## 2020-04-12 NOTE — Progress Notes (Signed)
PT has an MRI UNSAFE pacemaker.

## 2020-04-12 NOTE — Procedures (Signed)
Patient Name: Kathleen Jensen  MRN: [id number]  Epilepsy Attending: Charlsie Quest  Referring Physician/Provider: Dr Georgiana Spinner Aroor Date: 04/12/2020 Duration: 23.26 mins  Patient history: 69 y.o. 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 EEG to evaluate for seizure.   Level of alertness: Awake  AEDs during EEG study: CBZ, LEV, GBP  Technical aspects: This EEG study was done with scalp electrodes positioned according to the 10-20 International system of electrode placement. Electrical activity was acquired at a sampling rate of 500Hz  and reviewed with a high frequency filter of 70Hz  and a low frequency filter of 1Hz . EEG data were recorded continuously and digitally stored.   Description: The posterior dominant rhythm consists of 8-9 Hz activity of moderate voltage (25-35 uV) seen predominantly in posterior head regions, symmetric and reactive to eye opening and eye closing. EEG showed continuous generalized 3 to 6 Hz theta-delta slowing. Per eeg tech annotation, patient had left arm shaking ( difficult to visualize on video). Concomitant eeg before, during and after the event didn't show any eeg change to suggest seizure. Hyperventilation and photic stimulation were not performed.     ABNORMALITY - Continuous slow, generalized   IMPRESSION: This study is suggestive of mild to moderate diffuse encephalopathy, nonspecific etiology. No seizures or epileptiform discharges were seen throughout the recording.  Per eeg tech annotation, patient had left arm shaking ( difficult to visualize on video) without concomitant eeg change. However, focal motor seizures may not be seen on scalp eeg. Therefore, clinical correlation is recommended.  [name] 

## 2020-04-13 ENCOUNTER — Encounter (HOSPITAL_COMMUNITY): Payer: Self-pay | Admitting: Internal Medicine

## 2020-04-13 LAB — URINE CULTURE: Culture: 90000 — AB

## 2020-04-13 LAB — PHENYTOIN LEVEL, TOTAL: Phenytoin Lvl: 16.9 ug/mL (ref 10.0–20.0)

## 2020-04-13 MED ORDER — PHENYTOIN SODIUM EXTENDED 100 MG PO CAPS
200.0000 mg | ORAL_CAPSULE | Freq: Two times a day (BID) | ORAL | Status: DC
  Administered 2020-04-14: 200 mg via ORAL
  Filled 2020-04-13: qty 2

## 2020-04-13 MED ORDER — INFLUENZA VAC A&B SA ADJ QUAD 0.5 ML IM PRSY
0.5000 mL | PREFILLED_SYRINGE | INTRAMUSCULAR | Status: DC
  Filled 2020-04-13: qty 0.5

## 2020-04-13 MED ORDER — PHENYTOIN 50 MG PO CHEW: 200.0000 mg | CHEWABLE_TABLET | Freq: Two times a day (BID) | ORAL | Status: DC

## 2020-04-13 NOTE — Progress Notes (Addendum)
MEDICATION RELATED CONSULT NOTE  Pharmacy Consult for Phenytoin Indication: h/o seizures  Allergies  Allergen Reactions  . Tape Rash  . Meperidine Hcl Nausea And Vomiting  . Sulfonamide Derivatives Itching  . Bacitracin-Polymyxin B Dermatitis    "Cloth Band-Aid only"  . Oxycodone Itching    Patient Measurements: Height: 5\' 4"  (162.6 cm) Weight: 86.3 kg (190 lb 4.1 oz) IBW/kg (Calculated) : 54.7  Vital Signs: Temp: 97.7 F (36.5 C) (09/16 0749) BP: 128/75 (09/16 0749) Pulse Rate: 60 (09/16 0749) Intake/Output from previous day: 09/15 0701 - 09/16 0700 In: -  Out: 450 [Urine:450] Intake/Output from this shift: No intake/output data recorded.  Labs: Recent Labs    04/11/20 2138 04/12/20 0745 04/12/20 0932  WBC 7.5  --  7.1  HGB 11.8*  --  11.5*  HCT 37.6  --  37.3  PLT 214  --  205  CREATININE 0.75 0.68  --   ALBUMIN 3.4*  --   --   PROT 7.4  --   --   AST 48*  --   --   ALT 28  --   --   ALKPHOS 98  --   --   BILITOT 0.5  --   --    Estimated Creatinine Clearance: 70.5 mL/min (by C-G formula based on SCr of 0.68 mg/dL).   Microbiology: Recent Results (from the past 720 hour(s))  SARS Coronavirus 2 by RT PCR (hospital order, performed in [facility] hospital lab) Nasopharyngeal Nasopharyngeal Swab     Status: None   Collection Time: 04/11/20 11:55 PM   Specimen: Nasopharyngeal Swab  Result Value Ref Range Status   SARS Coronavirus 2 NEGATIVE NEGATIVE Final    Comment: (NOTE) SARS-CoV-2 target nucleic acids are NOT DETECTED.  The SARS-CoV-2 RNA is generally detectable in upper and lower respiratory specimens during the acute phase of infection. The lowest concentration of SARS-CoV-2 viral copies this assay can detect is 250 copies / mL. A negative result does not preclude SARS-CoV-2 infection and should not be used as the sole basis for treatment or other patient management decisions.  A negative result may occur with improper specimen collection /  handling, submission of specimen other than nasopharyngeal swab, presence of viral mutation(s) within the areas targeted by this assay, and inadequate number of viral copies (<250 copies / mL). A negative result must be combined with clinical observations, patient history, and epidemiological information.  Fact Sheet for Patients:   04/13/20  Fact Sheet for Healthcare Providers: BoilerBrush.com.cy  This test is not yet approved or  cleared by the https://pope.com/ FDA and has been authorized for detection and/or diagnosis of SARS-CoV-2 by FDA under an Emergency Use Authorization (EUA).  This EUA will remain in effect (meaning this test can be used) for the duration of the COVID-19 declaration under Section 564(b)(1) of the Act, 21 U.S.C. section 360bbb-3(b)(1), unless the authorization is terminated or revoked sooner.  Performed at [facility], [address], [place], [place]   Urine Culture     Status: Abnormal   Collection Time: 04/12/20 12:21 AM   Specimen: Urine, Random  Result Value Ref Range Status   Specimen Description URINE, RANDOM  Final   Special Requests   Final    NONE Performed at [facility], [address] [place], [place]    Culture 90,000 COLONIES/mL ESCHERICHIA COLI (A)  Final   Report Status 04/13/2020 FINAL  Final   Organism ID, Bacteria ESCHERICHIA  COLI (A)  Final      Susceptibility   Escherichia coli - MIC*    AMPICILLIN 16 INTERMEDIATE Intermediate     CEFAZOLIN <=4 SENSITIVE Sensitive     CEFTRIAXONE <=0.25 SENSITIVE Sensitive     CIPROFLOXACIN <=0.25 SENSITIVE Sensitive     GENTAMICIN <=1 SENSITIVE Sensitive     IMIPENEM <=0.25 SENSITIVE Sensitive     NITROFURANTOIN <=16 SENSITIVE Sensitive     TRIMETH/SULFA <=20 SENSITIVE Sensitive     AMPICILLIN/SULBACTAM <=2 SENSITIVE Sensitive     PIP/TAZO <=4 SENSITIVE Sensitive     * 90,000 COLONIES/mL ESCHERICHIA  COLI     Assessment: 62 YOF presents with nausea and fatigue. Patient is on phenytoin 200mg  PO BID with 100mg  PO at midday, Keppra 2000mg  PO BID, gabapentin 100mg  PO BID and carbamazepine 200mg  BID for history of seizures, per Neuro's outpatient note on 04/06/20.  Dilantin level was supra-therapeutic on admission and has trended down, so Pharmacy has been consulted to restart Dilantin.  No seizure reported.  9/14 DPH level = 24.2, albumin 3.4 >> corrects to 31 mcg/mL 9/15 CBZ level = 2 mcg/mL (goal 4-12) 9/16 DPH  = 16.9 >> corrects to 21.7 mcg/mL  Goal of Therapy:  Dilantin level 15-20 mcg/ml; prevention of seizures  Plan:  Restart DPH at a reduced dose of 200mg  PO BID on 9/17 Monitor renal fxn, clinical progress, recheck DPH level at Css F/u with reducing gabapentin dose  Tayvien Kane D. 10/14, PharmD, BCPS, BCCCP 04/13/2020, 3:47 PM

## 2020-04-13 NOTE — NC FL2 (Signed)
[place] MEDICAID FL2 LEVEL OF CARE SCREENING TOOL     IDENTIFICATION  Patient Name: Kathleen Jensen Birthdate: 1950-11-07 Sex: female Admission Date (Current Location): 04/11/2020  [facility] and [place] Number:  Producer, television/film/video and Address:  [facility] [place], [address] [place], [place] [address]      Provider Number: [id number]  Attending Physician Name and Address:  Cathren Harsh, MD  Relative Name and Phone Number:  Parke Poisson, Daughter   [phone]    Current Level of Care: Hospital Recommended Level of Care: Assisted Living Facility Prior Approval Number:    Date Approved/Denied:   PASRR Number: [id number]  Discharge Plan: SNF    Current Diagnoses: Patient Active Problem List   Diagnosis Date Noted  . Right hemiplegia (HCC) 04/12/2020  . Acute encephalopathy 04/12/2020  . Acute lower UTI 04/12/2020  . Seizure (HCC) 04/12/2020  . 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 Hyperlipidemia associated with type 2 diabetes mellitus (HCC) 07/30/2018  . History of cerebral infarction 07/30/2018  . Vitamin B 12 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/30/2018  . Asthma 03/13/2018  . Seizures (HCC) 01/12/2018  . CVA (cerebral vascular accident) (HCC) 12/08/2017  . Abnormal thyroid blood test 12/08/2017  . Pulmonary embolism and infarction (HCC) 12/08/2017  . Anemia 12/08/2017  . Expressive aphasia 10/15/2017  . Seizure disorder (HCC) 10/15/2017  . Dysphagia 10/15/2017  . Diabetes mellitus without complication (HCC)   . Hyperlipidemia 09/22/2017  . Urinary frequency 09/22/2017  . Pre-operative clearance 11/19/2016  . Airway hyperreactivity 01/26/2016  . Disease of thyroid gland 01/26/2016  . 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 Other specified postprocedural states 01/26/2016  . 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 Chronic LBP 04/10/2015  . Degeneration of intervertebral disc of lumbar region  04/10/2015  . Spondylolisthesis of lumbar region 04/10/2015  . 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  . History of cardiac pacemaker in situ 01/09/2015  . B12 DEFICIENCY 08/15/2008  . UNSPECIFIED VITAMIN D DEFICIENCY 08/15/2008  . SINUS BRADYCARDIA 08/15/2008  . OBSTRUCTIVE SLEEP APNEA 08/11/2008  . INSOMNIA 04/28/2008  . Hyperlipidemia LDL goal <70 12/12/2006  . RHINITIS, ALLERGIC NEC 12/12/2006  . Hypothyroidism 12/11/2006  . Essential hypertension 12/11/2006  . GERD 12/11/2006  . STRESS INCONTINENCE 12/11/2006    Orientation RESPIRATION BLADDER Height & Weight     Self  Normal Continent Weight: 190 lb 4.1 oz (86.3 kg) Height:  5\' 4"  (162.6 cm)  BEHAVIORAL SYMPTOMS/MOOD NEUROLOGICAL BOWEL NUTRITION STATUS    Convulsions/Seizures Incontinent Diet (Heart diet. See discharge summary.)  AMBULATORY STATUS COMMUNICATION OF NEEDS Skin   Total Care Verbally Normal                       Personal Care Assistance Level of Assistance  Bathing, Feeding, Dressing Bathing Assistance: Maximum assistance Feeding assistance: Limited assistance Dressing Assistance: Maximum assistance     Functional Limitations Info  Sight, Hearing, Speech Sight Info: Adequate Hearing Info: Adequate Speech Info: Adequate    SPECIAL CARE FACTORS FREQUENCY                       Contractures Contractures Info: Not present    Additional Factors Info  Code Status, Allergies Code Status Info: full Allergies Info: Tape, Meperidine Hcl, Sulfonamide Derivatives, Bacitracin-polymyxin B, Oxycodone           Current Medications (04/13/2020):  This is the current hospital active medication list Current  Facility-Administered Medications  Medication Dose Route Frequency Provider Last Rate Last Admin  . acetaminophen (TYLENOL) tablet 650 mg  650 mg Oral Q6H PRN Hillary Bow, DO       Or  . acetaminophen (TYLENOL) suppository 650 mg  650 mg Rectal Q6H PRN Hillary Bow, DO       . B-complex with vitamin C tablet 1 tablet  1 tablet Oral Daily Hillary Bow, DO   1 tablet at 04/13/20 0836  . baclofen (LIORESAL) tablet 5 mg  5 mg Oral BID Lyda Perone M, DO   5 mg at 04/13/20 6433  . carbamazepine (TEGRETOL) tablet 200 mg  200 mg Oral BID Lyda Perone M, DO   200 mg at 04/13/20 1103  . cefTRIAXone (ROCEPHIN) 1 g in sodium chloride 0.9 % 100 mL IVPB  1 g Intravenous Q24H Lyda Perone M, DO 200 mL/hr at 04/13/20 0150 1 g at 04/13/20 0150  . enoxaparin (LOVENOX) injection 40 mg  40 mg Subcutaneous Daily Lyda Perone M, DO   40 mg at 04/13/20 0840  . gabapentin (NEURONTIN) capsule 300 mg  300 mg Oral BID Lyda Perone M, DO   300 mg at 04/13/20 2951  . losartan (COZAAR) tablet 50 mg  50 mg Oral Daily Lyda Perone M, DO   50 mg at 04/13/20 8841   And  . hydrochlorothiazide (MICROZIDE) capsule 12.5 mg  12.5 mg Oral Daily Hillary Bow, DO   12.5 mg at 04/13/20 6606  . [START ON 04/14/2020] influenza vaccine adjuvanted (FLUAD) injection 0.5 mL  0.5 mL Intramuscular Tomorrow-1000 Lyda Perone M, DO      . levETIRAcetam (KEPPRA) tablet 2,000 mg  2,000 mg Oral BID Lyda Perone M, DO   2,000 mg at 04/13/20 0836  . levothyroxine (SYNTHROID) tablet 75 mcg  75 mcg Oral QAC breakfast Hillary Bow, DO   75 mcg at 04/13/20 3016  . magnesium oxide (MAG-OX) tablet 400 mg  400 mg Oral BID Lyda Perone M, DO   400 mg at 04/13/20 0109  . meclizine (ANTIVERT) tablet 12.5 mg  12.5 mg Oral TID PRN Hillary Bow, DO      . ondansetron [facility]) tablet 4 mg  4 mg Oral Q6H PRN Hillary Bow, DO       Or  . ondansetron [facility]) injection 4 mg  4 mg Intravenous Q6H PRN Hillary Bow, DO      . [START ON 04/14/2020] phenytoin (DILANTIN) ER capsule 200 mg  200 mg Oral BID Dang, Thuy D, RPH      . polycarbophil (FIBERCON) tablet 1,250 mg  1,250 mg Oral Daily Lyda Perone M, DO   1,250 mg at 04/13/20 0839  . polyethylene glycol (MIRALAX / GLYCOLAX) packet 17 g  17 g Oral  Daily Hillary Bow, DO   17 g at 04/13/20 0839  . senna-docusate (Senokot-S) tablet 1 tablet  1 tablet Oral BID Hillary Bow, DO   1 tablet at 04/13/20 3235  . simvastatin (ZOCOR) tablet 10 mg  10 mg Oral Daily Lyda Perone M, DO   10 mg at 04/13/20 5732  . venlafaxine XR (EFFEXOR-XR) 24 hr capsule 150 mg  150 mg Oral Daily Lyda Perone M, DO   150 mg at 04/13/20 2025     Discharge Medications: Please see discharge summary for a list of discharge medications.  Relevant Imaging Results:  Relevant Lab Results:   Additional Information SSN [id number]  Lorri Frederick, LCSW

## 2020-04-13 NOTE — Progress Notes (Signed)
[facility]                                                                              Patient Demographics  Kathleen Jensen, is a 69 y.o. female, DOB - 04-Dec-1950, MVH:[id number]  Admit date - 04/11/2020   Admitting Physician Hillary Bow, DO  Outpatient Primary MD for the patient is Patient, No Pcp Per  Outpatient specialists:   LOS - 1  days   Medical records reviewed and are as summarized below:    Chief Complaint  Patient presents with  . Nausea  . Fatigue       Brief summary   Patient is a 69 year old female with history of prior stroke, residual aphasia, right-sided hemiplegia, right-sided hemineglect, history of seizure disorder on multiple seizure medications.  Patient had admission to [facility] at the end of May for breakthrough seizures, followed by [facility] neurology..  Patient presented from [facility] for new onset altered mental status.  Patient was outside with family and came back inside acting differently.  Unable to contribute much to history due to severe aphasia. UA positive for UTI  Assessment & Plan    Principal Problem:   Acute metabolic encephalopathy -Likely delirium due to UTI, breakthrough seizures, Dilantin toxicity -Neurology consulted. -Mental status improving today, appears close to her baseline  Active Problems: Acute E. coli lower UTI -Urine culture and sensitivities reviewed, urine culture positive for E. Coli -DC IV Rocephin, placed on Keflex  Seizure disorder -Neurology following dose -EEG showed mild to moderate diffuse encephalopathy, nonspecific -Continue Keppra, Vimpat, seizure precautions  Dilantin toxicity -Dilantin level 31 at the time of admission, corrected to 21.7 today -Will resume Dilantin at a lower dose in a.m.  History of prior stroke with severe aphasia, right hemiplegia, expressive aphasia -No acute neurological deficits, neuro following -Continue BP control, simvastatin     Essential hypertension -Continue losartan, HCTZ   Hypothyroidism -Continue Synthroid  Obesity Estimated body mass index is 32.66 kg/m as calculated from the following:   Height as of this encounter: 5\' 4"  (1.626 m).   Weight as of this encounter: 86.3 kg.  Code Status: Full code DVT Prophylaxis:  Lovenox  Family Communication: Discussed all imaging results, lab results, explained to the patient, no family member at the bedside   Disposition Plan:     Status is: Inpatient telemetry  Inpatient status due to acute metabolic encephalopathy secondary to acute UTI.  Dilantin level still slightly above the therapeutic range. Narrow antibiotics today, resume lower dose of Dilantin level if within normal range tomorrow morning  Dispo: The patient is from: SNF              Anticipated d/c is to: SNF              Anticipated d/c date is: 2 days              Patient currently is not medically stable to d/c.      Time Spent in minutes 25 minutes  Procedures:  EEG  Consultants:   Neurology  Antimicrobials:   Anti-infectives (From admission, onward)   Start  Dose/Rate Route Frequency Ordered Stop   04/12/20 2200  cefTRIAXone (ROCEPHIN) 1 g in sodium chloride 0.9 % 100 mL IVPB        1 g 200 mL/hr over 30 Minutes Intravenous Every 24 hours 04/12/20 0049     04/12/20 0030  cefTRIAXone (ROCEPHIN) 1 g in sodium chloride 0.9 % 100 mL IVPB        1 g 200 mL/hr over 30 Minutes Intravenous  Once 04/12/20 0020 04/12/20 0737         Medications  Scheduled Meds: . B-complex with vitamin C  1 tablet Oral Daily  . baclofen  5 mg Oral BID  . carbamazepine  200 mg Oral BID  . enoxaparin (LOVENOX) injection  40 mg Subcutaneous Daily  . gabapentin  300 mg Oral BID  . losartan  50 mg Oral Daily   And  . hydrochlorothiazide  12.5 mg Oral Daily  . [START ON 04/14/2020] influenza vaccine adjuvanted  0.5 mL Intramuscular Tomorrow-1000  . levETIRAcetam  2,000 mg Oral BID  .  levothyroxine  75 mcg Oral QAC breakfast  . magnesium oxide  400 mg Oral BID  . polycarbophil  1,250 mg Oral Daily  . polyethylene glycol  17 g Oral Daily  . senna-docusate  1 tablet Oral BID  . simvastatin  10 mg Oral Daily  . venlafaxine XR  150 mg Oral Daily   Continuous Infusions: . cefTRIAXone (ROCEPHIN)  IV 1 g (04/13/20 0150)   PRN Meds:.acetaminophen **OR** acetaminophen, meclizine, ondansetron **OR** ondansetron (ZOFRAN) IV      Subjective:   Kathleen Jensen was seen and examined today.  Much more alert and awake, appears close to her baseline.  No acute events overnight, no repeat seizures.  Has residual right-sided weakness due to prior stroke.   Objective:   Vitals:   04/12/20 2243 04/12/20 2250 04/12/20 2338 04/13/20 0749  BP:  138/88 114/64 128/75  Pulse:  (!) 59 61 60  Resp:  17 18 17   Temp: 98.2 F (36.8 C) 97.8 F (36.6 C) 98 F (36.7 C) 97.7 F (36.5 C)  TempSrc: Oral Oral Oral   SpO2:  99% 97% 100%  Weight:   86.3 kg   Height:   5\' 4"  (1.626 m)     Intake/Output Summary (Last 24 hours) at 04/13/2020 1247 Last data filed at 04/12/2020 2340 Gross per 24 hour  Intake --  Output 450 ml  Net -450 ml     Wt Readings from Last 3 Encounters:  04/12/20 86.3 kg  11/15/19 112.5 kg  07/30/18 98.6 kg    Physical Exam  General: Alert and oriented x self and place  Cardiovascular: S1 S2 clear, RRR. No pedal edema b/l  Respiratory: CTAB, no wheezing, rales or rhonchi  Gastrointestinal: Soft, nontender, nondistended, NBS  Ext: no pedal edema bilaterally  Neuro: right-sided hemiplegia  Musculoskeletal: No cyanosis, clubbing  Skin: No rashes  Psych: appears close to her baseline   Data Reviewed:  I have personally reviewed following labs and imaging studies  Micro Results Recent Results (from the past 240 hour(s))  SARS Coronavirus 2 by RT PCR (hospital order, performed in [facility] hospital lab) Nasopharyngeal Nasopharyngeal Swab      Status: None   Collection Time: 04/11/20 11:55 PM   Specimen: Nasopharyngeal Swab  Result Value Ref Range Status   SARS Coronavirus 2 NEGATIVE NEGATIVE Final    Comment: (NOTE) SARS-CoV-2 target nucleic acids are NOT DETECTED.  The SARS-CoV-2 RNA is generally detectable  in upper and lower respiratory specimens during the acute phase of infection. The lowest concentration of SARS-CoV-2 viral copies this assay can detect is 250 copies / mL. A negative result does not preclude SARS-CoV-2 infection and should not be used as the sole basis for treatment or other patient management decisions.  A negative result may occur with improper specimen collection / handling, submission of specimen other than nasopharyngeal swab, presence of viral mutation(s) within the areas targeted by this assay, and inadequate number of viral copies (<250 copies / mL). A negative result must be combined with clinical observations, patient history, and epidemiological information.  Fact Sheet for Patients:   BoilerBrush.com.cy  Fact Sheet for Healthcare Providers: https://pope.com/  This test is not yet approved or  cleared by the [place] FDA and has been authorized for detection and/or diagnosis of SARS-CoV-2 by FDA under an Emergency Use Authorization (EUA).  This EUA will remain in effect (meaning this test can be used) for the duration of the COVID-19 declaration under Section 564(b)(1) of the Act, 21 U.S.C. section 360bbb-3(b)(1), unless the authorization is terminated or revoked sooner.  Performed at [facility], [address], [place], [place] [address]   Urine Culture     Status: Abnormal   Collection Time: 04/12/20 12:21 AM   Specimen: Urine, Random  Result Value Ref Range Status   Specimen Description URINE, RANDOM  Final   Special Requests   Final    NONE Performed at [facility], [address] [place], [place] [address]     Culture 90,000 COLONIES/mL ESCHERICHIA COLI (A)  Final   Report Status 04/13/2020 FINAL  Final   Organism ID, Bacteria ESCHERICHIA COLI (A)  Final      Susceptibility   Escherichia coli - MIC*    AMPICILLIN 16 INTERMEDIATE Intermediate     CEFAZOLIN <=4 SENSITIVE Sensitive     CEFTRIAXONE <=0.25 SENSITIVE Sensitive     CIPROFLOXACIN <=0.25 SENSITIVE Sensitive     GENTAMICIN <=1 SENSITIVE Sensitive     IMIPENEM <=0.25 SENSITIVE Sensitive     NITROFURANTOIN <=16 SENSITIVE Sensitive     TRIMETH/SULFA <=20 SENSITIVE Sensitive     AMPICILLIN/SULBACTAM <=2 SENSITIVE Sensitive     PIP/TAZO <=4 SENSITIVE Sensitive     * 90,000 COLONIES/mL ESCHERICHIA COLI    Radiology Reports EEG  Result Date: 04/12/2020 Kathleen Quest, MD     04/12/2020 11:08 AM Patient Name: Kathleen Jensen MRN: [id number] Epilepsy Attending: Charlsie Jensen Referring Physician/Provider: Dr Georgiana Spinner Aroor Date: 04/12/2020 Duration: 23.26 mins Patient history: 69 y.o. 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 EEG to evaluate for seizure. Level of alertness: Awake AEDs during EEG study: CBZ, LEV, GBP Technical aspects: This EEG study was done with scalp electrodes positioned according to the 10-20 International system of electrode placement. Electrical activity was acquired at a sampling rate of  and reviewed with a high frequency filter of  and a low frequency filter of . EEG data were recorded continuously and digitally stored. Description: The posterior dominant rhythm consists of 8-9 Hz activity of moderate voltage (25-35 uV) seen predominantly in posterior head regions, symmetric and reactive to eye opening and eye closing. EEG showed continuous generalized 3 to 6 Hz theta-delta slowing. Per eeg tech annotation,  patient had left arm shaking ( difficult to visualize on video).  Concomitant eeg before, during and after the event didn't show any eeg change to suggest seizure. Hyperventilation and photic stimulation were not performed.   ABNORMALITY - Continuous slow, generalized IMPRESSION: This study is suggestive of mild to moderate diffuse encephalopathy, nonspecific etiology. No seizures or epileptiform discharges were seen throughout the recording. Per eeg tech annotation, patient had left arm shaking ( difficult to visualize on video) without concomitant eeg change. However, focal motor seizures may not be seen on scalp eeg. Therefore, clinical correlation is recommended. Kathleen QuestPriyanka O Jensen   DG Chest 1 View  Result Date: 04/12/2020 CLINICAL DATA:  Pre MRI screening. EXAM: CHEST  1 VIEW COMPARISON:  August 06, 2018 FINDINGS: There is a dual lead AICD. Mildly decreased lung volumes are seen which is likely, in part, secondary to suboptimal patient inspiration. There is no evidence of acute infiltrate, pleural effusion or pneumothorax. The heart size and mediastinal contours are within normal limits. There is mild calcification of the aortic arch. The visualized skeletal structures are unremarkable. Radiopaque surgical clips are seen overlying the right upper quadrant, with an additional radiopaque surgical clip seen within the soft tissues of the neck on the left. No additional radiopaque foreign bodies are identified. IMPRESSION: 1. Dual lead AICD. 2. No acute or active cardiopulmonary disease. Electronically Signed   By: Kathleen Jensen  Houston M.D.   On: 04/12/2020 00:54   DG Abd 1 View  Result Date: 04/12/2020 CLINICAL DATA:  Pre MRI screening. EXAM: ABDOMEN - 1 VIEW COMPARISON:  None. FINDINGS: A large amount of stool is seen throughout diffusely prominent loops of large bowel. A paucity of small bowel gas is noted. There is no evidence of free air. A radiopaque surgical clip is seen overlying the right upper  quadrant. Bilateral radiopaque pedicle screws are seen at the levels of L5 and S1. No radio-opaque calculi or other significant radiographic abnormality are seen. IMPRESSION: 1. Large stool burden with diffusely prominent large bowel. While this may be chronic in nature, sequelae associated with a distal large bowel obstruction cannot be excluded. 2. Findings consistent with prior cholecystectomy. Electronically Signed   By: Kathleen Jensen  Houston M.D.   On: 04/12/2020 00:56   CT Head Wo Contrast  Result Date: 04/11/2020 CLINICAL DATA:  Altered mental status EXAM: CT HEAD WITHOUT CONTRAST TECHNIQUE: Contiguous axial images were obtained from the base of the skull through the vertex without intravenous contrast. COMPARISON:  June 30, 2019 FINDINGS: Brain: There is stable mild diffuse atrophy. There is no intracranial mass, hemorrhage, extra-axial fluid collection, or midline shift. There is evidence of a prior infarct in the superior posterior left parietal lobe, stable. There is patchy small vessel disease throughout the centra semiovale bilaterally. There is evidence of a prior small infarct in the right mid thalamus. There is also a prior infarct in the posterior left thalamus. No acute infarct is demonstrable on this study. Vascular: There is no hyperdense vessel. There is calcification in each carotid siphon region. Skull: Bony calvarium appears intact. Sinuses/Orbits: There is a small retention cyst in the medial left maxillary antrum. There is mucosal thickening in several ethmoid air cells. Orbits appear symmetric bilaterally. Other: Mastoid air cells are clear. IMPRESSION: Stable atrophy with periventricular small vessel disease. Prior infarct in the posterosuperior left parietal lobe. Prior infarcts in each thalamus noted. No acute infarct. No mass or hemorrhage. There are foci of arterial vascular calcification. Areas of mild paranasal sinus disease noted. Electronically Signed   By: Bretta BangWilliam  Woodruff  III M.D.  On: 04/11/2020 21:15   DG Hip Unilat W or Wo Pelvis 2-3 Views Right  Result Date: 04/12/2020 CLINICAL DATA:  Evaluate right-sided femoral shortening EXAM: DG HIP (WITH OR WITHOUT PELVIS) 2-3V RIGHT COMPARISON:  None. FINDINGS: There is no evidence of an acute hip fracture or dislocation. Mild degenerative changes seen without evidence of other focal bone abnormality. Bilateral radiopaque pedicle screws are seen within the visualized portion of the lower lumbar spine. IMPRESSION: Degenerative and postoperative changes, without evidence of acute osseous abnormality. Electronically Signed   By: Kathleen Candela M.D.   On: 04/12/2020 00:41    Lab Data:  CBC: Recent Labs  Lab 04/11/20 2138 04/12/20 0932  WBC 7.5 7.1  NEUTROABS 4.8 3.7  HGB 11.8* 11.5*  HCT 37.6 37.3  MCV 87.6 91.6  PLT 214 205   Basic Metabolic Panel: Recent Labs  Lab 04/11/20 2138 04/12/20 0745  NA 131* 134*  K 4.3 3.7  CL 97* 99  CO2 25 25  GLUCOSE 118* 85  BUN 16 11  CREATININE 0.75 0.68  CALCIUM 8.2* 8.6*   GFR: Estimated Creatinine Clearance: 70.5 mL/min (by C-G formula based on SCr of 0.68 mg/dL). Liver Function Tests: Recent Labs  Lab 04/11/20 2138  AST 48*  ALT 28  ALKPHOS 98  BILITOT 0.5  PROT 7.4  ALBUMIN 3.4*   No results for input(s): LIPASE, AMYLASE in the last 168 hours. Recent Labs  Lab 04/12/20 0014  AMMONIA 20   Coagulation Profile: Recent Labs  Lab 04/11/20 2138  INR 1.1   Cardiac Enzymes: No results for input(s): CKTOTAL, CKMB, CKMBINDEX, TROPONINI in the last 168 hours. BNP (last 3 results) No results for input(s): PROBNP in the last 8760 hours. HbA1C: No results for input(s): HGBA1C in the last 72 hours. CBG: No results for input(s): GLUCAP in the last 168 hours. Lipid Profile: No results for input(s): CHOL, HDL, LDLCALC, TRIG, CHOLHDL, LDLDIRECT in the last 72 hours. Thyroid Function Tests: Recent Labs    04/12/20 0113  TSH 0.597   Anemia  Panel: No results for input(s): VITAMINB12, FOLATE, FERRITIN, TIBC, IRON, RETICCTPCT in the last 72 hours. Urine analysis:    Component Value Date/Time   COLORURINE AMBER (A) 04/11/2020 2330   APPEARANCEUR CLOUDY (A) 04/11/2020 2330   LABSPEC 1.014 04/11/2020 2330   PHURINE 5.0 04/11/2020 2330   GLUCOSEU NEGATIVE 04/11/2020 2330   HGBUR NEGATIVE 04/11/2020 2330   BILIRUBINUR NEGATIVE 04/11/2020 2330   BILIRUBINUR 1+ 11/19/2016 1307   KETONESUR NEGATIVE 04/11/2020 2330   PROTEINUR NEGATIVE 04/11/2020 2330   UROBILINOGEN 1.0 11/19/2016 1307   NITRITE POSITIVE (A) 04/11/2020 2330   LEUKOCYTESUR LARGE (A) 04/11/2020 2330     Atara Paterson M.D. [facility] 04/13/2020, 12:47 PM   Call night coverage person covering after 7pm

## 2020-04-13 NOTE — Progress Notes (Signed)
NEURO HOSPITALIST PROGRESS NOTE   Subjective: Patient alert and sitting in bed. She has baseline aphasia. She follows basic commands inconsistently and she answers some questions appropriately. She wanted to talk about her 3 daughters and that she wants them to visit more often. It seems at least one daughter does not visit her very often and she did not think that was right, she said she was always a good mother. She was very pleasant and cooperative throughout the visit.   Exam: Vitals:   04/12/20 2338 04/13/20 0749  BP: 114/64 128/75  Pulse: 61 60  Resp: 18 17  Temp: 98 F (36.7 C) 97.7 F (36.5 C)  SpO2: 97% 100%    Physical Exam  Constitutional: Appears well-developed and well-nourished.  Psych: Affect appropriate to situation Eyes: Normal external eye and conjunctiva. HENT: Normocephalic, no lesions, without obvious abnormality.   Musculoskeletal-no joint tenderness, deformity or swelling Cardiovascular: Normal rate and regular rhythm.  Respiratory: Effort normal, non-labored breathing saturations WNL GI: Soft.  No distension. There is no tenderness.  Skin: WDI   Neuro Exam  Mental Status: Alert, not able to answer orientation questions, when asked she continued to talk about her daughters. Fluent aphasia with occassional word finding diccifulty. Able to follow 1 step commands. Cranial Nerves: II: Visual fields grossly normal  III,IV, VI: ptosis not present, extra-ocular motions intact bilaterally pupils equal, round, reactive to light and accommodation V,VII: smile symmetric, facial light touch sensation normal bilaterally VIII: hearing normal bilaterally IX,X: uvula rises symmetrically XII: midline tongue extension Motor: No movement to the right arm or leg Right arm and leg 5/5 Tone and bulk:normal tone throughout; no atrophy noted Sensory: light touch decreased. She said she did not feel any touch on the left arm or leg.  Cerebellar: normal  finger to nose on the right   Medications: I have reviewed the patient's current medications.  Pertinent Labs/Diagnostics:   EEG  Result Date: 04/12/2020 Charlsie Quest, MD     04/12/2020 11:08 AM Patient Name: Kathleen Jensen MRN: [id number] Epilepsy Attending: Charlsie Quest Referring Physician/Provider: Dr Georgiana Spinner Aroor Date: 04/12/2020 Duration: 23.26 mins Patient history: 69 y.o. 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 EEG to evaluate for seizure. Level of alertness: Awake AEDs during EEG study: CBZ, LEV, GBP Technical aspects: This EEG study was done with scalp electrodes positioned according to the 10-20 International system of electrode placement. Electrical activity was acquired at a sampling rate of 500Hz  and reviewed with a high frequency filter of 70Hz  and a low frequency filter of 1Hz . EEG data were recorded continuously and digitally stored. Description: The posterior dominant rhythm consists of 8-9 Hz activity of moderate voltage (25-35 uV) seen predominantly in posterior head regions, symmetric and reactive to eye opening and eye closing. EEG showed continuous generalized 3 to 6 Hz theta-delta slowing. Per eeg tech annotation, patient had left arm shaking ( difficult to visualize on video). Concomitant eeg before, during and after the event didn't show any eeg change to suggest seizure. Hyperventilation and photic stimulation were not performed.   ABNORMALITY - Continuous slow, generalized IMPRESSION: This study is suggestive of mild to moderate diffuse encephalopathy, nonspecific etiology. No seizures or epileptiform discharges were seen throughout the recording.  Per eeg tech annotation, patient had left arm shaking ( difficult to visualize on video) without concomitant eeg change.  However, focal motor seizures may not be seen on scalp eeg. Therefore, clinical correlation is recommended. Charlsie Quest   DG Chest 1 View  Result Date: 04/12/2020 CLINICAL DATA:  Pre MRI screening. EXAM: CHEST  1 VIEW COMPARISON:  August 06, 2018 FINDINGS: There is a dual lead AICD. Mildly decreased lung volumes are seen which is likely, in part, secondary to suboptimal patient inspiration. There is no evidence of acute infiltrate, pleural effusion or pneumothorax. The heart size and mediastinal contours are within normal limits. There is mild calcification of the aortic arch. The visualized skeletal structures are unremarkable. Radiopaque surgical clips are seen overlying the right upper quadrant, with an additional radiopaque surgical clip seen within the soft tissues of the neck on the left. No additional radiopaque foreign bodies are identified. IMPRESSION: 1. Dual lead AICD. 2. No acute or active cardiopulmonary disease. Electronically Signed   By: Aram Candela M.D.   On: 04/12/2020 00:54   DG Abd 1 View  Result Date: 04/12/2020 CLINICAL DATA:  Pre MRI screening. EXAM: ABDOMEN - 1 VIEW COMPARISON:  None. FINDINGS: A large amount of stool is seen throughout diffusely prominent loops of large bowel. A paucity of small bowel gas is noted. There is no evidence of free air. A radiopaque surgical clip is seen overlying the right upper quadrant. Bilateral radiopaque pedicle screws are seen at the levels of L5 and S1. No radio-opaque calculi or other significant radiographic abnormality are seen. IMPRESSION: 1. Large stool burden with diffusely prominent large bowel. While this may be chronic in nature, sequelae associated with a distal large bowel obstruction cannot be excluded. 2. Findings consistent with prior cholecystectomy. Electronically Signed   By: Aram Candela M.D.   On: 04/12/2020 00:56   CT Head Wo Contrast  Result Date: 04/11/2020 CLINICAL DATA:  Altered mental status EXAM: CT  HEAD WITHOUT CONTRAST TECHNIQUE: Contiguous axial images were obtained from the base of the skull through the vertex without intravenous contrast. COMPARISON:  June 30, 2019 FINDINGS: Brain: There is stable mild diffuse atrophy. There is no intracranial mass, hemorrhage, extra-axial fluid collection, or midline shift. There is evidence of a prior infarct in the superior posterior left parietal lobe, stable. There is patchy small vessel disease throughout the centra semiovale bilaterally. There is evidence of a prior small infarct in the right mid thalamus. There is also a prior infarct in the posterior left thalamus. No acute infarct is demonstrable on this study. Vascular: There is no hyperdense vessel. There is calcification in each carotid siphon region. Skull: Bony calvarium appears intact. Sinuses/Orbits: There is a small retention cyst in the medial left maxillary antrum. There is mucosal thickening in several ethmoid air cells. Orbits appear symmetric bilaterally. Other: Mastoid air cells are clear. IMPRESSION: Stable atrophy with periventricular small vessel disease. Prior infarct in the posterosuperior left parietal lobe. Prior infarcts in each thalamus noted. No acute infarct. No mass or hemorrhage. There are foci of arterial vascular calcification. Areas of mild paranasal sinus disease noted. Electronically Signed   By: Bretta Bang III M.D.   On: 04/11/2020 21:15   DG Hip Unilat W or Wo Pelvis 2-3 Views Right  Result Date: 04/12/2020 CLINICAL DATA:  Evaluate right-sided femoral shortening EXAM: DG HIP (WITH OR WITHOUT PELVIS) 2-3V RIGHT COMPARISON:  None. FINDINGS: There is no evidence of an acute hip fracture or dislocation. Mild degenerative  changes seen without evidence of other focal bone abnormality. Bilateral radiopaque pedicle screws are seen within the visualized portion of the lower lumbar spine. IMPRESSION: Degenerative and postoperative changes, without evidence of acute osseous  abnormality. Electronically Signed   By: Aram Candela M.D.   On: 04/12/2020 00:41   Assessment:  69 y.o. 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  EEG found no seizure activity however focal nonconvulsive seizure with postictal state in the setting of urinary tract infection causing recrudescence of old stroke symptoms felt most likely.   Impression: Seizure in the setting of UTI  Recommendations:   Treat urinary tract infection  Dilantin does per pharmacy recommendations of 200mg  PO BID  Continue keppra, carbamazapine and gabapentin at home doses   Seizure precautions  Follow-up with outpatient neurology  Neurology signing off  DNP, FNP-C  [facility] Nurse Practitioner  Note review to follow from attending neurologist   04/13/2020, 2:21 PM

## 2020-04-13 NOTE — TOC Initial Note (Addendum)
Transition of Care [facility]) - Initial/Assessment Note    Patient Details  Name: Kathleen Jensen MRN: [id number] Date of Birth: 1950/11/26  Transition of Care [facility]) CM/SW Contact:    Lorri Frederick, LCSW Phone Number: 04/13/2020, 2:03 PM  Clinical Narrative:   Pt unable to provide information, CSW spoke with daughter, Elzie Rings.  Pt is from [place], [place] not happy with the care provided there and is considering moving her mother.  Discussed that medicare.gov website is good source of information on other options.  Pt is vaccinated.    Informed daughter that pt most likely ready to discharge tomorrow.  CSW also spoke to [facility] at [place] and informed her that pt most likely returning tomorrow.   Pt had covid test  9/14-Kenisha said no additional test is needed.            Expected Discharge Plan: Skilled Nursing Facility Barriers to Discharge: Continued Medical Work up   Patient Goals and CMS Choice        Expected Discharge Plan and Services Expected Discharge Plan: Skilled Nursing Facility     Post Acute Care Choice: Nursing Home Living arrangements for the past 2 months: Skilled Nursing Facility                                      Prior Living Arrangements/Services Living arrangements for the past 2 months: Skilled Nursing Facility Lives with:: Facility Resident Patient language and need for interpreter reviewed:: Yes        Need for Family Participation in Patient Care: Yes (Comment) Care giver support system in place?: Yes (comment) (daughter)   Criminal Activity/Legal Involvement Pertinent to Current Situation/Hospitalization: No - Comment as needed  Activities of Daily Living Home Assistive Devices/Equipment: Other (Comment) (lives at [facility] ) ADL Screening (condition at time of admission) Patient's cognitive ability adequate to safely complete daily activities?: No Is the patient deaf or have difficulty hearing?: No Does the  patient have difficulty seeing, even when wearing glasses/contacts?: No Does the patient have difficulty concentrating, remembering, or making decisions?: Yes Patient able to express need for assistance with ADLs?: No Does the patient have difficulty dressing or bathing?: Yes Independently performs ADLs?: No Communication: Dependent Is this a change from baseline?: Pre-admission baseline Dressing (OT): Dependent Is this a change from baseline?: Pre-admission baseline Grooming: Dependent Is this a change from baseline?: Pre-admission baseline Feeding: Needs assistance Is this a change from baseline?: Pre-admission baseline Bathing: Dependent Is this a change from baseline?: Pre-admission baseline In/Out Bed: Dependent Is this a change from baseline?: Pre-admission baseline Walks in Home: Dependent Is this a change from baseline?: Pre-admission baseline Does the patient have difficulty walking or climbing stairs?: Yes Weakness of Legs: Both  Permission Sought/Granted Permission sought to share information with : Family Supports Permission granted to share information with : Yes, Verbal Permission Granted  Share Information with NAME: Shara Blazing, daughter           Emotional Assessment Appearance:: Appears stated age Attitude/Demeanor/Rapport: Inconsistent, Engaged Affect (typically observed): Calm Orientation: : Oriented to Self Alcohol / Substance Use: Not Applicable Psych Involvement: No (comment)  Admission diagnosis:  Seizure (HCC) [R56.9] Encounter for imaging to screen for metal prior to MRI [Z13.89] Acute encephalopathy [G93.40] Patient Active Problem List   Diagnosis Date Noted  . Right hemiplegia (HCC) 04/12/2020  . Acute encephalopathy 04/12/2020  . Acute lower UTI 04/12/2020  .  Seizure (HCC) 04/12/2020  . 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 Hyperlipidemia associated with type 2 diabetes mellitus (HCC) 07/30/2018   . History of cerebral infarction 07/30/2018  . Vitamin B 12 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/30/2018  . Asthma 03/13/2018  . Seizures (HCC) 01/12/2018  . CVA (cerebral vascular accident) (HCC) 12/08/2017  . Abnormal thyroid blood test 12/08/2017  . Pulmonary embolism and infarction (HCC) 12/08/2017  . Anemia 12/08/2017  . Expressive aphasia 10/15/2017  . Seizure disorder (HCC) 10/15/2017  . Dysphagia 10/15/2017  . Diabetes mellitus without complication (HCC)   . Hyperlipidemia 09/22/2017  . Urinary frequency 09/22/2017  . Pre-operative clearance 11/19/2016  . Airway hyperreactivity 01/26/2016  . Disease of thyroid gland 01/26/2016  . 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 Other specified postprocedural states 01/26/2016  . 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 Chronic LBP 04/10/2015  . Degeneration of intervertebral disc of lumbar region 04/10/2015  . Spondylolisthesis of lumbar region 04/10/2015  . 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  . History of cardiac pacemaker in situ 01/09/2015  . B12 DEFICIENCY 08/15/2008  . UNSPECIFIED VITAMIN D DEFICIENCY 08/15/2008  . SINUS BRADYCARDIA 08/15/2008  . OBSTRUCTIVE SLEEP APNEA 08/11/2008  . INSOMNIA 04/28/2008  . Hyperlipidemia LDL goal <70 12/12/2006  . RHINITIS, ALLERGIC NEC 12/12/2006  . Hypothyroidism 12/11/2006  . Essential hypertension 12/11/2006  . GERD 12/11/2006  . STRESS INCONTINENCE 12/11/2006   PCP:  Patient, No Pcp Per Pharmacy:   [facility] - [place], [place] - [address] [place] [address] Phone: [phone] Fax: [phone]  [facility] - [place], [place] - [address] [place] [address] Phone: [phone] Fax: [phone]     Social Determinants of Health (SDOH) Interventions    Readmission Risk Interventions No flowsheet data found.

## 2020-04-14 LAB — COMPREHENSIVE METABOLIC PANEL
ALT: 26 U/L (ref 0–44)
AST: 31 U/L (ref 15–41)
Albumin: 3 g/dL — ABNORMAL LOW (ref 3.5–5.0)
Alkaline Phosphatase: 93 U/L (ref 38–126)
Anion gap: 7 (ref 5–15)
BUN: 13 mg/dL (ref 8–23)
CO2: 27 mmol/L (ref 22–32)
Calcium: 8.2 mg/dL — ABNORMAL LOW (ref 8.9–10.3)
Chloride: 99 mmol/L (ref 98–111)
Creatinine, Ser: 0.71 mg/dL (ref 0.44–1.00)
GFR calc Af Amer: >60 mL/min (ref >60)
GFR calc non Af Amer: >60 mL/min (ref >60)
Glucose, Bld: 87 mg/dL (ref 70–99)
Potassium: 3.5 mmol/L (ref 3.5–5.1)
Sodium: 133 mmol/L — ABNORMAL LOW (ref 135–145)
Total Bilirubin: 0.6 mg/dL (ref 0.3–1.2)
Total Protein: 6.8 g/dL (ref 6.5–8.1)

## 2020-04-14 MED ORDER — LEVETIRACETAM 1000 MG PO TABS: 2000.0000 mg | ORAL_TABLET | Freq: Two times a day (BID) | ORAL | Status: DC

## 2020-04-14 MED ORDER — PHENYTOIN SODIUM EXTENDED 100 MG PO CAPS
200.0000 mg | ORAL_CAPSULE | Freq: Two times a day (BID) | ORAL | Status: DC
Start: 2020-04-14 — End: 2021-06-06

## 2020-04-14 NOTE — Progress Notes (Signed)
Patient assessed.  Reviewed LPN documentation, and agree with the findings.  

## 2020-04-14 NOTE — Discharge Summary (Signed)
Physician Discharge Summary   [name] MRN: [id number] DOB/AGE: [birth year] 69 y.o.  Admit date: 04/11/2020 Discharge date: 04/14/2020  Primary Care Physician:  Patient, No Pcp Per   Recommendations for Outpatient Follow-up:  1. Follow up with PCP in 1-2 weeks 2. Please follow Dilantin level, Dilantin dose decreased to 200 mg twice daily 3. Patient needs follow-up appointment with her neurologist, Dr. Shawna Orleans at [facility] in 2 weeks   Home Health: Patient returning to skilled nursing facility Equipment/Devices:   Discharge Condition: stable  CODE STATUS: FULL Diet recommendation: Heart healthy diet   Discharge Diagnoses:   . Acute metabolic encephalopathy likely due to UTI, breakthrough seizures/Dilantin toxicity-improved . Acute E. coli lower UTI   Dilantin toxicity . 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 Essential hypertension . History of prior stroke with expressive aphasia, right hemiplegia . Hypothyroidism Obesity    Consults: Neurology   Allergies:   Allergies  Allergen Reactions  . Tape Rash  . Meperidine Hcl Nausea And Vomiting  . Sulfonamide Derivatives Itching  . Bacitracin-Polymyxin B Dermatitis    "Cloth Band-Aid only"  . Oxycodone Itching     DISCHARGE MEDICATIONS: Allergies as of 04/14/2020      Reactions   Tape Rash   Meperidine Hcl Nausea And Vomiting   Sulfonamide Derivatives Itching   Bacitracin-polymyxin B Dermatitis   "Cloth Band-Aid only"   Oxycodone Itching      Medication List    TAKE these medications   acetaminophen 500 MG tablet Commonly known as: TYLENOL Take 1,000 mg by mouth 2 (two) times daily as needed.   B Complex-C-Folic Acid Tabs Take 1 tablet by mouth daily.   Baclofen 5 MG Tabs Take 5 mg by mouth in the morning and at bedtime.   carbamazepine 200 MG tablet Commonly known as: TEGRETOL Take 200 mg by mouth in the morning and at bedtime.    gabapentin 300 MG capsule Commonly known as: NEURONTIN Take 1 capsule by mouth in the morning and at bedtime.   levETIRAcetam 1000 MG tablet Commonly known as: Keppra Take 2 tablets (2,000 mg total) by mouth 2 (two) times daily.   levothyroxine 75 MCG tablet Commonly known as: SYNTHROID Take 75 mcg by mouth daily before breakfast.   losartan-hydrochlorothiazide 50-12.5 MG tablet Commonly known as: HYZAAR Take 1 tablet by mouth daily.   magnesium oxide 400 MG tablet Commonly known as: MAG-OX Take 400 mg by mouth 2 (two) times daily.   meclizine 12.5 MG tablet Commonly known as: ANTIVERT Take 12.5 mg by mouth 3 (three) times daily as needed for dizziness.   phenytoin 100 MG ER capsule Commonly known as: DILANTIN Take 2 capsules (200 mg total) by mouth 2 (two) times daily. What changed:   how much to take  when to take this  additional instructions   polycarbophil 625 MG tablet Commonly known as: FIBERCON Take 1,250 mg by mouth daily. With 8 oz of water.   polyethylene glycol 17 g packet Commonly known as: MIRALAX / GLYCOLAX Take 17 g by mouth daily. Mix with 6 - 8 oz liquid.   senna-docusate 8.6-50 MG tablet Commonly known as: Senokot-S Take 1 tablet by mouth 2 (two) times daily.   simvastatin 10 MG tablet Commonly known as: ZOCOR Take 10 mg by mouth daily.   venlafaxine XR 75 MG 24 hr capsule Commonly known as: EFFEXOR-XR Take 150 mg by mouth daily.   Vitamin D (Ergocalciferol) 1.25 MG (50000 UNIT)  Caps capsule Commonly known as: DRISDOL Take 50,000 Units by mouth every 30 (thirty) days.        Brief H and P: For complete details please refer to admission H and P, but in brief Patient is a 69 year old female with history of prior stroke, residual aphasia, right-sided hemiplegia, right-sided hemineglect, history of seizure disorder on multiple seizure medications.  Patient had admission to [facility] at the end of May for breakthrough seizures, followed by  [facility] neurology..  Patient presented from [facility] for new onset altered mental status.  Patient was outside with family and came back inside acting differently.  Unable to contribute much to history due to severe aphasia. [facility]:  Acute metabolic encephalopathy -Likely delirium due to UTI, breakthrough seizures, Dilantin toxicity -Neurology was consulted, mental status now close to her baseline.   Acute E. coli lower UTI -Urine culture and sensitivities reviewed, urine culture positive for E. Coli -Patient has completed 3 days of IV Rocephin inpatient, does not need any further antibiotics  Seizure disorder Neurology was consulted -EEG showed mild to moderate diffuse encephalopathy, nonspecific -Continue Keppra, Tegretol, Neurontin, -Dilantin level above therapeutic range at the time of admission, dose lowered to 200 mg twice daily   Dilantin toxicity -Dilantin level 31 at the time of admission, corrected to 21.7  on 9/16 -Resumed Dilantin, decrease dose to 200 mg twice a day  History of prior stroke with severe aphasia, right hemiplegia, expressive aphasia -No new neurological deficits while inpatient -Continue BP control, simvastatin    Essential hypertension -Continue losartan, HCTZ   Hypothyroidism -Continue Synthroid  Obesity Estimated body mass index is 32.66 kg/m as calculated from the following:   Height as of this encounter: 5\' 4"  (1.626 m).   Weight as of this encounter: 86.3 kg.   Day of Discharge S: Mental status at baseline.  No fevers chills or any seizures.  BP 94/66 (BP Location: Right Arm)   Pulse 60   Temp 97.6 F (36.4 C)   Resp 16   Ht 5\' 4"  (1.626 m)   Wt 86.3 kg   SpO2 99%   BMI 32.66 kg/m   Physical Exam: General: Alert and awake oriented x3 not in any acute distress. HEENT: anicteric sclera, pupils reactive to light and accommodation CVS: S1-S2 clear no murmur rubs or  gallops Chest: clear to auscultation bilaterally, no wheezing rales or rhonchi Abdomen: soft nontender, nondistended, normal bowel sounds Extremities: no cyanosis, clubbing or edema noted bilaterally Neuro: right-sided hemiplegia, chronic    Get Medicines reviewed and adjusted: Please take all your medications with you for your next visit with your Primary MD  Please request your Primary MD to go over all hospital tests and procedure/radiological results at the follow up. Please ask your Primary MD to get all Hospital records sent to his/her office.  If you experience worsening of your admission symptoms, develop shortness of breath, life threatening emergency, suicidal or homicidal thoughts you must seek medical attention immediately by calling 911 or calling your MD immediately  if symptoms less severe.  You must read complete instructions/literature along with all the possible adverse reactions/side effects for all the Medicines you take and that have been prescribed to you. Take any new Medicines after you have completely understood and accept all the possible adverse reactions/side effects.   Do not drive when taking pain medications.   Do not take more than prescribed Pain, Sleep and Anxiety Medications  Special Instructions:  If you have smoked or chewed Tobacco  in the last 2 yrs please stop smoking, stop any regular Alcohol  and or any Recreational drug use.  Wear Seat belts while driving.  Please note  You were cared for by a hospitalist during your hospital stay. Once you are discharged, your primary care physician will handle any further medical issues. Please note that NO REFILLS for any discharge medications will be authorized once you are discharged, as it is imperative that you return to your primary care physician (or establish a relationship with a primary care physician if you do not have one) for your aftercare needs so that they can reassess your need for medications  and monitor your lab values.   The results of significant diagnostics from this hospitalization (including imaging, microbiology, ancillary and laboratory) are listed below for reference.      Procedures/Studies:  EEG  Result Date: 04/12/2020 Charlsie Quest, MD     04/12/2020 11:08 AM Patient Name: Kathleen Jensen MRN: [id number] Epilepsy Attending: Charlsie Quest Referring Physician/Provider: Dr Georgiana Spinner Aroor Date: 04/12/2020 Duration: 23.26 mins Patient history: 69 y.o. 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 EEG to evaluate for seizure. Level of alertness: Awake AEDs during EEG study: CBZ, LEV, GBP Technical aspects: This EEG study was done with scalp electrodes positioned according to the 10-20 International system of electrode placement. Electrical activity was acquired at a sampling rate of  and reviewed with a high frequency filter of  and a low frequency filter of . EEG data were recorded continuously and digitally stored. Description: The posterior dominant rhythm consists of 8-9 Hz activity of moderate voltage (25-35 uV) seen predominantly in posterior head regions, symmetric and reactive to eye opening and eye closing. EEG showed continuous generalized 3 to 6 Hz theta-delta slowing. Per eeg tech annotation, patient had left arm shaking ( difficult to visualize on video). Concomitant eeg before, during and after the event didn't show any eeg change to suggest seizure. Hyperventilation and photic stimulation were not performed.   ABNORMALITY - Continuous slow, generalized IMPRESSION: This study is suggestive of mild to moderate diffuse encephalopathy, nonspecific etiology. No seizures or epileptiform discharges were seen throughout the recording. Per eeg tech annotation,  patient had left arm shaking ( difficult to visualize on video) without concomitant eeg change. However, focal motor seizures may not be seen on scalp eeg. Therefore, clinical correlation is recommended. Charlsie Quest   DG Chest 1 View  Result Date: 04/12/2020 CLINICAL DATA:  Pre MRI screening. EXAM: CHEST  1 VIEW COMPARISON:  August 06, 2018 FINDINGS: There is a dual lead AICD. Mildly decreased lung volumes are seen which is likely, in part, secondary to suboptimal patient inspiration. There is no evidence of acute infiltrate, pleural effusion or pneumothorax. The heart size and mediastinal contours are within normal limits. There is mild calcification of the aortic arch. The visualized skeletal structures are unremarkable. Radiopaque surgical clips are seen overlying the right upper quadrant, with an additional radiopaque surgical clip seen within the soft tissues of the neck on the left. No additional radiopaque foreign bodies are identified. IMPRESSION: 1. Dual lead AICD. 2. No acute or active cardiopulmonary disease. Electronically Signed   By: Aram Candela M.D.   On: 04/12/2020 00:54   DG Abd 1 View  Result Date: 04/12/2020 CLINICAL DATA:  Pre  MRI screening. EXAM: ABDOMEN - 1 VIEW COMPARISON:  None. FINDINGS: A large amount of stool is seen throughout diffusely prominent loops of large bowel. A paucity of small bowel gas is noted. There is no evidence of free air. A radiopaque surgical clip is seen overlying the right upper quadrant. Bilateral radiopaque pedicle screws are seen at the levels of L5 and S1. No radio-opaque calculi or other significant radiographic abnormality are seen. IMPRESSION: 1. Large stool burden with diffusely prominent large bowel. While this may be chronic in nature, sequelae associated with a distal large bowel obstruction cannot be excluded. 2. Findings consistent with prior cholecystectomy. Electronically Signed   By: Aram Candela M.D.   On: 04/12/2020 00:56    CT Head Wo Contrast  Result Date: 04/11/2020 CLINICAL DATA:  Altered mental status EXAM: CT HEAD WITHOUT CONTRAST TECHNIQUE: Contiguous axial images were obtained from the base of the skull through the vertex without intravenous contrast. COMPARISON:  June 30, 2019 FINDINGS: Brain: There is stable mild diffuse atrophy. There is no intracranial mass, hemorrhage, extra-axial fluid collection, or midline shift. There is evidence of a prior infarct in the superior posterior left parietal lobe, stable. There is patchy small vessel disease throughout the centra semiovale bilaterally. There is evidence of a prior small infarct in the right mid thalamus. There is also a prior infarct in the posterior left thalamus. No acute infarct is demonstrable on this study. Vascular: There is no hyperdense vessel. There is calcification in each carotid siphon region. Skull: Bony calvarium appears intact. Sinuses/Orbits: There is a small retention cyst in the medial left maxillary antrum. There is mucosal thickening in several ethmoid air cells. Orbits appear symmetric bilaterally. Other: Mastoid air cells are clear. IMPRESSION: Stable atrophy with periventricular small vessel disease. Prior infarct in the posterosuperior left parietal lobe. Prior infarcts in each thalamus noted. No acute infarct. No mass or hemorrhage. There are foci of arterial vascular calcification. Areas of mild paranasal sinus disease noted. Electronically Signed   By: Bretta Bang III M.D.   On: 04/11/2020 21:15   DG Hip Unilat W or Wo Pelvis 2-3 Views Right  Result Date: 04/12/2020 CLINICAL DATA:  Evaluate right-sided femoral shortening EXAM: DG HIP (WITH OR WITHOUT PELVIS) 2-3V RIGHT COMPARISON:  None. FINDINGS: There is no evidence of an acute hip fracture or dislocation. Mild degenerative changes seen without evidence of other focal bone abnormality. Bilateral radiopaque pedicle screws are seen within the visualized portion of the lower  lumbar spine. IMPRESSION: Degenerative and postoperative changes, without evidence of acute osseous abnormality. Electronically Signed   By: Aram Candela M.D.   On: 04/12/2020 00:41       LAB RESULTS: Basic Metabolic Panel: Recent Labs  Lab 04/12/20 0745 04/14/20 0105  NA 134* 133*  K 3.7 3.5  CL 99 99  CO2 25 27  GLUCOSE 85 87  BUN 11 13  CREATININE 0.68 0.71  CALCIUM 8.6* 8.2*   Liver Function Tests: Recent Labs  Lab 04/11/20 2138 04/14/20 0105  AST 48* 31  ALT 28 26  ALKPHOS 98 93  BILITOT 0.5 0.6  PROT 7.4 6.8  ALBUMIN 3.4* 3.0*   No results for input(s): LIPASE, AMYLASE in the last 168 hours. Recent Labs  Lab 04/12/20 0014  AMMONIA 20   CBC: Recent Labs  Lab 04/11/20 2138 04/11/20 2138 04/12/20 0932  WBC 7.5  --  7.1  NEUTROABS 4.8   < > 3.7  HGB 11.8*  --  11.5*  HCT  37.6  --  37.3  MCV 87.6   < > 91.6  PLT 214  --  205   < > = values in this interval not displayed.   Cardiac Enzymes: No results for input(s): CKTOTAL, CKMB, CKMBINDEX, TROPONINI in the last 168 hours. BNP: Invalid input(s): POCBNP CBG: No results for input(s): GLUCAP in the last 168 hours.     Disposition and Follow-up: Discharge Instructions    Diet - low sodium heart healthy   Complete by: As directed    Increase activity slowly   Complete by: As directed        DISPOSITION: Skilled nursing facility   DISCHARGE FOLLOW-UP  Contact information for after-discharge care    Destination    [name] .   Service: Skilled Nursing Contact information: [address] [place] [id number] [phone]                   Time coordinating discharge:  35mins    Signed:   Thad Rangeripudeep Adan Beal M.D. [facility] 04/14/2020, 10:45 AM

## 2020-04-14 NOTE — TOC Transition Note (Signed)
Transition of Care [facility]) - CM/SW Discharge Note   Patient Details  Name: Raphaella Larkin MRN: [id number] Date of Birth: 25-Feb-1951  [facility] CM/SW Contact:  Lorri Frederick, LCSW Phone Number: 04/14/2020, 11:54 AM   Clinical Narrative:   Pt going to [facility], room 303A.  RN call report to [phone].  PTAR called [phone].     Final next level of care: Skilled Nursing Facility Barriers to Discharge: Barriers Resolved   Patient Goals and CMS Choice        Discharge Placement              Patient chooses bed at: [facility] (already a resident there) Patient to be transferred to facility by: PTAR Name of family member notified: [name] Patient and family notified of of transfer: 04/14/20  Discharge Plan and Services     Post Acute Care Choice: Nursing Home          DME Arranged: N/A         HH Arranged: NA          Social Determinants of Health (SDOH) Interventions     Readmission Risk Interventions No flowsheet data found.

## 2020-04-17 LAB — PHENYTOIN LEVEL, FREE AND TOTAL
Phenytoin, Free: 1.2 ug/mL (ref 1.0–2.0)
Phenytoin, Total: 13.2 ug/mL (ref 10.0–20.0)

## 2020-09-12 ENCOUNTER — Other Ambulatory Visit: Payer: Self-pay | Admitting: Family Medicine

## 2020-09-12 DIAGNOSIS — Z1231 Encounter for screening mammogram for malignant neoplasm of breast: Secondary | ICD-10-CM

## 2020-11-01 ENCOUNTER — Other Ambulatory Visit: Payer: Self-pay

## 2020-11-01 ENCOUNTER — Ambulatory Visit
Admission: RE | Admit: 2020-11-01 | Discharge: 2020-11-01 | Disposition: A | Discharge status: Home / Self Care | Payer: Medicare HMO | Source: Ambulatory Visit | Attending: Family Medicine | Admitting: Family Medicine

## 2020-11-01 DIAGNOSIS — Z1231 Encounter for screening mammogram for malignant neoplasm of breast: Secondary | ICD-10-CM

## 2021-03-15 ENCOUNTER — Other Ambulatory Visit: Payer: Self-pay | Admitting: Family Medicine

## 2021-03-15 DIAGNOSIS — R4182 Altered mental status, unspecified: Secondary | ICD-10-CM

## 2021-03-15 DIAGNOSIS — R030 Elevated blood-pressure reading, without diagnosis of hypertension: Secondary | ICD-10-CM

## 2021-03-22 ENCOUNTER — Encounter (HOSPITAL_COMMUNITY): Payer: Self-pay

## 2021-03-22 ENCOUNTER — Ambulatory Visit (HOSPITAL_COMMUNITY): Payer: Medicare HMO

## 2021-05-28 ENCOUNTER — Emergency Department (HOSPITAL_COMMUNITY): Payer: Medicare HMO

## 2021-05-28 ENCOUNTER — Other Ambulatory Visit: Payer: Self-pay

## 2021-05-28 ENCOUNTER — Inpatient Hospital Stay (HOSPITAL_COMMUNITY)
Admission: EM | Admit: 2021-05-28 | Discharge: 2021-06-06 | DRG: 392 | Disposition: A | Discharge status: Skilled Nursing Facility | Payer: Medicare HMO | Source: Skilled Nursing Facility | Attending: Internal Medicine | Admitting: Internal Medicine

## 2021-05-28 ENCOUNTER — Encounter (HOSPITAL_COMMUNITY): Payer: Self-pay | Admitting: *Deleted

## 2021-05-28 DIAGNOSIS — Z7401 Bed confinement status: Secondary | ICD-10-CM

## 2021-05-28 DIAGNOSIS — F32A Depression, unspecified: Secondary | ICD-10-CM | POA: Diagnosis present

## 2021-05-28 DIAGNOSIS — Z833 Family history of diabetes mellitus: Secondary | ICD-10-CM

## 2021-05-28 DIAGNOSIS — R569 Unspecified convulsions: Secondary | ICD-10-CM

## 2021-05-28 DIAGNOSIS — G40909 Epilepsy, unspecified, not intractable, without status epilepticus: Secondary | ICD-10-CM | POA: Diagnosis present

## 2021-05-28 DIAGNOSIS — G8191 Hemiplegia, unspecified affecting right dominant side: Secondary | ICD-10-CM

## 2021-05-28 DIAGNOSIS — Z6832 Body mass index (BMI) 32.0-32.9, adult: Secondary | ICD-10-CM

## 2021-05-28 DIAGNOSIS — E669 Obesity, unspecified: Secondary | ICD-10-CM | POA: Diagnosis present

## 2021-05-28 DIAGNOSIS — I129 Hypertensive chronic kidney disease with stage 1 through stage 4 chronic kidney disease, or unspecified chronic kidney disease: Secondary | ICD-10-CM | POA: Diagnosis present

## 2021-05-28 DIAGNOSIS — I69319 Unspecified symptoms and signs involving cognitive functions following cerebral infarction: Secondary | ICD-10-CM

## 2021-05-28 DIAGNOSIS — I639 Cerebral infarction, unspecified: Secondary | ICD-10-CM | POA: Diagnosis present

## 2021-05-28 DIAGNOSIS — I69320 Aphasia following cerebral infarction: Secondary | ICD-10-CM

## 2021-05-28 DIAGNOSIS — R109 Unspecified abdominal pain: Secondary | ICD-10-CM

## 2021-05-28 DIAGNOSIS — Z888 Allergy status to other drugs, medicaments and biological substances status: Secondary | ICD-10-CM

## 2021-05-28 DIAGNOSIS — E11649 Type 2 diabetes mellitus with hypoglycemia without coma: Secondary | ICD-10-CM | POA: Diagnosis not present

## 2021-05-28 DIAGNOSIS — Z950 Presence of cardiac pacemaker: Secondary | ICD-10-CM

## 2021-05-28 DIAGNOSIS — E785 Hyperlipidemia, unspecified: Secondary | ICD-10-CM | POA: Diagnosis present

## 2021-05-28 DIAGNOSIS — B370 Candidal stomatitis: Secondary | ICD-10-CM | POA: Diagnosis present

## 2021-05-28 DIAGNOSIS — K5900 Constipation, unspecified: Secondary | ICD-10-CM

## 2021-05-28 DIAGNOSIS — Z9884 Bariatric surgery status: Secondary | ICD-10-CM

## 2021-05-28 DIAGNOSIS — L89151 Pressure ulcer of sacral region, stage 1: Secondary | ICD-10-CM | POA: Diagnosis present

## 2021-05-28 DIAGNOSIS — Z882 Allergy status to sulfonamides status: Secondary | ICD-10-CM

## 2021-05-28 DIAGNOSIS — E1122 Type 2 diabetes mellitus with diabetic chronic kidney disease: Secondary | ICD-10-CM | POA: Diagnosis present

## 2021-05-28 DIAGNOSIS — I69351 Hemiplegia and hemiparesis following cerebral infarction affecting right dominant side: Secondary | ICD-10-CM

## 2021-05-28 DIAGNOSIS — G4733 Obstructive sleep apnea (adult) (pediatric): Secondary | ICD-10-CM | POA: Diagnosis present

## 2021-05-28 DIAGNOSIS — K5981 Ogilvie syndrome: Secondary | ICD-10-CM | POA: Diagnosis not present

## 2021-05-28 DIAGNOSIS — Z7409 Other reduced mobility: Secondary | ICD-10-CM | POA: Diagnosis present

## 2021-05-28 DIAGNOSIS — F419 Anxiety disorder, unspecified: Secondary | ICD-10-CM | POA: Diagnosis present

## 2021-05-28 DIAGNOSIS — Z79899 Other long term (current) drug therapy: Secondary | ICD-10-CM

## 2021-05-28 DIAGNOSIS — Z823 Family history of stroke: Secondary | ICD-10-CM

## 2021-05-28 DIAGNOSIS — E119 Type 2 diabetes mellitus without complications: Secondary | ICD-10-CM

## 2021-05-28 DIAGNOSIS — I69391 Dysphagia following cerebral infarction: Secondary | ICD-10-CM

## 2021-05-28 DIAGNOSIS — Z86711 Personal history of pulmonary embolism: Secondary | ICD-10-CM

## 2021-05-28 DIAGNOSIS — E039 Hypothyroidism, unspecified: Secondary | ICD-10-CM | POA: Diagnosis present

## 2021-05-28 DIAGNOSIS — K5909 Other constipation: Secondary | ICD-10-CM | POA: Diagnosis present

## 2021-05-28 DIAGNOSIS — Z885 Allergy status to narcotic agent status: Secondary | ICD-10-CM

## 2021-05-28 DIAGNOSIS — Z87891 Personal history of nicotine dependence: Secondary | ICD-10-CM

## 2021-05-28 DIAGNOSIS — E876 Hypokalemia: Secondary | ICD-10-CM | POA: Diagnosis present

## 2021-05-28 DIAGNOSIS — Z800 Family history of malignant neoplasm of digestive organs: Secondary | ICD-10-CM

## 2021-05-28 DIAGNOSIS — N182 Chronic kidney disease, stage 2 (mild): Secondary | ICD-10-CM | POA: Diagnosis present

## 2021-05-28 DIAGNOSIS — K219 Gastro-esophageal reflux disease without esophagitis: Secondary | ICD-10-CM | POA: Diagnosis present

## 2021-05-28 DIAGNOSIS — I680 Cerebral amyloid angiopathy: Secondary | ICD-10-CM | POA: Diagnosis present

## 2021-05-28 DIAGNOSIS — R1312 Dysphagia, oropharyngeal phase: Secondary | ICD-10-CM | POA: Diagnosis present

## 2021-05-28 DIAGNOSIS — I10 Essential (primary) hypertension: Secondary | ICD-10-CM | POA: Diagnosis present

## 2021-05-28 LAB — CBC
HCT: 47.3 % — ABNORMAL HIGH (ref 36.0–46.0)
Hemoglobin: 15.2 g/dL — ABNORMAL HIGH (ref 12.0–15.0)
MCH: 29.9 pg (ref 26.0–34.0)
MCHC: 32.1 g/dL (ref 30.0–36.0)
MCV: 93.1 fL (ref 80.0–100.0)
Platelets: 246 10*3/uL (ref 150–400)
RBC: 5.08 MIL/uL (ref 3.87–5.11)
RDW: 13.1 % (ref 11.5–15.5)
WBC: 10 10*3/uL (ref 4.0–10.5)
nRBC: 0 % (ref 0.0–0.2)

## 2021-05-28 LAB — COMPREHENSIVE METABOLIC PANEL
ALT: 31 U/L (ref 0–44)
AST: 49 U/L — ABNORMAL HIGH (ref 15–41)
Albumin: 3.2 g/dL — ABNORMAL LOW (ref 3.5–5.0)
Alkaline Phosphatase: 67 U/L (ref 38–126)
Anion gap: 10 (ref 5–15)
BUN: 22 mg/dL (ref 8–23)
CO2: 28 mmol/L (ref 22–32)
Calcium: 9.2 mg/dL (ref 8.9–10.3)
Chloride: 100 mmol/L (ref 98–111)
Creatinine, Ser: 0.78 mg/dL (ref 0.44–1.00)
GFR, Estimated: >60 mL/min (ref >60)
Glucose, Bld: 105 mg/dL — ABNORMAL HIGH (ref 70–99)
Potassium: 4.3 mmol/L (ref 3.5–5.1)
Sodium: 138 mmol/L (ref 135–145)
Total Bilirubin: 0.6 mg/dL (ref 0.3–1.2)
Total Protein: 8.1 g/dL (ref 6.5–8.1)

## 2021-05-28 LAB — LIPASE, BLOOD: Lipase: 46 U/L (ref 11–51)

## 2021-05-28 NOTE — ED Provider Notes (Signed)
Emergency Medicine Provider Triage Evaluation Note  Kathleen Jensen , a 70 y.o. female  was evaluated in triage.  Pt complains of abdominal pain.  Patient sent from [facility] with complaints of abdominal pain, constipation, and altered mental status x1 month.  History of past CVA with right-sided residual weakness.   Difficulty obtaining ROS due to mental status  Review of Systems  Positive: Abdominal pain Negative:   Physical Exam  BP (!) 156/107 (BP Location: Left Arm)   Pulse 84   Temp 98.8 F (37.1 C) (Oral)   Resp 16   SpO2 99%  Gen:   Awake, no distress   Resp:  Normal effort  MSK:   Moves extremities without difficulty  Other:  Alert to person, right-sided upper and lower extremity weakness (residual from prior CVA)  abdomen soft and nondistended, tenderness to palpation of right upper and lower quadrant, no guarding or rigidity  Lungs clear to auscultation, heart regular rate and rhythm  Medical Decision Making  Medically screening exam initiated at 2:49 PM.  Appropriate orders placed.  Kathleen Jensen was informed that the remainder of the evaluation will be completed by another provider, this initial triage assessment does not replace that evaluation, and the importance of remaining in the ED until their evaluation is complete.     Su Monks, PA-C 05/28/21 1451    Rozelle Logan, DO 05/29/21 0730

## 2021-05-28 NOTE — ED Notes (Signed)
Oxygen tank replaced °

## 2021-05-28 NOTE — ED Triage Notes (Signed)
Patient sent from [facility] with c/o abd. Pain and constipation and AMS x 1 month. Hx. CVA in the past with right sided weakness.

## 2021-05-29 ENCOUNTER — Emergency Department (HOSPITAL_COMMUNITY): Payer: Medicare HMO

## 2021-05-29 DIAGNOSIS — K5981 Ogilvie syndrome: Secondary | ICD-10-CM | POA: Diagnosis not present

## 2021-05-29 LAB — COMPREHENSIVE METABOLIC PANEL
ALT: 31 U/L (ref 0–44)
AST: 44 U/L — ABNORMAL HIGH (ref 15–41)
Albumin: 3.2 g/dL — ABNORMAL LOW (ref 3.5–5.0)
Alkaline Phosphatase: 60 U/L (ref 38–126)
Anion gap: 8 (ref 5–15)
BUN: 24 mg/dL — ABNORMAL HIGH (ref 8–23)
CO2: 30 mmol/L (ref 22–32)
Calcium: 8.9 mg/dL (ref 8.9–10.3)
Chloride: 99 mmol/L (ref 98–111)
Creatinine, Ser: 0.87 mg/dL (ref 0.44–1.00)
GFR, Estimated: >60 mL/min (ref >60)
Glucose, Bld: 91 mg/dL (ref 70–99)
Potassium: 4 mmol/L (ref 3.5–5.1)
Sodium: 137 mmol/L (ref 135–145)
Total Bilirubin: 0.7 mg/dL (ref 0.3–1.2)
Total Protein: 8 g/dL (ref 6.5–8.1)

## 2021-05-29 LAB — CBC WITH DIFFERENTIAL/PLATELET
Abs Immature Granulocytes: 0.03 10*3/uL (ref 0.00–0.07)
Basophils Absolute: 0.1 10*3/uL (ref 0.0–0.1)
Basophils Relative: 1 %
Eosinophils Absolute: 0 10*3/uL (ref 0.0–0.5)
Eosinophils Relative: 0 %
HCT: 43.5 % (ref 36.0–46.0)
Hemoglobin: 13.8 g/dL (ref 12.0–15.0)
Immature Granulocytes: 0 %
Lymphocytes Relative: 43 %
Lymphs Abs: 4.7 10*3/uL — ABNORMAL HIGH (ref 0.7–4.0)
MCH: 30.1 pg (ref 26.0–34.0)
MCHC: 31.7 g/dL (ref 30.0–36.0)
MCV: 95 fL (ref 80.0–100.0)
Monocytes Absolute: 1 10*3/uL (ref 0.1–1.0)
Monocytes Relative: 9 %
Neutro Abs: 5 10*3/uL (ref 1.7–7.7)
Neutrophils Relative %: 47 %
Platelets: 204 10*3/uL (ref 150–400)
RBC: 4.58 MIL/uL (ref 3.87–5.11)
RDW: 13.2 % (ref 11.5–15.5)
WBC: 10.8 10*3/uL — ABNORMAL HIGH (ref 4.0–10.5)
nRBC: 0 % (ref 0.0–0.2)

## 2021-05-29 LAB — CBG MONITORING, ED
Glucose-Capillary: 89 mg/dL (ref 70–99)
Glucose-Capillary: 69 mg/dL — ABNORMAL LOW (ref 70–99)
Glucose-Capillary: 86 mg/dL (ref 70–99)

## 2021-05-29 MED ORDER — ACETAMINOPHEN 325 MG PO TABS
650.0000 mg | ORAL_TABLET | Freq: Four times a day (QID) | ORAL | Status: DC | PRN
  Administered 2021-05-30 – 2021-06-03 (×2): 650 mg via ORAL
  Filled 2021-05-29 (×2): qty 2

## 2021-05-29 MED ORDER — DULOXETINE HCL 30 MG PO CPEP
30.0000 mg | ORAL_CAPSULE | Freq: Every day | ORAL | Status: DC
  Administered 2021-05-30 – 2021-06-06 (×8): 30 mg via ORAL
  Filled 2021-05-29 (×8): qty 1

## 2021-05-29 MED ORDER — LOSARTAN POTASSIUM-HCTZ 50-12.5 MG PO TABS: 1.0000 | ORAL_TABLET | Freq: Every day | ORAL | Status: DC

## 2021-05-29 MED ORDER — SODIUM CHLORIDE 0.9 % IV BOLUS
1000.0000 mL | Freq: Once | INTRAVENOUS | Status: AC
  Administered 2021-05-29: 1000 mL via INTRAVENOUS

## 2021-05-29 MED ORDER — ACETAMINOPHEN 650 MG RE SUPP: 650.0000 mg | Freq: Four times a day (QID) | RECTAL | Status: DC | PRN

## 2021-05-29 MED ORDER — LEVOTHYROXINE SODIUM 75 MCG PO TABS
75.0000 ug | ORAL_TABLET | Freq: Every day | ORAL | Status: DC
  Administered 2021-05-30 – 2021-06-06 (×8): 75 ug via ORAL
  Filled 2021-05-29 (×8): qty 1

## 2021-05-29 MED ORDER — BACLOFEN 5 MG HALF TABLET
5.0000 mg | ORAL_TABLET | Freq: Two times a day (BID) | ORAL | Status: DC
  Administered 2021-05-29 – 2021-06-06 (×17): 5 mg via ORAL
  Filled 2021-05-29 (×17): qty 1

## 2021-05-29 MED ORDER — INSULIN ASPART 100 UNIT/ML IJ SOLN
0.0000 [IU] | Freq: Three times a day (TID) | INTRAMUSCULAR | Status: DC
  Administered 2021-05-30 – 2021-06-04 (×2): 1 [IU] via SUBCUTANEOUS

## 2021-05-29 MED ORDER — PHENYTOIN 125 MG/5ML PO SUSP
100.0000 mg | Freq: Every day | ORAL | Status: DC
  Administered 2021-05-29 – 2021-06-06 (×9): 100 mg via ORAL
  Filled 2021-05-29 (×9): qty 4

## 2021-05-29 MED ORDER — LEVETIRACETAM 500 MG PO TABS
2000.0000 mg | ORAL_TABLET | Freq: Two times a day (BID) | ORAL | Status: DC
  Administered 2021-05-29 – 2021-06-06 (×17): 2000 mg via ORAL
  Filled 2021-05-29 (×17): qty 4

## 2021-05-29 MED ORDER — MORPHINE SULFATE (PF) 2 MG/ML IV SOLN
2.0000 mg | Freq: Once | INTRAVENOUS | Status: AC
  Administered 2021-05-29: 2 mg via INTRAVENOUS
  Filled 2021-05-29: qty 1

## 2021-05-29 MED ORDER — HYDROCHLOROTHIAZIDE 12.5 MG PO TABS
12.5000 mg | ORAL_TABLET | Freq: Every day | ORAL | Status: DC
  Administered 2021-05-29 – 2021-06-06 (×9): 12.5 mg via ORAL
  Filled 2021-05-29 (×9): qty 1

## 2021-05-29 MED ORDER — IOHEXOL 300 MG/ML  SOLN
80.0000 mL | Freq: Once | INTRAMUSCULAR | Status: AC | PRN
  Administered 2021-05-29: 80 mL via INTRAVENOUS

## 2021-05-29 MED ORDER — PHENYTOIN 125 MG/5ML PO SUSP
150.0000 mg | Freq: Two times a day (BID) | ORAL | Status: DC
  Administered 2021-05-29 – 2021-06-06 (×17): 150 mg via ORAL
  Filled 2021-05-29 (×19): qty 8

## 2021-05-29 MED ORDER — VALPROIC ACID 250 MG/5ML PO SOLN
500.0000 mg | Freq: Four times a day (QID) | ORAL | Status: DC
  Administered 2021-05-29 – 2021-06-06 (×33): 500 mg via ORAL
  Filled 2021-05-29 (×35): qty 10

## 2021-05-29 MED ORDER — ENOXAPARIN SODIUM 40 MG/0.4ML IJ SOSY
40.0000 mg | PREFILLED_SYRINGE | INTRAMUSCULAR | Status: DC
  Administered 2021-05-29 – 2021-06-06 (×9): 40 mg via SUBCUTANEOUS
  Filled 2021-05-29 (×9): qty 0.4

## 2021-05-29 MED ORDER — LIDOCAINE 4 % EX PTCH: 1.0000 | MEDICATED_PATCH | Freq: Every day | CUTANEOUS | Status: DC

## 2021-05-29 MED ORDER — PREGABALIN 50 MG PO CAPS
50.0000 mg | ORAL_CAPSULE | Freq: Every day | ORAL | Status: DC
  Administered 2021-05-29 – 2021-06-06 (×9): 50 mg via ORAL
  Filled 2021-05-29 (×9): qty 1

## 2021-05-29 MED ORDER — RENA-VITE PO TABS
1.0000 | ORAL_TABLET | Freq: Every day | ORAL | Status: DC
  Administered 2021-05-30 – 2021-06-06 (×8): 1 via ORAL
  Filled 2021-05-29 (×9): qty 1

## 2021-05-29 MED ORDER — SODIUM CHLORIDE 0.9 % IV BOLUS
500.0000 mL | Freq: Once | INTRAVENOUS | Status: AC
  Administered 2021-05-29: 500 mL via INTRAVENOUS

## 2021-05-29 MED ORDER — LOSARTAN POTASSIUM 50 MG PO TABS
50.0000 mg | ORAL_TABLET | Freq: Every day | ORAL | Status: DC
  Administered 2021-05-29 – 2021-06-06 (×9): 50 mg via ORAL
  Filled 2021-05-29 (×9): qty 1

## 2021-05-29 MED ORDER — NYSTATIN 100000 UNIT/ML MT SUSP
5.0000 mL | Freq: Four times a day (QID) | OROMUCOSAL | Status: DC
  Administered 2021-05-29 – 2021-06-06 (×33): 500000 [IU] via ORAL
  Filled 2021-05-29 (×29): qty 5

## 2021-05-29 MED ORDER — LIDOCAINE 5 % EX PTCH
1.0000 | MEDICATED_PATCH | CUTANEOUS | Status: DC
  Administered 2021-05-29 – 2021-06-06 (×8): 1 via TRANSDERMAL
  Filled 2021-05-29 (×8): qty 1

## 2021-05-29 MED ORDER — PHENYTOIN 125 MG/5ML PO SUSP
100.0000 mg | ORAL | Status: DC
Start: 2021-05-29 — End: 2021-05-29

## 2021-05-29 NOTE — ED Notes (Signed)
Family into room.

## 2021-05-29 NOTE — H&P (Signed)
History and Physical    Kathleen Jensen FHL:[id number] DOB: 05/23/1951 DOA: 05/28/2021  PCP: Donato Schultz, DO Consultants:  neurology: Dr. Tera Helper, hematology: Dr. Ottis Stain  Patient coming from: [facility]   Chief Complaint: abdominal pain and decreased PO intake.   HPI: Kathleen Jensen is a 70 y.o. 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 She has had spitting up when she tries to eat. They put her on a pureed diet because she wasn't able to eat. She has no problems with liquids, but hardly drinks anything. She states the pain in her stomach is worse at night. Unsure if she passing gas and unsure when last BM was. Family not sure when colonoscopy was. Records in chart indicate 2015 with [facility]. History mainly from family due to her aphasia.   No fever/chills, no headaches, no chest pain/palpitations, no shortness of breath, cough, leg swelling.   ED Course: vitals: Afebrile, blood pressure 156/107, heart rate 84, respiratory rate 16, oxygen 99% on 2 L nasal cannula. Pertinent labs: None Chest x-ray: No active disease.  CT abdomen pelvis: Large burden burden of stool throughout the colon and rectum, dual balls in the rectum.  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  Small air-fluid level in the urinary bladder.  Correlate for recent catheterization.  Status post Roux type gastric bypass. In ED patient made n.p.o., given 1.5 normal saline bolus. Attempted manual disimpaction.  General surgery and GI were consulted and [facility] was asked to admit.  Review of Systems: As per  HPI; otherwise review of systems reviewed and negative.   Ambulatory Status:  bed bound    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)    Hyperlipemia    Hypertension    Insomnia    Intracerebral hemorrhage, intraventricular (HCC)    Muscle weakness    Other abnormalities of gait and mobility    Seizures (HCC)    Stroke (HCC)    Unsteadiness on feet    Vitamin B deficiency     Past Surgical History:  Procedure Laterality Date   BACK SURGERY     Dec 18, 2016 Dr.Torrealba   BREAST BIOPSY Right    BREAST EXCISIONAL BIOPSY     CERVICAL SPINE SURGERY     C4   CESAREAN SECTION     GASTRIC BYPASS  03/2007   TONSILLECTOMY      Social History   Socioeconomic History   Marital status: Divorced    Spouse name: Not on file   Number of children: 3   Years of education: 43yrs   Highest education level: Not on file  Occupational History   Occupation: Retired    Associate Professor: [employer]    Comment: social services  Tobacco Use  Smoking status: Former    Years: 4.00    Types: Cigarettes    Quit date: 07/30/1979    Years since quitting: 41.8   Smokeless tobacco: Never  Vaping Use   Vaping Use: Never used  Substance and Sexual Activity   Alcohol use: No    Alcohol/week: 0.0 standard drinks   Drug use: No   Sexual activity: Never    Partners: Male  Other Topics Concern   Not on file  Social History Narrative   Patient lives at home alone.   Caffeine Use: 1 cup daily   1 dog   Social Determinants of Health   Financial Resource Strain: Not on file  Food Insecurity: Not on file   Transportation Needs: Not on file  Physical Activity: Not on file  Stress: Not on file  Social Connections: Not on file  Intimate Partner Violence: Not on file    Allergies  Allergen Reactions   Tape Rash   Meperidine Hcl Nausea And Vomiting   Sulfonamide Derivatives Itching   Bacitracin-Polymyxin B Dermatitis    "Cloth Band-Aid only"   Oxycodone Itching    Family History  Problem Relation Age of Onset   High blood pressure Mother    Diabetes Mother    Diabetes Father    Stroke Father    Cancer Sister        Colorectal cancer   Cancer Brother        colon    Prior to Admission medications   Medication Sig Start Date End Date Taking? Authorizing Provider  acetaminophen (TYLENOL) 500 MG tablet Take 1,000 mg by mouth 2 (two) times daily as needed.    [provider]  B Complex-C-Folic Acid TABS Take 1 tablet by mouth daily.    [provider]  Baclofen 5 MG TABS Take 5 mg by mouth in the morning and at bedtime.    [provider]  carbamazepine (TEGRETOL) 200 MG tablet Take 200 mg by mouth in the morning and at bedtime.    [provider]  gabapentin (NEURONTIN) 300 MG capsule Take 1 capsule by mouth in the morning and at bedtime.  01/21/18   [provider]  levETIRAcetam (KEPPRA) 1000 MG tablet Take 2 tablets (2,000 mg total) by mouth 2 (two) times daily. 04/14/20   Rai, Delene Ruffini, MD  levothyroxine (SYNTHROID) 75 MCG tablet Take 75 mcg by mouth daily before breakfast.    [provider]  losartan-hydrochlorothiazide (HYZAAR) 50-12.5 MG tablet Take 1 tablet by mouth daily.    [provider]  magnesium oxide (MAG-OX) 400 MG tablet Take 400 mg by mouth 2 (two) times daily.    [provider]  meclizine (ANTIVERT) 12.5 MG tablet Take 12.5 mg by mouth 3 (three) times daily as needed for dizziness.    [provider]  phenytoin (DILANTIN) 100 MG ER capsule Take 2 capsules (200 mg total) by mouth 2  (two) times daily. 04/14/20   Rai, Delene Ruffini, MD  polycarbophil (FIBERCON) 625 MG tablet Take 1,250 mg by mouth daily. With 8 oz of water.     [provider]  polyethylene glycol (MIRALAX / GLYCOLAX) packet Take 17 g by mouth daily. Mix with 6 - 8 oz liquid.    [provider]  senna-docusate (SENOKOT-S) 8.6-50 MG tablet Take 1 tablet by mouth 2 (two) times daily.    [provider]  simvastatin (ZOCOR) 10 MG tablet Take 10 mg by mouth daily.  [provider]  venlafaxine XR (EFFEXOR-XR) 75 MG 24 hr capsule Take 150 mg by mouth daily.    [provider]  Vitamin D, Ergocalciferol, (DRISDOL) 1.25 MG (50000 UNIT) CAPS capsule Take 50,000 Units by mouth every 30 (thirty) days.    [provider]    Physical Exam: Vitals:   05/29/21 1015 05/29/21 1030 05/29/21 1115 05/29/21 1222  BP: 106/89 125/67 131/83 132/81  Pulse: 60 (!) 58 61 60  Resp: Temp:      TempSrc:      SpO2: 100% 100% 97% 99%     General:  Appears calm and comfortable and is in NAD Eyes:  PERRL, EOMI, normal lids, iris ENT:  grossly normal hearing, lips & tongue, mmm; appropriate dentition Neck:  no LAD, masses or thyromegaly; no carotid bruits Cardiovascular:  RRR, no m/r/g. No LE edema.  Respiratory:   CTA bilaterally with no wheezes/rales/rhonchi.  Normal respiratory effort. Abdomen:  soft, NT, distended, NABS Back:   normal alignment, no CVAT Skin:  no rash or induration seen on limited exam Musculoskeletal:  right sided hemiplegia. LEFT UE with good ROM/tone, no bony abnormality Lower extremity:  No LE edema.  Limited foot exam with no ulcerations.  2+ distal pulses. Psychiatric:  grossly normal mood and affect, speech aphasic, at baseline.  AOx3 Neurologic:  CN 2-12 grossly intact, sensation intact.     Radiological Exams on Admission: Independently reviewed - see discussion in A/P where applicable  CT Head Wo Contrast  Result Date:  05/28/2021 CLINICAL DATA:  Mental status change, unknown cause EXAM: CT HEAD WITHOUT CONTRAST TECHNIQUE: Contiguous axial images were obtained from the base of the skull through the vertex without intravenous contrast. COMPARISON:  CT head 04/11/2020 BRAIN: BRAIN Patchy and confluent areas of decreased attenuation are noted throughout the deep and periventricular white matter of the cerebral hemispheres bilaterally, compatible with chronic microvascular ischemic disease. Patchy and confluent areas of decreased attenuation are noted throughout the deep and periventricular white matter of the cerebral hemispheres bilaterally, compatible with chronic microvascular ischemic disease. Left parietal encephalomalacia. No evidence of large-territorial acute infarction. No parenchymal hemorrhage. No mass lesion. No extra-axial collection. No mass effect or midline shift. No hydrocephalus. Basilar cisterns are patent. Vascular: No hyperdense vessel. Atherosclerotic calcifications are present within the cavernous internal carotid arteries. Skull: No acute fracture or focal lesion. Sinuses/Orbits: Paranasal sinuses and mastoid air cells are clear. The orbits are unremarkable. Other: None. IMPRESSION: No acute intracranial abnormality. Electronically Signed   By: Tish Frederickson M.D.   On: 05/28/2021 19:16   CT ABDOMEN PELVIS W CONTRAST  Result Date: 05/29/2021 CLINICAL DATA:  Abdominal pain, constipation, altered mental status EXAM: CT ABDOMEN AND PELVIS WITH CONTRAST TECHNIQUE: Multidetector CT imaging of the abdomen and pelvis was performed using the standard protocol following bolus administration of intravenous contrast. CONTRAST:  80mL OMNIPAQUE IOHEXOL 300 MG/ML  SOLN COMPARISON:  08/06/2018 FINDINGS: Lower chest: No acute abnormality. Hepatobiliary: No focal liver abnormality is seen. Status post cholecystectomy. Mild postoperative biliary dilatation. Pancreas: Unremarkable. No pancreatic ductal dilatation or  surrounding inflammatory changes. Spleen: Normal in size without significant abnormality. Adrenals/Urinary Tract: Adrenal glands are unremarkable. Kidneys are normal, without renal calculi, solid lesion, or hydronephrosis. Small air-fluid level in the urinary bladder. Stomach/Bowel: Status post Roux type gastric bypass. Appendix appears normal. No evidence of bowel wall thickening, distention, or inflammatory changes. Large burden of stool throughout the colon and rectum, with stool balls in the rectum.  The patulous sigmoid colon is mildly distended, measuring up to 7.0 cm. Vascular/Lymphatic: Aortic atherosclerosis. No enlarged abdominal or pelvic lymph nodes. Reproductive: Calcified uterine fibroids. Other: No abdominal wall hernia or abnormality. No abdominopelvic ascites. Musculoskeletal: No acute or significant osseous findings. IMPRESSION: 1. Large burden of stool throughout the colon and rectum, with stool balls in the rectum. 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 2. Small air-fluid level in the urinary bladder. Correlate for recent catheterization. 3. Status post Roux type gastric bypass. 4. Uterine fibroids. Aortic Atherosclerosis (ICD10-I70.0). Electronically Signed   By: Jearld Lesch M.D.   On: 05/29/2021 10:06   DG Chest Portable 1 View  Result Date: 05/29/2021 CLINICAL DATA:  Abdominal pain. EXAM: PORTABLE CHEST 1 VIEW COMPARISON:  April 12, 2020. FINDINGS: The heart size and mediastinal contours are within normal limits. Left-sided cardiac device in situ is unchanged in position. Both lungs are clear. The visualized skeletal structures are unremarkable. IMPRESSION: No active disease. Aortic Atherosclerosis (ICD10-I70.0). Electronically Signed   By: Lupita Raider M.D.   On: 05/29/2021 08:48     Labs on Admission: I have personally reviewed the available labs and imaging studies at the time of  the admission.  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 Recommended SLP eval for dysphagia. If passes, will purge bowels from above.  -IVF -tap water enema -check TSH and avoid opioids, anticholinergics or other drugs that slow transit -can not ambulate -abdominal Xray in AM   Active Problems: Nausea/vomiting/decreased PO intake -electrolytes do not suggest dehydration -she only has what sounds like "spitting up" with food -tolerates liquids with no spitting up or coughing. Will do oral meds for now if passes swallow screen with full SLP eval to follow.  -IVF, SLP eval    CVA (cerebral vascular accident)  with right hemiplegia and aphasia  -at baseline -SLP swallow eval -no no antiplatelets or statin?     Seizures (HCC) -seizure precautions -continue AED: keppra, dilantin and depakene.     Diabetes mellitus without complication (HCC) -a1c 5.3 in 12/2019 -a1c pending, sensitive SSI for now and may not need if continues to be so well controlled.     Essential hypertension Continue hyzaar    Hypothyroidism -check Tsh -continue synthroid     Hyperlipidemia -appears she is not taking her statin.   OSA  -on no cpap or oxygen at night     There is no height or weight on file to calculate BMI.    Level of care: Telemetry Medical DVT prophylaxis:  SCDs Code Status:  Full - confirmed with patient/family Family Communication: Chantell Thackston (daughter) and [place] hunt (granddaughter) Disposition Plan:  The patient is from: SNF  Anticipated d/c is to: SNF  Requires inpatient hospitalization for observation of pseudo bowel obstruction, bowel clean out and requires constant monitoring,  assessment and MDM with specialists.    Patient is currently: stable  Consults called: GI  Admission status:  observation   Dragon dictation used in completing this note.   Orland Mustard MD [facility]   How to contact the [facility] Attending or Consulting provider 7A - 7P or covering provider during after hours 7P -7A, for this patient?  Check the care team in [facility] and look for a) attending/consulting [facility] provider listed and b) the [facility] team listed Log into [email or web address] and use [facility] universal password to access. If you do not have the password, please contact the hospital operator. Locate the [facility] provider you are looking for under [facility] and page to a number that you can be directly reached. If you still have difficulty reaching the provider, please page the [facility] (Director on Call) for the Hospitalists listed on amion for assistance.   05/29/2021, 12:44 PM

## 2021-05-29 NOTE — ED Notes (Signed)
Alert, NAD, calm, interactive, resps e/u, speaking clearly, with some mixed up words (yes sometimes means no, can rephrase). Tolerated PO meds. Some crushed and placed in suspensions or solutions to be given. Can swallow pills one at a time, but less tolerant of liquid volume (easily full).

## 2021-05-29 NOTE — ED Notes (Signed)
Attempted report 

## 2021-05-29 NOTE — Consult Note (Addendum)
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     She has chronic constipation and CTAP shows stool throughout colon and stool balls in rectum.         --tap water enema --if bedside swallowing evaluation is okay then will purge bowels from above --K+ is 4.0.  --Check TSH --She is going to need a more aggressive outpatient bowel regimen  Addendum: Per H+P patient is tolerating sips of water. Will start Nystatin S+S   # Vomiting. Talking with Granddaughter it sounds like she only "vomits" with PO attempts. I wonder if she is having dysphagia rather than true nausea / vomiting. She has a thick coating on tongue and teeth raising suspicion for oral candida.  --After swallowing evaluation, if safe for PO will give Nystatin S+S. I didn't start Diflucan since it can increase Dilantin level --If dysphagia evaluation negative and symptoms don't resolve with treatment of constipation then consider EGD   # Additional medical history listed below.   HISTORY OF PRESENT ILLNESS                                                                                                                         Chief Complaint: none from patient. Granddaughters at bedside and describe chronic constipation, vomiting for two weeks  Kathleen Jensen is a 70 y.o. female with a past medical history significant for CVA, expressive aphasia, right hemiplegia, HTN, HLD, pacemaker, CKD, seizures, hypothyroidism, GERD, roux-en-Y, chronic constipation. See PMH for any additional medical problems.    ED course:  Patient presented to ED from [facility] for evaluation of constipation and AMS.   No  acute findings on head CT scan. BUN 24, Cr 0.87, AST 44, albumin 3.2.  WBC 10.8. Hgb normal at 13.8. Mg+ and K+ normal. CTAP >> stool throughout colon, stools balls in rectum. Patulous sigmoid measuring 7 cm. Roux-en-y anatomy, air fluid levels in bladder EDP attempted disimpaction but couldn't remove any stool. At Nursing Home patient is on daily Miralax and fiber. She takes  Mg+ BID. Granddaughter frustrated that patient re-hospitalized again with constipation. Over the last two weeks patient has not tolerated any PO, not even water. Family tells me she immediately vomits up anything she swallows. and she vomits only with attempts at PO intake per family. Patient is unable to provide history due to expressive aphasia.     Imaging:  CT Head Wo Contrast  Result Date: 05/28/2021 CLINICAL DATA:  Mental status change, unknown cause EXAM: CT HEAD WITHOUT CONTRAST TECHNIQUE: Contiguous axial images were obtained from the base of the skull through the vertex without  intravenous contrast. COMPARISON:  CT head 04/11/2020 BRAIN: BRAIN Patchy and confluent areas of decreased attenuation are noted throughout the deep and periventricular white matter of the cerebral hemispheres bilaterally, compatible with chronic microvascular ischemic disease. Patchy and confluent areas of decreased attenuation are noted throughout the deep and periventricular white matter of the cerebral hemispheres bilaterally, compatible with chronic microvascular ischemic disease. Left parietal encephalomalacia. No evidence of large-territorial acute infarction. No parenchymal hemorrhage. No mass lesion. No extra-axial collection. No mass effect or midline shift. No hydrocephalus. Basilar cisterns are patent. Vascular: No hyperdense vessel. Atherosclerotic calcifications are present within the cavernous internal carotid arteries. Skull: No acute fracture or focal lesion. Sinuses/Orbits: Paranasal sinuses and mastoid air cells are clear. The orbits  are unremarkable. Other: None. IMPRESSION: No acute intracranial abnormality. Electronically Signed   By: Tish FredericksonMorgane  Naveau M.D.   On: 05/28/2021 19:16   CT ABDOMEN PELVIS W CONTRAST  Result Date: 05/29/2021 CLINICAL DATA:  Abdominal pain, constipation, altered mental status EXAM: CT ABDOMEN AND PELVIS WITH CONTRAST TECHNIQUE: Multidetector CT imaging of the abdomen and pelvis was performed using the standard protocol following bolus administration of intravenous contrast. CONTRAST:  80mL OMNIPAQUE IOHEXOL 300 MG/ML  SOLN COMPARISON:  08/06/2018 FINDINGS: Lower chest: No acute abnormality. Hepatobiliary: No focal liver abnormality is seen. Status post cholecystectomy. Mild postoperative biliary dilatation. Pancreas: Unremarkable. No pancreatic ductal dilatation or surrounding inflammatory changes. Spleen: Normal in size without significant abnormality. Adrenals/Urinary Tract: Adrenal glands are unremarkable. Kidneys are normal, without renal calculi, solid lesion, or hydronephrosis. Small air-fluid level in the urinary bladder. Stomach/Bowel: Status post Roux type gastric bypass. Appendix appears normal. No evidence of bowel wall thickening, distention, or inflammatory changes. Large burden of stool throughout the colon and rectum, with stool balls in the rectum. The patulous sigmoid colon is mildly distended, measuring up to 7.0 cm. Vascular/Lymphatic: Aortic atherosclerosis. No enlarged abdominal or pelvic lymph nodes. Reproductive: Calcified uterine fibroids. Other: No abdominal wall hernia or abnormality. No abdominopelvic ascites. Musculoskeletal: No acute or significant osseous findings. IMPRESSION: 1. Large burden of stool throughout the colon and rectum, with stool balls in the rectum. 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 2. Small air-fluid level in the urinary bladder. Correlate for recent catheterization.  3. Status post Roux type gastric bypass. 4. Uterine fibroids. Aortic Atherosclerosis (ICD10-I70.0). Electronically Signed   By: Jearld LeschAlex D Bibbey M.D.   On: 05/29/2021 10:06   DG Chest Portable 1 View  Result Date: 05/29/2021 CLINICAL DATA:  Abdominal pain. EXAM: PORTABLE CHEST 1 VIEW COMPARISON:  April 12, 2020. FINDINGS: The heart size and mediastinal contours are within normal limits. Left-sided cardiac device in situ is unchanged in position. Both lungs are clear. The visualized skeletal structures are unremarkable. IMPRESSION: No active disease. Aortic Atherosclerosis (ICD10-I70.0). Electronically Signed   By: Lupita RaiderJames  Green Jr M.D.   On: 05/29/2021 08:48     PREVIOUS ENDOSCOPIC EVALUATIONS  / IMAGING STUDIES  [facility] ( Care Everywhere) but I cannot see records   Past Medical History:  Diagnosis Date   Aphasia    Arthritis    Asthma    Cardiac pacemaker    Cerebral amyloid angiopathy (CODE)    CKD (chronic kidney disease)    Cognitive communication deficit    Diabetes mellitus without complication (HCC)    resolved after gastric bypass   GERD (gastroesophageal reflux disease)    Hemiplegia and  hemiparesis following unspecified cerebrovascular disease affecting right dominant side ([place])    Hyperlipemia    Hypertension    Insomnia    Intracerebral hemorrhage, intraventricular (HCC)    Muscle weakness    Other abnormalities of gait and mobility    Seizures (HCC)    Stroke (HCC)    Unsteadiness on feet    Vitamin B deficiency     Past Surgical History:  Procedure Laterality Date   BACK SURGERY     Dec 18, 2016 Dr.Torrealba   BREAST BIOPSY Right    BREAST EXCISIONAL BIOPSY     CERVICAL SPINE SURGERY     C4   CESAREAN SECTION     GASTRIC BYPASS  03/2007   TONSILLECTOMY      Prior to Admission medications   Medication Sig Start Date End Date Taking? Authorizing Provider  acetaminophen (TYLENOL) 500 MG tablet Take 1,000 mg by  mouth 2 (two) times daily as needed.    [provider]  B Complex-C-Folic Acid TABS Take 1 tablet by mouth daily.    [provider]  Baclofen 5 MG TABS Take 5 mg by mouth in the morning and at bedtime.    [provider]  carbamazepine (TEGRETOL) 200 MG tablet Take 200 mg by mouth in the morning and at bedtime.    [provider]  gabapentin (NEURONTIN) 300 MG capsule Take 1 capsule by mouth in the morning and at bedtime.  01/21/18   [provider]  levETIRAcetam (KEPPRA) 1000 MG tablet Take 2 tablets (2,000 mg total) by mouth 2 (two) times daily. 04/14/20   Rai, Vernelle Emerald, MD  levothyroxine (SYNTHROID) 75 MCG tablet Take 75 mcg by mouth daily before breakfast.    [provider]  losartan-hydrochlorothiazide (HYZAAR) 50-12.5 MG tablet Take 1 tablet by mouth daily.    [provider]  magnesium oxide (MAG-OX) 400 MG tablet Take 400 mg by mouth 2 (two) times daily.    [provider]  meclizine (ANTIVERT) 12.5 MG tablet Take 12.5 mg by mouth 3 (three) times daily as needed for dizziness.    [provider]  phenytoin (DILANTIN) 100 MG ER capsule Take 2 capsules (200 mg total) by mouth 2 (two) times daily. 04/14/20   Rai, Vernelle Emerald, MD  polycarbophil (FIBERCON) 625 MG tablet Take 1,250 mg by mouth daily. With 8 oz of water.     [provider]  polyethylene glycol (MIRALAX / GLYCOLAX) packet Take 17 g by mouth daily. Mix with 6 - 8 oz liquid.    [provider]  senna-docusate (SENOKOT-S) 8.6-50 MG tablet Take 1 tablet by mouth 2 (two) times daily.    [provider]  simvastatin (ZOCOR) 10 MG tablet Take 10 mg by mouth daily.    [provider]  venlafaxine XR (EFFEXOR-XR) 75 MG 24 hr capsule Take 150 mg by mouth daily.    [provider]  Vitamin D, Ergocalciferol, (DRISDOL) 1.25 MG (50000 UNIT) CAPS capsule Take 50,000 Units by mouth every 30 (thirty) days.    [provider]    No current facility-administered medications for this encounter.   Current Outpatient Medications  Medication Sig Dispense Refill   acetaminophen (TYLENOL) 500 MG tablet Take 1,000 mg by mouth 2 (two) times daily as needed.     B Complex-C-Folic Acid TABS Take 1 tablet by mouth daily.     Baclofen 5 MG TABS Take 5 mg by mouth in the morning and at bedtime.  carbamazepine (TEGRETOL) 200 MG tablet Take 200 mg by mouth in the morning and at bedtime.     gabapentin (NEURONTIN) 300 MG capsule Take 1 capsule by mouth in the morning and at bedtime.      levETIRAcetam (KEPPRA) 1000 MG tablet Take 2 tablets (2,000 mg total) by mouth 2 (two) times daily.     levothyroxine (SYNTHROID) 75 MCG tablet Take 75 mcg by mouth daily before breakfast.     losartan-hydrochlorothiazide (HYZAAR) 50-12.5 MG tablet Take 1 tablet by mouth daily.     magnesium oxide (MAG-OX) 400 MG tablet Take 400 mg by mouth 2 (two) times daily.     meclizine (ANTIVERT) 12.5 MG tablet Take 12.5 mg by mouth 3 (three) times daily as needed for dizziness.     phenytoin (DILANTIN) 100 MG ER capsule Take 2 capsules (200 mg total) by mouth 2 (two) times daily.     polycarbophil (FIBERCON) 625 MG tablet Take 1,250 mg by mouth daily. With 8 oz of water.      polyethylene glycol (MIRALAX / GLYCOLAX) packet Take 17 g by mouth daily. Mix with 6 - 8 oz liquid.     senna-docusate (SENOKOT-S) 8.6-50 MG tablet Take 1 tablet by mouth 2 (two) times daily.     simvastatin (ZOCOR) 10 MG tablet Take 10 mg by mouth daily.     venlafaxine XR (EFFEXOR-XR) 75 MG 24 hr capsule Take 150 mg by mouth daily.     Vitamin D, Ergocalciferol, (DRISDOL) 1.25 MG (50000 UNIT) CAPS capsule Take 50,000 Units by mouth every 30 (thirty) days.      Allergies as of 05/28/2021 - Review Complete 04/12/2020  Allergen Reaction Noted   Tape Rash 01/26/2016   Meperidine hcl Nausea And Vomiting 09/21/2007   Sulfonamide derivatives Itching 09/21/2007    Bacitracin-polymyxin b Dermatitis 01/26/2016   Oxycodone Itching 01/24/2016    Family History  Problem Relation Age of Onset   High blood pressure Mother    Diabetes Mother    Diabetes Father    Stroke Father    Cancer Sister        Colorectal cancer   Cancer Brother        colon    Social History   Socioeconomic History   Marital status: Divorced    Spouse name: Not on file   Number of children: 3   Years of education: 89yrs   Highest education level: Not on file  Occupational History   Occupation: Retired    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   Caffeine Use: 1 cup daily   1 dog   Social Determinants of Health   Financial Resource Strain: Not on file  Food Insecurity: Not on file  Transportation Needs: Not on file  Physical Activity: Not on file  Stress: Not on file  Social Connections: Not on file  Intimate Partner Violence: Not on file    Review  of Systems: Unable to obtain.   OBJECTIVE    Physical Exam: Vital signs in last 24 hours: Temp:  [98.2 F (36.8 C)-98.8 F (37.1 C)] 98.3 F (36.8 C) (11/01 0440) Pulse Rate:  [58-84] 60 (11/01 1222) Resp:  [13-21] 18 (11/01 1222) BP: (106-156)/(67-107) 132/81 (11/01 1222) SpO2:  [95 %-100 %] 99 % (11/01 1222)    General:  Alert obese female in NAD Psych:  Pleasant, cooperative.  Eyes: Pupils equal, no icterus. Conjunctive pink Ears:  Normal auditory acuity Nose: No deformity, discharge or lesions Neck:  Supple, no masses felt Lungs:  Chest clear to  auscultation.  Heart:  Regular rate, regular rhythm. No lower extremity edema Abdomen:  Soft, mildly distended with tympany, a few bowel sounds. No obvious masses.  Rectal :  Deferred. Already done by EDP Msk: Symmetrical without gross deformities.  Neurologic:  Alert. Her sentences do not really relate to conversation  Skin:  Intact without significant lesions.    Scheduled inpatient medication  Intake/Output from previous day: No intake/output data recorded. Intake/Output this shift: Total I/O In: 500 [IV Piggyback:500] Out: -    Lab Results: Recent Labs    05/28/21 1450 05/29/21 0904  WBC 10.0 10.8*  HGB 15.2* 13.8  HCT 47.3* 43.5  PLT 246 204   BMET Recent Labs    05/28/21 1450 05/29/21 0904  NA 138 137  K 4.3 4.0  CL 100 99  CO2 28 30  GLUCOSE 105* 91  BUN 22 24*  CREATININE 0.78 0.87  CALCIUM 9.2 8.9   LFTs Recent Labs    05/29/21 0904  PROT 8.0  ALBUMIN 3.2*  AST 44*  ALT 31  ALKPHOS 60  BILITOT 0.7   PT/INR No results for input(s): LABPROT, INR in the last 72 hours. Hepatitis Panel No results for input(s): HEPBSAG, HCVAB, HEPAIGM, HEPBIGM in the last 72 hours.   . CBC Latest Ref Rng & Units 05/29/2021 05/28/2021 04/12/2020  WBC 4.0 - 10.5 K/uL 10.8(H) 10.0 7.1  Hemoglobin 12.0 - 15.0 g/dL 13.8 15.2(H) 11.5(L)  Hematocrit 36.0 - 46.0 % 43.5 47.3(H) 37.3  Platelets 150 - 400 K/uL 204 246 205    . CMP Latest Ref Rng & Units 05/29/2021 05/28/2021 04/14/2020  Glucose 70 - 99 mg/dL 91 105(H) 87  BUN 8 - 23 mg/dL 24(H) 22 13  Creatinine 0.44 - 1.00 mg/dL 0.87 0.78 0.71  Sodium 135 - 145 mmol/L 137 138 133(L)  Potassium 3.5 - 5.1 mmol/L 4.0 4.3 3.5  Chloride 98 - 111 mmol/L 99 100 99  CO2 22 - 32 mmol/L 30 28 27   Calcium 8.9 - 10.3 mg/dL 8.9 9.2 8.2(L)  Total Protein 6.5 - 8.1 g/dL 8.0 8.1 6.8  Total Bilirubin 0.3 - 1.2 mg/dL 0.7 0.6 0.6  Alkaline Phos 38 - 126 U/L 60 67 93  AST 15 - 41 U/L 44(H) 49(H) 31  ALT 0 - 44 U/L 31 31 26       Principal Problem:   Ogilvie syndrome Active Problems:   Hypothyroidism   OBSTRUCTIVE SLEEP APNEA   Essential hypertension   GERD   Hyperlipidemia   Diabetes mellitus without complication (HCC)   CVA (cerebral vascular accident) ([place])   Seizures ([place])   Right hemiplegia ([place])    Tye Savoy, NP-C @  05/29/2021, 1:21 PM

## 2021-05-29 NOTE — ED Notes (Signed)
Pt brief changed. Patient is incont. Small bowel movement. Peri-care performed and brief and disposable chuck changed

## 2021-05-29 NOTE — ED Provider Notes (Signed)
[facility] EMERGENCY DEPARTMENT Provider Note   CSN: [id number] Arrival date & time: 05/28/21  1425     History Chief Complaint  Patient presents with   Abdominal Pain    Kathleen Jensen is a 70 y.o. female with history of expressive aphasia, CKD, CVA, seizure, GERD, right side hemiplegia, hypertension, hyperlipidemia presented with her family today from nursing facility.  They report patient has been complaining of abdominal pain for around 1-2 weeks and has had difficulty with bowel movements and constipation.  She has been treated with enemas and MiraLAX at the nursing facility without significant improvement.  Over the past week patient has had difficulty keeping down food and water and has had recurrent nonbloody/nonbilious emesis.  Level 5 caveat aphasia HPI     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)    Hyperlipemia    Hypertension    Insomnia    Intracerebral hemorrhage, intraventricular (HCC)    Muscle weakness    Other abnormalities of gait and mobility    Seizures (HCC)    Stroke (HCC)    Unsteadiness on feet    Vitamin B deficiency     Patient Active Problem List   Diagnosis Date Noted   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   Right hemiplegia (HCC) 04/12/2020   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   Hyperlipidemia associated with type 2 diabetes mellitus (HCC) 07/30/2018   History of cerebral infarction 07/30/2018   Vitamin B 12 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/30/2018   Asthma 03/13/2018   Seizures (HCC) 01/12/2018   CVA  (cerebral vascular accident) (HCC) 12/08/2017   Abnormal thyroid blood test 12/08/2017   Pulmonary embolism and infarction (HCC) 12/08/2017   Anemia 12/08/2017   Expressive aphasia 10/15/2017   Seizure disorder (HCC) 10/15/2017   Dysphagia 10/15/2017   Diabetes mellitus without complication (HCC)    Hyperlipidemia 09/22/2017   Urinary frequency 09/22/2017   Airway hyperreactivity 01/26/2016   Disease of thyroid gland 01/26/2016   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   Other specified postprocedural states 01/26/2016   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   Chronic LBP 04/10/2015   Degeneration of intervertebral disc of lumbar region 04/10/2015   Spondylolisthesis of lumbar region 04/10/2015   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   History of cardiac pacemaker in situ 01/09/2015   B12 DEFICIENCY 08/15/2008   UNSPECIFIED VITAMIN D DEFICIENCY 08/15/2008   SINUS BRADYCARDIA 08/15/2008   OBSTRUCTIVE SLEEP APNEA 08/11/2008   INSOMNIA 04/28/2008   Hyperlipidemia LDL goal <70 12/12/2006   RHINITIS, ALLERGIC NEC 12/12/2006   Hypothyroidism 12/11/2006   Essential hypertension 12/11/2006   GERD 12/11/2006   STRESS INCONTINENCE 12/11/2006    Past Surgical History:  Procedure Laterality Date   BACK SURGERY     Dec 18, 2016 Dr.Torrealba   BREAST BIOPSY Right    BREAST EXCISIONAL BIOPSY     CERVICAL SPINE SURGERY     C4   CESAREAN SECTION     GASTRIC BYPASS  03/2007   TONSILLECTOMY       OB History   No obstetric history on  file.     Family History  Problem Relation Age of Onset   High blood pressure Mother    Diabetes Mother    Diabetes Father    Stroke Father    Cancer Sister        Colorectal cancer   Cancer Brother        colon    Social History   Tobacco Use   Smoking status: Former    Years: 4.00    Types: Cigarettes    Quit date: 07/30/1979    Years since quitting: 41.8   Smokeless tobacco: Never  Vaping Use   Vaping Use: Never used  Substance Use  Topics   Alcohol use: No    Alcohol/week: 0.0 standard drinks   Drug use: No    Home Medications Prior to Admission medications   Medication Sig Start Date End Date Taking? Authorizing Provider  acetaminophen (TYLENOL) 500 MG tablet Take 1,000 mg by mouth 2 (two) times daily as needed.    [provider]  B Complex-C-Folic Acid TABS Take 1 tablet by mouth daily.    [provider]  Baclofen 5 MG TABS Take 5 mg by mouth in the morning and at bedtime.    [provider]  carbamazepine (TEGRETOL) 200 MG tablet Take 200 mg by mouth in the morning and at bedtime.    [provider]  gabapentin (NEURONTIN) 300 MG capsule Take 1 capsule by mouth in the morning and at bedtime.  01/21/18   [provider]  levETIRAcetam (KEPPRA) 1000 MG tablet Take 2 tablets (2,000 mg total) by mouth 2 (two) times daily. 04/14/20   Rai, Vernelle Emerald, MD  levothyroxine (SYNTHROID) 75 MCG tablet Take 75 mcg by mouth daily before breakfast.    [provider]  losartan-hydrochlorothiazide (HYZAAR) 50-12.5 MG tablet Take 1 tablet by mouth daily.    [provider]  magnesium oxide (MAG-OX) 400 MG tablet Take 400 mg by mouth 2 (two) times daily.    [provider]  meclizine (ANTIVERT) 12.5 MG tablet Take 12.5 mg by mouth 3 (three) times daily as needed for dizziness.    [provider]  phenytoin (DILANTIN) 100 MG ER capsule Take 2 capsules (200 mg total) by mouth 2 (two) times daily. 04/14/20   Rai, Vernelle Emerald, MD  polycarbophil (FIBERCON) 625 MG tablet Take 1,250 mg by mouth daily. With 8 oz of water.     [provider]  polyethylene glycol (MIRALAX / GLYCOLAX) packet Take 17 g by mouth daily. Mix with 6 - 8 oz liquid.    [provider]  senna-docusate (SENOKOT-S) 8.6-50 MG tablet Take 1 tablet by mouth 2 (two) times daily.    [provider]  simvastatin (ZOCOR) 10 MG tablet Take 10 mg by mouth daily.    [provider]  venlafaxine XR (EFFEXOR-XR) 75 MG 24 hr capsule Take 150 mg by mouth daily.    [provider]  Vitamin D, Ergocalciferol, (DRISDOL) 1.25 MG (50000 UNIT) CAPS capsule Take 50,000 Units by mouth every 30 (thirty) days.    [provider]    Allergies    Tape, Meperidine hcl, Sulfonamide derivatives, Bacitracin-polymyxin b, and Oxycodone  Review of Systems   Review of Systems  Unable to perform ROS: Other  Expressive aphasia Physical Exam Updated Vital Signs BP 132/81 (BP Location: Right Arm)   Pulse 60   Temp 98.3 F (36.8 C) (Oral)   Resp 18   SpO2 99%  Physical Exam Constitutional:      General: She is not in acute distress.    Appearance: She is obese. She is not ill-appearing or diaphoretic.  HENT:     Head: Normocephalic and atraumatic.     Right Ear: External ear normal.     Left Ear: External ear normal.     Nose: Nose normal.  Eyes:     Conjunctiva/sclera: Conjunctivae normal.  Cardiovascular:     Rate and Rhythm: Normal rate and regular rhythm.  Pulmonary:     Effort: Pulmonary effort is normal.     Breath sounds: Normal breath sounds.  Abdominal:     Palpations: Abdomen is soft.     Tenderness: There is abdominal tenderness. There is no guarding or rebound.  Musculoskeletal:     Cervical back: Normal range of motion.  Neurological:     Mental Status: She is alert.     GCS: GCS eye subscore is 4. GCS verbal subscore is 4. GCS motor subscore is 6.    ED Results / Procedures / Treatments   Labs (all labs ordered are listed, but only abnormal results are displayed) Labs Reviewed  COMPREHENSIVE METABOLIC PANEL - Abnormal; Notable for the following components:      Result Value   Glucose, Bld 105 (*)    Albumin 3.2 (*)    AST 49 (*)    All other components within normal limits  CBC - Abnormal; Notable for the following components:   Hemoglobin 15.2 (*)    HCT 47.3 (*)    All other components within normal limits  CBC  WITH DIFFERENTIAL/PLATELET - Abnormal; Notable for the following components:   WBC 10.8 (*)    Lymphs Abs 4.7 (*)    All other components within normal limits  COMPREHENSIVE METABOLIC PANEL - Abnormal; Notable for the following components:   BUN 24 (*)    Albumin 3.2 (*)    AST 44 (*)    All other components within normal limits  LIPASE, BLOOD  URINALYSIS, ROUTINE W REFLEX MICROSCOPIC  CBG MONITORING, ED    EKG None  Radiology CT Head Wo Contrast  Result Date: 05/28/2021 CLINICAL DATA:  Mental status change, unknown cause EXAM: CT HEAD WITHOUT CONTRAST TECHNIQUE: Contiguous axial images were obtained from the base of the skull through the vertex without intravenous contrast. COMPARISON:  CT head 04/11/2020 BRAIN: BRAIN Patchy and confluent areas of decreased attenuation are noted throughout the deep and periventricular white matter of the cerebral hemispheres bilaterally, compatible with chronic microvascular ischemic disease. Patchy and confluent areas of decreased attenuation are noted throughout the deep and periventricular white matter of the cerebral hemispheres bilaterally, compatible with chronic microvascular ischemic disease. Left parietal encephalomalacia. No evidence of large-territorial acute infarction. No parenchymal hemorrhage. No mass lesion. No extra-axial collection. No mass effect or midline shift. No hydrocephalus. Basilar cisterns are patent. Vascular: No hyperdense vessel. Atherosclerotic calcifications are present within the cavernous internal carotid arteries. Skull: No acute fracture or focal lesion. Sinuses/Orbits: Paranasal sinuses and mastoid air cells are clear. The orbits are unremarkable. Other: None. IMPRESSION: No acute intracranial abnormality. Electronically Signed   By: Iven Finn M.D.   On: 05/28/2021 19:16   CT ABDOMEN PELVIS W CONTRAST  Result Date: 05/29/2021 CLINICAL DATA:  Abdominal pain, constipation, altered mental status EXAM: CT ABDOMEN AND  PELVIS WITH CONTRAST TECHNIQUE: Multidetector CT imaging of the abdomen and pelvis was performed using the standard protocol following bolus administration of intravenous contrast. CONTRAST:  77mL  OMNIPAQUE IOHEXOL 300 MG/ML  SOLN COMPARISON:  08/06/2018 FINDINGS: Lower chest: No acute abnormality. Hepatobiliary: No focal liver abnormality is seen. Status post cholecystectomy. Mild postoperative biliary dilatation. Pancreas: Unremarkable. No pancreatic ductal dilatation or surrounding inflammatory changes. Spleen: Normal in size without significant abnormality. Adrenals/Urinary Tract: Adrenal glands are unremarkable. Kidneys are normal, without renal calculi, solid lesion, or hydronephrosis. Small air-fluid level in the urinary bladder. Stomach/Bowel: Status post Roux type gastric bypass. Appendix appears normal. No evidence of bowel wall thickening, distention, or inflammatory changes. Large burden of stool throughout the colon and rectum, with stool balls in the rectum. The patulous sigmoid colon is mildly distended, measuring up to 7.0 cm. Vascular/Lymphatic: Aortic atherosclerosis. No enlarged abdominal or pelvic lymph nodes. Reproductive: Calcified uterine fibroids. Other: No abdominal wall hernia or abnormality. No abdominopelvic ascites. Musculoskeletal: No acute or significant osseous findings. IMPRESSION: 1. Large burden of stool throughout the colon and rectum, with stool balls in the rectum. 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 2. Small air-fluid level in the urinary bladder. Correlate for recent catheterization. 3. Status post Roux type gastric bypass. 4. Uterine fibroids. Aortic Atherosclerosis (ICD10-I70.0). Electronically Signed   By: Delanna Ahmadi M.D.   On: 05/29/2021 10:06   DG Chest Portable 1 View  Result Date: 05/29/2021 CLINICAL DATA:  Abdominal pain. EXAM: PORTABLE CHEST 1 VIEW COMPARISON:   April 12, 2020. FINDINGS: The heart size and mediastinal contours are within normal limits. Left-sided cardiac device in situ is unchanged in position. Both lungs are clear. The visualized skeletal structures are unremarkable. IMPRESSION: No active disease. Aortic Atherosclerosis (ICD10-I70.0). Electronically Signed   By: Marijo Conception M.D.   On: 05/29/2021 08:48    Procedures Fecal disimpaction  Date/Time: 05/29/2021 11:16 AM Performed by: Deliah Boston, PA-C Authorized by: Deliah Boston, PA-C  Consent: Verbal consent obtained. Risks and benefits: risks, benefits and alternatives were discussed Consent given by: patient and guardian (POA Charrise) Patient understanding: patient states understanding of the procedure being performed Patient consent: the patient's understanding of the procedure matches consent given Test results: test results available and properly labeled Imaging studies: imaging studies available Required items: required blood products, implants, devices, and special equipment available Patient identity confirmed: verbally with patient and arm band Time out: Immediately prior to procedure a "time out" was called to verify the correct patient, procedure, equipment, support staff and site/side marked as required. Local anesthesia used: no  Anesthesia: Local anesthesia used: no  Sedation: Patient sedated: no  Patient tolerance: patient tolerated the procedure well with no immediate complications Comments: Chaperone by Arta Bruce NT and Kiara PA-S.  Soft light brown stool present in the rectum, no palpable impaction.  Small stage I sacral pressure wound present.     Medications Ordered in ED Medications  sodium chloride 0.9 % bolus 500 mL (0 mLs Intravenous Stopped 05/29/21 1124)  morphine 2 MG/ML injection 2 mg (2 mg Intravenous Given 05/29/21 0931)  iohexol (OMNIPAQUE) 300 MG/ML solution 80 mL (80 mLs Intravenous Contrast Given 05/29/21 0951)  sodium  chloride 0.9 % bolus 1,000 mL (1,000 mLs Intravenous New Bag/Given 05/29/21 1209)    ED Course  I have reviewed the triage vital signs and the nursing notes.  Pertinent labs & imaging results that were available during my care of the patient were reviewed by me and considered in my medical decision making (see chart for details).  Clinical Course as of 05/29/21 1236  Tue May 29, 2021  [place] [BM]  [facility] [BM]  [facility] [BM]    Clinical Course User Index [BM] Gari Crown   MDM Rules/Calculators/A&P                          Additional history obtained from: Nursing notes from this visit. Review of electronic medical records. Family, daughters at bedside and power of attorney Marvia Pickles over the phone. ---- 70 year old female with complex medical history as above presented from [facility] facility for abdominal pain and constipation ongoing for the past 2 weeks despite treatments with MiraLAX and enemas.  Patient has now developed nausea vomiting and inability to hold down p.o. for the past 1 week.  On exam she is mildly tender to the abdomen without rebound or guarding.  She has expressive aphasia which family reports as baseline.  I reviewed and interpreted labs which include: CBC shows mild leukocytosis of 10.8, no anemia or thrombocytopenia. CMP without emergent electrolyte derangement, AKI or gap.  AST minimally elevated at 44. CBG 89 Lipase wnl  CXR:  IMPRESSION:  No active disease.     Aortic Atherosclerosis (ICD10-I70.0).   CT Head:  IMPRESSION:  No acute intracranial abnormality.   CT AP:  IMPRESSION:  1. Large burden of stool throughout the colon and rectum, with stool  balls in the rectum. 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  2. Small air-fluid level in the urinary bladder. Correlate for   recent catheterization.  3. Status post Roux type gastric bypass.  4. Uterine fibroids.     Aortic Atherosclerosis (ICD10-I70.0).  ----- Discussed findings above with patient and her POA [name] over the phone.  We discussed manual disimpaction, patient has undergone this before.  Consent obtained from both patient and POA, attempted disimpaction without success.  Consult was then placed to general surgery spoke to [facility] who advised consulting gastroenterology instead for likely bowel regimen and other recommendations.  I then spoke to Ron Parker from gastroenterology who advised they will come down and see the patient.  At 12:33 PM I consulted hospitalist and spoke to Dr. Eliberto Ivory and patient was accepted for admission for treatment of inability tolerate p.o. along with pseudoobstruction.  Patient was seen and evaluated by attending physician Dr. Maryan Rued during this visit who agrees with admission and plan of care.  12:36 PM: I updated patient's daughter/POA [name] on care plan [phone].  She is agreeable.     Note: Portions of this report may have been transcribed using voice recognition software. Every effort was made to ensure accuracy; however, inadvertent computerized transcription errors may still be present.  Final Clinical Impression(s) / ED Diagnoses Final diagnoses:  Constipation, unspecified constipation type    Rx / DC Orders ED Discharge Orders     None        Gari Crown 05/29/21 1236    Blanchie Dessert, MD 06/04/21 1326

## 2021-05-29 NOTE — ED Notes (Signed)
Oxygen tank replaced °

## 2021-05-29 NOTE — ED Notes (Signed)
SLP at BS 

## 2021-05-29 NOTE — Evaluation (Signed)
Clinical/Bedside Swallow Evaluation Patient Details  Name: Kathleen Jensen MRN: [id number] Date of Birth: [birth year]  Today's Date: 05/29/2021 Time: SLP Start Time (ACUTE ONLY): 1525 SLP Stop Time (ACUTE ONLY): 1545 SLP Time Calculation (min) (ACUTE ONLY): 20 min  Past Medical History:  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)    Hyperlipemia    Hypertension    Insomnia    Intracerebral hemorrhage, intraventricular (HCC)    Muscle weakness    Other abnormalities of gait and mobility    Seizures (HCC)    Stroke (HCC)    Unsteadiness on feet    Vitamin B deficiency    Past Surgical History:  Past Surgical History:  Procedure Laterality Date   BACK SURGERY     Dec 18, 2016 Dr.Torrealba   BREAST BIOPSY Right    BREAST EXCISIONAL BIOPSY     CERVICAL SPINE SURGERY     C4   CESAREAN SECTION     GASTRIC BYPASS  03/2007   TONSILLECTOMY     HPI:  70 yo female with obesity, CVA with expressive aphasia and right hemiplegia, seizures, hypothyroidism, roux-en-y being admitted with AMS, constipation / vomiting.     She has chronic constipation and CT of abdomen shows stool throughout colon and stool balls in rectum suggestive of Ogilvie's Syndrome.    Assessment / Plan / Recommendation  Clinical Impression  Pt presenting with a mild oral dysphagia (suspected to be at baseline from prior CVA) and suspected intact pharyngeal function. Suspect 2 week emesis episodes with PO consumption as reported in medical chart are related to Ogilives Syndrome. Mild oral deficits exhibited from previous CVA;  some discoordination with ability to make a labial seal to cup/appearing consistent with oral apraxia. Pt unable to syphoon for use of straw despite attempt. Pt with hx of expressive and receptive aphasia limiting ability for formal oral motor exam. She did have lingual coating consistent with thrush (also reported per GI notes; recommend intervention for suspected thrush). No overt s/sx of aspiration with small amounts of POs during clinical swallow assessment (ice chips, water, 1/2 tsp puree, small piece of solid). Recommend clear liquid diet in setting of emesis hx per GI discretion and advancement of diet as tolerated per GI recommendations. No further ST needs identified. Please reconsult ST for any future needs.  SLP Visit Diagnosis: Dysphagia, unspecified (R13.10);Dysphagia, oral phase (R13.11)    Aspiration Risk  Mild aspiration risk    Diet Recommendation  Clear liquid diet with acute hx of emesis; advance as tolerated per GI recommendations   Medication Administration: Crushed with puree    Other  Recommendations Recommended Consults:  (GI following, continue per GI recommendations) Oral Care Recommendations: Oral care BID (intervention for thrush)    Recommendations for follow up therapy are one component of a multi-disciplinary discharge planning process, led by the attending physician.  Recommendations may be updated based on patient status, additional functional criteria and insurance authorization.  Follow up Recommendations Skilled Nursing facility      Frequency and Duration  Prognosis Prognosis for Safe Diet Advancement: Good Barriers to Reach Goals: Time post onset      Swallow Study   General Date of Onset: 05/28/21 HPI: 70 yo female with obesity, CVA with expressive aphasia and right hemiplegia, seizures, hypothyroidism, roux-en-y being admitted with AMS, constipation / vomiting.     She has chronic constipation and CT of abdomen shows stool throughout  colon and stool balls in rectum suggestive of Ogilvie's Syndrome. Type of Study: Bedside Swallow Evaluation Previous Swallow Assessment: prior BSE; mech soft thin liquid recommendations Diet Prior to this Study: NPO Temperature Spikes Noted: No Respiratory Status: Room air History of Recent Intubation: No Behavior/Cognition: Alert;Cooperative;Doesn't follow directions (expressive and receptive aphasia limits ability for pt to follow commands) Oral Cavity Assessment: Dry (lingual coating) Oral Care Completed by SLP: Yes Oral Cavity - Dentition: Adequate natural dentition;Poor condition Vision: Functional for self-feeding Self-Feeding Abilities: Needs assist Patient Positioning: Upright in bed Baseline Vocal Quality: Normal Volitional Cough: Cognitively unable to elicit Volitional Swallow: Unable to elicit    Oral/Motor/Sensory Function Overall Oral Motor/Sensory Function: Other (comment) (unable to follow commands for formal assessment)   Ice Chips Ice chips: Within functional limits Presentation: Spoon   Thin Liquid Thin Liquid: Impaired Presentation: Straw;Cup (unable to syphoon for straw use due to likely oral apraxia) Oral Phase Impairments: Reduced labial seal;Reduced lingual movement/coordination    Nectar Thick Nectar Thick Liquid: Not tested   Honey Thick Honey Thick Liquid: Not tested   Puree Puree: Within functional limits Presentation: Spoon   Solid     Solid: Impaired Presentation: Spoon Oral Phase Impairments: Reduced lingual movement/coordination Oral Phase Functional Implications: Prolonged oral transit      Ardyth Gal MA, CCC-SLP Acute Rehabilitation Services   05/29/2021,4:25 PM

## 2021-05-29 NOTE — ED Notes (Signed)
Pt [place] removed to assess on RA. Pt 98% RA

## 2021-05-30 ENCOUNTER — Observation Stay (HOSPITAL_COMMUNITY): Payer: Medicare HMO

## 2021-05-30 DIAGNOSIS — R109 Unspecified abdominal pain: Secondary | ICD-10-CM | POA: Diagnosis not present

## 2021-05-30 DIAGNOSIS — K5900 Constipation, unspecified: Secondary | ICD-10-CM

## 2021-05-30 DIAGNOSIS — K5981 Ogilvie syndrome: Secondary | ICD-10-CM | POA: Diagnosis not present

## 2021-05-30 LAB — BASIC METABOLIC PANEL
Anion gap: 6 (ref 5–15)
BUN: 15 mg/dL (ref 8–23)
CO2: 28 mmol/L (ref 22–32)
Calcium: 8.5 mg/dL — ABNORMAL LOW (ref 8.9–10.3)
Chloride: 101 mmol/L (ref 98–111)
Creatinine, Ser: 0.62 mg/dL (ref 0.44–1.00)
GFR, Estimated: >60 mL/min (ref >60)
Glucose, Bld: 78 mg/dL (ref 70–99)
Potassium: 3.1 mmol/L — ABNORMAL LOW (ref 3.5–5.1)
Sodium: 135 mmol/L (ref 135–145)

## 2021-05-30 LAB — CBC
HCT: 39.1 % (ref 36.0–46.0)
Hemoglobin: 12.7 g/dL (ref 12.0–15.0)
MCH: 30 pg (ref 26.0–34.0)
MCHC: 32.5 g/dL (ref 30.0–36.0)
MCV: 92.4 fL (ref 80.0–100.0)
Platelets: 182 10*3/uL (ref 150–400)
RBC: 4.23 MIL/uL (ref 3.87–5.11)
RDW: 13.2 % (ref 11.5–15.5)
WBC: 8.4 10*3/uL (ref 4.0–10.5)
nRBC: 0 % (ref 0.0–0.2)

## 2021-05-30 LAB — GLUCOSE, CAPILLARY
Glucose-Capillary: 74 mg/dL (ref 70–99)
Glucose-Capillary: 130 mg/dL — ABNORMAL HIGH (ref 70–99)
Glucose-Capillary: 59 mg/dL — ABNORMAL LOW (ref 70–99)
Glucose-Capillary: 97 mg/dL (ref 70–99)

## 2021-05-30 LAB — HEMOGLOBIN A1C
Hgb A1c MFr Bld: 5.5 % (ref 4.8–5.6)
Mean Plasma Glucose: 111.15 mg/dL

## 2021-05-30 LAB — TSH: TSH: 0.885 u[IU]/mL (ref 0.350–4.500)

## 2021-05-30 LAB — HIV ANTIBODY (ROUTINE TESTING W REFLEX): HIV Screen 4th Generation wRfx: NONREACTIVE

## 2021-05-30 MED ORDER — POLYETHYLENE GLYCOL 3350 17 G PO PACK
17.0000 g | PACK | Freq: Every day | ORAL | Status: DC
  Administered 2021-05-30 – 2021-06-01 (×3): 17 g via ORAL
  Filled 2021-05-30 (×3): qty 1

## 2021-05-30 MED ORDER — POTASSIUM CHLORIDE CRYS ER 20 MEQ PO TBCR
40.0000 meq | EXTENDED_RELEASE_TABLET | Freq: Once | ORAL | Status: AC
  Administered 2021-05-30: 40 meq via ORAL
  Filled 2021-05-30: qty 2

## 2021-05-30 NOTE — Progress Notes (Signed)
PROGRESS NOTE  Kathleen Jensen  DOB: 11-Sep-1950  PCP: Donato Schultz, DO IOX:[id number]  DOA: 05/28/2021  LOS: 0 days  Hospital Day: 3  Chief Complaint  Patient presents with   Abdominal Pain   Brief narrative: Kathleen Jensen is a 70 y.o. 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 Patient was sent to the ED from nursing home on 10/31 with complaint of worsening abdominal pain, constipation and inability to tolerate oral intake for 1 week.  She was unsure when her last bowel movement was.  In the ED, patient was hemodynamically stable. Manual disimpaction was tried without success. Admitted to hospital service GI consultation was obtained. CT abdomen pelvis showed large burden burden of stool throughout the colon and rectum, stool balls in the rectum.  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  Small air-fluid level in the urinary bladder.  Correlate for recent catheterization.  Status post Roux type gastric bypass. In ED patient made n.p.o., given 1.5 normal saline bolus. Attempted manual disimpaction.  General surgery and GI were consulted and [facility] was asked to admit.  Subjective: Patient was seen and examined this morning.  Pleasant elderly Caucasian female.  Opens eyes on verbal command.  Seems to have underlying cognitive impairment.  Unable to answer questions appropriately.  Not in distress at the time of my evaluation.  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  Currently on liquid diet, MiraLAX, tapwater enema.  Continue to monitor daily KUBs.   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   -Encourage oral intake. -Speech therapy evaluation obtained.  Hypokalemia -Secondary to poor oral intake.  Obtain magnesium and phosphorus level as well tomorrow Recent Labs  Lab 05/28/21 1450 05/29/21 0904 05/30/21 0252  K 4.3 4.0 3.1*   Type 2 diabetes mellitus Hypoglycemia -A1c 5.5 in 11/2.  Had an event of hypoglycemia with sugar level 69 yesterday -Currently on sliding scale insulin. Recent Labs  Lab 05/29/21 1709 05/29/21 1939 05/30/21 0832 05/30/21 1120 05/30/21 1559  GLUCAP 69* 86 74 130* 59*   History of CVA with right hemiplegia and aphasia  Hyperlipidemia -at baseline -SLP swallow eval -Unclear why patient is not on any antiplatelet or anticoagulant.  Continue simvastatin  Essential hypertension -Continue losartan/HCTZ.  Seizures Anxiety/depression -seizure precautions -continue AED: kepra, dilantin, depakene, Lyrica, Cymbalta, baclofen    Hypothyroidism -continue synthroid    OSA -on no cpap or oxygen at night    Mobility: Unclear baseline mobility status. Living condition: Nursing home Goals of care:   Code Status: Full Code  Nutritional status: Body mass index is 32.62 kg/m.     Diet:  Diet Order             Diet clear liquid Room service appropriate? Yes; Fluid consistency: Thin  Diet effective now                  DVT prophylaxis: Lovenox subcu enoxaparin (LOVENOX) injection 40 mg Start: 05/29/21 1515   Antimicrobials: None Fluid: None Consultants: GI Family Communication: None at bedside  Status is: Observation  Remains  inpatient appropriate because: Continues to have constipation.  Bowel regimen planned  Dispo: The patient is from: Nursing home              Anticipated d/c is to: Back to nursing home               Patient currently is not medically stable to d/c.   Difficult to place patient No     Infusions:    Scheduled Meds:  baclofen  5 mg Oral BID   DULoxetine  30 mg Oral Daily   enoxaparin (LOVENOX) injection  40 mg Subcutaneous Q24H   hydrochlorothiazide  12.5 mg Oral Daily   insulin aspart  0-9 Units Subcutaneous TID WC   levETIRAcetam  2,000 mg Oral BID   levothyroxine  75 mcg Oral QAC breakfast   lidocaine  1 patch Transdermal Q24H   losartan  50 mg Oral Daily   multivitamin  1 tablet Oral Daily   nystatin  5 mL Oral QID   phenytoin  100 mg Oral Daily   phenytoin  150 mg Oral BID   polyethylene glycol  17 g Oral Daily   pregabalin  50 mg Oral QHS   valproic acid  500 mg Oral QID    PRN meds: acetaminophen **OR** acetaminophen   Antimicrobials: Anti-infectives (From admission, onward)    None       Objective: Vitals:   05/30/21 0724 05/30/21 1503  BP: (!) 182/99 (!) 127/93  Pulse: 60 65  Resp: 17 17  Temp: (!) 97.5 F (36.4 C) (!) 97 F (36.1 C)  SpO2: 96% 99%    Intake/Output Summary (Last 24 hours) at 05/30/2021 1615 Last data filed at 05/29/2021 1753 Gross per 24 hour  Intake --  Output 200 ml  Net -200 ml   Filed Weights   05/29/21 2254  Weight: 78.3 kg   Weight change:  Body mass index is 32.62 kg/m.   Physical Exam: General exam: Pleasant, elderly Caucasian female.  Not in physical distress Skin: No rashes, lesions or ulcers. HEENT: Atraumatic, normocephalic, no obvious bleeding Lungs: Clear to auscultation bilaterally CVS: Regular rate and rhythm, no murmur GI/Abd soft, nontender, nondistended, bowel sound present CNS: Opens eyes on verbal command, unable to have a conversation.  Baseline aphasia due to previous stroke Psychiatry: Sad affect Extremities: No pedal edema, no calf tenderness  Data Review: I have personally reviewed the laboratory data and studies available.  F/u labs ordered Unresulted Labs (From admission,  onward)     Start     Ordered   05/31/21 0500  CBC with Differential/Platelet  Daily,   R      05/30/21 1615   05/31/21 0500  Basic metabolic panel  Daily,   R      05/30/21 1615   05/28/21 1449  Urinalysis, Routine w reflex microscopic Urine, Clean Catch  Once,   STAT        05/28/21 1449            Signed, Lorin Glass, MD [facility] 05/30/2021

## 2021-05-30 NOTE — Progress Notes (Signed)
Hypoglycemic Event  CBG: 59  Treatment: 4 oz juice/soda  Symptoms: None  Follow-up CBG: Time:1709 CBG Result:97  Possible Reasons for Event: Inadequate meal intake  Comments/MD notified:Dr. Dahal made aware    Kathleen Jensen

## 2021-05-31 ENCOUNTER — Observation Stay (HOSPITAL_COMMUNITY): Payer: Medicare HMO

## 2021-05-31 DIAGNOSIS — I69319 Unspecified symptoms and signs involving cognitive functions following cerebral infarction: Secondary | ICD-10-CM | POA: Diagnosis not present

## 2021-05-31 DIAGNOSIS — E1122 Type 2 diabetes mellitus with diabetic chronic kidney disease: Secondary | ICD-10-CM | POA: Diagnosis present

## 2021-05-31 DIAGNOSIS — I129 Hypertensive chronic kidney disease with stage 1 through stage 4 chronic kidney disease, or unspecified chronic kidney disease: Secondary | ICD-10-CM | POA: Diagnosis present

## 2021-05-31 DIAGNOSIS — R109 Unspecified abdominal pain: Secondary | ICD-10-CM | POA: Diagnosis not present

## 2021-05-31 DIAGNOSIS — Z9884 Bariatric surgery status: Secondary | ICD-10-CM | POA: Diagnosis not present

## 2021-05-31 DIAGNOSIS — F419 Anxiety disorder, unspecified: Secondary | ICD-10-CM | POA: Diagnosis present

## 2021-05-31 DIAGNOSIS — Z885 Allergy status to narcotic agent status: Secondary | ICD-10-CM | POA: Diagnosis not present

## 2021-05-31 DIAGNOSIS — E785 Hyperlipidemia, unspecified: Secondary | ICD-10-CM | POA: Diagnosis present

## 2021-05-31 DIAGNOSIS — G40909 Epilepsy, unspecified, not intractable, without status epilepticus: Secondary | ICD-10-CM | POA: Diagnosis present

## 2021-05-31 DIAGNOSIS — I69391 Dysphagia following cerebral infarction: Secondary | ICD-10-CM | POA: Diagnosis not present

## 2021-05-31 DIAGNOSIS — K5900 Constipation, unspecified: Secondary | ICD-10-CM | POA: Diagnosis present

## 2021-05-31 DIAGNOSIS — Z6832 Body mass index (BMI) 32.0-32.9, adult: Secondary | ICD-10-CM | POA: Diagnosis not present

## 2021-05-31 DIAGNOSIS — G4733 Obstructive sleep apnea (adult) (pediatric): Secondary | ICD-10-CM | POA: Diagnosis present

## 2021-05-31 DIAGNOSIS — E039 Hypothyroidism, unspecified: Secondary | ICD-10-CM | POA: Diagnosis present

## 2021-05-31 DIAGNOSIS — E669 Obesity, unspecified: Secondary | ICD-10-CM | POA: Diagnosis present

## 2021-05-31 DIAGNOSIS — K5981 Ogilvie syndrome: Secondary | ICD-10-CM | POA: Diagnosis not present

## 2021-05-31 DIAGNOSIS — B370 Candidal stomatitis: Secondary | ICD-10-CM | POA: Diagnosis present

## 2021-05-31 DIAGNOSIS — F32A Depression, unspecified: Secondary | ICD-10-CM | POA: Diagnosis present

## 2021-05-31 DIAGNOSIS — L89151 Pressure ulcer of sacral region, stage 1: Secondary | ICD-10-CM | POA: Diagnosis present

## 2021-05-31 DIAGNOSIS — I69351 Hemiplegia and hemiparesis following cerebral infarction affecting right dominant side: Secondary | ICD-10-CM | POA: Diagnosis not present

## 2021-05-31 DIAGNOSIS — G8191 Hemiplegia, unspecified affecting right dominant side: Secondary | ICD-10-CM | POA: Diagnosis not present

## 2021-05-31 DIAGNOSIS — K5909 Other constipation: Secondary | ICD-10-CM | POA: Diagnosis present

## 2021-05-31 DIAGNOSIS — E876 Hypokalemia: Secondary | ICD-10-CM | POA: Diagnosis present

## 2021-05-31 DIAGNOSIS — Z86711 Personal history of pulmonary embolism: Secondary | ICD-10-CM | POA: Diagnosis not present

## 2021-05-31 DIAGNOSIS — E11649 Type 2 diabetes mellitus with hypoglycemia without coma: Secondary | ICD-10-CM | POA: Diagnosis not present

## 2021-05-31 DIAGNOSIS — Z515 Encounter for palliative care: Secondary | ICD-10-CM | POA: Diagnosis not present

## 2021-05-31 DIAGNOSIS — R1312 Dysphagia, oropharyngeal phase: Secondary | ICD-10-CM | POA: Diagnosis present

## 2021-05-31 DIAGNOSIS — I69320 Aphasia following cerebral infarction: Secondary | ICD-10-CM | POA: Diagnosis not present

## 2021-05-31 LAB — CBC WITH DIFFERENTIAL/PLATELET
Abs Immature Granulocytes: 0.02 10*3/uL (ref 0.00–0.07)
Basophils Absolute: 0 10*3/uL (ref 0.0–0.1)
Basophils Relative: 0 %
Eosinophils Absolute: 0.1 10*3/uL (ref 0.0–0.5)
Eosinophils Relative: 2 %
HCT: 41.4 % (ref 36.0–46.0)
Hemoglobin: 14.1 g/dL (ref 12.0–15.0)
Immature Granulocytes: 0 %
Lymphocytes Relative: 49 %
Lymphs Abs: 3.7 10*3/uL (ref 0.7–4.0)
MCH: 31 pg (ref 26.0–34.0)
MCHC: 34.1 g/dL (ref 30.0–36.0)
MCV: 91 fL (ref 80.0–100.0)
Monocytes Absolute: 0.8 10*3/uL (ref 0.1–1.0)
Monocytes Relative: 11 %
Neutro Abs: 2.9 10*3/uL (ref 1.7–7.7)
Neutrophils Relative %: 38 %
Platelets: 171 10*3/uL (ref 150–400)
RBC: 4.55 MIL/uL (ref 3.87–5.11)
RDW: 13.1 % (ref 11.5–15.5)
WBC: 7.5 10*3/uL (ref 4.0–10.5)
nRBC: 0 % (ref 0.0–0.2)

## 2021-05-31 LAB — GLUCOSE, CAPILLARY
Glucose-Capillary: 70 mg/dL (ref 70–99)
Glucose-Capillary: 103 mg/dL — ABNORMAL HIGH (ref 70–99)
Glucose-Capillary: 108 mg/dL — ABNORMAL HIGH (ref 70–99)
Glucose-Capillary: 81 mg/dL (ref 70–99)

## 2021-05-31 LAB — BASIC METABOLIC PANEL
Anion gap: 7 (ref 5–15)
BUN: 11 mg/dL (ref 8–23)
CO2: 26 mmol/L (ref 22–32)
Calcium: 8.4 mg/dL — ABNORMAL LOW (ref 8.9–10.3)
Chloride: 102 mmol/L (ref 98–111)
Creatinine, Ser: 0.7 mg/dL (ref 0.44–1.00)
GFR, Estimated: >60 mL/min (ref >60)
Glucose, Bld: 90 mg/dL (ref 70–99)
Potassium: 3.6 mmol/L (ref 3.5–5.1)
Sodium: 135 mmol/L (ref 135–145)

## 2021-05-31 NOTE — Progress Notes (Addendum)
PROGRESS NOTE  Kathleen Jensen  DOB: 27-Jul-1951  PCP: Donato Schultz, DO WPY:[id number]  DOA: 05/28/2021  LOS: 0 days  Hospital Day: 4  Chief Complaint  Patient presents with   Abdominal Pain   Brief narrative: Kathleen Jensen is a 70 y.o. 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 Patient was sent to the ED from nursing home on 10/31 with complaint of worsening abdominal pain, constipation and inability to tolerate oral intake for 1 week.  She was unsure when her last bowel movement was.  In the ED, patient was hemodynamically stable. Manual disimpaction was tried without success. Admitted to hospital service GI consultation was obtained. CT abdomen pelvis showed large burden burden of stool throughout the colon and rectum, stool balls in the rectum.  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  Small air-fluid level in the urinary bladder.  Correlate for recent catheterization.  Status post Roux type gastric bypass. In ED patient made n.p.o., given 1.5 normal saline bolus. Attempted manual disimpaction.  General surgery and GI were consulted and [facility] was asked to admit.  Subjective: Patient was seen and examined this morning.   Pleasant.  Lying in bed.  Complains of pain everywhere including her back, stomach and legs.  Able to answer some questions today.  Family not at bedside. Per nursing staff, she had a large bowel movement yesterday.  Abdominal x-ray this morning continues to show large amount of stool. Poor oral intake.  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  Currently on liquid diet, MiraLAX, tapwater enema.   -Patient had a large bowel movement yesterday but KUB this morning continues to show large amount of stool.  Continue the plan today with MiraLAX.     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   -Patient continues to have poor oral intake.  Remains on clear liquid diet per GI recommendation.  Encourage oral intake. -Speech therapy evaluation obtained.  Hypokalemia -Secondary to poor oral intake.  Improved with replacement. Recent Labs  Lab 05/28/21 1450 05/29/21 0904 05/30/21 0252 05/31/21 0238  K 4.3 4.0 3.1* 3.6   Type 2 diabetes mellitus Hypoglycemia -A1c 5.5 in 11/2.  Had an event of hypoglycemia with sugar level 59 yesterday -Currently on sliding scale insulin. Recent Labs  Lab 05/30/21 1120 05/30/21 1559 05/30/21 1709 05/31/21 0644 05/31/21 1129  GLUCAP 130* 59* 97 70 103*   History of CVA with right hemiplegia and aphasia  Hyperlipidemia -at baseline, patient has right hemiplegia, aphasia and mild cognitive disorder as well. -SLP swallow eval was obtained. -Unclear why patient is not on any antiplatelet or anticoagulant.  Need to be addressed as an outpatient.  Continue simvastatin  Essential hypertension -Continue losartan/HCTZ.  Seizures Anxiety/depression -seizure precautions -continue AED: kepra, dilantin, depakene, Lyrica, Cymbalta, baclofen    Hypothyroidism -continue synthroid    OSA -on no cpap or oxygen at night   Impaired mobility -Unclear baseline mobility status at the facility.  Patient has been in bed for last 3 days.  Obtain PT eval.  Request out of bed.  May  help with her complaint of generalized malaise.  Her PTA list does not show any opioids.  I would avoid them at this time.  Tylenol for pain  control   Mobility: Unclear baseline mobility status.  Obtain PT eval Living condition: Nursing home Goals of care:   Code Status: Full Code  Nutritional status: Body mass index is 32.62 kg/m.     Diet:  Diet Order             Diet clear liquid Room service appropriate? Yes; Fluid consistency: Thin  Diet effective now                  DVT prophylaxis: Lovenox subcu enoxaparin (LOVENOX) injection 40 mg Start: 05/29/21 1515   Antimicrobials: None Fluid: None Consultants: GI Family Communication: None at bedside  Status is: Observation  Remains inpatient appropriate because: Continues to have constipation.  Bowel regimen to continue  Dispo: The patient is from: Nursing home              Anticipated d/c is to: Back to nursing home              Patient currently is not medically stable to d/c.   Difficult to place patient No     Infusions:    Scheduled Meds:  baclofen  5 mg Oral BID   DULoxetine  30 mg Oral Daily   enoxaparin (LOVENOX) injection  40 mg Subcutaneous Q24H   hydrochlorothiazide  12.5 mg Oral Daily   insulin aspart  0-9 Units Subcutaneous TID WC   levETIRAcetam  2,000 mg Oral BID   levothyroxine  75 mcg Oral QAC breakfast   lidocaine  1 patch Transdermal Q24H   losartan  50 mg Oral Daily   multivitamin  1 tablet Oral Daily   nystatin  5 mL Oral QID   phenytoin  100 mg Oral Daily   phenytoin  150 mg Oral BID   polyethylene glycol  17 g Oral Daily   pregabalin  50 mg Oral QHS   valproic acid  500 mg Oral QID    PRN meds: acetaminophen **OR** acetaminophen   Antimicrobials: Anti-infectives (From admission, onward)    None       Objective: Vitals:   05/30/21 2057 05/31/21 0744  BP: (!) 136/98 132/89  Pulse: (!) 59 62  Resp: 16 17  Temp: 98.2 F (36.8 C) 97.8 F (36.6 C)  SpO2: 98% 98%    Intake/Output Summary (Last 24 hours) at 05/31/2021 1308 Last data filed at 05/30/2021 1900 Gross per 24 hour  Intake 120 ml  Output --   Net 120 ml   Filed Weights   05/29/21 2254  Weight: 78.3 kg   Weight change:  Body mass index is 32.62 kg/m.   Physical Exam: General exam: Pleasant, elderly Caucasian female.  Mild physical distress because of 'pain everywhere.' Skin: No rashes, lesions or ulcers. HEENT: Atraumatic, normocephalic, no obvious bleeding Lungs: Clear to auscultation bilaterally CVS: Regular rate and rhythm, no murmur GI/Abd soft, nontender, nondistended, bowel sound present CNS: Alert, awake, able to have simple conversation today.  Oriented to place Psychiatry: Sad affect Extremities: No pedal edema, no calf tenderness  Data Review: I have personally reviewed the laboratory data and studies available.  F/u labs ordered Unresulted Labs (From admission, onward)     Start     Ordered   05/31/21 0500  CBC with Differential/Platelet  Daily,   R      05/30/21 1615  05/31/21 0500  Basic metabolic panel  Daily,   R      05/30/21 1615   05/28/21 1449  Urinalysis, Routine w reflex microscopic Urine, Clean Catch  Once,   STAT        05/28/21 1449            Signed, Lorin Glass, MD [facility] 05/31/2021

## 2021-05-31 NOTE — TOC Progression Note (Signed)
Transition of Care [facility]) - Initial/Assessment Note    Patient Details  Name: Kathleen Jensen MRN: [id number] Date of Birth: [birth year]  Transition of Care [facility]) CM/SW Contact:    Ralene Bathe, LCSWA Phone Number: 05/31/2021, 3:42 PM  Clinical Narrative:                  CSW called Star with admissions at [facility] and verified that the patient is a long term resident at the facility and can return when medically ready.       Patient Goals and CMS Choice        Expected Discharge Plan and Services                                                Prior Living Arrangements/Services                       Activities of Daily Living      Permission Sought/Granted                  Emotional Assessment              Admission diagnosis:  Constipation, unspecified constipation type [K59.00] Ogilvie syndrome [K59.81] Patient Active Problem List   Diagnosis Date Noted   Constipation    Abdominal pain    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   Right hemiplegia (HCC) 04/12/2020   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   Hyperlipidemia associated with type 2 diabetes mellitus (HCC) 07/30/2018   History of cerebral infarction 07/30/2018   Vitamin B 12 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/30/2018   Asthma 03/13/2018   Seizures (HCC) 01/12/2018   CVA (cerebral vascular accident) (HCC) 12/08/2017   Abnormal thyroid blood test 12/08/2017   Pulmonary embolism and infarction (HCC) 12/08/2017   Anemia 12/08/2017   Expressive aphasia 10/15/2017   Seizure disorder (HCC) 10/15/2017   Dysphagia 10/15/2017   Diabetes mellitus without complication (HCC)    Hyperlipidemia 09/22/2017   Urinary frequency 09/22/2017   Airway hyperreactivity 01/26/2016   Disease of thyroid gland 01/26/2016   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   Other specified postprocedural states 01/26/2016   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   Chronic LBP 04/10/2015    Degeneration of intervertebral disc of lumbar region 04/10/2015   Spondylolisthesis of lumbar region 04/10/2015   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   History of cardiac pacemaker in situ 01/09/2015   B12 DEFICIENCY 08/15/2008   UNSPECIFIED VITAMIN D DEFICIENCY 08/15/2008   SINUS BRADYCARDIA 08/15/2008   OBSTRUCTIVE SLEEP APNEA 08/11/2008   INSOMNIA 04/28/2008   Hyperlipidemia LDL goal <70 12/12/2006   RHINITIS, ALLERGIC NEC 12/12/2006   Hypothyroidism 12/11/2006   Essential hypertension 12/11/2006   GERD 12/11/2006   STRESS INCONTINENCE 12/11/2006   PCP:  Donato Schultz, DO Pharmacy:   [facility] - [place], [place] - [address] [place] [address] Phone: [phone] Fax: [phone]  [facility] [address] [place] [address] Phone: [phone] Fax: [phone]     Social Determinants of Health (SDOH) Interventions    Readmission Risk Interventions No flowsheet data found.

## 2021-05-31 NOTE — Progress Notes (Addendum)
Gastroenterology Inpatient Follow Up    Subjective: Having a small amount of abdominal pain. Had a large BM yesterday per nursing staff.   Objective: Vital signs in last 24 hours: Temp:  [97 F (36.1 C)-98.2 F (36.8 C)] 97.8 F (36.6 C) (11/03 0744) Pulse Rate:  [59-65] 62 (11/03 0744) Resp:  [16-17] 17 (11/03 0744) BP: (127-136)/(89-98) 132/89 (11/03 0744) SpO2:  [98 %-99 %] 98 % (11/03 0744) Last BM Date: 05/30/21  Intake/Output from previous day: 11/02 0701 - 11/03 0700 In: 120 [P.O.:120] Out: -  Intake/Output this shift: No intake/output data recorded.  General appearance: alert and cooperative Resp: clear to auscultation bilaterally Cardio: regular rate GI: mild diffuse tenderness, bowel sounds present Extremities: extremities normal, atraumatic, no cyanosis or edema  Lab Results: Recent Labs    05/29/21 0904 05/30/21 0252 05/31/21 0238  WBC 10.8* 8.4 7.5  HGB 13.8 12.7 14.1  HCT 43.5 39.1 41.4  PLT 204 182 171   BMET Recent Labs    05/29/21 0904 05/30/21 0252 05/31/21 0238  NA 137 135 135  K 4.0 3.1* 3.6  CL 99 101 102  CO2 30 28 26   GLUCOSE 91 78 90  BUN 24* 15 11  CREATININE 0.87 0.62 0.70  CALCIUM 8.9 8.5* 8.4*   LFT Recent Labs    05/29/21 0904  PROT 8.0  ALBUMIN 3.2*  AST 44*  ALT 31  ALKPHOS 60  BILITOT 0.7   PT/INR No results for input(s): LABPROT, INR in the last 72 hours. Hepatitis Panel No results for input(s): HEPBSAG, HCVAB, HEPAIGM, HEPBIGM in the last 72 hours. C-Diff No results for input(s): CDIFFTOX in the last 72 hours.  Studies/Results: DG Abd 1 View  Result Date: 05/31/2021 CLINICAL DATA:  70 year old female with abdominal pain. EXAM: ABDOMEN - 1 VIEW COMPARISON:  CT Abdomen and Pelvis 05/29/2021. KUB 05/30/2021 and earlier. FINDINGS: AP supine views at 0655 hours. Bowel-gas pattern has not significantly changed since the CT on 05/29/2021, although the sigmoid is less distended with gas now. Continued  retained stool. No dilated small bowel. Negative lung bases. Cardiac pacemaker leads. Stable cholecystectomy clips. Chronic lumbar fusion. No acute osseous abnormality identified. IMPRESSION: Bowel-gas pattern and retained stool not significantly changed since 05/29/2021, although the sigmoid is less distended with gas. No dilated small bowel to suggest mechanical obstruction. Electronically Signed   By: 13/07/2020 M.D.   On: 05/31/2021 07:41   DG Abd 1 View  Result Date: 05/30/2021 CLINICAL DATA:  Chronic constipation EXAM: ABDOMEN - 1 VIEW COMPARISON:  CT 05/29/2021 FINDINGS: Colon is distended with air and stool. Postoperative changes the upper abdomen. Lower lumbar postoperative changes. IMPRESSION: Distention of colon with air and stool similar to prior CT. Electronically Signed   By: 13/07/2020 M.D.   On: 05/30/2021 08:45    Medications: Scheduled:  baclofen  5 mg Oral BID   DULoxetine  30 mg Oral Daily   enoxaparin (LOVENOX) injection  40 mg Subcutaneous Q24H   hydrochlorothiazide  12.5 mg Oral Daily   insulin aspart  0-9 Units Subcutaneous TID WC   levETIRAcetam  2,000 mg Oral BID   levothyroxine  75 mcg Oral QAC breakfast   lidocaine  1 patch Transdermal Q24H   losartan  50 mg Oral Daily   multivitamin  1 tablet Oral Daily   nystatin  5 mL Oral QID   phenytoin  100 mg Oral Daily   phenytoin  150 mg Oral BID   polyethylene glycol  17 g Oral Daily   pregabalin  50 mg Oral QHS   valproic acid  500 mg Oral QID   Continuous: XAJ:OINOMVEHMCNOB **OR** acetaminophen  Assessment/Plan: 70 year old female with CVA c/b expressive aphasia and right hemiplegia, seizures, CKD, RYGB, s/p pacemaker, and chronic constipation presented with nausea, vomiting, and constipation. CT upon arrival shows significant stool burden with dilation in the sigmoid colon concerning for chronic intestinal pseudo-obstruction or chronic constipation. Speech evaluation was done and showed that she has mild  oropharyngeal dysphagia suspected to be due to prior stroke. On nystatin swish and swallow for thrush. She is able to tolerate liquids. Having some BMs. KUB today shows some less gas in the sigmoid colon.  - Continue Miralax and tap water enemas - Daily KUBs - Keep K>4 - Roll patient regularly or try to have patient work with PT to encourage passage of stool - Avoid medications that interfere with bowel motility    LOS: 0 days   Kathleen Jensen 05/31/2021, 12:07 PM

## 2021-06-01 ENCOUNTER — Inpatient Hospital Stay (HOSPITAL_COMMUNITY): Payer: Medicare HMO

## 2021-06-01 DIAGNOSIS — K5981 Ogilvie syndrome: Secondary | ICD-10-CM | POA: Diagnosis not present

## 2021-06-01 LAB — CBC WITH DIFFERENTIAL/PLATELET
Abs Immature Granulocytes: 0.2 10*3/uL — ABNORMAL HIGH (ref 0.00–0.07)
Basophils Absolute: 0.1 10*3/uL (ref 0.0–0.1)
Basophils Relative: 1 %
Eosinophils Absolute: 0.1 10*3/uL (ref 0.0–0.5)
Eosinophils Relative: 1 %
HCT: 45.1 % (ref 36.0–46.0)
Hemoglobin: 15.1 g/dL — ABNORMAL HIGH (ref 12.0–15.0)
Immature Granulocytes: 2 %
Lymphocytes Relative: 44 %
Lymphs Abs: 5.1 10*3/uL — ABNORMAL HIGH (ref 0.7–4.0)
MCH: 30.4 pg (ref 26.0–34.0)
MCHC: 33.5 g/dL (ref 30.0–36.0)
MCV: 90.7 fL (ref 80.0–100.0)
Monocytes Absolute: 1.1 10*3/uL — ABNORMAL HIGH (ref 0.1–1.0)
Monocytes Relative: 9 %
Neutro Abs: 4.9 10*3/uL (ref 1.7–7.7)
Neutrophils Relative %: 43 %
Platelets: 168 10*3/uL (ref 150–400)
RBC: 4.97 MIL/uL (ref 3.87–5.11)
RDW: 13.2 % (ref 11.5–15.5)
WBC: 11.6 10*3/uL — ABNORMAL HIGH (ref 4.0–10.5)
nRBC: 0 % (ref 0.0–0.2)

## 2021-06-01 LAB — BASIC METABOLIC PANEL
Anion gap: 10 (ref 5–15)
BUN: 11 mg/dL (ref 8–23)
CO2: 23 mmol/L (ref 22–32)
Calcium: 8.7 mg/dL — ABNORMAL LOW (ref 8.9–10.3)
Chloride: 102 mmol/L (ref 98–111)
Creatinine, Ser: 0.66 mg/dL (ref 0.44–1.00)
GFR, Estimated: >60 mL/min (ref >60)
Glucose, Bld: 85 mg/dL (ref 70–99)
Potassium: 4.4 mmol/L (ref 3.5–5.1)
Sodium: 135 mmol/L (ref 135–145)

## 2021-06-01 LAB — GLUCOSE, CAPILLARY
Glucose-Capillary: 77 mg/dL (ref 70–99)
Glucose-Capillary: 87 mg/dL (ref 70–99)
Glucose-Capillary: 106 mg/dL — ABNORMAL HIGH (ref 70–99)
Glucose-Capillary: 99 mg/dL (ref 70–99)

## 2021-06-01 MED ORDER — BISACODYL 5 MG PO TBEC
20.0000 mg | DELAYED_RELEASE_TABLET | Freq: Once | ORAL | Status: AC
  Administered 2021-06-01: 20 mg via ORAL
  Filled 2021-06-01: qty 4

## 2021-06-01 MED ORDER — BISACODYL 5 MG PO TBEC
10.0000 mg | DELAYED_RELEASE_TABLET | Freq: Every day | ORAL | Status: DC
  Administered 2021-06-02 – 2021-06-06 (×5): 10 mg via ORAL
  Filled 2021-06-01 (×4): qty 2

## 2021-06-01 MED ORDER — POLYETHYLENE GLYCOL 3350 17 G PO PACK: 17.0000 g | PACK | Freq: Two times a day (BID) | ORAL | Status: DC

## 2021-06-01 MED ORDER — POLYETHYLENE GLYCOL 3350 17 G PO PACK
17.0000 g | PACK | Freq: Three times a day (TID) | ORAL | Status: DC
  Administered 2021-06-01 – 2021-06-05 (×11): 17 g via ORAL
  Filled 2021-06-01 (×12): qty 1

## 2021-06-01 NOTE — Progress Notes (Signed)
PROGRESS NOTE  Kathleen Jensen  DOB: 22-Dec-1950  PCP: Donato Schultz, DO ZOX:[id number]  DOA: 05/28/2021  LOS: 1 day  Hospital Day: 5  Chief Complaint  Patient presents with   Abdominal Pain   Brief narrative: Kathleen Jensen is a 70 y.o. 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 Patient was sent to the ED from nursing home on 10/31 with complaint of worsening abdominal pain, constipation and inability to tolerate oral intake for 1 week.  She was unsure when her last bowel movement was.  In the ED, patient was hemodynamically stable. Manual disimpaction was tried without success. Admitted to hospital service GI consultation was obtained. CT abdomen pelvis showed large burden burden of stool throughout the colon and rectum, stool balls in the rectum.  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  Small air-fluid level in the urinary bladder.  Correlate for recent catheterization.  Status post Roux type gastric bypass. In ED patient made n.p.o., given 1.5 normal saline bolus. Attempted manual disimpaction.  General surgery and GI were consulted and [facility] was asked to admit.  Subjective: Patient was seen and examined this morning.   Lying on bed.  Just awake.  Confused, mumbling.  No family at bedside. Q this morning continues to show severe constipation.  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  Currently on liquid diet, MiraLAX, tapwater enema.  KUB this morning continues to show severe  constipation.  Discussed with GI this morning.  Recommended to increase frequency of MiraLAX. -Continue monitor.  Repeat daily tomorrow.   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   -Patient continues to have poor oral intake.  Remains on clear liquid diet per GI recommendation.  Encourage oral intake. -Speech therapy evaluation obtained.  Type 2 diabetes mellitus Hypoglycemia -A1c 5.5 in 11/2.   -Currently on sliding scale insulin.  No report of hypoglycemia in last 24 hours.  Blood sugar level remains less than 100. Recent Labs  Lab 05/31/21 0644 05/31/21 1129 05/31/21 1557 05/31/21 2023 06/01/21 0621  GLUCAP 70 103* 108* 81 77   History of CVA with right hemiplegia and aphasia  Hyperlipidemia -at baseline, patient has right hemiplegia, aphasia and mild cognitive disorder as well. -SLP swallow eval was obtained.  Diet advanced to soft today. -Unclear why patient is not on any antiplatelet or anticoagulant.  Need to be addressed as an outpatient.  Continue simvastatin  Essential hypertension -Continue losartan/HCTZ.  Seizures Anxiety/depression -seizure precautions -continue AED: kepra, dilantin, depakene, Lyrica, Cymbalta, baclofen    Hypothyroidism -continue synthroid    OSA -on no cpap or oxygen at night   Impaired mobility -Minimal mobility.  Requires a lift to mobilize.  PT eval obtained.   -Palliative care consulted for goals of care.     Mobility: Minimal mobility Living condition: Nursing home Goals of care:   Code Status: Full Code  Nutritional status: Body mass index is 32.62 kg/m.     Diet:  Diet Order             DIET DYS 3 Room service appropriate? Yes; Fluid consistency: Thin  Diet effective now                  DVT prophylaxis: Lovenox subcu enoxaparin (LOVENOX) injection 40 mg Start: 05/29/21 1515   Antimicrobials: None Fluid: None Consultants: GI Family  Communication: None at bedside  Status is: Inpatient  Remains inpatient appropriate because: Continues to have severe constipation.  Bowel regimen to continue  Dispo: The patient is from: Nursing home              Anticipated d/c is to: Back to nursing home in 1 to 2 days              Patient currently is not medically stable to d/c.   Difficult to place patient No     Infusions:    Scheduled Meds:  baclofen  5 mg Oral BID   DULoxetine  30 mg Oral Daily   enoxaparin (LOVENOX) injection  40 mg Subcutaneous Q24H   hydrochlorothiazide  12.5 mg Oral Daily   insulin aspart  0-9 Units Subcutaneous TID WC   levETIRAcetam  2,000 mg Oral BID   levothyroxine  75 mcg Oral QAC breakfast   lidocaine  1 patch Transdermal Q24H   losartan  50 mg Oral Daily   multivitamin  1 tablet Oral Daily   nystatin  5 mL Oral QID   phenytoin  100 mg Oral Daily   phenytoin  150 mg Oral BID   polyethylene glycol  17 g Oral BID   pregabalin  50 mg Oral QHS   valproic acid  500 mg Oral QID    PRN meds: acetaminophen **OR** acetaminophen   Antimicrobials: Anti-infectives (From admission, onward)    None       Objective: Vitals:   05/31/21 2025 06/01/21 0819  BP: (!) 150/94 (!) 133/96  Pulse: (!) 59 60  Resp: 16   Temp: 97.8 F (36.6 C) 97.7 F (36.5 C)  SpO2: 95% 97%    Intake/Output Summary (Last 24 hours) at 06/01/2021 1044 Last data filed at 06/01/2021 7425 Gross per 24 hour  Intake --  Output 300 ml  Net -300 ml   Filed Weights   05/29/21 2254  Weight: 78.3 kg   Weight change:  Body mass index is 32.62 kg/m.   Physical Exam: General exam: Pleasant, elderly Caucasian female.  Not in distress Skin: No rashes, lesions or ulcers. HEENT: Atraumatic, normocephalic, no obvious bleeding Lungs: Clear to auscultation bilaterally CVS: Regular rate and rhythm, no murmur GI/Abd soft, nontender, nondistended, bowel sound present CNS: Just awake from sleep.  Mumbling.  Not restless  or agitated. Psychiatry: Sad affect Extremities: No pedal edema, no calf tenderness  Data Review: I have personally reviewed the laboratory data and studies available.  F/u labs ordered Unresulted Labs (From admission, onward)     Start     Ordered   05/31/21 0500  CBC with Differential/Platelet  Daily,   R      05/30/21 1615   05/31/21 0500  Basic metabolic panel  Daily,   R      05/30/21 1615   05/28/21 1449  Urinalysis, Routine w reflex microscopic Urine, Clean Catch  Once,   STAT        05/28/21 1449            Signed, Lorin Glass, MD [facility] 06/01/2021

## 2021-06-01 NOTE — Progress Notes (Addendum)
Daily Rounding Note  06/01/2021, 11:55 AM  LOS: 1 day   SUBJECTIVE:   Chief complaint:    ogilvies, constipation Some bowel movement reported yesterday but not copious.  Passed flatus after tapwater enema.  OBJECTIVE:         Vital signs in last 24 hours:    Temp:  [97.4 F (36.3 C)-97.8 F (36.6 C)] 97.7 F (36.5 C) (11/04 0819) Pulse Rate:  [59-60] 60 (11/04 0819) Resp:  [14-16] 16 (11/03 2025) BP: (114-150)/(75-96) 133/96 (11/04 0819) SpO2:  [95 %-97 %] 97 % (11/04 0819) Last BM Date: 05/30/21 Filed Weights   05/29/21 2254  Weight: 78.3 kg   General: Looks chronically ill.  Resting comfortably in bed Heart: RRR. Chest: No labored breathing or cough. Abdomen: Not tender or distended.  Obese. Rectal: No palpable stool, exam glove clean. Extremities: No pitting edema. Neuro/Psych: Expressive aphasia.  Right hemiparesis.  Not following my commands.  No tremor.  Intake/Output from previous day: 11/03 0701 - 11/04 0700 In: -  Out: 300 [Urine:300]  Intake/Output this shift: No intake/output data recorded.  Lab Results: Recent Labs    05/30/21 0252 05/31/21 0238 06/01/21 0243  WBC 8.4 7.5 11.6*  HGB 12.7 14.1 15.1*  HCT 39.1 41.4 45.1  PLT 182 171 168   BMET Recent Labs    05/30/21 0252 05/31/21 0238 06/01/21 0243  NA 135 135 135  K 3.1* 3.6 4.4  CL 101 102 102  CO2 28 26 23   GLUCOSE 78 90 85  BUN 15 11 11   CREATININE 0.62 0.70 0.66  CALCIUM 8.5* 8.4* 8.7*   LFT No results for input(s): PROT, ALBUMIN, AST, ALT, ALKPHOS, BILITOT, BILIDIR, IBILI in the last 72 hours. PT/INR No results for input(s): LABPROT, INR in the last 72 hours. Hepatitis Panel No results for input(s): HEPBSAG, HCVAB, HEPAIGM, HEPBIGM in the last 72 hours.  Studies/Results: DG Abd 1 View  Result Date: 06/01/2021 CLINICAL DATA:  70 year old female with history of abdominal pain. EXAM: ABDOMEN - 1 VIEW COMPARISON:   Abdominal radiograph 05/31/2021. FINDINGS: In the left mid abdomen there is an isolated loop of small bowel which is slightly prominent measuring 3.3 cm in diameter. No other pathologic dilatation of small bowel. Large amount of stool again noted throughout the entirety of the colon, suggesting constipation. No gross pneumoperitoneum on the supine images. Orthopedic fixation hardware in the lower lumbar spine. Pacemaker leads. IMPRESSION: 1. Nonspecific, nonobstructive bowel gas pattern. Overall, findings are suggestive of severe constipation. Electronically Signed   By: 66 M.D.   On: 06/01/2021 07:42   DG Abd 1 View  Result Date: 05/31/2021 CLINICAL DATA:  70 year old female with abdominal pain. EXAM: ABDOMEN - 1 VIEW COMPARISON:  CT Abdomen and Pelvis 05/29/2021. KUB 05/30/2021 and earlier. FINDINGS: AP supine views at 0655 hours. Bowel-gas pattern has not significantly changed since the CT on 05/29/2021, although the sigmoid is less distended with gas now. Continued retained stool. No dilated small bowel. Negative lung bases. Cardiac pacemaker leads. Stable cholecystectomy clips. Chronic lumbar fusion. No acute osseous abnormality identified. IMPRESSION: Bowel-gas pattern and retained stool not significantly changed since 05/29/2021, although the sigmoid is less distended with gas. No dilated small bowel to suggest mechanical obstruction. Electronically Signed   By: 13/07/2020 M.D.   On: 05/31/2021 07:41    Scheduled Meds:  baclofen  5 mg Oral BID   DULoxetine  30 mg Oral Daily   enoxaparin (LOVENOX)  injection  40 mg Subcutaneous Q24H   hydrochlorothiazide  12.5 mg Oral Daily   insulin aspart  0-9 Units Subcutaneous TID WC   levETIRAcetam  2,000 mg Oral BID   levothyroxine  75 mcg Oral QAC breakfast   lidocaine  1 patch Transdermal Q24H   losartan  50 mg Oral Daily   multivitamin  1 tablet Oral Daily   nystatin  5 mL Oral QID   phenytoin  100 mg Oral Daily   phenytoin  150 mg Oral  BID   polyethylene glycol  17 g Oral BID   pregabalin  50 mg Oral QHS   valproic acid  500 mg Oral QID   Continuous Infusions: PRN Meds:.acetaminophen **OR** acetaminophen   ASSESMENT:   Abdominal pain, nausea, vomiting in setting of constipation, CT suggesting pseudoobstruction.  Current constipation related meds include twice daily MiraLAX.  Today's x-ray shows nonobstructive, nonspecific bowel gas pattern but still has a large amount of stool throughout the colon.  Multiple meds she takes are constipating. Previous colonscopy several yr ago w MD in [facility].    Hypokalemia, resolved.  Hypothyroidism.  TSH normal.    S/p past Roux en Y bypass.      R hemiplegia, aphasia post CVA in past.      PLAN      Not clear patient with tolerated full on bowel prep so ordering large dose Dulcolax, 20 mg now and upping mirlalax to 34 g TID.      No plans for colonoscpy     Jennye Moccasin  06/01/2021, 11:55 AM Phone [phone]

## 2021-06-01 NOTE — Evaluation (Signed)
Physical Therapy Evaluation Patient Details Name: Kathleen Jensen MRN: [id number] DOB: [birth year] Today's Date: 06/01/2021  History of Present Illness  Patient is a 70 y/o female who presents on 05/28/21 with abdominal pain, decreased PO intake and N/V. CT abdomen- large burden burden of stool throughout the colon and rectum, stool balls in the rectum.  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 PMH includes ICH, seizures, right hemiparesis, CKD stage III, expressive aphasia, cognitive communication deficit, history of PE.  Clinical Impression  Patient presents with pain, right hemiparesis, aphasia (expressive>receptive), impaired balance and impaired mobility s/p above. Per chart, pt long term resident of [address]. Pt not able to provide infor regarding PLOF but assume total A for all ADLs and unsure of mobility level. Will try to get a hold of daughter for details. At this time, pt requires Max-total A of 2 for rolling and bed mobility. Able to sit EOB with Mod A with moments of Min guard with UE support but fatigues. Would benefit from nursing using lift to get pt to chair daily as well as pressure relief to offload sore on bottom frequently. Will follow up 1 additional time after chatting with daughter to see if pt is close to functional baseline and will decide if pt is appropriate for skilled rehab.      Recommendations for follow up therapy are one component of a multi-disciplinary discharge planning process, led by the attending physician.  Recommendations may be updated based on patient status, additional functional criteria and insurance authorization.  Follow Up Recommendations Long-term institutional care without follow-up therapy (resident at [facility])    Assistance Recommended at Discharge Frequent or constant Supervision/Assistance  Functional Status Assessment Patient has not had a  recent decline in their functional status  Equipment Recommendations  None recommended by PT    Recommendations for Other Services       Precautions / Restrictions Precautions Precautions: Fall Precaution Comments: right hemiparesis and aphasia Restrictions Weight Bearing Restrictions: No      Mobility  Bed Mobility Overal bed mobility: Needs Assistance Bed Mobility: Rolling;Supine to Sit;Sit to Supine Rolling: Max assist;+2 for physical assistance   Supine to sit: Max assist;+2 for physical assistance;HOB elevated Sit to supine: Max assist;+2 for physical assistance;HOB elevated   General bed mobility comments: Max A of 2 to roll to right/left; Assist of 2 to get to EOB with pt reaching for rail with LUE to assist.    Transfers Overall transfer level: Needs assistance Equipment used: 2 person hand held assist Transfers: Sit to/from Stand Sit to Stand: Max assist;+2 physical assistance           General transfer comment: Unable to stand or bear weight through LEs despite assist of 2.    Ambulation/Gait                Stairs            Wheelchair Mobility    Modified Rankin (Stroke Patients Only) Modified Rankin (Stroke Patients Only) Pre-Morbid Rankin Score: Severe disability Modified Rankin: Severe disability     Balance Overall balance assessment: Needs assistance Sitting-balance support: Feet supported;Single extremity supported Sitting balance-Leahy Scale: Poor Sitting balance - Comments: Able to sit for short periods but fatigues quickly with LOB posteriorly, using LUE on rail at all times. Postural control: Posterior lean  Pertinent Vitals/Pain Pain Assessment: Faces Faces Pain Scale: Hurts whole lot Pain Location: bottom with weight bearing Pain Descriptors / Indicators: Grimacing;Guarding;Discomfort Pain Intervention(s): Monitored during session;Limited activity within patient's  tolerance;Repositioned    Home Living Family/patient expects to be discharged to:: Skilled nursing facility                        Prior Function Prior Level of Function : Patient poor historian/Family not available             Mobility Comments: Unsure of PLOF/historu as pt with aphasia and unable to state, However, per RN (after talking with daughter) sounds like not a lot of mobility performed at SNF as pt is left sitting in stool and also with sore on bottom.       Hand Dominance        Extremity/Trunk Assessment   Upper Extremity Assessment Upper Extremity Assessment: Defer to OT evaluation;RUE deficits/detail;Difficult to assess due to impaired cognition;LUE deficits/detail RUE Deficits / Details: Increased tone in right hand, able to get digits/wrist into extension, placed towel roll for better resting position, flaccid throughout RUE Coordination: decreased fine motor;decreased gross motor LUE Deficits / Details: Limited shoulder elevation/AROM, difficult to fully assess due to language deficits.    Lower Extremity Assessment Lower Extremity Assessment: RLE deficits/detail;Generalized weakness;LLE deficits/detail RLE Deficits / Details: Flaccid throughout LLE Deficits / Details: Grossly ~3/5 knee extension, 2-/5 hip flexion, but difficult to assess fully due to lanugage deficits.    Cervical / Trunk Assessment Cervical / Trunk Assessment: Kyphotic  Communication   Communication: Expressive difficulties;Receptive difficulties  Cognition Arousal/Alertness: Awake/alert Behavior During Therapy: WFL for tasks assessed/performed Overall Cognitive Status: Difficult to assess                                 General Comments: Difficulty following commands due to aphasia, does well with gestural and demonstrational cues.        General Comments      Exercises     Assessment/Plan    PT Assessment Patient needs continued PT services  PT  Problem List Decreased strength;Decreased range of motion;Decreased mobility;Impaired tone;Decreased coordination;Decreased activity tolerance;Pain;Decreased balance       PT Treatment Interventions Neuromuscular re-education;Wheelchair mobility training;Patient/family education;Therapeutic exercise;DME instruction;Therapeutic activities;Balance training;Functional mobility training    PT Goals (Current goals can be found in the Care Plan section)  Acute Rehab PT Goals Patient Stated Goal: unable to state PT Goal Formulation: Patient unable to participate in goal setting Time For Goal Achievement: 06/15/21 Potential to Achieve Goals: Fair    Frequency Min 2X/week   Barriers to discharge   unsure    Co-evaluation               AM-PAC PT "6 Clicks" Mobility  Outcome Measure Help needed turning from your back to your side while in a flat bed without using bedrails?: Total Help needed moving from lying on your back to sitting on the side of a flat bed without using bedrails?: Total Help needed moving to and from a bed to a chair (including a wheelchair)?: Total Help needed standing up from a chair using your arms (e.g., wheelchair or bedside chair)?: Total Help needed to walk in hospital room?: Total Help needed climbing 3-5 steps with a railing? : Total 6 Click Score: 6    End of Session   Activity Tolerance: Patient limited by fatigue;Patient  limited by pain Patient left: in bed;with call bell/phone within reach;with bed alarm set;with nursing/sitter in room Nurse Communication: Mobility status;Need for lift equipment PT Visit Diagnosis: Hemiplegia and hemiparesis;Muscle weakness (generalized) (M62.81) Hemiplegia - Right/Left: Right Hemiplegia - dominant/non-dominant: Dominant Hemiplegia - caused by: Other Nontraumatic intracranial hemorrhage    Time: 0830-0850 PT Time Calculation (min) (ACUTE ONLY): 20 min   Charges:   PT Evaluation $PT Eval Moderate Complexity: 1  Mod          Marisa Severin, PT, DPT Acute Rehabilitation Services Pager [phone] Office [phone]     Marguarite Arbour A Jamahl Lemmons 06/01/2021, 9:06 AM

## 2021-06-02 ENCOUNTER — Inpatient Hospital Stay (HOSPITAL_COMMUNITY): Payer: Medicare HMO

## 2021-06-02 DIAGNOSIS — Z7189 Other specified counseling: Secondary | ICD-10-CM

## 2021-06-02 DIAGNOSIS — Z515 Encounter for palliative care: Secondary | ICD-10-CM | POA: Diagnosis not present

## 2021-06-02 DIAGNOSIS — K5981 Ogilvie syndrome: Secondary | ICD-10-CM | POA: Diagnosis not present

## 2021-06-02 DIAGNOSIS — Z789 Other specified health status: Secondary | ICD-10-CM

## 2021-06-02 DIAGNOSIS — K5900 Constipation, unspecified: Secondary | ICD-10-CM | POA: Diagnosis not present

## 2021-06-02 DIAGNOSIS — G8191 Hemiplegia, unspecified affecting right dominant side: Secondary | ICD-10-CM

## 2021-06-02 LAB — CBC WITH DIFFERENTIAL/PLATELET
Abs Immature Granulocytes: 0.04 10*3/uL (ref 0.00–0.07)
Basophils Absolute: 0 10*3/uL (ref 0.0–0.1)
Basophils Relative: 0 %
Eosinophils Absolute: 0.1 10*3/uL (ref 0.0–0.5)
Eosinophils Relative: 1 %
HCT: 42 % (ref 36.0–46.0)
Hemoglobin: 14.1 g/dL (ref 12.0–15.0)
Immature Granulocytes: 0 %
Lymphocytes Relative: 53 %
Lymphs Abs: 5.1 10*3/uL — ABNORMAL HIGH (ref 0.7–4.0)
MCH: 30.4 pg (ref 26.0–34.0)
MCHC: 33.6 g/dL (ref 30.0–36.0)
MCV: 90.5 fL (ref 80.0–100.0)
Monocytes Absolute: 0.8 10*3/uL (ref 0.1–1.0)
Monocytes Relative: 8 %
Neutro Abs: 3.8 10*3/uL (ref 1.7–7.7)
Neutrophils Relative %: 38 %
Platelets: 207 10*3/uL (ref 150–400)
RBC: 4.64 MIL/uL (ref 3.87–5.11)
RDW: 13.2 % (ref 11.5–15.5)
WBC: 9.8 10*3/uL (ref 4.0–10.5)
nRBC: 0 % (ref 0.0–0.2)

## 2021-06-02 LAB — GLUCOSE, CAPILLARY
Glucose-Capillary: 85 mg/dL (ref 70–99)
Glucose-Capillary: 97 mg/dL (ref 70–99)
Glucose-Capillary: 104 mg/dL — ABNORMAL HIGH (ref 70–99)
Glucose-Capillary: 115 mg/dL — ABNORMAL HIGH (ref 70–99)

## 2021-06-02 LAB — BASIC METABOLIC PANEL
Anion gap: 9 (ref 5–15)
BUN: 12 mg/dL (ref 8–23)
CO2: 25 mmol/L (ref 22–32)
Calcium: 8.5 mg/dL — ABNORMAL LOW (ref 8.9–10.3)
Chloride: 100 mmol/L (ref 98–111)
Creatinine, Ser: 0.74 mg/dL (ref 0.44–1.00)
GFR, Estimated: >60 mL/min (ref >60)
Glucose, Bld: 94 mg/dL (ref 70–99)
Potassium: 2.9 mmol/L — ABNORMAL LOW (ref 3.5–5.1)
Sodium: 134 mmol/L — ABNORMAL LOW (ref 135–145)

## 2021-06-02 LAB — POTASSIUM: Potassium: 3.7 mmol/L (ref 3.5–5.1)

## 2021-06-02 MED ORDER — POTASSIUM CHLORIDE CRYS ER 20 MEQ PO TBCR: 40.0000 meq | EXTENDED_RELEASE_TABLET | ORAL | Status: DC

## 2021-06-02 MED ORDER — POTASSIUM CHLORIDE CRYS ER 20 MEQ PO TBCR
40.0000 meq | EXTENDED_RELEASE_TABLET | Freq: Four times a day (QID) | ORAL | Status: AC
  Administered 2021-06-02 (×2): 40 meq via ORAL
  Filled 2021-06-02 (×2): qty 2

## 2021-06-02 NOTE — Progress Notes (Signed)
PROGRESS NOTE  Kathleen Jensen  DOB: [birth year]  PCP: Donato Schultz, DO LFY:[id number]  DOA: 05/28/2021  LOS: 2 days  Hospital Day: 6  Chief Complaint  Patient presents with   Abdominal Pain   Brief narrative: Kathleen Jensen is a 70 y.o. 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 Patient was sent to the ED from nursing home on 10/31 with complaint of worsening abdominal pain, constipation and inability to tolerate oral intake for 1 week.  She was unsure when her last bowel movement was.  In the ED, patient was hemodynamically stable. Manual disimpaction was tried without success. Admitted to hospital service GI consultation was obtained. CT abdomen pelvis showed large burden burden of stool throughout the colon and rectum, stool balls in the rectum.  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  Small air-fluid level in the urinary bladder.  Correlate for recent catheterization.  Status post Roux type gastric bypass. In ED patient made n.p.o., given 1.5 normal saline bolus. Attempted manual disimpaction.  General surgery and GI were consulted and TR[facility] was asked to admit.  Subjective: Patient was seen and examined this morning.   Still remains constipated. KUB repeated this morning, pending report.  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  Currently on MiraLAX, Dulcolax, tapwater enema.  KUB done this morning.  Pending report.   -Received 1 dose of MiraLAX and Dulcolax this morning.  I will give  another dose of tapwater enema today.   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   -Patient continues to have poor oral intake.  Remains on clear liquid diet per GI recommendation.  Encourage oral intake. -Speech therapy evaluation obtained.  Type 2 diabetes mellitus -A1c 5.5 in 11/2.   -Currently on sliding scale insulin.  No report of hypoglycemia in last 24 hours.  Blood sugar level remains less than 100. Recent Labs  Lab 06/01/21 1148 06/01/21 1558 06/01/21 2055 06/02/21 0541 06/02/21 1104  GLUCAP 87 106* 99 85 97    History of CVA with right hemiplegia and aphasia  Hyperlipidemia -at baseline, patient has right hemiplegia, aphasia and mild cognitive disorder as well. -SLP swallow eval was obtained.  Diet advanced to soft today. -Unclear why patient is not on any antiplatelet or anticoagulant.  Need to be addressed as an outpatient.  Continue simvastatin  Essential hypertension -Continue losartan/HCTZ.  Seizures Anxiety/depression -seizure precautions -continue AED: kepra, dilantin, depakene, Lyrica, Cymbalta, baclofen    Hypothyroidism -continue synthroid    OSA -on no cpap or oxygen at night   Impaired mobility -Minimal mobility.  Requires a lift to mobilize.  PT eval obtained.   -Palliative care consulted for goals of care.     Mobility: Minimal mobility Living condition: Nursing home Goals of care:   Code Status: Full Code  Nutritional status: Body mass index is 32.62 kg/m.     Diet:  Diet Order             DIET DYS 3 Room service appropriate? Yes; Fluid consistency: Thin  Diet effective now  DVT prophylaxis: Lovenox subcu enoxaparin (LOVENOX) injection 40 mg Start: 05/29/21 1515   Antimicrobials: None Fluid: None Consultants: GI Family Communication: None at bedside  Status is: Inpatient  Remains inpatient appropriate because: Continues to have  severe constipation.  Bowel regimen to continue  Dispo: The patient is from: Nursing home              Anticipated d/c is to: Back to nursing home in 1 to 2 days              Patient currently is not medically stable to d/c.   Difficult to place patient No     Infusions:    Scheduled Meds:  baclofen  5 mg Oral BID   bisacodyl  10 mg Oral Daily   DULoxetine  30 mg Oral Daily   enoxaparin (LOVENOX) injection  40 mg Subcutaneous Q24H   hydrochlorothiazide  12.5 mg Oral Daily   insulin aspart  0-9 Units Subcutaneous TID WC   levETIRAcetam  2,000 mg Oral BID   levothyroxine  75 mcg Oral QAC breakfast   lidocaine  1 patch Transdermal Q24H   losartan  50 mg Oral Daily   multivitamin  1 tablet Oral Daily   nystatin  5 mL Oral QID   phenytoin  100 mg Oral Daily   phenytoin  150 mg Oral BID   polyethylene glycol  17 g Oral TID   potassium chloride  40 mEq Oral Q6H   pregabalin  50 mg Oral QHS   valproic acid  500 mg Oral QID    PRN meds: acetaminophen **OR** acetaminophen   Antimicrobials: Anti-infectives (From admission, onward)    None       Objective: Vitals:   06/01/21 2050 06/02/21 0808  BP: 108/87 119/68  Pulse: 60 60  Resp: 16 17  Temp: (!) 97.5 F (36.4 C) 98.4 F (36.9 C)  SpO2: 99% 96%    Intake/Output Summary (Last 24 hours) at 06/02/2021 1150 Last data filed at 06/02/2021 0513 Gross per 24 hour  Intake 60 ml  Output 900 ml  Net -840 ml    Filed Weights   05/29/21 2254  Weight: 78.3 kg   Weight change:  Body mass index is 32.62 kg/m.   Physical Exam: General exam: Pleasant, elderly Caucasian female.  Not in distress Skin: No rashes, lesions or ulcers. HEENT: Atraumatic, normocephalic, no obvious bleeding Lungs: Clear to auscultation bilaterally CVS: Regular rate and rhythm, no murmur GI/Abd soft, nontender, nondistended, bowel sound present CNS: Awake, alert, not restless or agitated.  Mumbling.  Baseline cognitive impairment   psychiatry: Sad affect Extremities: No pedal edema, no calf tenderness  Data Review: I have personally reviewed the laboratory data and studies available.  F/u labs ordered Unresulted Labs (From admission, onward)     Start     Ordered   06/02/21 1800  Potassium  Once-Timed,   TIMED       Question:  Specimen collection method  Answer:  Lab=Lab collect   06/02/21 0822   05/28/21 1449  Urinalysis, Routine w reflex microscopic Urine, Clean Catch  Once,   STAT        05/28/21 1449            Signed, Lorin Glass, MD [facility] 06/02/2021

## 2021-06-02 NOTE — Consult Note (Signed)
Consultation Note Date: 06/02/2021   Patient Name: Kathleen Jensen  DOB: 07/13/1951  MRN: [id number]  Age / Sex: 70 y.o., female  PCP: Zola Button, Grayling Congress, DO Referring Physician: Lorin Glass, MD  Reason for Consultation: Establishing goals of care  HPI/Patient Profile: 70 y.o. 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 Patient was admitted on 05/28/2021 with Ogilvie Syndrome/chronic constipation, decreased oral intake.   Clinical Assessment and Goals of Care: I have reviewed medical records including EPIC notes, labs, and imaging. Received report from primary RN - no acute concerns. Reports patient accepted medications today with apple juice, including miralax. Encouraged RN to continue to offer Miralax in apple juice if patient is accepting. Patient has not had a BM this shift and continues with poor PO intake.  Went to visit patient at bedside - sister/Diane present. Patient was lying in bed asleep - she wakes easily to voice/gentle touch. She is alert with expressive aphagia. She denies pain. No signs or non-verbal gestures of pain or discomfort noted. No respiratory distress, increased work of breathing, or secretions noted.   I introduced Palliative Medicine as specialized medical care for people living with serious illness. It focuses on providing relief from the symptoms and stress of a serious illness. The goal is to improve quality of life for both the patient and the family.  Sister explains she was able to get patient to eat several  bites off her lunch tray. Encouraged family to continue attempting to feed patient, as sometimes patient's are more accepting from family. Provided Diane with updates from nursing and updates on plan of care per notes per her request. Answered all questions.  2:33 PM Attempted to call daughter/[name] to discuss diagnosis, prognosis, GOC, EOL wishes, disposition, and options - no answer - confidential voicemail left and PMT phone number provided with request to return call.   Primary Decision Maker HCPOA/Legal Guardian per chart- Parke Poisson - do not have HCPOA or Legal Guardian documents    SUMMARY OF RECOMMENDATIONS   Continue current medical treatment Continue full code status as previously documented Unfortunately, was not able to speak with daughter today as she did not return calls. Will attempt to contact again tomorrow 11/6 Recommend offering Miralax in apple juice as patient was more receptive to drinking it that way per RN PMT will continue to follow and support holistically   Code Status/Advance Care Planning: Full code   Palliative Prophylaxis:  Aspiration, Bowel Regimen, Delirium Protocol, Frequent Pain Assessment, Oral Care, and Turn Reposition  Additional Recommendations (Limitations, Scope, Preferences): Full Scope Treatment  Psycho-social/Spiritual:  Created space and opportunity for patient and family to express thoughts and feelings regarding patient's current medical situation.  Emotional support and therapeutic  listening provided.  Prognosis:  Unable to determine  Discharge Planning: To Be Determined      Primary Diagnoses: Present on Admission:  Ogilvie syndrome  Essential hypertension  Hyperlipidemia  Hypothyroidism  OBSTRUCTIVE SLEEP APNEA  GERD  CVA (cerebral vascular accident) (HCC)   I have reviewed the medical record, interviewed the patient and family, and examined the patient. The following aspects are pertinent.  Past Medical  History:  Diagnosis Date   Aphasia    Arthritis    Asthma    Cardiac pacemaker    Cerebral amyloid angiopathy (CODE)    CKD (chronic kidney disease)    Cognitive communication deficit    Diabetes mellitus without complication (HCC)    resolved after gastric bypass   GERD (gastroesophageal reflux disease)    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 (HCC)    Hyperlipemia    Hypertension    Insomnia    Intracerebral hemorrhage, intraventricular (HCC)    Muscle weakness    Other abnormalities of gait and mobility    Seizures (HCC)    Stroke (HCC)    Unsteadiness on feet    Vitamin B deficiency    Social History   Socioeconomic History   Marital status: Divorced    Spouse name: Not on file   Number of children: 3   Years of education: 12yrs   Highest education level: Not on file  Occupational History   Occupation: Retired    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   Caffeine Use: 1 cup daily   1 dog   Social Determinants of Health   Financial Resource Strain: Not on file  Food Insecurity: Not on file  Transportation Needs: Not on file  Physical Activity: Not on file  Stress: Not on file  Social Connections: Not on file   Family History  Problem Relation Age of Onset   High blood pressure Mother    Diabetes Mother    Diabetes Father    Stroke Father    Cancer Sister        Colorectal cancer   Cancer Brother        colon   Scheduled Meds:  baclofen  5  mg Oral BID   bisacodyl  10 mg Oral Daily   DULoxetine  30 mg Oral Daily   enoxaparin (LOVENOX) injection  40 mg Subcutaneous Q24H   hydrochlorothiazide  12.5 mg Oral Daily   insulin aspart  0-9 Units Subcutaneous TID WC   levETIRAcetam  2,000 mg Oral BID   levothyroxine  75 mcg Oral QAC breakfast   lidocaine  1 patch Transdermal Q24H   losartan  50 mg Oral Daily   multivitamin  1 tablet Oral Daily   nystatin  5 mL Oral QID   phenytoin  100 mg Oral Daily   phenytoin  150 mg Oral BID   polyethylene glycol  17 g  Oral TID   potassium chloride  40 mEq Oral Q6H   pregabalin  50 mg Oral QHS   valproic acid  500 mg Oral QID   Continuous Infusions: PRN Meds:.acetaminophen **OR** acetaminophen Medications Prior to Admission:  Prior to Admission medications   Medication Sig Start Date End Date Taking? Authorizing Provider  acetaminophen (TYLENOL) 500 MG tablet Take 1,000 mg by mouth every 8 (eight) hours.   Yes [provider]  B Complex-C-Folic Acid TABS Take 1 tablet by mouth daily.   Yes [provider]  Baclofen 5 MG TABS Take 5 mg by mouth in the morning and at bedtime.   Yes [provider]  bisacodyl (DULCOLAX) 10 MG suppository Place 10 mg rectally 3 (three) times a week. Take on MWF   Yes [provider]  CALCIUM CITRATE PO Take 200 mg by mouth in the morning and at bedtime.   Yes [provider]  DULoxetine (CYMBALTA) 30 MG capsule Take 30 mg by mouth daily. 05/24/21  Yes [provider]  Lactobacillus (PROBIOTIC ACIDOPHILUS) CAPS Take 1 capsule by mouth daily.   Yes [provider]  levETIRAcetam (KEPPRA) 1000 MG tablet Take 2 tablets (2,000 mg total) by mouth 2 (two) times daily. 04/14/20  Yes Rai, Ripudeep K, MD  levothyroxine (SYNTHROID) 75 MCG tablet Take 75 mcg by mouth daily before breakfast.   Yes [provider]  Lidocaine 4 % PTCH Apply 1 patch topically daily.   Yes [provider]   losartan-hydrochlorothiazide (HYZAAR) 50-12.5 MG tablet Take 1 tablet by mouth daily.   Yes [provider]  ondansetron (ZOFRAN-ODT) 4 MG disintegrating tablet Take 4 mg by mouth in the morning, at noon, and at bedtime. 05/24/21  Yes [provider]  phenytoin (DILANTIN) 125 MG/5ML suspension Take 100-150 mg by mouth as directed. Take 6 mls (150 mg) BID & Take 4 mls (100 mg) at noon 05/22/21  Yes [provider]  polyethylene glycol (MIRALAX / GLYCOLAX) packet Take 17 g by mouth at bedtime. Mix with 6 - 8 oz liquid.   Yes [provider]  pregabalin (LYRICA) 50 MG capsule Take 50 mg by mouth at bedtime. 04/03/21  Yes [provider]  senna-docusate (SENOKOT-S) 8.6-50 MG tablet Take 2 tablets by mouth 2 (two) times daily.   Yes [provider]  simvastatin (ZOCOR) 10 MG tablet Take 10 mg by mouth daily.   Yes [provider]  valproic acid (DEPAKENE) 250 MG/5ML solution Take 500 mg by mouth 4 (four) times daily.   Yes [provider]  phenytoin (DILANTIN) 100 MG ER capsule Take 2 capsules (200 mg total) by mouth 2 (two) times daily. Patient not taking: No sig reported 04/14/20   Rai, Delene Ruffini, MD   Allergies  Allergen Reactions   Tape Rash   Meperidine Hcl Nausea And Vomiting   Sulfonamide Derivatives Itching   Bacitracin-Polymyxin B Dermatitis    "Cloth Band-Aid only"   Oxycodone Itching   Review of Systems  Unable to perform ROS: Other   Physical Exam Vitals and nursing note reviewed.  Constitutional:      General: She is not in acute distress. Pulmonary:     Effort: No respiratory distress.  Skin:    General: Skin is warm and dry.  Neurological:     Mental Status: She is alert. Mental status is at baseline.     Motor: Weakness present.  Psychiatric:        Attention and Perception:  Attention normal.        Behavior: Behavior is cooperative.     Comments: Speech: expressive aphagia     Vital Signs: BP  119/68 (BP Location: Left Arm)   Pulse 60   Temp 98.4 F (36.9 C) (Oral)   Resp 17   Ht 5\' 1"  (1.549 m)   Wt 78.3 kg   SpO2 96%   BMI 32.62 kg/m  Pain Scale: Faces   Pain Score: Asleep   SpO2: SpO2: 96 % O2 Device:SpO2: 96 % O2 Flow Rate: .O2 Flow Rate (L/min): 2 L/min  IO: Intake/output summary:  Intake/Output Summary (Last 24 hours) at 06/02/2021 1351 Last data filed at 06/02/2021 0513 Gross per 24 hour  Intake 60 ml  Output 900 ml  Net -840 ml    LBM: Last BM Date: 05/31/21 Baseline Weight: Weight: 78.3 kg Most recent weight: Weight: 78.3 kg     Palliative Assessment/Data: PPS 20-30%     Time In: 1400 Time Out: 1455 Time Total: 55 minutes  Greater than 50%  of this time was spent counseling and coordinating care related to the above assessment and plan.  Signed by: 13/03/22, NP   Please contact Palliative Medicine Team phone at [phone] for questions and concerns.  For individual provider: See [phone]

## 2021-06-02 NOTE — Progress Notes (Signed)
Patient unable to follow commands long enough to have a successful enema. Will have night shift attempt again tonight.

## 2021-06-02 NOTE — Plan of Care (Signed)
  Problem: Education: Goal: Knowledge of General Education information will improve Description: Including pain rating scale, medication(s)/side effects and non-pharmacologic comfort measures Outcome: Not Progressing   Problem: Pain Managment: Goal: General experience of comfort will improve Outcome: Progressing

## 2021-06-02 NOTE — Progress Notes (Signed)
[facility] Progress Note  CC:  Ogilvies, constipation  Subjective:  Apparently is refusing to drink Miralax, has only been getting one dose daily--did get two yesterday.  Reports some abdominal pain.      Objective:  Vital signs in last 24 hours: Temp:  [97.5 F (36.4 C)-98.4 F (36.9 C)] 98.4 F (36.9 C) (11/05 0808) Pulse Rate:  [60] 60 (11/05 0808) Resp:  [16-18] 17 (11/05 0808) BP: (108-143)/(68-87) 119/68 (11/05 0808) SpO2:  [96 %-99 %] 96 % (11/05 0808) Last BM Date: 05/31/21 General:  Alert, chronically ill-appearing in NAD Heart:  Regular rate and rhythm; no murmurs Pulm:  CTAB.  No W/R/R. Abdomen:  Soft, somewhat distended.  BS present but very quiet and sparse.  Mild diffuse TTP. Extremities:  Without edema.  Intake/Output from previous day: 11/04 0701 - 11/05 0700 In: 60 [P.O.:60] Out: 900 [Urine:900]  Lab Results: Recent Labs    05/31/21 0238 06/01/21 0243 06/02/21 0541  WBC 7.5 11.6* 9.8  HGB 14.1 15.1* 14.1  HCT 41.4 45.1 42.0  PLT 171 168 207   BMET Recent Labs    05/31/21 0238 06/01/21 0243 06/02/21 0541  NA 135 135 134*  K 3.6 4.4 2.9*  CL 102 102 100  CO2 26 23 25   GLUCOSE 90 85 94  BUN 11 11 12   CREATININE 0.70 0.66 0.74  CALCIUM 8.4* 8.7* 8.5*    DG Abd 1 View  Result Date: 06/01/2021 CLINICAL DATA:  70 year old female with history of abdominal pain. EXAM: ABDOMEN - 1 VIEW COMPARISON:  Abdominal radiograph 05/31/2021. FINDINGS: In the left mid abdomen there is an isolated loop of small bowel which is slightly prominent measuring 3.3 cm in diameter. No other pathologic dilatation of small bowel. Large amount of stool again noted throughout the entirety of the colon, suggesting constipation. No gross pneumoperitoneum on the supine images. Orthopedic fixation hardware in the lower lumbar spine. Pacemaker leads. IMPRESSION: 1. Nonspecific, nonobstructive bowel gas pattern. Overall, findings are suggestive of severe  constipation. Electronically Signed   By: 66 M.D.   On: 06/01/2021 07:42   DG Abd Portable 1V  Result Date: 06/02/2021 CLINICAL DATA:  Constipation EXAM: PORTABLE ABDOMEN - 1 VIEW COMPARISON:  06/01/2021 abdominal radiographs FINDINGS: Prominent diffuse colonic distention by stool and gas, slightly improved. No disproportionately dilated small bowel loops. No evidence of pneumatosis or pneumoperitoneum. Pacemaker leads overlie the heart. Clear lung bases. No radiopaque nephrolithiasis. Surgical clips are seen in the right upper quadrant of the abdomen. Bilateral posterior spinal fusion hardware in the lower lumbar spine. IMPRESSION: Prominent diffuse colonic distention by stool and gas, compatible with reported constipation, slightly improved. No evidence of small-bowel obstruction. Electronically Signed   By: 13/11/2020 M.D.   On: 06/02/2021 12:01    Assessment / Plan: Abdominal pain, nausea, vomiting in setting of constipation, CT suggesting pseudoobstruction.  Current constipation related meds include twice daily MiraLAX.  Today's x-ray shows nonobstructive, nonspecific bowel gas pattern but still has a large amount of stool throughout the colon.  Multiple meds she takes are constipating. Previous colonscopy several yr ago w [facility].  X-ray this AM shows slight improvement in diffuse colonic distention.   Hypokalemia:  K+ 2.9 today.  Need to aim to keep it above 4.   Hypothyroidism.  TSH normal.    S/p past Roux en Y bypass.      R hemiplegia, aphasia post CVA in past.     -Aim to  keep K+ above 4. -Another tap water enema has been ordered. -Plan was to increase Miralax to 34 grams TID but she is not even drinking the 17 gram doses that are ordered. -OOB to the chair daily, turn her side to side.   LOS: 2 days   Princella Pellegrini. Corda Shutt  06/02/2021, 12:27 PM

## 2021-06-03 ENCOUNTER — Inpatient Hospital Stay (HOSPITAL_COMMUNITY): Payer: Medicare HMO

## 2021-06-03 DIAGNOSIS — G8191 Hemiplegia, unspecified affecting right dominant side: Secondary | ICD-10-CM | POA: Diagnosis not present

## 2021-06-03 DIAGNOSIS — K5981 Ogilvie syndrome: Secondary | ICD-10-CM | POA: Diagnosis not present

## 2021-06-03 DIAGNOSIS — R109 Unspecified abdominal pain: Secondary | ICD-10-CM | POA: Diagnosis not present

## 2021-06-03 DIAGNOSIS — K5900 Constipation, unspecified: Secondary | ICD-10-CM | POA: Diagnosis not present

## 2021-06-03 LAB — BASIC METABOLIC PANEL
Anion gap: 9 (ref 5–15)
BUN: 19 mg/dL (ref 8–23)
CO2: 22 mmol/L (ref 22–32)
Calcium: 8.4 mg/dL — ABNORMAL LOW (ref 8.9–10.3)
Chloride: 102 mmol/L (ref 98–111)
Creatinine, Ser: 0.86 mg/dL (ref 0.44–1.00)
GFR, Estimated: >60 mL/min (ref >60)
Glucose, Bld: 91 mg/dL (ref 70–99)
Potassium: 4.1 mmol/L (ref 3.5–5.1)
Sodium: 133 mmol/L — ABNORMAL LOW (ref 135–145)

## 2021-06-03 LAB — GLUCOSE, CAPILLARY
Glucose-Capillary: 68 mg/dL — ABNORMAL LOW (ref 70–99)
Glucose-Capillary: 95 mg/dL (ref 70–99)
Glucose-Capillary: 75 mg/dL (ref 70–99)
Glucose-Capillary: 104 mg/dL — ABNORMAL HIGH (ref 70–99)
Glucose-Capillary: 94 mg/dL (ref 70–99)

## 2021-06-03 NOTE — Progress Notes (Signed)
PROGRESS NOTE  Kathleen Jensen  DOB: 1950/10/29  PCP: Donato Schultz, DO BZJ:[id number]  DOA: 05/28/2021  LOS: 3 days  Hospital Day: 7  Chief Complaint  Patient presents with   Abdominal Pain   Brief narrative: Kathleen Jensen is a 70 y.o. 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 Patient was sent to the ED from nursing home on 10/31 with complaint of worsening abdominal pain, constipation and inability to tolerate oral intake for 1 week.  She was unsure when her last bowel movement was.  In the ED, patient was hemodynamically stable. Manual disimpaction was tried without success. Admitted to hospital service GI consultation was obtained. CT abdomen pelvis showed large burden burden of stool throughout the colon and rectum, stool balls in the rectum.  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  Small air-fluid level in the urinary bladder.  Correlate for recent catheterization.  Status post Roux type gastric bypass. In ED patient made n.p.o., given 1.5 normal saline bolus. Attempted manual disimpaction.   General surgery and GI were consulted. Admitted to [facility].  Subjective: Patient was seen and examined this morning.  Alert, awake, mumbling.   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  Currently on MiraLAX, Dulcolax, tapwater enema.   -Repeat KUB this morning.   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   -Patient  continues to have poor oral intake.  Remains on clear liquid diet per GI recommendation.  Encourage oral intake. -Speech therapy evaluation obtained.  Type 2 diabetes mellitus -A1c 5.5 in 11/2.   -Currently on sliding scale insulin.  Blood sugar level low at 68 this morning.  Currently on soft diet with no calorie restriction.  Continue to monitor. Recent Labs  Lab 06/02/21 1104 06/02/21 1607 06/02/21 2145 06/03/21 0703 06/03/21 0827  GLUCAP 97 104* 115* 68* 95   History of CVA with right hemiplegia and aphasia  Hyperlipidemia -at baseline, patient has right hemiplegia, aphasia and mild cognitive disorder as well. -SLP swallow eval was obtained.  Soft diet -Unclear why patient is not on any antiplatelet or anticoagulant.  Need to be addressed as an outpatient.  Continue simvastatin  Essential hypertension -Continue losartan/HCTZ.  Seizures Anxiety/depression -seizure precautions -continue AED: kepra, dilantin, depakene, Lyrica, Cymbalta, baclofen    Hypothyroidism -continue synthroid    OSA -on no cpap or oxygen at night   Impaired mobility -Minimal mobility.  Requires a lift to mobilize.  PT eval obtained.   -Palliative care consulted for goals of care.     Mobility: Minimal mobility Living condition: Nursing home Goals of care:   Code Status: Full Code  Nutritional status: Body mass index is 32.62 kg/m.     Diet:  Diet Order             DIET DYS 3 Room service appropriate? Yes; Fluid consistency: Thin  Diet effective now                  DVT prophylaxis: Lovenox subcu enoxaparin (LOVENOX) injection 40 mg Start: 05/29/21 1515   Antimicrobials: None Fluid: None Consultants: GI Family Communication: None  at bedside  Status is: Inpatient  Remains inpatient appropriate because: Continues to have severe constipation.  Bowel regimen to continue  Dispo: The patient is from: Nursing home              Anticipated d/c is to: Back to nursing home in 1 to  2 days after GI clearance              Patient currently is not medically stable to d/c.   Difficult to place patient No     Infusions:    Scheduled Meds:  baclofen  5 mg Oral BID   bisacodyl  10 mg Oral Daily   DULoxetine  30 mg Oral Daily   enoxaparin (LOVENOX) injection  40 mg Subcutaneous Q24H   hydrochlorothiazide  12.5 mg Oral Daily   insulin aspart  0-9 Units Subcutaneous TID WC   levETIRAcetam  2,000 mg Oral BID   levothyroxine  75 mcg Oral QAC breakfast   lidocaine  1 patch Transdermal Q24H   losartan  50 mg Oral Daily   multivitamin  1 tablet Oral Daily   nystatin  5 mL Oral QID   phenytoin  100 mg Oral Daily   phenytoin  150 mg Oral BID   polyethylene glycol  17 g Oral TID   pregabalin  50 mg Oral QHS   valproic acid  500 mg Oral QID    PRN meds: acetaminophen **OR** acetaminophen   Antimicrobials: Anti-infectives (From admission, onward)    None       Objective: Vitals:   06/02/21 1943 06/03/21 0824  BP: 108/78 118/87  Pulse: 62 60  Resp: 17 16  Temp: 98.6 F (37 C) 98 F (36.7 C)  SpO2: 96% 95%    Intake/Output Summary (Last 24 hours) at 06/03/2021 1125 Last data filed at 06/02/2021 1831 Gross per 24 hour  Intake 200 ml  Output 400 ml  Net -200 ml   Filed Weights   05/29/21 2254  Weight: 78.3 kg   Weight change:  Body mass index is 32.62 kg/m.   Physical Exam: General exam: Pleasant, elderly Caucasian female.  Mumbling.  Nonspecific complaints.  Not in physical distress Skin: No rashes, lesions or ulcers. HEENT: Atraumatic, normocephalic, no obvious bleeding Lungs: Clear to auscultation bilaterally CVS: Regular rate and rhythm, no murmur GI/Abd soft, continues to have mild tenderness, nondistended, bowel sound present CNS: Awake, alert, not restless or agitated.  Mumbling.  Baseline cognitive impairment  psychiatry: Sad affect Extremities: No pedal edema, no calf tenderness  Data Review: I have personally reviewed the  laboratory data and studies available.  F/u labs ordered Unresulted Labs (From admission, onward)     Start     Ordered   05/28/21 1449  Urinalysis, Routine w reflex microscopic Urine, Clean Catch  Once,   STAT        05/28/21 1449            Signed, Lorin Glass, MD [facility] 06/03/2021

## 2021-06-03 NOTE — Progress Notes (Signed)
     [facility] Progress Note  CC:  Ogilvies, constipation  Subjective: Patient expressing some discontent with current bowel regimen.  She states that she is fine.  Objective:  Vital signs in last 24 hours: Temp:  [98 F (36.7 C)-98.6 F (37 C)] 98.2 F (36.8 C) (11/06 1312) Pulse Rate:  [60-62] 60 (11/06 1312) Resp:  [16-17] 16 (11/06 1312) BP: (108-118)/(76-87) 118/76 (11/06 1312) SpO2:  [95 %-100 %] 100 % (11/06 1312) Last BM Date: 05/31/21 General:  Alert, chronically ill-appearing in NAD Heart:  Regular rate and rhythm; no murmurs Pulm:  CTAB.  No W/R/R. Abdomen:  Soft, somewhat distended.  BS present but quiet.  Mild diffuse TTP. Extremities:  Without edema.  Intake/Output from previous day: 11/05 0701 - 11/06 0700 In: 200 [P.O.:200] Out: 400 [Urine:400]  Lab Results: Recent Labs    06/01/21 0243 06/02/21 0541  WBC 11.6* 9.8  HGB 15.1* 14.1  HCT 45.1 42.0  PLT 168 207   BMET Recent Labs    06/01/21 0243 06/02/21 0541 06/02/21 1843 06/03/21 0106  NA 135 134*  --  133*  K 4.4 2.9* 3.7 4.1  CL 102 100  --  102  CO2 23 25  --  22  GLUCOSE 85 94  --  91  BUN 11 12  --  19  CREATININE 0.66 0.74  --  0.86  CALCIUM 8.7* 8.5*  --  8.4*    DG Abd Portable 1V  Result Date: 06/03/2021 CLINICAL DATA:  Constipation.  Abdominal pain. EXAM: PORTABLE ABDOMEN - 1 VIEW COMPARISON:  Multiple prior radiographs most recently 06/02/2021, yesterday. CT 05/29/2021 FINDINGS: No significant change in the large colonic stool burden. There is also mild gaseous colonic distension. No evidence of small bowel dilatation or small bowel obstruction. Stable pelvic calcifications corresponding to fibroids. Chain sutures noted in the left abdomen. IMPRESSION: No significant change in colonic distension with large volume of stool and air. No small bowel dilatation or obstruction Electronically Signed   By: Narda Rutherford M.D.   On: 06/03/2021 15:39   DG Abd Portable  1V  Result Date: 06/02/2021 CLINICAL DATA:  Constipation EXAM: PORTABLE ABDOMEN - 1 VIEW COMPARISON:  06/01/2021 abdominal radiographs FINDINGS: Prominent diffuse colonic distention by stool and gas, slightly improved. No disproportionately dilated small bowel loops. No evidence of pneumatosis or pneumoperitoneum. Pacemaker leads overlie the heart. Clear lung bases. No radiopaque nephrolithiasis. Surgical clips are seen in the right upper quadrant of the abdomen. Bilateral posterior spinal fusion hardware in the lower lumbar spine. IMPRESSION: Prominent diffuse colonic distention by stool and gas, compatible with reported constipation, slightly improved. No evidence of small-bowel obstruction. Electronically Signed   By: Delbert Phenix M.D.   On: 06/02/2021 12:01    Assessment / Plan: Abdominal pain, nausea, vomiting in setting of constipation, CT suggesting pseudoobstruction.  Current constipation related meds include twice daily MiraLAX.  KUB shows stable stool and distension   Hypothyroidism.  TSH normal.    S/p past Roux en Y bypass.      R hemiplegia, aphasia post CVA in past.     -Aim to keep K+ above 4. -Continue Miralax and tap water enemas -OOB to the chair daily, turn her side to side. - Palliative care following for GOC discussions - Repeat KUB tomorrow   LOS: 3 days   Imogene Burn  06/03/2021, 4:08 PM

## 2021-06-03 NOTE — Progress Notes (Signed)
Daily Progress Note   Patient Name: Kathleen Jensen       Date: 06/03/2021 DOB: 02/28/1951  Age: 70 y.o. MRN#: [id number] Attending Physician: Terrilee Croak, MD Primary Care Physician: Carollee Herter, Alferd Apa, DO Admit Date: 05/28/2021  Reason for Consultation/Follow-up: Establishing goals of care  Subjective: Chart review performed. Received report from primary RN - no acute concerns. Patient has been accepting miralax in apple juice and had BM last night. RN reports patient's appetite has improved today.  Went to visit patient at bedside - no family/visitors present. Patient was lying in bed alert with expressive aphagia. She moans and indicates she is having abdominal pain - RN notified. No respiratory distress, increased work of breathing, or secretions noted.   Attempted to call daughter/Kathleen Jensen with success today.  Met with Kathleen Jensen via phone  to discuss diagnosis, prognosis, GOC, EOL wishes, disposition, and options.  I introduced Palliative Medicine as specialized medical care for people living with serious illness. It focuses on providing relief from the symptoms and stress of a serious illness. The goal is to improve quality of life for both the patient and the family.  We discussed a brief life review of the patient as well as functional and nutritional status. Patient's husband is deceased - they had three children together, all girls. Prior to hospitalization, patient was living at [facility] Kathleen Jensen tells me their goal is to find resources to get the patient home to live with family - offered TOC support in getting private duty caregiver information - she was appreciative and agreeable. Patient has lived at [facility] for almost 3 years. Kathleen Jensen states patient has  had a gradual physical decline while at [facility]. Prior to patient's impaction, Kathleen Jensen reports patient's appetite was well; however, recently it has been poor.   We discussed patient's current illness and what it means in the larger context of patient's on-going co-morbidities. Kathleen Jensen understands that CKD and cerebral amyloid angiopathy are a progressive, non-curable disease underlying the patient's current acute medical conditions. Provided medical updates per notes, updates per nursing, and updates from my assessments - answered all questions to the best of my ability. Natural disease trajectory and expectations at EOL were discussed. I attempted to elicit values and goals of care important to the patient. The difference between aggressive medical intervention  and comfort care was considered in light of the patient's goals of care.   Advance directives, concepts specific to code status, artificial feeding and hydration, and rehospitalization were considered and discussed. Reviewed MOST form in ACP tab - Kathleen Jensen confirms full code/full scope care. Kathleen Jensen states after her father passed, the patient and herself had a conversation on what patient's goals and wishes would be - patient indicated she would want full attempts at resuscitation with time limited trials but would not want indefinite life support. Kathleen Jensen confirms she is patient's HCPOA, not legal guardian - requested copies of HCPOA. She does not think patient has a Living Will.   Palliative Care services outpatient were explained and offered - Kathleen Jensen was appreciative and agreeable.   Discussed with family the importance of continued conversation with each other and the medical providers regarding overall plan of care and treatment options, ensuring decisions are within the context of the patient's values and GOCs.    Questions and concerns were addressed. The patient/family was encouraged to call with questions and/or concerns. PMT  number was provided.   Length of Stay: 3  Current Medications: Scheduled Meds:  . baclofen  5 mg Oral BID  . bisacodyl  10 mg Oral Daily  . DULoxetine  30 mg Oral Daily  . enoxaparin (LOVENOX) injection  40 mg Subcutaneous Q24H  . hydrochlorothiazide  12.5 mg Oral Daily  . insulin aspart  0-9 Units Subcutaneous TID WC  . levETIRAcetam  2,000 mg Oral BID  . levothyroxine  75 mcg Oral QAC breakfast  . lidocaine  1 patch Transdermal Q24H  . losartan  50 mg Oral Daily  . multivitamin  1 tablet Oral Daily  . nystatin  5 mL Oral QID  . phenytoin  100 mg Oral Daily  . phenytoin  150 mg Oral BID  . polyethylene glycol  17 g Oral TID  . pregabalin  50 mg Oral QHS  . valproic acid  500 mg Oral QID    Continuous Infusions:   PRN Meds: acetaminophen **OR** acetaminophen  Physical Exam Vitals and nursing note reviewed.  Constitutional:      General: She is not in acute distress. Pulmonary:     Effort: No respiratory distress.  Skin:    General: Skin is warm and dry.  Neurological:     Mental Status: She is alert.     Motor: Weakness present.  Psychiatric:        Attention and Perception: Attention normal.        Behavior: Behavior is cooperative.     Comments: expressive aphagia            Vital Signs: BP 118/76 (BP Location: Right Arm)   Pulse 60   Temp 98.2 F (36.8 C) (Oral)   Resp 16   Ht 5' 1"  (1.549 m)   Wt 78.3 kg   SpO2 100%   BMI 32.62 kg/m  SpO2: SpO2: 100 % O2 Device: O2 Device: Room Air O2 Flow Rate: O2 Flow Rate (L/min): 2 L/min  Intake/output summary:  Intake/Output Summary (Last 24 hours) at 06/03/2021 1338 Last data filed at 06/02/2021 1831 Gross per 24 hour  Intake 100 ml  Output 400 ml  Net -300 ml   LBM: Last BM Date: 05/31/21 Baseline Weight: Weight: 78.3 kg Most recent weight: Weight: 78.3 kg       Palliative Assessment/Data: PPS 30%      Patient Active Problem List   Diagnosis Date Noted  . Constipation   .  Abdominal pain    . 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  . Right hemiplegia ([place]) 04/12/2020  . 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  . Hyperlipidemia associated with type 2 diabetes mellitus ([place]) 07/30/2018  . History of cerebral infarction 07/30/2018  . Vitamin B 12 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/30/2018  . Asthma 03/13/2018  . Seizures ([place]) 01/12/2018  . CVA (cerebral vascular accident) ([place]) 12/08/2017  . Abnormal thyroid blood test 12/08/2017  . Pulmonary embolism and infarction ([place]) 12/08/2017  . Anemia 12/08/2017  . Expressive aphasia 10/15/2017  . Seizure disorder ([place]) 10/15/2017  . Dysphagia 10/15/2017  . Diabetes mellitus without complication ([place])   . Hyperlipidemia 09/22/2017  . Urinary frequency 09/22/2017  . Airway hyperreactivity 01/26/2016  . Disease of thyroid gland 01/26/2016  . 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 Other specified postprocedural states 01/26/2016  . 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 Chronic LBP 04/10/2015  . Degeneration of intervertebral disc of lumbar region 04/10/2015  . Spondylolisthesis of lumbar region 04/10/2015  . 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  . History of cardiac pacemaker in situ 01/09/2015  . B12 DEFICIENCY 08/15/2008  . UNSPECIFIED VITAMIN D DEFICIENCY 08/15/2008  . SINUS BRADYCARDIA 08/15/2008  . OBSTRUCTIVE SLEEP APNEA 08/11/2008  . INSOMNIA 04/28/2008  . Hyperlipidemia LDL goal <70 12/12/2006  . RHINITIS, ALLERGIC NEC 12/12/2006  . Hypothyroidism 12/11/2006  . Essential hypertension 12/11/2006  . GERD 12/11/2006  . STRESS INCONTINENCE 12/11/2006    Palliative Care Assessment & Plan   Patient Profile: 70 y.o. 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 Patient was admitted on 05/28/2021 with Ogilvie Syndrome/chronic constipation, decreased oral intake.   Assessment: Ogilvie syndrome Chronic constipation Decreased oral intake Dysphasia History of CVA with right hemiplegia and aphasia   Recommendations/Plan: Continue current full scope medical treatment Continue full code status - patient previously indicated to daughter she would want full attempts at resuscitation with time limited trials but would not want indefinite life support  Plan is for discharge back to [facility] with outpatient Palliative Care. Daughter's overall goal is for patient to move back in with family once they can find appropriate resources to help care for patient at their home [facility] consulted for: private duty caregiver information and outpatient Palliative Care referral Reviewed MOST form in ACP tab - no changes PMT will continue to follow peripherally. If there are any imminent needs please call the service directly  Goals of Care and Additional Recommendations: Limitations on Scope of Treatment: Full Scope Treatment  Code Status:    Code Status Orders  (From admission, onward)           Start     Ordered   05/29/21 1500  Full code  Continuous        05/29/21 1500           Code Status History     Date Active Date Inactive Code Status Order ID Comments User Context   04/12/2020 0114 04/14/2020 1847 Full Code [id number]  Etta Quill, DO ED   10/05/2017 1544 10/15/2017 1619 Full Code [id number]  Radene Gunning, NP ED       Prognosis:  Unable to determine  Discharge Planning:  [place] for rehab with Palliative care service follow-up  Care plan was discussed with primary RN, patient's daughter  Thank you for allowing the Palliative Medicine Team to assist in the care of this patient.   Total Time 70 minutes  Prolonged Time Billed  yes       Greater than 50%  of this time was spent counseling and coordinating care related to the above assessment and plan.  Lin Landsman, NP  Please contact Palliative Medicine Team phone at [phone] for questions and concerns.

## 2021-06-04 DIAGNOSIS — K5900 Constipation, unspecified: Secondary | ICD-10-CM | POA: Diagnosis not present

## 2021-06-04 DIAGNOSIS — K5981 Ogilvie syndrome: Secondary | ICD-10-CM | POA: Diagnosis not present

## 2021-06-04 LAB — GLUCOSE, CAPILLARY
Glucose-Capillary: 82 mg/dL (ref 70–99)
Glucose-Capillary: 136 mg/dL — ABNORMAL HIGH (ref 70–99)
Glucose-Capillary: 87 mg/dL (ref 70–99)
Glucose-Capillary: 99 mg/dL (ref 70–99)

## 2021-06-04 MED ORDER — PEG 3350-KCL-NA BICARB-NACL 420 G PO SOLR
4000.0000 mL | Freq: Once | ORAL | Status: AC
  Administered 2021-06-04: 4000 mL via ORAL
  Filled 2021-06-04: qty 4000

## 2021-06-04 MED ORDER — METOCLOPRAMIDE HCL 5 MG/ML IJ SOLN
10.0000 mg | Freq: Four times a day (QID) | INTRAMUSCULAR | Status: AC
  Administered 2021-06-04 (×2): 10 mg via INTRAVENOUS
  Filled 2021-06-04 (×2): qty 2

## 2021-06-04 NOTE — Plan of Care (Signed)
  Problem: Education: Goal: Knowledge of General Education information will improve Description: Including pain rating scale, medication(s)/side effects and non-pharmacologic comfort measures Outcome: Progressing   Problem: Clinical Measurements: Goal: Ability to maintain clinical measurements within normal limits will improve Outcome: Progressing Goal: Will remain free from infection Outcome: Progressing Goal: Diagnostic test results will improve Outcome: Progressing Goal: Respiratory complications will improve Outcome: Progressing   Problem: Activity: Goal: Risk for activity intolerance will decrease Outcome: Progressing   Problem: Nutrition: Goal: Adequate nutrition will be maintained Outcome: Progressing   

## 2021-06-04 NOTE — Progress Notes (Signed)
Attempted to place NG tube, met resistance in both nares, patient very upset and refusing more attempts, MD notified, canceled NG ordered to have her drink her bowel prep then.

## 2021-06-04 NOTE — Care Management Important Message (Signed)
Important Message  Patient Details  Name: Kathleen Jensen MRN: [id number] Date of Birth: 05/03/1951   Medicare Important Message Given:  Yes     Johntavius Shepard Stefan Church 06/04/2021, 1:41 PM

## 2021-06-04 NOTE — Progress Notes (Addendum)
Attending physician's note   I have taken an interval history, reviewed the chart and examined the patient. I agree with the Advanced Practitioner's note, impression, and recommendations as outlined.  I discussed this plan with her family members at bedside today, who are in agreement.  GI service will continue to follow intermittently.  Noriko Macari, DO, FACG [phone] office         RN says no bowel movements on her shift and nothing reported to her from the overnight crew.  Last recorded stool output was on 11/2. Current constipation/obstipation related meds include Dulcolax 10 mg daily.  MiraLAX 17 g tid.  Yesterday's abdominal x-ray showed no change of colonic distention with large degree of stool and air but no obstruction.  Small bowel not dilated.  Case reviewed with Dr. Barron Alvine and with her attending, Dr. Sula Rumple  Plan now is to place NG tube and administer full GoLytely bowel prep over several hours.  Accompanying this will be 2 doses of IV Reglan every 6 hours. Hopefully this will get this recalcitrant obstipation resolved.  Jennye Moccasin, PA-C

## 2021-06-04 NOTE — Progress Notes (Signed)
PROGRESS NOTE  Kathleen Jensen  DOB: 10-19-1950  PCP: Donato Schultz, DO OXB:[id number]  DOA: 05/28/2021  LOS: 4 days  Hospital Day: 8  Chief Complaint  Patient presents with   Abdominal Pain   Brief narrative: Kathleen Jensen is a 70 y.o. 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 Patient was sent to the ED from nursing home on 10/31 with complaint of worsening abdominal pain, constipation and inability to tolerate oral intake for 1 week.  She was unsure when her last bowel movement was.  In the ED, patient was hemodynamically stable. Manual disimpaction was tried without success. Admitted to hospital service GI consultation was obtained. CT abdomen pelvis showed large burden burden of stool throughout the colon and rectum, stool balls in the rectum.  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  Small air-fluid level in the urinary bladder.  Correlate for recent catheterization.  Status post Roux type gastric bypass. In ED patient made n.p.o., given 1.5 normal saline bolus. Attempted manual disimpaction.   General surgery and GI were consulted. Admitted to [facility].  Subjective: Patient was seen and examined this morning.   Alert, awake, still has distended abdomen.  KUB continues to show severe constipation.  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  Currently on MiraLAX, Dulcolax, tapwater enema.   -Discussed with GI this morning.  Still no bowel movement in several days.  We will plan for NG tube  insertion and GoLytely.   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   -Patient continues to have poor oral intake.  Remains on clear liquid diet per GI recommendation.  Encourage oral intake. -Speech therapy evaluation obtained.  Type 2 diabetes mellitus -A1c 5.5 in 11/2.   -Currently on sliding scale insulin.  Blood sugar level low at 68 this morning.  Currently on soft diet with no calorie restriction.  Continue to monitor. Recent Labs  Lab 06/03/21 1127 06/03/21 1302 06/03/21 1609 06/04/21 0831 06/04/21 1133  GLUCAP 75 104* 94 82 136*   History of CVA with right hemiplegia and aphasia  Hyperlipidemia -at baseline, patient has right hemiplegia, aphasia and mild cognitive disorder as well. -SLP swallow eval was obtained.  Soft diet -Unclear why patient is not on any antiplatelet or anticoagulant.  Need to be addressed as an outpatient.  Continue simvastatin  Essential hypertension -Continue losartan/HCTZ.  Seizures Anxiety/depression -seizure precautions -continue AED: kepra, dilantin, depakene, Lyrica, Cymbalta, baclofen    Hypothyroidism -continue synthroid    OSA -on no cpap or oxygen at night   Impaired mobility -Minimal mobility.  Requires a lift to mobilize.  PT eval obtained.   -Palliative care consulted for goals of care.     Mobility: Minimal mobility Living condition: Nursing home Goals of care:   Code Status: Full Code  Nutritional status: Body mass index is 32.62 kg/m.     Diet:  Diet Order             DIET DYS 3 Room service appropriate? Yes; Fluid consistency: Thin  Diet effective now  DVT prophylaxis: Lovenox subcu enoxaparin (LOVENOX) injection 40 mg Start: 05/29/21 1515   Antimicrobials: None Fluid: None Consultants: GI Family Communication: None at bedside  Status is: Inpatient  Remains inpatient appropriate because: Continues to have  severe constipation.  Bowel regimen to continue  Dispo: The patient is from: Nursing home              Anticipated d/c is to: Back to nursing home in 1 to 2 days after GI clearance              Patient currently is not medically stable to d/c.   Difficult to place patient No     Infusions:    Scheduled Meds:  baclofen  5 mg Oral BID   bisacodyl  10 mg Oral Daily   DULoxetine  30 mg Oral Daily   enoxaparin (LOVENOX) injection  40 mg Subcutaneous Q24H   hydrochlorothiazide  12.5 mg Oral Daily   insulin aspart  0-9 Units Subcutaneous TID WC   levETIRAcetam  2,000 mg Oral BID   levothyroxine  75 mcg Oral QAC breakfast   lidocaine  1 patch Transdermal Q24H   losartan  50 mg Oral Daily   metoCLOPramide (REGLAN) injection  10 mg Intravenous Q6H   multivitamin  1 tablet Oral Daily   nystatin  5 mL Oral QID   phenytoin  100 mg Oral Daily   phenytoin  150 mg Oral BID   polyethylene glycol  17 g Oral TID   polyethylene glycol-electrolytes  4,000 mL Oral Once   pregabalin  50 mg Oral QHS   valproic acid  500 mg Oral QID    PRN meds: acetaminophen **OR** acetaminophen   Antimicrobials: Anti-infectives (From admission, onward)    None       Objective: Vitals:   06/04/21 0530 06/04/21 0806  BP: 118/87 115/76  Pulse: (!) 59 61  Resp: 18 16  Temp: 97.7 F (36.5 C) (!) 97.5 F (36.4 C)  SpO2: 98% 97%    Intake/Output Summary (Last 24 hours) at 06/04/2021 1206 Last data filed at 06/04/2021 0936 Gross per 24 hour  Intake 100 ml  Output 500 ml  Net -400 ml   Filed Weights   05/29/21 2254  Weight: 78.3 kg   Weight change:  Body mass index is 32.62 kg/m.   Physical Exam: General exam: Pleasant, elderly Caucasian female.  Not in physical distress  skin: No rashes, lesions or ulcers. HEENT: Atraumatic, normocephalic, no obvious bleeding Lungs: Clear to auscultation bilaterally CVS: Regular rate and rhythm, no murmur GI/Abd soft, continues to have distention and  tenderness, bowel sound present CNS: Awake, alert, not restless or agitated.  Mumbling.  Baseline cognitive impairment  psychiatry: Sad affect Extremities: No pedal edema, no calf tenderness  Data Review: I have personally reviewed the laboratory data and studies available.  F/u labs ordered Unresulted Labs (From admission, onward)    None       Signed, Lorin Glass, MD [facility] 06/04/2021

## 2021-06-05 DIAGNOSIS — K5981 Ogilvie syndrome: Secondary | ICD-10-CM | POA: Diagnosis not present

## 2021-06-05 DIAGNOSIS — K5900 Constipation, unspecified: Secondary | ICD-10-CM | POA: Diagnosis not present

## 2021-06-05 LAB — GLUCOSE, CAPILLARY
Glucose-Capillary: 78 mg/dL (ref 70–99)
Glucose-Capillary: 83 mg/dL (ref 70–99)
Glucose-Capillary: 99 mg/dL (ref 70–99)
Glucose-Capillary: 99 mg/dL (ref 70–99)
Glucose-Capillary: 85 mg/dL (ref 70–99)

## 2021-06-05 MED ORDER — BISACODYL 5 MG PO TBEC
20.0000 mg | DELAYED_RELEASE_TABLET | Freq: Once | ORAL | Status: AC
  Administered 2021-06-05: 20 mg via ORAL
  Filled 2021-06-05: qty 4

## 2021-06-05 MED ORDER — POLYETHYLENE GLYCOL 3350 17 G PO PACK
34.0000 g | PACK | Freq: Three times a day (TID) | ORAL | Status: DC
  Administered 2021-06-05: 34 g via ORAL
  Filled 2021-06-05: qty 2

## 2021-06-05 NOTE — Plan of Care (Signed)

## 2021-06-05 NOTE — Progress Notes (Signed)
PT Cancellation Note  Patient Details Name: Kathleen Jensen MRN: [id number] DOB: Jan 30, 1951   Cancelled Treatment:    Reason Eval/Treat Not Completed: Patient at procedure or test/unavailable.  Pt is receiving enema, will retry at another time.   Ivar Drape 06/05/2021, 2:23 PM  Samul Dada, PT PhD Acute Rehab Dept. Number: [facility] [id number] and [facility] [phone]

## 2021-06-05 NOTE — Progress Notes (Addendum)
Attending physician's note   I have reviewed the chart and discussed her care on rounds. I agree with the Advanced Practitioner's note, impression, and recommendations as outlined.  - Check BMP, magnesium, phosphorus with a.m. labs. - Continue aggressive management of constipation as outlined  Gerrit Heck, DO, FACG [phone] office               Daily Rounding Note  06/05/2021, 10:46 AM  LOS: 5 days   SUBJECTIVE:   Chief complaint:  obstipation, constipation    Staff met nasal resistance in attempt to place NGT and pt upset by attempt, no NGT not placed.  Pt has drunk almost all golyelty, 2 inches remain in jug.  + flatus but no BM.  No abd pain.    OBJECTIVE:         Vital signs in last 24 hours:    Temp:  [97.6 F (36.4 C)-98.2 F (36.8 C)] 97.6 F (36.4 C) (11/08 0700) Pulse Rate:  [58-59] 59 (11/08 0700) Resp:  [16-17] 17 (11/08 0700) BP: (106-141)/(73-99) 141/99 (11/08 0700) SpO2:  [96 %-100 %] 97 % (11/08 0700) Last BM Date: 05/31/21 Filed Weights   05/29/21 2254  Weight: 78.3 kg   General: comfortable , alert, looks generally unwell.     Heart: RRR Chest: no cough or labored breathing.   Abdomen: soft, NT, active BS, no masses.  Rectal: firm, light brown stool palpable  Extremities: + non-pitting LE edema Neuro/Psych:  not oriented.  Follows commands.  R hemiparesis.    Intake/Output from previous day: 11/07 0701 - 11/08 0700 In: 300 [P.O.:300] Out: 300 [Urine:300]  Intake/Output this shift: Total I/O In: -  Out: 200 [Urine:200]  Lab Results: No results for input(s): WBC, HGB, HCT, PLT in the last 72 hours. BMET Recent Labs    06/02/21 1843 06/03/21 0106  NA  --  133*  K 3.7 4.1  CL  --  102  CO2  --  22  GLUCOSE  --  91  BUN  --  19  CREATININE  --  0.86  CALCIUM  --  8.4*   LFT No results for input(s): PROT, ALBUMIN, AST, ALT, ALKPHOS, BILITOT, BILIDIR, IBILI in the last  72 hours. PT/INR No results for input(s): LABPROT, INR in the last 72 hours. Hepatitis Panel No results for input(s): HEPBSAG, HCVAB, HEPAIGM, HEPBIGM in the last 72 hours.  Studies/Results: DG Abd Portable 1V  Result Date: 06/03/2021 CLINICAL DATA:  Constipation.  Abdominal pain. EXAM: PORTABLE ABDOMEN - 1 VIEW COMPARISON:  Multiple prior radiographs most recently 06/02/2021, yesterday. CT 05/29/2021 FINDINGS: No significant change in the large colonic stool burden. There is also mild gaseous colonic distension. No evidence of small bowel dilatation or small bowel obstruction. Stable pelvic calcifications corresponding to fibroids. Chain sutures noted in the left abdomen. IMPRESSION: No significant change in colonic distension with large volume of stool and air. No small bowel dilatation or obstruction Electronically Signed   By: Keith Rake M.D.   On: 06/03/2021 15:39    Scheduled Meds: . baclofen  5 mg Oral BID  . bisacodyl  10 mg Oral Daily  . DULoxetine  30 mg Oral Daily  . enoxaparin (LOVENOX) injection  40 mg Subcutaneous Q24H  . hydrochlorothiazide  12.5 mg Oral Daily  . insulin aspart  0-9 Units Subcutaneous TID WC  . levETIRAcetam  2,000 mg Oral BID  . levothyroxine  75 mcg Oral QAC breakfast  . lidocaine  1 patch  Transdermal Q24H  . losartan  50 mg Oral Daily  . multivitamin  1 tablet Oral Daily  . nystatin  5 mL Oral QID  . phenytoin  100 mg Oral Daily  . phenytoin  150 mg Oral BID  . polyethylene glycol  17 g Oral TID  . pregabalin  50 mg Oral QHS  . valproic acid  500 mg Oral QID   Continuous Infusions: PRN Meds:.acetaminophen **OR** acetaminophen   ASSESMENT:   Constipation, obstipation.  Refractory to management thus far which has included po Dulcolax, 3 times daily MiraLAX, tap water enema.      Hypokalemia, corrected/resolved   PLAN     Soap suds enemas.  Dulcolax 20 mg now.  Continue miralax.      Azucena Freed  06/05/2021, 10:46 AM Phone [phone]

## 2021-06-05 NOTE — Progress Notes (Signed)
PROGRESS NOTE  Kathleen Jensen  DOB: 1951-07-20  PCP: Donato Schultz, DO SWH:[id number]  DOA: 05/28/2021  LOS: 5 days  Hospital Day: 9  Chief Complaint  Patient presents with   Abdominal Pain   Brief narrative: Kathleen Jensen is a 70 y.o. 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 Patient was sent to the ED from nursing home on 10/31 with complaint of worsening abdominal pain, constipation and inability to tolerate oral intake for 1 week.  She was unsure when her last bowel movement was.  In the ED, patient was hemodynamically stable. Manual disimpaction was tried without success. Admitted to hospital service GI consultation was obtained. CT abdomen pelvis showed large burden burden of stool throughout the colon and rectum, stool balls in the rectum.  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  Small air-fluid level in the urinary bladder.  Correlate for recent catheterization.  Status post Roux type gastric bypass. In ED patient made n.p.o., given 1.5 normal saline bolus. Attempted manual disimpaction.   General surgery and GI were consulted. Admitted to [facility].  Subjective: Patient was seen and examined this morning.   Lying in bed.  Alert, awake, able to answer simple questions. Did not tolerate NG tube placement.  Has been drinking GoLytely, did not complain though. No bowel movement however.  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  Currently on MiraLAX, Dulcolax, tapwater enema.    -GoLytely tried by mouth in last 24 hours.  Did not tolerate NG tube placement.  May need more gladly/enema.  Defer to GI.   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   -Patient continues to have poor oral intake.  Remains on clear liquid diet per GI recommendation.  Encourage oral intake. -Speech therapy evaluation obtained.  Type 2 diabetes mellitus -A1c 5.5 in 11/2.   -Currently on sliding scale insulin.  Blood sugar level low at 68 this morning.  Currently on soft diet with no calorie restriction.  Continue to monitor. Recent Labs  Lab 06/04/21 1133 06/04/21 1629 06/04/21 2032 06/05/21 0640 06/05/21 0718  GLUCAP 136* 87 99 78 83    History of CVA with right hemiplegia and aphasia  Hyperlipidemia -at baseline, patient has right hemiplegia, aphasia and mild cognitive disorder as well. -SLP swallow eval was obtained.  Soft diet -Unclear why patient is not on any antiplatelet or anticoagulant.  Need to be addressed as an outpatient.  Continue simvastatin  Essential hypertension -Continue losartan/HCTZ.  Seizures Anxiety/depression -seizure precautions -continue AED: kepra, dilantin, depakene, Lyrica, Cymbalta, baclofen    Hypothyroidism -continue synthroid    OSA -on no cpap or oxygen at night   Impaired mobility -Minimal mobility.  Requires a lift to mobilize.  PT eval obtained.   -Palliative care consulted for goals of care.     Mobility: Minimal mobility Living condition: Nursing home Goals of care:   Code Status: Full Code  Nutritional status: Body mass index is 32.62 kg/m.     Diet:  Diet Order             DIET DYS 3 Room service appropriate?  Yes; Fluid consistency: Thin  Diet effective now                  DVT prophylaxis: Lovenox subcu enoxaparin (LOVENOX) injection 40 mg Start: 05/29/21 1515   Antimicrobials: None Fluid: None Consultants: GI Family Communication:  None at bedside  Status is: Inpatient  Remains inpatient appropriate because: Continues to have severe constipation.  Bowel regimen to continue  Dispo: The patient is from: Nursing home              Anticipated d/c is to: Back to nursing home in 1 to 2 days after GI clearance              Patient currently is not medically stable to d/c.   Difficult to place patient No     Infusions:    Scheduled Meds:  baclofen  5 mg Oral BID   bisacodyl  10 mg Oral Daily   DULoxetine  30 mg Oral Daily   enoxaparin (LOVENOX) injection  40 mg Subcutaneous Q24H   hydrochlorothiazide  12.5 mg Oral Daily   insulin aspart  0-9 Units Subcutaneous TID WC   levETIRAcetam  2,000 mg Oral BID   levothyroxine  75 mcg Oral QAC breakfast   lidocaine  1 patch Transdermal Q24H   losartan  50 mg Oral Daily   multivitamin  1 tablet Oral Daily   nystatin  5 mL Oral QID   phenytoin  100 mg Oral Daily   phenytoin  150 mg Oral BID   polyethylene glycol  17 g Oral TID   pregabalin  50 mg Oral QHS   valproic acid  500 mg Oral QID    PRN meds: acetaminophen **OR** acetaminophen   Antimicrobials: Anti-infectives (From admission, onward)    None       Objective: Vitals:   06/04/21 2033 06/05/21 0700  BP: (!) 127/95 (!) 141/99  Pulse: (!) 58 (!) 59  Resp: 17 17  Temp: 98.2 F (36.8 C) 97.6 F (36.4 C)  SpO2: 100% 97%    Intake/Output Summary (Last 24 hours) at 06/05/2021 1058 Last data filed at 06/05/2021 0900 Gross per 24 hour  Intake 200 ml  Output 500 ml  Net -300 ml    Filed Weights   05/29/21 2254  Weight: 78.3 kg   Weight change:  Body mass index is 32.62 kg/m.   Physical Exam: General exam: Pleasant, elderly Caucasian female.  Not in physical distress  skin: No rashes, lesions or ulcers. HEENT: Atraumatic, normocephalic, no obvious bleeding Lungs: Clear to auscultation bilaterally CVS: Regular rate and rhythm, no murmur GI/Abd soft, continues to have distention and  tenderness, bowel sound present CNS: Awake, alert, not restless or agitated.  Mumbling.  Baseline cognitive impairment  psychiatry: Sad affect Extremities: No pedal edema, no calf tenderness  Data Review: I have personally reviewed the laboratory data and studies available.  F/u labs ordered Unresulted Labs (From admission, onward)    None       Signed, Lorin Glass, MD [facility] 06/05/2021

## 2021-06-06 ENCOUNTER — Inpatient Hospital Stay (HOSPITAL_COMMUNITY): Payer: Medicare HMO

## 2021-06-06 DIAGNOSIS — K5981 Ogilvie syndrome: Secondary | ICD-10-CM | POA: Diagnosis not present

## 2021-06-06 DIAGNOSIS — K5900 Constipation, unspecified: Secondary | ICD-10-CM | POA: Diagnosis not present

## 2021-06-06 LAB — PHOSPHORUS: Phosphorus: 3.9 mg/dL (ref 2.5–4.6)

## 2021-06-06 LAB — BASIC METABOLIC PANEL
Anion gap: 6 (ref 5–15)
BUN: 13 mg/dL (ref 8–23)
CO2: 27 mmol/L (ref 22–32)
Calcium: 8.9 mg/dL (ref 8.9–10.3)
Chloride: 103 mmol/L (ref 98–111)
Creatinine, Ser: 0.71 mg/dL (ref 0.44–1.00)
GFR, Estimated: >60 mL/min (ref >60)
Glucose, Bld: 104 mg/dL — ABNORMAL HIGH (ref 70–99)
Potassium: 3.4 mmol/L — ABNORMAL LOW (ref 3.5–5.1)
Sodium: 136 mmol/L (ref 135–145)

## 2021-06-06 LAB — GLUCOSE, CAPILLARY
Glucose-Capillary: 74 mg/dL (ref 70–99)
Glucose-Capillary: 95 mg/dL (ref 70–99)
Glucose-Capillary: 89 mg/dL (ref 70–99)
Glucose-Capillary: 91 mg/dL (ref 70–99)

## 2021-06-06 LAB — MAGNESIUM: Magnesium: 2.1 mg/dL (ref 1.7–2.4)

## 2021-06-06 MED ORDER — POLYETHYLENE GLYCOL 3350 17 G PO PACK: 34.0000 g | PACK | Freq: Two times a day (BID) | ORAL | 0 refills | Status: DC

## 2021-06-06 MED ORDER — PHENYTOIN 125 MG/5ML PO SUSP: 100.0000 mg | Freq: Every day | ORAL | 12 refills | Status: DC

## 2021-06-06 MED ORDER — PHENYTOIN 125 MG/5ML PO SUSP: 150.0000 mg | Freq: Two times a day (BID) | ORAL | 12 refills | Status: DC

## 2021-06-06 MED ORDER — BISACODYL 5 MG PO TBEC: 5.0000 mg | DELAYED_RELEASE_TABLET | Freq: Two times a day (BID) | ORAL | 0 refills | Status: DC

## 2021-06-06 MED ORDER — POTASSIUM CHLORIDE CRYS ER 20 MEQ PO TBCR
40.0000 meq | EXTENDED_RELEASE_TABLET | Freq: Once | ORAL | Status: AC
  Administered 2021-06-06: 40 meq via ORAL
  Filled 2021-06-06: qty 2

## 2021-06-06 NOTE — TOC Transition Note (Signed)
Transition of Care [facility]) - CM/SW Discharge Note   Patient Details  Name: Kathleen Jensen MRN: [id number] Date of Birth: 08/17/1950  Transition of Care [facility]) CM/SW Contact:  Ralene Bathe, LCSWA Phone Number: 06/06/2021, 2:48 PM   Clinical Narrative:    Patient will DC to: [name]  Anticipated DC date:  06/06/2021 Family notified:  Yes Transport by: [name]   Per MD patient ready for DC to SNF. RN to call report prior to discharge [phone]. RN, patient, patient's family, and facility notified of DC. Discharge Summary and FL2 sent to facility. DC packet on chart. Ambulance transport requested for patient.   CSW will sign off for now as social work intervention is no longer needed. Please consult [place] again if new needs arise.           Patient Goals and CMS Choice        Discharge Placement                       Discharge Plan and Services                                     Social Determinants of Health (SDOH) Interventions     Readmission Risk Interventions No flowsheet data found.

## 2021-06-06 NOTE — TOC Progression Note (Signed)
Transition of Care [facility]) - Initial/Assessment Note    Patient Details  Name: Kathleen Jensen MRN: [id number] Date of Birth: 1950-08-23  Transition of Care [facility]) CM/SW Contact:    Ralene Bathe, LCSWA Phone Number: 06/06/2021, 1:15 PM  Clinical Narrative:                  CSW contacted Star with [name] to inform the facility that the patient is medically ready to return.  CSW is waiting for a response with transfer information for the patient.        Patient Goals and CMS Choice        Expected Discharge Plan and Services           Expected Discharge Date: 06/06/21                                    Prior Living Arrangements/Services                       Activities of Daily Living      Permission Sought/Granted                  Emotional Assessment              Admission diagnosis:  Constipation, unspecified constipation type [K59.00] Ogilvie syndrome [K59.81] Patient Active Problem List   Diagnosis Date Noted   Constipation    Abdominal pain    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   Right hemiplegia (HCC) 04/12/2020   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   Hyperlipidemia associated with type 2 diabetes mellitus (HCC) 07/30/2018   History of cerebral infarction 07/30/2018   Vitamin B 12 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/30/2018   Asthma 03/13/2018   Seizures (HCC) 01/12/2018   CVA (cerebral vascular accident) (HCC) 12/08/2017   Abnormal thyroid blood test 12/08/2017   Pulmonary embolism and infarction (HCC) 12/08/2017   Anemia 12/08/2017   Expressive aphasia 10/15/2017   Seizure disorder (HCC) 10/15/2017   Dysphagia 10/15/2017   Diabetes mellitus without complication (HCC)    Hyperlipidemia 09/22/2017   Urinary frequency 09/22/2017   Airway hyperreactivity 01/26/2016   Disease of thyroid gland 01/26/2016   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   Other specified postprocedural states  01/26/2016   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   Chronic LBP 04/10/2015   Degeneration of intervertebral disc of lumbar region 04/10/2015   Spondylolisthesis of lumbar region 04/10/2015   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   History of cardiac pacemaker in situ 01/09/2015   B12 DEFICIENCY 08/15/2008   UNSPECIFIED VITAMIN D DEFICIENCY 08/15/2008   SINUS BRADYCARDIA 08/15/2008   OBSTRUCTIVE SLEEP APNEA 08/11/2008   INSOMNIA 04/28/2008   Hyperlipidemia LDL goal <70 12/12/2006   RHINITIS, ALLERGIC NEC 12/12/2006   Hypothyroidism 12/11/2006   Essential hypertension 12/11/2006   GERD 12/11/2006   STRESS INCONTINENCE 12/11/2006   PCP:  Donato Schultz, DO Pharmacy:   [facility] - [place], [place] - [address] [place] [address] Phone: [phone] Fax: [phone]  [facility] [address], [address] [place] [address] Phone: [phone] Fax: [phone]     Social Determinants of Health (SDOH) Interventions    Readmission Risk Interventions No flowsheet data found.

## 2021-06-06 NOTE — Discharge Summary (Signed)
Physician Discharge Summary  Kathleen Jensen KGU:[id number] DOB: March 02, 1951 DOA: 05/28/2021  PCP: Donato Schultz, DO  Admit date: 05/28/2021 Discharge date: 06/06/2021  Admitted From: [facility] Discharge disposition: [name]   Code Status: Full Code   Discharge Diagnosis:  Principal Problem:   Ogilvie syndrome Active Problems:   Hypothyroidism   OBSTRUCTIVE SLEEP APNEA   Essential hypertension   GERD   Hyperlipidemia   Diabetes mellitus without complication (HCC)   CVA (cerebral vascular accident) (HCC)   Seizures (HCC)   Right hemiplegia (HCC)   Constipation   Abdominal pain   Chief Complaint  Patient presents with   Abdominal Pain   Brief narrative: Kathleen Jensen is a 70 y.o. 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 Patient was sent to the ED from nursing home on 10/31 with complaint of worsening abdominal pain, constipation and inability to tolerate oral intake for 1 week.  She was unsure when her last bowel movement was.  In the ED, patient was hemodynamically stable. Manual disimpaction was tried without success. Admitted to hospital service GI consultation was obtained. CT abdomen pelvis showed large burden burden of stool throughout the colon and rectum, stool balls in the rectum.  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  Small air-fluid level in the urinary bladder.  Correlate for recent catheterization.  Status post Roux type gastric bypass. In ED patient made n.p.o., given 1.5 normal saline bolus. Attempted manual disimpaction.   General surgery and GI were consulted. Admitted to [facility].  Subjective: Patient was seen and  examined this morning.   Lying down in bed.  Not in distress.  No new symptoms.  Alert, awake.   Per nursing staff, she had a large bowel movement yesterday after which feels better after abdominal distention and pain is improved.   Hospital course: Ogilvie syndrome Obstipation in patient with chronic constipation - presented with worsening stomach pain, decreased PO intake and worsening constipation found to have Ogilvie syndrome by CT -GI consult appreciated.  She was tried on every day MiraLAX, Dulcolax, tapwater enema despite which, she did not have bowel movement. -On 11/7, she was started on GoLytely after which she had significant amount of stool output. -We will decide the patient back to SNF on Senokot twice daily, MiraLAX twice daily, Dulcolax twice daily and as needed enema.   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   -Speech therapy evaluation was obtained.  Currently on dysphagia diet.  Hypokalemia -Potassium level low at 3.4 today.  Replacement given.  Follow-up repeat potassium level in next 1 to 2 weeks. Recent Labs  Lab 06/01/21 0243 06/02/21 0541 06/02/21 1843 06/03/21 0106 06/06/21 0908  K 4.4 2.9* 3.7 4.1 3.4*  MG  --   --   --   --  2.1  PHOS  --   --   --   --  3.9   Type 2 diabetes mellitus -A1c 5.5 in 11/2.   -Currently on sliding scale insulin. Currently on soft diet with no calorie restriction.  Continue to monitor. Recent Labs  Lab 06/05/21 1220 06/05/21 1711 06/05/21 1951 06/06/21 0832 06/06/21 1148  GLUCAP 99 99 85 74 95   History of CVA with  right hemiplegia and aphasia  Hyperlipidemia -at baseline, patient has right hemiplegia, aphasia and mild cognitive disorder as well. -SLP swallow eval was obtained.  Soft diet -Unclear why patient is not on any antiplatelet or anticoagulant.  Need to be addressed as an outpatient.  -Continue simvastatin  Essential  hypertension -Continue losartan/HCTZ.  Seizures Anxiety/depression -seizure precautions -continue AED: kepra, dilantin, depakene, Lyrica, Cymbalta, baclofen    Hypothyroidism -continue synthroid    OSA -on no cpap or oxygen at night   Impaired mobility -Minimal mobility.  Requires a lift to mobilize.  PT eval obtained.   -Palliative care consulted for goals of care.      Allergies as of 06/06/2021       Reactions   Tape Rash   Meperidine Hcl Nausea And Vomiting   Sulfonamide Derivatives Itching   Bacitracin-polymyxin B Dermatitis   "Cloth Band-Aid only"   Oxycodone Itching        Medication List     STOP taking these medications    bisacodyl 10 MG suppository Commonly known as: DULCOLAX Replaced by: bisacodyl 5 MG EC tablet   phenytoin 100 MG ER capsule Commonly known as: DILANTIN       TAKE these medications    acetaminophen 500 MG tablet Commonly known as: TYLENOL Take 1,000 mg by mouth every 8 (eight) hours.   B Complex-C-Folic Acid Tabs Take 1 tablet by mouth daily.   Baclofen 5 MG Tabs Take 5 mg by mouth in the morning and at bedtime.   bisacodyl 5 MG EC tablet Commonly known as: DULCOLAX Take 1 tablet (5 mg total) by mouth in the morning and at bedtime. Replaces: bisacodyl 10 MG suppository   CALCIUM CITRATE PO Take 200 mg by mouth in the morning and at bedtime.   DULoxetine 30 MG capsule Commonly known as: CYMBALTA Take 30 mg by mouth daily.   levETIRAcetam 1000 MG tablet Commonly known as: Keppra Take 2 tablets (2,000 mg total) by mouth 2 (two) times daily.   levothyroxine 75 MCG tablet Commonly known as: SYNTHROID Take 75 mcg by mouth daily before breakfast.   Lidocaine 4 % Ptch Apply 1 patch topically daily.   losartan-hydrochlorothiazide 50-12.5 MG tablet Commonly known as: HYZAAR Take 1 tablet by mouth daily.   ondansetron 4 MG disintegrating tablet Commonly known as: ZOFRAN-ODT Take 4 mg by mouth in the morning, at  noon, and at bedtime.   phenytoin 125 MG/5ML suspension Commonly known as: DILANTIN Take 6 mLs (150 mg total) by mouth 2 (two) times daily. What changed:  how much to take when to take this additional instructions   phenytoin 125 MG/5ML suspension Commonly known as: DILANTIN Take 4 mLs (100 mg total) by mouth daily. What changed: You were already taking a medication with the same name, and this prescription was added. Make sure you understand how and when to take each.   polyethylene glycol 17 g packet Commonly known as: MIRALAX / GLYCOLAX Take 34 g by mouth 2 (two) times daily. What changed:  how much to take when to take this additional instructions   pregabalin 50 MG capsule Commonly known as: LYRICA Take 50 mg by mouth at bedtime.   Probiotic Acidophilus Caps Take 1 capsule by mouth daily.   senna-docusate 8.6-50 MG tablet Commonly known as: Senokot-S Take 2 tablets by mouth 2 (two) times daily.   simvastatin 10 MG tablet Commonly known as: ZOCOR Take 10 mg by mouth daily.   valproic acid 250 MG/5ML solution Commonly  known as: DEPAKENE Take 500 mg by mouth 4 (four) times daily.               Discharge Care Instructions  (From admission, onward)           Start     Ordered   06/06/21 0000  Discharge wound care:       Comments: Change position frequently to relieve pressure.   06/06/21 1012            Discharge Instructions:  Diet Recommendation:  Discharge Diet Orders (From admission, onward)     Start     Ordered   06/06/21 0000  Diet general       Comments: Diet Orders (From admission, onward)    Start     Ordered   06/01/21 1045  DIET DYS 3 Room service appropriate? Yes; Fluid  consistency: Thin  Diet effective now       Question Answer Comment  Room service appropriate? Yes   Fluid consistency: Thin     06/01/21 1044       06/06/21 1012            @  Follow ups:    Follow-up Information      Donato Schultz, DO Follow up.   Specialty: Family Medicine Contact information: [address] [place] [address] [phone]                 Wound care:   Pressure Injury 05/31/21 Sacrum Mid;Bilateral Open area- painful to touch (Active)  Date First Assessed/Time First Assessed: 05/31/21 0830   Location: Sacrum  Location Orientation: Mid;Bilateral  Wound Description (Comments): Open area- painful to touch    Assessments 05/31/2021  8:30 AM 06/06/2021  7:25 AM  Dressing Type Moisture barrier;Foam - Lift dressing to assess site every shift Foam - Lift dressing to assess site every shift  Dressing Clean;Dry;Intact Clean;Dry;Intact  Site / Wound Assessment Red;Painful Dressing in place / Unable to assess  Peri-wound Assessment Maceration --  Drainage Amount None None  Treatment Cleansed;Other (Comment) --     No Linked orders to display    Discharge Exam:   Vitals:   06/05/21 1949 06/06/21 0542 06/06/21 0759 06/06/21 1205  BP: 130/77 140/76 (!) 141/78 133/85  Pulse: 60 61 60 (!) 58  Resp: Temp: 97.7 F (36.5 C) (!) 97.3 F (36.3 C) 98.2 F (36.8 C) 98.1 F (36.7 C)  TempSrc:   Oral Oral  SpO2: 100% 98% 97% 99%  Weight:      Height:        Body mass index is 32.62 kg/m.  General exam: Pleasant, elderly.  Not in distress today Skin: No rashes, lesions or ulcers. HEENT: Atraumatic, normocephalic, no obvious bleeding Lungs: Clear to auscultation bilaterally CVS: Regular rate and rhythm, no murmur GI/Abd soft, distention improved, tenderness improved, bowel sound present CNS: Alert, awake, able to answer simple questions.  Chronic cognitive impairment Psychiatry: Mood appropriate Extremities: No pedal edema, no calf tenderness  Time coordinating discharge: 35 minutes   The results of significant diagnostics from this hospitalization (including imaging, microbiology, ancillary and laboratory) are listed below for reference.     Procedures and Diagnostic Studies:   CT Head Wo Contrast  Result Date: 05/28/2021 CLINICAL DATA:  Mental status change, unknown cause EXAM: CT HEAD WITHOUT CONTRAST TECHNIQUE: Contiguous axial images were obtained from the base of the skull through the vertex without intravenous contrast.  COMPARISON:  CT head 04/11/2020 BRAIN: BRAIN Patchy and confluent areas of decreased attenuation are noted throughout the deep and periventricular white matter of the cerebral hemispheres bilaterally, compatible with chronic microvascular ischemic disease. Patchy and confluent areas of decreased attenuation are noted throughout the deep and periventricular white matter of the cerebral hemispheres bilaterally, compatible with chronic microvascular ischemic disease. Left parietal encephalomalacia. No evidence of large-territorial acute infarction. No parenchymal hemorrhage. No mass lesion. No extra-axial collection. No mass effect or midline shift. No hydrocephalus. Basilar cisterns are patent. Vascular: No hyperdense vessel. Atherosclerotic calcifications are present within the cavernous internal carotid arteries. Skull: No acute fracture or focal lesion. Sinuses/Orbits: Paranasal sinuses and mastoid air cells are clear. The orbits are unremarkable. Other: None. IMPRESSION: No acute intracranial abnormality. Electronically Signed   By: Tish Frederickson M.D.   On: 05/28/2021 19:16   CT ABDOMEN PELVIS W CONTRAST  Result Date: 05/29/2021 CLINICAL DATA:  Abdominal pain, constipation, altered mental status EXAM: CT ABDOMEN AND PELVIS WITH CONTRAST TECHNIQUE: Multidetector CT imaging of the abdomen and pelvis was performed using the standard protocol following bolus administration of intravenous contrast. CONTRAST:  60mL OMNIPAQUE IOHEXOL 300 MG/ML  SOLN COMPARISON:  08/06/2018 FINDINGS: Lower chest: No acute abnormality. Hepatobiliary: No focal liver abnormality is seen. Status post cholecystectomy. Mild postoperative biliary  dilatation. Pancreas: Unremarkable. No pancreatic ductal dilatation or surrounding inflammatory changes. Spleen: Normal in size without significant abnormality. Adrenals/Urinary Tract: Adrenal glands are unremarkable. Kidneys are normal, without renal calculi, solid lesion, or hydronephrosis. Small air-fluid level in the urinary bladder. Stomach/Bowel: Status post Roux type gastric bypass. Appendix appears normal. No evidence of bowel wall thickening, distention, or inflammatory changes. Large burden of stool throughout the colon and rectum, with stool balls in the rectum. The patulous sigmoid colon is mildly distended, measuring up to 7.0 cm. Vascular/Lymphatic: Aortic atherosclerosis. No enlarged abdominal or pelvic lymph nodes. Reproductive: Calcified uterine fibroids. Other: No abdominal wall hernia or abnormality. No abdominopelvic ascites. Musculoskeletal: No acute or significant osseous findings. IMPRESSION: 1. Large burden of stool throughout the colon and rectum, with stool balls in the rectum. 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 2. Small air-fluid level in the urinary bladder. Correlate for recent catheterization. 3. Status post Roux type gastric bypass. 4. Uterine fibroids. Aortic Atherosclerosis (ICD10-I70.0). Electronically Signed   By: Jearld Lesch M.D.   On: 05/29/2021 10:06   DG Chest Portable 1 View  Result Date: 05/29/2021 CLINICAL DATA:  Abdominal pain. EXAM: PORTABLE CHEST 1 VIEW COMPARISON:  April 12, 2020. FINDINGS: The heart size and mediastinal contours are within normal limits. Left-sided cardiac device in situ is unchanged in position. Both lungs are clear. The visualized skeletal structures are unremarkable. IMPRESSION: No active disease. Aortic Atherosclerosis (ICD10-I70.0). Electronically Signed   By: Lupita Raider M.D.   On: 05/29/2021 08:48     Labs:   Basic Metabolic  Panel: Recent Labs  Lab 05/31/21 0238 06/01/21 0243 06/02/21 0541 06/02/21 1843 06/03/21 0106 06/06/21 0908  NA 135 135 134*  --  133* 136  K 3.6 4.4 2.9*   < > 4.1 3.4*  CL 102 102 100  --  102 103  CO2 26 23 25   --  22 27  GLUCOSE 90 85 94  --  91 104*  BUN 11 11 12   --  19 13  CREATININE 0.70 0.66 0.74  --  0.86 0.71  CALCIUM 8.4* 8.7* 8.5*  --  8.4* 8.9  MG  --   --   --   --   --  2.1  PHOS  --   --   --   --   --  3.9   < > = values in this interval not displayed.   GFR Estimated Creatinine Clearance: 62 mL/min (by C-G formula based on SCr of 0.71 mg/dL). Liver Function Tests: No results for input(s): AST, ALT, ALKPHOS, BILITOT, PROT, ALBUMIN in the last 168 hours. No results for input(s): LIPASE, AMYLASE in the last 168 hours. No results for input(s): AMMONIA in the last 168 hours. Coagulation profile No results for input(s): INR, PROTIME in the last 168 hours.  CBC: Recent Labs  Lab 05/31/21 0238 06/01/21 0243 06/02/21 0541  WBC 7.5 11.6* 9.8  NEUTROABS 2.9 4.9 3.8  HGB 14.1 15.1* 14.1  HCT 41.4 45.1 42.0  MCV 91.0 90.7 90.5  PLT 171 168 207   Cardiac Enzymes: No results for input(s): CKTOTAL, CKMB, CKMBINDEX, TROPONINI in the last 168 hours. BNP: Invalid input(s): POCBNP CBG: Recent Labs  Lab 06/05/21 1220 06/05/21 1711 06/05/21 1951 06/06/21 0832 06/06/21 1148  GLUCAP 99 99 85 74 95   D-Dimer No results for input(s): DDIMER in the last 72 hours. Hgb A1c No results for input(s): HGBA1C in the last 72 hours. Lipid Profile No results for input(s): CHOL, HDL, LDLCALC, TRIG, CHOLHDL, LDLDIRECT in the last 72 hours. Thyroid function studies No results for input(s): TSH, T4TOTAL, T3FREE, THYROIDAB in the last 72 hours.  Invalid input(s): FREET3 Anemia work up No results for input(s): VITAMINB12, FOLATE, FERRITIN, TIBC, IRON, RETICCTPCT in the last 72 hours. Microbiology No results found for this or any previous visit (from the past 240  hour(s)).   Signed: Lorin Glass  [facility] 06/06/2021, 12:49 PM

## 2021-06-06 NOTE — Progress Notes (Signed)
Physical Therapy Treatment Patient Details Name: Kathleen Jensen MRN: [id number] DOB: 1950/11/02 Today's Date: 06/06/2021   History of Present Illness Patient is a 70 y/o female who presents on 05/28/21 with abdominal pain, decreased PO intake and N/V. CT abdomen- large  burden of stool throughout the colon and rectum, stool balls in the rectum.  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 PMH includes ICH, seizures, right hemiparesis, CKD stage III, expressive aphasia, cognitive communication deficit, history of PE.    PT Comments    Pt admitted with above diagnosis. Pt continues to need total to max assist of 2 for bed mobility and transfers.  Appears that pt is at baseline therefore will provide one more session and if pt is at same level will d/c PT.   Pt currently with functional limitations due to balance and endurance deficits. Pt will benefit from skilled PT to increase their independence and safety with mobility to allow discharge to the venue listed below.      Recommendations for follow up therapy are one component of a multi-disciplinary discharge planning process, led by the attending physician.  Recommendations may be updated based on patient status, additional functional criteria and insurance authorization.  Follow Up Recommendations  Long-term institutional care without follow-up therapy (resident at [facility])     Assistance Recommended at Discharge Frequent or constant Supervision/Assistance  Equipment Recommendations  None recommended by PT    Recommendations for Other Services       Precautions / Restrictions Precautions Precautions: Fall Precaution Comments: right hemiparesis and aphasia Restrictions Weight Bearing Restrictions: No     Mobility  Bed Mobility Overal bed mobility: Needs Assistance Bed Mobility: Rolling;Supine to Sit;Sit to Supine Rolling: Max  assist;+2 for physical assistance   Supine to sit: Max assist;+2 for physical assistance;HOB elevated Sit to supine: Max assist;+2 for physical assistance;HOB elevated   General bed mobility comments: Max A of 2 to roll to right/left; Assist of 2 to get to EOB with pt reaching for rail with LUE to assist.    Transfers Overall transfer level: Needs assistance Equipment used: 2 person hand held assist Transfers: Sit to/from Stand;Bed to chair/wheelchair/BSC Sit to Stand: Max assist;+2 physical assistance;Total assist;From elevated surface          Lateral/Scoot Transfers: Total assist;+2 physical assistance;From elevated surface General transfer comment: Unable to stand or bear weight through LEs despite assist of 2.  total assist of 2  to scoot to drop arm recliner with use of pad.    Ambulation/Gait                   Stairs             Wheelchair Mobility    Modified Rankin (Stroke Patients Only) Modified Rankin (Stroke Patients Only) Pre-Morbid Rankin Score: Severe disability Modified Rankin: Severe disability     Balance Overall balance assessment: Needs assistance Sitting-balance support: Feet supported;Single extremity supported Sitting balance-Leahy Scale: Poor Sitting balance - Comments: Able to sit for short periods but fatigues quickly with LOB posteriorly, using LUE on rail at all times. Postural control: Posterior lean                                  Cognition Arousal/Alertness: Awake/alert Behavior During Therapy: WFL for tasks assessed/performed Overall Cognitive Status: Difficult to assess  General Comments: Difficulty following commands due to aphasia, does well with gestural and demonstrational cues.        Exercises      General Comments        Pertinent Vitals/Pain Pain Assessment: Faces Faces Pain Scale: Hurts whole lot Pain Location: bottom with weight  bearing Pain Descriptors / Indicators: Grimacing;Guarding;Discomfort Pain Intervention(s): Limited activity within patient's tolerance;Monitored during session;Repositioned    Home Living                          Prior Function            PT Goals (current goals can now be found in the care plan section) Acute Rehab PT Goals Patient Stated Goal: unable to state Progress towards PT goals: Progressing toward goals    Frequency    Min 2X/week      PT Plan Current plan remains appropriate    Co-evaluation              AM-PAC PT "6 Clicks" Mobility   Outcome Measure  Help needed turning from your back to your side while in a flat bed without using bedrails?: Total Help needed moving from lying on your back to sitting on the side of a flat bed without using bedrails?: Total Help needed moving to and from a bed to a chair (including a wheelchair)?: Total Help needed standing up from a chair using your arms (e.g., wheelchair or bedside chair)?: Total Help needed to walk in hospital room?: Total Help needed climbing 3-5 steps with a railing? : Total 6 Click Score: 6    End of Session Equipment Utilized During Treatment: Gait belt Activity Tolerance: Patient limited by fatigue;Patient limited by pain Patient left: with call bell/phone within reach;in chair;with chair alarm set Nurse Communication: Mobility status;Need for lift equipment PT Visit Diagnosis: Hemiplegia and hemiparesis;Muscle weakness (generalized) (M62.81) Hemiplegia - Right/Left: Right Hemiplegia - dominant/non-dominant: Dominant Hemiplegia - caused by: Other Nontraumatic intracranial hemorrhage     Time: 0908-0929 PT Time Calculation (min) (ACUTE ONLY): 21 min  Charges:  $Therapeutic Activity: 8-22 mins                     Kathleen Jensen,PT Acute Rehab Services [phone] (pager)    Kathleen Jensen 06/06/2021, 11:07 AM

## 2021-06-06 NOTE — Progress Notes (Addendum)
Attending physician's note   I have reviewed the chart and discussed her care on rounds. I agree with the Advanced Practitioner's note, impression, and recommendations as outlined.   Several BM after aggressive bowel regimen.  Plan to titrate as outlined.  Other than hypokalemia, BMP, mag, Phos all normal.  GI service will sign off at this time.    Tekeshia Klahr, DO, FACG [phone] office         Multiple BM's, flood gate opening type stool output yesterday after 20 mg Dulcolax, Miralax upped to 34 g tid and completing golytely from previous evening.    Pt not re-examined.  Attending has ordered fup KUB  A/P 1) obstipation in pt w chronic constipation.  Suspect element of colonic inertia in bedbouond/post CVA pt. Aggressive bowel regimen at return to SNF: Dulcolax or senokot 2 x daily, Miralax 34 g bid.  Titrate laxatives up/down depending on response.  Could also use enemas.    2) hypokalemia, recurrent Supplement potassium now.  May be risky to add daily potassium as outpt but would have K levels checked weekly for 2 to 3 weeks, and if low would add schedule potassim.     GI signing off.  No plans for office fup.    Jennye Moccasin PA-C

## 2021-10-09 ENCOUNTER — Other Ambulatory Visit: Payer: Self-pay | Admitting: Adult Health

## 2021-10-09 DIAGNOSIS — L98499 Non-pressure chronic ulcer of skin of other sites with unspecified severity: Secondary | ICD-10-CM

## 2021-10-10 ENCOUNTER — Other Ambulatory Visit: Payer: Self-pay | Admitting: Adult Health

## 2021-10-11 ENCOUNTER — Other Ambulatory Visit (HOSPITAL_COMMUNITY): Payer: Self-pay | Admitting: Adult Health

## 2021-10-29 ENCOUNTER — Other Ambulatory Visit: Payer: Self-pay | Admitting: Family Medicine

## 2021-10-29 ENCOUNTER — Other Ambulatory Visit (HOSPITAL_COMMUNITY): Payer: Self-pay | Admitting: Family Medicine

## 2022-02-15 ENCOUNTER — Other Ambulatory Visit: Payer: Self-pay

## 2022-02-15 ENCOUNTER — Encounter (HOSPITAL_COMMUNITY): Payer: Self-pay

## 2022-02-15 ENCOUNTER — Inpatient Hospital Stay (HOSPITAL_COMMUNITY)
Admission: EM | Admit: 2022-02-15 | Discharge: 2022-02-22 | DRG: 698 | Disposition: A | Discharge status: Skilled Nursing Facility | Payer: Medicare HMO | Source: Skilled Nursing Facility | Attending: Internal Medicine | Admitting: Internal Medicine

## 2022-02-15 ENCOUNTER — Emergency Department (HOSPITAL_COMMUNITY): Payer: Medicare HMO

## 2022-02-15 DIAGNOSIS — G8191 Hemiplegia, unspecified affecting right dominant side: Secondary | ICD-10-CM | POA: Diagnosis not present

## 2022-02-15 DIAGNOSIS — I495 Sick sinus syndrome: Secondary | ICD-10-CM | POA: Diagnosis present

## 2022-02-15 DIAGNOSIS — Z20822 Contact with and (suspected) exposure to covid-19: Secondary | ICD-10-CM | POA: Diagnosis present

## 2022-02-15 DIAGNOSIS — E539 Vitamin B deficiency, unspecified: Secondary | ICD-10-CM | POA: Diagnosis present

## 2022-02-15 DIAGNOSIS — E039 Hypothyroidism, unspecified: Secondary | ICD-10-CM | POA: Diagnosis present

## 2022-02-15 DIAGNOSIS — N390 Urinary tract infection, site not specified: Secondary | ICD-10-CM | POA: Diagnosis present

## 2022-02-15 DIAGNOSIS — Z950 Presence of cardiac pacemaker: Secondary | ICD-10-CM | POA: Diagnosis present

## 2022-02-15 DIAGNOSIS — R569 Unspecified convulsions: Secondary | ICD-10-CM | POA: Diagnosis not present

## 2022-02-15 DIAGNOSIS — Z800 Family history of malignant neoplasm of digestive organs: Secondary | ICD-10-CM

## 2022-02-15 DIAGNOSIS — G9341 Metabolic encephalopathy: Secondary | ICD-10-CM | POA: Diagnosis present

## 2022-02-15 DIAGNOSIS — I4891 Unspecified atrial fibrillation: Secondary | ICD-10-CM

## 2022-02-15 DIAGNOSIS — A4159 Other Gram-negative sepsis: Secondary | ICD-10-CM | POA: Diagnosis present

## 2022-02-15 DIAGNOSIS — Z79890 Hormone replacement therapy (postmenopausal): Secondary | ICD-10-CM

## 2022-02-15 DIAGNOSIS — E854 Organ-limited amyloidosis: Secondary | ICD-10-CM | POA: Diagnosis present

## 2022-02-15 DIAGNOSIS — R6521 Severe sepsis with septic shock: Secondary | ICD-10-CM | POA: Diagnosis present

## 2022-02-15 DIAGNOSIS — E785 Hyperlipidemia, unspecified: Secondary | ICD-10-CM | POA: Diagnosis present

## 2022-02-15 DIAGNOSIS — I69351 Hemiplegia and hemiparesis following cerebral infarction affecting right dominant side: Secondary | ICD-10-CM | POA: Diagnosis not present

## 2022-02-15 DIAGNOSIS — Z931 Gastrostomy status: Secondary | ICD-10-CM | POA: Diagnosis not present

## 2022-02-15 DIAGNOSIS — N189 Chronic kidney disease, unspecified: Secondary | ICD-10-CM | POA: Diagnosis present

## 2022-02-15 DIAGNOSIS — N179 Acute kidney failure, unspecified: Secondary | ICD-10-CM | POA: Diagnosis present

## 2022-02-15 DIAGNOSIS — E876 Hypokalemia: Secondary | ICD-10-CM | POA: Diagnosis present

## 2022-02-15 DIAGNOSIS — T83518A Infection and inflammatory reaction due to other urinary catheter, initial encounter: Secondary | ICD-10-CM | POA: Diagnosis present

## 2022-02-15 DIAGNOSIS — Z823 Family history of stroke: Secondary | ICD-10-CM

## 2022-02-15 DIAGNOSIS — Y846 Urinary catheterization as the cause of abnormal reaction of the patient, or of later complication, without mention of misadventure at the time of the procedure: Secondary | ICD-10-CM | POA: Diagnosis present

## 2022-02-15 DIAGNOSIS — D638 Anemia in other chronic diseases classified elsewhere: Secondary | ICD-10-CM | POA: Diagnosis present

## 2022-02-15 DIAGNOSIS — E872 Acidosis, unspecified: Secondary | ICD-10-CM | POA: Diagnosis present

## 2022-02-15 DIAGNOSIS — I69320 Aphasia following cerebral infarction: Secondary | ICD-10-CM

## 2022-02-15 DIAGNOSIS — I629 Nontraumatic intracranial hemorrhage, unspecified: Secondary | ICD-10-CM | POA: Diagnosis not present

## 2022-02-15 DIAGNOSIS — Z978 Presence of other specified devices: Secondary | ICD-10-CM | POA: Diagnosis not present

## 2022-02-15 DIAGNOSIS — I10 Essential (primary) hypertension: Secondary | ICD-10-CM | POA: Diagnosis not present

## 2022-02-15 DIAGNOSIS — J45909 Unspecified asthma, uncomplicated: Secondary | ICD-10-CM | POA: Diagnosis present

## 2022-02-15 DIAGNOSIS — L89154 Pressure ulcer of sacral region, stage 4: Secondary | ICD-10-CM | POA: Diagnosis present

## 2022-02-15 DIAGNOSIS — Z7189 Other specified counseling: Secondary | ICD-10-CM

## 2022-02-15 DIAGNOSIS — I4819 Other persistent atrial fibrillation: Secondary | ICD-10-CM | POA: Diagnosis present

## 2022-02-15 DIAGNOSIS — E1122 Type 2 diabetes mellitus with diabetic chronic kidney disease: Secondary | ICD-10-CM | POA: Diagnosis present

## 2022-02-15 DIAGNOSIS — N3000 Acute cystitis without hematuria: Secondary | ICD-10-CM

## 2022-02-15 DIAGNOSIS — A419 Sepsis, unspecified organism: Principal | ICD-10-CM | POA: Diagnosis present

## 2022-02-15 DIAGNOSIS — I129 Hypertensive chronic kidney disease with stage 1 through stage 4 chronic kidney disease, or unspecified chronic kidney disease: Secondary | ICD-10-CM | POA: Diagnosis present

## 2022-02-15 DIAGNOSIS — I480 Paroxysmal atrial fibrillation: Secondary | ICD-10-CM

## 2022-02-15 DIAGNOSIS — D696 Thrombocytopenia, unspecified: Secondary | ICD-10-CM | POA: Diagnosis present

## 2022-02-15 DIAGNOSIS — Z87891 Personal history of nicotine dependence: Secondary | ICD-10-CM

## 2022-02-15 DIAGNOSIS — Z79899 Other long term (current) drug therapy: Secondary | ICD-10-CM

## 2022-02-15 DIAGNOSIS — Z833 Family history of diabetes mellitus: Secondary | ICD-10-CM

## 2022-02-15 DIAGNOSIS — G40909 Epilepsy, unspecified, not intractable, without status epilepticus: Secondary | ICD-10-CM | POA: Diagnosis present

## 2022-02-15 DIAGNOSIS — K219 Gastro-esophageal reflux disease without esophagitis: Secondary | ICD-10-CM | POA: Diagnosis present

## 2022-02-15 DIAGNOSIS — I9589 Other hypotension: Secondary | ICD-10-CM | POA: Diagnosis not present

## 2022-02-15 DIAGNOSIS — L8990 Pressure ulcer of unspecified site, unspecified stage: Secondary | ICD-10-CM | POA: Insufficient documentation

## 2022-02-15 DIAGNOSIS — Z993 Dependence on wheelchair: Secondary | ICD-10-CM

## 2022-02-15 DIAGNOSIS — Z9884 Bariatric surgery status: Secondary | ICD-10-CM

## 2022-02-15 LAB — COMPREHENSIVE METABOLIC PANEL
ALT: 20 U/L (ref 0–44)
AST: 29 U/L (ref 15–41)
Albumin: 2.6 g/dL — ABNORMAL LOW (ref 3.5–5.0)
Alkaline Phosphatase: 74 U/L (ref 38–126)
Anion gap: 12 (ref 5–15)
BUN: 50 mg/dL — ABNORMAL HIGH (ref 8–23)
CO2: 21 mmol/L — ABNORMAL LOW (ref 22–32)
Calcium: 8.2 mg/dL — ABNORMAL LOW (ref 8.9–10.3)
Chloride: 110 mmol/L (ref 98–111)
Creatinine, Ser: 2.14 mg/dL — ABNORMAL HIGH (ref 0.44–1.00)
GFR, Estimated: 24 mL/min — ABNORMAL LOW (ref >60)
Glucose, Bld: 140 mg/dL — ABNORMAL HIGH (ref 70–99)
Potassium: 3.5 mmol/L (ref 3.5–5.1)
Sodium: 143 mmol/L (ref 135–145)
Total Bilirubin: 0.7 mg/dL (ref 0.3–1.2)
Total Protein: 8.2 g/dL — ABNORMAL HIGH (ref 6.5–8.1)

## 2022-02-15 LAB — URINALYSIS, ROUTINE W REFLEX MICROSCOPIC
Bilirubin Urine: NEGATIVE
Glucose, UA: NEGATIVE mg/dL
Ketones, ur: NEGATIVE mg/dL
Nitrite: NEGATIVE
Protein, ur: 100 mg/dL — AB
RBC / HPF: >50 RBC/hpf — ABNORMAL HIGH (ref 0–5)
Specific Gravity, Urine: 1.015 (ref 1.005–1.030)
WBC, UA: >50 WBC/hpf — ABNORMAL HIGH (ref 0–5)
pH: 6 (ref 5.0–8.0)

## 2022-02-15 LAB — BLOOD GAS, ARTERIAL
Acid-base deficit: 3.9 mmol/L — ABNORMAL HIGH (ref 0.0–2.0)
Bicarbonate: 19.5 mmol/L — ABNORMAL LOW (ref 20.0–28.0)
O2 Saturation: 98.5 %
Patient temperature: 39.1
pCO2 arterial: 33 mmHg (ref 32–48)
pH, Arterial: 7.39 (ref 7.35–7.45)
pO2, Arterial: 110 mmHg — ABNORMAL HIGH (ref 83–108)

## 2022-02-15 LAB — CBC WITH DIFFERENTIAL/PLATELET
Abs Immature Granulocytes: 0.19 10*3/uL — ABNORMAL HIGH (ref 0.00–0.07)
Basophils Absolute: 0.1 10*3/uL (ref 0.0–0.1)
Basophils Relative: 1 %
Eosinophils Absolute: 0 10*3/uL (ref 0.0–0.5)
Eosinophils Relative: 0 %
HCT: 38.7 % (ref 36.0–46.0)
Hemoglobin: 12.1 g/dL (ref 12.0–15.0)
Immature Granulocytes: 1 %
Lymphocytes Relative: 13 %
Lymphs Abs: 2.2 10*3/uL (ref 0.7–4.0)
MCH: 26.1 pg (ref 26.0–34.0)
MCHC: 31.3 g/dL (ref 30.0–36.0)
MCV: 83.4 fL (ref 80.0–100.0)
Monocytes Absolute: 1.5 10*3/uL — ABNORMAL HIGH (ref 0.1–1.0)
Monocytes Relative: 9 %
Neutro Abs: 12.8 10*3/uL — ABNORMAL HIGH (ref 1.7–7.7)
Neutrophils Relative %: 76 %
Platelets: 176 10*3/uL (ref 150–400)
RBC: 4.64 MIL/uL (ref 3.87–5.11)
RDW: 18.9 % — ABNORMAL HIGH (ref 11.5–15.5)
WBC: 16.8 10*3/uL — ABNORMAL HIGH (ref 4.0–10.5)
nRBC: 0 % (ref 0.0–0.2)

## 2022-02-15 LAB — CBC
HCT: 40 % (ref 36.0–46.0)
Hemoglobin: 12 g/dL (ref 12.0–15.0)
MCH: 26.2 pg (ref 26.0–34.0)
MCHC: 30 g/dL (ref 30.0–36.0)
MCV: 87.3 fL (ref 80.0–100.0)
Platelets: 126 10*3/uL — ABNORMAL LOW (ref 150–400)
RBC: 4.58 MIL/uL (ref 3.87–5.11)
RDW: 18.6 % — ABNORMAL HIGH (ref 11.5–15.5)
WBC: 13.1 10*3/uL — ABNORMAL HIGH (ref 4.0–10.5)
nRBC: 0 % (ref 0.0–0.2)

## 2022-02-15 LAB — PROCALCITONIN: Procalcitonin: 1.53 ng/mL

## 2022-02-15 LAB — RESP PANEL BY RT-PCR (FLU A&B, COVID) ARPGX2
Influenza A by PCR: NEGATIVE
Influenza B by PCR: NEGATIVE
SARS Coronavirus 2 by RT PCR: NEGATIVE

## 2022-02-15 LAB — PROTIME-INR
INR: 1.6 — ABNORMAL HIGH (ref 0.8–1.2)
Prothrombin Time: 18.8 seconds — ABNORMAL HIGH (ref 11.4–15.2)

## 2022-02-15 LAB — PHENYTOIN LEVEL, TOTAL: Phenytoin Lvl: 9.8 ug/mL — ABNORMAL LOW (ref 10.0–20.0)

## 2022-02-15 LAB — APTT: aPTT: 34 seconds (ref 24–36)

## 2022-02-15 LAB — LACTIC ACID, PLASMA
Lactic Acid, Venous: 2.9 mmol/L (ref 0.5–1.9)
Lactic Acid, Venous: 6.9 mmol/L (ref 0.5–1.9)

## 2022-02-15 LAB — CREATININE, SERUM
Creatinine, Ser: 1.67 mg/dL — ABNORMAL HIGH (ref 0.44–1.00)
GFR, Estimated: 33 mL/min — ABNORMAL LOW (ref >60)

## 2022-02-15 LAB — MRSA NEXT GEN BY PCR, NASAL: MRSA by PCR Next Gen: NOT DETECTED

## 2022-02-15 LAB — CBG MONITORING, ED: Glucose-Capillary: 132 mg/dL — ABNORMAL HIGH (ref 70–99)

## 2022-02-15 LAB — VALPROIC ACID LEVEL: Valproic Acid Lvl: 23 ug/mL — ABNORMAL LOW (ref 50.0–100.0)

## 2022-02-15 LAB — BRAIN NATRIURETIC PEPTIDE: B Natriuretic Peptide: 442 pg/mL — ABNORMAL HIGH (ref 0.0–100.0)

## 2022-02-15 MED ORDER — SODIUM CHLORIDE 0.9 % IV SOLN
2.0000 g | INTRAVENOUS | Status: DC
  Administered 2022-02-15: 2 g via INTRAVENOUS
  Filled 2022-02-15: qty 20

## 2022-02-15 MED ORDER — VALPROIC ACID 250 MG/5ML PO SOLN
500.0000 mg | Freq: Four times a day (QID) | ORAL | Status: DC
  Administered 2022-02-15 – 2022-02-17 (×6): 500 mg
  Filled 2022-02-15 (×7): qty 10

## 2022-02-15 MED ORDER — LACTATED RINGERS IV BOLUS (SEPSIS)
1000.0000 mL | Freq: Once | INTRAVENOUS | Status: AC
  Administered 2022-02-15: 1000 mL via INTRAVENOUS

## 2022-02-15 MED ORDER — LACTATED RINGERS IV BOLUS: 1000.0000 mL | Freq: Once | INTRAVENOUS | Status: DC

## 2022-02-15 MED ORDER — POLYETHYLENE GLYCOL 3350 17 G PO PACK
34.0000 g | PACK | Freq: Two times a day (BID) | ORAL | Status: DC
  Administered 2022-02-16 – 2022-02-19 (×8): 34 g
  Filled 2022-02-15 (×9): qty 2

## 2022-02-15 MED ORDER — SENNOSIDES-DOCUSATE SODIUM 8.6-50 MG PO TABS
2.0000 | ORAL_TABLET | Freq: Two times a day (BID) | ORAL | Status: DC
  Administered 2022-02-16: 2
  Filled 2022-02-15 (×2): qty 2

## 2022-02-15 MED ORDER — CEFEPIME HCL 2 G IV SOLR
2.0000 g | INTRAVENOUS | Status: AC
Start: 2022-02-15 — End: 2022-02-15
  Administered 2022-02-15: 2 g via INTRAVENOUS
  Filled 2022-02-15: qty 12.5

## 2022-02-15 MED ORDER — AMIODARONE IV BOLUS ONLY 150 MG/100ML
INTRAVENOUS | Status: AC
  Filled 2022-02-15: qty 100

## 2022-02-15 MED ORDER — AMIODARONE HCL IN DEXTROSE 360-4.14 MG/200ML-% IV SOLN
30.0000 mg/h | INTRAVENOUS | Status: DC
  Administered 2022-02-16 – 2022-02-19 (×7): 30 mg/h via INTRAVENOUS
  Filled 2022-02-15 (×6): qty 200

## 2022-02-15 MED ORDER — AMIODARONE HCL IN DEXTROSE 360-4.14 MG/200ML-% IV SOLN
INTRAVENOUS | Status: AC
  Administered 2022-02-15: 60 mg/h via INTRAVENOUS
  Filled 2022-02-15: qty 200

## 2022-02-15 MED ORDER — LACTATED RINGERS IV BOLUS (SEPSIS)
500.0000 mL | Freq: Once | INTRAVENOUS | Status: AC
  Administered 2022-02-15: 500 mL via INTRAVENOUS

## 2022-02-15 MED ORDER — VANCOMYCIN HCL 1500 MG/300ML IV SOLN
1500.0000 mg | INTRAVENOUS | Status: AC
Start: 2022-02-15 — End: 2022-02-15
  Administered 2022-02-15: 1500 mg via INTRAVENOUS
  Filled 2022-02-15: qty 300

## 2022-02-15 MED ORDER — SODIUM CHLORIDE 0.9 % IV SOLN
250.0000 mL | INTRAVENOUS | Status: DC
  Administered 2022-02-21: 250 mL via INTRAVENOUS

## 2022-02-15 MED ORDER — POTASSIUM CHLORIDE 10 MEQ/100ML IV SOLN
10.0000 meq | INTRAVENOUS | Status: AC
  Administered 2022-02-15 (×2): 10 meq via INTRAVENOUS
  Filled 2022-02-15 (×2): qty 100

## 2022-02-15 MED ORDER — VALPROIC ACID 250 MG/5ML PO SOLN
250.0000 mg | Freq: Four times a day (QID) | ORAL | Status: DC
  Filled 2022-02-15: qty 5

## 2022-02-15 MED ORDER — AMIODARONE HCL IN DEXTROSE 360-4.14 MG/200ML-% IV SOLN
60.0000 mg/h | INTRAVENOUS | Status: AC
  Administered 2022-02-16: 60 mg/h via INTRAVENOUS
  Filled 2022-02-15: qty 200

## 2022-02-15 MED ORDER — LEVETIRACETAM IN NACL 1000 MG/100ML IV SOLN
1000.0000 mg | Freq: Two times a day (BID) | INTRAVENOUS | Status: DC
  Administered 2022-02-15: 1000 mg via INTRAVENOUS
  Filled 2022-02-15 (×2): qty 100

## 2022-02-15 MED ORDER — ACETAMINOPHEN 160 MG/5ML PO SOLN
650.0000 mg | Freq: Once | ORAL | Status: AC
  Administered 2022-02-15: 650 mg
  Filled 2022-02-15: qty 20.3

## 2022-02-15 MED ORDER — SENNOSIDES-DOCUSATE SODIUM 8.6-50 MG PO TABS: 2.0000 | ORAL_TABLET | Freq: Two times a day (BID) | ORAL | Status: DC

## 2022-02-15 MED ORDER — LEVOTHYROXINE SODIUM 75 MCG PO TABS
75.0000 ug | ORAL_TABLET | Freq: Every day | ORAL | Status: DC
  Administered 2022-02-18 – 2022-02-22 (×5): 75 ug
  Filled 2022-02-15 (×8): qty 1

## 2022-02-15 MED ORDER — NOREPINEPHRINE 4 MG/250ML-% IV SOLN
2.0000 ug/min | INTRAVENOUS | Status: DC
  Administered 2022-02-15: 5 ug/min via INTRAVENOUS
  Administered 2022-02-16 (×2): 10 ug/min via INTRAVENOUS
  Filled 2022-02-15: qty 250

## 2022-02-15 MED ORDER — NOREPINEPHRINE 4 MG/250ML-% IV SOLN
0.0000 ug/min | INTRAVENOUS | Status: DC
  Administered 2022-02-15: 2 ug/min via INTRAVENOUS
  Filled 2022-02-15: qty 250

## 2022-02-15 MED ORDER — SODIUM CHLORIDE 0.9 % IV SOLN
2.0000 g | INTRAVENOUS | Status: DC
  Administered 2022-02-16: 2 g via INTRAVENOUS
  Filled 2022-02-15: qty 12.5

## 2022-02-15 MED ORDER — AMIODARONE LOAD VIA INFUSION
150.0000 mg | Freq: Once | INTRAVENOUS | Status: AC
  Administered 2022-02-15: 150 mg via INTRAVENOUS
  Filled 2022-02-15: qty 83.34

## 2022-02-15 MED ORDER — VANCOMYCIN HCL 1250 MG/250ML IV SOLN: 1250.0000 mg | INTRAVENOUS | Status: DC

## 2022-02-15 MED ORDER — POLYETHYLENE GLYCOL 3350 17 G PO PACK: 34.0000 g | PACK | Freq: Two times a day (BID) | ORAL | Status: DC

## 2022-02-15 MED ORDER — DULOXETINE HCL 30 MG PO CPEP: 30.0000 mg | ORAL_CAPSULE | Freq: Every day | ORAL | Status: DC

## 2022-02-15 MED ORDER — LEVOTHYROXINE SODIUM 50 MCG PO TABS: 75.0000 ug | ORAL_TABLET | Freq: Every day | ORAL | Status: DC

## 2022-02-15 MED ORDER — ACETAMINOPHEN 160 MG/5ML PO SOLN
650.0000 mg | ORAL | Status: AC | PRN
  Administered 2022-02-15 – 2022-02-17 (×4): 650 mg
  Filled 2022-02-15 (×4): qty 20.3

## 2022-02-15 MED ORDER — CHLORHEXIDINE GLUCONATE CLOTH 2 % EX PADS
6.0000 | MEDICATED_PAD | Freq: Every day | CUTANEOUS | Status: DC
Start: 2022-02-15 — End: 2022-02-22
  Administered 2022-02-15 – 2022-02-22 (×7): 6 via TOPICAL

## 2022-02-15 MED ORDER — HEPARIN SODIUM (PORCINE) 5000 UNIT/ML IJ SOLN
5000.0000 [IU] | Freq: Three times a day (TID) | INTRAMUSCULAR | Status: DC
  Administered 2022-02-15 – 2022-02-16 (×4): 5000 [IU] via SUBCUTANEOUS
  Filled 2022-02-15 (×4): qty 1

## 2022-02-15 MED ORDER — LACTATED RINGERS IV BOLUS
500.0000 mL | Freq: Once | INTRAVENOUS | Status: AC
  Administered 2022-02-15: 500 mL via INTRAVENOUS

## 2022-02-15 MED ORDER — PHENYTOIN 125 MG/5ML PO SUSP
187.5000 mg | Freq: Two times a day (BID) | ORAL | Status: DC
  Administered 2022-02-15 – 2022-02-17 (×4): 187.5 mg
  Filled 2022-02-15 (×4): qty 8

## 2022-02-15 MED ORDER — PANTOPRAZOLE 2 MG/ML SUSPENSION
40.0000 mg | Freq: Every day | ORAL | Status: DC
  Administered 2022-02-15 – 2022-02-22 (×8): 40 mg
  Filled 2022-02-15 (×8): qty 20

## 2022-02-15 MED ORDER — SODIUM CHLORIDE 0.9 % IV SOLN
2000.0000 mg | Freq: Two times a day (BID) | INTRAVENOUS | Status: DC
  Filled 2022-02-15: qty 20

## 2022-02-15 MED ORDER — LACTATED RINGERS IV SOLN: INTRAVENOUS | Status: AC

## 2022-02-15 NOTE — Progress Notes (Incomplete)
MEDICATION RELATED CONSULT NOTE - INITIAL   Pharmacy Consult for Phenytoin Indication: hx seizures  Allergies  Allergen Reactions  . Tape Rash  . Meperidine Hcl Nausea And Vomiting  . Sulfonamide Derivatives Itching  . Bacitracin-Polymyxin B Dermatitis    "Cloth Band-Aid only"  . Oxycodone Itching    Patient Measurements: Height: 5\' 5"  (165.1 cm) Weight: 82.4 kg (181 lb 10.5 oz) IBW/kg (Calculated) : 57 Adjusted Body Weight: 65kg  Vital Signs: Temp: 102.3 F (39.1 C) (07/21 2214) Temp Source: Axillary (07/21 2214) BP: 123/75 (07/21 2050) Pulse Rate: 168 (07/21 2045) Intake/Output from previous day: No intake/output data recorded. Intake/Output from this shift: Total I/O In: 600 [IV Piggyback:600] Out: 1400 [Urine:1400]  Labs: Recent Labs    02/15/22 1715 02/15/22 2120  WBC 16.8* 13.1*  HGB 12.1 12.0  HCT 38.7 40.0  PLT 176 126*  APTT 34  --   CREATININE 2.14* 1.67*  ALBUMIN 2.6*  --   PROT 8.2*  --   AST 29  --   ALT 20  --   ALKPHOS 74  --   BILITOT 0.7  --    Estimated Creatinine Clearance: 32.8 mL/min (A) (by C-G formula based on SCr of 1.67 mg/dL (H)).   Microbiology: Recent Results (from the past 720 hour(s))  Resp Panel by RT-PCR (Flu A&B, Covid) Anterior Nasal Swab     Status: None   Collection Time: 02/15/22  6:12 PM   Specimen: Anterior Nasal Swab  Result Value Ref Range Status   SARS Coronavirus 2 by RT PCR NEGATIVE NEGATIVE Final    Comment: (NOTE) SARS-CoV-2 target nucleic acids are NOT DETECTED.  The SARS-CoV-2 RNA is generally detectable in upper respiratory specimens during the acute phase of infection. The lowest concentration of SARS-CoV-2 viral copies this assay can detect is 138 copies/mL. A negative result does not preclude SARS-Cov-2 infection and should not be used as the sole basis for treatment or other patient management decisions. A negative result may occur with  improper specimen collection/handling, submission of  specimen other than nasopharyngeal swab, presence of viral mutation(s) within the areas targeted by this assay, and inadequate number of viral copies(<138 copies/mL). A negative result must be combined with clinical observations, patient history, and epidemiological information. The expected result is Negative.  Fact Sheet for Patients:  EntrepreneurPulse.com.au  Fact Sheet for Healthcare Providers:  IncredibleEmployment.be  This test is no t yet approved or cleared by the [place] FDA and  has been authorized for detection and/or diagnosis of SARS-CoV-2 by FDA under an Emergency Use Authorization (EUA). This EUA will remain  in effect (meaning this test can be used) for the duration of the COVID-19 declaration under Section 564(b)(1) of the Act, 21 U.S.C.section 360bbb-3(b)(1), unless the authorization is terminated  or revoked sooner.       Influenza A by PCR NEGATIVE NEGATIVE Final   Influenza B by PCR NEGATIVE NEGATIVE Final    Comment: (NOTE) The Xpert Xpress SARS-CoV-2/FLU/RSV plus assay is intended as an aid in the diagnosis of influenza from Nasopharyngeal swab specimens and should not be used as a sole basis for treatment. Nasal washings and aspirates are unacceptable for Xpert Xpress SARS-CoV-2/FLU/RSV testing.  Fact Sheet for Patients: EntrepreneurPulse.com.au  Fact Sheet for Healthcare Providers: IncredibleEmployment.be  This test is not yet approved or cleared by the [place] FDA and has been authorized for detection and/or diagnosis of SARS-CoV-2 by FDA under an Emergency Use Authorization (EUA). This EUA will remain in effect (meaning  this test can be used) for the duration of the COVID-19 declaration under Section 564(b)(1) of the Act, 21 U.S.C. section 360bbb-3(b)(1), unless the authorization is terminated or revoked.  Performed at [facility], [address], [place], [place] [address]     Medical History: Past Medical History:  Diagnosis Date  . Aphasia   . Arthritis   . Asthma   . Cardiac pacemaker   . Cerebral amyloid angiopathy (CODE)   . CKD (chronic kidney disease)   . Cognitive communication deficit   . 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 GERD (gastroesophageal reflux disease)   . 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 Hyperlipemia   . Hypertension   . Insomnia   . Intracerebral hemorrhage, intraventricular (HCC)   . Muscle weakness   . Other abnormalities of gait and mobility   . Seizures (HCC)   . Stroke (HCC)   . Unsteadiness on feet   . Vitamin B deficiency     Medications:  Medications Prior to Admission  Medication Sig Dispense Refill Last Dose  . acetaminophen (TYLENOL) 500 MG tablet Take 1,000 mg by mouth every 8 (eight) hours.   02/15/2022 at 1400  . docusate (COLACE) 50 MG/5ML liquid Take 100 mg by mouth 2 (two) times daily.   02/15/2022 at 0800  . folic acid (FOLVITE) 400 MCG tablet Take 400 mcg by mouth daily.   02/15/2022 at 0800  . levETIRAcetam (KEPPRA) 1000 MG tablet Take 2 tablets (2,000 mg total) by mouth 2 (two) times daily. (Patient taking differently: Place 1,000 mg into feeding tube 2 (two) times daily.)   02/15/2022 at 0800  . levothyroxine (SYNTHROID) 75 MCG tablet Take 75 mcg by mouth daily before breakfast.   02/15/2022 at 0600  . Lidocaine 4 % PTCH Apply 1 patch topically daily.   02/15/2022 at 0800  . methocarbamol (ROBAXIN) 500 MG tablet Take 500 mg by mouth 3 (three) times daily.   02/15/2022 at 1400  . ondansetron (ZOFRAN-ODT) 4 MG disintegrating tablet Take 4 mg by mouth every 8 (eight) hours as needed for nausea or vomiting.   unknown  . pantoprazole (PROTONIX) 40 MG tablet Take 40 mg by mouth daily.   unknown  . phenytoin (DILANTIN) 125 MG/5ML suspension Take 6 mLs (150  mg total) by mouth 2 (two) times daily. (Patient taking differently: Take 187.5 mg by mouth 2 (two) times daily. 7.5 mls BID) 237 mL 12 02/15/2022 at 0800  . Pollen Extracts (PROSTAT PO) Take 30 mLs by mouth in the morning, at noon, and at bedtime.   02/15/2022 at 1400  . polyethylene glycol (MIRALAX / GLYCOLAX) 17 g packet Take 34 g by mouth 2 (two) times daily. (Patient taking differently: Take 34 g by mouth daily.) 14 each 0 02/15/2022 at 0800  . pregabalin (LYRICA) 75 MG capsule Take 75 mg by mouth 3 (three) times daily.   02/15/2022 at 1400  . Probiotic Product (PROBIOTIC PO) 1 capsule by Per J Tube route daily.   unknown  . senna-docusate (SENOKOT-S) 8.6-50 MG tablet Take 2 tablets by mouth 2 (two) times daily.   02/15/2022 at 0800  . Simethicone 80 MG TABS Take 1 tablet by mouth in the morning and at bedtime.   02/15/2022 at 0700  . simvastatin (ZOCOR) 10 MG tablet Take 10 mg by mouth at bedtime.   unknown  . valproic acid (DEPAKENE) 250  MG/5ML solution Take 500 mg by mouth 4 (four) times daily.   02/15/2022 at 1200  . bisacodyl (DULCOLAX) 5 MG EC tablet Take 1 tablet (5 mg total) by mouth in the morning and at bedtime. (Patient not taking: Reported on 02/15/2022) 30 tablet 0 Completed Course  . phenytoin (DILANTIN) 125 MG/5ML suspension Take 4 mLs (100 mg total) by mouth daily. (Patient not taking: Reported on 02/15/2022) 237 mL 12 Not Taking    Assessment: 71 yo F with hx CVA in 2018 and seizures on phenytoin, valproic acid and levetiracetam PTA. She has noted aphasia and receives meds via feeding tube. She resides in facility where meds are administered to her.  She is admitted with urosepsis, acute kidney injury.   No mention of recent seizure activity.  Corrected Pheytoin level:  15.8 (goal 10-20) -->calculated from measured phenytoin level 9.8, albumin 2.8, Scr 1.67.  Dose recently decreased to pheytoin 187.5mg  BID  Valproic Acid level 23 (goal 50-100) -->dose was recently increased to 500mg   4x/day  Goal of Therapy:  Prevention of seizure activity Phenytoin level 10-20  Plan:  ***  PharmD 02/15/2022,11:18 PM

## 2022-02-15 NOTE — Progress Notes (Signed)
A consult was received from an ED physician for Vancomycin and Cefepime per pharmacy dosing.  The patient's profile has been reviewed for ht/wt/allergies/indication/available labs.    A one time order has been placed for Vancomycin 1500mg  IV and Cefepime 2g IV.  Further antibiotics/pharmacy consults should be ordered by admitting physician if indicated.                       Thank you, 02/15/2022  6:54 PM

## 2022-02-15 NOTE — Progress Notes (Signed)
MEDICATION RELATED CONSULT NOTE - INITIAL   Pharmacy Consult for Phenytoin Indication: hx seizures  Allergies  Allergen Reactions   Tape Rash   Meperidine Hcl Nausea And Vomiting   Sulfonamide Derivatives Itching   Bacitracin-Polymyxin B Dermatitis    "Cloth Band-Aid only"   Oxycodone Itching    Patient Measurements: Height: 5\' 5"  (165.1 cm) Weight: 82.4 kg (181 lb 10.5 oz) IBW/kg (Calculated) : 57 Adjusted Body Weight: 65kg  Vital Signs: Temp: 102.3 F (39.1 C) (07/21 2214) Temp Source: Axillary (07/21 2214) BP: 123/75 (07/21 2050) Pulse Rate: 168 (07/21 2045) Intake/Output from previous day: No intake/output data recorded. Intake/Output from this shift: Total I/O In: 600 [IV Piggyback:600] Out: 1400 [Urine:1400]  Labs: Recent Labs    02/15/22 1715 02/15/22 2120  WBC 16.8* 13.1*  HGB 12.1 12.0  HCT 38.7 40.0  PLT 176 126*  APTT 34  --   CREATININE 2.14* 1.67*  ALBUMIN 2.6*  --   PROT 8.2*  --   AST 29  --   ALT 20  --   ALKPHOS 74  --   BILITOT 0.7  --    Estimated Creatinine Clearance: 32.8 mL/min (A) (by C-G formula based on SCr of 1.67 mg/dL (H)).   Microbiology: Recent Results (from the past 720 hour(s))  Resp Panel by RT-PCR (Flu A&B, Covid) Anterior Nasal Swab     Status: None   Collection Time: 02/15/22  6:12 PM   Specimen: Anterior Nasal Swab  Result Value Ref Range Status   SARS Coronavirus 2 by RT PCR NEGATIVE NEGATIVE Final    Comment: (NOTE) SARS-CoV-2 target nucleic acids are NOT DETECTED.  The SARS-CoV-2 RNA is generally detectable in upper respiratory specimens during the acute phase of infection. The lowest concentration of SARS-CoV-2 viral copies this assay can detect is 138 copies/mL. A negative result does not preclude SARS-Cov-2 infection and should not be used as the sole basis for treatment or other patient management decisions. A negative result may occur with  improper specimen collection/handling, submission of specimen  other than nasopharyngeal swab, presence of viral mutation(s) within the areas targeted by this assay, and inadequate number of viral copies(<138 copies/mL). A negative result must be combined with clinical observations, patient history, and epidemiological information. The expected result is Negative.  Fact Sheet for Patients:  02/17/22  Fact Sheet for Healthcare Providers:  BloggerCourse.com  This test is no t yet approved or cleared by the [place] FDA and  has been authorized for detection and/or diagnosis of SARS-CoV-2 by FDA under an Emergency Use Authorization (EUA). This EUA will remain  in effect (meaning this test can be used) for the duration of the COVID-19 declaration under Section 564(b)(1) of the Act, 21 U.S.C.section 360bbb-3(b)(1), unless the authorization is terminated  or revoked sooner.       Influenza A by PCR NEGATIVE NEGATIVE Final   Influenza B by PCR NEGATIVE NEGATIVE Final    Comment: (NOTE) The Xpert Xpress SARS-CoV-2/FLU/RSV plus assay is intended as an aid in the diagnosis of influenza from Nasopharyngeal swab specimens and should not be used as a sole basis for treatment. Nasal washings and aspirates are unacceptable for Xpert Xpress SARS-CoV-2/FLU/RSV testing.  Fact Sheet for Patients: [place]  Fact Sheet for Healthcare Providers: BloggerCourse.com  This test is not yet approved or cleared by the [place] FDA and has been authorized for detection and/or diagnosis of SARS-CoV-2 by FDA under an Emergency Use Authorization (EUA). This EUA will remain in effect (meaning  this test can be used) for the duration of the COVID-19 declaration under Section 564(b)(1) of the Act, 21 U.S.C. section 360bbb-3(b)(1), unless the authorization is terminated or revoked.  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   02/15/2022 at 1400   docusate (COLACE) 50 MG/5ML liquid Take 100 mg by mouth 2 (two) times daily.   [id number] at [id number]   folic acid (FOLVITE) [id number] MCG tablet Take 400 mcg by mouth daily.   02/15/2022 at 0800   levETIRAcetam (KEPPRA) 1000 MG tablet Take 2 tablets (2,000 mg total) by mouth 2 (two) times daily. (Patient taking differently: Place 1,000 mg into feeding tube 2 (two) times daily.)   02/15/2022 at 0800   levothyroxine (SYNTHROID) 75 MCG tablet Take 75 mcg by mouth daily before breakfast.   02/15/2022 at 0600   Lidocaine 4 % PTCH Apply 1 patch topically daily.   02/15/2022 at 0800   methocarbamol (ROBAXIN) 500 MG tablet Take 500 mg by mouth 3 (three) times daily.   02/15/2022 at 1400   ondansetron (ZOFRAN-ODT) 4 MG disintegrating tablet Take 4 mg by mouth every 8 (eight) hours as needed for nausea or vomiting.   unknown   pantoprazole (PROTONIX) 40 MG tablet Take 40 mg by mouth daily.   unknown   phenytoin (DILANTIN) 125 MG/5ML suspension Take 6 mLs (150 mg total) by mouth 2 (two) times  daily. (Patient taking differently: Take 187.5 mg by mouth 2 (two) times daily. 7.5 mls BID) 237 mL 12 02/15/2022 at 0800   Pollen Extracts (PROSTAT PO) Take 30 mLs by mouth in the morning, at noon, and at bedtime.   02/15/2022 at 1400   polyethylene glycol (MIRALAX / GLYCOLAX) 17 g packet Take 34 g by mouth 2 (two) times daily. (Patient taking differently: Take 34 g by mouth daily.) 14 each 0 02/15/2022 at 0800   pregabalin (LYRICA) 75 MG capsule Take 75 mg by mouth 3 (three) times daily.   02/15/2022 at 1400   Probiotic Product (PROBIOTIC PO) 1 capsule by Per J Tube route daily.   unknown   senna-docusate (SENOKOT-S) 8.6-50 MG tablet Take 2 tablets by mouth 2 (two) times daily.   02/15/2022 at 0800   Simethicone 80 MG TABS Take 1 tablet by mouth in the morning and at bedtime.   02/15/2022 at 0700   simvastatin (ZOCOR) 10 MG tablet Take 10 mg by mouth at bedtime.   unknown   valproic acid (DEPAKENE) 250  MG/5ML solution Take 500 mg by mouth 4 (four) times daily.   02/15/2022 at 1200   bisacodyl (DULCOLAX) 5 MG EC tablet Take 1 tablet (5 mg total) by mouth in the morning and at bedtime. (Patient not taking: Reported on 02/15/2022) 30 tablet 0 Completed Course   phenytoin (DILANTIN) 125 MG/5ML suspension Take 4 mLs (100 mg total) by mouth daily. (Patient not taking: Reported on 02/15/2022) 237 mL 12 Not Taking    Assessment: 71 yo F with hx CVA in 2018 and seizures on phenytoin, valproic acid and levetiracetam PTA. She has noted aphasia and receives meds via feeding tube. She resides in facility where meds are administered to her. She is followed by neurology at [facility]. She is admitted with urosepsis, acute kidney injury.  Scr is trending down since admission 2.14>>1.67 (baseline <1) No mention of recent seizure activity.  Corrected Pheytoin level:  15.8 (goal 10-20) -->calculated from measured phenytoin level 9.8, albumin 2.8, Scr 1.67.  Dose recently decreased to pheytoin 187.5mg  BID  Valproic  Acid level 23 (goal 50-100) -->dose was recently increased to 500mg  4x/day Levetiracetam recenty decreased to 1000mg  BID.   Goal of Therapy:  Prevention of seizure activity Phenytoin level 10-20  Plan:  Continue home regimen as above.   Monitor renal function   PharmD 02/15/2022,11:18 PM

## 2022-02-15 NOTE — Progress Notes (Signed)
An USGPIV (ultrasound guided PIV) has been placed for short-term vasopressor infusion. A correctly placed ivWatch must be used when administering Vasopressors. Should this treatment be needed beyond 72 hours, central line access should be obtained.  It will be the responsibility of the bedside nurse to follow best practice to prevent extravasations.   ?

## 2022-02-15 NOTE — H&P (Signed)
NAME:  Kathleen Jensen, MRN:  [id number], DOB:  28-Oct-1950, LOS: 0 ADMISSION DATE:  02/15/2022, CONSULTATION DATE:  7/21 REFERRING MD:  Lockie Mola, CHIEF COMPLAINT: Altered mental status  History of Present Illness:   Kathleen Jensen, is a 71 y.o. female, who presented to the [facility] ED with a chief complaint of altered mental status.  They have a pertinent past medical history of chronic sacral wound, stroke, diabetes, seizures, CKD unspecified, GERD, hypertension, hyperlipidemia  Patient is a resident of skilled nursing facility.  Presented to the ED after developing altered mental status.  Patient became hypotensive with EMS.  ED course was notable for ongoing hypotension with a pressure of 79/51.  WBC 16.8, Tmax 100.2, lactate 2.9, UA concerning for UTI, AKI.  A code sepsis was called.  Blood cultures and urine cultures were obtained.  She was given a dose of vancomycin and cefepime. EDP ordered 30 cc/kg of IVF.  Ongoing hypotension with IV fluids, she was started on peripheral pressors.  PCCM was consulted for admission.   Pertinent  Medical History  chronic sacral wound, stroke, diabetes, seizures, CKD unspecified, GERD, hypertension, hyperlipidemia  Significant Hospital Events: Including procedures, antibiotic start and stop dates in addition to other pertinent events   7/21 Presented to ED, PCCM consult, VANC/CEFE BC>, UC  Interim History / Subjective:  See above  Unable to obtain subjective evaluation due to patient status  Objective   Blood pressure 97/63, pulse 99, temperature 100.2 F (37.9 C), temperature source Rectal, resp. rate 17, height 5\' 5"  (1.651 m), weight 78 kg, SpO2 97 %.        Intake/Output Summary (Last 24 hours) at 02/15/2022 1858 Last data filed at 02/15/2022 1642 Gross per 24 hour  Intake 500 ml  Output --  Net 500 ml   Filed Weights   02/15/22 1643  Weight: 78 kg    Examination: General: Elderly woman, laying in bed in no  distress. HENT: Oropharynx very dry.  Pupils equal.  No stridor Lungs: Decreased to both bases, clear bilaterally Cardiovascular: Tachycardic, somewhat irregular.  Sinus tach with PACs on monitor Abdomen: Nondistended, tender inferiorly.  PEG tube in place with some bilious drainage but no purulence, no evidence of infection Extremities: No edema Neuro: Patient wakes to voice, tracks.  Communicates but is somewhat confused.  She will reorient.  Follows commands GU: Foley catheter in place with thick purulent urine Skin: Sacral wound without any purulence or evidence of infection  Resolved Hospital Problem list     Assessment & Plan:  Septic shock, suspect secondary to urosepsis Lactic acidosis, secondary to above UA concerning for UTI.  WBC 16.8.  Tmax 100.2.  Lactate 2.9.  Resident of skilled nursing facility.  Chest x-ray without pneumothorax, effusion, infiltrate.  EKG: Sinus rhythm, no ST changes noted.  At least 2 L of IV fluid ordered by EDP.  I & O's unclear. -Goal MAP 65 or greater. Peripheral levophed ordered. Titrate medication to goal -Continue fluid resuscitation.  -Follow up BC and UC -On Cefepime and Vancomycin. Narrow as cultures result -Trend lactate -Obtain procalcitonin, MRSA PCR -Consider ECHO if she fails to respond quickly to resuscitation  Acute kidney injury Creat 2.14 (baseline 0.6-0.8) -Ensure renal perfusion. Goal MAP 65 or greater. -Avoid neprotoxic drugs as possible. -Strict I&O's -Follow up AM creatinine -Continue foley -Consider renal ultrasound  Acute metabolic encephalopathy, suspect secondary to sepsis, shock -Obtain head CT now -supportive care as above  HX HTN HX HLD Currently hypotensive -Hold  home antihypertensives. -Resume statin once stabilized  GERD -PPI  HX CVA 2018 HX Seizure History of right hemiplegia and expressive aphasia.  On Dilantin, valproic acid and Keppra at home. -Check Dilantin level.  Resume Dilantin per  pharmacy -Resume Keppra -Resume valproic acid -Patient not on antiplatelet agents per home medications.  Hypothyroidism -continue synthroid  DM2 -Blood Glucose goal 140-180. -SSI  Chronic sacral wound -Wound care consult in a.m.  Best Practice (right click and "Reselect all SmartList Selections" daily)   Diet/type: NPO DVT prophylaxis: prophylactic heparin  GI prophylaxis: PPI Lines: N/A Foley:  Yes, and it is still needed Code Status:  full code Last date of multidisciplinary goals of care discussion [reviewed patient's status with her daughter Malachy Mood at bedside 7/21.  Patient's oldest daughter Severiano Gilbert is her healthcare spokesperson.  There is a MOST form signed by Severiano Gilbert from March 2023 that indicates that the patient is full code.  Malachy Mood does indicate that there have been family discussions about aggressiveness of patient's care and that this may need to be revisited.  The patient is full code for now.]  Labs   CBC: Recent Labs  Lab 02/15/22 1715  WBC 16.8*  NEUTROABS 12.8*  HGB 12.1  HCT 38.7  MCV 83.4  PLT 0000000    Basic Metabolic Panel: Recent Labs  Lab 02/15/22 1715  NA 143  K 3.5  CL 110  CO2 21*  GLUCOSE 140*  BUN 50*  CREATININE 2.14*  CALCIUM 8.2*   GFR: Estimated Creatinine Clearance: 24.9 mL/min (A) (by C-G formula based on SCr of 2.14 mg/dL (H)). Recent Labs  Lab 02/15/22 1715  WBC 16.8*  LATICACIDVEN 2.9*    Liver Function Tests: Recent Labs  Lab 02/15/22 1715  AST 29  ALT 20  ALKPHOS 74  BILITOT 0.7  PROT 8.2*  ALBUMIN 2.6*   No results for input(s): "LIPASE", "AMYLASE" in the last 168 hours. No results for input(s): "AMMONIA" in the last 168 hours.  ABG No results found for: "PHART", "PCO2ART", "PO2ART", "HCO3", "TCO2", "ACIDBASEDEF", "O2SAT"   Coagulation Profile: Recent Labs  Lab 02/15/22 1715  INR 1.6*    Cardiac Enzymes: No results for input(s): "CKTOTAL", "CKMB", "CKMBINDEX", "TROPONINI" in the last 168  hours.  HbA1C: Hgb A1c MFr Bld  Date/Time Value Ref Range Status  05/30/2021 02:52 AM 5.5 4.8 - 5.6 % Final    Comment:    (NOTE) Pre diabetes:          5.7%-6.4%  Diabetes:              >6.4%  Glycemic control for   <7.0% adults with diabetes   07/30/2018 12:30 PM 5.8 4.6 - 6.5 % Final    Comment:    Glycemic Control Guidelines for People with Diabetes:Non Diabetic:  <6%Goal of Therapy: <7%Additional Action Suggested:  >8%     CBG: Recent Labs  Lab 02/15/22 1644  GLUCAP 132*    Review of Systems:   As per HPI  Past Medical History:  She,  has a past medical history of Aphasia, Arthritis, Asthma, Cardiac pacemaker, Cerebral amyloid angiopathy (CODE), CKD (chronic kidney disease), Cognitive communication deficit, Diabetes mellitus without complication (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   Surgical History:   Past Surgical History:  Procedure Laterality Date  BACK SURGERY     Dec 18, 2016 Dr.Torrealba   BREAST BIOPSY Right    BREAST EXCISIONAL BIOPSY     CERVICAL SPINE SURGERY     C4   CESAREAN SECTION     GASTRIC BYPASS  03/2007   TONSILLECTOMY       Social History:   reports that she quit smoking about 42 years ago. She has never used smokeless tobacco. She reports that she does not drink alcohol and does not use drugs.   Family History:  Her family history includes Cancer in her brother and sister; Diabetes in her father and mother; High blood pressure in her mother; Stroke in her father.   Allergies Allergies  Allergen Reactions   Tape Rash   Meperidine Hcl Nausea And Vomiting   Sulfonamide Derivatives Itching    Bacitracin-Polymyxin B Dermatitis    "Cloth Band-Aid only"   Oxycodone Itching     Home Medications  Prior to Admission medications   Medication Sig Start Date End Date Taking? Authorizing Provider  acetaminophen (TYLENOL) 500 MG tablet Take 1,000 mg by mouth every 8 (eight) hours.    [provider]  B Complex-C-Folic Acid TABS Take 1 tablet by mouth daily.    [provider]  Baclofen 5 MG TABS Take 5 mg by mouth in the morning and at bedtime.    [provider]  bisacodyl (DULCOLAX) 5 MG EC tablet Take 1 tablet (5 mg total) by mouth in the morning and at bedtime. 06/06/21   Terrilee Croak, MD  CALCIUM CITRATE PO Take 200 mg by mouth in the morning and at bedtime.    [provider]  DULoxetine (CYMBALTA) 30 MG capsule Take 30 mg by mouth daily. 05/24/21   [provider]  Lactobacillus (PROBIOTIC ACIDOPHILUS) CAPS Take 1 capsule by mouth daily.    [provider]  levETIRAcetam (KEPPRA) 1000 MG tablet Take 2 tablets (2,000 mg total) by mouth 2 (two) times daily. 04/14/20   Rai, Vernelle Emerald, MD  levothyroxine (SYNTHROID) 75 MCG tablet Take 75 mcg by mouth daily before breakfast.    [provider]  Lidocaine 4 % PTCH Apply 1 patch topically daily.    [provider]  losartan-hydrochlorothiazide (HYZAAR) 50-12.5 MG tablet Take 1 tablet by mouth daily.    [provider]  ondansetron (ZOFRAN-ODT) 4 MG disintegrating tablet Take 4 mg by mouth in the morning, at noon, and at bedtime. 05/24/21   [provider]  phenytoin (DILANTIN) 125 MG/5ML suspension Take 6 mLs (150 mg total) by mouth 2 (two) times daily. 06/06/21   Terrilee Croak, MD  phenytoin (DILANTIN) 125 MG/5ML suspension Take 4 mLs (100 mg total) by mouth daily. 06/06/21   Dahal, Marlowe Aschoff, MD  polyethylene glycol (MIRALAX / GLYCOLAX) 17 g packet Take 34 g by mouth 2 (two) times daily. 06/06/21   Terrilee Croak, MD  pregabalin (LYRICA) 50 MG capsule Take 50  mg by mouth at bedtime. 04/03/21   [provider]  senna-docusate (SENOKOT-S) 8.6-50 MG tablet Take 2 tablets by mouth 2 (two) times daily.    [provider]  simvastatin (ZOCOR) 10 MG tablet Take 10 mg by mouth daily.    [provider]  valproic acid (DEPAKENE) 250 MG/5ML solution Take 500 mg by mouth 4 (four) times daily.    [provider]     Critical care time: 50 minutes     Baltazar Apo, MD, PhD 02/15/2022, 7:52 PM [facility] [phone] or if no answer before 7:00PM call [phone] For any issues after 7:00PM please call eLink [phone]

## 2022-02-15 NOTE — Progress Notes (Signed)
Pharmacy Antibiotic Note  Kathleen Jensen is a 71 y.o. female admitted on 02/15/2022 with sepsis, AKI. Pharmacy has been consulted for Vancomycin and Cefepime dosing.  Plan: Vancomycin 1500mg  IV x 1 given in the ED. Continue with Vancomycin 1250mg  IV q48h. Vancomycin levels at steady state, as indicated. Cefepime 2g IV q24h. Monitor renal function, cultures, clinical course, and for de-escalation as appropriate.   Height: 5\' 5"  (165.1 cm) Weight: 78 kg (171 lb 15.3 oz) IBW/kg (Calculated) : 57  Temp (24hrs), Avg:100.2 F (37.9 C), Min:100.2 F (37.9 C), Max:100.2 F (37.9 C)  Recent Labs  Lab 02/15/22 1715  WBC 16.8*  CREATININE 2.14*  LATICACIDVEN 2.9*    Estimated Creatinine Clearance: 24.9 mL/min (A) (by C-G formula based on SCr of 2.14 mg/dL (H)).    Allergies  Allergen Reactions   Tape Rash   Meperidine Hcl Nausea And Vomiting   Sulfonamide Derivatives Itching   Bacitracin-Polymyxin B Dermatitis    "Cloth Band-Aid only"   Oxycodone Itching    Antimicrobials this admission: 7/21 Ceftriaxone x 1 7/21 Vancomycin >> 7/21 Cefepime >>  Dose adjustments this admission: --  Microbiology results: 7/21 BCx:  7/21 UCx:   7/21 Respiratory panel: COVID negative, Influenza A/B negative   Thank you for allowing pharmacy to be a part of this patient's care.   8/21, PharmD, BCPS Clinical Pharmacist  02/15/2022 8:00 PM

## 2022-02-15 NOTE — ED Notes (Signed)
Rapid increase in BP and HR noted.  Levophed IV titrated at this time to maintain BP at goal

## 2022-02-15 NOTE — ED Notes (Signed)
Patient placed on 2 L [place].  SpO2 increased to 96% from 87%

## 2022-02-15 NOTE — ED Provider Notes (Addendum)
[facility]-EMERGENCY DEPT Provider Note   CSN: [id number] Arrival date & time: 02/15/22  1625     History  Chief Complaint  Patient presents with   Hypotension   Wound Check    Kathleen Jensen is a 71 y.o. female.  Level 5 caveat due to altered mental status.  Patient arrives in [facility] for altered mental status and concern for infection.  She has chronic sacral wound, history of stroke, diabetes, seizures.  Patient can tell me her name but cannot provide much other history.  She has a MOST form that says she has full scope of treatment and full code.  The history is provided by the patient.       Home Medications Prior to Admission medications   Medication Sig Start Date End Date Taking? Authorizing Provider  acetaminophen (TYLENOL) 500 MG tablet Take 1,000 mg by mouth every 8 (eight) hours.   Yes [provider]  docusate (COLACE) 50 MG/5ML liquid Take 100 mg by mouth 2 (two) times daily.   Yes [provider]  folic acid (FOLVITE) 400 MCG tablet Take 400 mcg by mouth daily.   Yes [provider]  levETIRAcetam (KEPPRA) 1000 MG tablet Take 2 tablets (2,000 mg total) by mouth 2 (two) times daily. Patient taking differently: Place 1,000 mg into feeding tube 2 (two) times daily. 04/14/20  Yes Rai, Ripudeep K, MD  levothyroxine (SYNTHROID) 75 MCG tablet Take 75 mcg by mouth daily before breakfast.   Yes [provider]  Lidocaine 4 % PTCH Apply 1 patch topically daily.   Yes [provider]  methocarbamol (ROBAXIN) 500 MG tablet Take 500 mg by mouth 3 (three) times daily. 02/05/22  Yes [provider]  ondansetron (ZOFRAN-ODT) 4 MG disintegrating tablet Take 4 mg by mouth every 8 (eight) hours as needed for nausea or vomiting. 05/24/21  Yes [provider]  pantoprazole (PROTONIX) 40 MG tablet Take 40 mg by mouth daily. 02/12/22  Yes [provider]  phenytoin  (DILANTIN) 125 MG/5ML suspension Take 6 mLs (150 mg total) by mouth 2 (two) times daily. Patient taking differently: Take 187.5 mg by mouth 2 (two) times daily. 7.5 mls BID 06/06/21  Yes Dahal, Melina Schools, MD  Pollen Extracts (PROSTAT PO) Take 30 mLs by mouth in the morning, at noon, and at bedtime.   Yes [provider]  polyethylene glycol (MIRALAX / GLYCOLAX) 17 g packet Take 34 g by mouth 2 (two) times daily. Patient taking differently: Take 34 g by mouth daily. 06/06/21  Yes Dahal, Melina Schools, MD  pregabalin (LYRICA) 75 MG capsule Take 75 mg by mouth 3 (three) times daily.   Yes [provider]  Probiotic Product (PROBIOTIC PO) 1 capsule by Per J Tube route daily.   Yes [provider]  senna-docusate (SENOKOT-S) 8.6-50 MG tablet Take 2 tablets by mouth 2 (two) times daily.   Yes [provider]  Simethicone 80 MG TABS Take 1 tablet by mouth in the morning and at bedtime.   Yes [provider]  simvastatin (ZOCOR) 10 MG tablet Take 10 mg by mouth at bedtime.   Yes [provider]  valproic acid (DEPAKENE) 250 MG/5ML solution Take 500 mg by mouth 4 (four) times daily.   Yes [provider]  bisacodyl (DULCOLAX) 5 MG EC tablet Take 1 tablet (5 mg total) by mouth in the morning and at bedtime. Patient not taking: Reported on 02/15/2022 06/06/21   Lorin Glass,  MD  phenytoin (DILANTIN) 125 MG/5ML suspension Take 4 mLs (100 mg total) by mouth daily. Patient not taking: Reported on 02/15/2022 06/06/21   Terrilee Croak, MD      Allergies    Tape, Meperidine hcl, Sulfonamide derivatives, Bacitracin-polymyxin b, and Oxycodone    Review of Systems   Review of Systems  Physical Exam Updated Vital Signs BP 123/75   Pulse (!) 168   Temp (!) 102.3 F (39.1 C) (Axillary)   Resp (!) 30   Ht 5\' 5"  (1.651 m)   Wt 82.4 kg   SpO2 (!) 87%   BMI 30.23 kg/m  Physical Exam Vitals and nursing note reviewed.  Constitutional:      General: She is in  acute distress.     Appearance: She is well-developed.  HENT:     Head: Normocephalic and atraumatic.     Nose: Nose normal.     Mouth/Throat:     Mouth: Mucous membranes are moist.  Eyes:     Extraocular Movements: Extraocular movements intact.     Conjunctiva/sclera: Conjunctivae normal.     Pupils: Pupils are equal, round, and reactive to light.  Cardiovascular:     Rate and Rhythm: Regular rhythm. Tachycardia present.     Pulses: Normal pulses.     Heart sounds: Normal heart sounds. No murmur heard. Pulmonary:     Effort: Pulmonary effort is normal. No respiratory distress.     Breath sounds: Normal breath sounds.  Abdominal:     Palpations: Abdomen is soft.     Tenderness: There is no abdominal tenderness.  Musculoskeletal:        General: No swelling.     Cervical back: Neck supple.  Skin:    General: Skin is warm and dry.     Capillary Refill: Capillary refill takes less than 2 seconds.     Comments: Sacral wound shows clean margins, no purulent drainage  Neurological:     General: No focal deficit present.     Mental Status: She is alert.  Psychiatric:        Mood and Affect: Mood normal.     ED Results / Procedures / Treatments   Labs (all labs ordered are listed, but only abnormal results are displayed) Labs Reviewed  LACTIC ACID, PLASMA - Abnormal; Notable for the following components:      Result Value   Lactic Acid, Venous 2.9 (*)    All other components within normal limits  COMPREHENSIVE METABOLIC PANEL - Abnormal; Notable for the following components:   CO2 21 (*)    Glucose, Bld 140 (*)    BUN 50 (*)    Creatinine, Ser 2.14 (*)    Calcium 8.2 (*)    Total Protein 8.2 (*)    Albumin 2.6 (*)    GFR, Estimated 24 (*)    All other components within normal limits  CBC WITH DIFFERENTIAL/PLATELET - Abnormal; Notable for the following components:   WBC 16.8 (*)    RDW 18.9 (*)    Neutro Abs 12.8 (*)    Monocytes Absolute 1.5 (*)    Abs Immature  Granulocytes 0.19 (*)    All other components within normal limits  PROTIME-INR - Abnormal; Notable for the following components:   Prothrombin Time 18.8 (*)    INR 1.6 (*)    All other components within normal limits  URINALYSIS, ROUTINE W REFLEX MICROSCOPIC - Abnormal; Notable for the following components:   APPearance TURBID (*)    Hgb  urine dipstick MODERATE (*)    Protein, ur 100 (*)    Leukocytes,Ua LARGE (*)    RBC / HPF >50 (*)    WBC, UA >50 (*)    Bacteria, UA MANY (*)    All other components within normal limits  CBC - Abnormal; Notable for the following components:   WBC 13.1 (*)    RDW 18.6 (*)    Platelets 126 (*)    All other components within normal limits  CREATININE, SERUM - Abnormal; Notable for the following components:   Creatinine, Ser 1.67 (*)    GFR, Estimated 33 (*)    All other components within normal limits  PHENYTOIN LEVEL, TOTAL - Abnormal; Notable for the following components:   Phenytoin Lvl 9.8 (*)    All other components within normal limits  LACTIC ACID, PLASMA - Abnormal; Notable for the following components:   Lactic Acid, Venous 6.9 (*)    All other components within normal limits  VALPROIC ACID LEVEL - Abnormal; Notable for the following components:   Valproic Acid Lvl 23 (*)    All other components within normal limits  BLOOD GAS, ARTERIAL - Abnormal; Notable for the following components:   pO2, Arterial 110 (*)    Bicarbonate 19.5 (*)    Acid-base deficit 3.9 (*)    All other components within normal limits  BRAIN NATRIURETIC PEPTIDE - Abnormal; Notable for the following components:   B Natriuretic Peptide 442.0 (*)    All other components within normal limits  CBG MONITORING, ED - Abnormal; Notable for the following components:   Glucose-Capillary 132 (*)    All other components within normal limits  RESP PANEL BY RT-PCR (FLU A&B, COVID) ARPGX2  MRSA NEXT GEN BY PCR, NASAL  CULTURE, BLOOD (ROUTINE X 2)  CULTURE, BLOOD (ROUTINE X  2)  URINE CULTURE  APTT  PROCALCITONIN  PROCALCITONIN  CBC  BASIC METABOLIC PANEL  MAGNESIUM  PHOSPHORUS  LACTIC ACID, PLASMA    EKG EKG Interpretation  Date/Time:  Friday February 15 2022 16:45:13 EDT Ventricular Rate:  92 PR Interval:  170 QRS Duration: 77 QT Interval:  496 QTC Calculation: 607 R Axis:   -16 Text Interpretation: Sinus rhythm Ventricular premature complex Confirmed by Lennice Sites ([id number]) on 02/15/2022 5:34:34 PM  Radiology CT HEAD WO CONTRAST (5MM)  Result Date: 02/15/2022 CLINICAL DATA:  Sepsis altered EXAM: CT HEAD WITHOUT CONTRAST TECHNIQUE: Contiguous axial images were obtained from the base of the skull through the vertex without intravenous contrast. RADIATION DOSE REDUCTION: This exam was performed according to the departmental dose-optimization program which includes automated exposure control, adjustment of the mA and/or kV according to patient size and/or use of iterative reconstruction technique. COMPARISON:  CT brain 05/28/2021 FINDINGS: Brain: No acute territorial infarction, hemorrhage or intracranial mass. Atrophy and chronic small vessel ischemic changes of the white matter. Chronic left parietal infarct. Chronic right thalamic and left cerebellar infarcts. Stable ventricle size. Vascular: No hyperdense vessels.  Carotid vascular calcification Skull: Normal. Negative for fracture or focal lesion. Sinuses/Orbits: No acute finding. Other: None IMPRESSION: 1. No CT evidence for acute intracranial abnormality. 2. Atrophy and chronic small vessel ischemic changes of the white matter. Multiple chronic infarcts Electronically Signed   By: Donavan Foil M.D.   On: 02/15/2022 20:28   DG Chest Port 1 View  Result Date: 02/15/2022 CLINICAL DATA:  Questionable sepsis EXAM: PORTABLE CHEST 1 VIEW COMPARISON:  05/29/2021 FINDINGS: Heart size is normal. Left chest multi lead pacer. Unchanged  elevation of the right hemidiaphragm. Both lungs are clear. The visualized  skeletal structures are unremarkable. IMPRESSION: No acute abnormality of the lungs in AP portable projection. Electronically Signed   By: Delanna Ahmadi M.D.   On: 02/15/2022 18:16    Procedures .Critical Care  Performed by: Lennice Sites, DO Authorized by: Lennice Sites, DO   Critical care provider statement:    Critical care time (minutes):  35   Critical care was necessary to treat or prevent imminent or life-threatening deterioration of the following conditions:  Sepsis   Critical care was time spent personally by me on the following activities:  Development of treatment plan with patient or surrogate, blood draw for specimens, discussions with primary provider, evaluation of patient's response to treatment, examination of patient, obtaining history from patient or surrogate, ordering and performing treatments and interventions, ordering and review of laboratory studies, ordering and review of radiographic studies, pulse oximetry, re-evaluation of patient's condition and review of old charts   Care discussed with: admitting provider       Medications Ordered in ED Medications  lactated ringers infusion ( Intravenous Stopped 02/15/22 2241)  0.9 %  sodium chloride infusion (0 mLs Intravenous Hold 02/15/22 1927)  norepinephrine (LEVOPHED) 4mg  in 255mL (0.016 mg/mL) premix infusion (0 mcg/min Intravenous Stopped 02/15/22 2215)  heparin injection 5,000 Units (5,000 Units Subcutaneous Given 02/15/22 2332)  pantoprazole sodium (PROTONIX) 40 mg/20 mL oral suspension 40 mg (40 mg Per Tube Given 02/15/22 2332)  ceFEPIme (MAXIPIME) 2 g in sodium chloride 0.9 % 100 mL IVPB (has no administration in time range)  vancomycin (VANCOREADY) IVPB 1250 mg/250 mL (has no administration in time range)  lactated ringers bolus 1,000 mL (0 mLs Intravenous Hold 02/15/22 2314)  Chlorhexidine Gluconate Cloth 2 % PADS 6 each (6 each Topical Given 02/15/22 2140)  levETIRAcetam (KEPPRA) IVPB 1000 mg/100 mL premix (0 mg  Intravenous Stopped 02/15/22 2322)  amiodarone (NEXTERONE) 1.8 mg/mL load via infusion 150 mg (150 mg Intravenous Bolus from Bag 02/15/22 2228)    Followed by  amiodarone (NEXTERONE PREMIX) 360-4.14 MG/200ML-% (1.8 mg/mL) IV infusion (60 mg/hr Intravenous Infusion Verify 02/15/22 2326)    Followed by  amiodarone (NEXTERONE PREMIX) 360-4.14 MG/200ML-% (1.8 mg/mL) IV infusion (has no administration in time range)  potassium chloride 10 mEq in 100 mL IVPB (0 mEq Intravenous Stopped 02/15/22 2341)  amiodarone (NEXTERONE) 150-4.21 MG/100ML-% bolus (  Canceled Entry 02/15/22 2232)  phenytoin (DILANTIN) 125 MG/5ML suspension 187.5 mg (187.5 mg Per Tube Given 02/15/22 2334)  valproic acid (DEPAKENE) 250 MG/5ML solution 500 mg (500 mg Per Tube Given 02/15/22 2334)  acetaminophen (TYLENOL) 160 MG/5ML solution 650 mg (650 mg Per Tube Given 02/15/22 2334)  levothyroxine (SYNTHROID) tablet 75 mcg (has no administration in time range)  polyethylene glycol (MIRALAX / GLYCOLAX) packet 34 g (has no administration in time range)  senna-docusate (Senokot-S) tablet 2 tablet (has no administration in time range)  lactated ringers bolus 1,000 mL (0 mLs Intravenous Stopped 02/15/22 1752)    And  lactated ringers bolus 1,000 mL (0 mLs Intravenous Stopped 02/15/22 1753)    And  lactated ringers bolus 500 mL (0 mLs Intravenous Stopped 02/15/22 1833)  acetaminophen (TYLENOL) 160 MG/5ML solution 650 mg (650 mg Per Tube Given 02/15/22 1723)  lactated ringers bolus 500 mL (0 mLs Intravenous Stopped 02/15/22 1859)  lactated ringers bolus 500 mL (0 mLs Intravenous Stopped 02/15/22 1915)  ceFEPIme (MAXIPIME) 2 g in sodium chloride 0.9 % 100 mL IVPB (0 g Intravenous Stopped  02/15/22 1942)  vancomycin (VANCOREADY) IVPB 1500 mg/300 mL (0 mg Intravenous Stopped 02/15/22 2141)    ED Course/ Medical Decision Making/ A&P                           Medical Decision Making Amount and/or Complexity of Data Reviewed Labs: ordered. Radiology:  ordered. ECG/medicine tests: ordered.  Risk OTC drugs. Prescription drug management. Decision regarding hospitalization.   Kathleen Jensen is here with altered mental status.  Found to be hypotensive, febrile.  Code sepsis initiated.  Suspect urinary source as on exam urine in her depends that is foul-smelling, thick.  We will place a Foley catheter.  Sacral wound appears to be without infection.  She has a history of stroke, PEG tube, diabetes.  MOST form has full scope of treatment.  Unable to get in touch with family.  History is limited due to altered mental status per EMS per facility.  We will treat with IV Rocephin and check CBC, lactic acid, blood cultures, chest x-ray, COVID testing.  Will give 30 cc/kg IV fluids given hypotension.  Will give Tylenol.  Per review and interpretation of labs patient with a white count of 16.  Urinalysis consistent with infection.  EKG per my review interpretation shows sinus rhythm.  No ischemic changes.  Overall suspect sepsis from urinary source.  Creatinine elevated to 2.14.  Lactic acid is 2.9.  Blood pressure has improved with IV fluids.  Will admit to medicine.  While awaiting admission to medicine patient continued to drop her blood pressures.  She has completed 30 cc/kg IV fluids and given additional IV fluid and still not holding her blood pressures.  She has had 1 L urine output.  Therefore decision was made to start her on IV Levophed and have her admitted to the critical care service.  This chart was dictated using voice recognition software.  Despite best efforts to proofread,  errors can occur which can change the documentation meaning.      Final Clinical Impression(s) / ED Diagnoses Final diagnoses:  Sepsis, due to unspecified organism, unspecified whether acute organ dysfunction present [facility])  Acute cystitis without hematuria  AKI (acute kidney injury) (HCC)  Septic shock [facility])    Rx / DC Orders ED Discharge Orders      None         Virgina Norfolk, DO 02/15/22 1748    Virgina Norfolk, DO 02/15/22 2342

## 2022-02-15 NOTE — Progress Notes (Signed)
Elink following sepsis bundle. °

## 2022-02-15 NOTE — ED Triage Notes (Signed)
Pt bib ems from [facility] for ams and possible infection. Pt hypotensive w/ ems and upon arrival to ed.  Pt given NS by ems.

## 2022-02-15 NOTE — Progress Notes (Signed)
eLink Physician-Brief Progress Note Patient Name: Kathleen Jensen DOB: 05-23-1951 MRN: [id number]   Date of Service  02/15/2022  HPI/Events of Note  Patient admitted with septic shock, altered mental status,  atrial fibrillation with RVR, work up of sepsis is in progress.  eICU Interventions  New Patient Evaluation, Amiodarone bolus + gtt for atrial fibrillation with RVR, ABG, ordered, BNP ordered to assess volume status, echocardiogram.        Kathleen Jensen 02/15/2022, 10:13 PM

## 2022-02-16 ENCOUNTER — Inpatient Hospital Stay (HOSPITAL_COMMUNITY): Payer: Medicare HMO

## 2022-02-16 DIAGNOSIS — I9589 Other hypotension: Secondary | ICD-10-CM | POA: Diagnosis not present

## 2022-02-16 DIAGNOSIS — N3000 Acute cystitis without hematuria: Secondary | ICD-10-CM | POA: Diagnosis not present

## 2022-02-16 DIAGNOSIS — R6521 Severe sepsis with septic shock: Secondary | ICD-10-CM | POA: Diagnosis not present

## 2022-02-16 DIAGNOSIS — A419 Sepsis, unspecified organism: Secondary | ICD-10-CM | POA: Diagnosis not present

## 2022-02-16 LAB — ECHOCARDIOGRAM COMPLETE
Area-P 1/2: 8.34 cm2
Height: 65 in
S' Lateral: 3.2 cm
Weight: 2934.76 oz

## 2022-02-16 LAB — LACTIC ACID, PLASMA
Lactic Acid, Venous: 5.6 mmol/L (ref 0.5–1.9)
Lactic Acid, Venous: 2.5 mmol/L (ref 0.5–1.9)
Lactic Acid, Venous: 2.7 mmol/L (ref 0.5–1.9)

## 2022-02-16 LAB — PHOSPHORUS: Phosphorus: 4.2 mg/dL (ref 2.5–4.6)

## 2022-02-16 LAB — BASIC METABOLIC PANEL
Anion gap: 14 (ref 5–15)
BUN: 45 mg/dL — ABNORMAL HIGH (ref 8–23)
CO2: 18 mmol/L — ABNORMAL LOW (ref 22–32)
Calcium: 7.9 mg/dL — ABNORMAL LOW (ref 8.9–10.3)
Chloride: 111 mmol/L (ref 98–111)
Creatinine, Ser: 1.7 mg/dL — ABNORMAL HIGH (ref 0.44–1.00)
GFR, Estimated: 32 mL/min — ABNORMAL LOW (ref >60)
Glucose, Bld: 116 mg/dL — ABNORMAL HIGH (ref 70–99)
Potassium: 3.2 mmol/L — ABNORMAL LOW (ref 3.5–5.1)
Sodium: 143 mmol/L (ref 135–145)

## 2022-02-16 LAB — PROCALCITONIN: Procalcitonin: 4.6 ng/mL

## 2022-02-16 LAB — CBC
HCT: 37.3 % (ref 36.0–46.0)
Hemoglobin: 11.5 g/dL — ABNORMAL LOW (ref 12.0–15.0)
MCH: 25.8 pg — ABNORMAL LOW (ref 26.0–34.0)
MCHC: 30.8 g/dL (ref 30.0–36.0)
MCV: 83.6 fL (ref 80.0–100.0)
Platelets: 147 10*3/uL — ABNORMAL LOW (ref 150–400)
RBC: 4.46 MIL/uL (ref 3.87–5.11)
RDW: 18.5 % — ABNORMAL HIGH (ref 11.5–15.5)
WBC: 16.3 10*3/uL — ABNORMAL HIGH (ref 4.0–10.5)
nRBC: 0 % (ref 0.0–0.2)

## 2022-02-16 LAB — MAGNESIUM: Magnesium: 1.7 mg/dL (ref 1.7–2.4)

## 2022-02-16 MED ORDER — ALBUMIN HUMAN 5 % IV SOLN: 25.0000 g | Freq: Once | INTRAVENOUS | Status: DC

## 2022-02-16 MED ORDER — LEVETIRACETAM 500 MG PO TABS
1000.0000 mg | ORAL_TABLET | Freq: Two times a day (BID) | ORAL | Status: DC
  Administered 2022-02-16: 1000 mg
  Filled 2022-02-16 (×3): qty 2

## 2022-02-16 MED ORDER — ALBUMIN HUMAN 5 % IV SOLN
12.5000 g | Freq: Once | INTRAVENOUS | Status: AC
Start: 2022-02-16 — End: 2022-02-16
  Administered 2022-02-16: 12.5 g via INTRAVENOUS
  Filled 2022-02-16: qty 250

## 2022-02-16 MED ORDER — SENNOSIDES 8.8 MG/5ML PO SYRP
10.0000 mL | ORAL_SOLUTION | Freq: Two times a day (BID) | ORAL | Status: DC
  Administered 2022-02-16 – 2022-02-22 (×12): 10 mL
  Filled 2022-02-16 (×13): qty 10

## 2022-02-16 MED ORDER — POTASSIUM CHLORIDE 10 MEQ/100ML IV SOLN
10.0000 meq | INTRAVENOUS | Status: AC
  Administered 2022-02-16 (×6): 10 meq via INTRAVENOUS
  Filled 2022-02-16 (×6): qty 100

## 2022-02-16 MED ORDER — VANCOMYCIN HCL 1500 MG/300ML IV SOLN: 1500.0000 mg | INTRAVENOUS | Status: DC

## 2022-02-16 MED ORDER — LACTATED RINGERS IV BOLUS
500.0000 mL | Freq: Once | INTRAVENOUS | Status: AC
  Administered 2022-02-16: 500 mL via INTRAVENOUS

## 2022-02-16 MED ORDER — PERFLUTREN LIPID MICROSPHERE
1.0000 mL | INTRAVENOUS | Status: AC | PRN
  Administered 2022-02-16: 5 mL via INTRAVENOUS

## 2022-02-16 MED ORDER — MAGNESIUM SULFATE 2 GM/50ML IV SOLN
2.0000 g | Freq: Once | INTRAVENOUS | Status: AC
  Administered 2022-02-16: 2 g via INTRAVENOUS
  Filled 2022-02-16: qty 50

## 2022-02-16 MED ORDER — LEVETIRACETAM 100 MG/ML PO SOLN
1000.0000 mg | Freq: Two times a day (BID) | ORAL | Status: DC
  Administered 2022-02-16: 1000 mg
  Filled 2022-02-16 (×2): qty 10

## 2022-02-16 NOTE — Progress Notes (Signed)
NAME:  Kathleen Jensen, MRN:  [id number], DOB:  July 09, 1951, LOS: 1 ADMISSION DATE:  02/15/2022, CONSULTATION DATE:  7/21 REFERRING MD:  Lockie Mola, CHIEF COMPLAINT: Altered mental status  History of Present Illness:   Kathleen Jensen, is a 71 y.o. female, who presented to the [facility] ED with a chief complaint of altered mental status.  They have a pertinent past medical history of chronic sacral wound, stroke, diabetes, seizures, CKD unspecified, GERD, hypertension, hyperlipidemia  Patient is a resident of skilled nursing facility.  Presented to the ED after developing altered mental status.  Patient became hypotensive with EMS.  ED course was notable for ongoing hypotension with a pressure of 79/51.  WBC 16.8, Tmax 100.2, lactate 2.9, UA concerning for UTI, AKI.  A code sepsis was called.  Blood cultures and urine cultures were obtained.  She was given a dose of vancomycin and cefepime. EDP ordered 30 cc/kg of IVF.  Ongoing hypotension with IV fluids, she was started on peripheral pressors.  PCCM was consulted for admission.   Pertinent  Medical History  chronic sacral wound, stroke, diabetes, seizures, CKD unspecified, GERD, hypertension, hyperlipidemia  Significant Hospital Events: Including procedures, antibiotic start and stop dates in addition to other pertinent events   7/21 Presented to ED, PCCM consult, VANC/CEFE BC>, UC  Interim History / Subjective:  Remains encephalopathic On levophed 9  Objective   Blood pressure 117/88, pulse (!) 117, temperature 99.7 F (37.6 C), temperature source Oral, resp. rate (!) 23, height 5\' 5"  (1.651 m), weight 83.2 kg, SpO2 98 %.        Intake/Output Summary (Last 24 hours) at 02/16/2022 0752 Last data filed at 02/16/2022 0545 Gross per 24 hour  Intake 3898.12 ml  Output 1650 ml  Net 2248.12 ml   Filed Weights   02/15/22 1643 02/15/22 2214 02/16/22 0442  Weight: 78 kg 82.4 kg 83.2 kg    Physical Exam: General: Elderly,  critically ill-appearing, encephalopathic, agrees to every question HENT: [place], AT, OP clear, MMM Eyes: EOMI, no scleral icterus Respiratory: Clear to auscultation bilaterally.  No crackles, wheezing or rales Cardiovascular: Tachycardic, RR, -M/R/G, no JVD GI: BS+, soft, nontender, G-tube in place Extremities:-Edema,-tenderness Neuro: Awake, encephalopathic, intermittently follows commands GU: Foley in place Skin: Sacral wound covered  Resolved Hospital Problem list     Assessment & Plan:  Septic shock, suspect secondary to urosepsis Lactic acidosis, secondary to above UA concerning for UTI.  WBC 16.8.  Tmax 100.2.  Lactate 2.9.  Resident of skilled nursing facility.  Chest x-ray without pneumothorax, effusion, infiltrate.  EKG: Sinus rhythm, no ST changes noted.  At least 2 L of IV fluid ordered by EDP.  I & O's unclear. -LR bolus 500 cc  -Wean peripheral levophed MAP 65 or greater.  -Continue mIVF -Follow up BC and UC -On Cefepime and Vancomycin. Narrow as cultures result -Trend lactate -F/u echo  Acute kidney injury Creat 2.14 (baseline 0.6-0.8) -Ensure renal perfusion. Goal MAP 65 or greater. -Avoid neprotoxic drugs as possible. -Strict I&O's -Trend BMET/Cr -Continue foley -Renal ultrasound  Acute metabolic encephalopathy, suspect secondary to sepsis, shock CT head neg -supportive care as above  HX HTN HX HLD Currently hypotensive -Hold home antihypertensives. -Resume statin once stabilized  GERD -PPI  HX CVA 2018 HX Seizure History of right hemiplegia and expressive aphasia.  On Dilantin, valproic acid and Keppra at home. -Resume Keppra -Resume valproic acid -Resume dilantin -Patient not on antiplatelet agents per home medications.  Hypothyroidism -continue synthroid  DM2 -Blood Glucose goal 140-180. -SSI  Chronic sacral wound -Wound care consult placed  Best Practice (right click and "Reselect all SmartList Selections" daily)   Diet/type:  NPO DVT prophylaxis: prophylactic heparin  GI prophylaxis: PPI Lines: N/A Foley:  Yes, and it is still needed Code Status:  full code Last date of multidisciplinary goals of care discussion [reviewed patient's status with her daughter Kathleen Jensen at bedside 7/21.  Patient's oldest daughter Kathleen Jensen is her healthcare spokesperson who is currently out of the country.  There is a MOST form signed by Kathleen Jensen from March 2023 that indicates that the patient is full code.  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 She has never used smokeless tobacco. She reports that she does not drink alcohol and does not use drugs.   Family History:  Her family history includes Cancer in her brother and sister; Diabetes in her father and mother; High blood pressure in her mother; Stroke in her father.   Allergies Allergies  Allergen Reactions   Tape Rash   Meperidine Hcl Nausea And Vomiting   Sulfonamide Derivatives Itching   Bacitracin-Polymyxin B Dermatitis    "Cloth Band-Aid only"   Oxycodone Itching     Home Medications  Prior to Admission medications   Medication Sig Start Date End Date Taking? Authorizing Provider  acetaminophen (TYLENOL) 500 MG tablet Take 1,000 mg by mouth every 8 (eight) hours.    [provider]  B Complex-C-Folic Acid TABS Take 1 tablet by mouth daily.    [provider]  Baclofen 5 MG TABS Take 5 mg by mouth in the morning and at bedtime.    [provider]  bisacodyl (DULCOLAX) 5 MG EC tablet Take 1 tablet (5 mg total) by  mouth in the morning and at bedtime. 06/06/21   Terrilee Croak, MD  CALCIUM CITRATE PO Take 200 mg by mouth in the morning and at bedtime.    [provider]  DULoxetine (CYMBALTA) 30 MG capsule Take 30 mg by mouth daily. 05/24/21   [provider]  Lactobacillus (PROBIOTIC ACIDOPHILUS) CAPS Take 1 capsule by mouth daily.    [provider]  levETIRAcetam (KEPPRA) 1000 MG tablet Take 2 tablets (2,000 mg total) by mouth 2 (two) times daily. 04/14/20   Rai, Vernelle Emerald, MD  levothyroxine (SYNTHROID) 75 MCG tablet Take 75 mcg by mouth daily before breakfast.    [provider]  Lidocaine 4 % PTCH Apply 1 patch topically daily.    [provider]  losartan-hydrochlorothiazide (HYZAAR) 50-12.5 MG tablet Take 1 tablet by mouth daily.    [provider]  ondansetron (ZOFRAN-ODT) 4 MG disintegrating tablet Take 4 mg by mouth in the morning, at noon, and at bedtime. 05/24/21   [provider]  phenytoin (DILANTIN) 125 MG/5ML suspension Take 6 mLs (150 mg total) by mouth 2 (two) times daily. 06/06/21   Terrilee Croak, MD  phenytoin (DILANTIN) 125 MG/5ML suspension Take 4 mLs (100  mg total) by mouth daily. 06/06/21   Dahal, Melina Schools, MD  polyethylene glycol (MIRALAX / GLYCOLAX) 17 g packet Take 34 g by mouth 2 (two) times daily. 06/06/21   Lorin Glass, MD  pregabalin (LYRICA) 50 MG capsule Take 50 mg by mouth at bedtime. 04/03/21   [provider]  senna-docusate (SENOKOT-S) 8.6-50 MG tablet Take 2 tablets by mouth 2 (two) times daily.    [provider]  simvastatin (ZOCOR) 10 MG tablet Take 10 mg by mouth daily.    [provider]  valproic acid (DEPAKENE) 250 MG/5ML solution Take 500 mg by mouth 4 (four) times daily.    [provider]     Critical care time: 61 minutes    Mechele Collin, M.D. [facility] Pulmonary/Critical Care Medicine 02/16/2022 7:52 AM   See Amion for personal pager For hours between 7 PM to 7 AM,  please call Elink for urgent questions

## 2022-02-16 NOTE — Progress Notes (Signed)
Pharmacy Antibiotic Note  Kathleen Jensen is a 71 y.o. female admitted on 02/15/2022 with sepsis.  Pharmacy has been consulted for Vanco, Cefepime dosing.  ID: Septic shock (MD suspects urinary source). PC 1.5>4.6 today.  LA 6.9>5.6 - Tmax 102.3, Tc 99.7. WBC 16.3 up today, Scr 1.7  Antimicrobials this admission: 7/21 Ceftriaxone x 1 7/21 Vancomycin >> 7/21 Cefepime >>   Dose adjustments this admission: -- 7/21 Vanc 1500mg  x 1, then 1250mg  q48h (eAUC 497, SCr 2.14) - 7/22: Chg to Vanco 1500mg  IV q48h (Auc 513, Scr 1.7)  Plan: Incr Vanco to 1500mg  IV q48h Cefepime 2g q24h   Height: 5\' 5"  (165.1 cm) Weight: 83.2 kg (183 lb 6.8 oz) IBW/kg (Calculated) : 57  Temp (24hrs), Avg:100.2 F (37.9 C), Min:97.9 F (36.6 C), Max:102.3 F (39.1 C)  Recent Labs  Lab 02/15/22 1715 02/15/22 2120 02/16/22 0147  WBC 16.8* 13.1* 16.3*  CREATININE 2.14* 1.67* 1.70*  LATICACIDVEN 2.9* 6.9* 5.6*    Estimated Creatinine Clearance: 32.3 mL/min (A) (by C-G formula based on SCr of 1.7 mg/dL (H)).    Allergies  Allergen Reactions   Tape Rash   Meperidine Hcl Nausea And Vomiting   Sulfonamide Derivatives Itching   Bacitracin-Polymyxin B Dermatitis    "Cloth Band-Aid only"   Oxycodone Itching    [name] S. , PharmD, BCPS Clinical Staff Pharmacist [email or web address]  02/17/22 02/16/2022 7:57 AM

## 2022-02-16 NOTE — Progress Notes (Signed)
eLink Physician-Brief Progress Note Patient Name: Kathleen Jensen DOB: 10/27/1950 MRN: [id number]   Date of Service  02/16/2022  HPI/Events of Note  ABG reviewed and acceptable.  eICU Interventions  No new intervention indicated at this time.        Almas Rake U Molly Maselli 02/16/2022, 12:00 AM

## 2022-02-16 NOTE — Progress Notes (Signed)
eLink Physician-Brief Progress Note Patient Name: Kathleen Jensen DOB: 04-30-1951 MRN: [id number]   Date of Service  02/16/2022  HPI/Events of Note  BP 105/70, MAP 79, on 10 mcg of Norepinephrine gtt after receiving a 250 ml 5 % albumin bolus, lactic acid slightly lower at 5.6.  eICU Interventions  Will trend lactic acid and consider additional albumin bolus if lactic acid fails to improve or worsens.        Kathleen Jensen 02/16/2022, 4:15 AM

## 2022-02-16 NOTE — Progress Notes (Signed)
[facility] ADULT ICU REPLACEMENT PROTOCOL   The patient does apply for the [facility] Adult ICU Electrolyte Replacment Protocol based on the criteria listed below:   1.Exclusion criteria: TCTS patients, ECMO patients, and Dialysis patients 2. Is GFR >/= 30 ml/min? Yes.    Patient's GFR today is 32 3. Is SCr </= 2? Yes.   Patient's SCr is 1.70 mg/dL 4. Did SCr increase >/= 0.5 in 24 hours? No. 5.Pt's weight >40kg  Yes.   6. Abnormal electrolyte(s): K+ 3.2, mag 1.7  7. Electrolytes replaced per protocol 8.  Call MD STAT for K+ </= 2.5, Phos </= 1, or Mag </= 1 Physician:  Reyne Dumas 02/16/2022 3:30 AM

## 2022-02-16 NOTE — Progress Notes (Signed)
eLink Physician-Brief Progress Note Patient Name: Kathleen Jensen DOB: 1950-11-09 MRN: [id number]   Date of Service  02/16/2022  HPI/Events of Note  Hypotension (69/40), BNP 442.  eICU Interventions  Albumin 500 ml iv fluid bolus x 1, LR gtt reduced to 100 ml /hour since she's already had 4 liters of crystalloid, Norepinephrine gtt which was paused by bedside RN resumed.        Kathleen Jensen 02/16/2022, 1:53 AM

## 2022-02-16 NOTE — Progress Notes (Signed)
eLink Physician-Brief Progress Note Patient Name: Kathleen Jensen DOB: Dec 08, 1950 MRN: [id number]   Date of Service  02/16/2022  HPI/Events of Note  EKG reviewed.  eICU Interventions  MAR scanned to  confirm that there are no other QT prolonging medications on the MAR.        [name] 02/16/2022, 5:41 AM

## 2022-02-17 ENCOUNTER — Inpatient Hospital Stay (HOSPITAL_COMMUNITY): Payer: Medicare HMO

## 2022-02-17 DIAGNOSIS — R6521 Severe sepsis with septic shock: Secondary | ICD-10-CM | POA: Diagnosis not present

## 2022-02-17 DIAGNOSIS — A419 Sepsis, unspecified organism: Secondary | ICD-10-CM | POA: Diagnosis not present

## 2022-02-17 LAB — URINE CULTURE: Culture: >=100000 — AB

## 2022-02-17 LAB — URINALYSIS, ROUTINE W REFLEX MICROSCOPIC
Bacteria, UA: NONE SEEN
Bilirubin Urine: NEGATIVE
Glucose, UA: NEGATIVE mg/dL
Ketones, ur: NEGATIVE mg/dL
Nitrite: NEGATIVE
Protein, ur: 100 mg/dL — AB
RBC / HPF: >50 RBC/hpf — ABNORMAL HIGH (ref 0–5)
Specific Gravity, Urine: 1.025 (ref 1.005–1.030)
WBC, UA: >50 WBC/hpf — ABNORMAL HIGH (ref 0–5)
pH: 5 (ref 5.0–8.0)

## 2022-02-17 LAB — CBC
HCT: 29.9 % — ABNORMAL LOW (ref 36.0–46.0)
Hemoglobin: 9.3 g/dL — ABNORMAL LOW (ref 12.0–15.0)
MCH: 25.8 pg — ABNORMAL LOW (ref 26.0–34.0)
MCHC: 31.1 g/dL (ref 30.0–36.0)
MCV: 83.1 fL (ref 80.0–100.0)
Platelets: 117 10*3/uL — ABNORMAL LOW (ref 150–400)
RBC: 3.6 MIL/uL — ABNORMAL LOW (ref 3.87–5.11)
RDW: 18.4 % — ABNORMAL HIGH (ref 11.5–15.5)
WBC: 14.7 10*3/uL — ABNORMAL HIGH (ref 4.0–10.5)
nRBC: 0 % (ref 0.0–0.2)

## 2022-02-17 LAB — BASIC METABOLIC PANEL
Anion gap: 7 (ref 5–15)
Anion gap: 5 (ref 5–15)
BUN: 30 mg/dL — ABNORMAL HIGH (ref 8–23)
BUN: 29 mg/dL — ABNORMAL HIGH (ref 8–23)
CO2: 21 mmol/L — ABNORMAL LOW (ref 22–32)
CO2: 23 mmol/L (ref 22–32)
Calcium: 7.5 mg/dL — ABNORMAL LOW (ref 8.9–10.3)
Calcium: 8.1 mg/dL — ABNORMAL LOW (ref 8.9–10.3)
Chloride: 112 mmol/L — ABNORMAL HIGH (ref 98–111)
Chloride: 113 mmol/L — ABNORMAL HIGH (ref 98–111)
Creatinine, Ser: 0.77 mg/dL (ref 0.44–1.00)
Creatinine, Ser: 0.79 mg/dL (ref 0.44–1.00)
GFR, Estimated: >60 mL/min (ref >60)
GFR, Estimated: >60 mL/min (ref >60)
Glucose, Bld: 112 mg/dL — ABNORMAL HIGH (ref 70–99)
Glucose, Bld: 96 mg/dL (ref 70–99)
Potassium: 3.1 mmol/L — ABNORMAL LOW (ref 3.5–5.1)
Potassium: 4.8 mmol/L (ref 3.5–5.1)
Sodium: 140 mmol/L (ref 135–145)
Sodium: 141 mmol/L (ref 135–145)

## 2022-02-17 LAB — PROCALCITONIN: Procalcitonin: 5.14 ng/mL

## 2022-02-17 LAB — LACTIC ACID, PLASMA: Lactic Acid, Venous: 1.6 mmol/L (ref 0.5–1.9)

## 2022-02-17 MED ORDER — LEVETIRACETAM IN NACL 1000 MG/100ML IV SOLN
1000.0000 mg | Freq: Two times a day (BID) | INTRAVENOUS | Status: DC
Start: 2022-02-17 — End: 2022-02-18
  Administered 2022-02-17 – 2022-02-18 (×3): 1000 mg via INTRAVENOUS
  Filled 2022-02-17 (×3): qty 100

## 2022-02-17 MED ORDER — IOHEXOL 300 MG/ML  SOLN
100.0000 mL | Freq: Once | INTRAMUSCULAR | Status: AC | PRN
  Administered 2022-02-17: 100 mL via INTRAVENOUS

## 2022-02-17 MED ORDER — LIP MEDEX EX OINT
TOPICAL_OINTMENT | CUTANEOUS | Status: DC | PRN
Start: 2022-02-17 — End: 2022-02-22
  Administered 2022-02-18: 75 via TOPICAL
  Filled 2022-02-17 (×2): qty 7

## 2022-02-17 MED ORDER — HYDROMORPHONE HCL 1 MG/ML IJ SOLN
INTRAMUSCULAR | Status: AC
  Administered 2022-02-17: 0.5 mg via INTRAVENOUS
  Filled 2022-02-17: qty 1

## 2022-02-17 MED ORDER — POTASSIUM CHLORIDE 10 MEQ/100ML IV SOLN
10.0000 meq | INTRAVENOUS | Status: AC
  Administered 2022-02-17 (×4): 10 meq via INTRAVENOUS
  Filled 2022-02-17 (×4): qty 100

## 2022-02-17 MED ORDER — KETOROLAC TROMETHAMINE 15 MG/ML IJ SOLN
15.0000 mg | Freq: Three times a day (TID) | INTRAMUSCULAR | Status: AC | PRN
  Administered 2022-02-18 (×2): 15 mg via INTRAVENOUS
  Filled 2022-02-17 (×2): qty 1

## 2022-02-17 MED ORDER — SODIUM CHLORIDE (PF) 0.9 % IJ SOLN
INTRAMUSCULAR | Status: AC
  Filled 2022-02-17: qty 50

## 2022-02-17 MED ORDER — SODIUM CHLORIDE 0.9 % IV SOLN
125.0000 mg | Freq: Three times a day (TID) | INTRAVENOUS | Status: DC
  Administered 2022-02-17 – 2022-02-18 (×3): 125 mg via INTRAVENOUS
  Filled 2022-02-17 (×6): qty 2.5

## 2022-02-17 MED ORDER — VANCOMYCIN HCL 1500 MG/300ML IV SOLN
1500.0000 mg | INTRAVENOUS | Status: DC
  Filled 2022-02-17: qty 300

## 2022-02-17 MED ORDER — HEPARIN SODIUM (PORCINE) 5000 UNIT/ML IJ SOLN
5000.0000 [IU] | Freq: Three times a day (TID) | INTRAMUSCULAR | Status: DC
  Administered 2022-02-17 – 2022-02-22 (×14): 5000 [IU] via SUBCUTANEOUS
  Filled 2022-02-17 (×14): qty 1

## 2022-02-17 MED ORDER — DEXTROSE 5 % IV SOLN
500.0000 mg | Freq: Four times a day (QID) | INTRAVENOUS | Status: DC
  Administered 2022-02-17 – 2022-02-18 (×5): 500 mg via INTRAVENOUS
  Filled 2022-02-17 (×6): qty 5

## 2022-02-17 MED ORDER — LACTATED RINGERS IV SOLN: INTRAVENOUS | Status: AC

## 2022-02-17 MED ORDER — SODIUM CHLORIDE 0.9 % IV SOLN
2.0000 g | Freq: Three times a day (TID) | INTRAVENOUS | Status: DC
  Administered 2022-02-17 – 2022-02-18 (×4): 2 g via INTRAVENOUS
  Filled 2022-02-17 (×4): qty 12.5

## 2022-02-17 MED ORDER — PROCHLORPERAZINE EDISYLATE 10 MG/2ML IJ SOLN
10.0000 mg | Freq: Four times a day (QID) | INTRAMUSCULAR | Status: DC | PRN
  Administered 2022-02-17: 10 mg via INTRAVENOUS
  Filled 2022-02-17: qty 2

## 2022-02-17 MED ORDER — HYDROMORPHONE HCL 1 MG/ML IJ SOLN: 0.5000 mg | Freq: Once | INTRAMUSCULAR | Status: AC

## 2022-02-17 MED ORDER — POTASSIUM CHLORIDE 20 MEQ PO PACK
20.0000 meq | PACK | ORAL | Status: AC
  Administered 2022-02-17: 20 meq
  Filled 2022-02-17 (×2): qty 1

## 2022-02-17 NOTE — Progress Notes (Signed)
eLink Physician-Brief Progress Note Patient Name: Kathleen Jensen DOB: Jun 27, 1951 MRN: [id number]   Date of Service  02/17/2022  HPI/Events of Note  Notified of nausea. Qtc >500.  Hematuria had also resolved.  eICU Interventions  Prochlorperazine prn ordered.  Resume heparin for DVT prophylaxis.  Heparin reordered.     Intervention Category Intermediate Interventions: Other:  Larinda Buttery 02/17/2022, 8:55 PM

## 2022-02-17 NOTE — Progress Notes (Signed)
[facility] ADULT ICU REPLACEMENT PROTOCOL   The patient does apply for the [facility] Adult ICU Electrolyte Replacment Protocol based on the criteria listed below:   1.Exclusion criteria: TCTS patients, ECMO patients, and Dialysis patients 2. Is GFR >/= 30 ml/min? Yes.    Patient's GFR today is >60 3. Is SCr </= 2? Yes.   Patient's SCr is 0.77 mg/dL 4. Did SCr increase >/= 0.5 in 24 hours? No. 5.Pt's weight >40kg  Yes.   6. Abnormal electrolyte(s): K  7. Electrolytes replaced per protocol 8.  Call MD STAT for K+ </= 2.5, Phos </= 1, or Mag </= 1 Physician:  Alpha Gula [facility] 02/17/2022 5:01 AM

## 2022-02-17 NOTE — Progress Notes (Signed)
eLink Physician-Brief Progress Note Patient Name: Kathleen Jensen DOB: 19-Apr-1951 MRN: [id number]   Date of Service  02/17/2022  HPI/Events of Note  Notified of hematuria.   Pt on vancomycin and cefepime for urosepsis but hematuria is of new onset.  Foley cath in place.  eICU Interventions  Hold heparin SQ.  Continue antibiotics.  Ok to resend sample for urinalysis. Recheck CBC in AM.      Intervention Category Intermediate Interventions: Bleeding - evaluation and treatment with blood products  Larinda Buttery 02/17/2022, 12:54 AM

## 2022-02-17 NOTE — Progress Notes (Addendum)
eLink Physician-Brief Progress Note Patient Name: Kathleen Jensen DOB: [birth year] MRN: [id number]   Date of Service  02/17/2022  HPI/Events of Note  Camera: Has a peg tube, hurting when touching her, getting tylenol. Not on pressors. Hemodynamically stable. RN asking for a KUB, Soft abdomen  Improving leukocytosis, LA. Uro sepsis, klebsiella. New a fib from admit on 21 st.  eICU Interventions  KUB, stat LA ordered. If LA going up, to consider CT abdomen.  Unlikely ischemic bowel.      Intervention Category Intermediate Interventions: Pain - evaluation and management  Ranee Gosselin 02/17/2022, 5:49 AM  Red alert: Camera to room and discussed. Pain at peg site increased and screaming.  Some yellow smelly yellowish drainage from side. On antibiotics.  - Dilaudid IV stat - get KUB, LA, stat.  - asp precautions. PEG is already clamped, not getting feeds Consider GI to see her.

## 2022-02-17 NOTE — Consult Note (Signed)
WOC Nurse Consult Note: Reason for Consult:Chronic, nonhealing full thickness, Stage 4 pressure injury. Photodocumentation of wound provided by EDP on 02/15/22. Wound type:Pressure Pressure Injury POA: Yes Measurement:Per Nursing Flow Sheet, 5.5cm x 5cm x 1.5cm Wound bed:Red, moist Drainage (amount, consistency, odor) serous to light yellow Periwound:intact with mild maceration, evidence of previous wound healing, contraction Dressing procedure/placement/frequency:Patient is on a mattress replacement with low air loss feature and turning and repositioning is in place to minimize time in the supine position. Nursing is provided guidance for the cleansing of the wound followed by placement of a silver hydrofiber dressing into the defect (absorptive, antimicrobial) and to top with dry gauze and secure with a silicone foam dressing.  WOC nursing team will not follow, but will remain available to this patient, the nursing and medical teams.  Please re-consult if needed.  Thank you for inviting [place] to participate in this patient's Plan of Care.  Ladona Mow, MSN, RN, CNS, GNP, Leda Min, [employer], [employer] phone:  [phone]

## 2022-02-17 NOTE — Progress Notes (Addendum)
Pharmacy Antibiotic Note  Kathleen Jensen is a 71 y.o. female admitted on 02/15/2022 with sepsis.  Pharmacy has been consulted for Vanco, Cefepime dosing.  ID: Septic shock (MD suspects urinary source). PC 1.5>4.6>>5.14.  LA 6.9>5.6>>2.7 - Tmax 102.2, Tc 98.8. WBC 14.7 trending down, Scr 0.77- improved to baseline -Urine cx + >100K Klebsiella pneumoniae & 40K proteus mirabilis.   Antimicrobials this admission: 7/21 Ceftriaxone x 1 7/21 Vancomycin >> 7/21 Cefepime >>   Dose adjustments this admission: -- 7/21 Vanc 1500mg  x 1, then 1250mg  q48h (eAUC 497, SCr 2.14) - 7/22: Chg to Vanco 1500mg  IV q48h (Auc 513, Scr 1.7) -7/23: Increase Cefepime 2gm IV q8h; Increase Vanc to 1500mg  IV q24h (AUC 476, Scr 0.8)  Plan: Incr Vanco to 1500mg  IV q24h.  Consider d/c since cx data + GNR Cefepime 2g q24h   Height: 5\' 5"  (165.1 cm) Weight: 83.2 kg (183 lb 6.8 oz) IBW/kg (Calculated) : 57  Temp (24hrs), Avg:100 F (37.8 C), Min:98.6 F (37 C), Max:102.2 F (39 C)  Recent Labs  Lab 02/15/22 1715 02/15/22 2120 02/16/22 0147 02/16/22 0852 02/16/22 1317 02/17/22 0256  WBC 16.8* 13.1* 16.3*  --   --  14.7*  CREATININE 2.14* 1.67* 1.70*  --   --  0.77  LATICACIDVEN 2.9* 6.9* 5.6* 2.5* 2.7*  --      Estimated Creatinine Clearance: 68.7 mL/min (by C-G formula based on SCr of 0.77 mg/dL).    Allergies  Allergen Reactions   Tape Rash   Meperidine Hcl Nausea And Vomiting   Sulfonamide Derivatives Itching   Bacitracin-Polymyxin B Dermatitis    "Cloth Band-Aid only"   Oxycodone Itching    02/17/22 PharmD 02/17/2022 5:42 AM

## 2022-02-17 NOTE — Progress Notes (Signed)
NAME:  Kathleen Jensen, MRN:  [id number], DOB:  [birth year], LOS: 2 ADMISSION DATE:  02/15/2022, CONSULTATION DATE:  7/21 REFERRING MD:  Lockie Mola, CHIEF COMPLAINT: Altered mental status  History of Present Illness:   Kathleen Jensen, is a 71 y.o. female, who presented to the [facility] ED with a chief complaint of altered mental status.  They have a pertinent past medical history of chronic sacral wound, stroke, diabetes, seizures, CKD unspecified, GERD, hypertension, hyperlipidemia  Patient is a resident of skilled nursing facility.  Presented to the ED after developing altered mental status.  Patient became hypotensive with EMS.  ED course was notable for ongoing hypotension with a pressure of 79/51.  WBC 16.8, Tmax 100.2, lactate 2.9, UA concerning for UTI, AKI.  A code sepsis was called.  Blood cultures and urine cultures were obtained.  She was given a dose of vancomycin and cefepime. EDP ordered 30 cc/kg of IVF.  Ongoing hypotension with IV fluids, she was started on peripheral pressors.  PCCM was consulted for admission.   Pertinent  Medical History  chronic sacral wound, stroke, diabetes, seizures, CKD unspecified, GERD, hypertension, hyperlipidemia  Significant Hospital Events: Including procedures, antibiotic start and stop dates in addition to other pertinent events   7/21 Presented to ED, PCCM consult, VANC/CEFE BC>, UC 7/22 Weaned off levophed  Interim History / Subjective:  Weaned off levophed yesterday Improved mental status however remains drowsy. Has abdominal pain and some purulent appearing draining expressed adjacent to PEG tube site per RN Had hematuria overnight but none this am  Objective   Blood pressure 105/75, pulse (!) 121, temperature 99.4 F (37.4 C), temperature source Axillary, resp. rate (!) 26, height 5\' 5"  (1.651 m), weight 83.2 kg, SpO2 93 %.        Intake/Output Summary (Last 24 hours) at 02/17/2022 0959 Last data filed at 02/17/2022  0839 Gross per 24 hour  Intake 1684.4 ml  Output 1500 ml  Net 184.4 ml   Filed Weights   02/15/22 1643 02/15/22 2214 02/16/22 0442  Weight: 78 kg 82.4 kg 83.2 kg   Physical Exam: General: Elderly-appearing, no acute distress, drowsy but will awaken to stimulation HENT: [place], AT, OP clear, MMM Eyes: EOMI, no scleral icterus Respiratory: Clear to auscultation bilaterally.  No crackles, wheezing or rales Cardiovascular: RRR, -M/R/G, no JVD GI: BS+, soft, diffuse/LUL tenderness, PEG tube, cleaned at insertion site Extremities:-Edema,-tenderness Neuro: Drowsy, answers simple questions, follows commands Skin: Sacral wound covered GU: Foley in place, clear urine   Resolved Hospital Problem list     Assessment & Plan:  Septic shock, suspect secondary to Klebsiella, Proteus UTI - shock resolved  Lactic acidosis - resolved UA concerning for UTI.  WBC 16.8.  Tmax 100.2.  Lactate 2.9.  Resident of skilled nursing facility.  Chest x-ray without pneumothorax, effusion, infiltrate.  EKG: Sinus rhythm, no ST changes noted.   -Off pressors -Follow up final BC and UC -On Cefepime. Narrow as cultures result. DC Vanc -CT A/P for abdominal/PEG pain  Acute kidney injury - resolved Transient hematuria - Suspect trauma related Creat 2.14 (baseline 0.6-0.8). Renal 02/18/22 normal -Start mIVF x 8 hours for CT abd/pelvis -Ensure renal perfusion. Goal MAP 65 or greater. -Avoid neprotoxic drugs as possible. -Strict I&O's -Trend BMET/Cr -Continue foley  Acute metabolic encephalopathy, suspect secondary to sepsis, shock CT head neg Improving mental status -supportive care as above  AFRVR HX HTN HX HLD -IV amio -Hold home antihypertensives. -Resume statin once stabilized  GERD -PPI  HX CVA 2018 HX Seizure History of right hemiplegia and expressive aphasia.  On Dilantin, valproic acid and Keppra at home. -Resume Keppra -Resume valproic acid -Resume dilantin -Patient not on antiplatelet  agents per home medications.  Hypothyroidism -continue synthroid  DM2 -Blood Glucose goal 140-180. -SSI  Chronic sacral wound -Wound care consult placed  Best Practice (right click and "Reselect all SmartList Selections" daily)   Diet/type: NPO Plan to start tube feeds if CT A/P normal DVT prophylaxis: prophylactic heparin  GI prophylaxis: PPI Lines: N/A Foley:  Yes, and it is still needed Code Status:  full code Last date of multidisciplinary goals of care discussion [reviewed patient's status with her daughter Elnita Maxwell at bedside 7/21.  Patient's oldest daughter Elzie Rings is her healthcare spokesperson who is currently out of the country.  There is a MOST form signed by Elzie Rings from March 2023 that indicates that the patient is full code.  Spoke with Elzie Rings 7/22 who confirmed full code and requested aggressive care including pressors, mechanical ventilation and CPR.]  Updated [name] 7/23  Critical care time: N/A    Transfer to [facility] for pick-up 7/24  Care Time: 50 min  Mechele Collin, M.D. [facility] Pulmonary/Critical Care Medicine 02/17/2022 10:00 AM   See [employer]ion for personal pager For hours between 7 PM to 7 AM, please call Elink for urgent questions

## 2022-02-18 DIAGNOSIS — I10 Essential (primary) hypertension: Secondary | ICD-10-CM

## 2022-02-18 DIAGNOSIS — A419 Sepsis, unspecified organism: Secondary | ICD-10-CM | POA: Diagnosis not present

## 2022-02-18 DIAGNOSIS — Z978 Presence of other specified devices: Secondary | ICD-10-CM | POA: Diagnosis not present

## 2022-02-18 DIAGNOSIS — I480 Paroxysmal atrial fibrillation: Secondary | ICD-10-CM

## 2022-02-18 DIAGNOSIS — N179 Acute kidney failure, unspecified: Secondary | ICD-10-CM | POA: Diagnosis not present

## 2022-02-18 DIAGNOSIS — I4891 Unspecified atrial fibrillation: Secondary | ICD-10-CM

## 2022-02-18 DIAGNOSIS — L8990 Pressure ulcer of unspecified site, unspecified stage: Secondary | ICD-10-CM | POA: Insufficient documentation

## 2022-02-18 DIAGNOSIS — Z931 Gastrostomy status: Secondary | ICD-10-CM

## 2022-02-18 LAB — CBC
HCT: 33.7 % — ABNORMAL LOW (ref 36.0–46.0)
Hemoglobin: 10.3 g/dL — ABNORMAL LOW (ref 12.0–15.0)
MCH: 26.3 pg (ref 26.0–34.0)
MCHC: 30.6 g/dL (ref 30.0–36.0)
MCV: 86 fL (ref 80.0–100.0)
Platelets: 120 10*3/uL — ABNORMAL LOW (ref 150–400)
RBC: 3.92 MIL/uL (ref 3.87–5.11)
RDW: 19 % — ABNORMAL HIGH (ref 11.5–15.5)
WBC: 14.5 10*3/uL — ABNORMAL HIGH (ref 4.0–10.5)
nRBC: 0 % (ref 0.0–0.2)

## 2022-02-18 LAB — BASIC METABOLIC PANEL
Anion gap: 6 (ref 5–15)
BUN: 23 mg/dL (ref 8–23)
CO2: 21 mmol/L — ABNORMAL LOW (ref 22–32)
Calcium: 8 mg/dL — ABNORMAL LOW (ref 8.9–10.3)
Chloride: 114 mmol/L — ABNORMAL HIGH (ref 98–111)
Creatinine, Ser: 0.57 mg/dL (ref 0.44–1.00)
GFR, Estimated: >60 mL/min (ref >60)
Glucose, Bld: 91 mg/dL (ref 70–99)
Potassium: 4.2 mmol/L (ref 3.5–5.1)
Sodium: 141 mmol/L (ref 135–145)

## 2022-02-18 LAB — MAGNESIUM: Magnesium: 1.9 mg/dL (ref 1.7–2.4)

## 2022-02-18 MED ORDER — LEVETIRACETAM 100 MG/ML PO SOLN
1000.0000 mg | Freq: Two times a day (BID) | ORAL | Status: DC
  Administered 2022-02-18 – 2022-02-22 (×8): 1000 mg
  Filled 2022-02-18 (×9): qty 10

## 2022-02-18 MED ORDER — VALPROIC ACID 250 MG/5ML PO SOLN
500.0000 mg | Freq: Four times a day (QID) | ORAL | Status: DC
  Administered 2022-02-18 – 2022-02-22 (×15): 500 mg
  Filled 2022-02-18 (×18): qty 10

## 2022-02-18 MED ORDER — SODIUM CHLORIDE 0.9 % IV SOLN
125.0000 mg | Freq: Three times a day (TID) | INTRAVENOUS | Status: AC
  Administered 2022-02-18: 125 mg via INTRAVENOUS
  Filled 2022-02-18: qty 2.5

## 2022-02-18 MED ORDER — PHENYTOIN 125 MG/5ML PO SUSP
187.5000 mg | Freq: Two times a day (BID) | ORAL | Status: DC
  Administered 2022-02-18 – 2022-02-22 (×8): 187.5 mg
  Filled 2022-02-18 (×9): qty 8

## 2022-02-18 MED ORDER — METOPROLOL TARTRATE 25 MG/10 ML ORAL SUSPENSION
12.5000 mg | Freq: Two times a day (BID) | ORAL | Status: DC
  Administered 2022-02-18 – 2022-02-22 (×8): 12.5 mg
  Filled 2022-02-18 (×9): qty 5

## 2022-02-18 MED ORDER — CEFAZOLIN SODIUM-DEXTROSE 2-4 GM/100ML-% IV SOLN
2.0000 g | Freq: Three times a day (TID) | INTRAVENOUS | Status: DC
  Administered 2022-02-18 – 2022-02-22 (×12): 2 g via INTRAVENOUS
  Filled 2022-02-18 (×13): qty 100

## 2022-02-18 NOTE — Evaluation (Signed)
Clinical/Bedside Swallow Evaluation Patient Details  Name: Kathleen Jensen MRN: [id number] Date of Birth: 05-31-1951  Today's Date: 02/18/2022 Time: SLP Start Time (ACUTE ONLY): 1550 SLP Stop Time (ACUTE ONLY): 1605 SLP Time Calculation (min) (ACUTE ONLY): 15 min  Past Medical History:  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 female with PMH: CVA with residual aphasia, chronic sacral wound, DM, seizures, CKD, GERD, HTN, HLD. She is a SNF resident and presented to the hospital on 02/15/22 with AMS. In ED, she had ongoing hypotension, Tmax of 100.2, UA concerning for UTI, AKI. She had PEG 09/17/2021. CT head negative for acute intracranial abnormality. Chest x-ray without pneumothorax, effusion, infiltrate.    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 Patient did not demonstrate adequate oral preparatory phase  with spoon or cup, requiring SLP to place small amount of bolus in anterior portion of mouth. Patient started 'chewing' with water in mouth as well as with puree solids in her mouth. No swallow was initiated and when cued to open her mouth, SLP observed boluses in anterior portion of mouth (behind bottom front teeth. SLP removed PO residuals with toothette swab. Unfortunately patient is not ready at this time for PO's but SLP will continue to follow her for PO readiness. SLP Visit Diagnosis: Dysphagia, unspecified (R13.10)    Aspiration Risk  Risk for inadequate nutrition/hydration    Diet Recommendation NPO   Medication Administration: Via alternative means    Other  Recommendations      Recommendations for follow up therapy are one component of a multi-disciplinary discharge planning process, led by the attending physician.  Recommendations may be updated based on patient status, additional functional criteria and insurance authorization.  Follow up Recommendations Skilled nursing-short term rehab (<3 hours/day)      Assistance Recommended at Discharge Frequent or constant Supervision/Assistance  Functional Status Assessment Patient has had a recent decline in their functional status and demonstrates the ability to make significant improvements in function in a reasonable and predictable amount of time.  Frequency and Duration min 2x/week  1 week       Prognosis Prognosis for Safe Diet Advancement: Good Barriers to Reach Goals: Time post onset;Severity of deficits  Swallow Study   General Date of Onset: 02/18/22 HPI: Patient is a 71 y.o. female with PMH: CVA with residual aphasia, chronic sacral wound, DM, seizures, CKD, GERD, HTN, HLD. She is a SNF resident and presented to the hospital on 02/15/22 with AMS. In ED, she had ongoing hypotension, Tmax of 100.2, UA concerning for UTI, AKI. She had PEG 09/17/2021. CT head negative for acute intracranial abnormality. Chest x-ray  without pneumothorax, effusion, infiltrate. Type of Study: Bedside Swallow Evaluation Previous Swallow Assessment: most recent BSE from 05/29/2021 Diet Prior to this Study: NPO Temperature Spikes Noted: No Respiratory Status: Room air History of Recent Intubation: No Behavior/Cognition: Alert;Cooperative;Requires cueing Oral Care Completed by SLP: Yes Oral Cavity - Dentition: Adequate natural dentition Self-Feeding Abilities: Needs assist;Needs set up Patient Positioning: Upright in bed Baseline Vocal Quality: Low vocal intensity Volitional Cough: Cognitively unable to elicit Volitional Swallow: Unable to elicit    Oral/Motor/Sensory Function Overall Oral Motor/Sensory Function: Other (comment) (limited secondary to patient's inability to perform majority of oral motor movements with verbal and demonstration cues)   Ice Chips     Thin Liquid Thin Liquid: Impaired Oral Phase Impairments: Reduced labial seal;Reduced lingual movement/coordination Other Comments: patient appeared to be 'chewing' with small amount of water placed in anterior portion of mouth but when SLP asked her to open her mouth, water could be seen just behind bottom front teeth    Nectar Thick     Honey Thick     Puree Puree: Impaired Oral Phase Impairments: Reduced lingual movement/coordination;Reduced labial seal;Poor awareness of bolus Other Comments: Patient making chewing motions with lips closed after SLP gave her 1/2 spoon bite of puree (applesauce). She did not initiate a swallow. SLP cleared PO residuals from anterior portion of oral cavity   Solid     Solid: Not tested     Angela Nevin, MA, CCC-SLP Speech Therapy

## 2022-02-18 NOTE — TOC Initial Note (Signed)
Transition of Care [facility]) - Initial/Assessment Note    Patient Details  Name: Kathleen Jensen MRN: [id number] Date of Birth: 1951/06/16  Transition of Care [facility]) CM/SW Contact:    Lennart Pall, LCSW Phone Number: 02/18/2022, 2:26 PM  Clinical Narrative:                 Met with pt this morning to introduce self/ TOC role.  Pt  smiling and receptive but unable to engage much.  She is agreeable for CSW to contact daughter, Severiano Gilbert. Spoke with daughter who confirms pt has been a resident at [facility] since 2020 and is in a LTC bed and plan for her to return when medically ready.  Have confirmed with facility that pt is cleared for return when ready.  CSW will follow along to assist with dc when appropriate.  Expected Discharge Plan: Skilled Nursing Facility Barriers to Discharge: Continued Medical Work up   Patient Goals and CMS Choice Patient states their goals for this hospitalization and ongoing recovery are:: return to LTC bed      Expected Discharge Plan and Services Expected Discharge Plan: [place] In-house Referral: Clinical Social Work     Living arrangements for the past 2 months: [place]                 DME Arranged: N/A DME Agency: NA                  Prior Living Arrangements/Services Living arrangements for the past 2 months: [place] Lives with:: Facility Resident Patient language and need for interpreter reviewed:: Yes        Need for Family Participation in Patient Care: No (Comment) Care giver support system in place?: Yes (comment)      Activities of Daily Living Home Assistive Devices/Equipment: Other (Comment) (PEG feeding supplies) ADL Screening (condition at time of admission) Patient's cognitive ability adequate to safely complete daily activities?: No Is the patient deaf or have difficulty hearing?:  (unknown) Does the patient have difficulty seeing, even when wearing  glasses/contacts?:  (unknown) Does the patient have difficulty concentrating, remembering, or making decisions?: Yes Patient able to express need for assistance with ADLs?: No Does the patient have difficulty dressing or bathing?: Yes Independently performs ADLs?: No Communication: Needs assistance Is this a change from baseline?: Pre-admission baseline Dressing (OT): Needs assistance Is this a change from baseline?: Pre-admission baseline Grooming: Needs assistance Is this a change from baseline?: Pre-admission baseline Feeding: Needs assistance Is this a change from baseline?: Pre-admission baseline Bathing: Needs assistance Is this a change from baseline?: Pre-admission baseline Toileting: Needs assistance Is this a change from baseline?: Pre-admission baseline In/Out Bed: Needs assistance Is this a change from baseline?: Pre-admission baseline Walks in Home: Dependent Is this a change from baseline?: Pre-admission baseline Does the patient have difficulty walking or climbing stairs?: Yes Weakness of Legs: Both Weakness of Arms/Hands: Both  Permission Sought/Granted Permission sought to share information with : Family Supports    Share Information with NAME: [place] granted to share info w Relationship: daughter  Permission granted to share info w Contact Information: [phone]  Emotional Assessment Appearance:: Appears stated age Attitude/Demeanor/Rapport: Gracious Affect (typically observed): Accepting Orientation: : Oriented to Self Alcohol / Substance Use: Not Applicable    Admission diagnosis:  Acute cystitis without hematuria [N30.00] AKI (acute kidney injury) ([place]) [N17.9] Septic shock ([place]) [A41.9, R65.21] Sepsis, due to unspecified organism, unspecified whether  acute organ dysfunction present [facility]) [A41.9] Patient Active Problem List   Diagnosis Date Noted   Pressure injury of skin 02/18/2022   Septic shock ([place]) 02/15/2022    Constipation    Abdominal pain    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   Right hemiplegia ([place]) 04/12/2020   UTI (urinary tract infection) 04/12/2020   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   Hyperlipidemia associated with type 2 diabetes mellitus ([place]) 07/30/2018   History of cerebral infarction 07/30/2018   Vitamin B 12 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/30/2018   Asthma 03/13/2018   Seizures ([place]) 01/12/2018   CVA (cerebral vascular accident) ([place]) 12/08/2017   Abnormal thyroid blood test 12/08/2017   Pulmonary embolism and infarction ([place]) 12/08/2017   Anemia 12/08/2017   Expressive aphasia 10/15/2017   Seizure disorder ([place]) 10/15/2017   Dysphagia 10/15/2017   Diabetes mellitus without complication ([place])    Hyperlipidemia 09/22/2017   Urinary frequency 09/22/2017   Airway hyperreactivity 01/26/2016   Disease of thyroid gland 01/26/2016   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   Other specified postprocedural states 01/26/2016   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   Chronic LBP 04/10/2015   Degeneration of intervertebral disc of lumbar region 04/10/2015   Spondylolisthesis of lumbar region 04/10/2015   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   History of cardiac pacemaker in situ 01/09/2015   B12 DEFICIENCY 08/15/2008   UNSPECIFIED VITAMIN D DEFICIENCY 08/15/2008   SINUS BRADYCARDIA 08/15/2008   OBSTRUCTIVE SLEEP APNEA 08/11/2008   INSOMNIA 04/28/2008   Hyperlipidemia LDL goal <70 12/12/2006   RHINITIS, ALLERGIC NEC 12/12/2006   Hypothyroidism 12/11/2006   Essential hypertension 12/11/2006   GERD 12/11/2006   STRESS INCONTINENCE 12/11/2006   PCP:  Patient, No Pcp Per Pharmacy:   [facility] [place] [address] Phone: [phone] Fax: [phone]  [place] [address] [place] [address] Phone: [phone] Fax:  [phone]     Social Determinants of Health (SDOH) Interventions    Readmission Risk Interventions    02/18/2022    2:23 PM  Readmission Risk Prevention Plan  Transportation Screening Complete  PCP or Specialist Appt within 5-7 Days Complete  Home Care Screening Complete  Medication Review (RN CM) Complete

## 2022-02-18 NOTE — NC FL2 (Signed)
[place] MEDICAID FL2 LEVEL OF CARE SCREENING TOOL     IDENTIFICATION  Patient Name: Kathleen Jensen Birthdate: 06/18/1951 Sex: female Admission Date (Current Location): 02/15/2022  [place] and [place] Number:    [id number] Facility and Address:         Provider Number: [id number]  Attending Physician Name and Address:  Erick Blinks, MD  Relative Name and Phone Number:  daughter, Parke Poisson [phone]    Current Level of Care: Hospital Recommended Level of Care: Skilled Nursing Facility Prior Approval Number:    Date Approved/Denied:   PASRR Number: [id number]  Discharge Plan: SNF    Current Diagnoses: Patient Active Problem List   Diagnosis Date Noted   Pressure injury of skin 02/18/2022   Septic shock ([facility]) 02/15/2022   Constipation    Abdominal pain    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   Right hemiplegia ([facility]) 04/12/2020   UTI (urinary tract infection) 04/12/2020   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   Hyperlipidemia associated with type 2 diabetes mellitus (H[facility]) 07/30/2018   History of cerebral infarction 07/30/2018   Vitamin B 12 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/30/2018   Asthma 03/13/2018   Seizures (H[facility]) 01/12/2018   CVA (cerebral vascular accident) (HCC) 12/08/2017   Abnormal thyroid blood test 12/08/2017   Pulmonary embolism and infarction (HCC) 12/08/2017   Anemia 12/08/2017   Expressive aphasia 10/15/2017   Seizure disorder (HCC) 10/15/2017   Dysphagia 10/15/2017   Diabetes mellitus without complication (H[facility])    Hyperlipidemia 09/22/2017   Urinary frequency 09/22/2017   Airway hyperreactivity 01/26/2016   Disease of thyroid gland 01/26/2016   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   Other specified postprocedural states 01/26/2016   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   Chronic LBP 04/10/2015   Degeneration of intervertebral disc of lumbar region 04/10/2015   Spondylolisthesis of lumbar region 04/10/2015    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   History of cardiac pacemaker in situ 01/09/2015   B12 DEFICIENCY 08/15/2008   UNSPECIFIED VITAMIN D DEFICIENCY 08/15/2008   SINUS BRADYCARDIA 08/15/2008   OBSTRUCTIVE SLEEP APNEA 08/11/2008   INSOMNIA 04/28/2008   Hyperlipidemia LDL goal <70 12/12/2006   RHINITIS, ALLERGIC NEC 12/12/2006   Hypothyroidism 12/11/2006   Essential hypertension 12/11/2006   GERD 12/11/2006   STRESS INCONTINENCE 12/11/2006    Orientation RESPIRATION BLADDER Height & Weight     Self  Normal Incontinent, External catheter Weight: 176 lb 9.4 oz (80.1 kg) Height:  5\' 5"  (165.1 cm)  BEHAVIORAL SYMPTOMS/MOOD NEUROLOGICAL BOWEL NUTRITION STATUS      Incontinent Feeding tube  AMBULATORY STATUS COMMUNICATION OF NEEDS Skin   Total Care Verbally PU Stage and Appropriate Care (silver hydrofiber)       PU Stage 4 Dressing: Daily               Personal Care Assistance Level of Assistance  Bathing, Feeding, Dressing, Total care     Dressing Assistance: Maximum assistance Total Care Assistance: Maximum assistance   Functional Limitations Info             SPECIAL CARE FACTORS FREQUENCY                       Contractures Contractures Info: Not present    Additional Factors Info  Allergies   Allergies Info: Tape, Meperidine Hcl, Sulfonamide Derivatives, Bacitracin-polymyxin B, Oxycodone           Current Medications (02/18/2022):  This is the current hospital active medication list Current Facility-Administered  Medications  Medication Dose Route Frequency Provider Last Rate Last Admin   0.9 %  sodium chloride infusion  250 mL Intravenous Continuous Leslye Peer, MD   Held at 02/15/22 1927   amiodarone (NEXTERONE PREMIX) 360-4.14 MG/200ML-% (1.8 mg/mL) IV infusion  30 mg/hr Intravenous Continuous Migdalia Dk, MD 16.67 mL/hr at 02/18/22 1354 30 mg/hr at 02/18/22 1354   ceFAZolin (ANCEF) IVPB 2g/100 mL premix  2 g Intravenous Q8H  Memon, Durward Mallard, MD       Chlorhexidine Gluconate Cloth 2 % PADS 6 each  6 each Topical Q0600 Leslye Peer, MD   6 each at 02/18/22 0539   heparin injection 5,000 Units  5,000 Units Subcutaneous Q8H Larinda Buttery, MD   5,000 Units at 02/18/22 1354   ketorolac (TORADOL) 15 MG/ML injection 15 mg  15 mg Intravenous Q8H PRN Luciano Cutter, MD   15 mg at 02/18/22 0539   lactated ringers bolus 1,000 mL  1,000 mL Intravenous Once Leslye Peer, MD   Held at 02/15/22 2314   levETIRAcetam (KEPPRA) 100 MG/ML solution 1,000 mg  1,000 mg Per Tube BID Erick Blinks, MD       levothyroxine (SYNTHROID) tablet 75 mcg  75 mcg Per Tube Q0600 Leslye Peer, MD   75 mcg at 02/18/22 0538   lip balm (CARMEX) ointment   Topical PRN Luciano Cutter, MD       pantoprazole sodium (PROTONIX) 40 mg/20 mL oral suspension 40 mg  40 mg Per Tube Daily Leslye Peer, MD   40 mg at 02/18/22 0935   phenytoin (DILANTIN) 125 MG/5ML suspension 187.5 mg  187.5 mg Per Tube BID Erick Blinks, MD       polyethylene glycol (MIRALAX / GLYCOLAX) packet 34 g  34 g Per Tube BID Leslye Peer, MD   34 g at 02/18/22 0935   prochlorperazine (COMPAZINE) injection 10 mg  10 mg Intravenous Q6H PRN Larinda Buttery, MD   10 mg at 02/17/22 2115   sennosides (SENOKOT) 8.8 MG/5ML syrup 10 mL  10 mL Per Tube BID Luciano Cutter, MD   10 mL at 02/18/22 0935   valproic acid (DEPAKENE) 250 MG/5ML solution 500 mg  500 mg Per Tube QID Erick Blinks, MD         Discharge Medications: Please see discharge summary for a list of discharge medications.  Relevant Imaging Results:  Relevant Lab Results:   Additional Information    Caitlynne Harbeck, LCSW

## 2022-02-18 NOTE — Progress Notes (Signed)
PROGRESS NOTE    Kathleen Jensen  [id number] DOB: 1950-09-26 DOA: 02/15/2022 PCP: Patient, No Pcp Per    Brief Narrative:  71 year old female who is a resident of a skilled nursing facility, chronic Foley catheter, sacral decubitus ulcer, PEG tube for poor p.o. 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  She was admitted to the ICU with septic shock and briefly required peripheral pressors.  She has been on antibiotics.  She also developed rapid atrial fibrillation started on amiodarone infusion.  Clinically she is stabilized and was transferred to [facility] service on 7/24   Assessment & Plan:   Principal Problem:   Septic shock [facility]) Active Problems:   Hypothyroidism   Essential hypertension   History of cardiac pacemaker in situ   Hyperlipidemia   Seizures (HCC)   Right hemiplegia (HCC)   UTI (urinary tract infection)   Pressure injury of skin   Chronic indwelling Foley catheter   New onset a-fib (HCC)   Acute septic shock, present on admission Secondary to UTI -Treated with broad-spectrum antibiotics (vancomycin and cefepime) -Urine culture positive for Klebsiella and Proteus -Based on culture sensitivities, antibiotics have been changed to Ancef -She did require peripheral pressors, but have since been weaned off -CT abdomen unrevealing  Urinary tract infection Chronic indwelling Foley catheter -Family reports catheter has been placed since February 2023 -Urine culture with Klebsiella and Proteus -Continue antibiotics  Acute kidney injury -Creatinine of 2.1 on admission -Improved with IV fluids  Rapid atrial fibrillation -New onset -CHA2DS2-VASc of 5 -Likely being driven by sepsis -Heart rates up to 170 earlier in hospital stay and she was started on amiodarone infusion -Heart rate is now down into the low 100s -She was taking metoprolol prior to  admission, will resume -Discussed anticoagulation with patient's daughter and she relates that she was told by patient's neurologist that she would be high risk for intracerebral bleeding.  She does have prior history of intracerebral hemorrhage -May be reasonable for patient to follow-up with her neurologist to discuss further anticoagulation  Sick sinus syndrome status post pacemaker -Daughter reports that battery for pacemaker was recently changed  Seizure disorder -Continue on Keppra, Dilantin, valproic acid  PEG tube dependent -Daughter reports that PEG tube was placed in February 2023 due to failure to thrive and poor p.o. intake -She said that SNF staff has largely stopped using PEG tube since she is now been eating by mouth -Seen by SLP today with recommendations to continue n.p.o. due to cognitive issues  Hyperlipidemia -Continue statin  Hypothyroidism -Continue Synthroid  Pressure injury, sacral, stage IV, present on admission -Appreciate WOC recommendations for dressing changes  Prior stroke/ICH -Residual expressive aphasia and right hemiplegia -She is a chronic resident of [facility] since 2020  Goals of care -Reviewed patient's wishes with her daughter -Cherece relates that patient had expressed that she would want all available treatments including CPR and ventilation -In the event that is determined that she would need these interventions long-term, she would not wish to continue on life-sustaining measures.  DVT prophylaxis: heparin injection 5,000 Units Start: 02/17/22 2200  Code Status: Full code Family Communication: Discussed with patient's daughter Severiano Gilbert over the phone Disposition Plan: Status is: Inpatient Remains inpatient appropriate because: Continued management of sepsis and rapid atrial fibrillation     Consultants:  PCCM  Procedures:    Antimicrobials:  Cefepime 7/21 > 7/24 Cefazolin 7/24 >  Subjective: She denies any  complaints at this time  Objective: Vitals:   02/18/22 1500 02/18/22 1600 02/18/22 1900 02/18/22 1932  BP: (!) 152/74 (!) 153/97 (!) 145/84   Pulse: 98 98 (!) 101 (!) 101  Resp: 18 18 19 19   Temp:  98.1 F (36.7 C)  98.6 F (37 C)  TempSrc:  Axillary  Oral  SpO2: 92% 92% 94% 94%  Weight:      Height:        Intake/Output Summary (Last 24 hours) at 02/18/2022 2005 Last data filed at 02/18/2022 1932 Gross per 24 hour  Intake 1313.27 ml  Output 4125 ml  Net -2811.73 ml   Filed Weights   02/15/22 2214 02/16/22 0442 02/18/22 0600  Weight: 82.4 kg 83.2 kg 80.1 kg    Examination:  General exam: Appears calm and comfortable  Respiratory system: Clear to auscultation. Respiratory effort normal. Cardiovascular system: S1 & S2 heard, irregular. No JVD, murmurs, rubs, gallops or clicks.  Gastrointestinal system: Abdomen is nondistended, soft and nontender. No organomegaly or masses felt. Normal bowel sounds heard. Central nervous system: She is awake, able to follow commands Extremities: No edema bilaterally Skin: Sacral decubitus ulcer Psychiatry: Speech is slow, difficult to comprehend at times    Data Reviewed: I have personally reviewed following labs and imaging studies  CBC: Recent Labs  Lab 02/15/22 1715 02/15/22 2120 02/16/22 0147 02/17/22 0256 02/18/22 0251  WBC 16.8* 13.1* 16.3* 14.7* 14.5*  NEUTROABS 12.8*  --   --   --   --   HGB 12.1 12.0 11.5* 9.3* 10.3*  HCT 38.7 40.0 37.3 29.9* 33.7*  MCV 83.4 87.3 83.6 83.1 86.0  PLT 176 126* 147* 117* [id number]*   Basic Metabolic Panel: Recent Labs  Lab 02/15/22 1715 02/15/22 2120 02/16/22 0147 02/17/22 0256 02/17/22 1448 02/18/22 0251  NA 143  --  143 140 141 141  K 3.5  --  3.2* 3.1* 4.8 4.2  CL 110  --  111 112* 113* 114*  CO2 21*  --  18* 21* 23 21*  GLUCOSE 140*  --  116* 112* 96 91  BUN 50*  --  45* 30* 29* 23  CREATININE 2.14* 1.67* 1.70* 0.77 0.79 0.57  CALCIUM 8.2*  --  7.9* 7.5* 8.1* 8.0*  MG  --    --  1.7  --   --  1.9  PHOS  --   --  4.2  --   --   --    GFR: Estimated Creatinine Clearance: 67.4 mL/min (by C-G formula based on SCr of 0.57 mg/dL). Liver Function Tests: Recent Labs  Lab 02/15/22 1715  AST 29  ALT 20  ALKPHOS 74  BILITOT 0.7  PROT 8.2*  ALBUMIN 2.6*   No results for input(s): "LIPASE", "AMYLASE" in the last 168 hours. No results for input(s): "AMMONIA" in the last 168 hours. Coagulation Profile: Recent Labs  Lab 02/15/22 1715  INR 1.6*   Cardiac Enzymes: No results for input(s): "CKTOTAL", "CKMB", "CKMBINDEX", "TROPONINI" in the last 168 hours. BNP (last 3 results) No results for input(s): "PROBNP" in the last 8760 hours. HbA1C: No results for input(s): "HGBA1C" in the last 72 hours. CBG: Recent Labs  Lab 02/15/22 1644  GLUCAP 132*   Lipid Profile: No results for input(s): "CHOL", "HDL", "LDLCALC", "TRIG", "CHOLHDL", "LDLDIRECT" in the last 72 hours. Thyroid Function Tests: No results for input(s): "TSH", "T4TOTAL", "FREET4", "T3FREE", "THYROIDAB" in the last 72 hours. Anemia Panel: No results for input(s): "VITAMINB12", "  FOLATE", "FERRITIN", "TIBC", "IRON", "RETICCTPCT" in the last 72 hours. Sepsis Labs: Recent Labs  Lab 02/15/22 2120 02/16/22 0147 02/16/22 0852 02/16/22 1317 02/17/22 0256 02/17/22 0653  PROCALCITON 1.53 4.60  --   --  5.14  --   LATICACIDVEN 6.9* 5.6* 2.5* 2.7*  --  1.6    Recent Results (from the past 240 hour(s))  Urine Culture     Status: Abnormal   Collection Time: 02/15/22  4:59 PM   Specimen: In/Out Cath Urine  Result Value Ref Range Status   Specimen Description   Final    IN/OUT CATH URINE Performed at [facility], [address], [place], [place] [address]    Special Requests   Final    NONE Performed at [facility], [address] [place], [place] [address]    Culture (A)  Final    >=100,000 COLONIES/mL KLEBSIELLA PNEUMONIAE 40,000 COLONIES/mL PROTEUS  MIRABILIS    Report Status 02/17/2022 FINAL  Final   Organism ID, Bacteria KLEBSIELLA PNEUMONIAE (A)  Final   Organism ID, Bacteria PROTEUS MIRABILIS (A)  Final      Susceptibility   Klebsiella pneumoniae - MIC*    AMPICILLIN RESISTANT Resistant     CEFAZOLIN <=4 SENSITIVE Sensitive     CEFEPIME <=0.12 SENSITIVE Sensitive     CEFTRIAXONE <=0.25 SENSITIVE Sensitive     CIPROFLOXACIN <=0.25 SENSITIVE Sensitive     GENTAMICIN <=1 SENSITIVE Sensitive     IMIPENEM <=0.25 SENSITIVE Sensitive     NITROFURANTOIN 64 INTERMEDIATE Intermediate     TRIMETH/SULFA <=20 SENSITIVE Sensitive     AMPICILLIN/SULBACTAM 4 SENSITIVE Sensitive     PIP/TAZO <=4 SENSITIVE Sensitive     * >=100,000 COLONIES/mL KLEBSIELLA PNEUMONIAE   Proteus mirabilis - MIC*    AMPICILLIN <=2 SENSITIVE Sensitive     CEFAZOLIN <=4 SENSITIVE Sensitive     CEFEPIME <=0.12 SENSITIVE Sensitive     CEFTRIAXONE <=0.25 SENSITIVE Sensitive     CIPROFLOXACIN >=4 RESISTANT Resistant     GENTAMICIN <=1 SENSITIVE Sensitive     IMIPENEM 2 SENSITIVE Sensitive     NITROFURANTOIN 128 RESISTANT Resistant     TRIMETH/SULFA <=20 SENSITIVE Sensitive     AMPICILLIN/SULBACTAM <=2 SENSITIVE Sensitive     PIP/TAZO <=4 SENSITIVE Sensitive     * 40,000 COLONIES/mL PROTEUS MIRABILIS  Blood Culture (routine x 2)     Status: None (Preliminary result)   Collection Time: 02/15/22  5:04 PM   Specimen: BLOOD  Result Value Ref Range Status   Specimen Description   Final    BLOOD LT HAND Performed at [facility], [address] [place], [place] [address]    Special Requests   Final    BOTTLES DRAWN AEROBIC AND ANAEROBIC BCAV Performed at [facility], [address] [place], [place] [address]    Culture   Final    NO GROWTH 3 DAYS Performed at [facility], [address] [place], [place] [address]    Report Status PENDING  Incomplete  Blood Culture (routine x 2)     Status: None (Preliminary  result)   Collection Time: 02/15/22  5:15 PM   Specimen: BLOOD  Result Value Ref Range Status   Specimen Description   Final    BLOOD SITE NOT SPECIFIED Performed at [facility], [address], [place], [place] [address]    Special Requests   Final    BOTTLES DRAWN AEROBIC AND ANAEROBIC Blood Culture  adequate volume Performed at [facility] [address], [place], [place] [address]    Culture   Final    NO GROWTH 3 DAYS Performed at [facility], [place] [address] [place], [place] [address]    Report Status PENDING  Incomplete  Resp Panel by RT-PCR (Flu A&B, Covid) Anterior Nasal Swab     Status: None   Collection Time: 02/15/22  6:12 PM   Specimen: Anterior Nasal Swab  Result Value Ref Range Status   SARS Coronavirus 2 by RT PCR NEGATIVE NEGATIVE Final    Comment: (NOTE) SARS-CoV-2 target nucleic acids are NOT DETECTED.  The SARS-CoV-2 RNA is generally detectable in upper respiratory specimens during the acute phase of infection. The lowest concentration of SARS-CoV-2 viral copies this assay can detect is 138 copies/mL. A negative result does not preclude SARS-Cov-2 infection and should not be used as the sole basis for treatment or other patient management decisions. A negative result may occur with  improper specimen collection/handling, submission of specimen other than nasopharyngeal swab, presence of viral mutation(s) within the areas targeted by this assay, and inadequate number of viral copies(<138 copies/mL). A negative result must be combined with clinical observations, patient history, and epidemiological information. The expected result is Negative.  Fact Sheet for Patients:  EntrepreneurPulse.com.au  Fact Sheet for Healthcare Providers:  IncredibleEmployment.be  This test is no t yet approved or cleared by the [place] FDA and  has been authorized for detection and/or  diagnosis of SARS-CoV-2 by FDA under an Emergency Use Authorization (EUA). This EUA will remain  in effect (meaning this test can be used) for the duration of the COVID-19 declaration under Section 564(b)(1) of the Act, 21 U.S.C.section 360bbb-3(b)(1), unless the authorization is terminated  or revoked sooner.       Influenza A by PCR NEGATIVE NEGATIVE Final   Influenza B by PCR NEGATIVE NEGATIVE Final    Comment: (NOTE) The Xpert Xpress SARS-CoV-2/FLU/RSV plus assay is intended as an aid in the diagnosis of influenza from Nasopharyngeal swab specimens and should not be used as a sole basis for treatment. Nasal washings and aspirates are unacceptable for Xpert Xpress SARS-CoV-2/FLU/RSV testing.  Fact Sheet for Patients: EntrepreneurPulse.com.au  Fact Sheet for Healthcare Providers: IncredibleEmployment.be  This test is not yet approved or cleared by the [place] FDA and has been authorized for detection and/or diagnosis of SARS-CoV-2 by FDA under an Emergency Use Authorization (EUA). This EUA will remain in effect (meaning this test can be used) for the duration of the COVID-19 declaration under Section 564(b)(1) of the Act, 21 U.S.C. section 360bbb-3(b)(1), unless the authorization is terminated or revoked.  Performed at [facility], [place] [address] [place], [place] [address]   MRSA Next Gen by PCR, Nasal     Status: None   Collection Time: 02/15/22  9:40 PM   Specimen: Nasal Mucosa; Nasal Swab  Result Value Ref Range Status   MRSA by PCR Next Gen NOT DETECTED NOT DETECTED Final    Comment: (NOTE) The GeneXpert MRSA Assay (FDA approved for NASAL specimens only), is one component of a comprehensive MRSA colonization surveillance program. It is not intended to diagnose MRSA infection nor to guide or monitor treatment for MRSA infections. Test performance is not FDA approved in patients less than 51  years old. Performed at [facility], [place] [address] [place],  [address]          Radiology Studies: CT ABDOMEN  PELVIS W CONTRAST  Result Date: 02/17/2022 CLINICAL DATA:  Pain left upper quadrant of abdomen EXAM: CT ABDOMEN AND PELVIS WITH CONTRAST TECHNIQUE: Multidetector CT imaging of the abdomen and pelvis was performed using the standard protocol following bolus administration of intravenous contrast. RADIATION DOSE REDUCTION: This exam was performed according to the departmental dose-optimization program which includes automated exposure control, adjustment of the mA and/or kV according to patient size and/or use of iterative reconstruction technique. CONTRAST:  133mL OMNIPAQUE IOHEXOL 300 MG/ML  SOLN COMPARISON:  05/29/2021 FINDINGS: Lower chest: There are patchy infiltrates in the visualized mid and lower lung fields, more so in the right lower lung field. Minimal bilateral pleural effusions are seen. Pacemaker leads are noted in place. Coronary artery calcifications are seen. Hepatobiliary: No focal abnormalities are seen in the liver. Surgical clips are seen in gallbladder fossa. Pancreas: No focal abnormalities are seen pancreas. Spleen: Unremarkable. 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 There is no hydronephrosis. There are no renal or ureteral stones. There is 3.9 cm cyst in the midportion of left kidney. Urinary bladder is not distended. Foley catheter is seen in the bladder. There is mild diffuse wall thickening in the bladder. There is mild enhancement in the wall of right renal pelvis. Stomach/Bowel: Surgical staples are seen in the stomach, possibly suggesting previous bariatric surgery. Small hiatal hernia is seen. Small bowel loops are not dilated. Appendix is not distinctly seen. There is moderate to large amount of stool in the lumen of colon. There is interval decrease in amount of stool in right colon. Colon appears less  distended. Vascular/Lymphatic: Scattered arterial calcifications are seen. Reproductive: There are calcified uterine fibroids. Other: There is no pneumoperitoneum. There is minimal ascites. There is a catheter entering the left upper abdomen with its tip in the course of jejunum suggesting jejunostomy catheter. There is no definite demonstrable thick-walled loculated fluid collection in the abdominal wall. There are small pockets of air adjacent to the proximal course of the jejunostomy, possibly within partly collapsed small bowel loop. Musculoskeletal: There is previous surgical fusion at the L4-L5 level. IMPRESSION: There is no evidence of intestinal obstruction or pneumoperitoneum. There is interval decrease in amount of stool in colon and interval decrease in colonic dilation. There is no hydronephrosis. There is mild enhancement in the wall of the right renal pelvis. There is diffuse wall thickening in the urinary bladder. Possibility of UTI is not excluded. Small patchy infiltrates are seen in lower lung fields suggesting atelectasis/pneumonia. Small bilateral pleural effusions. Coronary artery calcifications are seen. Other findings as described in the body of the report. Electronically Signed   By: Elmer Picker M.D.   On: 02/17/2022 13:15   DG Abd 1 View  Result Date: 02/17/2022 CLINICAL DATA:  Abdominal pain. Pain with palpation. EXAM: ABDOMEN - 1 VIEW COMPARISON:  One-view abdomen 09/06/2021 FINDINGS: Chronic elevation of the right hemidiaphragm noted. Pacing and defibrillator wires are stable. Sigmoid colon is mildly dilated. Bowel gas pattern is otherwise unremarkable. The remainder the colon is within normal limits. Lumbar fusion noted. IMPRESSION: Mild sigmoid distention without obstruction. Question focal ileus. No evidence for obstruction. Electronically Signed   By: San Morelle M.D.   On: 02/17/2022 07:08        Scheduled Meds:  Chlorhexidine Gluconate Cloth  6 each  Topical Q0600   heparin injection (subcutaneous)  5,000 Units Subcutaneous Q8H   levETIRAcetam  1,000 mg Per Tube BID   levothyroxine  75 mcg Per Tube Q0600  pantoprazole sodium  40 mg Per Tube Daily   phenytoin  187.5 mg Per Tube BID   polyethylene glycol  34 g Per Tube BID   sennosides  10 mL Per Tube BID   valproic acid  500 mg Per Tube QID   Continuous Infusions:  sodium chloride Stopped (02/15/22 1927)   amiodarone 30 mg/hr (02/18/22 1900)    ceFAZolin (ANCEF) IV Stopped (02/18/22 1605)   lactated ringers Stopped (02/15/22 2314)     LOS: 3 days    Time spent:    Erick Blinks, MD [facility]   If 7PM-7AM, please contact night-coverage [email or web address]  02/18/2022, 8:05 PM

## 2022-02-19 ENCOUNTER — Other Ambulatory Visit: Payer: Self-pay

## 2022-02-19 DIAGNOSIS — I10 Essential (primary) hypertension: Secondary | ICD-10-CM | POA: Diagnosis not present

## 2022-02-19 DIAGNOSIS — I629 Nontraumatic intracranial hemorrhage, unspecified: Secondary | ICD-10-CM | POA: Diagnosis not present

## 2022-02-19 DIAGNOSIS — I480 Paroxysmal atrial fibrillation: Secondary | ICD-10-CM

## 2022-02-19 DIAGNOSIS — N179 Acute kidney failure, unspecified: Secondary | ICD-10-CM | POA: Diagnosis not present

## 2022-02-19 DIAGNOSIS — A419 Sepsis, unspecified organism: Secondary | ICD-10-CM | POA: Diagnosis not present

## 2022-02-19 DIAGNOSIS — Z978 Presence of other specified devices: Secondary | ICD-10-CM | POA: Diagnosis not present

## 2022-02-19 LAB — GLUCOSE, CAPILLARY
Glucose-Capillary: 112 mg/dL — ABNORMAL HIGH (ref 70–99)
Glucose-Capillary: 137 mg/dL — ABNORMAL HIGH (ref 70–99)
Glucose-Capillary: 143 mg/dL — ABNORMAL HIGH (ref 70–99)

## 2022-02-19 LAB — COMPREHENSIVE METABOLIC PANEL
ALT: 19 U/L (ref 0–44)
AST: 45 U/L — ABNORMAL HIGH (ref 15–41)
Albumin: 2 g/dL — ABNORMAL LOW (ref 3.5–5.0)
Alkaline Phosphatase: 78 U/L (ref 38–126)
Anion gap: 6 (ref 5–15)
BUN: 15 mg/dL (ref 8–23)
CO2: 23 mmol/L (ref 22–32)
Calcium: 8.1 mg/dL — ABNORMAL LOW (ref 8.9–10.3)
Chloride: 113 mmol/L — ABNORMAL HIGH (ref 98–111)
Creatinine, Ser: 0.52 mg/dL (ref 0.44–1.00)
GFR, Estimated: >60 mL/min (ref >60)
Glucose, Bld: 111 mg/dL — ABNORMAL HIGH (ref 70–99)
Potassium: 4.7 mmol/L (ref 3.5–5.1)
Sodium: 142 mmol/L (ref 135–145)
Total Bilirubin: 0.6 mg/dL (ref 0.3–1.2)
Total Protein: 7 g/dL (ref 6.5–8.1)

## 2022-02-19 LAB — CBC
HCT: 31 % — ABNORMAL LOW (ref 36.0–46.0)
Hemoglobin: 10.2 g/dL — ABNORMAL LOW (ref 12.0–15.0)
MCH: 25.8 pg — ABNORMAL LOW (ref 26.0–34.0)
MCHC: 32.9 g/dL (ref 30.0–36.0)
MCV: 78.5 fL — ABNORMAL LOW (ref 80.0–100.0)
Platelets: 115 10*3/uL — ABNORMAL LOW (ref 150–400)
RBC: 3.95 MIL/uL (ref 3.87–5.11)
RDW: 18.3 % — ABNORMAL HIGH (ref 11.5–15.5)
WBC: 11.2 10*3/uL — ABNORMAL HIGH (ref 4.0–10.5)
nRBC: 0 % (ref 0.0–0.2)

## 2022-02-19 LAB — MAGNESIUM
Magnesium: 1.8 mg/dL (ref 1.7–2.4)
Magnesium: 1.7 mg/dL (ref 1.7–2.4)

## 2022-02-19 LAB — PHOSPHORUS
Phosphorus: 2.4 mg/dL — ABNORMAL LOW (ref 2.5–4.6)
Phosphorus: 2.2 mg/dL — ABNORMAL LOW (ref 2.5–4.6)

## 2022-02-19 LAB — LIPASE, BLOOD: Lipase: 22 U/L (ref 11–51)

## 2022-02-19 MED ORDER — SODIUM PHOSPHATES 45 MMOLE/15ML IV SOLN
15.0000 mmol | Freq: Once | INTRAVENOUS | Status: AC
  Administered 2022-02-19: 15 mmol via INTRAVENOUS
  Filled 2022-02-19: qty 5

## 2022-02-19 MED ORDER — AMIODARONE HCL 200 MG PO TABS: 200.0000 mg | ORAL_TABLET | Freq: Every day | ORAL | Status: DC

## 2022-02-19 MED ORDER — ORAL CARE MOUTH RINSE
15.0000 mL | OROMUCOSAL | Status: DC
  Administered 2022-02-19 – 2022-02-22 (×12): 15 mL via OROMUCOSAL

## 2022-02-19 MED ORDER — AMIODARONE HCL 200 MG PO TABS
200.0000 mg | ORAL_TABLET | Freq: Two times a day (BID) | ORAL | Status: DC
Start: 2022-02-19 — End: 2022-02-22
  Administered 2022-02-19 – 2022-02-22 (×7): 200 mg
  Filled 2022-02-19 (×7): qty 1

## 2022-02-19 MED ORDER — PROSOURCE TF PO LIQD
45.0000 mL | Freq: Three times a day (TID) | ORAL | Status: DC
  Administered 2022-02-19 – 2022-02-22 (×9): 45 mL
  Filled 2022-02-19 (×11): qty 45

## 2022-02-19 MED ORDER — ZINC SULFATE 220 (50 ZN) MG PO CAPS
220.0000 mg | ORAL_CAPSULE | Freq: Every day | ORAL | Status: DC
Start: 2022-02-19 — End: 2022-02-22
  Administered 2022-02-19 – 2022-02-22 (×4): 220 mg
  Filled 2022-02-19 (×4): qty 1

## 2022-02-19 MED ORDER — FREE WATER
100.0000 mL | Status: DC
  Administered 2022-02-19 – 2022-02-22 (×19): 100 mL

## 2022-02-19 MED ORDER — MORPHINE SULFATE (PF) 2 MG/ML IV SOLN
2.0000 mg | INTRAVENOUS | Status: DC | PRN
  Administered 2022-02-20 – 2022-02-21 (×3): 2 mg via INTRAVENOUS
  Filled 2022-02-19 (×4): qty 1

## 2022-02-19 MED ORDER — ASCORBIC ACID 500 MG PO TABS
500.0000 mg | ORAL_TABLET | Freq: Two times a day (BID) | ORAL | Status: DC
  Administered 2022-02-19 – 2022-02-22 (×7): 500 mg
  Filled 2022-02-19 (×7): qty 1

## 2022-02-19 MED ORDER — ORAL CARE MOUTH RINSE: 15.0000 mL | OROMUCOSAL | Status: DC | PRN

## 2022-02-19 MED ORDER — VITAL HIGH PROTEIN PO LIQD: 1000.0000 mL | ORAL | Status: DC

## 2022-02-19 MED ORDER — JEVITY 1.2 CAL PO LIQD
1000.0000 mL | ORAL | Status: DC
Start: 2022-02-19 — End: 2022-02-22
  Administered 2022-02-19 – 2022-02-21 (×3): 1000 mL
  Filled 2022-02-19 (×2): qty 1000

## 2022-02-19 NOTE — Progress Notes (Addendum)
Initial Nutrition Assessment  DOCUMENTATION CODES:   Not applicable  INTERVENTION:  - will order Jevity 1.2 @ 25 ml/hr to advance by 10 ml every 8 hours to reach goal rate of 65 ml/hr with 45 ml Prosource TF TID and 100 ml free water every 4 hours.  - slower advancement to allow for adjustment to fiber provision.   - at goal rate, this regimen will provide 1992 kcal (98% kcal need), 119 grams protein, 26 grams fiber, and 1859 ml free water.   - will order Juven BID via G-tube once sepsis etiology further resolved.  - will check serum vitamin D on 7/26 AM--last checked in 2011 and was 14 (reference range: 30-100) at that time.  - will order 220 mg zinc sulfate/day x14 days and 500 mg ascorbic acid BID via G-tube to aid in wound healing (cleared by Attending).   NUTRITION DIAGNOSIS:   Increased nutrient needs related to acute illness, wound healing as evidenced by estimated needs.  GOAL:   Patient will meet greater than or equal to 90% of their needs  MONITOR:   TF tolerance, Diet advancement, Labs, Weight trends, Skin  REASON FOR ASSESSMENT:   Consult Enteral/tube feeding initiation and management  ASSESSMENT:   71 year old female who is a resident at a SNF with medical history of chronic Foley catheter, sacral decubitus ulcer, PEG tube for poor p.o. intake, stroke with R-sided hemiplegia and expressive aphasia, HTN, arthritis, DM, seizures, CKD, asthma, HLD, vitamin B deficiency, insomnia, and GERD. She was admitted due to sepsis and septic shock 2/2 UTI. Once hospitalized, she developed afib with RVR.  Patient laying in bed. Flow sheet documentation indicates patient is disoriented x4. Able to talk with RN at bedside. She reports that patient's feeding tube has one port and is a low profile (Mickey button-style) tube.   She has been NPO since admission. SLP evaluated patient yesterday and recommended NPO status.   Unable to locate information in the electronic or paper  chart about previous TF regimen.   Able to talk with patient's daughter Elzie Rings, who is HCPOA, on the phone. She shares that patient previously had NGT during a prior hospitalization and then had G-tube placed. During hospitalization and at SNF patient was receiving TF formula that was in a clear bottle with a purple top (unfortunately, nearly all Abbott tube feeding formulas match this description). Elzie Rings shares that her mom was able to regain strength and transition off of tube feeding and meet needs with oral intake.   Weight today is 179 lb and weight has been fluctuating daily since 7/21 at which time weight was 172 lb. Noted BLE edema during NFPE. PTA the most recently documented weight was 172 lb on 05/29/21.   Labs reviewed; CBGs not being checked, Cl: 113 mmol/l, Ca: 8.1 mg/dl. Medications reviewed; 75 mcg synthroid/day per G-tube, 40 mg protonix/day per G-tube, 187.5 mg dilantin BID per G-tube, 34 g miralax BID, 10 ml senokot BID.    NUTRITION - FOCUSED PHYSICAL EXAM:  Flowsheet Row Most Recent Value  Orbital Region No depletion  Upper Arm Region No depletion  Thoracic and Lumbar Region No depletion  Buccal Region No depletion  Temple Region No depletion  Clavicle Bone Region No depletion  Clavicle and Acromion Bone Region No depletion  Scapular Bone Region Unable to assess  [due to positioning]  Dorsal Hand No depletion  Patellar Region No depletion  Anterior Thigh Region No depletion  Posterior Calf Region No depletion  Edema (RD Assessment)  Moderate  [BLE]  Hair Reviewed  Eyes Reviewed  Mouth Unable to assess  [patient did not follow cueing for opening mouth for assessment]  Skin Reviewed  Nails Unable to assess  [nail polish]       Diet Order:   Diet Order             Diet NPO time specified  Diet effective now                   EDUCATION NEEDS:   No education needs have been identified at this time  Skin:  Skin Assessment: Skin Integrity  Issues: Skin Integrity Issues:: Stage IV Stage IV: sacrum  Last BM:  7/24 (type 6 x1, large amount)  Height:   Ht Readings from Last 1 Encounters:  02/15/22 5\' 5"  (1.651 m)    Weight:   Wt Readings from Last 1 Encounters:  02/19/22 81.1 kg     BMI:  Body mass index is 29.75 kg/m.  Estimated Nutritional Needs:  Kcal:  2030-2250 kcal Protein:  100-120 grams Fluid:  >/= 2.2 L/day     12-11-2001, MS, RD, LDN, CNSC Registered Dietitian II Inpatient Clinical Nutrition RD pager # and on-call/weekend pager # available in [facility]

## 2022-02-19 NOTE — Progress Notes (Signed)
Speech Language Pathology Treatment:    Patient Details Name: Kathleen Jensen MRN: [id number] DOB: 03-31-1951 Today's Date: 02/19/2022 Time: 5301-0404 SLP Time Calculation (min) (ACUTE ONLY): 20 min  Assessment / Plan / Recommendation Clinical Impression  Patient seen by SLP for skilled treatment focused on dysphagia goals. Patient was awake and alert but did start to become drowsy at end of session. She was receptive to having ice chips and straw sips of water. With small, single ice chips, she exhibited immediate mastication and fairly timely swallow initiation. With water sips she initially had difficulty forming lips around straw but quickly progressed to being able to take sips through straw without difficulty and with timely swallow initiation. No overt s/s aspiration or penetration observed. SLP is recommending continue NPO but allow patient to have PRN ice chips and/or water sips. SLP will follow up next date for further PO trials.   HPI HPI: Patient is a 71 y.o. female with PMH: CVA with residual aphasia, chronic sacral wound, DM, seizures, CKD, GERD, HTN, HLD. She is a SNF resident and presented to the hospital on 02/15/22 with AMS. In ED, she had ongoing hypotension, Tmax of 100.2, UA concerning for UTI, AKI. She had PEG 09/17/2021. CT head negative for acute intracranial abnormality. Chest x-ray without pneumothorax, effusion, infiltrate.      SLP Plan  Continue with current plan of care      Recommendations for follow up therapy are one component of a multi-disciplinary discharge planning process, led by the attending physician.  Recommendations may be updated based on patient status, additional functional criteria and insurance authorization.    Recommendations  Diet recommendations: NPO;Other(comment) (ice/water PRN) Medication Administration: Via alternative means                Oral Care Recommendations: Oral care BID;Staff/trained caregiver to provide oral  care Follow Up Recommendations: Skilled nursing-short term rehab (<3 hours/day) Assistance recommended at discharge: Frequent or constant Supervision/Assistance SLP Visit Diagnosis: Dysphagia, unspecified (R13.10) Plan: Continue with current plan of care          Angela Nevin, MA, CCC-SLP Speech Therapy

## 2022-02-19 NOTE — Progress Notes (Addendum)
PROGRESS NOTE    Kathleen Jensen  IWL:[id number] DOB: Jul 27, 1951 DOA: 02/15/2022 PCP: Patient, No Pcp Per    Brief Narrative:  71 year old female who is a resident of a skilled nursing facility, chronic Foley catheter, sacral decubitus ulcer, PEG tube for poor p.o. 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  She was admitted to the ICU with septic shock and briefly required peripheral pressors.  She has been on antibiotics.  She also developed rapid atrial fibrillation started on amiodarone infusion.  Clinically she is stabilized and was transferred to [facility] service on 7/24   Assessment & Plan:   Principal Problem:   Septic shock [facility]) Active Problems:   Hypothyroidism   Essential hypertension   History of cardiac pacemaker in situ   Hyperlipidemia   Seizures (HCC)   Right hemiplegia (HCC)   UTI (urinary tract infection)   Pressure injury of skin   Chronic indwelling Foley catheter   New onset a-fib (HCC)   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HCC)   Acute septic shock, present on admission Secondary to UTI -Treated with broad-spectrum antibiotics (vancomycin and cefepime) -Urine culture positive for Klebsiella and Proteus -Based on culture sensitivities, antibiotics have been changed to Ancef -She did require peripheral pressors, but have since been weaned off -CT abdomen unrevealing  Urinary tract infection Chronic indwelling Foley catheter -Family reports catheter has been placed since February 2023 -Urine culture with Klebsiella and Proteus -Continue antibiotics  Acute kidney injury -Creatinine of 2.1 on admission -Improved with IV fluids, now 0.5  Rapid atrial fibrillation -New onset -CHA2DS2-VASc of 5 -Likely being driven by sepsis -Heart rates up to 170 earlier in hospital stay and she was started on amiodarone infusion -Heart  rates are better now -She was reportedly going to restart low dose oral metoprolol as an outpatient, per cardiology notes in Care everywhere -metoprolol 12.5mg  bid restarted 7/24 -cardiology consulted  -Discussed anticoagulation with patient's daughter and she relates that she was told by patient's neurologist that she would be high risk for intracerebral bleeding.  She does have prior history of intracerebral hemorrhage -May be reasonable for patient to follow-up with her neurologist to discuss further anticoagulation  Sick sinus syndrome status post pacemaker -Daughter reports that battery for pacemaker was recently changed  Seizure disorder -Continue on Keppra, Dilantin, valproic acid  PEG tube dependent -Daughter reports that PEG tube was placed in February 2023 due to failure to thrive and poor p.o. intake -She said that SNF staff has largely stopped using PEG tube since she is now been eating by mouth -Seen by SLP with recommendations to continue n.p.o. due to cognitive issues  Hyperlipidemia -Continue statin  Hypothyroidism -Continue Synthroid  Pressure injury, sacral, stage IV, present on admission -Appreciate WOC recommendations for dressing changes  Prior stroke/ICH -Residual expressive aphasia and right hemiplegia -She is a chronic resident of [name] since 2020  Goals of care -Reviewed patient's wishes with her daughter -Cherece relates that patient had expressed that she would want all available treatments including CPR and ventilation -In the event that it is determined that she would need these interventions long-term, she would not wish to continue on life-sustaining measures.  DVT prophylaxis: heparin injection 5,000 Units Start: 02/17/22 2200  Code Status: Full code Family Communication: Discussed with patient's daughter Elzie Rings over the phone Disposition Plan: Status is: Inpatient Remains inpatient appropriate because: Continued management of  sepsis and rapid  atrial fibrillation     Consultants:  PCCM  Procedures:    Antimicrobials:  Cefepime 7/21 > 7/24 Cefazolin 7/24 >   Subjective: Speech difficult to comprehend due to aphasia  Objective: Vitals:   02/19/22 0800 02/19/22 0900 02/19/22 0957 02/19/22 1000  BP: (!) 157/84 (!) 143/77 (!) 143/77 (!) 153/74  Pulse: 60  60   Resp: 20 19  (!) 24  Temp:      TempSrc:      SpO2: 97%     Weight:      Height:        Intake/Output Summary (Last 24 hours) at 02/19/2022 1007 Last data filed at 02/19/2022 1003 Gross per 24 hour  Intake 1650.91 ml  Output 2350 ml  Net -699.09 ml   Filed Weights   02/16/22 0442 02/18/22 0600 02/19/22 0500  Weight: 83.2 kg 80.1 kg 81.1 kg    Examination:  General exam: Appears calm and comfortable  Respiratory system: Clear to auscultation. Respiratory effort normal. Cardiovascular system: S1 & S2 heard, irregular. No JVD, murmurs, rubs, gallops or clicks.  Gastrointestinal system: Abdomen is nondistended, soft and diffusely tender. No organomegaly or masses felt. Normal bowel sounds heard. PEG tube in place Central nervous system: She is awake, right hemiparesis, expressive apasia Extremities: No edema bilaterally Skin: Sacral decubitus ulcer Psychiatry: Speech is slow, with expressive aphasia    Data Reviewed: I have personally reviewed following labs and imaging studies  CBC: Recent Labs  Lab 02/15/22 1715 02/15/22 2120 02/16/22 0147 02/17/22 0256 02/18/22 0251 02/19/22 0257  WBC 16.8* 13.1* 16.3* 14.7* 14.5* 11.2*  NEUTROABS 12.8*  --   --   --   --   --   HGB 12.1 12.0 11.5* 9.3* 10.3* 10.2*  HCT 38.7 40.0 37.3 29.9* 33.7* 31.0*  MCV 83.4 87.3 83.6 83.1 86.0 78.5*  PLT 176 126* 147* 117* 120* [id number]*   Basic Metabolic Panel: Recent Labs  Lab 02/16/22 0147 02/17/22 0256 02/17/22 1448 02/18/22 0251 02/19/22 0257  NA 143 140 141 141 142  K 3.2* 3.1* 4.8 4.2 4.7  CL 111 112* 113* 114* 113*  CO2 18* 21* 23  21* 23  GLUCOSE 116* 112* 96 91 111*  BUN 45* 30* 29* 23 15  CREATININE 1.70* 0.77 0.79 0.57 0.52  CALCIUM 7.9* 7.5* 8.1* 8.0* 8.1*  MG 1.7  --   --  1.9  --   PHOS 4.2  --   --   --   --    GFR: Estimated Creatinine Clearance: 67.8 mL/min (by C-G formula based on SCr of 0.52 mg/dL). Liver Function Tests: Recent Labs  Lab 02/15/22 1715 02/19/22 0257  AST 29 45*  ALT 20 19  ALKPHOS 74 78  BILITOT 0.7 0.6  PROT 8.2* 7.0  ALBUMIN 2.6* 2.0*   No results for input(s): "LIPASE", "AMYLASE" in the last 168 hours. No results for input(s): "AMMONIA" in the last 168 hours. Coagulation Profile: Recent Labs  Lab 02/15/22 1715  INR 1.6*   Cardiac Enzymes: No results for input(s): "CKTOTAL", "CKMB", "CKMBINDEX", "TROPONINI" in the last 168 hours. BNP (last 3 results) No results for input(s): "PROBNP" in the last 8760 hours. HbA1C: No results for input(s): "HGBA1C" in the last 72 hours. CBG: Recent Labs  Lab 02/15/22 1644  GLUCAP 132*   Lipid Profile: No results for input(s): "CHOL", "HDL", "LDLCALC", "TRIG", "CHOLHDL", "LDLDIRECT" in the last 72 hours. Thyroid Function Tests: No results for input(s): "TSH", "T4TOTAL", "FREET4", "T3FREE", "THYROIDAB" in the last  72 hours. Anemia Panel: No results for input(s): "VITAMINB12", "FOLATE", "FERRITIN", "TIBC", "IRON", "RETICCTPCT" in the last 72 hours. Sepsis Labs: Recent Labs  Lab 02/15/22 2120 02/16/22 0147 02/16/22 0852 02/16/22 1317 02/17/22 0256 02/17/22 0653  PROCALCITON 1.53 4.60  --   --  5.14  --   LATICACIDVEN 6.9* 5.6* 2.5* 2.7*  --  1.6    Recent Results (from the past 240 hour(s))  Urine Culture     Status: Abnormal   Collection Time: 02/15/22  4:59 PM   Specimen: In/Out Cath Urine  Result Value Ref Range Status   Specimen Description   Final    IN/OUT CATH URINE Performed at [facility], [place] [address] [place], [place] [address]    Special Requests   Final    NONE Performed at [facility] [place] [address] [place], [place] [address]    Culture (A)  Final    >=100,000 COLONIES/mL KLEBSIELLA PNEUMONIAE 40,000 COLONIES/mL PROTEUS MIRABILIS    Report Status 02/17/2022 FINAL  Final   Organism ID, Bacteria KLEBSIELLA PNEUMONIAE (A)  Final   Organism ID, Bacteria PROTEUS MIRABILIS (A)  Final      Susceptibility   Klebsiella pneumoniae - MIC*    AMPICILLIN RESISTANT Resistant     CEFAZOLIN <=4 SENSITIVE Sensitive     CEFEPIME <=0.12 SENSITIVE Sensitive     CEFTRIAXONE <=0.25 SENSITIVE Sensitive     CIPROFLOXACIN <=0.25 SENSITIVE Sensitive     GENTAMICIN <=1 SENSITIVE Sensitive     IMIPENEM <=0.25 SENSITIVE Sensitive     NITROFURANTOIN 64 INTERMEDIATE Intermediate     TRIMETH/SULFA <=20 SENSITIVE Sensitive     AMPICILLIN/SULBACTAM 4 SENSITIVE Sensitive     PIP/TAZO <=4 SENSITIVE Sensitive     * >=100,000 COLONIES/mL KLEBSIELLA PNEUMONIAE   Proteus mirabilis - MIC*    AMPICILLIN <=2 SENSITIVE Sensitive     CEFAZOLIN <=4 SENSITIVE Sensitive     CEFEPIME <=0.12 SENSITIVE Sensitive     CEFTRIAXONE <=0.25 SENSITIVE Sensitive     CIPROFLOXACIN >=4 RESISTANT Resistant     GENTAMICIN <=1 SENSITIVE Sensitive     IMIPENEM 2 SENSITIVE Sensitive     NITROFURANTOIN 128 RESISTANT Resistant     TRIMETH/SULFA <=20 SENSITIVE Sensitive     AMPICILLIN/SULBACTAM <=2 SENSITIVE Sensitive     PIP/TAZO <=4 SENSITIVE Sensitive     * 40,000 COLONIES/mL PROTEUS MIRABILIS  Blood Culture (routine x 2)     Status: None (Preliminary result)   Collection Time: 02/15/22  5:04 PM   Specimen: BLOOD  Result Value Ref Range Status   Specimen Description   Final    BLOOD LT HAND Performed at [facility], [place] [address] [place], [place] [address]    Special Requests   Final    BOTTLES DRAWN AEROBIC AND ANAEROBIC BCAV Performed at [facility], [place] [address], [place], [place] [address]    Culture   Final    NO GROWTH 4 DAYS Performed  at [facility], [place] [address], [place], [place] [address]    Report Status PENDING  Incomplete  Blood Culture (routine x 2)     Status: None (Preliminary result)   Collection Time: 02/15/22  5:15 PM   Specimen: BLOOD  Result Value Ref Range Status   Specimen Description   Final    BLOOD SITE NOT SPECIFIED Performed at [place] [address], [place], [place] [address]    Special Requests   Final  BOTTLES DRAWN AEROBIC AND ANAEROBIC Blood Culture adequate volume Performed at [place] [address], [place], [place] [address]    Culture   Final    NO GROWTH 4 DAYS Performed at [facility], [place] [address], [place], [place] [address]    Report Status PENDING  Incomplete  Resp Panel by RT-PCR (Flu A&B, Covid) Anterior Nasal Swab     Status: None   Collection Time: 02/15/22  6:12 PM   Specimen: Anterior Nasal Swab  Result Value Ref Range Status   SARS Coronavirus 2 by RT PCR NEGATIVE NEGATIVE Final    Comment: (NOTE) SARS-CoV-2 target nucleic acids are NOT DETECTED.  The SARS-CoV-2 RNA is generally detectable in upper respiratory specimens during the acute phase of infection. The lowest concentration of SARS-CoV-2 viral copies this assay can detect is 138 copies/mL. A negative result does not preclude SARS-Cov-2 infection and should not be used as the sole basis for treatment or other patient management decisions. A negative result may occur with  improper specimen collection/handling, submission of specimen other than nasopharyngeal swab, presence of viral mutation(s) within the areas targeted by this assay, and inadequate number of viral copies(<138 copies/mL). A negative result must be combined with clinical observations, patient history, and epidemiological information. The expected result is Negative.  Fact Sheet for Patients:  EntrepreneurPulse.com.au  Fact Sheet for Healthcare Providers:   IncredibleEmployment.be  This test is no t yet approved or cleared by the [place] FDA and  has been authorized for detection and/or diagnosis of SARS-CoV-2 by FDA under an Emergency Use Authorization (EUA). This EUA will remain  in effect (meaning this test can be used) for the duration of the COVID-19 declaration under Section 564(b)(1) of the Act, 21 U.S.C.section 360bbb-3(b)(1), unless the authorization is terminated  or revoked sooner.       Influenza A by PCR NEGATIVE NEGATIVE Final   Influenza B by PCR NEGATIVE NEGATIVE Final    Comment: (NOTE) The Xpert Xpress SARS-CoV-2/FLU/RSV plus assay is intended as an aid in the diagnosis of influenza from Nasopharyngeal swab specimens and should not be used as a sole basis for treatment. Nasal washings and aspirates are unacceptable for Xpert Xpress SARS-CoV-2/FLU/RSV testing.  Fact Sheet for Patients: EntrepreneurPulse.com.au  Fact Sheet for Healthcare Providers: IncredibleEmployment.be  This test is not yet approved or cleared by the [place] FDA and has been authorized for detection and/or diagnosis of SARS-CoV-2 by FDA under an Emergency Use Authorization (EUA). This EUA will remain in effect (meaning this test can be used) for the duration of the COVID-19 declaration under Section 564(b)(1) of the Act, 21 U.S.C. section 360bbb-3(b)(1), unless the authorization is terminated or revoked.  Performed at [facility], [place] [address] [place], [place] [address]   MRSA Next Gen by PCR, Nasal     Status: None   Collection Time: 02/15/22  9:40 PM   Specimen: Nasal Mucosa; Nasal Swab  Result Value Ref Range Status   MRSA by PCR Next Gen NOT DETECTED NOT DETECTED Final    Comment: (NOTE) The GeneXpert MRSA Assay (FDA approved for NASAL specimens only), is one component of a comprehensive MRSA colonization surveillance program. It is not intended  to diagnose MRSA infection nor to guide or monitor treatment for MRSA infections. Test performance is not FDA approved in patients less than 77 years old. Performed at [facility], [place] [address] [place], [place] [address]  Radiology Studies: CT ABDOMEN PELVIS W CONTRAST  Result Date: 02/17/2022 CLINICAL DATA:  Pain left upper quadrant of abdomen EXAM: CT ABDOMEN AND PELVIS WITH CONTRAST TECHNIQUE: Multidetector CT imaging of the abdomen and pelvis was performed using the standard protocol following bolus administration of intravenous contrast. RADIATION DOSE REDUCTION: This exam was performed according to the departmental dose-optimization program which includes automated exposure control, adjustment of the mA and/or kV according to patient size and/or use of iterative reconstruction technique. CONTRAST:  163mL OMNIPAQUE IOHEXOL 300 MG/ML  SOLN COMPARISON:  05/29/2021 FINDINGS: Lower chest: There are patchy infiltrates in the visualized mid and lower lung fields, more so in the right lower lung field. Minimal bilateral pleural effusions are seen. Pacemaker leads are noted in place. Coronary artery calcifications are seen. Hepatobiliary: No focal abnormalities are seen in the liver. Surgical clips are seen in gallbladder fossa. Pancreas: No focal abnormalities are seen pancreas. Spleen: Unremarkable. 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 There is no hydronephrosis. There are no renal or ureteral stones. There is 3.9 cm cyst in the midportion of left kidney. Urinary bladder is not distended. Foley catheter is seen in the bladder. There is mild diffuse wall thickening in the bladder. There is mild enhancement in the wall of right renal pelvis. Stomach/Bowel: Surgical staples are seen in the stomach, possibly suggesting previous bariatric surgery. Small hiatal hernia is seen. Small bowel loops are not dilated. Appendix is not distinctly seen. There  is moderate to large amount of stool in the lumen of colon. There is interval decrease in amount of stool in right colon. Colon appears less distended. Vascular/Lymphatic: Scattered arterial calcifications are seen. Reproductive: There are calcified uterine fibroids. Other: There is no pneumoperitoneum. There is minimal ascites. There is a catheter entering the left upper abdomen with its tip in the course of jejunum suggesting jejunostomy catheter. There is no definite demonstrable thick-walled loculated fluid collection in the abdominal wall. There are small pockets of air adjacent to the proximal course of the jejunostomy, possibly within partly collapsed small bowel loop. Musculoskeletal: There is previous surgical fusion at the L4-L5 level. IMPRESSION: There is no evidence of intestinal obstruction or pneumoperitoneum. There is interval decrease in amount of stool in colon and interval decrease in colonic dilation. There is no hydronephrosis. There is mild enhancement in the wall of the right renal pelvis. There is diffuse wall thickening in the urinary bladder. Possibility of UTI is not excluded. Small patchy infiltrates are seen in lower lung fields suggesting atelectasis/pneumonia. Small bilateral pleural effusions. Coronary artery calcifications are seen. Other findings as described in the body of the report. Electronically Signed   By: Elmer Picker M.D.   On: 02/17/2022 13:15        Scheduled Meds:  Chlorhexidine Gluconate Cloth  6 each Topical Q0600   heparin injection (subcutaneous)  5,000 Units Subcutaneous Q8H   levETIRAcetam  1,000 mg Per Tube BID   levothyroxine  75 mcg Per Tube Q0600   metoprolol tartrate  12.5 mg Per Tube BID   mouth rinse  15 mL Mouth Rinse 4 times per day   pantoprazole sodium  40 mg Per Tube Daily   phenytoin  187.5 mg Per Tube BID   polyethylene glycol  34 g Per Tube BID   sennosides  10 mL Per Tube BID   valproic acid  500 mg Per Tube QID   Continuous  Infusions:  sodium chloride Stopped (02/15/22 1927)   amiodarone 30 mg/hr (02/19/22 0700)  ceFAZolin (ANCEF) IV Stopped (02/19/22 1884)   lactated ringers Stopped (02/15/22 2314)     LOS: 4 days    Time spent:    Erick Blinks, MD [facility]   If 7PM-7AM, please contact night-coverage [email or web address]  02/19/2022, 10:07 AM

## 2022-02-19 NOTE — Consult Note (Addendum)
Cardiology Consultation:   [name] MRN: [id number]; DOB: November 20, 1950  Admit date: 02/15/2022 Date of Consult: 02/19/2022  PCP:  Patient, No Pcp Per   [facility] Providers Cardiologist:  [facility], Dr. Oval Linsey with [facility]      Patient Profile:   Kathleen Jensen is a 71 y.o. female with a hx of HTN, HLD, SSS s/p dual chamber medtronic PPM, DM, PVCs, GERD, hypothyroidism,and expressive aphasia following CVA who is being seen 02/19/2022 for the evaluation of Afib RVR at the request of Dr. Roderic Palau.  History of Present Illness:   Ms. Schlosser follows with cardiology at [facility] She has a medtronic dual chamber PPM in place since 2010 for SSS. She has a history of stroke secondary to intracranial hemorrhage 05/2017. She has cerebral amyloid angiopathy. Following this event, she has right sided weakness, receptive aphasia and seizure disorder.  that resulted in expressive aphasia and right sided hyperplasia.  Of note, she has persistent Afib and was previously anticoagulated with coumadin, but this was stopped due to increased fall risk and hx of intracranial bleeding.  She is confined to a wheelchair and has a PEG in place.   Echo 05/2021 with LVEF 55-60%, mild MR, normal left atrial size.   She underwent a PPM gen change 10/30/21 with [facility].   She has a history of PVCs and was previously on metoprolol. Off of BB, no significant change in PVC burden - about 28 per hr. Pt was asymptomatic at last clinic visit 02/06/22.   She has a history of recurrent hospitalizations with septic shock from suspected urinary source.  She was brought to [facility] 02/15/22 from SNF with AMS. She was hypotensive and UA concerning for UTI. Code sepsis was initiated and she was started on peripheral pressors. IV ABX initiated.  She had leukocytosis, lactic acidosis, and AKI with sCr 2.14 (baseline 0.6-0.8). She developed Afib with RVR and amiodarone was started. Cardiology was consulted.    During my exam, no family at bedside. Level 5 caveat.    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 Authorizing Provider  acetaminophen (TYLENOL) 500 MG tablet Take 1,000 mg by mouth every 8 (eight) hours.   Yes [provider]  docusate (COLACE) 50 MG/5ML liquid Take 100 mg by mouth 2 (  two) times daily.   Yes [provider]  folic acid (FOLVITE) 400 MCG tablet Take 400 mcg by mouth daily.   Yes [provider]  levETIRAcetam (KEPPRA) 1000 MG tablet Take 2 tablets (2,000 mg total) by mouth 2 (two) times daily. Patient taking differently: Place 1,000 mg into feeding tube 2 (two) times daily. 04/14/20  Yes Rai, Ripudeep K, MD  levothyroxine (SYNTHROID) 75 MCG tablet Take 75 mcg by mouth daily before breakfast.   Yes [provider]  Lidocaine 4 % PTCH Apply 1 patch topically daily.   Yes [provider]  methocarbamol (ROBAXIN) 500 MG tablet Take 500 mg by mouth 3  (three) times daily. 02/05/22  Yes [provider]  ondansetron (ZOFRAN-ODT) 4 MG disintegrating tablet Take 4 mg by mouth every 8 (eight) hours as needed for nausea or vomiting. 05/24/21  Yes [provider]  pantoprazole (PROTONIX) 40 MG tablet Take 40 mg by mouth daily. 02/12/22  Yes [provider]  phenytoin (DILANTIN) 125 MG/5ML suspension Take 6 mLs (150 mg total) by mouth 2 (two) times daily. Patient taking differently: Take 187.5 mg by mouth 2 (two) times daily. 7.5 mls BID 06/06/21  Yes Dahal, Melina Schools, MD  Pollen Extracts (PROSTAT PO) Take 30 mLs by mouth in the morning, at noon, and at bedtime.   Yes [provider]  polyethylene glycol (MIRALAX / GLYCOLAX) 17 g packet Take 34 g by mouth 2 (two) times daily. Patient taking differently: Take 34 g by mouth daily. 06/06/21  Yes Dahal, Melina Schools, MD  pregabalin (LYRICA) 75 MG capsule Take 75 mg by mouth 3 (three) times daily.   Yes [provider]  Probiotic Product (PROBIOTIC PO) 1 capsule by Per J Tube route daily.   Yes [provider]  senna-docusate (SENOKOT-S) 8.6-50 MG tablet Take 2 tablets by mouth 2 (two) times daily.   Yes [provider]  Simethicone 80 MG TABS Take 1 tablet by mouth in the morning and at bedtime.   Yes [provider]  simvastatin (ZOCOR) 10 MG tablet Take 10 mg by mouth at bedtime.   Yes [provider]  valproic acid (DEPAKENE) 250 MG/5ML solution Take 500 mg by mouth 4 (four) times daily.   Yes [provider]  bisacodyl (DULCOLAX) 5 MG EC tablet Take 1 tablet (5 mg total) by mouth in the morning and at bedtime. Patient not taking: Reported on 02/15/2022 06/06/21   Lorin Glass, MD  phenytoin (DILANTIN) 125 MG/5ML suspension Take 4 mLs (100 mg total) by mouth daily. Patient not taking: Reported on 02/15/2022 06/06/21   Lorin Glass, MD    Inpatient Medications: Scheduled Meds:  Chlorhexidine Gluconate Cloth  6 each Topical  Q0600   heparin injection (subcutaneous)  5,000 Units Subcutaneous Q8H   levETIRAcetam  1,000 mg Per Tube BID   levothyroxine  75 mcg Per Tube Q0600   metoprolol tartrate  12.5 mg Per Tube BID   mouth rinse  15 mL Mouth Rinse 4 times per day   pantoprazole sodium  40 mg Per Tube Daily   phenytoin  187.5 mg Per Tube BID   polyethylene glycol  34 g Per Tube BID   sennosides  10 mL Per Tube BID   valproic acid  500 mg Per Tube QID   Continuous Infusions:  sodium chloride Stopped (02/15/22 1927)   amiodarone 30 mg/hr (02/19/22 0700)    ceFAZolin (ANCEF) IV Stopped (02/19/22 4235)   lactated ringers Stopped (02/15/22 2314)  PRN Meds: lip balm, mouth rinse, prochlorperazine  Allergies:    Allergies  Allergen Reactions   Tape Rash   Meperidine Hcl Nausea And Vomiting   Sulfonamide Derivatives Itching   Bacitracin-Polymyxin B Dermatitis    "Cloth Band-Aid only"   Oxycodone Itching    Social History:   Social History   Socioeconomic History   Marital status: Divorced    Spouse name: Not on file   Number of children: 3   Years of education: 66yrs   Highest education level: Not on file  Occupational History   Occupation: Retired    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   Caffeine Use: 1 cup daily   1 dog   Social Determinants of Health   Financial Resource Strain: Not on file  Food Insecurity: Not on file  Transportation Needs: Not on file  Physical Activity: Not on file  Stress: Not on file  Social  Connections: Not on file  Intimate Partner Violence: Not on file    Family History:    Family History  Problem Relation Age of Onset   High blood pressure Mother    Diabetes Mother    Diabetes Father    Stroke Father    Cancer Sister        Colorectal cancer   Cancer Brother        colon     ROS:  Please see the history of present illness.   All other ROS reviewed and negative.     Physical Exam/Data:   Vitals:   02/19/22 0600 02/19/22 0700 02/19/22 0728 02/19/22 0756  BP: (!) 154/82 (!) 152/80    Pulse: 60 60 60   Resp: 19 19 16    Temp:    98.1 F (36.7 C)  TempSrc:    Oral  SpO2: 96% 97% 95%   Weight:      Height:        Intake/Output Summary (Last 24 hours) at 02/19/2022 0808 Last data filed at 02/19/2022 0700 Gross per 24 hour  Intake 1290.91 ml  Output 2600 ml  Net -1309.09 ml      02/19/2022    5:00 AM 02/18/2022    6:00 AM 02/16/2022    4:42 AM  Last 3 Weights  Weight (lbs) 178 lb 12.7 oz 176 lb 9.4 oz 183 lb 6.8 oz  Weight (kg) 81.1 kg 80.1 kg 83.2 kg     Body mass index is 29.75 kg/m.  General:  expressive aphasia HEENT: normal Neck: no JVD Cardiac:  normal S1, S2; RRR; no murmur  Lungs:  clear to auscultation bilaterally, no wheezing, rhonchi or rales  Abd: soft, nontender, no hepatomegaly  Ext: B LE edema R > L Musculoskeletal:  No deformities, BUE and BLE strength normal and equal Skin: warm and dry  Neuro:  CNs 2-12 intact, no focal abnormalities noted Psych:  Normal affect   EKG:  The EKG was personally reviewed and demonstrates:  atrial fibrillation with VR 100 Telemetry:  Telemetry was personally reviewed and demonstrates:  atrial fibrillation with rates in the 100, but was as high as 180s, now paced rhythm and appears sinus in the 60s  Relevant CV Studies:  Echo 02/16/22: 1. Left ventricular ejection fraction, by estimation, is 55 to 60%. The  left ventricle has normal function. The left ventricle has no regional  wall motion  abnormalities. There is mild concentric left ventricular  hypertrophy. Left ventricular diastolic  parameters are indeterminate.   2. Right ventricular systolic function is hyperdynamic. The right  ventricular size is normal. There is normal pulmonary artery systolic  pressure. The estimated right ventricular systolic pressure is 123XX123 mmHg.   3. The mitral valve is grossly normal. No evidence of mitral valve  regurgitation. No evidence of mitral stenosis.   4. The aortic valve is tricuspid. There is mild calcification of the  aortic valve. There is mild thickening of the aortic valve. Aortic valve  regurgitation is not visualized. Aortic valve sclerosis is present, with  no evidence of aortic valve stenosis.   5. The inferior vena cava is normal in size with greater than 50%  respiratory variability, suggesting right atrial pressure of 3 mmHg.   Laboratory Data:  High Sensitivity Troponin:  No results for input(s): "TROPONINIHS" in the last 720 hours.   Chemistry Recent Labs  Lab 02/16/22 0147 02/17/22 0256 02/17/22 1448 02/18/22 0251 02/19/22 0257  NA 143   < > 141 141 142  K 3.2*   < > 4.8 4.2 4.7  CL 111   < > 113* 114* 113*  CO2 18*   < > 23 21* 23  GLUCOSE 116*   < > 96 91 111*  BUN 45*   < > 29* 23 15  CREATININE 1.70*   < > 0.79 0.57 0.52  CALCIUM 7.9*   < > 8.1* 8.0* 8.1*  MG 1.7  --   --  1.9  --   GFRNONAA 32*   < > >60 >60 >60  ANIONGAP 14   < > 5 6 6    < > = values in this interval not displayed.    Recent Labs  Lab 02/15/22 1715 02/19/22 0257  PROT 8.2* 7.0  ALBUMIN 2.6* 2.0*  AST 29 45*  ALT 20 19  ALKPHOS 74 78  BILITOT 0.7 0.6   Lipids No results for input(s): "CHOL", "TRIG", "HDL", "LABVLDL", "LDLCALC", "CHOLHDL" in the last 168 hours.  Hematology Recent Labs  Lab 02/17/22 0256 02/18/22 0251 02/19/22 0257  WBC 14.7* 14.5* 11.2*  RBC 3.60* 3.92 3.95  HGB 9.3* 10.3* 10.2*  HCT 29.9* 33.7* 31.0*  MCV 83.1 86.0 78.5*  MCH 25.8* 26.3 25.8*   MCHC 31.1 30.6 32.9  RDW 18.4* 19.0* 18.3*  PLT 117* 120* 115*   Thyroid No results for input(s): "TSH", "FREET4" in the last 168 hours.  BNP Recent Labs  Lab 02/15/22 2120  BNP 442.0*    DDimer No results for input(s): "DDIMER" in the last 168 hours.   Radiology/Studies:  CT ABDOMEN PELVIS W CONTRAST  Result Date: 02/17/2022 CLINICAL DATA:  Pain left upper quadrant of abdomen EXAM: CT ABDOMEN AND PELVIS WITH CONTRAST TECHNIQUE: Multidetector CT imaging of the abdomen and pelvis was performed using the standard protocol following bolus administration of intravenous contrast. RADIATION DOSE REDUCTION: This exam was performed according to the departmental dose-optimization program which includes automated exposure control, adjustment of the  mA and/or kV according to patient size and/or use of iterative reconstruction technique. CONTRAST:  OMNIPAQUE IOHEXOL 300 MG/ML  SOLN COMPARISON:  05/29/2021 FINDINGS: Lower chest: There are patchy infiltrates in the visualized mid and lower lung fields, more so in the right lower lung field. Minimal bilateral pleural effusions are seen. Pacemaker leads are noted in place. Coronary artery calcifications are seen. Hepatobiliary: No focal abnormalities are seen in the liver. Surgical clips are seen in gallbladder fossa. Pancreas: No focal abnormalities are seen pancreas. Spleen: Unremarkable. 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 There is no hydronephrosis. There are no renal or ureteral stones. There is 3.9 cm cyst in the midportion of left kidney. Urinary bladder is not distended. Foley catheter is seen in the bladder. There is mild diffuse wall thickening in the bladder. There is mild enhancement in the wall of right renal pelvis. Stomach/Bowel: Surgical staples are seen in the stomach, possibly suggesting previous bariatric surgery. Small hiatal hernia is seen. Small bowel loops are not dilated. Appendix is not distinctly seen.  There is moderate to large amount of stool in the lumen of colon. There is interval decrease in amount of stool in right colon. Colon appears less distended. Vascular/Lymphatic: Scattered arterial calcifications are seen. Reproductive: There are calcified uterine fibroids. Other: There is no pneumoperitoneum. There is minimal ascites. There is a catheter entering the left upper abdomen with its tip in the course of jejunum suggesting jejunostomy catheter. There is no definite demonstrable thick-walled loculated fluid collection in the abdominal wall. There are small pockets of air adjacent to the proximal course of the jejunostomy, possibly within partly collapsed small bowel loop. Musculoskeletal: There is previous surgical fusion at the L4-L5 level. IMPRESSION: There is no evidence of intestinal obstruction or pneumoperitoneum. There is interval decrease in amount of stool in colon and interval decrease in colonic dilation. There is no hydronephrosis. There is mild enhancement in the wall of the right renal pelvis. There is diffuse wall thickening in the urinary bladder. Possibility of UTI is not excluded. Small patchy infiltrates are seen in lower lung fields suggesting atelectasis/pneumonia. Small bilateral pleural effusions. Coronary artery calcifications are seen. Other findings as described in the body of the report. Electronically Signed   By: Ernie Avena M.D.   On: 02/17/2022 13:15   DG Abd 1 View  Result Date: 02/17/2022 CLINICAL DATA:  Abdominal pain. Pain with palpation. EXAM: ABDOMEN - 1 VIEW COMPARISON:  One-view abdomen 09/06/2021 FINDINGS: Chronic elevation of the right hemidiaphragm noted. Pacing and defibrillator wires are stable. Sigmoid colon is mildly dilated. Bowel gas pattern is otherwise unremarkable. The remainder the colon is within normal limits. Lumbar fusion noted. IMPRESSION: Mild sigmoid distention without obstruction. Question focal ileus. No evidence for obstruction.  Electronically Signed   By: Marin Roberts M.D.   On: 02/17/2022 07:08   ECHOCARDIOGRAM COMPLETE  Result Date: 02/16/2022    ECHOCARDIOGRAM REPORT   Patient Name:   TAMEESHA ZASADA Date of Exam: 02/16/2022 Medical Rec #:  [id number]               Height:       65.0 in Accession #:    [id number]              Weight:       183.4 lb Date of Birth:  [birth year]               BSA:          1.907  m Patient Age:    89 years                BP:           117/88 mmHg Patient Gender: F                       HR:           120 bpm. Exam Location:  Inpatient Procedure: 2D Echo, Cardiac Doppler, Color Doppler and Intracardiac            Opacification Agent Indications:    Shock  History:        Patient has prior history of Echocardiogram examinations, most                 recent 12/11/2016. Stroke; Risk Factors:Hypertension, Diabetes                 and Dyslipidemia.  Sonographer:    Luane School RDCS Referring Phys: [place]  1. Left ventricular ejection fraction, by estimation, is 55 to 60%. The left ventricle has normal function. The left ventricle has no regional wall motion abnormalities. There is mild concentric left ventricular hypertrophy. Left ventricular diastolic parameters are indeterminate.  2. Right ventricular systolic function is hyperdynamic. The right ventricular size is normal. There is normal pulmonary artery systolic pressure. The estimated right ventricular systolic pressure is 123XX123 mmHg.  3. The mitral valve is grossly normal. No evidence of mitral valve regurgitation. No evidence of mitral stenosis.  4. The aortic valve is tricuspid. There is mild calcification of the aortic valve. There is mild thickening of the aortic valve. Aortic valve regurgitation is not visualized. Aortic valve sclerosis is present, with no evidence of aortic valve stenosis.  5. The inferior vena cava is normal in size with greater than 50% respiratory variability, suggesting right atrial pressure  of 3 mmHg. Comparison(s): No prior Echocardiogram. FINDINGS  Left Ventricle: Left ventricular ejection fraction, by estimation, is 55 to 60%. The left ventricle has normal function. The left ventricle has no regional wall motion abnormalities. Definity contrast agent was given IV to delineate the left ventricular  endocardial borders. The left ventricular internal cavity size was normal in size. There is mild concentric left ventricular hypertrophy. Left ventricular diastolic parameters are indeterminate. Right Ventricle: The right ventricular size is normal. No increase in right ventricular wall thickness. Right ventricular systolic function is hyperdynamic. There is normal pulmonary artery systolic pressure. The tricuspid regurgitant velocity is 2.43 m/s, and with an assumed right atrial pressure of 3 mmHg, the estimated right ventricular systolic pressure is 123XX123 mmHg. Left Atrium: Left atrial size was normal in size. Right Atrium: Right atrial size was normal in size. Pericardium: Trivial pericardial effusion is present. Mitral Valve: The mitral valve is grossly normal. No evidence of mitral valve regurgitation. No evidence of mitral valve stenosis. Tricuspid Valve: The tricuspid valve is normal in structure. Tricuspid valve regurgitation is mild . No evidence of tricuspid stenosis. Aortic Valve: The aortic valve is tricuspid. There is mild calcification of the aortic valve. There is mild thickening of the aortic valve. Aortic valve regurgitation is not visualized. Aortic valve sclerosis is present, with no evidence of aortic valve stenosis. Pulmonic Valve: The pulmonic valve was normal in structure. Pulmonic valve regurgitation is not visualized. No evidence of pulmonic stenosis. Aorta: The aortic root and ascending aorta are structurally normal, with no evidence of dilitation. Venous: The inferior vena cava  is normal in size with greater than 50% respiratory variability, suggesting right atrial pressure of 3  mmHg. IAS/Shunts: No atrial level shunt detected by color flow Doppler. Additional Comments: A device lead is visualized in the right atrium and right ventricle.  LEFT VENTRICLE PLAX 2D LVIDd:         4.50 cm   Diastology LVIDs:         3.20 cm   LV e' medial:    10.10 cm/s LV PW:         1.10 cm   LV E/e' medial:  10.7 LV IVS:        1.10 cm   LV e' lateral:   12.70 cm/s LVOT diam:     2.00 cm   LV E/e' lateral: 8.5 LV SV:         36 LV SV Index:   19 LVOT Area:     3.14 cm  RIGHT VENTRICLE             IVC RV S prime:     11.30 cm/s  IVC diam: 1.60 cm TAPSE (M-mode): 1.3 cm LEFT ATRIUM             Index        RIGHT ATRIUM          Index LA diam:        3.90 cm 2.05 cm/m   RA Area:     9.88 cm LA Vol (A2C):   53.8 ml 28.22 ml/m  RA Volume:   18.40 ml 9.65 ml/m LA Vol (A4C):   50.9 ml 26.70 ml/m LA Biplane Vol: 53.9 ml 28.27 ml/m  AORTIC VALVE LVOT Vmax:   86.60 cm/s LVOT Vmean:  56.300 cm/s LVOT VTI:    0.116 m  AORTA Ao Root diam: 2.80 cm Ao Asc diam:  3.65 cm Ao Desc diam: 1.60 cm MITRAL VALVE                TRICUSPID VALVE MV Area (PHT): 8.34 cm     TR Peak grad:   23.6 mmHg MV Decel Time: 91 msec      TR Vmax:        243.00 cm/s MV E velocity: 108.15 cm/s MV A velocity: 57.25 cm/s   SHUNTS MV E/A ratio:  1.89         Systemic VTI:  0.12 m                             Systemic Diam: 2.00 cm Riley Lam MD Electronically signed by Riley Lam MD Signature Date/Time: 02/16/2022/1:40:58 PM    Final    US RENAL  Result Date: 02/16/2022 CLINICAL DATA:  Sepsis. EXAM: RENAL / URINARY TRACT ULTRASOUND COMPLETE COMPARISON:  08/27/2018 FINDINGS: Suboptimal images due to patient body habitus as well as difficult breath-holding and immobility. Right Kidney: Renal measurements: 11.7 x 5.8 x 5.6 cm = volume: 199 mL. Echogenicity within normal limits. No mass or hydronephrosis visualized. Left Kidney: Renal measurements: 8.8 x 3.9 x 7.1 cm = volume: 126 mL. Echogenicity within normal limits. No  subtle mass or hydronephrosis visualized. 4.7 cm cyst over the upper pole. Bladder: Foley present within a decompressed bladder. Other: None. IMPRESSION: 1.  Normal size kidneys without hydronephrosis. 2.  4.7 cm left renal cyst. Electronically Signed   By: Elberta Fortis M.D.   On: 02/16/2022 13:07   CT HEAD WO CONTRAST ( )  Result  Date: 02/15/2022 CLINICAL DATA:  Sepsis altered EXAM: CT HEAD WITHOUT CONTRAST TECHNIQUE: Contiguous axial images were obtained from the base of the skull through the vertex without intravenous contrast. RADIATION DOSE REDUCTION: This exam was performed according to the departmental dose-optimization program which includes automated exposure control, adjustment of the mA and/or kV according to patient size and/or use of iterative reconstruction technique. COMPARISON:  CT brain 05/28/2021 FINDINGS: Brain: No acute territorial infarction, hemorrhage or intracranial mass. Atrophy and chronic small vessel ischemic changes of the white matter. Chronic left parietal infarct. Chronic right thalamic and left cerebellar infarcts. Stable ventricle size. Vascular: No hyperdense vessels.  Carotid vascular calcification Skull: Normal. Negative for fracture or focal lesion. Sinuses/Orbits: No acute finding. Other: None IMPRESSION: 1. No CT evidence for acute intracranial abnormality. 2. Atrophy and chronic small vessel ischemic changes of the white matter. Multiple chronic infarcts Electronically Signed   By: Donavan Foil M.D.   On: 02/15/2022 20:28   DG Chest Port 1 View  Result Date: 02/15/2022 CLINICAL DATA:  Questionable sepsis EXAM: PORTABLE CHEST 1 VIEW COMPARISON:  05/29/2021 FINDINGS: Heart size is normal. Left chest multi lead pacer. Unchanged elevation of the right hemidiaphragm. Both lungs are clear. The visualized skeletal structures are unremarkable. IMPRESSION: No acute abnormality of the lungs in AP portable projection. Electronically Signed   By: Delanna Ahmadi M.D.   On:  02/15/2022 18:16     Assessment and Plan:   Afib with RVR Hx of PVCs - has a history of "persistent Afib" noted in [place] - has not been on a BB - started on IV amiodarone with conversion to sinus, now pace rhythm - suspect RVR related to sepsis - will likely improve as sepsis improves - echo reassuring - will collect TSH  - K and Mg WNL - agree with metoprolol - transition amiodarone to PO formulation - will need to discuss next steps with cardiology team at [facility]   Hx of CVA, intracranial bleed, seizure disorder Hx of cerebral amyloid angiopathy Will hold off on [place] at this time as her bleeding risk currently outweighs her stroke risk May be a candidate for watchman device Would need to discuss anticoagulation with neurology   SSS s/p dual chamber medtronic PPM Recent gen change at [facility] - I have requested an interrogation to determine Afib burden as "persistent Afib" was mentioned in a prior CE note   Hypertension Holding home agents for rate control   Hyperlipidemia Continue zocor   Risk Assessment/Risk Scores:        CHA2DS2-VASc Score = 6   This indicates a 9.7% annual risk of stroke. The patient's score is based upon: CHF History: 0 HTN History: 1 Diabetes History: 1 Stroke History: 2 Vascular Disease History: 0 Age Score: 1 Gender Score: 1       For questions or updates, please contact [place] Please consult [email or web address] for contact info under    Signed, Ledora Bottcher, PA  02/19/2022 8:08 AM

## 2022-02-19 NOTE — Progress Notes (Signed)
Device interrogated.  Afib RVR started on July 21 with 4% burden since April (no data prior to gen change).

## 2022-02-19 NOTE — Plan of Care (Signed)

## 2022-02-20 DIAGNOSIS — A419 Sepsis, unspecified organism: Secondary | ICD-10-CM | POA: Diagnosis not present

## 2022-02-20 DIAGNOSIS — I629 Nontraumatic intracranial hemorrhage, unspecified: Secondary | ICD-10-CM | POA: Diagnosis not present

## 2022-02-20 DIAGNOSIS — I10 Essential (primary) hypertension: Secondary | ICD-10-CM | POA: Diagnosis not present

## 2022-02-20 DIAGNOSIS — Z931 Gastrostomy status: Secondary | ICD-10-CM

## 2022-02-20 DIAGNOSIS — R569 Unspecified convulsions: Secondary | ICD-10-CM

## 2022-02-20 DIAGNOSIS — Z978 Presence of other specified devices: Secondary | ICD-10-CM | POA: Diagnosis not present

## 2022-02-20 LAB — CBC
HCT: 30.4 % — ABNORMAL LOW (ref 36.0–46.0)
Hemoglobin: 9.5 g/dL — ABNORMAL LOW (ref 12.0–15.0)
MCH: 25.7 pg — ABNORMAL LOW (ref 26.0–34.0)
MCHC: 31.3 g/dL (ref 30.0–36.0)
MCV: 82.4 fL (ref 80.0–100.0)
Platelets: 121 10*3/uL — ABNORMAL LOW (ref 150–400)
RBC: 3.69 MIL/uL — ABNORMAL LOW (ref 3.87–5.11)
RDW: 19.1 % — ABNORMAL HIGH (ref 11.5–15.5)
WBC: 9.7 10*3/uL (ref 4.0–10.5)
nRBC: 0 % (ref 0.0–0.2)

## 2022-02-20 LAB — GLUCOSE, CAPILLARY
Glucose-Capillary: 91 mg/dL (ref 70–99)
Glucose-Capillary: 113 mg/dL — ABNORMAL HIGH (ref 70–99)
Glucose-Capillary: 153 mg/dL — ABNORMAL HIGH (ref 70–99)
Glucose-Capillary: 140 mg/dL — ABNORMAL HIGH (ref 70–99)
Glucose-Capillary: 97 mg/dL (ref 70–99)

## 2022-02-20 LAB — CULTURE, BLOOD (ROUTINE X 2)
Culture: NO GROWTH
Culture: NO GROWTH
Special Requests: ADEQUATE

## 2022-02-20 LAB — BASIC METABOLIC PANEL
Anion gap: 8 (ref 5–15)
BUN: 14 mg/dL (ref 8–23)
CO2: 24 mmol/L (ref 22–32)
Calcium: 8 mg/dL — ABNORMAL LOW (ref 8.9–10.3)
Chloride: 107 mmol/L (ref 98–111)
Creatinine, Ser: 0.53 mg/dL (ref 0.44–1.00)
GFR, Estimated: >60 mL/min (ref >60)
Glucose, Bld: 111 mg/dL — ABNORMAL HIGH (ref 70–99)
Potassium: 2.7 mmol/L — CL (ref 3.5–5.1)
Sodium: 139 mmol/L (ref 135–145)

## 2022-02-20 LAB — MAGNESIUM
Magnesium: 1.7 mg/dL (ref 1.7–2.4)
Magnesium: 1.7 mg/dL (ref 1.7–2.4)

## 2022-02-20 LAB — PHOSPHORUS
Phosphorus: 2.6 mg/dL (ref 2.5–4.6)
Phosphorus: 1.6 mg/dL — ABNORMAL LOW (ref 2.5–4.6)

## 2022-02-20 LAB — VITAMIN D 25 HYDROXY (VIT D DEFICIENCY, FRACTURES): Vit D, 25-Hydroxy: 24.67 ng/mL — ABNORMAL LOW (ref 30–100)

## 2022-02-20 MED ORDER — POLYETHYLENE GLYCOL 3350 17 G PO PACK: 34.0000 g | PACK | Freq: Every day | ORAL | Status: DC | PRN

## 2022-02-20 MED ORDER — POTASSIUM CHLORIDE 20 MEQ PO PACK
40.0000 meq | PACK | ORAL | Status: AC
  Administered 2022-02-20 (×2): 40 meq via ORAL
  Filled 2022-02-20 (×2): qty 2

## 2022-02-20 MED ORDER — ACETAMINOPHEN 500 MG PO TABS
1000.0000 mg | ORAL_TABLET | Freq: Three times a day (TID) | ORAL | Status: DC
Start: 2022-02-20 — End: 2022-02-22
  Administered 2022-02-20 – 2022-02-22 (×6): 1000 mg
  Filled 2022-02-20 (×6): qty 2

## 2022-02-20 MED ORDER — SODIUM PHOSPHATES 45 MMOLE/15ML IV SOLN
15.0000 mmol | Freq: Once | INTRAVENOUS | Status: AC
  Administered 2022-02-20: 15 mmol via INTRAVENOUS
  Filled 2022-02-20: qty 5

## 2022-02-20 NOTE — Progress Notes (Signed)
PROGRESS NOTE    Kathleen Jensen  MPN:[id number] DOB: 11/26/1950 DOA: 02/15/2022 PCP: Patient, No Pcp Per   Brief Narrative:  71 year old female who is a resident of a skilled nursing facility, chronic Foley catheter, sacral decubitus ulcer, PEG tube for poor p.o. 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  She also developed A-fib with RVR needing amiodarone infusion and cardiology consultation.  She was transferred to [facility] service from 02/18/2022 onwards.  Assessment & Plan:   Septic shock: Present on admission UTI: Present on admission, possibly associated with chronic Foley catheter Chronic indwelling Foley catheter -Patient presented with septic shock and initially required ICU admission and pressors.  Currently off of pressors.  Transferred to [facility] service from 02/18/2022 onwards. -Urine culture positive for Klebsiella and Proteus.  Antibiotics have been changed to Ancef. -CT abdomen unrevealing -Has required Foley catheter since February 2023.  Will need outpatient urology follow-up. -Hemodynamically improving  Acute kidney injury -Treated with IV fluids.  Resolved.  Leukocytosis -Resolved  Paroxysmal new onset A-fib with RVR -Cardiology following.  IV amiodarone has been switched to amiodarone via tube.  Continue metoprolol.  Not a good candidate for anticoagulation because of risk for intracerebral bleeding.  Will need to follow-up with neurology regarding anticoagulation -Currently rate controlled  Hypokalemia -Replace.  Repeat a.m. labs  Anemia of chronic disease -Hemoglobin stable.  Monitor intermittently  Thrombocytopenia -No signs of bleeding.  Monitor intermittently.  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 -Daughter reports that PEG tube was placed in February 2023 due to failure to thrive and poor p.o. intake -She said that SNF staff has largely stopped using PEG tube since she is now been eating by mouth -Seen by SLP with recommendations to continue n.p.o. due to cognitive issues - consult palliative care for goals of care discussion  Stage IV sacral pressure injury: Present on admission -Follow recommendations from wound care nurse for dressing changes  Goals of care -Currently listed as full code.  Consult palliative care  Hyperlipidemia -Continue statin  Hypothyroidism -Continue Synthroid    DVT prophylaxis: Heparin subcutaneous Code Status: Full Family Communication: Daughter at bedside Disposition Plan: Status is: Inpatient Remains inpatient appropriate because: Of severity of illness    Consultants: PCCM.  Cardiology.  Consult palliative care  Procedures: None  Antimicrobials:  Anti-infectives (From admission, onward)    Start     Dose/Rate Route Frequency Ordered Stop   02/18/22 1400  ceFAZolin (ANCEF) IVPB 2g/100 mL premix        2 g 200 mL/hr over 30 Minutes Intravenous Every 8 hours 02/18/22 1309     02/17/22 1800  vancomycin (VANCOREADY) IVPB 1250 mg/250 mL  Status:  Discontinued        1,250 mg 166.7 mL/hr over 90 Minutes Intravenous Every 48 hours 02/15/22 2019 02/16/22 0759   02/17/22 1800  vancomycin (VANCOREADY) IVPB 1500 mg/300 mL  Status:  Discontinued        1,500 mg 150 mL/hr over 120 Minutes Intravenous Every 48 hours 02/16/22 0759 02/17/22 0547   02/17/22 1000  vancomycin (VANCOREADY) IVPB 1500 mg/300 mL  Status:  Discontinued        1,500  mg 150 mL/hr over 120 Minutes Intravenous Every 24 hours 02/17/22 0547 02/17/22 0844   02/17/22 0600  ceFEPIme (MAXIPIME) 2 g in sodium  chloride 0.9 % 100 mL IVPB  Status:  Discontinued        2 g 200 mL/hr over 30 Minutes Intravenous Every 8 hours 02/17/22 0547 02/18/22 1309   02/16/22 1800  ceFEPIme (MAXIPIME) 2 g in sodium chloride 0.9 % 100 mL IVPB  Status:  Discontinued        2 g 200 mL/hr over 30 Minutes Intravenous Every 24 hours 02/15/22 1957 02/17/22 0547   02/15/22 1900  ceFEPIme (MAXIPIME) 2 g in sodium chloride 0.9 % 100 mL IVPB        2 g 200 mL/hr over 30 Minutes Intravenous STAT 02/15/22 1850 02/15/22 1942   02/15/22 1900  vancomycin (VANCOREADY) IVPB 1500 mg/300 mL        1,500 mg 150 mL/hr over 120 Minutes Intravenous STAT 02/15/22 1850 02/15/22 2141   02/15/22 1700  cefTRIAXone (ROCEPHIN) 2 g in sodium chloride 0.9 % 100 mL IVPB  Status:  Discontinued        2 g 200 mL/hr over 30 Minutes Intravenous Every 24 hours 02/15/22 1659 02/15/22 1849        Subjective: Patient seen and examined at bedside.  Daughter present at bedside.  Speech difficult to comprehend due to aphasia.  No overnight agitation, fever or vomiting reported.  Objective: Vitals:   02/20/22 0900 02/20/22 1000 02/20/22 1100 02/20/22 1153  BP: 129/63 136/78 (!) 154/60   Pulse: 61 62 62   Resp: 18 17 20    Temp:    (!) 96.7 F (35.9 C)  TempSrc:    Axillary  SpO2: 96% 95% 95%   Weight:      Height:        Intake/Output Summary (Last 24 hours) at 02/20/2022 1155 Last data filed at 02/20/2022 1100 Gross per 24 hour  Intake 1810.42 ml  Output 850 ml  Net 960.42 ml   Filed Weights   02/18/22 0600 02/19/22 0500 02/20/22 0216  Weight: 80.1 kg 81.1 kg 81.7 kg    Examination:  General exam: Appears calm and comfortable.  Looks chronically ill and deconditioned.  Currently on room air. Respiratory system: Bilateral decreased breath sounds at bases with scattered crackles Cardiovascular system: S1 & S2 heard, Rate controlled Gastrointestinal system: Abdomen is nondistended, soft and mildly tender in the lower quadrant.  Normal  bowel sounds heard.  PEG tube present. Extremities: No cyanosis, clubbing; trace lower extremity edema present Central nervous system: Awake, has expressive aphasia.  Right hemiparesis.   Skin: No obvious ecchymosis/lesions  psychiatry: Could not be assessed because of mental status   Data Reviewed: I have personally reviewed following labs and imaging studies  CBC: Recent Labs  Lab 02/15/22 1715 02/15/22 2120 02/16/22 0147 02/17/22 0256 02/18/22 0251 02/19/22 0257 02/20/22 0604  WBC 16.8*   < > 16.3* 14.7* 14.5* 11.2* 9.7  NEUTROABS 12.8*  --   --   --   --   --   --   HGB 12.1   < > 11.5* 9.3* 10.3* 10.2* 9.5*  HCT 38.7   < > 37.3 29.9* 33.7* 31.0* 30.4*  MCV 83.4   < > 83.6 83.1 86.0 78.5* 82.4  PLT 176   < > 147* 117* 120* 115* 121*   < > = values in this interval not displayed.   Basic Metabolic Panel: Recent Labs  Lab 02/16/22  [id number] 02/17/22 0256 02/17/22 1448 02/18/22 0251 02/19/22 0257 02/19/22 1300 02/19/22 1637 02/20/22 0604  NA 143 140 141 141 142  --   --  139  K 3.2* 3.1* 4.8 4.2 4.7  --   --  2.7*  CL 111 112* 113* 114* 113*  --   --  107  CO2 18* 21* 23 21* 23  --   --  24  GLUCOSE 116* 112* 96 91 111*  --   --  111*  BUN 45* 30* 29* 23 15  --   --  14  CREATININE 1.70* 0.77 0.79 0.57 0.52  --   --  0.53  CALCIUM 7.9* 7.5* 8.1* 8.0* 8.1*  --   --  8.0*  MG 1.7  --   --  1.9  --  1.8 1.7 1.7  PHOS 4.2  --   --   --   --  2.4* 2.2* 2.6   GFR: Estimated Creatinine Clearance: 68.1 mL/min (by C-G formula based on SCr of 0.53 mg/dL). Liver Function Tests: Recent Labs  Lab 02/15/22 1715 02/19/22 0257  AST 29 45*  ALT 20 19  ALKPHOS 74 78  BILITOT 0.7 0.6  PROT 8.2* 7.0  ALBUMIN 2.6* 2.0*   Recent Labs  Lab 02/19/22 0305  LIPASE 22   No results for input(s): "AMMONIA" in the last 168 hours. Coagulation Profile: Recent Labs  Lab 02/15/22 1715  INR 1.6*   Cardiac Enzymes: No results for input(s): "CKTOTAL", "CKMB", "CKMBINDEX",  "TROPONINI" in the last 168 hours. BNP (last 3 results) No results for input(s): "PROBNP" in the last 8760 hours. HbA1C: No results for input(s): "HGBA1C" in the last 72 hours. CBG: Recent Labs  Lab 02/19/22 1923 02/19/22 2310 02/20/22 0310 02/20/22 0821 02/20/22 1149  GLUCAP 137* 143* 91 113* 153*   Lipid Profile: No results for input(s): "CHOL", "HDL", "LDLCALC", "TRIG", "CHOLHDL", "LDLDIRECT" in the last 72 hours. Thyroid Function Tests: No results for input(s): "TSH", "T4TOTAL", "FREET4", "T3FREE", "THYROIDAB" in the last 72 hours. Anemia Panel: No results for input(s): "VITAMINB12", "FOLATE", "FERRITIN", "TIBC", "IRON", "RETICCTPCT" in the last 72 hours. Sepsis Labs: Recent Labs  Lab 02/15/22 2120 02/16/22 0147 02/16/22 0852 02/16/22 1317 02/17/22 0256 02/17/22 0653  PROCALCITON 1.53 4.60  --   --  5.14  --   LATICACIDVEN 6.9* 5.6* 2.5* 2.7*  --  1.6    Recent Results (from the past 240 hour(s))  Urine Culture     Status: Abnormal   Collection Time: 02/15/22  4:59 PM   Specimen: In/Out Cath Urine  Result Value Ref Range Status   Specimen Description   Final    IN/OUT CATH URINE Performed at [facility], [place] [address] [place], [place] [address]    Special Requests   Final    NONE Performed at [facility], [place] [address], [place], [place] [address]    Culture (A)  Final    >=100,000 COLONIES/mL KLEBSIELLA PNEUMONIAE 40,000 COLONIES/mL PROTEUS MIRABILIS    Report Status 02/17/2022 FINAL  Final   Organism ID, Bacteria KLEBSIELLA PNEUMONIAE (A)  Final   Organism ID, Bacteria PROTEUS MIRABILIS (A)  Final      Susceptibility   Klebsiella pneumoniae - MIC*    AMPICILLIN RESISTANT Resistant     CEFAZOLIN <=4 SENSITIVE Sensitive     CEFEPIME <=0.12 SENSITIVE Sensitive     CEFTRIAXONE <=0.25 SENSITIVE Sensitive     CIPROFLOXACIN <=0.25 SENSITIVE Sensitive     GENTAMICIN <=1  SENSITIVE Sensitive     IMIPENEM <=0.25  SENSITIVE Sensitive     NITROFURANTOIN 64 INTERMEDIATE Intermediate     TRIMETH/SULFA <=20 SENSITIVE Sensitive     AMPICILLIN/SULBACTAM 4 SENSITIVE Sensitive     PIP/TAZO <=4 SENSITIVE Sensitive     * >=100,000 COLONIES/mL KLEBSIELLA PNEUMONIAE   Proteus mirabilis - MIC*    AMPICILLIN <=2 SENSITIVE Sensitive     CEFAZOLIN <=4 SENSITIVE Sensitive     CEFEPIME <=0.12 SENSITIVE Sensitive     CEFTRIAXONE <=0.25 SENSITIVE Sensitive     CIPROFLOXACIN >=4 RESISTANT Resistant     GENTAMICIN <=1 SENSITIVE Sensitive     IMIPENEM 2 SENSITIVE Sensitive     NITROFURANTOIN 128 RESISTANT Resistant     TRIMETH/SULFA <=20 SENSITIVE Sensitive     AMPICILLIN/SULBACTAM <=2 SENSITIVE Sensitive     PIP/TAZO <=4 SENSITIVE Sensitive     * 40,000 COLONIES/mL PROTEUS MIRABILIS  Blood Culture (routine x 2)     Status: None   Collection Time: 02/15/22  5:04 PM   Specimen: BLOOD  Result Value Ref Range Status   Specimen Description   Final    BLOOD LT HAND Performed at [place] [address] [place], [place] [address]    Special Requests   Final    BOTTLES DRAWN AEROBIC AND ANAEROBIC BCAV Performed at [facility], [place] [address], [place], [place] [address]    Culture   Final    NO GROWTH 5 DAYS Performed at [facility], [place] [address], [place], [place] [address]    Report Status 02/20/2022 FINAL  Final  Blood Culture (routine x 2)     Status: None   Collection Time: 02/15/22  5:15 PM   Specimen: BLOOD  Result Value Ref Range Status   Specimen Description   Final    BLOOD SITE NOT SPECIFIED Performed at [place] [address] [place], [place] [address]    Special Requests   Final    BOTTLES DRAWN AEROBIC AND ANAEROBIC Blood Culture adequate volume Performed at [place] [address], [place], [place] [address]    Culture   Final    NO GROWTH 5 DAYS Performed at [facility], [place]  [address] [place], [place] [address]    Report Status 02/20/2022 FINAL  Final  Resp Panel by RT-PCR (Flu A&B, Covid) Anterior Nasal Swab     Status: None   Collection Time: 02/15/22  6:12 PM   Specimen: Anterior Nasal Swab  Result Value Ref Range Status   SARS Coronavirus 2 by RT PCR NEGATIVE NEGATIVE Final    Comment: (NOTE) SARS-CoV-2 target nucleic acids are NOT DETECTED.  The SARS-CoV-2 RNA is generally detectable in upper respiratory specimens during the acute phase of infection. The lowest concentration of SARS-CoV-2 viral copies this assay can detect is 138 copies/mL. A negative result does not preclude SARS-Cov-2 infection and should not be used as the sole basis for treatment or other patient management decisions. A negative result may occur with  improper specimen collection/handling, submission of specimen other than nasopharyngeal swab, presence of viral mutation(s) within the areas targeted by this assay, and inadequate number of viral copies(<138 copies/mL). A negative result must be combined with clinical observations, patient history, and epidemiological information. The expected result is Negative.  Fact Sheet for Patients:  EntrepreneurPulse.com.au  Fact Sheet for Healthcare Providers:  IncredibleEmployment.be  This test is no t yet approved or cleared by the [place]  States FDA and  has been authorized for detection and/or diagnosis of SARS-CoV-2 by FDA under an Emergency Use Authorization (EUA). This EUA will remain  in effect (meaning this test can be used) for the duration of the COVID-19 declaration under Section 564(b)(1) of the Act, 21 U.S.C.section 360bbb-3(b)(1), unless the authorization is terminated  or revoked sooner.       Influenza A by PCR NEGATIVE NEGATIVE Final   Influenza B by PCR NEGATIVE NEGATIVE Final    Comment: (NOTE) The Xpert Xpress SARS-CoV-2/FLU/RSV plus assay is intended as an aid in the diagnosis of  influenza from Nasopharyngeal swab specimens and should not be used as a sole basis for treatment. Nasal washings and aspirates are unacceptable for Xpert Xpress SARS-CoV-2/FLU/RSV testing.  Fact Sheet for Patients: BloggerCourse.com  Fact Sheet for Healthcare Providers: SeriousBroker.it  This test is not yet approved or cleared by the [place] FDA and has been authorized for detection and/or diagnosis of SARS-CoV-2 by FDA under an Emergency Use Authorization (EUA). This EUA will remain in effect (meaning this test can be used) for the duration of the COVID-19 declaration under Section 564(b)(1) of the Act, 21 U.S.C. section 360bbb-3(b)(1), unless the authorization is terminated or revoked.  Performed at [facility], [address] [place], [place] [address]   MRSA Next Gen by PCR, Nasal     Status: None   Collection Time: 02/15/22  9:40 PM   Specimen: Nasal Mucosa; Nasal Swab  Result Value Ref Range Status   MRSA by PCR Next Gen NOT DETECTED NOT DETECTED Final    Comment: (NOTE) The GeneXpert MRSA Assay (FDA approved for NASAL specimens only), is one component of a comprehensive MRSA colonization surveillance program. It is not intended to diagnose MRSA infection nor to guide or monitor treatment for MRSA infections. Test performance is not FDA approved in patients less than 61 years old. Performed at [facility], [address], [place], [place] [address]          Radiology Studies: No results found.      Scheduled Meds:  acetaminophen  1,000 mg Per Tube Q8H   amiodarone  200 mg Per Tube BID   [START ON 03/02/2022] amiodarone  200 mg Per Tube Daily   vitamin C  500 mg Per Tube BID   Chlorhexidine Gluconate Cloth  6 each Topical Q0600   feeding supplement (PROSource TF)  45 mL Per Tube TID   free water  100 mL Per Tube Q4H   heparin injection (subcutaneous)  5,000  Units Subcutaneous Q8H   levETIRAcetam  1,000 mg Per Tube BID   levothyroxine  75 mcg Per Tube Q0600   metoprolol tartrate  12.5 mg Per Tube BID   mouth rinse  15 mL Mouth Rinse 4 times per day   pantoprazole sodium  40 mg Per Tube Daily   phenytoin  187.5 mg Per Tube BID   potassium chloride  40 mEq Oral Q4H   sennosides  10 mL Per Tube BID   valproic acid  500 mg Per Tube QID   zinc sulfate  220 mg Per Tube Daily   Continuous Infusions:  sodium chloride Stopped (02/15/22 1927)    ceFAZolin (ANCEF) IV Stopped (02/20/22 6073)   feeding supplement (JEVITY 1.2 CAL) 35 mL/hr at 02/20/22 1100   lactated ringers Stopped (02/15/22 2314)          Glade Lloyd, MD [facility] 02/20/2022, 11:55 AM

## 2022-02-20 NOTE — Progress Notes (Signed)
Speech Language Pathology Treatment: Dysphagia  Patient Details Name: Jazmeen Axtell MRN: [id number] DOB: 1950/10/23 Today's Date: 02/20/2022 Time: 1000-1015 SLP Time Calculation (min) (ACUTE ONLY): 15 min  Assessment / Plan / Recommendation Clinical Impression  Patient seen by SLP for skilled treatment focused on dysphagia goals. Patient appeared calm and was agreeable to having some water, nodding head yes and verbalizing "yeah". When SLP would bring straw to lips as he had done previous date, she would start to attempt to engage but would only open mouth, keeping teeth clenched. She was not able to form lips around straw and was not able to demonstrate ability to take in water into oral cavity. SLP attempted several more times with no success and with some attempts patient turning head away. She did appear with discomfort but was unable to verbally or non-verbally indicate source of pain (SLP suspects sacral wound). SLP spoke with RN after session regarding continuing NPO status.SLP will continue to follow patient for PO trials.    HPI HPI: Patient is a 71 y.o. female with PMH: CVA with residual aphasia, chronic sacral wound, DM, seizures, CKD, GERD, HTN, HLD. She is a SNF resident and presented to the hospital on 02/15/22 with AMS. In ED, she had ongoing hypotension, Tmax of 100.2, UA concerning for UTI, AKI. She had PEG 09/17/2021. CT head negative for acute intracranial abnormality. Chest x-ray without pneumothorax, effusion, infiltrate.      SLP Plan  Continue with current plan of care      Recommendations for follow up therapy are one component of a multi-disciplinary discharge planning process, led by the attending physician.  Recommendations may be updated based on patient status, additional functional criteria and insurance authorization.    Recommendations  Diet recommendations: NPO;Other(comment) (ice chips/water sips PRN) Medication Administration: Via alternative means                 Oral Care Recommendations: Oral care BID;Staff/trained caregiver to provide oral care Follow Up Recommendations: Skilled nursing-short term rehab (<3 hours/day) Assistance recommended at discharge: Frequent or constant Supervision/Assistance SLP Visit Diagnosis: Dysphagia, unspecified (R13.10) Plan: Continue with current plan of care          Angela Nevin, MA, CCC-SLP Speech Therapy

## 2022-02-21 DIAGNOSIS — A419 Sepsis, unspecified organism: Secondary | ICD-10-CM | POA: Diagnosis not present

## 2022-02-21 DIAGNOSIS — R6521 Severe sepsis with septic shock: Secondary | ICD-10-CM | POA: Diagnosis not present

## 2022-02-21 DIAGNOSIS — Z950 Presence of cardiac pacemaker: Secondary | ICD-10-CM | POA: Diagnosis not present

## 2022-02-21 DIAGNOSIS — G8191 Hemiplegia, unspecified affecting right dominant side: Secondary | ICD-10-CM

## 2022-02-21 DIAGNOSIS — I10 Essential (primary) hypertension: Secondary | ICD-10-CM | POA: Diagnosis not present

## 2022-02-21 DIAGNOSIS — Z978 Presence of other specified devices: Secondary | ICD-10-CM | POA: Diagnosis not present

## 2022-02-21 LAB — BASIC METABOLIC PANEL
Anion gap: 8 (ref 5–15)
BUN: 16 mg/dL (ref 8–23)
CO2: 24 mmol/L (ref 22–32)
Calcium: 7.3 mg/dL — ABNORMAL LOW (ref 8.9–10.3)
Chloride: 105 mmol/L (ref 98–111)
Creatinine, Ser: 0.43 mg/dL — ABNORMAL LOW (ref 0.44–1.00)
GFR, Estimated: >60 mL/min (ref >60)
Glucose, Bld: 115 mg/dL — ABNORMAL HIGH (ref 70–99)
Potassium: 3.2 mmol/L — ABNORMAL LOW (ref 3.5–5.1)
Sodium: 137 mmol/L (ref 135–145)

## 2022-02-21 LAB — CBC WITH DIFFERENTIAL/PLATELET
Abs Immature Granulocytes: 0.69 10*3/uL — ABNORMAL HIGH (ref 0.00–0.07)
Basophils Absolute: 0.1 10*3/uL (ref 0.0–0.1)
Basophils Relative: 1 %
Eosinophils Absolute: 0.1 10*3/uL (ref 0.0–0.5)
Eosinophils Relative: 1 %
HCT: 31.2 % — ABNORMAL LOW (ref 36.0–46.0)
Hemoglobin: 9.9 g/dL — ABNORMAL LOW (ref 12.0–15.0)
Immature Granulocytes: 6 %
Lymphocytes Relative: 27 %
Lymphs Abs: 3.2 10*3/uL (ref 0.7–4.0)
MCH: 26.3 pg (ref 26.0–34.0)
MCHC: 31.7 g/dL (ref 30.0–36.0)
MCV: 83 fL (ref 80.0–100.0)
Monocytes Absolute: 2.1 10*3/uL — ABNORMAL HIGH (ref 0.1–1.0)
Monocytes Relative: 18 %
Neutro Abs: 5.5 10*3/uL (ref 1.7–7.7)
Neutrophils Relative %: 47 %
Platelets: 117 10*3/uL — ABNORMAL LOW (ref 150–400)
RBC: 3.76 MIL/uL — ABNORMAL LOW (ref 3.87–5.11)
RDW: 19.2 % — ABNORMAL HIGH (ref 11.5–15.5)
WBC: 11.6 10*3/uL — ABNORMAL HIGH (ref 4.0–10.5)
nRBC: 0.2 % (ref 0.0–0.2)

## 2022-02-21 LAB — GLUCOSE, CAPILLARY
Glucose-Capillary: 115 mg/dL — ABNORMAL HIGH (ref 70–99)
Glucose-Capillary: 115 mg/dL — ABNORMAL HIGH (ref 70–99)
Glucose-Capillary: 117 mg/dL — ABNORMAL HIGH (ref 70–99)
Glucose-Capillary: 125 mg/dL — ABNORMAL HIGH (ref 70–99)
Glucose-Capillary: 90 mg/dL (ref 70–99)
Glucose-Capillary: 98 mg/dL (ref 70–99)

## 2022-02-21 LAB — MAGNESIUM: Magnesium: 1.8 mg/dL (ref 1.7–2.4)

## 2022-02-21 MED ORDER — MORPHINE SULFATE (PF) 2 MG/ML IV SOLN
1.0000 mg | INTRAVENOUS | Status: DC | PRN
  Administered 2022-02-21 – 2022-02-22 (×3): 1 mg via INTRAVENOUS
  Filled 2022-02-21 (×3): qty 1

## 2022-02-21 MED ORDER — POTASSIUM CHLORIDE 20 MEQ PO PACK
40.0000 meq | PACK | ORAL | Status: DC
  Administered 2022-02-21: 40 meq via ORAL
  Filled 2022-02-21: qty 2

## 2022-02-21 MED ORDER — POTASSIUM CHLORIDE 20 MEQ PO PACK
40.0000 meq | PACK | ORAL | Status: AC
  Administered 2022-02-21: 40 meq
  Filled 2022-02-21: qty 2

## 2022-02-21 NOTE — Progress Notes (Signed)
The patient arrived from the unit. Per bedside updated report the patient had recently been transferred to another bed right before coming to the unit. Peg, Foley are patent. The pt is tearful.Will update night coverage to continue to monitor and reassess VS

## 2022-02-21 NOTE — Progress Notes (Signed)
PROGRESS NOTE    Samirah Macarthur  [id number] DOB: 02-Nov-1950 DOA: 02/15/2022 PCP: Patient, No Pcp Per   Brief Narrative:  71 year old female who is a resident of a skilled nursing facility, chronic Foley catheter, sacral decubitus ulcer, PEG tube for poor p.o. 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  She also developed A-fib with RVR needing amiodarone infusion and cardiology consultation.  She was transferred to [facility] service from 02/18/2022 onwards.  Assessment & Plan:   Septic shock: Present on admission UTI: Present on admission, possibly associated with chronic Foley catheter Chronic indwelling Foley catheter -Patient presented with septic shock and initially required ICU admission and pressors.  Currently off of pressors.  Transferred to [facility] service from 02/18/2022 onwards. -Urine culture positive for Klebsiella and Proteus.  Antibiotics have been changed to Ancef. -CT abdomen unrevealing -Has required Foley catheter since February 2023.  Will need outpatient urology follow-up. -Hemodynamically improving  Acute kidney injury -Treated with IV fluids.  Resolved.  Leukocytosis -Resolved  Paroxysmal new onset A-fib with RVR -Cardiology following.  IV amiodarone has been switched to amiodarone via tube.  Continue metoprolol.  Not a good candidate for anticoagulation because of risk for intracerebral bleeding.  Will need to follow-up with neurology regarding anticoagulation -Currently rate controlled  Hypokalemia -Replace.  Repeat a.m. labs  Anemia of chronic disease -Hemoglobin stable.  Monitor intermittently  Thrombocytopenia -No signs of bleeding.  Monitor intermittently.  Leukocytosis -Mild.  Monitor intermittently.  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 -Daughter reports that PEG tube was placed in February 2023 due to failure to thrive and poor p.o. intake -She said that SNF staff has largely stopped using PEG tube since she is now been eating by mouth -Seen by SLP with recommendations to continue n.p.o. due to cognitive issues - consult palliative care for goals of care discussion  Stage IV sacral pressure injury: Present on admission -Follow recommendations from wound care nurse for dressing changes  Goals of care -Currently listed as full code.  Consult palliative care  Hyperlipidemia -Continue statin  Hypothyroidism -Continue Synthroid    DVT prophylaxis: Heparin subcutaneous Code Status: Full Family Communication: Daughter at bedside on 02/20/2022.  None at bedside today. Disposition Plan: Status is: Inpatient Remains inpatient appropriate because: Of severity of illness   Consultants: PCCM.  Cardiology. Palliative care  Procedures: None  Antimicrobials:  Anti-infectives (From admission, onward)    Start     Dose/Rate Route Frequency Ordered Stop   02/18/22 1400  ceFAZolin (ANCEF) IVPB 2g/100 mL premix        2 g 200 mL/hr over 30 Minutes Intravenous Every 8 hours 02/18/22 1309     02/17/22 1800  vancomycin (VANCOREADY) IVPB 1250 mg/250 mL  Status:  Discontinued        1,250 mg 166.7 mL/hr over 90 Minutes Intravenous Every 48 hours 02/15/22 2019 02/16/22 0759   02/17/22 1800  vancomycin (VANCOREADY) IVPB 1500 mg/300 mL  Status:  Discontinued        1,500 mg 150 mL/hr over 120 Minutes Intravenous Every 48 hours 02/16/22 0759 02/17/22 0547   02/17/22 1000  vancomycin (VANCOREADY) IVPB 1500 mg/300 mL  Status:  Discontinued        1,500 mg 150 mL/hr over 120 Minutes Intravenous Every 24 hours 02/17/22  0547 02/17/22 0844   02/17/22 0600  ceFEPIme (MAXIPIME) 2 g in sodium chloride 0.9 % 100 mL IVPB  Status:  Discontinued        2 g 200 mL/hr over 30 Minutes Intravenous Every 8 hours 02/17/22 0547 02/18/22 1309   02/16/22 1800  ceFEPIme (MAXIPIME) 2 g in sodium chloride 0.9 % 100 mL IVPB  Status:  Discontinued        2 g 200 mL/hr over 30 Minutes Intravenous Every 24 hours 02/15/22 1957 02/17/22 0547   02/15/22 1900  ceFEPIme (MAXIPIME) 2 g in sodium chloride 0.9 % 100 mL IVPB        2 g 200 mL/hr over 30 Minutes Intravenous STAT 02/15/22 1850 02/15/22 1942   02/15/22 1900  vancomycin (VANCOREADY) IVPB 1500 mg/300 mL        1,500 mg 150 mL/hr over 120 Minutes Intravenous STAT 02/15/22 1850 02/15/22 2141   02/15/22 1700  cefTRIAXone (ROCEPHIN) 2 g in sodium chloride 0.9 % 100 mL IVPB  Status:  Discontinued        2 g 200 mL/hr over 30 Minutes Intravenous Every 24 hours 02/15/22 1659 02/15/22 1849        Subjective: Patient seen and examined at bedside.  Speech difficult to comprehend due to aphasia.  No seizures, fever, agitation, vomiting reported.   Objective: Vitals:   02/21/22 0012 02/21/22 0400 02/21/22 0500 02/21/22 0600  BP:  138/74  (!) 176/71  Pulse:  60 60 61  Resp:  20 15 20   Temp: (!) 97.4 F (36.3 C) 98.7 F (37.1 C)    TempSrc: Oral Oral    SpO2:  94% 94% 95%  Weight:   81.2 kg   Height:        Intake/Output Summary (Last 24 hours) at 02/21/2022 0801 Last data filed at 02/21/2022 0521 Gross per 24 hour  Intake 2074.18 ml  Output 1600 ml  Net 474.18 ml    Filed Weights   02/19/22 0500 02/20/22 0216 02/21/22 0500  Weight: 81.1 kg 81.7 kg 81.2 kg    Examination:  General: On room air.  No distress.  Chronically ill and deconditioned looking. ENT/neck: No thyromegaly.  JVD is not elevated  respiratory: Decreased breath sounds at bases bilaterally with some crackles; no wheezing  CVS: S1-S2 heard, rate controlled Abdominal: Soft, nontender, slightly  distended; no organomegaly, bowel sounds are heard.  Has PEG tube.   Extremities: Trace lower extremity edema; no cyanosis  CNS: Awake, has expressive aphasia with right hemiparesis.  Lymph: No obvious lymphadenopathy Skin: No obvious petechiae/rashes psych: Cannot assess because of mental status  musculoskeletal: No obvious joint swelling/deformity    Data Reviewed: I have personally reviewed following labs and imaging studies  CBC: Recent Labs  Lab 02/15/22 1715 02/15/22 2120 02/17/22 0256 02/18/22 0251 02/19/22 0257 02/20/22 0604 02/21/22 0320  WBC 16.8*   < > 14.7* 14.5* 11.2* 9.7 11.6*  NEUTROABS 12.8*  --   --   --   --   --  5.5  HGB 12.1   < > 9.3* 10.3* 10.2* 9.5* 9.9*  HCT 38.7   < > 29.9* 33.7* 31.0* 30.4* 31.2*  MCV 83.4   < > 83.1 86.0 78.5* 82.4 83.0  PLT 176   < > 117* 120* 115* 121* 117*   < > = values in this interval not displayed.  Basic Metabolic Panel: Recent Labs  Lab 02/16/22 0147 02/17/22 0256 02/17/22 1448 02/18/22 0251 02/19/22 0257 02/19/22 1300 02/19/22 1637 02/20/22 0604 02/20/22 1654 02/21/22 0320  NA 143   < > 141 141 142  --   --  139  --  137  K 3.2*   < > 4.8 4.2 4.7  --   --  2.7*  --  3.2*  CL 111   < > 113* 114* 113*  --   --  107  --  105  CO2 18*   < > 23 21* 23  --   --  24  --  24  GLUCOSE 116*   < > 96 91 111*  --   --  111*  --  115*  BUN 45*   < > 29* 23 15  --   --  14  --  16  CREATININE 1.70*   < > 0.79 0.57 0.52  --   --  0.53  --  0.43*  CALCIUM 7.9*   < > 8.1* 8.0* 8.1*  --   --  8.0*  --  7.3*  MG 1.7  --   --  1.9  --  1.8 1.7 1.7 1.7 1.8  PHOS 4.2  --   --   --   --  2.4* 2.2* 2.6 1.6*  --    < > = values in this interval not displayed.    GFR: Estimated Creatinine Clearance: 67.9 mL/min (A) (by C-G formula based on SCr of 0.43 mg/dL (L)). Liver Function Tests: Recent Labs  Lab 02/15/22 1715 02/19/22 0257  AST 29 45*  ALT 20 19  ALKPHOS 74 78  BILITOT 0.7 0.6  PROT 8.2* 7.0  ALBUMIN 2.6* 2.0*     Recent Labs  Lab 02/19/22 0305  LIPASE 22    No results for input(s): "AMMONIA" in the last 168 hours. Coagulation Profile: Recent Labs  Lab 02/15/22 1715  INR 1.6*    Cardiac Enzymes: No results for input(s): "CKTOTAL", "CKMB", "CKMBINDEX", "TROPONINI" in the last 168 hours. BNP (last 3 results) No results for input(s): "PROBNP" in the last 8760 hours. HbA1C: No results for input(s): "HGBA1C" in the last 72 hours. CBG: Recent Labs  Lab 02/20/22 1149 02/20/22 1610 02/20/22 2000 02/21/22 0007 02/21/22 0347  GLUCAP 153* 140* 97 98 117*    Lipid Profile: No results for input(s): "CHOL", "HDL", "LDLCALC", "TRIG", "CHOLHDL", "LDLDIRECT" in the last 72 hours. Thyroid Function Tests: No results for input(s): "TSH", "T4TOTAL", "FREET4", "T3FREE", "THYROIDAB" in the last 72 hours. Anemia Panel: No results for input(s): "VITAMINB12", "FOLATE", "FERRITIN", "TIBC", "IRON", "RETICCTPCT" in the last 72 hours. Sepsis Labs: Recent Labs  Lab 02/15/22 2120 02/16/22 0147 02/16/22 0852 02/16/22 1317 02/17/22 0256 02/17/22 0653  PROCALCITON 1.53 4.60  --   --  5.14  --   LATICACIDVEN 6.9* 5.6* 2.5* 2.7*  --  1.6     Recent Results (from the past 240 hour(s))  Urine Culture     Status: Abnormal   Collection Time: 02/15/22  4:59 PM   Specimen: In/Out Cath Urine  Result Value Ref Range Status   Specimen Description   Final    IN/OUT CATH URINE Performed at [facility], [place] [address] [place], [place] [address]    Special Requests   Final    NONE Performed at [facility], [place] [address] [place], [place] [address]    Culture (A)  Final    >=  100,000 COLONIES/mL KLEBSIELLA PNEUMONIAE 40,000 COLONIES/mL PROTEUS MIRABILIS    Report Status 02/17/2022 FINAL  Final   Organism ID, Bacteria KLEBSIELLA PNEUMONIAE (A)  Final   Organism ID, Bacteria PROTEUS MIRABILIS (A)  Final      Susceptibility   Klebsiella pneumoniae - MIC*     AMPICILLIN RESISTANT Resistant     CEFAZOLIN <=4 SENSITIVE Sensitive     CEFEPIME <=0.12 SENSITIVE Sensitive     CEFTRIAXONE <=0.25 SENSITIVE Sensitive     CIPROFLOXACIN <=0.25 SENSITIVE Sensitive     GENTAMICIN <=1 SENSITIVE Sensitive     IMIPENEM <=0.25 SENSITIVE Sensitive     NITROFURANTOIN 64 INTERMEDIATE Intermediate     TRIMETH/SULFA <=20 SENSITIVE Sensitive     AMPICILLIN/SULBACTAM 4 SENSITIVE Sensitive     PIP/TAZO <=4 SENSITIVE Sensitive     * >=100,000 COLONIES/mL KLEBSIELLA PNEUMONIAE   Proteus mirabilis - MIC*    AMPICILLIN <=2 SENSITIVE Sensitive     CEFAZOLIN <=4 SENSITIVE Sensitive     CEFEPIME <=0.12 SENSITIVE Sensitive     CEFTRIAXONE <=0.25 SENSITIVE Sensitive     CIPROFLOXACIN >=4 RESISTANT Resistant     GENTAMICIN <=1 SENSITIVE Sensitive     IMIPENEM 2 SENSITIVE Sensitive     NITROFURANTOIN 128 RESISTANT Resistant     TRIMETH/SULFA <=20 SENSITIVE Sensitive     AMPICILLIN/SULBACTAM <=2 SENSITIVE Sensitive     PIP/TAZO <=4 SENSITIVE Sensitive     * 40,000 COLONIES/mL PROTEUS MIRABILIS  Blood Culture (routine x 2)     Status: None   Collection Time: 02/15/22  5:04 PM   Specimen: BLOOD  Result Value Ref Range Status   Specimen Description   Final    BLOOD LT HAND Performed at [facility], [address], [place], [place] [address]    Special Requests   Final    BOTTLES DRAWN AEROBIC AND ANAEROBIC BCAV Performed at [facility], [address], [place], [place] [address]    Culture   Final    NO GROWTH 5 DAYS Performed at [facility], [address], [place], [place] [address]    Report Status 02/20/2022 FINAL  Final  Blood Culture (routine x 2)     Status: None   Collection Time: 02/15/22  5:15 PM   Specimen: BLOOD  Result Value Ref Range Status   Specimen Description   Final    BLOOD SITE NOT SPECIFIED Performed at [facility], [address], [place], [place] [address]    Special  Requests   Final    BOTTLES DRAWN AEROBIC AND ANAEROBIC Blood Culture adequate volume Performed at [facility] [address], [place], [place] [address]    Culture   Final    NO GROWTH 5 DAYS Performed at [facility], [address], [place], [place] [address]    Report Status 02/20/2022 FINAL  Final  Resp Panel by RT-PCR (Flu A&B, Covid) Anterior Nasal Swab     Status: None   Collection Time: 02/15/22  6:12 PM   Specimen: Anterior Nasal Swab  Result Value Ref Range Status   SARS Coronavirus 2 by RT PCR NEGATIVE NEGATIVE Final    Comment: (NOTE) SARS-CoV-2 target nucleic acids are NOT DETECTED.  The SARS-CoV-2 RNA is generally detectable in upper respiratory specimens during the acute phase of infection. The lowest concentration of SARS-CoV-2 viral copies this assay can detect is 138 copies/mL. A negative result does not preclude SARS-Cov-2 infection and should not be used  as the sole basis for treatment or other patient management decisions. A negative result may occur with  improper specimen collection/handling, submission of specimen other than nasopharyngeal swab, presence of viral mutation(s) within the areas targeted by this assay, and inadequate number of viral copies(<138 copies/mL). A negative result must be combined with clinical observations, patient history, and epidemiological information. The expected result is Negative.  Fact Sheet for Patients:  BloggerCourse.com  Fact Sheet for Healthcare Providers:  SeriousBroker.it  This test is no t yet approved or cleared by the [place] FDA and  has been authorized for detection and/or diagnosis of SARS-CoV-2 by FDA under an Emergency Use Authorization (EUA). This EUA will remain  in effect (meaning this test can be used) for the duration of the COVID-19 declaration under Section 564(b)(1) of the Act, 21 U.S.C.section 360bbb-3(b)(1),  unless the authorization is terminated  or revoked sooner.       Influenza A by PCR NEGATIVE NEGATIVE Final   Influenza B by PCR NEGATIVE NEGATIVE Final    Comment: (NOTE) The Xpert Xpress SARS-CoV-2/FLU/RSV plus assay is intended as an aid in the diagnosis of influenza from Nasopharyngeal swab specimens and should not be used as a sole basis for treatment. Nasal washings and aspirates are unacceptable for Xpert Xpress SARS-CoV-2/FLU/RSV testing.  Fact Sheet for Patients: BloggerCourse.com  Fact Sheet for Healthcare Providers: SeriousBroker.it  This test is not yet approved or cleared by the [place] FDA and has been authorized for detection and/or diagnosis of SARS-CoV-2 by FDA under an Emergency Use Authorization (EUA). This EUA will remain in effect (meaning this test can be used) for the duration of the COVID-19 declaration under Section 564(b)(1) of the Act, 21 U.S.C. section 360bbb-3(b)(1), unless the authorization is terminated or revoked.  Performed at [facility], [address] [place], [place] [address]   MRSA Next Gen by PCR, Nasal     Status: None   Collection Time: 02/15/22  9:40 PM   Specimen: Nasal Mucosa; Nasal Swab  Result Value Ref Range Status   MRSA by PCR Next Gen NOT DETECTED NOT DETECTED Final    Comment: (NOTE) The GeneXpert MRSA Assay (FDA approved for NASAL specimens only), is one component of a comprehensive MRSA colonization surveillance program. It is not intended to diagnose MRSA infection nor to guide or monitor treatment for MRSA infections. Test performance is not FDA approved in patients less than 48 years old. Performed at [facility], [address] [place], [place] [address]          Radiology Studies: No results found.      Scheduled Meds:  acetaminophen  1,000 mg Per Tube Q8H   amiodarone  200 mg Per Tube BID   [START ON  03/02/2022] amiodarone  200 mg Per Tube Daily   vitamin C  500 mg Per Tube BID   Chlorhexidine Gluconate Cloth  6 each Topical Q0600   feeding supplement (PROSource TF)  45 mL Per Tube TID   free water  100 mL Per Tube Q4H   heparin injection (subcutaneous)  5,000 Units Subcutaneous Q8H   levETIRAcetam  1,000 mg Per Tube BID   levothyroxine  75 mcg Per Tube Q0600   metoprolol tartrate  12.5 mg Per Tube BID   mouth rinse  15 mL Mouth Rinse 4 times per day   pantoprazole sodium  40 mg Per Tube Daily   phenytoin  187.5 mg Per Tube BID   sennosides  10  mL Per Tube BID   valproic acid  500 mg Per Tube QID   zinc sulfate  220 mg Per Tube Daily   Continuous Infusions:  sodium chloride 10 mL/hr at 02/21/22 0521    ceFAZolin (ANCEF) IV Stopped (02/21/22 0118)   feeding supplement (JEVITY 1.2 CAL) 65 mL/hr at 02/20/22 2230   lactated ringers Stopped (02/15/22 2314)          Aline August, MD [facility] 02/21/2022, 8:01 AM

## 2022-02-21 NOTE — Progress Notes (Signed)
SLP Cancellation Note  Patient Details Name: Kathleen Jensen MRN: [id number] DOB: [birth year]   Cancelled treatment:       Reason Eval/Treat Not Completed: Patient declined, no reason specified  Pt refused to participate in oral care and PO trials today. She shook her head "no", and closed her lips tight with attempts to clean her oral cavity or provide PO. Will continue efforts.   Breean Nannini B. Murvin Natal, [facility], CCC-SLP Speech Language Pathologist Office: [phone]  Leigh Aurora 02/21/2022, 10:16 AM

## 2022-02-21 NOTE — Consult Note (Signed)
Consultation Note Date: 02/21/2022   Patient Name: Kathleen Jensen  DOB: April 09, 1951  MRN: [id number]  Age / Sex: 71 y.o., female  PCP: Patient, No Pcp Per Referring Physician: Glade Lloyd, MD  Reason for Consultation: Establishing goals of care  HPI/Patient Profile: 71 y.o. female admitted on 02/15/2022  Clinical Assessment and Goals of Care: 71 year old lady resident of skilled nursing facility, known to palliative medicine service seen in a previous hospitalization for goals of care consultation.  At that time goals were delineated to be for full code/full scope care.  Patient has a MOST form delineating full code and full scope status.  Patient has a PEG tube. Patient has chronic Foley catheter, sacral decubitus ulcer, history of previous stroke with right-sided weakness and expressive aphasia.  She was admitted to the ICU and is currently under hospital medicine service for septic shock present on admission, UTI, chronic indwelling Foley catheter.  Also had acute kidney injury that was treated with IV fluids. Patient is a chronic resident of [address], has history of prior stroke with ongoing residual expressive aphasia and right-sided hemiplegia. Patient has stage IV sacral pressure injury that was present on admission. Palliative medicine consulted for broad goals of care discussions. Chart reviewed, patient seen and examined, arrived at bedside and discussed with patient's daughter Parke Poisson regarding goals of care and CODE STATUS. Palliative medicine is specialized medical care for people living with serious illness. It focuses on providing relief from the symptoms and stress of a serious illness. The goal is to improve quality of life for both the patient and the family. Goals of care: Broad aims of medical therapy in relation to the patient's values and preferences. Our aim is to  provide medical care aimed at enabling patients to achieve the goals that matter most to them, given the circumstances of their particular medical situation and their constraints.     HCPOA Daughter Parke Poisson  SUMMARY OF RECOMMENDATIONS   Full code/full scope care Recommend palliative services at patient's facility Continue tube feedings Continue current mode of care Goals of care discussed with patient's daughter healthcare power of attorney agent Parke Poisson who was present at the bedside.  She endorses continuation of full code/full scope status.  She understands that that the patient has chronic illness and has a high risk of ongoing decline and decompensation in spite of best interventions.  She states that she was advised to consider comfort measures in a previous hospitalization as well however she wishes to continue with full scope care. Thank you for the consult. Code Status/Advance Care Planning: Full code   Symptom Management:     Palliative Prophylaxis:  Delirium Protocol  Additional Recommendations (Limitations, Scope, Preferences): Full Scope Treatment  Psycho-social/Spiritual:  Desire for further Chaplaincy support:yes Additional Recommendations: Caregiving  Support/Resources  Prognosis:  Unable to determine  Discharge Planning: Skilled Nursing Facility for rehab with Palliative care service follow-up      Primary Diagnoses: Present on Admission:  Septic shock (HCC)  UTI (urinary tract infection)  History of cardiac pacemaker in situ  Essential hypertension  Hypothyroidism  Hyperlipidemia   I have reviewed the medical record, interviewed the patient and family, and examined the patient. The following aspects are pertinent.  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 (HCC)    Hyperlipemia    Hypertension    Insomnia    Intracerebral hemorrhage, intraventricular (HCC)    Muscle weakness    Other abnormalities of gait and mobility    Seizures (HCC)    Stroke (HCC)    Unsteadiness on feet    Vitamin B deficiency    Social History   Socioeconomic History   Marital status: Divorced    Spouse name: Not on file   Number of children: 3   Years of education: 60yrs   Highest education level: Not on file  Occupational History   Occupation: Retired    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   Caffeine Use: 1 cup daily   1 dog   Social Determinants of Health   Financial Resource Strain: Not on file  Food Insecurity: Not on file  Transportation Needs: Not on file  Physical Activity: Not on file  Stress: Not on file  Social Connections: Not on file   Family History  Problem Relation Age of Onset   High blood pressure Mother    Diabetes Mother    Diabetes Father    Stroke Father    Cancer Sister        Colorectal cancer   Cancer Brother        colon   Scheduled Meds:  acetaminophen  1,000 mg Per Tube Q8H   amiodarone  200 mg Per Tube BID   [START ON 03/02/2022] amiodarone  200 mg Per Tube Daily   vitamin C  500 mg Per Tube BID   Chlorhexidine Gluconate Cloth  6 each  Topical Q0600   feeding supplement (PROSource TF)  45 mL Per Tube TID   free water  100 mL Per Tube Q4H   heparin injection (subcutaneous)  5,000 Units Subcutaneous Q8H   levETIRAcetam  1,000 mg Per Tube BID   levothyroxine  75 mcg Per Tube Q0600   metoprolol tartrate  12.5 mg Per Tube BID   mouth rinse  15 mL Mouth Rinse 4 times per day   pantoprazole sodium  40 mg Per Tube Daily   phenytoin  187.5 mg Per Tube BID   sennosides  10 mL Per Tube BID   valproic acid  500 mg Per Tube QID   zinc sulfate  220 mg Per Tube Daily   Continuous Infusions:  sodium chloride 10 mL/hr at 02/21/22 0902    ceFAZolin (ANCEF) IV Stopped (02/21/22 0929)   feeding supplement (JEVITY 1.2 CAL) 65 mL/hr at 02/21/22 0902   lactated ringers Stopped (02/15/22 2314)   PRN Meds:.lip balm, morphine injection, mouth rinse, polyethylene glycol, prochlorperazine Medications Prior to Admission:  Prior to Admission medications   Medication Sig Start Date End Date Taking? Authorizing Provider  acetaminophen (TYLENOL) 500 MG tablet Take 1,000 mg by mouth every 8 (eight) hours.   Yes [provider]  docusate (COLACE) 50 MG/5ML liquid Take 100 mg by mouth 2 (two) times daily.   Yes [provider]  folic acid (FOLVITE) 400 MCG tablet Take 400 mcg by mouth daily.   Yes [provider]  levETIRAcetam (KEPPRA) 1000 MG tablet Take 2 tablets (2,000 mg total) by mouth 2 (two) times daily. Patient taking differently: Place 1,000 mg into feeding tube 2 (two) times daily. 04/14/20  Yes Rai, Ripudeep K, MD  levothyroxine (SYNTHROID) 75 MCG tablet Take 75 mcg by mouth daily before breakfast.   Yes [provider]  Lidocaine 4 % PTCH Apply 1 patch topically daily.   Yes [provider]  methocarbamol (ROBAXIN) 500 MG tablet Take 500 mg by mouth 3 (three) times daily. 02/05/22  Yes [provider]  ondansetron (ZOFRAN-ODT) 4 MG disintegrating tablet Take 4 mg by mouth every 8 (eight)  hours as needed for nausea or vomiting. 05/24/21  Yes [provider]  pantoprazole (PROTONIX) 40 MG tablet Take 40 mg by mouth daily. 02/12/22  Yes [provider]  phenytoin (DILANTIN) 125 MG/5ML suspension Take 6 mLs (150 mg total) by mouth 2 (two) times daily. Patient taking differently: Take 187.5 mg by mouth 2 (two) times daily. 7.5 mls BID 06/06/21  Yes Dahal, Melina Schools, MD  Pollen Extracts (PROSTAT PO) Take 30 mLs by mouth in the morning, at noon, and at bedtime.   Yes [provider]  polyethylene glycol (MIRALAX / GLYCOLAX) 17 g packet Take 34 g by mouth 2 (two) times daily. Patient taking differently: Take 34 g by mouth daily. 06/06/21  Yes Dahal, Melina Schools, MD  pregabalin (LYRICA) 75 MG capsule Take 75 mg by mouth 3 (three) times daily.   Yes [provider]  Probiotic Product (PROBIOTIC PO) 1 capsule by Per J Tube route daily.   Yes [provider]  senna-docusate (SENOKOT-S) 8.6-50 MG tablet Take 2 tablets by mouth 2 (two) times daily.   Yes [provider]  Simethicone 80 MG TABS Take 1 tablet by mouth in the morning and at bedtime.   Yes [provider]  simvastatin (ZOCOR) 10 MG tablet Take 10 mg by mouth at bedtime.   Yes [provider]  valproic acid (DEPAKENE) 250 MG/5ML solution Take 500 mg by mouth 4 (four) times daily.   Yes [provider]  bisacodyl (DULCOLAX) 5 MG EC tablet Take 1 tablet (5 mg total) by mouth in the morning and at bedtime. Patient not taking: Reported on 02/15/2022 06/06/21   Lorin Glass, MD  phenytoin (DILANTIN) 125 MG/5ML suspension Take 4 mLs (100 mg total) by mouth daily. Patient not taking: Reported on 02/15/2022 06/06/21   Lorin Glass, MD   Allergies  Allergen Reactions   Tape Rash   Meperidine Hcl Nausea And Vomiting   Sulfonamide Derivatives Itching   Bacitracin-Polymyxin B Dermatitis    "  Cloth Band-Aid only"   Oxycodone Itching   Review of  Systems nonverbal Physical Exam Patient is mostly nonverbal Resting in bed Chronically ill and deconditioned appearing Monitor noted Abdomen not distended Trace lower extremity edema Shallow regular work of breathing  Vital Signs: BP (!) 164/96 (BP Location: Right Arm)   Pulse 60   Temp (!) 97.4 F (36.3 C) (Axillary)   Resp (!) 21   Ht 5\' 5"  (1.651 m)   Wt 81.2 kg   SpO2 95%   BMI 29.79 kg/m  Pain Scale: 0-10   Pain Score: Asleep   SpO2: SpO2: 95 % O2 Device:SpO2: 95 % O2 Flow Rate: .O2 Flow Rate (L/min): 1 L/min  IO: Intake/output summary:  Intake/Output Summary (Last 24 hours) at 02/21/2022 1334 Last data filed at 02/21/2022 1218 Gross per 24 hour  Intake 1960.19 ml  Output 1900 ml  Net 60.19 ml    LBM: Last BM Date : 02/20/22 Baseline Weight: Weight: 78 kg Most recent weight: Weight: 81.2 kg     Palliative Assessment/Data:     Palliative performance scale 40%.  Time In: 1600 Time Out: 1700 Time Total: 60 Greater than 50%  of this time was spent counseling and coordinating care related to the above assessment and plan.  Signed by: Loistine Chance, MD   Please contact Palliative Medicine Team phone at [phone] for questions and concerns.  For individual provider: See Shea Evans

## 2022-02-22 ENCOUNTER — Other Ambulatory Visit (HOSPITAL_COMMUNITY): Payer: Self-pay

## 2022-02-22 DIAGNOSIS — I480 Paroxysmal atrial fibrillation: Secondary | ICD-10-CM | POA: Diagnosis not present

## 2022-02-22 DIAGNOSIS — A419 Sepsis, unspecified organism: Secondary | ICD-10-CM | POA: Diagnosis not present

## 2022-02-22 DIAGNOSIS — I10 Essential (primary) hypertension: Secondary | ICD-10-CM | POA: Diagnosis not present

## 2022-02-22 DIAGNOSIS — Z978 Presence of other specified devices: Secondary | ICD-10-CM | POA: Diagnosis not present

## 2022-02-22 LAB — CBC WITH DIFFERENTIAL/PLATELET
Abs Immature Granulocytes: 0.92 10*3/uL — ABNORMAL HIGH (ref 0.00–0.07)
Basophils Absolute: 0.1 10*3/uL (ref 0.0–0.1)
Basophils Relative: 1 %
Eosinophils Absolute: 0.1 10*3/uL (ref 0.0–0.5)
Eosinophils Relative: 1 %
HCT: 27.9 % — ABNORMAL LOW (ref 36.0–46.0)
Hemoglobin: 8.7 g/dL — ABNORMAL LOW (ref 12.0–15.0)
Immature Granulocytes: 10 %
Lymphocytes Relative: 24 %
Lymphs Abs: 2.2 10*3/uL (ref 0.7–4.0)
MCH: 25.6 pg — ABNORMAL LOW (ref 26.0–34.0)
MCHC: 31.2 g/dL (ref 30.0–36.0)
MCV: 82.1 fL (ref 80.0–100.0)
Monocytes Absolute: 1.3 10*3/uL — ABNORMAL HIGH (ref 0.1–1.0)
Monocytes Relative: 14 %
Neutro Abs: 4.8 10*3/uL (ref 1.7–7.7)
Neutrophils Relative %: 50 %
Platelets: 137 10*3/uL — ABNORMAL LOW (ref 150–400)
RBC: 3.4 MIL/uL — ABNORMAL LOW (ref 3.87–5.11)
RDW: 19 % — ABNORMAL HIGH (ref 11.5–15.5)
WBC: 9.4 10*3/uL (ref 4.0–10.5)
nRBC: 0 % (ref 0.0–0.2)

## 2022-02-22 LAB — BASIC METABOLIC PANEL
Anion gap: 7 (ref 5–15)
BUN: 16 mg/dL (ref 8–23)
CO2: 23 mmol/L (ref 22–32)
Calcium: 7.1 mg/dL — ABNORMAL LOW (ref 8.9–10.3)
Chloride: 107 mmol/L (ref 98–111)
Creatinine, Ser: 0.37 mg/dL — ABNORMAL LOW (ref 0.44–1.00)
GFR, Estimated: >60 mL/min (ref >60)
Glucose, Bld: 114 mg/dL — ABNORMAL HIGH (ref 70–99)
Potassium: 4 mmol/L (ref 3.5–5.1)
Sodium: 137 mmol/L (ref 135–145)

## 2022-02-22 LAB — GLUCOSE, CAPILLARY
Glucose-Capillary: 106 mg/dL — ABNORMAL HIGH (ref 70–99)
Glucose-Capillary: 109 mg/dL — ABNORMAL HIGH (ref 70–99)
Glucose-Capillary: 110 mg/dL — ABNORMAL HIGH (ref 70–99)
Glucose-Capillary: 114 mg/dL — ABNORMAL HIGH (ref 70–99)

## 2022-02-22 LAB — MAGNESIUM: Magnesium: 1.9 mg/dL (ref 1.7–2.4)

## 2022-02-22 MED ORDER — POLYETHYLENE GLYCOL 3350 17 G PO PACK: 34.0000 g | PACK | Freq: Every day | ORAL | Status: DC | PRN

## 2022-02-22 MED ORDER — PROSOURCE TF PO LIQD: 45.0000 mL | Freq: Three times a day (TID) | ORAL | Status: DC

## 2022-02-22 MED ORDER — SIMVASTATIN 10 MG PO TABS: 10.0000 mg | ORAL_TABLET | Freq: Every day | ORAL | Status: DC

## 2022-02-22 MED ORDER — JEVITY 1.2 CAL PO LIQD: 1000.0000 mL | ORAL | 0 refills | Status: DC

## 2022-02-22 MED ORDER — FREE WATER: 100.0000 mL | Status: DC

## 2022-02-22 MED ORDER — METOPROLOL TARTRATE 25 MG/10 ML ORAL SUSPENSION: 12.5000 mg | Freq: Two times a day (BID) | ORAL | 0 refills | Status: DC

## 2022-02-22 MED ORDER — SIMETHICONE 80 MG PO TABS: 1.0000 | ORAL_TABLET | Freq: Two times a day (BID) | ORAL | 0 refills | Status: DC

## 2022-02-22 MED ORDER — AMIODARONE HCL 200 MG PO TABS
ORAL_TABLET | ORAL | 0 refills | Status: DC
  Filled 2022-02-22: qty 30, fill #0

## 2022-02-22 MED ORDER — LEVOTHYROXINE SODIUM 75 MCG PO TABS: 75.0000 ug | ORAL_TABLET | Freq: Every day | ORAL | Status: DC

## 2022-02-22 MED ORDER — PHENYTOIN 125 MG/5ML PO SUSP: 187.5000 mg | Freq: Two times a day (BID) | ORAL | Status: DC

## 2022-02-22 MED ORDER — ASCORBIC ACID 500 MG PO TABS: 500.0000 mg | ORAL_TABLET | Freq: Two times a day (BID) | ORAL | 0 refills | Status: DC

## 2022-02-22 MED ORDER — VALPROIC ACID 250 MG/5ML PO SOLN: 500.0000 mg | Freq: Four times a day (QID) | ORAL | Status: DC

## 2022-02-22 MED ORDER — SENNOSIDES-DOCUSATE SODIUM 8.6-50 MG PO TABS: 2.0000 | ORAL_TABLET | Freq: Two times a day (BID) | ORAL | Status: DC

## 2022-02-22 MED ORDER — ZINC SULFATE 220 (50 ZN) MG PO CAPS: 220.0000 mg | ORAL_CAPSULE | Freq: Every day | ORAL | 0 refills | Status: DC

## 2022-02-22 MED ORDER — METHOCARBAMOL 500 MG PO TABS: 500.0000 mg | ORAL_TABLET | Freq: Three times a day (TID) | ORAL | Status: DC | PRN

## 2022-02-22 MED ORDER — AMIODARONE HCL 200 MG PO TABS: ORAL_TABLET | ORAL | 0 refills | Status: DC

## 2022-02-22 MED ORDER — LEVETIRACETAM 1000 MG PO TABS: 1000.0000 mg | ORAL_TABLET | Freq: Two times a day (BID) | ORAL | Status: DC

## 2022-02-22 NOTE — TOC Transition Note (Signed)
Transition of Care [facility]) - CM/SW Discharge Note   Patient Details  Name: Kathleen Jensen MRN: [id number] Date of Birth: [birth year]  [facility] CM/SW Contact:  Lanier Clam, RN Phone Number: 02/22/2022, 11:04 AM   Clinical Narrative: d/c back to [name] accepted back-going to rm#[id number],report tel#[phone].PTAR called. No further CM needs.      Final next level of care: Long Term Nursing Home Barriers to Discharge: No Barriers Identified   Patient Goals and CMS Choice Patient states their goals for this hospitalization and ongoing recovery are:: return to LTC bed      Discharge Placement              Patient chooses bed at:  ([facility]) Patient to be transferred to facility by:  [name]) Name of family member notified:  ([name](dtr)) Patient and family notified of of transfer: 02/22/22  Discharge Plan and Services In-house Referral: Clinical Social Work              DME Arranged: N/A DME Agency: NA                  Social Determinants of Health (SDOH) Interventions     Readmission Risk Interventions    02/18/2022    2:23 PM  Readmission Risk Prevention Plan  Transportation Screening Complete  PCP or Specialist Appt within 5-7 Days Complete  Home Care Screening Complete  Medication Review (RN CM) Complete

## 2022-02-22 NOTE — Discharge Summary (Addendum)
Physician Discharge Summary  Kathleen Jensen [id number] DOB: May 09, 1951 DOA: 02/15/2022  PCP: Patient, No Pcp Per  Admit date: 02/15/2022 Discharge date: 02/22/2022  Admitted From: LTC Disposition: LTC  Recommendations for Outpatient Follow-up:  Follow up with provider at long-term care facility at earliest convenience with repeat blood work in the next 2 days  Outpatient follow-up with cardiology Outpatient follow-up with palliative care  Recommend outpatient evaluation and follow-up by urology and neurology  follow up in ED if symptoms worsen or new appear   Home Health: No Equipment/Devices: None  Discharge Condition: Stable CODE STATUS: Full Diet recommendation: Heart healthy  Brief/Interim Summary: 71 year old female who is a resident of a skilled nursing facility, chronic Foley catheter, sacral decubitus ulcer, PEG tube for poor p.o. 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  She also developed A-fib with RVR needing amiodarone infusion and cardiology consultation.  She was transferred to [facility] service from 02/18/2022 onwards.  During the hospitalization, she has completed 7-day course of IV antibiotics.  Currently remains afebrile and hemodynamically stable.  Not yet able to participate in SLP evaluation, hence remains NPO.  Currently tolerating tube feeding.  Currently on amiodarone via tube and heart rate remained stable.  Palliative care evaluation appreciated, currently remains full code.  She will be discharged back to her long-term facility today.  Overall prognosis is guarded to poor.  Discharge Diagnoses:   Septic shock: Present on admission UTI: Present on admission, possibly associated with chronic Foley catheter Chronic indwelling Foley catheter -Patient presented with septic shock and initially required ICU admission and pressors.   Currently off of pressors.  Transferred to [facility] service from 02/18/2022 onwards. -Urine culture positive for Klebsiella and Proteus.  Antibiotics have been changed to Ancef.  She has completed 1 week course of IV antibiotics.  No further antibiotics on discharge. -CT abdomen unrevealing -Has required Foley catheter since February 2023.  Will need outpatient urology follow-up. -Hemodynamically improved.  Sepsis/shock is resolved.  Currently afebrile.   Acute kidney injury -Treated with IV fluids.  Resolved.  Leukocytosis -Resolved  Paroxysmal new onset A-fib with RVR -Cardiology evaluation appreciated.  IV amiodarone has been switched to amiodarone via tube.  Cardiology recommended amiodarone 200 mg twice a day for 10 days then 200 mg daily for 21 days.  Continue metoprolol.  Outpatient follow-up with cardiology.  Not a good candidate for anticoagulation because of risk for intracerebral bleeding.  Will need to follow-up with neurology regarding anticoagulation -Currently rate controlled   Hypokalemia -Resolved  Anemia of chronic disease -Hemoglobin stable.  Monitor intermittently   Thrombocytopenia -No signs of bleeding.  Monitor intermittently.   Leukocytosis -Mild.  Monitor intermittently.  Sick sinus syndrome status post pacemaker -Daughter reports that pacemaker battery was recently changed.  Outpatient follow-up with cardiology/EP   Seizure disorder -Continue Keppra, Dilantin, valproic acid.  No seizures since admission.  Outpatient follow-up with neurology.  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 -Daughter reports that PEG tube was placed in February 2023 due to failure to thrive and poor p.o. intake -She said that SNF staff has largely stopped using PEG tube since she is now been eating by mouth -Seen by SLP with recommendations to  continue n.p.o. due to cognitive issues -SLP follow-up and happen as an outpatient at  SNF -Palliative care evaluated the patient.  Currently remains full code.  Outpatient follow-up with palliative care.  Hypophosphatemia -Replaced   Stage IV sacral pressure injury: Present on admission -Follow recommendations from wound care nurse for dressing changes     Hyperlipidemia -Continue statin  Hypothyroidism -Continue Synthroid   Discharge Instructions  Discharge Instructions     Amb Referral to Palliative Care   Complete by: As directed    Ambulatory referral to Cardiology   Complete by: As directed    Ambulatory referral to Neurology   Complete by: As directed    An appointment is requested in approximately: 2 weeks   Discharge wound care:   Complete by: As directed    As per wound care RN's recommendations: Wound care  Daily at 5am      Comments: Wound care to sacral Stage 4 pressure injury (Chronic, nonhealing):  Cleanse with soap and water, rinse and dry. Fill defect with size appropriate piece of silver hydrofiber (Aquacel Ag+ Advantage, Kellie Simmering [phone]), top with dry gauze and secure with silicone foam with tip oriented pointing away from anus. Change daily.      Allergies as of 02/22/2022       Reactions   Tape Rash   Meperidine Hcl Nausea And Vomiting   Sulfonamide Derivatives Itching   Bacitracin-polymyxin B Dermatitis   "Cloth Band-Aid only"   Oxycodone Itching        Medication List     STOP taking these medications    bisacodyl 5 MG EC tablet Commonly known as: DULCOLAX   docusate 50 MG/5ML liquid Commonly known as: COLACE   pregabalin 75 MG capsule Commonly known as: LYRICA   PROSTAT PO       TAKE these medications    acetaminophen 500 MG tablet Commonly known as: TYLENOL Take 1,000 mg by mouth every 8 (eight) hours.   amiodarone 200 MG tablet Commonly known as: PACERONE 200 mg BID for 10 days then 200mg  daily for 21 days. Follow up  with cardiology for further dosing   ascorbic acid 500 MG tablet Commonly known as: VITAMIN C Place 1 tablet (500 mg total) into feeding tube 2 (two) times daily.   feeding supplement (JEVITY 1.2 CAL) Liqd Place 1,000 mLs into feeding tube continuous. At 65ml/hr   feeding supplement (PROSource TF) liquid Place 45 mLs into feeding tube 3 (three) times daily.   folic acid A999333 MCG tablet Commonly known as: FOLVITE Take 400 mcg by mouth daily.   free water Soln Place 100 mLs into feeding tube every 4 (four) hours.   levETIRAcetam 1000 MG tablet Commonly known as: Keppra Place 1 tablet (1,000 mg total) into feeding tube 2 (two) times daily.   levothyroxine 75 MCG tablet Commonly known as: SYNTHROID Place 1 tablet (75 mcg total) into feeding tube daily before breakfast. What changed: how to take this   lidocaine 4 % Apply 1 patch topically daily.   methocarbamol 500 MG tablet Commonly known as: ROBAXIN Take 1 tablet (500 mg total) by mouth every 8 (eight) hours as needed for muscle spasms. What changed:  when to take this reasons to take this   metoprolol tartrate 25 mg/10 mL Susp Commonly known as: LOPRESSOR Place 5 mLs (12.5 mg total) into feeding tube 2 (two) times daily.   ondansetron 4 MG disintegrating tablet Commonly known as: ZOFRAN-ODT Take 4 mg by mouth every 8 (eight) hours as needed for nausea or vomiting.   pantoprazole 40 MG tablet Commonly known as:  PROTONIX Take 40 mg by mouth daily.   phenytoin 125 MG/5ML suspension Commonly known as: DILANTIN Place 7.5 mLs (187.5 mg total) into feeding tube 2 (two) times daily. 7.5 mls BID What changed:  how much to take how to take this additional instructions Another medication with the same name was removed. Continue taking this medication, and follow the directions you see here.   polyethylene glycol 17 g packet Commonly known as: MIRALAX / GLYCOLAX Place 34 g into feeding tube daily as needed for mild  constipation. What changed:  how to take this when to take this reasons to take this   PROBIOTIC PO 1 capsule by Per J Tube route daily.   senna-docusate 8.6-50 MG tablet Commonly known as: Senokot-S Place 2 tablets into feeding tube 2 (two) times daily. What changed: how to take this   Simethicone 80 MG Tabs 1 tablet (80 mg total) by PEG Tube route in the morning and at bedtime. What changed: how to take this   simvastatin 10 MG tablet Commonly known as: ZOCOR Place 1 tablet (10 mg total) into feeding tube at bedtime. What changed: how to take this   valproic acid 250 MG/5ML solution Commonly known as: DEPAKENE Place 10 mLs (500 mg total) into feeding tube 4 (four) times daily. What changed: how to take this   zinc sulfate 220 (50 Zn) MG capsule Place 1 capsule (220 mg total) into feeding tube daily.               Discharge Care Instructions  (From admission, onward)           Start     Ordered   02/22/22 0000  Discharge wound care:       Comments: As per wound care RN's recommendations: Wound care  Daily at 5am      Comments: Wound care to sacral Stage 4 pressure injury (Chronic, nonhealing):  Cleanse with soap and water, rinse and dry. Fill defect with size appropriate piece of silver hydrofiber (Aquacel Ag+ Advantage, Kellie Simmering [phone]), top with dry gauze and secure with silicone foam with tip oriented pointing away from anus. Change daily.   02/22/22 1007              Follow-up Information     [facility]. Schedule an appointment as soon as possible for a visit in 1 week(s).   Contact information: [place] [phone] [phone]               Allergies  Allergen Reactions   Tape Rash   Meperidine Hcl Nausea And Vomiting   Sulfonamide Derivatives Itching   Bacitracin-Polymyxin B Dermatitis    "Cloth Band-Aid only"   Oxycodone Itching    Consultations: Palliative  care/cardiology   Procedures/Studies: CT ABDOMEN PELVIS W CONTRAST  Result Date: 02/17/2022 CLINICAL DATA:  Pain left upper quadrant of abdomen EXAM: CT ABDOMEN AND PELVIS WITH CONTRAST TECHNIQUE: Multidetector CT imaging of the abdomen and pelvis was performed using the standard protocol following bolus administration of intravenous contrast. RADIATION DOSE REDUCTION: This exam was performed according to the departmental dose-optimization program which includes automated exposure control, adjustment of the mA and/or kV according to patient size and/or use of iterative reconstruction technique. CONTRAST:  114mL OMNIPAQUE IOHEXOL 300 MG/ML  SOLN COMPARISON:  05/29/2021 FINDINGS: Lower chest: There are patchy infiltrates in the visualized mid and lower lung fields, more so in the right lower lung field. Minimal bilateral pleural effusions  are seen. Pacemaker leads are noted in place. Coronary artery calcifications are seen. Hepatobiliary: No focal abnormalities are seen in the liver. Surgical clips are seen in gallbladder fossa. Pancreas: No focal abnormalities are seen pancreas. Spleen: Unremarkable. 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 There is no hydronephrosis. There are no renal or ureteral stones. There is 3.9 cm cyst in the midportion of left kidney. Urinary bladder is not distended. Foley catheter is seen in the bladder. There is mild diffuse wall thickening in the bladder. There is mild enhancement in the wall of right renal pelvis. Stomach/Bowel: Surgical staples are seen in the stomach, possibly suggesting previous bariatric surgery. Small hiatal hernia is seen. Small bowel loops are not dilated. Appendix is not distinctly seen. There is moderate to large amount of stool in the lumen of colon. There is interval decrease in amount of stool in right colon. Colon appears less distended. Vascular/Lymphatic: Scattered arterial calcifications are seen. Reproductive: There are  calcified uterine fibroids. Other: There is no pneumoperitoneum. There is minimal ascites. There is a catheter entering the left upper abdomen with its tip in the course of jejunum suggesting jejunostomy catheter. There is no definite demonstrable thick-walled loculated fluid collection in the abdominal wall. There are small pockets of air adjacent to the proximal course of the jejunostomy, possibly within partly collapsed small bowel loop. Musculoskeletal: There is previous surgical fusion at the L4-L5 level. IMPRESSION: There is no evidence of intestinal obstruction or pneumoperitoneum. There is interval decrease in amount of stool in colon and interval decrease in colonic dilation. There is no hydronephrosis. There is mild enhancement in the wall of the right renal pelvis. There is diffuse wall thickening in the urinary bladder. Possibility of UTI is not excluded. Small patchy infiltrates are seen in lower lung fields suggesting atelectasis/pneumonia. Small bilateral pleural effusions. Coronary artery calcifications are seen. Other findings as described in the body of the report. Electronically Signed   By: Elmer Picker M.D.   On: 02/17/2022 13:15   DG Abd 1 View  Result Date: 02/17/2022 CLINICAL DATA:  Abdominal pain. Pain with palpation. EXAM: ABDOMEN - 1 VIEW COMPARISON:  One-view abdomen 09/06/2021 FINDINGS: Chronic elevation of the right hemidiaphragm noted. Pacing and defibrillator wires are stable. Sigmoid colon is mildly dilated. Bowel gas pattern is otherwise unremarkable. The remainder the colon is within normal limits. Lumbar fusion noted. IMPRESSION: Mild sigmoid distention without obstruction. Question focal ileus. No evidence for obstruction. Electronically Signed   By: San Morelle M.D.   On: 02/17/2022 07:08   ECHOCARDIOGRAM COMPLETE  Result Date: 02/16/2022    ECHOCARDIOGRAM REPORT   Patient Name:   PERRY RAGANS Date of Exam: 02/16/2022 Medical Rec #:  [id number]                Height:       65.0 in Accession #:    [id number]              Weight:       183.4 lb Date of Birth:  1950-11-08               BSA:          1.907 m Patient Age:    47 years                BP:           117/88 mmHg Patient Gender: F  HR:           120 bpm. Exam Location:  Inpatient Procedure: 2D Echo, Cardiac Doppler, Color Doppler and Intracardiac            Opacification Agent Indications:    Shock  History:        Patient has prior history of Echocardiogram examinations, most                 recent 12/11/2016. Stroke; Risk Factors:Hypertension, Diabetes                 and Dyslipidemia.  Sonographer:    Louie Boston RDCS Referring Phys: [id number] ROBERT S BYRUM IMPRESSIONS  1. Left ventricular ejection fraction, by estimation, is 55 to 60%. The left ventricle has normal function. The left ventricle has no regional wall motion abnormalities. There is mild concentric left ventricular hypertrophy. Left ventricular diastolic parameters are indeterminate.  2. Right ventricular systolic function is hyperdynamic. The right ventricular size is normal. There is normal pulmonary artery systolic pressure. The estimated right ventricular systolic pressure is 26.6 mmHg.  3. The mitral valve is grossly normal. No evidence of mitral valve regurgitation. No evidence of mitral stenosis.  4. The aortic valve is tricuspid. There is mild calcification of the aortic valve. There is mild thickening of the aortic valve. Aortic valve regurgitation is not visualized. Aortic valve sclerosis is present, with no evidence of aortic valve stenosis.  5. The inferior vena cava is normal in size with greater than 50% respiratory variability, suggesting right atrial pressure of 3 mmHg. Comparison(s): No prior Echocardiogram. FINDINGS  Left Ventricle: Left ventricular ejection fraction, by estimation, is 55 to 60%. The left ventricle has normal function. The left ventricle has no regional wall motion abnormalities. Definity  contrast agent was given IV to delineate the left ventricular  endocardial borders. The left ventricular internal cavity size was normal in size. There is mild concentric left ventricular hypertrophy. Left ventricular diastolic parameters are indeterminate. Right Ventricle: The right ventricular size is normal. No increase in right ventricular wall thickness. Right ventricular systolic function is hyperdynamic. There is normal pulmonary artery systolic pressure. The tricuspid regurgitant velocity is 2.43 m/s, and with an assumed right atrial pressure of 3 mmHg, the estimated right ventricular systolic pressure is 26.6 mmHg. Left Atrium: Left atrial size was normal in size. Right Atrium: Right atrial size was normal in size. Pericardium: Trivial pericardial effusion is present. Mitral Valve: The mitral valve is grossly normal. No evidence of mitral valve regurgitation. No evidence of mitral valve stenosis. Tricuspid Valve: The tricuspid valve is normal in structure. Tricuspid valve regurgitation is mild . No evidence of tricuspid stenosis. Aortic Valve: The aortic valve is tricuspid. There is mild calcification of the aortic valve. There is mild thickening of the aortic valve. Aortic valve regurgitation is not visualized. Aortic valve sclerosis is present, with no evidence of aortic valve stenosis. Pulmonic Valve: The pulmonic valve was normal in structure. Pulmonic valve regurgitation is not visualized. No evidence of pulmonic stenosis. Aorta: The aortic root and ascending aorta are structurally normal, with no evidence of dilitation. Venous: The inferior vena cava is normal in size with greater than 50% respiratory variability, suggesting right atrial pressure of 3 mmHg. IAS/Shunts: No atrial level shunt detected by color flow Doppler. Additional Comments: A device lead is visualized in the right atrium and right ventricle.  LEFT VENTRICLE PLAX 2D LVIDd:         4.50 cm   Diastology LVIDs:  3.20 cm   LV e'  medial:    10.10 cm/s LV PW:         1.10 cm   LV E/e' medial:  10.7 LV IVS:        1.10 cm   LV e' lateral:   12.70 cm/s LVOT diam:     2.00 cm   LV E/e' lateral: 8.5 LV SV:         36 LV SV Index:   19 LVOT Area:     3.14 cm  RIGHT VENTRICLE             IVC RV S prime:     11.30 cm/s  IVC diam: 1.60 cm TAPSE (M-mode): 1.3 cm LEFT ATRIUM             Index        RIGHT ATRIUM          Index LA diam:        3.90 cm 2.05 cm/m   RA Area:     9.88 cm LA Vol (A2C):   53.8 ml 28.22 ml/m  RA Volume:   18.40 ml 9.65 ml/m LA Vol (A4C):   50.9 ml 26.70 ml/m LA Biplane Vol: 53.9 ml 28.27 ml/m  AORTIC VALVE LVOT Vmax:   86.60 cm/s LVOT Vmean:  56.300 cm/s LVOT VTI:    0.116 m  AORTA Ao Root diam: 2.80 cm Ao Asc diam:  3.65 cm Ao Desc diam: 1.60 cm MITRAL VALVE                TRICUSPID VALVE MV Area (PHT): 8.34 cm     TR Peak grad:   23.6 mmHg MV Decel Time: 91 msec      TR Vmax:        243.00 cm/s MV E velocity: 108.15 cm/s MV A velocity: 57.25 cm/s   SHUNTS MV E/A ratio:  1.89         Systemic VTI:  0.12 m                             Systemic Diam: 2.00 cm Rudean Haskell MD Electronically signed by Rudean Haskell MD Signature Date/Time: 02/16/2022/1:40:58 PM    Final    US RENAL  Result Date: 02/16/2022 CLINICAL DATA:  Sepsis. EXAM: RENAL / URINARY TRACT ULTRASOUND COMPLETE COMPARISON:  08/27/2018 FINDINGS: Suboptimal images due to patient body habitus as well as difficult breath-holding and immobility. Right Kidney: Renal measurements: 11.7 x 5.8 x 5.6 cm = volume: 199 mL. Echogenicity within normal limits. No mass or hydronephrosis visualized. Left Kidney: Renal measurements: 8.8 x 3.9 x 7.1 cm = volume: 126 mL. Echogenicity within normal limits. No subtle mass or hydronephrosis visualized. 4.7 cm cyst over the upper pole. Bladder: Foley present within a decompressed bladder. Other: None. IMPRESSION: 1.  Normal size kidneys without hydronephrosis. 2.  4.7 cm left renal cyst. Electronically Signed   By:  Marin Olp M.D.   On: 02/16/2022 13:07   CT HEAD WO CONTRAST (5MM)  Result Date: 02/15/2022 CLINICAL DATA:  Sepsis altered EXAM: CT HEAD WITHOUT CONTRAST TECHNIQUE: Contiguous axial images were obtained from the base of the skull through the vertex without intravenous contrast. RADIATION DOSE REDUCTION: This exam was performed according to the departmental dose-optimization program which includes automated exposure control, adjustment of the mA and/or kV according to patient size and/or use of iterative reconstruction technique. COMPARISON:  CT brain 05/28/2021  FINDINGS: Brain: No acute territorial infarction, hemorrhage or intracranial mass. Atrophy and chronic small vessel ischemic changes of the white matter. Chronic left parietal infarct. Chronic right thalamic and left cerebellar infarcts. Stable ventricle size. Vascular: No hyperdense vessels.  Carotid vascular calcification Skull: Normal. Negative for fracture or focal lesion. Sinuses/Orbits: No acute finding. Other: None IMPRESSION: 1. No CT evidence for acute intracranial abnormality. 2. Atrophy and chronic small vessel ischemic changes of the white matter. Multiple chronic infarcts Electronically Signed   By: Jasmine Pang M.D.   On: 02/15/2022 20:28   DG Chest Port 1 View  Result Date: 02/15/2022 CLINICAL DATA:  Questionable sepsis EXAM: PORTABLE CHEST 1 VIEW COMPARISON:  05/29/2021 FINDINGS: Heart size is normal. Left chest multi lead pacer. Unchanged elevation of the right hemidiaphragm. Both lungs are clear. The visualized skeletal structures are unremarkable. IMPRESSION: No acute abnormality of the lungs in AP portable projection. Electronically Signed   By: Jearld Lesch M.D.   On: 02/15/2022 18:16      Subjective: Patient seen and examined at bedside.  Nods her head to some questions.  Poor historian, aphasic.  No overnight fever, agitation, seizures reported.  Discharge Exam: Vitals:   02/22/22 0651 02/22/22 0700  BP: (!)  144/83   Pulse: 60   Resp: 20 20  Temp: 97.6 F (36.4 C)   SpO2: 93%     General: Pt is awake, not in acute distress.  Looks chronically ill and deconditioned.  Currently on room air.  Has expressive aphasia and right hemiparesis. Cardiovascular: rate controlled, S1/S2 + Respiratory: bilateral decreased breath sounds at bases with some scattered crackles Abdominal: Soft, NT, distended slightly, bowel sounds +.  PEG tube present. Extremities: Trace lower extremity edema; no cyanosis    The results of significant diagnostics from this hospitalization (including imaging, microbiology, ancillary and laboratory) are listed below for reference.     Microbiology: Recent Results (from the past 240 hour(s))  Urine Culture     Status: Abnormal   Collection Time: 02/15/22  4:59 PM   Specimen: In/Out Cath Urine  Result Value Ref Range Status   Specimen Description   Final    IN/OUT CATH URINE Performed at [facility], [address], [place], [place] [address]    Special Requests   Final    NONE Performed at [facility], [address], [place], [place] [address]    Culture (A)  Final    >=100,000 COLONIES/mL KLEBSIELLA PNEUMONIAE 40,000 COLONIES/mL PROTEUS MIRABILIS    Report Status 02/17/2022 FINAL  Final   Organism ID, Bacteria KLEBSIELLA PNEUMONIAE (A)  Final   Organism ID, Bacteria PROTEUS MIRABILIS (A)  Final      Susceptibility   Klebsiella pneumoniae - MIC*    AMPICILLIN RESISTANT Resistant     CEFAZOLIN <=4 SENSITIVE Sensitive     CEFEPIME <=0.12 SENSITIVE Sensitive     CEFTRIAXONE <=0.25 SENSITIVE Sensitive     CIPROFLOXACIN <=0.25 SENSITIVE Sensitive     GENTAMICIN <=1 SENSITIVE Sensitive     IMIPENEM <=0.25 SENSITIVE Sensitive     NITROFURANTOIN 64 INTERMEDIATE Intermediate     TRIMETH/SULFA <=20 SENSITIVE Sensitive     AMPICILLIN/SULBACTAM 4 SENSITIVE Sensitive     PIP/TAZO <=4 SENSITIVE Sensitive     * >=100,000  COLONIES/mL KLEBSIELLA PNEUMONIAE   Proteus mirabilis - MIC*    AMPICILLIN <=2 SENSITIVE Sensitive     CEFAZOLIN <=4 SENSITIVE Sensitive     CEFEPIME <=0.12 SENSITIVE Sensitive  CEFTRIAXONE <=0.25 SENSITIVE Sensitive     CIPROFLOXACIN >=4 RESISTANT Resistant     GENTAMICIN <=1 SENSITIVE Sensitive     IMIPENEM 2 SENSITIVE Sensitive     NITROFURANTOIN 128 RESISTANT Resistant     TRIMETH/SULFA <=20 SENSITIVE Sensitive     AMPICILLIN/SULBACTAM <=2 SENSITIVE Sensitive     PIP/TAZO <=4 SENSITIVE Sensitive     * 40,000 COLONIES/mL PROTEUS MIRABILIS  Blood Culture (routine x 2)     Status: None   Collection Time: 02/15/22  5:04 PM   Specimen: BLOOD  Result Value Ref Range Status   Specimen Description   Final    BLOOD LT HAND Performed at [facility] [address], [place], [place] [address]    Special Requests   Final    BOTTLES DRAWN AEROBIC AND ANAEROBIC BCAV Performed at [facility], [place] [address] [place], [place] [address]    Culture   Final    NO GROWTH 5 DAYS Performed at [facility], [place] [address], [place],  [place] [address]    Report Status 02/20/2022 FINAL  Final  Blood Culture (routine x 2)     Status: None   Collection Time: 02/15/22  5:15 PM   Specimen: BLOOD  Result Value Ref Range Status   Specimen Description   Final    BLOOD SITE NOT SPECIFIED Performed at [place] [address], [place], [place] [address]    Special Requests   Final    BOTTLES DRAWN AEROBIC AND ANAEROBIC Blood Culture adequate volume Performed at [place] [address] [place], [place] [address]    Culture   Final    NO GROWTH 5 DAYS Performed at [facility], [place] [address], [place], [place] [address]    Report Status 02/20/2022 FINAL  Final  Resp Panel by RT-PCR (Flu A&B, Covid) Anterior Nasal Swab     Status: None   Collection Time: 02/15/22  6:12 PM   Specimen: Anterior Nasal  Swab  Result Value Ref Range Status   SARS Coronavirus 2 by RT PCR NEGATIVE NEGATIVE Final    Comment: (NOTE) SARS-CoV-2 target nucleic acids are NOT DETECTED.  The SARS-CoV-2 RNA is generally detectable in upper respiratory specimens during the acute phase of infection. The lowest concentration of SARS-CoV-2 viral copies this assay can detect is 138 copies/mL. A negative result does not preclude SARS-Cov-2 infection and should not be used as the sole basis for treatment or other patient management decisions. A negative result may occur with  improper specimen collection/handling, submission of specimen other than nasopharyngeal swab, presence of viral mutation(s) within the areas targeted by this assay, and inadequate number of viral copies(<138 copies/mL). A negative result must be combined with clinical observations, patient history, and epidemiological information. The expected result is Negative.  Fact Sheet for Patients:  EntrepreneurPulse.com.au  Fact Sheet for Healthcare Providers:  IncredibleEmployment.be  This test is no t yet approved or cleared by the [place] FDA and  has been authorized for detection and/or diagnosis of SARS-CoV-2 by FDA under an Emergency Use Authorization (EUA). This EUA will remain  in effect (meaning this test can be used) for the duration of the COVID-19 declaration under Section 564(b)(1) of the Act, 21 U.S.C.section 360bbb-3(b)(1), unless the authorization is terminated  or revoked sooner.       Influenza A by PCR NEGATIVE NEGATIVE Final   Influenza B by PCR NEGATIVE NEGATIVE Final    Comment: (  NOTE) The Xpert Xpress SARS-CoV-2/FLU/RSV plus assay is intended as an aid in the diagnosis of influenza from Nasopharyngeal swab specimens and should not be used as a sole basis for treatment. Nasal washings and aspirates are unacceptable for Xpert Xpress SARS-CoV-2/FLU/RSV testing.  Fact Sheet for  Patients: EntrepreneurPulse.com.au  Fact Sheet for Healthcare Providers: IncredibleEmployment.be  This test is not yet approved or cleared by the [place] FDA and has been authorized for detection and/or diagnosis of SARS-CoV-2 by FDA under an Emergency Use Authorization (EUA). This EUA will remain in effect (meaning this test can be used) for the duration of the COVID-19 declaration under Section 564(b)(1) of the Act, 21 U.S.C. section 360bbb-3(b)(1), unless the authorization is terminated or revoked.  Performed at [facility], [place] [address] [place] [address] [place], [place] [address]   MRSA Next Gen by PCR, Nasal     Status: None   Collection Time: 02/15/22  9:40 PM   Specimen: Nasal Mucosa; Nasal Swab  Result Value Ref Range Status   MRSA by PCR Next Gen NOT DETECTED NOT DETECTED Final    Comment: (NOTE) The GeneXpert MRSA Assay (FDA approved for NASAL specimens only), is one component of a comprehensive MRSA colonization surveillance program. It is not intended to diagnose MRSA infection nor to guide or monitor treatment for MRSA infections. Test performance is not FDA approved in patients less than 27 years old. Performed at [facility], [place] [address] [place], [place] [address]      Labs: BNP (last 3 results) Recent Labs    02/15/22 2120  BNP 123456*   Basic Metabolic Panel: Recent Labs  Lab 02/16/22 0147 02/17/22 0256 02/18/22 0251 02/19/22 0257 02/19/22 1300 02/19/22 1637 02/20/22 0604 02/20/22 1654 02/21/22 0320 02/22/22 0516  NA 143   < > 141 142  --   --  139  --  137 137  K 3.2*   < > 4.2 4.7  --   --  2.7*  --  3.2* 4.0  CL 111   < > 114* 113*  --   --  107  --  105 107  CO2 18*   < > 21* 23  --   --  24  --  24 23  GLUCOSE 116*   < > 91 111*  --   --  111*  --  115* 114*  BUN 45*   < > 23 15  --   --  14  --  16 16  CREATININE 1.70*   < > 0.57 0.52  --   --  0.53  --  0.43* 0.37*   CALCIUM 7.9*   < > 8.0* 8.1*  --   --  8.0*  --  7.3* 7.1*  MG 1.7  --  1.9  --  1.8 1.7 1.7 1.7 1.8 1.9  PHOS 4.2  --   --   --  2.4* 2.2* 2.6 1.6*  --   --    < > = values in this interval not displayed.   Liver Function Tests: Recent Labs  Lab 02/15/22 1715 02/19/22 0257  AST 29 45*  ALT 20 19  ALKPHOS 74 78  BILITOT 0.7 0.6  PROT 8.2* 7.0  ALBUMIN 2.6* 2.0*   Recent Labs  Lab 02/19/22 0305  LIPASE 22   No results for input(s): "AMMONIA" in the last 168 hours. CBC: Recent Labs  Lab 02/15/22 1715 02/15/22 2120 02/18/22 0251 02/19/22 0257 02/20/22 0604 02/21/22 0320 02/22/22 [id number]  WBC 16.8*   < > 14.5* 11.2* 9.7 11.6* 9.4  NEUTROABS 12.8*  --   --   --   --  5.5 4.8  HGB 12.1   < > 10.3* 10.2* 9.5* 9.9* 8.7*  HCT 38.7   < > 33.7* 31.0* 30.4* 31.2* 27.9*  MCV 83.4   < > 86.0 78.5* 82.4 83.0 82.1  PLT 176   < > 120* 115* 121* 117* 137*   < > = values in this interval not displayed.   Cardiac Enzymes: No results for input(s): "CKTOTAL", "CKMB", "CKMBINDEX", "TROPONINI" in the last 168 hours. BNP: Invalid input(s): "POCBNP" CBG: Recent Labs  Lab 02/21/22 1530 02/21/22 2017 02/22/22 0020 02/22/22 0422 02/22/22 0732  GLUCAP 115* 90 106* 110* 109*   D-Dimer No results for input(s): "DDIMER" in the last 72 hours. Hgb A1c No results for input(s): "HGBA1C" in the last 72 hours. Lipid Profile No results for input(s): "CHOL", "HDL", "LDLCALC", "TRIG", "CHOLHDL", "LDLDIRECT" in the last 72 hours. Thyroid function studies No results for input(s): "TSH", "T4TOTAL", "T3FREE", "THYROIDAB" in the last 72 hours.  Invalid input(s): "FREET3" Anemia work up No results for input(s): "VITAMINB12", "FOLATE", "FERRITIN", "TIBC", "IRON", "RETICCTPCT" in the last 72 hours. Urinalysis    Component Value Date/Time   COLORURINE RED (A) 02/17/2022 0121   APPEARANCEUR CLOUDY (A) 02/17/2022 0121   LABSPEC 1.025 02/17/2022 0121   PHURINE 5.0 02/17/2022 0121   GLUCOSEU  NEGATIVE 02/17/2022 0121   HGBUR LARGE (A) 02/17/2022 0121   BILIRUBINUR NEGATIVE 02/17/2022 0121   BILIRUBINUR 1+ 11/19/2016 1307   KETONESUR NEGATIVE 02/17/2022 0121   PROTEINUR 100 (A) 02/17/2022 0121   UROBILINOGEN 1.0 11/19/2016 1307   NITRITE NEGATIVE 02/17/2022 0121   LEUKOCYTESUR MODERATE (A) 02/17/2022 0121   Sepsis Labs Recent Labs  Lab 02/19/22 0257 02/20/22 0604 02/21/22 0320 02/22/22 0516  WBC 11.2* 9.7 11.6* 9.4   Microbiology Recent Results (from the past 240 hour(s))  Urine Culture     Status: Abnormal   Collection Time: 02/15/22  4:59 PM   Specimen: In/Out Cath Urine  Result Value Ref Range Status   Specimen Description   Final    IN/OUT CATH URINE Performed at [facility], [place] [address] [place], [place] [address]    Special Requests   Final    NONE Performed at [facility], [place] [address], [place], [place] [address]    Culture (A)  Final    >=100,000 COLONIES/mL KLEBSIELLA PNEUMONIAE 40,000 COLONIES/mL PROTEUS MIRABILIS    Report Status 02/17/2022 FINAL  Final   Organism ID, Bacteria KLEBSIELLA PNEUMONIAE (A)  Final   Organism ID, Bacteria PROTEUS MIRABILIS (A)  Final      Susceptibility   Klebsiella pneumoniae - MIC*    AMPICILLIN RESISTANT Resistant     CEFAZOLIN <=4 SENSITIVE Sensitive     CEFEPIME <=0.12 SENSITIVE Sensitive     CEFTRIAXONE <=0.25 SENSITIVE Sensitive     CIPROFLOXACIN <=0.25 SENSITIVE Sensitive     GENTAMICIN <=1 SENSITIVE Sensitive     IMIPENEM <=0.25 SENSITIVE Sensitive     NITROFURANTOIN 64 INTERMEDIATE Intermediate     TRIMETH/SULFA <=20 SENSITIVE Sensitive     AMPICILLIN/SULBACTAM 4 SENSITIVE Sensitive     PIP/TAZO <=4 SENSITIVE Sensitive     * >=100,000 COLONIES/mL KLEBSIELLA PNEUMONIAE   Proteus mirabilis - MIC*    AMPICILLIN <=2 SENSITIVE Sensitive     CEFAZOLIN <=4 SENSITIVE Sensitive     CEFEPIME <=0.12 SENSITIVE Sensitive  CEFTRIAXONE <=0.25 SENSITIVE Sensitive      CIPROFLOXACIN >=4 RESISTANT Resistant     GENTAMICIN <=1 SENSITIVE Sensitive     IMIPENEM 2 SENSITIVE Sensitive     NITROFURANTOIN 128 RESISTANT Resistant     TRIMETH/SULFA <=20 SENSITIVE Sensitive     AMPICILLIN/SULBACTAM <=2 SENSITIVE Sensitive     PIP/TAZO <=4 SENSITIVE Sensitive     * 40,000 COLONIES/mL PROTEUS MIRABILIS  Blood Culture (routine x 2)     Status: None   Collection Time: 02/15/22  5:04 PM   Specimen: BLOOD  Result Value Ref Range Status   Specimen Description   Final    BLOOD LT HAND Performed at [place] [address] [place], [place] [address]    Special Requests   Final    BOTTLES DRAWN AEROBIC AND ANAEROBIC BCAV Performed at [facility], [place] [address], [place], [place] [address]    Culture   Final    NO GROWTH 5 DAYS Performed at [facility], [place] [address], [place], [place] [address]    Report Status 02/20/2022 FINAL  Final  Blood Culture (routine x 2)     Status: None   Collection Time: 02/15/22  5:15 PM   Specimen: BLOOD  Result Value Ref Range Status   Specimen Description   Final    BLOOD SITE NOT SPECIFIED Performed at [place] [address], [place], [place] [address]    Special Requests   Final    BOTTLES DRAWN AEROBIC AND ANAEROBIC Blood Culture adequate volume Performed at [place] [address] [place], [place] [address]    Culture   Final    NO GROWTH 5 DAYS Performed at [facility], [place] [address] [name], [place] [address]    Report Status 02/20/2022 FINAL  Final  Resp Panel by RT-PCR (Flu A&B, Covid) Anterior Nasal Swab     Status: None   Collection Time: 02/15/22  6:12 PM   Specimen: Anterior Nasal Swab  Result Value Ref Range Status   SARS Coronavirus 2 by RT PCR NEGATIVE NEGATIVE Final    Comment: (NOTE) SARS-CoV-2 target nucleic acids are NOT DETECTED.  The SARS-CoV-2 RNA is generally detectable in upper  respiratory specimens during the acute phase of infection. The lowest concentration of SARS-CoV-2 viral copies this assay can detect is 138 copies/mL. A negative result does not preclude SARS-Cov-2 infection and should not be used as the sole basis for treatment or other patient management decisions. A negative result may occur with  improper specimen collection/handling, submission of specimen other than nasopharyngeal swab, presence of viral mutation(s) within the areas targeted by this assay, and inadequate number of viral copies(<138 copies/mL). A negative result must be combined with clinical observations, patient history, and epidemiological information. The expected result is Negative.  Fact Sheet for Patients:  EntrepreneurPulse.com.au  Fact Sheet for Healthcare Providers:  IncredibleEmployment.be  This test is no t yet approved or cleared by the [place] FDA and  has been authorized for detection and/or diagnosis of SARS-CoV-2 by FDA under an Emergency Use Authorization (EUA). This EUA will remain  in effect (meaning this test can be used) for the duration of the COVID-19 declaration under Section 564(b)(1) of the Act, 21 U.S.C.section 360bbb-3(b)(1), unless the authorization is terminated  or revoked sooner.       Influenza A by PCR NEGATIVE NEGATIVE Final   Influenza B by PCR NEGATIVE NEGATIVE Final  Comment: (NOTE) The Xpert Xpress SARS-CoV-2/FLU/RSV plus assay is intended as an aid in the diagnosis of influenza from Nasopharyngeal swab specimens and should not be used as a sole basis for treatment. Nasal washings and aspirates are unacceptable for Xpert Xpress SARS-CoV-2/FLU/RSV testing.  Fact Sheet for Patients: EntrepreneurPulse.com.au  Fact Sheet for Healthcare Providers: IncredibleEmployment.be  This test is not yet approved or cleared by the [place] FDA and has been  authorized for detection and/or diagnosis of SARS-CoV-2 by FDA under an Emergency Use Authorization (EUA). This EUA will remain in effect (meaning this test can be used) for the duration of the COVID-19 declaration under Section 564(b)(1) of the Act, 21 U.S.C. section 360bbb-3(b)(1), unless the authorization is terminated or revoked.  Performed at [facility], [place] [address], [place], [place] [address]   MRSA Next Gen by PCR, Nasal     Status: None   Collection Time: 02/15/22  9:40 PM   Specimen: Nasal Mucosa; Nasal Swab  Result Value Ref Range Status   MRSA by PCR Next Gen NOT DETECTED NOT DETECTED Final    Comment: (NOTE) The GeneXpert MRSA Assay (FDA approved for NASAL specimens only), is one component of a comprehensive MRSA colonization surveillance program. It is not intended to diagnose MRSA infection nor to guide or monitor treatment for MRSA infections. Test performance is not FDA approved in patients less than 44 years old. Performed at [facility], [place] [address] [place], [place] [address]      Time coordinating discharge: 35 minutes  SIGNED:   Aline August, MD  [facility] 02/22/2022, 10:11 AM

## 2022-02-22 NOTE — TOC Progression Note (Signed)
Transition of Care [facility]) - Progression Note    Patient Details  Name: Kathleen Jensen MRN: [id number] Date of Birth: April 21, 1951  [facility]) CM/SW Contact  Kosisochukwu Goldberg, Olegario Messier, RN Phone Number: 02/22/2022, 9:24 AM  Clinical Narrative: Awaiting response from [address] rep if can return back to LTC.      Expected Discharge Plan: Skilled Nursing Facility Barriers to Discharge: Continued Medical Work up  Expected Discharge Plan and Services Expected Discharge Plan: Skilled Nursing Facility In-house Referral: Clinical Social Work     Living arrangements for the past 2 months: Skilled Nursing Facility                 DME Arranged: N/A DME Agency: NA                   Social Determinants of Health (SDOH) Interventions    Readmission Risk Interventions    02/18/2022    2:23 PM  Readmission Risk Prevention Plan  Transportation Screening Complete  PCP or Specialist Appt within 5-7 Days Complete  Home Care Screening Complete  Medication Review (RN CM) Complete

## 2022-02-22 NOTE — Care Management Important Message (Signed)
Important Message  Patient Details IM Letter placed in Patients room. Name: Panagiota Perfetti MRN: [id number] Date of Birth: May 24, 1951   Medicare Important Message Given:  Yes     Caren Macadam 02/22/2022, 12:06 PM

## 2022-02-22 NOTE — Progress Notes (Signed)
SLP Cancellation Note  Patient Details Name: Kathleen Jensen MRN: [id number] DOB: 11/20/1950   Cancelled treatment:       Reason Eval/Treat Not Completed: Other (comment) (Pt to dc today to SNF and currently is moaning in room with RN present; Recommend follow up at SNF) Rolena Infante, MS [facility] SLP Acute Rehab Services Office [phone] Pager [phone]   Chales Abrahams 02/22/2022, 10:53 AM

## 2022-02-22 NOTE — Progress Notes (Addendum)
Civil engineer, contracting [facility]) Hospital Liaison note:  Notified via Epic workque by Dr. Glade Lloyd of request for [facility] services. Will continue to follow for disposition.  Please call with any outpatient palliative questions or concerns.  Thank you for the opportunity to participate in this patient's care.  1540 - Cheri from [facility] called to inform [facility] that this patient has been enrolled in their services since 11.15.22.   Thank you, Abran Cantor, LPN [facility] Liaison [phone]

## 2022-02-22 NOTE — Progress Notes (Signed)
Report called to [place] at [facility]. The RN reports being familiar with the patients history. Updates provided regarding course of treatment, wound and foley. Questions, concerns were denied.

## 2022-02-22 NOTE — Progress Notes (Signed)
[facility] arrived to transport the patient. No change from am assessment. Pt is stable at this time.Peg is patent Wound changed. The pt is nonverbal, bed bound, 2 assist. The patients daughter Kathleen Jensen) was contacted to update regarding transport.

## 2022-02-26 ENCOUNTER — Emergency Department (HOSPITAL_COMMUNITY)
Admission: EM | Admit: 2022-02-26 | Discharge: 2022-02-27 | Disposition: A | Discharge status: Home / Self Care | Payer: Medicare HMO | Attending: Emergency Medicine | Admitting: Emergency Medicine

## 2022-02-26 ENCOUNTER — Other Ambulatory Visit: Payer: Self-pay

## 2022-02-26 DIAGNOSIS — Z79899 Other long term (current) drug therapy: Secondary | ICD-10-CM | POA: Insufficient documentation

## 2022-02-26 DIAGNOSIS — I10 Essential (primary) hypertension: Secondary | ICD-10-CM | POA: Insufficient documentation

## 2022-02-26 DIAGNOSIS — I82622 Acute embolism and thrombosis of deep veins of left upper extremity: Secondary | ICD-10-CM | POA: Insufficient documentation

## 2022-02-26 LAB — CBC WITH DIFFERENTIAL/PLATELET
Abs Immature Granulocytes: 0.12 10*3/uL — ABNORMAL HIGH (ref 0.00–0.07)
Basophils Absolute: 0 10*3/uL (ref 0.0–0.1)
Basophils Relative: 0 %
Eosinophils Absolute: 0.1 10*3/uL (ref 0.0–0.5)
Eosinophils Relative: 1 %
HCT: 31.8 % — ABNORMAL LOW (ref 36.0–46.0)
Hemoglobin: 10 g/dL — ABNORMAL LOW (ref 12.0–15.0)
Immature Granulocytes: 1 %
Lymphocytes Relative: 30 %
Lymphs Abs: 3.9 10*3/uL (ref 0.7–4.0)
MCH: 25.8 pg — ABNORMAL LOW (ref 26.0–34.0)
MCHC: 31.4 g/dL (ref 30.0–36.0)
MCV: 82.2 fL (ref 80.0–100.0)
Monocytes Absolute: 1.2 10*3/uL — ABNORMAL HIGH (ref 0.1–1.0)
Monocytes Relative: 9 %
Neutro Abs: 7.8 10*3/uL — ABNORMAL HIGH (ref 1.7–7.7)
Neutrophils Relative %: 59 %
Platelets: 355 10*3/uL (ref 150–400)
RBC: 3.87 MIL/uL (ref 3.87–5.11)
RDW: 20.9 % — ABNORMAL HIGH (ref 11.5–15.5)
WBC: 13.2 10*3/uL — ABNORMAL HIGH (ref 4.0–10.5)
nRBC: 0 % (ref 0.0–0.2)

## 2022-02-26 LAB — BASIC METABOLIC PANEL
Anion gap: 5 (ref 5–15)
BUN: 16 mg/dL (ref 8–23)
CO2: 28 mmol/L (ref 22–32)
Calcium: 7.9 mg/dL — ABNORMAL LOW (ref 8.9–10.3)
Chloride: 102 mmol/L (ref 98–111)
Creatinine, Ser: 0.45 mg/dL (ref 0.44–1.00)
GFR, Estimated: >60 mL/min (ref >60)
Glucose, Bld: 96 mg/dL (ref 70–99)
Potassium: 4.9 mmol/L (ref 3.5–5.1)
Sodium: 135 mmol/L (ref 135–145)

## 2022-02-26 LAB — PROTIME-INR
INR: 1 (ref 0.8–1.2)
Prothrombin Time: 13.2 seconds (ref 11.4–15.2)

## 2022-02-26 MED ORDER — ENOXAPARIN SODIUM 120 MG/0.8ML IJ SOSY: 1.5000 mg/kg | PREFILLED_SYRINGE | Freq: Once | INTRAMUSCULAR | Status: DC

## 2022-02-26 MED ORDER — ENOXAPARIN SODIUM 40 MG/0.4ML IJ SOSY: 80.0000 mg | PREFILLED_SYRINGE | Freq: Two times a day (BID) | INTRAMUSCULAR | 0 refills | Status: DC

## 2022-02-26 MED ORDER — ENOXAPARIN SODIUM 80 MG/0.8ML IJ SOSY
1.0000 mg/kg | PREFILLED_SYRINGE | Freq: Once | INTRAMUSCULAR | Status: AC
  Administered 2022-02-26: 80 mg via SUBCUTANEOUS
  Filled 2022-02-26: qty 0.8

## 2022-02-26 NOTE — ED Notes (Addendum)
MD at bedside. 

## 2022-02-26 NOTE — ED Triage Notes (Signed)
Pt BIB EMS due to DVT and left arm swelling. Pt is non verbal at baseline screaming on arrival. Pt is HTN. Left arm swollen and red on arrival.

## 2022-02-26 NOTE — ED Notes (Addendum)
MD at bedside - Lab called to run labs

## 2022-02-26 NOTE — ED Notes (Signed)
[facility] called 

## 2022-02-26 NOTE — ED Provider Notes (Signed)
[facility] EMERGENCY DEPARTMENT Provider Note   CSN: [id number] Arrival date & time: 02/26/22  1855     History Chief Complaint  Patient presents with   DVT      Kathleen Jensen is a 71 y.o. female presenting for left upper extremity DVT.  HPI This is a 71 year old female with an extensive medical history presenting with a chief complaint of left upper extremity DVT and swelling over the past 3 days.  Outpatient DVT study was performed at her SNF today and positive for left upper extremity DVT.  Patient is nonverbal at baseline unable to provide any history.  Phone call to patient's daughter was had as well as a prolonged discussion with her power of attorney [employer]. 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  She was on Lovenox for an extended period of time.  This was complicated by intracranial microhemorrhages and her neurologist recommended discontinuation of long-term anticoagulation.  Notably, patient was recently admitted to the hospital last week for sepsis and altered mental status.  Much of the symptoms have resolved and only the left upper extremity symptoms are abnormal at this time.     Home Medications Prior to Admission medications   Medication Sig Start Date End Date Taking? Authorizing Provider  enoxaparin (LOVENOX) 40 MG/0.4ML injection Inject 0.8 mLs (80 mg total) into the skin every 12 (twelve) hours for 14 days. 02/26/22 03/12/22 Yes Eladio Dentremont, Almeta Monas, MD  acetaminophen (TYLENOL) 500 MG tablet Take 1,000 mg by mouth every 8 (eight) hours.    [provider]  amiodarone (PACERONE) 200 MG tablet 200 mg BID for 10 days then 200mg  daily for 21 days. Follow up with cardiology for further dosing 02/22/22   02/24/22, MD  ascorbic acid (VITAMIN C) 500 MG tablet Place 1 tablet (500 mg total) into feeding tube 2 (two) times daily. 02/22/22   02/24/22, MD   folic acid (FOLVITE) 400 MCG tablet Take 400 mcg by mouth daily.    [provider]  levETIRAcetam (KEPPRA) 1000 MG tablet Place 1 tablet (1,000 mg total) into feeding tube 2 (two) times daily. 02/22/22   02/24/22, MD  levothyroxine (SYNTHROID) 75 MCG tablet Place 1 tablet (75 mcg total) into feeding tube daily before breakfast. 02/22/22   02/24/22, MD  Lidocaine 4 % PTCH Apply 1 patch topically daily.    [provider]  methocarbamol (ROBAXIN) 500 MG tablet Take 1 tablet (500 mg total) by mouth every 8 (eight) hours as needed for muscle spasms. 02/22/22   02/24/22, MD  metoprolol tartrate (LOPRESSOR) 25 mg/10 mL SUSP Place 5 mLs (12.5 mg total) into feeding tube 2 (two) times daily. 02/22/22 03/24/22  03/26/22, MD  Nutritional Supplements (FEEDING SUPPLEMENT, JEVITY 1.2 CAL,) LIQD Place 1,000 mLs into feeding tube continuous. At 5ml/hr 02/22/22   02/24/22, MD  Nutritional Supplements (FEEDING SUPPLEMENT, PROSOURCE TF,) liquid Place 45 mLs into feeding tube 3 (three) times daily. 02/22/22   02/24/22, MD  ondansetron (ZOFRAN-ODT) 4 MG disintegrating tablet Take 4 mg by mouth every 8 (eight) hours as needed for nausea or vomiting. 05/24/21   [provider]  pantoprazole (PROTONIX) 40 MG tablet Take 40 mg by mouth daily. 02/12/22   [provider]  phenytoin (DILANTIN) 125 MG/5ML suspension Place 7.5 mLs (187.5 mg total) into feeding tube 2 (two) times daily. 7.5 mls BID 02/22/22   Alekh,  Kshitiz, MD  polyethylene glycol (MIRALAX / GLYCOLAX) 17 g packet Place 34 g into feeding tube daily as needed for mild constipation. 02/22/22   Glade Lloyd, MD  Probiotic Product (PROBIOTIC PO) 1 capsule by Per J Tube route daily.    [provider]  senna-docusate (SENOKOT-S) 8.6-50 MG tablet Place 2 tablets into feeding tube 2 (two) times daily. 02/22/22   Glade Lloyd, MD  Simethicone 80 MG TABS 1 tablet (80 mg total) by PEG Tube route in  the morning and at bedtime. 02/22/22   Glade Lloyd, MD  simvastatin (ZOCOR) 10 MG tablet Place 1 tablet (10 mg total) into feeding tube at bedtime. 02/22/22   Glade Lloyd, MD  valproic acid (DEPAKENE) 250 MG/5ML solution Place 10 mLs (500 mg total) into feeding tube 4 (four) times daily. 02/22/22   Glade Lloyd, MD  Water For Irrigation, Sterile (FREE WATER) SOLN Place 100 mLs into feeding tube every 4 (four) hours. 02/22/22   Glade Lloyd, MD  zinc sulfate 220 (50 Zn) MG capsule Place 1 capsule (220 mg total) into feeding tube daily. 02/22/22   Glade Lloyd, MD      Allergies    Tape, Meperidine hcl, Sulfonamide derivatives, Bacitracin-polymyxin b, and Oxycodone    Review of Systems   Review of Systems  Unable to perform ROS: Patient nonverbal    Physical Exam Updated Vital Signs BP (!) 146/90   Pulse (!) 59   Temp (!) 97 F (36.1 C) (Axillary)   Resp (!) 21   SpO2 97%  Physical Exam Physical Exam  Neurologic: Motor intact in all four extremities though strongly decreased on the right, sensory intact in all 4 extremities  Head: Pupils are 74mm, equally round and reactive to light, patient has no obvious facial trauma, no hemotympanum  Neck: patient has no midline neck tenderness, no obvious injuries.  Thorax: Patient has stable clavicles, stable thorax with bilateral chest rise and breath sounds heard.  No penetrating thoracic injury.  CV/Pulm: RRR, no audible murmer/rubs/gallops, CTAB  Abdomen: Patient has no abdominal distention, no penetrating abdominal injury.  Back: Patient has no midline spinal tenderness in the thoracic and lumbar spine, patient has no paraspinal tenderness bilaterally.  Pelvis: Patient has a stable pelvis to compression with palpable femoral pulses.  Extremities:Patient's upper extremities with no obvious injury , radial pulses present. Patient's lower extremities with no obvious injury or abnormality, tibial pulses present.  Left upper extremity with  diffuse edema.  Notable swelling all throughout with palpable pulses.  No evidence of erythema or ulceration.   ED Course/ Medical Decision Making/ A&P    Procedures Procedures   Medications Ordered in ED Medications  enoxaparin (LOVENOX) injection 80 mg (80 mg Subcutaneous Given 02/26/22 2217)    Medical Decision Making:    Kathleen Jensen is a 71 y.o. female who presented to the ED today with LUE swelling detailed above.     Handoff received from EMS.  Additional history discussed with patient's family/caregivers.  Patient's presentation is complicated by their history of multiple comorbid medical problems, recent devastating stroke, ongoing outpatient skilled nursing care and complex complete medical history.  Patient placed on continuous vitals and telemetry monitoring while in ED which was reviewed periodically.  Reviewed outside records including paper copy of today's ultrasound report.  Notable for occlusive DVT of the left upper extremity in the basilic vein.  Complete initial physical exam performed, notably the patient  was hemodynamically stable in no acute distress.  She is  mildly hypertensive.  She has a diffusely swollen left upper extremity.       Reviewed and confirmed nursing documentation for past medical history, family history, social history.    Initial Assessment:   Patient's history of present illness and physical exam findings are notable for deep venous thrombosis of the left upper extremity.  Likely secondary to patient's known recurrent occlusive disease not on anticoagulation. This is most consistent with an acute life/limb threatening illness complicated by underlying chronic conditions.  Initial Plan:  Screening labs including CBC and Metabolic panel to evaluate for infectious or metabolic etiology of disease.  Coagulation pannel to assess for [facility] clinical utility Objective evaluation as below reviewed   Initial Study Results:   Laboratory   All laboratory results reviewed without evidence of clinically relevant pathology.    Final Assessment and Plan:   Kathleen Jensen was seen for left upper extremity swelling and outpatient ultrasound diagnostic for DVT.  She was referred for second opinion on appropriateness of anticoagulation given family concern for history of likely traumatic intracranial hemorrhage.  Per patient's daughter, she was on anticoagulation for an extended period of time secondary to recurrent pulmonary embolisms but was taken off after being found to have a acute hypertensive bleed. Screening labs were performed with no focal abnormality. I had a prolonged conversation with Kathleen Jensen power of attorney regarding risks and benefits of initiating anticoagulation.  In particular, we discussed possible progression of patient's historical intracranial bleeding versus risk of progression of patient's known DVT.  Possibility of intracranial hemorrhage following initiation of anticoagulation remains a risk of initiation of this therapy. However, risk of progression of DVT to pulmonary embolism is a risk of not treating patient's DVT.  After presenting risks and benefits to patient's daughter, she would like to proceed with anticoagulation.  Patient received first dose of Lovenox in the emergency department and will need initiation of twice daily Lovenox starting tomorrow. Patient will need to be evaluated by her primary care provider within 48 hours and referred to outpatient hematologist as well as her outpatient neurologist for ultimate decision making regarding long-term treatment of patient's recurrent deep vein thrombosis disease. Family endorsed agreement with this plan.  Clinical Impression:  1. Acute deep vein thrombosis (DVT) of brachial vein of left upper extremity [facility])         Discharge   Final Clinical Impression(s) / ED Diagnoses Final diagnoses:  Acute deep vein thrombosis (DVT) of brachial vein of left upper  extremity ([place])    Rx / DC Orders ED Discharge Orders          Ordered    enoxaparin (LOVENOX) 40 MG/0.4ML injection  Every 12 hours        02/26/22 2138              Tretha Sciara, MD 02/26/22 2256

## 2022-02-26 NOTE — Discharge Instructions (Addendum)
Kathleen Jensen was seen for left upper extremity swelling and outpatient ultrasound concerning for DVT.  She was referred for second opinion on appropriateness of anticoagulation given family concern for history of intracranial hemorrhage. Screening labs were performed with no focal abnormality. I had a prolonged conversation with Kathleen Jensen power of attorney regarding risks and benefits of initiating anticoagulation.  In particular, we discussed possible progression of patient's historical intracranial hypertension versus risk of progression of patient's known DVT.  Possibility of intracranial hemorrhage following initiation of anticoagulation remains a risk of initiation of this therapy. However, risk of progression of DVT to pulmonary embolism is a risk of not treating patient's DVT.  After presenting risks and benefits to patient's daughter, she would like to proceed with anticoagulation.  Patient received first dose of Lovenox in the emergency department and will need initiation of twice daily Lovenox starting tomorrow. Patient will need to be evaluated by her primary care provider within 48 hours and referred to outpatient hematologist as well as her outpatient neurologist for ultimate decision making regarding long-term treatment of patient's recurrent deep vein thrombosis disease. Family endorsed agreement with this plan.

## 2022-02-27 NOTE — ED Notes (Signed)
Report given to Nia at [facility]

## 2022-02-27 NOTE — ED Notes (Signed)
RN reviewed discharge instructions with PTAR. PTAR verbalized understanding and had no further questions. VSS upon discharge 

## 2022-03-27 ENCOUNTER — Telehealth: Payer: Self-pay | Admitting: Hematology and Oncology

## 2022-03-27 NOTE — Telephone Encounter (Signed)
[name] called to sch pts new hem appt per 8/30 referral. They are aware of appt date/time.

## 2022-04-11 ENCOUNTER — Other Ambulatory Visit: Payer: Self-pay

## 2022-04-11 ENCOUNTER — Inpatient Hospital Stay: Payer: Medicare HMO

## 2022-04-11 ENCOUNTER — Encounter: Payer: Self-pay | Admitting: Hematology and Oncology

## 2022-04-11 ENCOUNTER — Inpatient Hospital Stay: Payer: Medicare HMO | Attending: Hematology and Oncology | Admitting: Hematology and Oncology

## 2022-04-11 VITALS — BP 135/81 | HR 61 | Temp 97.9°F | Resp 16 | Ht 65.0 in

## 2022-04-11 DIAGNOSIS — Z7901 Long term (current) use of anticoagulants: Secondary | ICD-10-CM | POA: Insufficient documentation

## 2022-04-11 DIAGNOSIS — Z86718 Personal history of other venous thrombosis and embolism: Secondary | ICD-10-CM | POA: Diagnosis not present

## 2022-04-11 DIAGNOSIS — Z87891 Personal history of nicotine dependence: Secondary | ICD-10-CM | POA: Diagnosis not present

## 2022-04-11 NOTE — Addendum Note (Signed)
Addended by: Billey Co on: 04/11/2022 02:04 PM   Modules accepted: Orders

## 2022-04-11 NOTE — Progress Notes (Signed)
[facility] CONSULT NOTE  Patient Care Team: Patient, No Pcp Per as PCP - General (General Practice) Kathleen Reveal, MD as Consulting Physician (Obstetrics and Gynecology) Titus Dubin. Loletta Parish, MD as Consulting Physician (Pain Medicine) Rolene Arbour, MD as Referring Physician (Neurology)  CHIEF COMPLAINTS/PURPOSE OF CONSULTATION:  History of DVT  ASSESSMENT & PLAN:   This is a 71 year old female patient from a nursing facility with right-sided hemiparesis, hypertension, dyslipidemia, history of intracranial bleeding, bedbound, history of diabetes, history of DVT with PE (we do not know exactly when) most recently with a basilic vein thrombosis of the left upper extremity currently on anticoagulation with Lovenox referred to hematology for additional recommendations.  Patient unfortunately cannot give a good history, her speech is incomprehensible hence have done my best talking to the attending at the facility as well as the nursing supervisor there to get a good history.  According to my discussion with them, she had a history of DVT and PE but we do not know exactly when but it has not happened in the past 3 years for certain.  Most recently she had a basilic vein thrombosis in the left upper extremity and is currently on anticoagulation with Lovenox at 40 mg daily.  They were wondering about the role of anticoagulation and duration of anticoagulation in this patient.  We have clearly discussed that she definitely has risk factors for recurrent VTE especially with her prior history of DVT/PE and sedentary status however given her comorbidities, high risk of falls, history of intracranial bleeding, Lovenox 40 mg subcu is reasonable if at all the team decides to give her some form of anticoagulation.  We have also clearly discussed that even with lower dose of Lovenox like hers, there is certainly risk of bleeding and this has to be presented to the family and informed decision  needs to be made.  Dr. Harrison Jensen has kindly agreed to discuss this with her family and figure out if she will continue on Lovenox or not.  I have attempted to call the daughter in the initial part of the conversation.  As far as the most recent SVT of the left upper extremity, there is no clear indication for anticoagulation in everyone.  There is a trial CALISTO which has recommended prophylactic dose of anticoagulation in patients with certain characteristics of SVT.  I do not have access to this ultrasound report.  I have asked the facility to fax this to me.  But we do not have to anticoagulate every patient with superficial venous thrombosis especially in someone with many risk factors like hers.  I have clearly discussed all of the above.  There is no role for hypercoagulable work-up in this patient since this will not change the management. She can return to hematology as needed.  Please hold anticoagulation if platelet count is less than 50,000. Thank you for consulting [place] the care of this patient.  Please not hesitate contact us with any additional questions or concerns.  HISTORY OF PRESENTING ILLNESS:  Kathleen Jensen 71 y.o. 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 Patient speech is incomprehensible.  Family was not present with her.  Given the limitations of history, have called the  facility and spoke to supervisor who is the clinical person who knows a bit about [name].  Later I also spoke to Dr. Harrison Jensen who is attending at the nursing facility.  According to Dr. Juliann Jensen history, patient had basilic vein thrombosis  in the left upper extremity back in August and currently continues on anticoagulation with Lovenox 40 mg subcu once daily.  Prior to that there were notes that she had history of DVT in the lower extremity as well as PE but we do not exactly know the timeline of when these happened but they have not happened in the past 3 years at least. Dr. Harrison Jensen also mentioned history of intracranial bleeding about 5 years ago and patient being a high fall risk.  She is nearly completely immobile and has to be moved by the nursing facility, has a chronic decubitus wound, heels in heel pads. Patient nods yes or no to questions. She denies any current history of bleeding. Attempt was made to obtain relevant review of systems and at this time no other pertinent review of systems positive.  MEDICAL HISTORY:  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)    Hyperlipemia    Hypertension    Insomnia    Intracerebral hemorrhage, intraventricular (HCC)    Muscle weakness    Other abnormalities of gait and mobility    Seizures (HCC)    Stroke (HCC)    Unsteadiness on feet    Vitamin B deficiency     SURGICAL HISTORY: Past Surgical History:  Procedure Laterality Date   BACK SURGERY     Dec 18, 2016 Dr.Torrealba   BREAST BIOPSY Right    BREAST EXCISIONAL BIOPSY     CERVICAL SPINE SURGERY     C4   CESAREAN SECTION     GASTRIC BYPASS  03/2007   TONSILLECTOMY      SOCIAL HISTORY: Social History   Socioeconomic History   Marital status: Divorced    Spouse name: Not on file   Number of children: 3   Years of education:  78yrs   Highest education level: Not on file  Occupational History   Occupation: Retired    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   Caffeine Use: 1 cup daily   1 dog   Social Determinants of Health   Financial Resource Strain: Not on file  Food Insecurity: Not on file  Transportation Needs: Not on file  Physical Activity: Not on file  Stress: Not on file  Social Connections: Not on file  Intimate Partner Violence: Not on file    FAMILY HISTORY: Family History  Problem Relation Age of Onset   High blood pressure Mother    Diabetes Mother    Diabetes Father    Stroke Father    Cancer Sister        Colorectal cancer   Cancer Brother        colon    ALLERGIES:  is allergic to tape, meperidine hcl, sulfonamide derivatives, bacitracin-polymyxin b, and oxycodone.  MEDICATIONS:  Current Outpatient Medications  Medication Sig Dispense Refill   acetaminophen (TYLENOL) 500 MG tablet Take 1,000 mg by mouth every 8 (eight) hours.     amiodarone (PACERONE) 200 MG tablet 200 mg BID for 10 days then 200mg  daily for 21 days. Follow up with cardiology for further dosing 30 tablet 0   ascorbic acid (VITAMIN C) 500 MG tablet Place 1 tablet (500 mg total) into feeding tube 2 (two) times daily. 30 tablet 0   enoxaparin (LOVENOX) 40 MG/0.4ML injection Inject 0.8 mLs (80 mg total) into the skin every 12 (twelve) hours for 14 days. [id number] mL 0   folic acid (FOLVITE) A999333 MCG tablet Take 400 mcg by mouth daily.     levETIRAcetam (KEPPRA)  1000 MG tablet Place 1 tablet (1,000 mg total) into feeding tube 2 (two) times daily.     levothyroxine (SYNTHROID) 75 MCG tablet Place 1 tablet (75 mcg total) into feeding tube daily before breakfast.     Lidocaine 4 % PTCH Apply 1 patch topically daily.     methocarbamol (ROBAXIN) 500 MG tablet Take 1 tablet (500 mg total) by mouth every 8 (eight) hours as needed for muscle spasms.     metoprolol tartrate (LOPRESSOR) 25 mg/10 mL SUSP Place 5 mLs (12.5 mg total) into feeding tube 2 (two) times daily. 300 mL 0   Nutritional Supplements (FEEDING SUPPLEMENT, JEVITY 1.2 CAL,) LIQD Place 1,000 mLs into feeding tube continuous. At 19ml/hr  0   Nutritional Supplements (FEEDING SUPPLEMENT, PROSOURCE TF,) liquid Place 45 mLs into feeding tube 3 (three) times daily.     ondansetron (ZOFRAN-ODT) 4 MG disintegrating tablet Take 4 mg by mouth every 8 (eight) hours as needed for nausea or vomiting.     pantoprazole (PROTONIX) 40 MG tablet Take 40 mg by mouth daily.     phenytoin (DILANTIN) 125 MG/5ML suspension Place 7.5 mLs (187.5 mg total) into feeding tube 2 (two) times daily. 7.5 mls BID     polyethylene glycol (MIRALAX / GLYCOLAX) 17 g packet Place 34 g into feeding tube daily as needed for mild constipation.     Probiotic Product (PROBIOTIC PO) 1 capsule by Per J Tube route daily.     senna-docusate (SENOKOT-S) 8.6-50 MG tablet Place 2 tablets into feeding tube 2 (two) times daily.     Simethicone 80 MG TABS 1 tablet (80 mg total) by PEG Tube route in the morning and at bedtime.  0   simvastatin (ZOCOR) 10 MG tablet Place 1 tablet (10 mg total) into feeding tube at bedtime.     valproic acid (DEPAKENE) 250 MG/5ML solution Place 10 mLs (500 mg total) into feeding tube 4 (four) times daily.     Water For Irrigation, Sterile (FREE WATER) SOLN Place 100 mLs into feeding tube every 4 (four) hours.     zinc sulfate 220 (50 Zn) MG capsule Place 1 capsule (220 mg total) into feeding tube daily. 60 capsule 0   No  current facility-administered medications for this visit.     PHYSICAL EXAMINATION: ECOG PERFORMANCE STATUS: 0 - Asymptomatic  Vitals:   04/11/22 1009  BP: 135/81  Jensen: 61  Resp: 16  Temp: 97.9 F (36.6 C)  SpO2: 99%   Filed Weights    Physical Exam Constitutional:      Appearance: Normal appearance.  Cardiovascular:     Rate and Rhythm: Normal rate and regular rhythm.  Pulmonary:     Effort: Pulmonary effort is normal.     Comments: Again limited exam since the patient cannot even lean forward.  Anterior lung sounds felt normal. Musculoskeletal:        General: No swelling or tenderness.     Cervical back: Normal range of motion and neck supple. No rigidity.     Comments: Lower part of legs not examined, she is on heel pads and she screams in pain when the legs are moved  Lymphadenopathy:     Cervical: No cervical adenopathy.  Skin:    General: Skin is warm and dry.  Neurological:     Mental Status: She is alert.     Motor: Weakness present.      LABORATORY DATA:  I have reviewed the data as listed Lab Results  Component Value Date   WBC 13.2 (H) 02/26/2022   HGB 10.0 (L) 02/26/2022   HCT 31.8 (L) 02/26/2022   MCV 82.2 02/26/2022   PLT 355 02/26/2022     Chemistry      Component Value Date/Time   NA 135 02/26/2022 1932   K 4.9 02/26/2022 1932   CL 102 02/26/2022 1932   CO2 28 02/26/2022 1932   BUN 16 02/26/2022 1932   CREATININE 0.45 02/26/2022 1932      Component Value Date/Time   CALCIUM 7.9 (L) 02/26/2022 1932   ALKPHOS 78 02/19/2022 0257   AST 45 (H) 02/19/2022 0257   ALT 19 02/19/2022 0257   BILITOT 0.6 02/19/2022 0257       RADIOGRAPHIC STUDIES: I have personally reviewed the radiological images as listed and agreed with the findings in the report. No results found.  All questions were answered. The patient knows to call the clinic with any problems, questions or concerns. I spent 60 minutes in the care of this patient including H  and P, review of records, counseling and coordination of care.     Benay Pike, MD 04/11/2022 10:17 AM

## 2022-05-15 ENCOUNTER — Emergency Department (HOSPITAL_COMMUNITY): Payer: Medicare HMO

## 2022-05-15 ENCOUNTER — Inpatient Hospital Stay (HOSPITAL_COMMUNITY)
Admission: EM | Admit: 2022-05-15 | Discharge: 2022-05-18 | DRG: 100 | Disposition: A | Discharge status: Skilled Nursing Facility | Payer: Medicare HMO | Source: Skilled Nursing Facility | Attending: Family Medicine | Admitting: Family Medicine

## 2022-05-15 ENCOUNTER — Other Ambulatory Visit: Payer: Self-pay

## 2022-05-15 DIAGNOSIS — Z86711 Personal history of pulmonary embolism: Secondary | ICD-10-CM

## 2022-05-15 DIAGNOSIS — L89154 Pressure ulcer of sacral region, stage 4: Secondary | ICD-10-CM | POA: Diagnosis present

## 2022-05-15 DIAGNOSIS — R569 Unspecified convulsions: Secondary | ICD-10-CM

## 2022-05-15 DIAGNOSIS — G9340 Encephalopathy, unspecified: Secondary | ICD-10-CM | POA: Diagnosis present

## 2022-05-15 DIAGNOSIS — R4182 Altered mental status, unspecified: Principal | ICD-10-CM

## 2022-05-15 DIAGNOSIS — M1990 Unspecified osteoarthritis, unspecified site: Secondary | ICD-10-CM | POA: Diagnosis present

## 2022-05-15 DIAGNOSIS — S31000A Unspecified open wound of lower back and pelvis without penetration into retroperitoneum, initial encounter: Secondary | ICD-10-CM

## 2022-05-15 DIAGNOSIS — I69320 Aphasia following cerebral infarction: Secondary | ICD-10-CM

## 2022-05-15 DIAGNOSIS — Z931 Gastrostomy status: Secondary | ICD-10-CM

## 2022-05-15 DIAGNOSIS — Z9884 Bariatric surgery status: Secondary | ICD-10-CM

## 2022-05-15 DIAGNOSIS — Z87891 Personal history of nicotine dependence: Secondary | ICD-10-CM

## 2022-05-15 DIAGNOSIS — E669 Obesity, unspecified: Secondary | ICD-10-CM | POA: Diagnosis present

## 2022-05-15 DIAGNOSIS — N390 Urinary tract infection, site not specified: Secondary | ICD-10-CM

## 2022-05-15 DIAGNOSIS — K219 Gastro-esophageal reflux disease without esophagitis: Secondary | ICD-10-CM | POA: Diagnosis present

## 2022-05-15 DIAGNOSIS — Z6830 Body mass index (BMI) 30.0-30.9, adult: Secondary | ICD-10-CM

## 2022-05-15 DIAGNOSIS — I680 Cerebral amyloid angiopathy: Secondary | ICD-10-CM | POA: Diagnosis present

## 2022-05-15 DIAGNOSIS — G40909 Epilepsy, unspecified, not intractable, without status epilepticus: Secondary | ICD-10-CM | POA: Diagnosis not present

## 2022-05-15 DIAGNOSIS — T420X5A Adverse effect of hydantoin derivatives, initial encounter: Secondary | ICD-10-CM | POA: Diagnosis present

## 2022-05-15 DIAGNOSIS — Z823 Family history of stroke: Secondary | ICD-10-CM

## 2022-05-15 DIAGNOSIS — L309 Dermatitis, unspecified: Secondary | ICD-10-CM | POA: Diagnosis present

## 2022-05-15 DIAGNOSIS — Z79899 Other long term (current) drug therapy: Secondary | ICD-10-CM

## 2022-05-15 DIAGNOSIS — Z885 Allergy status to narcotic agent status: Secondary | ICD-10-CM

## 2022-05-15 DIAGNOSIS — Z950 Presence of cardiac pacemaker: Secondary | ICD-10-CM

## 2022-05-15 DIAGNOSIS — Z833 Family history of diabetes mellitus: Secondary | ICD-10-CM

## 2022-05-15 DIAGNOSIS — Z883 Allergy status to other anti-infective agents status: Secondary | ICD-10-CM

## 2022-05-15 DIAGNOSIS — E854 Organ-limited amyloidosis: Secondary | ICD-10-CM | POA: Diagnosis present

## 2022-05-15 DIAGNOSIS — E039 Hypothyroidism, unspecified: Secondary | ICD-10-CM | POA: Diagnosis present

## 2022-05-15 DIAGNOSIS — E785 Hyperlipidemia, unspecified: Secondary | ICD-10-CM | POA: Diagnosis present

## 2022-05-15 DIAGNOSIS — Z882 Allergy status to sulfonamides status: Secondary | ICD-10-CM

## 2022-05-15 DIAGNOSIS — Z7982 Long term (current) use of aspirin: Secondary | ICD-10-CM

## 2022-05-15 DIAGNOSIS — I69351 Hemiplegia and hemiparesis following cerebral infarction affecting right dominant side: Secondary | ICD-10-CM

## 2022-05-15 DIAGNOSIS — R451 Restlessness and agitation: Secondary | ICD-10-CM | POA: Diagnosis not present

## 2022-05-15 DIAGNOSIS — I700 Atherosclerosis of aorta: Secondary | ICD-10-CM | POA: Diagnosis present

## 2022-05-15 DIAGNOSIS — Z86718 Personal history of other venous thrombosis and embolism: Secondary | ICD-10-CM

## 2022-05-15 DIAGNOSIS — Z91048 Other nonmedicinal substance allergy status: Secondary | ICD-10-CM

## 2022-05-15 DIAGNOSIS — Z79890 Hormone replacement therapy (postmenopausal): Secondary | ICD-10-CM

## 2022-05-15 DIAGNOSIS — E119 Type 2 diabetes mellitus without complications: Secondary | ICD-10-CM | POA: Diagnosis present

## 2022-05-15 DIAGNOSIS — I10 Essential (primary) hypertension: Secondary | ICD-10-CM | POA: Diagnosis present

## 2022-05-15 LAB — I-STAT VENOUS BLOOD GAS, ED
Acid-Base Excess: 1 mmol/L (ref 0.0–2.0)
Bicarbonate: 27.3 mmol/L (ref 20.0–28.0)
Calcium, Ion: 1.1 mmol/L — ABNORMAL LOW (ref 1.15–1.40)
HCT: 42 % (ref 36.0–46.0)
Hemoglobin: 14.3 g/dL (ref 12.0–15.0)
O2 Saturation: 66 %
Potassium: 3.9 mmol/L (ref 3.5–5.1)
Sodium: 139 mmol/L (ref 135–145)
TCO2: 29 mmol/L (ref 22–32)
pCO2, Ven: 49.3 mmHg (ref 44–60)
pH, Ven: 7.351 (ref 7.25–7.43)
pO2, Ven: 36 mmHg (ref 32–45)

## 2022-05-15 LAB — URINALYSIS, ROUTINE W REFLEX MICROSCOPIC
Bilirubin Urine: NEGATIVE
Glucose, UA: NEGATIVE mg/dL
Hgb urine dipstick: NEGATIVE
Ketones, ur: NEGATIVE mg/dL
Nitrite: POSITIVE — AB
Protein, ur: 30 mg/dL — AB
Specific Gravity, Urine: 1.013 (ref 1.005–1.030)
WBC, UA: >50 WBC/hpf — ABNORMAL HIGH (ref 0–5)
pH: 6 (ref 5.0–8.0)

## 2022-05-15 LAB — CBC WITH DIFFERENTIAL/PLATELET
Abs Immature Granulocytes: 0.01 10*3/uL (ref 0.00–0.07)
Basophils Absolute: 0.1 10*3/uL (ref 0.0–0.1)
Basophils Relative: 1 %
Eosinophils Absolute: 0.2 10*3/uL (ref 0.0–0.5)
Eosinophils Relative: 3 %
HCT: 43.1 % (ref 36.0–46.0)
Hemoglobin: 13.5 g/dL (ref 12.0–15.0)
Immature Granulocytes: 0 %
Lymphocytes Relative: 37 %
Lymphs Abs: 3.4 10*3/uL (ref 0.7–4.0)
MCH: 28.7 pg (ref 26.0–34.0)
MCHC: 31.3 g/dL (ref 30.0–36.0)
MCV: 91.5 fL (ref 80.0–100.0)
Monocytes Absolute: 0.7 10*3/uL (ref 0.1–1.0)
Monocytes Relative: 7 %
Neutro Abs: 4.9 10*3/uL (ref 1.7–7.7)
Neutrophils Relative %: 52 %
Platelets: 207 10*3/uL (ref 150–400)
RBC: 4.71 MIL/uL (ref 3.87–5.11)
RDW: 13.9 % (ref 11.5–15.5)
WBC: 9.3 10*3/uL (ref 4.0–10.5)
nRBC: 0 % (ref 0.0–0.2)

## 2022-05-15 LAB — COMPREHENSIVE METABOLIC PANEL
ALT: 21 U/L (ref 0–44)
AST: 40 U/L (ref 15–41)
Albumin: 2.8 g/dL — ABNORMAL LOW (ref 3.5–5.0)
Alkaline Phosphatase: 89 U/L (ref 38–126)
Anion gap: 12 (ref 5–15)
BUN: 19 mg/dL (ref 8–23)
CO2: 22 mmol/L (ref 22–32)
Calcium: 9 mg/dL (ref 8.9–10.3)
Chloride: 104 mmol/L (ref 98–111)
Creatinine, Ser: 0.57 mg/dL (ref 0.44–1.00)
GFR, Estimated: >60 mL/min (ref >60)
Glucose, Bld: 87 mg/dL (ref 70–99)
Potassium: 3.9 mmol/L (ref 3.5–5.1)
Sodium: 138 mmol/L (ref 135–145)
Total Bilirubin: 0.3 mg/dL (ref 0.3–1.2)
Total Protein: 8.9 g/dL — ABNORMAL HIGH (ref 6.5–8.1)

## 2022-05-15 LAB — TROPONIN I (HIGH SENSITIVITY)
Troponin I (High Sensitivity): 7 ng/L (ref <18)
Troponin I (High Sensitivity): 8 ng/L (ref <18)

## 2022-05-15 LAB — VALPROIC ACID LEVEL: Valproic Acid Lvl: 29 ug/mL — ABNORMAL LOW (ref 50.0–100.0)

## 2022-05-15 LAB — LACTIC ACID, PLASMA
Lactic Acid, Venous: 2.6 mmol/L (ref 0.5–1.9)
Lactic Acid, Venous: 8.5 mmol/L (ref 0.5–1.9)

## 2022-05-15 LAB — PROTIME-INR
INR: 1.1 (ref 0.8–1.2)
Prothrombin Time: 14.5 seconds (ref 11.4–15.2)

## 2022-05-15 LAB — PHENYTOIN LEVEL, TOTAL: Phenytoin Lvl: 21 ug/mL — ABNORMAL HIGH (ref 10.0–20.0)

## 2022-05-15 LAB — CBG MONITORING, ED: Glucose-Capillary: 89 mg/dL (ref 70–99)

## 2022-05-15 MED ORDER — FOLIC ACID 1 MG PO TABS
1.0000 mg | ORAL_TABLET | Freq: Every day | ORAL | Status: DC
  Administered 2022-05-16 – 2022-05-18 (×3): 1 mg
  Filled 2022-05-15 (×3): qty 1

## 2022-05-15 MED ORDER — PHENYTOIN SODIUM 50 MG/ML IJ SOLN: 100.0000 mg | Freq: Once | INTRAMUSCULAR | Status: DC

## 2022-05-15 MED ORDER — ENOXAPARIN SODIUM 40 MG/0.4ML IJ SOSY
40.0000 mg | PREFILLED_SYRINGE | INTRAMUSCULAR | Status: DC
  Administered 2022-05-15: 40 mg via SUBCUTANEOUS
  Filled 2022-05-15: qty 0.4

## 2022-05-15 MED ORDER — LACTATED RINGERS IV BOLUS
1000.0000 mL | Freq: Once | INTRAVENOUS | Status: AC
  Administered 2022-05-15: 1000 mL via INTRAVENOUS

## 2022-05-15 MED ORDER — FAMOTIDINE 40 MG/5ML PO SUSR: 40.0000 mg | Freq: Every day | ORAL | Status: DC

## 2022-05-15 MED ORDER — FAMOTIDINE 20 MG PO TABS
40.0000 mg | ORAL_TABLET | Freq: Every day | ORAL | Status: DC
  Administered 2022-05-16 – 2022-05-18 (×3): 40 mg
  Filled 2022-05-15 (×3): qty 2

## 2022-05-15 MED ORDER — VALPROATE SODIUM 100 MG/ML IV SOLN
500.0000 mg | Freq: Four times a day (QID) | INTRAVENOUS | Status: DC
  Administered 2022-05-16 – 2022-05-18 (×11): 500 mg via INTRAVENOUS
  Filled 2022-05-15 (×14): qty 5

## 2022-05-15 MED ORDER — ASPIRIN 81 MG PO CHEW: 81.0000 mg | CHEWABLE_TABLET | Freq: Every day | ORAL | Status: DC

## 2022-05-15 MED ORDER — SODIUM CHLORIDE 0.9 % IV SOLN: 150.0000 mg | Freq: Two times a day (BID) | INTRAVENOUS | Status: DC

## 2022-05-15 MED ORDER — HYDROXYZINE HCL 10 MG/5ML PO SYRP: 10.0000 mg | ORAL_SOLUTION | Freq: Three times a day (TID) | ORAL | Status: DC

## 2022-05-15 MED ORDER — ASPIRIN 81 MG PO TBEC: 81.0000 mg | DELAYED_RELEASE_TABLET | Freq: Every day | ORAL | Status: DC

## 2022-05-15 MED ORDER — SENNOSIDES-DOCUSATE SODIUM 8.6-50 MG PO TABS
2.0000 | ORAL_TABLET | Freq: Two times a day (BID) | ORAL | Status: DC
  Administered 2022-05-16 – 2022-05-18 (×5): 2
  Filled 2022-05-15 (×5): qty 2

## 2022-05-15 MED ORDER — LEVETIRACETAM IN NACL 1000 MG/100ML IV SOLN
1000.0000 mg | Freq: Two times a day (BID) | INTRAVENOUS | Status: DC
  Administered 2022-05-15 – 2022-05-18 (×6): 1000 mg via INTRAVENOUS
  Filled 2022-05-15 (×6): qty 100

## 2022-05-15 MED ORDER — SIMVASTATIN 20 MG PO TABS
10.0000 mg | ORAL_TABLET | Freq: Every day | ORAL | Status: DC
  Administered 2022-05-16 – 2022-05-17 (×2): 10 mg
  Filled 2022-05-15 (×2): qty 1

## 2022-05-15 MED ORDER — PANTOPRAZOLE 2 MG/ML SUSPENSION
40.0000 mg | Freq: Every day | ORAL | Status: DC
  Administered 2022-05-17 – 2022-05-18 (×2): 40 mg
  Filled 2022-05-15 (×3): qty 20

## 2022-05-15 MED ORDER — SIMETHICONE 80 MG PO CHEW
80.0000 mg | CHEWABLE_TABLET | Freq: Two times a day (BID) | ORAL | Status: DC
  Administered 2022-05-16 – 2022-05-18 (×5): 80 mg
  Filled 2022-05-15 (×5): qty 1

## 2022-05-15 MED ORDER — HYDROXYZINE HCL 10 MG PO TABS
10.0000 mg | ORAL_TABLET | Freq: Three times a day (TID) | ORAL | Status: DC
  Administered 2022-05-16 – 2022-05-18 (×8): 10 mg
  Filled 2022-05-15 (×9): qty 1

## 2022-05-15 MED ORDER — OMEPRAZOLE 20 MG PO TBDD: 20.0000 mg | DELAYED_RELEASE_TABLET | Freq: Every day | ORAL | Status: DC

## 2022-05-15 MED ORDER — FOLIC ACID 800 MCG PO TABS: 800.0000 ug | ORAL_TABLET | Freq: Every day | ORAL | Status: DC

## 2022-05-15 MED ORDER — SIMETHICONE 80 MG PO TABS: 1.0000 | ORAL_TABLET | Freq: Two times a day (BID) | ORAL | Status: DC

## 2022-05-15 MED ORDER — SODIUM CHLORIDE 0.9 % IV SOLN
1.0000 g | INTRAVENOUS | Status: DC
  Filled 2022-05-15: qty 10

## 2022-05-15 MED ORDER — ASPIRIN 81 MG PO CHEW
81.0000 mg | CHEWABLE_TABLET | Freq: Every day | ORAL | Status: DC
  Administered 2022-05-16 – 2022-05-18 (×3): 81 mg
  Filled 2022-05-15 (×3): qty 1

## 2022-05-15 MED ORDER — LEVOTHYROXINE SODIUM 75 MCG PO TABS
75.0000 ug | ORAL_TABLET | Freq: Every day | ORAL | Status: DC
  Administered 2022-05-16 – 2022-05-18 (×3): 75 ug
  Filled 2022-05-15 (×3): qty 1

## 2022-05-15 MED ORDER — SODIUM CHLORIDE 0.9 % IV SOLN
1.0000 g | Freq: Once | INTRAVENOUS | Status: AC
  Administered 2022-05-15: 1 g via INTRAVENOUS
  Filled 2022-05-15: qty 10

## 2022-05-15 NOTE — Assessment & Plan Note (Addendum)
Previous admissions for urosepsis in the last year with Klebsiella and Proteus. Has indwelling foley catheter. Current UA concerning for UTI, per the result information it was obtained via I&O cath though unsure of accuracy per nursing. Afebrile. - Continue ceftriaxone for now - Follow-up urine cultures - Monitor for symptoms when patient more alert - Monitor for fevers

## 2022-05-15 NOTE — Assessment & Plan Note (Addendum)
Stable, appears to be at baseline now per nursing facility and family. LA trended down 8.5>1.7. KUB negative. EEG as below; seizure vs dilantin toxicity leading diagnosis. - Continue EEG, meds per neurology, appreciate recs - AM CBC/BMP - Continuous cardiac telemetry - Vitals per routine

## 2022-05-15 NOTE — ED Notes (Signed)
Dr.Kingsley shown results of VBG. ED-Lab

## 2022-05-15 NOTE — ED Triage Notes (Signed)
Pt bib ems from [facility] after 2 days of being less alert than  normal. Pt normally alert, does not really talk. Staff noticed pt to be lethargic yesterday so  held all of her medication that my cause drowsiness. Staff noted no improvement today so called EMS. EMS reports pt had to be suctioned several times due to pt drooling and not handling secretions.  160/100 HR 70 96% 2L [place]

## 2022-05-15 NOTE — Consult Note (Signed)
NEURO HOSPITALIST CONSULT NOTE   Requestig physician: Dr. Owens Shark  Reason for Consult: AMS  History obtained from:  Patient   Chart  Patient and Chart   ***  HPI:                                                                                                                                          Kathleen Jensen is an 71 y.o. female ***  At home, she is on scheduled Keppra, valproic acid and Dilantin per tube.   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)    Hyperlipemia    Hypertension    Insomnia    Intracerebral hemorrhage, intraventricular (HCC)    Muscle weakness    Other abnormalities of gait and mobility    Seizures ([place])    Stroke (HCC)    Unsteadiness on feet    Vitamin B deficiency     Past Surgical History:  Procedure Laterality Date   BACK SURGERY     Dec 18, 2016 Dr.Torrealba   BREAST BIOPSY Right    BREAST EXCISIONAL BIOPSY     CERVICAL SPINE SURGERY     C4   CESAREAN SECTION     GASTRIC BYPASS  03/2007   TONSILLECTOMY      Family History  Problem Relation Age of Onset   High blood pressure Mother    Diabetes Mother    Diabetes Father    Stroke Father    Cancer Sister        Colorectal cancer   Cancer Brother        colon            ***  Family History: *** Mother *** Father ***  Social History:  reports that she quit smoking about 42 years ago. She has never used smokeless tobacco. She reports that she does not drink alcohol and does not use drugs.  Allergies  Allergen Reactions   Tape Rash   Demerol [Meperidine Hcl]  Nausea And Vomiting   Sulfonamide Derivatives Itching   Bacitracin-Polymyxin B Dermatitis   Oxycontin [Oxycodone] Itching    MEDICATIONS:                                                                                                                     {  medication reviewed/display:3041432}   ROS:                                                                                                                                       History obtained from {source of history:310783}  General ROS: negative for - chills, fatigue, fever, night sweats, weight gain or weight loss Psychological ROS: negative for - behavioral disorder, hallucinations, memory difficulties, mood swings or suicidal ideation Ophthalmic ROS: negative for - blurry vision, double vision, eye pain or loss of vision ENT ROS: negative for - epistaxis, nasal discharge, oral lesions, sore throat, tinnitus or vertigo Allergy and Immunology ROS: negative for - hives or itchy/watery eyes Hematological and Lymphatic ROS: negative for - bleeding problems, bruising or swollen lymph nodes Endocrine ROS: negative for - galactorrhea, hair pattern changes, polydipsia/polyuria or temperature intolerance Respiratory ROS: negative for - cough, hemoptysis, shortness of breath or wheezing Cardiovascular ROS: negative for - chest pain, dyspnea on exertion, edema or irregular heartbeat Gastrointestinal ROS: negative for - abdominal pain, diarrhea, hematemesis, nausea/vomiting or stool incontinence Genito-Urinary ROS: negative for - dysuria, hematuria, incontinence or urinary frequency/urgency Musculoskeletal ROS: negative for - joint swelling or muscular weakness Neurological ROS: as noted in HPI Dermatological ROS: negative for rash and skin lesion changes   Blood pressure (!) 146/88, pulse (!) 58, temperature 97.6 F (36.4 C), temperature source Axillary, resp. rate 14, SpO2 98 %.   General Examination:                                                                                                        Physical Exam  HEENT-  Normocephalic, no lesions, without obvious abnormality.  Normal external eye and conjunctiva.   Cardiovascular- S1-S2 audible, pulses palpable throughout   Lungs-no rhonchi or wheezing noted, no excessive working breathing.  Saturations within normal limits Abdomen- All 4 quadrants palpated and nontender Extremities- Warm, dry and intact Musculoskeletal-no joint tenderness, deformity or swelling Skin-warm and dry, no hyperpigmentation, vitiligo, or suspicious lesions  Neurological Examination Mental Status: Alert, oriented, thought content appropriate.  Speech fluent without evidence of aphasia.  Able to follow 3 step commands without difficulty. Cranial Nerves: II: Discs flat bilaterally; Visual fields grossly normal,  III,IV, VI: ptosis not present, extra-ocular motions intact bilaterally pupils equal, round, reactive to light and accommodation V,VII: smile symmetric, facial light touch sensation normal bilaterally VIII: hearing normal bilaterally IX,X: uvula rises symmetrically XI: bilateral shoulder shrug XII: midline tongue extension Motor: Right : Upper  extremity   5/5    Left:     Upper extremity   5/5  Lower extremity   5/5     Lower extremity   5/5 Tone and bulk:normal tone throughout; no atrophy noted Sensory: Pinprick and light touch intact throughout, bilaterally Deep Tendon Reflexes: 2+ and symmetric throughout Plantars: Right: downgoing   Left: downgoing Cerebellar: normal finger-to-nose, normal rapid alternating movements and normal heel-to-shin test Gait: normal gait and station   Lab Results: Basic Metabolic Panel: Recent Labs  Lab 05/15/22 1348 05/15/22 1349  NA 139 138  K 3.9 3.9  CL  --  104  CO2  --  22  GLUCOSE  --  87  BUN  --  19  CREATININE  --  0.57  CALCIUM  --  9.0    CBC: Recent Labs  Lab 05/15/22 1348 05/15/22 1349  WBC  --  9.3  NEUTROABS  --   4.9  HGB 14.3 13.5  HCT 42.0 43.1  MCV  --  91.5  PLT  --  207    Cardiac Enzymes: No results for input(s): "CKTOTAL", "CKMB", "CKMBINDEX", "TROPONINI" in the last 168 hours.  Lipid Panel: No results for input(s): "CHOL", "TRIG", "HDL", "CHOLHDL", "VLDL", "LDLCALC" in the last 168 hours.  Imaging: CT Head Wo Contrast  Result Date: 05/15/2022 CLINICAL DATA:  Altered mental status EXAM: CT HEAD WITHOUT CONTRAST TECHNIQUE: Contiguous axial images were obtained from the base of the skull through the vertex without intravenous contrast. RADIATION DOSE REDUCTION: This exam was performed according to the departmental dose-optimization program which includes automated exposure control, adjustment of the mA and/or kV according to patient size and/or use of iterative reconstruction technique. COMPARISON:  02/15/2022 FINDINGS: Brain: No acute intracranial findings are seen. There are no signs of bleeding within the cranium. There is prominence of third and both lateral ventricles. Cortical sulci are prominent. There is old encephalomalacia in left parietal cortex with no significant change. Vascular: Unremarkable. Skull: Unremarkable. Sinuses/Orbits: Unremarkable. Other: None. IMPRESSION: No acute intracranial findings are seen in noncontrast CT brain. Old left parietal infarct. Atrophy. Electronically Signed   By: Elmer Picker M.D.   On: 05/15/2022 14:07   DG Chest Port 1 View  Result Date: 05/15/2022 CLINICAL DATA:  Altered mental status EXAM: PORTABLE CHEST 1 VIEW COMPARISON:  02/15/2022 FINDINGS: Dual lead pacemaker as seen previously. Aortic atherosclerosis. No evidence of heart failure, edema or effusion. No consolidation or collapse. IMPRESSION: No active disease. Dual lead pacemaker. Aortic atherosclerosis. Electronically Signed   By: Nelson Chimes M.D.   On: 05/15/2022 14:00     Assessment: -  Recommendations: - At home she is taking Keppra, valproic acid and Dilantin per tube. All  should be switched to IV this admission until her clinical condition improves.  - All of her scheduled evening anticonvulsant doses should be given now, without any catch up doses for the missed doses earlier today.  - If she has a seizure administer 2 mg IV Ativan and call Neurology for further recommendations regarding a supplemental loading dose of one of the above. - EEG in AM.  -    Electronically signed: Dr. Kerney Elbe 05/15/2022, 7:59 PM

## 2022-05-15 NOTE — H&P (Cosign Needed Addendum)
Hospital Admission History and Physical Service Pager: [phone]  Patient name: Kathleen Jensen Medical record number: [id number] Date of Birth: [birth year] Age: 71 y.o. Gender: female  Primary Care Provider: Patient, No Pcp Per Consultants: Neurology  Code Status: Full Preferred Emergency Contact:   Name Relation Home Work [place] Daughter [phone]  [phone]   Francella Solian Granddaughter   [phone]   Wiedel,cheryl Daughter   [phone]   Michel,Chantel Daughter   [phone]   Chief Complaint: AMS  Assessment and Plan: Kathleen Jensen is a 71 y.o. female presenting with altered mental status and possible witnessed seizure at nursing facility. Differential for presentation of this includes seizure, acidotic state, arrhythmia, UTI.   * Altered mental status Baseline is interactive with others, 1-2 words, limited communication. GCS 10, opens eyes to speech, incomprehensible sounds, localizes to pain.  Daughter at bedside reports mother typically acts this way after seizures/previous urosepsis.  Leading differential is postictal 2/2 seizure versus urinary tract infection (see below).  Lactic acid is trending up, believe this to be secondary to seizure- less likely infectious due to minimal signs and symptoms of infection.  Chronically ill, multiple comorbidities, concerned that patient could decompensate-we will admit progressive. - Admit progressive, attending Dr. Owens Shark - Continuous cardiac telemetry - Neurology consult, recommendations appreciated - Trend lactic acid - Feeding and medications per tube - AM CBC/BMP - Vitals per routine - Frequent mental status checks - Limit sedating medications as able  Seizures ([place]) Unclear if post-ictal state contributing to altered mental status. Home medications include Keppra 1 g twice daily (with considering going up to 2 g twice daily), phenytoin 187.5 mg twice daily and Depakote  500 mg 4 times daily.  PT/OT/SLP at SNF.  At her last neurology visit she presented with progressive neurologic decline and due to concern of previous microhemorrhages a MRI was ordered (not completed).  Neurology had recommended against anticoagulation at this time if possible due to brain bleed/GI bleed risks. 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  We attempted to visualize the ulcer, however patient was unable to tolerate rolling and wound was sufficiently covered. - Wound care consult  UTI (urinary tract infection) Previous admissions for urosepsis in the last year. Has indwelling foley catheter. Current UA concerning for UTI, per the result information it was obtained via I&O cath. - Continue ceftriaxone - Follow-up urine cultures - Monitor for symptoms when patient more alert - Monitor for fevers   Other chronic conditions Hypothyroidism: Continue home Synthroid, check TSH GERD: Continue home PPI and H2   FEN/GI: RD consulted, tube feed VTE Prophylaxis: SCDs  Disposition: Progressive  History of Present Illness:  Kathleen Jensen is a 71 y.o. female presenting with altered mental status.  Patient currently resides at [facility]. At baseline, has significant aphasia and can only say a few words. Typically is engaged in conversation and interacts with family. She has the ability to use her left arm somewhat but does not move her other extremities. She requires tube feeds and most medications per her tube.  [name] present at bedside to provide history. . Per the daughter, patient's other daughter was with her last night and stated that she was acting a bit odd and was not as interactive/engaging as she normally is and took a video  of the patient in which  she was groaning and moaning.  Daughter reports that she was called today and told that the patient may have had seizure like activity. They were concerned that she was altered as she did not seem to be in pain with her wound care changes, which normally cause her a significant amount of pain.   Patient currently follows with [facility] neurology. They were discussing with family about wanting to get an MRI, but she has a pacemaker and there were concerns that they would not be able to tell if the patient was feeling anything while getting the MRI.    In the ED, patient was started on ceftriaxone for UTI (previous urosepsis was Klebsiella susceptible to ceftriaxone).  Seizure medicine levels obtained, valproic acid subtherapeutic.  Review Of Systems: Per HPI  Pertinent Past Medical History: Seizure disorder HTN Type 2 diabetes Hypothyroidism Hyperlipidemia CVA history Anemia  Remainder reviewed in history tab.   Pertinent Past Surgical History: Pacemaker dual-chamber replacement 2023 Jejunostomy tube placement February 2023  Remainder reviewed in history tab.  Pertinent Social History: Lives at [facility] facility  Pertinent Family History: Reviewed in history tab.   Important Outpatient Medications: Keppra Valproic acid Phenytoin [place] Levothyroxine  Remainder reviewed in medication history.   Objective: BP (!) 163/88   Pulse 60   Temp 97.6 F (36.4 C) (Axillary)   Resp 13   SpO2 97%  Exam: General: Sleeping, NAD Cardiovascular: RRR, no MRG Respiratory: CTAB, breathing comfortably on room air Derm: Cool lower extremities, no pitting edema Neuro:GCS 10, opens eyes to speech, incomprehensible sounds, localizes to pain.  Labs:  CBC BMET  Recent Labs  Lab 05/15/22 1349  WBC 9.3  HGB 13.5  HCT 43.1  PLT 207   Recent Labs  Lab 05/15/22 1349  NA 138  K 3.9  CL 104  CO2 22  BUN 19  CREATININE 0.57  GLUCOSE 87  CALCIUM 9.0    LA: 2.6>>  8.5 Troponin HS: 8 Phenytoin level: 21 Folic acid level: 29  EKG: Normal sinus, no signs of acute ischemia  Imaging Studies Performed: CT Head Wo Contrast Result Date: 05/15/2022 IMPRESSION: No acute intracranial findings are seen in noncontrast CT brain. Old left parietal infarct. Atrophy.   DG Chest Port 1 View Result Date: 05/15/2022 IMPRESSION: No active disease. Dual lead pacemaker. Aortic atherosclerosis.  Leslie Dales, DO 05/15/2022, 10:08 PM PGY-1, [place] pager: [phone], text pages welcome Secure chat group [facility] Resident Addendum   I have independently interviewed and examined the patient. I have discussed the above with the original author and agree with their documentation. My edits for correction/addition/clarification are in within the document. Please see also any attending notes.   Rise Patience, DO  PGY-3, [facility] 05/15/2022 10:41 PM  FPTS Service pager: [phone] (text pages welcome through [facility])

## 2022-05-15 NOTE — Assessment & Plan Note (Addendum)
Stable without further seizure activity in hospital. EEG with potential epileptogenicity but without seizure. Dilantin level also elevated; consider toxicity as above. - Neurology following, appreciate recs  - Consider MRI head if no improvement (patient has pacemaker) - Continue home Keppra 1 g BID and Depakote 500 mg Q6H - Holding dilantin until level approaching 10 per neurology

## 2022-05-15 NOTE — ED Provider Notes (Signed)
[facility] EMERGENCY DEPARTMENT Provider Note   CSN: [id number] Arrival date & time: 05/15/22  1316     History  Chief Complaint  Patient presents with   Altered Mental Status    Kathleen Jensen is a 71 y.o. female.  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  Per EMS, the patient is normally awake but aphasic at baseline.  They state that she has had increasing drowsiness over the last 2 days.  The nursing home staff held her home medications yesterday for possible medication oversedation, however she had no improvement so they called 911.  EMS did have to suction her frequently in route here but she was otherwise hemodynamically stable.  No report of any trauma or falls.  I spoke with the nursing home staff that states she was getting wound care today prior to arrival and normally screams in pain during her wound care but was lethargic during wound care today.  They state that when she went on responsive and started to have secretions at the mouth and they are concerned that she may have been seizing.  They state that she receives all of her medications via G-tube and has been receiving her antiepileptics as prescribed.  Per records, patient is full code.   The history is provided by the EMS personnel.  Altered Mental Status      Home Medications Prior to Admission medications   Medication Sig Start Date End Date Taking? Authorizing Provider  acetaminophen (TYLENOL) 160 MG/5ML liquid Place 960 mg into feeding tube every 8 (eight) hours.   Yes [provider]  Acidophilus Lactobacillus CAPS Give 1 capsule by tube daily.   Yes [provider]   amiodarone (PACERONE) 200 MG tablet 200 mg BID for 10 days then 200mg  daily for 21 days. Follow up with cardiology for further dosing Patient taking differently: Place 200 mg into feeding tube daily. 02/22/22  Yes Aline August, MD  ascorbic acid (VITAMIN C) 500 MG tablet Place 1 tablet (500 mg total) into feeding tube 2 (two) times daily. Patient taking differently: Place 500 mg into feeding tube daily. 02/22/22  Yes Aline August, MD  aspirin EC 81 MG tablet 81 mg at bedtime. Via tube   Yes [provider]  baclofen 25 MG/5ML SUSP 20 mg every 8 (eight) hours. Via tube   Yes [provider]  Cholecalciferol 25 MCG (1000 UT) capsule Place 1,000 Units into feeding tube at bedtime. 03/12/22  Yes [provider]  famotidine (PEPCID) 40 MG/5ML suspension Place 40 mg into feeding tube daily. 03/25/22  Yes [provider]  ferrous sulfate 220 (44 Fe) MG/5ML solution Take 220 mg by mouth at bedtime.   Yes [provider]  folic acid (FOLVITE) 992 MCG tablet Place 800 mcg into feeding tube daily.   Yes [provider]  hydrOXYzine (ATARAX) 10 MG/5ML syrup Place 10 mg into feeding tube 3 (three) times daily. 03/22/22  Yes [provider]  levETIRAcetam (KEPPRA) 100 MG/ML solution Place 1,000 mg into feeding tube 2 (two) times daily.   Yes [provider]  levothyroxine (SYNTHROID) 75 MCG tablet Place 1 tablet (75 mcg total) into feeding tube daily before breakfast. 02/22/22  Yes Aline August, MD  Lidocaine  4 % PTCH Place 1 patch onto the skin daily. To lower back   Yes [provider]  Menthol, Topical Analgesic, (BIOFREEZE) 4 % GEL Apply 2 g topically 2 (two) times daily. Upper left arm   Yes [provider]  metoprolol tartrate (LOPRESSOR) 25 MG tablet Place 12.5 mg into feeding tube 2 (two) times daily.   Yes [provider]  Omeprazole 20 MG TBDD Give 20 mg by tube daily. Omeprazole 20 mg dissolvable tablet    Yes [provider]  phenytoin (DILANTIN) 125 MG/5ML suspension Place 7.5 mLs (187.5 mg total) into feeding tube 2 (two) times daily. 7.5 mls BID Patient taking differently: Place 200 mg into feeding tube 2 (two) times daily. 02/22/22  Yes Aline August, MD  pregabalin (LYRICA) 75 MG capsule Take 75 mg by mouth 3 (three) times daily.   Yes [provider]  senna-docusate (SENOKOT-S) 8.6-50 MG tablet Place 2 tablets into feeding tube 2 (two) times daily. 02/22/22  Yes Aline August, MD  Simethicone 80 MG TABS 1 tablet (80 mg total) by PEG Tube route in the morning and at bedtime. Patient taking differently: Give 80 mg by tube in the morning and at bedtime. Simethicone 80mg  chewable tablet 02/22/22  Yes Aline August, MD  simvastatin (ZOCOR) 10 MG tablet Place 1 tablet (10 mg total) into feeding tube at bedtime. 02/22/22  Yes Aline August, MD  valproic acid (DEPAKENE) 250 MG/5ML solution Place 10 mLs (500 mg total) into feeding tube 4 (four) times daily. 02/22/22  Yes Aline August, MD  Zinc 50 MG TABS Give 50 mg by tube daily.   Yes [provider]  enoxaparin (LOVENOX) 40 MG/0.4ML injection Inject 0.8 mLs (80 mg total) into the skin every 12 (twelve) hours for 14 days. Patient not taking: Reported on 05/15/2022 02/26/22 07/06/22  Tretha Sciara, MD  methocarbamol (ROBAXIN) 500 MG tablet Take 1 tablet (500 mg total) by mouth every 8 (eight) hours as needed for muscle spasms. Patient not taking: Reported on 05/15/2022 02/22/22   Aline August, MD  metoprolol tartrate (LOPRESSOR) 25 mg/10 mL SUSP Place 5 mLs (12.5 mg total) into feeding tube 2 (two) times daily. Patient not taking: Reported on 05/15/2022 02/22/22 07/06/22  Aline August, MD  Nutritional Supplements (FEEDING SUPPLEMENT, JEVITY 1.2 CAL,) LIQD Place 1,000 mLs into feeding tube continuous. At 58ml/hr Patient not taking: Reported on 05/15/2022 02/22/22   Aline August, MD  Nutritional Supplements (FEEDING  SUPPLEMENT, PROSOURCE TF,) liquid Place 45 mLs into feeding tube 3 (three) times daily. Patient not taking: Reported on 05/15/2022 02/22/22   Aline August, MD  polyethylene glycol (MIRALAX / GLYCOLAX) 17 g packet Place 34 g into feeding tube daily as needed for mild constipation. Patient not taking: Reported on 05/15/2022 02/22/22   Aline August, MD  Water For Irrigation, Sterile (FREE WATER) SOLN Place 100 mLs into feeding tube every 4 (four) hours. Patient not taking: Reported on 05/15/2022 02/22/22   Aline August, MD      Allergies    Tape, Demerol [meperidine hcl], Sulfonamide derivatives, Bacitracin-polymyxin b, and Oxycontin [oxycodone]    Review of Systems   Review of Systems  Physical Exam Updated Vital Signs BP (!) 149/78   Pulse (!) 59   Temp (!) 97.3 F (36.3 C) (Temporal)   Resp 13   SpO2 96%  Physical Exam Vitals and nursing note reviewed.  Constitutional:      Appearance: She is obese. She is not toxic-appearing.     Comments:  Lethargic, eyes closed, making incomprehensible sounds  HENT:     Head: Normocephalic and atraumatic.     Nose: Nose normal.     Mouth/Throat:     Mouth: Mucous membranes are moist.     Comments: Jaw clenched Eyes:     Conjunctiva/sclera: Conjunctivae normal.     Pupils: Pupils are equal, round, and reactive to light.  Cardiovascular:     Rate and Rhythm: Normal rate and regular rhythm.     Pulses: Normal pulses.     Heart sounds: Normal heart sounds.  Pulmonary:     Effort: Pulmonary effort is normal.     Breath sounds: Normal breath sounds.  Abdominal:     General: Abdomen is flat.     Palpations: Abdomen is soft.     Tenderness: There is no abdominal tenderness.  Musculoskeletal:     Cervical back: Normal range of motion and neck supple.     Right lower leg: No edema.     Left lower leg: No edema.  Skin:    General: Skin is warm and dry.  Neurological:     Comments: Drowsy, does not open eyes but squeezes lids shut to  noxious stimuli Making incomprehensible sounds Withdraws to pain in all 4 extremities      ED Results / Procedures / Treatments   Labs (all labs ordered are listed, but only abnormal results are displayed) Labs Reviewed  URINALYSIS, ROUTINE W REFLEX MICROSCOPIC - Abnormal; Notable for the following components:      Result Value   Color, Urine AMBER (*)    APPearance CLOUDY (*)    Protein, ur 30 (*)    Nitrite POSITIVE (*)    Leukocytes,Ua LARGE (*)    WBC, UA >50 (*)    Bacteria, UA MANY (*)    Non Squamous Epithelial 0-5 (*)    All other components within normal limits  LACTIC ACID, PLASMA - Abnormal; Notable for the following components:   Lactic Acid, Venous 2.6 (*)    All other components within normal limits  I-STAT VENOUS BLOOD GAS, ED - Abnormal; Notable for the following components:   Calcium, Ion 1.10 (*)    All other components within normal limits  URINE CULTURE  CBC WITH DIFFERENTIAL/PLATELET  PROTIME-INR  COMPREHENSIVE METABOLIC PANEL  PHENYTOIN LEVEL, TOTAL  VALPROIC ACID LEVEL  LACTIC ACID, PLASMA  LEVETIRACETAM LEVEL  CBG MONITORING, ED  TROPONIN I (HIGH SENSITIVITY)  TROPONIN I (HIGH SENSITIVITY)    EKG EKG Interpretation  Date/Time:  Wednesday May 15 2022 13:20:14 EDT Ventricular Rate:  63 PR Interval:    QRS Duration: 89 QT Interval:  428 QTC Calculation: 439 R Axis:   -25 Text Interpretation: Normal sinus rhythm Abnormal R-wave progression, late transition LVH with secondary repolarization abnormality No significant change since last tracing Confirmed by Leanord Asal ([id number]) on 05/15/2022 1:23:47 PM  Radiology CT Head Wo Contrast  Result Date: 05/15/2022 CLINICAL DATA:  Altered mental status EXAM: CT HEAD WITHOUT CONTRAST TECHNIQUE: Contiguous axial images were obtained from the base of the skull through the vertex without intravenous contrast. RADIATION DOSE REDUCTION: This exam was performed according to the departmental  dose-optimization program which includes automated exposure control, adjustment of the mA and/or kV according to patient size and/or use of iterative reconstruction technique. COMPARISON:  02/15/2022 FINDINGS: Brain: No acute intracranial findings are seen. There are no signs of bleeding within the cranium. There is prominence of third and both lateral ventricles. Cortical sulci are prominent. There  is old encephalomalacia in left parietal cortex with no significant change. Vascular: Unremarkable. Skull: Unremarkable. Sinuses/Orbits: Unremarkable. Other: None. IMPRESSION: No acute intracranial findings are seen in noncontrast CT brain. Old left parietal infarct. Atrophy. Electronically Signed   By: Elmer Picker M.D.   On: 05/15/2022 14:07   DG Chest Port 1 View  Result Date: 05/15/2022 CLINICAL DATA:  Altered mental status EXAM: PORTABLE CHEST 1 VIEW COMPARISON:  02/15/2022 FINDINGS: Dual lead pacemaker as seen previously. Aortic atherosclerosis. No evidence of heart failure, edema or effusion. No consolidation or collapse. IMPRESSION: No active disease. Dual lead pacemaker. Aortic atherosclerosis. Electronically Signed   By: Nelson Chimes M.D.   On: 05/15/2022 14:00    Procedures Procedures    Medications Ordered in ED Medications  cefTRIAXone (ROCEPHIN) 1 g in sodium chloride 0.9 % 100 mL IVPB (has no administration in time range)  lactated ringers bolus 1,000 mL (has no administration in time range)    ED Course/ Medical Decision Making/ A&P Clinical Course as of 05/15/22 1506  Wed May 15, 2022  1412 No acute disease on CXR or CTH. VBG normal.  Upon reassessment, the patient's eyes are open but she is not tracking to verbal response.  She is spontaneously lifting her left arm but not following commands.  Remainder of labs pending at this time. [VK]  1458 UA positive for UTI which is likely the cause of her altered mental status.  Previous urines have been sensitive to ceftriaxone which  has been ordered.  Urine culture was sent.  She will be started on fluids for mildly elevated lactate. [VK]  1502 Stable 15 YOF with a chief complaint of AMS from SNF. More confused per caregivers there. Baseline confusion. She moans to pain here. Prior neuro notes states she would say her name. Has UTI. Lactic 2.6 Head CT WNL. Likely admit.  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 The complaint involves an extensive differential diagnosis and also carries with it a high risk of complications and morbidity.    The differential diagnosis includes ICH, mass effect, hypo or hyper glycemia, sepsis, infection, ACS, arrhythmia, electrolyte abnormality, seizure  Additional history obtained: Additional history obtained from EMS  and nursing home/care facility Records reviewed outpatient hematology and neurology records  ED Course and Reassessment: Patient was lethargic appearing on arrival and is protecting her airway breathing comfortably on room air.  She is clenching her mouth but has no eye gaze deviation and is hemodynamically stable making an active seizure unlikely but may be postictal.  She has no obvious external signs of trauma and no obvious new neurologic deficits, however with her blood thinner use I am concerned for ICH or mass effect.  She will have CT head in addition to labs, chest x-ray and EKG to evaluate for cause of her altered mental status.  She will have levels performed of her antiepileptics.  She will continue to be closely monitored.  Independent labs  interpretation:  The following labs were independently interpreted: UA  positive for UTI, lactate mildly elevated, CMP pending  Independent visualization of imaging: - I independently visualized the following imaging with scope of interpretation limited to determining acute life threatening conditions related to emergency care: CTH, CXR, which revealed no acute disease  Consultation: - Consulted or discussed management/test interpretation w/ external professional: Pending work up  Consideration for admission or further workup: Patient will require admission for AMS in the setting of UTI Social Determinants of health: N/A    Amount and/or Complexity of Data Reviewed Labs: ordered. Radiology: ordered.          Final Clinical Impression(s) / ED Diagnoses Final diagnoses:  Altered mental status, unspecified altered mental status type  Urinary tract infection without hematuria, site unspecified    Rx / DC Orders ED Discharge Orders     None         Kemper Durie, DO 05/15/22 1506

## 2022-05-15 NOTE — Assessment & Plan Note (Addendum)
Difficult to visualize ulcer given pain. WOC consulted and provided recommendations. - Monitor wound care per nursing

## 2022-05-15 NOTE — ED Provider Notes (Signed)
I evaluated patient at bedside.  Unfortunately she is continuing to have worsening of her lactic acid, altered mental status remains worse than her baseline.  Had long conversations with patient's daughter who confirmed full scope of treatment at this time would like to pursue aggressive antibiotics, admission.  Patient's GCS remained 10 on multiple evaluations in the emergency department and I believe proceeding with intubation would be not indicated at this time.  I discussed these findings with the daughter who agreed as patient had a extended ventilator course for similar presentation 2 months ago. Ultimately, patient required admission to the [facility], consultation with neurology and close care management while in the emergency department.   Tretha Sciara, MD 05/15/22 2340

## 2022-05-16 ENCOUNTER — Observation Stay (HOSPITAL_COMMUNITY): Payer: Medicare HMO

## 2022-05-16 ENCOUNTER — Other Ambulatory Visit: Payer: Self-pay

## 2022-05-16 ENCOUNTER — Encounter (HOSPITAL_COMMUNITY): Payer: Self-pay | Admitting: Student

## 2022-05-16 DIAGNOSIS — R4182 Altered mental status, unspecified: Secondary | ICD-10-CM | POA: Diagnosis not present

## 2022-05-16 DIAGNOSIS — S31000D Unspecified open wound of lower back and pelvis without penetration into retroperitoneum, subsequent encounter: Secondary | ICD-10-CM | POA: Diagnosis not present

## 2022-05-16 DIAGNOSIS — R569 Unspecified convulsions: Secondary | ICD-10-CM | POA: Diagnosis not present

## 2022-05-16 LAB — BASIC METABOLIC PANEL
Anion gap: 14 (ref 5–15)
BUN: 12 mg/dL (ref 8–23)
CO2: 24 mmol/L (ref 22–32)
Calcium: 9.4 mg/dL (ref 8.9–10.3)
Chloride: 100 mmol/L (ref 98–111)
Creatinine, Ser: 0.48 mg/dL (ref 0.44–1.00)
GFR, Estimated: >60 mL/min (ref >60)
Glucose, Bld: 122 mg/dL — ABNORMAL HIGH (ref 70–99)
Potassium: 4.1 mmol/L (ref 3.5–5.1)
Sodium: 138 mmol/L (ref 135–145)

## 2022-05-16 LAB — CBC
HCT: 42.7 % (ref 36.0–46.0)
Hemoglobin: 14.1 g/dL (ref 12.0–15.0)
MCH: 28.9 pg (ref 26.0–34.0)
MCHC: 33 g/dL (ref 30.0–36.0)
MCV: 87.5 fL (ref 80.0–100.0)
Platelets: 228 10*3/uL (ref 150–400)
RBC: 4.88 MIL/uL (ref 3.87–5.11)
RDW: 13.8 % (ref 11.5–15.5)
WBC: 6.9 10*3/uL (ref 4.0–10.5)
nRBC: 0 % (ref 0.0–0.2)

## 2022-05-16 LAB — PHENYTOIN LEVEL, TOTAL: Phenytoin Lvl: 19.1 ug/mL (ref 10.0–20.0)

## 2022-05-16 LAB — LEVETIRACETAM LEVEL: Levetiracetam Lvl: 23.4 ug/mL (ref 10.0–40.0)

## 2022-05-16 LAB — LACTIC ACID, PLASMA: Lactic Acid, Venous: 1.7 mmol/L (ref 0.5–1.9)

## 2022-05-16 LAB — TSH: TSH: 3.099 u[IU]/mL (ref 0.350–4.500)

## 2022-05-16 MED ORDER — OSMOLITE 1.5 CAL PO LIQD: 1000.0000 mL | ORAL | Status: DC

## 2022-05-16 MED ORDER — LACTATED RINGERS IV SOLN: INTRAVENOUS | Status: DC

## 2022-05-16 MED ORDER — METOPROLOL TARTRATE 12.5 MG HALF TABLET
12.5000 mg | ORAL_TABLET | Freq: Two times a day (BID) | ORAL | Status: DC
  Administered 2022-05-16 – 2022-05-18 (×5): 12.5 mg
  Filled 2022-05-16 (×5): qty 1

## 2022-05-16 MED ORDER — OSMOLITE 1.5 CAL PO LIQD
1000.0000 mL | ORAL | Status: DC
  Administered 2022-05-17: 1000 mL
  Filled 2022-05-16: qty 1000

## 2022-05-16 MED ORDER — FREE WATER
175.0000 mL | Status: DC
  Administered 2022-05-16 – 2022-05-18 (×13): 175 mL

## 2022-05-16 MED ORDER — FERROUS SULFATE 300 (60 FE) MG/5ML PO SOLN
300.0000 mg | Freq: Every day | ORAL | Status: DC
  Administered 2022-05-16 – 2022-05-17 (×2): 300 mg
  Filled 2022-05-16 (×3): qty 5

## 2022-05-16 MED ORDER — AMIODARONE HCL 200 MG PO TABS
200.0000 mg | ORAL_TABLET | Freq: Every day | ORAL | Status: DC
  Administered 2022-05-16 – 2022-05-18 (×3): 200 mg
  Filled 2022-05-16 (×3): qty 1

## 2022-05-16 NOTE — Progress Notes (Addendum)
Daily Progress Note Intern Pager: [phone]  Patient name: Kathleen Jensen Medical record number: [id number] Date of birth: [birth year] Age: 71 y.o. Gender: female  Primary Care Provider: Patient, No Pcp Per Consultants: Neurology Code Status: Full  Pt Overview and Major Events to Date:  10/18 - admitted  Assessment and Plan:  Sadiyah Jaelle Campanile is a 71 y.o. female who presented with AMS likely due to post-ictal state vs. Dilantin toxicity. Pertinent PMH/PSH includes seizure disorder, HTN, T2DM, hypothyroidism, HLD, CVA, and anemia.   * Altered mental status Stable. Continues to open eyes to speech, yell out in distress, and appears to be scared or in pain. Leading differential is postictal 2/2 seizure or dilantin toxicity. Lactic acid is trending up, believe this to be secondary to seizure. Consider abdominal process such as ischemia given abdominal distention with PEG tube in, objective appearance of pain, and increasing LA. Will also discontinue antibiotics given UA findings likely from chronic indwelling foley and infectious symptoms absent. - Trend lactic acid - D/c ceftriaxone - Obtain KUB at this time; if concerning findings or LA continues to increase, will proceed with CT abdomen with contrast - Serial abdominal exams, mental status checks - AM CBC/BMP - Continuous cardiac telemetry - Neurology following, appreciate recommendations - Vitals per routine  Seizures ([place]) Stable without further seizure activity in hospital. Post-ictal state could be contributing to AMS. Dilantin level also elevated; consider toxicity as above. - Neurology following, appreciate recs  - Consider MRI head if no improvement (patient has pacemaker) - Continue home Keppra 1 g twice daily (with considering going up to 2 g twice daily), phenytoin (100 mg TID, previously 200 mg BID at facility), and Depakote 500 mg 4 times daily, all IV per neuro  Sacral wound Difficult to visualize  ulcer given pain. WOC consulted. - F/u wound care consult, appreciate recommendations   FEN/GI: Tube feeds, half mIV LR PPx: No anticoagulation given suspected microhemorrhages per neurology, SCDs Dispo: Nursing facility  pending clinical improvement .  Subjective:  Unable to answer questions given AMS. Frequently cries out in distress; she appears either scared or in pain. Her speech in unintelligible.  Objective: Temp:  [97.3 F (36.3 C)-98.2 F (36.8 C)] 98.2 F (36.8 C) (10/19 0814) Pulse Rate:  [58-74] 62 (10/19 1000) Resp:  [10-22] 20 (10/19 1000) BP: (102-181)/(64-101) 144/64 (10/19 1000) SpO2:  [93 %-100 %] 100 % (10/19 1000) Weight:  [81.2 kg] 81.2 kg (10/19 0024) Physical Exam: General: Opens eyes to speech, lying in bed Skin: Warm, dry HEENT: NCAT, EOM grossly normal, midline nasal septum, producing a large amount of saliva; suction nearby Cardiac: Difficult to appreciate given frequent verbalizations Respiratory: Satting well on RA Abdominal: Distended appearance, cries out when touched, ? abdominal pain Neurological/Psychiatric: Not oriented, appears frightened, unable to hold conversation  Laboratory: Most recent CBC Lab Results  Component Value Date   WBC 6.9 05/16/2022   HGB 14.1 05/16/2022   HCT 42.7 05/16/2022   MCV 87.5 05/16/2022   PLT 228 05/16/2022   Most recent BMP    Latest Ref Rng & Units 05/16/2022    6:22 AM  BMP  Glucose 70 - 99 mg/dL 122   BUN 8 - 23 mg/dL 12   Creatinine 0.44 - 1.00 mg/dL 0.48   Sodium 135 - 145 mmol/L 138   Potassium 3.5 - 5.1 mmol/L 4.1   Chloride 98 - 111 mmol/L 100   CO2 22 - 32 mmol/L 24   Calcium 8.9 -  10.3 mg/dL 9.4    TSH 3.099  Ethelene Hal, MD 05/16/2022, 11:20 AM PGY-1, [place] Intern pager: [phone], text pages welcome Secure chat group [place]

## 2022-05-16 NOTE — Care Management Obs Status (Signed)
[place] NOTIFICATION   Patient Details  Name: Kathleen Jensen MRN: [id number] Date of Birth: 1950-08-19   Medicare Observation Status Notification Given:  Ernesta Amble, RN 05/16/2022, 10:02 PM

## 2022-05-16 NOTE — Progress Notes (Signed)
LTM EEG hooked up and running - no initial skin breakdown - push button tested - neuro notified. New Leads used

## 2022-05-16 NOTE — Progress Notes (Signed)
MEDICATION RELATED CONSULT NOTE - INITIAL   Pharmacy Consult for Phenytoin  Indication: Seizures, continuation of PTA therapy  Allergies  Allergen Reactions   Tape Rash   Demerol [Meperidine Hcl] Nausea And Vomiting   Sulfonamide Derivatives Itching   Bacitracin-Polymyxin B Dermatitis   Oxycontin [Oxycodone] Itching    Patient Measurements: Height: 5\' 5"  (165.1 cm) Weight: 81.2 kg (179 lb 0.2 oz) IBW/kg (Calculated) : 57  Vital Signs: Temp: 97.7 F (36.5 C) (10/19 0400) Temp Source: Temporal (10/19 0400) BP: 135/85 (10/19 0400) Pulse Rate: 60 (10/19 0400) Intake/Output from previous day: 10/18 0701 - 10/19 0700 In: 1050 [IV Piggyback:1050] Out: 2600 [Urine:2600] Intake/Output from this shift: Total I/O In: 1050 [IV Piggyback:1050] Out: 2600 [Urine:2600]  Labs: Recent Labs    05/15/22 1348 05/15/22 1349  WBC  --  9.3  HGB 14.3 13.5  HCT 42.0 43.1  PLT  --  207  CREATININE  --  0.57  ALBUMIN  --  2.8*  PROT  --  8.9*  AST  --  40  ALT  --  21  ALKPHOS  --  89  BILITOT  --  0.3   Estimated Creatinine Clearance: 67.9 mL/min (by C-G formula based on SCr of 0.57 mg/dL).   Microbiology: No results found for this or any previous visit (from the past 720 hour(s)).  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 Per tube anti-seizure meds converted to IV. Total phenytoin level noted to be 21 (corrected ~24-25). Ok per neuro to check phenytoin level now and resume IV phenytoin at reduced dose once level allows.  Goal of Therapy:  Phenytoin level 10-20   Plan:  Check Phenytoin level now Resume dosing as level allows  Kathleen Jensen, PharmD, [place] Pharmacist Phone: [phone]

## 2022-05-16 NOTE — Progress Notes (Signed)
EEG complete - results pending 

## 2022-05-16 NOTE — Progress Notes (Signed)
FMTS Interim Progress Note  S: We were paged by nursing staff that patient was crying out for help.  I evaluated the patient at bedside with Dr. Oleh Genin.  Patient is awake and alert, most of her words are incomprehensible.  We believe she was saying "I want to go home."   O: BP 135/85 (BP Location: Right Arm)   Pulse 60   Temp 97.7 F (36.5 C) (Temporal)   Resp 18   Ht 5\' 5"  (1.651 m)   Wt 81.2 kg   SpO2 97%   BMI 29.79 kg/m   General: Not in acute distress Respiratory: Breathing comfortably on room air  A/P: Agitation Patient is calm with persistent reassurance.  Difficult to assess if patient is in pain due to relative nonverbal baseline.  We would like to avoid sedating medications at this time, due to AMS.  This patient may benefit from having family member spend the night. - We will reach out to family - Please contact family medicine service if patient's status changes  Leslie Dales, DO 05/16/2022, 5:08 AM PGY-1, [place] Medicine Service pager [phone]

## 2022-05-16 NOTE — Procedures (Signed)
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 Staff witnessed an episode of unresponsiveness with oral secretions concerning for a possible breakthrough seizure. EEG to evaluate for seizure.   Level of alertness: Awake  AEDs during EEG study: LEV, VPA  Technical aspects: This EEG study was done with scalp electrodes positioned according to the 10-20 International system of electrode placement. Electrical activity was reviewed with band pass filter of 1-70Hz , sensitivity of 7 uV/mm, display speed of 28mm/sec with a 60Hz  notched filter applied as appropriate. EEG data were recorded continuously and digitally stored.  Video monitoring was available and reviewed as appropriate.  Description: No clear posterior dominant rhythm was seen. EEG showed continuous 3 to 6 Hz theta-delta slowing. Spike were noted in left posterior quadrant. Hyperventilation and photic stimulation were not performed.    Of note, parts of study were difficult to interpret due to significant movement and electrode artifact.   ABNORMALITY - Spike,left posterior quadrant - Continuous slow, generalized  IMPRESSION: This study showed evidence of potential epileptogenicity arising from left posterior quadrant. Additionally there is moderate diffuse encephalopathy, nonspecific etiology. No seizures were seen throughout the recording.  Dr. Leonel Ramsay was notified.  Kathleen Jensen [name]

## 2022-05-16 NOTE — ED Notes (Signed)
This RN went to start pt's Keppra and pt's L FA IV appeared to be infiltrated. IV removed. IV team placed.

## 2022-05-16 NOTE — ED Notes (Signed)
Call [place] for updates at [phone]

## 2022-05-16 NOTE — Progress Notes (Signed)
[facility] and clarified tube feeds. She receives Osmolite 1.5 @ 75 mL/hr from 8 AM until 2 AM and has a 6 hours break. She receives free water flushes at 175 mL every 4 hours.

## 2022-05-16 NOTE — Consult Note (Signed)
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  Dressing procedure/placement/frequency: No disposable briefs.  Cleanse sacral wound with NS and pat dry. FIll with aquacel Ag (LAWSON# [id number]) and cover with gauze and silicone foam.  Change every other day.  Low air loss mattress Offload heels Turn and reposition Will not follow at this time.  Please re-consult if needed.  Domenic Moras MSN, RN, FNP-BC CWON Wound, Ostomy, Continence Nurse Pager [phone]

## 2022-05-16 NOTE — Progress Notes (Signed)
FMTS Brief Progress Note  S: I evaluated the patient at bedside with Dr. Oleh Genin.  [name] was at bedside.  Patient is awake and alert, interactive with others-1-2 comprehensible words, however are out of context.  Daughter reports patient is close to baseline.  O: BP 122/65   Pulse 60   Temp 98.1 F (36.7 C) (Oral)   Resp 18   Ht 5\' 5"  (1.651 m)   Wt 81.2 kg   SpO2 97%   BMI 29.79 kg/m   General: Not in acute distress, chronically ill-appearing Respiratory: Normal work of breathing on room air GI: Soft, no change in size from yesterday, nontympanic.  Difficult to assess if patient is tender in stomach or having pain in sacrum-some grimacing with left upper quadrant palpation. Neuro: Awake and alert, incomprehensible speech but interactive with others.  Moves all 4 extremities  A/P: Abdominal discomfort Exam without acute abdomen.  Minimal grimacing with left upper quadrant palpation.  No change in abdomen size from admission yesterday. - We will continue to monitor - Continue bowel regimen - If condition changes, please contact family medicine team  Leslie Dales, DO 05/16/2022, 8:35 PM PGY-1, [place] [facility] Night Resident  Please page [phone] with questions.

## 2022-05-16 NOTE — Progress Notes (Signed)
Spoke with patient's home, [facility], [phone], about her condition before coming to the hospital.  It appears she was more lethargic, she also had more congestion that needed to be suctioned.  This was different than her baseline which is typically appearing more frightened and in pain.  Nurse at the time thought that she may have had some seizure-like activity.    Her nurse states that she takes everything by G-tube.  She currently takes Osmolite 1.5 for nutrition.  She also takes amiodarone 200 mg daily, aspirin 81 mg daily, ferrous sulfate, metoprolol tartrate 25 mg half tab twice daily, Dilantin 125 mg per 5 mL with a total of 8 mL twice daily, Lyrica 75 mg 3 times daily, Atarax 5 mL 3 times daily, Keppra 100 mg/mL with a total of 10 mL twice daily, simvastatin 10 mg nightly, valproic acid 250 mg per 5 mL with a total of 10 mL 4 times daily.    Appreciate assistance from her nurse at the facility.

## 2022-05-16 NOTE — ED Notes (Addendum)
Admitting MD to bedside. Oral care given

## 2022-05-16 NOTE — Progress Notes (Signed)
Patient is somewhat improved, but agitated. She is able to move all of her extremities, she engages and follows some commands.  She is very agitated and does not cooperate well with exam.  Her AMS could be at least partially related to Dilantin, given that she is on both Depakote and Dilantin there is an interaction and therefore her free levels might be significantly higher than her total levels would suggest.  She does have some spikes on EEG, so as we are titrating down on her medications, I would favor EEG monitoring for the time being.  1) continue holding Dilantin until level is approaching 10 as previously recommended 2) continue Depacon 500 every 6 hours 3) continue Keppra 1 g every 12 hours 4) neurology to continue to follow  Roland Rack, MD [facility] [phone]  If 7pm- 7am, please page neurology on call as listed in [place].

## 2022-05-16 NOTE — ED Notes (Signed)
Patient started to cry out help me, MD paged.

## 2022-05-16 NOTE — Progress Notes (Signed)
MEDICATION RELATED CONSULT NOTE - INITIAL   Pharmacy Consult for Phenytoin  Indication: Seizures, continuation of PTA therapy  Allergies  Allergen Reactions   Tape Rash   Demerol [Meperidine Hcl] Nausea And Vomiting   Sulfonamide Derivatives Itching   Bacitracin-Polymyxin B Dermatitis   Oxycontin [Oxycodone] Itching    Patient Measurements: Height: 5\' 5"  (165.1 cm) Weight: 81.2 kg (179 lb 0.2 oz) IBW/kg (Calculated) : 57  Vital Signs: Temp: 97.7 F (36.5 C) (10/19 0400) Temp Source: Temporal (10/19 0400) BP: 135/80 (10/19 0700) Pulse Rate: 61 (10/19 0700) Intake/Output from previous day: 10/18 0701 - 10/19 0700 In: 1050 [IV Piggyback:1050] Out: 2600 [Urine:2600] Intake/Output from this shift: Total I/O In: 53 [IV Piggyback:53] Out: -   Labs: Recent Labs    05/15/22 1348 05/15/22 1349 05/16/22 0622  WBC  --  9.3 6.9  HGB 14.3 13.5 14.1  HCT 42.0 43.1 42.7  PLT  --  207 228  CREATININE  --  0.57 0.48  ALBUMIN  --  2.8*  --   PROT  --  8.9*  --   AST  --  40  --   ALT  --  21  --   ALKPHOS  --  89  --   BILITOT  --  0.3  --     Estimated Creatinine Clearance: 67.9 mL/min (by C-G formula based on SCr of 0.48 mg/dL).   Microbiology: No results found for this or any previous visit (from the past 720 hour(s)).  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 Per tube anti-seizure meds converted to IV. Total phenytoin level noted to be 19 (corrected ~22). Ok per neuro to monitor phenytoin levels and resume IV phenytoin at reduced dose (100mg  TID) once level <10.   Goal of Therapy:  Phenytoin level 10-20   Plan:  Check Phenytoin level 10/20 AM Resume dosing as level allows   Billey Gosling, PharmD PGY1 Pharmacy Resident 10/19/20238:02 AM

## 2022-05-17 DIAGNOSIS — R451 Restlessness and agitation: Secondary | ICD-10-CM | POA: Diagnosis not present

## 2022-05-17 DIAGNOSIS — R4182 Altered mental status, unspecified: Secondary | ICD-10-CM | POA: Diagnosis present

## 2022-05-17 DIAGNOSIS — L89154 Pressure ulcer of sacral region, stage 4: Secondary | ICD-10-CM | POA: Diagnosis present

## 2022-05-17 DIAGNOSIS — R569 Unspecified convulsions: Secondary | ICD-10-CM | POA: Diagnosis not present

## 2022-05-17 DIAGNOSIS — E785 Hyperlipidemia, unspecified: Secondary | ICD-10-CM | POA: Diagnosis present

## 2022-05-17 DIAGNOSIS — Z833 Family history of diabetes mellitus: Secondary | ICD-10-CM | POA: Diagnosis not present

## 2022-05-17 DIAGNOSIS — I700 Atherosclerosis of aorta: Secondary | ICD-10-CM | POA: Diagnosis present

## 2022-05-17 DIAGNOSIS — I680 Cerebral amyloid angiopathy: Secondary | ICD-10-CM | POA: Diagnosis present

## 2022-05-17 DIAGNOSIS — E119 Type 2 diabetes mellitus without complications: Secondary | ICD-10-CM | POA: Diagnosis present

## 2022-05-17 DIAGNOSIS — Z79899 Other long term (current) drug therapy: Secondary | ICD-10-CM | POA: Diagnosis not present

## 2022-05-17 DIAGNOSIS — K219 Gastro-esophageal reflux disease without esophagitis: Secondary | ICD-10-CM | POA: Diagnosis present

## 2022-05-17 DIAGNOSIS — G9340 Encephalopathy, unspecified: Secondary | ICD-10-CM | POA: Diagnosis present

## 2022-05-17 DIAGNOSIS — I69351 Hemiplegia and hemiparesis following cerebral infarction affecting right dominant side: Secondary | ICD-10-CM | POA: Diagnosis not present

## 2022-05-17 DIAGNOSIS — E669 Obesity, unspecified: Secondary | ICD-10-CM | POA: Diagnosis present

## 2022-05-17 DIAGNOSIS — I69320 Aphasia following cerebral infarction: Secondary | ICD-10-CM | POA: Diagnosis not present

## 2022-05-17 DIAGNOSIS — Z86718 Personal history of other venous thrombosis and embolism: Secondary | ICD-10-CM | POA: Diagnosis not present

## 2022-05-17 DIAGNOSIS — L309 Dermatitis, unspecified: Secondary | ICD-10-CM | POA: Diagnosis present

## 2022-05-17 DIAGNOSIS — E854 Organ-limited amyloidosis: Secondary | ICD-10-CM | POA: Diagnosis present

## 2022-05-17 DIAGNOSIS — M1990 Unspecified osteoarthritis, unspecified site: Secondary | ICD-10-CM | POA: Diagnosis present

## 2022-05-17 DIAGNOSIS — Z931 Gastrostomy status: Secondary | ICD-10-CM | POA: Diagnosis not present

## 2022-05-17 DIAGNOSIS — G40909 Epilepsy, unspecified, not intractable, without status epilepticus: Secondary | ICD-10-CM | POA: Diagnosis present

## 2022-05-17 DIAGNOSIS — Z9884 Bariatric surgery status: Secondary | ICD-10-CM | POA: Diagnosis not present

## 2022-05-17 DIAGNOSIS — E039 Hypothyroidism, unspecified: Secondary | ICD-10-CM | POA: Diagnosis present

## 2022-05-17 DIAGNOSIS — T420X1D Poisoning by hydantoin derivatives, accidental (unintentional), subsequent encounter: Secondary | ICD-10-CM | POA: Diagnosis not present

## 2022-05-17 DIAGNOSIS — I10 Essential (primary) hypertension: Secondary | ICD-10-CM | POA: Diagnosis present

## 2022-05-17 DIAGNOSIS — Z87891 Personal history of nicotine dependence: Secondary | ICD-10-CM | POA: Diagnosis not present

## 2022-05-17 LAB — CBC
HCT: 37.9 % (ref 36.0–46.0)
Hemoglobin: 12.4 g/dL (ref 12.0–15.0)
MCH: 28.5 pg (ref 26.0–34.0)
MCHC: 32.7 g/dL (ref 30.0–36.0)
MCV: 87.1 fL (ref 80.0–100.0)
Platelets: 178 10*3/uL (ref 150–400)
RBC: 4.35 MIL/uL (ref 3.87–5.11)
RDW: 13.5 % (ref 11.5–15.5)
WBC: 8.4 10*3/uL (ref 4.0–10.5)
nRBC: 0 % (ref 0.0–0.2)

## 2022-05-17 LAB — URINE CULTURE

## 2022-05-17 LAB — COMPREHENSIVE METABOLIC PANEL
ALT: 21 U/L (ref 0–44)
AST: 38 U/L (ref 15–41)
Albumin: 2.6 g/dL — ABNORMAL LOW (ref 3.5–5.0)
Alkaline Phosphatase: 75 U/L (ref 38–126)
Anion gap: 9 (ref 5–15)
BUN: 11 mg/dL (ref 8–23)
CO2: 22 mmol/L (ref 22–32)
Calcium: 8.9 mg/dL (ref 8.9–10.3)
Chloride: 103 mmol/L (ref 98–111)
Creatinine, Ser: 0.37 mg/dL — ABNORMAL LOW (ref 0.44–1.00)
GFR, Estimated: >60 mL/min (ref >60)
Glucose, Bld: 80 mg/dL (ref 70–99)
Potassium: 3.5 mmol/L (ref 3.5–5.1)
Sodium: 134 mmol/L — ABNORMAL LOW (ref 135–145)
Total Bilirubin: 0.3 mg/dL (ref 0.3–1.2)
Total Protein: 8 g/dL (ref 6.5–8.1)

## 2022-05-17 LAB — PHOSPHORUS: Phosphorus: 3.4 mg/dL (ref 2.5–4.6)

## 2022-05-17 LAB — VALPROIC ACID LEVEL: Valproic Acid Lvl: 44 ug/mL — ABNORMAL LOW (ref 50.0–100.0)

## 2022-05-17 LAB — GLUCOSE, CAPILLARY
Glucose-Capillary: 88 mg/dL (ref 70–99)
Glucose-Capillary: 127 mg/dL — ABNORMAL HIGH (ref 70–99)
Glucose-Capillary: 85 mg/dL (ref 70–99)
Glucose-Capillary: 113 mg/dL — ABNORMAL HIGH (ref 70–99)

## 2022-05-17 LAB — MAGNESIUM
Magnesium: 1.5 mg/dL — ABNORMAL LOW (ref 1.7–2.4)
Magnesium: 1.7 mg/dL (ref 1.7–2.4)

## 2022-05-17 LAB — PHENYTOIN LEVEL, FREE AND TOTAL
Phenytoin, Free: 2.4 ug/mL — ABNORMAL HIGH (ref 1.0–2.0)
Phenytoin, Total: 17.4 ug/mL (ref 10.0–20.0)

## 2022-05-17 LAB — PHENYTOIN LEVEL, TOTAL: Phenytoin Lvl: 13.8 ug/mL (ref 10.0–20.0)

## 2022-05-17 MED ORDER — JUVEN PO PACK
1.0000 | PACK | Freq: Two times a day (BID) | ORAL | Status: DC
  Administered 2022-05-17 – 2022-05-18 (×3): 1
  Filled 2022-05-17 (×3): qty 1

## 2022-05-17 MED ORDER — PROSOURCE TF20 ENFIT COMPATIBL EN LIQD
60.0000 mL | Freq: Three times a day (TID) | ENTERAL | Status: DC
  Administered 2022-05-17 – 2022-05-18 (×4): 60 mL
  Filled 2022-05-17 (×4): qty 60

## 2022-05-17 MED ORDER — OSMOLITE 1.5 CAL PO LIQD
1000.0000 mL | ORAL | Status: DC
  Administered 2022-05-17 – 2022-05-18 (×3): 1000 mL

## 2022-05-17 MED ORDER — ORAL CARE MOUTH RINSE: 15.0000 mL | OROMUCOSAL | Status: DC | PRN

## 2022-05-17 MED ORDER — MAGNESIUM SULFATE IN D5W 1-5 GM/100ML-% IV SOLN
1.0000 g | Freq: Once | INTRAVENOUS | Status: AC
  Administered 2022-05-17: 1 g via INTRAVENOUS
  Filled 2022-05-17: qty 100

## 2022-05-17 MED ORDER — PHENYTOIN 125 MG/5ML PO SUSP
175.0000 mg | Freq: Two times a day (BID) | ORAL | Status: DC
  Administered 2022-05-17 – 2022-05-18 (×2): 175 mg
  Filled 2022-05-17 (×3): qty 8

## 2022-05-17 MED ORDER — ORAL CARE MOUTH RINSE
15.0000 mL | OROMUCOSAL | Status: DC
  Administered 2022-05-17 – 2022-05-18 (×6): 15 mL via OROMUCOSAL

## 2022-05-17 MED ORDER — MAGNESIUM SULFATE 50 % IJ SOLN: 1.0000 g | Freq: Once | INTRAMUSCULAR | Status: DC

## 2022-05-17 NOTE — Progress Notes (Addendum)
MEDICATION RELATED CONSULT NOTE - INITIAL   Pharmacy Consult for Phenytoin  Indication: Seizures, continuation of PTA therapy  Allergies  Allergen Reactions   Tape Rash   Demerol [Meperidine Hcl] Nausea And Vomiting   Sulfonamide Derivatives Itching   Bacitracin-Polymyxin B Dermatitis   Oxycontin [Oxycodone] Itching    Patient Measurements: Height: 5\' 5"  (165.1 cm) Weight: 82 kg (180 lb 12.4 oz) IBW/kg (Calculated) : 57  Vital Signs: Temp: 98.1 F (36.7 C) (10/20 0443) Temp Source: Axillary (10/20 0443) BP: 155/73 (10/20 0443) Pulse Rate: 60 (10/20 0443) Intake/Output from previous day: 10/19 0701 - 10/20 0700 In: 153 [IV Piggyback:153] Out: 850 [Urine:850] Intake/Output from this shift: Total I/O In: -  Out: 850 [Urine:850]  Labs: Recent Labs    05/15/22 1349 05/16/22 0622 05/17/22 0351  WBC 9.3 6.9 8.4  HGB 13.5 14.1 12.4  HCT 43.1 42.7 37.9  PLT 207 228 178  CREATININE 0.57 0.48 0.37*  MG  --   --  1.5*  PHOS  --   --  3.4  ALBUMIN 2.8*  --  2.6*  PROT 8.9*  --  8.0  AST 40  --  38  ALT 21  --  21  ALKPHOS 89  --  75  BILITOT 0.3  --  0.3    Estimated Creatinine Clearance: 68.2 mL/min (A) (by C-G formula based on SCr of 0.37 mg/dL (L)).   Microbiology: No results found for this or any previous visit (from the past 720 hour(s)).  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 Per tube anti-seizure meds converted to IV. Total phenytoin level noted to be 13.8 (corrected ~22 using Micromedex calculator). Ok per neuro to monitor phenytoin levels and resume IV phenytoin at reduced dose (100mg  TID) once level <10.   Goal of Therapy:  Phenytoin level 10-20   Plan:  Check Phenytoin level 10/21 AM Resume dosing as level allows   Billey Gosling, PharmD PGY1 Pharmacy Resident 10/20/20236:12 AM

## 2022-05-17 NOTE — Hospital Course (Addendum)
Kathleen Jensen is a 71 y.o. female presenting with AMS likely 2/2 breakthrough seizure at her nursing facility. Her PMH includes seizure disorder, HTN, T2DM, hypothyroidism, HLD, CVA, and anemia. A brief hospital course is below.   Altered mental status Baseline is interactive with others, 1-2 words, limited communication. GCS 10, opens eyes to speech, incomprehensible sounds, localizes to pain.  Daughter at bedside reports mother typically acts this way after seizures/previous urosepsis.  Leading differential is postictal 2/2 seizure versus urinary tract infection (see below).  Lactic acid is trending up, believe this to be secondary to seizure- less likely infectious due to minimal signs and symptoms of infection.  Chronically ill, multiple comorbidities, concerned that patient could decompensate-we will admit progressive. - Admit progressive, attending Dr. Owens Shark - Continuous cardiac telemetry - Neurology consult, recommendations appreciated - Trend lactic acid - Feeding and medications per tube - AM CBC/BMP - Vitals per routine - Frequent mental status checks - Limit sedating medications as able   Seizures ([place]) Unclear if post-ictal state contributing to altered mental status. Home medications include Keppra 1 g twice daily (with considering going up to 2 g twice daily), phenytoin 187.5 mg twice daily and Depakote 500 mg 4 times daily.  PT/OT/SLP at SNF.  At her last neurology visit she presented with progressive neurologic decline and due to concern of previous microhemorrhages a MRI was ordered (not completed).  Neurology had recommended against anticoagulation at this time if possible due to brain bleed/GI bleed risks. - Neurology consulted, appreciate recs - Hold on anticoagulation at this time - Consider MRI head (patient has pacemaker) - Awaiting neurology recommendations for seizure medications   Other chronic conditions Hypothyroidism: Continue home Synthroid, check  TSH GERD: Continue home PPI and H2 Sacral ulcer: Wound care consulted and provided recommendations over admission  Issues for follow up:

## 2022-05-17 NOTE — NC FL2 (Signed)
[place] LEVEL OF CARE SCREENING TOOL     IDENTIFICATION  Patient Name: Kathleen Jensen Birthdate: Oct 05, 1950 Sex: female Admission Date (Current Location): 05/15/2022  [facility] and [place] Number:  Herbalist and Address:  [facility] [place] [address] [place], [place] [address]      Provider Number: [id number]  Attending Physician Name and Address:  Martyn Malay, MD  Relative Name and Phone Number:       Current Level of Care: Hospital Recommended Level of Care: [place] Prior Approval Number:    Date Approved/Denied:   PASRR Number:    Discharge Plan: SNF    Current Diagnoses: Patient Active Problem List   Diagnosis Date Noted   Altered mental status 05/15/2022   Sacral wound 05/15/2022   Intracranial hemorrhage (HCC)    Pressure injury of skin 02/18/2022   Chronic indwelling Foley catheter 02/18/2022   PAF (paroxysmal atrial fibrillation) ([place]) 02/18/2022   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[place]) 02/18/2022   Septic shock ([place]) 02/15/2022   Constipation    Abdominal pain    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   Right hemiplegia ([place]) 04/12/2020   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   Hyperlipidemia associated with type 2 diabetes mellitus ([place]) 07/30/2018   History of cerebral infarction 07/30/2018   Vitamin B 12 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/30/2018   Asthma 03/13/2018   Seizures ([place]) 01/12/2018   CVA (cerebral vascular accident) ([place]) 12/08/2017   Abnormal thyroid blood test 12/08/2017   Pulmonary embolism and infarction ([place]) 12/08/2017   Anemia 12/08/2017   Expressive aphasia 10/15/2017   Seizure disorder ([place]) 10/15/2017   Dysphagia 10/15/2017   Diabetes mellitus without complication ([place])    Hyperlipidemia 09/22/2017   Urinary frequency 09/22/2017   Airway hyperreactivity 01/26/2016   Disease of thyroid gland 01/26/2016   Artificial  cardiac pacemaker 01/26/2016   Other specified postprocedural states 01/26/2016   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   Chronic LBP 04/10/2015   Degeneration of intervertebral disc of lumbar region 04/10/2015   Spondylolisthesis of lumbar region 04/10/2015   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   History of cardiac pacemaker in situ 01/09/2015   B12 DEFICIENCY 08/15/2008   UNSPECIFIED VITAMIN D DEFICIENCY 08/15/2008   SINUS BRADYCARDIA 08/15/2008   OBSTRUCTIVE SLEEP APNEA 08/11/2008   INSOMNIA 04/28/2008   Hyperlipidemia LDL goal <70 12/12/2006   RHINITIS, ALLERGIC NEC 12/12/2006   Hypothyroidism 12/11/2006   Essential hypertension 12/11/2006   GERD 12/11/2006   STRESS INCONTINENCE 12/11/2006    Orientation RESPIRATION BLADDER Height & Weight      (unable to assess)  Normal Indwelling catheter Weight: 180 lb 12.4 oz (82 kg) Height:  5\' 5"  (165.1 cm)  BEHAVIORAL SYMPTOMS/MOOD NEUROLOGICAL BOWEL NUTRITION STATUS      Incontinent Feeding tube  AMBULATORY STATUS COMMUNICATION OF NEEDS Skin   Extensive Assist Non-Verbally Normal                       Personal Care Assistance Level of Assistance  Bathing, Feeding, Dressing Bathing Assistance: Maximum assistance Feeding assistance: Maximum assistance Dressing Assistance: Maximum assistance     Functional Limitations Info  Speech     Speech Info: Impaired (expressive aphasia, incomprehensible)    SPECIAL CARE FACTORS FREQUENCY                       Contractures Contractures Info: Not present    Additional  Factors Info  Code Status, Allergies Code Status Info: Full Allergies Info: Tape, Demerol (Meperidine Hcl), Sulfonamide Derivatives, Bacitracin-polymyxin B, Oxycontin (Oxycodone)           Current Medications (05/17/2022):  This is the current hospital active medication list Current Facility-Administered Medications  Medication Dose Route Frequency Provider Last Rate Last Admin   amiodarone  (PACERONE) tablet 200 mg  200 mg Per Tube Daily Evette Georges, MD   200 mg at 05/16/22 1402   aspirin chewable tablet 81 mg  81 mg Per Tube Daily Francena Hanly, RPH   81 mg at 05/16/22 1045   famotidine (PEPCID) tablet 40 mg  40 mg Per Tube Daily Francena Hanly, RPH   40 mg at 05/16/22 1045   feeding supplement (OSMOLITE 1.5 CAL) liquid 1,000 mL  1,000 mL Per Tube Continuous Levin Erp, MD 75 mL/hr at 05/17/22 0814 1,000 mL at 05/17/22 0814   ferrous sulfate 300 (60 Fe) MG/5ML syrup 300 mg  300 mg Per Tube QHS Evette Georges, MD   300 mg at 05/16/22 2338   folic acid (FOLVITE) tablet 1 mg  1 mg Per Tube Daily Francena Hanly, RPH   1 mg at 05/16/22 1045   free water 175 mL  175 mL Per Tube Q4H Levin Erp, MD   175 mL at 05/17/22 0814   hydrOXYzine (ATARAX) tablet 10 mg  10 mg Per Tube TID Francena Hanly, RPH   10 mg at 05/16/22 2209   levETIRAcetam (KEPPRA) IVPB 1000 mg/100 mL premix  1,000 mg Intravenous Q12H Tiffany Kocher, DO   Stopped at 05/16/22 2330   levothyroxine (SYNTHROID) tablet 75 mcg  75 mcg Per Tube QAC breakfast Lilland, Alana, DO   75 mcg at 05/17/22 0509   metoprolol tartrate (LOPRESSOR) tablet 12.5 mg  12.5 mg Per Tube BID Evette Georges, MD   12.5 mg at 05/16/22 2210   Oral care mouth rinse  15 mL Mouth Rinse 4 times per day Westley Chandler, MD       Oral care mouth rinse  15 mL Mouth Rinse PRN Westley Chandler, MD       pantoprazole (PROTONIX) 2 mg/mL oral suspension 40 mg  40 mg Per Tube Daily Francena Hanly, RPH       senna-docusate (Senokot-S) tablet 2 tablet  2 tablet Per Tube BID Lilland, Alana, DO   2 tablet at 05/16/22 2210   simethicone (MYLICON) chewable tablet 80 mg  80 mg Per Tube BID Francena Hanly, RPH   80 mg at 05/16/22 2210   simvastatin (ZOCOR) tablet 10 mg  10 mg Per Tube QHS Lilland, Alana, DO   10 mg at 05/16/22 2210   valproate (DEPACON) 500 mg in dextrose 5 % 50 mL IVPB  500 mg Intravenous Q6H Tiffany Kocher, DO 55 mL/hr at 05/17/22  0502 500 mg at 05/17/22 0502     Discharge Medications: Please see discharge summary for a list of discharge medications.  Relevant Imaging Results:  Relevant Lab Results:   Additional Information SS#: [id number]  Baldemar Lenis, LCSW

## 2022-05-17 NOTE — Progress Notes (Signed)
LTM EEG discontinued - no skin breakdown at unhook.   

## 2022-05-17 NOTE — Progress Notes (Signed)
Maintenance done on Fp2 CZ, PZ, P8, O1  and A1. [facility] notified.

## 2022-05-17 NOTE — TOC Initial Note (Signed)
Transition of Care [facility]) - Initial/Assessment Note    Patient Details  Name: Kathleen Jensen MRN: [id number] Date of Birth: 1950-12-18  Transition of Care [facility]) CM/SW Contact:    Baldemar Lenis, LCSW Phone Number: 05/17/2022, 10:06 AM  Clinical Narrative:          Patient is long term resident at [facility] and can return when stable. [facility] can admit over the weekend if patient is stable. CSW to follow.         Expected Discharge Plan: Skilled Nursing Facility Barriers to Discharge: Continued Medical Work up   Patient Goals and CMS Choice Patient states their goals for this hospitalization and ongoing recovery are:: patient unable to participate in goal setting, not oriented CMS Medicare.gov Compare Post Acute Care list provided to:: Patient Represenative (must comment) Choice offered to / list presented to : Adult Children  Expected Discharge Plan and Services Expected Discharge Plan: Skilled Nursing Facility     Post Acute Care Choice: Skilled Nursing Facility Living arrangements for the past 2 months: Skilled Nursing Facility                                      Prior Living Arrangements/Services Living arrangements for the past 2 months: Skilled Nursing Facility Lives with:: Facility Resident Patient language and need for interpreter reviewed:: No Do you feel safe going back to the place where you live?: Yes      Need for Family Participation in Patient Care: Yes (Comment) Care giver support system in place?: Yes (comment)   Criminal Activity/Legal Involvement Pertinent to Current Situation/Hospitalization: No - Comment as needed  Activities of Daily Living Home Assistive Devices/Equipment: Wheelchair ADL Screening (condition at time of admission) Patient's cognitive ability adequate to safely complete daily activities?: No Is the patient deaf or have difficulty hearing?: No Does the patient have difficulty seeing, even when wearing  glasses/contacts?: No Does the patient have difficulty concentrating, remembering, or making decisions?: Yes Patient able to express need for assistance with ADLs?: No Does the patient have difficulty dressing or bathing?: Yes Independently performs ADLs?: No Communication: Dependent Is this a change from baseline?: Pre-admission baseline Dressing (OT): Dependent Grooming: Dependent Feeding: Dependent Bathing: Dependent Toileting: Dependent In/Out Bed: Dependent Does the patient have difficulty walking or climbing stairs?: Yes Weakness of Legs: Right Weakness of Arms/Hands: Right  Permission Sought/Granted Permission sought to share information with : Facility Industrial/product designer granted to share information with : Yes, Verbal Permission Granted     Permission granted to share info w AGENCY: [name]        Emotional Assessment   Attitude/Demeanor/Rapport: Unable to Assess Affect (typically observed): Unable to Assess   Alcohol / Substance Use: Not Applicable Psych Involvement: No (comment)  Admission diagnosis:  Altered mental status [R41.82] Urinary tract infection without hematuria, site unspecified [N39.0] Altered mental status, unspecified altered mental status type [R41.82] Patient Active Problem List   Diagnosis Date Noted   Altered mental status 05/15/2022   Sacral wound 05/15/2022   Intracranial hemorrhage (HCC)    Pressure injury of skin 02/18/2022   Chronic indwelling Foley catheter 02/18/2022   PAF (paroxysmal atrial fibrillation) (HCC) 02/18/2022   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HCC) 02/18/2022   Septic shock (HCC) 02/15/2022   Constipation    Abdominal pain    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   Right hemiplegia (HCC) 04/12/2020   DM (diabetes  mellitus) type II uncontrolled with eye manifestation 07/30/2018   Hyperlipidemia associated with type 2 diabetes mellitus ([place]) 07/30/2018   History of cerebral infarction  07/30/2018   Vitamin B 12 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/30/2018   Asthma 03/13/2018   Seizures ([place]) 01/12/2018   CVA (cerebral vascular accident) ([facility]) 12/08/2017   Abnormal thyroid blood test 12/08/2017   Pulmonary embolism and infarction ([place]) 12/08/2017   Anemia 12/08/2017   Expressive aphasia 10/15/2017   Seizure disorder ([place]) 10/15/2017   Dysphagia 10/15/2017   Diabetes mellitus without complication ([place])    Hyperlipidemia 09/22/2017   Urinary frequency 09/22/2017   Airway hyperreactivity 01/26/2016   Disease of thyroid gland 01/26/2016   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   Other specified postprocedural states 01/26/2016   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   Chronic LBP 04/10/2015   Degeneration of intervertebral disc of lumbar region 04/10/2015   Spondylolisthesis of lumbar region 04/10/2015   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   History of cardiac pacemaker in situ 01/09/2015   B12 DEFICIENCY 08/15/2008   UNSPECIFIED VITAMIN D DEFICIENCY 08/15/2008   SINUS BRADYCARDIA 08/15/2008   OBSTRUCTIVE SLEEP APNEA 08/11/2008   INSOMNIA 04/28/2008   Hyperlipidemia LDL goal <70 12/12/2006   RHINITIS, ALLERGIC NEC 12/12/2006   Hypothyroidism 12/11/2006   Essential hypertension 12/11/2006   GERD 12/11/2006   STRESS INCONTINENCE 12/11/2006   PCP:  Patient, No Pcp Per Pharmacy:   [facility] [address] [place] [address] Phone: [phone] Fax: [phone]  [facility] [address] [place] [address] Phone: [phone] Fax: [place] [address] Phone: [phone] Fax: [phone]     Social Determinants of Health (SDOH) Interventions Food Insecurity Interventions: Intervention Not Indicated Housing Interventions: Intervention Not Indicated Transportation  Interventions: Intervention Not Indicated Utilities Interventions: Intervention Not Indicated  Readmission Risk Interventions    02/18/2022    2:23 PM  Readmission Risk Prevention Plan  Transportation Screening Complete  PCP or Specialist Appt within 5-7 Days Complete  Home Care Screening Complete  Medication Review (RN CM) Complete

## 2022-05-17 NOTE — Progress Notes (Signed)
LTM maint complete - no skin breakdown Patient moved from Trauma unit to another room in ED.  All leads on

## 2022-05-17 NOTE — Progress Notes (Signed)
Initial Nutrition Assessment  DOCUMENTATION CODES:   Obesity unspecified  INTERVENTION:  - Modify TF regimen to Osmolite 1.5 @ 70 mL/hr + Prosource TF20 TID + Juven BID.  - Current FWF: 175 mL Q4H   This will provide: 2760 kcals, 165 gm protein, and 1280 mL fluid. Total free water with flushes: 2330 mL.   NUTRITION DIAGNOSIS:   Increased nutrient needs related to wound healing as evidenced by estimated needs.  GOAL:   Patient will meet greater than or equal to 90% of their needs  MONITOR:   I & O's, Labs, TF tolerance  REASON FOR ASSESSMENT:   Consult Other (Comment), Enteral/tube feeding initiation and management (home TF)  ASSESSMENT:   71 y.o. female admits related to AMS. PMH includes: seizure disorder, HTN, T2DM, hypothyroidism, HLD, CVA, anemia.  Meds include: Pepcid, ferrous sulfate, senokot. Labs reviewed: Na low, Mag low.   MD consult for management of TF. Pt is currently NPO with G-J tube to LUQ. Pt admits from SNF, pt receiving Osmolite 1.5 at SNF. Pt is currently ordered to receive feeds at 75 mL/hr. Pt with increased nutrient needs r/t wound healing in setting of Stage IV pressure injury. RD will modify TF regimen to provide more protein. Will also continue to monitor Na levels. Pt was receiving TF @ 75 mL/hr at time of assessment.   NUTRITION - FOCUSED PHYSICAL EXAM:  Flowsheet Row Most Recent Value  Orbital Region No depletion  Upper Arm Region No depletion  Thoracic and Lumbar Region No depletion  Buccal Region No depletion  Temple Region No depletion  Clavicle Bone Region No depletion  Clavicle and Acromion Bone Region No depletion  Scapular Bone Region No depletion  Dorsal Hand No depletion  Patellar Region No depletion  Anterior Thigh Region No depletion  Posterior Calf Region No depletion  Edema (RD Assessment) None  Hair Reviewed  Eyes Reviewed  Mouth Reviewed  Skin Reviewed  Nails Reviewed      Diet Order:   Diet Order              Diet NPO time specified  Diet effective now                   EDUCATION NEEDS:   Not appropriate for education at this time  Skin:  Skin Assessment: Reviewed RN Assessment  Last BM:  05/17/22  Height:   Ht Readings from Last 1 Encounters:  05/16/22 5\' 5"  (1.651 m)    Weight:   Wt Readings from Last 1 Encounters:  05/16/22 82 kg    Ideal Body Weight:  56.8 kg  BMI:  Body mass index is 30.08 kg/m.  Estimated Nutritional Needs:   Kcal:  6144-3154 kcals  Protein:  125-165 gm  Fluid:  >/= 2.4 L  Thalia Bloodgood, RD, LDN, CNSC.

## 2022-05-17 NOTE — Procedures (Addendum)
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 Staff witnessed an episode of unresponsiveness with oral secretions concerning for a possible breakthrough seizure. EEG to evaluate for seizure.    Level of alertness: Awake   AEDs during EEG study: LEV, VPA   Technical aspects: This EEG study was done with scalp electrodes positioned according to the 10-20 International system of electrode placement. Electrical activity was reviewed with band pass filter of 1-70Hz , sensitivity of 7 uV/mm, display speed of 24mm/sec with a 60Hz  notched filter applied as appropriate. EEG data were recorded continuously and digitally stored.  Video monitoring was available and reviewed as appropriate.   Description: No clear posterior dominant rhythm was seen. EEG showed continuous 3 to 6 Hz theta-delta slowing. Spike were noted in left posterior quadrant. Hyperventilation and photic stimulation were not performed.     ABNORMALITY - Spike,left posterior quadrant - Continuous slow, generalized   IMPRESSION: This study showed evidence of potential epileptogenicity arising from left posterior quadrant. Additionally there is moderate diffuse encephalopathy, nonspecific etiology. No seizures were seen throughout the recording.  Kathleen Jensen

## 2022-05-17 NOTE — Progress Notes (Signed)
Daily Progress Note Intern Pager: [phone]  Patient name: Kathleen Jensen Medical record number: [id number] Date of birth: 11-Oct-1950 Age: 71 y.o. Gender: female  Primary Care Provider: Patient, No Pcp Per Consultants: Neurology Code Status: Full   Pt Overview and Major Events to Date:  10/18 - admitted   Assessment and Plan:   Kathleen Jensen is a 71 y.o. female who presented with AMS likely due to post-ictal state vs. Dilantin toxicity. Pertinent PMH/PSH includes seizure disorder, HTN, T2DM, hypothyroidism, HLD, CVA, and anemia.   * Altered mental status Stable, appears to be at baseline now per nursing facility and family. LA trended down 8.5>1.7. KUB negative. EEG as below; seizure vs dilantin toxicity leading diagnosis. - Continue EEG, meds per neurology, appreciate recs - AM CBC/BMP - Continuous cardiac telemetry - Vitals per routine  Seizures ([place]) Stable without further seizure activity in hospital. EEG with potential epileptogenicity but without seizure. Dilantin level also elevated; consider toxicity as above. - Neurology following, appreciate recs  - Consider MRI head if no improvement (patient has pacemaker) - Continue home Keppra 1 g BID and Depakote 500 mg Q6H - Holding dilantin until level approaching 10 per neurology  Sacral wound Difficult to visualize ulcer given pain. WOC consulted and provided recommendations. 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 She is smiling and not frightened appearing.  Objective: Temp:  [97.9 F (36.6 C)-98.5 F (36.9 C)] 98.1 F (36.7 C) (10/20 0443) Pulse Rate:  [59-70] 60 (10/20 0443) Resp:  [13-26] 17 (10/20 0443) BP:  (102-155)/(50-81) 155/73 (10/20 0443) SpO2:  [95 %-100 %] 99 % (10/20 0443) Weight:  [82 kg] 82 kg (10/19 2338) Physical Exam: General: Alert, smiling, in NAD, cEEG in place Skin: Warm, dry HEENT: NCAT, EOM grossly normal, midline nasal septum Cardiac: RRR, no m/r/g appreciated Respiratory: CTAB anteriorly, breathing and speaking comfortably on RA Abdominal: Soft, mildly TTP throughout, distended Extremities: Moves upper extremities grossly equally in bed, waves at [place] with L hand Neurological: Aphasic when communicating Psychiatric: Appropriate mood and affect   Laboratory: Most recent CBC Lab Results  Component Value Date   WBC 8.4 05/17/2022   HGB 12.4 05/17/2022   HCT 37.9 05/17/2022   MCV 87.1 05/17/2022   PLT 178 05/17/2022   Most recent BMP    Latest Ref Rng & Units 05/17/2022    3:51 AM  BMP  Glucose 70 - 99 mg/dL 80   BUN 8 - 23 mg/dL 11   Creatinine 0.44 - 1.00 mg/dL 0.37   Sodium 135 - 145 mmol/L 134   Potassium 3.5 - 5.1 mmol/L 3.5   Chloride 98 - 111 mmol/L 103   CO2 22 - 32 mmol/L 22   Calcium 8.9 - 10.3 mg/dL 8.9    Mg 1.5 Phos 3.4 Valproic acid level low 44 Phenytoin level total normal 13.8 (corrects to 22 per pharmacy)  Imaging/Diagnostic Tests:  EEG IMPRESSION: This study showed evidence of potential epileptogenicity arising from left posterior quadrant. Additionally there is moderate diffuse encephalopathy, nonspecific etiology. No seizures were seen throughout the recording.  Ethelene Hal, MD 05/17/2022, 7:52 AM PGY-1, [place] Intern pager: [phone], text pages welcome Secure chat group [place]

## 2022-05-17 NOTE — Progress Notes (Signed)
FMTS Brief Progress Note  S:Received page from patient's nurse that she was moaning out in pain. Upon arriving to the room, patient was initially moaning with some word-like utterances with eyes closed, easily awoken to voice. When asked if she was having any pain patient said no and shook her head.  Nursing reports that last night patient would moan when awoken and touched but not when left alone, so this is a change for them.   O: BP (!) 168/97 (BP Location: Right Arm)   Pulse 66   Temp 98.2 F (36.8 C) (Axillary)   Resp 19   Ht 5\' 5"  (1.651 m)   Wt 82 kg   SpO2 98%   BMI 30.08 kg/m   General: elderly, appears chronically ill, intermittently making groaning/moaning sounds CV: RRR on telemetry Abdomen: soft, non-tender, non-distended    A/P: Agitation? Patient with audible sounds consistently even with eyes close. Is more engaged that previous nights and shook head and said no when asked if having pain or if pain when palpating her abdomen. Given her negative responses, at this time will continue to monitor. If this continues or patient seems more agitated we may consider one dose of Tylenol to see if there is any improvement.   Rise Patience, DO 05/17/2022, 8:57 PM PGY-3, [place] [facility] Night Resident  Please page [phone] with questions.

## 2022-05-17 NOTE — Progress Notes (Signed)
Pt was moved overnight pt not online placed EEG study back on server.

## 2022-05-17 NOTE — Plan of Care (Signed)
  Problem: Clinical Measurements: Goal: Will remain free from infection Outcome: Progressing Goal: Respiratory complications will improve Outcome: Progressing   Problem: Activity: Goal: Risk for activity intolerance will decrease Outcome: Progressing   Problem: Coping: Goal: Level of anxiety will decrease Outcome: Progressing   

## 2022-05-17 NOTE — Progress Notes (Addendum)
Neurology Progress Note   S:// Patient is awake and alert. She is not answering questions, attempts to speak but has incomprehensible words and sounds and her voice continues to rise throughout exam. She does become more agitated as exam goes on. She does not follow commands. LTM EEG with no seizures. No family @ the bedside.    O:// Current vital signs: BP (!) 162/88 (BP Location: Right Arm)   Pulse 64   Temp 98.5 F (36.9 C)   Resp 18   Ht 5\' 5"  (1.651 m)   Wt 82 kg   SpO2 98%   BMI 30.08 kg/m  Vital signs in last 24 hours: Temp:  [97.9 F (36.6 C)-98.6 F (37 C)] 98.5 F (36.9 C) (10/20 1514) Pulse Rate:  [59-65] 64 (10/20 1514) Resp:  [14-26] 18 (10/20 1514) BP: (116-163)/(50-88) 162/88 (10/20 1514) SpO2:  [94 %-99 %] 98 % (10/20 1514) Weight:  [82 kg] 82 kg (10/19 2338)  GENERAL: Awake, alert in NAD HEENT: - Normocephalic and atraumatic, dry mm LUNGS - Clear to auscultation bilaterally with no wheezes CV - S1S2 RRR, no m/r/g, equal pulses bilaterally. ABDOMEN - Soft, nontender, nondistended with normoactive BS Ext: warm, well perfused, intact peripheral pulses, no edema  NEURO:  Mental Status: awake and alert, does not follow commands, incomprehensible words and sounds  Language: speech is incomprehensible  Cranial Nerves: PERRL mm/brisk. EOMI, visual fields inconsistent blink to threat, no facial asymmetry, facial sensation intact, hearing intact, tongue/uvula/soft palate midline, normal sternocleidomastoid and trapezius muscle strength. No evidence of tongue atrophy or fibrillations Motor: moves left arm spontaneously, withdraws on right arm, flickers on lowers bilaterally.  Sensation- Intact to light touch bilaterally Coordination: unable to assess Gait- deferred   Medications  Current Facility-Administered Medications:    amiodarone (PACERONE) tablet 200 mg, 200 mg, Per Tube, Daily, 2339, MD, 200 mg at 05/17/22 1046   aspirin chewable tablet 81 mg,  81 mg, Per Tube, Daily, 05/19/22, RPH, 81 mg at 05/17/22 1047   famotidine (PEPCID) tablet 40 mg, 40 mg, Per Tube, Daily, 05/19/22, RPH, 40 mg at 05/17/22 1047   feeding supplement (OSMOLITE 1.5 CAL) liquid 1,000 mL, 1,000 mL, Per Tube, Continuous, 05/19/22, MD, Last Rate: 70 mL/hr at 05/17/22 1306, 1,000 mL at 05/17/22 1306   feeding supplement (PROSource TF20) liquid 60 mL, 60 mL, Per Tube, TID, 05/19/22, MD   ferrous sulfate 300 (60 Fe) MG/5ML syrup 300 mg, 300 mg, Per Tube, QHS, Westley Chandler, MD, 300 mg at 05/16/22 2338   folic acid (FOLVITE) tablet 1 mg, 1 mg, Per Tube, Daily, 2339, RPH, 1 mg at 05/17/22 1047   free water 175 mL, 175 mL, Per Tube, Q4H, 05/19/22, MD, 175 mL at 05/17/22 1125   hydrOXYzine (ATARAX) tablet 10 mg, 10 mg, Per Tube, TID, 05/19/22, RPH, 10 mg at 05/17/22 1049   levETIRAcetam (KEPPRA) IVPB 1000 mg/100 mL premix, 1,000 mg, Intravenous, Q12H, 05/19/22, DO, Last Rate: 400 mL/hr at 05/17/22 1048, 1,000 mg at 05/17/22 1048   levothyroxine (SYNTHROID) tablet 75 mcg, 75 mcg, Per Tube, QAC breakfast, Lilland, Alana, DO, 75 mcg at 05/17/22 0509   metoprolol tartrate (LOPRESSOR) tablet 12.5 mg, 12.5 mg, Per Tube, BID, 05/19/22, MD, 12.5 mg at 05/17/22 1047   nutrition supplement (JUVEN) (JUVEN) powder packet 1 packet, 1 packet, Per Tube, BID BM, 05/19/22, MD, 1 packet at 05/17/22 1306   Oral care  mouth rinse, 15 mL, Mouth Rinse, 4 times per day, Westley Chandler, MD, 15 mL at 05/17/22 1125   Oral care mouth rinse, 15 mL, Mouth Rinse, PRN, Westley Chandler, MD   pantoprazole (PROTONIX) 2 mg/mL oral suspension 40 mg, 40 mg, Per Tube, Daily, Francena Hanly, RPH, 40 mg at 05/17/22 1046   phenytoin (DILANTIN) 125 MG/5ML suspension 175 mg, 175 mg, Per Tube, BID, Gevena Mart A, NP   senna-docusate (Senokot-S) tablet 2 tablet, 2 tablet, Per Tube, BID, Lilland, Alana, DO, 2 tablet at 05/17/22 1047    simethicone (MYLICON) chewable tablet 80 mg, 80 mg, Per Tube, BID, Francena Hanly, RPH, 80 mg at 05/17/22 1046   simvastatin (ZOCOR) tablet 10 mg, 10 mg, Per Tube, QHS, Lilland, Alana, DO, 10 mg at 05/16/22 2210   valproate (DEPACON) 500 mg in dextrose 5 % 50 mL IVPB, 500 mg, Intravenous, Q6H, Tiffany Kocher, DO, Last Rate: 55 mL/hr at 05/17/22 1124, 500 mg at 05/17/22 1124 Labs CBC    Component Value Date/Time   WBC 8.4 05/17/2022 0351   RBC 4.35 05/17/2022 0351   HGB 12.4 05/17/2022 0351   HCT 37.9 05/17/2022 0351   PLT 178 05/17/2022 0351   MCV 87.1 05/17/2022 0351   MCH 28.5 05/17/2022 0351   MCHC 32.7 05/17/2022 0351   RDW 13.5 05/17/2022 0351   LYMPHSABS 3.4 05/15/2022 1349   MONOABS 0.7 05/15/2022 1349   EOSABS 0.2 05/15/2022 1349   BASOSABS 0.1 05/15/2022 1349    CMP     Component Value Date/Time   NA 134 (L) 05/17/2022 0351   K 3.5 05/17/2022 0351   CL 103 05/17/2022 0351   CO2 22 05/17/2022 0351   GLUCOSE 80 05/17/2022 0351   GLUCOSE 122 (H) 07/07/2006 1222   BUN 11 05/17/2022 0351   CREATININE 0.37 (L) 05/17/2022 0351   CALCIUM 8.9 05/17/2022 0351   PROT 8.0 05/17/2022 0351   ALBUMIN 2.6 (L) 05/17/2022 0351   AST 38 05/17/2022 0351   ALT 21 05/17/2022 0351   ALKPHOS 75 05/17/2022 0351   BILITOT 0.3 05/17/2022 0351   GFRNONAA >60 05/17/2022 0351   GFRAA >60 04/14/2020 0105    glycosylated hemoglobin  Lipid Panel     Component Value Date/Time   CHOL 172 07/30/2018 1230   TRIG 101.0 07/30/2018 1230   TRIG 93 05/28/2006 1119   HDL 61.20 07/30/2018 1230   CHOLHDL 3 07/30/2018 1230   VLDL 20.2 07/30/2018 1230   LDLCALC 90 07/30/2018 1230     Imaging I have reviewed images in epic and the results pertinent to this consultation are:  CT-scan of the brain 10/18 no acute abnormality. 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 Staff witnessed an episode of unresponsiveness with oral secretions concerning for a possible breakthrough seizure  Impression: Break through seizures vs Dilantin toxicity   Recommendations: - Discontinue LTM  - Will restart dilantin 175 mg PO BID (decreased from home dose) - Continue Keppra and Depacon  - Neurology will continue to follow   Gevena Mart DNP, ACNPC-AG   I have seen the patient reviewed the above note.  My suspicion is that her presentation is primarily due to Dilantin, but with concern for seizures, we will continue EEG one more night, if no seizures  overnight can discontinue in the morning and resume Dilantin at a reduced dose.  Of note, she is apparently supposed to be getting 187.5 mg twice daily of Dilantin, but was getting 200 mg twice daily.  I would favor reducing her dose to 175 mg twice daily and she will need levels in a week or two.  Roland Rack, MD [facility] [phone]  If 7pm- 7am, please page neurology on call as listed in [place].

## 2022-05-18 DIAGNOSIS — T420X1D Poisoning by hydantoin derivatives, accidental (unintentional), subsequent encounter: Secondary | ICD-10-CM

## 2022-05-18 DIAGNOSIS — R4182 Altered mental status, unspecified: Secondary | ICD-10-CM | POA: Diagnosis not present

## 2022-05-18 DIAGNOSIS — R569 Unspecified convulsions: Secondary | ICD-10-CM | POA: Diagnosis not present

## 2022-05-18 LAB — MAGNESIUM: Magnesium: 1.8 mg/dL (ref 1.7–2.4)

## 2022-05-18 MED ORDER — CHLORHEXIDINE GLUCONATE CLOTH 2 % EX PADS: 6.0000 | MEDICATED_PAD | Freq: Every day | CUTANEOUS | Status: DC

## 2022-05-18 MED ORDER — PHENYTOIN 125 MG/5ML PO SUSP: 175.0000 mg | Freq: Two times a day (BID) | ORAL | 12 refills | Status: DC

## 2022-05-18 NOTE — Progress Notes (Signed)
No significant issues overnight, no seizures on EEG.   Exam is similar to yesterday, she withdraws her right arm, incomprehensible words.  At this point, seems that she is back to baseline, LTM with no seizures.  I suspect that she had Dilantin toxicity.  I would continue her current dose of Dilantin with levels to be rechecked in a week or two.  I would continue her other home meds.   Roland Rack, MD [facility] [phone]  If 7pm- 7am, please page neurology on call as listed in [place].

## 2022-05-18 NOTE — Procedures (Signed)
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 Staff witnessed an episode of unresponsiveness with oral secretions concerning for a possible breakthrough seizure. EEG to evaluate for seizure.    Level of alertness: Awake   AEDs during EEG study: LEV, VPA   Technical aspects: This EEG study was done with scalp electrodes positioned according to the 10-20 International system of electrode placement. Electrical activity was reviewed with band pass filter of 1-70Hz , sensitivity of 7 uV/mm, display speed of 81mm/sec with a 60Hz  notched filter applied as appropriate. EEG data were recorded continuously and digitally stored.  Video monitoring was available and reviewed as appropriate.   Description: No clear posterior dominant rhythm was seen. EEG showed continuous 3 to 6 Hz theta-delta slowing. Spike were noted in left posterior quadrant. Hyperventilation and photic stimulation were not performed.     ABNORMALITY - Spike,left posterior quadrant - Continuous slow, generalized   IMPRESSION: This study showed evidence of potential epileptogenicity arising from left posterior quadrant. Additionally there is moderate diffuse encephalopathy, nonspecific etiology. No seizures were seen throughout the recording.   Alexiz Cothran [name]

## 2022-05-18 NOTE — Discharge Summary (Addendum)
[facility] Discharge Summary  Patient name: Kathleen Jensen Medical record number: [id number] Date of birth: Nov 14, 1950 Age: 71 y.o. Gender: female Date of Admission: 05/15/2022  Date of Discharge: 05/18/2022 Admitting Physician: Leslie Dales, DO  Primary Care Provider: Patient, No Pcp Per Consultants: Neurology  Indication for Hospitalization: AMS, seizure disorder  Brief Hospital Course:  Kathleen Jensen is a 71 y.o. female presenting with AMS likely 2/2 breakthrough seizure at her nursing facility. Her PMH includes seizure disorder, HTN, T2DM, hypothyroidism, HLD, CVA, and anemia. A brief hospital course is below.   Altered mental status likely 2/2 breakthrough seizure vs dilantin toxicity Presented with GCS 10, crying out frequently, and appearing frightened or in pain to any touch. She was afebrile and hypertensive. CT head, CXR, abd XR unremarkable for acute process. UA colonized and multiple species per urine culture; ceftriaxone started but low suspicion for infection given normal WBC, absence of fever, and culture drawn from old catheter. (See below).  LA 2.6>8.5>1.7 after fluids (likely elevated in setting of seizure). Dilantin level elevated with normal keppra and low valproic acid levels. Neurology consulted; discussed possible breakthrough seizure vs dilantin toxicity. Dilantin stopped for a period and Keppra, Depakote continued. Serial EEGs with epileptogenic features without active seizure. By discharge, patient had improved mental status without further seizures on medication regimen. Neurology discharged on regimen of 500 mg valproate 4 times daily, phenytoin 175 mg BID, keppra 1000 mg BID.   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 She tolerated feeds and had normal bowel habits during stay. Abdominal film and exam reassuring (does grimace during exams when touching  abdomen, legs, or right arm).  Continue to monitor PEG tube site for infection  Hypothyroidism: Continue home Synthroid, check TSH GERD: Continue home PPI and H2 Sacral ulcer: Wound care consulted and provided recommendations Hx CVA: anticoagulation d/c given cerebral amyloid and microhemorrhaging risk; discussed with daughter  Issues for follow up: Ensure stable mental status with seizure medication regimen Will need dilantin levels in 1-2 weeks after discharge to ensure stable levels PEG tube site appears macerated with surrounding erythema and is painful to manipulation. This is likely irritant dermatitis. Monitor.  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 rate 19, height 5\' 5"  (1.651 m), weight 82 kg, SpO2 98 %.  General: Alert, in NAD Skin: Warm, dry, and intact; skin appears macerated around PEG tube site with some erythema and pain with manipulation, though no drainage or bleeding noted HEENT: NCAT, EOM grossly normal, midline nasal septum Cardiac: RRR, no m/r/g appreciated Respiratory: CTAB anteriorly, breathing comfortably on RA Abdominal: Soft, TTP diffusely, nondistended, normoactive bowel sounds; PEG tube painful to manipulation with skin maceration and mild erythema around site no warmth or signs of cellulitis  Neurological: Aphasic, moving her left arm passively Psychiatric: Appears frustrated with aphasia  Significant Labs and  Imaging:  Recent Labs  Lab 05/17/22 0351  WBC 8.4  HGB 12.4  HCT 37.9  PLT 178   Recent Labs  Lab 05/17/22 0351 05/17/22 1141 05/18/22 0519  NA 134*  --   --   K 3.5  --   --   CL 103  --   --   CO2 22  --   --   GLUCOSE 80  --   --   BUN 11  --   --   CREATININE 0.37*  --   --   CALCIUM 8.9  --   --   MG 1.5* 1.7 1.8  PHOS 3.4  --   --   ALKPHOS 75  --   --   AST 38  --   --   ALT 21  --   --   ALBUMIN 2.6*  --   --    CXR IMPRESSION: No active disease. Dual lead pacemaker. Aortic atherosclerosis.  CT head wo contrast IMPRESSION: No acute intracranial findings are seen in noncontrast CT brain. Old left parietal infarct. Atrophy.  Abd XR IMPRESSION: No acute abnormality.  Discharge Medications:  Allergies as of 05/18/2022       Reactions   Tape Rash   Demerol [meperidine Hcl] Nausea And Vomiting   Sulfonamide Derivatives Itching   Bacitracin-polymyxin B Dermatitis   Oxycontin [oxycodone] Itching        Medication List     STOP taking these medications    enoxaparin 40 MG/0.4ML injection Commonly known as: LOVENOX   methocarbamol 500 MG tablet Commonly known as: ROBAXIN   Omeprazole 20 MG Tbdd       TAKE these medications    acetaminophen 160 MG/5ML liquid Commonly known as: TYLENOL Place 960 mg into feeding tube every 8 (eight) hours.   Acidophilus Lactobacillus Caps Give 1 capsule by tube daily.   amiodarone 200 MG tablet Commonly known as: PACERONE 200 mg BID for 10 days then 200mg  daily for 21 days. Follow up with cardiology for further dosing What changed:  how much to take how to take this when to take this additional instructions   ascorbic acid 500 MG tablet Commonly known as: VITAMIN C Place 1 tablet (500 mg total) into feeding tube 2 (two) times daily. What changed: when to take this   aspirin EC 81 MG tablet 81 mg at bedtime. Via tube   baclofen 25 MG/5ML Susp 20 mg every 8 (eight) hours. Via tube    Biofreeze 4 % Gel Generic drug: Menthol (Topical Analgesic) Apply 2 g topically 2 (two) times daily. Upper left arm   Cholecalciferol 25 MCG (1000 UT) capsule Place 1,000 Units into feeding tube at bedtime.   famotidine 40 MG/5ML suspension Commonly known as: PEPCID Place 40 mg into feeding tube daily.   feeding supplement (JEVITY 1.2 CAL) Liqd Place 1,000 mLs into feeding tube continuous. At 62ml/hr   feeding supplement (PROSource TF) liquid Place 45 mLs into feeding tube 3 (three) times daily.   ferrous sulfate 220 (44 Fe) MG/5ML solution Take 220 mg by mouth at bedtime.   folic acid Q000111Q MCG tablet Commonly known as: FOLVITE Place 800 mcg into feeding tube daily.   free water Soln Place 100 mLs into feeding tube every 4 (four) hours.   hydrOXYzine 10 MG/5ML syrup Commonly known as: ATARAX Place 10 mg into feeding tube 3 (three) times daily.   levETIRAcetam 100 MG/ML solution Commonly known as: KEPPRA Place 1,000 mg into feeding tube 2 (two) times daily.   levothyroxine 75 MCG tablet Commonly known as: SYNTHROID Place 1 tablet (75  mcg total) into feeding tube daily before breakfast.   lidocaine 4 % Place 1 patch onto the skin daily. To lower back   metoprolol tartrate 25 MG tablet Commonly known as: LOPRESSOR Place 12.5 mg into feeding tube 2 (two) times daily. What changed: Another medication with the same name was removed. Continue taking this medication, and follow the directions you see here.   phenytoin 125 MG/5ML suspension Commonly known as: DILANTIN Place 7 mLs (175 mg total) into feeding tube 2 (two) times daily. What changed:  how much to take additional instructions   polyethylene glycol 17 g packet Commonly known as: MIRALAX / GLYCOLAX Place 34 g into feeding tube daily as needed for mild constipation.   pregabalin 75 MG capsule Commonly known as: LYRICA Take 75 mg by mouth 3 (three) times daily.   senna-docusate 8.6-50 MG tablet Commonly  known as: Senokot-S Place 2 tablets into feeding tube 2 (two) times daily.   Simethicone 80 MG Tabs 1 tablet (80 mg total) by PEG Tube route in the morning and at bedtime. What changed:  how to take this additional instructions   simvastatin 10 MG tablet Commonly known as: ZOCOR Place 1 tablet (10 mg total) into feeding tube at bedtime.   valproic acid 250 MG/5ML solution Commonly known as: DEPAKENE Place 10 mLs (500 mg total) into feeding tube 4 (four) times daily.   Zinc 50 MG Tabs Give 50 mg by tube daily.       Discharge Instructions: Please refer to Patient Instructions section of EMR for full details.  Patient was counseled important signs and symptoms that should prompt return to medical care, changes in medications, dietary instructions, activity restrictions, and follow up appointments.   Follow-Up Appointments:  Contact information for after-discharge care     Destination     [name] Preferred SNF .   Service: Skilled Nursing Contact information: [place] Pelzer Marshall, MD 05/18/2022, 2:16 PM PGY-1, [facility]  Upper Level Addendum:  I have seen and evaluated this patient along with Dr. Marcha Dutton and reviewed the above note, making necessary revisions as appropriate.  I agree with the medical decision making and physical exam as noted above.  Gerrit Heck, MD PGY-2 [facility]

## 2022-05-18 NOTE — Plan of Care (Signed)
  Problem: Clinical Measurements: Goal: Ability to maintain clinical measurements within normal limits will improve Outcome: Progressing Goal: Diagnostic test results will improve Outcome: Progressing Goal: Respiratory complications will improve Outcome: Progressing Goal: Cardiovascular complication will be avoided Outcome: Progressing   Problem: Nutrition: Goal: Adequate nutrition will be maintained Outcome: Progressing   Problem: Elimination: Goal: Will not experience complications related to bowel motility Outcome: Progressing Goal: Will not experience complications related to urinary retention Outcome: Progressing   Problem: Safety: Goal: Ability to remain free from injury will improve Outcome: Progressing   Problem: Skin Integrity: Goal: Risk for impaired skin integrity will decrease Outcome: Progressing   Problem: Education: Goal: Knowledge of General Education information will improve Description: Including pain rating scale, medication(s)/side effects and non-pharmacologic comfort measures Outcome: Not Progressing   Problem: Health Behavior/Discharge Planning: Goal: Ability to manage health-related needs will improve Outcome: Not Progressing   Problem: Clinical Measurements: Goal: Will remain free from infection Outcome: Not Progressing   Problem: Activity: Goal: Risk for activity intolerance will decrease Outcome: Not Progressing   Problem: Coping: Goal: Level of anxiety will decrease Outcome: Not Progressing   Problem: Pain Managment: Goal: General experience of comfort will improve Outcome: Not Progressing

## 2022-05-18 NOTE — Progress Notes (Signed)
     Daily Progress Note Intern Pager: [phone]  Patient name: Kathleen Jensen Medical record number: [id number] Date of birth: 07/18/1951 Age: 71 y.o. Gender: female  Primary Care Provider: Patient, No Pcp Per Consultants: Neurology Code Status: Full   Pt Overview and Major Events to Date:  10/18 - admitted   Assessment and Plan:   Kathleen Jensen is a 71 y.o. female who presented with AMS likely due to post-ictal state vs. Dilantin toxicity. Pertinent PMH/PSH includes seizure disorder, HTN, T2DM, hypothyroidism, HLD, CVA, and anemia.    * Altered mental status Much improved, appears to be at baseline now per nursing facility and family. EEG as below; seizure vs dilantin toxicity leading diagnosis. Dilantin resumed by neurology as below. - Continue seizure meds per neurology, appreciate recs - Vitals per routine  Seizures ([place]) Stable without further seizure activity in hospital. EEG again with potential epileptogenicity but without seizure. Neurology recommended discontinuing EEG today given no seizures as well as resuming dilantin at 175 mg BID with levels checked in 1-2 weeks. - Neurology following, appreciate recs  - Restart dilantin 175 mg BID with levels collected 1-2 weeks - Continue keppra and valproic acid, will determine PO doses per neurology  Sacral wound Difficult to visualize ulcer given pain. WOC consulted and provided recommendations. - Monitor wound care per nursing   FEN/GI: Tube feeds PPx: No anticoagulation given suspected microhemorrhages with cerebral amyloid per neurology, SCDs Dispo: Nursing facility, likely today  Subjective:  Appears happier today. Is able to make out some words, such as "good" when asked how she is feeling. She appears ready to leave the hospital.  Objective: Temp:  [98.2 F (36.8 C)-98.8 F (37.1 C)] 98.3 F (36.8 C) (10/21 1116) Pulse Rate:  [60-70] 60 (10/21 1116) Resp:  [16-19] 19 (10/21 1116) BP:  (136-168)/(71-97) 139/75 (10/21 1116) SpO2:  [98 %-100 %] 98 % (10/21 1116) Physical Exam: General: Alert, in NAD Skin: Warm, dry, and intact; skin appears macerated around PEG tube site with some erythema and pain with manipulation, though no drainage or bleeding noted HEENT: NCAT, EOM grossly normal, midline nasal septum Cardiac: RRR, no m/r/g appreciated Respiratory: CTAB anteriorly, breathing comfortably on RA Abdominal: Soft, TTP diffusely, nondistended, normoactive bowel sounds Neurological: Aphasic, moving her left arm passively Psychiatric: Appears frustrated with aphasia  Laboratory: Most recent CBC Lab Results  Component Value Date   WBC 8.4 05/17/2022   HGB 12.4 05/17/2022   HCT 37.9 05/17/2022   MCV 87.1 05/17/2022   PLT 178 05/17/2022   Most recent BMP    Latest Ref Rng & Units 05/17/2022    3:51 AM  BMP  Glucose 70 - 99 mg/dL 80   BUN 8 - 23 mg/dL 11   Creatinine 0.44 - 1.00 mg/dL 0.37   Sodium 135 - 145 mmol/L 134   Potassium 3.5 - 5.1 mmol/L 3.5   Chloride 98 - 111 mmol/L 103   CO2 22 - 32 mmol/L 22   Calcium 8.9 - 10.3 mg/dL 8.9    Mg 1.8  Imaging/Diagnostic Tests:  EEG IMPRESSION: This study showed evidence of potential epileptogenicity arising from left posterior quadrant. Additionally there is moderate diffuse encephalopathy, nonspecific etiology. No seizures were seen throughout the recording.  Ethelene Hal, MD 05/18/2022, 1:13 PM PGY-1, [place] Intern pager: [phone], text pages welcome Secure chat group [place]

## 2022-05-18 NOTE — TOC Transition Note (Signed)
Transition of Care [facility]) - CM/SW Discharge Note   Patient Details  Name: Kathleen Jensen MRN: [id number] Date of Birth: [birth year]  Transition of Care [facility]) CM/SW Contact:  Amador Cunas, [place] Phone Number: 05/18/2022, 3:12 PM   Clinical Narrative: Pt for dc back to [facility] where she is a LTC resident. Spoke to [facility] in admissions who confirmed they are prepared to admit pt to room 303B. RN has called report and PTAR arranged for transport. Pt's dtr Severiano Gilbert is aware of dc and reports agreeable. SW signing off at dc.   Wandra Feinstein, MSW, LCSW [phone] (coverage)        Final next level of care: Skilled Nursing Facility Barriers to Discharge: No Barriers Identified   Patient Goals and CMS Choice Patient states their goals for this hospitalization and ongoing recovery are:: patient unable to participate in goal setting, not oriented CMS Medicare.gov Compare Post Acute Care list provided to:: Patient Represenative (must comment) Choice offered to / list presented to : Adult Children  Discharge Placement              Patient chooses bed at: [facility] Patient to be transferred to facility by: [place] Name of family member notified: [name]/dtr/guardian Patient and family notified of of transfer: 05/18/22  Discharge Plan and Services     Post Acute Care Choice: [place]                               Social Determinants of Health (SDOH) Interventions Food Insecurity Interventions: Intervention Not Indicated Housing Interventions: Intervention Not Indicated Transportation Interventions: Intervention Not Indicated Utilities Interventions: Intervention Not Indicated   Readmission Risk Interventions    02/18/2022    2:23 PM  Readmission Risk Prevention Plan  Transportation Screening Complete  PCP or Specialist Appt within 5-7 Days Complete  Home Care Screening Complete  Medication Review (RN CM) Complete

## 2022-11-16 ENCOUNTER — Inpatient Hospital Stay (HOSPITAL_COMMUNITY): Payer: Medicare HMO

## 2022-11-16 ENCOUNTER — Emergency Department (HOSPITAL_COMMUNITY): Payer: Medicare HMO

## 2022-11-16 ENCOUNTER — Inpatient Hospital Stay (HOSPITAL_COMMUNITY)
Admission: EM | Admit: 2022-11-16 | Discharge: 2022-12-03 | DRG: 189 | Disposition: A | Discharge status: Skilled Nursing Facility | Payer: Medicare HMO | Source: Skilled Nursing Facility | Attending: Internal Medicine | Admitting: Internal Medicine

## 2022-11-16 ENCOUNTER — Encounter (HOSPITAL_COMMUNITY): Payer: Self-pay

## 2022-11-16 ENCOUNTER — Other Ambulatory Visit: Payer: Self-pay

## 2022-11-16 DIAGNOSIS — I10 Essential (primary) hypertension: Secondary | ICD-10-CM | POA: Diagnosis present

## 2022-11-16 DIAGNOSIS — Z9884 Bariatric surgery status: Secondary | ICD-10-CM

## 2022-11-16 DIAGNOSIS — R569 Unspecified convulsions: Secondary | ICD-10-CM

## 2022-11-16 DIAGNOSIS — Z515 Encounter for palliative care: Secondary | ICD-10-CM | POA: Diagnosis not present

## 2022-11-16 DIAGNOSIS — R140 Abdominal distension (gaseous): Secondary | ICD-10-CM | POA: Insufficient documentation

## 2022-11-16 DIAGNOSIS — R6889 Other general symptoms and signs: Secondary | ICD-10-CM

## 2022-11-16 DIAGNOSIS — E871 Hypo-osmolality and hyponatremia: Secondary | ICD-10-CM | POA: Diagnosis present

## 2022-11-16 DIAGNOSIS — K56600 Partial intestinal obstruction, unspecified as to cause: Secondary | ICD-10-CM | POA: Diagnosis present

## 2022-11-16 DIAGNOSIS — I69320 Aphasia following cerebral infarction: Secondary | ICD-10-CM

## 2022-11-16 DIAGNOSIS — N393 Stress incontinence (female) (male): Secondary | ICD-10-CM | POA: Diagnosis present

## 2022-11-16 DIAGNOSIS — E11649 Type 2 diabetes mellitus with hypoglycemia without coma: Secondary | ICD-10-CM | POA: Diagnosis not present

## 2022-11-16 DIAGNOSIS — J690 Pneumonitis due to inhalation of food and vomit: Secondary | ICD-10-CM | POA: Diagnosis present

## 2022-11-16 DIAGNOSIS — L89150 Pressure ulcer of sacral region, unstageable: Secondary | ICD-10-CM | POA: Diagnosis present

## 2022-11-16 DIAGNOSIS — Z882 Allergy status to sulfonamides status: Secondary | ICD-10-CM

## 2022-11-16 DIAGNOSIS — R0902 Hypoxemia: Secondary | ICD-10-CM

## 2022-11-16 DIAGNOSIS — K6289 Other specified diseases of anus and rectum: Secondary | ICD-10-CM | POA: Diagnosis not present

## 2022-11-16 DIAGNOSIS — G8191 Hemiplegia, unspecified affecting right dominant side: Secondary | ICD-10-CM

## 2022-11-16 DIAGNOSIS — R638 Other symptoms and signs concerning food and fluid intake: Secondary | ICD-10-CM | POA: Diagnosis not present

## 2022-11-16 DIAGNOSIS — E119 Type 2 diabetes mellitus without complications: Secondary | ICD-10-CM

## 2022-11-16 DIAGNOSIS — J9601 Acute respiratory failure with hypoxia: Principal | ICD-10-CM | POA: Diagnosis present

## 2022-11-16 DIAGNOSIS — Z87891 Personal history of nicotine dependence: Secondary | ICD-10-CM

## 2022-11-16 DIAGNOSIS — R54 Age-related physical debility: Secondary | ICD-10-CM | POA: Diagnosis present

## 2022-11-16 DIAGNOSIS — E1122 Type 2 diabetes mellitus with diabetic chronic kidney disease: Secondary | ICD-10-CM | POA: Diagnosis present

## 2022-11-16 DIAGNOSIS — K219 Gastro-esophageal reflux disease without esophagitis: Secondary | ICD-10-CM | POA: Diagnosis present

## 2022-11-16 DIAGNOSIS — Z66 Do not resuscitate: Secondary | ICD-10-CM | POA: Diagnosis present

## 2022-11-16 DIAGNOSIS — E876 Hypokalemia: Secondary | ICD-10-CM | POA: Diagnosis not present

## 2022-11-16 DIAGNOSIS — J9811 Atelectasis: Secondary | ICD-10-CM | POA: Diagnosis present

## 2022-11-16 DIAGNOSIS — E039 Hypothyroidism, unspecified: Secondary | ICD-10-CM | POA: Diagnosis present

## 2022-11-16 DIAGNOSIS — I69351 Hemiplegia and hemiparesis following cerebral infarction affecting right dominant side: Secondary | ICD-10-CM

## 2022-11-16 DIAGNOSIS — I69391 Dysphagia following cerebral infarction: Secondary | ICD-10-CM

## 2022-11-16 DIAGNOSIS — J9600 Acute respiratory failure, unspecified whether with hypoxia or hypercapnia: Secondary | ICD-10-CM | POA: Diagnosis present

## 2022-11-16 DIAGNOSIS — Z833 Family history of diabetes mellitus: Secondary | ICD-10-CM

## 2022-11-16 DIAGNOSIS — Z711 Person with feared health complaint in whom no diagnosis is made: Secondary | ICD-10-CM

## 2022-11-16 DIAGNOSIS — E1169 Type 2 diabetes mellitus with other specified complication: Secondary | ICD-10-CM | POA: Diagnosis present

## 2022-11-16 DIAGNOSIS — R4701 Aphasia: Secondary | ICD-10-CM | POA: Diagnosis present

## 2022-11-16 DIAGNOSIS — R401 Stupor: Secondary | ICD-10-CM | POA: Diagnosis not present

## 2022-11-16 DIAGNOSIS — K567 Ileus, unspecified: Secondary | ICD-10-CM | POA: Diagnosis not present

## 2022-11-16 DIAGNOSIS — E785 Hyperlipidemia, unspecified: Secondary | ICD-10-CM | POA: Diagnosis present

## 2022-11-16 DIAGNOSIS — Z885 Allergy status to narcotic agent status: Secondary | ICD-10-CM

## 2022-11-16 DIAGNOSIS — Z888 Allergy status to other drugs, medicaments and biological substances status: Secondary | ICD-10-CM

## 2022-11-16 DIAGNOSIS — J45909 Unspecified asthma, uncomplicated: Secondary | ICD-10-CM | POA: Diagnosis present

## 2022-11-16 DIAGNOSIS — Z7189 Other specified counseling: Secondary | ICD-10-CM | POA: Diagnosis not present

## 2022-11-16 DIAGNOSIS — Z1152 Encounter for screening for COVID-19: Secondary | ICD-10-CM

## 2022-11-16 DIAGNOSIS — Z86711 Personal history of pulmonary embolism: Secondary | ICD-10-CM

## 2022-11-16 DIAGNOSIS — G4700 Insomnia, unspecified: Secondary | ICD-10-CM | POA: Diagnosis present

## 2022-11-16 DIAGNOSIS — T17908A Unspecified foreign body in respiratory tract, part unspecified causing other injury, initial encounter: Principal | ICD-10-CM

## 2022-11-16 DIAGNOSIS — Z8673 Personal history of transient ischemic attack (TIA), and cerebral infarction without residual deficits: Secondary | ICD-10-CM | POA: Diagnosis not present

## 2022-11-16 DIAGNOSIS — Z7401 Bed confinement status: Secondary | ICD-10-CM

## 2022-11-16 DIAGNOSIS — N39498 Other specified urinary incontinence: Secondary | ICD-10-CM | POA: Diagnosis present

## 2022-11-16 DIAGNOSIS — Z931 Gastrostomy status: Secondary | ICD-10-CM | POA: Diagnosis not present

## 2022-11-16 DIAGNOSIS — Z79899 Other long term (current) drug therapy: Secondary | ICD-10-CM

## 2022-11-16 DIAGNOSIS — Z91048 Other nonmedicinal substance allergy status: Secondary | ICD-10-CM

## 2022-11-16 DIAGNOSIS — Z823 Family history of stroke: Secondary | ICD-10-CM

## 2022-11-16 DIAGNOSIS — G40909 Epilepsy, unspecified, not intractable, without status epilepticus: Secondary | ICD-10-CM | POA: Diagnosis present

## 2022-11-16 DIAGNOSIS — I480 Paroxysmal atrial fibrillation: Secondary | ICD-10-CM | POA: Diagnosis present

## 2022-11-16 DIAGNOSIS — E539 Vitamin B deficiency, unspecified: Secondary | ICD-10-CM | POA: Diagnosis present

## 2022-11-16 DIAGNOSIS — G4733 Obstructive sleep apnea (adult) (pediatric): Secondary | ICD-10-CM | POA: Diagnosis present

## 2022-11-16 DIAGNOSIS — R7889 Finding of other specified substances, not normally found in blood: Secondary | ICD-10-CM | POA: Diagnosis not present

## 2022-11-16 DIAGNOSIS — E8809 Other disorders of plasma-protein metabolism, not elsewhere classified: Secondary | ICD-10-CM | POA: Diagnosis present

## 2022-11-16 DIAGNOSIS — Z79890 Hormone replacement therapy (postmenopausal): Secondary | ICD-10-CM

## 2022-11-16 DIAGNOSIS — Z789 Other specified health status: Secondary | ICD-10-CM

## 2022-11-16 DIAGNOSIS — Z950 Presence of cardiac pacemaker: Secondary | ICD-10-CM

## 2022-11-16 DIAGNOSIS — Z7982 Long term (current) use of aspirin: Secondary | ICD-10-CM

## 2022-11-16 LAB — COMPREHENSIVE METABOLIC PANEL
ALT: 19 U/L (ref 0–44)
AST: 48 U/L — ABNORMAL HIGH (ref 15–41)
Albumin: 1.9 g/dL — ABNORMAL LOW (ref 3.5–5.0)
Alkaline Phosphatase: 134 U/L — ABNORMAL HIGH (ref 38–126)
Anion gap: 8 (ref 5–15)
BUN: 20 mg/dL (ref 8–23)
CO2: 31 mmol/L (ref 22–32)
Calcium: 8.1 mg/dL — ABNORMAL LOW (ref 8.9–10.3)
Chloride: 90 mmol/L — ABNORMAL LOW (ref 98–111)
Creatinine, Ser: 0.44 mg/dL (ref 0.44–1.00)
GFR, Estimated: >60 mL/min (ref >60)
Glucose, Bld: 75 mg/dL (ref 70–99)
Potassium: 4.6 mmol/L (ref 3.5–5.1)
Sodium: 129 mmol/L — ABNORMAL LOW (ref 135–145)
Total Bilirubin: 0.5 mg/dL (ref 0.3–1.2)
Total Protein: 8.1 g/dL (ref 6.5–8.1)

## 2022-11-16 LAB — CBC WITH DIFFERENTIAL/PLATELET
Abs Immature Granulocytes: 0.03 10*3/uL (ref 0.00–0.07)
Basophils Absolute: 0.1 10*3/uL (ref 0.0–0.1)
Basophils Relative: 1 %
Eosinophils Absolute: 0.1 10*3/uL (ref 0.0–0.5)
Eosinophils Relative: 1 %
HCT: 37.2 % (ref 36.0–46.0)
Hemoglobin: 12.5 g/dL (ref 12.0–15.0)
Immature Granulocytes: 0 %
Lymphocytes Relative: 33 %
Lymphs Abs: 3.4 10*3/uL (ref 0.7–4.0)
MCH: 31.1 pg (ref 26.0–34.0)
MCHC: 33.6 g/dL (ref 30.0–36.0)
MCV: 92.5 fL (ref 80.0–100.0)
Monocytes Absolute: 1.1 10*3/uL — ABNORMAL HIGH (ref 0.1–1.0)
Monocytes Relative: 11 %
Neutro Abs: 5.6 10*3/uL (ref 1.7–7.7)
Neutrophils Relative %: 54 %
Platelets: 202 10*3/uL (ref 150–400)
RBC: 4.02 MIL/uL (ref 3.87–5.11)
RDW: 15 % (ref 11.5–15.5)
WBC: 10.3 10*3/uL (ref 4.0–10.5)
nRBC: 0 % (ref 0.0–0.2)

## 2022-11-16 LAB — I-STAT ARTERIAL BLOOD GAS, ED
Acid-Base Excess: 9 mmol/L — ABNORMAL HIGH (ref 0.0–2.0)
Bicarbonate: 36.1 mmol/L — ABNORMAL HIGH (ref 20.0–28.0)
Calcium, Ion: 1.16 mmol/L (ref 1.15–1.40)
HCT: 39 % (ref 36.0–46.0)
Hemoglobin: 13.3 g/dL (ref 12.0–15.0)
O2 Saturation: 100 %
Patient temperature: 98.5
Potassium: 4.7 mmol/L (ref 3.5–5.1)
Sodium: 131 mmol/L — ABNORMAL LOW (ref 135–145)
TCO2: 38 mmol/L — ABNORMAL HIGH (ref 22–32)
pCO2 arterial: 56.4 mmHg — ABNORMAL HIGH (ref 32–48)
pH, Arterial: 7.414 (ref 7.35–7.45)
pO2, Arterial: 335 mmHg — ABNORMAL HIGH (ref 83–108)

## 2022-11-16 LAB — URINALYSIS, ROUTINE W REFLEX MICROSCOPIC
Bilirubin Urine: NEGATIVE
Glucose, UA: 150 mg/dL — AB
Ketones, ur: NEGATIVE mg/dL
Nitrite: NEGATIVE
Protein, ur: NEGATIVE mg/dL
RBC / HPF: >50 RBC/hpf (ref 0–5)
Specific Gravity, Urine: 1.025 (ref 1.005–1.030)
WBC, UA: >50 WBC/hpf (ref 0–5)
pH: 5 (ref 5.0–8.0)

## 2022-11-16 LAB — VALPROIC ACID LEVEL: Valproic Acid Lvl: 53 ug/mL (ref 50.0–100.0)

## 2022-11-16 LAB — RAPID URINE DRUG SCREEN, HOSP PERFORMED
Amphetamines: NOT DETECTED
Barbiturates: NOT DETECTED
Benzodiazepines: NOT DETECTED
Cocaine: NOT DETECTED
Opiates: NOT DETECTED
Tetrahydrocannabinol: NOT DETECTED

## 2022-11-16 LAB — RESP PANEL BY RT-PCR (RSV, FLU A&B, COVID)  RVPGX2
Influenza A by PCR: NEGATIVE
Influenza B by PCR: NEGATIVE
Resp Syncytial Virus by PCR: NEGATIVE
SARS Coronavirus 2 by RT PCR: NEGATIVE

## 2022-11-16 LAB — CBG MONITORING, ED
Glucose-Capillary: 53 mg/dL — ABNORMAL LOW (ref 70–99)
Glucose-Capillary: 111 mg/dL — ABNORMAL HIGH (ref 70–99)
Glucose-Capillary: 68 mg/dL — ABNORMAL LOW (ref 70–99)

## 2022-11-16 LAB — AMMONIA: Ammonia: 43 umol/L — ABNORMAL HIGH (ref 9–35)

## 2022-11-16 LAB — ETHANOL: Alcohol, Ethyl (B): <10 mg/dL (ref <10)

## 2022-11-16 MED ORDER — PHENYTOIN 125 MG/5ML PO SUSP
175.0000 mg | Freq: Two times a day (BID) | ORAL | Status: DC
  Administered 2022-11-17 – 2022-11-22 (×12): 175 mg
  Filled 2022-11-16 (×14): qty 8

## 2022-11-16 MED ORDER — VALPROIC ACID 250 MG/5ML PO SOLN
500.0000 mg | Freq: Four times a day (QID) | ORAL | Status: DC
  Administered 2022-11-17 – 2022-11-22 (×21): 500 mg
  Filled 2022-11-16 (×23): qty 10

## 2022-11-16 MED ORDER — METOPROLOL TARTRATE 12.5 MG HALF TABLET
12.5000 mg | ORAL_TABLET | Freq: Two times a day (BID) | ORAL | Status: DC
  Administered 2022-11-17 – 2022-11-20 (×9): 12.5 mg
  Filled 2022-11-16 (×9): qty 1

## 2022-11-16 MED ORDER — SODIUM CHLORIDE 0.9 % IV SOLN
3.0000 g | Freq: Once | INTRAVENOUS | Status: AC
  Administered 2022-11-16: 3 g via INTRAVENOUS
  Filled 2022-11-16: qty 8

## 2022-11-16 MED ORDER — ASPIRIN 81 MG PO CHEW
81.0000 mg | CHEWABLE_TABLET | Freq: Every day | ORAL | Status: DC
  Administered 2022-11-17 – 2022-12-02 (×17): 81 mg
  Filled 2022-11-16 (×17): qty 1

## 2022-11-16 MED ORDER — AMIODARONE HCL 200 MG PO TABS
200.0000 mg | ORAL_TABLET | Freq: Every day | ORAL | Status: DC
  Administered 2022-11-17 – 2022-12-03 (×17): 200 mg
  Filled 2022-11-16 (×17): qty 1

## 2022-11-16 MED ORDER — DEXTROSE 50 % IV SOLN
1.0000 | Freq: Once | INTRAVENOUS | Status: AC
  Administered 2022-11-16: 50 mL via INTRAVENOUS
  Filled 2022-11-16: qty 50

## 2022-11-16 MED ORDER — ACETAMINOPHEN 160 MG/5ML PO SOLN
640.0000 mg | Freq: Three times a day (TID) | ORAL | Status: DC
  Administered 2022-11-17 – 2022-12-03 (×48): 640 mg
  Filled 2022-11-16 (×49): qty 20.3

## 2022-11-16 MED ORDER — DEXTROSE-NACL 5-0.9 % IV SOLN: INTRAVENOUS | Status: DC

## 2022-11-16 MED ORDER — HYDROCODONE-ACETAMINOPHEN 5-325 MG PO TABS
1.0000 | ORAL_TABLET | Freq: Every day | ORAL | Status: DC | PRN
  Administered 2022-11-24 – 2022-12-01 (×3): 1
  Filled 2022-11-16 (×4): qty 1

## 2022-11-16 MED ORDER — METOPROLOL TARTRATE 5 MG/5ML IV SOLN: 5.0000 mg | Freq: Four times a day (QID) | INTRAVENOUS | Status: DC | PRN

## 2022-11-16 MED ORDER — NALOXONE HCL 0.4 MG/ML IJ SOLN
0.4000 mg | Freq: Once | INTRAMUSCULAR | Status: DC
  Filled 2022-11-16: qty 1

## 2022-11-16 NOTE — Assessment & Plan Note (Signed)
Check TSH Has been off her meds recently

## 2022-11-16 NOTE — ED Provider Notes (Signed)
[facility] AT [facility] Provider Note   CSN: [id number] Arrival date & time: 11/16/22  1230     History  Chief Complaint  Patient presents with   Aspiration    Kathleen Jensen is a 72 y.o. female.  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  Patient unable to give history at this time due to her mental status.  Unable to reach family and her facility at this time [facility]).  Per EMS patient was at her baseline last night and this morning was found to have gurgling respirations with hypoxia.  Also with foul-smelling urine recently.  They report that patient is aphasic at baseline but are unsure of her mental status.  Was placed on 12 L of oxygen by EMS.  Was hospitalized several months ago with altered mental status and apparently is typically alert but aphasic and weak at baseline.  Also spontaneously moved her left upper extremity and wrist through her right arm to pain with minimal lower extremity movement bilaterally.  Patient was not following commands at that time.       Home Medications Prior to Admission medications   Medication Sig Start Date End Date Taking? Authorizing Provider  acetaminophen (TYLENOL) 160 MG/5ML liquid Place 960 mg into feeding tube every 8 (eight) hours.   Yes [provider]  Acidophilus Lactobacillus CAPS Give 1 capsule by tube daily.   Yes [provider]  amiodarone (PACERONE) 200 MG tablet 200 mg BID for 10 days then 200mg  daily for 21 days. Follow up with cardiology for further dosing Patient taking differently: Place 200 mg into feeding tube daily. 02/22/22  Yes Glade Lloyd, MD  ascorbic acid (VITAMIN C) 500 MG  tablet Place 1 tablet (500 mg total) into feeding tube 2 (two) times daily. Patient taking differently: Place 500 mg into feeding tube daily. 02/22/22  Yes Glade Lloyd, MD  aspirin EC 81 MG tablet 81 mg at bedtime. Via tube   Yes [provider]  Cholecalciferol 25 MCG (1000 UT) capsule Place 1,000 Units into feeding tube at bedtime. 03/12/22  Yes [provider]  famotidine (PEPCID) 40 MG/5ML suspension Place 40 mg into feeding tube daily. 03/25/22  Yes [provider]  folic acid (FOLVITE) 800 MCG tablet Place 800 mcg into feeding tube daily.   Yes [provider]  HYDROcodone-acetaminophen (NORCO/VICODIN) 5-325 MG tablet Take 1 tablet by mouth daily as needed for moderate pain.   Yes [provider]  Menthol, Topical Analgesic, (BIOFREEZE) 4 % GEL Apply 2 g topically 2 (two) times daily. Upper left arm   Yes [provider]  Nutritional Supplements (FEEDING SUPPLEMENT, JEVITY 1.2 CAL,) LIQD Place 1,000 mLs into feeding tube continuous. At 49ml/hr 02/22/22  Yes Glade Lloyd, MD  Nutritional Supplements (FEEDING SUPPLEMENT, PROSOURCE TF,) liquid Place 45 mLs into feeding tube 3 (three) times daily. 02/22/22  Yes Glade Lloyd, MD  phenytoin (DILANTIN) 125 MG/5ML suspension Place 7 mLs (175 mg total) into feeding tube 2 (two) times daily. 05/18/22  Yes Mabe, Earvin Hansen, MD  polyethylene glycol (MIRALAX / GLYCOLAX) 17 g packet Place 34 g into feeding tube daily as needed for mild constipation. 02/22/22  Yes Glade Lloyd, MD  senna-docusate (SENOKOT-S) 8.6-50 MG tablet Place 2  tablets into feeding tube 2 (two) times daily. 02/22/22  Yes Glade Lloyd, MD  Simethicone 80 MG TABS 1 tablet (80 mg total) by PEG Tube route in the morning and at bedtime. Patient taking differently: Give 80 mg by tube in the morning and at bedtime. Simethicone  chewable tablet 02/22/22  Yes Glade Lloyd, MD  simvastatin (ZOCOR) 10 MG tablet Place 1 tablet (10 mg total)  into feeding tube at bedtime. 02/22/22  Yes Glade Lloyd, MD  valproic acid (DEPAKENE) 250 MG/5ML solution Place 10 mLs (500 mg total) into feeding tube 4 (four) times daily. 02/22/22  Yes Glade Lloyd, MD  Water For Irrigation, Sterile (FREE WATER) SOLN Place 100 mLs into feeding tube every 4 (four) hours. 02/22/22  Yes Glade Lloyd, MD  Zinc 50 MG TABS Give 50 mg by tube daily.   Yes [provider]  hydrOXYzine (ATARAX) 10 MG/5ML syrup Place 10 mg into feeding tube 3 (three) times daily. Patient not taking: Reported on 11/16/2022 03/22/22   [provider]  levETIRAcetam (KEPPRA) 100 MG/ML solution Place 1,000 mg into feeding tube 2 (two) times daily. Patient not taking: Reported on 11/16/2022    [provider]  levothyroxine (SYNTHROID) 75 MCG tablet Place 1 tablet (75 mcg total) into feeding tube daily before breakfast. Patient not taking: Reported on 11/16/2022 02/22/22   Glade Lloyd, MD  metoprolol tartrate (LOPRESSOR) 25 MG tablet Place 12.5 mg into feeding tube 2 (two) times daily. Patient not taking: Reported on 11/16/2022    [provider]  pregabalin (LYRICA) 75 MG capsule Take 75 mg by mouth 3 (three) times daily. Patient not taking: Reported on 11/16/2022    [provider]      Allergies    Tape, Demerol [meperidine hcl], Sulfonamide derivatives, Bacitracin-polymyxin b, and Oxycontin [oxycodone]    Review of Systems   Review of Systems  Physical Exam Updated Vital Signs BP 99/70   Pulse 60   Temp 98.7 F (37.1 C) (Axillary)   Resp 20   SpO2 100%  Physical Exam Vitals and nursing note reviewed.  Constitutional:      General: She is not in acute distress.    Appearance: She is well-developed.     Comments: Lethargic appearing  HENT:     Head: Normocephalic and atraumatic.     Right Ear: External ear normal.     Left Ear: External ear normal.     Nose: Nose normal.  Eyes:     Extraocular Movements: Extraocular  movements intact.     Conjunctiva/sclera: Conjunctivae normal.     Pupils: Pupils are equal, round, and reactive to light.     Comments: Pupils 6 mm bilaterally  Cardiovascular:     Rate and Rhythm: Normal rate and regular rhythm.     Heart sounds: No murmur heard.    Comments: Implanted pacemaker in left upper Pulmonary:     Effort: No respiratory distress.     Breath sounds: Normal breath sounds.     Comments: Poor respiratory effort.  Unable to auscultate lungs due to habitus and fact the patient is unable to follow commands but no apparent wheezing or rales noted.  On nonrebreather satting 99%. Abdominal:     General: Abdomen is flat. There is distension.     Palpations: Abdomen is soft. There is no mass.     Tenderness: There is no abdominal tenderness. There is no guarding.  Genitourinary:    Comments: Foley catheter in place with cloudy urine  Musculoskeletal:     Cervical back: Normal range of motion and neck supple.     Right lower leg: Edema present.     Left lower leg: Edema present.  Skin:    General: Skin is warm and dry.  Neurological:     Mental Status: She is disoriented.     Comments: Patient does not follow commands.  Withdrawals left greater than right lower extremities to pain.  Not withdrawing upper extremities or moving upper extremities at all.  Psychiatric:        Mood and Affect: Mood normal.     ED Results / Procedures / Treatments   Labs (all labs ordered are listed, but only abnormal results are displayed) Labs Reviewed  AMMONIA - Abnormal; Notable for the following components:      Result Value   Ammonia 43 (*)    All other components within normal limits  COMPREHENSIVE METABOLIC PANEL - Abnormal; Notable for the following components:   Sodium 129 (*)    Chloride 90 (*)    Calcium 8.1 (*)    Albumin 1.9 (*)    AST 48 (*)    Alkaline Phosphatase 134 (*)    All other components within normal limits  CBC WITH DIFFERENTIAL/PLATELET - Abnormal;  Notable for the following components:   Monocytes Absolute 1.1 (*)    All other components within normal limits  CBG MONITORING, ED - Abnormal; Notable for the following components:   Glucose-Capillary 53 (*)    All other components within normal limits  I-STAT ARTERIAL BLOOD GAS, ED - Abnormal; Notable for the following components:   pCO2 arterial 56.4 (*)    pO2, Arterial 335 (*)    Bicarbonate 36.1 (*)    TCO2 38 (*)    Acid-Base Excess 9.0 (*)    Sodium 131 (*)    All other components within normal limits  CBG MONITORING, ED - Abnormal; Notable for the following components:   Glucose-Capillary 111 (*)    All other components within normal limits  CBG MONITORING, ED - Abnormal; Notable for the following components:   Glucose-Capillary 68 (*)    All other components within normal limits  RESP PANEL BY RT-PCR (RSV, FLU A&B, COVID)  [id number]  ETHANOL  VALPROIC ACID LEVEL  PHENYTOIN LEVEL, FREE AND TOTAL  RAPID URINE DRUG SCREEN, HOSP PERFORMED  URINALYSIS, ROUTINE W REFLEX MICROSCOPIC    EKG EKG Interpretation  Date/Time:  Saturday November 16 2022 12:38:21 EDT Ventricular Rate:  61 PR Interval:    QRS Duration: 86 QT Interval:  407 QTC Calculation: 410 R Axis:   -30 Text Interpretation: Atrially paced rhythm Abnormal R-wave progression, early transition LVH with secondary repolarization abnormality Anterior Q waves, possibly due to LVH Confirmed by Vonita Moss [phone]) on 11/16/2022 1:05:17 PM  Radiology CT HEAD WO CONTRAST  Result Date: 11/16/2022 CLINICAL DATA:  Mental status change, unknown cause EXAM: CT HEAD WITHOUT CONTRAST TECHNIQUE: Contiguous axial images were obtained from the base of the skull through the vertex without intravenous contrast. RADIATION DOSE REDUCTION: This exam was performed according to the departmental dose-optimization program which includes automated exposure control, adjustment of the mA and/or kV according to patient size and/or use of  iterative reconstruction technique. COMPARISON:  05/15/2022 FINDINGS: Brain: No evidence of acute infarction, hemorrhage, hydrocephalus, extra-axial collection or mass lesion/mass effect. Remote left parietal lobe infarct. Patchy low-density changes within the periventricular and subcortical white matter most compatible with chronic microvascular ischemic change. Moderate diffuse cerebral volume loss.  Vascular: Atherosclerotic calcifications involving the large vessels of the skull base. No unexpected hyperdense vessel. Skull: Normal. Negative for fracture or focal lesion. Sinuses/Orbits: No acute finding. Other: None. IMPRESSION: 1. No acute intracranial abnormality. 2. Chronic microvascular ischemic change and cerebral volume loss. Remote left parietal lobe infarct. Electronically Signed   By: Duanne Guess D.O.   On: 11/16/2022 15:14   DG Chest Port 1 View  Result Date: 11/16/2022 CLINICAL DATA:  Aspiration.  Hypoxia. EXAM: PORTABLE CHEST 1 VIEW COMPARISON:  05/15/2022 FINDINGS: Heart size remains within normal limits. Permanent pacemaker remains in appropriate position. Aortic atherosclerotic calcification incidentally noted. Low lung volumes are again seen bilaterally, with mild bibasilar atelectasis. Lungs are otherwise clear. No evidence of pleural effusion. IMPRESSION: Low lung volumes and mild bibasilar atelectasis. Electronically Signed   By: Danae Orleans M.D.   On: 11/16/2022 13:39    Procedures Procedures   Medications Ordered in ED Medications  dextrose 5 %-0.9 % sodium chloride infusion ( Intravenous New Bag/Given 11/16/22 2106)  dextrose 50 % solution 50 mL (50 mLs Intravenous Given 11/16/22 1525)  Ampicillin-Sulbactam (UNASYN) 3 g in sodium chloride 0.9 % 100 mL IVPB (3 g Intravenous New Bag/Given 11/16/22 2107)  dextrose 50 % solution 50 mL (50 mLs Intravenous Given 11/16/22 2107)    ED Course/ Medical Decision Making/ A&P Clinical Course as of 11/16/22 2148  Sat Nov 16, 2022   1317 Dr Linna Darner from palliative consulted. Will have NP at [facility] see the patient.  [RP]  1324 Spoke with Daughter Ms Susann Givens. Verifies full code. Per Daughter Ms Susann Givens, Pt has had some congestion. Has asthma. Does have an inhaler. Has been aphasic since July of last year. Was septic. Weak on R side. Strength has been worsening recently. Tries to hold conversations sometime. Typically will say mhm or how are you doing and she will then go to sleep. Has been more lethargic over the past few weeks.  [RP]  1339 Per granddaughter was not responding to much 2 weeks ago. She will come to the ED around 3pm.  [RP]  1551 Amber smith from palliative coming down to assess the patient shortly.  [RP]  1744 Amber Katrinka Blazing has had a discussion with the patient's daughters.  They would like for her to remain on nonrebreather at this time and 2 of them would like to make her comfort care but will come to the bedside to make a decision.  They should all be here at 8 PM.  They state if she needs intubation for respiratory arrest to intubate her. [RP]  2044 Parke Poisson, patient's daughter, is at the bedside and after long discussion has decided to make her mother DNR/DNI at this time. [RP]  2100 Dr Shawnie Pons from hospitalist will admit for aspiration pneumonia and altered mental status. [RP]    Clinical Course User Index [RP] Rondel Baton, MD                            Medical Decision Making Amount and/or Complexity of Data Reviewed Labs: ordered. Radiology: ordered.  Risk Prescription drug management. Decision regarding hospitalization.   Kathleen Jensen is a 72 y.o. 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  Will also check for pneumonia and URI as well.  Likely is chronic given her G-tube but limited additional history available at this time.  Unclear what her mental baseline is but suspect based on neurology notes that she is not far from her usual mental state.  However, given the limited additional history will obtain generalized altered mental status workup at this time.  Did have a prior hospitalization where he was thought that her antiepileptics may be causing her altered mental status will also check levels.  Not appearing to have any convulsions or similar activities feel that seizure is less likely.  Plan:  Labs CBG VBG Valproic acid Ammonia Ethanol RVP Chest x-ray Head CT  ED Summary/Re-evaluation:  Patient was able to be weaned transiently to 3 L nasal cannula then and had to be placed back on nonrebreather.  Was found to be hypoglycemic and was given D50 twice.  Suspect this is likely due to her not getting her tube feeds recently.  Had only mild improvement of her mental status with the D50.  Chest x-ray without readily apparent pneumonia but was given Unasyn since it may be too early for an aspiration pneumonia to show up on her chest x-ray.  Had extensive goals of care conversations with the patient's family and consulted palliative care.  They initially wanted her to be full code but at this time have requested that she be DNR/DNI but continued to receive treatment and not yet be comfort care.  I feel this is reasonable given the patient's long-term prognosis and the fact that if she were intubated she would likely be on a ventilator for a prolonged period of time.  Discussed with medicine who  will admit the patient.  After the family came to the bedside appears that she is near her new mental status baseline.  This patient presents to the ED for concern of complaints listed in HPI, this involves an extensive number of treatment options, and is a complaint that carries with it a high risk of complications and morbidity. Disposition including potential need for admission considered.   Dispo: Admit  Additional history obtained from family Records reviewed Outpatient Clinic Notes The following labs were independently interpreted: Chemistry and show  hyponatremia I independently reviewed the following imaging with scope of interpretation limited to determining acute life threatening conditions related to emergency care: Chest x-ray and agree with the radiologist interpretation with the following exceptions: none I personally reviewed and interpreted cardiac monitoring: normal sinus rhythm  I personally reviewed and interpreted the pt's EKG: see above for interpretation  I have reviewed the patients home medications and made adjustments as needed Consults: Hospitalist and palliative care Social Determinants of health:  Elderly   Final Clinical Impression(s) / ED Diagnoses Final diagnoses:  Aspiration into airway, initial encounter  Hypoxia  History of stroke  Stupor    Rx / DC Orders ED Discharge Orders     None      CRITICAL CARE Performed by: Rondel Baton   Total critical care time: 90 minutes  Critical care time was exclusive of separately billable procedures and treating other patients.  Critical care was necessary to treat or prevent imminent or life-threatening deterioration.  Critical care was time spent personally by me on the following activities:  development of treatment plan with patient and/or surrogate as well as nursing, discussions with consultants, evaluation of patient's response to treatment, examination of patient, obtaining history from  patient or surrogate, ordering and performing treatments and interventions, ordering and review of laboratory studies, ordering and review of radiographic studies, pulse oximetry and re-evaluation of patient's condition.    Rondel Baton, MD 11/16/22 [phone]

## 2022-11-16 NOTE — H&P (Signed)
History and Physical    Patient: Kathleen Jensen DOB: 05/09/1951 DOA: 11/16/2022 DOS: the patient was seen and examined on 11/16/2022 PCP: Patient, No Pcp Per  Patient coming from: SNF  Chief Complaint:  Chief Complaint  Patient presents with   Aspiration   HPI: Kathleen Jensen is a 72 y.o. 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  She has a history of A-fib, pacemaker in place, seizures, HTN, DM, hypothyroidism, anemia. She has a PEG tube in place for approximately the last year due to poor p.o. intake.  She has had a stepwise decline over the last year following sepsis from UTI and other infectious processes.  Patient has not indwelling Foley as well.  Patient resides at [facility] usually and has been unable to talk for the last 4 months.  She presents to the ED with possible aspiration.  Patient was noted to have gurgling on respirations that came in place this morning and foul-smelling urine.  EMS brought her to the hospital she required nonrebreather.  She was then weaned to 3 L via nasal cannula and then aspirated some more while in the ED despite not having any thing by mouth or any tube feeds going and advance again back to nonrebreather to support her respirations.  In the ED palliative care came and saw the patient had a long lengthy conversation with the family.  Given her worsening course the family decided to make her DNR/DNI but still wanted aggressive medical treatment.  Review of Systems: unable to review all systems due to the inability of the patient to answer questions. 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 gastric bypass   GERD (gastroesophageal  reflux disease)    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    Hyperlipemia    Hypertension    Insomnia    Intracerebral hemorrhage, intraventricular    Muscle weakness    Other abnormalities of gait and mobility    Seizures    Stroke    Unsteadiness on feet    Vitamin B deficiency    Past Surgical History:  Procedure Laterality Date   BACK SURGERY     Dec 18, 2016 Dr.Torrealba   BREAST BIOPSY Right    BREAST EXCISIONAL BIOPSY     CERVICAL SPINE SURGERY     C4   CESAREAN SECTION     GASTRIC BYPASS  03/2007   TONSILLECTOMY     Social History:  reports that she quit smoking about 43 years ago. She has never used smokeless tobacco. She reports that she does not drink alcohol and does not use drugs.  Allergies  Allergen Reactions   Tape Rash   Demerol [Meperidine Hcl] Nausea And Vomiting   Sulfonamide Derivatives Itching   Bacitracin-Polymyxin B Dermatitis   Oxycontin [Oxycodone] Itching    Family History  Problem Relation Age of Onset   High blood pressure Mother    Diabetes Mother    Diabetes Father    Stroke Father    Cancer Sister        Colorectal cancer   Cancer Brother  colon    Prior to Admission medications   Medication Sig Start Date End Date Taking? Authorizing Provider  acetaminophen (TYLENOL) 160 MG/5ML liquid Place 960 mg into feeding tube every 8 (eight) hours.   Yes [provider]  Acidophilus Lactobacillus CAPS Give 1 capsule by tube daily.   Yes [provider]  amiodarone (PACERONE) 200 MG tablet 200 mg BID for 10 days then 200mg  daily for 21 days. Follow up with cardiology for further dosing Patient taking differently: Place 200 mg into feeding tube daily. 02/22/22  Yes Glade Lloyd, MD  ascorbic acid (VITAMIN C) 500 MG tablet Place 1 tablet (500 mg total) into feeding tube 2 (two) times daily. Patient taking differently: Place 500 mg into feeding tube daily.  02/22/22  Yes Glade Lloyd, MD  aspirin EC 81 MG tablet 81 mg at bedtime. Via tube   Yes [provider]  Cholecalciferol 25 MCG (1000 UT) capsule Place 1,000 Units into feeding tube at bedtime. 03/12/22  Yes [provider]  famotidine (PEPCID) 40 MG/5ML suspension Place 40 mg into feeding tube daily. 03/25/22  Yes [provider]  folic acid (FOLVITE) 800 MCG tablet Place 800 mcg into feeding tube daily.   Yes [provider]  HYDROcodone-acetaminophen (NORCO/VICODIN) 5-325 MG tablet Take 1 tablet by mouth daily as needed for moderate pain.   Yes [provider]  Menthol, Topical Analgesic, (BIOFREEZE) 4 % GEL Apply 2 g topically 2 (two) times daily. Upper left arm   Yes [provider]  Nutritional Supplements (FEEDING SUPPLEMENT, JEVITY 1.2 CAL,) LIQD Place 1,000 mLs into feeding tube continuous. At 26ml/hr 02/22/22  Yes Glade Lloyd, MD  Nutritional Supplements (FEEDING SUPPLEMENT, PROSOURCE TF,) liquid Place 45 mLs into feeding tube 3 (three) times daily. 02/22/22  Yes Glade Lloyd, MD  phenytoin (DILANTIN) 125 MG/5ML suspension Place 7 mLs (175 mg total) into feeding tube 2 (two) times daily. 05/18/22  Yes Mabe, Earvin Hansen, MD  polyethylene glycol (MIRALAX / GLYCOLAX) 17 g packet Place 34 g into feeding tube daily as needed for mild constipation. 02/22/22  Yes Glade Lloyd, MD  senna-docusate (SENOKOT-S) 8.6-50 MG tablet Place 2 tablets into feeding tube 2 (two) times daily. 02/22/22  Yes Glade Lloyd, MD  Simethicone 80 MG TABS 1 tablet (80 mg total) by PEG Tube route in the morning and at bedtime. Patient taking differently: Give 80 mg by tube in the morning and at bedtime. Simethicone 80mg  chewable tablet 02/22/22  Yes Glade Lloyd, MD  simvastatin (ZOCOR) 10 MG tablet Place 1 tablet (10 mg total) into feeding tube at bedtime. 02/22/22  Yes Glade Lloyd, MD  valproic acid (DEPAKENE) 250 MG/5ML solution Place 10 mLs (500 mg total) into  feeding tube 4 (four) times daily. 02/22/22  Yes Glade Lloyd, MD  Water For Irrigation, Sterile (FREE WATER) SOLN Place 100 mLs into feeding tube every 4 (four) hours. 02/22/22  Yes Glade Lloyd, MD  Zinc 50 MG TABS Give 50 mg by tube daily.   Yes [provider]  hydrOXYzine (ATARAX) 10 MG/5ML syrup Place 10 mg into feeding tube 3 (three) times daily. Patient not taking: Reported on 11/16/2022 03/22/22   [provider]  levETIRAcetam (KEPPRA) 100 MG/ML solution Place 1,000 mg into feeding tube 2 (two) times daily. Patient not taking: Reported on 11/16/2022    [provider]  levothyroxine (SYNTHROID) 75 MCG tablet Place 1 tablet (75 mcg total) into feeding tube daily before breakfast. Patient not taking: Reported on 11/16/2022 02/22/22  Glade Lloyd, MD  metoprolol tartrate (LOPRESSOR) 25 MG tablet Place 12.5 mg into feeding tube 2 (two) times daily. Patient not taking: Reported on 11/16/2022    [provider]  pregabalin (LYRICA) 75 MG capsule Take 75 mg by mouth 3 (three) times daily. Patient not taking: Reported on 11/16/2022    [provider]    Physical Exam: Vitals:   11/16/22 1707 11/16/22 1800 11/16/22 1900 11/16/22 2000  BP:  123/82 (!) 170/97 (!) 138/99  Pulse:  (!) 59 60 (!) 59  Resp:  20 20 20   Temp: 98.7 F (37.1 C)     TempSrc: Axillary     SpO2:  (!) 85% 95% 98%   Physical Examination: General appearance - ill-appearing Chest - rhonchi noted diffusely Heart -distant heart sounds Abdomen -taut, distended Neurological - abnormal findings: Currently unresponsive Extremities - pedal edema 3+  Data Reviewed: Results for orders placed or performed during the hospital encounter of 11/16/22 (from the past 24 hour(s))  Ammonia     Status: Abnormal   Collection Time: 11/16/22  2:11 PM  Result Value Ref Range   Ammonia 43 (H) 9 - 35 umol/L  Comprehensive metabolic panel     Status: Abnormal   Collection Time: 11/16/22  2:11  PM  Result Value Ref Range   Sodium 129 (L) 135 - 145 mmol/L   Potassium 4.6 3.5 - 5.1 mmol/L   Chloride 90 (L) 98 - 111 mmol/L   CO2 31 22 - 32 mmol/L   Glucose, Bld 75 70 - 99 mg/dL   BUN 20 8 - 23 mg/dL   Creatinine, Ser 1.61 0.44 - 1.00 mg/dL   Calcium 8.1 (L) 8.9 - 10.3 mg/dL   Total Protein 8.1 6.5 - 8.1 g/dL   Albumin 1.9 (L) 3.5 - 5.0 g/dL   AST 48 (H) 15 - 41 U/L   ALT 19 0 - 44 U/L   Alkaline Phosphatase 134 (H) 38 - 126 U/L   Total Bilirubin 0.5 0.3 - 1.2 mg/dL   GFR, Estimated >09 >60 mL/min   Anion gap 8 5 - 15  Ethanol     Status: None   Collection Time: 11/16/22  2:11 PM  Result Value Ref Range   Alcohol, Ethyl (B) <10 <10 mg/dL  CBC with Differential/Platelet     Status: Abnormal   Collection Time: 11/16/22  2:11 PM  Result Value Ref Range   WBC 10.3 4.0 - 10.5 K/uL   RBC 4.02 3.87 - 5.11 MIL/uL   Hemoglobin 12.5 12.0 - 15.0 g/dL   HCT 45.4 09.8 - 11.9 %   MCV 92.5 80.0 - 100.0 fL   MCH 31.1 26.0 - 34.0 pg   MCHC 33.6 30.0 - 36.0 g/dL   RDW 14.7 82.9 - 56.2 %   Platelets 202 150 - 400 K/uL   nRBC 0.0 0.0 - 0.2 %   Neutrophils Relative % 54 %   Neutro Abs 5.6 1.7 - 7.7 K/uL   Lymphocytes Relative 33 %   Lymphs Abs 3.4 0.7 - 4.0 K/uL   Monocytes Relative 11 %   Monocytes Absolute 1.1 (H) 0.1 - 1.0 K/uL   Eosinophils Relative 1 %   Eosinophils Absolute 0.1 0.0 - 0.5 K/uL   Basophils Relative 1 %   Basophils Absolute 0.1 0.0 - 0.1 K/uL   Immature Granulocytes 0 %   Abs Immature Granulocytes 0.03 0.00 - 0.07 K/uL  Valproic acid level     Status: None   Collection  Time: 11/16/22  2:11 PM  Result Value Ref Range   Valproic Acid Lvl 53 50.0 - 100.0 ug/mL  Resp panel by RT-PCR (RSV, Flu A&B, Covid) Anterior Nasal Swab     Status: None   Collection Time: 11/16/22  2:11 PM   Specimen: Anterior Nasal Swab  Result Value Ref Range   SARS Coronavirus 2 by RT PCR NEGATIVE NEGATIVE   Influenza A by PCR NEGATIVE NEGATIVE   Influenza B by PCR NEGATIVE NEGATIVE    Resp Syncytial Virus by PCR NEGATIVE NEGATIVE  I-Stat arterial blood gas, ED     Status: Abnormal   Collection Time: 11/16/22  2:18 PM  Result Value Ref Range   pH, Arterial 7.414 7.35 - 7.45   pCO2 arterial 56.4 (H) 32 - 48 mmHg   pO2, Arterial 335 (H) 83 - 108 mmHg   Bicarbonate 36.1 (H) 20.0 - 28.0 mmol/L   TCO2 38 (H) 22 - 32 mmol/L   O2 Saturation 100 %   Acid-Base Excess 9.0 (H) 0.0 - 2.0 mmol/L   Sodium 131 (L) 135 - 145 mmol/L   Potassium 4.7 3.5 - 5.1 mmol/L   Calcium, Ion 1.16 1.15 - 1.40 mmol/L   HCT 39.0 36.0 - 46.0 %   Hemoglobin 13.3 12.0 - 15.0 g/dL   Patient temperature 16.1 F    Collection site RADIAL, ALLEN'S TEST ACCEPTABLE    Drawn by RT    Sample type ARTERIAL   CBG monitoring, ED     Status: Abnormal   Collection Time: 11/16/22  3:15 PM  Result Value Ref Range   Glucose-Capillary 53 (L) 70 - 99 mg/dL  CBG monitoring, ED     Status: Abnormal   Collection Time: 11/16/22  4:04 PM  Result Value Ref Range   Glucose-Capillary 111 (H) 70 - 99 mg/dL  POC CBG, ED     Status: Abnormal   Collection Time: 11/16/22  8:46 PM  Result Value Ref Range   Glucose-Capillary 68 (L) 70 - 99 mg/dL   CT HEAD WO CONTRAST  Result Date: 11/16/2022 CLINICAL DATA:  Mental status change, unknown cause EXAM: CT HEAD WITHOUT CONTRAST TECHNIQUE: Contiguous axial images were obtained from the base of the skull through the vertex without intravenous contrast. RADIATION DOSE REDUCTION: This exam was performed according to the departmental dose-optimization program which includes automated exposure control, adjustment of the mA and/or kV according to patient size and/or use of iterative reconstruction technique. COMPARISON:  05/15/2022 FINDINGS: Brain: No evidence of acute infarction, hemorrhage, hydrocephalus, extra-axial collection or mass lesion/mass effect. Remote left parietal lobe infarct. Patchy low-density changes within the periventricular and subcortical white matter most compatible  with chronic microvascular ischemic change. Moderate diffuse cerebral volume loss. Vascular: Atherosclerotic calcifications involving the large vessels of the skull base. No unexpected hyperdense vessel. Skull: Normal. Negative for fracture or focal lesion. Sinuses/Orbits: No acute finding. Other: None. IMPRESSION: 1. No acute intracranial abnormality. 2. Chronic microvascular ischemic change and cerebral volume loss. Remote left parietal lobe infarct. Electronically Signed   By: Duanne Guess D.O.   On: 11/16/2022 15:14   DG Chest Port 1 View  Result Date: 11/16/2022 CLINICAL DATA:  Aspiration.  Hypoxia. EXAM: PORTABLE CHEST 1 VIEW COMPARISON:  05/15/2022 FINDINGS: Heart size remains within normal limits. Permanent pacemaker remains in appropriate position. Aortic atherosclerotic calcification incidentally noted. Low lung volumes are again seen bilaterally, with mild bibasilar atelectasis. Lungs are otherwise clear. No evidence of pleural effusion. IMPRESSION: Low lung volumes and mild bibasilar  atelectasis. Electronically Signed   By: Danae Orleans M.D.   On: 11/16/2022 13:39     Assessment and Plan: * Acute respiratory failure Thought to be secondary to aspiration pneumonitis versus pneumonia On Unasyn Long discussion with palliative and the EDP around goals of care for this patient.  The family finally decided to make her DNR/DNI however they continue to want aggressive treatment with things like antibiotics.  This is an ongoing conversation and the family may move to comfort care in the next few days depending on how she responds.  Seizures Continue Depakote and Dilantin  Abdominal distention Patient with history of Roux-en-Y Her abdomen is distended now Holding tube feeds Abdominal x-ray  History of cerebral infarction With significant ongoing issues Patient has a tube feeding but we are holding this due to her aspiration pneumonia Expressive aphasia, though the patient was verbal  until January of this year  Expressive aphasia Currently nonverbal Significant risk of aspiration  Diabetes mellitus without complication Currently hypoglycemic On D5 containing IV fluid  Essential hypertension BP is controlled on metoprolol  Hypothyroidism Check TSH Has been off her meds recently      Advance Care Planning:   Code Status: DNR discussed with family at bedside  Consults: Palliative care  Family Communication: 3 daughters, several granddaughters at bedside  Severity of Illness: The appropriate patient status for this patient is OBSERVATION. Observation status is judged to be reasonable and necessary in order to provide the required intensity of service to ensure the patient's safety. The patient's presenting symptoms, physical exam findings, and initial radiographic and laboratory data in the context of their medical condition is felt to place them at decreased risk for further clinical deterioration. Furthermore, it is anticipated that the patient will be medically stable for discharge from the hospital within 2 midnights of admission.   Author: Reva Bores, MD 11/16/2022 9:11 PM  For on call review [email or web address].

## 2022-11-16 NOTE — Assessment & Plan Note (Signed)
BP is controlled on metoprolol

## 2022-11-16 NOTE — ED Notes (Signed)
Patient has been suctioned multiple times, Kathleen Harman, MD aware.

## 2022-11-16 NOTE — Assessment & Plan Note (Addendum)
Currently hypoglycemic On D5 containing IV fluid

## 2022-11-16 NOTE — Progress Notes (Signed)
ABG ordered, and obtained, while patient was on NRB.  Patient transitioned to 3L [place].  Currently tolerating well.      Latest Reference Range & Units 11/16/22 14:18  Sample type  ARTERIAL  pH, Arterial 7.35 - 7.45  7.414  pCO2 arterial 32 - 48 mmHg 56.4 (H)  pO2, Arterial 83 - 108 mmHg 335 (H)  TCO2 22 - 32 mmol/L 38 (H)  Acid-Base Excess 0.0 - 2.0 mmol/L 9.0 (H)  Bicarbonate 20.0 - 28.0 mmol/L 36.1 (H)  O2 Saturation % 100  Patient temperature  98.5 F  Collection site  RADIAL, ALLEN'S TEST ACCEPTABLE

## 2022-11-16 NOTE — Assessment & Plan Note (Signed)
With significant ongoing issues Patient has a tube feeding but we are holding this due to her aspiration pneumonia Expressive aphasia, though the patient was verbal until January of this year

## 2022-11-16 NOTE — Consult Note (Signed)
Consultation Note Date: 11/16/2022   Patient Name: Kathleen Jensen  DOB: 1951-01-20  MRN: [id number]  Age / Sex: 72 y.o., female  PCP: Patient, No Pcp Per Referring Physician: Rondel Baton, MD  Reason for Consultation: Establishing goals of care, "hypoxia due to aspiration, stroke, non-verbal, bedbound "  HPI/Patient Profile: 72 y.o. 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  Of note, PMT has had goals of care discussions with family in the past and they elected to continue full scope care with full attempts at resuscitation with time-limited trials but would not want indefinite life support.  Clinical Assessment and Goals of Care:  Received notification Dr. Eloise Harman called PMT phone requesting patient to be prioritized. Discussed case in detail with Dr. Eloise Harman - patient is high risk for intubation due to inability to manage secretions and protect airway. He has spoken to patient's granddaughter and daughter who indicate they would want to continue full code/full scope care but would benefit from continued/ongoing discussions around intubation in context of patient's chronic diseases and comorbidities.   I have reviewed medical records including EPIC notes, labs, and imaging. Received report from primary RN - acute concern of excessive respiratory secretions.   Went to visit patient at bedside - no family/visitors present - primary RN present providing frequent oral suctioning. Patient was  lying in bed - her eyes are open; however, she is not verbally responsive and does not indicate she is aware of our presence. Signs and non-verbal gestures of discomfort noted. Excessive respiratory secretions audible without use of a stethoscope.  Patient does appear at increased risk for intubation at this time.  Foul-smelling urine noted. She is on non-rebreather.   3:52 PM Per Dr. Silvano Rusk discussion with granddaughter/Myia, she was on her way to the hospital. Attempted to call granddaughter/Myia to try coordinate a meeting upon her arrival to discuss diagnosis, prognosis, GOC, EOL wishes, disposition, and options (hopeful to include daughter/[name]/noted HCPOA but without documents) - no answer - confidential voicemail left and PMT phone number provided with request to return call as soon as she is able.  3:54 PM Attempted to call daughter/[name] to discuss diagnosis, prognosis, GOC, EOL wishes, disposition, and options - no answer - voicemail box was full, could not leave voicemail. Per Dr. Eloise Harman, she is out of town.  4:56 PM Notified by Dr. Eloise Harman he spoke with daughter and she is now waiting on call from PMT.  5:06 PM Called daughter/HCPOA/[name] - she requests to add her two sisters to GOC discussion via conference call - Elnita Maxwell and [place] joined meeting.  I re-introduced Palliative Medicine as specialized medical care for people living with serious illness. It focuses on providing relief from the symptoms and stress of a serious illness. The goal is to improve quality of life for both the patient and the family.  We discussed patient's current illness and what it means in the larger context of patient's on-going co-morbidities. Allowed space and time for [name] to review information as given to her by EDP with her sisters. Also reviewed patient's interval history since arrival to ED. Family have a clear understanding of her current acute  medical situation. Natural disease  trajectory and expectations at EOL were discussed. I attempted to elicit values and goals of care important to the patient. The difference between aggressive medical intervention and comfort care was considered in light of the patient's goals of care.   Reviewed increased oxygen requirements with family. Reviewed NRB, how she is not a candidate for BiPap due to inability to manage secretions and aspirating, and potential for intubation. Elzie Rings explains that patient was previously intubated and thought she would not come off vent, but she did. Education provided that patient's conditions have progressed and it is still felt she is at low chance of successful weaning from ventilator. Detailed discussion had around recurrent aspiration and cycle of infections.  We talked about transition to comfort measures in house and what that would entail inclusive of medications to control pain, dyspnea, agitation, nausea, and itching. We discussed stopping all unnecessary measures such as blood draws, needle sticks, oxygen, antibiotics, CBGs/insulin, cardiac monitoring, IVF, and frequent vital signs. Education provided that other non-pharmacological interventions would be utilized for holistic support and comfort such as spiritual support if requested, repositioning, music therapy, offering comfort feeds, and/or therapeutic listening. All care would focus on how the patient is looking and feeling.   Two daughters were in agreement with full comfort measures, saying "she's been through a lot," "there is no reason to do all of that to her," "I don't want to see her go through more pain," "her body is shutting down and declining," "she has infections all the time," "she's suffering." [name] indicates that she "doesn't know what to say" and she's "speechless." Allowed space and time for her to process information and discuss with her two sisters; however, she is not agreeable to full comfort measures at this time. She  requests time to process information given to them.  All three daughters are traveling and on their way to the hospital - latest arrival time is 8pm. Family would like to meet together to further discuss goals prior to making final decisions for comfort care. In the interim, family are all in agreement to continue current full scope care and are ok with intubation if indicated.  Discussed with family the importance of continued conversation with each other and the medical providers regarding overall plan of care and treatment options, ensuring decisions are within the context of the patient's values and GOCs.    Questions and concerns were addressed. The patient/family was encouraged to call with questions and/or concerns. PMT number was provided.  Spoke with Dr. Eloise Harman to provide updates regarding GOC and family arrival time of 8p.  Primary Decision Maker: HCPOA/Legal Guardian per chart- Parke Poisson - do not have HCPOA or Legal Guardian documents     SUMMARY OF RECOMMENDATIONS   Continue full code/full scope care at this time 3 daughters are all traveling to hospital and will make final decisions regarding transition to full comfort measures vs continuing full scope treatment after they arrive - ETA 8pm. In the interim, if patient declines they would want to proceed with intubation Recommendation was given to family for transition to full comfort measures and DNR/DNI PMT will be off service after 8pm - appreciate EDP or admitting's assistance in helping family navigate their final decisions. PMT will follow up tomorrow 4/21   Code Status/Advance Care Planning: Full code  Palliative Prophylaxis:  Aspiration, Frequent Pain Assessment, and Turn Reposition  Additional Recommendations (Limitations, Scope, Preferences): Full Scope Treatment  Psycho-social/Spiritual:  Desire for further Chaplaincy support:  n/a Created space and opportunity for patient to express thoughts and  feelings regarding patient's current medical situation.  Emotional support and therapeutic listening provided.  Prognosis:  Poor long term prognosis   Discharge Planning: To Be Determined      Primary Diagnoses: Present on Admission: **None**   I have reviewed the medical record, interviewed the patient and family, and examined the patient. The following aspects are pertinent.  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 gastric bypass   GERD (gastroesophageal reflux disease)    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    Hyperlipemia    Hypertension    Insomnia    Intracerebral hemorrhage, intraventricular    Muscle weakness    Other abnormalities of gait and mobility    Seizures    Stroke    Unsteadiness on feet    Vitamin B deficiency    Social History   Socioeconomic History   Marital status: Divorced    Spouse name: Not on file   Number of children: 3   Years of education: 52yrs   Highest education level: Not on file  Occupational History   Occupation: Retired    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   Caffeine Use: 1 cup daily   1 dog   Social Determinants of Health   Financial Resource Strain: Not on file  Food  Insecurity: No Food Insecurity (05/16/2022)   Hunger Vital Sign    Worried About Running Out of Food in the Last Year: Never true    Ran Out of Food in the Last Year: Never true  Transportation Needs: No Transportation Needs (05/17/2022)   PRAPARE - Administrator, Civil Service (Medical): No    Lack of Transportation (Non-Medical): No  Physical Activity: Not on file  Stress: Not on file  Social Connections: Not on file   Family History  Problem Relation Age of Onset   High blood pressure Mother    Diabetes Mother    Diabetes Father    Stroke Father    Cancer Sister        Colorectal cancer   Cancer Brother        colon   Scheduled Meds:  naLOXone (NARCAN)  injection  0.4 mg Intravenous Once   Continuous Infusions: PRN Meds:. Medications Prior to Admission:  Prior to Admission medications   Medication Sig Start Date End Date Taking? Authorizing Provider  acetaminophen (TYLENOL) 160 MG/5ML liquid Place 960 mg into feeding tube every 8 (eight) hours.   Yes [provider]  Acidophilus Lactobacillus CAPS Give 1 capsule by tube  daily.   Yes [provider]  amiodarone (PACERONE) 200 MG tablet 200 mg BID for 10 days then 200mg  daily for 21 days. Follow up with cardiology for further dosing Patient taking differently: Place 200 mg into feeding tube daily. 02/22/22  Yes Glade Lloyd, MD  ascorbic acid (VITAMIN C) 500 MG tablet Place 1 tablet (500 mg total) into feeding tube 2 (two) times daily. Patient taking differently: Place 500 mg into feeding tube daily. 02/22/22  Yes Glade Lloyd, MD  aspirin EC 81 MG tablet 81 mg at bedtime. Via tube   Yes [provider]  Cholecalciferol 25 MCG (1000 UT) capsule Place 1,000 Units into feeding tube at bedtime. 03/12/22  Yes [provider]  famotidine (PEPCID) 40 MG/5ML suspension Place 40 mg into feeding tube daily. 03/25/22  Yes [provider]  folic acid (FOLVITE) 800 MCG tablet Place  800 mcg into feeding tube daily.   Yes [provider]  HYDROcodone-acetaminophen (NORCO/VICODIN) 5-325 MG tablet Take 1 tablet by mouth daily as needed for moderate pain.   Yes [provider]  Menthol, Topical Analgesic, (BIOFREEZE) 4 % GEL Apply 2 g topically 2 (two) times daily. Upper left arm   Yes [provider]  phenytoin (DILANTIN) 125 MG/5ML suspension Place 7 mLs (175 mg total) into feeding tube 2 (two) times daily. 05/18/22  Yes Mabe, Earvin Hansen, MD  senna-docusate (SENOKOT-S) 8.6-50 MG tablet Place 2 tablets into feeding tube 2 (two) times daily. 02/22/22  Yes Glade Lloyd, MD  Simethicone 80 MG TABS 1 tablet (80 mg total) by PEG Tube route in the morning and at bedtime. Patient taking differently: Give 80 mg by tube in the morning and at bedtime. Simethicone 80mg  chewable tablet 02/22/22  Yes Glade Lloyd, MD  simvastatin (ZOCOR) 10 MG tablet Place 1 tablet (10 mg total) into feeding tube at bedtime. 02/22/22  Yes Glade Lloyd, MD  valproic acid (DEPAKENE) 250 MG/5ML solution Place 10 mLs (500 mg total) into feeding tube 4 (four) times daily. 02/22/22  Yes Glade Lloyd, MD  Zinc 50 MG TABS Give 50 mg by tube daily.   Yes [provider]  hydrOXYzine (ATARAX) 10 MG/5ML syrup Place 10 mg into feeding tube 3 (three) times daily. Patient not taking: Reported on 11/16/2022 03/22/22   [provider]  levETIRAcetam (KEPPRA) 100 MG/ML solution Place 1,000 mg into feeding tube 2 (two) times daily.    [provider]  levothyroxine (SYNTHROID) 75 MCG tablet Place 1 tablet (75 mcg total) into feeding tube daily before breakfast. 02/22/22   Glade Lloyd, MD  metoprolol tartrate (LOPRESSOR) 25 MG tablet Place 12.5 mg into feeding tube 2 (two) times daily.    [provider]  Nutritional Supplements (FEEDING SUPPLEMENT, JEVITY 1.2 CAL,) LIQD Place 1,000 mLs into feeding tube continuous. At 73ml/hr Patient not taking: Reported on  05/15/2022 02/22/22   Glade Lloyd, MD  Nutritional Supplements (FEEDING SUPPLEMENT, PROSOURCE TF,) liquid Place 45 mLs into feeding tube 3 (three) times daily. Patient not taking: Reported on 05/15/2022 02/22/22   Glade Lloyd, MD  polyethylene glycol (MIRALAX / GLYCOLAX) 17 g packet Place 34 g into feeding tube daily as needed for mild constipation. Patient not taking: Reported on 05/15/2022 02/22/22   Glade Lloyd, MD  pregabalin (LYRICA) 75 MG capsule Take 75 mg by mouth 3 (three) times daily.    [provider]  Water For Irrigation, Sterile (FREE WATER) SOLN Place 100 mLs into feeding tube every 4 (four) hours. Patient  not taking: Reported on 05/15/2022 02/22/22   Glade Lloyd, MD   Allergies  Allergen Reactions   Tape Rash   Demerol [Meperidine Hcl] Nausea And Vomiting   Sulfonamide Derivatives Itching   Bacitracin-Polymyxin B Dermatitis   Oxycontin [Oxycodone] Itching   Review of Systems  Unable to perform ROS: Acuity of condition    Physical Exam Vitals and nursing note reviewed.  Constitutional:      General: She is not in acute distress.    Appearance: She is ill-appearing.  Pulmonary:     Effort: No respiratory distress.     Comments: Excessive respiratory secretions Skin:    General: Skin is warm and dry.  Neurological:     Motor: Weakness present.  Psychiatric:        Speech: She is noncommunicative.     Vital Signs: BP (!) 139/49 (BP Location: Left Arm)   Pulse 60   Temp 98.5 F (36.9 C) (Axillary)   Resp 20   SpO2 100%  Pain Scale: Faces       SpO2: SpO2: 100 % O2 Device:SpO2: 100 % O2 Flow Rate: .O2 Flow Rate (L/min): (S) 3 L/min  IO: Intake/output summary: No intake or output data in the 24 hours ending 11/16/22 1428  LBM:   Baseline Weight:   Most recent weight:       Palliative Assessment/Data: PPS 10%      Signed by: Haskel Khan, NP   Please contact Palliative Medicine Team phone at [phone] for questions and  concerns.  For individual provider: See Amion  *Portions of this note are a verbal dictation therefore any spelling and/or grammatical errors are due to the "Dragon Medical One" system interpretation.

## 2022-11-16 NOTE — Hospital Course (Signed)
Patient is a 72 year old female with history of right-sided hemiplegia and expressive aphasia secondary to a stroke in 2018.  She has a history of A-fib, pacemaker in place, seizures, HTN, DM, hypothyroidism, anemia. She has a PEG tube in place for approximately the last year due to poor p.o. intake.  She has had a stepwise decline over the last year following sepsis from UTI and other infectious processes.  Patient has not indwelling Foley as well.  Patient resides at [facility] usually and has been unable to talk for the last 4 months.  She presents to the ED with possible aspiration.  Patient was noted to have gurgling on respirations that came in place this morning and foul-smelling urine.  EMS brought her to the hospital she required nonrebreather.  She was then weaned to 3 L via nasal cannula and then aspirated some more while in the ED despite not having any thing by mouth or any tube feeds going and advance again back to nonrebreather to support her respirations.  In the ED palliative care came and saw the patient had a long lengthy conversation with the family.  Given her worsening course the family decided to make her DNR/DNI but still wanted aggressive medical treatment.

## 2022-11-16 NOTE — ED Triage Notes (Signed)
Pt BIB EMS from [facility], per EMS staff states to their knowledge the patient was fine last night, and this morning, around lunch time noticed she was gurgling, low O2, and weeping, with a foul smell and discoloration to her urine.  Patient came in with an indwelling, per EMS patient did not speak but moaned, staff at the facility states patient normally does not speak but they were not very familiar with her.    163/89 HR 67 95 % 12L CBG 100

## 2022-11-16 NOTE — Assessment & Plan Note (Signed)
Continue Depakote and Dilantin

## 2022-11-16 NOTE — Assessment & Plan Note (Addendum)
Currently nonverbal Significant risk of aspiration

## 2022-11-16 NOTE — Assessment & Plan Note (Signed)
Patient with history of Roux-en-Y Her abdomen is distended now Holding tube feeds Abdominal x-ray

## 2022-11-16 NOTE — Assessment & Plan Note (Signed)
Thought to be secondary to aspiration pneumonitis versus pneumonia On Unasyn Long discussion with palliative and the EDP around goals of care for this patient.  The family finally decided to make her DNR/DNI however they continue to want aggressive treatment with things like antibiotics.  This is an ongoing conversation and the family may move to comfort care in the next few days depending on how she responds.

## 2022-11-16 NOTE — Progress Notes (Signed)
ABG attempted 2X unsuccessfully. I will ask another RT to attempt.  

## 2022-11-16 NOTE — ED Notes (Signed)
ED TO INPATIENT HANDOFF REPORT  ED Nurse Name and Phone #: Frederica Kuster   S Name/Age/Gender Kathleen Jensen 72 y.o. female Room/Bed: 006C/006C  Code Status   Code Status: DNR  Home/SNF/Other Skilled nursing facility Patient oriented to: self Is this baseline? No     Triage Complete: Triage complete  Chief Complaint Acute respiratory failure [J96.00]  Triage Note Pt BIB EMS from [facility], per EMS staff states to their knowledge the patient was fine last night, and this morning, around lunch time noticed she was gurgling, low O2, and weeping, with a foul smell and discoloration to her urine.  Patient came in with an indwelling, per EMS patient did not speak but moaned, staff at the facility states patient normally does not speak but they were not very familiar with her.    163/89 HR 67 95 % 12L CBG 100   Allergies Allergies  Allergen Reactions   Tape Rash   Demerol [Meperidine Hcl] Nausea And Vomiting   Sulfonamide Derivatives Itching   Bacitracin-Polymyxin B Dermatitis   Oxycontin [Oxycodone] Itching    Level of Care/Admitting Diagnosis ED Disposition     ED Disposition  Admit   Condition  --   Comment  Hospital Area: [facility] [[id number]]  Level of Care: Progressive [102]  Admit to Progressive based on following criteria: RESPIRATORY PROBLEMS hypoxemic/hypercapnic respiratory failure that is responsive to NIPPV (BiPAP) or High Flow Nasal Cannula (6-80 lpm). Frequent assessment/intervention, no > Q2 hrs < Q4 hrs, to maintain oxygenation and pulmonary hygiene.  May admit patient to [facility] or [facility] if equivalent level of care is available:: Yes  Covid Evaluation: Asymptomatic - no recent exposure (last 10 days) testing not required  Diagnosis: Acute respiratory failure [518.81.ICD-9-CM]  Admitting Physician: Samara Snide  Attending Physician: Samara Snide  Certification:: I certify this patient will  need inpatient services for at least 2 midnights  Estimated Length of Stay: 3          B Medical/Surgery History 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 gastric bypass   GERD (gastroesophageal reflux disease)    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    Hyperlipemia    Hypertension    Insomnia    Intracerebral hemorrhage, intraventricular    Muscle weakness    Other abnormalities of gait and mobility    Seizures    Stroke    Unsteadiness on feet    Vitamin B deficiency    Past Surgical History:  Procedure Laterality Date   BACK SURGERY     Dec 18, 2016 Dr.Torrealba   BREAST BIOPSY Right    BREAST EXCISIONAL BIOPSY     CERVICAL SPINE SURGERY     C4   CESAREAN SECTION     GASTRIC BYPASS  03/2007   TONSILLECTOMY       A IV Location/Drains/Wounds Patient Lines/Drains/Airways Status     Active Line/Drains/Airways     Name Placement date Placement time Site Days   Peripheral IV 11/16/22 20 G Left Antecubital 11/16/22  1430  Antecubital  less than 1   Gastrostomy/Enterostomy PEG-jejunostomy LUQ --  --  LUQ  --  Urethral Catheter Elieen NT Latex 14 Fr. 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 02/15/22  2200  -- 274            Intake/Output Last 24 hours No intake or output data in the 24 hours ending 11/16/22 2112  Labs/Imaging Results for orders placed or performed during the hospital encounter of 11/16/22 (from the past 48 hour(s))  Ammonia     Status: Abnormal   Collection Time:  11/16/22  2:11 PM  Result Value Ref Range   Ammonia 43 (H) 9 - 35 umol/L    Comment: Performed at [facility], [address] [place], [place] [address]  Comprehensive metabolic panel     Status: Abnormal   Collection Time: 11/16/22  2:11 PM  Result Value Ref Range   Sodium 129 (L) 135 - 145 mmol/L   Potassium 4.6 3.5 - 5.1 mmol/L   Chloride 90 (L) 98 - 111 mmol/L   CO2 31 22 - 32 mmol/L   Glucose, Bld 75 70 - 99 mg/dL    Comment: Glucose reference range applies only to samples taken after fasting for at least 8 hours.   BUN 20 8 - 23 mg/dL   Creatinine, Ser 6.04 0.44 - 1.00 mg/dL   Calcium 8.1 (L) 8.9 - 10.3 mg/dL   Total Protein 8.1 6.5 - 8.1 g/dL   Albumin 1.9 (L) 3.5 - 5.0 g/dL   AST 48 (H) 15 - 41 U/L   ALT 19 0 - 44 U/L   Alkaline Phosphatase 134 (H) 38 - 126 U/L   Total Bilirubin 0.5 0.3 - 1.2 mg/dL   GFR, Estimated >54 >09 mL/min    Comment: (NOTE) Calculated using the CKD-EPI Creatinine Equation (2021)    Anion gap 8 5 - 15    Comment: Performed at [facility], [address], [place], [place] [address]  Ethanol     Status: None   Collection Time: 11/16/22  2:11 PM  Result Value Ref Range   Alcohol, Ethyl (B) <10 <10 mg/dL    Comment: (NOTE) Lowest detectable limit for serum alcohol is 10 mg/dL.  For medical purposes only. Performed at [facility], [address] [place], [place] [address]   CBC with Differential/Platelet     Status: Abnormal   Collection Time: 11/16/22  2:11 PM  Result Value Ref Range   WBC 10.3 4.0 - 10.5 K/uL   RBC 4.02 3.87 - 5.11 MIL/uL   Hemoglobin 12.5 12.0 - 15.0 g/dL   HCT 56.2 13.0 - 86.5 %   MCV 92.5 80.0 - 100.0 fL   MCH 31.1 26.0 - 34.0 pg   MCHC 33.6 30.0 - 36.0 g/dL   RDW 78.4 69.6 - 29.5 %   Platelets 202 150 - 400 K/uL   nRBC 0.0 0.0 - 0.2 %   Neutrophils Relative % 54 %   Neutro Abs 5.6 1.7 - 7.7 K/uL   Lymphocytes Relative 33 %   Lymphs Abs 3.4 0.7 - 4.0 K/uL   Monocytes Relative 11 %    Monocytes Absolute 1.1 (H) 0.1 - 1.0 K/uL   Eosinophils Relative 1 %   Eosinophils Absolute 0.1 0.0 - 0.5 K/uL   Basophils Relative 1 %   Basophils Absolute  0.1 0.0 - 0.1 K/uL   Immature Granulocytes 0 %   Abs Immature Granulocytes 0.03 0.00 - 0.07 K/uL    Comment: Performed at [facility], [address] [place], [place] [address]  Valproic acid level     Status: None   Collection Time: 11/16/22  2:11 PM  Result Value Ref Range   Valproic Acid Lvl 53 50.0 - 100.0 ug/mL    Comment: Performed at [facility], [address] [place], [place] [address]  Resp panel by RT-PCR (RSV, Flu A&B, Covid) Anterior Nasal Swab     Status: None   Collection Time: 11/16/22  2:11 PM   Specimen: Anterior Nasal Swab  Result Value Ref Range   SARS Coronavirus 2 by RT PCR NEGATIVE NEGATIVE   Influenza A by PCR NEGATIVE NEGATIVE   Influenza B by PCR NEGATIVE NEGATIVE    Comment: (NOTE) The Xpert Xpress SARS-CoV-2/FLU/RSV plus assay is intended as an aid in the diagnosis of influenza from Nasopharyngeal swab specimens and should not be used as a sole basis for treatment. Nasal washings and aspirates are unacceptable for Xpert Xpress SARS-CoV-2/FLU/RSV testing.  Fact Sheet for Patients: BloggerCourse.com  Fact Sheet for Healthcare Providers: SeriousBroker.it  This test is not yet approved or cleared by the [place] FDA and has been authorized for detection and/or diagnosis of SARS-CoV-2 by FDA under an Emergency Use Authorization (EUA). This EUA will remain in effect (meaning this test can be used) for the duration of the COVID-19 declaration under Section 564(b)(1) of the Act, 21 U.S.C. section 360bbb-3(b)(1), unless the authorization is terminated or revoked.     Resp Syncytial Virus by PCR NEGATIVE NEGATIVE    Comment: (NOTE) Fact Sheet for Patients: BloggerCourse.com  Fact Sheet for Healthcare  Providers: SeriousBroker.it  This test is not yet approved or cleared by the [place] FDA and has been authorized for detection and/or diagnosis of SARS-CoV-2 by FDA under an Emergency Use Authorization (EUA). This EUA will remain in effect (meaning this test can be used) for the duration of the COVID-19 declaration under Section 564(b)(1) of the Act, 21 U.S.C. section 360bbb-3(b)(1), unless the authorization is terminated or revoked.  Performed at [facility], [address], [place], [place] [address]   I-Stat arterial blood gas, ED     Status: Abnormal   Collection Time: 11/16/22  2:18 PM  Result Value Ref Range   pH, Arterial 7.414 7.35 - 7.45   pCO2 arterial 56.4 (H) 32 - 48 mmHg   pO2, Arterial 335 (H) 83 - 108 mmHg   Bicarbonate 36.1 (H) 20.0 - 28.0 mmol/L   TCO2 38 (H) 22 - 32 mmol/L   O2 Saturation 100 %   Acid-Base Excess 9.0 (H) 0.0 - 2.0 mmol/L   Sodium 131 (L) 135 - 145 mmol/L   Potassium 4.7 3.5 - 5.1 mmol/L   Calcium, Ion 1.16 1.15 - 1.40 mmol/L   HCT 39.0 36.0 - 46.0 %   Hemoglobin 13.3 12.0 - 15.0 g/dL   Patient temperature 41.3 F    Collection site RADIAL, ALLEN'S TEST ACCEPTABLE    Drawn by RT    Sample type ARTERIAL   CBG monitoring, ED     Status: Abnormal   Collection Time: 11/16/22  3:15 PM  Result Value Ref Range   Glucose-Capillary 53 (L) 70 - 99 mg/dL    Comment: Glucose reference range applies only to samples taken after fasting for at least 8 hours.  CBG monitoring, ED     Status: Abnormal   Collection Time: 11/16/22  4:04 PM  Result Value Ref Range   Glucose-Capillary 111 (H) 70 - 99 mg/dL    Comment: Glucose reference range applies only to samples taken after fasting for at least 8 hours.  POC CBG, ED     Status: Abnormal   Collection Time: 11/16/22  8:46 PM  Result Value Ref Range   Glucose-Capillary 68 (L) 70 - 99 mg/dL    Comment: Glucose reference range applies only to samples taken after fasting for  at least 8 hours.   CT HEAD WO CONTRAST  Result Date: 11/16/2022 CLINICAL DATA:  Mental status change, unknown cause EXAM: CT HEAD WITHOUT CONTRAST TECHNIQUE: Contiguous axial images were obtained from the base of the skull through the vertex without intravenous contrast. RADIATION DOSE REDUCTION: This exam was performed according to the departmental dose-optimization program which includes automated exposure control, adjustment of the mA and/or kV according to patient size and/or use of iterative reconstruction technique. COMPARISON:  05/15/2022 FINDINGS: Brain: No evidence of acute infarction, hemorrhage, hydrocephalus, extra-axial collection or mass lesion/mass effect. Remote left parietal lobe infarct. Patchy low-density changes within the periventricular and subcortical white matter most compatible with chronic microvascular ischemic change. Moderate diffuse cerebral volume loss. Vascular: Atherosclerotic calcifications involving the large vessels of the skull base. No unexpected hyperdense vessel. Skull: Normal. Negative for fracture or focal lesion. Sinuses/Orbits: No acute finding. Other: None. IMPRESSION: 1. No acute intracranial abnormality. 2. Chronic microvascular ischemic change and cerebral volume loss. Remote left parietal lobe infarct. Electronically Signed   By: Duanne Guess D.O.   On: 11/16/2022 15:14   DG Chest Port 1 View  Result Date: 11/16/2022 CLINICAL DATA:  Aspiration.  Hypoxia. EXAM: PORTABLE CHEST 1 VIEW COMPARISON:  05/15/2022 FINDINGS: Heart size remains within normal limits. Permanent pacemaker remains in appropriate position. Aortic atherosclerotic calcification incidentally noted. Low lung volumes are again seen bilaterally, with mild bibasilar atelectasis. Lungs are otherwise clear. No evidence of pleural effusion. IMPRESSION: Low lung volumes and mild bibasilar atelectasis. Electronically Signed   By: Danae Orleans M.D.   On: 11/16/2022 13:39    Pending Labs Unresulted  Labs (From admission, onward)     Start     Ordered   11/16/22 1302  Rapid urine drug screen (hospital performed)  Once,   STAT        11/16/22 1302   11/16/22 1302  Urinalysis, Routine w reflex microscopic -Urine, Catheterized; In and out catheter  Once,   URGENT       Question Answer Comment  Specimen Source Urine, Catheterized   Specimen Source In and out catheter      11/16/22 1302   11/16/22 1300  Phenytoin level, free and total  Once,   STAT        11/16/22 1302            Vitals/Pain Today's Vitals   11/16/22 1707 11/16/22 1800 11/16/22 1900 11/16/22 2000  BP:  123/82 (!) 170/97 (!) 138/99  Pulse:  (!) 59 60 (!) 59  Resp:  20 20 20   Temp: 98.7 F (37.1 C)     TempSrc: Axillary     SpO2:  (!) 85% 95% 98%    Isolation Precautions No active isolations  Medications Medications  Ampicillin-Sulbactam (UNASYN) 3 g in sodium chloride 0.9 % 100 mL IVPB (3 g Intravenous New Bag/Given 11/16/22 2107)  dextrose 5 %-0.9 % sodium chloride infusion ( Intravenous New  Bag/Given 11/16/22 2106)  dextrose 50 % solution 50 mL (50 mLs Intravenous Given 11/16/22 1525)  dextrose 50 % solution 50 mL (50 mLs Intravenous Given 11/16/22 2107)    Mobility non-ambulatory     Focused Assessments Pulmonary Assessment Handoff:  Lung sounds:   O2 Device: (S) Nasal Cannula O2 Flow Rate (L/min): (S) 3 L/min    R Recommendations: See Admitting Provider Note  Report given to:   Additional Notes: N/A

## 2022-11-16 NOTE — ED Notes (Signed)
Pt suctioned due to gurgling.

## 2022-11-17 DIAGNOSIS — J9601 Acute respiratory failure with hypoxia: Secondary | ICD-10-CM | POA: Diagnosis not present

## 2022-11-17 DIAGNOSIS — R569 Unspecified convulsions: Secondary | ICD-10-CM

## 2022-11-17 DIAGNOSIS — I10 Essential (primary) hypertension: Secondary | ICD-10-CM

## 2022-11-17 DIAGNOSIS — E039 Hypothyroidism, unspecified: Secondary | ICD-10-CM

## 2022-11-17 DIAGNOSIS — Z931 Gastrostomy status: Secondary | ICD-10-CM | POA: Diagnosis not present

## 2022-11-17 DIAGNOSIS — Z8673 Personal history of transient ischemic attack (TIA), and cerebral infarction without residual deficits: Secondary | ICD-10-CM

## 2022-11-17 DIAGNOSIS — R140 Abdominal distension (gaseous): Secondary | ICD-10-CM

## 2022-11-17 DIAGNOSIS — T17908A Unspecified foreign body in respiratory tract, part unspecified causing other injury, initial encounter: Secondary | ICD-10-CM | POA: Diagnosis not present

## 2022-11-17 DIAGNOSIS — R401 Stupor: Secondary | ICD-10-CM

## 2022-11-17 DIAGNOSIS — Z66 Do not resuscitate: Secondary | ICD-10-CM

## 2022-11-17 LAB — CBG MONITORING, ED: Glucose-Capillary: 135 mg/dL — ABNORMAL HIGH (ref 70–99)

## 2022-11-17 LAB — TSH: TSH: 1.923 u[IU]/mL (ref 0.350–4.500)

## 2022-11-17 MED ORDER — MEDIHONEY WOUND/BURN DRESSING EX PSTE
1.0000 | PASTE | Freq: Every day | CUTANEOUS | Status: DC
  Administered 2022-11-17 – 2022-12-02 (×16): 1 via TOPICAL
  Filled 2022-11-17 (×2): qty 44

## 2022-11-17 MED ORDER — CHLORHEXIDINE GLUCONATE CLOTH 2 % EX PADS: 6.0000 | MEDICATED_PAD | Freq: Every day | CUTANEOUS | Status: DC

## 2022-11-17 MED ORDER — ORAL CARE MOUTH RINSE
15.0000 mL | OROMUCOSAL | Status: DC
  Administered 2022-11-17 – 2022-12-03 (×63): 15 mL via OROMUCOSAL

## 2022-11-17 MED ORDER — MORPHINE SULFATE (PF) 2 MG/ML IV SOLN
2.0000 mg | INTRAVENOUS | Status: DC | PRN
  Administered 2022-11-19 – 2022-12-01 (×9): 2 mg via INTRAVENOUS
  Filled 2022-11-17 (×10): qty 1

## 2022-11-17 MED ORDER — SODIUM CHLORIDE 0.9 % IV SOLN
3.0000 g | Freq: Four times a day (QID) | INTRAVENOUS | Status: DC
  Administered 2022-11-17 – 2022-11-24 (×29): 3 g via INTRAVENOUS
  Filled 2022-11-17 (×31): qty 8

## 2022-11-17 MED ORDER — ORAL CARE MOUTH RINSE: 15.0000 mL | OROMUCOSAL | Status: DC | PRN

## 2022-11-17 NOTE — Progress Notes (Signed)
[facility]                                                                               Lilybelle Mayeda, is a 72 y.o. female, DOB - June 06, 1951, MVH:[id number] Admit date - 11/16/2022    Outpatient Primary MD for the patient is Patient, No Pcp Per  LOS - 1  days    Brief summary   Patient is a 72 year old female with history of right-sided hemiplegia and expressive aphasia secondary to a stroke in 2018.  She has a history of A-fib, pacemaker in place, seizures, HTN, DM, hypothyroidism, anemia. She has a PEG tube in place for approximately the last year due to poor p.o. intake.  She has had a stepwise decline over the last year following sepsis from UTI and other infectious processes.  Patient has not indwelling Foley as well.  Patient resides at [facility] usually and has been unable to talk for the last 4 months.  She presents to the ED with possible aspiration.  Patient was noted to have gurgling on respirations that came in place this morning and foul-smelling urine.  EMS brought her to the hospital she required nonrebreather.  She was then weaned to 3 L via nasal cannula and then aspirated some more while in the ED despite not having any thing by mouth or any tube feeds going and advance again back to nonrebreather to support her respirations.  In the ED palliative care came and saw the patient had a long lengthy conversation with the family.  Given her worsening course the family decided to make her DNR/DNI but still wanted aggressive medical treatment.   Assessment & Plan    Assessment and Plan: * Acute respiratory failure Thought to be secondary to aspiration pneumonitis versus pneumonia On Unasyn Long discussion with palliative and the EDP around goals of care for this patient.  The family finally decided to make her DNR/DNI however they continue to want aggressive treatment with things like antibiotics.  Palliative care consulted for goals of care discussions, plan  to continue with treatment with watchful waiting, and no escalation of care if she declines and possible transition to comfort measure at that time.   Seizures Continue Depakote and Dilantin  Abdominal distention Patient with history of Roux-en-Y ABD X ray showing SBO vs ileus.  No feeds for now.    History of cerebral infarction With significant ongoing issues Patient has a tube feeding but we are holding this due to her aspiration pneumonia/ SBO. Expressive aphasia, pt totally unresponsive at this time.   Expressive aphasia Currently nonverbal Significant risk of aspiration  Diabetes mellitus without complication Currently hypoglycemic On D5 containing IV fluid  Essential hypertension Optimal BP parameters.   Hypothyroidism Check TSH Has been off her meds recently    Hyponatremia  Sodium improved from 129 to 131.   Estimated body mass index is 30.08 kg/m as calculated from the following:   Height as of 05/16/22:  (1.651 m).   Weight as of 05/16/22: 82 kg.  Code Status: DNR DVT Prophylaxis:  SCDs Start: 11/16/22 2214   Level of Care: Level of care: Progressive Family Communication: none at  bedside.   Disposition Plan:     Remains inpatient appropriate:  watchful observation on IV antibiotics.   Procedures:  None.   Consultants:   PALLIATIVE CARE.   Antimicrobials:   Anti-infectives (From admission, onward)    Start     Dose/Rate Route Frequency Ordered Stop   11/17/22 1030  Ampicillin-Sulbactam (UNASYN) 3 g in sodium chloride 0.9 % 100 mL IVPB        3 g 200 mL/hr over 30 Minutes Intravenous Every 6 hours 11/17/22 1035     11/16/22 2045  Ampicillin-Sulbactam (UNASYN) 3 g in sodium chloride 0.9 % 100 mL IVPB        3 g 200 mL/hr over 30 Minutes Intravenous  Once 11/16/22 2043 11/16/22 2137        Medications  Scheduled Meds:  acetaminophen  640 mg Per Tube Q8H   amiodarone  200 mg Per Tube Daily   aspirin  81 mg Per Tube QHS    metoprolol tartrate  12.5 mg Per Tube BID   phenytoin  175 mg Per Tube BID   valproic acid  500 mg Per Tube QID   Continuous Infusions:  ampicillin-sulbactam (UNASYN) IV 3 g (11/17/22 1224)   dextrose 5 % and 0.9% NaCl 100 mL/hr at 11/16/22 2106   PRN Meds:.HYDROcodone-acetaminophen, metoprolol tartrate    Subjective:   Laiza Hottel was seen and examined today.    Objective:   Vitals:   11/17/22 0700 11/17/22 1000 11/17/22 1203 11/17/22 1315  BP: (!) 172/138 (!) 149/87  (!) 140/88  Pulse: (!) 59 (!) 59  (!) 59  Resp: 18 18  18   Temp:   98 F (36.7 C)   TempSrc:   Axillary   SpO2: 100% 100%  100%    Intake/Output Summary (Last 24 hours) at 11/17/2022 1333 Last data filed at 11/16/2022 2137 Gross per 24 hour  Intake 100 ml  Output --  Net 100 ml   There were no vitals filed for this visit.   Exam General exam: ill appearing lady, moaning,  Respiratory system: diminished at bedside.  Diffuse rhonchi, On 100% NRB mask.  Cardiovascular system: S1 & S2 heard, bradycardia. Gastrointestinal system: Abdomen is distended.  Central nervous system: not following commands , moaning.  Extremities: feet in boots. 3+ leg edema Skin: No rashes,  Psychiatry: unable to assess.     Data Reviewed:  I have personally reviewed following labs and imaging studies   CBC Lab Results  Component Value Date   WBC 10.3 11/16/2022   RBC 4.02 11/16/2022   HGB 13.3 11/16/2022   HCT 39.0 11/16/2022   MCV 92.5 11/16/2022   MCH 31.1 11/16/2022   PLT 202 11/16/2022   MCHC 33.6 11/16/2022   RDW 15.0 11/16/2022   LYMPHSABS 3.4 11/16/2022   MONOABS 1.1 (H) 11/16/2022   EOSABS 0.1 11/16/2022   BASOSABS 0.1 11/16/2022     Last metabolic panel Lab Results  Component Value Date   NA 131 (L) 11/16/2022   K 4.7 11/16/2022   CL 90 (L) 11/16/2022   CO2 31 11/16/2022   BUN 20 11/16/2022   CREATININE 0.44 11/16/2022   GLUCOSE 75 11/16/2022   GFRNONAA >60 11/16/2022   GFRAA >60  04/14/2020   CALCIUM 8.1 (L) 11/16/2022   PHOS 3.4 05/17/2022   PROT 8.1 11/16/2022   ALBUMIN 1.9 (L) 11/16/2022   BILITOT 0.5 11/16/2022   ALKPHOS 134 (H) 11/16/2022   AST 48 (H) 11/16/2022   ALT 19 11/16/2022  ANIONGAP 8 11/16/2022    CBG (last 3)  Recent Labs    11/16/22 1604 11/16/22 2046 11/17/22 0212  GLUCAP 111* 68* 135*      Coagulation Profile: No results for input(s): "INR", "PROTIME" in the last 168 hours.   Radiology Studies: DG Abd Portable 1V  Result Date: 11/16/2022 CLINICAL DATA:  Abdominal distension. EXAM: PORTABLE ABDOMEN - 1 VIEW COMPARISON:  May 16, 2022 FINDINGS: Numerous distended, air-filled loops of bowel are seen throughout the abdomen. No radio-opaque calculi or other significant radiographic abnormality are seen. Radiopaque surgical clips are seen within the right upper quadrant. Postoperative changes are noted within the lower lumbar spine. IMPRESSION: Findings consistent with a small-bowel obstruction versus ileus. Electronically Signed   By: Aram Candela M.D.   On: 11/16/2022 22:38   CT HEAD WO CONTRAST  Result Date: 11/16/2022 CLINICAL DATA:  Mental status change, unknown cause EXAM: CT HEAD WITHOUT CONTRAST TECHNIQUE: Contiguous axial images were obtained from the base of the skull through the vertex without intravenous contrast. RADIATION DOSE REDUCTION: This exam was performed according to the departmental dose-optimization program which includes automated exposure control, adjustment of the mA and/or kV according to patient size and/or use of iterative reconstruction technique. COMPARISON:  05/15/2022 FINDINGS: Brain: No evidence of acute infarction, hemorrhage, hydrocephalus, extra-axial collection or mass lesion/mass effect. Remote left parietal lobe infarct. Patchy low-density changes within the periventricular and subcortical white matter most compatible with chronic microvascular ischemic change. Moderate diffuse cerebral volume  loss. Vascular: Atherosclerotic calcifications involving the large vessels of the skull base. No unexpected hyperdense vessel. Skull: Normal. Negative for fracture or focal lesion. Sinuses/Orbits: No acute finding. Other: None. IMPRESSION: 1. No acute intracranial abnormality. 2. Chronic microvascular ischemic change and cerebral volume loss. Remote left parietal lobe infarct. Electronically Signed   By: Duanne Guess D.O.   On: 11/16/2022 15:14   DG Chest Port 1 View  Result Date: 11/16/2022 CLINICAL DATA:  Aspiration.  Hypoxia. EXAM: PORTABLE CHEST 1 VIEW COMPARISON:  05/15/2022 FINDINGS: Heart size remains within normal limits. Permanent pacemaker remains in appropriate position. Aortic atherosclerotic calcification incidentally noted. Low lung volumes are again seen bilaterally, with mild bibasilar atelectasis. Lungs are otherwise clear. No evidence of pleural effusion. IMPRESSION: Low lung volumes and mild bibasilar atelectasis. Electronically Signed   By: Danae Orleans M.D.   On: 11/16/2022 13:39       Kathlen Mody M.D. [facility] 11/17/2022, 1:33 PM  Available via Epic secure chat 7am-7pm After 7 pm, please refer to night coverage provider listed on amion.

## 2022-11-17 NOTE — Progress Notes (Addendum)
Daily Progress Note   Patient Name: Kathleen Jensen       Date: 11/17/2022 DOB: 08/22/1950  Age: 72 y.o. MRN#: [id number] Attending Physician: Kathlen Mody, MD Primary Care Physician: Patient, No Pcp Per Admit Date: 11/16/2022  Reason for Consultation/Follow-up: Establishing goals of care  Subjective: I have reviewed medical records including EPIC notes, MAR, and labs. Received report from primary RN - no acute concerns.  Went to visit patient at bedside - no family/visitors present. Patient was lying in bed with her eyes open - she raises her eye brows to voice/gentle touch. Non-verbal gestures discomfort noted to include moaning and weakly shaking her head. No respiratory distress, increased work of breathing, or secretions noted. She remains on non-rebreather; saturations 100% on bedside monitor. She is very weak and frail appearing.  1:23 PM Called daughter/[name] - emotional support provided. Allowed space and time for her to reflect on the events after she arrived to hospital last night. She confirms goal while admitted is DNR/DNI; however, notes if patient "gets better and goes back to nursing home we will change back." She indicates understanding that if patient declines, it would "happen naturally and that is what her body is telling us." Therapeutic listening provided as she reflects on patient's small responses to family presence and touch - she is hopeful this means patient will improve. Confirmed goals are to continue to treat the treatable with watchful waiting; in the event of decline, they would not want escalation of care, but rather transition to full comfort.   We reviewed why patient remains NPO in the setting of aspiration and tube feeds.  All questions and concerns  addressed. Encouraged to call with questions and/or concerns. PMT number provided.  Length of Stay: 1  Current Medications: Scheduled Meds:   acetaminophen  640 mg Per Tube Q8H   amiodarone  200 mg Per Tube Daily   aspirin  81 mg Per Tube QHS   metoprolol tartrate  12.5 mg Per Tube BID   phenytoin  175 mg Per Tube BID   valproic acid  500 mg Per Tube QID    Continuous Infusions:  ampicillin-sulbactam (UNASYN) IV 3 g (11/17/22 1224)   dextrose 5 % and 0.9% NaCl 100 mL/hr at 11/16/22 2106    PRN Meds: HYDROcodone-acetaminophen, metoprolol tartrate  Physical Exam Vitals and  nursing note reviewed.  Constitutional:      General: She is not in acute distress.    Appearance: She is ill-appearing.  Pulmonary:     Effort: No respiratory distress.  Skin:    General: Skin is warm and dry.  Neurological:     Motor: Weakness present.  Psychiatric:        Speech: She is noncommunicative.             Vital Signs: BP (!) 149/87   Pulse (!) 59   Temp 98 F (36.7 C) (Axillary)   Resp 18   SpO2 100%  SpO2: SpO2: 100 % O2 Device: O2 Device: NRB O2 Flow Rate: O2 Flow Rate (L/min): 15 L/min  Intake/output summary:  Intake/Output Summary (Last 24 hours) at 11/17/2022 1250 Last data filed at 11/16/2022 2137 Gross per 24 hour  Intake 100 ml  Output --  Net 100 ml   LBM:   Baseline Weight:   Most recent weight:         Palliative Assessment/Data: PPS 10%      Patient Active Problem List   Diagnosis Date Noted   Acute respiratory failure 11/16/2022   Abdominal distention 11/16/2022   AMS (altered mental status) 05/17/2022   Altered mental status 05/15/2022   Sacral wound 05/15/2022   Intracranial hemorrhage    Pressure injury of skin 02/18/2022   Chronic indwelling Foley catheter 02/18/2022   PAF (paroxysmal atrial fibrillation) 02/18/2022   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/18/2022   Septic shock 02/15/2022   Constipation    Abdominal pain     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   Right hemiplegia 04/12/2020   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   Hyperlipidemia associated with type 2 diabetes mellitus 07/30/2018   History of cerebral infarction 07/30/2018   Vitamin B 12 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/30/2018   Asthma 03/13/2018   Seizures 01/12/2018   CVA (cerebral vascular accident) 12/08/2017   Abnormal thyroid blood test 12/08/2017   Pulmonary embolism and infarction 12/08/2017   Anemia 12/08/2017   Expressive aphasia 10/15/2017   Seizure disorder 10/15/2017   Dysphagia 10/15/2017   Diabetes mellitus without complication    Hyperlipidemia 09/22/2017   Urinary frequency 09/22/2017   Airway hyperreactivity 01/26/2016   Disease of thyroid gland 01/26/2016   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   Other specified postprocedural states 01/26/2016   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   Chronic LBP 04/10/2015   Degeneration of intervertebral disc of lumbar region 04/10/2015   Spondylolisthesis of lumbar region 04/10/2015   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   History of cardiac pacemaker in situ 01/09/2015   B12 DEFICIENCY 08/15/2008   UNSPECIFIED VITAMIN D DEFICIENCY 08/15/2008   SINUS BRADYCARDIA 08/15/2008   OBSTRUCTIVE SLEEP APNEA 08/11/2008   INSOMNIA 04/28/2008   Hyperlipidemia LDL goal <70 12/12/2006   RHINITIS, ALLERGIC NEC 12/12/2006   Hypothyroidism 12/11/2006   Essential hypertension 12/11/2006   GERD 12/11/2006   STRESS INCONTINENCE 12/11/2006    Palliative Care Assessment & Plan   Patient Profile: 72 y.o. female  with past medical history of DM2, HTN, HLD, CVA in 2018 with subsequent right hemiplegia and expressive aphasia with PEG tube and bedbound at baseline, cognitive communication deficit, history of PE, hypothyroidism, seizure disorder, hypothyroidism, anemia, atrial fibrillation with a pacemaker not on AC due to bleeding presented to ED on 11/16/2022 from [facility] with staff concerns of foul-smelling urine, gurgling respirations, hypoxia.   Assessment: Principal Problem:   Acute  respiratory failure Active Problems:   Hypothyroidism   OBSTRUCTIVE SLEEP APNEA   Essential hypertension   STRESS INCONTINENCE   Diabetes mellitus without complication   Expressive aphasia   Seizures   Hyperlipidemia associated with type 2 diabetes mellitus   History of cerebral infarction   Right hemiplegia   PEG (percutaneous endoscopic gastrostomy) status   Abdominal distention   Concern about end of life  Recommendations/Plan: Continue to treat the treatable with watchful waiting. In the event of decline, family would not want care escalated, but rather transition to comfort measures at that time Confirmed DNR/DNI PMT will continue to follow and support holistically  **ADDENDUM** Notified by floor RN that upon transfer from ED patient was "miserable, yelling in pain during transfer to bed and during bath." Morphine  IV ordered q4h PRN severe pain.   Goals of Care and Additional Recommendations: Limitations on Scope of Treatment: Full Scope Treatment and No Tracheostomy  Code Status:    Code Status Orders  (From admission, onward)           Start     Ordered   11/16/22 2215  Do not attempt resuscitation (DNR)  Continuous       Question Answer Comment  If patient has no pulse and is not breathing Do Not Attempt Resuscitation   If patient has a pulse and/or is breathing: Medical Treatment Goals LIMITED ADDITIONAL INTERVENTIONS: Use medication/IV fluids and cardiac monitoring as indicated; Do not use intubation or mechanical ventilation (DNI), also provide comfort medications.  Transfer to Progressive/Stepdown as indicated, avoid Intensive Care.   Consent: Discussion documented in EHR or advanced directives reviewed      11/16/22 2216           Code Status History     Date Active Date Inactive Code Status Order ID Comments User Context    11/16/2022 2042 11/16/2022 2216 DNR [id number]  Rondel Baton, MD ED   11/16/2022 1325 11/16/2022 2042 Full Code [id number]  Rondel Baton, MD ED   05/15/2022 1946 05/18/2022 2321 Full Code [id number]  Tiffany Kocher, DO ED   02/15/2022 1947 02/22/2022 1859 Full Code [id number]  Leslye Peer, MD ED   05/29/2021 1500 06/07/2021 0431 Full Code [id number]  Orland Mustard, MD ED   04/12/2020 0114 04/14/2020 1847 Full Code [id number]  Hillary Bow, DO ED   10/05/2017 1544 10/15/2017 1619 Full Code [id number]  Black, Lesle Chris, NP ED       Prognosis:  Poor   Discharge Planning: To Be Determined  Care plan was discussed with RN, patient's daughter/HCPOA, Dr. Blake Divine  Thank you for allowing the Palliative Medicine Team to assist in the care of this patient.  Haskel Khan, NP  Please contact Palliative Medicine Team phone at [phone] for questions and concerns.   *Portions of this note are a verbal dictation therefore any spelling and/or grammatical errors are due to the "[employer]" system interpretation.

## 2022-11-17 NOTE — Consult Note (Signed)
WOC Nurse Consult Note: Reason for Consult:Unstageable pressure injury to sacrum noted upon admission in critically ill patient with multiple comorbid conditions  Wound type:pressure vs SCALE (Skin Changes at Life's End) Pressure Injury POA: Yes Measurement:To be measured by Bedside RN with application of next dressing to affected sacral area today and documented on Nursing Flow Sheet Wound bed: Nonviable tissue obscures wound bed (slough) Dressing procedure/placement/frequency: I have provided Nursing with guidance in the care of this lesion using turning and repositioning to minimize time in the supine position, floatation of heels to prevent heel PI using Prevalon boots, daily and PRN topical care using MediHoney (leptospermum Manuka Honey) as an antimicrobial wound gel to the sacrum after cleansing and topping with dry gauze prior to securing with a silicone foam for the sacrum.  WOC nursing team will not follow, but will remain available to this patient, the nursing and medical teams.  Please re-consult if needed.  Thank you for inviting [place] to participate in this patient's Plan of Care.  Ladona Mow, MSN, RN, CNS, GNP, Leda Min, [employer], [employer] phone:  [phone]

## 2022-11-17 NOTE — ED Notes (Signed)
ED TO INPATIENT HANDOFF REPORT  ED Nurse Name and Phone #: Leeroy Bock [phone]  S Name/Age/Gender Kathleen Jensen 72 y.o. female Room/Bed: 006C/006C  Code Status   Code Status: DNR  Home/SNF/Other Skilled nursing facility Patient oriented to: None Is this baseline? Yes   Triage Complete: Triage complete  Chief Complaint Acute respiratory failure [J96.00]  Triage Note Pt BIB EMS from [facility], per EMS staff states to their knowledge the patient was fine last night, and this morning, around lunch time noticed she was gurgling, low O2, and weeping, with a foul smell and discoloration to her urine.  Patient came in with an indwelling, per EMS patient did not speak but moaned, staff at the facility states patient normally does not speak but they were not very familiar with her.    163/89 HR 67 95 % 12L CBG 100   Allergies Allergies  Allergen Reactions   Tape Rash   Demerol [Meperidine Hcl] Nausea And Vomiting   Sulfonamide Derivatives Itching   Bacitracin-Polymyxin B Dermatitis   Oxycontin [Oxycodone] Itching    Level of Care/Admitting Diagnosis ED Disposition     ED Disposition  Admit   Condition  --   Comment  Hospital Area: [facility] [[id number]]  Level of Care: Progressive [102]  Admit to Progressive based on following criteria: RESPIRATORY PROBLEMS hypoxemic/hypercapnic respiratory failure that is responsive to NIPPV (BiPAP) or High Flow Nasal Cannula (6-80 lpm). Frequent assessment/intervention, no > Q2 hrs < Q4 hrs, to maintain oxygenation and pulmonary hygiene.  May admit patient to [facility] or [facility] if equivalent level of care is available:: Yes  Covid Evaluation: Asymptomatic - no recent exposure (last 10 days) testing not required  Diagnosis: Acute respiratory failure [518.81.ICD-9-CM]  Admitting Physician: Samara Snide  Attending Physician: Samara Snide  Certification:: I certify this patient  will need inpatient services for at least 2 midnights  Estimated Length of Stay: 3          B Medical/Surgery History 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 gastric bypass   GERD (gastroesophageal reflux disease)    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    Hyperlipemia    Hypertension    Insomnia    Intracerebral hemorrhage, intraventricular    Muscle weakness    Other abnormalities of gait and mobility    Seizures    Stroke    Unsteadiness on feet    Vitamin B deficiency    Past Surgical History:  Procedure Laterality Date   BACK SURGERY     Dec 18, 2016 Dr.Torrealba   BREAST BIOPSY Right    BREAST EXCISIONAL BIOPSY     CERVICAL SPINE SURGERY     C4   CESAREAN SECTION     GASTRIC BYPASS  03/2007   TONSILLECTOMY       A IV Location/Drains/Wounds Patient Lines/Drains/Airways Status     Active Line/Drains/Airways     Name Placement date Placement time Site Days   Peripheral IV 11/16/22 20 G Left Antecubital 11/16/22  1430  Antecubital  1   Gastrostomy/Enterostomy PEG-jejunostomy LUQ --  --  LUQ  --   Urethral Catheter Gi  RN Double-lumen 14 Fr. 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 02/15/22  2200  -- 275            Intake/Output Last 24 hours  Intake/Output Summary (Last 24 hours) at 11/17/2022 1216 Last data filed at 11/16/2022 2137 Gross per 24 hour  Intake 100 ml  Output --  Net 100 ml    Labs/Imaging Results for orders placed or performed during the hospital  encounter of 11/16/22 (from the past 48 hour(s))  Ammonia     Status: Abnormal   Collection Time: 11/16/22  2:11 PM  Result Value Ref Range   Ammonia 43 (H) 9 - 35 umol/L    Comment: Performed at [facility], [address] [place], [place] [address]  Comprehensive metabolic panel     Status: Abnormal   Collection Time: 11/16/22  2:11 PM  Result Value Ref Range   Sodium 129 (L) 135 - 145 mmol/L   Potassium 4.6 3.5 - 5.1 mmol/L   Chloride 90 (L) 98 - 111 mmol/L   CO2 31 22 - 32 mmol/L   Glucose, Bld 75 70 - 99 mg/dL    Comment: Glucose reference range applies only to samples taken after fasting for at least 8 hours.   BUN 20 8 - 23 mg/dL   Creatinine, Ser 6.04 0.44 - 1.00 mg/dL   Calcium 8.1 (L) 8.9 - 10.3 mg/dL   Total Protein 8.1 6.5 - 8.1 g/dL   Albumin 1.9 (L) 3.5 - 5.0 g/dL   AST 48 (H) 15 - 41 U/L   ALT 19 0 - 44 U/L   Alkaline Phosphatase 134 (H) 38 - 126 U/L   Total Bilirubin 0.5 0.3 - 1.2 mg/dL   GFR, Estimated >54 >09 mL/min    Comment: (NOTE) Calculated using the CKD-EPI Creatinine Equation (2021)    Anion gap 8 5 - 15    Comment: Performed at [facility], [address], [place], [place] [address]  Ethanol     Status: None   Collection Time: 11/16/22  2:11 PM  Result Value Ref Range   Alcohol, Ethyl (B) <10 <10 mg/dL    Comment: (NOTE) Lowest detectable limit for serum alcohol is 10 mg/dL.  For medical purposes only. Performed at [facility], [address], [place], [place] [address]   CBC with Differential/Platelet     Status: Abnormal   Collection Time: 11/16/22  2:11 PM  Result Value Ref Range   WBC 10.3 4.0 - 10.5 K/uL   RBC 4.02 3.87 - 5.11 MIL/uL   Hemoglobin 12.5 12.0 - 15.0 g/dL   HCT 56.2 13.0 - 86.5 %   MCV 92.5 80.0 - 100.0 fL   MCH 31.1 26.0 - 34.0 pg   MCHC 33.6 30.0 - 36.0 g/dL   RDW 78.4 69.6 - 29.5 %   Platelets 202 150 - 400 K/uL   nRBC 0.0 0.0 - 0.2 %   Neutrophils Relative % 54 %   Neutro Abs 5.6 1.7 - 7.7  K/uL   Lymphocytes Relative 33 %   Lymphs Abs 3.4 0.7 - 4.0 K/uL   Monocytes Relative 11 %   Monocytes Absolute 1.1 (H) 0.1 - 1.0 K/uL   Eosinophils Relative 1 %  Eosinophils Absolute 0.1 0.0 - 0.5 K/uL   Basophils Relative 1 %   Basophils Absolute 0.1 0.0 - 0.1 K/uL   Immature Granulocytes 0 %   Abs Immature Granulocytes 0.03 0.00 - 0.07 K/uL    Comment: Performed at [facility], [address], [place], [place] [address]  Valproic acid level     Status: None   Collection Time: 11/16/22  2:11 PM  Result Value Ref Range   Valproic Acid Lvl 53 50.0 - 100.0 ug/mL    Comment: Performed at [facility], [address] [place], [place] [address]  Resp panel by RT-PCR (RSV, Flu A&B, Covid) Anterior Nasal Swab     Status: None   Collection Time: 11/16/22  2:11 PM   Specimen: Anterior Nasal Swab  Result Value Ref Range   SARS Coronavirus 2 by RT PCR NEGATIVE NEGATIVE   Influenza A by PCR NEGATIVE NEGATIVE   Influenza B by PCR NEGATIVE NEGATIVE    Comment: (NOTE) The Xpert Xpress SARS-CoV-2/FLU/RSV plus assay is intended as an aid in the diagnosis of influenza from Nasopharyngeal swab specimens and should not be used as a sole basis for treatment. Nasal washings and aspirates are unacceptable for Xpert Xpress SARS-CoV-2/FLU/RSV testing.  Fact Sheet for Patients: BloggerCourse.com  Fact Sheet for Healthcare Providers: SeriousBroker.it  This test is not yet approved or cleared by the [place] FDA and has been authorized for detection and/or diagnosis of SARS-CoV-2 by FDA under an Emergency Use Authorization (EUA). This EUA will remain in effect (meaning this test can be used) for the duration of the COVID-19 declaration under Section 564(b)(1) of the Act, 21 U.S.C. section 360bbb-3(b)(1), unless the authorization is terminated or revoked.     Resp Syncytial Virus by PCR NEGATIVE NEGATIVE    Comment: (NOTE) Fact  Sheet for Patients: BloggerCourse.com  Fact Sheet for Healthcare Providers: SeriousBroker.it  This test is not yet approved or cleared by the [place] FDA and has been authorized for detection and/or diagnosis of SARS-CoV-2 by FDA under an Emergency Use Authorization (EUA). This EUA will remain in effect (meaning this test can be used) for the duration of the COVID-19 declaration under Section 564(b)(1) of the Act, 21 U.S.C. section 360bbb-3(b)(1), unless the authorization is terminated or revoked.  Performed at [facility], [address], [place], [place] [address]   I-Stat arterial blood gas, ED     Status: Abnormal   Collection Time: 11/16/22  2:18 PM  Result Value Ref Range   pH, Arterial 7.414 7.35 - 7.45   pCO2 arterial 56.4 (H) 32 - 48 mmHg   pO2, Arterial 335 (H) 83 - 108 mmHg   Bicarbonate 36.1 (H) 20.0 - 28.0 mmol/L   TCO2 38 (H) 22 - 32 mmol/L   O2 Saturation 100 %   Acid-Base Excess 9.0 (H) 0.0 - 2.0 mmol/L   Sodium 131 (L) 135 - 145 mmol/L   Potassium 4.7 3.5 - 5.1 mmol/L   Calcium, Ion 1.16 1.15 - 1.40 mmol/L   HCT 39.0 36.0 - 46.0 %   Hemoglobin 13.3 12.0 - 15.0 g/dL   Patient temperature 24.4 F    Collection site RADIAL, ALLEN'S TEST ACCEPTABLE    Drawn by RT    Sample type ARTERIAL   CBG monitoring, ED     Status: Abnormal   Collection Time: 11/16/22  3:15 PM  Result Value Ref Range   Glucose-Capillary 53 (L) 70 - 99 mg/dL  Comment: Glucose reference range applies only to samples taken after fasting for at least 8 hours.  CBG monitoring, ED     Status: Abnormal   Collection Time: 11/16/22  4:04 PM  Result Value Ref Range   Glucose-Capillary 111 (H) 70 - 99 mg/dL    Comment: Glucose reference range applies only to samples taken after fasting for at least 8 hours.  POC CBG, ED     Status: Abnormal   Collection Time: 11/16/22  8:46 PM  Result Value Ref Range   Glucose-Capillary 68 (L) 70 -  99 mg/dL    Comment: Glucose reference range applies only to samples taken after fasting for at least 8 hours.  Rapid urine drug screen (hospital performed)     Status: None   Collection Time: 11/16/22 10:23 PM  Result Value Ref Range   Opiates NONE DETECTED NONE DETECTED   Cocaine NONE DETECTED NONE DETECTED   Benzodiazepines NONE DETECTED NONE DETECTED   Amphetamines NONE DETECTED NONE DETECTED   Tetrahydrocannabinol NONE DETECTED NONE DETECTED   Barbiturates NONE DETECTED NONE DETECTED    Comment: (NOTE) DRUG SCREEN FOR MEDICAL PURPOSES ONLY.  IF CONFIRMATION IS NEEDED FOR ANY PURPOSE, NOTIFY LAB WITHIN 5 DAYS.  LOWEST DETECTABLE LIMITS FOR URINE DRUG SCREEN Drug Class                     Cutoff (ng/mL) Amphetamine and metabolites    1000 Barbiturate and metabolites    200 Benzodiazepine                 200 Opiates and metabolites        300 Cocaine and metabolites        300 THC                            50 Performed at [facility], [address], [place], [place] [address]   Urinalysis, Routine w reflex microscopic -Urine, Catheterized; In and out catheter     Status: Abnormal   Collection Time: 11/16/22 10:24 PM  Result Value Ref Range   Color, Urine AMBER (A) YELLOW    Comment: BIOCHEMICALS MAY BE AFFECTED BY COLOR   APPearance CLOUDY (A) CLEAR   Specific Gravity, Urine 1.025 1.005 - 1.030   pH 5.0 5.0 - 8.0   Glucose, UA 150 (A) NEGATIVE mg/dL   Hgb urine dipstick MODERATE (A) NEGATIVE   Bilirubin Urine NEGATIVE NEGATIVE   Ketones, ur NEGATIVE NEGATIVE mg/dL   Protein, ur NEGATIVE NEGATIVE mg/dL   Nitrite NEGATIVE NEGATIVE   Leukocytes,Ua LARGE (A) NEGATIVE   RBC / HPF >50 0 - 5 RBC/hpf   WBC, UA >50 0 - 5 WBC/hpf   Bacteria, UA FEW (A) NONE SEEN   Squamous Epithelial / HPF 6-10 0 - 5 /HPF   WBC Clumps PRESENT    Non Squamous Epithelial 0-5 (A) NONE SEEN    Comment: Performed at [facility], [address] [place], [place] [address]  CBG  monitoring, ED     Status: Abnormal   Collection Time: 11/17/22  2:12 AM  Result Value Ref Range   Glucose-Capillary 135 (H) 70 - 99 mg/dL    Comment: Glucose reference range applies only to samples taken after fasting for at least 8 hours.   DG Abd Portable 1V  Result Date: 11/16/2022 CLINICAL DATA:  Abdominal distension. EXAM: PORTABLE ABDOMEN - 1 VIEW COMPARISON:  May 16, 2022 FINDINGS: Numerous distended, air-filled loops of bowel are seen throughout the abdomen. No radio-opaque calculi or other significant radiographic abnormality are seen. Radiopaque surgical clips are seen within the right upper quadrant. Postoperative changes are noted within the lower lumbar spine. IMPRESSION: Findings consistent with a small-bowel obstruction versus ileus. Electronically Signed   By: Aram Candela M.D.   On: 11/16/2022 22:38   CT HEAD WO CONTRAST  Result Date: 11/16/2022 CLINICAL DATA:  Mental status change, unknown cause EXAM: CT HEAD WITHOUT CONTRAST TECHNIQUE: Contiguous axial images were obtained from the base of the skull through the vertex without intravenous contrast. RADIATION DOSE REDUCTION: This exam was performed according to the departmental dose-optimization program which includes automated exposure control, adjustment of the mA and/or kV according to patient size and/or use of iterative reconstruction technique. COMPARISON:  05/15/2022 FINDINGS: Brain: No evidence of acute infarction, hemorrhage, hydrocephalus, extra-axial collection or mass lesion/mass effect. Remote left parietal lobe infarct. Patchy low-density changes within the periventricular and subcortical white matter most compatible with chronic microvascular ischemic change. Moderate diffuse cerebral volume loss. Vascular: Atherosclerotic calcifications involving the large vessels of the skull base. No unexpected hyperdense vessel. Skull: Normal. Negative for fracture or focal lesion. Sinuses/Orbits: No acute finding. Other:  None. IMPRESSION: 1. No acute intracranial abnormality. 2. Chronic microvascular ischemic change and cerebral volume loss. Remote left parietal lobe infarct. Electronically Signed   By: Duanne Guess D.O.   On: 11/16/2022 15:14   DG Chest Port 1 View  Result Date: 11/16/2022 CLINICAL DATA:  Aspiration.  Hypoxia. EXAM: PORTABLE CHEST 1 VIEW COMPARISON:  05/15/2022 FINDINGS: Heart size remains within normal limits. Permanent pacemaker remains in appropriate position. Aortic atherosclerotic calcification incidentally noted. Low lung volumes are again seen bilaterally, with mild bibasilar atelectasis. Lungs are otherwise clear. No evidence of pleural effusion. IMPRESSION: Low lung volumes and mild bibasilar atelectasis. Electronically Signed   By: Danae Orleans M.D.   On: 11/16/2022 13:39    Pending Labs Unresulted Labs (From admission, onward)     Start     Ordered   11/16/22 2216  TSH  Add-on,   AD        11/16/22 2216   11/16/22 1300  Phenytoin level, free and total  Once,   STAT        11/16/22 1302            Vitals/Pain Today's Vitals   11/17/22 0630 11/17/22 0700 11/17/22 1000 11/17/22 1203  BP: (!) 145/84 (!) 172/138 (!) 149/87   Pulse: (!) 59 (!) 59 (!) 59   Resp: 18 18 18    Temp:    98 F (36.7 C)  TempSrc:    Axillary  SpO2: 98% 100% 100%     Isolation Precautions No active isolations  Medications Medications  dextrose 5 %-0.9 % sodium chloride infusion ( Intravenous New Bag/Given 11/16/22 2106)  acetaminophen (TYLENOL) 160 MG/5ML solution 640 mg (640 mg Per Tube Not Given 11/17/22 0600)  aspirin chewable tablet 81 mg (81 mg Per Tube Given 11/17/22 0051)  HYDROcodone-acetaminophen (NORCO/VICODIN) 5-325 MG per tablet 1 tablet (has no administration in time range)  amiodarone (PACERONE) tablet 200 mg (0 mg Per Tube Hold 11/17/22 1000)  metoprolol tartrate (LOPRESSOR) tablet 12.5 mg (0 mg Per Tube Hold 11/17/22 1000)  valproic acid (DEPAKENE) 250 MG/5ML solution 500 mg (0  mg Per Tube Hold 11/17/22 1000)  phenytoin (DILANTIN) 125 MG/5ML suspension 175 mg (0 mg Per Tube Hold 11/17/22 1000)  metoprolol tartrate (  LOPRESSOR) injection 5 mg (has no administration in time range)  Ampicillin-Sulbactam (UNASYN) 3 g in sodium chloride 0.9 % 100 mL IVPB (has no administration in time range)  dextrose 50 % solution 50 mL (50 mLs Intravenous Given 11/16/22 1525)  Ampicillin-Sulbactam (UNASYN) 3 g in sodium chloride 0.9 % 100 mL IVPB (0 g Intravenous Stopped 11/16/22 2137)  dextrose 50 % solution 50 mL (50 mLs Intravenous Given 11/16/22 2107)    Mobility non-ambulatory     Focused Assessments Pulmonary Assessment Handoff:  Lung sounds:   O2 Device: NRB O2 Flow Rate (L/min): 15 L/min    R Recommendations: See Admitting Provider Note  Report given to:   Additional Notes: Pt has J Tube for medications. Has been on 15L NRB to maintain sats. Patient changed to DNR status yesterday, palliative care has been consulted and talked to family. MD unable to get ahold of family today as of 1200 to discuss their decision to move patient to palliative/comfort care so moving forward with meds/care as normal at this time.

## 2022-11-17 NOTE — Progress Notes (Signed)
Pharmacy Antibiotic Note  Kathleen Jensen is a 72 y.o. female admitted on 11/16/2022 with  aspiration pneumonia .  Pharmacy has been consulted for Unasyn dosing. First dose given 4/20 at 2107 PM.   WBC within normal limits. Afebrile.   Plan: Unasyn 3g IV every 6 hours. Monitor clinical status, culture results, and renal function.      Temp (24hrs), Avg:98.5 F (36.9 C), Min:98.3 F (36.8 C), Max:98.7 F (37.1 C)  Recent Labs  Lab 11/16/22 1411  WBC 10.3  CREATININE 0.44    CrCl cannot be calculated (Unknown ideal weight.).    Allergies  Allergen Reactions   Tape Rash   Demerol [Meperidine Hcl] Nausea And Vomiting   Sulfonamide Derivatives Itching   Bacitracin-Polymyxin B Dermatitis   Oxycontin [Oxycodone] Itching    Antimicrobials this admission: Unasyn 4/20 >>  Dose adjustments this admission:  Microbiology results: pending  Thank you for allowing pharmacy to be a part of this patient's care.  Link Snuffer, PharmD, BCPS, BCCCP Clinical Pharmacist Please refer to [facility] for [facility] numbers 11/17/2022 10:21 AM

## 2022-11-17 NOTE — ED Notes (Signed)
MD unable to reach family at this time to confirm plan of care, continue progressive status with patient

## 2022-11-17 NOTE — ED Notes (Signed)
MD to contact family for plan of care

## 2022-11-18 ENCOUNTER — Inpatient Hospital Stay (HOSPITAL_COMMUNITY): Payer: Medicare HMO

## 2022-11-18 DIAGNOSIS — R569 Unspecified convulsions: Secondary | ICD-10-CM | POA: Diagnosis not present

## 2022-11-18 DIAGNOSIS — Z8673 Personal history of transient ischemic attack (TIA), and cerebral infarction without residual deficits: Secondary | ICD-10-CM | POA: Diagnosis not present

## 2022-11-18 DIAGNOSIS — J9601 Acute respiratory failure with hypoxia: Secondary | ICD-10-CM | POA: Diagnosis not present

## 2022-11-18 DIAGNOSIS — Z931 Gastrostomy status: Secondary | ICD-10-CM | POA: Diagnosis not present

## 2022-11-18 LAB — BASIC METABOLIC PANEL
Anion gap: 8 (ref 5–15)
BUN: 5 mg/dL — ABNORMAL LOW (ref 8–23)
CO2: 31 mmol/L (ref 22–32)
Calcium: 8 mg/dL — ABNORMAL LOW (ref 8.9–10.3)
Chloride: 95 mmol/L — ABNORMAL LOW (ref 98–111)
Creatinine, Ser: 0.37 mg/dL — ABNORMAL LOW (ref 0.44–1.00)
GFR, Estimated: >60 mL/min (ref >60)
Glucose, Bld: 108 mg/dL — ABNORMAL HIGH (ref 70–99)
Potassium: 3.2 mmol/L — ABNORMAL LOW (ref 3.5–5.1)
Sodium: 134 mmol/L — ABNORMAL LOW (ref 135–145)

## 2022-11-18 LAB — CBC WITH DIFFERENTIAL/PLATELET
Abs Immature Granulocytes: 0.01 10*3/uL (ref 0.00–0.07)
Basophils Absolute: 0 10*3/uL (ref 0.0–0.1)
Basophils Relative: 0 %
Eosinophils Absolute: 0.1 10*3/uL (ref 0.0–0.5)
Eosinophils Relative: 2 %
HCT: 39 % (ref 36.0–46.0)
Hemoglobin: 12.9 g/dL (ref 12.0–15.0)
Immature Granulocytes: 0 %
Lymphocytes Relative: 39 %
Lymphs Abs: 2.3 10*3/uL (ref 0.7–4.0)
MCH: 31.2 pg (ref 26.0–34.0)
MCHC: 33.1 g/dL (ref 30.0–36.0)
MCV: 94.2 fL (ref 80.0–100.0)
Monocytes Absolute: 0.7 10*3/uL (ref 0.1–1.0)
Monocytes Relative: 11 %
Neutro Abs: 2.8 10*3/uL (ref 1.7–7.7)
Neutrophils Relative %: 48 %
Platelets: 239 10*3/uL (ref 150–400)
RBC: 4.14 MIL/uL (ref 3.87–5.11)
RDW: 14.7 % (ref 11.5–15.5)
WBC: 6 10*3/uL (ref 4.0–10.5)
nRBC: 0 % (ref 0.0–0.2)

## 2022-11-18 MED ORDER — CHLORHEXIDINE GLUCONATE CLOTH 2 % EX PADS
6.0000 | MEDICATED_PAD | Freq: Every day | CUTANEOUS | Status: DC
  Administered 2022-11-17 – 2022-12-03 (×17): 6 via TOPICAL

## 2022-11-18 NOTE — Social Work (Addendum)
CSW spoke with [facility] and confirmed pt is Long Term Care with them and active with [facility]. No barriers to returning. Pt's current status discussed with facility. They stated pt could return to prior level of care or with hospice/ palliative services, whichever is appropriate. At this time family is opting for a watchful waiting period and working with Palliative to determine next steps. TOC is available and will continue to follow, please consult for any identified needs.

## 2022-11-18 NOTE — TOC Initial Note (Signed)
Transition of Care [facility]) - Initial/Assessment Note    Patient Details  Name: Kathleen Jensen MRN: [id number] Date of Birth: 1951-04-25  Transition of Care [facility] CM/SW Contact:    Harriet Masson, RN Phone Number: 11/18/2022, 9:44 AM  Clinical Narrative:                 Patient admitted from [facility] place with acute respiratory failure.  Palliative care is consulted.  TOC following.   Expected Discharge Plan: Long Term Nursing Home Barriers to Discharge: Continued Medical Work up   Patient Goals and CMS Choice            Expected Discharge Plan and Services     Post Acute Care Choice: Skilled Nursing Facility Living arrangements for the past 2 months: Skilled Nursing Facility                                      Prior Living Arrangements/Services Living arrangements for the past 2 months: Skilled Nursing Facility                     Activities of Daily Living      Permission Sought/Granted                  Emotional Assessment              Admission diagnosis:  Acute respiratory failure [J96.00] Stupor [R40.1] Hypoxia [R09.02] History of stroke [Z86.73] Aspiration into airway, initial encounter [T17.908A] Patient Active Problem List   Diagnosis Date Noted   Acute respiratory failure 11/16/2022   Abdominal distention 11/16/2022   AMS (altered mental status) 05/17/2022   Altered mental status 05/15/2022   Sacral wound 05/15/2022   Intracranial hemorrhage    Pressure injury of skin 02/18/2022   Chronic indwelling Foley catheter 02/18/2022   PAF (paroxysmal atrial fibrillation) 02/18/2022   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/18/2022   Septic shock 02/15/2022   Constipation    Abdominal pain    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   Right hemiplegia 04/12/2020   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   Hyperlipidemia associated with type 2 diabetes mellitus 07/30/2018    History of cerebral infarction 07/30/2018   Vitamin B 12 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/30/2018   Asthma 03/13/2018   Seizures 01/12/2018   CVA (cerebral vascular accident) 12/08/2017   Abnormal thyroid blood test 12/08/2017   Pulmonary embolism and infarction 12/08/2017   Anemia 12/08/2017   Expressive aphasia 10/15/2017   Seizure disorder 10/15/2017   Dysphagia 10/15/2017   Diabetes mellitus without complication    Hyperlipidemia 09/22/2017   Urinary frequency 09/22/2017   Airway hyperreactivity 01/26/2016   Disease of thyroid gland 01/26/2016   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   Other specified postprocedural states 01/26/2016   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   Chronic LBP 04/10/2015   Degeneration of intervertebral disc of lumbar region 04/10/2015   Spondylolisthesis of lumbar region 04/10/2015   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   History of cardiac pacemaker in situ 01/09/2015   B12 DEFICIENCY 08/15/2008   UNSPECIFIED VITAMIN D DEFICIENCY 08/15/2008   SINUS BRADYCARDIA 08/15/2008   OBSTRUCTIVE SLEEP APNEA 08/11/2008   INSOMNIA 04/28/2008   Hyperlipidemia LDL goal <70 12/12/2006   RHINITIS, ALLERGIC NEC 12/12/2006   Hypothyroidism 12/11/2006   Essential hypertension 12/11/2006   GERD 12/11/2006   STRESS INCONTINENCE 12/11/2006   PCP:  Patient,  No Pcp Per Pharmacy:   [facility] - [place], [place] - [address] [place] [address] Phone: [phone] Fax: [phone]  [facility] [address] [place] [address] Phone: [phone] Fax: [phone]  [facility] [address] [place] [address] Phone: [phone] Fax: [phone]     Social Determinants of Health (SDOH) Social History: SDOH Screenings   Food Insecurity: No Food Insecurity (05/16/2022)  Housing: Low Risk  (05/17/2022)  Transportation Needs:  No Transportation Needs (05/17/2022)  Utilities: Not At Risk (05/17/2022)  Depression (PHQ2-9): Low Risk  (05/22/2019)  Tobacco Use: Medium Risk (11/16/2022)   SDOH Interventions:     Readmission Risk Interventions    02/18/2022    2:23 PM  Readmission Risk Prevention Plan  Transportation Screening Complete  PCP or Specialist Appt within 5-7 Days Complete  Home Care Screening Complete  Medication Review (RN CM) Complete

## 2022-11-18 NOTE — Progress Notes (Signed)
   Medical records reviewed including progress notes, labs, imaging.  Per previous conversations with PMT, goals of care at this time are to allow time for outcomes and treat the treatable.  Patient's family would be open to further discussion surrounding comfort care if patient does not improve.  PMT will follow-up with family tomorrow 4/23 for ongoing goals of care discussions.  Family has been encouraged to reach out with additional needs.  Thank you for your referral and allowing PMT to assist in [name]'s care.   Richardson Dopp, [facility] Palliative Medicine Team  Team Phone # [phone]   NO CHARGE

## 2022-11-18 NOTE — Progress Notes (Signed)
[facility]                                                                               Taylen Wendland, is a 72 y.o. 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  She has a history of A-fib, pacemaker in place, seizures, HTN, DM, hypothyroidism, anemia. She has a PEG tube in place for approximately the last year due to poor p.o. intake.  She has had a stepwise decline over the last year following sepsis from UTI and other infectious processes.  Patient has not indwelling Foley as well.  Patient resides at [facility] usually and has been unable to talk for the last 4 months.  She presents to the ED with possible aspiration.  Patient was noted to have gurgling on respirations that came in place this morning and foul-smelling urine.  EMS brought her to the hospital she required nonrebreather.  She was then weaned to 3 L via nasal cannula and then aspirated some more while in the ED despite not having any thing by mouth or any tube feeds going and advance again back to nonrebreather to support her respirations.  In the ED palliative care came and saw the patient had a long lengthy conversation with the family.  Given her worsening course the family decided to make her DNR/DNI but still wanted aggressive medical treatment.   Assessment & Plan    Assessment and Plan: * Acute respiratory failure with hypoxia currently on 15 lit of [place] oxygen.  Thought to be secondary to aspiration pneumonitis  On Unasyn, to continue.  Long discussion with palliative and the EDP around goals of care for this patient.  The family finally decided to make her DNR/DNI however they continue to want aggressive treatment with things like antibiotics.  Palliative care  consulted for goals of care discussions, plan to continue with treatment with watchful waiting, and no escalation of care if she declines and possible transition to comfort measure if she continues to decline.  She continues to required 15 lit of [place] oxygen.    Seizures Continue Depakote and Dilantin  Abdominal distention Patient with history of Roux-en-Y ABD X ray showing SBO vs ileus.  No feeds for now.    History of cerebral infarction With significant ongoing issues Patient has a tube feeding but we are holding this due to her aspiration pneumonia/ SBO. Expressive aphasia, pt totally unresponsive at this time.   Expressive aphasia Currently nonverbal Significant risk of aspiration  Diabetes mellitus without complication CBG (last 3)  Recent Labs    11/16/22 1604 11/16/22 2046 11/17/22 0212  GLUCAP 111* 68* 135*   Tube feeds on hold for aspiration pneumonia.  Currently on dextrose fluids.   Essential hypertension Well controlled.   Hypothyroidism TSH  Has been off her meds recently    Hyponatremia  Sodium improved from 129 to 131. RECHECK LABS today.  Estimated body mass index is 30.08 kg/m as calculated from the following:   Height as of 05/16/22:  (1.651 m).   Weight as of 05/16/22: 82 kg.  Code Status: DNR DVT Prophylaxis:  SCDs Start: 11/16/22 2214   Level of Care: Level of care: Progressive Family Communication: none at bedside.   Disposition Plan:     Remains inpatient appropriate:  watchful observation on IV antibiotics.   Procedures:  None.   Consultants:   PALLIATIVE CARE.   Antimicrobials:   Anti-infectives (From admission, onward)    Start     Dose/Rate Route Frequency Ordered Stop   11/17/22 1030  Ampicillin-Sulbactam (UNASYN) 3 g in sodium chloride 0.9 % 100 mL IVPB        3 g 200 mL/hr over 30 Minutes Intravenous Every 6 hours 11/17/22 1035     11/16/22 2045  Ampicillin-Sulbactam (UNASYN) 3 g in sodium chloride 0.9 % 100  mL IVPB        3 g 200 mL/hr over 30 Minutes Intravenous  Once 11/16/22 2043 11/16/22 2137        Medications  Scheduled Meds:  acetaminophen  640 mg Per Tube Q8H   amiodarone  200 mg Per Tube Daily   aspirin  81 mg Per Tube QHS   Chlorhexidine Gluconate Cloth  6 each Topical Daily   leptospermum manuka honey  1 Application Topical Daily   metoprolol tartrate  12.5 mg Per Tube BID   mouth rinse  15 mL Mouth Rinse 4 times per day   phenytoin  175 mg Per Tube BID   valproic acid  500 mg Per Tube QID   Continuous Infusions:  ampicillin-sulbactam (UNASYN) IV 3 g (11/18/22 0540)   dextrose 5 % and 0.9% NaCl 100 mL/hr at 11/18/22 0100   PRN Meds:.HYDROcodone-acetaminophen, metoprolol tartrate, morphine injection, mouth rinse    Subjective:   Alicia Longton was seen and examined today.  No change in mental status. . She is still moaning.   Objective:   Vitals:   11/18/22 0300 11/18/22 0400 11/18/22 0500 11/18/22 0600  BP: (!) 145/79 (!) 140/78 (!) 151/87 (!) 144/75  Pulse: 60 60 60 60  Resp: (!) 7  Temp: (!) 97.5 F (36.4 C)     TempSrc: Axillary     SpO2: 100% 100% 100% 100%    Intake/Output Summary (Last 24 hours) at 11/18/2022 0854 Last data filed at 11/18/2022 0600 Gross per 24 hour  Intake 2300 ml  Output 2350 ml  Net -50 ml    There were no vitals filed for this visit.   Exam General exam:ill appearing gentleman, on 15 lit of [place] oxygen.  Respiratory system: diminished at bases.  Cardiovascular system: S1 & S2 heard, RRR.  Gastrointestinal system: Abdomen is soft, mildly distended.  Central nervous system: not following commands. Alert. Non verbal.  Extremities: Symmetric 5 x 5 power. Skin: No rashes, lesions or ulcers Psychiatry:unable to assess      Data Reviewed:  I have personally reviewed following labs and imaging studies   CBC Lab Results  Component Value Date   WBC 10.3 11/16/2022   RBC 4.02 11/16/2022   HGB 13.3 11/16/2022    HCT 39.0 11/16/2022   MCV 92.5 11/16/2022   MCH 31.1 11/16/2022   PLT 202 11/16/2022   MCHC 33.6 11/16/2022   RDW 15.0 11/16/2022   LYMPHSABS 3.4 11/16/2022   MONOABS 1.1 (H) 11/16/2022   EOSABS 0.1 11/16/2022   BASOSABS 0.1  11/16/2022     Last metabolic panel Lab Results  Component Value Date   NA 131 (L) 11/16/2022   K 4.7 11/16/2022   CL 90 (L) 11/16/2022   CO2 31 11/16/2022   BUN 20 11/16/2022   CREATININE 0.44 11/16/2022   GLUCOSE 75 11/16/2022   GFRNONAA >60 11/16/2022   GFRAA >60 04/14/2020   CALCIUM 8.1 (L) 11/16/2022   PHOS 3.4 05/17/2022   PROT 8.1 11/16/2022   ALBUMIN 1.9 (L) 11/16/2022   BILITOT 0.5 11/16/2022   ALKPHOS 134 (H) 11/16/2022   AST 48 (H) 11/16/2022   ALT 19 11/16/2022   ANIONGAP 8 11/16/2022    CBG (last 3)  Recent Labs    11/16/22 1604 11/16/22 2046 11/17/22 0212  GLUCAP 111* 68* 135*       Coagulation Profile: No results for input(s): "INR", "PROTIME" in the last 168 hours.   Radiology Studies: DG Abd Portable 1V  Result Date: 11/16/2022 CLINICAL DATA:  Abdominal distension. EXAM: PORTABLE ABDOMEN - 1 VIEW COMPARISON:  May 16, 2022 FINDINGS: Numerous distended, air-filled loops of bowel are seen throughout the abdomen. No radio-opaque calculi or other significant radiographic abnormality are seen. Radiopaque surgical clips are seen within the right upper quadrant. Postoperative changes are noted within the lower lumbar spine. IMPRESSION: Findings consistent with a small-bowel obstruction versus ileus. Electronically Signed   By: Aram Candela M.D.   On: 11/16/2022 22:38   CT HEAD WO CONTRAST  Result Date: 11/16/2022 CLINICAL DATA:  Mental status change, unknown cause EXAM: CT HEAD WITHOUT CONTRAST TECHNIQUE: Contiguous axial images were obtained from the base of the skull through the vertex without intravenous contrast. RADIATION DOSE REDUCTION: This exam was performed according to the departmental dose-optimization  program which includes automated exposure control, adjustment of the mA and/or kV according to patient size and/or use of iterative reconstruction technique. COMPARISON:  05/15/2022 FINDINGS: Brain: No evidence of acute infarction, hemorrhage, hydrocephalus, extra-axial collection or mass lesion/mass effect. Remote left parietal lobe infarct. Patchy low-density changes within the periventricular and subcortical white matter most compatible with chronic microvascular ischemic change. Moderate diffuse cerebral volume loss. Vascular: Atherosclerotic calcifications involving the large vessels of the skull base. No unexpected hyperdense vessel. Skull: Normal. Negative for fracture or focal lesion. Sinuses/Orbits: No acute finding. Other: None. IMPRESSION: 1. No acute intracranial abnormality. 2. Chronic microvascular ischemic change and cerebral volume loss. Remote left parietal lobe infarct. Electronically Signed   By: Duanne Guess D.O.   On: 11/16/2022 15:14   DG Chest Port 1 View  Result Date: 11/16/2022 CLINICAL DATA:  Aspiration.  Hypoxia. EXAM: PORTABLE CHEST 1 VIEW COMPARISON:  05/15/2022 FINDINGS: Heart size remains within normal limits. Permanent pacemaker remains in appropriate position. Aortic atherosclerotic calcification incidentally noted. Low lung volumes are again seen bilaterally, with mild bibasilar atelectasis. Lungs are otherwise clear. No evidence of pleural effusion. IMPRESSION: Low lung volumes and mild bibasilar atelectasis. Electronically Signed   By: Danae Orleans M.D.   On: 11/16/2022 13:39       Kathlen Mody M.D. [facility] 11/18/2022, 8:54 AM  Available via Epic secure chat 7am-7pm After 7 pm, please refer to night coverage provider listed on amion.

## 2022-11-19 ENCOUNTER — Inpatient Hospital Stay (HOSPITAL_COMMUNITY): Payer: Medicare HMO

## 2022-11-19 DIAGNOSIS — Z8673 Personal history of transient ischemic attack (TIA), and cerebral infarction without residual deficits: Secondary | ICD-10-CM | POA: Diagnosis not present

## 2022-11-19 DIAGNOSIS — G8191 Hemiplegia, unspecified affecting right dominant side: Secondary | ICD-10-CM

## 2022-11-19 DIAGNOSIS — R569 Unspecified convulsions: Secondary | ICD-10-CM | POA: Diagnosis not present

## 2022-11-19 DIAGNOSIS — Z7189 Other specified counseling: Secondary | ICD-10-CM | POA: Diagnosis not present

## 2022-11-19 DIAGNOSIS — R401 Stupor: Secondary | ICD-10-CM | POA: Diagnosis not present

## 2022-11-19 DIAGNOSIS — J9601 Acute respiratory failure with hypoxia: Secondary | ICD-10-CM | POA: Diagnosis not present

## 2022-11-19 DIAGNOSIS — Z931 Gastrostomy status: Secondary | ICD-10-CM | POA: Diagnosis not present

## 2022-11-19 DIAGNOSIS — T17908A Unspecified foreign body in respiratory tract, part unspecified causing other injury, initial encounter: Secondary | ICD-10-CM | POA: Diagnosis not present

## 2022-11-19 LAB — URINE CULTURE: Culture: <10000 — AB

## 2022-11-19 MED ORDER — BISACODYL 10 MG RE SUPP
10.0000 mg | Freq: Once | RECTAL | Status: AC
  Administered 2022-11-19: 10 mg via RECTAL
  Filled 2022-11-19: qty 1

## 2022-11-19 MED ORDER — POTASSIUM CHLORIDE 10 MEQ/100ML IV SOLN
10.0000 meq | INTRAVENOUS | Status: AC
  Administered 2022-11-19 (×2): 10 meq via INTRAVENOUS
  Filled 2022-11-19 (×2): qty 100

## 2022-11-19 NOTE — Care Management Important Message (Signed)
Important Message  Patient Details  Name: Kathleen Jensen MRN: [id number] Date of Birth: March 11, 1951   Medicare Important Message Given:  Yes     [name] 11/19/2022, 12:03 PM

## 2022-11-19 NOTE — Plan of Care (Signed)
  Problem: Coping: Goal: Level of anxiety will decrease Outcome: Progressing   Problem: Pain Managment: Goal: General experience of comfort will improve Outcome: Progressing   Problem: Safety: Goal: Ability to remain free from injury will improve Outcome: Progressing   

## 2022-11-19 NOTE — Progress Notes (Signed)
[facility]                                                                               Kathleen Jensen, is a 72 y.o. female, DOB - 10-29-1950, [id number] Admit date - 11/16/2022    Outpatient Primary MD for the patient is Patient, No Pcp Per  LOS - 3  days    Brief summary   Patient is a 72 year old female with history of right-sided hemiplegia and expressive aphasia secondary to a stroke in 2018.  She has a history of A-fib, pacemaker in place, seizures, HTN, DM, hypothyroidism, anemia. She has a PEG tube in place for approximately the last year due to poor p.o. intake.  She has had a stepwise decline over the last year following sepsis from UTI and other infectious processes.  Patient has indwelling Foley as well.  Patient resides at [facility] usually and has been unable to talk for the last 4 months.  She presents to the ED with possible aspiration.  Patient was noted to have gurgling on respirations that came in place this morning and foul-smelling urine.  EMS brought her to the hospital she required nonrebreather.  She was then weaned to 3 L via nasal cannula and then aspirated some more while in the ED despite not having any thing by mouth or any tube feeds going and advance again back to nonrebreather to support her respirations.  In the ED palliative care came and saw the patient had a long lengthy conversation with the family.  Given her worsening course the family decided to make her DNR/DNI but still wanted aggressive medical treatment.    Assessment & Plan    Assessment and Plan: * Acute respiratory failure with hypoxia initially required NRB transitioned to 3 lit of [place] oxygen.  Thought to be secondary to aspiration pneumonitis  On Unasyn, to continue.  Long discussion with palliative and the EDP around goals of care for this patient.  The family finally decided to make her DNR/DNI however they continue to want aggressive treatment with things like  antibiotics.  Palliative care consulted for goals of care discussions, plan to continue with treatment with watchful waiting, and no escalation of care if she declines and possible transition to comfort measure if she continues to decline.  We were able to wean her oxygen down to 3 lit/min.  Pulmonary hygiene.    Seizures Continue Depakote and Dilantin  Abdominal distention Patient with history of Roux-en-Y ABD X ray showing SBO vs ileus. Tube feeds on hold for now. Getting an CT abd and pelvis without contrast for further evaluation of the persistent dilated bowel loops.  Ordered dulcolax suppository.     H/o CVA With significant ongoing issues Patient has  tube feeds but we are holding this due to her aspiration pneumonia/ SBO. Expressive aphasia, she is at baseline not following commands , non verbal .  Expressive aphasia Currently nonverbal Significant risk of aspiration  Diabetes mellitus without complication CBG (last 3)  Recent Labs    11/16/22 1604 11/16/22 2046 11/17/22 0212  GLUCAP 111* 68* 135*    Tube feeds on hold for aspiration pneumonia.  Currently on  dextrose fluids.   Essential hypertension Well controlled.   Hypothyroidism TSH wnl Has been off her meds recently    Hyponatremia  Sodium improved from 129 to 131 to 134.   Hypokalemia:  Replaced.     Estimated body mass index is 30.08 kg/m as calculated from the following:   Height as of 05/16/22: 5\' 5"  (1.651 m).   Weight as of 05/16/22: 82 kg.  Code Status: DNR DVT Prophylaxis:  SCDs Start: 11/16/22 2214   Level of Care: Level of care: Progressive Family Communication: none at bedside. Discussed with daughter over the phone.   Disposition Plan:     Remains inpatient appropriate:  watchful observation on IV antibiotics.   Procedures:  None.   Consultants:   PALLIATIVE CARE.   Antimicrobials:   Anti-infectives (From admission, onward)    Start     Dose/Rate Route Frequency  Ordered Stop   11/17/22 1030  Ampicillin-Sulbactam (UNASYN) 3 g in sodium chloride 0.9 % 100 mL IVPB        3 g 200 mL/hr over 30 Minutes Intravenous Every 6 hours 11/17/22 1035     11/16/22 2045  Ampicillin-Sulbactam (UNASYN) 3 g in sodium chloride 0.9 % 100 mL IVPB        3 g 200 mL/hr over 30 Minutes Intravenous  Once 11/16/22 2043 11/16/22 2137        Medications  Scheduled Meds:  acetaminophen  640 mg Per Tube Q8H   amiodarone  200 mg Per Tube Daily   aspirin  81 mg Per Tube QHS   Chlorhexidine Gluconate Cloth  6 each Topical Daily   leptospermum manuka honey  1 Application Topical Daily   metoprolol tartrate  12.5 mg Per Tube BID   mouth rinse  15 mL Mouth Rinse 4 times per day   phenytoin  175 mg Per Tube BID   valproic acid  500 mg Per Tube QID   Continuous Infusions:  ampicillin-sulbactam (UNASYN) IV 3 g (11/19/22 0901)   dextrose 5 % and 0.9% NaCl 100 mL/hr at 11/19/22 0101   potassium chloride 10 mEq (11/19/22 0934)   PRN Meds:.HYDROcodone-acetaminophen, metoprolol tartrate, morphine injection, mouth rinse    Subjective:   Natoshia Hagins was seen and examined today.  She is more calmer today. No new events overnight.  Were able to wean her oxygen down to 3 lit/min.   Objective:   Vitals:   11/19/22 0325 11/19/22 0724 11/19/22 0725 11/19/22 1028  BP: (!) 149/74 134/80 134/80 (!) 150/86  Pulse: 60 60 60 60  Resp: 18 20 20 14   Temp: (!) 97.5 F (36.4 C) (!) 96.1 F (35.6 C) (!) 96.1 F (35.6 C) 97.6 F (36.4 C)  TempSrc: Axillary Axillary Axillary Axillary  SpO2: 100% 100% 100% 98%    Intake/Output Summary (Last 24 hours) at 11/19/2022 1053 Last data filed at 11/19/2022 1030 Gross per 24 hour  Intake 2218.57 ml  Output 3150 ml  Net -931.43 ml    There were no vitals filed for this visit.   Exam General exam: Ill appearing lady, does not follow commands, appears to calm , not in any distress.   Respiratory system: rhonchi in the upper lobes, on  3lit/min oxygen. Diminished air entry at bases.  Cardiovascular system: S1 & S2 heard, RRR. No JVD, Gastrointestinal system: Abdomen is distended, grimacing on palpation. Bs + Central nervous system: doe snot follow commands at baseline, non verbal Extremities: no cyanosis  Skin: No rashes seen.  Psychiatry: calm  today.         Data Reviewed:  I have personally reviewed following labs and imaging studies   CBC Lab Results  Component Value Date   WBC 6.0 11/18/2022   RBC 4.14 11/18/2022   HGB 12.9 11/18/2022   HCT 39.0 11/18/2022   MCV 94.2 11/18/2022   MCH 31.2 11/18/2022   PLT 239 11/18/2022   MCHC 33.1 11/18/2022   RDW 14.7 11/18/2022   LYMPHSABS 2.3 11/18/2022   MONOABS 0.7 11/18/2022   EOSABS 0.1 11/18/2022   BASOSABS 0.0 11/18/2022     Last metabolic panel Lab Results  Component Value Date   NA 134 (L) 11/18/2022   K 3.2 (L) 11/18/2022   CL 95 (L) 11/18/2022   CO2 31 11/18/2022   BUN 5 (L) 11/18/2022   CREATININE 0.37 (L) 11/18/2022   GLUCOSE 108 (H) 11/18/2022   GFRNONAA >60 11/18/2022   GFRAA >60 04/14/2020   CALCIUM 8.0 (L) 11/18/2022   PHOS 3.4 05/17/2022   PROT 8.1 11/16/2022   ALBUMIN 1.9 (L) 11/16/2022   BILITOT 0.5 11/16/2022   ALKPHOS 134 (H) 11/16/2022   AST 48 (H) 11/16/2022   ALT 19 11/16/2022   ANIONGAP 8 11/18/2022    CBG (last 3)  Recent Labs    11/16/22 1604 11/16/22 2046 11/17/22 0212  GLUCAP 111* 68* 135*       Coagulation Profile: No results for input(s): "INR", "PROTIME" in the last 168 hours.   Radiology Studies: DG Abd Portable 1V  Result Date: 11/18/2022 CLINICAL DATA:  Abdominal distention EXAM: PORTABLE ABDOMEN - 1 VIEW COMPARISON:  Abdominal radiograph dated November 24, 2018 FINDINGS: Gastrostomy tube in place. Unchanged gaseous distention of multiple bowel loops, predominantly large bowel. Mild left basilar opacity which is likely due to atelectasis. Unchanged radiopaque density of the right lower quadrant.  Partially visualized pacer leads. No acute osseous abnormality. IMPRESSION: 1. 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 2. Unchanged radiopaque density of the right lower quadrant, which may be a embolization coils or an ingested foreign body. Correlate with procedural/surgical history. Electronically Signed   By: Allegra Lai M.D.   On: 11/18/2022 20:46       Kathlen Mody M.D. [facility] 11/19/2022, 10:53 AM  Available via Epic secure chat 7am-7pm After 7 pm, please refer to night coverage provider listed on amion.

## 2022-11-19 NOTE — Progress Notes (Signed)
Daily Progress Note   Patient Name: Kathleen Jensen       Date: 11/19/2022 DOB: 1951/03/02  Age: 72 y.o. MRN#: [id number] Attending Physician: Kathlen Mody, MD Primary Care Physician: Patient, No Pcp Per Admit Date: 11/16/2022  Reason for Consultation/Follow-up: Establishing goals of care  Subjective: I have reviewed medical records including progress notes, MAR, imaging and labs. Received report from primary RN - patient sleeps most of the time and is very uncomfortable with any personal care or movements.  No family present during visit.  I then called patient's daughter Kathleen Jensen for ongoing goals of care discussions and palliative support.  She asked for me to call back in about 30 minutes, as she was at work.  We then spoke again and reviewed course of events since her last discussion with my colleague.  She understands that patient has abdominal distention with pending CT scan results and overall patient has not improved.  Patient had a small bowel obstruction last year which did resolve with NGT decompression.  We reviewed that patient would be a poor candidate for surgery should that not work again this time.  We also discussed the likelihood of ileus and that patient's body just no longer has the energy to function properly.  Both of her sisters plan to visit this evening, as will Kathleen Jensen.  She hopes that they will come to a consensus about how to proceed and focus patient's care.  I shared that while patient has as needed pain medications on board, none have been administered.  This is likely due to patient's somnolence.  Kathleen Jensen verbalized understanding appreciation.  All questions and concerns addressed. Encouraged to call with questions and/or concerns. PMT number  provided.  Length of Stay: 3  Physical Exam Vitals and nursing note reviewed.  Constitutional:      General: She is sleeping. She is not in acute distress.    Appearance: She is ill-appearing.     Interventions: Nasal cannula in place.     Comments: 3L  Cardiovascular:     Rate and Rhythm: Normal rate.  Pulmonary:     Effort: No respiratory distress.  Skin:    General: Skin is warm and dry.            Vital Signs: BP (!) 150/86 (BP Location: Right Arm)   Pulse  60   Temp 97.6 F (36.4 C) (Axillary)   Resp 14   SpO2 98%  SpO2: SpO2: 98 % O2 Device: O2 Device: Nasal Cannula O2 Flow Rate: O2 Flow Rate (L/min): 3 L/min       Palliative Assessment/Data: PPS 10%   Palliative Care Assessment & Plan   Patient Profile: 72 y.o. female  with past medical history of DM2, HTN, HLD, CVA in 2018 with subsequent right hemiplegia and expressive aphasia with PEG tube and bedbound at baseline, cognitive communication deficit, history of PE, hypothyroidism, seizure disorder, hypothyroidism, anemia, atrial fibrillation with a pacemaker not on AC due to bleeding presented to ED on 11/16/2022 from [facility] with staff concerns of foul-smelling urine, gurgling respirations, hypoxia.   Assessment: Principal Problem:   Acute respiratory failure Active Problems:   Hypothyroidism   OBSTRUCTIVE SLEEP APNEA   Essential hypertension   STRESS INCONTINENCE   Diabetes mellitus without complication   Expressive aphasia   Seizures   Hyperlipidemia associated with type 2 diabetes mellitus   History of cerebral infarction   Right hemiplegia   PEG (percutaneous endoscopic gastrostomy) status   Abdominal distention   Concern about end of life  Recommendations/Plan: Continue DNR/DNI Continue current care and watchful waiting.  Patient's 3 daughters plan to meet tonight for further discussions and await results of the abdominal CT scan.  They understand that she has not improved as of  today with high risk for further complications Psychosocial and emotional support provided PMT will continue to follow and support holistically  Prognosis:  Poor   Discharge Planning: To Be Determined  Care plan was discussed with RN, patient's daughter/HCPOA, Dr. Blake Divine   MDM: High   Richardson Dopp, [facility] Palliative Medicine Team Team phone # [phone]  Thank you for allowing the Palliative Medicine Team to assist in the care of this patient. Please utilize secure chat with additional questions, if there is no response within 30 minutes please call the above phone number.  Palliative Medicine Team providers are available by phone from 7am to 7pm daily and can be reached through the team cell phone.  Should this patient require assistance outside of these hours, please call the patient's attending physician.  Portions of this note are a verbal dictation therefore any spelling and/or grammatical errors are due to the "[employer]" system interpretation.

## 2022-11-20 DIAGNOSIS — J9601 Acute respiratory failure with hypoxia: Secondary | ICD-10-CM | POA: Diagnosis not present

## 2022-11-20 DIAGNOSIS — T17908A Unspecified foreign body in respiratory tract, part unspecified causing other injury, initial encounter: Secondary | ICD-10-CM | POA: Diagnosis not present

## 2022-11-20 DIAGNOSIS — Z8673 Personal history of transient ischemic attack (TIA), and cerebral infarction without residual deficits: Secondary | ICD-10-CM | POA: Diagnosis not present

## 2022-11-20 DIAGNOSIS — R401 Stupor: Secondary | ICD-10-CM | POA: Diagnosis not present

## 2022-11-20 DIAGNOSIS — R638 Other symptoms and signs concerning food and fluid intake: Secondary | ICD-10-CM

## 2022-11-20 LAB — CBC
HCT: 39.3 % (ref 36.0–46.0)
Hemoglobin: 12.7 g/dL (ref 12.0–15.0)
MCH: 30.2 pg (ref 26.0–34.0)
MCHC: 32.3 g/dL (ref 30.0–36.0)
MCV: 93.6 fL (ref 80.0–100.0)
Platelets: 163 10*3/uL (ref 150–400)
RBC: 4.2 MIL/uL (ref 3.87–5.11)
RDW: 14.8 % (ref 11.5–15.5)
WBC: 6.1 10*3/uL (ref 4.0–10.5)
nRBC: 0 % (ref 0.0–0.2)

## 2022-11-20 LAB — BASIC METABOLIC PANEL
Anion gap: 6 (ref 5–15)
BUN: <5 mg/dL — ABNORMAL LOW (ref 8–23)
CO2: 28 mmol/L (ref 22–32)
Calcium: 7.7 mg/dL — ABNORMAL LOW (ref 8.9–10.3)
Chloride: 101 mmol/L (ref 98–111)
Creatinine, Ser: 0.39 mg/dL — ABNORMAL LOW (ref 0.44–1.00)
GFR, Estimated: >60 mL/min (ref >60)
Glucose, Bld: 107 mg/dL — ABNORMAL HIGH (ref 70–99)
Potassium: 4.4 mmol/L (ref 3.5–5.1)
Sodium: 135 mmol/L (ref 135–145)

## 2022-11-20 LAB — COMPREHENSIVE METABOLIC PANEL
ALT: 16 U/L (ref 0–44)
AST: 37 U/L (ref 15–41)
Albumin: 1.6 g/dL — ABNORMAL LOW (ref 3.5–5.0)
Alkaline Phosphatase: 100 U/L (ref 38–126)
Anion gap: 5 (ref 5–15)
BUN: <5 mg/dL — ABNORMAL LOW (ref 8–23)
CO2: 30 mmol/L (ref 22–32)
Calcium: 7.2 mg/dL — ABNORMAL LOW (ref 8.9–10.3)
Chloride: 97 mmol/L — ABNORMAL LOW (ref 98–111)
Creatinine, Ser: 0.38 mg/dL — ABNORMAL LOW (ref 0.44–1.00)
GFR, Estimated: >60 mL/min (ref >60)
Glucose, Bld: 130 mg/dL — ABNORMAL HIGH (ref 70–99)
Potassium: 2.4 mmol/L — CL (ref 3.5–5.1)
Sodium: 132 mmol/L — ABNORMAL LOW (ref 135–145)
Total Bilirubin: 0.6 mg/dL (ref 0.3–1.2)
Total Protein: 7.1 g/dL (ref 6.5–8.1)

## 2022-11-20 LAB — PHOSPHORUS: Phosphorus: 1.7 mg/dL — ABNORMAL LOW (ref 2.5–4.6)

## 2022-11-20 LAB — MAGNESIUM: Magnesium: 1.2 mg/dL — ABNORMAL LOW (ref 1.7–2.4)

## 2022-11-20 MED ORDER — MAGNESIUM SULFATE 4 GM/100ML IV SOLN
4.0000 g | Freq: Once | INTRAVENOUS | Status: AC
  Administered 2022-11-20: 4 g via INTRAVENOUS
  Filled 2022-11-20: qty 100

## 2022-11-20 MED ORDER — POTASSIUM CHLORIDE 10 MEQ/100ML IV SOLN
10.0000 meq | INTRAVENOUS | Status: AC
  Administered 2022-11-20 (×3): 10 meq via INTRAVENOUS
  Filled 2022-11-20 (×3): qty 100

## 2022-11-20 MED ORDER — POTASSIUM CHLORIDE 10 MEQ/100ML IV SOLN
10.0000 meq | Freq: Once | INTRAVENOUS | Status: AC
  Administered 2022-11-20: 10 meq via INTRAVENOUS
  Filled 2022-11-20: qty 100

## 2022-11-20 MED ORDER — LIP MEDEX EX OINT: TOPICAL_OINTMENT | CUTANEOUS | Status: DC | PRN

## 2022-11-20 MED ORDER — POTASSIUM PHOSPHATES 15 MMOLE/5ML IV SOLN
30.0000 mmol | Freq: Once | INTRAVENOUS | Status: AC
  Administered 2022-11-20: 30 mmol via INTRAVENOUS
  Filled 2022-11-20: qty 10

## 2022-11-20 MED ORDER — POTASSIUM CHLORIDE 20 MEQ PO PACK
40.0000 meq | PACK | Freq: Once | ORAL | Status: AC
  Administered 2022-11-20: 40 meq via ORAL
  Filled 2022-11-20: qty 2

## 2022-11-20 NOTE — Progress Notes (Signed)
Pharmacy Antibiotic Note  Kathleen Jensen is a 72 y.o. female admitted on 11/16/2022 with  aspiration pneumonia .  Pharmacy has been consulted for Unasyn dosing. First dose given 4/20 at 2107 PM.   WBC remains within normal limits and patient continues to be afebrile. SCr is stable at 0.38.  Plan: Continue Unasyn 3g IV every 6 hours for a total of 5 days Monitor daily CBC, temp, SCr, and for clinical signs of improvement     Temp (24hrs), Avg:97.9 F (36.6 C), Min:97.4 F (36.3 C), Max:98.3 F (36.8 C)  Recent Labs  Lab 11/16/22 1411 11/18/22 1843 11/20/22 0708  WBC 10.3 6.0 6.1  CREATININE 0.44 0.37* 0.38*     CrCl cannot be calculated (Unknown ideal weight.).    Allergies  Allergen Reactions   Tape Rash   Demerol [Meperidine Hcl] Nausea And Vomiting   Sulfonamide Derivatives Itching   Bacitracin-Polymyxin B Dermatitis   Oxycontin [Oxycodone] Itching    Antimicrobials this admission: Unasyn 4/20 >> (4/25)  Dose adjustments this admission: None  Microbiology results: 4/22 Ur cx: insignificant growth   Thank you for allowing pharmacy to be a part of this patient's care.  Wilburn Cornelia, PharmD, BCPS Clinical Pharmacist 11/20/2022 9:06 AM   Please refer to [facility]ION for pharmacy phone number

## 2022-11-20 NOTE — Plan of Care (Signed)
  Problem: Clinical Measurements: Goal: Respiratory complications will improve Outcome: Progressing Goal: Cardiovascular complication will be avoided Outcome: Progressing   Problem: Coping: Goal: Level of anxiety will decrease Outcome: Progressing   Problem: Safety: Goal: Ability to remain free from injury will improve Outcome: Progressing   

## 2022-11-20 NOTE — Progress Notes (Addendum)
PROGRESS NOTE Kathleen Jensen  UJW:[id number] DOB: 1951/06/22 DOA: 11/16/2022 PCP: Patient, No Pcp Per  Brief Narrative/Hospital Course: 38 yof w/ right-sided hemiplegia and expressive aphasia secondary to a stroke in 2018,of A-fib, pacemaker in place, seizures, HTN, DM, hypothyroidism, anemia w/ a PEG tube in place for approximately the last year due to poor p.o. intake who had a stepwise decline over the last year following sepsis from UTI and other infectious processes, has indwelling Foley as well and is resident of [facility] usually who is unable to talk for the last 4 months presented with possible aspiration. She was noted to have gurgling on respirations that came in place this morning and foul-smelling urine. EMS brought her to the hospital she required nonrebreather. She was then weaned to 3 L via nasal cannula and then aspirated some more while in the ED despite not having any thing by mouth or any tube feeds going and advanced again back to nonrebreather to support her respirations. In the ED palliative care came and saw the patient had a long lengthy conversation with the family. Given her worsening course the family decided to make her DNR/DNI but still wanted aggressive medical treatment. She is being managed medically with antibiotics, supportive care Palliative has been following closely    Subjective: Seen and examined this morning while sleeping did not wake up in the morning Past 24 hours has been afebrile blood pressure 100 2160s, doing well on 2 L nasal cannula Urine culture less than 10,000 CFU-insignificant  Assessment and Plan: Principal Problem:   Acute respiratory failure Active Problems:   Seizures   Hypothyroidism   OBSTRUCTIVE SLEEP APNEA   Essential hypertension   STRESS INCONTINENCE   Diabetes mellitus without complication   Expressive aphasia   Hyperlipidemia associated with type 2 diabetes mellitus   History of cerebral infarction   Right  hemiplegia   PEG (percutaneous endoscopic gastrostomy) status   Abdominal distention    Acute respiratory failure with hypoxia Aspiration pneumonia in the setting of expressive aphasia and previous history of: initially needed NRB transitioned to 3 [place] Hypoxia secondary to aspiration pneumonitis.  Continue Unasyn through 4/26, cont respiratory care, aspiration precautions. family decided to make her DNR/DNI however they want to continue on current treatment with antibiotics, palliative care following for ongoing goals of care:plan to continue with treatment with watchful waiting, and no escalation of care if she declines and possible transition to comfort measure if she continues to decline.    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  Tube feeding remains on hold. CT abd and pelvis 4/23: Showed proctitis advised follow-up for resolution and to exclude underlying neoplasm, showed diffuse colonic distention reflecting element of colonic ileus or functional obstruction. Continue Dulcolax suppository. She is on unasyn as above.Check x-ray in the morning   H/o CVA W/ right-sided hemiplegia and expressive aphasia non verbal: S/P PEG: holding Tube Feed due to aspiration pneumonia/abdominal distention     DM w/ hypoglycemia Tube feeds on hold for aspiration pneumonia.  Currently on dextrose fluids.  Recent Labs  Lab 11/16/22 1515 11/16/22 1604 11/16/22 2046 11/17/22 0212  GLUCAP 53* 111* 68* 135*     Essential hypertension:Well controlled.  Not on meds currently   Hypothyroidism:TSH wnl.Has been off her meds recently   Hyponatremia: Monitor   Multiple electrolyte imbalance with severe hypokalemia hypomagnesemia and hypophosphatemia: Will replete with IV AND  V/I peg and recheck.  Corrected calcium 9.0. Recent Labs  Lab 11/16/22 1411 11/16/22 1418 11/18/22 1843  11/20/22 0708  K 4.6 4.7 3.2* 2.4*  CALCIUM 8.1*  --  8.0* 7.2*  MG  --   --   --  1.2*  PHOS  --   --   --  1.7*   Goc; DNR.  Patient remains seriously ill with complex comorbidities, at risk of readmission, decompensation.  Palliative care following, continue current care with no escalation of care and possible transition to comfort measures awaiting further family meeting and discussion  DVT prophylaxis: SCDs Start: 11/16/22 2214 Code Status:   Code Status: DNR Family Communication: plan of care discussed with patient at bedside.  No family at the bedside.  Palliative care updating. Patient status is: Inpatient because of aspiration pneumonia Level of care: Progressive   Dispo: The patient is from: NH            Anticipated disposition:  TBD Objective: Vitals last 24 hrs: Vitals:   11/19/22 2041 11/19/22 2259 11/20/22 0258 11/20/22 0829  BP: (!) 154/77 (!) 154/73 (!) 163/93 111/87  Pulse: 60 60    Resp: Temp: 97.8 F (36.6 C) 97.9 F (36.6 C) 98.3 F (36.8 C) 98.3 F (36.8 C)  TempSrc: Axillary Axillary Axillary Axillary  SpO2: 100% 100%  98%   Weight change:   Physical Examination: General exam: Sleeping- woke up on calling, moaning HEENT:Oral mucosa moist, Ear/Nose WNL grossly Respiratory system: bilaterally diminished BS, no use of accessory muscle Cardiovascular system: S1 & S2 +, No JVD. Gastrointestinal system: Abdomen soft, distended, PEG+ BS SLUGGISH Nervous System:Alert, awake, moving extremities. Extremities: LE edema + w/ dressing+  Skin: No rashes,no icterus. MSK: Normal muscle bulk,tone, power  Medications reviewed:  Scheduled Meds:  acetaminophen  640 mg Per Tube Q8H   amiodarone  200 mg Per Tube Daily   aspirin  81 mg Per Tube QHS   Chlorhexidine Gluconate Cloth  6 each Topical Daily   leptospermum manuka honey  1 Application Topical Daily   metoprolol tartrate  12.5 mg Per Tube BID   mouth rinse  15 mL Mouth Rinse 4 times per day    phenytoin  175 mg Per Tube BID   potassium chloride  40 mEq Oral Once   valproic acid  500 mg Per Tube QID   Continuous Infusions:  ampicillin-sulbactam (UNASYN) IV 3 g (11/20/22 0623)   dextrose 5 % and 0.9% NaCl 100 mL/hr at 11/19/22 2308   magnesium sulfate bolus IVPB     potassium chloride     potassium PHOSPHATE IVPB (in mmol)     Diet Order             Diet NPO time specified  Diet effective now                  Intake/Output Summary (Last 24 hours) at 11/20/2022 1028 Last data filed at 11/20/2022 1610 Gross per 24 hour  Intake 2565.52 ml  Output 3700 ml  Net -1134.48 ml   Net IO Since Admission: -755.91 mL [11/20/22 1028]  Wt Readings from Last 3 Encounters:  05/16/22 82 kg  02/21/22 81.2 kg  05/29/21 78.3 kg     Unresulted Labs (From admission, onward)    None     Data Reviewed: I have personally reviewed following labs and imaging studies CBC: Recent Labs  Lab 11/16/22 1411 11/16/22 1418 11/18/22 1843 11/20/22 0708  WBC  10.3  --  6.0 6.1  NEUTROABS 5.6  --  2.8  --   HGB 12.5 13.3 12.9 12.7  HCT 37.2 39.0 39.0 39.3  MCV 92.5  --  94.2 93.6  PLT 202  --  239 163   Basic Metabolic Panel: Recent Labs  Lab 11/16/22 1411 11/16/22 1418 11/18/22 1843 11/20/22 0708  NA 129* 131* 134* 132*  K 4.6 4.7 3.2* 2.4*  CL 90*  --  95* 97*  CO2 31  --  31 30  GLUCOSE 75  --  108* 130*  BUN 20  --  5* <5*  CREATININE 0.44  --  0.37* 0.38*  CALCIUM 8.1*  --  8.0* 7.2*  MG  --   --   --  1.2*  PHOS  --   --   --  1.7*   GFR: CrCl cannot be calculated (Unknown ideal weight.). Liver Function Tests: Recent Labs  Lab 11/16/22 1411 11/20/22 0708  AST 48* 37  ALT 19 16  ALKPHOS 134* 100  BILITOT 0.5 0.6  PROT 8.1 7.1  ALBUMIN 1.9* 1.6*   No results for input(s): "LIPASE", "AMYLASE" in the last 168 hours. Recent Labs  Lab 11/16/22 1411  AMMONIA 43*  CBG:  Recent Results (from the past 240 hour(s))  Resp panel by RT-PCR (RSV, Flu A&B, Covid)  Anterior Nasal Swab     Status: None   Collection Time: 11/16/22  2:11 PM   Specimen: Anterior Nasal Swab  Result Value Ref Range Status   SARS Coronavirus 2 by RT PCR NEGATIVE NEGATIVE Final   Influenza A by PCR NEGATIVE NEGATIVE Final   Influenza B by PCR NEGATIVE NEGATIVE Final    Comment: (NOTE) The Xpert Xpress SARS-CoV-2/FLU/RSV plus assay is intended as an aid in the diagnosis of influenza from Nasopharyngeal swab specimens and should not be used as a sole basis for treatment. Nasal washings and aspirates are unacceptable for Xpert Xpress SARS-CoV-2/FLU/RSV testing.  Fact Sheet for Patients: BloggerCourse.com  Fact Sheet for Healthcare Providers: SeriousBroker.it  This test is not yet approved or cleared by the [place] FDA and has been authorized for detection and/or diagnosis of SARS-CoV-2 by FDA under an Emergency Use Authorization (EUA). This EUA will remain in effect (meaning this test can be used) for the duration of the COVID-19 declaration under Section 564(b)(1) of the Act, 21 U.S.C. section 360bbb-3(b)(1), unless the authorization is terminated or revoked.     Resp Syncytial Virus by PCR NEGATIVE NEGATIVE Final    Comment: (NOTE) Fact Sheet for Patients: BloggerCourse.com  Fact Sheet for Healthcare Providers: SeriousBroker.it  This test is not yet approved or cleared by the [place] FDA and has been authorized for detection and/or diagnosis of SARS-CoV-2 by FDA under an Emergency Use Authorization (EUA). This EUA will remain in effect (meaning this test can be used) for the duration of the COVID-19 declaration under Section 564(b)(1) of the Act, 21 U.S.C. section 360bbb-3(b)(1), unless the authorization is terminated or revoked.  Performed at [facility], [address], [place], [place] [address]   Urine Culture (for pregnant, neutropenic  or urologic patients or patients with an indwelling urinary catheter)     Status: Abnormal   Collection Time: 11/18/22  6:41 PM   Specimen: Urine, Clean Catch  Result Value Ref Range Status   Specimen Description URINE, CLEAN CATCH  Final   Special Requests NONE  Final   Culture (A)  Final    <10,000 COLONIES/mL  INSIGNIFICANT GROWTH Performed at [facility], [address] [place], [place] [address]    Report Status 11/19/2022 FINAL  Final    Antimicrobials: Anti-infectives (From admission, onward)    Start     Dose/Rate Route Frequency Ordered Stop   11/17/22 1030  Ampicillin-Sulbactam (UNASYN) 3 g in sodium chloride 0.9 % 100 mL IVPB        3 g 200 mL/hr over 30 Minutes Intravenous Every 6 hours 11/17/22 1035 11/22/22 0444   11/16/22 2045  Ampicillin-Sulbactam (UNASYN) 3 g in sodium chloride 0.9 % 100 mL IVPB        3 g 200 mL/hr over 30 Minutes Intravenous  Once 11/16/22 2043 11/16/22 2137      Culture/Microbiology    Component Value Date/Time   SDES URINE, CLEAN CATCH 11/18/2022 1841   SPECREQUEST NONE 11/18/2022 1841   CULT (A) 11/18/2022 1841    <10,000 COLONIES/mL INSIGNIFICANT GROWTH Performed at [facility], [address] [place], [place] [address]    REPTSTATUS 11/19/2022 FINAL 11/18/2022 1841   ther culture-see note  Radiology Studies: CT ABDOMEN PELVIS WO CONTRAST  Result Date: 11/19/2022 CLINICAL DATA:  Suspected bowel obstruction EXAM: CT ABDOMEN AND PELVIS WITHOUT CONTRAST TECHNIQUE: Multidetector CT imaging of the abdomen and pelvis was performed following the standard protocol without IV contrast. Unenhanced CT was performed per clinician order. Lack of IV contrast limits sensitivity and specificity, especially for evaluation of abdominal/pelvic solid viscera. RADIATION DOSE REDUCTION: This exam was performed according to the departmental dose-optimization program which includes automated exposure control, adjustment of the mA and/or kV  according to patient size and/or use of iterative reconstruction technique. COMPARISON:  11/18/2022, 02/17/2022 FINDINGS: Lower chest: Trace bilateral pleural effusions, left greater than right. No acute airspace disease. Dual lead cardiac pacer is noted. Hepatobiliary: Unremarkable unenhanced appearance of the liver. Prior cholecystectomy. Pancreas: Unremarkable unenhanced appearance. Spleen: Unremarkable unenhanced appearance. Adrenals/Urinary Tract: Interval enlargement of a simple cyst seen on the prior CT, now measuring 4.4 cm with increased internal attenuation measuring up to 39 HU. This could reflect proteinaceous or hemorrhagic material within the cyst. No urinary tract calculi or obstructive uropathy. The adrenals are unremarkable. The bladder is decompressed with an indwelling Foley catheter. Stomach/Bowel: There is circumferential segmental rectal wall thickening extending to the anal verge, with mild perirectal fat stranding. Rectal wall measures up to 8 mm in thickness reference image 78/3. Findings may reflect proctitis. There is diffuse colonic distension, with gas fluid levels throughout the colon consistent with diarrhea or recent laxative/enema use. No evidence of small-bowel obstruction. Postsurgical changes are seen from prior bariatric surgery. There is a percutaneous enteric catheter within the left upper quadrant, likely within the jejunum. Position unchanged since prior exam. Vascular/Lymphatic: Aortic atherosclerosis. No enlarged abdominal or pelvic lymph nodes. Reproductive: Stable calcified uterine fibroids.  No adnexal masses. Other: No free fluid or free intraperitoneal gas. Small fat containing periumbilical ventral hernia unchanged. No bowel herniation. Musculoskeletal: No acute or destructive bony lesions. Postsurgical changes within the lower lumbar spine. Reconstructed images demonstrate no additional findings. IMPRESSION: 1. Segmental circumferential wall thickening involving the  distal rectum, most compatible with proctitis. Close follow-up is recommended to document resolution and exclude underlying neoplasm. 2. Diffuse colonic distension, with gas fluid levels identified. This could reflect an element of colonic ileus or functional obstruction due to the inflamed rectum described above. Gas fluid levels may reflect diarrhea or recent laxative/enema use. 3. Trace bilateral pleural effusions, left greater than right.  4.  Aortic Atherosclerosis (ICD10-I70.0). 5. Slightly hyperdense left renal cyst, likely representing hemorrhagic cyst. A simple cyst was seen in this location on prior exams. If further evaluation is clinically indicated, renal ultrasound could be performed. Electronically Signed   By: Sharlet Salina M.D.   On: 11/19/2022 15:43   DG Abd Portable 1V  Result Date: 11/18/2022 CLINICAL DATA:  Abdominal distention EXAM: PORTABLE ABDOMEN - 1 VIEW COMPARISON:  Abdominal radiograph dated November 24, 2018 FINDINGS: Gastrostomy tube in place. Unchanged gaseous distention of multiple bowel loops, predominantly large bowel. Mild left basilar opacity which is likely due to atelectasis. Unchanged radiopaque density of the right lower quadrant. Partially visualized pacer leads. No acute osseous abnormality. IMPRESSION: 1. 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 2. Unchanged radiopaque density of the right lower quadrant, which may be a embolization coils or an ingested foreign body. Correlate with procedural/surgical history. Electronically Signed   By: Allegra Lai M.D.   On: 11/18/2022 20:46     LOS: 4 days   Lanae Boast, MD [facility]  11/20/2022, 10:28 AM

## 2022-11-20 NOTE — Progress Notes (Signed)
Daily Progress Note   Patient Name: Kathleen Jensen       Date: 11/20/2022 DOB: [birth year]  Age: 72 y.o. MRN#: [id number] Attending Physician: Lanae Boast, MD Primary Care Physician: Patient, No Pcp Per Admit Date: 11/16/2022  Reason for Consultation/Follow-up: Establishing goals of care  Subjective: I have reviewed medical records including EPIC notes, MAR, and labs. Received report from primary RN - no acute concerns. RN reports patient arouses to patient care but otherwise is asleep.  Went to visit patient at bedside - no family/visitors present. Patient was lying in bed asleep - she is easily arousable to voice/gentle touch and opens her eyes, though quickly falls back asleep. No signs or non-verbal gestures of pain or discomfort noted. No respiratory distress, increased work of breathing, or secretions noted. Patient is ill and frail appearing. She is on 2L O2 [place].  10:43 AM Called daughter/[name]. Emotional support provided. Therapeutic listening provided as she reflects on the patient's interval history since we last spoke on Sunday as well as information given by other providers/PMT since then. [name] recognizes there have been no significant improvements in patient's condition; though indicates family were encouraged as "it seemed like she (patient) was trying to communicate yesterday (raising eye brows). We reviewed CT results to show current complications of proctitis and colonic distention reflecting colonic ileus vs SBO. We reviewed in detail patient's current plan of care. Family understand tube feeds are still being held due to aspiration and now bowel complications. Expressed concern that patient's body is shutting down and approaching EOL.  [name] notes that she and her  sister Chantel met last night but there is "not much communication" as they are "watching and waiting to hear results." Encouraged continued family communication.  [name] indicates there are no changes to plan of care at this time - she would like to receive updates on follow up xray tomorrow. Family will continue to make stepwise decisions pending clinical course.  All questions and concerns addressed. Encouraged to call with questions and/or concerns. PMT number previously provided.  Length of Stay: 4  Current Medications: Scheduled Meds:   acetaminophen  640 mg Per Tube Q8H   amiodarone  200 mg Per Tube Daily   aspirin  81 mg Per Tube QHS   Chlorhexidine Gluconate Cloth  6 each Topical Daily  leptospermum manuka honey  1 Application Topical Daily   metoprolol tartrate  12.5 mg Per Tube BID   mouth rinse  15 mL Mouth Rinse 4 times per day   phenytoin  175 mg Per Tube BID   potassium chloride  40 mEq Oral Once   valproic acid  500 mg Per Tube QID    Continuous Infusions:  ampicillin-sulbactam (UNASYN) IV 3 g (11/20/22 0623)   dextrose 5 % and 0.9% NaCl 100 mL/hr at 11/19/22 2308   magnesium sulfate bolus IVPB     potassium chloride     potassium PHOSPHATE IVPB (in mmol)      PRN Meds: HYDROcodone-acetaminophen, metoprolol tartrate, morphine injection, mouth rinse  Physical Exam Vitals and nursing note reviewed.  Constitutional:      General: She is not in acute distress.    Appearance: She is ill-appearing.  Pulmonary:     Effort: No respiratory distress.  Skin:    General: Skin is warm and dry.  Neurological:     Mental Status: She is lethargic.     Motor: Weakness present.  Psychiatric:        Speech: She is noncommunicative.             Vital Signs: BP 111/87 (BP Location: Right Arm)   Pulse 60   Temp 98.3 F (36.8 C) (Axillary)   Resp 13   SpO2 98%  SpO2: SpO2: 98 % O2 Device: O2 Device: Nasal Cannula O2 Flow Rate: O2 Flow Rate (L/min): 2  L/min  Intake/output summary:  Intake/Output Summary (Last 24 hours) at 11/20/2022 1032 Last data filed at 11/20/2022 0981 Gross per 24 hour  Intake 2565.52 ml  Output 3000 ml  Net -434.48 ml   LBM: Last BM Date : 11/19/22 Baseline Weight:   Most recent weight:         Palliative Assessment/Data: PPS 10%      Patient Active Problem List   Diagnosis Date Noted   Acute respiratory failure 11/16/2022   Abdominal distention 11/16/2022   AMS (altered mental status) 05/17/2022   Altered mental status 05/15/2022   Sacral wound 05/15/2022   Intracranial hemorrhage    Pressure injury of skin 02/18/2022   Chronic indwelling Foley catheter 02/18/2022   PAF (paroxysmal atrial fibrillation) 02/18/2022   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/18/2022   Septic shock 02/15/2022   Constipation    Abdominal pain    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   Right hemiplegia 04/12/2020   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   Hyperlipidemia associated with type 2 diabetes mellitus 07/30/2018   History of cerebral infarction 07/30/2018   Vitamin B 12 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/30/2018   Asthma 03/13/2018   Seizures 01/12/2018   CVA (cerebral vascular accident) 12/08/2017   Abnormal thyroid blood test 12/08/2017   Pulmonary embolism and infarction 12/08/2017   Anemia 12/08/2017   Expressive aphasia 10/15/2017   Seizure disorder 10/15/2017   Dysphagia 10/15/2017   Diabetes mellitus without complication    Hyperlipidemia 09/22/2017   Urinary frequency 09/22/2017   Airway hyperreactivity 01/26/2016   Disease of thyroid gland 01/26/2016   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   Other specified postprocedural states 01/26/2016   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   Chronic LBP 04/10/2015   Degeneration of intervertebral disc of lumbar region 04/10/2015   Spondylolisthesis of lumbar region 04/10/2015   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   History of  cardiac pacemaker in situ 01/09/2015   B12 DEFICIENCY 08/15/2008  UNSPECIFIED VITAMIN D DEFICIENCY 08/15/2008   SINUS BRADYCARDIA 08/15/2008   OBSTRUCTIVE SLEEP APNEA 08/11/2008   INSOMNIA 04/28/2008   Hyperlipidemia LDL goal <70 12/12/2006   RHINITIS, ALLERGIC NEC 12/12/2006   Hypothyroidism 12/11/2006   Essential hypertension 12/11/2006   GERD 12/11/2006   STRESS INCONTINENCE 12/11/2006    Palliative Care Assessment & Plan   Patient Profile: 72 y.o. female  with past medical history of DM2, HTN, HLD, CVA in 2018 with subsequent right hemiplegia and expressive aphasia with PEG tube and bedbound at baseline, cognitive communication deficit, history of PE, hypothyroidism, seizure disorder, hypothyroidism, anemia, atrial fibrillation with a pacemaker not on AC due to bleeding presented to ED on 11/16/2022 from [facility] with staff concerns of foul-smelling urine, gurgling respirations, hypoxia.   Assessment: Principal Problem:   Acute respiratory failure Active Problems:   Hypothyroidism   OBSTRUCTIVE SLEEP APNEA   Essential hypertension   STRESS INCONTINENCE   Diabetes mellitus without complication   Expressive aphasia   Seizures   Hyperlipidemia associated with type 2 diabetes mellitus   History of cerebral infarction   Right hemiplegia   PEG (percutaneous endoscopic gastrostomy) status   Abdominal distention   Concern about end of life  Recommendations/Plan: Continue to treat the treatable with watchful waiting. In the event of decline, family previously indicated they would not want care escalated, but rather transition to comfort measures  Continue DNR/DNI as previously documented Ongoing GOC pending clinical course PMT will continue to follow and support holistically  Goals of Care and Additional Recommendations: Limitations on Scope of Treatment: Full Scope Treatment  Code Status:    Code Status Orders  (From admission, onward)            Start     Ordered   11/16/22 2215  Do not attempt resuscitation (DNR)  Continuous       Question Answer Comment  If patient has no pulse and is not breathing Do Not Attempt Resuscitation   If patient has a pulse and/or is breathing: Medical Treatment Goals LIMITED ADDITIONAL INTERVENTIONS: Use medication/IV fluids and cardiac monitoring as indicated; Do not use intubation or mechanical ventilation (DNI), also provide comfort medications.  Transfer to Progressive/Stepdown as indicated, avoid Intensive Care.   Consent: Discussion documented in EHR or advanced directives reviewed      11/16/22 2216           Code Status History     Date Active Date Inactive Code Status Order ID Comments User Context   11/16/2022 2042 11/16/2022 2216 DNR [id number]  Rondel Baton, MD ED   11/16/2022 1325 11/16/2022 2042 Full Code [id number]  Rondel Baton, MD ED   05/15/2022 1946 05/18/2022 2321 Full Code [id number]  Tiffany Kocher, DO ED   02/15/2022 1947 02/22/2022 1859 Full Code [id number]  Leslye Peer, MD ED   05/29/2021 1500 06/07/2021 0431 Full Code [id number]  Orland Mustard, MD ED   04/12/2020 0114 04/14/2020 1847 Full Code [id number]  Hillary Bow, DO ED   10/05/2017 1544 10/15/2017 1619 Full Code [id number]  Black, Lesle Chris, NP ED       Prognosis:  poor  Discharge Planning: To Be Determined  Care plan was discussed with primary RN, patient's HCPOA/daughter, Dr. Jonathon Bellows  Thank you for allowing the Palliative Medicine Team to assist in the care of this patient.  Haskel Khan, NP  Please contact Palliative Medicine Team phone at [phone] for questions and concerns.   *Portions of this  note are a verbal dictation therefore any spelling and/or grammatical errors are due to the "[employer]agon [employer] One" system interpretation.

## 2022-11-20 NOTE — Hospital Course (Addendum)
72 yof w/ right-sided hemiplegia and expressive aphasia secondary to a stroke in 2018,of A-fib, pacemaker in place, seizures, HTN, DM, hypothyroidism, anemia w/ a PEG tube in place for approximately the last year due to poor p.o. intake who had a stepwise decline over the last year following sepsis from UTI and other infectious processes, has indwelling Foley as well and is resident of [facility] usually who is unable to talk for the last 4 months presented with possible aspiration. She was noted to have gurgling on respirations that came in place this morning and foul-smelling urine. EMS brought her to the hospital she required nonrebreather. She was then weaned to 3 L via nasal cannula and then aspirated some more while in the ED despite not having any thing by mouth or any tube feeds going and advanced again back to nonrebreather to support her respirations. In the ED palliative care came and saw the patient had a long lengthy conversation with the family. Given her worsening course the family decided to make her DNR/DNI but still wanted aggressive medical treatment. She is being managed medically with antibiotics, supportive care Palliative has been following closely

## 2022-11-21 ENCOUNTER — Inpatient Hospital Stay (HOSPITAL_COMMUNITY): Payer: Medicare HMO

## 2022-11-21 DIAGNOSIS — T17908A Unspecified foreign body in respiratory tract, part unspecified causing other injury, initial encounter: Secondary | ICD-10-CM | POA: Diagnosis not present

## 2022-11-21 DIAGNOSIS — Z8673 Personal history of transient ischemic attack (TIA), and cerebral infarction without residual deficits: Secondary | ICD-10-CM | POA: Diagnosis not present

## 2022-11-21 DIAGNOSIS — J9601 Acute respiratory failure with hypoxia: Secondary | ICD-10-CM | POA: Diagnosis not present

## 2022-11-21 DIAGNOSIS — Z515 Encounter for palliative care: Secondary | ICD-10-CM | POA: Diagnosis not present

## 2022-11-21 LAB — CBC
HCT: 39.2 % (ref 36.0–46.0)
Hemoglobin: 12.8 g/dL (ref 12.0–15.0)
MCH: 30.5 pg (ref 26.0–34.0)
MCHC: 32.7 g/dL (ref 30.0–36.0)
MCV: 93.6 fL (ref 80.0–100.0)
Platelets: 170 10*3/uL (ref 150–400)
RBC: 4.19 MIL/uL (ref 3.87–5.11)
RDW: 15.1 % (ref 11.5–15.5)
WBC: 5.9 10*3/uL (ref 4.0–10.5)
nRBC: 0 % (ref 0.0–0.2)

## 2022-11-21 LAB — BASIC METABOLIC PANEL
Anion gap: 6 (ref 5–15)
BUN: <5 mg/dL — ABNORMAL LOW (ref 8–23)
CO2: 28 mmol/L (ref 22–32)
Calcium: 7.4 mg/dL — ABNORMAL LOW (ref 8.9–10.3)
Chloride: 97 mmol/L — ABNORMAL LOW (ref 98–111)
Creatinine, Ser: 0.36 mg/dL — ABNORMAL LOW (ref 0.44–1.00)
GFR, Estimated: >60 mL/min (ref >60)
Glucose, Bld: 147 mg/dL — ABNORMAL HIGH (ref 70–99)
Potassium: 4.2 mmol/L (ref 3.5–5.1)
Sodium: 131 mmol/L — ABNORMAL LOW (ref 135–145)

## 2022-11-21 LAB — MAGNESIUM: Magnesium: 2 mg/dL (ref 1.7–2.4)

## 2022-11-21 LAB — PHOSPHORUS: Phosphorus: 3 mg/dL (ref 2.5–4.6)

## 2022-11-21 MED ORDER — BISACODYL 10 MG RE SUPP
10.0000 mg | Freq: Once | RECTAL | Status: AC
  Administered 2022-11-21: 10 mg via RECTAL
  Filled 2022-11-21: qty 1

## 2022-11-21 MED ORDER — METOPROLOL TARTRATE 25 MG PO TABS
25.0000 mg | ORAL_TABLET | Freq: Two times a day (BID) | ORAL | Status: DC
  Administered 2022-11-21 – 2022-12-03 (×19): 25 mg
  Filled 2022-11-21 (×25): qty 1

## 2022-11-21 MED ORDER — SIMVASTATIN 20 MG PO TABS
10.0000 mg | ORAL_TABLET | Freq: Every day | ORAL | Status: DC
  Administered 2022-11-21 – 2022-12-02 (×12): 10 mg
  Filled 2022-11-21 (×12): qty 1

## 2022-11-21 NOTE — Progress Notes (Signed)
Daily Progress Note   Patient Name: Kathleen Jensen       Date: 11/21/2022 DOB: 12-26-1950  Age: 72 y.o. MRN#: [id number] Attending Physician: Lanae Boast, MD Primary Care Physician: Patient, No Pcp Per Admit Date: 11/16/2022  Reason for Consultation/Follow-up: Establishing goals of care  Subjective: I have reviewed medical records including EPIC notes, MAR, and labs. Received report from primary RN -no acute concerns.  RN reports patient has been moaning a lot; otherwise asleep.  Went to visit patient at bedside - no family/visitors present.  Patient was lying in bed asleep - I did not attempt to wake her to preserve comfort. No signs or non-verbal gestures of pain or discomfort noted. No respiratory distress, increased work of breathing, or secretions noted.  She is very ill and frail appearing.  4:53 PM Called daughter/[name] -emotional support provided.  Therapeutic listening provided as she reflects on patient's clinical status when she was able to visit -notes that patient was not very responsive. [name] reviews conversation had with neurology regarding sedating medications and nursing home not providing medications as recommended.  Reviewed that patient's active orders for seizure medications match her PTA doses - she is glad to hear this.   Provided updates on follow-up x-ray from this morning.  Ileus education provided to include causes, treatment, trajectory with/without improvement.  No changes to goals.  In consideration that patient has not shown any significant improvement, encouraged 24 to 48 hours of time-limited trial of continued watchful waiting - [name] expressed understanding.  All questions and concerns addressed. Encouraged to call with questions and/or concerns.  PMT number previously provided.  Length of Stay: 5  Current Medications: Scheduled Meds:   acetaminophen  640 mg Per Tube Q8H   amiodarone  200 mg Per Tube Daily   aspirin  81 mg Per Tube QHS   Chlorhexidine Gluconate Cloth  6 each Topical Daily   leptospermum manuka honey  1 Application Topical Daily   metoprolol tartrate  25 mg Per Tube BID   mouth rinse  15 mL Mouth Rinse 4 times per day   phenytoin  175 mg Per Tube BID   simvastatin  10 mg Per Tube QHS   valproic acid  500 mg Per Tube QID    Continuous Infusions:  ampicillin-sulbactam (UNASYN) IV 3 g (11/21/22 1554)  dextrose 5 % and 0.9% NaCl 100 mL/hr at 11/21/22 1601    PRN Meds: HYDROcodone-acetaminophen, lip balm, metoprolol tartrate, morphine injection, mouth rinse  Physical Exam Vitals and nursing note reviewed.  Constitutional:      General: She is not in acute distress.    Appearance: She is ill-appearing.  Pulmonary:     Effort: No respiratory distress.  Skin:    General: Skin is warm and dry.  Neurological:     Mental Status: She is lethargic.     Motor: Weakness present.  Psychiatric:        Speech: She is noncommunicative.             Vital Signs: BP (!) 145/74 (BP Location: Right Arm)   Pulse 63   Temp 97.7 F (36.5 C) (Oral)   Resp 19   SpO2 94%  SpO2: SpO2: 94 % O2 Device: O2 Device: Room Air O2 Flow Rate: O2 Flow Rate (L/min): 2 L/min (decreased to RA)  Intake/output summary:  Intake/Output Summary (Last 24 hours) at 11/21/2022 1652 Last data filed at 11/21/2022 0900 Gross per 24 hour  Intake 2001.38 ml  Output 3050 ml  Net -1048.62 ml   LBM: Last BM Date : 11/20/22 Baseline Weight:   Most recent weight:         Palliative Assessment/Data: PPS 10%      Patient Active Problem List   Diagnosis Date Noted   Acute respiratory failure 11/16/2022   Abdominal distention 11/16/2022   AMS (altered mental status) 05/17/2022   Altered mental status 05/15/2022   Sacral wound  05/15/2022   Intracranial hemorrhage    Pressure injury of skin 02/18/2022   Chronic indwelling Foley catheter 02/18/2022   PAF (paroxysmal atrial fibrillation) 02/18/2022   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/18/2022   Septic shock 02/15/2022   Constipation    Abdominal pain    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   Right hemiplegia 04/12/2020   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   Hyperlipidemia associated with type 2 diabetes mellitus 07/30/2018   History of cerebral infarction 07/30/2018   Vitamin B 12 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/30/2018   Asthma 03/13/2018   Seizures 01/12/2018   CVA (cerebral vascular accident) 12/08/2017   Abnormal thyroid blood test 12/08/2017   Pulmonary embolism and infarction 12/08/2017   Anemia 12/08/2017   Expressive aphasia 10/15/2017   Seizure disorder 10/15/2017   Dysphagia 10/15/2017   Diabetes mellitus without complication    Hyperlipidemia 09/22/2017   Urinary frequency 09/22/2017   Airway hyperreactivity 01/26/2016   Disease of thyroid gland 01/26/2016   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   Other specified postprocedural states 01/26/2016   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   Chronic LBP 04/10/2015   Degeneration of intervertebral disc of lumbar region 04/10/2015   Spondylolisthesis of lumbar region 04/10/2015   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   History of cardiac pacemaker in situ 01/09/2015   B12 DEFICIENCY 08/15/2008   UNSPECIFIED VITAMIN D DEFICIENCY 08/15/2008   SINUS BRADYCARDIA 08/15/2008   OBSTRUCTIVE SLEEP APNEA 08/11/2008   INSOMNIA 04/28/2008   Hyperlipidemia LDL goal <70 12/12/2006   RHINITIS, ALLERGIC NEC 12/12/2006   Hypothyroidism 12/11/2006   Essential hypertension 12/11/2006   GERD 12/11/2006   STRESS INCONTINENCE 12/11/2006    Palliative Care Assessment & Plan   Patient Profile: 72 y.o. female  with past medical history of DM2, HTN, HLD, CVA in 2018 with  subsequent right hemiplegia and expressive aphasia with PEG tube and bedbound at  baseline, cognitive communication deficit, history of PE, hypothyroidism, seizure disorder, hypothyroidism, anemia, atrial fibrillation with a pacemaker not on AC due to bleeding presented to ED on 11/16/2022 from [facility] with staff concerns of foul-smelling urine, gurgling respirations, hypoxia.   Assessment: Principal Problem:   Acute respiratory failure Active Problems:   Hypothyroidism   OBSTRUCTIVE SLEEP APNEA   Essential hypertension   STRESS INCONTINENCE   Diabetes mellitus without complication   Expressive aphasia   Seizures   Hyperlipidemia associated with type 2 diabetes mellitus   History of cerebral infarction   Right hemiplegia   PEG (percutaneous endoscopic gastrostomy) status   Abdominal distention   Concern about end of life  Recommendations/Plan: Continue to treat the treatable with watchful waiting.  Considering patient has shown no significant improvements, encouraged family to consider time-limited trial of 24 to 48 hours of continued intervention and then consideration of transition to full comfort care  In the event of decline, family previously indicated they would not want care escalated, but rather transition to comfort measures  Continue DNR/DNI as previously documented Ongoing GOC pending clinical course PMT will continue to follow and support holistically  Goals of Care and Additional Recommendations: Limitations on Scope of Treatment: Full Scope Treatment  Code Status:    Code Status Orders  (From admission, onward)           Start     Ordered   11/16/22 2215  Do not attempt resuscitation (DNR)  Continuous       Question Answer Comment  If patient has no pulse and is not breathing Do Not Attempt Resuscitation   If patient has a pulse and/or is breathing: Medical Treatment Goals LIMITED ADDITIONAL INTERVENTIONS: Use medication/IV fluids and cardiac  monitoring as indicated; Do not use intubation or mechanical ventilation (DNI), also provide comfort medications.  Transfer to Progressive/Stepdown as indicated, avoid Intensive Care.   Consent: Discussion documented in EHR or advanced directives reviewed      11/16/22 2216           Code Status History     Date Active Date Inactive Code Status Order ID Comments User Context   11/16/2022 2042 11/16/2022 2216 DNR [id number]  Rondel Baton, MD ED   11/16/2022 1325 11/16/2022 2042 Full Code [id number]  Rondel Baton, MD ED   05/15/2022 1946 05/18/2022 2321 Full Code [id number]  Tiffany Kocher, DO ED   02/15/2022 1947 02/22/2022 1859 Full Code [id number]  Leslye Peer, MD ED   05/29/2021 1500 06/07/2021 0431 Full Code [id number]  Orland Mustard, MD ED   04/12/2020 0114 04/14/2020 1847 Full Code [id number]  Hillary Bow, DO ED   10/05/2017 1544 10/15/2017 1619 Full Code [id number]  Black, Lesle Chris, NP ED       Prognosis:  Poor  Discharge Planning: To Be Determined  Care plan was discussed with primary RN, patient's daughter/HCPOA  Thank you for allowing the Palliative Medicine Team to assist in the care of this patient.   Haskel Khan, NP  Please contact Palliative Medicine Team phone at [phone] for questions and concerns.   *Portions of this note are a verbal dictation therefore any spelling and/or grammatical errors are due to the "[employer]agon [employer] One" system interpretation.

## 2022-11-21 NOTE — Progress Notes (Signed)
PROGRESS NOTE Kathleen Jensen  NWG:[id number] DOB: 1950/08/01 DOA: 11/16/2022 PCP: Patient, No Pcp Per  Brief Narrative/Hospital Course: 47 yof w/ right-sided hemiplegia and expressive aphasia secondary to a stroke in 2018,of A-fib, pacemaker in place, seizures, HTN, DM, hypothyroidism, anemia w/ a PEG tube in place for approximately the last year due to poor p.o. intake who had a stepwise decline over the last year following sepsis from UTI and other infectious processes, has indwelling Foley as well and is resident of [facility] usually who is unable to talk for the last 4 months presented with possible aspiration. She was noted to have gurgling on respirations that came in place this morning and foul-smelling urine. EMS brought her to the hospital she required nonrebreather. She was then weaned to 3 L via nasal cannula and then aspirated some more while in the ED despite not having any thing by mouth or any tube feeds going and advanced again back to nonrebreather to support her respirations. In the ED palliative care came and saw the patient had a long lengthy conversation with the family. Given her worsening course the family decided to make her DNR/DNI but still wanted aggressive medical treatment. She is being managed medically with antibiotics, supportive care Palliative has been following closely    Subjective:  Past 24 hrs patient has been afebrile Remains on nasal cannula oxygen, BP stable but has been tachycardic this morning Labs showed stable CBC and creatinine with sodium 131 BM last 4/24 Patient is moaning some Chest x-ray reviewed x-ray result pending this am   Assessment and Plan: Principal Problem:   Acute respiratory failure Active Problems:   Seizures   Hypothyroidism   OBSTRUCTIVE SLEEP APNEA   Essential hypertension   STRESS INCONTINENCE   Diabetes mellitus without complication   Expressive aphasia   Hyperlipidemia associated with type 2 diabetes  mellitus   History of cerebral infarction   Right hemiplegia   PEG (percutaneous endoscopic gastrostomy) status   Abdominal distention    Acute respiratory failure with hypoxia Aspiration pneumonia in the setting of expressive aphasia and previous history of: initially needed NRB transitioned to 3 Stewart, Hypoxia secondary to aspiration pneumonitis.Continue Unasyn through 4/26. Tachycardic this morning but BP stable, afebrile no leukocytosis. Palliative care following for ongoing goals of care:plan to continue with treatment with watchful waiting, and no escalation of care if she declines and possible transition to comfort measure.  Recheck chest x-ray with bibasilar atelectasis.   Seizure disorder cont Depakote and Dilantin via PEG.   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 Abdomen appears distended, continue Dulcolax suppository. she is on unasyn as above. X-ray abdomen reviewed shows persistent diffuse gaseous distention of large and small bowel loops consistent with ileus    H/o CVA W/ right-sided hemiplegia and expressive aphasia non verbal holding Tube Feed due to aspiration pneumonia/abdominal distention.  PAF w/ RVR: Having tachycardia at this morning, continue on amiodarone, increase metoprolol  15.5> 25 mg bid and monitor.Not on AC due ot bleeding per chart.   DM w/ hypoglycemia Tube feeds on hold for aspiration pneumonia.  Currently on dextrose fluids and blood sugar remains controlled 147 this am    Essential hypertension:Well controlled on metoprolol   Hypothyroidism:TSH wnl.Has been off her meds recently   Hyponatremia: Monitor   Multiple electrolyte imbalance  with severe hypokalemia hypomagnesemia and  hypophosphatemia: Labs are improved after replacement.Cont to monitor and replete electrolytes.   Recent Labs  Lab 11/16/22 1411 11/16/22 1418 11/18/22 1843 11/20/22 0708 11/20/22 2030 11/21/22 0025  K 4.6 4.7 3.2* 2.4* 4.4 4.2  CALCIUM 8.1*  --  8.0* 7.2* 7.7* 7.4*  MG  --   --   --  1.2*  --  2.0  PHOS  --   --   --  1.7*  --  3.0   GOC:DNR.Patient remains seriously ill with complex comorbidities,at risk of readmission,decompensation.Palliative care following, continue on current care with no escalation of care and possible transition to comfort measures awaiting further family meeting and discussion.  DVT prophylaxis: SCDs Start: 11/16/22 2214 Code Status:   Code Status: DNR Family Communication:No family at the bedside.Palliative care following>discussed 4/24  Patient status is: Inpatient because of aspiration pneumonia Level of care: Progressive  Dispo:The patient is from:[facility]            Anticipated disposition:TBD  Objective: Vitals last 24 hrs: Vitals:   11/20/22 1958 11/20/22 2306 11/21/22 0320 11/21/22 0727  BP: (!) 119/95 (!) 141/88 130/89 (!) 138/105  Pulse: (!) 111 95 96 (!) 223  Resp: 13 15 17 15   Temp: (!) 97.5 F (36.4 C) 98.3 F (36.8 C) 97.7 F (36.5 C) 97.7 F (36.5 C)  TempSrc: Axillary Axillary Axillary Axillary  SpO2: 100% 96% 99% 98%   Weight change:   Physical Examination: General exam: Moaning HEENT:Oral mucosa dry, Ear/Nose WNL grossly, dentition normal. Respiratory system: bilaterally diminished BS,no use of accessory muscle Cardiovascular system: S1 & S2 +, regular rate. Gastrointestinal system: Abdomen soft, it is distended, PEG+,BS inaudible Nervous System:Alert, awake,moaning Extremities: LE ankle edema +,lower extremities warm Skin: No rashes,no icterus. MSK: weak muscle bulk,tone, power. Foley++  Medications reviewed:  Scheduled Meds:  acetaminophen  640 mg Per Tube Q8H   amiodarone  200 mg Per Tube Daily   aspirin  81 mg Per  Tube QHS   Chlorhexidine Gluconate Cloth  6 each Topical Daily   leptospermum manuka honey  1 Application Topical Daily   metoprolol tartrate  12.5 mg Per Tube BID   mouth rinse  15 mL Mouth Rinse 4 times per day   phenytoin  175 mg Per Tube BID   valproic acid  500 mg Per Tube QID   Continuous Infusions:  ampicillin-sulbactam (UNASYN) IV 3 g (11/21/22 0617)   dextrose 5 % and 0.9% NaCl 100 mL/hr at 11/20/22 2158   Diet Order             Diet NPO time specified  Diet effective now                  Intake/Output Summary (Last 24 hours) at 11/21/2022 0753 Last data filed at 11/21/2022 0320 Gross per 24 hour  Intake 1102.26 ml  Output 2200 ml  Net -1097.74 ml    Net IO Since Admission: -1,853.65 mL [11/21/22 0753]  Wt Readings from Last 3 Encounters:  05/16/22 82 kg  02/21/22 81.2 kg  05/29/21 78.3 kg     Unresulted Labs (From admission, onward)     Start     Ordered   11/21/22 0500  Basic metabolic panel  Daily,   R     Question:  Specimen collection method  Answer:  Lab=Lab collect   11/20/22 1037   11/21/22 0500  CBC  Daily,   R     Question:  Specimen collection  method  Answer:  Lab=Lab collect   11/20/22 1037          Data Reviewed: I have personally reviewed following labs and imaging studies CBC: Recent Labs  Lab 11/16/22 1411 11/16/22 1418 11/18/22 1843 11/20/22 0708 11/21/22 0025  WBC 10.3  --  6.0 6.1 5.9  NEUTROABS 5.6  --  2.8  --   --   HGB 12.5 13.3 12.9 12.7 12.8  HCT 37.2 39.0 39.0 39.3 39.2  MCV 92.5  --  94.2 93.6 93.6  PLT 202  --  239 163 170    Basic Metabolic Panel: Recent Labs  Lab 11/16/22 1411 11/16/22 1418 11/18/22 1843 11/20/22 0708 11/20/22 2030 11/21/22 0025  NA 129* 131* 134* 132* 135 131*  K 4.6 4.7 3.2* 2.4* 4.4 4.2  CL 90*  --  95* 97* 101 97*  CO2 31  --  GLUCOSE 75  --  108* 130* 107* 147*  BUN 20  --  5* <5* <5* <5*  CREATININE 0.44  --  0.37* 0.38* 0.39* 0.36*  CALCIUM 8.1*  --  8.0* 7.2*  7.7* 7.4*  MG  --   --   --  1.2*  --  2.0  PHOS  --   --   --  1.7*  --  3.0    GFR: CrCl cannot be calculated (Unknown ideal weight.). Liver Function Tests: Recent Labs  Lab 11/16/22 1411 11/20/22 0708  AST 48* 37  ALT 19 16  ALKPHOS 134* 100  BILITOT 0.5 0.6  PROT 8.1 7.1  ALBUMIN 1.9* 1.6*    No results for input(s): "LIPASE", "AMYLASE" in the last 168 hours. Recent Labs  Lab 11/16/22 1411  AMMONIA 43*   CBG:  Recent Results (from the past 240 hour(s))  Resp panel by RT-PCR (RSV, Flu A&B, Covid) Anterior Nasal Swab     Status: None   Collection Time: 11/16/22  2:11 PM   Specimen: Anterior Nasal Swab  Result Value Ref Range Status   SARS Coronavirus 2 by RT PCR NEGATIVE NEGATIVE Final   Influenza A by PCR NEGATIVE NEGATIVE Final   Influenza B by PCR NEGATIVE NEGATIVE Final    Comment: (NOTE) The Xpert Xpress SARS-CoV-2/FLU/RSV plus assay is intended as an aid in the diagnosis of influenza from Nasopharyngeal swab specimens and should not be used as a sole basis for treatment. Nasal washings and aspirates are unacceptable for Xpert Xpress SARS-CoV-2/FLU/RSV testing.  Fact Sheet for Patients: BloggerCourse.com  Fact Sheet for Healthcare Providers: SeriousBroker.it  This test is not yet approved or cleared by the [place] FDA and has been authorized for detection and/or diagnosis of SARS-CoV-2 by FDA under an Emergency Use Authorization (EUA). This EUA will remain in effect (meaning this test can be used) for the duration of the COVID-19 declaration under Section 564(b)(1) of the Act, 21 U.S.C. section 360bbb-3(b)(1), unless the authorization is terminated or revoked.     Resp Syncytial Virus by PCR NEGATIVE NEGATIVE Final    Comment: (NOTE) Fact Sheet for Patients: BloggerCourse.com  Fact Sheet for Healthcare Providers: SeriousBroker.it  This test  is not yet approved or cleared by the [place] FDA and has been authorized for detection and/or diagnosis of SARS-CoV-2 by FDA under an Emergency Use Authorization (EUA). This EUA will remain in effect (meaning this test can be used) for the duration of the COVID-19 declaration under Section 564(b)(1) of the Act, 21 U.S.C. section 360bbb-3(b)(1), unless the authorization is  terminated or revoked.  Performed at [facility], [address], [place], [place] [address]   Urine Culture (for pregnant, neutropenic or urologic patients or patients with an indwelling urinary catheter)     Status: Abnormal   Collection Time: 11/18/22  6:41 PM   Specimen: Urine, Clean Catch  Result Value Ref Range Status   Specimen Description URINE, CLEAN CATCH  Final   Special Requests NONE  Final   Culture (A)  Final    <10,000 COLONIES/mL INSIGNIFICANT GROWTH Performed at [facility], [address] [place], [place] [address]    Report Status 11/19/2022 FINAL  Final    Antimicrobials: Anti-infectives (From admission, onward)    Start     Dose/Rate Route Frequency Ordered Stop   11/17/22 1030  Ampicillin-Sulbactam (UNASYN) 3 g in sodium chloride 0.9 % 100 mL IVPB        3 g 200 mL/hr over 30 Minutes Intravenous Every 6 hours 11/17/22 1035 11/22/22 0444   11/16/22 2045  Ampicillin-Sulbactam (UNASYN) 3 g in sodium chloride 0.9 % 100 mL IVPB        3 g 200 mL/hr over 30 Minutes Intravenous  Once 11/16/22 2043 11/16/22 2137      Culture/Microbiology    Component Value Date/Time   SDES URINE, CLEAN CATCH 11/18/2022 1841   SPECREQUEST NONE 11/18/2022 1841   CULT (A) 11/18/2022 1841    <10,000 COLONIES/mL INSIGNIFICANT GROWTH Performed at [facility], [address], [place], [place] [address]    REPTSTATUS 11/19/2022 FINAL 11/18/2022 1841   ther culture-see note  Radiology Studies: CT ABDOMEN PELVIS WO CONTRAST  Result Date: 11/19/2022 CLINICAL DATA:  Suspected bowel  obstruction EXAM: CT ABDOMEN AND PELVIS WITHOUT CONTRAST TECHNIQUE: Multidetector CT imaging of the abdomen and pelvis was performed following the standard protocol without IV contrast. Unenhanced CT was performed per clinician order. Lack of IV contrast limits sensitivity and specificity, especially for evaluation of abdominal/pelvic solid viscera. RADIATION DOSE REDUCTION: This exam was performed according to the departmental dose-optimization program which includes automated exposure control, adjustment of the mA and/or kV according to patient size and/or use of iterative reconstruction technique. COMPARISON:  11/18/2022, 02/17/2022 FINDINGS: Lower chest: Trace bilateral pleural effusions, left greater than right. No acute airspace disease. Dual lead cardiac pacer is noted. Hepatobiliary: Unremarkable unenhanced appearance of the liver. Prior cholecystectomy. Pancreas: Unremarkable unenhanced appearance. Spleen: Unremarkable unenhanced appearance. Adrenals/Urinary Tract: Interval enlargement of a simple cyst seen on the prior CT, now measuring 4.4 cm with increased internal attenuation measuring up to 39 HU. This could reflect proteinaceous or hemorrhagic material within the cyst. No urinary tract calculi or obstructive uropathy. The adrenals are unremarkable. The bladder is decompressed with an indwelling Foley catheter. Stomach/Bowel: There is circumferential segmental rectal wall thickening extending to the anal verge, with mild perirectal fat stranding. Rectal wall measures up to 8 mm in thickness reference image 78/3. Findings may reflect proctitis. There is diffuse colonic distension, with gas fluid levels throughout the colon consistent with diarrhea or recent laxative/enema use. No evidence of small-bowel obstruction. Postsurgical changes are seen from prior bariatric surgery. There is a percutaneous enteric catheter within the left upper quadrant, likely within the jejunum. Position unchanged since prior  exam. Vascular/Lymphatic: Aortic atherosclerosis. No enlarged abdominal or pelvic lymph nodes. Reproductive: Stable calcified uterine fibroids.  No adnexal masses. Other: No free fluid or free intraperitoneal gas. Small fat containing periumbilical ventral hernia unchanged. No bowel herniation. Musculoskeletal: No acute or  destructive bony lesions. Postsurgical changes within the lower lumbar spine. Reconstructed images demonstrate no additional findings. IMPRESSION: 1. Segmental circumferential wall thickening involving the distal rectum, most compatible with proctitis. Close follow-up is recommended to document resolution and exclude underlying neoplasm. 2. Diffuse colonic distension, with gas fluid levels identified. This could reflect an element of colonic ileus or functional obstruction due to the inflamed rectum described above. Gas fluid levels may reflect diarrhea or recent laxative/enema use. 3. Trace bilateral pleural effusions, left greater than right. 4.  Aortic Atherosclerosis (ICD10-I70.0). 5. Slightly hyperdense left renal cyst, likely representing hemorrhagic cyst. A simple cyst was seen in this location on prior exams. If further evaluation is clinically indicated, renal ultrasound could be performed. Electronically Signed   By: Sharlet Salina M.D.   On: 11/19/2022 15:43     LOS: 5 days   Lanae Boast, MD [facility]  11/21/2022, 7:53 AM

## 2022-11-21 NOTE — Plan of Care (Signed)
  Problem: Coping: Goal: Level of anxiety will decrease Outcome: Progressing   Problem: Pain Managment: Goal: General experience of comfort will improve Outcome: Progressing   Problem: Safety: Goal: Ability to remain free from injury will improve Outcome: Progressing   

## 2022-11-22 DIAGNOSIS — R401 Stupor: Secondary | ICD-10-CM | POA: Diagnosis not present

## 2022-11-22 DIAGNOSIS — T17908A Unspecified foreign body in respiratory tract, part unspecified causing other injury, initial encounter: Secondary | ICD-10-CM | POA: Diagnosis not present

## 2022-11-22 DIAGNOSIS — Z8673 Personal history of transient ischemic attack (TIA), and cerebral infarction without residual deficits: Secondary | ICD-10-CM | POA: Diagnosis not present

## 2022-11-22 DIAGNOSIS — J9601 Acute respiratory failure with hypoxia: Secondary | ICD-10-CM | POA: Diagnosis not present

## 2022-11-22 LAB — CBC
HCT: 38.6 % (ref 36.0–46.0)
Hemoglobin: 12.7 g/dL (ref 12.0–15.0)
MCH: 30.5 pg (ref 26.0–34.0)
MCHC: 32.9 g/dL (ref 30.0–36.0)
MCV: 92.8 fL (ref 80.0–100.0)
Platelets: 178 10*3/uL (ref 150–400)
RBC: 4.16 MIL/uL (ref 3.87–5.11)
RDW: 15.1 % (ref 11.5–15.5)
WBC: 6.6 10*3/uL (ref 4.0–10.5)
nRBC: 0 % (ref 0.0–0.2)

## 2022-11-22 LAB — BASIC METABOLIC PANEL
Anion gap: 5 (ref 5–15)
BUN: <5 mg/dL — ABNORMAL LOW (ref 8–23)
CO2: 28 mmol/L (ref 22–32)
Calcium: 7.2 mg/dL — ABNORMAL LOW (ref 8.9–10.3)
Chloride: 98 mmol/L (ref 98–111)
Creatinine, Ser: 0.34 mg/dL — ABNORMAL LOW (ref 0.44–1.00)
GFR, Estimated: >60 mL/min (ref >60)
Glucose, Bld: 129 mg/dL — ABNORMAL HIGH (ref 70–99)
Potassium: 3.1 mmol/L — ABNORMAL LOW (ref 3.5–5.1)
Sodium: 131 mmol/L — ABNORMAL LOW (ref 135–145)

## 2022-11-22 MED ORDER — HYDROXYZINE HCL 10 MG PO TABS
10.0000 mg | ORAL_TABLET | Freq: Three times a day (TID) | ORAL | Status: DC
  Administered 2022-11-22 – 2022-12-03 (×34): 10 mg
  Filled 2022-11-22 (×34): qty 1

## 2022-11-22 MED ORDER — VALPROIC ACID 250 MG/5ML PO SOLN
500.0000 mg | Freq: Three times a day (TID) | ORAL | Status: DC
  Administered 2022-11-22 – 2022-12-03 (×30): 500 mg
  Filled 2022-11-22 (×35): qty 10

## 2022-11-22 MED ORDER — LEVETIRACETAM 500 MG PO TABS
1000.0000 mg | ORAL_TABLET | Freq: Two times a day (BID) | ORAL | Status: DC
  Administered 2022-11-22 – 2022-12-03 (×23): 1000 mg
  Filled 2022-11-22 (×23): qty 2

## 2022-11-22 MED ORDER — VALPROIC ACID 250 MG/5ML PO SOLN: 750.0000 mg | Freq: Every day | ORAL | Status: DC

## 2022-11-22 MED ORDER — VALPROIC ACID 250 MG/5ML PO SOLN
750.0000 mg | Freq: Every day | ORAL | Status: DC
  Administered 2022-11-22 – 2022-12-02 (×11): 750 mg
  Filled 2022-11-22 (×15): qty 15

## 2022-11-22 MED ORDER — PHENYTOIN 125 MG/5ML PO SUSP
150.0000 mg | Freq: Two times a day (BID) | ORAL | Status: DC
  Administered 2022-11-23 – 2022-11-25 (×5): 150 mg
  Filled 2022-11-22 (×6): qty 8

## 2022-11-22 MED ORDER — POTASSIUM CHLORIDE 10 MEQ/100ML IV SOLN
10.0000 meq | INTRAVENOUS | Status: AC
  Administered 2022-11-22 (×4): 10 meq via INTRAVENOUS
  Filled 2022-11-22 (×4): qty 100

## 2022-11-22 NOTE — Plan of Care (Signed)

## 2022-11-22 NOTE — Social Work (Signed)
TOC continues to follow for Palliative recommendations for most appropriate next level of care. Please consult if any additional needs arise.

## 2022-11-22 NOTE — Progress Notes (Signed)
Initial Nutrition Assessment  RD working remotely.  DOCUMENTATION CODES:   Not applicable  INTERVENTION:   - RN to obtain bed weight as pt does not have any documented weights from this admission  - If unable to start tube feeds tomorrow as planned, recommend initiation of TPN tomorrow if within GOC as pt has been without nutrition x 7 days.  Tube feeding recommendations: - Start Osmolite 1.2 @ 20 ml/hr and advance rate by 10 ml q 12 hours to goal rate of 65 ml/hr (1560 ml/day) - PROSource TF20 60 ml daily - Free water flushes of 100 ml q 4 hours  Recommended tube feeding regimen at goal rate would provide 1952 kcal, 106.5 grams of protein, and 1279 ml of H2O.   Total free water with recommended flushes: 1879 ml  Once tube feeds are initiated, recommend monitoring magnesium, potassium, and phosphorus q 12 hours for at least 4 occurrences, MD to replete as needed. Pt is at lower risk for refeeding syndrome given pt as been on D5-NS @ 100 ml/hr since 11/16/22. However, pt currently with hypokalemia requiring IV potassium replacement. Pt previously with hypophosphatemia and hypomagnesemia on 11/20/22.  - Once able to start tube feeds, recommend adding 1 packet Juven BID per tube, each packet provides 95 calories, 2.5 grams of protein, and 9.8 grams of carbohydrate; also contains L-arginine and L-glutamine, vitamin C, vitamin E, vitamin B-12, zinc, calcium, and calcium Beta-hydroxy-Beta-methylbutyrate to support wound healing  NUTRITION DIAGNOSIS:   Inadequate oral intake related to inability to eat as evidenced by NPO status.  GOAL:   Patient will meet greater than or equal to 90% of their needs  MONITOR:   Labs, Weight trends, Skin, I & O's  REASON FOR ASSESSMENT:   NPO/Clear Liquid Diet    ASSESSMENT:   72 year old female who presented to the ED on 4/20 from [facility] with foul-smelling urine, gurgling respiration, hypoxia. 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 Pt admitted with acute respiratory failure, SBO versus ileus.  RD working remotely.  Pt has been NPO and without tube feeds infusing since admission on 11/16/22 (7 days) due concern for SBO vs ileus. Most recent abdominal x-ray from yesterday showing persistent gaseous distention of large and small bowel loops consistent with ileus and no bowel wall thickening or definite point of obstruction. Per RN, pt had a bowel movement overnight. Pt with a history of Roux-en-Y gastric bypass surgery in September 2018 and jejunostomy tube placement on 09/17/21. Per MD note, plan to obtain repeat abdominal x-ray tomorrow AM. If abdominal distention is stable/better, will start tube feeding. RD will leave tube feeding recommendations. If unable to start tube feeds tomorrow, recommend initiation of TPN if within GOC as pt has been without nutrition x 7 days.  Palliative Medicine team is following pt regarding GOC.  Once tube feeds are initiated, recommend monitoring magnesium, potassium, and phosphorus q 12 hours for at least 4 occurrences, MD to replete as needed. Pt is at lower risk for refeeding syndrome given pt as been on D5-NS @ 100 ml/hr since 11/16/22. However, pt is currently hypokalemic requiring IV potassium replacement.  Per review of home meds list, it appears pt was previously on Jevity 1.2 @ 65 ml/hr with PROSource TF 45 ml TID and free water flushes of 100 ml q  4 hours. Given recent ileus and no enteral nutrition x 7 days, RD recommends starting tube feeds back with a fiber-free formula.  Discussed pt with RN who will obtain bed weight when able. Pt with no documented weights this admission. Will estimate needs and provide tube feeding recommendations based on previous weights  and based on IBW.  Noted pt with mild pitting generalized edema, non-pitting edema to BUE, and mild pitting edema to BLE. Edema may be causing weight to appear falsely elevated.  Medications reviewed and include: phenytoin 175 mg BID per tube, IV abx, IV KCl 10 mEq x 4 IVF: D5-NS @ 100 ml/hr (provides 408 kcal daily from dextrose)  Labs reviewed: sodium 131, potassium 3.1, phosphorus 3.0 on 4/25, magnesium 2.0 on 4/25  UOP: 2375 ml x 24 hours I/O's: -1.1 L since admit  NUTRITION - FOCUSED PHYSICAL EXAM:  Unable to complete at this time. RD working remotely.  Diet Order:   Diet Order             Diet NPO time specified  Diet effective now                   EDUCATION NEEDS:   Not appropriate for education at this time  Skin:  Skin Assessment: Skin Integrity Issues: Unstageable: sacrum (per WOC note)  Last BM:  11/20/22  Height:   Ht Readings from Last 1 Encounters:  05/16/22 5\' 5"  (1.651 m)    Weight:   Wt Readings from Last 1 Encounters:  05/16/22 82 kg    Ideal Body Weight:  56.8 kg  BMI:  There is no height or weight on file to calculate BMI.  Estimated Nutritional Needs:   Kcal:  1900-2100  Protein:  100-120 grams  Fluid:  >1.9 L    Mertie Clause, MS, RD, LDN Inpatient Clinical Dietitian Please see AMiON for contact information.

## 2022-11-22 NOTE — Progress Notes (Addendum)
PROGRESS NOTE Jinnie Onley  ZOX:[id number] DOB: 1951/01/16 DOA: 11/16/2022 PCP: Patient, No Pcp Per  Brief Narrative/Hospital Course: 67 yof w/ right-sided hemiplegia and expressive aphasia secondary to a stroke in 2018,of A-fib, pacemaker in place, seizures, HTN, DM, hypothyroidism, anemia w/ a PEG tube in place for approximately the last year due to poor p.o. intake who had a stepwise decline over the last year following sepsis from UTI and other infectious processes, has indwelling Foley as well and is resident of [facility] usually who is unable to talk for the last 4 months presented with possible aspiration. She was noted to have gurgling on respirations that came in place this morning and foul-smelling urine. EMS brought her to the hospital she required nonrebreather. She was then weaned to 3 L via nasal cannula and then aspirated some more while in the ED despite not having any thing by mouth or any tube feeds going and advanced again back to nonrebreather to support her respirations. In the ED palliative care came and saw the patient had a long lengthy conversation with the family. Given her worsening course the family decided to make her DNR/DNI but still wanted aggressive medical treatment. She is being managed medically with antibiotics, supportive care Palliative has been following closely    Subjective: Seen and examined this morning Past 24 hours patient has been feeling Eyes open no other response not much moaning Nurse reports patient had a bowel movement 4 AM this morning  Lab with low potassium  Assessment and Plan: Principal Problem:   Acute respiratory failure (HCC) Active Problems:   Seizures (HCC)   Hypothyroidism   OBSTRUCTIVE SLEEP APNEA   Essential hypertension   STRESS INCONTINENCE   Diabetes mellitus without complication (HCC)   Expressive aphasia   Hyperlipidemia associated with type 2 diabetes mellitus (HCC)   History of cerebral infarction    Right hemiplegia (HCC)   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HCC)   Abdominal distention   Acute respiratory failure with hypoxia Aspiration pneumonia in the setting of expressive aphasia and previous history of: initially needed NRB transitioned to 2l [place] now, respiratory status overall is stable.  Got treated with Unasyn> which is on now for proctitis Remains afebrile, no leukocytosis, BP stable.chest x-ray with bibasilar atelectasis 4/25. Palliative care following closely  Seizure disorder : Stable, cont Depakote and Dilantin - dose adjusted to home dose and resumed her keppra per pharmacy, dilantin level ordered. 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 Cont prn dulcolax.   H/o CVA W/ right-sided hemiplegia and expressive aphasia non verbal:holding Tube Feed due to aspiration pneumonia/abdominal distention.  PAF w/ WJX:BJYNWG tachycardia at this morning, continue on amiodarone, increase metoprolol  15.5> 25 mg bid and monitor.Not on AC due ot bleeding per chart.   DM w/ hypoglycemia Tube feeds on hold 2/2 aspiration pneumonia/ileus.  Currently on dextrose fluids and blood sugar remains controlled 147 this am    Essential hypertension:Well controlled, cont metoprolol   Hypothyroidism:TSH wnl.Has been off her meds recently   Hyponatremia: Monitor   Multiple electrolyte imbalance with severe  hypokalemia hypomagnesemia and  hypophosphatemia: Labs with low potassium bleeding this morning, monitor Recent Labs  Lab 11/18/22 1843 11/20/22 0708 11/20/22 2030 11/21/22 0025 11/22/22 0024  K 3.2* 2.4* 4.4 4.2 3.1*  CALCIUM 8.0* 7.2* 7.7* 7.4* 7.2*  MG  --  1.2*  --  2.0  --   PHOS  --  1.7*  --  3.0  --    GOC:DNR.Patient remains seriously ill with complex comorbidities,at risk of readmission,decompensation.Palliative care following, continue on current care with no escalation of care and possible transition to comfort measures awaiting further family meeting and discussion.  DVT prophylaxis: SCDs Start: 11/16/22 2214 Code Status:   Code Status: DNR Family Communication:No family at the bedside.Palliative care following>discussed 4/24 Discussed with nursing staff this morning  Patient status is: Inpatient because of aspiration pneumonia Level of care: Progressive  Dispo:The patient is from:[facility]            Anticipated disposition:TBD  Objective: Vitals last 24 hrs: Vitals:   11/21/22 2000 11/21/22 2323 11/22/22 0342 11/22/22 0743  BP: 114/86 116/80 108/83 123/83  Pulse: 81 67 71 60  Resp: 16 15 18 17   Temp:  97.6 F (36.4 C) (!) 97.5 F (36.4 C) 98.4 F (36.9 C)  TempSrc:  Axillary Axillary Oral  SpO2: 97% 98% 99% 98%   Weight change:   Physical Examination: General exam: Alert, aphasic, morning less today, on nasal cannula oxygen  HEENT:Oral mucosa moist, Ear/Nose WNL grossly, dentition normal. Respiratory system: bilaterally clear BS, no use of accessory muscle Cardiovascular system: S1 & S2 +, regular rate. Gastrointestinal system: Abdomen soft, mild to moderate distention, PEG+ NT,BS+ Nervous System:Alert, awake, DOES NOT follow commands Extremities: LE ankle edema NEG, lower extremities warm Skin: No rashes,no icterus. MSK: Normal muscle bulk,tone, power  Foley++  Medications reviewed:  Scheduled Meds:  acetaminophen  640 mg Per Tube Q8H   amiodarone  200 mg Per Tube Daily   aspirin   81 mg Per Tube QHS   Chlorhexidine Gluconate Cloth  6 each Topical Daily   leptospermum manuka honey  1 Application Topical Daily   metoprolol tartrate  25 mg Per Tube BID   mouth rinse  15 mL Mouth Rinse 4 times per day   phenytoin  175 mg Per Tube BID   simvastatin  10 mg Per Tube QHS   valproic acid  500 mg Per Tube QID   Continuous Infusions:  ampicillin-sulbactam (UNASYN) IV 3 g (11/22/22 0351)   dextrose 5 % and 0.9% NaCl 100 mL/hr at 11/22/22 0400   potassium chloride     Diet Order             Diet NPO time specified  Diet effective now                  Intake/Output Summary (Last 24 hours) at 11/22/2022 0801 Last data filed at 11/22/2022 0400 Gross per 24 hour  Intake 3119.99 ml  Output 2375 ml  Net 744.99 ml    Net IO Since Admission: -1,108.66 mL [11/22/22 0801]  Wt Readings from Last 3 Encounters:  05/16/22 82 kg  02/21/22 81.2 kg  05/29/21 78.3 kg     Unresulted Labs (From admission, onward)     Start     Ordered   11/21/22 0500  Basic metabolic panel  Daily,   R     Question:  Specimen collection method  Answer:  Lab=Lab collect   11/20/22 1037   11/21/22 0500  CBC  Daily,   R     Question:  Specimen collection method  Answer:  Lab=Lab collect   11/20/22 1037          Data Reviewed: I have personally reviewed following labs and imaging studies CBC: Recent Labs  Lab 11/16/22 1411 11/16/22 1418 11/18/22 1843 11/20/22 0708 11/21/22 0025 11/22/22 0024  WBC 10.3  --  6.0 6.1 5.9 6.6  NEUTROABS 5.6  --  2.8  --   --   --   HGB 12.5 13.3 12.9 12.7 12.8 12.7  HCT 37.2 39.0 39.0 39.3 39.2 38.6  MCV 92.5  --  94.2 93.6 93.6 92.8  PLT 202  --  239 163 170 178    Basic Metabolic Panel: Recent Labs  Lab 11/18/22 1843 11/20/22 0708 11/20/22 2030 11/21/22 0025 11/22/22 0024  NA 134* 132* 135 131* 131*  K 3.2* 2.4* 4.4 4.2 3.1*  CL 95* 97* 101 97* 98  CO2 31 30 28 28 28   GLUCOSE 108* 130* 107* 147* 129*  BUN 5* <5* <5* <5* <5*   CREATININE 0.37* 0.38* 0.39* 0.36* 0.34*  CALCIUM 8.0* 7.2* 7.7* 7.4* 7.2*  MG  --  1.2*  --  2.0  --   PHOS  --  1.7*  --  3.0  --     GFR: CrCl cannot be calculated (Unknown ideal weight.). Liver Function Tests: Recent Labs  Lab 11/16/22 1411 11/20/22 0708  AST 48* 37  ALT 19 16  ALKPHOS 134* 100  BILITOT 0.5 0.6  PROT 8.1 7.1  ALBUMIN 1.9* 1.6*    No results for input(s): "LIPASE", "AMYLASE" in the last 168 hours. Recent Labs  Lab 11/16/22 1411  AMMONIA 43*   CBG:  Recent Results (from the past 240 hour(s))  Resp panel by RT-PCR (RSV, Flu A&B, Covid) Anterior Nasal Swab     Status: None   Collection Time: 11/16/22  2:11 PM   Specimen: Anterior Nasal Swab  Result Value Ref Range Status   SARS Coronavirus 2 by RT PCR NEGATIVE NEGATIVE Final   Influenza A by PCR NEGATIVE NEGATIVE Final   Influenza B by PCR NEGATIVE NEGATIVE Final    Comment: (NOTE) The Xpert Xpress SARS-CoV-2/FLU/RSV plus assay is intended as an aid in the diagnosis of influenza from Nasopharyngeal swab specimens and should not be used as a sole basis for treatment. Nasal washings and aspirates are unacceptable for Xpert Xpress SARS-CoV-2/FLU/RSV testing.  Fact Sheet for Patients: BloggerCourse.com  Fact Sheet for Healthcare Providers: SeriousBroker.it  This test is not yet approved or cleared by the [place] FDA and has been authorized for detection and/or diagnosis of SARS-CoV-2 by FDA under an Emergency Use Authorization (EUA). This EUA will remain in effect (meaning this test can be used) for the duration of the COVID-19 declaration under Section 564(b)(1) of the Act, 21 U.S.C. section 360bbb-3(b)(1), unless the authorization is terminated or revoked.     Resp Syncytial Virus by PCR NEGATIVE NEGATIVE Final    Comment: (NOTE) Fact Sheet for Patients: BloggerCourse.com  Fact Sheet for Healthcare  Providers: SeriousBroker.it  This test is not yet approved or cleared by the [place] FDA and has been authorized for detection and/or diagnosis of SARS-CoV-2 by FDA under an Emergency Use Authorization (EUA). This EUA will remain in effect (meaning this test can be used) for the duration of the COVID-19 declaration under Section 564(b)(1) of the Act, 21 U.S.C. section 360bbb-3(b)(1), unless the authorization is terminated or revoked.  Performed at  [facility], [address], [place], [place] [address]   Urine Culture (for pregnant, neutropenic or urologic patients or patients with an indwelling urinary catheter)     Status: Abnormal   Collection Time: 11/18/22  6:41 PM   Specimen: Urine, Clean Catch  Result Value Ref Range Status   Specimen Description URINE, CLEAN CATCH  Final   Special Requests NONE  Final   Culture (A)  Final    <10,000 COLONIES/mL INSIGNIFICANT GROWTH Performed at [facility], [address] [place], [place] [address]    Report Status 11/19/2022 FINAL  Final    Antimicrobials: Anti-infectives (From admission, onward)    Start     Dose/Rate Route Frequency Ordered Stop   11/17/22 1030  Ampicillin-Sulbactam (UNASYN) 3 g in sodium chloride 0.9 % 100 mL IVPB        3 g 200 mL/hr over 30 Minutes Intravenous Every 6 hours 11/17/22 1035 11/27/22 0444   11/16/22 2045  Ampicillin-Sulbactam (UNASYN) 3 g in sodium chloride 0.9 % 100 mL IVPB        3 g 200 mL/hr over 30 Minutes Intravenous  Once 11/16/22 2043 11/16/22 2137      Culture/Microbiology    Component Value Date/Time   SDES URINE, CLEAN CATCH 11/18/2022 1841   SPECREQUEST NONE 11/18/2022 1841   CULT (A) 11/18/2022 1841    <10,000 COLONIES/mL INSIGNIFICANT GROWTH Performed at [facility], [address], [place], [place] [address]    REPTSTATUS 11/19/2022 FINAL 11/18/2022 1841   ther culture-see note  Radiology Studies: DG Chest Port 1  View  Result Date: 11/21/2022 CLINICAL DATA:  Ileus, pneumonia EXAM: PORTABLE CHEST 1 VIEW COMPARISON:  Portable exam 0604 hours compared to 11/16/2022 FINDINGS: LEFT subclavian sequential pacemaker leads project over RIGHT atrium and RIGHT ventricle. Mild enlargement of cardiac silhouette. Mediastinal contours and pulmonary vascularity normal. Atherosclerotic calcification aorta. Minimal bibasilar atelectasis. Lungs otherwise clear without infiltrate, pleural effusion, or pneumothorax. IMPRESSION: Minimal bibasilar atelectasis. Aortic Atherosclerosis (ICD10-I70.0). Electronically Signed   By: Ulyses Southward M.D.   On: 11/21/2022 08:51   DG Abd Portable 1V  Result Date: 11/21/2022 CLINICAL DATA:  Ileus EXAM: PORTABLE ABDOMEN - 1 VIEW COMPARISON:  Portable exam 0607 hours compared to 11/18/2022 FINDINGS: Tip of tube projects over mid abdomen. Persistent gaseous distention of large and small bowel loops consistent with ileus. No bowel wall thickening or definite point of obstruction. Radiopaque density again identified in the RIGHT lower quadrant, uncertain etiology. Marked osseous demineralization. IMPRESSION: Persistent diffuse gaseous distention of large and small bowel loops consistent with ileus. Electronically Signed   By: Ulyses Southward M.D.   On: 11/21/2022 08:49     LOS: 6 days   Lanae Boast, MD [facility]  11/22/2022, 8:01 AM

## 2022-11-22 NOTE — Progress Notes (Addendum)
Daily Progress Note   Patient Name: Kathleen Jensen       Date: 11/22/2022 DOB: 08-22-1950  Age: 72 y.o. MRN#: [id number] Attending Physician: Lanae Boast, MD Primary Care Physician: Patient, No Pcp Per Admit Date: 11/16/2022  Reason for Consultation/Follow-up: Establishing goals of care  Subjective: I have reviewed medical records including EPIC notes, MAR, and labs. Received report from primary RN - no acute concerns; however, RN feels that patient overall is not doing well.  Validated RN's concerns and provided updates regarding previous detailed goals of care conversations with family.  RN reports that patient did have a very small bowel movement this morning.  RN notes that patient seems uncomfortable and moans with staff touch her to provide care.  Went to visit patient at bedside -no family/visitors present.  Patient was lying in bed asleep -she does briefly open her eyes to voice/gentle touch but quickly falls back asleep.  She does moan intermittently.  Called daughter/Kathleen Jensen -emotional support provided.  Provided updates per RN and my assessment today.  Informed her that patient did have a very small bowel movement this morning and the plan is for follow-up x-ray tomorrow.  If x-ray shows improvement/stability plan would be for restarting tube feeds. 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  She is aware that PMT will continue to follow with patient but may not be in contact every day as goals are clear; though, if she has questions or concerns encouraged to call.  All questions and concerns addressed. PMT number  previously provided.  Length of Stay: 6  Current Medications: Scheduled Meds:   acetaminophen  640 mg Per Tube Q8H   amiodarone  200 mg Per Tube Daily   aspirin  81 mg Per Tube QHS   Chlorhexidine Gluconate Cloth  6 each Topical Daily   hydrOXYzine  10 mg Per Tube TID   leptospermum manuka honey  1 Application Topical Daily   levETIRAcetam  1,000 mg Per Tube BID   metoprolol tartrate  25 mg Per Tube BID   mouth rinse  15 mL Mouth Rinse 4 times per day  phenytoin  150 mg Per Tube BID   simvastatin  10 mg Per Tube QHS   valproic acid  500 mg Per Tube TID   valproic acid  750 mg Per Tube QHS    Continuous Infusions:  ampicillin-sulbactam (UNASYN) IV 3 g (11/22/22 1336)   dextrose 5 % and 0.9% NaCl Stopped (11/22/22 1510)    PRN Meds: HYDROcodone-acetaminophen, lip balm, metoprolol tartrate, morphine injection, mouth rinse  Physical Exam Vitals and nursing note reviewed.  Constitutional:      General: She is not in acute distress.    Appearance: She is ill-appearing.  Pulmonary:     Effort: No respiratory distress.  Skin:    General: Skin is warm and dry.  Neurological:     Mental Status: She is lethargic.     Motor: Weakness present.  Psychiatric:        Speech: She is noncommunicative.             Vital Signs: BP (!) 128/110 (BP Location: Right Wrist)   Pulse 60   Temp 98.4 F (36.9 C) (Oral)   Resp 14   Wt 93.3 kg   SpO2 99%   BMI 34.23 kg/m  SpO2: SpO2: 99 % O2 Device: O2 Device: Nasal Cannula O2 Flow Rate: O2 Flow Rate (L/min): 2 L/min  Intake/output summary:  Intake/Output Summary (Last 24 hours) at 11/22/2022 1612 Last data filed at 11/22/2022 1510 Gross per 24 hour  Intake 4216.79 ml  Output 1525 ml  Net 2691.79 ml   LBM: Last BM Date : 11/21/22 Baseline Weight: Weight: 93.3 kg Most recent weight: Weight: 93.3 kg       Palliative Assessment/Data: PPS 10%      Patient Active Problem List   Diagnosis Date Noted   Acute respiratory  failure (HCC) 11/16/2022   Abdominal distention 11/16/2022   AMS (altered mental status) 05/17/2022   Altered mental status 05/15/2022   Sacral wound 05/15/2022   Intracranial hemorrhage (HCC)    Pressure injury of skin 02/18/2022   Chronic indwelling Foley catheter 02/18/2022   PAF (paroxysmal atrial fibrillation) (HCC) 02/18/2022   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HCC) 02/18/2022   Septic shock (HCC) 02/15/2022   Constipation    Abdominal pain    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   Right hemiplegia (HCC) 04/12/2020   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   Hyperlipidemia associated with type 2 diabetes mellitus (HCC) 07/30/2018   History of cerebral infarction 07/30/2018   Vitamin B 12 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/30/2018   Asthma 03/13/2018   Seizures (HCC) 01/12/2018   CVA (cerebral vascular accident) (HCC) 12/08/2017   Abnormal thyroid blood test 12/08/2017   Pulmonary embolism and infarction (HCC) 12/08/2017   Anemia 12/08/2017   Expressive aphasia 10/15/2017   Seizure disorder (HCC) 10/15/2017   Dysphagia 10/15/2017   Diabetes mellitus without complication (HCC)    Hyperlipidemia 09/22/2017   Urinary frequency 09/22/2017   Airway hyperreactivity 01/26/2016   Disease of thyroid gland 01/26/2016   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   Other specified postprocedural states 01/26/2016   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   Chronic LBP 04/10/2015   Degeneration of intervertebral disc of lumbar region 04/10/2015   Spondylolisthesis of lumbar region 04/10/2015   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 female  with past medical history of DM2, HTN, HLD, CVA in 2018 with subsequent right hemiplegia and expressive aphasia with PEG tube and bedbound at baseline, cognitive communication deficit, history of PE, hypothyroidism, seizure disorder, hypothyroidism, anemia, atrial fibrillation with a pacemaker not on AC due to bleeding presented to ED on 11/16/2022 from [facility] with staff concerns of foul-smelling urine, gurgling respirations, hypoxia.   Assessment: Principal Problem:   Acute respiratory failure (HCC) Active Problems:   Hypothyroidism   OBSTRUCTIVE SLEEP APNEA   Essential hypertension   STRESS INCONTINENCE   Diabetes mellitus without complication (HCC)   Expressive aphasia   Seizures (HCC)   Hyperlipidemia associated with type 2 diabetes mellitus (HCC)   History of cerebral infarction   Right hemiplegia (HCC)   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HCC)   Abdominal distention   Recommendations/Plan: Continue to treat the treatable with watchful waiting In the event of decline, family previously indicated they would not want care escalated, but rather transition to comfort measures  Continue DNR/DNI as previously documented.  If patient becomes medically stable for discharge, HCPOA indicates she will rescind DNR and reinstate full code status Please ensure daughter/HC POA/Kathleen Jensen is called daily with updates PMT will follow peripherally and visit with patient and family incrementally for goals of care discussions as appropriate and based on clinical course  Goals of Care and Additional Recommendations: Limitations on Scope of Treatment: Full Scope Treatment  Code Status:    Code Status Orders  (From admission, onward)           Start     Ordered   11/16/22 2215  Do not attempt resuscitation (DNR)   Continuous       Question Answer Comment  If patient has no pulse and is not breathing Do Not Attempt Resuscitation   If patient has a pulse and/or is breathing: Medical Treatment Goals LIMITED ADDITIONAL INTERVENTIONS: Use medication/IV fluids and cardiac monitoring as indicated; Do not use intubation or mechanical ventilation (DNI), also provide comfort medications.  Transfer to Progressive/Stepdown as indicated, avoid Intensive Care.   Consent: Discussion documented in EHR or advanced directives reviewed      11/16/22 2216           Code Status History     Date Active Date Inactive Code Status Order ID Comments User Context   11/16/2022 2042 11/16/2022 2216 DNR [id number]  Rondel Baton, MD ED   11/16/2022 1325 11/16/2022 2042 Full Code [id number]  Rondel Baton, MD ED   05/15/2022 1946 05/18/2022 2321 Full Code [id number]  Tiffany Kocher, DO ED   02/15/2022 1947 02/22/2022 1859 Full Code [id number]  Leslye Peer, MD ED   05/29/2021 1500 06/07/2021 0431 Full Code [id number]  Orland Mustard, MD ED   04/12/2020 0114 04/14/2020 1847 Full Code [id number]  Hillary Bow, DO ED   10/05/2017 1544 10/15/2017 1619 Full Code [id number]  Gwenyth Bender, NP ED       Prognosis:  Poor long term prognosis in the setting of advanced age, advanced dementia, recurrent hospitalizations, and multiple comorbidities  Discharge Planning: To Be Determined  Care plan was discussed with primary RN, patient's daughter  Thank you for allowing the Palliative Medicine Team  to assist in the care of this patient.   Haskel Khan, NP  Please contact Palliative Medicine Team phone at [phone] for questions and concerns.   *Portions of this note are a verbal dictation therefore any spelling and/or grammatical errors are due to the "[employer]agon [employer] One" system interpretation.

## 2022-11-23 ENCOUNTER — Inpatient Hospital Stay (HOSPITAL_COMMUNITY): Payer: Medicare HMO

## 2022-11-23 ENCOUNTER — Other Ambulatory Visit: Payer: Self-pay

## 2022-11-23 DIAGNOSIS — J9601 Acute respiratory failure with hypoxia: Secondary | ICD-10-CM | POA: Diagnosis not present

## 2022-11-23 LAB — BASIC METABOLIC PANEL
Anion gap: 7 (ref 5–15)
BUN: 5 mg/dL — ABNORMAL LOW (ref 8–23)
CO2: 27 mmol/L (ref 22–32)
Calcium: 7.6 mg/dL — ABNORMAL LOW (ref 8.9–10.3)
Chloride: 101 mmol/L (ref 98–111)
Creatinine, Ser: 0.5 mg/dL (ref 0.44–1.00)
GFR, Estimated: >60 mL/min (ref >60)
Glucose, Bld: 92 mg/dL (ref 70–99)
Potassium: 3.3 mmol/L — ABNORMAL LOW (ref 3.5–5.1)
Sodium: 135 mmol/L (ref 135–145)

## 2022-11-23 LAB — GLUCOSE, CAPILLARY
Glucose-Capillary: 124 mg/dL — ABNORMAL HIGH (ref 70–99)
Glucose-Capillary: 113 mg/dL — ABNORMAL HIGH (ref 70–99)

## 2022-11-23 LAB — CBC
HCT: 37.9 % (ref 36.0–46.0)
Hemoglobin: 13 g/dL (ref 12.0–15.0)
MCH: 31.6 pg (ref 26.0–34.0)
MCHC: 34.3 g/dL (ref 30.0–36.0)
MCV: 92 fL (ref 80.0–100.0)
Platelets: 157 10*3/uL (ref 150–400)
RBC: 4.12 MIL/uL (ref 3.87–5.11)
RDW: 15.5 % (ref 11.5–15.5)
WBC: 8.5 10*3/uL (ref 4.0–10.5)
nRBC: 0 % (ref 0.0–0.2)

## 2022-11-23 LAB — MAGNESIUM: Magnesium: 1.6 mg/dL — ABNORMAL LOW (ref 1.7–2.4)

## 2022-11-23 MED ORDER — PROSOURCE TF20 ENFIT COMPATIBL EN LIQD
60.0000 mL | Freq: Every day | ENTERAL | Status: DC
  Administered 2022-11-23 – 2022-12-02 (×10): 60 mL
  Filled 2022-11-23 (×10): qty 60

## 2022-11-23 MED ORDER — SODIUM CHLORIDE 0.9% FLUSH: 10.0000 mL | INTRAVENOUS | Status: DC | PRN

## 2022-11-23 MED ORDER — MAGNESIUM SULFATE 4 GM/100ML IV SOLN
4.0000 g | Freq: Once | INTRAVENOUS | Status: AC
  Administered 2022-11-23: 4 g via INTRAVENOUS
  Filled 2022-11-23: qty 100

## 2022-11-23 MED ORDER — SODIUM CHLORIDE 0.9% FLUSH
10.0000 mL | Freq: Two times a day (BID) | INTRAVENOUS | Status: DC
  Administered 2022-11-23 – 2022-12-03 (×19): 10 mL

## 2022-11-23 MED ORDER — FREE WATER
100.0000 mL | Status: DC
  Administered 2022-11-23 – 2022-11-25 (×10): 100 mL

## 2022-11-23 MED ORDER — OSMOLITE 1.2 CAL PO LIQD
1000.0000 mL | ORAL | Status: DC
  Administered 2022-11-23: 1000 mL
  Filled 2022-11-23 (×5): qty 1000

## 2022-11-23 MED ORDER — OSMOLITE 1.2 CAL PO LIQD
1000.0000 mL | ORAL | Status: DC
  Filled 2022-11-23: qty 1000

## 2022-11-23 MED ORDER — POTASSIUM CL IN DEXTROSE 5% 20 MEQ/L IV SOLN
20.0000 meq | INTRAVENOUS | Status: DC
  Administered 2022-11-23 (×2): 20 meq via INTRAVENOUS
  Filled 2022-11-23 (×2): qty 1000

## 2022-11-23 NOTE — Progress Notes (Signed)
PROGRESS NOTE   Kathleen Jensen  ZOX:[id number] DOB: 1950-09-12 DOA: 11/16/2022 PCP: Patient, No Pcp Per  Brief Narrative:   72 year old Kathleen Jensen Aphasic 2/2 large intraparenchymal hemorrhage/CVA 2018+ PEG tube placed 2023+ indwelling  Prior admission 04/2022 showed Dilantin toxicity resulting in worsening encephalopathy Sick sinus syndrome + dual-chamber /no anticoagulation 2/2 GI bleed--follows with Dr. Rollen Sox Presented from [facility] with gurgling respirations weeping foul-smelling urine with indwelling Foley-initially Rx 12 L oxygen-concern on admission PNA/?  UTI ABG pCO2 56 pH 7.4  Patient has been seen by palliative care in 05/2021 and again 01/2022 and finally this admission--lengthy conversation was undertaken with the family by palliative care-elected DNR/DNI but still?  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   Hospital-Problem based course  Acute respiratory failure with hypoxia on admission 2/2 aspiration pneumonitis On Unasyn since 4/20-will transition to Augmentin liquid probably in the next 1 day or so and complete a 14-day course given high risk for recurrence I had a pretty realistic conversation with the patient's daughter Kathleen Jensen does understand that this is a life limiting process and that aspiration will continue to occur-they are realistic as a family but want to attempt everything short of heroic and aggressive resuscitation with ventilation and shocks Mentioned-we can give it a try-patient has had the stroke since 2018 and she voices her concerns about the care given at nursing facility and I mention to her that this would probably still recur regardless of 24/7 one-to-one care in terms of aspiration-she will think about this a little bit more  Constipation obstipation with prior Roux-en-Y and partial SBO Some concern for SBO earlier in the hospital stay-she passed a stool in the bed today 4/27 so I  will ask nutritionist to resume the tube feeds slowly and reevaluate  Intraparenchymal hemorrhage 2018+ PEG tube dependent + Foley dependent Complicated by seizures?  Dilantin toxicity previously Continue Depakote 500 3 times daily, 750 at bedtime as well as phenytoin 150 twice daily as well as Keppra 1000 twice daily per tube Monitor  Mild hypokalemia Continues on D5 KCl 20@75  cc/H for replacement Magnesium was low so we are giving 4 g and we will check labs in the morning  Sick sinus syndrome + dual PPM not on AC 2/2 high risk bleed Continue Lopressor 25 twice daily, amiodarone 200 daily monitor on telemetry today and reassess in a.m.  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 for SBO Monitor trends  HTN  Hypothyroid  Hyponatremia/hypokalemia    DVT prophylaxis: SCD Code Status: DNR Family Communication: See my documentation above Disposition:  Status is: Inpatient Remains inpatient appropriate because:   Still ill    Subjective: Cannot obtain ROS-she moans and groans when I lift her legs It appears that she is having stool now  Objective: Vitals:   11/22/22 1905 11/22/22 2259 11/23/22 0304 11/23/22 0734  BP: 91/67 (!) 151/88 104/78 105/69  Pulse: 60 60 60 60  Resp: 20 15 15 16   Temp: 97.6 F (36.4 C) 97.6 F (36.4 C) 97.6 F (36.4 C) (!) 97.5 F (36.4 C)  TempSrc: Oral Oral Oral Axillary  SpO2: 97% 100% 100% 100%  Weight:        Intake/Output Summary (Last 24 hours) at 11/23/2022 0819 Last data filed at 11/23/2022 0735 Gross per 24 hour  Intake 2495.92 ml  Output 1800 ml  Net 695.92 ml   [employer]  11/22/22 1100  Weight: 93.3 kg    Examination:  Ill-appearing black female no distress Chest is clear no wheeze rales rhonchi PEG tube in place S1-S2 slight tachycardia, PPM left upper chest paced rhythm Cannot assess neuro  completely  Data Reviewed: personally reviewed   CBC    Component Value Date/Time   WBC 8.5 11/23/2022 0023   RBC 4.12 11/23/2022 0023   HGB 13.0 11/23/2022 0023   HCT 37.9 11/23/2022 0023   PLT 157 11/23/2022 0023   MCV 92.0 11/23/2022 0023   MCH 31.6 11/23/2022 0023   MCHC 34.3 11/23/2022 0023   RDW 15.5 11/23/2022 0023   LYMPHSABS 2.3 11/18/2022 1843   MONOABS 0.7 11/18/2022 1843   EOSABS 0.1 11/18/2022 1843   BASOSABS 0.0 11/18/2022 1843      Latest Ref Rng & Units 11/23/2022   12:23 AM 11/22/2022   12:24 AM 11/21/2022   12:25 AM  CMP  Glucose 70 - 99 mg/dL 92  161  096   BUN 8 - 23 mg/dL 5  <5  <5   Creatinine 0.44 - 1.00 mg/dL 0.45  4.09  8.11   Sodium 135 - 145 mmol/L 135  131  131   Potassium 3.5 - 5.1 mmol/L 3.3  3.1  4.2   Chloride 98 - 111 mmol/L 101  98  97   CO2 22 - 32 mmol/L 27  28  28    Calcium 8.9 - 10.3 mg/dL 7.6  7.2  7.4      Radiology Studies: No results found.   Scheduled Meds:  acetaminophen  640 mg Per Tube Q8H   amiodarone  200 mg Per Tube Daily   aspirin  81 mg Per Tube QHS   Chlorhexidine Gluconate Cloth  6 each Topical Daily   hydrOXYzine  10 mg Per Tube TID   leptospermum manuka honey  1 Application Topical Daily   levETIRAcetam  1,000 mg Per Tube BID   metoprolol tartrate  25 mg Per Tube BID   mouth rinse  15 mL Mouth Rinse 4 times per day   phenytoin  150 mg Per Tube BID   simvastatin  10 mg Per Tube QHS   valproic acid  500 mg Per Tube TID   valproic acid  750 mg Per Tube QHS   Continuous Infusions:  ampicillin-sulbactam (UNASYN) IV Stopped (11/23/22 0546)   dextrose 5 % with KCl 20 mEq / L 20 mEq (11/23/22 0625)     LOS: 7 days   Time spent: 60  Rhetta Mura, MD [facility] To contact the attending provider between 7A-7P or the covering provider during after hours 7P-7A, please log into the web site [email or web address] and access using universal [place] password for that web site. If you do not have the  password, please call the hospital operator.  11/23/2022, 8:19 AM

## 2022-11-23 NOTE — Plan of Care (Signed)
  Problem: Education: Goal: Knowledge of General Education information will improve Description: Including pain rating scale, medication(s)/side effects and non-pharmacologic comfort measures 11/23/2022 0113 by Charmian Muff, RN Outcome: Progressing 11/23/2022 0003 by Charmian Muff, RN Outcome: Progressing   Problem: Health Behavior/Discharge Planning: Goal: Ability to manage health-related needs will improve 11/23/2022 0113 by Charmian Muff, RN Outcome: Progressing 11/23/2022 0003 by Charmian Muff, RN Outcome: Progressing

## 2022-11-23 NOTE — Plan of Care (Signed)
  Problem: Education: Goal: Knowledge of General Education information will improve Description Including pain rating scale, medication(s)/side effects and non-pharmacologic comfort measures Outcome: Progressing   Problem: Health Behavior/Discharge Planning: Goal: Ability to manage health-related needs will improve Outcome: Progressing   

## 2022-11-23 NOTE — Progress Notes (Signed)
Peripherally Inserted Central Catheter Placement  The IV Nurse has discussed with the patient and/or persons authorized to consent for the patient, the purpose of this procedure and the potential benefits and risks involved with this procedure.  The benefits include less needle sticks, lab draws from the catheter, and the patient may be discharged home with the catheter. Risks include, but not limited to, infection, bleeding, blood clot (thrombus formation), and puncture of an artery; nerve damage and irregular heartbeat and possibility to perform a PICC exchange if needed/ordered by physician.  Alternatives to this procedure were also discussed.  [employer] PICC patient education guide, fact sheet on infection prevention and patient information card has been provided to patient /or left at bedside.  [name] , dtr, gave telephone consent for PICC placement.  PICC Placement Documentation  PICC Single Lumen 11/23/22 Right Brachial 38 cm 0 cm (Active)  Indication for Insertion or Continuance of Line Prolonged intravenous therapies 11/23/22 1200  Exposed Catheter (cm) 0 cm 11/23/22 1200  Site Assessment Clean, Dry, Intact 11/23/22 1200  Line Status Saline locked;Flushed;Blood return noted 11/23/22 1200  Dressing Type Transparent;Securing device 11/23/22 1200  Dressing Status Antimicrobial disc in place;Clean, Dry, Intact 11/23/22 1200  Safety Lock Not Applicable 11/23/22 1200  Line Care Connections checked and tightened 11/23/22 1200  Line Adjustment (NICU/IV Team Only) No 11/23/22 1200  Dressing Intervention New dressing 11/23/22 1200  Dressing Change Due 11/30/22 11/23/22 1200       Elliot Dally 11/23/2022, 12:11 PM

## 2022-11-23 NOTE — Progress Notes (Signed)
Nutrition Brief Note  RD received a consult to initiate enteral nutrition. Please see full assessment note from 11/22/22. Medications and labs reviewed. Pt potassium and magnesium low today. Osmolite 1.2 already ordered, MD ok with RD adjusting order.   Tube feed regimen via J-tube: - Start Osmolite 1.2 @ 20 ml/hr and advance rate by 10 ml q 12 hours to goal rate of 65 ml/hr (1560 ml/day) - PROSource TF20 60 ml daily - Free water flushes of 100 ml q 4 hours Tube feeds at goal provides 1952 kcal, 106.5 gm protein, and 1879 mL total free water daily.   Recommend monitoring magnesium, potassium, and phosphorus q 12 hours for at least 4 occurrences, MD to replete as needed.   RD will continue to follow during admission.    Kirby Crigler RD, LDN Clinical Dietitian See Loretha Stapler for contact information.

## 2022-11-23 NOTE — Progress Notes (Signed)
   Medical records reviewed including progress notes, labs and imaging. Abdominal X-ray today shows minimal changes to diffuse gas distension present throughout the bowel.   HCPOA's goal is to proceed with trial of tube feeds when this has improved, treating the treatable and allowing patient more time for outcomes. Goals of care are clear and family has been encouraged to call with additional needs.   PMT will continue to follow peripherally. Thank you for your referral and allowing PMT to assist in Kathleen Jensen's care.   Richardson Dopp, [facility] Palliative Medicine Team  Team Phone # [phone]   NO CHARGE

## 2022-11-24 DIAGNOSIS — J9601 Acute respiratory failure with hypoxia: Secondary | ICD-10-CM | POA: Diagnosis not present

## 2022-11-24 LAB — GLUCOSE, CAPILLARY
Glucose-Capillary: 113 mg/dL — ABNORMAL HIGH (ref 70–99)
Glucose-Capillary: 120 mg/dL — ABNORMAL HIGH (ref 70–99)
Glucose-Capillary: 141 mg/dL — ABNORMAL HIGH (ref 70–99)
Glucose-Capillary: 66 mg/dL — ABNORMAL LOW (ref 70–99)
Glucose-Capillary: 81 mg/dL (ref 70–99)
Glucose-Capillary: 82 mg/dL (ref 70–99)

## 2022-11-24 LAB — BASIC METABOLIC PANEL
Anion gap: 6 (ref 5–15)
BUN: 5 mg/dL — ABNORMAL LOW (ref 8–23)
CO2: 30 mmol/L (ref 22–32)
Calcium: 7.5 mg/dL — ABNORMAL LOW (ref 8.9–10.3)
Chloride: 102 mmol/L (ref 98–111)
Creatinine, Ser: 0.38 mg/dL — ABNORMAL LOW (ref 0.44–1.00)
GFR, Estimated: >60 mL/min (ref >60)
Glucose, Bld: 106 mg/dL — ABNORMAL HIGH (ref 70–99)
Potassium: 2.9 mmol/L — ABNORMAL LOW (ref 3.5–5.1)
Sodium: 138 mmol/L (ref 135–145)

## 2022-11-24 LAB — CBC
HCT: 34.8 % — ABNORMAL LOW (ref 36.0–46.0)
Hemoglobin: 11.4 g/dL — ABNORMAL LOW (ref 12.0–15.0)
MCH: 30.6 pg (ref 26.0–34.0)
MCHC: 32.8 g/dL (ref 30.0–36.0)
MCV: 93.5 fL (ref 80.0–100.0)
Platelets: 128 10*3/uL — ABNORMAL LOW (ref 150–400)
RBC: 3.72 MIL/uL — ABNORMAL LOW (ref 3.87–5.11)
RDW: 15.4 % (ref 11.5–15.5)
WBC: 7.8 10*3/uL (ref 4.0–10.5)
nRBC: 0 % (ref 0.0–0.2)

## 2022-11-24 LAB — MAGNESIUM: Magnesium: 1.9 mg/dL (ref 1.7–2.4)

## 2022-11-24 LAB — PHOSPHORUS: Phosphorus: 2.2 mg/dL — ABNORMAL LOW (ref 2.5–4.6)

## 2022-11-24 MED ORDER — POTASSIUM CHLORIDE 2 MEQ/ML IV SOLN
INTRAVENOUS | Status: DC
  Filled 2022-11-24 (×2): qty 1000

## 2022-11-24 MED ORDER — K PHOS MONO-SOD PHOS DI & MONO 155-852-130 MG PO TABS
500.0000 mg | ORAL_TABLET | Freq: Two times a day (BID) | ORAL | Status: DC
  Administered 2022-11-24 – 2022-11-25 (×2): 500 mg via ORAL
  Filled 2022-11-24 (×2): qty 2

## 2022-11-24 MED ORDER — POTASSIUM CHLORIDE 2 MEQ/ML IV SOLN
INTRAVENOUS | Status: DC
  Filled 2022-11-24 (×3): qty 1000

## 2022-11-24 MED ORDER — POTASSIUM CHLORIDE 2 MEQ/ML IV SOLN: INTRAVENOUS | Status: DC

## 2022-11-24 MED ORDER — AMOXICILLIN-POT CLAVULANATE 875-125 MG PO TABS
1.0000 | ORAL_TABLET | Freq: Two times a day (BID) | ORAL | Status: AC
  Administered 2022-11-24 – 2022-11-28 (×8): 1 via ORAL
  Filled 2022-11-24 (×8): qty 1

## 2022-11-24 NOTE — Progress Notes (Signed)
PROGRESS NOTE   Kathleen Jensen  ZOX:[id number] DOB: [birth year] DOA: 11/16/2022 PCP: Patient, No Pcp Per  Brief Narrative:   72 year old [name] resident Aphasic 2/2 large intraparenchymal hemorrhage/CVA 2018+ PEG tube placed 2023+ indwelling  Prior admission 04/2022 showed Dilantin toxicity resulting in worsening encephalopathy Sick sinus syndrome + dual-chamber /no anticoagulation 2/2 GI bleed--follows with Dr. Rollen Sox Presented from [facility] with gurgling respirations weeping foul-smelling urine with indwelling Foley-initially Rx 12 L oxygen-concern on admission PNA/?  UTI ABG pCO2 56 pH 7.4  Patient has been seen by palliative care in 05/2021 and again 01/2022 and finally this admission--lengthy conversation was undertaken with the family by palliative care-elected DNR/DNI but still?  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   Hospital-Problem based course  Acute respiratory failure with hypoxia on admission 2/2 aspiration pneumonitis On Unasyn since 4/20-will transition to Augmentin liquid probably in the next 1 day or so and complete a 14-day course given high risk for recurrence ---realistic conversation with the patient's daughter Elzie Rings 4/28--understands life limiting process a--that aspiration will continue to occur-tthough realistic, Family want to attempt everything short of heroic and aggressive resuscitation with ventilation and shocks Mentioned-we can give it a try-patient has had the stroke since 2018 and she voices her concerns about the care given at nursing facility  Changing Unasyn to Augmentin solution to complete a total of 10 to 14 days based on progress in the next 24 hours and based on fever curve  Constipation obstipation with prior Roux-en-Y and partial SBO Some concern for SBO earlier in the hospital stay-having stool now Appreciated with nutritionist graduating tube feeds-watch for nausea  vomiting  Intraparenchymal hemorrhage 2018+ PEG tube dependent + Foley dependent Complicated by seizures?  Dilantin toxicity previously Current meds:- Dilantin 150 twice daily Keppra 1000 twice daily Valproic acid 500 3 times daily Valproic acid 750 at bedtime  Mild hypokalemia, resolved Hypomag, Hypophosphatemia Change fluids around several times today as informed that the patient has persistently low blood sugars--see current orders including D5/0.45@100  cc an hour + K 60 mEq Magnesium was aggressively replaced on 4/27-recheck all labs in a.m. including phosphorus Adding Neutra-Phos 500 twice daily  Sick sinus syndrome + dual PPM not on AC 2/2 high risk bleed Continue Lopressor 25 twice daily, amiodarone 200 daily  Likely can transfer to MedSurg when stable in the next several days as telemetry is benign  Abdominal distention + prior Roux-en-Y SBO discovered this hospitalization  HTN Hypothyroid  DVT prophylaxis: SCD Code Status: DNR Family Communication: See my documentation above Disposition:  Status is: Inpatient Remains inpatient appropriate because:   Still ill with high risk for decompensation    Subjective:  ROS unobtainable groans when I move her Seems to be having stool in bed   Objective: Vitals:   11/24/22 0523 11/24/22 0714 11/24/22 1117 11/24/22 1522  BP:  116/69 121/87 128/79  Pulse:  60 60 60  Resp:  16 16 17   Temp:  (!) 97.2 F (36.2 C) (!) 96.2 F (35.7 C) 97.6 F (36.4 C)  TempSrc:  Axillary Axillary Axillary  SpO2:  97% 100% 99%  Weight: 88.6 kg       Intake/Output Summary (Last 24 hours) at 11/24/2022 1656 Last data filed at 11/24/2022 1100 Gross per 24 hour  Intake 1430.62 ml  Output 1025 ml  Net 405.62 ml    Filed Weights   11/22/22 1100 11/24/22 0523  Weight: 93.3 kg 88.6 kg    Examination:  Ill-appearing  black female no distress Nonverbal No icterus no pallor poor dentition Swollen extremities Foley catheter in  place Abdomen slight distention  ROM intact to menance Rest of neuroexam could not be done  Data Reviewed: personally reviewed   CBC    Component Value Date/Time   WBC 7.8 11/24/2022 0418   RBC 3.72 (L) 11/24/2022 0418   HGB 11.4 (L) 11/24/2022 0418   HCT 34.8 (L) 11/24/2022 0418   PLT 128 (L) 11/24/2022 0418   MCV 93.5 11/24/2022 0418   MCH 30.6 11/24/2022 0418   MCHC 32.8 11/24/2022 0418   RDW 15.4 11/24/2022 0418   LYMPHSABS 2.3 11/18/2022 1843   MONOABS 0.7 11/18/2022 1843   EOSABS 0.1 11/18/2022 1843   BASOSABS 0.0 11/18/2022 1843      Latest Ref Rng & Units 11/24/2022    4:18 AM 11/23/2022   12:23 AM 11/22/2022   12:24 AM  CMP  Glucose 70 - 99 mg/dL 161  92  096   BUN 8 - 23 mg/dL 5  5  <5   Creatinine 0.45 - 1.00 mg/dL 4.09  8.11  9.14   Sodium 135 - 145 mmol/L 138  135  131   Potassium 3.5 - 5.1 mmol/L 2.9  3.3  3.1   Chloride 98 - 111 mmol/L 102  101  98   CO2 22 - 32 mmol/L 30  27  28    Calcium 8.9 - 10.3 mg/dL 7.5  7.6  7.2      Radiology Studies: DG CHEST PORT 1 VIEW  Result Date: 11/23/2022 CLINICAL DATA:  Status post PICC line placement. EXAM: PORTABLE CHEST 1 VIEW COMPARISON:  November 21, 2022 FINDINGS: Mildly enlarged cardiac silhouette. Calcific atherosclerotic disease and tortuosity of the aorta. Clear lungs. No evidence of pneumothorax. Right PICC line terminates in the right atrium. Stable dual lead cardiac pacemaker. IMPRESSION: 1. Right PICC line terminates in the right atrium. 2. Mildly enlarged cardiac silhouette. Electronically Signed   By: Ted Mcalpine M.D.   On: 11/23/2022 13:09   [place] EKG SITE RITE  Result Date: 11/23/2022 If Site Rite image not attached, placement could not be confirmed due to current cardiac rhythm.  DG Abd Portable 1V  Result Date: 11/23/2022 CLINICAL DATA:  Ileus. EXAM: PORTABLE ABDOMEN - 1 VIEW COMPARISON:  11/21/2022 and CT abdomen 11/19/2022 FINDINGS: Diffuse bowel gas throughout the abdomen and pelvis has  minimally changed. The gas appears to be predominantly in the colon. There appears to be a small amount of gas in the rectum which is similar to the previous examination. Bowel gas distension has minimally changed. Jejunal feeding tube is again noted. Postsurgical changes in lumbar spine. Chronic metallic densities in the right lower abdomen. Postoperative changes in the upper abdomen. Limited evaluation for free air on these supine images. Uterine calcifications in the pelvis. IMPRESSION: 1. Diffuse bowel gas distension throughout the abdomen and pelvis which has minimally changed. Bowel gas is predominantly in the colon. There is a small amount of gas in the rectum. Based on the recent CT, these findings could represent at least a partial colonic obstruction. 2. Postoperative changes with feeding tube. Electronically Signed   By: Richarda Overlie M.D.   On: 11/23/2022 08:32     Scheduled Meds:  acetaminophen  640 mg Per Tube Q8H   amiodarone  200 mg Per Tube Daily   aspirin  81 mg Per Tube QHS   Chlorhexidine Gluconate Cloth  6 each Topical Daily   feeding supplement (PROSource TF20)  60 mL Per Tube Daily   free water  100 mL Per Tube Q4H   hydrOXYzine  10 mg Per Tube TID   leptospermum manuka honey  1 Application Topical Daily   levETIRAcetam  1,000 mg Per Tube BID   metoprolol tartrate  25 mg Per Tube BID   mouth rinse  15 mL Mouth Rinse 4 times per day   phenytoin  150 mg Per Tube BID   simvastatin  10 mg Per Tube QHS   sodium chloride flush  10-40 mL Intracatheter Q12H   valproic acid  500 mg Per Tube TID   valproic acid  750 mg Per Tube QHS   Continuous Infusions:  ampicillin-sulbactam (UNASYN) IV 3 g (11/24/22 1130)   dextrose 10 % and 0.45 % NaCl 1,000 mL with potassium chloride 40 mEq infusion     feeding supplement (OSMOLITE 1.2 CAL) 1,000 mL (11/23/22 1641)     LOS: 8 days   Time spent: 39  Rhetta Mura, MD [facility] To contact the attending provider between  7A-7P or the covering provider during after hours 7P-7A, please log into the web site [email or web address] and access using universal [place] password for that web site. If you do not have the password, please call the hospital operator.  11/24/2022, 4:56 PM

## 2022-11-25 ENCOUNTER — Inpatient Hospital Stay (HOSPITAL_COMMUNITY): Payer: Medicare HMO

## 2022-11-25 DIAGNOSIS — J9601 Acute respiratory failure with hypoxia: Secondary | ICD-10-CM | POA: Diagnosis not present

## 2022-11-25 LAB — BASIC METABOLIC PANEL
Anion gap: 4 — ABNORMAL LOW (ref 5–15)
BUN: <5 mg/dL — ABNORMAL LOW (ref 8–23)
CO2: 30 mmol/L (ref 22–32)
Calcium: 7.7 mg/dL — ABNORMAL LOW (ref 8.9–10.3)
Chloride: 100 mmol/L (ref 98–111)
Creatinine, Ser: 0.36 mg/dL — ABNORMAL LOW (ref 0.44–1.00)
GFR, Estimated: >60 mL/min (ref >60)
Glucose, Bld: 121 mg/dL — ABNORMAL HIGH (ref 70–99)
Potassium: 3.8 mmol/L (ref 3.5–5.1)
Sodium: 134 mmol/L — ABNORMAL LOW (ref 135–145)

## 2022-11-25 LAB — MAGNESIUM: Magnesium: 1.6 mg/dL — ABNORMAL LOW (ref 1.7–2.4)

## 2022-11-25 LAB — GLUCOSE, CAPILLARY
Glucose-Capillary: 134 mg/dL — ABNORMAL HIGH (ref 70–99)
Glucose-Capillary: 105 mg/dL — ABNORMAL HIGH (ref 70–99)
Glucose-Capillary: 131 mg/dL — ABNORMAL HIGH (ref 70–99)
Glucose-Capillary: 48 mg/dL — ABNORMAL LOW (ref 70–99)
Glucose-Capillary: 62 mg/dL — ABNORMAL LOW (ref 70–99)
Glucose-Capillary: 62 mg/dL — ABNORMAL LOW (ref 70–99)
Glucose-Capillary: 91 mg/dL (ref 70–99)
Glucose-Capillary: 81 mg/dL (ref 70–99)

## 2022-11-25 LAB — CBC
HCT: 37.7 % (ref 36.0–46.0)
Hemoglobin: 12.8 g/dL (ref 12.0–15.0)
MCH: 31.4 pg (ref 26.0–34.0)
MCHC: 34 g/dL (ref 30.0–36.0)
MCV: 92.6 fL (ref 80.0–100.0)
Platelets: 151 10*3/uL (ref 150–400)
RBC: 4.07 MIL/uL (ref 3.87–5.11)
RDW: 15.8 % — ABNORMAL HIGH (ref 11.5–15.5)
WBC: 7.8 10*3/uL (ref 4.0–10.5)
nRBC: 0 % (ref 0.0–0.2)

## 2022-11-25 LAB — ALBUMIN: Albumin: 1.8 g/dL — ABNORMAL LOW (ref 3.5–5.0)

## 2022-11-25 LAB — PHOSPHORUS: Phosphorus: 2.4 mg/dL — ABNORMAL LOW (ref 2.5–4.6)

## 2022-11-25 LAB — PHENYTOIN LEVEL, TOTAL: Phenytoin Lvl: 20.2 ug/mL — ABNORMAL HIGH (ref 10.0–20.0)

## 2022-11-25 MED ORDER — DEXTROSE 50 % IV SOLN
INTRAVENOUS | Status: AC
  Administered 2022-11-25: 12.5 mL
  Filled 2022-11-25: qty 50

## 2022-11-25 MED ORDER — FREE WATER
100.0000 mL | Freq: Four times a day (QID) | Status: DC
  Administered 2022-11-25 – 2022-12-03 (×33): 100 mL

## 2022-11-25 MED ORDER — PHENYTOIN 125 MG/5ML PO SUSP
150.0000 mg | Freq: Two times a day (BID) | ORAL | Status: DC
  Filled 2022-11-25: qty 6

## 2022-11-25 MED ORDER — K PHOS MONO-SOD PHOS DI & MONO 155-852-130 MG PO TABS
500.0000 mg | ORAL_TABLET | Freq: Three times a day (TID) | ORAL | Status: DC
  Administered 2022-11-25 – 2022-12-03 (×24): 500 mg via ORAL
  Filled 2022-11-25 (×24): qty 2

## 2022-11-25 MED ORDER — OSMOLITE 1.2 CAL PO LIQD
1000.0000 mL | ORAL | Status: DC
  Administered 2022-11-25: 20 mL
  Filled 2022-11-25 (×2): qty 1000

## 2022-11-25 MED ORDER — MAGNESIUM SULFATE 4 GM/100ML IV SOLN
4.0000 g | Freq: Once | INTRAVENOUS | Status: AC
  Administered 2022-11-25: 4 g via INTRAVENOUS
  Filled 2022-11-25: qty 100

## 2022-11-25 NOTE — Progress Notes (Signed)
Patient transferred to 5W. CSW following for discharge needs.   Joaquin Courts, MSW, [facility]

## 2022-11-25 NOTE — Progress Notes (Signed)
Phenytoin Initial Consult Indication: seizures  Allergies  Allergen Reactions   Tape Rash   Demerol [Meperidine Hcl] Nausea And Vomiting   Sulfonamide Derivatives Itching   Bacitracin-Polymyxin B Dermatitis   Oxycontin [Oxycodone] Itching    Patient Measurements: Weight: 88.6 kg (195 lb 5.2 oz)   Body mass index is 32.5 kg/m.   Vital signs: Temp: 97.7 F (36.5 C) (04/29 1227) Temp Source: Axillary (04/29 1227) BP: 137/93 (04/29 1227) Pulse Rate: 62 (04/29 1227)  Labs: Lab Results  Component Value Date/Time   Albumin 1.8 (L) 11/25/2022 0500   Phenytoin Lvl 20.2 (H) 11/25/2022 0631   Lab Results  Component Value Date   PHENYTOIN 20.2 (H) 11/25/2022   VALPROATE 53 11/16/2022   CBMZ 2.0 (L) 04/12/2020   CrCl cannot be calculated (Unknown ideal weight.).    Assessment: Patient's home phenyotin was continued on admission, however, there was uncertainty about the dose she was taking. The previous dose of 175mg  Q12hr was resumed until it was clarified on 4/26 that the home dose was 150mg  Q12hr. One dose was skipped to help with clearance and 150mg  was resumed on 4/27 AM. Patient was aphasic on admission with limited response during this hospital stay. Noted patient with high phenytoin levels in oct 2023.  Corrected phenytoin level: 43.9, which is supratherapeutic. However, level was drawn ~8hr after dose so it is not a true trough. Even so, the true trough is likely supratherapeutic.  Seizure activity: none noted this admission Significant potential drug interactions: Trickle tube feeds were started 4/27. Amiodarone and VPA may increase phenytoin, both are chronic home medications.   Goals of care:  Total phenytoin level: 10-20 mcg/ml Free phenytoin level: 1-2 mcg/ml  Plan:   Hold phenytoin for 2 days then repeat level to determine when to restart  Anticipated maintenance dose is 100mg  Q12 hr but may change depending on tube feed rate  Pharmacy will continue to follow  regarding obtaining total phenytoin levels and dose adjustments as indicated.   Alphia Moh, PharmD, BCPS, BCCP Clinical Pharmacist  Please check AMION for all [facility] phone numbers After 10:00 PM, call Main Pharmacy [phone]

## 2022-11-25 NOTE — Progress Notes (Signed)
Patient had a hypoglycemic event at 1556. CBG was 48. 1 amp of dextrose 50% was given and CBG at 1715 was 91. Will continue to monitor.

## 2022-11-25 NOTE — Progress Notes (Signed)
Level of care decreased to Med-Surg. Patient transported via bed to new unit 5W 21. Handoff completed previously. Care turned over at this time to [facility]

## 2022-11-25 NOTE — Progress Notes (Signed)
PT Cancellation Note/ Discharge  Patient Details Name: Kathleen Jensen MRN: [id number] DOB: 1950-09-04   Cancelled Treatment:    Reason Eval/Treat Not Completed: PT screened, no needs identified, will sign off (pt total assist since July 2023 per daughter and not currently appropriate for acute therapy. will sign off.)   Freeda Spivey B Ambert Virrueta 11/25/2022, 10:27 AM Merryl Hacker, PT Acute Rehabilitation Services Office: [phone]

## 2022-11-25 NOTE — Consult Note (Signed)
WOC Nurse Consult Note: Reason for Consult:Unstageable pressure injury to sacrum.  IS DNR, recently started tube feeds via J tube.  Have been using Manuka honey dressings daily to promote autolysis and prevent decline in wound.  Patient yells with turning and repositioning, eyes remain closed.  Heels remain intact, PRevalon boots in place bilaterally. Patient with pillows under hip to offload pressure.  Turning and repositioning in place. Now that nutrition is being adequately addressed, wound has improved potential to heal.  Wound type:unstageable pressure injury Pressure Injury POA: Yes  wound to sacrum noted as far back as 01/2022.  Resides in LTC facility Measurement: 6 cm x 6 cm x 2 cm - full depth is obscured by presence of devitalized tissue.  Wound bed: 40% dark devitalized tissue  60 % pale pink nongranulating tissue Drainage (amount, consistency, odor) moderate serosanguinous  faint musty odor, not pungent Periwound: foley catheter in place, skin clean and dry, moisturizes Dressing procedure/placement/frequency: Cleanse sacral wound with NS and pat dry. Apply Medihoney to wound bed.  Cover with gauze and silicone foam. Change daily.  Will not follow at this time.  Please re-consult if needed.  Mike Gip MSN, RN, FNP-BC CWON Wound, Ostomy, Continence Nurse Outpatient [facility] [phone] Pager [phone]

## 2022-11-25 NOTE — Progress Notes (Addendum)
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 Rollen Sox Presented from [facility] with gurgling respirations weeping foul-smelling urine with indwelling Foley-initially Rx 12 L oxygen-concern on admission PNA/?  UTI ABG pCO2 56 pH 7.4  Patient has been seen by palliative care in 05/2021 and again 01/2022 and finally this admission--lengthy conversation was undertaken with the family by palliative care-elected DNR/DNI but still?  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  Palliative care has been following peripherally Patient was transferred to MedSurg given clinical stability in the setting of underlying chronic life-limiting illness but hemodynamic stability   4/27 PICC line placed given poor access   Hospital-Problem based course  Acute respiratory failure with hypoxia on admission 2/2 aspiration pneumonitis Underlying severe dysphagia and PEG tube dependent for nutrition Transition Unasyn-->Augmentin to complete a total of 14 days --- Palliative to ollow with daughter Elzie Rings 4/28--understands life limiting process --that aspiration will continue to occur- -Upper airway gurgling sound today therefore get CXR  Constipation obstipation with prior Roux-en-Y and partial SBO--PEG tube dependent Some concern for SBO earlier in the hospital stay-having stool now Nutritionist to follow-up as above Changed free water to 100 every 6  cut back tube feeds, gradually as per nutritionist and keep at 60 degrees in the bed   Prior large CVA/intraparenchymal hemorrhage 2018+ PEG  tube dependent + Foley dependent Complicated by seizures?  Dilantin toxicity previously Current meds:- Dilantin 150 twice daily Keppra 1000 twice daily Valproic acid 500 3 times daily Valproic acid 750 at bedtime  Mild hypokalemia, resolved Hypomag, Hypophosphatemia DC fluids 4/29--- is currently minus 2.4 L and weight is down from 93 to 88 kg Give 4 g of magnesium today again, increase Neutra-Phos 500 to 3 times daily  Sick sinus syndrome + dual PPM not on AC 2/2 high risk bleed Continue Lopressor 25 twice daily, amiodarone 200 daily  Given relative stability can transfer to MedSurg  Abdominal distention + prior Roux-en-Y SBO discovered this hospitalization and has resolved  HTN Hypothyroid  Unstageable sacral decubiti Appreciate wound nurse reassessing not emergently-use Medihoney to help with dry gauze and use silicone foam  DVT prophylaxis: SCD Code Status: DNR Family Communication: See my documentation above Disposition:  Status is: Inpatient Remains inpatient appropriate because:   Still ill with high risk for decompensation    Subjective:  ROS unobtainable-nursing reports overnight some gurgling sounds in the upper airway she is also becoming plethoric We have a chest x-ray pending No family is at the bedside   Objective: Vitals:   11/24/22 2345 11/25/22 0408 11/25/22 0500 11/25/22 0712  BP: 139/85 (!) 140/89  (!) 145/88  Pulse: 60 60  60  Resp: 15 15  14   Temp: 98.5 F (36.9 C) 97.6 F (36.4 C)  97.7 F (36.5 C)  TempSrc: Axillary Axillary  Oral  SpO2: 100% 99%  98%  Weight:   88.6 kg     Intake/Output Summary (Last 24 hours) at 11/25/2022 0853 Last data filed at 11/25/2022 0300 Gross per 24 hour  Intake --  Output 3025 ml  Net -3025 ml    Filed Weights   11/22/22 1100 11/24/22 0523 11/25/22 0500  Weight: 93.3 kg  88.6 kg 88.6 kg    Examination:  Ill-appearing black female no distress Nonverbal No icterus no pallor poor dentition Swollen  extremities--Contractures in right upper extremity Foley catheter in place Abdomen distended  I did not examine sacrum today  Data Reviewed: personally reviewed   CBC    Component Value Date/Time   WBC 7.8 11/25/2022 0631   RBC 4.07 11/25/2022 0631   HGB 12.8 11/25/2022 0631   HCT 37.7 11/25/2022 0631   PLT 151 11/25/2022 0631   MCV 92.6 11/25/2022 0631   MCH 31.4 11/25/2022 0631   MCHC 34.0 11/25/2022 0631   RDW 15.8 (H) 11/25/2022 0631   LYMPHSABS 2.3 11/18/2022 1843   MONOABS 0.7 11/18/2022 1843   EOSABS 0.1 11/18/2022 1843   BASOSABS 0.0 11/18/2022 1843      Latest Ref Rng & Units 11/25/2022    6:31 AM 11/24/2022    4:18 AM 11/23/2022   12:23 AM  CMP  Glucose 70 - 99 mg/dL 161  096  92   BUN 8 - 23 mg/dL 5  5  5    Creatinine 0.44 - 1.00 mg/dL 0.45  4.09  8.11   Sodium 135 - 145 mmol/L 134  138  135   Potassium 3.5 - 5.1 mmol/L 3.8  2.9  3.3   Chloride 98 - 111 mmol/L 100  102  101   CO2 22 - 32 mmol/L 30  30  27    Calcium 8.9 - 10.3 mg/dL 7.7  7.5  7.6      Radiology Studies: DG CHEST PORT 1 VIEW  Result Date: 11/23/2022 CLINICAL DATA:  Status post PICC line placement. EXAM: PORTABLE CHEST 1 VIEW COMPARISON:  November 21, 2022 FINDINGS: Mildly enlarged cardiac silhouette. Calcific atherosclerotic disease and tortuosity of the aorta. Clear lungs. No evidence of pneumothorax. Right PICC line terminates in the right atrium. Stable dual lead cardiac pacemaker. IMPRESSION: 1. Right PICC line terminates in the right atrium. 2. Mildly enlarged cardiac silhouette. Electronically Signed   By: Ted Mcalpine M.D.   On: 11/23/2022 13:09   [place] EKG SITE RITE  Result Date: 11/23/2022 If Site Rite image not attached, placement could not be confirmed due to current cardiac rhythm.    Scheduled Meds:  acetaminophen  640 mg Per Tube Q8H   amiodarone  200 mg Per Tube Daily   amoxicillin-clavulanate  1 tablet Oral Q12H   aspirin  81 mg Per Tube QHS   Chlorhexidine Gluconate  Cloth  6 each Topical Daily   feeding supplement (PROSource TF20)  60 mL Per Tube Daily   free water  100 mL Per Tube Q6H   hydrOXYzine  10 mg Per Tube TID   leptospermum manuka honey  1 Application Topical Daily   levETIRAcetam  1,000 mg Per Tube BID   metoprolol tartrate  25 mg Per Tube BID   mouth rinse  15 mL Mouth Rinse 4 times per day   phenytoin  150 mg Per Tube BID   phosphorus  500 mg Oral TID   simvastatin  10 mg Per Tube QHS   sodium chloride flush  10-40 mL Intracatheter Q12H   valproic acid  500 mg Per Tube TID   valproic acid  750 mg Per Tube QHS   Continuous Infusions:  feeding supplement (OSMOLITE 1.2 CAL) 1,000 mL (11/23/22 1641)   magnesium sulfate bolus IVPB       LOS: 9 days   Time spent: 45  Rhetta Mura, MD [facility] To contact  the attending provider between 7A-7P or the covering provider during after hours 7P-7A, please log into the web site [email or web address] and access using universal [place] password for that web site. If you do not have the password, please call the hospital operator.  11/25/2022, 8:53 AM

## 2022-11-25 NOTE — Plan of Care (Signed)
  Problem: Activity: Goal: Risk for activity intolerance will decrease Outcome: Not Progressing   Problem: Nutrition: Goal: Adequate nutrition will be maintained Outcome: Not Progressing   

## 2022-11-26 ENCOUNTER — Inpatient Hospital Stay (HOSPITAL_COMMUNITY): Payer: Medicare HMO

## 2022-11-26 DIAGNOSIS — J9601 Acute respiratory failure with hypoxia: Secondary | ICD-10-CM | POA: Diagnosis not present

## 2022-11-26 LAB — C-REACTIVE PROTEIN: CRP: 7 mg/dL — ABNORMAL HIGH (ref <1.0)

## 2022-11-26 LAB — BASIC METABOLIC PANEL
Anion gap: 7 (ref 5–15)
BUN: 10 mg/dL (ref 8–23)
CO2: 28 mmol/L (ref 22–32)
Calcium: 7.4 mg/dL — ABNORMAL LOW (ref 8.9–10.3)
Chloride: 97 mmol/L — ABNORMAL LOW (ref 98–111)
Creatinine, Ser: 0.42 mg/dL — ABNORMAL LOW (ref 0.44–1.00)
GFR, Estimated: >60 mL/min (ref >60)
Glucose, Bld: 153 mg/dL — ABNORMAL HIGH (ref 70–99)
Potassium: 3.3 mmol/L — ABNORMAL LOW (ref 3.5–5.1)
Sodium: 132 mmol/L — ABNORMAL LOW (ref 135–145)

## 2022-11-26 LAB — GLUCOSE, CAPILLARY
Glucose-Capillary: 103 mg/dL — ABNORMAL HIGH (ref 70–99)
Glucose-Capillary: 116 mg/dL — ABNORMAL HIGH (ref 70–99)
Glucose-Capillary: 127 mg/dL — ABNORMAL HIGH (ref 70–99)
Glucose-Capillary: 60 mg/dL — ABNORMAL LOW (ref 70–99)
Glucose-Capillary: 81 mg/dL (ref 70–99)
Glucose-Capillary: 87 mg/dL (ref 70–99)
Glucose-Capillary: 89 mg/dL (ref 70–99)

## 2022-11-26 LAB — PHOSPHORUS: Phosphorus: 3.8 mg/dL (ref 2.5–4.6)

## 2022-11-26 LAB — PROCALCITONIN: Procalcitonin: <0.1 ng/mL

## 2022-11-26 LAB — VALPROIC ACID LEVEL: Valproic Acid Lvl: 70 ug/mL (ref 50.0–100.0)

## 2022-11-26 LAB — BRAIN NATRIURETIC PEPTIDE: B Natriuretic Peptide: 209.9 pg/mL — ABNORMAL HIGH (ref 0.0–100.0)

## 2022-11-26 LAB — CORTISOL: Cortisol, Plasma: 24.1 ug/dL

## 2022-11-26 LAB — CBC
HCT: 35.1 % — ABNORMAL LOW (ref 36.0–46.0)
Hemoglobin: 11.6 g/dL — ABNORMAL LOW (ref 12.0–15.0)
MCH: 30.9 pg (ref 26.0–34.0)
MCHC: 33 g/dL (ref 30.0–36.0)
MCV: 93.6 fL (ref 80.0–100.0)
Platelets: 143 10*3/uL — ABNORMAL LOW (ref 150–400)
RBC: 3.75 MIL/uL — ABNORMAL LOW (ref 3.87–5.11)
RDW: 15.9 % — ABNORMAL HIGH (ref 11.5–15.5)
WBC: 10.1 10*3/uL (ref 4.0–10.5)
nRBC: 0 % (ref 0.0–0.2)

## 2022-11-26 LAB — MAGNESIUM: Magnesium: 2.1 mg/dL (ref 1.7–2.4)

## 2022-11-26 LAB — HEMOGLOBIN A1C
Hgb A1c MFr Bld: 5.4 % (ref 4.8–5.6)
Mean Plasma Glucose: 108.28 mg/dL

## 2022-11-26 LAB — TSH: TSH: 2.504 u[IU]/mL (ref 0.350–4.500)

## 2022-11-26 MED ORDER — BISACODYL 10 MG RE SUPP
10.0000 mg | Freq: Once | RECTAL | Status: DC
  Filled 2022-11-26: qty 1

## 2022-11-26 MED ORDER — OSMOLITE 1.2 CAL PO LIQD
1000.0000 mL | ORAL | Status: DC
  Administered 2022-11-26 – 2022-12-03 (×7): 1000 mL
  Filled 2022-11-26 (×13): qty 1000

## 2022-11-26 MED ORDER — POTASSIUM CHLORIDE 20 MEQ PO PACK
40.0000 meq | PACK | Freq: Once | ORAL | Status: AC
  Administered 2022-11-26: 40 meq
  Filled 2022-11-26: qty 2

## 2022-11-26 MED ORDER — DEXTROSE 50 % IV SOLN
50.0000 mL | INTRAVENOUS | Status: DC | PRN
  Administered 2022-11-26 – 2022-11-27 (×2): 50 mL via INTRAVENOUS
  Filled 2022-11-26 (×2): qty 50

## 2022-11-26 NOTE — Plan of Care (Signed)

## 2022-11-26 NOTE — Progress Notes (Signed)
Nutrition Follow-up  DOCUMENTATION CODES:   Not applicable  INTERVENTION:  Tube feed regimen via J-tube: Continue to advance Osmolite 1.2 by 10 ml q 12 hours to goal rate of 65 ml/hr (1560 ml/day) ProSource TF20 60 ml daily Free water flushes of 100 ml q 4 hours Tube feeds at goal provides 1952 kcal, 106.5 gm protein, and 1879 mL total free water daily.  Continue to monitor magnesium, potassium, and phosphorus BID for at least 3 days, MD to replete as needed, as pt is at risk for refeeding syndrome given prolong NPO. Once able to tolerate tube feeds at goal, recommend adding 1 packet Juven BID per tube, each packet provides 95 calories, 2.5 grams of protein, and 9.8 grams of carbohydrate; also contains L-arginine and L-glutamine, vitamin C, vitamin E, vitamin B-12, zinc, calcium, and calcium Beta-hydroxy-Beta-methylbutyrate to support wound healing   NUTRITION DIAGNOSIS:   Inadequate oral intake related to inability to eat as evidenced by NPO status. - Ongoing, being addressed via TF   GOAL:   Patient will meet greater than or equal to 90% of their needs - Being addressed via TF  MONITOR:   Labs, Weight trends, Skin, I & O's  REASON FOR ASSESSMENT:   NPO/Clear Liquid Diet    ASSESSMENT:   72 year old female who presented to the ED on 4/20 from [facility] with foul-smelling urine, gurgling respiration, hypoxia. 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 Pt admitted with acute respiratory failure, SBO versus ileus.  4/20 - admitted 4/27 - TF started 4/29 - TF held for concern for aspiration -restarted in afternoon; transferred to 5W  Pt laying in bed at time of RD visit. Pt does not interact with RD. TF currently running at 30 mL/hr. TF were  held yesterday due to concern for aspiration. Per MD note, pt has not been able to self feed in ~1 year.   Discussed with MD and RN.     Medications reviewed and include: Augmentin, Phosphorus, Potassium Chloride Labs reviewed: Sodium 132, Potassium 3.3, Phosphorus 3.8, Magnesium 2.1, CRP 7.0 24 hr CBGs 48-131  Diet Order:   Diet Order             Diet NPO time specified  Diet effective now                  EDUCATION NEEDS:   Not appropriate for education at this time  Skin:  Skin Assessment: Skin Integrity Issues: Skin Integrity Issues:: Unstageable Unstageable: sacrum (per WOC note)  Last BM:  4/30 - Type 7  Height:  Ht Readings from Last 1 Encounters:  05/16/22 5\' 5"  (1.651 m)   Weight:  Wt Readings from Last 1 Encounters:  11/26/22 88 kg   Ideal Body Weight:  56.8 kg  BMI:  Body mass index is 32.28 kg/m.  Estimated Nutritional Needs:  Kcal:  1900-2100 Protein:  100-120 grams Fluid:  >1.9 L   Kirby Crigler RD, LDN Clinical Dietitian See [facility] for contact information.

## 2022-11-26 NOTE — Progress Notes (Signed)
Brief Palliative Medicine Progress Note:  PMT following peripherally for needs/decline.  Medical records reviewed including progress notes, labs, imaging. No acute changes though hypoglycemic episodes noted 4/29 - plan is to check baseline TSH, A1c, and cortisol. Tube feeding restarted 4/29 - plan is to repeat KUB and monitor.  PMT will follow peripherally. Goals are clear for treating the treatable allowing time for outcomes. In the event of decline, family would not want escalation of care, but rather transition to comfort measures. If there are any imminent needs please call the service directly. Family also has PMT contact information should further needs arise.  Thank you for allowing PMT to assist in the care of this patient.  Jorian Willhoite M. Katrinka Blazing [facility] Palliative Medicine Team Team Phone: [phone] NO CHARGE

## 2022-11-26 NOTE — Progress Notes (Signed)
PROGRESS NOTE                                                                                                                                                                                                             Patient Demographics:    Kathleen Jensen, is a 72 y.o. female, DOB - [birth year], ZOX:[id number]  Outpatient Primary MD for the patient is Patient, No Pcp Per    LOS - 10  Admit date - 11/16/2022    Chief Complaint  Patient presents with   Aspiration       Brief Narrative (HPI from H&P)   71 y.o. 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  She has a history of A-fib, pacemaker in place, seizures, HTN, DM, hypothyroidism, anemia. She has a PEG tube in place for approximately the last year due to poor p.o. intake.  She has had a stepwise decline over the last year following sepsis from UTI and other infectious processes.    According to the daughter patient lives at nursing home, last time she was able to feed herself was close to 1 year ago, since then her quality of life has been going down, she was brought to the hospital from SNF with respiratory distress, acute hypoxic respiratory failure requiring nonrebreather and changes suggestive of aspiration pneumonia.  She was transferred to my care on 11/26/2022 on day 10 of hospital stay.   Subjective:    Kathleen Jensen today in bed, in no distress, unable to answer questions or follow commands.   Assessment  & Plan :    Acute hypoxic respiratory failure present on admission due to aspiration pneumonia in a patient with stroke, right-sided hemiparesis, expressive aphasia, dysphagia who is PEG tube dependent  She has been placed on 10 days of antibiotic finishing her course on 11/28/2022, currently no signs of active infection, no fever or leukocytosis, continue feeding through PEG tube,  continue aspiration precautions.  Baseline quality of life is poor.  Seen by palliative care continue present line of treatment.  Respiratory failure has improved, supplemental nasal cannula oxygen if required.  Initially was on nonrebreather in ER now much improved.  SpO2: 100 % O2 Flow Rate (L/min): 2 L/min FiO2 (%): 100 %   History of seizures (HCC)  -  On Depakote and Dilantin, supratherapeutic corrected Dilantin level on 11/26/2022, pharmacy monitoring holding Dilantin for now.  Abdominal distention - Patient with history of Roux-en-Y, was diagnosed with EDS on 11/24/2022, tube feeds were resumed on 11/25/2022, today they are running, abdominal exam reveals soft abdomen although minimally distended, repeat KUB and monitor.  History of cerebral infarction with baseline right-sided hemiparesis, expressive aphasia and dysphagia.  PEG tube dependent.  Supportive care.  Poor quality of life at baseline.  Continue aspirin and statin for secondary prevention.  Also history of intracerebral hemorrhage in the past.   Essential hypertension  - BP is controlled on metoprolol  Hypothyroidism - Check TSH   History of paroxysmal atrial fibrillation in July 2023 advised to score of greater than 5.  Seen by cardiology in the past no anticoagulation as high risk for intracerebral bleed.  Continue amiodarone, beta-blocker, aspirin and monitor.    History of sacral decubitus ulcers present upon admission.  Currently see nursing note for details.  Wound care to continue.    Hypokalemia.  Replaced.    Diabetes mellitus without complication (HCC) Not on insulin, PEG tube feeds, few episodes of hypoglycemia, could have been due to held tube feeds on 11/24/2022 however will check baseline TSH, A1c and cortisol level.  Lab Results  Component Value Date   HGBA1C 5.5 05/30/2021   CBG (last 3)  Recent Labs    11/26/22 0007 11/26/22 0443 11/26/22 0751  GLUCAP 81 89 103*   Lab Results  Component Value Date    TSH 1.923 11/16/2022           Condition - Extremely Guarded  Family Communication  :  daughter & Ileene Musa [phone]  in detail on 11/26/2022  Code Status :  DNR  Consults  :  Pall Care  PUD Prophylaxis : PPI   Procedures  :            Disposition Plan  :    Status is: Inpatient   DVT Prophylaxis  :    SCDs Start: 11/16/22 2214    Lab Results  Component Value Date   PLT 143 (L) 11/26/2022    Diet :  Diet Order             Diet NPO time specified  Diet effective now                    Inpatient Medications  Scheduled Meds:  acetaminophen  640 mg Per Tube Q8H   amiodarone  200 mg Per Tube Daily   amoxicillin-clavulanate  1 tablet Oral Q12H   aspirin  81 mg Per Tube QHS   Chlorhexidine Gluconate Cloth  6 each Topical Daily   feeding supplement (OSMOLITE 1.2 CAL)  1,000 mL Per Tube Q24H   feeding supplement (PROSource TF20)  60 mL Per Tube Daily   free water  100 mL Per Tube Q6H   hydrOXYzine  10 mg Per Tube TID   leptospermum manuka honey  1 Application Topical Daily   levETIRAcetam  1,000 mg Per Tube BID   metoprolol tartrate  25 mg Per Tube BID   mouth rinse  15 mL Mouth Rinse 4 times per day   phosphorus  500 mg Oral TID   potassium chloride  40 mEq Per Tube Once   simvastatin  10 mg Per Tube QHS   sodium chloride flush  10-40 mL Intracatheter Q12H   valproic acid  500 mg Per Tube TID  valproic acid  750 mg Per Tube QHS   Continuous Infusions: PRN Meds:.HYDROcodone-acetaminophen, lip balm, metoprolol tartrate, morphine injection, mouth rinse    Objective:   Vitals:   11/26/22 0011 11/26/22 0400 11/26/22 0445 11/26/22 0749  BP: 125/81 115/83  109/76  Pulse: 60 60  60  Resp: 15 14  17   Temp: 97.6 F (36.4 C) 97.6 F (36.4 C)  97.7 F (36.5 C)  TempSrc: Axillary Axillary  Oral  SpO2:  99%  100%  Weight:   88 kg     Wt Readings from Last 3 Encounters:  11/26/22 88 kg  05/16/22 82 kg  02/21/22 81.2 kg      Intake/Output Summary (Last 24 hours) at 11/26/2022 1017 Last data filed at 11/26/2022 0504 Gross per 24 hour  Intake 1748.64 ml  Output 1900 ml  Net -151.36 ml     Physical Exam  In bed awake but unable to answer questions due to underlying expressive aphasia, dense right-sided hemiparesis, moves left side to painful stimuli unable to follow commands, Foley, PEG, R Arm PICC (11/23/22) [place].AT, PERRAL Supple Neck, No JVD,   Symmetrical Chest wall movement, Good air movement bilaterally, CTAB RRR,No Gallops,Rubs or new Murmurs,  +ve B.Sounds, Abd Soft, No tenderness,   No Cyanosis, Clubbing or edema     RN pressure injury documentation: Pressure Injury 05/31/21 Sacrum Mid;Bilateral Open area- painful to touch (Active)  05/31/21 0830  Location: Sacrum  Location Orientation: Mid;Bilateral  Staging:   Wound Description (Comments): Open area- painful to touch  Present on Admission:      Pressure Injury 02/15/22 Sacrum Medial Stage 4 - Full thickness tissue loss with exposed bone, tendon or muscle. (Active)  02/15/22 2200  Location: Sacrum  Location Orientation: Medial  Staging: Stage 4 - Full thickness tissue loss with exposed bone, tendon or muscle.  Wound Description (Comments):   Present on Admission: Yes  Dressing Type Foam - Lift dressing to assess site every shift 11/26/22 0400      Data Review:    Recent Labs  Lab 11/22/22 0024 11/23/22 0023 11/24/22 0418 11/25/22 0631 11/26/22 0602  WBC 6.6 8.5 7.8 7.8 10.1  HGB 12.7 13.0 11.4* 12.8 11.6*  HCT 38.6 37.9 34.8* 37.7 35.1*  PLT 178 157 128* 151 143*  MCV 92.8 92.0 93.5 92.6 93.6  MCH 30.5 31.6 30.6 31.4 30.9  MCHC 32.9 34.3 32.8 34.0 33.0  RDW 15.1 15.5 15.4 15.8* 15.9*    Recent Labs  Lab 11/20/22 0708 11/20/22 2030 11/21/22 0025 11/22/22 0024 11/23/22 0016 11/23/22 0023 11/24/22 0418 11/25/22 0500 11/25/22 0631 11/26/22 0259 11/26/22 0602  NA 132*   < > 131* 131*  --  135 138  --  134*  --   132*  K 2.4*   < > 4.2 3.1*  --  3.3* 2.9*  --  3.8  --  3.3*  CL 97*   < > 97* 98  --  101 102  --  100  --  97*  CO2 30   < > 28 28  --  27 30  --  30  --  28  ANIONGAP 5   < > 6 5  --  7 6  --  4*  --  7  GLUCOSE 130*   < > 147* 129*  --  92 106*  --  121*  --  153*  BUN <5*   < > <5* <5*  --  5* 5*  --  <5*  --  10  CREATININE 0.38*   < > 0.36* 0.34*  --  0.50 0.38*  --  0.36*  --  0.42*  AST 37  --   --   --   --   --   --   --   --   --   --   ALT 16  --   --   --   --   --   --   --   --   --   --   ALKPHOS 100  --   --   --   --   --   --   --   --   --   --   BILITOT 0.6  --   --   --   --   --   --   --   --   --   --   ALBUMIN 1.6*  --   --   --   --   --   --  1.8*  --   --   --   CRP  --   --   --   --   --   --   --   --   --   --  7.0*  PROCALCITON  --   --   --   --   --   --   --   --   --   --  <0.10  BNP  --   --   --   --   --   --   --   --   --   --  209.9*  MG 1.2*  --  2.0  --  1.6*  --  1.9  --  1.6* 2.1  --   CALCIUM 7.2*   < > 7.4* 7.2*  --  7.6* 7.5*  --  7.7*  --  7.4*   < > = values in this interval not displayed.     Recent Labs  Lab 11/21/22 0025 11/22/22 0024 11/23/22 0016 11/23/22 0023 11/24/22 0418 11/25/22 0631 11/26/22 0259 11/26/22 0602  CRP  --   --   --   --   --   --   --  7.0*  PROCALCITON  --   --   --   --   --   --   --  <0.10  BNP  --   --   --   --   --   --   --  209.9*  MG 2.0  --  1.6*  --  1.9 1.6* 2.1  --   CALCIUM 7.4* 7.2*  --  7.6* 7.5* 7.7*  --  7.4*     Radiology Reports DG CHEST PORT 1 VIEW  Result Date: 11/25/2022 CLINICAL DATA:  Pneumonia EXAM: PORTABLE CHEST 1 VIEW COMPARISON:  X-ray 11/23/2022 and older FINDINGS: Underinflation. Enlarged cardiopericardial silhouette with calcified aorta. Trace vascular congestion versus bronchovascular crowding. No pneumothorax or effusion. Right-sided PICC in place with the tip of the upper right atrium and left-sided dual lead pacemaker. IMPRESSION: Poor inflation with elevated  right hemidiaphragm and either vascular congestion or bronchovascular crowding. Recommend follow up. Right-sided PICC with the tip of the upper right atrium Electronically Signed   By: Karen Kays M.D.   On: 11/25/2022 10:59   DG CHEST PORT 1 VIEW  Result Date: 11/23/2022 CLINICAL DATA:  Status post PICC line placement. EXAM: PORTABLE CHEST 1 VIEW COMPARISON:  November 21, 2022 FINDINGS: Mildly enlarged cardiac silhouette. Calcific atherosclerotic disease and tortuosity of the aorta. Clear lungs. No evidence of pneumothorax. Right PICC line terminates in the right atrium. Stable dual lead cardiac pacemaker. IMPRESSION: 1. Right PICC line terminates in the right atrium. 2. Mildly enlarged cardiac silhouette. Electronically Signed   By: Ted Mcalpine M.D.   On: 11/23/2022 13:09   [place] EKG SITE RITE  Result Date: 11/23/2022 If Site Rite image not attached, placement could not be confirmed due to current cardiac rhythm.  DG Abd Portable 1V  Result Date: 11/23/2022 CLINICAL DATA:  Ileus. EXAM: PORTABLE ABDOMEN - 1 VIEW COMPARISON:  11/21/2022 and CT abdomen 11/19/2022 FINDINGS: Diffuse bowel gas throughout the abdomen and pelvis has minimally changed. The gas appears to be predominantly in the colon. There appears to be a small amount of gas in the rectum which is similar to the previous examination. Bowel gas distension has minimally changed. Jejunal feeding tube is again noted. Postsurgical changes in lumbar spine. Chronic metallic densities in the right lower abdomen. Postoperative changes in the upper abdomen. Limited evaluation for free air on these supine images. Uterine calcifications in the pelvis. IMPRESSION: 1. Diffuse bowel gas distension throughout the abdomen and pelvis which has minimally changed. Bowel gas is predominantly in the colon. There is a small amount of gas in the rectum. Based on the recent CT, these findings could represent at least a partial colonic obstruction. 2. Postoperative  changes with feeding tube. Electronically Signed   By: Richarda Overlie M.D.   On: 11/23/2022 08:32      Signature  -   Susa Raring M.D on 11/26/2022 at 10:17 AM   -  To page go to [email or web address]

## 2022-11-27 DIAGNOSIS — R569 Unspecified convulsions: Secondary | ICD-10-CM | POA: Diagnosis not present

## 2022-11-27 DIAGNOSIS — J9601 Acute respiratory failure with hypoxia: Secondary | ICD-10-CM | POA: Diagnosis not present

## 2022-11-27 LAB — GLUCOSE, CAPILLARY
Glucose-Capillary: 55 mg/dL — ABNORMAL LOW (ref 70–99)
Glucose-Capillary: 103 mg/dL — ABNORMAL HIGH (ref 70–99)
Glucose-Capillary: 85 mg/dL (ref 70–99)
Glucose-Capillary: 90 mg/dL (ref 70–99)
Glucose-Capillary: 78 mg/dL (ref 70–99)

## 2022-11-27 LAB — CBC WITH DIFFERENTIAL/PLATELET
Abs Immature Granulocytes: 0.04 10*3/uL (ref 0.00–0.07)
Basophils Absolute: 0 10*3/uL (ref 0.0–0.1)
Basophils Relative: 0 %
Eosinophils Absolute: 0.2 10*3/uL (ref 0.0–0.5)
Eosinophils Relative: 2 %
HCT: 31.7 % — ABNORMAL LOW (ref 36.0–46.0)
Hemoglobin: 10.9 g/dL — ABNORMAL LOW (ref 12.0–15.0)
Immature Granulocytes: 0 %
Lymphocytes Relative: 44 %
Lymphs Abs: 4.5 10*3/uL — ABNORMAL HIGH (ref 0.7–4.0)
MCH: 31.9 pg (ref 26.0–34.0)
MCHC: 34.4 g/dL (ref 30.0–36.0)
MCV: 92.7 fL (ref 80.0–100.0)
Monocytes Absolute: 0.8 10*3/uL (ref 0.1–1.0)
Monocytes Relative: 8 %
Neutro Abs: 4.6 10*3/uL (ref 1.7–7.7)
Neutrophils Relative %: 46 %
Platelets: 142 10*3/uL — ABNORMAL LOW (ref 150–400)
RBC: 3.42 MIL/uL — ABNORMAL LOW (ref 3.87–5.11)
RDW: 15.9 % — ABNORMAL HIGH (ref 11.5–15.5)
WBC: 10.1 10*3/uL (ref 4.0–10.5)
nRBC: 0 % (ref 0.0–0.2)

## 2022-11-27 LAB — PHOSPHORUS: Phosphorus: 4.5 mg/dL (ref 2.5–4.6)

## 2022-11-27 LAB — BASIC METABOLIC PANEL
Anion gap: 8 (ref 5–15)
BUN: 14 mg/dL (ref 8–23)
CO2: 31 mmol/L (ref 22–32)
Calcium: 7.4 mg/dL — ABNORMAL LOW (ref 8.9–10.3)
Chloride: 97 mmol/L — ABNORMAL LOW (ref 98–111)
Creatinine, Ser: 0.42 mg/dL — ABNORMAL LOW (ref 0.44–1.00)
GFR, Estimated: >60 mL/min (ref >60)
Glucose, Bld: 84 mg/dL (ref 70–99)
Potassium: 3.2 mmol/L — ABNORMAL LOW (ref 3.5–5.1)
Sodium: 136 mmol/L (ref 135–145)

## 2022-11-27 LAB — BRAIN NATRIURETIC PEPTIDE: B Natriuretic Peptide: 171.6 pg/mL — ABNORMAL HIGH (ref 0.0–100.0)

## 2022-11-27 LAB — PHENYTOIN LEVEL, TOTAL: Phenytoin Lvl: 18.9 ug/mL (ref 10.0–20.0)

## 2022-11-27 LAB — ALBUMIN: Albumin: <1.5 g/dL — ABNORMAL LOW (ref 3.5–5.0)

## 2022-11-27 LAB — MAGNESIUM: Magnesium: 1.9 mg/dL (ref 1.7–2.4)

## 2022-11-27 MED ORDER — POTASSIUM CHLORIDE 20 MEQ PO PACK
40.0000 meq | PACK | ORAL | Status: AC
  Administered 2022-11-27 (×2): 40 meq
  Filled 2022-11-27 (×2): qty 2

## 2022-11-27 NOTE — Plan of Care (Signed)

## 2022-11-27 NOTE — Progress Notes (Signed)
CSW made [place] aware that patient may be medically stable for discharge soon.  Joaquin Courts, MSW, [facility]

## 2022-11-27 NOTE — Progress Notes (Addendum)
Ok to replace low k 3.2 with KCL x2 per Dr. Randol Kern.  Phenytoin level came back at 18.9 but corrected to >45 due to low albumin. Cont to hold. Check level Friday  Kacia Halley, PharmD, [place], [place], CPP Infectious Disease Pharmacist 11/27/2022 1:01 PM

## 2022-11-27 NOTE — Progress Notes (Signed)
PROGRESS NOTE                                                                                                                                                                                                             Patient Demographics:    Kathleen Jensen, is a 72 y.o. female, DOB - [birth year], ZOX:[id number]  Outpatient Primary MD for the patient is Patient, No Pcp Per    LOS - 11  Admit date - 11/16/2022    Chief Complaint  Patient presents with   Aspiration       Brief Narrative (HPI from H&P)    72 y.o. 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  She has a history of A-fib, pacemaker in place, seizures, HTN, DM, hypothyroidism, anemia. She has a PEG tube in place for approximately the last year due to poor p.o. intake.  She has had a stepwise decline over the last year following sepsis from UTI and other infectious processes.    According to the daughter patient lives at nursing home, last time she was able to feed herself was close to 1 year ago, since then her quality of life has been going down, she was brought to the hospital from SNF with respiratory distress, acute hypoxic respiratory failure requiring nonrebreather and changes suggestive of aspiration pneumonia.  She was transferred to my care on 11/26/2022 on day 10 of hospital stay.   Subjective:    Kathleen Jensen today in bed, in no distress, unable to answer questions or follow commands.  She had low CBG at 60 overnight,   Assessment  & Plan :    Acute hypoxic respiratory failure present on admission due to aspiration pneumonia in a patient with stroke, right-sided hemiparesis, expressive aphasia, dysphagia who is PEG tube dependent  She has been placed on 10 days of antibiotic finishing her course on 11/28/2022, currently no signs of active infection, no fever or leukocytosis,  continue feeding through PEG tube, continue aspiration precautions.  Baseline quality of life is poor.  Seen by palliative care continue present line of treatment.  Respiratory failure has improved, supplemental nasal cannula oxygen if required.  Initially was on nonrebreather in ER now much improved.  SpO2: 98 % O2 Flow Rate (L/min): 2 L/min FiO2 (%): 100 %  History of seizures (HCC)  -  -  On Depakote , keppra, and Dilantin. -Dilantin remains on hold given supratherapeutic level.  Abdominal distention  -X-ray significant for gaseous distention, but she has been tolerating her tube feed.    History of cerebral infarction with baseline right-sided hemiparesis, expressive aphasia and dysphagia.  PEG tube dependent.  Supportive care.  Poor quality of life at baseline.  Continue aspirin and statin for secondary prevention.  Also history of intracerebral hemorrhage in the past.   Essential hypertension  - BP is controlled on metoprolol  Hypothyroidism - Check TSH   History of paroxysmal atrial fibrillation in July 2023 advised to score of greater than 5.  Seen by cardiology in the past no anticoagulation as high risk for intracerebral bleed.  Continue amiodarone, beta-blocker, aspirin and monitor.    History of sacral decubitus ulcers present upon admission.  Currently see nursing note for details.  Wound care to continue.    Hypokalemia.  Replaced.    Diabetes mellitus without complication (HCC) Not on insulin, PEG tube feeds, few episodes of hypoglycemia, could have been due to held tube feeds on 11/24/2022 however will check baseline TSH and cortisol levels are appropriate  Lab Results  Component Value Date   HGBA1C 5.4 11/26/2022   CBG (last 3)  Recent Labs    11/27/22 0700 11/27/22 0727 11/27/22 1207  GLUCAP 85 103* 90   Lab Results  Component Value Date   TSH 2.504 11/26/2022           Condition - Extremely Guarded  Family Communication  : None at bedside  Code  Status :  DNR  Consults  :  Pall Care  PUD Prophylaxis : PPI   Procedures  :            Disposition Plan  :    Status is: Inpatient   DVT Prophylaxis  :    SCDs Start: 11/16/22 2214    Lab Results  Component Value Date   PLT 142 (L) 11/27/2022    Diet :  Diet Order             Diet NPO time specified  Diet effective now                    Inpatient Medications  Scheduled Meds:  acetaminophen  640 mg Per Tube Q8H   amiodarone  200 mg Per Tube Daily   amoxicillin-clavulanate  1 tablet Oral Q12H   aspirin  81 mg Per Tube QHS   bisacodyl  10 mg Rectal Once   Chlorhexidine Gluconate Cloth  6 each Topical Daily   feeding supplement (PROSource TF20)  60 mL Per Tube Daily   free water  100 mL Per Tube Q6H   hydrOXYzine  10 mg Per Tube TID   leptospermum manuka honey  1 Application Topical Daily   levETIRAcetam  1,000 mg Per Tube BID   metoprolol tartrate  25 mg Per Tube BID   mouth rinse  15 mL Mouth Rinse 4 times per day   phosphorus  500 mg Oral TID   potassium chloride  40 mEq Per Tube Q4H   simvastatin  10 mg Per Tube QHS   sodium chloride flush  10-40 mL Intracatheter Q12H   valproic acid  500 mg Per Tube TID   valproic acid  750 mg Per Tube QHS   Continuous Infusions:  feeding supplement (OSMOLITE 1.2 CAL) 1,000 mL (11/26/22 1940)   PRN  Meds:.dextrose, HYDROcodone-acetaminophen, lip balm, metoprolol tartrate, morphine injection, mouth rinse    Objective:   Vitals:   11/27/22 0600 11/27/22 0728 11/27/22 0730 11/27/22 1159  BP:    123/80  Pulse: 60   70  Resp: 20   20  Temp:  (!) 97 F (36.1 C)  98.2 F (36.8 C)  TempSrc:  Oral Oral Oral  SpO2: 90%   98%  Weight:        Wt Readings from Last 3 Encounters:  11/26/22 88 kg  05/16/22 82 kg  02/21/22 81.2 kg     Intake/Output Summary (Last 24 hours) at 11/27/2022 1404 Last data filed at 11/26/2022 1700 Gross per 24 hour  Intake 2000 ml  Output 450 ml  Net 1550 ml     Physical  Exam  Sleeping in bed, sleeping comfortably, unable to answer any questions, or follow any commands,  Symmetrical Chest wall movement, Good air movement bilaterally, CTAB RRR,No Gallops,Rubs or new Murmurs, No Parasternal Heave +ve B.Sounds, Abd Soft, No tenderness, No rebound - guarding or rigidity. 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 (Active)  02/15/22 2200  Location: Sacrum  Location Orientation: Medial  Staging: Stage 4 - Full thickness tissue loss with exposed bone, tendon or muscle.  Wound Description (Comments):   Present on Admission: Yes  Dressing Type Foam - Lift dressing to assess site every shift 11/27/22 0730      Data Review:    Recent Labs  Lab 11/23/22 0023 11/24/22 0418 11/25/22 0631 11/26/22 0602 11/27/22 0417  WBC 8.5 7.8 7.8 10.1 10.1  HGB 13.0 11.4* 12.8 11.6* 10.9*  HCT 37.9 34.8* 37.7 35.1* 31.7*  PLT 157 128* 151 143* 142*  MCV 92.0 93.5 92.6 93.6 92.7  MCH 31.6 30.6 31.4 30.9 31.9  MCHC 34.3 32.8 34.0 33.0 34.4  RDW 15.5 15.4 15.8* 15.9* 15.9*  LYMPHSABS  --   --   --   --  4.5*  MONOABS  --   --   --   --  0.8  EOSABS  --   --   --   --  0.2  BASOSABS  --   --   --   --  0.0    Recent Labs  Lab 11/23/22 0016 11/23/22 0023 11/24/22 0418 11/25/22 0500 11/25/22 0631 11/26/22 0259 11/26/22 0602 11/26/22 1043 11/27/22 0417  NA  --  135 138  --  134*  --  132*  --  136  K  --  3.3* 2.9*  --  3.8  --  3.3*  --  3.2*  CL  --  101 102  --  100  --  97*  --  97*  CO2  --  27 30  --  30  --  28  --  31  ANIONGAP  --  7 6   --  4*  --  7  --  8  GLUCOSE  --  92 106*  --  121*  --  153*  --  84  BUN  --  5* 5*  --  <  5*  --  10  --  14  CREATININE  --  0.50 0.38*  --  0.36*  --  0.42*  --  0.42*  ALBUMIN  --   --   --  1.8*  --   --   --   --  <1.5*  CRP  --   --   --   --   --   --  7.0*  --   --   PROCALCITON  --   --   --   --   --   --  <0.10  --   --   TSH  --   --   --   --   --   --   --  2.504  --   HGBA1C  --   --   --   --   --   --   --  5.4  --   BNP  --   --   --   --   --   --  209.9*  --  171.6*  MG 1.6*  --  1.9  --  1.6* 2.1  --   --  1.9  CALCIUM  --  7.6* 7.5*  --  7.7*  --  7.4*  --  7.4*     Recent Labs  Lab 11/23/22 0016 11/23/22 0023 11/24/22 0418 11/25/22 0631 11/26/22 0259 11/26/22 0602 11/26/22 1043 11/27/22 0417  CRP  --   --   --   --   --  7.0*  --   --   PROCALCITON  --   --   --   --   --  <0.10  --   --   TSH  --   --   --   --   --   --  2.504  --   HGBA1C  --   --   --   --   --   --  5.4  --   BNP  --   --   --   --   --  209.9*  --  171.6*  MG 1.6*  --  1.9 1.6* 2.1  --   --  1.9  CALCIUM  --  7.6* 7.5* 7.7*  --  7.4*  --  7.4*     Radiology Reports DG Abd Portable 1V  Result Date: 11/26/2022 CLINICAL DATA:  Ileus. EXAM: PORTABLE ABDOMEN - 1 VIEW COMPARISON:  November 23, 2022. FINDINGS: Stable gaseous distention of the colon. No definite small bowel dilatation is noted. Postsurgical changes noted in lower lumbar spine. IMPRESSION: Stable gaseous distention of the colon. Electronically Signed   By: Lupita Raider M.D.   On: 11/26/2022 11:35   DG CHEST PORT 1 VIEW  Result Date: 11/25/2022 CLINICAL DATA:  Pneumonia EXAM: PORTABLE CHEST 1 VIEW COMPARISON:  X-ray 11/23/2022 and older FINDINGS: Underinflation. Enlarged cardiopericardial silhouette with calcified aorta. Trace vascular congestion versus bronchovascular crowding. No pneumothorax or effusion. Right-sided PICC in place with the tip of the upper right atrium and left-sided dual lead pacemaker. IMPRESSION:  Poor inflation with elevated right hemidiaphragm and either vascular congestion or bronchovascular crowding. Recommend follow up. Right-sided PICC with the tip of the upper right atrium Electronically Signed   By: Karen Kays M.D.   On: 11/25/2022 10:59      Signature  -   Huey Bienenstock M.D on 11/27/2022 at 2:04 PM   -  To page go to [email or web address]

## 2022-11-28 DIAGNOSIS — J9601 Acute respiratory failure with hypoxia: Secondary | ICD-10-CM | POA: Diagnosis not present

## 2022-11-28 DIAGNOSIS — R7889 Finding of other specified substances, not normally found in blood: Secondary | ICD-10-CM | POA: Diagnosis not present

## 2022-11-28 LAB — CBC WITH DIFFERENTIAL/PLATELET
Abs Immature Granulocytes: 0.03 10*3/uL (ref 0.00–0.07)
Basophils Absolute: 0 10*3/uL (ref 0.0–0.1)
Basophils Relative: 0 %
Eosinophils Absolute: 0.4 10*3/uL (ref 0.0–0.5)
Eosinophils Relative: 4 %
HCT: 34.3 % — ABNORMAL LOW (ref 36.0–46.0)
Hemoglobin: 11.2 g/dL — ABNORMAL LOW (ref 12.0–15.0)
Immature Granulocytes: 0 %
Lymphocytes Relative: 33 %
Lymphs Abs: 2.7 10*3/uL (ref 0.7–4.0)
MCH: 31 pg (ref 26.0–34.0)
MCHC: 32.7 g/dL (ref 30.0–36.0)
MCV: 95 fL (ref 80.0–100.0)
Monocytes Absolute: 0.8 10*3/uL (ref 0.1–1.0)
Monocytes Relative: 9 %
Neutro Abs: 4.5 10*3/uL (ref 1.7–7.7)
Neutrophils Relative %: 54 %
Platelets: 140 10*3/uL — ABNORMAL LOW (ref 150–400)
RBC: 3.61 MIL/uL — ABNORMAL LOW (ref 3.87–5.11)
RDW: 16.4 % — ABNORMAL HIGH (ref 11.5–15.5)
WBC: 8.4 10*3/uL (ref 4.0–10.5)
nRBC: 0 % (ref 0.0–0.2)

## 2022-11-28 LAB — BASIC METABOLIC PANEL
Anion gap: 5 (ref 5–15)
BUN: 17 mg/dL (ref 8–23)
CO2: 31 mmol/L (ref 22–32)
Calcium: 7.3 mg/dL — ABNORMAL LOW (ref 8.9–10.3)
Chloride: 103 mmol/L (ref 98–111)
Creatinine, Ser: 0.41 mg/dL — ABNORMAL LOW (ref 0.44–1.00)
GFR, Estimated: >60 mL/min (ref >60)
Glucose, Bld: 111 mg/dL — ABNORMAL HIGH (ref 70–99)
Potassium: 3.5 mmol/L (ref 3.5–5.1)
Sodium: 139 mmol/L (ref 135–145)

## 2022-11-28 LAB — BRAIN NATRIURETIC PEPTIDE: B Natriuretic Peptide: 133.3 pg/mL — ABNORMAL HIGH (ref 0.0–100.0)

## 2022-11-28 LAB — GLUCOSE, CAPILLARY
Glucose-Capillary: 107 mg/dL — ABNORMAL HIGH (ref 70–99)
Glucose-Capillary: 108 mg/dL — ABNORMAL HIGH (ref 70–99)
Glucose-Capillary: 123 mg/dL — ABNORMAL HIGH (ref 70–99)
Glucose-Capillary: 80 mg/dL (ref 70–99)
Glucose-Capillary: 85 mg/dL (ref 70–99)
Glucose-Capillary: 90 mg/dL (ref 70–99)
Glucose-Capillary: 92 mg/dL (ref 70–99)

## 2022-11-28 LAB — MAGNESIUM: Magnesium: 1.8 mg/dL (ref 1.7–2.4)

## 2022-11-28 NOTE — Plan of Care (Signed)

## 2022-11-28 NOTE — Progress Notes (Signed)
PROGRESS NOTE                                                                                                                                                                                                             Patient Demographics:    Kathleen Jensen, is a 72 y.o. female, DOB - 03-27-1951, ZOX:[id number]  Outpatient Primary MD for the patient is Patient, No Pcp Per    LOS - 12  Admit date - 11/16/2022    Chief Complaint  Patient presents with   Aspiration       Brief Narrative (HPI from H&P)    72 y.o. 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  She has a history of A-fib, pacemaker in place, seizures, HTN, DM, hypothyroidism, anemia. She has a PEG tube in place for approximately the last year due to poor p.o. intake.  She has had a stepwise decline over the last year following sepsis from UTI and other infectious processes.    According to the daughter patient lives at nursing home, last time she was able to feed herself was close to 1 year ago, since then her quality of life has been going down, she was brought to the hospital from SNF with respiratory distress, acute hypoxic respiratory failure requiring nonrebreather and changes suggestive of aspiration pneumonia.  She was transferred to my care on 11/26/2022 on day 10 of hospital stay.   Subjective:    Kathleen Jensen today in bed, in no distress, unable to answer questions or follow commands.  No significant events overnight as discussed with staff   Assessment  & Plan :    Acute hypoxic respiratory failure present on admission due to aspiration pneumonia in a patient with stroke, right-sided hemiparesis, expressive aphasia, dysphagia who is PEG tube dependent  She has been placed on 10 days of antibiotic finishing her course on 11/28/2022, currently no signs of active infection, no fever  or leukocytosis, continue feeding through PEG tube, continue aspiration precautions.  Baseline quality of life is poor.  Seen by palliative care continue present line of treatment.  Respiratory failure has improved, supplemental nasal cannula oxygen if required.  Initially was on nonrebreather in ER now much improved.  SpO2: 98 % O2 Flow Rate (L/min): 2 L/min FiO2 (%):  100 %   History of seizures (HCC)  -  -  On Depakote , keppra, and Dilantin. -Dilantin remains on hold significantly supratherapeutic level, will recheck Dilantin trough tomorrow and resume at a lower dose if level is appropriate.  Abdominal distention  -X-ray significant for gaseous distention, but she has been tolerating her tube feed.    History of cerebral infarction with baseline right-sided hemiparesis, expressive aphasia and dysphagia.  PEG tube dependent.  Supportive care.  Poor quality of life at baseline.  Continue aspirin and statin for secondary prevention.  Also history of intracerebral hemorrhage in the past.   Essential hypertension  - BP is controlled on metoprolol  Hypothyroidism -apparently Synthroid has been stopped by her PCP, TSH within normal limit during hospital stay, will defer to PCP as an outpatient.  History of paroxysmal atrial fibrillation in July 2023 advised to score of greater than 5.  Seen by cardiology in the past no anticoagulation as high risk for intracerebral bleed.  Continue amiodarone, beta-blocker, aspirin and monitor.    History of sacral decubitus ulcers present upon admission.  Currently see nursing note for details.  Wound care to continue.    Hypokalemia.  Replaced.    Diabetes mellitus without complication (HCC) Not on insulin, PEG tube feeds, few episodes of hypoglycemia, could have been due to held tube feeds on 11/24/2022 however will check baseline TSH and cortisol levels are appropriate  Lab Results  Component Value Date   HGBA1C 5.4 11/26/2022   CBG (last 3)  Recent  Labs    11/28/22 0424 11/28/22 0758 11/28/22 1138  GLUCAP 92 107* 85   Lab Results  Component Value Date   TSH 2.504 11/26/2022           Condition - Extremely Guarded  Family Communication  : None at bedside  Code Status :  DNR  Consults  :  Pall Care  PUD Prophylaxis : PPI   Procedures  :            Disposition Plan  :    Status is: Inpatient   DVT Prophylaxis  :    SCDs Start: 11/16/22 2214    Lab Results  Component Value Date   PLT 140 (L) 11/28/2022    Diet :  Diet Order             Diet NPO time specified  Diet effective now                    Inpatient Medications  Scheduled Meds:  acetaminophen  640 mg Per Tube Q8H   amiodarone  200 mg Per Tube Daily   aspirin  81 mg Per Tube QHS   bisacodyl  10 mg Rectal Once   Chlorhexidine Gluconate Cloth  6 each Topical Daily   feeding supplement (PROSource TF20)  60 mL Per Tube Daily   free water  100 mL Per Tube Q6H   hydrOXYzine  10 mg Per Tube TID   leptospermum manuka honey  1 Application Topical Daily   levETIRAcetam  1,000 mg Per Tube BID   metoprolol tartrate  25 mg Per Tube BID   mouth rinse  15 mL Mouth Rinse 4 times per day   phosphorus  500 mg Oral TID   simvastatin  10 mg Per Tube QHS   sodium chloride flush  10-40 mL Intracatheter Q12H   valproic acid  500 mg Per Tube TID   valproic acid  750 mg Per  Tube QHS   Continuous Infusions:  feeding supplement (OSMOLITE 1.2 CAL) 1,000 mL (11/26/22 1940)   PRN Meds:.dextrose, HYDROcodone-acetaminophen, lip balm, metoprolol tartrate, morphine injection, mouth rinse    Objective:   Vitals:   11/28/22 0400 11/28/22 0758 11/28/22 0818 11/28/22 1139  BP: 123/65 (!) 117/56 (!) 119/54   Pulse: 60  64   Resp: 20     Temp: 98.1 F (36.7 C) 98 F (36.7 C)  98.6 F (37 C)  TempSrc: Oral Oral  Oral  SpO2: 98%     Weight:        Wt Readings from Last 3 Encounters:  11/26/22 88 kg  05/16/22 82 kg  02/21/22 81.2 kg      Intake/Output Summary (Last 24 hours) at 11/28/2022 1334 Last data filed at 11/28/2022 0800 Gross per 24 hour  Intake --  Output 1000 ml  Net -1000 ml     Physical Exam  Sleeping in bed, sleeping comfortably, unable to answer any questions, or follow any commands,  Symmetrical Chest wall movement, Good air movement bilaterally, CTAB RRR,No Gallops,Rubs or new Murmurs, No Parasternal Heave +ve B.Sounds, Abd Soft, No tenderness, No rebound - guarding or rigidity. 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 (Active)  02/15/22 2200  Location: Sacrum  Location Orientation: Medial  Staging: Stage 4 - Full thickness tissue loss with exposed bone, tendon or muscle.  Wound Description (Comments):   Present on Admission: Yes  Dressing Type Foam - Lift dressing to assess site every shift 11/28/22 0800      Data Review:    Recent Labs  Lab 11/24/22 0418 11/25/22 0631 11/26/22 0602 11/27/22 0417 11/28/22 0507  WBC 7.8 7.8 10.1 10.1 8.4  HGB 11.4* 12.8 11.6* 10.9* 11.2*  HCT 34.8* 37.7 35.1* 31.7* 34.3*  PLT 128* 151 143* 142* 140*  MCV 93.5 92.6 93.6 92.7 95.0  MCH 30.6 31.4 30.9 31.9 31.0  MCHC 32.8 34.0 33.0 34.4 32.7  RDW 15.4 15.8* 15.9* 15.9* 16.4*  LYMPHSABS  --   --   --  4.5* 2.7  MONOABS  --   --   --  0.8 0.8  EOSABS  --   --   --  0.2 0.4  BASOSABS  --   --   --  0.0 0.0    Recent Labs  Lab 11/24/22 0418 11/25/22 0500 11/25/22 0631 11/26/22 0259 11/26/22 0602 11/26/22 1043 11/27/22 0417 11/28/22 0507  NA 138  --  134*  --  132*  --  136  139  K 2.9*  --  3.8  --  3.3*  --  3.2* 3.5  CL 102  --  100  --  97*  --  97* 103  CO2 30  --  30  --  28  --  31 31  ANIONGAP 6  --  4*  --  7  --  8 5  GLUCOSE 106*  --  121*  --  153*  --  84 111*  BUN 5*  --  <5*  --  10  --  14 17  CREATININE 0.38*  --  0.36*  --  0.42*  --  0.42* 0.41*  ALBUMIN  --  1.8*  --   --   --   --  <1.5*  --   CRP  --   --   --   --  7.0*  --   --   --   PROCALCITON  --   --   --   --  <0.10  --   --   --   TSH  --   --   --   --   --  2.504  --   --   HGBA1C  --   --   --   --   --  5.4  --   --   BNP  --   --   --   --  209.9*  --  171.6* 133.3*  MG 1.9  --  1.6* 2.1  --   --  1.9 1.8  CALCIUM 7.5*  --  7.7*  --  7.4*  --  7.4* 7.3*     Recent Labs  Lab 11/24/22 0418 11/25/22 0631 11/26/22 0259 11/26/22 0602 11/26/22 1043 11/27/22 0417 11/28/22 0507  CRP  --   --   --  7.0*  --   --   --   PROCALCITON  --   --   --  <0.10  --   --   --   TSH  --   --   --   --  2.504  --   --   HGBA1C  --   --   --   --  5.4  --   --   BNP  --   --   --  209.9*  --  171.6* 133.3*  MG 1.9 1.6* 2.1  --   --  1.9 1.8  CALCIUM 7.5* 7.7*  --  7.4*  --  7.4* 7.3*     Radiology Reports DG Abd Portable 1V  Result Date: 11/26/2022 CLINICAL DATA:  Ileus. EXAM: PORTABLE ABDOMEN - 1 VIEW COMPARISON:  November 23, 2022. FINDINGS: Stable gaseous distention of the colon. No definite small bowel dilatation is noted. Postsurgical changes noted in lower lumbar spine. IMPRESSION: Stable gaseous distention of the colon. Electronically Signed   By: Lupita Raider M.D.   On: 11/26/2022 11:35   DG CHEST PORT 1 VIEW  Result Date: 11/25/2022 CLINICAL DATA:  Pneumonia EXAM: PORTABLE CHEST 1 VIEW COMPARISON:  X-ray 11/23/2022 and older FINDINGS: Underinflation. Enlarged cardiopericardial silhouette with calcified aorta. Trace vascular congestion versus bronchovascular crowding. No pneumothorax or effusion. Right-sided PICC in place with the tip of the upper right atrium and  left-sided dual lead pacemaker. IMPRESSION: Poor inflation with elevated right hemidiaphragm and either vascular congestion or bronchovascular crowding. Recommend follow up. Right-sided PICC with the tip of the upper right atrium Electronically Signed   By: Karen Kays M.D.   On: 11/25/2022 10:59      Signature  -   Mliss Fritz Shaquera Ansley M.D on 11/28/2022 at 1:34 PM   -  To page go to [email or web address]

## 2022-11-28 NOTE — Progress Notes (Signed)
Brief Palliative Medicine Progress Note:  PMT following peripherally for needs/decline.  Medical records reviewed including progress notes, labs, imaging. Patient will likely be medically stable for discharge soon.   Confirmed with [facility] liaison that patient is currently still enrolled in their outpatient Palliative Care services. Requested one of their providers follow up with patient/family sooner than later after discharge for ongoing support and GOC, if able.  Goals throughout hospitalization have been clear for DNR/DNI and gentle medical treatment. HCPOA previously indicated if patient survived hospitalization, she would want to rescind DNR at discharge. Outpatient PC to continue GOC discussions.  PMT will continue to follow peripherally. If there are any imminent needs please call the service directly. Family also has PMT contact information should further needs arise.  Thank you for allowing PMT to assist in the care of this patient.  Shakima Nisley M. Katrinka Blazing [facility] Palliative Medicine Team Team Phone: [phone] NO CHARGE

## 2022-11-28 NOTE — Plan of Care (Signed)
  Problem: Education: Goal: Knowledge of General Education information will improve Description Including pain rating scale, medication(s)/side effects and non-pharmacologic comfort measures Outcome: Progressing   

## 2022-11-28 NOTE — [place] FL2 (Signed)
[place] MEDICAID FL2 LEVEL OF CARE FORM     IDENTIFICATION  Patient Name: Kathleen Jensen Birthdate: April 21, 1951 Sex: female Admission Date (Current Location): 11/16/2022  [facility] and [place] Number:  Producer, television/film/video and Address:  [facility] [place], [address] [place], [place] [address]      Provider Number: [id number]  Attending Physician Name and Address:  Elgergawy, Leana Roe, MD  Relative Name and Phone Number:       Current Level of Care: Hospital Recommended Level of Care: Skilled Nursing Facility Prior Approval Number:    Date Approved/Denied:   PASRR Number: [id number]  Discharge Plan: SNF    Current Diagnoses: Patient Active Problem List   Diagnosis Date Noted   Acute respiratory failure (HCC) 11/16/2022   Abdominal distention 11/16/2022   AMS (altered mental status) 05/17/2022   Altered mental status 05/15/2022   Sacral wound 05/15/2022   Intracranial hemorrhage (HCC)    Pressure injury of skin 02/18/2022   Chronic indwelling Foley catheter 02/18/2022   PAF (paroxysmal atrial fibrillation) (HCC) 02/18/2022   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HCC) 02/18/2022   Septic shock (HCC) 02/15/2022   Constipation    Abdominal pain    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   Right hemiplegia (HCC) 04/12/2020   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   Hyperlipidemia associated with type 2 diabetes mellitus (HCC) 07/30/2018   History of cerebral infarction 07/30/2018   Vitamin B 12 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/30/2018   Asthma 03/13/2018   Seizures (HCC) 01/12/2018   CVA (cerebral vascular accident) (HCC) 12/08/2017   Abnormal thyroid blood test 12/08/2017   Pulmonary embolism and infarction (HCC) 12/08/2017   Anemia 12/08/2017   Expressive aphasia 10/15/2017   Seizure disorder (HCC) 10/15/2017   Dysphagia 10/15/2017   Diabetes mellitus without complication (HCC)    Hyperlipidemia 09/22/2017    Urinary frequency 09/22/2017   Airway hyperreactivity 01/26/2016   Disease of thyroid gland 01/26/2016   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   Other specified postprocedural states 01/26/2016   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   Chronic LBP 04/10/2015   Degeneration of intervertebral disc of lumbar region 04/10/2015   Spondylolisthesis of lumbar region 04/10/2015   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   History of cardiac pacemaker in situ 01/09/2015   B12 DEFICIENCY 08/15/2008   UNSPECIFIED VITAMIN D DEFICIENCY 08/15/2008   SINUS BRADYCARDIA 08/15/2008   OBSTRUCTIVE SLEEP APNEA 08/11/2008   INSOMNIA 04/28/2008   Hyperlipidemia LDL goal <70 12/12/2006   RHINITIS, ALLERGIC NEC 12/12/2006   Hypothyroidism 12/11/2006   Essential hypertension 12/11/2006   GERD 12/11/2006   STRESS INCONTINENCE 12/11/2006    Orientation RESPIRATION BLADDER Height & Weight      (Disoriented x4)  Normal Continent, Indwelling catheter Weight: 194 lb 0.1 oz (88 kg) Height:     BEHAVIORAL SYMPTOMS/MOOD NEUROLOGICAL BOWEL NUTRITION STATUS    Convulsions/Seizures Incontinent (Fecal management system) Feeding tube  AMBULATORY STATUS COMMUNICATION OF NEEDS Skin   Total Care   PU Stage and Appropriate Care (Stage IV on sacrum)                       Personal Care Assistance Level of Assistance  Bathing, Feeding, Dressing Bathing Assistance: Maximum assistance Feeding assistance: Maximum assistance Dressing Assistance: Maximum assistance     Functional Limitations Info             SPECIAL CARE FACTORS FREQUENCY  Contractures Contractures Info: Not present    Additional Factors Info  Code Status, Allergies Code Status Info: DNR Allergies Info: Tape, Demerol (Meperidine Hcl), Sulfonamide Derivatives, Bacitracin-polymyxin B, Oxycontin (Oxycodone)           Current Medications (11/28/2022):  This is the current hospital active medication list Current  Facility-Administered Medications  Medication Dose Route Frequency Provider Last Rate Last Admin   acetaminophen (TYLENOL) 160 MG/5ML solution 640 mg  640 mg Per Tube Q8H Samtani, Jai-Gurmukh, MD   640 mg at 11/28/22 1304   amiodarone (PACERONE) tablet 200 mg  200 mg Per Tube Daily Rhetta Mura, MD   200 mg at 11/28/22 0818   aspirin chewable tablet 81 mg  81 mg Per Tube QHS Rhetta Mura, MD   81 mg at 11/27/22 2257   bisacodyl (DULCOLAX) suppository 10 mg  10 mg Rectal Once Leroy Sea, MD       Chlorhexidine Gluconate Cloth 2 % PADS 6 each  6 each Topical Daily Rhetta Mura, MD   6 each at 11/28/22 0820   dextrose 50 % solution 50 mL  50 mL Intravenous PRN Dow Adolph N, DO   50 mL at 11/27/22 0622   feeding supplement (OSMOLITE 1.2 CAL) liquid 1,000 mL  1,000 mL Per Tube Continuous Leroy Sea, MD 65 mL/hr at 11/28/22 1620 1,000 mL at 11/28/22 1620   feeding supplement (PROSource TF20) liquid 60 mL  60 mL Per Tube Daily Rhetta Mura, MD   60 mL at 11/28/22 0820   free water 100 mL  100 mL Per Tube Q6H Rhetta Mura, MD   100 mL at 11/28/22 1216   HYDROcodone-acetaminophen (NORCO/VICODIN) 5-325 MG per tablet 1 tablet  1 tablet Per Tube Daily PRN Rhetta Mura, MD   1 tablet at 11/24/22 2215   hydrOXYzine (ATARAX) tablet 10 mg  10 mg Per Tube TID Rhetta Mura, MD   10 mg at 11/28/22 1621   leptospermum manuka honey (MEDIHONEY) paste 1 Application  1 Application Topical Daily Rhetta Mura, MD   1 Application at 11/28/22 1610   levETIRAcetam (KEPPRA) tablet 1,000 mg  1,000 mg Per Tube BID Rhetta Mura, MD   1,000 mg at 11/28/22 0818   lip balm (CARMEX) ointment   Topical PRN Rhetta Mura, MD       metoprolol tartrate (LOPRESSOR) injection 5 mg  5 mg Intravenous Q6H PRN Rhetta Mura, MD       metoprolol tartrate (LOPRESSOR) tablet 25 mg  25 mg Per Tube BID Rhetta Mura, MD   25 mg at 11/27/22  1018   morphine (PF) 2 MG/ML injection 2 mg  2 mg Intravenous Q4H PRN Rhetta Mura, MD   2 mg at 11/25/22 1138   Oral care mouth rinse  15 mL Mouth Rinse 4 times per day Rhetta Mura, MD   15 mL at 11/28/22 1621   Oral care mouth rinse  15 mL Mouth Rinse PRN Rhetta Mura, MD       phosphorus (K PHOS NEUTRAL) tablet 500 mg  500 mg Oral TID Rhetta Mura, MD   500 mg at 11/28/22 1621   simvastatin (ZOCOR) tablet 10 mg  10 mg Per Tube QHS Rhetta Mura, MD   10 mg at 11/27/22 2256   sodium chloride flush (NS) 0.9 % injection 10-40 mL  10-40 mL Intracatheter Q12H Rhetta Mura, MD   10 mL at 11/28/22 0821   valproic acid (DEPAKENE) 250 MG/5ML solution 500 mg  500 mg Per  Tube TID Rhetta Mura, MD   500 mg at 11/28/22 1632   valproic acid (DEPAKENE) 250 MG/5ML solution 750 mg  750 mg Per Tube QHS Rhetta Mura, MD   750 mg at 11/27/22 2255     Discharge Medications: Please see discharge summary for a list of discharge medications.  Relevant Imaging Results:  Relevant Lab Results:   Additional Information SS#: [id number]. [facility] Palliative to follow at SNF.  Mearl Latin, LCSW

## 2022-11-28 NOTE — TOC Progression Note (Signed)
Transition of Care [facility]) - Progression Note    Patient Details  Name: Kathleen Jensen MRN: [id number] Date of Birth: Jul 24, 1951  Transition of Care [facility]) CM/SW Contact  Mearl Latin, LCSW Phone Number: 11/28/2022, 4:58 PM  Clinical Narrative:    CSW spoke with patient's daughter and made her aware that patient may be stable to return to [facility] tomorrow. She appreciated call. Will continue to follow.    Expected Discharge Plan: Long Term Nursing Home Barriers to Discharge: Continued Medical Work up  Expected Discharge Plan and Services In-house Referral: Clinical Social Work, Hospice / Palliative Care   Post Acute Care Choice: Skilled Nursing Facility Living arrangements for the past 2 months: Skilled Nursing Facility                                       Social Determinants of Health (SDOH) Interventions SDOH Screenings   Food Insecurity: No Food Insecurity (05/16/2022)  Housing: Low Risk  (05/17/2022)  Transportation Needs: No Transportation Needs (05/17/2022)  Utilities: Not At Risk (05/17/2022)  Depression (PHQ2-9): Low Risk  (05/22/2019)  Tobacco Use: Medium Risk (11/16/2022)    Readmission Risk Interventions    02/18/2022    2:23 PM  Readmission Risk Prevention Plan  Transportation Screening Complete  PCP or Specialist Appt within 5-7 Days Complete  Home Care Screening Complete  Medication Review (RN CM) Complete

## 2022-11-28 NOTE — Discharge Instructions (Signed)
Follow with Primary MD  Get CBC, CMP, 2 view Chest X ray checked  by Primary MD next visit.   Disposition SNF   Diet: Strict nothing by mouth, all meds and feeding via PEG tube  On your next visit with your primary care physician please Get Medicines reviewed and adjusted.   Please request your Prim.MD to go over all Hospital Tests and Procedure/Radiological results at the follow up, please get all Hospital records sent to your Prim MD by signing hospital release before you go home.   If you experience worsening of your admission symptoms, develop shortness of breath, life threatening emergency, suicidal or homicidal thoughts you must seek medical attention immediately by calling 911 or calling your MD immediately  if symptoms less severe.  You Must read complete instructions/literature along with all the possible adverse reactions/side effects for all the Medicines you take and that have been prescribed to you. Take any new Medicines after you have completely understood and accpet all the possible adverse reactions/side effects.   Do not drive, operating heavy machinery, perform activities at heights, swimming or participation in water activities or provide baby sitting services if your were admitted for syncope or siezures until you have seen by Primary MD or a Neurologist and advised to do so again.  Do not drive when taking Pain medications.    Do not take more than prescribed Pain, Sleep and Anxiety Medications  Special Instructions: If you have smoked or chewed Tobacco  in the last 2 yrs please stop smoking, stop any regular Alcohol  and or any Recreational drug use.  Wear Seat belts while driving.   Please note  You were cared for by a hospitalist during your hospital stay. If you have any questions about your discharge medications or the care you received while you were in the hospital after you are discharged, you can call the unit and asked to speak with the hospitalist on  call if the hospitalist that took care of you is not available. Once you are discharged, your primary care physician will handle any further medical issues. Please note that NO REFILLS for any discharge medications will be authorized once you are discharged, as it is imperative that you return to your primary care physician (or establish a relationship with a primary care physician if you do not have one) for your aftercare needs so that they can reassess your need for medications and monitor your lab values.

## 2022-11-29 ENCOUNTER — Inpatient Hospital Stay (HOSPITAL_COMMUNITY): Payer: Medicare HMO

## 2022-11-29 DIAGNOSIS — J9601 Acute respiratory failure with hypoxia: Secondary | ICD-10-CM | POA: Diagnosis not present

## 2022-11-29 DIAGNOSIS — R569 Unspecified convulsions: Secondary | ICD-10-CM | POA: Diagnosis not present

## 2022-11-29 DIAGNOSIS — G40909 Epilepsy, unspecified, not intractable, without status epilepticus: Secondary | ICD-10-CM | POA: Diagnosis not present

## 2022-11-29 LAB — PHENYTOIN LEVEL, TOTAL: Phenytoin Lvl: 16.4 ug/mL (ref 10.0–20.0)

## 2022-11-29 LAB — GLUCOSE, CAPILLARY
Glucose-Capillary: 104 mg/dL — ABNORMAL HIGH (ref 70–99)
Glucose-Capillary: 110 mg/dL — ABNORMAL HIGH (ref 70–99)
Glucose-Capillary: 117 mg/dL — ABNORMAL HIGH (ref 70–99)
Glucose-Capillary: 121 mg/dL — ABNORMAL HIGH (ref 70–99)
Glucose-Capillary: 146 mg/dL — ABNORMAL HIGH (ref 70–99)
Glucose-Capillary: 78 mg/dL (ref 70–99)

## 2022-11-29 LAB — CBC WITH DIFFERENTIAL/PLATELET
Abs Immature Granulocytes: 0.05 10*3/uL (ref 0.00–0.07)
Basophils Absolute: 0 10*3/uL (ref 0.0–0.1)
Basophils Relative: 0 %
Eosinophils Absolute: 0.1 10*3/uL (ref 0.0–0.5)
Eosinophils Relative: 1 %
HCT: 32.7 % — ABNORMAL LOW (ref 36.0–46.0)
Hemoglobin: 10.7 g/dL — ABNORMAL LOW (ref 12.0–15.0)
Immature Granulocytes: 1 %
Lymphocytes Relative: 30 %
Lymphs Abs: 2.6 10*3/uL (ref 0.7–4.0)
MCH: 30.8 pg (ref 26.0–34.0)
MCHC: 32.7 g/dL (ref 30.0–36.0)
MCV: 94.2 fL (ref 80.0–100.0)
Monocytes Absolute: 1.1 10*3/uL — ABNORMAL HIGH (ref 0.1–1.0)
Monocytes Relative: 13 %
Neutro Abs: 4.8 10*3/uL (ref 1.7–7.7)
Neutrophils Relative %: 55 %
Platelets: 132 10*3/uL — ABNORMAL LOW (ref 150–400)
RBC: 3.47 MIL/uL — ABNORMAL LOW (ref 3.87–5.11)
RDW: 16.5 % — ABNORMAL HIGH (ref 11.5–15.5)
WBC: 8.8 10*3/uL (ref 4.0–10.5)
nRBC: 0 % (ref 0.0–0.2)

## 2022-11-29 LAB — BASIC METABOLIC PANEL
Anion gap: 4 — ABNORMAL LOW (ref 5–15)
BUN: 16 mg/dL (ref 8–23)
CO2: 30 mmol/L (ref 22–32)
Calcium: 7.5 mg/dL — ABNORMAL LOW (ref 8.9–10.3)
Chloride: 104 mmol/L (ref 98–111)
Creatinine, Ser: 0.39 mg/dL — ABNORMAL LOW (ref 0.44–1.00)
GFR, Estimated: >60 mL/min (ref >60)
Glucose, Bld: 131 mg/dL — ABNORMAL HIGH (ref 70–99)
Potassium: 3.8 mmol/L (ref 3.5–5.1)
Sodium: 138 mmol/L (ref 135–145)

## 2022-11-29 LAB — ALBUMIN: Albumin: 1.6 g/dL — ABNORMAL LOW (ref 3.5–5.0)

## 2022-11-29 LAB — MAGNESIUM: Magnesium: 2 mg/dL (ref 1.7–2.4)

## 2022-11-29 LAB — BRAIN NATRIURETIC PEPTIDE: B Natriuretic Peptide: 227.3 pg/mL — ABNORMAL HIGH (ref 0.0–100.0)

## 2022-11-29 MED ORDER — FUROSEMIDE 10 MG/ML IJ SOLN
20.0000 mg | Freq: Once | INTRAMUSCULAR | Status: AC
  Administered 2022-11-29: 20 mg via INTRAVENOUS
  Filled 2022-11-29: qty 2

## 2022-11-29 MED ORDER — MIDAZOLAM HCL 2 MG/2ML IJ SOLN: 1.0000 mg | INTRAMUSCULAR | Status: DC | PRN

## 2022-11-29 NOTE — Progress Notes (Signed)
EEG complete - results pending 

## 2022-11-29 NOTE — Progress Notes (Signed)
DPH 16.4>>corrected for albumin =  39  Cont to hold phenytoin Check level on 5/5  Ulyses Southward, PharmD, [place], AAHIVP, CPP Infectious Disease Pharmacist 11/29/2022 12:46 PM

## 2022-11-29 NOTE — Progress Notes (Signed)
DBIV: Found RUE PICC without cap on hub. Line was clamped, otherwise unremarkable. Hub cleaned with CHG, cap applied.  Appears pt is receiving PRN IV meds only. Please consider PICC removal.

## 2022-11-29 NOTE — Procedures (Addendum)
Patient Name: Kathleen Jensen  MRN: [id number]  Epilepsy Attending: Charlsie Quest  Referring Physician/Provider: Charlsie Quest, MD  Date: 11/29/2022 Duration: 23.42 mins  Patient history: 72 year old female with history of stroke and residual aphasia, seizures. EEG to evaluate for seizure.   Level of alertness: Awake  AEDs during EEG study: LEV, VPA, PGB  Technical aspects: This EEG study was done with scalp electrodes positioned according to the 10-20 International system of electrode placement. Electrical activity was reviewed with band pass filter of 1-70Hz , sensitivity of 7 uV/mm, display speed of 69mm/sec with a 60Hz  notched filter applied as appropriate. EEG data were recorded continuously and digitally stored.  Video monitoring was available and reviewed as appropriate.  Description:  No clear posterior dominant rhythm was seen. EEG showed continuous generalized and maximal left [posterior quadrant 3 to 6 Hz theta-delta slowing. Abundant spike were noted in left posterior quadrant. Photic driving was not seen during photic stimulation. Hyperventilation was not performed.    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 Additionally there is cortical dysfunction arising from left posterior quadrant likely due to underlying stroke. Lastly there is moderate diffuse encephalopathy, nonspecific etiology. No seizures were seen throughout the recording.   Sherrye Puga Annabelle Harman

## 2022-11-29 NOTE — Plan of Care (Signed)

## 2022-11-29 NOTE — TOC Progression Note (Signed)
Transition of Care [facility]) - Progression Note    Patient Details  Name: Kathleen Jensen MRN: [id number] Date of Birth: 1950/09/16  Transition of Care [facility]) CM/SW Contact  Mearl Latin, LCSW Phone Number: 11/29/2022, 2:52 PM  Clinical Narrative:    CSW updated [place] that patient is not ready to discharge today.   Expected Discharge Plan: Long Term Nursing Home Barriers to Discharge: Continued Medical Work up  Expected Discharge Plan and Services In-house Referral: Clinical Social Work, Hospice / Palliative Care   Post Acute Care Choice: Skilled Nursing Facility Living arrangements for the past 2 months: Skilled Nursing Facility                                       Social Determinants of Health (SDOH) Interventions SDOH Screenings   Food Insecurity: No Food Insecurity (05/16/2022)  Housing: Low Risk  (05/17/2022)  Transportation Needs: No Transportation Needs (05/17/2022)  Utilities: Not At Risk (05/17/2022)  Depression (PHQ2-9): Low Risk  (05/22/2019)  Tobacco Use: Medium Risk (11/16/2022)    Readmission Risk Interventions    02/18/2022    2:23 PM  Readmission Risk Prevention Plan  Transportation Screening Complete  PCP or Specialist Appt within 5-7 Days Complete  Home Care Screening Complete  Medication Review (RN CM) Complete

## 2022-11-29 NOTE — Consult Note (Addendum)
Neurology Consultation Reason for Consult: Elevated phenytoin level, seizures Referring Physician: Dr Huey Bienenstock  CC: Seizures, elevated phenytoin level  History is obtained from: Chart review as patient is aphasic/confused  HPI: Kathleen Jensen is a 72 y.o. female medical history of stroke with resultant aphasia and right-sided weakness, hypertension, hyperlipidemia, type 2 diabetes, atrial fibrillation with a pacemaker but not on anticoagulation, epilepsy who was brought in from [facility] due to gurgling sounds and foul-smelling urine.  She was diagnosed with acute hypoxic respiratory failure due to aspiration pneumonia and received antibiotics.  Dilantin levels were also checked and were noted to be supratherapeutic.  Therefore neurology was consulted for management of antiseizure medications.  From chart review, at baseline patient is awake and alert but aphasic, does not follow commands.  She was seen in October 2023 for altered mental status and phenytoin was reduced to 175 mg twice daily.   Home AEDs: Depakote 500 mg 3 times daily and 750 mg nightly, phenytoin 150 mg twice daily, Keppra 1000 mg twice daily, pregabalin 75 mg twice daily  ROS:  Unable to obtain due to altered mental status.   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)    Hyperlipemia    Hypertension    Insomnia    Intracerebral hemorrhage, intraventricular (HCC)    Muscle weakness    Other abnormalities of gait and mobility    Seizures (HCC)    Stroke (HCC)    Unsteadiness on feet    Vitamin B  deficiency     Family History  Problem Relation Age of Onset   High blood pressure Mother    Diabetes Mother    Diabetes Father    Stroke Father    Cancer Sister        Colorectal cancer   Cancer Brother        colon    Social History:  reports that she quit smoking about 43 years ago. She has never used smokeless tobacco. She reports that she does not drink alcohol and does not use drugs.  Medications Prior to Admission  Medication Sig Dispense Refill Last Dose   acetaminophen (TYLENOL) 160 MG/5ML liquid Place 960 mg into feeding tube every 8 (eight) hours.   Past Week   Acidophilus Lactobacillus CAPS Give 1 capsule by tube daily.   11/16/2022   amiodarone (PACERONE) 200 MG tablet 200 mg BID for 10 days then 200mg  daily for 21 days. Follow up with cardiology for further dosing (Patient taking differently: Place 200 mg into feeding tube daily.) 30 tablet 0 11/16/2022   ascorbic acid (VITAMIN C) 500 MG tablet Place 1 tablet (500 mg total) into feeding tube 2 (two) times daily. (Patient taking differently: Place 500 mg into feeding tube daily.) 30 tablet 0 11/16/2022   aspirin EC 81 MG tablet 81 mg at bedtime. Via tube   11/15/2022   Cholecalciferol 25 MCG (1000 UT) capsule Place 1,000 Units into feeding tube at bedtime.   11/15/2022   famotidine (PEPCID) 40 MG/5ML suspension Place 40 mg into feeding tube daily.  11/16/2022   folic acid (FOLVITE) 800 MCG tablet Place 800 mcg into feeding tube daily.   11/16/2022   HYDROcodone-acetaminophen (NORCO/VICODIN) 5-325 MG tablet Take 1 tablet by mouth daily as needed for moderate pain.   Past Week   hydrOXYzine (ATARAX) 10 MG tablet Place 10 mg into feeding tube in the morning, at noon, and at bedtime.   11/16/22   Menthol, Topical Analgesic, (BIOFREEZE) 4 % GEL Apply 2 g topically 2 (two) times daily. Upper left arm   11/16/2022   Nutritional Supplements (FEEDING SUPPLEMENT, JEVITY 1.2 CAL,) LIQD Place 1,000 mLs into feeding tube continuous. At 46ml/hr   0 11/16/2022   Nutritional Supplements (FEEDING SUPPLEMENT, PROSOURCE TF,) liquid Place 45 mLs into feeding tube 3 (three) times daily.   11/16/2022   phenytoin (DILANTIN) 125 MG/5ML suspension Place 150 mg into feeding tube 2 (two) times daily.   11/16/22   polyethylene glycol (MIRALAX / GLYCOLAX) 17 g packet Place 34 g into feeding tube daily as needed for mild constipation.   11/16/2022   senna-docusate (SENOKOT-S) 8.6-50 MG tablet Place 2 tablets into feeding tube 2 (two) times daily.   11/16/2022   Simethicone 80 MG TABS 1 tablet (80 mg total) by PEG Tube route in the morning and at bedtime. (Patient taking differently: Give 80 mg by tube in the morning and at bedtime. Simethicone 80mg  chewable tablet)  0 11/16/2022   simvastatin (ZOCOR) 10 MG tablet Place 1 tablet (10 mg total) into feeding tube at bedtime.   11/15/2022   valproic acid (DEPAKENE) 250 MG/5ML solution Take 500-750 mg by mouth 4 (four) times daily. Give 500mg  three times daily and 750mg  at bedtime   4/20   Water For Irrigation, Sterile (FREE WATER) SOLN Place 100 mLs into feeding tube every 4 (four) hours.   11/16/2022   Zinc 50 MG TABS Give 50 mg by tube daily.   11/16/2022   levETIRAcetam (KEPPRA) 100 MG/ML solution Place 1,000 mg into feeding tube 2 (two) times daily.   11/16/22   levothyroxine (SYNTHROID) 75 MCG tablet Place 1 tablet (75 mcg total) into feeding tube daily before breakfast. (Patient not taking: Reported on 11/22/2022)   Not Taking   metoprolol tartrate (LOPRESSOR) 25 MG tablet Place 12.5 mg into feeding tube 2 (two) times daily.   11/16/22   pregabalin (LYRICA) 75 MG capsule Take 75 mg by mouth 3 (three) times daily.   11/16/22      Exam: Current vital signs: BP 121/71 (BP Location: Left Arm)   Pulse 60   Temp 98.5 F (36.9 C) (Oral)   Resp 18   Wt 88 kg   SpO2 96%   BMI 32.28 kg/m  Vital signs in last 24 hours: Temp:  [98 F (36.7 C)-98.7 F (37.1 C)] 98.5 F (36.9 C) (05/03 1116) Pulse Rate:  [60-64] 60  (05/03 0400) Resp:  [18-20] 18 (05/03 0400) BP: (121-132)/(71-80) 121/71 (05/03 0803) SpO2:  [93 %-100 %] 96 % (05/03 0400)   Physical Exam  Constitutional: Awake, laying in bed, not in apparent distress Neuro: Awake, does not look at examiner or track examiner in room, blinks eyes to threat bilaterally, PERRLA, no apparent facial asymmetry, moans loudly with any noxious stimulation with withdrawal in all 4 extremities  I have reviewed labs in epic and the results pertinent to this consultation are: CBC:  Recent Labs  Lab 11/28/22 0507 11/29/22 0426  WBC 8.4 8.8  NEUTROABS 4.5 4.8  HGB 11.2* 10.7*  HCT 34.3* 32.7*  MCV 95.0 94.2  PLT 140* 132*    Basic Metabolic Panel:  Lab Results  Component Value Date   NA 138 11/29/2022   K 3.8 11/29/2022   CO2 30 11/29/2022   GLUCOSE 131 (H) 11/29/2022   BUN 16 11/29/2022   CREATININE 0.39 (L) 11/29/2022   CALCIUM 7.5 (L) 11/29/2022   GFRNONAA >60 11/29/2022   GFRAA >60 04/14/2020   Lipid Panel:  Lab Results  Component Value Date   LDLCALC 90 07/30/2018   HgbA1c:  Lab Results  Component Value Date   HGBA1C 5.4 11/26/2022   Urine Drug Screen:     Component Value Date/Time   LABOPIA NONE DETECTED 11/16/2022 2223   COCAINSCRNUR NONE DETECTED 11/16/2022 2223   LABBENZ NONE DETECTED 11/16/2022 2223   AMPHETMU NONE DETECTED 11/16/2022 2223   THCU NONE DETECTED 11/16/2022 2223   LABBARB NONE DETECTED 11/16/2022 2223    Alcohol Level     Component Value Date/Time   ETH <10 11/16/2022 1411     I have reviewed the images obtained:  CT Head without contrast 11/16/2022: No acute intracranial abnormality. Chronic microvascular ischemic change and cerebral volume loss. Remote left parietal lobe infarct.   ASSESSMENT/PLAN: 72 year old female with history of stroke and residual aphasia, seizures who was admitted for respiratory failure, see treatment for pneumonia.  During admission phenytoin level was noted to be  supratherapeutic.  Neurology was consulted for adjustment in antiseizure medications.  Epilepsy Stroke with residual aphasia -Patient is on multiple antiseizure medications specifically Dilantin and Depakote which have interactions.  Recommendations: -On exam, I do not see any clinical signs of phenytoin toxicity.  I also do not typically recommend adjusting doses as long as patient is seizure-free and do not have any signs of toxicity.  However, due to Dilantin and Depakote interaction, total level may not be accurate and free level might be even higher.  Therefore, I do agree with holding off phenytoin for now and checking free levels -Additionally, as patient has remained seizure-free, can resume phenytoin at lower dose of 100 mg every morning and 150 mg nightly after checking levels on 5/5. -Continue rest of the antiseizure medications -Will obtain routine EEG to make sure seizures are not contributing to current mental status -Continue seizure precautions -As needed IV Versed for clinical seizures -Management of rest of comorbidities per primary team   Thank you for allowing [place] to participate in the care of this patient. If you have any further questions, please contact  me or neurohospitalist.   I have spent a total of 80  minutes with the patient reviewing hospital notes,  test results, labs and examining the patient as well as establishing an assessment and plan.  Spent over 35 minutes reviewing patient's prior neurology notes, inpatient and outpatient.> 50% of time was spent in direct patient care.    Lindie Spruce Epilepsy [facility] neurohospitalist

## 2022-11-29 NOTE — Progress Notes (Signed)
PROGRESS NOTE                                                                                                                                                                                                             Patient Demographics:    Kathleen Jensen, is a 72 y.o. female, DOB - 1951/07/20, ZOX:[id number]  Outpatient Primary MD for the patient is Patient, No Pcp Per    LOS - 13  Admit date - 11/16/2022    Chief Complaint  Patient presents with   Aspiration       Brief Narrative (HPI from H&P)     72 y.o. 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  She has a history of A-fib, pacemaker in place, seizures, HTN, DM, hypothyroidism, anemia. She has a PEG tube in place for approximately the last year due to poor p.o. intake.  She has had a stepwise decline over the last year following sepsis from UTI and other infectious processes.    According to the daughter patient lives at nursing home, last time she was able to feed herself was close to 1 year ago, since then her quality of life has been going down, she was brought to the hospital from SNF with respiratory distress, acute hypoxic respiratory failure requiring nonrebreather and changes suggestive of aspiration pneumonia.  As well she has been seen by neurology service for evaluation and adjustment of her AEDs.   Subjective:    [name] today in bed, in no distress, unable to answer questions or follow commands.  No significant events overnight as discussed with staff   Assessment  & Plan :    Acute hypoxic respiratory failure present on admission due to aspiration pneumonia in a patient with stroke, right-sided hemiparesis, expressive aphasia, dysphagia who is PEG tube dependent  She has been placed on 10 days of antibiotic finishing her course on 11/28/2022, currently no signs of active  infection, no fever or leukocytosis, continue feeding through PEG tube, continue aspiration precautions.  Baseline quality of life is poor.  Seen by palliative care continue present line of treatment.  Respiratory failure has improved, supplemental nasal cannula oxygen if required.  Initially was on nonrebreather in ER now much improved.  She is back on room air.  History  of seizures (HCC)   -  On Depakote , keppra, and Dilantin. -With significantly supratherapeutic Dilantin level, neurology were consulted to evaluate need for adjustment of her regimen, plan to repeat level on 5/5, EEG is pending.  Abdominal distention  -X-ray significant for gaseous distention, but she has been tolerating her tube feed.    History of cerebral infarction with baseline right-sided hemiparesis, expressive aphasia and dysphagia.  PEG tube dependent.  Supportive care.  Poor quality of life at baseline.  Continue aspirin and statin for secondary prevention.  Also history of intracerebral hemorrhage in the past.   Essential hypertension  - BP is controlled on metoprolol  Hypothyroidism -apparently Synthroid has been stopped by her PCP, TSH within normal limit during hospital stay, will defer to PCP as an outpatient.  History of paroxysmal atrial fibrillation in July 2023 advised to score of greater than 5.  Seen by cardiology in the past no anticoagulation as high risk for intracerebral bleed.  Continue amiodarone, beta-blocker, aspirin and monitor.    History of sacral decubitus ulcers present upon admission.  Currently see nursing note for details.  Wound care to continue.    Hypokalemia.  Replaced.    Diabetes mellitus without complication (HCC) Not on insulin, PEG tube feeds, few episodes of hypoglycemia, could have been due to held tube feeds on 11/24/2022 however will check baseline TSH and cortisol levels are appropriate  Lab Results  Component Value Date   HGBA1C 5.4 11/26/2022   CBG (last 3)  Recent  Labs    11/29/22 0334 11/29/22 0806 11/29/22 1115  GLUCAP 104* 146* 117*   Lab Results  Component Value Date   TSH 2.504 11/26/2022           Condition - Extremely Guarded  Family Communication  : None at bedside  Code Status :  DNR  Consults  :  Pall Care  PUD Prophylaxis : PPI   Procedures  :            Disposition Plan  :    Status is: Inpatient   DVT Prophylaxis  :    SCDs Start: 11/16/22 2214    Lab Results  Component Value Date   PLT 132 (L) 11/29/2022    Diet :  Diet Order             Diet NPO time specified  Diet effective now                    Inpatient Medications  Scheduled Meds:  acetaminophen  640 mg Per Tube Q8H   amiodarone  200 mg Per Tube Daily   aspirin  81 mg Per Tube QHS   bisacodyl  10 mg Rectal Once   Chlorhexidine Gluconate Cloth  6 each Topical Daily   feeding supplement (PROSource TF20)  60 mL Per Tube Daily   free water  100 mL Per Tube Q6H   hydrOXYzine  10 mg Per Tube TID   leptospermum manuka honey  1 Application Topical Daily   levETIRAcetam  1,000 mg Per Tube BID   metoprolol tartrate  25 mg Per Tube BID   mouth rinse  15 mL Mouth Rinse 4 times per day   phosphorus  500 mg Oral TID   simvastatin  10 mg Per Tube QHS   sodium chloride flush  10-40 mL Intracatheter Q12H   valproic acid  500 mg Per Tube TID   valproic acid  750 mg Per Tube QHS  Continuous Infusions:  feeding supplement (OSMOLITE 1.2 CAL) 1,000 mL (11/29/22 1005)   PRN Meds:.dextrose, HYDROcodone-acetaminophen, lip balm, metoprolol tartrate, midazolam, morphine injection, mouth rinse    Objective:   Vitals:   11/29/22 0000 11/29/22 0400 11/29/22 0803 11/29/22 1116  BP: 121/74 125/78 121/71   Pulse: 60 60    Resp: 20 18    Temp: 98 F (36.7 C) 98.3 F (36.8 C) 98.7 F (37.1 C) 98.5 F (36.9 C)  TempSrc: Oral Oral Axillary Oral  SpO2: 93% 96%    Weight:        Wt Readings from Last 3 Encounters:  11/26/22 88 kg   05/16/22 82 kg  02/21/22 81.2 kg    No intake or output data in the 24 hours ending 11/29/22 1330    Physical Exam  Awake, eyes open, does not communicate, follow any commands or answer any questions Symmetrical Chest wall movement, Good air movement bilaterally, CTAB RRR,No Gallops,Rubs or new Murmurs, No Parasternal Heave +ve B.Sounds, Abd Soft, No tenderness, No rebound - guarding or rigidity. 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 (Active)  02/15/22 2200  Location: Sacrum  Location Orientation: Medial  Staging: Stage 4 - Full thickness tissue loss with exposed bone, tendon or muscle.  Wound Description (Comments):   Present on Admission: Yes  Dressing Type Foam - Lift dressing to assess site every shift 11/29/22 0755      Data Review:    Recent Labs  Lab 11/25/22 0631 11/26/22 0602 11/27/22 0417 11/28/22 0507 11/29/22 0426  WBC 7.8 10.1 10.1 8.4 8.8  HGB 12.8 11.6* 10.9* 11.2* 10.7*  HCT 37.7 35.1* 31.7* 34.3* 32.7*  PLT 151 143* 142* 140* 132*  MCV 92.6 93.6 92.7 95.0 94.2  MCH 31.4 30.9 31.9 31.0 30.8  MCHC 34.0 33.0 34.4 32.7 32.7  RDW 15.8* 15.9* 15.9* 16.4* 16.5*  LYMPHSABS  --   --  4.5* 2.7 2.6  MONOABS  --   --  0.8 0.8 1.1*  EOSABS  --   --  0.2 0.4 0.1  BASOSABS  --   --  0.0 0.0 0.0    Recent Labs  Lab 11/25/22 0500 11/25/22 0631 11/26/22 0259 11/26/22 0602 11/26/22 1043 11/27/22 0417 11/28/22 0507 11/29/22 0426  NA  --  134*  --  132*  --  136 139 138  K  --  3.8  --  3.3*  --  3.2* 3.5 3.8  CL  --  100  --  97*   --  97* 103 104  CO2  --  30  --  28  --  31 31 30   ANIONGAP  --  4*  --  7  --  8 5 4*  GLUCOSE  --  121*  --  153*  --  84 111* 131*  BUN  --  <5*  --  10  --  14 17 16   CREATININE  --  0.36*  --  0.42*  --  0.42* 0.41* 0.39*  ALBUMIN 1.8*  --   --   --   --  <  1.5*  --  1.6*  CRP  --   --   --  7.0*  --   --   --   --   PROCALCITON  --   --   --  <0.10  --   --   --   --   TSH  --   --   --   --  2.504  --   --   --   HGBA1C  --   --   --   --  5.4  --   --   --   BNP  --   --   --  209.9*  --  171.6* 133.3* 227.3*  MG  --  1.6* 2.1  --   --  1.9 1.8 2.0  CALCIUM  --  7.7*  --  7.4*  --  7.4* 7.3* 7.5*     Recent Labs  Lab 11/25/22 0631 11/26/22 0259 11/26/22 0602 11/26/22 1043 11/27/22 0417 11/28/22 0507 11/29/22 0426  CRP  --   --  7.0*  --   --   --   --   PROCALCITON  --   --  <0.10  --   --   --   --   TSH  --   --   --  2.504  --   --   --   HGBA1C  --   --   --  5.4  --   --   --   BNP  --   --  209.9*  --  171.6* 133.3* 227.3*  MG 1.6* 2.1  --   --  1.9 1.8 2.0  CALCIUM 7.7*  --  7.4*  --  7.4* 7.3* 7.5*     Radiology Reports DG Abd Portable 1V  Result Date: 11/26/2022 CLINICAL DATA:  Ileus. EXAM: PORTABLE ABDOMEN - 1 VIEW COMPARISON:  November 23, 2022. FINDINGS: Stable gaseous distention of the colon. No definite small bowel dilatation is noted. Postsurgical changes noted in lower lumbar spine. IMPRESSION: Stable gaseous distention of the colon. Electronically Signed   By: Lupita Raider M.D.   On: 11/26/2022 11:35      Signature  -   Huey Bienenstock M.D on 11/29/2022 at 1:30 PM   -  To page go to [email or web address]

## 2022-11-30 DIAGNOSIS — R569 Unspecified convulsions: Secondary | ICD-10-CM | POA: Diagnosis not present

## 2022-11-30 DIAGNOSIS — J9601 Acute respiratory failure with hypoxia: Secondary | ICD-10-CM | POA: Diagnosis not present

## 2022-11-30 LAB — CBC WITH DIFFERENTIAL/PLATELET
Abs Immature Granulocytes: 0.07 10*3/uL (ref 0.00–0.07)
Basophils Absolute: 0 10*3/uL (ref 0.0–0.1)
Basophils Relative: 0 %
Eosinophils Absolute: 0.2 10*3/uL (ref 0.0–0.5)
Eosinophils Relative: 2 %
HCT: 34.7 % — ABNORMAL LOW (ref 36.0–46.0)
Hemoglobin: 11.5 g/dL — ABNORMAL LOW (ref 12.0–15.0)
Immature Granulocytes: 1 %
Lymphocytes Relative: 41 %
Lymphs Abs: 4.6 10*3/uL — ABNORMAL HIGH (ref 0.7–4.0)
MCH: 31.5 pg (ref 26.0–34.0)
MCHC: 33.1 g/dL (ref 30.0–36.0)
MCV: 95.1 fL (ref 80.0–100.0)
Monocytes Absolute: 1.4 10*3/uL — ABNORMAL HIGH (ref 0.1–1.0)
Monocytes Relative: 13 %
Neutro Abs: 5 10*3/uL (ref 1.7–7.7)
Neutrophils Relative %: 43 %
Platelets: 109 10*3/uL — ABNORMAL LOW (ref 150–400)
RBC: 3.65 MIL/uL — ABNORMAL LOW (ref 3.87–5.11)
RDW: 16.5 % — ABNORMAL HIGH (ref 11.5–15.5)
WBC: 11.3 10*3/uL — ABNORMAL HIGH (ref 4.0–10.5)
nRBC: 0.2 % (ref 0.0–0.2)

## 2022-11-30 LAB — BASIC METABOLIC PANEL
Anion gap: 6 (ref 5–15)
BUN: 19 mg/dL (ref 8–23)
CO2: 26 mmol/L (ref 22–32)
Calcium: 7.6 mg/dL — ABNORMAL LOW (ref 8.9–10.3)
Chloride: 103 mmol/L (ref 98–111)
Creatinine, Ser: 0.39 mg/dL — ABNORMAL LOW (ref 0.44–1.00)
GFR, Estimated: >60 mL/min (ref >60)
Glucose, Bld: 105 mg/dL — ABNORMAL HIGH (ref 70–99)
Potassium: 4.3 mmol/L (ref 3.5–5.1)
Sodium: 135 mmol/L (ref 135–145)

## 2022-11-30 LAB — GLUCOSE, CAPILLARY
Glucose-Capillary: 105 mg/dL — ABNORMAL HIGH (ref 70–99)
Glucose-Capillary: 105 mg/dL — ABNORMAL HIGH (ref 70–99)
Glucose-Capillary: 111 mg/dL — ABNORMAL HIGH (ref 70–99)
Glucose-Capillary: 126 mg/dL — ABNORMAL HIGH (ref 70–99)
Glucose-Capillary: 82 mg/dL (ref 70–99)
Glucose-Capillary: 96 mg/dL (ref 70–99)

## 2022-11-30 LAB — MAGNESIUM: Magnesium: 1.9 mg/dL (ref 1.7–2.4)

## 2022-11-30 LAB — BRAIN NATRIURETIC PEPTIDE: B Natriuretic Peptide: 179.7 pg/mL — ABNORMAL HIGH (ref 0.0–100.0)

## 2022-11-30 MED ORDER — FUROSEMIDE 10 MG/ML IJ SOLN
20.0000 mg | Freq: Once | INTRAMUSCULAR | Status: AC
  Administered 2022-11-30: 20 mg via INTRAVENOUS
  Filled 2022-11-30: qty 2

## 2022-11-30 MED ORDER — ENOXAPARIN SODIUM 40 MG/0.4ML IJ SOSY
40.0000 mg | PREFILLED_SYRINGE | Freq: Every day | INTRAMUSCULAR | Status: DC
  Administered 2022-11-30 – 2022-12-03 (×4): 40 mg via SUBCUTANEOUS
  Filled 2022-11-30 (×4): qty 0.4

## 2022-11-30 NOTE — Progress Notes (Signed)
PROGRESS NOTE                                                                                                                                                                                                             Kathleen Jensen Demographics:    Kathleen Jensen, is a 72 y.o. female, DOB - 30-Apr-1951, [id number]  Outpatient Primary MD for the Kathleen Jensen is Kathleen Jensen, No Pcp Per    LOS - 14  Admit date - 11/16/2022    Chief Complaint  Kathleen Jensen presents with   Aspiration       Brief Narrative (HPI from H&P)     72 y.o. 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  She has a history of A-fib, pacemaker in place, seizures, HTN, DM, hypothyroidism, anemia. She has a PEG tube in place for approximately the last year due to poor p.o. intake.  She has had a stepwise decline over the last year following sepsis from UTI and other infectious processes.    According to the daughter Kathleen Jensen lives at nursing home, last time she was able to feed herself was close to 1 year ago, since then her quality of life has been going down, she was brought to the hospital from SNF with respiratory distress, acute hypoxic respiratory failure requiring nonrebreather and changes suggestive of aspiration pneumonia.  As well she has been seen by neurology service for evaluation and adjustment of her AEDs.   Subjective:    Nature Thibault today in bed, in no distress, unable to answer questions or follow commands.  No significant events overnight as discussed with staff   Assessment  & Plan :    Acute hypoxic respiratory failure present on admission due to aspiration pneumonia in a Kathleen Jensen with stroke, right-sided hemiparesis, expressive aphasia, dysphagia who is PEG tube dependent She has been placed on 10 days of antibiotic finishing her course on 11/28/2022, currently no signs of active  infection, no fever or leukocytosis, continue feeding through PEG tube, continue aspiration precautions.  Baseline quality of life is poor.  Seen by palliative care continue present line of treatment.  Respiratory failure has improved, supplemental nasal cannula oxygen if required.  Initially was on nonrebreather in ER now much improved.  She is back on room air.  History of  seizures (HCC)   -  On Depakote , keppra, and Dilantin. -With significantly supratherapeutic Dilantin level, neurology were consulted to evaluate need for adjustment of her regimen, management per neurology, repeat Dilantin level on 5/5 is pending, EEG is pending as well.    Abdominal distention  -X-ray significant for gaseous distention, but she has been tolerating her tube feed.  This has resolved.  History of cerebral infarction with baseline right-sided hemiparesis, expressive aphasia and dysphagia.  PEG tube dependent.  Supportive care.  Poor quality of life at baseline.  Continue aspirin and statin for secondary prevention.  Also history of intracerebral hemorrhage in the past.   Essential hypertension  - BP is controlled on metoprolol  Hypothyroidism -apparently Synthroid has been stopped by her PCP, TSH within normal limit during hospital stay, will defer to PCP as an outpatient.  History of paroxysmal atrial fibrillation in July 2023 advised to score of greater than 5.  Seen by cardiology in the past no anticoagulation as high risk for intracerebral bleed.  Continue amiodarone, beta-blocker, aspirin and monitor.    History of sacral decubitus ulcers present upon admission.  Currently see nursing note for details.  Wound care to continue.    Hypokalemia.  Replaced.    Diabetes mellitus without complication (HCC) Not on insulin, PEG tube feeds, few episodes of hypoglycemia, could have been due to held tube feeds on 11/24/2022 however will check baseline TSH and cortisol levels are appropriate  Lab Results   Component Value Date   HGBA1C 5.4 11/26/2022   CBG (last 3)  Recent Labs    11/30/22 0342 11/30/22 0739 11/30/22 1127  GLUCAP 82 126* 105*   Lab Results  Component Value Date   TSH 2.504 11/26/2022           Condition - Extremely Guarded  Family Communication  : None at bedside  Code Status :  DNR  Consults  :  Pall Care  PUD Prophylaxis : PPI   Procedures  :            Disposition Plan  :    Status is: Inpatient   DVT Prophylaxis  :    enoxaparin (LOVENOX) injection 40 mg Start: 11/30/22 1000 SCDs Start: 11/16/22 2214    Lab Results  Component Value Date   PLT 109 (L) 11/30/2022    Diet :  Diet Order             Diet NPO time specified  Diet effective now                    Inpatient Medications  Scheduled Meds:  acetaminophen  640 mg Per Tube Q8H   amiodarone  200 mg Per Tube Daily   aspirin  81 mg Per Tube QHS   bisacodyl  10 mg Rectal Once   Chlorhexidine Gluconate Cloth  6 each Topical Daily   enoxaparin (LOVENOX) injection  40 mg Subcutaneous Daily   feeding supplement (PROSource TF20)  60 mL Per Tube Daily   free water  100 mL Per Tube Q6H   hydrOXYzine  10 mg Per Tube TID   leptospermum manuka honey  1 Application Topical Daily   levETIRAcetam  1,000 mg Per Tube BID   metoprolol tartrate  25 mg Per Tube BID   mouth rinse  15 mL Mouth Rinse 4 times per day   phosphorus  500 mg Oral TID   simvastatin  10 mg Per Tube QHS   sodium chloride  flush  10-40 mL Intracatheter Q12H   valproic acid  500 mg Per Tube TID   valproic acid  750 mg Per Tube QHS   Continuous Infusions:  feeding supplement (OSMOLITE 1.2 CAL) 65 mL/hr at 11/30/22 0400   PRN Meds:.dextrose, HYDROcodone-acetaminophen, lip balm, metoprolol tartrate, midazolam, morphine injection, mouth rinse    Objective:   Vitals:   11/30/22 0400 11/30/22 0741 11/30/22 0923 11/30/22 1129  BP: 110/60 113/67 (!) 124/57 (!) 113/56  Pulse: 60  60   Resp: 20     Temp:  98 F (36.7 C) 98.4 F (36.9 C)  97.9 F (36.6 C)  TempSrc: Oral Axillary  Oral  SpO2: 96%     Weight:        Wt Readings from Last 3 Encounters:  11/26/22 88 kg  05/16/22 82 kg  02/21/22 81.2 kg     Intake/Output Summary (Last 24 hours) at 11/30/2022 1341 Last data filed at 11/30/2022 1100 Gross per 24 hour  Intake --  Output 4294 ml  Net -4294 ml      Physical Exam  Eyes open, does not communicate, follow any commands or answer any questions Symmetrical Chest wall movement, Good air movement bilaterally, CTAB RRR,No Gallops,Rubs or new Murmurs, No Parasternal Heave +ve B.Sounds, Abd Soft, No tenderness, No rebound - guarding or rigidity. 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 (Active)  02/15/22 2200  Location: Sacrum  Location Orientation: Medial  Staging: Stage 4 - Full thickness tissue loss with exposed bone, tendon or muscle.  Wound Description (Comments):   Present on Admission: Yes  Dressing Type Foam - Lift dressing to assess site every shift 11/30/22 0749      Data Review:    Recent Labs  Lab 11/26/22 0602 11/27/22 0417 11/28/22 0507 11/29/22 0426 11/30/22 0232  WBC 10.1 10.1 8.4 8.8 11.3*  HGB 11.6* 10.9* 11.2* 10.7* 11.5*  HCT 35.1* 31.7* 34.3* 32.7* 34.7*  PLT 143* 142* 140* 132* 109*  MCV 93.6 92.7 95.0 94.2 95.1  MCH 30.9 31.9 31.0 30.8 31.5  MCHC 33.0 34.4 32.7 32.7 33.1  RDW 15.9* 15.9* 16.4* 16.5* 16.5*  LYMPHSABS  --  4.5* 2.7 2.6 4.6*  MONOABS  --  0.8 0.8 1.1* 1.4*  EOSABS  --  0.2 0.4 0.1 0.2  BASOSABS  --  0.0  0.0 0.0 0.0    Recent Labs  Lab 11/25/22 0500 11/25/22 0631 11/26/22 0259 11/26/22 0602 11/26/22 1043 11/27/22 0417 11/28/22 0507 11/29/22 0426 11/30/22 0232  NA  --    < >  --  132*  --  136 139 138 135  K  --    < >  --  3.3*  --  3.2* 3.5 3.8 4.3  CL  --    < >  --  97*  --  97* 103 104 103  CO2  --    < >  --  28  --  31 31 30 26   ANIONGAP  --    < >  --  7  --  8 5 4*  6  GLUCOSE  --    < >  --  153*  --  84 111* 131* 105*  BUN  --    < >  --  10  --  14 17 16 19   CREATININE  --    < >  --  0.42*  --  0.42* 0.41* 0.39* 0.39*  ALBUMIN 1.8*  --   --   --   --  <1.5*  --  1.6*  --   CRP  --   --   --  7.0*  --   --   --   --   --   PROCALCITON  --   --   --  <0.10  --   --   --   --   --   TSH  --   --   --   --  2.504  --   --   --   --   HGBA1C  --   --   --   --  5.4  --   --   --   --   BNP  --   --   --  209.9*  --  171.6* 133.3* 227.3* 179.7*  MG  --    < > 2.1  --   --  1.9 1.8 2.0 1.9  CALCIUM  --    < >  --  7.4*  --  7.4* 7.3* 7.5* 7.6*   < > = values in this interval not displayed.     Recent Labs  Lab 11/26/22 0259 11/26/22 0602 11/26/22 1043 11/27/22 0417 11/28/22 0507 11/29/22 0426 11/30/22 0232  CRP  --  7.0*  --   --   --   --   --   PROCALCITON  --  <0.10  --   --   --   --   --   TSH  --   --  2.504  --   --   --   --   HGBA1C  --   --  5.4  --   --   --   --   BNP  --  209.9*  --  171.6* 133.3* 227.3* 179.7*  MG 2.1  --   --  1.9 1.8 2.0 1.9  CALCIUM  --  7.4*  --  7.4* 7.3* 7.5* 7.6*     Radiology Reports EEG adult  Result Date: 11/29/2022 Charlsie Quest, MD     11/29/2022  5:27 PM Kathleen Jensen Name: Ashlyn Armiger MRN: [id number] Epilepsy Attending: Charlsie Quest Referring Physician/Provider: Charlsie Quest, MD Date: 11/29/2022 Duration: 23.42 mins Kathleen Jensen history: 72 year old female with history of stroke and residual aphasia, seizures. EEG to evaluate for seizure. Level of alertness: Awake AEDs during EEG study: LEV, VPA, PGB  Technical aspects: This EEG study was done with scalp electrodes positioned according to the 10-20 International system of electrode placement. Electrical activity was reviewed with band pass filter of 1-70Hz , sensitivity of 7 uV/mm, display speed of 78mm/sec with a 60Hz  notched filter applied as appropriate. EEG data were recorded continuously and digitally stored.  Video monitoring was available and reviewed as appropriate. Description:  No clear posterior dominant rhythm was seen. EEG showed continuous generalized and maximal left [posterior quadrant 3 to 6 Hz theta-delta slowing. Abundant spike were noted in left posterior quadrant. Photic driving was not seen during photic stimulation. Hyperventilation was not performed.  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 Additionally there is cortical dysfunction arising from left posterior quadrant likely due to underlying stroke. Lastly there is moderate diffuse encephalopathy, nonspecific etiology. No seizures were seen throughout the recording.  Charlsie Quest      Signature  -   Mliss Fritz Halden Phegley M.D on 11/30/2022 at 1:41 PM   -  To page go to [email or web address]

## 2022-11-30 NOTE — Plan of Care (Signed)

## 2022-12-01 DIAGNOSIS — R569 Unspecified convulsions: Secondary | ICD-10-CM | POA: Diagnosis not present

## 2022-12-01 DIAGNOSIS — J9601 Acute respiratory failure with hypoxia: Secondary | ICD-10-CM | POA: Diagnosis not present

## 2022-12-01 LAB — GLUCOSE, CAPILLARY
Glucose-Capillary: 95 mg/dL (ref 70–99)
Glucose-Capillary: 124 mg/dL — ABNORMAL HIGH (ref 70–99)
Glucose-Capillary: 99 mg/dL (ref 70–99)

## 2022-12-01 LAB — BASIC METABOLIC PANEL
Anion gap: 8 (ref 5–15)
BUN: 22 mg/dL (ref 8–23)
CO2: 29 mmol/L (ref 22–32)
Calcium: 7.4 mg/dL — ABNORMAL LOW (ref 8.9–10.3)
Chloride: 95 mmol/L — ABNORMAL LOW (ref 98–111)
Creatinine, Ser: 0.44 mg/dL (ref 0.44–1.00)
GFR, Estimated: >60 mL/min (ref >60)
Glucose, Bld: 157 mg/dL — ABNORMAL HIGH (ref 70–99)
Potassium: 3.8 mmol/L (ref 3.5–5.1)
Sodium: 132 mmol/L — ABNORMAL LOW (ref 135–145)

## 2022-12-01 LAB — CBC WITH DIFFERENTIAL/PLATELET
Abs Immature Granulocytes: 0.04 10*3/uL (ref 0.00–0.07)
Basophils Absolute: 0 10*3/uL (ref 0.0–0.1)
Basophils Relative: 0 %
Eosinophils Absolute: 0.2 10*3/uL (ref 0.0–0.5)
Eosinophils Relative: 2 %
HCT: 31.2 % — ABNORMAL LOW (ref 36.0–46.0)
Hemoglobin: 10.3 g/dL — ABNORMAL LOW (ref 12.0–15.0)
Immature Granulocytes: 1 %
Lymphocytes Relative: 39 %
Lymphs Abs: 3.4 10*3/uL (ref 0.7–4.0)
MCH: 30.9 pg (ref 26.0–34.0)
MCHC: 33 g/dL (ref 30.0–36.0)
MCV: 93.7 fL (ref 80.0–100.0)
Monocytes Absolute: 0.8 10*3/uL (ref 0.1–1.0)
Monocytes Relative: 9 %
Neutro Abs: 4.3 10*3/uL (ref 1.7–7.7)
Neutrophils Relative %: 49 %
Platelets: 115 10*3/uL — ABNORMAL LOW (ref 150–400)
RBC: 3.33 MIL/uL — ABNORMAL LOW (ref 3.87–5.11)
RDW: 16.2 % — ABNORMAL HIGH (ref 11.5–15.5)
WBC: 8.7 10*3/uL (ref 4.0–10.5)
nRBC: 0.2 % (ref 0.0–0.2)

## 2022-12-01 LAB — PHENYTOIN LEVEL, TOTAL: Phenytoin Lvl: 13.9 ug/mL (ref 10.0–20.0)

## 2022-12-01 LAB — ALBUMIN: Albumin: 1.5 g/dL — ABNORMAL LOW (ref 3.5–5.0)

## 2022-12-01 MED ORDER — ALBUMIN HUMAN 25 % IV SOLN
25.0000 g | Freq: Four times a day (QID) | INTRAVENOUS | Status: AC
  Administered 2022-12-01 – 2022-12-02 (×4): 25 g via INTRAVENOUS
  Filled 2022-12-01 (×4): qty 100

## 2022-12-01 MED ORDER — ALBUMIN HUMAN 25 % IV SOLN: 12.5000 g | Freq: Four times a day (QID) | INTRAVENOUS | Status: DC

## 2022-12-01 NOTE — Progress Notes (Signed)
  Brief Palliative Medicine Progress Note:   PMT following peripherally for needs/decline:   Medical records reviewed including progress notes, labs, imaging. Neurology consulted for supratherapeutic Dilantin level. Patient will likely be medically stable for discharge soon.    Goals throughout hospitalization have been clear for DNR/DNI and gentle medical treatment. HCPOA previously indicated if patient survived hospitalization, she would want to rescind DNR at discharge. Outpatient PC to continue GOC discussions as patient has poor long term prognosis.   PMT will continue to follow peripherally. If there are any imminent needs please call the service directly. Family also has PMT contact information should further needs arise.   Thank you for allowing PMT to assist in the care of this patient.   Kathleen Jensen M. Katrinka [facility] Palliative Medicine Team Team Phone: [phone] NO CHARGE

## 2022-12-01 NOTE — Progress Notes (Signed)
PROGRESS NOTE                                                                                                                                                                                                             Patient Demographics:    Kathleen Jensen, is a 72 y.o. female, DOB - March 22, 1951, ZOX:[id number]  Outpatient Primary MD for the patient is Patient, No Pcp Per    LOS - 15  Admit date - 11/16/2022    Chief Complaint  Patient presents with   Aspiration       Brief Narrative (HPI from H&P)     72 y.o. 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  She has a history of A-fib, pacemaker in place, seizures, HTN, DM, hypothyroidism, anemia. She has a PEG tube in place for approximately the last year due to poor p.o. intake.  She has had a stepwise decline over the last year following sepsis from UTI and other infectious processes.    According to the daughter patient lives at nursing home, last time she was able to feed herself was close to 1 year ago, since then her quality of life has been going down, she was brought to the hospital from SNF with respiratory distress, acute hypoxic respiratory failure requiring nonrebreather and changes suggestive of aspiration pneumonia.  As well she has been seen by neurology service for evaluation and adjustment of her AEDs.   Subjective:    Kathleen Jensen no significant events overnight as discussed with staff, she is unable to provide any complaints.   Assessment  & Plan :    Acute hypoxic respiratory failure present on admission due to aspiration pneumonia in a patient with stroke, right-sided hemiparesis, expressive aphasia, dysphagia who is PEG tube dependent She has been placed on 10 days of antibiotic finishing her course on 11/28/2022, currently no signs of active infection, no fever or leukocytosis,  continue feeding through PEG tube, continue aspiration precautions.  Baseline quality of life is poor.  Seen by palliative care continue present line of treatment.  Respiratory failure has improved, supplemental nasal cannula oxygen if required.  Initially was on nonrebreather in ER now much improved.  She is back on room air.  History of seizures (HCC)   -  On  Depakote , keppra, and Dilantin. -With significantly supratherapeutic Dilantin level, neurology were consulted to evaluate need for adjustment of her regimen, management per neurology, repeat Dilantin level this morning remains significantly supratherapeutic at 34 once connected to albumin level, continue to hold Dilantin.    Abdominal distention  -X-ray significant for gaseous distention, but she has been tolerating her tube feed.  This has resolved.  History of cerebral infarction with baseline right-sided hemiparesis, expressive aphasia and dysphagia.  PEG tube dependent.  Supportive care.  Poor quality of life at baseline.  Continue aspirin and statin for secondary prevention.  Also history of intracerebral hemorrhage in the past.   Essential hypertension  - BP is controlled on metoprolol  Hypothyroidism -apparently Synthroid has been stopped by her PCP, TSH within normal limit during hospital stay, will defer to PCP as an outpatient.  History of paroxysmal atrial fibrillation in July 2023 advised to score of greater than 5.  Seen by cardiology in the past no anticoagulation as high risk for intracerebral bleed.  Continue amiodarone, beta-blocker, aspirin and monitor.    History of sacral decubitus ulcers present upon admission.  Currently see nursing note for details.  Wound care to continue.    Hypokalemia.  Replaced.    Diabetes mellitus without complication (HCC) Not on insulin, PEG tube feeds, few episodes of hypoglycemia, could have been due to held tube feeds on 11/24/2022 however will check baseline TSH and cortisol levels are  appropriate  Lab Results  Component Value Date   HGBA1C 5.4 11/26/2022   CBG (last 3)  Recent Labs    11/30/22 2351 12/01/22 0425 12/01/22 0751  GLUCAP 105* 95 124*   Lab Results  Component Value Date   TSH 2.504 11/26/2022           Condition - Extremely Guarded  Family Communication  : None at bedside  Code Status :  DNR  Consults  :  Pall Care  PUD Prophylaxis : PPI   Procedures  :            Disposition Plan  :    Status is: Inpatient   DVT Prophylaxis  :    enoxaparin (LOVENOX) injection 40 mg Start: 11/30/22 1000 SCDs Start: 11/16/22 2214    Lab Results  Component Value Date   PLT 115 (L) 12/01/2022    Diet :  Diet Order             Diet NPO time specified  Diet effective now                    Inpatient Medications  Scheduled Meds:  acetaminophen  640 mg Per Tube Q8H   amiodarone  200 mg Per Tube Daily   aspirin  81 mg Per Tube QHS   bisacodyl  10 mg Rectal Once   Chlorhexidine Gluconate Cloth  6 each Topical Daily   enoxaparin (LOVENOX) injection  40 mg Subcutaneous Daily   feeding supplement (PROSource TF20)  60 mL Per Tube Daily   free water  100 mL Per Tube Q6H   hydrOXYzine  10 mg Per Tube TID   leptospermum manuka honey  1 Application Topical Daily   levETIRAcetam  1,000 mg Per Tube BID   metoprolol tartrate  25 mg Per Tube BID   mouth rinse  15 mL Mouth Rinse 4 times per day   phosphorus  500 mg Oral TID   simvastatin  10 mg Per Tube QHS   sodium chloride  flush  10-40 mL Intracatheter Q12H   valproic acid  500 mg Per Tube TID   valproic acid  750 mg Per Tube QHS   Continuous Infusions:  albumin human     feeding supplement (OSMOLITE 1.2 CAL) 1,000 mL (12/01/22 1116)   PRN Meds:.dextrose, HYDROcodone-acetaminophen, lip balm, metoprolol tartrate, midazolam, morphine injection, mouth rinse    Objective:   Vitals:   12/01/22 0426 12/01/22 0427 12/01/22 0503 12/01/22 0800  BP: (!) 104/59  107/61 92/66   Pulse: 60  60 60  Resp: 20  19 19   Temp: 98.2 F (36.8 C)     TempSrc: Axillary     SpO2:    96%  Weight:  88 kg      Wt Readings from Last 3 Encounters:  12/01/22 88 kg  05/16/22 82 kg  02/21/22 81.2 kg     Intake/Output Summary (Last 24 hours) at 12/01/2022 1159 Last data filed at 12/01/2022 0326 Gross per 24 hour  Intake 20 ml  Output 1120 ml  Net -1100 ml      Physical Exam  Eyes open, does not communicate, follow any commands or answer any questions Symmetrical Chest wall movement, Good air movement bilaterally, CTAB RRR,No Gallops,Rubs or new Murmurs, No Parasternal Heave +ve B.Sounds, Abd Soft, No tenderness, No rebound - guarding or rigidity. 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 (Active)  02/15/22 2200  Location: Sacrum  Location Orientation: Medial  Staging: Stage 4 - Full thickness tissue loss with exposed bone, tendon or muscle.  Wound Description (Comments):   Present on Admission: Yes  Dressing Type Foam - Lift dressing to assess site every shift 11/30/22 1942      Data Review:    Recent Labs  Lab 11/27/22 0417 11/28/22 0507 11/29/22 0426 11/30/22 0232 12/01/22 0539  WBC 10.1 8.4 8.8 11.3* 8.7  HGB 10.9* 11.2* 10.7* 11.5* 10.3*  HCT 31.7* 34.3* 32.7* 34.7* 31.2*  PLT 142* 140* 132* 109* 115*  MCV 92.7 95.0 94.2 95.1 93.7  MCH 31.9 31.0 30.8 31.5 30.9  MCHC 34.4 32.7 32.7 33.1 33.0  RDW 15.9* 16.4* 16.5* 16.5* 16.2*  LYMPHSABS 4.5* 2.7 2.6 4.6* 3.4  MONOABS 0.8 0.8 1.1* 1.4* 0.8  EOSABS 0.2 0.4 0.1 0.2 0.2   BASOSABS 0.0 0.0 0.0 0.0 0.0    Recent Labs  Lab 11/25/22 0500 11/25/22 0631 11/26/22 0259 11/26/22 0602 11/26/22 1043 11/27/22 0417 11/28/22 0507 11/29/22 0426 11/30/22 0232 12/01/22 0539  NA  --    < >  --  132*  --  136 139 138 135 132*  K  --    < >  --  3.3*  --  3.2* 3.5 3.8 4.3 3.8  CL  --    < >  --  97*  --  97* 103 104 103 95*  CO2  --    < >  --  28  --  31 31 30 26 29   ANIONGAP  --    < >  --  7  --  8 5 4* 6  8  GLUCOSE  --    < >  --  153*  --  84 111* 131* 105* 157*  BUN  --    < >  --  10  --  14 17 16 19 22   CREATININE  --    < >  --  0.42*  --  0.42* 0.41* 0.39* 0.39* 0.44  ALBUMIN 1.8*  --   --   --   --  <1.5*  --  1.6*  --  1.5*  CRP  --   --   --  7.0*  --   --   --   --   --   --   PROCALCITON  --   --   --  <0.10  --   --   --   --   --   --   TSH  --   --   --   --  2.504  --   --   --   --   --   HGBA1C  --   --   --   --  5.4  --   --   --   --   --   BNP  --   --   --  209.9*  --  171.6* 133.3* 227.3* 179.7*  --   MG  --    < > 2.1  --   --  1.9 1.8 2.0 1.9  --   CALCIUM  --    < >  --  7.4*  --  7.4* 7.3* 7.5* 7.6* 7.4*   < > = values in this interval not displayed.     Recent Labs  Lab 11/26/22 0259 11/26/22 0602 11/26/22 1043 11/27/22 0417 11/28/22 0507 11/29/22 0426 11/30/22 0232 12/01/22 0539  CRP  --  7.0*  --   --   --   --   --   --   PROCALCITON  --  <0.10  --   --   --   --   --   --   TSH  --   --  2.504  --   --   --   --   --   HGBA1C  --   --  5.4  --   --   --   --   --   BNP  --  209.9*  --  171.6* 133.3* 227.3* 179.7*  --   MG 2.1  --   --  1.9 1.8 2.0 1.9  --   CALCIUM  --  7.4*  --  7.4* 7.3* 7.5* 7.6* 7.4*     Radiology Reports EEG adult  Result Date: 11/29/2022 Charlsie Quest, MD     11/29/2022  5:27 PM Patient Name: Kitzie Keebler MRN: [id number] Epilepsy Attending: Charlsie Quest Referring Physician/Provider: Charlsie Quest, MD Date: 11/29/2022 Duration: 23.42 mins Patient history: 72 year old  female with history of stroke and residual aphasia, seizures. EEG to evaluate for seizure. Level of alertness: Awake AEDs during EEG study: LEV, VPA, PGB Technical aspects: This EEG study was done with scalp electrodes positioned according to the 10-20 International system of electrode placement. Electrical activity was reviewed with band pass filter of 1-70Hz , sensitivity of 7 uV/mm, display speed of 56mm/sec with a 60Hz  notched filter applied as appropriate. EEG data were recorded continuously and digitally stored.  Video monitoring was available and reviewed as appropriate. Description:  No clear posterior dominant rhythm was seen. EEG  showed continuous generalized and maximal left [posterior quadrant 3 to 6 Hz theta-delta slowing. Abundant spike were noted in left posterior quadrant. Photic driving was not seen during photic stimulation. Hyperventilation was not performed.  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 Additionally there is cortical dysfunction arising from left posterior quadrant likely due to underlying stroke. Lastly there is moderate diffuse encephalopathy, nonspecific etiology. No seizures were seen throughout the recording.  Charlsie Quest      Signature  -   Mliss Fritz [place] Wheat M.D on 12/01/2022 at 11:59 AM   -  To page go to [email or web address]

## 2022-12-01 NOTE — Plan of Care (Signed)

## 2022-12-01 NOTE — Plan of Care (Signed)
Signed out by Dr. Melynda Ripple to follow Dilantin levels. Free Dilantin level from prior pending. Today's Dilantin level from the lab 13.9 Albumin concentration 1.5 Corrected phenytoin 34.1 Will continue to hold Dilantin Will recommend checking level on Tuesday. Please contact Dr. Melynda Ripple for further instructions on dosing of phenytoin.   -- Kathleen Dikes, MD Neurologist [facility] Pager: [phone]

## 2022-12-02 DIAGNOSIS — J9601 Acute respiratory failure with hypoxia: Secondary | ICD-10-CM | POA: Diagnosis not present

## 2022-12-02 DIAGNOSIS — R569 Unspecified convulsions: Secondary | ICD-10-CM | POA: Diagnosis not present

## 2022-12-02 LAB — GLUCOSE, CAPILLARY
Glucose-Capillary: 86 mg/dL (ref 70–99)
Glucose-Capillary: 117 mg/dL — ABNORMAL HIGH (ref 70–99)
Glucose-Capillary: 72 mg/dL (ref 70–99)
Glucose-Capillary: 115 mg/dL — ABNORMAL HIGH (ref 70–99)

## 2022-12-02 LAB — PHENYTOIN LEVEL, FREE AND TOTAL
Phenytoin, Free: 4.6 ug/mL — ABNORMAL HIGH (ref 1.0–2.0)
Phenytoin, Total: 15.5 ug/mL (ref 10.0–20.0)

## 2022-12-02 LAB — FREE VALPROIC ACID (DEPAKOTE): Valproic Acid, Free: 30.7 ug/mL — ABNORMAL HIGH (ref 6.0–22.0)

## 2022-12-02 MED ORDER — PROSOURCE TF20 ENFIT COMPATIBL EN LIQD
60.0000 mL | Freq: Two times a day (BID) | ENTERAL | Status: DC
  Administered 2022-12-02 – 2022-12-03 (×2): 60 mL
  Filled 2022-12-02 (×2): qty 60

## 2022-12-02 MED ORDER — MAGNESIUM SULFATE 2 GM/50ML IV SOLN
2.0000 g | Freq: Once | INTRAVENOUS | Status: AC
  Administered 2022-12-02: 2 g via INTRAVENOUS
  Filled 2022-12-02: qty 50

## 2022-12-02 MED ORDER — JUVEN PO PACK
1.0000 | PACK | Freq: Two times a day (BID) | ORAL | Status: DC
  Administered 2022-12-02 – 2022-12-03 (×2): 1
  Filled 2022-12-02 (×2): qty 1

## 2022-12-02 NOTE — Plan of Care (Signed)

## 2022-12-02 NOTE — Progress Notes (Signed)
Nutrition Follow-up  DOCUMENTATION CODES:   Not applicable  INTERVENTION:  Tube feed regimen via J-tube: Osmolite 1.2 at 65 ml/hr (1560 ml/day) Increase ProSource TF20 60 ml - BID Free water flushes of 100 ml q 6 hours Tube feeds at goal provides 2032 kcal, 126.5 gm protein, and 1679 mL total free water daily.  Start 1 packet Juven BID per tube, each packet provides 95 calories, 2.5 grams of protein, and 9.8 grams of carbohydrate; also contains L-arginine and L-glutamine, vitamin C, vitamin E, vitamin B-12, zinc, calcium, and calcium Beta-hydroxy-Beta-methylbutyrate to support wound healing   NUTRITION DIAGNOSIS:   Inadequate oral intake related to inability to eat as evidenced by NPO status. - Ongoing, being addressed via TF   GOAL:   Patient will meet greater than or equal to 90% of their needs - Being addressed via TF  MONITOR:   Labs, I & O's, Weight trends, TF tolerance, Skin  REASON FOR ASSESSMENT:   NPO/Clear Liquid Diet    ASSESSMENT:   72 year old female who presented to the ED on 4/20 from [facility] with foul-smelling urine, gurgling respiration, hypoxia. 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 Pt admitted with acute respiratory failure, SBO versus ileus.  4/20 - admitted 4/27 - TF started 4/29 - TF held for concern for aspiration -restarted in afternoon; transferred to 5W  Pt laying in bed, TF running at goal. RN at bedside. Pt tolerating TF regimen. RN shared that pt daughter had mentioned that pt was on a 1.5 formula prior.   Still pending medically stability prior to discharge back to SNF.   Medications reviewed and include: Dulcolax, Atarax, Phosphorus  Labs reviewed.  24 hr CBGs 86-124  UOP: 900 mL x 24  hours  Nutrition Focused Physical Exam  Flowsheet Row Most Recent Value  Orbital Region No depletion  Upper Arm Region No depletion  Thoracic and Lumbar Region No depletion  Buccal Region No depletion  Temple Region Mild depletion  Clavicle Bone Region Severe depletion  Clavicle and Acromion Bone Region Severe depletion  Scapular Bone Region Severe depletion  Dorsal Hand Severe depletion  Patellar Region Severe depletion  Anterior Thigh Region Severe depletion  Posterior Calf Region Severe depletion  Edema (RD Assessment) Mild  Hair Reviewed  Eyes Unable to assess  Mouth Unable to assess  Skin Reviewed  Nails Reviewed   Diet Order:   Diet Order             Diet NPO time specified  Diet effective now                  EDUCATION NEEDS:   Not appropriate for education at this time  Skin:  Skin Assessment: Skin Integrity Issues: Skin Integrity Issues:: Unstageable Unstageable: sacrum (per WOC note)  Last BM:  5/5 - 890 mL  Height:  Ht Readings from Last 1 Encounters:  05/16/22 5\' 5"  (1.651 m)   Weight:  Wt Readings from Last 1 Encounters:  12/01/22 88 kg   Ideal Body Weight:  56.8 kg  BMI:  Body mass index is 32.28 kg/m.  Estimated Nutritional Needs:  Kcal:  1900-2100 Protein:  100-120 grams Fluid:  >1.9 L   Kirby Crigler RD, LDN Clinical Dietitian See [facility] for contact information.

## 2022-12-02 NOTE — TOC Progression Note (Signed)
Transition of Care [facility]) - Progression Note    Patient Details  Name: Kathleen Jensen MRN: [id number] Date of Birth: 12-25-1950  [facility] CM/SW Contact  Mearl Latin, LCSW Phone Number: 12/02/2022, 8:39 AM  Clinical Narrative:    CSW continuing to follow for medical stability to return to SNF.   Expected Discharge Plan: Long Term Nursing Home Barriers to Discharge: Continued Medical Work up  Expected Discharge Plan and Services In-house Referral: Clinical Social Work, Hospice / Palliative Care   Post Acute Care Choice: Skilled Nursing Facility Living arrangements for the past 2 months: Skilled Nursing Facility                                       Social Determinants of Health (SDOH) Interventions SDOH Screenings   Food Insecurity: No Food Insecurity (05/16/2022)  Housing: Low Risk  (05/17/2022)  Transportation Needs: No Transportation Needs (05/17/2022)  Utilities: Not At Risk (05/17/2022)  Depression (PHQ2-9): Low Risk  (05/22/2019)  Tobacco Use: Medium Risk (11/16/2022)    Readmission Risk Interventions    02/18/2022    2:23 PM  Readmission Risk Prevention Plan  Transportation Screening Complete  PCP or Specialist Appt within 5-7 Days Complete  Home Care Screening Complete  Medication Review (RN CM) Complete

## 2022-12-02 NOTE — Progress Notes (Signed)
Subjective: No acute events overnight.  Continues to be minimally interactive.  ROS: Unable to obtain due to poor mental status  Examination  Vital signs in last 24 hours: Temp:  [97.7 F (36.5 C)-98.2 F (36.8 C)] 98.2 F (36.8 C) (05/06 0344) Pulse Rate:  [60] 60 (05/06 0800) Resp:  [16-21] 20 (05/06 0800) BP: (115-122)/(56-75) 119/62 (05/06 0800) SpO2:  [95 %-97 %] 97 % (05/06 0800) FiO2 (%):  [21 %] 21 % (05/06 0344)  General: lying in bed, NAD Neuro: Awake, alert, does look at examiner after verbal stimulation but does not track examiner, does not follow commands, does not answer any orientation questions, does not mimic, PERRLA, no nystagmus, extraocular movements appear intact, moans loudly to any noxious stimulation and does appear to withdraw in all 4 extremities Basic Metabolic Panel: Recent Labs  Lab 11/26/22 0259 11/26/22 0602 11/27/22 0417 11/28/22 0507 11/29/22 0426 11/30/22 0232 12/01/22 0539  NA  --    < > 136 139 138 135 132*  K  --    < > 3.2* 3.5 3.8 4.3 3.8  CL  --    < > 97* 103 104 103 95*  CO2  --    < > 31 31 30 26 29   GLUCOSE  --    < > 84 111* 131* 105* 157*  BUN  --    < > 14 17 16 19 22   CREATININE  --    < > 0.42* 0.41* 0.39* 0.39* 0.44  CALCIUM  --    < > 7.4* 7.3* 7.5* 7.6* 7.4*  MG 2.1  --  1.9 1.8 2.0 1.9  --   PHOS 3.8  --  4.5  --   --   --   --    < > = values in this interval not displayed.    CBC: Recent Labs  Lab 11/27/22 0417 11/28/22 0507 11/29/22 0426 11/30/22 0232 12/01/22 0539  WBC 10.1 8.4 8.8 11.3* 8.7  NEUTROABS 4.6 4.5 4.8 5.0 4.3  HGB 10.9* 11.2* 10.7* 11.5* 10.3*  HCT 31.7* 34.3* 32.7* 34.7* 31.2*  MCV 92.7 95.0 94.2 95.1 93.7  PLT 142* 140* 132* 109* 115*     Coagulation Studies: No results for input(s): "LABPROT", "INR" in the last 72 hours.  Imaging No new brain imaging overnight  ASSESSMENT AND PLAN: 72 year old female with history of stroke and residual aphasia, seizures who was admitted for  respiratory failure, see treatment for pneumonia.  During admission phenytoin level was noted to be supratherapeutic.  Neurology was consulted for adjustment in antiseizure medications.   Epilepsy Stroke with residual aphasia -Patient is on multiple antiseizure medications specifically Dilantin and Depakote which have interactions. -From outpatient neurology notes, plan was to gradually taper phenytoin   Recommendations: -Free phenytoin level is pending.  Total phenytoin level is still supratherapeutic at 34.  Appears the patient is very slowly clearing phenytoin.  Therefore would recommend holding off phenytoin for another week.  Ideally would recommend checking phenytoin levels in a week and if therapeutic, can resume at 100 mg twice daily. -Continue rest of the AEDs: Keppra 1000 mg twice daily, Depakote 500 mg 3 times daily and 750 mg nightly -As we are reducing Dilantin, this does increase risk of breakthrough seizures.  Therefore would recommend intranasal Valtoco 20 mg (10 mg in each nostril) for clinical seizures -Continue seizure precautions -As needed IV Versed for clinical seizures -Management of rest of comorbidities per primary team -Follow-up with neurologist at [facility]  in 1 to 2 weeks.  -Plan communicated with Dr. Randol Kern via secure chat   Thank you for allowing [place] to participate in the care of this patient. If you have any further questions, please contact  me or neurohospitalist.    I have spent a total of 36 minutes with the patient reviewing hospital notes,  test results, labs and examining the patient as well as establishing an assessment and plan.  > 50% of time was spent in direct patient care.    Lindie Spruce Epilepsy [facility]      Lindie Spruce Epilepsy [facility] For questions after 5pm please refer to AMION to reach the Neurologist on call

## 2022-12-02 NOTE — Progress Notes (Signed)
PROGRESS NOTE                                                                                                                                                                                                             Patient Demographics:    Kathleen Jensen, is a 72 y.o. female, DOB - [birth year], QMV:[id number]  Outpatient Primary MD for the patient is Patient, No Pcp Per    LOS - 16  Admit date - 11/16/2022    Chief Complaint  Patient presents with   Aspiration       Brief Narrative (HPI from H&P)     72 y.o. 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  She has a history of A-fib, pacemaker in place, seizures, HTN, DM, hypothyroidism, anemia. She has a PEG tube in place for approximately the last year due to poor p.o. intake.  She has had a stepwise decline over the last year following sepsis from UTI and other infectious processes.    According to the daughter patient lives at nursing home, last time she was able to feed herself was close to 1 year ago, since then her quality of life has been going down, she was brought to the hospital from SNF with respiratory distress, acute hypoxic respiratory failure requiring nonrebreather and changes suggestive of aspiration pneumonia.  As well she has been seen by neurology service for evaluation and adjustment of her AEDs.   Subjective:    Kathleen Jensen no significant events overnight as discussed with staff, she is unable to provide any complaints.   Assessment  & Plan :    Acute hypoxic respiratory failure present on admission due to aspiration pneumonia in a patient with stroke, right-sided hemiparesis, expressive aphasia, dysphagia who is PEG tube dependent She has been placed on 10 days of antibiotic finishing her course on 11/28/2022, currently no signs of active infection, no fever or leukocytosis,  continue feeding through PEG tube, continue aspiration precautions.  Baseline quality of life is poor.  Seen by palliative care continue present line of treatment.  Respiratory failure has improved, supplemental nasal cannula oxygen if required.  Initially was on nonrebreather in ER now much improved.  She is back on room air.  History of seizures (HCC)   -  On  Depakote , keppra, and Dilantin. -With significantly supratherapeutic Dilantin level, neurology were consulted to evaluate need for adjustment of her regimen, discussed with Dr. Melynda Ripple, recommendation on holding off phenytoin for another week, and then to resume at 100 mg twice daily next Monday after checking her Dilantin level at the facility, also recommendation to discharge on intranasal Valtoco 20 mg (10 mg in each nostril) if she has any clinical seizure.  Abdominal distention  -X-ray significant for gaseous distention, but she has been tolerating her tube feed.  This has resolved.  History of cerebral infarction with baseline right-sided hemiparesis, expressive aphasia and dysphagia.  PEG tube dependent.  Supportive care.  Poor quality of life at baseline.  Continue aspirin and statin for secondary prevention.  Also history of intracerebral hemorrhage in the past.   Essential hypertension  - BP is controlled on metoprolol  Hypothyroidism -apparently Synthroid has been stopped by her PCP, TSH within normal limit during hospital stay, will defer to PCP as an outpatient.  History of paroxysmal atrial fibrillation in July 2023 advised to score of greater than 5.  Seen by cardiology in the past no anticoagulation as high risk for intracerebral bleed.  Continue amiodarone, beta-blocker, aspirin and monitor.    History of sacral decubitus ulcers present upon admission.  Currently see nursing note for details.  Wound care to continue.    Hypokalemia.  Replaced.    Diabetes mellitus without complication (HCC) Not on insulin, PEG tube feeds,  few episodes of hypoglycemia, could have been due to held tube feeds on 11/24/2022 however will check baseline TSH and cortisol levels are appropriate  Lab Results  Component Value Date   HGBA1C 5.4 11/26/2022   CBG (last 3)  Recent Labs    12/01/22 0751 12/01/22 1243 12/02/22 0953  GLUCAP 124* 99 86   Lab Results  Component Value Date   TSH 2.504 11/26/2022           Condition - Extremely Guarded  Family Communication  : None at bedside,D/W daughter by phone  Code Status :  DNR, daughter likes it be changed back to full code upon DC to facility.  Consults  :  Pall Care, neurology  PUD Prophylaxis : PPI   Procedures  :            Disposition Plan  :    Status is: Inpatient   DVT Prophylaxis  :    enoxaparin (LOVENOX) injection 40 mg Start: 11/30/22 1000 SCDs Start: 11/16/22 2214    Lab Results  Component Value Date   PLT 115 (L) 12/01/2022    Diet :  Diet Order             Diet NPO time specified  Diet effective now                    Inpatient Medications  Scheduled Meds:  acetaminophen  640 mg Per Tube Q8H   amiodarone  200 mg Per Tube Daily   aspirin  81 mg Per Tube QHS   bisacodyl  10 mg Rectal Once   Chlorhexidine Gluconate Cloth  6 each Topical Daily   enoxaparin (LOVENOX) injection  40 mg Subcutaneous Daily   feeding supplement (PROSource TF20)  60 mL Per Tube Daily   free water  100 mL Per Tube Q6H   hydrOXYzine  10 mg Per Tube TID   leptospermum manuka honey  1 Application Topical Daily   levETIRAcetam  1,000 mg Per Tube BID  metoprolol tartrate  25 mg Per Tube BID   mouth rinse  15 mL Mouth Rinse 4 times per day   phosphorus  500 mg Oral TID   simvastatin  10 mg Per Tube QHS   sodium chloride flush  10-40 mL Intracatheter Q12H   valproic acid  500 mg Per Tube TID   valproic acid  750 mg Per Tube QHS   Continuous Infusions:  albumin human 25 g (12/02/22 0336)   feeding supplement (OSMOLITE 1.2 CAL) 1,000 mL (12/01/22  1116)   magnesium sulfate bolus IVPB 2 g (12/02/22 1051)   PRN Meds:.dextrose, HYDROcodone-acetaminophen, lip balm, metoprolol tartrate, midazolam, morphine injection, mouth rinse    Objective:   Vitals:   12/02/22 0344 12/02/22 0600 12/02/22 0700 12/02/22 0800  BP: (!) 122/56   119/62  Pulse:  60 60 60  Resp:  (!) 21 20 20   Temp: 98.2 F (36.8 C)     TempSrc: Oral     SpO2:  95% 97% 97%  Weight:        Wt Readings from Last 3 Encounters:  12/01/22 88 kg  05/16/22 82 kg  02/21/22 81.2 kg     Intake/Output Summary (Last 24 hours) at 12/02/2022 1141 Last data filed at 12/02/2022 0500 Gross per 24 hour  Intake 780 ml  Output 1790 ml  Net -1010 ml      Physical Exam  Eyes open, does not communicate, follow any commands or answer any questions Symmetrical Chest wall movement, Good air movement bilaterally, CTAB RRR,No Gallops,Rubs or new Murmurs, No Parasternal Heave +ve B.Sounds, Abd Soft, No tenderness, No rebound - guarding or rigidity. 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 (Active)  02/15/22 2200  Location: Sacrum  Location Orientation: Medial  Staging: Stage 4 - Full thickness tissue loss with exposed bone, tendon or muscle.  Wound Description (Comments):   Present on Admission: Yes  Dressing Type Foam - Lift dressing to assess site every shift 12/02/22 0800      Data Review:    Recent Labs  Lab 11/27/22 0417 11/28/22 0507 11/29/22 0426 11/30/22 0232 12/01/22 0539  WBC 10.1 8.4 8.8 11.3* 8.7  HGB 10.9* 11.2* 10.7* 11.5* 10.3*   HCT 31.7* 34.3* 32.7* 34.7* 31.2*  PLT 142* 140* 132* 109* 115*  MCV 92.7 95.0 94.2 95.1 93.7  MCH 31.9 31.0 30.8 31.5 30.9  MCHC 34.4 32.7 32.7 33.1 33.0  RDW 15.9* 16.4* 16.5* 16.5* 16.2*  LYMPHSABS 4.5* 2.7 2.6 4.6* 3.4  MONOABS 0.8 0.8 1.1* 1.4* 0.8  EOSABS 0.2 0.4 0.1 0.2 0.2  BASOSABS 0.0 0.0 0.0 0.0 0.0    Recent Labs  Lab 11/26/22 0259 11/26/22 0602 11/26/22 0602 11/26/22 1043 11/27/22 0417 11/28/22 0507 11/29/22 0426 11/30/22 0232 12/01/22 0539  NA  --  132*   < >  --  136 139 138 135 132*  K  --  3.3*   < >  --  3.2* 3.5 3.8 4.3 3.8  CL  --  97*   < >  --  97* 103 104  103 95*  CO2  --  28   < >  --  31 31 30 26 29   ANIONGAP  --  7   < >  --  8 5 4* 6 8  GLUCOSE  --  153*   < >  --  84 111* 131* 105* 157*  BUN  --  10   < >  --  14 17 16 19 22   CREATININE  --  0.42*   < >  --  0.42* 0.41* 0.39* 0.39* 0.44  ALBUMIN  --   --   --   --  <1.5*  --  1.6*  --  1.5*  CRP  --  7.0*  --   --   --   --   --   --   --   PROCALCITON  --  <0.10  --   --   --   --   --   --   --   TSH  --   --   --  2.504  --   --   --   --   --   HGBA1C  --   --   --  5.4  --   --   --   --   --   BNP  --  209.9*  --   --  171.6* 133.3* 227.3* 179.7*  --   MG 2.1  --   --   --  1.9 1.8 2.0 1.9  --   CALCIUM  --  7.4*   < >  --  7.4* 7.3* 7.5* 7.6* 7.4*   < > = values in this interval not displayed.     Recent Labs  Lab 11/26/22 0259 11/26/22 0602 11/26/22 0602 11/26/22 1043 11/27/22 0417 11/28/22 0507 11/29/22 0426 11/30/22 0232 12/01/22 0539  CRP  --  7.0*  --   --   --   --   --   --   --   PROCALCITON  --  <0.10  --   --   --   --   --   --   --   TSH  --   --   --  2.504  --   --   --   --   --   HGBA1C  --   --   --  5.4  --   --   --   --   --   BNP  --  209.9*  --   --  171.6* 133.3* 227.3* 179.7*  --   MG 2.1  --   --   --  1.9 1.8 2.0 1.9  --   CALCIUM  --  7.4*   < >  --  7.4* 7.3* 7.5* 7.6* 7.4*   < > = values in this interval not displayed.     Radiology  Reports EEG adult  Result Date: 11/29/2022 Charlsie Quest, MD     11/29/2022  5:27 PM Patient Name: Bertina Ogle MRN: [id number] Epilepsy Attending: Charlsie Quest Referring Physician/Provider: Charlsie Quest, MD Date: 11/29/2022 Duration: 23.42 mins Patient history: 72 year old female with history of stroke and residual aphasia, seizures. EEG to evaluate for seizure. Level of alertness: Awake AEDs during EEG study: LEV, VPA, PGB Technical aspects: This EEG study was done with scalp electrodes positioned according to the 10-20 International system of electrode placement. Electrical activity was reviewed with band pass filter of 1-70Hz , sensitivity of 7  uV/mm, display speed of 104mm/sec with a 60Hz  notched filter applied as appropriate. EEG data were recorded continuously and digitally stored.  Video monitoring was available and reviewed as appropriate. Description:  No clear posterior dominant rhythm was seen. EEG showed continuous generalized and maximal left [posterior quadrant 3 to 6 Hz theta-delta slowing. Abundant spike were noted in left posterior quadrant. Photic driving was not seen during photic stimulation. Hyperventilation was not performed.  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 Additionally there is cortical dysfunction arising from left posterior quadrant likely due to underlying stroke. Lastly there is moderate diffuse encephalopathy, nonspecific etiology. No seizures were seen throughout the recording.  Charlsie Quest      Signature  -   Mliss Fritz Zanaya Baize M.D on 12/02/2022 at 11:41 AM   -  To page go to [email or web address]

## 2022-12-03 ENCOUNTER — Telehealth (HOSPITAL_COMMUNITY): Payer: Self-pay | Admitting: Pharmacy Technician

## 2022-12-03 ENCOUNTER — Other Ambulatory Visit (HOSPITAL_COMMUNITY): Payer: Self-pay

## 2022-12-03 DIAGNOSIS — J9601 Acute respiratory failure with hypoxia: Secondary | ICD-10-CM | POA: Diagnosis not present

## 2022-12-03 DIAGNOSIS — R569 Unspecified convulsions: Secondary | ICD-10-CM | POA: Diagnosis not present

## 2022-12-03 DIAGNOSIS — T17908A Unspecified foreign body in respiratory tract, part unspecified causing other injury, initial encounter: Secondary | ICD-10-CM | POA: Diagnosis not present

## 2022-12-03 LAB — GLUCOSE, CAPILLARY
Glucose-Capillary: 83 mg/dL (ref 70–99)
Glucose-Capillary: 101 mg/dL — ABNORMAL HIGH (ref 70–99)
Glucose-Capillary: 107 mg/dL — ABNORMAL HIGH (ref 70–99)

## 2022-12-03 MED ORDER — VALTOCO 10 MG DOSE 10 MG/0.1ML NA LIQD: 20.0000 mg | NASAL | 0 refills | Status: DC | PRN

## 2022-12-03 MED ORDER — JUVEN PO PACK: 1.0000 | PACK | Freq: Two times a day (BID) | ORAL | 0 refills | Status: DC

## 2022-12-03 MED ORDER — PHENYTOIN 125 MG/5ML PO SUSP: 100.0000 mg | Freq: Two times a day (BID) | ORAL | 12 refills | Status: DC

## 2022-12-03 MED ORDER — AMIODARONE HCL 200 MG PO TABS: 200.0000 mg | ORAL_TABLET | Freq: Every day | ORAL | Status: DC

## 2022-12-03 MED ORDER — ACETAMINOPHEN 160 MG/5ML PO SOLN: 640.0000 mg | Freq: Three times a day (TID) | ORAL | 0 refills | Status: DC

## 2022-12-03 MED ORDER — HYDROCODONE-ACETAMINOPHEN 5-325 MG PO TABS: 1.0000 | ORAL_TABLET | Freq: Every day | ORAL | 0 refills | Status: DC | PRN

## 2022-12-03 MED ORDER — METOPROLOL TARTRATE 25 MG PO TABS: 25.0000 mg | ORAL_TABLET | Freq: Two times a day (BID) | ORAL | Status: DC

## 2022-12-03 NOTE — Care Management Important Message (Signed)
Important Message  Patient Details  Name: Kathleen Jensen MRN: [id number] Date of Birth: 1950/12/28   Medicare Important Message Given:  Yes     Kathleen Jensen 12/03/2022, 1:14 PM

## 2022-12-03 NOTE — Telephone Encounter (Signed)
Pharmacy Patient Advocate Encounter  Insurance verification completed.    The patient is insured through [employer] Part D   The patient is currently admitted and ran test claims for the following: Valtoco.  Copays and coinsurance results were relayed to Inpatient clinical team.

## 2022-12-03 NOTE — Progress Notes (Signed)
Per MD order, patient's PICC line removed at 0923. Patient to remain in bed for 30 minutes post removal; dressing to stay clean, dry, and intact for 24 hours; & signs of infection/notify MD for redness, swelling, pain, and/or discharge at site, generalized fever.

## 2022-12-03 NOTE — TOC Transition Note (Signed)
Transition of Care [facility]) - CM/SW Discharge Note   Patient Details  Name: Kathleen Jensen MRN: [id number] Date of Birth: Feb 02, 1951  [facility] CM/SW Contact:  Mearl Latin, LCSW Phone Number: 12/03/2022, 12:00 PM   Clinical Narrative:    Patient will DC to: [facility] Anticipated DC date: 12/03/22 Family notified: Daughter, Editor, commissioning by: [name]   Per MD patient ready for DC to [place]. RN to call report prior to discharge [phone] room 303B). RN, patient, patient's family, and facility notified of DC. Discharge Summary and FL2 sent to facility. DC packet on chart including signed script. MD confirmed Full Code with daughter. Ambulance transport requested for patient.   CSW will sign off for now as social work intervention is no longer needed. Please consult [place] again if new needs arise.     Final next level of care: Long Term Nursing Home Barriers to Discharge: Barriers Resolved   Patient Goals and CMS Choice CMS Medicare.gov Compare Post Acute Care list provided to:: Patient Represenative (must comment) Choice offered to / list presented to : Adult Children  Discharge Placement     Existing PASRR number confirmed : 12/03/22          Patient [facility] bed at: [facility] Patient to be transferred to facility by: [facility] Name of family member notified: Daughter Patient and family notified of of transfer: 12/03/22  Discharge Plan and Services Additional resources added to the After Visit Summary for   In-house Referral: Clinical Social Work, Hospice / Palliative Care   Post Acute Care Choice: Skilled Nursing Facility                               Social Determinants of Health (SDOH) Interventions SDOH Screenings   Food Insecurity: No Food Insecurity (05/16/2022)  Housing: Low Risk  (05/17/2022)  Transportation Needs: No Transportation Needs (05/17/2022)  Utilities: Not At Risk (05/17/2022)  Depression (PHQ2-9): Low Risk   (05/22/2019)  Tobacco Use: Medium Risk (11/16/2022)     Readmission Risk Interventions    02/18/2022    2:23 PM  Readmission Risk Prevention Plan  Transportation Screening Complete  PCP or Specialist Appt within 5-7 Days Complete  Home Care Screening Complete  Medication Review (RN CM) Complete

## 2022-12-03 NOTE — TOC Benefit Eligibility Note (Signed)
Patient Product/process development scientist completed.    The patient is currently admitted and upon discharge could be taking Valtoco 20 mg dodr.  The current 30 day co-pay is $0.00.   The patient is insured through [employer] Part D   This test claim was processed through [facility]- copay amounts may vary at other pharmacies due to pharmacy/plan contracts, or as the patient moves through the different stages of their insurance plan.  Roland Earl, CPHT Pharmacy Patient Advocate Specialist [facility] Patient Advocate Team Direct Number: [phone]  Fax: [phone]

## 2022-12-03 NOTE — Discharge Summary (Signed)
Physician Discharge Summary  Kathleen Jensen MVH:[id number] DOB: [birth year] DOA: 11/16/2022  PCP: Patient, No Pcp Per  Admit date: 11/16/2022 Discharge date: 12/03/2022  Admitted From: ( SNF) Disposition:  ( SNF)  Recommendations for Outpatient Follow-up:  Please resume Dilantin at a lower dose of 100 mg oral twice daily in 1 week after Obtaining Dilantin level(please correct it  to her albumin level as her albumin is significantly low) Monitor TSH closely as an outpatient   Discharge Condition: (Stable) CODE STATUS: (FULL at time of discharge  she was seen by medicine, was DNR during hospital stay, but daughter wants to rescinded at time of discharge where she is back to full code at her facility. Diet recommendation: strictly  n.p.o., all meds and feeding via PEG tube.  Brief/Interim Summary:  72 y.o. 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  She has a history of A-fib, pacemaker in place, seizures, HTN, DM, hypothyroidism, anemia. She has a PEG tube in place for approximately the last year due to poor p.o. intake.  She has had a stepwise decline over the last year following sepsis from UTI and other infectious processes.   According to the daughter patient lives at nursing home, last time she was able to feed herself was close to 1 year ago, since then her quality of life has been going down, she was brought to the hospital from SNF with respiratory distress, acute hypoxic respiratory failure requiring nonrebreather and changes suggestive of aspiration pneumonia.  Was treated with 10 days total of antibiotics, she is back on room air, as well she has been seen by neurology service for evaluation and adjustment of her AEDs.  Been extremely supratherapeutic Dilantin level once corrected to albumin.   Acute hypoxic respiratory failure present on admission due to aspiration pneumonia in a patient with stroke,  right-sided hemiparesis, expressive aphasia, dysphagia who is PEG tube dependent She has been placed on 10 days of antibiotic finishing her course on 11/28/2022, currently no signs of active infection, no fever or leukocytosis, continue feeding through PEG tube, continue aspiration precautions.  Baseline quality of life is poor.  Seen by palliative care continue present line of treatment.  Respiratory failure has improved, supplemental nasal cannula oxygen if required.  Initially was on nonrebreather in ER now much improved.  She is back on room air.   History of seizures (HCC)   -  On Depakote , keppra, and Dilantin. -With significantly supratherapeutic Dilantin level, neurology were consulted to evaluate need for adjustment of her regimen, [place] level remains significantly elevated once corrected to hypoalbuminemia, and remains significantly elevated despite being held  discussed with Dr. Melynda Ripple, recommendation on holding off phenytoin for another week, and then to resume at 100 mg twice daily next Monday after checking her Dilantin level at the facility, also recommendation to discharge on intranasal Valtoco 20 mg (10 mg in each nostril) (it is 0$  co-pay as checked by our pharmacy )if she has any clinical seizure.   Abdominal distention  -X-ray significant for gaseous distention, but she has been tolerating her tube feed.  This has resolved.   History of cerebral infarction with baseline right-sided hemiparesis, expressive aphasia and dysphagia.  PEG tube dependent.  Supportive care.  Poor quality of life at baseline.  Continue aspirin and statin for secondary prevention.  Also history of intracerebral hemorrhage in the past.   Essential hypertension  - BP is controlled on  metoprolol   Hypothyroidism -apparently Synthroid has been stopped by her PCP, TSH within normal limit during hospital stay, will defer to PCP as an outpatient.   History of paroxysmal atrial fibrillation in July 2023 advised to  score of greater than 5.  Seen by cardiology in the past no anticoagulation as high risk for intracerebral bleed.  Continue amiodarone, beta-blocker, aspirin and monitor.     History of sacral decubitus ulcers present upon admission.  Currently see nursing note for details.  Wound care to continue.     Hypokalemia.  Replaced.     Diabetes mellitus without complication (HCC) Not on insulin, PEG tube feeds, few episodes of hypoglycemia, could have been due to held tube feeds on 11/24/2022 however will check baseline TSH and cortisol levels are appropriate    Discharge Diagnoses:  Principal Problem:   Acute respiratory failure (HCC) Active Problems:   Seizures (HCC)   Hypothyroidism   OBSTRUCTIVE SLEEP APNEA   Essential hypertension   STRESS INCONTINENCE   Diabetes mellitus without complication (HCC)   Expressive aphasia   Hyperlipidemia associated with type 2 diabetes mellitus (HCC)   History of cerebral infarction   Right hemiplegia (HCC)   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HCC)   Abdominal distention    Discharge Instructions  Discharge Instructions     Diet - low sodium heart healthy   Complete by: As directed    Discharge wound care:   Complete by: As directed    Wound care to Unstageable sacral Pressure injury:  Cleanse with NS, pat dry. Cover with thin layer of Medihoney, top with dry gauze and secure with silicone foam. Change daily and PRN soiling.  11/25/22 1044   Increase activity slowly   Complete by: As directed       Allergies as of 12/03/2022       Reactions   Tape Rash   Demerol [meperidine Hcl] Nausea And Vomiting   Sulfonamide Derivatives Itching   Bacitracin-polymyxin B Dermatitis   Oxycontin [oxycodone] Itching        Medication List     STOP taking these medications    levothyroxine 75 MCG tablet Commonly known as: SYNTHROID   pregabalin 75 MG capsule Commonly known as: LYRICA       TAKE these medications     acetaminophen 160 MG/5ML solution Commonly known as: TYLENOL Place 20 mLs (640 mg total) into feeding tube every 8 (eight) hours. What changed: how much to take   Acidophilus Lactobacillus Caps Give 1 capsule by tube daily.   amiodarone 200 MG tablet Commonly known as: PACERONE Place 1 tablet (200 mg total) into feeding tube daily.   ascorbic acid 500 MG tablet Commonly known as: VITAMIN C Place 1 tablet (500 mg total) into feeding tube 2 (two) times daily. What changed: when to take this   aspirin EC 81 MG tablet 81 mg at bedtime. Via tube   Biofreeze 4 % Gel Generic drug: Menthol (Topical Analgesic) Apply 2 g topically 2 (two) times daily. Upper left arm   Cholecalciferol 25 MCG (1000 UT) capsule Place 1,000 Units into feeding tube at bedtime.   famotidine 40 MG/5ML suspension Commonly known as: PEPCID Place 40 mg into feeding tube daily.   feeding supplement (JEVITY 1.2 CAL) Liqd Place 1,000 mLs into feeding tube continuous. At 1ml/hr What changed: Another medication with the same name was added. Make sure you understand how and when to take each.   feeding supplement (PROSource TF) liquid Place 45  mLs into feeding tube 3 (three) times daily. What changed: Another medication with the same name was added. Make sure you understand how and when to take each.   nutrition supplement (JUVEN) Pack Place 1 packet into feeding tube 2 (two) times daily between meals. What changed: You were already taking a medication with the same name, and this prescription was added. Make sure you understand how and when to take each.   folic acid 800 MCG tablet Commonly known as: FOLVITE Place 800 mcg into feeding tube daily.   free water Soln Place 100 mLs into feeding tube every 4 (four) hours.   HYDROcodone-acetaminophen 5-325 MG tablet Commonly known as: NORCO/VICODIN Place 1 tablet into feeding tube daily as needed for moderate pain. What changed: how to take this    hydrOXYzine 10 MG tablet Commonly known as: ATARAX Place 10 mg into feeding tube in the morning, at noon, and at bedtime.   levETIRAcetam 100 MG/ML solution Commonly known as: KEPPRA Place 1,000 mg into feeding tube 2 (two) times daily.   metoprolol tartrate 25 MG tablet Commonly known as: LOPRESSOR Place 1 tablet (25 mg total) into feeding tube 2 (two) times daily. What changed: how much to take   phenytoin 125 MG/5ML suspension Commonly known as: DILANTIN Place 4 mLs (100 mg total) into feeding tube 2 (two) times daily. PLEASE RESUME IN 1 WEEK ONLY AFTER CHECKING DILANTIN LEVEL Start taking on: Dec 10, 2022 What changed:  how much to take additional instructions These instructions start on Dec 10, 2022. If you are unsure what to do until then, ask your doctor or other care provider.   polyethylene glycol 17 g packet Commonly known as: MIRALAX / GLYCOLAX Place 34 g into feeding tube daily as needed for mild constipation.   senna-docusate 8.6-50 MG tablet Commonly known as: Senokot-S Place 2 tablets into feeding tube 2 (two) times daily.   Simethicone 80 MG Tabs 1 tablet (80 mg total) by PEG Tube route in the morning and at bedtime. What changed:  how to take this additional instructions   simvastatin 10 MG tablet Commonly known as: ZOCOR Place 1 tablet (10 mg total) into feeding tube at bedtime.   valproic acid 250 MG/5ML solution Commonly known as: DEPAKENE Take 500-750 mg by mouth 4 (four) times daily. Give 500mg  three times daily and 750mg  at bedtime   Valtoco 10 MG Dose 10 MG/0.1ML Liqd Generic drug: diazePAM Place 20 mg into the nose as needed for up to 3 days (intranasal Valtoco 20 mg (10 mg in each nostril) if she has any clinical seizure).   Zinc 50 MG Tabs Give 50 mg by tube daily.               Discharge Care Instructions  (From admission, onward)           Start     Ordered   12/03/22 0000  Discharge wound care:       Comments: Wound  care to Unstageable sacral Pressure injury:  Cleanse with NS, pat dry. Cover with thin layer of Medihoney, top with dry gauze and secure with silicone foam. Change daily and PRN soiling.  11/25/22 1044   12/03/22 0904            Allergies  Allergen Reactions   Tape Rash   Demerol [Meperidine Hcl] Nausea And Vomiting   Sulfonamide Derivatives Itching   Bacitracin-Polymyxin B Dermatitis   Oxycontin [Oxycodone] Itching    Consultations: Neurology Palliative medicine  Procedures/Studies: EEG adult  Result Date: 12-16-22 Charlsie Quest, MD     2022/12/16  5:27 PM Patient Name: Kathleen Jensen MRN: [id number] Epilepsy Attending: Charlsie Quest Referring Physician/Provider: Charlsie Quest, MD Date: 12/16/22 Duration: 23.42 mins Patient history: 72 year old female with history of stroke and residual aphasia, seizures. EEG to evaluate for seizure. Level of alertness: Awake AEDs during EEG study: LEV, VPA, PGB Technical aspects: This EEG study was done with scalp electrodes positioned according to the 10-20 International system of electrode placement. Electrical activity was reviewed with band pass filter of 1-70Hz , sensitivity of 7 uV/mm, display speed of 82mm/sec with a 60Hz  notched filter applied as appropriate. EEG data were recorded continuously and digitally stored.  Video monitoring was available and reviewed as appropriate. Description:  No clear posterior dominant rhythm was seen. EEG showed continuous generalized and maximal left [posterior quadrant 3 to 6 Hz theta-delta slowing. Abundant spike were noted in left posterior quadrant. Photic driving was not seen during photic stimulation. Hyperventilation was not performed.  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 Additionally there is  cortical dysfunction arising from left posterior quadrant likely due to underlying stroke. Lastly there is moderate diffuse encephalopathy, nonspecific etiology. No seizures were seen throughout the recording.  Charlsie Quest   DG Abd Portable 1V  Result Date: 11/26/2022 CLINICAL DATA:  Ileus. EXAM: PORTABLE ABDOMEN - 1 VIEW COMPARISON:  November 23, 2022. FINDINGS: Stable gaseous distention of the colon. No definite small bowel dilatation is noted. Postsurgical changes noted in lower lumbar spine. IMPRESSION: Stable gaseous distention of the colon. Electronically Signed   By: Lupita Raider M.D.   On: 11/26/2022 11:35   DG CHEST PORT 1 VIEW  Result Date: 11/25/2022 CLINICAL DATA:  Pneumonia EXAM: PORTABLE CHEST 1 VIEW COMPARISON:  X-ray 11/23/2022 and older FINDINGS: Underinflation. Enlarged cardiopericardial silhouette with calcified aorta. Trace vascular congestion versus bronchovascular crowding. No pneumothorax or effusion. Right-sided PICC in place with the tip of the upper right atrium and left-sided dual lead pacemaker. IMPRESSION: Poor inflation with elevated right hemidiaphragm and either vascular congestion or bronchovascular crowding. Recommend follow up. Right-sided PICC with the tip of the upper right atrium Electronically Signed   By: Karen Kays M.D.   On: 11/25/2022 10:59   DG CHEST PORT 1 VIEW  Result Date: 11/23/2022 CLINICAL DATA:  Status post PICC line placement. EXAM: PORTABLE CHEST 1 VIEW COMPARISON:  November 21, 2022 FINDINGS: Mildly enlarged cardiac silhouette. Calcific atherosclerotic disease and tortuosity of the aorta. Clear lungs. No evidence of pneumothorax. Right PICC line terminates in the right atrium. Stable dual lead cardiac pacemaker. IMPRESSION: 1. Right PICC line terminates in the right atrium. 2. Mildly enlarged cardiac silhouette. Electronically Signed   By: Ted Mcalpine M.D.   On: 11/23/2022 13:09   [place] EKG SITE RITE  Result Date: 11/23/2022 If Site Rite  image not attached, placement could not be confirmed due to current cardiac rhythm.  DG Abd Portable 1V  Result Date: 11/23/2022 CLINICAL DATA:  Ileus. EXAM: PORTABLE ABDOMEN - 1 VIEW COMPARISON:  11/21/2022 and CT abdomen 11/19/2022 FINDINGS: Diffuse bowel gas throughout the abdomen and pelvis has minimally changed. The gas appears to be predominantly in the colon. There appears to be a small amount of gas in the rectum which is similar to the previous examination. Bowel gas distension  has minimally changed. Jejunal feeding tube is again noted. Postsurgical changes in lumbar spine. Chronic metallic densities in the right lower abdomen. Postoperative changes in the upper abdomen. Limited evaluation for free air on these supine images. Uterine calcifications in the pelvis. IMPRESSION: 1. Diffuse bowel gas distension throughout the abdomen and pelvis which has minimally changed. Bowel gas is predominantly in the colon. There is a small amount of gas in the rectum. Based on the recent CT, these findings could represent at least a partial colonic obstruction. 2. Postoperative changes with feeding tube. Electronically Signed   By: Richarda Overlie M.D.   On: 11/23/2022 08:32   DG Chest Port 1 View  Result Date: 11/21/2022 CLINICAL DATA:  Ileus, pneumonia EXAM: PORTABLE CHEST 1 VIEW COMPARISON:  Portable exam 0604 hours compared to 11/16/2022 FINDINGS: LEFT subclavian sequential pacemaker leads project over RIGHT atrium and RIGHT ventricle. Mild enlargement of cardiac silhouette. Mediastinal contours and pulmonary vascularity normal. Atherosclerotic calcification aorta. Minimal bibasilar atelectasis. Lungs otherwise clear without infiltrate, pleural effusion, or pneumothorax. IMPRESSION: Minimal bibasilar atelectasis. Aortic Atherosclerosis (ICD10-I70.0). Electronically Signed   By: Ulyses Southward M.D.   On: 11/21/2022 08:51   DG Abd Portable 1V  Result Date: 11/21/2022 CLINICAL DATA:  Ileus EXAM: PORTABLE ABDOMEN - 1  VIEW COMPARISON:  Portable exam 0607 hours compared to 11/18/2022 FINDINGS: Tip of tube projects over mid abdomen. Persistent gaseous distention of large and small bowel loops consistent with ileus. No bowel wall thickening or definite point of obstruction. Radiopaque density again identified in the RIGHT lower quadrant, uncertain etiology. Marked osseous demineralization. IMPRESSION: Persistent diffuse gaseous distention of large and small bowel loops consistent with ileus. Electronically Signed   By: Ulyses Southward M.D.   On: 11/21/2022 08:49   CT ABDOMEN PELVIS WO CONTRAST  Result Date: 11/19/2022 CLINICAL DATA:  Suspected bowel obstruction EXAM: CT ABDOMEN AND PELVIS WITHOUT CONTRAST TECHNIQUE: Multidetector CT imaging of the abdomen and pelvis was performed following the standard protocol without IV contrast. Unenhanced CT was performed per clinician order. Lack of IV contrast limits sensitivity and specificity, especially for evaluation of abdominal/pelvic solid viscera. RADIATION DOSE REDUCTION: This exam was performed according to the departmental dose-optimization program which includes automated exposure control, adjustment of the mA and/or kV according to patient size and/or use of iterative reconstruction technique. COMPARISON:  11/18/2022, 02/17/2022 FINDINGS: Lower chest: Trace bilateral pleural effusions, left greater than right. No acute airspace disease. Dual lead cardiac pacer is noted. Hepatobiliary: Unremarkable unenhanced appearance of the liver. Prior cholecystectomy. Pancreas: Unremarkable unenhanced appearance. Spleen: Unremarkable unenhanced appearance. Adrenals/Urinary Tract: Interval enlargement of a simple cyst seen on the prior CT, now measuring 4.4 cm with increased internal attenuation measuring up to 39 HU. This could reflect proteinaceous or hemorrhagic material within the cyst. No urinary tract calculi or obstructive uropathy. The adrenals are unremarkable. The bladder is  decompressed with an indwelling Foley catheter. Stomach/Bowel: There is circumferential segmental rectal wall thickening extending to the anal verge, with mild perirectal fat stranding. Rectal wall measures up to 8 mm in thickness reference image 78/3. Findings may reflect proctitis. There is diffuse colonic distension, with gas fluid levels throughout the colon consistent with diarrhea or recent laxative/enema use. No evidence of small-bowel obstruction. Postsurgical changes are seen from prior bariatric surgery. There is a percutaneous enteric catheter within the left upper quadrant, likely within the jejunum. Position unchanged since prior exam. Vascular/Lymphatic: Aortic atherosclerosis. No enlarged abdominal or pelvic lymph nodes. Reproductive: Stable calcified uterine fibroids.  No  adnexal masses. Other: No free fluid or free intraperitoneal gas. Small fat containing periumbilical ventral hernia unchanged. No bowel herniation. Musculoskeletal: No acute or destructive bony lesions. Postsurgical changes within the lower lumbar spine. Reconstructed images demonstrate no additional findings. IMPRESSION: 1. Segmental circumferential wall thickening involving the distal rectum, most compatible with proctitis. Close follow-up is recommended to document resolution and exclude underlying neoplasm. 2. Diffuse colonic distension, with gas fluid levels identified. This could reflect an element of colonic ileus or functional obstruction due to the inflamed rectum described above. Gas fluid levels may reflect diarrhea or recent laxative/enema use. 3. Trace bilateral pleural effusions, left greater than right. 4.  Aortic Atherosclerosis (ICD10-I70.0). 5. Slightly hyperdense left renal cyst, likely representing hemorrhagic cyst. A simple cyst was seen in this location on prior exams. If further evaluation is clinically indicated, renal ultrasound could be performed. Electronically Signed   By: Sharlet Salina M.D.   On:  11/19/2022 15:43   DG Abd Portable 1V  Result Date: 11/18/2022 CLINICAL DATA:  Abdominal distention EXAM: PORTABLE ABDOMEN - 1 VIEW COMPARISON:  Abdominal radiograph dated November 24, 2018 FINDINGS: Gastrostomy tube in place. Unchanged gaseous distention of multiple bowel loops, predominantly large bowel. Mild left basilar opacity which is likely due to atelectasis. Unchanged radiopaque density of the right lower quadrant. Partially visualized pacer leads. No acute osseous abnormality. IMPRESSION: 1. 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 2. Unchanged radiopaque density of the right lower quadrant, which may be a embolization coils or an ingested foreign body. Correlate with procedural/surgical history. Electronically Signed   By: Allegra Lai M.D.   On: 11/18/2022 20:46   DG Abd Portable 1V  Result Date: 11/16/2022 CLINICAL DATA:  Abdominal distension. EXAM: PORTABLE ABDOMEN - 1 VIEW COMPARISON:  May 16, 2022 FINDINGS: Numerous distended, air-filled loops of bowel are seen throughout the abdomen. No radio-opaque calculi or other significant radiographic abnormality are seen. Radiopaque surgical clips are seen within the right upper quadrant. Postoperative changes are noted within the lower lumbar spine. IMPRESSION: Findings consistent with a small-bowel obstruction versus ileus. Electronically Signed   By: Aram Candela M.D.   On: 11/16/2022 22:38   CT HEAD WO CONTRAST  Result Date: 11/16/2022 CLINICAL DATA:  Mental status change, unknown cause EXAM: CT HEAD WITHOUT CONTRAST TECHNIQUE: Contiguous axial images were obtained from the base of the skull through the vertex without intravenous contrast. RADIATION DOSE REDUCTION: This exam was performed according to the departmental dose-optimization program which includes automated exposure control, adjustment of the mA and/or kV according to  patient size and/or use of iterative reconstruction technique. COMPARISON:  05/15/2022 FINDINGS: Brain: No evidence of acute infarction, hemorrhage, hydrocephalus, extra-axial collection or mass lesion/mass effect. Remote left parietal lobe infarct. Patchy low-density changes within the periventricular and subcortical white matter most compatible with chronic microvascular ischemic change. Moderate diffuse cerebral volume loss. Vascular: Atherosclerotic calcifications involving the large vessels of the skull base. No unexpected hyperdense vessel. Skull: Normal. Negative for fracture or focal lesion. Sinuses/Orbits: No acute finding. Other: None. IMPRESSION: 1. No acute intracranial abnormality. 2. Chronic microvascular ischemic change and cerebral volume loss. Remote left parietal lobe infarct. Electronically Signed   By: Duanne Guess D.O.   On: 11/16/2022 15:14   DG Chest Port 1 View  Result Date: 11/16/2022 CLINICAL DATA:  Aspiration.  Hypoxia. EXAM: PORTABLE CHEST 1 VIEW COMPARISON:  05/15/2022 FINDINGS: Heart size remains within normal limits. Permanent pacemaker remains  in appropriate position. Aortic atherosclerotic calcification incidentally noted. Low lung volumes are again seen bilaterally, with mild bibasilar atelectasis. Lungs are otherwise clear. No evidence of pleural effusion. IMPRESSION: Low lung volumes and mild bibasilar atelectasis. Electronically Signed   By: Danae Orleans M.D.   On: 11/16/2022 13:39      Subjective:  No significant events overnight as discussed with staff, patient unable to provide any complaints, as nonverbal at baseline Discharge Exam: Vitals:   12/03/22 0400 12/03/22 0737  BP: 124/80 138/72  Pulse: 60   Resp: 10   Temp: 98.1 F (36.7 C) 97.9 F (36.6 C)  SpO2: 98%    Vitals:   12/02/22 1948 12/02/22 2327 12/03/22 0400 12/03/22 0737  BP: 111/61 130/73 124/80 138/72  Pulse: 60 60 60   Resp: 16 12 10    Temp: 98.2 F (36.8 C) 98 F (36.7 C) 98.1 F  (36.7 C) 97.9 F (36.6 C)  TempSrc: Axillary Oral Oral Oral  SpO2: 95% 99% 98%   Weight:        Eyes open, does not communicate, follow any commands or answer any questions Symmetrical Chest wall movement, Good air movement bilaterally, CTAB RRR,No Gallops,Rubs or new Murmurs, No Parasternal Heave +ve B.Sounds, Abd Soft, No tenderness, No rebound - guarding or rigidity. No Cyanosis, Clubbing or edema, No new Rash or bruise        The results of significant diagnostics from this hospitalization (including imaging, microbiology, ancillary and laboratory) are listed below for reference.     Microbiology: No results found for this or any previous visit (from the past 240 hour(s)).   Labs: BNP (last 3 results) Recent Labs    11/28/22 0507 11/29/22 0426 11/30/22 0232  BNP 133.3* 227.3* 179.7*   Basic Metabolic Panel: Recent Labs  Lab 11/27/22 0417 11/28/22 0507 11/29/22 0426 11/30/22 0232 12/01/22 0539  NA 136 139 138 135 132*  K 3.2* 3.5 3.8 4.3 3.8  CL 97* 103 104 103 95*  CO2 31 31 30 26 29   GLUCOSE 84 111* 131* 105* 157*  BUN 14 17 16 19 22   CREATININE 0.42* 0.41* 0.39* 0.39* 0.44  CALCIUM 7.4* 7.3* 7.5* 7.6* 7.4*  MG 1.9 1.8 2.0 1.9  --   PHOS 4.5  --   --   --   --    Liver Function Tests: Recent Labs  Lab 11/27/22 0417 11/29/22 0426 12/01/22 0539  ALBUMIN <1.5* 1.6* 1.5*   No results for input(s): "LIPASE", "AMYLASE" in the last 168 hours. No results for input(s): "AMMONIA" in the last 168 hours. CBC: Recent Labs  Lab 11/27/22 0417 11/28/22 0507 11/29/22 0426 11/30/22 0232 12/01/22 0539  WBC 10.1 8.4 8.8 11.3* 8.7  NEUTROABS 4.6 4.5 4.8 5.0 4.3  HGB 10.9* 11.2* 10.7* 11.5* 10.3*  HCT 31.7* 34.3* 32.7* 34.7* 31.2*  MCV 92.7 95.0 94.2 95.1 93.7  PLT 142* 140* 132* 109* 115*   Cardiac Enzymes: No results for input(s): "CKTOTAL", "CKMB", "CKMBINDEX", "TROPONINI" in the last 168 hours. BNP: Invalid input(s): "POCBNP" CBG: Recent Labs  Lab  12/02/22 1714 12/02/22 1954 12/02/22 2326 12/03/22 0331 12/03/22 0736  GLUCAP 117* 72 115* 83 101*   D-Dimer No results for input(s): "DDIMER" in the last 72 hours. Hgb A1c No results for input(s): "HGBA1C" in the last 72 hours. Lipid Profile No results for input(s): "CHOL", "HDL", "LDLCALC", "TRIG", "CHOLHDL", "LDLDIRECT" in the last 72 hours. Thyroid function studies No results for input(s): "TSH", "T4TOTAL", "T3FREE", "THYROIDAB" in the last 72  hours.  Invalid input(s): "FREET3" Anemia work up No results for input(s): "VITAMINB12", "FOLATE", "FERRITIN", "TIBC", "IRON", "RETICCTPCT" in the last 72 hours. Urinalysis    Component Value Date/Time   COLORURINE AMBER (A) 11/16/2022 2224   APPEARANCEUR CLOUDY (A) 11/16/2022 2224   LABSPEC 1.025 11/16/2022 2224   PHURINE 5.0 11/16/2022 2224   GLUCOSEU 150 (A) 11/16/2022 2224   HGBUR MODERATE (A) 11/16/2022 2224   BILIRUBINUR NEGATIVE 11/16/2022 2224   BILIRUBINUR 1+ 11/19/2016 1307   KETONESUR NEGATIVE 11/16/2022 2224   PROTEINUR NEGATIVE 11/16/2022 2224   UROBILINOGEN 1.0 11/19/2016 1307   NITRITE NEGATIVE 11/16/2022 2224   LEUKOCYTESUR LARGE (A) 11/16/2022 2224   Sepsis Labs Recent Labs  Lab 11/28/22 0507 11/29/22 0426 11/30/22 0232 12/01/22 0539  WBC 8.4 8.8 11.3* 8.7   Microbiology No results found for this or any previous visit (from the past 240 hour(s)).   Time coordinating discharge: Over 30 minutes  SIGNED:   Huey Bienenstock, MD  [facility] 12/03/2022, 11:05 AM Pager   If 7PM-7AM, please contact night-coverage [email or web address]

## 2023-01-26 ENCOUNTER — Emergency Department (HOSPITAL_COMMUNITY): Payer: Medicare HMO

## 2023-01-26 ENCOUNTER — Emergency Department (HOSPITAL_COMMUNITY)
Admission: EM | Admit: 2023-01-26 | Discharge: 2023-01-26 | Disposition: A | Discharge status: Home / Self Care | Payer: Medicare HMO | Attending: Emergency Medicine | Admitting: Emergency Medicine

## 2023-01-26 DIAGNOSIS — Z7982 Long term (current) use of aspirin: Secondary | ICD-10-CM | POA: Insufficient documentation

## 2023-01-26 DIAGNOSIS — N189 Chronic kidney disease, unspecified: Secondary | ICD-10-CM | POA: Insufficient documentation

## 2023-01-26 DIAGNOSIS — Y732 Prosthetic and other implants, materials and accessory gastroenterology and urology devices associated with adverse incidents: Secondary | ICD-10-CM | POA: Diagnosis not present

## 2023-01-26 DIAGNOSIS — I129 Hypertensive chronic kidney disease with stage 1 through stage 4 chronic kidney disease, or unspecified chronic kidney disease: Secondary | ICD-10-CM | POA: Insufficient documentation

## 2023-01-26 DIAGNOSIS — Z79899 Other long term (current) drug therapy: Secondary | ICD-10-CM | POA: Diagnosis not present

## 2023-01-26 DIAGNOSIS — T85598A Other mechanical complication of other gastrointestinal prosthetic devices, implants and grafts, initial encounter: Secondary | ICD-10-CM

## 2023-01-26 DIAGNOSIS — E1122 Type 2 diabetes mellitus with diabetic chronic kidney disease: Secondary | ICD-10-CM | POA: Insufficient documentation

## 2023-01-26 DIAGNOSIS — R569 Unspecified convulsions: Secondary | ICD-10-CM | POA: Diagnosis not present

## 2023-01-26 DIAGNOSIS — Z8673 Personal history of transient ischemic attack (TIA), and cerebral infarction without residual deficits: Secondary | ICD-10-CM | POA: Diagnosis not present

## 2023-01-26 MED ORDER — VALPROIC ACID 250 MG/5ML PO SOLN
750.0000 mg | Freq: Once | ORAL | Status: AC
  Administered 2023-01-26: 750 mg
  Filled 2023-01-26: qty 15

## 2023-01-26 MED ORDER — LORAZEPAM 2 MG/ML IJ SOLN
INTRAMUSCULAR | Status: AC
  Administered 2023-01-26: 2 mg via INTRAMUSCULAR
  Filled 2023-01-26: qty 1

## 2023-01-26 MED ORDER — LEVETIRACETAM 100 MG/ML PO SOLN
1000.0000 mg | Freq: Once | ORAL | Status: AC
  Administered 2023-01-26: 1000 mg
  Filled 2023-01-26: qty 10

## 2023-01-26 MED ORDER — LORAZEPAM 2 MG/ML IJ SOLN: 2.0000 mg | Freq: Once | INTRAMUSCULAR | Status: AC

## 2023-01-26 MED ORDER — METOPROLOL TARTRATE 25 MG/10 ML ORAL SUSPENSION
25.0000 mg | Freq: Once | ORAL | Status: AC
  Administered 2023-01-26: 25 mg
  Filled 2023-01-26: qty 10

## 2023-01-26 MED ORDER — PHENYTOIN 125 MG/5ML PO SUSP
75.0000 mg | Freq: Once | ORAL | Status: AC
  Administered 2023-01-26: 75 mg
  Filled 2023-01-26: qty 4

## 2023-01-26 NOTE — Discharge Instructions (Signed)
You will need to call IR [phone] to arrange for them to place a J tube as we have only placed a temporary tube.

## 2023-01-26 NOTE — ED Triage Notes (Signed)
Pt arrived via [facility] from [facility] for clogged feeding tube. Facility states they have been having problems with Gtube for about 3 days and they are unable to flush anything through it today.  BP 170/103  P 87 RR 19 O2 96%RA

## 2023-01-26 NOTE — ED Provider Notes (Signed)
[place] EMERGENCY DEPARTMENT AT [facility] Provider Note   CSN: [id number] Arrival date & time: 01/26/23  1759     History  Chief Complaint  Patient presents with   clogged feeding tube    Kathleen Jensen is a 72 y.o. female.  Pt is a 72 yo female with pmhx significant for chronic sacral wound, stroke, diabetes, seizures, CKD unspecified, GERD, hypertension, hyperlipidemia, s/p IPH with intraventricular extension. Pt is feeding tube dependent and it is clogged today.  Pt unable to give any hx due to MS.  EMS gives hx.        Home Medications Prior to Admission medications   Medication Sig Start Date End Date Taking? Authorizing Provider  acetaminophen (TYLENOL) 160 MG/5ML solution Place 20 mLs (640 mg total) into feeding tube every 8 (eight) hours. 12/03/22   Elgergawy, Leana Roe, MD  Acidophilus Lactobacillus CAPS Give 1 capsule by tube daily.    [provider]  amiodarone (PACERONE) 200 MG tablet Place 1 tablet (200 mg total) into feeding tube daily. 12/03/22   Elgergawy, Leana Roe, MD  ascorbic acid (VITAMIN C) 500 MG tablet Place 1 tablet (500 mg total) into feeding tube 2 (two) times daily. Patient taking differently: Place 500 mg into feeding tube daily. 02/22/22   Glade Lloyd, MD  aspirin EC 81 MG tablet 81 mg at bedtime. Via tube    [provider]  Cholecalciferol 25 MCG (1000 UT) capsule Place 1,000 Units into feeding tube at bedtime. 03/12/22   [provider]  diazePAM (VALTOCO 10 MG DOSE) 10 MG/0.1ML LIQD Place 20 mg into the nose as needed for up to 3 days (intranasal Valtoco 20 mg (10 mg in each nostril) if she has any clinical seizure). 12/03/22 12/06/22  Elgergawy, Leana Roe, MD  famotidine (PEPCID) 40 MG/5ML suspension Place 40 mg into feeding tube daily. 03/25/22   [provider]  folic acid (FOLVITE) 800 MCG tablet Place 800 mcg into feeding tube daily.    [provider]  HYDROcodone-acetaminophen  (NORCO/VICODIN) 5-325 MG tablet Place 1 tablet into feeding tube daily as needed for moderate pain. 12/03/22   Elgergawy, Leana Roe, MD  hydrOXYzine (ATARAX) 10 MG tablet Place 10 mg into feeding tube in the morning, at noon, and at bedtime.    [provider]  levETIRAcetam (KEPPRA) 100 MG/ML solution Place 1,000 mg into feeding tube 2 (two) times daily.    [provider]  Menthol, Topical Analgesic, (BIOFREEZE) 4 % GEL Apply 2 g topically 2 (two) times daily. Upper left arm    [provider]  metoprolol tartrate (LOPRESSOR) 25 MG tablet Place 1 tablet (25 mg total) into feeding tube 2 (two) times daily. 12/03/22   Elgergawy, Leana Roe, MD  nutrition supplement, JUVEN, (JUVEN) PACK Place 1 packet into feeding tube 2 (two) times daily between meals. 12/03/22   Elgergawy, Leana Roe, MD  Nutritional Supplements (FEEDING SUPPLEMENT, JEVITY 1.2 CAL,) LIQD Place 1,000 mLs into feeding tube continuous. At 66ml/hr 02/22/22   Glade Lloyd, MD  Nutritional Supplements (FEEDING SUPPLEMENT, PROSOURCE TF,) liquid Place 45 mLs into feeding tube 3 (three) times daily. 02/22/22   Glade Lloyd, MD  phenytoin (DILANTIN) 125 MG/5ML suspension Place 4 mLs (100 mg total) into feeding tube 2 (two) times daily. PLEASE RESUME IN 1 WEEK ONLY AFTER CHECKING DILANTIN LEVEL 12/10/22   Elgergawy, Leana Roe, MD  polyethylene glycol (MIRALAX / GLYCOLAX) 17 g packet Place 34 g into feeding tube daily as  needed for mild constipation. 02/22/22   Glade Lloyd, MD  senna-docusate (SENOKOT-S) 8.6-50 MG tablet Place 2 tablets into feeding tube 2 (two) times daily. 02/22/22   Glade Lloyd, MD  Simethicone 80 MG TABS 1 tablet (80 mg total) by PEG Tube route in the morning and at bedtime. Patient taking differently: Give 80 mg by tube in the morning and at bedtime. Simethicone 80mg  chewable tablet 02/22/22   Glade Lloyd, MD  simvastatin (ZOCOR) 10 MG tablet Place 1 tablet (10 mg total) into feeding tube at bedtime.  02/22/22   Glade Lloyd, MD  valproic acid (DEPAKENE) 250 MG/5ML solution Take 500-750 mg by mouth 4 (four) times daily. Give 500mg  three times daily and 750mg  at bedtime    [provider]  Water For Irrigation, Sterile (FREE WATER) SOLN Place 100 mLs into feeding tube every 4 (four) hours. 02/22/22   Glade Lloyd, MD  Zinc 50 MG TABS Give 50 mg by tube daily.    [provider]      Allergies    Tape, Demerol [meperidine hcl], Sulfonamide derivatives, Bacitracin-polymyxin b, and Oxycontin [oxycodone]    Review of Systems   Review of Systems  Gastrointestinal:        Feeding tube blocked  All other systems reviewed and are negative.   Physical Exam Updated Vital Signs BP (!) 205/132   Pulse (!) 103   Temp 98.2 F (36.8 C)   Resp 17   Ht 5\' 5"  (1.651 m)   Wt 88 kg   SpO2 98%   BMI 32.28 kg/m  Physical Exam Vitals and nursing note reviewed.  Constitutional:      Appearance: Normal appearance.  HENT:     Head: Normocephalic and atraumatic.     Right Ear: External ear normal.     Left Ear: External ear normal.     Nose: Nose normal.     Mouth/Throat:     Mouth: Mucous membranes are moist.     Pharynx: Oropharynx is clear.  Eyes:     Extraocular Movements: Extraocular movements intact.     Conjunctiva/sclera: Conjunctivae normal.     Pupils: Pupils are equal, round, and reactive to light.  Cardiovascular:     Rate and Rhythm: Normal rate and regular rhythm.     Pulses: Normal pulses.     Heart sounds: Normal heart sounds.  Pulmonary:     Effort: Pulmonary effort is normal.     Breath sounds: Normal breath sounds.  Abdominal:     General: Abdomen is flat. Bowel sounds are normal.     Palpations: Abdomen is soft.     Comments: Mickey tube noted 46f 2.5cm  Musculoskeletal:        General: Normal range of motion.     Cervical back: Normal range of motion and neck supple.  Skin:    General: Skin is warm.     Capillary Refill: Capillary refill  takes less than 2 seconds.  Neurological:     Mental Status: She is alert. Mental status is at baseline.     Comments: Pt will look at you, but is nonverbal.  Psychiatric:     Comments: Unable to assess     ED Results / Procedures / Treatments   Labs (all labs ordered are listed, but only abnormal results are displayed) Labs Reviewed - No data to display  EKG None  Radiology DG ABDOMEN PEG TUBE LOCATION  Result Date: 01/26/2023 CLINICAL DATA:  Check jejunostomy catheter EXAM: ABDOMEN -  1 VIEW COMPARISON:  11/26/2022 FINDINGS: Contrast was injected via indwelling jejunostomy catheter. Free flow contrast into the small bowel is noted. Scattered large and small bowel gas is seen without obstructive change. IMPRESSION: Jejunostomy catheter within the small bowel lumen. Electronically Signed   By: Alcide Clever M.D.   On: 01/26/2023 21:06    Procedures FEEDING TUBE REPLACEMENT  Date/Time: 01/26/2023 9:26 PM  Performed by: Jacalyn Lefevre, MD Authorized by: Jacalyn Lefevre, MD  Consent: The procedure was performed in an emergent situation. Verbal consent obtained. Consent given by: guardian Preparation: Patient was prepped and draped in the usual sterile fashion. Indications: tube blocked Local anesthesia used: no  Anesthesia: Local anesthesia used: no  Sedation: Patient sedated: no  Tube type: jejunostomy Patient position: supine Procedure type: replacement Tube size: 16 Fr Bulb inflation volume: 10 (ml) Bulb inflation fluid: normal saline Placement/position confirmation: contrast and x-ray Tube placement difficulty: none Patient tolerance: patient tolerated the procedure well with no immediate complications       Medications Ordered in ED Medications  levETIRAcetam (KEPPRA) 100 MG/ML solution 1,000 mg (has no administration in time range)  phenytoin (DILANTIN) 125 MG/5ML suspension 75 mg (has no administration in time range)  valproic acid (DEPAKENE) 250 MG/5ML  solution 750 mg (has no administration in time range)  metoprolol tartrate (LOPRESSOR) 25 mg/10 mL oral suspension 25 mg (has no administration in time range)  LORazepam (ATIVAN) injection 2 mg (2 mg Intramuscular Given 01/26/23 2115)    ED Course/ Medical Decision Making/ A&P                             Medical Decision Making Amount and/or Complexity of Data Reviewed Radiology: ordered.  Risk Prescription drug management.   We attempted to flush the tube, but were unsuccessful.  It is unclear why she has such a small tube.  We don't have the correct size here at [facility].  We called [facility] and they don't have the same kind either.  I was able to easily pass a 14 F foley, so I took that out and put in a 65 F.  This is in place as verified on xray. Pt will need a definitive j tube placed by IR.  Facility to arrange.  Pt has not had any of her seizure medications today and had a seizure while here.  She is given 2 mg ativan IM and seizure stopped.  She is back to her normal ms.  Pt given her evening seizure meds here per tube.  Pt is stable for d/c.  Her daughter is here at bedside and understands the plan.          Final Clinical Impression(s) / ED Diagnoses Final diagnoses:  Blockage of feeding tube  Seizure [facility])    Rx / DC Orders ED Discharge Orders     None         Jacalyn Lefevre, MD 01/26/23 2132

## 2023-01-27 ENCOUNTER — Emergency Department (HOSPITAL_COMMUNITY)
Admit: 2023-01-27 | Discharge: 2023-01-27 | Disposition: A | Discharge status: Home / Self Care | Payer: Medicare HMO | Attending: Gerontology | Admitting: Gerontology

## 2023-01-27 ENCOUNTER — Emergency Department (HOSPITAL_COMMUNITY)
Admission: EM | Admit: 2023-01-27 | Discharge: 2023-01-27 | Disposition: A | Discharge status: Home / Self Care | Payer: Medicare HMO | Attending: Emergency Medicine | Admitting: Emergency Medicine

## 2023-01-27 DIAGNOSIS — K9420 Gastrostomy complication, unspecified: Secondary | ICD-10-CM

## 2023-01-27 DIAGNOSIS — Z79899 Other long term (current) drug therapy: Secondary | ICD-10-CM | POA: Diagnosis not present

## 2023-01-27 DIAGNOSIS — Z7982 Long term (current) use of aspirin: Secondary | ICD-10-CM | POA: Insufficient documentation

## 2023-01-27 DIAGNOSIS — R4701 Aphasia: Secondary | ICD-10-CM | POA: Diagnosis not present

## 2023-01-27 DIAGNOSIS — R569 Unspecified convulsions: Secondary | ICD-10-CM | POA: Diagnosis not present

## 2023-01-27 DIAGNOSIS — Z931 Gastrostomy status: Secondary | ICD-10-CM

## 2023-01-27 HISTORY — PX: IR REPLC DUODEN/JEJUNO TUBE PERCUT W/FLUORO: IMG2334

## 2023-01-27 MED ORDER — IOHEXOL 300 MG/ML  SOLN
50.0000 mL | Freq: Once | INTRAMUSCULAR | Status: AC | PRN
  Administered 2023-01-27: 20 mL

## 2023-01-27 MED ORDER — LIDOCAINE VISCOUS HCL 2 % MT SOLN
OROMUCOSAL | Status: AC
  Filled 2023-01-27: qty 15

## 2023-01-27 NOTE — ED Notes (Signed)
This Clinical research associate called report to [facility] x2 unable to reach a nurse at this time. Will retry at later time.

## 2023-01-27 NOTE — ED Notes (Signed)
[facility] called for transport 

## 2023-01-27 NOTE — ED Triage Notes (Signed)
Patient BIB EMS coming from [facility], patient came back to [place] with a foley catheter that she didn't have before. Patient G-bue is still not working and ABD is now distented. VSS. 154/72, 68, 97%RA.

## 2023-01-27 NOTE — Procedures (Signed)
Interventional Radiology Procedure Note  Procedure: Jejunostomy tube exchange  Findings: Please refer to procedural dictation for full description.  16 Fr jejunostomy tube replaced.  Complications: None immediate  Estimated Blood Loss: < 5 mL  Recommendations: Jejunostomy tube ready for use as needed.   Marliss Coots, MD

## 2023-01-27 NOTE — ED Provider Notes (Signed)
[place] EMERGENCY DEPARTMENT AT [facility] Provider Note   CSN: [id number] Arrival date & time: 01/27/23  1327     History  Chief Complaint  Patient presents with   G-tube not working     Lenix Dajanae Muccino is a 72 y.o. female.  HPI Patient presents 1 day after being seen and evaluated at our facility, now with concern from her nursing home staff about placement of Foley catheter into G-tube site. Reported the patient was supposed to go to IR today for replacement of her G-tube but was brought to the emergency department.  History is obtained by chart review, nursing home staff, and EMS. On chart review the patient was seen yesterday due to concern for clogged PEG tube. Per report patient has had problems with the tube presents going to nursing facility, and was sent here today. Patient's medical problems clued aphasia, hemiplegia, seizures, and she cannot provide any details of her history.     Home Medications Prior to Admission medications   Medication Sig Start Date End Date Taking? Authorizing Provider  acetaminophen (TYLENOL) 160 MG/5ML solution Place 20 mLs (640 mg total) into feeding tube every 8 (eight) hours. 12/03/22   Elgergawy, Leana Roe, MD  Acidophilus Lactobacillus CAPS Give 1 capsule by tube daily.    [provider]  amiodarone (PACERONE) 200 MG tablet Place 1 tablet (200 mg total) into feeding tube daily. 12/03/22   Elgergawy, Leana Roe, MD  ascorbic acid (VITAMIN C) 500 MG tablet Place 1 tablet (500 mg total) into feeding tube 2 (two) times daily. Patient taking differently: Place 500 mg into feeding tube daily. 02/22/22   Glade Lloyd, MD  aspirin EC 81 MG tablet 81 mg at bedtime. Via tube    [provider]  Cholecalciferol 25 MCG (1000 UT) capsule Place 1,000 Units into feeding tube at bedtime. 03/12/22   [provider]  diazePAM (VALTOCO 10 MG DOSE) 10 MG/0.1ML LIQD Place 20 mg into the nose as needed for up to 3 days  (intranasal Valtoco 20 mg (10 mg in each nostril) if she has any clinical seizure). 12/03/22 12/06/22  Elgergawy, Leana Roe, MD  famotidine (PEPCID) 40 MG/5ML suspension Place 40 mg into feeding tube daily. 03/25/22   [provider]  folic acid (FOLVITE) 800 MCG tablet Place 800 mcg into feeding tube daily.    [provider]  HYDROcodone-acetaminophen (NORCO/VICODIN) 5-325 MG tablet Place 1 tablet into feeding tube daily as needed for moderate pain. 12/03/22   Elgergawy, Leana Roe, MD  hydrOXYzine (ATARAX) 10 MG tablet Place 10 mg into feeding tube in the morning, at noon, and at bedtime.    [provider]  levETIRAcetam (KEPPRA) 100 MG/ML solution Place 1,000 mg into feeding tube 2 (two) times daily.    [provider]  Menthol, Topical Analgesic, (BIOFREEZE) 4 % GEL Apply 2 g topically 2 (two) times daily. Upper left arm    [provider]  metoprolol tartrate (LOPRESSOR) 25 MG tablet Place 1 tablet (25 mg total) into feeding tube 2 (two) times daily. 12/03/22   Elgergawy, Leana Roe, MD  nutrition supplement, JUVEN, (JUVEN) PACK Place 1 packet into feeding tube 2 (two) times daily between meals. 12/03/22   Elgergawy, Leana Roe, MD  Nutritional Supplements (FEEDING SUPPLEMENT, JEVITY 1.2 CAL,) LIQD Place 1,000 mLs into feeding tube continuous. At 25ml/hr 02/22/22   Glade Lloyd, MD  Nutritional Supplements (FEEDING SUPPLEMENT, PROSOURCE TF,) liquid Place 45 mLs into feeding tube 3 (three)  times daily. 02/22/22   Glade Lloyd, MD  phenytoin (DILANTIN) 125 MG/5ML suspension Place 4 mLs (100 mg total) into feeding tube 2 (two) times daily. PLEASE RESUME IN 1 WEEK ONLY AFTER CHECKING DILANTIN LEVEL 12/10/22   Elgergawy, Leana Roe, MD  polyethylene glycol (MIRALAX / GLYCOLAX) 17 g packet Place 34 g into feeding tube daily as needed for mild constipation. 02/22/22   Glade Lloyd, MD  senna-docusate (SENOKOT-S) 8.6-50 MG tablet Place 2 tablets into feeding tube 2 (two) times  daily. 02/22/22   Glade Lloyd, MD  Simethicone 80 MG TABS 1 tablet (80 mg total) by PEG Tube route in the morning and at bedtime. Patient taking differently: Give 80 mg by tube in the morning and at bedtime. Simethicone 80mg  chewable tablet 02/22/22   Glade Lloyd, MD  simvastatin (ZOCOR) 10 MG tablet Place 1 tablet (10 mg total) into feeding tube at bedtime. 02/22/22   Glade Lloyd, MD  valproic acid (DEPAKENE) 250 MG/5ML solution Take 500-750 mg by mouth 4 (four) times daily. Give 500mg  three times daily and 750mg  at bedtime    [provider]  Water For Irrigation, Sterile (FREE WATER) SOLN Place 100 mLs into feeding tube every 4 (four) hours. 02/22/22   Glade Lloyd, MD  Zinc 50 MG TABS Give 50 mg by tube daily.    [provider]      Allergies    Tape, Demerol [meperidine hcl], Sulfonamide derivatives, Bacitracin-polymyxin b, and Oxycontin [oxycodone]    Review of Systems   Review of Systems  Unable to perform ROS: Patient nonverbal    Physical Exam Updated Vital Signs BP (!) 158/100 (BP Location: Left Arm)   Pulse 60   Temp 97.7 F (36.5 C) (Oral)   Resp 20   SpO2 100%  Physical Exam Constitutional:      Appearance: She is ill-appearing.     Comments: Unwell appearing elderly female  HENT:     Head: Atraumatic.     Mouth/Throat:     Mouth: Mucous membranes are moist.  Eyes:     Conjunctiva/sclera: Conjunctivae normal.  Cardiovascular:     Rate and Rhythm: Normal rate and regular rhythm.  Pulmonary:     Effort: Pulmonary effort is normal.  Abdominal:     Tenderness: There is guarding.  Skin:    General: Skin is warm.  Neurological:     Comments: Aphasia, minimally interactive, flaccid  Psychiatric:     Comments: Cognitively impaired     ED Results / Procedures / Treatments   Labs (all labs ordered are listed, but only abnormal results are displayed) Labs Reviewed - No data to display  EKG None  Radiology DG ABDOMEN PEG TUBE  LOCATION  Result Date: 01/26/2023 CLINICAL DATA:  Check jejunostomy catheter EXAM: ABDOMEN - 1 VIEW COMPARISON:  11/26/2022 FINDINGS: Contrast was injected via indwelling jejunostomy catheter. Free flow contrast into the small bowel is noted. Scattered large and small bowel gas is seen without obstructive change. IMPRESSION: Jejunostomy catheter within the small bowel lumen. Electronically Signed   By: Alcide Clever M.D.   On: 01/26/2023 21:06    Procedures Procedures    Medications Ordered in ED Medications - No data to display  ED Course/ Medical Decision Making/ A&P                             Medical Decision Making Elderly female with multimedical issues including aphasia, bedbound status, presents 1  day after bedside replacement of PEG tube with Foley catheter now with concern for this not functioning. Given plan for IR replacement I discussed the case with their staff, and without fever, hypotension, or nursing, reports no other changes, we facilitated exchange of Foley catheter to appropriate gastric tube via interventional radiology. This was well-tolerated.   4:25 PM Patient sleeping  Patient does have a known seizure history, and after discussing case with nursing staff, patient's evaluation was expedited, so that she could return to her nursing facility for resumption of medications per schedule. Final Clinical Impression(s) / ED Diagnoses Final diagnoses:  Complication of feeding tube [facility])     Gerhard Munch, MD 01/27/23 1625

## 2023-01-27 NOTE — Discharge Instructions (Signed)
Follow-up with your physician as needed.

## 2023-01-27 NOTE — ED Notes (Signed)
Pt transported to IR 

## 2023-02-28 ENCOUNTER — Inpatient Hospital Stay (HOSPITAL_COMMUNITY)
Admission: EM | Admit: 2023-02-28 | Discharge: 2023-03-06 | DRG: 689 | Disposition: A | Discharge status: Skilled Nursing Facility | Payer: Medicare HMO | Source: Skilled Nursing Facility | Attending: Internal Medicine | Admitting: Internal Medicine

## 2023-02-28 ENCOUNTER — Emergency Department (HOSPITAL_COMMUNITY): Payer: Medicare HMO

## 2023-02-28 DIAGNOSIS — D649 Anemia, unspecified: Secondary | ICD-10-CM | POA: Diagnosis present

## 2023-02-28 DIAGNOSIS — I69351 Hemiplegia and hemiparesis following cerebral infarction affecting right dominant side: Secondary | ICD-10-CM

## 2023-02-28 DIAGNOSIS — Z6832 Body mass index (BMI) 32.0-32.9, adult: Secondary | ICD-10-CM

## 2023-02-28 DIAGNOSIS — E669 Obesity, unspecified: Secondary | ICD-10-CM | POA: Diagnosis present

## 2023-02-28 DIAGNOSIS — Z888 Allergy status to other drugs, medicaments and biological substances status: Secondary | ICD-10-CM

## 2023-02-28 DIAGNOSIS — Z950 Presence of cardiac pacemaker: Secondary | ICD-10-CM

## 2023-02-28 DIAGNOSIS — R410 Disorientation, unspecified: Secondary | ICD-10-CM

## 2023-02-28 DIAGNOSIS — Z885 Allergy status to narcotic agent status: Secondary | ICD-10-CM

## 2023-02-28 DIAGNOSIS — R4701 Aphasia: Secondary | ICD-10-CM | POA: Diagnosis present

## 2023-02-28 DIAGNOSIS — F05 Delirium due to known physiological condition: Secondary | ICD-10-CM

## 2023-02-28 DIAGNOSIS — J45909 Unspecified asthma, uncomplicated: Secondary | ICD-10-CM | POA: Diagnosis present

## 2023-02-28 DIAGNOSIS — R1310 Dysphagia, unspecified: Secondary | ICD-10-CM | POA: Diagnosis present

## 2023-02-28 DIAGNOSIS — R627 Adult failure to thrive: Secondary | ICD-10-CM | POA: Diagnosis present

## 2023-02-28 DIAGNOSIS — Z79899 Other long term (current) drug therapy: Secondary | ICD-10-CM

## 2023-02-28 DIAGNOSIS — Z931 Gastrostomy status: Secondary | ICD-10-CM

## 2023-02-28 DIAGNOSIS — I482 Chronic atrial fibrillation, unspecified: Secondary | ICD-10-CM | POA: Insufficient documentation

## 2023-02-28 DIAGNOSIS — Z8673 Personal history of transient ischemic attack (TIA), and cerebral infarction without residual deficits: Secondary | ICD-10-CM

## 2023-02-28 DIAGNOSIS — G9341 Metabolic encephalopathy: Secondary | ICD-10-CM | POA: Diagnosis present

## 2023-02-28 DIAGNOSIS — I10 Essential (primary) hypertension: Secondary | ICD-10-CM | POA: Diagnosis present

## 2023-02-28 DIAGNOSIS — B9620 Unspecified Escherichia coli [E. coli] as the cause of diseases classified elsewhere: Secondary | ICD-10-CM | POA: Diagnosis present

## 2023-02-28 DIAGNOSIS — Z91048 Other nonmedicinal substance allergy status: Secondary | ICD-10-CM

## 2023-02-28 DIAGNOSIS — Z87891 Personal history of nicotine dependence: Secondary | ICD-10-CM

## 2023-02-28 DIAGNOSIS — E871 Hypo-osmolality and hyponatremia: Secondary | ICD-10-CM | POA: Insufficient documentation

## 2023-02-28 DIAGNOSIS — G40909 Epilepsy, unspecified, not intractable, without status epilepticus: Secondary | ICD-10-CM | POA: Diagnosis present

## 2023-02-28 DIAGNOSIS — Z7982 Long term (current) use of aspirin: Secondary | ICD-10-CM

## 2023-02-28 DIAGNOSIS — Z833 Family history of diabetes mellitus: Secondary | ICD-10-CM

## 2023-02-28 DIAGNOSIS — G8191 Hemiplegia, unspecified affecting right dominant side: Secondary | ICD-10-CM

## 2023-02-28 DIAGNOSIS — L89154 Pressure ulcer of sacral region, stage 4: Secondary | ICD-10-CM | POA: Diagnosis present

## 2023-02-28 DIAGNOSIS — Z882 Allergy status to sulfonamides status: Secondary | ICD-10-CM

## 2023-02-28 DIAGNOSIS — N3000 Acute cystitis without hematuria: Principal | ICD-10-CM

## 2023-02-28 DIAGNOSIS — L89892 Pressure ulcer of other site, stage 2: Secondary | ICD-10-CM | POA: Diagnosis present

## 2023-02-28 DIAGNOSIS — I69320 Aphasia following cerebral infarction: Secondary | ICD-10-CM

## 2023-02-28 DIAGNOSIS — Z87898 Personal history of other specified conditions: Secondary | ICD-10-CM

## 2023-02-28 DIAGNOSIS — R41841 Cognitive communication deficit: Secondary | ICD-10-CM | POA: Diagnosis present

## 2023-02-28 DIAGNOSIS — E785 Hyperlipidemia, unspecified: Secondary | ICD-10-CM | POA: Diagnosis present

## 2023-02-28 DIAGNOSIS — B9689 Other specified bacterial agents as the cause of diseases classified elsewhere: Secondary | ICD-10-CM | POA: Diagnosis present

## 2023-02-28 DIAGNOSIS — Z9884 Bariatric surgery status: Secondary | ICD-10-CM

## 2023-02-28 DIAGNOSIS — I495 Sick sinus syndrome: Secondary | ICD-10-CM | POA: Diagnosis present

## 2023-02-28 DIAGNOSIS — N390 Urinary tract infection, site not specified: Secondary | ICD-10-CM

## 2023-02-28 DIAGNOSIS — K219 Gastro-esophageal reflux disease without esophagitis: Secondary | ICD-10-CM | POA: Diagnosis present

## 2023-02-28 DIAGNOSIS — E039 Hypothyroidism, unspecified: Secondary | ICD-10-CM | POA: Diagnosis present

## 2023-02-28 LAB — CBC WITH DIFFERENTIAL/PLATELET
Abs Immature Granulocytes: 0.01 10*3/uL (ref 0.00–0.07)
Basophils Absolute: 0 10*3/uL (ref 0.0–0.1)
Basophils Relative: 1 %
Eosinophils Absolute: 0.2 10*3/uL (ref 0.0–0.5)
Eosinophils Relative: 3 %
HCT: 43.1 % (ref 36.0–46.0)
Hemoglobin: 13.3 g/dL (ref 12.0–15.0)
Immature Granulocytes: 0 %
Lymphocytes Relative: 43 %
Lymphs Abs: 3.3 10*3/uL (ref 0.7–4.0)
MCH: 29.2 pg (ref 26.0–34.0)
MCHC: 30.9 g/dL (ref 30.0–36.0)
MCV: 94.7 fL (ref 80.0–100.0)
Monocytes Absolute: 0.7 10*3/uL (ref 0.1–1.0)
Monocytes Relative: 9 %
Neutro Abs: 3.4 10*3/uL (ref 1.7–7.7)
Neutrophils Relative %: 44 %
Platelets: 163 10*3/uL (ref 150–400)
RBC: 4.55 MIL/uL (ref 3.87–5.11)
RDW: 16.6 % — ABNORMAL HIGH (ref 11.5–15.5)
WBC: 7.6 10*3/uL (ref 4.0–10.5)
nRBC: 0 % (ref 0.0–0.2)

## 2023-02-28 LAB — COMPREHENSIVE METABOLIC PANEL
ALT: 25 U/L (ref 0–44)
AST: 61 U/L — ABNORMAL HIGH (ref 15–41)
Albumin: 2.3 g/dL — ABNORMAL LOW (ref 3.5–5.0)
Alkaline Phosphatase: 171 U/L — ABNORMAL HIGH (ref 38–126)
Anion gap: 5 (ref 5–15)
BUN: 21 mg/dL (ref 8–23)
CO2: 29 mmol/L (ref 22–32)
Calcium: 8.2 mg/dL — ABNORMAL LOW (ref 8.9–10.3)
Chloride: 98 mmol/L (ref 98–111)
Creatinine, Ser: 0.52 mg/dL (ref 0.44–1.00)
GFR, Estimated: >60 mL/min (ref >60)
Glucose, Bld: 82 mg/dL (ref 70–99)
Potassium: 4.4 mmol/L (ref 3.5–5.1)
Sodium: 132 mmol/L — ABNORMAL LOW (ref 135–145)
Total Bilirubin: 0.6 mg/dL (ref 0.3–1.2)
Total Protein: 9.1 g/dL — ABNORMAL HIGH (ref 6.5–8.1)

## 2023-02-28 LAB — URINALYSIS, ROUTINE W REFLEX MICROSCOPIC
Bilirubin Urine: NEGATIVE
Glucose, UA: NEGATIVE mg/dL
Hgb urine dipstick: NEGATIVE
Ketones, ur: NEGATIVE mg/dL
Nitrite: NEGATIVE
Protein, ur: NEGATIVE mg/dL
Specific Gravity, Urine: 1.013 (ref 1.005–1.030)
WBC, UA: >50 WBC/hpf (ref 0–5)
pH: 7 (ref 5.0–8.0)

## 2023-02-28 LAB — I-STAT CG4 LACTIC ACID, ED: Lactic Acid, Venous: 1.7 mmol/L (ref 0.5–1.9)

## 2023-02-28 MED ORDER — SODIUM CHLORIDE 0.9 % IV BOLUS
1000.0000 mL | Freq: Once | INTRAVENOUS | Status: AC
  Administered 2023-02-28: 1000 mL via INTRAVENOUS

## 2023-02-28 MED ORDER — SODIUM CHLORIDE 0.9 % IV SOLN
1.0000 g | Freq: Once | INTRAVENOUS | Status: AC
  Administered 2023-02-28: 1 g via INTRAVENOUS
  Filled 2023-02-28: qty 10

## 2023-02-28 MED ORDER — MORPHINE SULFATE (PF) 4 MG/ML IV SOLN
4.0000 mg | Freq: Once | INTRAVENOUS | Status: AC
  Administered 2023-02-28: 4 mg via INTRAVENOUS
  Filled 2023-02-28: qty 1

## 2023-02-28 MED ORDER — NYSTATIN 100000 UNIT/GM EX POWD
Freq: Once | CUTANEOUS | Status: DC
  Filled 2023-02-28: qty 15

## 2023-02-28 NOTE — ED Provider Notes (Signed)
[facility] EMERGENCY DEPARTMENT AT [facility] Provider Note   CSN: [id number] Arrival date & time: 02/28/23  1736     History  No chief complaint on file.   Kathleen Jensen is a 72 y.o. female.  The history is provided by the EMS personnel and medical records. No language interpreter was used.     72 year old female significant history of diabetes, expressive aphasia, right hemiplegia, hypertension, seizures, cognitive communication deficit brought here via EMS from [facility] with concerns of UTI.  Per triage note, patient has been yelling uncontrollably and she had a confirmed UTI urinalysis that was done at her facility from yesterday.  Staff also noted that the G-tube Is ripped off but still is in use by the staff and does not appear to be displaced.  Staff also noticed that patient was sweaty earlier.  I was unable to obtain much history from patient.  Level 5 caveat's applies.  Home Medications Prior to Admission medications   Medication Sig Start Date End Date Taking? Authorizing Provider  acetaminophen (TYLENOL) 160 MG/5ML solution Place 20 mLs (640 mg total) into feeding tube every 8 (eight) hours. 12/03/22   Elgergawy, Leana Roe, MD  Acidophilus Lactobacillus CAPS Give 1 capsule by tube daily.    [provider]  amiodarone (PACERONE) 200 MG tablet Place 1 tablet (200 mg total) into feeding tube daily. 12/03/22   Elgergawy, Leana Roe, MD  ascorbic acid (VITAMIN C) 500 MG tablet Place 1 tablet (500 mg total) into feeding tube 2 (two) times daily. Patient taking differently: Place 500 mg into feeding tube daily. 02/22/22   Glade Lloyd, MD  aspirin EC 81 MG tablet 81 mg at bedtime. Via tube    [provider]  Cholecalciferol 25 MCG (1000 UT) capsule Place 1,000 Units into feeding tube at bedtime. 03/12/22   [provider]  diazePAM (VALTOCO 10 MG DOSE) 10 MG/0.1ML LIQD Place 20 mg into the nose as needed for up to 3 days (intranasal  Valtoco 20 mg (10 mg in each nostril) if she has any clinical seizure). 12/03/22 12/06/22  Elgergawy, Leana Roe, MD  famotidine (PEPCID) 40 MG/5ML suspension Place 40 mg into feeding tube daily. 03/25/22   [provider]  folic acid (FOLVITE) 800 MCG tablet Place 800 mcg into feeding tube daily.    [provider]  HYDROcodone-acetaminophen (NORCO/VICODIN) 5-325 MG tablet Place 1 tablet into feeding tube daily as needed for moderate pain. 12/03/22   Elgergawy, Leana Roe, MD  hydrOXYzine (ATARAX) 10 MG tablet Place 10 mg into feeding tube in the morning, at noon, and at bedtime.    [provider]  levETIRAcetam (KEPPRA) 100 MG/ML solution Place 1,000 mg into feeding tube 2 (two) times daily.    [provider]  Menthol, Topical Analgesic, (BIOFREEZE) 4 % GEL Apply 2 g topically 2 (two) times daily. Upper left arm    [provider]  metoprolol tartrate (LOPRESSOR) 25 MG tablet Place 1 tablet (25 mg total) into feeding tube 2 (two) times daily. 12/03/22   Elgergawy, Leana Roe, MD  nutrition supplement, JUVEN, (JUVEN) PACK Place 1 packet into feeding tube 2 (two) times daily between meals. 12/03/22   Elgergawy, Leana Roe, MD  Nutritional Supplements (FEEDING SUPPLEMENT, JEVITY 1.2 CAL,) LIQD Place 1,000 mLs into feeding tube continuous. At 36ml/hr 02/22/22   Glade Lloyd, MD  Nutritional Supplements (FEEDING SUPPLEMENT, PROSOURCE TF,) liquid Place 45 mLs into feeding tube 3 (three) times daily. 02/22/22  Glade Lloyd, MD  phenytoin (DILANTIN) 125 MG/5ML suspension Place 4 mLs (100 mg total) into feeding tube 2 (two) times daily. PLEASE RESUME IN 1 WEEK ONLY AFTER CHECKING DILANTIN LEVEL 12/10/22   Elgergawy, Leana Roe, MD  polyethylene glycol (MIRALAX / GLYCOLAX) 17 g packet Place 34 g into feeding tube daily as needed for mild constipation. 02/22/22   Glade Lloyd, MD  senna-docusate (SENOKOT-S) 8.6-50 MG tablet Place 2 tablets into feeding tube 2 (two) times daily.  02/22/22   Glade Lloyd, MD  Simethicone 80 MG TABS 1 tablet (80 mg total) by PEG Tube route in the morning and at bedtime. Patient taking differently: Give 80 mg by tube in the morning and at bedtime. Simethicone 80mg  chewable tablet 02/22/22   Glade Lloyd, MD  simvastatin (ZOCOR) 10 MG tablet Place 1 tablet (10 mg total) into feeding tube at bedtime. 02/22/22   Glade Lloyd, MD  valproic acid (DEPAKENE) 250 MG/5ML solution Take 500-750 mg by mouth 4 (four) times daily. Give 500mg  three times daily and 750mg  at bedtime    [provider]  Water For Irrigation, Sterile (FREE WATER) SOLN Place 100 mLs into feeding tube every 4 (four) hours. 02/22/22   Glade Lloyd, MD  Zinc 50 MG TABS Give 50 mg by tube daily.    [provider]      Allergies    Tape, Demerol [meperidine hcl], Sulfonamide derivatives, Bacitracin-polymyxin b, and Oxycontin [oxycodone]    Review of Systems   Review of Systems  Unable to perform ROS: Dementia    Physical Exam Updated Vital Signs BP (!) 202/93   Pulse (!) 59   Temp 98.2 F (36.8 C)   Resp 18   Ht 5\' 5"  (1.651 m)   Wt 88 kg   SpO2 96%   BMI 32.28 kg/m  Physical Exam Vitals and nursing note reviewed.  Constitutional:      General: She is not in acute distress.    Appearance: She is well-developed.     Comments: Obese female, nonverbal, laying in bed not having any active discomfort.  HENT:     Head: Atraumatic.  Eyes:     Conjunctiva/sclera: Conjunctivae normal.  Cardiovascular:     Rate and Rhythm: Normal rate and regular rhythm.     Pulses: Normal pulses.     Heart sounds: Normal heart sounds.  Pulmonary:     Effort: Pulmonary effort is normal.  Abdominal:     Palpations: Abdomen is soft.     Tenderness: There is no abdominal tenderness.  Genitourinary:    Comments: Chaperone present during exam.  Pt has stage IV sacral decubitus ulcer with dressing in place.  Musculoskeletal:     Cervical back: Neck supple.   Skin:    Findings: No rash.  Neurological:     Mental Status: She is alert.     GCS: GCS eye subscore is 4. GCS verbal subscore is 2. GCS motor subscore is 5.  Psychiatric:        Mood and Affect: Mood normal.     ED Results / Procedures / Treatments   Labs (all labs ordered are listed, but only abnormal results are displayed) Labs Reviewed  COMPREHENSIVE METABOLIC PANEL - Abnormal; Notable for the following components:      Result Value   Sodium 132 (*)    Calcium 8.2 (*)    Total Protein 9.1 (*)    Albumin 2.3 (*)    AST 61 (*)    Alkaline  Phosphatase 171 (*)    All other components within normal limits  URINALYSIS, ROUTINE W REFLEX MICROSCOPIC - Abnormal; Notable for the following components:   APPearance CLOUDY (*)    Leukocytes,Ua LARGE (*)    Bacteria, UA RARE (*)    All other components within normal limits  CBC WITH DIFFERENTIAL/PLATELET - Abnormal; Notable for the following components:   RDW 16.6 (*)    All other components within normal limits  URINE CULTURE  CBC WITH DIFFERENTIAL/PLATELET  I-STAT CG4 LACTIC ACID, ED  I-STAT CG4 LACTIC ACID, ED    EKG EKG Interpretation Date/Time:  Friday February 28 2023 20:56:17 EDT Ventricular Rate:  71 PR Interval:  194 QRS Duration:  80 QT Interval:  389 QTC Calculation: 426 R Axis:   -21  Text Interpretation: Atrial-paced complexes Low voltage, precordial leads Abnormal R-wave progression, early transition LVH with secondary repolarization abnormality Anterior Q waves, possibly due to LVH No significant change since last tracing Confirmed by Richardean Canal [id number]) on 02/28/2023 9:04:59 PM  Radiology CT Head Wo Contrast  Result Date: 02/28/2023 CLINICAL DATA:  Altered level of consciousness EXAM: CT HEAD WITHOUT CONTRAST TECHNIQUE: Contiguous axial images were obtained from the base of the skull through the vertex without intravenous contrast. RADIATION DOSE REDUCTION: This exam was performed according to the departmental  dose-optimization program which includes automated exposure control, adjustment of the mA and/or kV according to patient size and/or use of iterative reconstruction technique. COMPARISON:  11/16/2022 FINDINGS: Evaluation is extremely limited by patient motion throughout the exam. Brain: Chronic encephalomalacia within the left parietal lobe. Diffuse cerebral atrophy unchanged. No evidence of acute infarct or hemorrhage. Ex vacuo dilatation of the lateral ventricles. Midline structures are unremarkable. No acute extra-axial fluid collections. No mass effect. Vascular: No hyperdense vessel or unexpected calcification. Skull: Normal. Negative for fracture or focal lesion. Sinuses/Orbits: No acute finding. Other: None. IMPRESSION: 1. Limited study due to patient motion. 2. Diffuse cerebral atrophy and chronic ischemic changes as above. No acute intracranial process. Electronically Signed   By: Sharlet Salina M.D.   On: 02/28/2023 22:35   DG Chest Portable 1 View  Result Date: 02/28/2023 CLINICAL DATA:  illness EXAM: PORTABLE CHEST 1 VIEW COMPARISON:  CXR 11/25/22 FINDINGS: Unchanged asymmetric elevation of the right hemidiaphragm. Left-sided dual lead cardiac device in place with unchanged lead positioning. No pleural effusion. No pneumothorax. Unchanged cardiac and mediastinal contours. No focal airspace opacity. No radiographically apparent displaced rib fractures. Visualized upper abdomen is unremarkable. There are surgical clips in the right upper quadrant. Degenerative changes of the right glenohumeral joint. IMPRESSION: No focal airspace opacity Electronically Signed   By: Lorenza Cambridge M.D.   On: 02/28/2023 18:27    Procedures Procedures    Medications Ordered in ED Medications  cefTRIAXone (ROCEPHIN) 1 g in sodium chloride 0.9 % 100 mL IVPB (1 g Intravenous New Bag/Given 02/28/23 2155)  morphine (PF) 4 MG/ML injection 4 mg (has no administration in time range)    ED Course/ Medical Decision Making/  A&P                                 Medical Decision Making Amount and/or Complexity of Data Reviewed Labs: ordered. Radiology: ordered. ECG/medicine tests: ordered.  Risk Prescription drug management.   BP (!) 202/93   Pulse (!) 59   Temp 98.2 F (36.8 C)   Resp 18   Ht 5\' 5"  (1.651  m)   Wt 88 kg   SpO2 96%   BMI 32.28 kg/m   92:66 PM 72 year old female significant history of diabetes, expressive aphasia, right hemiplegia, hypertension, seizures, cognitive communication deficit brought here via EMS from [facility] with concerns of UTI.  Per triage note, patient has been yelling uncontrollably and she had a confirmed UTI urinalysis that was done at her facility from yesterday.  Staff also noted that the G-tube Is ripped off but still is in use by the staff and does not appear to be displaced.  Staff also noticed that patient was sweaty earlier.  I was unable to obtain much history from patient.  Level 5 caveat's applies.  On exam, patient is laying in bed making eye contact with does not seem to be responding verbally.  Has normocephalic atraumatic, heart with normal rate and rhythm, lungs without adventitious breath sounds abdomen is soft, patient's not moving any of her extremities.  She has Ulna boot to bilateral feet  Attempt to reach out to patient's legal guardian, [name] without any success.  -Labs ordered, independently viewed and interpreted by me.  Labs remarkable for UA showing large leukocyte, cloudy urine, and >50WBC along with budding yeast.  I have ordered rocephin and nystatin.   -The patient was maintained on a cardiac monitor.  I personally viewed and interpreted the cardiac monitored which showed an underlying rhythm of: NSR -Imaging independently viewed and interpreted by me and I agree with radiologist's interpretation.  Result remarkable for CXR without focal infiltrate.  Head CT without acute changes.  -This patient presents to the ED for concern  of AMS, this involves an extensive number of treatment options, and is a complaint that carries with it a high risk of complications and morbidity.  The differential diagnosis includes UTI, meningitis, pna, stroke, cellulitis, electrolytes derangement, seizure -Co morbidities that complicate the patient evaluation includes seizure, HTN, stroke, DM -Treatment includes rocephin, IVF, morphine -Reevaluation of the patient after these medicines showed that the patient stayed the same -PCP office notes or outside notes reviewed -Discussion with specialist attending Dr. Silverio Lay -Escalation to admission/observation considered: I have consulted [facility] Dr. Janalyn Shy who agrees to see and will admit pt  Due to worsening delirium in the setting of UTI and has been taking antibiotic, will have patient admitted for further care.        Final Clinical Impression(s) / ED Diagnoses Final diagnoses:  Lower urinary tract infectious disease  Delirium    Rx / DC Orders ED Discharge Orders     None         Fayrene Helper, PA-C 02/28/23 2339    Charlynne Pander, MD 03/01/23 [phone]

## 2023-02-28 NOTE — H&P (Incomplete)
History and Physical    Kathleen Jensen WUJ:[id number] DOB: 1951/07/11 DOA: 02/28/2023  PCP: System, Provider Not In   Patient coming from: Nursing home   Chief Complaint:  Chief Complaint  Patient presents with   Urinary Tract Infection    HPI:  Kathleen Jensen is a 72 y.o. 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  Patient has seen by her nursing care facility for development of urinary tract infection and worsening mentation as well.  Patient is from [facility] nursing home.  During my evaluation history is very limited from the patient.  Patient has expressive aphasia from previous history of stroke.  Per chart review note of ED physician and EMS triage note, patient has been yelling uncomfortably and she has been calm from UTI Nulecit that was done at her facility yesterday.  Patient tried to pull of her G-tube but it is still in use by the staff and does not appear to be displaced.   Unsuccessful attempt to reach patient daughter over phone tonight.  ED Course:  At initial presentation blood pressure found elevated 202/93, heart rate 59, respiratory rate 18 and O2 sat 96% room air. CBC unremarkable WBC 7.6, RBC 4.55, hemoglobin 13, hematocrit 43 and platelet 163. Lactic acid 1.7 within normal range. CMP showed slightly low sodium 132 which is baseline, potassium 4.4, chloride 98, bicarb 29, blood glucose 82, BUN 21, creatinine 1.51, AST 81, ALP 95, alkaline phosphatase 171 and GFR above 60. UA cloudy appearance, large leukocyte and rare bacteria present. Urine culture in process.  Chest x-ray no acute  cardiopulmonary abnormality.  Left-sided dual-lead cardiac device in place with unchanged lead position.  No pleural effusion.  No pneumothorax.  Unchanged cardiac mediastinal contour.  CT head without contrast showed diffuse cerebral atrophy and chronic ischemic change.  No acute intracranial process.  EKG showed atrial paced rhythm.  Heart rate 71.  No ST and T wave abnormality.  As patient is agitated she received morphine 4 mg once in the ED.  Concern for delirium in the setting of UTI.  Patient got ceftriaxone 1 g in the ED as well.  Hospitalist team has been contacted for admission for management of UTI and delirium.  Review of Systems:  Review of Systems  Unable to perform ROS: Patient nonverbal (Patient has expressive aphasia.)  Endo/Heme/Allergies:  Positive for environmental allergies.    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)    Hyperlipemia    Hypertension    Insomnia    Intracerebral hemorrhage, intraventricular (HCC)    Muscle weakness    Other abnormalities of gait and mobility    Seizures (HCC)    Stroke (HCC)    Unsteadiness on feet    Vitamin B deficiency     Past Surgical History:  Procedure Laterality Date  BACK SURGERY     Dec 18, 2016 Dr.Torrealba   BREAST BIOPSY Right    BREAST EXCISIONAL BIOPSY     CERVICAL SPINE SURGERY     C4   CESAREAN SECTION     GASTRIC BYPASS  03/2007   IR REPLC DUODEN/JEJUNO TUBE PERCUT W/FLUORO  01/27/2023   TONSILLECTOMY       reports that she quit smoking about 43 years ago. She started smoking about 47 years ago. She has never  used smokeless tobacco. She reports that she does not drink alcohol and does not use drugs.  Allergies  Allergen Reactions   Tape Rash   Demerol [Meperidine Hcl] Nausea And Vomiting   Sulfonamide Derivatives Itching   Bacitracin-Polymyxin B Dermatitis   Oxycontin [Oxycodone] Itching    Family History  Problem Relation Age of Onset   High blood pressure Mother    Diabetes Mother    Diabetes Father    Stroke Father    Cancer Sister        Colorectal cancer   Cancer Brother        colon    Prior to Admission medications   Medication Sig Start Date End Date Taking? Authorizing Provider  acetaminophen (TYLENOL) 160 MG/5ML solution Place 20 mLs (640 mg total) into feeding tube every 8 (eight) hours. 12/03/22   Elgergawy, Leana Roe, MD  Acidophilus Lactobacillus CAPS Give 1 capsule by tube daily.    [provider]  amiodarone (PACERONE) 200 MG tablet Place 1 tablet (200 mg total) into feeding tube daily. 12/03/22   Elgergawy, Leana Roe, MD  ascorbic acid (VITAMIN C) 500 MG tablet Place 1 tablet (500 mg total) into feeding tube 2 (two) times daily. Patient taking differently: Place 500 mg into feeding tube daily. 02/22/22   Glade Lloyd, MD  aspirin EC 81 MG tablet 81 mg at bedtime. Via tube    [provider]  Cholecalciferol 25 MCG (1000 UT) capsule Place 1,000 Units into feeding tube at bedtime. 03/12/22   [provider]  diazePAM (VALTOCO 10 MG DOSE) 10 MG/0.1ML LIQD Place 20 mg into the nose as needed for up to 3 days (intranasal Valtoco 20 mg (10 mg in each nostril) if she has any clinical seizure). 12/03/22 12/06/22  Elgergawy, Leana Roe, MD  famotidine (PEPCID) 40 MG/5ML suspension Place 40 mg into feeding tube daily. 03/25/22   [provider]  folic acid (FOLVITE) 800 MCG tablet Place 800 mcg into feeding tube daily.    [provider]  HYDROcodone-acetaminophen (NORCO/VICODIN) 5-325 MG tablet Place 1 tablet into feeding tube daily as needed  for moderate pain. 12/03/22   Elgergawy, Leana Roe, MD  hydrOXYzine (ATARAX) 10 MG tablet Place 10 mg into feeding tube in the morning, at noon, and at bedtime.    [provider]  levETIRAcetam (KEPPRA) 100 MG/ML solution Place 1,000 mg into feeding tube 2 (two) times daily.    [provider]  Menthol, Topical Analgesic, (BIOFREEZE) 4 % GEL Apply 2 g topically 2 (two) times daily. Upper left arm    [provider]  metoprolol tartrate (LOPRESSOR) 25 MG tablet Place 1 tablet (25 mg total) into feeding tube 2 (two) times daily. 12/03/22   Elgergawy, Leana Roe, MD  nutrition supplement, JUVEN, (JUVEN) PACK Place 1 packet into feeding tube 2 (two) times daily between meals. 12/03/22   Elgergawy, Leana Roe, MD  Nutritional Supplements (FEEDING SUPPLEMENT, JEVITY 1.2 CAL,) LIQD Place 1,000 mLs into feeding tube continuous. At  56ml/hr 02/22/22   Glade Lloyd, MD  Nutritional Supplements (FEEDING SUPPLEMENT, PROSOURCE TF,) liquid Place 45 mLs into feeding tube 3 (three) times daily. 02/22/22   Glade Lloyd, MD  phenytoin (DILANTIN) 125 MG/5ML suspension Place 4 mLs (100 mg total) into feeding tube 2 (two) times daily. PLEASE RESUME IN 1 WEEK ONLY AFTER CHECKING DILANTIN LEVEL 12/10/22   Elgergawy, Leana Roe, MD  polyethylene glycol (MIRALAX / GLYCOLAX) 17 g packet Place 34 g into feeding tube daily as needed for mild constipation. 02/22/22   Glade Lloyd, MD  senna-docusate (SENOKOT-S) 8.6-50 MG tablet Place 2 tablets into feeding tube 2 (two) times daily. 02/22/22   Glade Lloyd, MD  Simethicone 80 MG TABS 1 tablet (80 mg total) by PEG Tube route in the morning and at bedtime. Patient taking differently: Give 80 mg by tube in the morning and at bedtime. Simethicone 80mg  chewable tablet 02/22/22   Glade Lloyd, MD  simvastatin (ZOCOR) 10 MG tablet Place 1 tablet (10 mg total) into feeding tube at bedtime. 02/22/22   Glade Lloyd, MD  valproic acid (DEPAKENE) 250 MG/5ML solution Take  500-750 mg by mouth 4 (four) times daily. Give 500mg  three times daily and 750mg  at bedtime    [provider]  Water For Irrigation, Sterile (FREE WATER) SOLN Place 100 mLs into feeding tube every 4 (four) hours. 02/22/22   Glade Lloyd, MD  Zinc 50 MG TABS Give 50 mg by tube daily.    [provider]     Physical Exam: Vitals:   02/28/23 2315 03/01/23 0110 03/01/23 0215 03/01/23 0342  BP: (!) 181/94 (!) 183/102 (!) 171/91   Pulse: 73 83 78   Resp: 18 (!) 21 19   Temp: 98 F (36.7 C)   97.9 F (36.6 C)  TempSrc:    Oral  SpO2: 96% 95% 93%   Weight:      Height:        Physical Exam Constitutional:      Appearance: She is not ill-appearing.  HENT:     Head: Normocephalic and atraumatic.     Nose: Nose normal.     Mouth/Throat:     Mouth: Mucous membranes are moist.  Eyes:     Pupils: Pupils are equal, round, and reactive to light.  Cardiovascular:     Rate and Rhythm: Normal rate and regular rhythm.     Pulses: Normal pulses.     Heart sounds: Normal heart sounds.  Pulmonary:     Effort: Pulmonary effort is normal.     Breath sounds: Normal breath sounds.  Abdominal:     General: Bowel sounds are normal. There is no distension.     Tenderness: There is no abdominal tenderness.  Musculoskeletal:     Cervical back: Neck supple.     Right lower leg: No edema.     Left lower leg: No edema.  Skin:    General: Skin is warm.     Capillary Refill: Capillary refill takes less than 2 seconds.  Neurological:     Mental Status: She is alert.     Comments: Patient is alert to self only.  But not oriented to time and place. able to follow voice command  Psychiatric:     Comments: Currently calm and quiet in the room.        Labs on Admission: I have personally reviewed following labs and imaging studies  CBC: Recent Labs  Lab 02/28/23 2124  WBC 7.6  NEUTROABS 3.4  HGB 13.3  HCT 43.1  MCV 94.7  PLT 163   Basic Metabolic Panel: Recent Labs   Lab 02/28/23 2020  NA 132*  K 4.4  CL 98  CO2 29  GLUCOSE 82  BUN 21  CREATININE 0.52  CALCIUM 8.2*   GFR: Estimated Creatinine Clearance: 69.6 mL/min (by C-G formula based on SCr of 0.52 mg/dL). Liver Function Tests: Recent Labs  Lab 02/28/23 2020  AST 61*  ALT 25  ALKPHOS 171*  BILITOT 0.6  PROT 9.1*  ALBUMIN 2.3*   No results for input(s): "LIPASE", "AMYLASE" in the last 168 hours. No results for input(s): "AMMONIA" in the last 168 hours. Coagulation Profile: No results for input(s): "INR", "PROTIME" in the last 168 hours. Cardiac Enzymes: No results for input(s): "CKTOTAL", "CKMB", "CKMBINDEX", "TROPONINI", "TROPONINIHS" in the last 168 hours. BNP (last 3 results) Recent Labs    11/28/22 0507 11/29/22 0426 11/30/22 0232  BNP 133.3* 227.3* 179.7*   HbA1C: No results for input(s): "HGBA1C" in the last 72 hours. CBG: No results for input(s): "GLUCAP" in the last 168 hours. Lipid Profile: No results for input(s): "CHOL", "HDL", "LDLCALC", "TRIG", "CHOLHDL", "LDLDIRECT" in the last 72 hours. Thyroid Function Tests: No results for input(s): "TSH", "T4TOTAL", "FREET4", "T3FREE", "THYROIDAB" in the last 72 hours. Anemia Panel: No results for input(s): "VITAMINB12", "FOLATE", "FERRITIN", "TIBC", "IRON", "RETICCTPCT" in the last 72 hours. Urine analysis:    Component Value Date/Time   COLORURINE YELLOW 02/28/2023 1945   APPEARANCEUR CLOUDY (A) 02/28/2023 1945   LABSPEC 1.013 02/28/2023 1945   PHURINE 7.0 02/28/2023 1945   GLUCOSEU NEGATIVE 02/28/2023 1945   HGBUR NEGATIVE 02/28/2023 1945   BILIRUBINUR NEGATIVE 02/28/2023 1945   BILIRUBINUR 1+ 11/19/2016 1307   KETONESUR NEGATIVE 02/28/2023 1945   PROTEINUR NEGATIVE 02/28/2023 1945   UROBILINOGEN 1.0 11/19/2016 1307   NITRITE NEGATIVE 02/28/2023 1945   LEUKOCYTESUR LARGE (A) 02/28/2023 1945    Radiological Exams on Admission: I have personally reviewed images CT Head Wo Contrast  Result Date:  02/28/2023 CLINICAL DATA:  Altered level of consciousness EXAM: CT HEAD WITHOUT CONTRAST TECHNIQUE: Contiguous axial images were obtained from the base of the skull through the vertex without intravenous contrast. RADIATION DOSE REDUCTION: This exam was performed according to the departmental dose-optimization program which includes automated exposure control, adjustment of the mA and/or kV according to patient size and/or use of iterative reconstruction technique. COMPARISON:  11/16/2022 FINDINGS: Evaluation is extremely limited by patient motion throughout the exam. Brain: Chronic encephalomalacia within the left parietal lobe. Diffuse cerebral atrophy unchanged. No evidence of acute infarct or hemorrhage. Ex vacuo dilatation of the lateral ventricles. Midline structures are unremarkable. No acute extra-axial fluid collections. No mass effect. Vascular: No hyperdense vessel or unexpected calcification. Skull: Normal. Negative for fracture or focal lesion. Sinuses/Orbits: No acute finding. Other: None. IMPRESSION: 1. Limited study due to patient motion. 2. Diffuse cerebral atrophy and chronic ischemic changes as above. No acute intracranial process. Electronically Signed   By: Sharlet Salina M.D.   On: 02/28/2023 22:35   DG Chest Portable 1 View  Result Date: 02/28/2023 CLINICAL DATA:  illness EXAM: PORTABLE CHEST 1 VIEW COMPARISON:  CXR 11/25/22 FINDINGS: Unchanged asymmetric elevation of the right hemidiaphragm. Left-sided dual lead cardiac device in place with unchanged lead positioning. No pleural effusion. No pneumothorax. Unchanged cardiac and mediastinal contours. No focal airspace opacity. No radiographically apparent displaced rib fractures. Visualized upper abdomen is unremarkable. There are surgical clips in the right upper quadrant. Degenerative changes of  the right glenohumeral joint. IMPRESSION: No focal airspace opacity Electronically Signed   By: Lorenza Cambridge M.D.   On: 02/28/2023 18:27    EKG:  My personal interpretation of EKG shows: Atrial paced rhythm, low voltage QRS, ventricular rate of 71 and no ST and T wave abnormality.    Assessment/Plan: Principal Problem:   Acute cystitis Active Problems:   Delirium secondary to UTI   Hypothyroidism   Essential hypertension   Sick sinus syndrome (HCC)   History of expressive aphasia   Chronic anemia   History of CVA (cerebrovascular accident)   History of right hemiplegia (HCC)   Atrial fibrillation, chronic (HCC)   History of seizure   Chronic hyponatremia   Assessment and Plan: Acute cystitis -Patient is afebrile on presentation.  Patient is poor historian.  Unable to state any dysuria/urgency and frequency.  Lab check no leukocytosis.  Lactic acid within normal range. - UA cloudy appearance, large leukocyte and rare bacteria. -Urine culture is processing - In the ED patient received ceftriaxone 1 g IV. - Development of delirium in the setting of acute cystitis.  Plan to continue empirically treating with ceftriaxone 1 g IV and will follow-up with urine culture result for appropriate antibiotic guidance. - Monitor urine output.   Delirium secondary to cystitis -Patient has history of expressive aphasia from previous cerebrovascular accident.  At nursing home patient found agitated.  While in the ED patient required morphine 4 mg to calm her down. -Concern for delirium in the setting of acute renal tract infection - At nursing home patient is on Haldol 1 mg every 6 hours as needed for agitation as well. - Resuming same dose of Haldol.  Hypothyroidism -Per chart review Synthroid has been stopped by her in the past. -Checking TSH and T4 level.    Essential hypertension Elevated blood pressure-improving -Initial presentation to ED blood pressure was 222/93 which improved to 171/91 after receiving hydralazine 10 mg IV.  Elevated blood pressure as as patient missed evening dose of blood pressure regimen. -Resumed home  Lopressor 25 mg twice daily -Adding amlodipine 5 mg daily.  Sick sinus syndrome s/p pacemaker placement Chronic atrial fibrillation -Patient is established care with cardiology with [facility] [place], last seen on 01/29/2023.  Per cardiology note patient supposed to be on Lovenox 40 mg twice daily for stroke prophylaxis and DVT prophylaxis note review from April 2024 however when patient visited cardiologist evidence 2024 she was not on Lovenox unclear it was discontinued or not recommended anymore? -Resumed home amiodarone 200 mg and Lopressor 25 mg twice daily. -Recent EKG EKG showed atrial paced rhythm sinus rhythm. -Please consult inpatient cardiology in the a.m. for recommendation for anticoagulation. -Continue cardiac monitoring   History of CVA with residual right-sided weakness and expressive aphasia -Patient has poor quality of life at the baseline.  Currently on aspirin and statin for secondary prevention.  PEG tube dependent for feeding.  And supportive care. -Continue aspirin 81 mg daily and statin 10 mg daily.   PEG tube feeding -Consulted inpatient dietitian to resume tube feeding.  Chronic hyponatremia -Lab check showed low sodium 132.  Per chart review patient has borderline low sodium in between 132-135. -Waiting for dietitian to resume tube feeding.  Need to adjust PEG tube water flush and continue to monitor serum sodium closely.  History of seizure -Continue home dose of all medications following: Dilantin 100 mg twice daily. valproic acid to 250 mg 10 AM and 4  PM and 750 mg at bedtime.  Keppra 1000 mg twice daily.  Chronic normocytic anemia - Stable H&H 13.3 and 43.1.  Continue to monitor.  Decubitus sacral ulcer - Consulted inpatient wound care  DVT prophylaxis:  Lovenox Code Status: Reviewed ACP.  Patient is Full Code Diet: Will resume PEG tube feeding consulted inpatient dietitian.   Family Communication:  Unable to reach out to patient's daughter over phone. Disposition Plan: Plan to discharge to nursing home in next 2 to 3 days. Consults: Cardiology, dietitian and wound consult. Admission status:   Inpatient, Telemetry bed  Severity of Illness: The appropriate patient status for this patient is INPATIENT. Inpatient status is judged to be reasonable and necessary in order to provide the required intensity of service to ensure the patient's safety. The patient's presenting symptoms, physical exam findings, and initial radiographic and laboratory data in the context of their chronic comorbidities is felt to place them at high risk for further clinical deterioration. Furthermore, it is not anticipated that the patient will be medically stable for discharge from the hospital within 2 midnights of admission.   * I certify that at the point of admission it is my clinical judgment that the patient will require inpatient hospital care spanning beyond 2 midnights from the point of admission due to high intensity of service, high risk for further deterioration and high frequency of surveillance required.Marland Kitchen    Tereasa Coop, MD [facility]  How to contact the [facility] Attending or Consulting provider 7A - 7P or covering provider during after hours 7P -7A, for this patient.  Check the care team in [facility] and look for a) attending/consulting [facility] provider listed and b) the [facility] team listed Log into [email or web address] and use [place]'s universal password to access. If you do not have the password, please contact the hospital operator. Locate the [facility] provider you are looking for under [facility] and page to a number that you can be directly reached. If you still have difficulty reaching the provider, please page the [facility] (Director on Call) for the Hospitalists listed on amion for assistance.  03/01/2023, 5:31 AM

## 2023-02-28 NOTE — ED Notes (Signed)
Recollect lavender sent to lab, lab notified

## 2023-02-28 NOTE — ED Triage Notes (Signed)
Pt BIBA, currently yelling uncontrollably, from [facility] with c/o UTI confirmed by urinalysis yesterday and G-tube cap was ripped off but was still in use by staff, does not seem to be displaced or redness noted. Speech deficits. Diaphoretic noted by staff.   HR 80  CBG 84 170/100 20G L hand

## 2023-02-28 NOTE — ED Notes (Signed)
Pt bed bound and have pressure sores on backside but unsure of what stage per EMS.

## 2023-02-28 NOTE — ED Notes (Signed)
Pt has been changed and extra linen removed from the bed.

## 2023-03-01 ENCOUNTER — Inpatient Hospital Stay (HOSPITAL_COMMUNITY): Payer: Medicare HMO

## 2023-03-01 ENCOUNTER — Encounter (HOSPITAL_COMMUNITY): Payer: Self-pay | Admitting: Internal Medicine

## 2023-03-01 DIAGNOSIS — R1310 Dysphagia, unspecified: Secondary | ICD-10-CM | POA: Diagnosis present

## 2023-03-01 DIAGNOSIS — G9341 Metabolic encephalopathy: Secondary | ICD-10-CM | POA: Diagnosis present

## 2023-03-01 DIAGNOSIS — E669 Obesity, unspecified: Secondary | ICD-10-CM | POA: Diagnosis present

## 2023-03-01 DIAGNOSIS — L89154 Pressure ulcer of sacral region, stage 4: Secondary | ICD-10-CM | POA: Diagnosis present

## 2023-03-01 DIAGNOSIS — J45909 Unspecified asthma, uncomplicated: Secondary | ICD-10-CM | POA: Diagnosis present

## 2023-03-01 DIAGNOSIS — I495 Sick sinus syndrome: Secondary | ICD-10-CM | POA: Diagnosis present

## 2023-03-01 DIAGNOSIS — Z931 Gastrostomy status: Secondary | ICD-10-CM | POA: Diagnosis not present

## 2023-03-01 DIAGNOSIS — I69351 Hemiplegia and hemiparesis following cerebral infarction affecting right dominant side: Secondary | ICD-10-CM | POA: Diagnosis not present

## 2023-03-01 DIAGNOSIS — Z79899 Other long term (current) drug therapy: Secondary | ICD-10-CM | POA: Diagnosis not present

## 2023-03-01 DIAGNOSIS — K9413 Enterostomy malfunction: Secondary | ICD-10-CM | POA: Diagnosis present

## 2023-03-01 DIAGNOSIS — K219 Gastro-esophageal reflux disease without esophagitis: Secondary | ICD-10-CM | POA: Diagnosis present

## 2023-03-01 DIAGNOSIS — L89892 Pressure ulcer of other site, stage 2: Secondary | ICD-10-CM | POA: Diagnosis present

## 2023-03-01 DIAGNOSIS — Z7982 Long term (current) use of aspirin: Secondary | ICD-10-CM | POA: Diagnosis not present

## 2023-03-01 DIAGNOSIS — E039 Hypothyroidism, unspecified: Secondary | ICD-10-CM | POA: Diagnosis present

## 2023-03-01 DIAGNOSIS — N3000 Acute cystitis without hematuria: Secondary | ICD-10-CM

## 2023-03-01 DIAGNOSIS — F05 Delirium due to known physiological condition: Secondary | ICD-10-CM | POA: Diagnosis present

## 2023-03-01 DIAGNOSIS — B9689 Other specified bacterial agents as the cause of diseases classified elsewhere: Secondary | ICD-10-CM | POA: Diagnosis present

## 2023-03-01 DIAGNOSIS — B9620 Unspecified Escherichia coli [E. coli] as the cause of diseases classified elsewhere: Secondary | ICD-10-CM | POA: Diagnosis present

## 2023-03-01 DIAGNOSIS — G40909 Epilepsy, unspecified, not intractable, without status epilepticus: Secondary | ICD-10-CM | POA: Diagnosis present

## 2023-03-01 DIAGNOSIS — I482 Chronic atrial fibrillation, unspecified: Secondary | ICD-10-CM | POA: Diagnosis present

## 2023-03-01 DIAGNOSIS — R41841 Cognitive communication deficit: Secondary | ICD-10-CM | POA: Diagnosis present

## 2023-03-01 DIAGNOSIS — Z87898 Personal history of other specified conditions: Secondary | ICD-10-CM

## 2023-03-01 DIAGNOSIS — E871 Hypo-osmolality and hyponatremia: Secondary | ICD-10-CM | POA: Diagnosis present

## 2023-03-01 DIAGNOSIS — E785 Hyperlipidemia, unspecified: Secondary | ICD-10-CM | POA: Diagnosis present

## 2023-03-01 DIAGNOSIS — Z87891 Personal history of nicotine dependence: Secondary | ICD-10-CM | POA: Diagnosis not present

## 2023-03-01 DIAGNOSIS — I10 Essential (primary) hypertension: Secondary | ICD-10-CM | POA: Diagnosis present

## 2023-03-01 DIAGNOSIS — R627 Adult failure to thrive: Secondary | ICD-10-CM | POA: Diagnosis present

## 2023-03-01 LAB — TSH: TSH: 4.987 u[IU]/mL — ABNORMAL HIGH (ref 0.350–4.500)

## 2023-03-01 LAB — CBC
HCT: 39 % (ref 36.0–46.0)
HCT: 42.3 % (ref 36.0–46.0)
Hemoglobin: 12.1 g/dL (ref 12.0–15.0)
Hemoglobin: 13.4 g/dL (ref 12.0–15.0)
MCH: 29.4 pg (ref 26.0–34.0)
MCH: 29.3 pg (ref 26.0–34.0)
MCHC: 31 g/dL (ref 30.0–36.0)
MCHC: 31.7 g/dL (ref 30.0–36.0)
MCV: 94.7 fL (ref 80.0–100.0)
MCV: 92.6 fL (ref 80.0–100.0)
Platelets: 159 10*3/uL (ref 150–400)
Platelets: 171 10*3/uL (ref 150–400)
RBC: 4.12 MIL/uL (ref 3.87–5.11)
RBC: 4.57 MIL/uL (ref 3.87–5.11)
RDW: 16.5 % — ABNORMAL HIGH (ref 11.5–15.5)
RDW: 16.5 % — ABNORMAL HIGH (ref 11.5–15.5)
WBC: 7.1 10*3/uL (ref 4.0–10.5)
WBC: 8.3 10*3/uL (ref 4.0–10.5)
nRBC: 0 % (ref 0.0–0.2)
nRBC: 0 % (ref 0.0–0.2)

## 2023-03-01 LAB — COMPREHENSIVE METABOLIC PANEL
ALT: 22 U/L (ref 0–44)
AST: 44 U/L — ABNORMAL HIGH (ref 15–41)
Albumin: 1.6 g/dL — ABNORMAL LOW (ref 3.5–5.0)
Alkaline Phosphatase: 108 U/L (ref 38–126)
Anion gap: 4 — ABNORMAL LOW (ref 5–15)
BUN: 17 mg/dL (ref 8–23)
CO2: 23 mmol/L (ref 22–32)
Calcium: 6.5 mg/dL — ABNORMAL LOW (ref 8.9–10.3)
Chloride: 109 mmol/L (ref 98–111)
Creatinine, Ser: 0.34 mg/dL — ABNORMAL LOW (ref 0.44–1.00)
GFR, Estimated: >60 mL/min (ref >60)
Glucose, Bld: 75 mg/dL (ref 70–99)
Potassium: 3.3 mmol/L — ABNORMAL LOW (ref 3.5–5.1)
Sodium: 136 mmol/L (ref 135–145)
Total Bilirubin: 0.4 mg/dL (ref 0.3–1.2)
Total Protein: 6.6 g/dL (ref 6.5–8.1)

## 2023-03-01 LAB — GLUCOSE, CAPILLARY: Glucose-Capillary: 84 mg/dL (ref 70–99)

## 2023-03-01 LAB — PHOSPHORUS: Phosphorus: 3.3 mg/dL (ref 2.5–4.6)

## 2023-03-01 LAB — T4, FREE: Free T4: 1.04 ng/dL (ref 0.61–1.12)

## 2023-03-01 LAB — MAGNESIUM: Magnesium: 1.6 mg/dL — ABNORMAL LOW (ref 1.7–2.4)

## 2023-03-01 MED ORDER — HYDRALAZINE HCL 25 MG PO TABS: 25.0000 mg | ORAL_TABLET | Freq: Four times a day (QID) | ORAL | Status: DC | PRN

## 2023-03-01 MED ORDER — POLYETHYLENE GLYCOL 3350 17 G PO PACK: 34.0000 g | PACK | Freq: Every day | ORAL | Status: DC | PRN

## 2023-03-01 MED ORDER — ASPIRIN 81 MG PO CHEW
81.0000 mg | CHEWABLE_TABLET | Freq: Every day | ORAL | Status: DC
  Administered 2023-03-01 – 2023-03-06 (×6): 81 mg via ORAL
  Filled 2023-03-01 (×6): qty 1

## 2023-03-01 MED ORDER — OSMOLITE 1.2 CAL PO LIQD
1000.0000 mL | ORAL | Status: DC
  Administered 2023-03-01 – 2023-03-02 (×3): 1000 mL
  Filled 2023-03-01 (×4): qty 1000

## 2023-03-01 MED ORDER — FOLIC ACID 1 MG PO TABS
1.0000 mg | ORAL_TABLET | Freq: Every day | ORAL | Status: DC
  Administered 2023-03-01 – 2023-03-06 (×6): 1 mg
  Filled 2023-03-01 (×6): qty 1

## 2023-03-01 MED ORDER — FAMOTIDINE 40 MG/5ML PO SUSR
40.0000 mg | Freq: Every day | ORAL | Status: DC
  Administered 2023-03-01 – 2023-03-06 (×6): 40 mg
  Filled 2023-03-01 (×6): qty 5

## 2023-03-01 MED ORDER — ZINC 50 MG PO TABS: 50.0000 mg | ORAL_TABLET | Freq: Every day | ORAL | Status: DC

## 2023-03-01 MED ORDER — SENNOSIDES-DOCUSATE SODIUM 8.6-50 MG PO TABS
2.0000 | ORAL_TABLET | Freq: Two times a day (BID) | ORAL | Status: DC
  Administered 2023-03-01 – 2023-03-02 (×3): 2
  Filled 2023-03-01 (×6): qty 2

## 2023-03-01 MED ORDER — LEVETIRACETAM 100 MG/ML PO SOLN
1000.0000 mg | Freq: Two times a day (BID) | ORAL | Status: DC
  Administered 2023-03-01 – 2023-03-06 (×12): 1000 mg
  Filled 2023-03-01 (×13): qty 10

## 2023-03-01 MED ORDER — ONDANSETRON HCL 4 MG/2ML IJ SOLN: 4.0000 mg | Freq: Four times a day (QID) | INTRAMUSCULAR | Status: DC | PRN

## 2023-03-01 MED ORDER — ENOXAPARIN SODIUM 40 MG/0.4ML IJ SOSY
40.0000 mg | PREFILLED_SYRINGE | INTRAMUSCULAR | Status: DC
  Administered 2023-03-01 – 2023-03-06 (×6): 40 mg via SUBCUTANEOUS
  Filled 2023-03-01 (×6): qty 0.4

## 2023-03-01 MED ORDER — SIMVASTATIN 10 MG PO TABS
10.0000 mg | ORAL_TABLET | Freq: Every day | ORAL | Status: DC
  Administered 2023-03-01 – 2023-03-05 (×5): 10 mg
  Filled 2023-03-01 (×5): qty 1

## 2023-03-01 MED ORDER — PHENYTOIN 125 MG/5ML PO SUSP
100.0000 mg | Freq: Two times a day (BID) | ORAL | Status: DC
  Administered 2023-03-01 (×2): 100 mg
  Filled 2023-03-01 (×2): qty 4

## 2023-03-01 MED ORDER — PROSOURCE TF PO LIQD
45.0000 mL | Freq: Three times a day (TID) | ORAL | Status: DC
  Administered 2023-03-01: 45 mL
  Filled 2023-03-01 (×2): qty 45

## 2023-03-01 MED ORDER — ZINC OXIDE 40 % EX OINT
TOPICAL_OINTMENT | Freq: Three times a day (TID) | CUTANEOUS | Status: DC
  Administered 2023-03-02 (×3): 1 via TOPICAL
  Filled 2023-03-01: qty 57

## 2023-03-01 MED ORDER — VALPROIC ACID 250 MG/5ML PO SOLN
500.0000 mg | Freq: Two times a day (BID) | ORAL | Status: DC
  Administered 2023-03-01 – 2023-03-06 (×11): 500 mg
  Filled 2023-03-01 (×13): qty 10

## 2023-03-01 MED ORDER — HALOPERIDOL LACTATE 5 MG/ML IJ SOLN
1.0000 mg | Freq: Four times a day (QID) | INTRAMUSCULAR | Status: DC | PRN
  Administered 2023-03-02: 1 mg via INTRAVENOUS
  Filled 2023-03-01: qty 1

## 2023-03-01 MED ORDER — AMIODARONE HCL 200 MG PO TABS
200.0000 mg | ORAL_TABLET | Freq: Every day | ORAL | Status: DC
  Administered 2023-03-01 – 2023-03-06 (×6): 200 mg
  Filled 2023-03-01 (×6): qty 1

## 2023-03-01 MED ORDER — ENOXAPARIN SODIUM 40 MG/0.4ML IJ SOSY: 40.0000 mg | PREFILLED_SYRINGE | INTRAMUSCULAR | Status: DC

## 2023-03-01 MED ORDER — SODIUM CHLORIDE 0.9 % IV SOLN: 1.0000 g | INTRAVENOUS | Status: DC

## 2023-03-01 MED ORDER — IOHEXOL 300 MG/ML  SOLN
30.0000 mL | Freq: Once | INTRAMUSCULAR | Status: AC | PRN
  Administered 2023-03-01: 30 mL

## 2023-03-01 MED ORDER — VITAMIN D 25 MCG (1000 UNIT) PO TABS
1000.0000 [IU] | ORAL_TABLET | Freq: Every day | ORAL | Status: DC
  Administered 2023-03-01 – 2023-03-05 (×5): 1000 [IU]
  Filled 2023-03-01 (×5): qty 1

## 2023-03-01 MED ORDER — ACETAMINOPHEN 160 MG/5ML PO SOLN
640.0000 mg | Freq: Three times a day (TID) | ORAL | Status: DC
  Administered 2023-03-01 – 2023-03-06 (×15): 640 mg
  Filled 2023-03-01 (×7): qty 20.3
  Filled 2023-03-01: qty 20
  Filled 2023-03-01 (×7): qty 20.3

## 2023-03-01 MED ORDER — HYDRALAZINE HCL 20 MG/ML IJ SOLN: 10.0000 mg | Freq: Four times a day (QID) | INTRAMUSCULAR | Status: DC | PRN

## 2023-03-01 MED ORDER — HYDROCODONE-ACETAMINOPHEN 5-325 MG PO TABS
1.0000 | ORAL_TABLET | Freq: Every day | ORAL | Status: DC | PRN
  Administered 2023-03-02 – 2023-03-03 (×2): 1
  Filled 2023-03-01 (×2): qty 1

## 2023-03-01 MED ORDER — ZINC SULFATE 220 (50 ZN) MG PO CAPS
220.0000 mg | ORAL_CAPSULE | Freq: Every day | ORAL | Status: DC
  Administered 2023-03-01 – 2023-03-06 (×6): 220 mg
  Filled 2023-03-01 (×6): qty 1

## 2023-03-01 MED ORDER — SIMETHICONE 40 MG/0.6ML PO SUSP
80.0000 mg | Freq: Every day | ORAL | Status: DC
  Administered 2023-03-01 – 2023-03-06 (×6): 80 mg
  Filled 2023-03-01 (×6): qty 1.2

## 2023-03-01 MED ORDER — CYCLOBENZAPRINE HCL 5 MG PO TABS
2.5000 mg | ORAL_TABLET | Freq: Two times a day (BID) | ORAL | Status: DC
  Administered 2023-03-01 – 2023-03-06 (×11): 2.5 mg
  Filled 2023-03-01 (×10): qty 1
  Filled 2023-03-01: qty 0.5

## 2023-03-01 MED ORDER — SODIUM CHLORIDE 0.9% FLUSH: 3.0000 mL | INTRAVENOUS | Status: DC | PRN

## 2023-03-01 MED ORDER — DIAZEPAM 10 MG/0.1ML NA LIQD: 20.0000 mg | NASAL | Status: DC | PRN

## 2023-03-01 MED ORDER — JUVEN PO PACK
1.0000 | PACK | Freq: Two times a day (BID) | ORAL | Status: DC
  Administered 2023-03-01: 1
  Filled 2023-03-01 (×2): qty 1

## 2023-03-01 MED ORDER — AMIODARONE HCL 200 MG PO TABS: 200.0000 mg | ORAL_TABLET | Freq: Every day | ORAL | Status: DC

## 2023-03-01 MED ORDER — HYDROXYZINE HCL 10 MG PO TABS: 10.0000 mg | ORAL_TABLET | Freq: Three times a day (TID) | ORAL | Status: DC

## 2023-03-01 MED ORDER — METOPROLOL TARTRATE 25 MG PO TABS
25.0000 mg | ORAL_TABLET | Freq: Two times a day (BID) | ORAL | Status: DC
  Administered 2023-03-01 – 2023-03-06 (×12): 25 mg
  Filled 2023-03-01 (×12): qty 1

## 2023-03-01 MED ORDER — AMLODIPINE BESYLATE 10 MG PO TABS
5.0000 mg | ORAL_TABLET | Freq: Every day | ORAL | Status: DC
  Administered 2023-03-01: 5 mg
  Filled 2023-03-01: qty 1

## 2023-03-01 MED ORDER — PHENYTOIN 125 MG/5ML PO SUSP
75.0000 mg | Freq: Two times a day (BID) | ORAL | Status: DC
  Administered 2023-03-01 – 2023-03-06 (×10): 75 mg
  Filled 2023-03-01 (×10): qty 4

## 2023-03-01 MED ORDER — AMLODIPINE BESYLATE 10 MG PO TABS
10.0000 mg | ORAL_TABLET | Freq: Every day | ORAL | Status: DC
  Administered 2023-03-02 – 2023-03-06 (×5): 10 mg
  Filled 2023-03-01 (×5): qty 1

## 2023-03-01 MED ORDER — VALPROIC ACID 250 MG/5ML PO SOLN
750.0000 mg | Freq: Every day | ORAL | Status: DC
  Administered 2023-03-01 – 2023-03-05 (×5): 750 mg via ORAL
  Filled 2023-03-01 (×5): qty 15

## 2023-03-01 MED ORDER — FOLIC ACID 800 MCG PO TABS: 800.0000 ug | ORAL_TABLET | Freq: Every day | ORAL | Status: DC

## 2023-03-01 MED ORDER — PROSOURCE TF20 ENFIT COMPATIBL EN LIQD
60.0000 mL | Freq: Every day | ENTERAL | Status: DC
  Administered 2023-03-01 – 2023-03-03 (×3): 60 mL
  Filled 2023-03-01 (×3): qty 60

## 2023-03-01 MED ORDER — SODIUM CHLORIDE 0.9 % IV SOLN: 250.0000 mL | INTRAVENOUS | Status: DC | PRN

## 2023-03-01 MED ORDER — SODIUM CHLORIDE 0.9 % IV SOLN
1.0000 g | INTRAVENOUS | Status: DC
  Administered 2023-03-01: 1 g via INTRAVENOUS
  Filled 2023-03-01: qty 10

## 2023-03-01 MED ORDER — SODIUM CHLORIDE 0.9% FLUSH
3.0000 mL | Freq: Two times a day (BID) | INTRAVENOUS | Status: DC
  Administered 2023-03-01 – 2023-03-06 (×11): 3 mL via INTRAVENOUS

## 2023-03-01 NOTE — ED Notes (Signed)
Tube administration seemed more difficult than expected. 2 RN's checked the tube and were able to push through it. MD made aware.

## 2023-03-01 NOTE — Consult Note (Signed)
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 Also with MASD (irritant contact dermatitis) to buttock (left), posterior thighs. Consult performed remotely following review of EMR including photographs.Patient known to our department and this writer from previous admissions. Known failure to thrive in an adult.  Wound type:pressure, moisture  ICD-10 CM Codes for Irritant Dermatitis  L24A2 - Due to fecal, urinary or dual incontinence L24A9 - Due to friction or contact with other specified body fluids  Pressure Injury POA: Yes Measurement:To be obtained by Bedside RN with application of next dressing change today. Wound bed: wound with contraction, wound bed not easily visible. Red. Drainage (amount, consistency, odor) Moderate serosanguinous Periwound:with evidence of previous wound healing, contraction, scarring Dressing procedure/placement/frequency:Patient is to be turned and repositioned from side to side HOB is to be kept to the lowest tolerable position considering respiratory and feeding priorities. A 30 degree angle is proposed/recommended. Bilateral pressure redistribution heel boots are provided. Topical care to the ICD is Desitin ointment applied after cleansing, 3 times daily and PRN soiling. Wound care will be with a daily cleanse, drying of the surrounding skin and filling of the defect with size appropriate silver hydrofiber (Aquacel Ag+ Advantage). It is recommended that a piece of dressing be left protruding from the wound to assist in retrieval.  WOC nursing team will not follow, but will remain available to this patient, the nursing and medical teams.  Please re-consult if needed.  Thank you for inviting [place] to participate in this patient's Plan of Care.  Ladona Mow, MSN, RN, CNS, GNP, Leda Min, [employer], [employer] phone:  [phone]

## 2023-03-01 NOTE — Progress Notes (Signed)
Initial Nutrition Assessment  DOCUMENTATION CODES:   Obesity unspecified  INTERVENTION:  - Per attending Dr. [place], can start tube feeds today.   - Initiate tube feeding via J-tube: Osmolite 1.2 at 65 ml/h  *Start at 61mL/hr and advance by 10mL Q8H to goal  Prosource TF20 60 ml daily Provides 1952 kcal, 106 gm protein, 1279 ml free water daily  - Monitor magnesium, potassium, and phosphorus BID for at least 3 days, MD to replete as needed.  - Daily weights.    NUTRITION DIAGNOSIS:   Inadequate oral intake related to inability to eat as evidenced by NPO status.  GOAL:   Patient will meet greater than or equal to 90% of their needs  MONITOR:   Labs, Weight trends, TF tolerance  REASON FOR ASSESSMENT:   Consult Enteral/tube feeding initiation and management  ASSESSMENT:   72 y.o. female with PMH of right-sided hemiplegia and expressive aphasia secondary to stroke in 2018, chronic atrial fibrillation, sick sinus syndrome on pacemaker, seizure, essential HTN, hypothyroidism, chronic anemia, gastric bypass in 2008, and J-tube dependent who presented from nursing home due to worsening delirium and concern for UTI. Admitted with acute cystitis.   Patient noted to have expressive aphasia. H&P indicated history from patient very limited.   Per chart review, weight appears stable over the past 3 months. However, all 3 weights taken are exactly the same so question if weights have been carried over each time. Will order a new weight.  Per review of home meds list, it was reported patient is not Jevity 1.2 (at 25mL/hr), ProSource TF TID, and Juven at nursing home. Therefore, unclear what tube feed patient has been receiving at nursing home. She is noted to be taking 500mg  vitamin C BID, folic acid, and 50mg  zinc.   Currently ordered ProSource TF TID and Juven BID.  H&P reports patient tried to pull out her feeding tube but it did not appear to be displaced. IR's note  today indicates a KUB with contrast was obtained and showed J-tube in appropriate position.   Per discussion with attending Dr. [place], can start tube feeds today.   At this time it is unclear what tube feed regimen patient has been on at facility and unclear when the last time she received tube feeds was. Due to this, will start Osmolite 1.2 (house formula) at only 85mL/hr and advance slowly to assess electrolytes. Especially as potassium low this AM.  Will discontinue Juven for now. No skin assessment so follow for RN skin assessment to determine need to add Juven back and determine need to continue currently ordered Zinc. Will also need to try to determine how long patient has been receiving zinc as prolonged supplementation can result in copper deficiency.    Medications reviewed and include: 1000 units vitamin D, 1mg  folic acid, Senokot, 220mg  zinc   Labs reviewed:  K+ 3.3   NUTRITION - FOCUSED PHYSICAL EXAM:  RD working remotely  Diet Order:   Diet Order             Diet NPO time specified  Diet effective now                   EDUCATION NEEDS:  Not appropriate for education at this time  Skin:     *No RN skin assessment yet completed  Last BM:  unknown  Height:  Ht Readings from Last 1 Encounters:  02/28/23 5\' 5"  (1.651 m)   Weight:  Wt Readings from Last  1 Encounters:  02/28/23 88 kg   Ideal Body Weight:  56.82 kg  BMI:  Body mass index is 32.28 kg/m.  Estimated Nutritional Needs:  Kcal:  1800-2000 kcals Protein:  90-105 grams Fluid:  >/= 1.8L    Shelle Iron RD, LDN For contact information, refer to [facility].

## 2023-03-01 NOTE — ED Notes (Signed)
Pt with wounds to buttock, back of left thigh and to inner right thigh. Dr Silverio Lay in to assess. Pictures taken for chart. 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

## 2023-03-01 NOTE — Plan of Care (Signed)
IR was requested for PEG tube eval.   Patient has 16 Fr direct J tube in place, last exchanged by Dr. Elby Showers on 7/1/ due to dislodgement.    KUB with contrast obtained which shows J tube in the appropriate position, no interventions needed.    Will delete the IR Rad eval order.  Please call IR for questions and concerns.   Lynann Bologna  PA-C 03/01/2023 2:36 PM

## 2023-03-01 NOTE — Progress Notes (Signed)
PROGRESS NOTE    Kathleen Jensen  ZOX:[id number] DOB: 09/24/1950 DOA: 02/28/2023 PCP: System, Provider Not In    Brief Narrative:   Kathleen Jensen is a 72 y.o. female with past medical history significant for CVA with residual expressive aphasia/right-sided hemiplegia, chronic atrial fibrillation not on anticoagulation, SSS s/p PPM, seizure disorder, HTN, hypothyroidism, anemia of chronic medical disease, adult failure to thrive on PEG tube feeding who presented to [facility] ED on 8/2 via EMS from [facility] with altered mental status.  Staff at facility concern for UTI.  Mental status not at baseline as well.  Apparently patient had been yelling uncomfortably and trying to pull out her G-tube.  Unable obtain any further ROS given her mental status complicated by her underlying aphasia.  In the ED, temperature 98.2 F, HR 59, RR 18, BP 202/93, SpO2 96% on room air.  WBC 7.6, hemoglobin 13.3, platelets 163.  Sodium 132, potassium 4.4, chloride 98, CO2 29, glucose 82, BUN 21, creatinine 0.52.  Alk phos 171, AST 61, ALT 25, total globin 0.6.  Lactic acid 1.7.  Urinalysis with large leukocytes, rare bacteria, greater than 50 WBCs.  CT head without contrast with diffuse cerebral atrophy and chronic ischemic changes, no acute intracranial process although limited study due to patient motion.  Chest x-ray with no focal airspace opacities.  Patient was started on peritoneal box.  TRH consulted for admission for further evaluation management of acute metabolic encephalopathy secondary to UTI.  Assessment & Plan:   Acute metabolic encephalopathy: Improving Patient presenting from a skilled nursing facility with worsening confusion that is off from her typical baseline per nursing staff.  Etiology likely secondary to urinary tract infection.  CT head without contrast unrevealing. --Continue treatment as below, supportive care  Urinary tract infection Urinalysis with large  leukocytes, negative nitrite, greater than 50 WBCs. -- Urine culture: Pending -- Ceftriaxone 1 g IV every 24 hours  Hx CVA with residual expressive aphasia/right-sided hemiplegia -- Continue aspirin and statin  Chronic atrial fibrillation -- Amiodarone 200 mg per tube daily -- Metoprolol tartrate 25 mg per tube twice daily -- Not on anticoagulation  SSS s/p PPM EKG with atrial paced rhythm  Seizure disorder -- Dilantin 75 mg per tube twice daily -- valproic acid 500 mg per tube twice daily; 750 mg p.o. nightly -- Keppra 1000 mg per tube daily  Essential hypertension -- Started on amlodipine 10 mg per tube daily given elevated blood pressure on admission -- Metoprolol tartrate 25 mg per tube twice daily -- Hydralazine 25 mg per tube q6h PRN SBP >165 or DBP >110 -- Continue aspirin and statin  Chronic hyponatremia Baseline sodium 132-135, sodium on admission 132.  Restarting tube feeds. -- BMP in a.m.  Hypothyroidism TSH 4.987, free T4 1.04.  Currently not on thyroid replacement medication  GERD -- Pepcid 40 mg per tube daily  Adult failure to thrive IR performed PEG tube check, in place and functional.  Continue tube feeds per RD.   DVT prophylaxis: enoxaparin (LOVENOX) injection 40 mg Start: 03/01/23 1000 SCDs Start: 03/01/23 0231    Code Status: Full Code Family Communication:   Disposition Plan:  Level of care: Progressive Status is: Inpatient Remains inpatient appropriate because: IV antibiotics, anticipate discharge back to SNF in 2-3 days    Consultants:  None  Procedures:  IR tube check  Antimicrobials:  Ceftriaxone 8/2>>   Subjective: Patient seen examined at bedside, lying in bed.  Mains in ED holding  area.  Aphasic but appears comfortable, pleasantly confused.  No family present.  Unable to obtain any further ROS due to her underlying dysphagia.  No acute concerns overnight per nursing staff.  Objective: Vitals:   03/01/23 1130 03/01/23 1145  03/01/23 1200 03/01/23 1436  BP: (!) 169/108 (!) 166/84 (!) 157/87 (!) 170/78  Pulse: 62 63 60 60  Resp: 17 17 20    Temp:    97.8 F (36.6 C)  TempSrc:    Oral  SpO2: 96% 98% 97% 97%  Weight:      Height:        Intake/Output Summary (Last 24 hours) at 03/01/2023 1555 Last data filed at 02/28/2023 2201 Gross per 24 hour  Intake --  Output 700 ml  Net -700 ml   Filed Weights   02/28/23 1801  Weight: 88 kg    Examination:  Physical Exam: GEN: NAD, chronically ill-appearing, aphasic HEENT: NCAT, PERRL, EOMI, sclera clear, dry mucous membranes PULM: CTAB w/o wheezes/crackles, normal respiratory effort, on room air CV: RRR w/o M/G/R GI: abd soft, NTND, NABS, PEG tube noted in place GU: Foley catheter noted in place MSK: no peripheral edema    Data Reviewed: I have personally reviewed following labs and imaging studies  CBC: Recent Labs  Lab 02/28/23 2124 03/01/23 0500  WBC 7.6 7.1  NEUTROABS 3.4  --   HGB 13.3 12.1  HCT 43.1 39.0  MCV 94.7 94.7  PLT 163 159   Basic Metabolic Panel: Recent Labs  Lab 02/28/23 2020 03/01/23 0500  NA 132* 136  K 4.4 3.3*  CL 98 109  CO2 29 23  GLUCOSE 82 75  BUN 21 17  CREATININE 0.52 0.34*  CALCIUM 8.2* 6.5*   GFR: Estimated Creatinine Clearance: 69.6 mL/min (A) (by C-G formula based on SCr of 0.34 mg/dL (L)). Liver Function Tests: Recent Labs  Lab 02/28/23 2020 03/01/23 0500  AST 61* 44*  ALT 25 22  ALKPHOS 171* 108  BILITOT 0.6 0.4  PROT 9.1* 6.6  ALBUMIN 2.3* 1.6*   No results for input(s): "LIPASE", "AMYLASE" in the last 168 hours. No results for input(s): "AMMONIA" in the last 168 hours. Coagulation Profile: No results for input(s): "INR", "PROTIME" in the last 168 hours. Cardiac Enzymes: No results for input(s): "CKTOTAL", "CKMB", "CKMBINDEX", "TROPONINI" in the last 168 hours. BNP (last 3 results) No results for input(s): "PROBNP" in the last 8760 hours. HbA1C: No results for input(s): "HGBA1C" in the  last 72 hours. CBG: No results for input(s): "GLUCAP" in the last 168 hours. Lipid Profile: No results for input(s): "CHOL", "HDL", "LDLCALC", "TRIG", "CHOLHDL", "LDLDIRECT" in the last 72 hours. Thyroid Function Tests: Recent Labs    03/01/23 0500  TSH 4.987*  FREET4 1.04   Anemia Panel: No results for input(s): "VITAMINB12", "FOLATE", "FERRITIN", "TIBC", "IRON", "RETICCTPCT" in the last 72 hours. Sepsis Labs: Recent Labs  Lab 02/28/23 2027  LATICACIDVEN 1.7    No results found for this or any previous visit (from the past 240 hour(s)).       Radiology Studies: DG ABDOMEN PEG TUBE LOCATION  Result Date: 03/01/2023 CLINICAL DATA:  J-tube placement. EXAM: ABDOMEN - 1 VIEW COMPARISON:  01/26/2023 FINDINGS: KUB obtained after injection of 30 cc Omnipaque through the patient's J-tube. Jejunal loops in the central abdomen are opacified consistent with J-tube tip position in the jejunum. There is diffuse gaseous distention of bowel in the visualized abdomen, similar to prior. IMPRESSION: J-tube tip is in the jejunum. Electronically Signed  By: Kennith Center M.D.   On: 03/01/2023 14:40   CT Head Wo Contrast  Result Date: 02/28/2023 CLINICAL DATA:  Altered level of consciousness EXAM: CT HEAD WITHOUT CONTRAST TECHNIQUE: Contiguous axial images were obtained from the base of the skull through the vertex without intravenous contrast. RADIATION DOSE REDUCTION: This exam was performed according to the departmental dose-optimization program which includes automated exposure control, adjustment of the mA and/or kV according to patient size and/or use of iterative reconstruction technique. COMPARISON:  11/16/2022 FINDINGS: Evaluation is extremely limited by patient motion throughout the exam. Brain: Chronic encephalomalacia within the left parietal lobe. Diffuse cerebral atrophy unchanged. No evidence of acute infarct or hemorrhage. Ex vacuo dilatation of the lateral ventricles. Midline structures  are unremarkable. No acute extra-axial fluid collections. No mass effect. Vascular: No hyperdense vessel or unexpected calcification. Skull: Normal. Negative for fracture or focal lesion. Sinuses/Orbits: No acute finding. Other: None. IMPRESSION: 1. Limited study due to patient motion. 2. Diffuse cerebral atrophy and chronic ischemic changes as above. No acute intracranial process. Electronically Signed   By: Sharlet Salina M.D.   On: 02/28/2023 22:35   DG Chest Portable 1 View  Result Date: 02/28/2023 CLINICAL DATA:  illness EXAM: PORTABLE CHEST 1 VIEW COMPARISON:  CXR 11/25/22 FINDINGS: Unchanged asymmetric elevation of the right hemidiaphragm. Left-sided dual lead cardiac device in place with unchanged lead positioning. No pleural effusion. No pneumothorax. Unchanged cardiac and mediastinal contours. No focal airspace opacity. No radiographically apparent displaced rib fractures. Visualized upper abdomen is unremarkable. There are surgical clips in the right upper quadrant. Degenerative changes of the right glenohumeral joint. IMPRESSION: No focal airspace opacity Electronically Signed   By: Lorenza Cambridge M.D.   On: 02/28/2023 18:27        Scheduled Meds:  acetaminophen  640 mg Per Tube Q8H   amiodarone  200 mg Per Tube Daily   amLODipine  5 mg Per Tube Daily   aspirin  81 mg Oral Daily   cholecalciferol  1,000 Units Per Tube QHS   cyclobenzaprine  2.5 mg Per Tube BID   enoxaparin (LOVENOX) injection  40 mg Subcutaneous Q24H   famotidine  40 mg Per Tube Daily   feeding supplement (OSMOLITE 1.2 CAL)  1,000 mL Per Tube Q24H   feeding supplement (PROSource TF20)  60 mL Per Tube Daily   folic acid  1 mg Per Tube Daily   levETIRAcetam  1,000 mg Per Tube BID   metoprolol tartrate  25 mg Per Tube BID   nystatin   Topical Once   phenytoin  75 mg Per Tube BID   senna-docusate  2 tablet Per Tube BID   simethicone  80 mg Per Tube Daily   simvastatin  10 mg Per Tube QHS   sodium chloride flush  3  mL Intravenous Q12H   valproic acid  500 mg Per Tube BID   valproic acid  750 mg Oral QHS   zinc sulfate  220 mg Per Tube Daily   Continuous Infusions:  sodium chloride     cefTRIAXone (ROCEPHIN)  IV       LOS: 0 days    Time spent: 52 minutes spent on chart review, discussion with nursing staff, consultants, updating family and interview/physical exam; more than 50% of that time was spent in counseling and/or coordination of care.    Alvira Philips [place], DO [facility] Available via Epic secure chat 7am-7pm After these hours, please refer to coverage provider listed on [email or web address] 03/01/2023,  3:55 PM

## 2023-03-02 ENCOUNTER — Other Ambulatory Visit: Payer: Self-pay

## 2023-03-02 DIAGNOSIS — N3000 Acute cystitis without hematuria: Secondary | ICD-10-CM

## 2023-03-02 LAB — GLUCOSE, CAPILLARY
Glucose-Capillary: 101 mg/dL — ABNORMAL HIGH (ref 70–99)
Glucose-Capillary: 101 mg/dL — ABNORMAL HIGH (ref 70–99)
Glucose-Capillary: 82 mg/dL (ref 70–99)
Glucose-Capillary: 87 mg/dL (ref 70–99)
Glucose-Capillary: 98 mg/dL (ref 70–99)
Glucose-Capillary: 99 mg/dL (ref 70–99)

## 2023-03-02 LAB — MAGNESIUM
Magnesium: 2 mg/dL (ref 1.7–2.4)
Magnesium: 2.1 mg/dL (ref 1.7–2.4)

## 2023-03-02 LAB — PHOSPHORUS
Phosphorus: 3.7 mg/dL (ref 2.5–4.6)
Phosphorus: 3.5 mg/dL (ref 2.5–4.6)

## 2023-03-02 MED ORDER — CHLORHEXIDINE GLUCONATE CLOTH 2 % EX PADS
6.0000 | MEDICATED_PAD | Freq: Every day | CUTANEOUS | Status: DC
  Administered 2023-03-02 – 2023-03-06 (×5): 6 via TOPICAL

## 2023-03-02 MED ORDER — LOSARTAN POTASSIUM 25 MG PO TABS
25.0000 mg | ORAL_TABLET | Freq: Every day | ORAL | Status: DC
  Administered 2023-03-02 – 2023-03-06 (×5): 25 mg
  Filled 2023-03-02 (×5): qty 1

## 2023-03-02 MED ORDER — SODIUM CHLORIDE 0.9 % IV SOLN
2.0000 g | Freq: Two times a day (BID) | INTRAVENOUS | Status: DC
  Administered 2023-03-02 – 2023-03-05 (×7): 2 g via INTRAVENOUS
  Filled 2023-03-02 (×7): qty 12.5

## 2023-03-02 NOTE — Progress Notes (Signed)
PHARMACY NOTE -  Cefepime  Pharmacy has been assisting with dosing of cefepime for UTI (including pseudomonas). Dosage remains stable at 2g IV q12 hr (non-sepsis dosing) and further renal adjustments per institutional Pharmacy antibiotic protocol  Pharmacy will sign off, following peripherally for culture results, dose adjustments, and length of therapy. Please reconsult if a change in clinical status warrants re-evaluation of dosage.  Bernadene Person, PharmD, BCPS [phone] 03/02/2023, 1:51 PM

## 2023-03-02 NOTE — TOC CM/SW Note (Signed)
Per chart review patient will return to nursing home at this time there are no TOC needs at this time.

## 2023-03-02 NOTE — Progress Notes (Addendum)
PROGRESS NOTE    Kathleen Jensen  BJY:[id number] DOB: [birth year] DOA: 02/28/2023 PCP: System, Provider Not In    Brief Narrative:   Kathleen Jensen is a 72 y.o. female with past medical history significant for CVA with residual expressive aphasia/right-sided hemiplegia, chronic atrial fibrillation not on anticoagulation, SSS s/p PPM, seizure disorder, HTN, hypothyroidism, anemia of chronic medical disease, adult failure to thrive on PEG tube feeding who presented to [facility] ED on 8/2 via EMS from [facility] with altered mental status.  Staff at facility concern for UTI.  Mental status not at baseline as well.  Apparently patient had been yelling uncomfortably and trying to pull out her G-tube.  Unable obtain any further ROS given her mental status complicated by her underlying aphasia.  In the ED, temperature 98.2 F, HR 59, RR 18, BP 202/93, SpO2 96% on room air.  WBC 7.6, hemoglobin 13.3, platelets 163.  Sodium 132, potassium 4.4, chloride 98, CO2 29, glucose 82, BUN 21, creatinine 0.52.  Alk phos 171, AST 61, ALT 25, total globin 0.6.  Lactic acid 1.7.  Urinalysis with large leukocytes, rare bacteria, greater than 50 WBCs.  CT head without contrast with diffuse cerebral atrophy and chronic ischemic changes, no acute intracranial process although limited study due to patient motion.  Chest x-ray with no focal airspace opacities.  Patient was started on peritoneal box.  TRH consulted for admission for further evaluation management of acute metabolic encephalopathy secondary to UTI.  Assessment & Plan:   Acute metabolic encephalopathy: Improving Patient presenting from a skilled nursing facility with worsening confusion that is off from her typical baseline per nursing staff.  Etiology likely secondary to urinary tract infection.  CT head without contrast unrevealing. --Continue treatment as below, supportive care  GNR Urinary tract infection Urinalysis with  large leukocytes, negative nitrite, greater than 50 WBCs. -- Urine culture: > 100K Ecoli, Pseudomonas, Providencia; await susceptibilities -- Escalate ceftriaxone to cefepime given urine culture findings -- Exchange Foley catheter today  Hx CVA with residual expressive aphasia/right-sided hemiplegia -- Continue aspirin and statin  Chronic atrial fibrillation -- Amiodarone 200 mg per tube daily -- Metoprolol tartrate 25 mg per tube twice daily -- Not on anticoagulation  SSS s/p PPM EKG with atrial paced rhythm  Seizure disorder -- Dilantin 75 mg per tube twice daily -- valproic acid 500 mg per tube twice daily; 750 mg p.o. nightly -- Keppra 1000 mg per tube daily  Essential hypertension -- Started on amlodipine 10 mg per tube daily given elevated blood pressure on admission -- Started on losartan 25 mg per tube daily for persistently elevated blood pressure today -- Metoprolol tartrate 25 mg per tube twice daily -- Hydralazine 25 mg per tube q6h PRN SBP >165 or DBP >110 -- Continue aspirin and statin  Chronic hyponatremia Baseline sodium 132-135, sodium on admission 132.  Restarting tube feeds. -- BMP in a.m.  Hypothyroidism TSH 4.987, free T4 1.04.  Currently not on thyroid replacement medication  GERD -- Pepcid 40 mg per tube daily  Adult failure to thrive IR performed PEG tube check, in place and functional.  Continue tube feeds per RD.   DVT prophylaxis: enoxaparin (LOVENOX) injection 40 mg Start: 03/01/23 1000 SCDs Start: 03/01/23 0231    Code Status: Full Code Family Communication:   Disposition Plan:  Level of care: Med-Surg Status is: Inpatient Remains inpatient appropriate because: IV antibiotics, anticipate discharge back to SNF in 2-3 days    Consultants:  None  Procedures:  IR tube check  Antimicrobials:  Ceftriaxone 8/2>>   Subjective: Patient seen examined at bedside, lying in bed.  Pleasantly confused/Aphasic but appears comfortable.  No  family present.  Unable to obtain any further ROS due to her underlying dysphagia.  No acute concerns overnight per nursing staff.  Objective: Vitals:   03/01/23 2154 03/01/23 2155 03/02/23 0422 03/02/23 0500  BP: (!) 165/94 (!) 160/90 (!) 163/86   Pulse: 76 72 60   Resp: 20  18   Temp: 98.6 F (37 C)  98.2 F (36.8 C)   TempSrc: Oral  Oral   SpO2: 100% 99% 96%   Weight:    90.7 kg  Height:        Intake/Output Summary (Last 24 hours) at 03/02/2023 1138 Last data filed at 03/02/2023 1038 Gross per 24 hour  Intake 430.34 ml  Output 1550 ml  Net -1119.66 ml   Filed Weights   02/28/23 1801 03/02/23 0500  Weight: 88 kg 90.7 kg    Examination:  Physical Exam: GEN: NAD, chronically ill-appearing, aphasic HEENT: NCAT, PERRL, EOMI, sclera clear, dry mucous membranes PULM: CTAB w/o wheezes/crackles, normal respiratory effort, on room air CV: RRR w/o M/G/R GI: abd soft, NTND, NABS, PEG tube noted in place GU: Foley catheter noted in place MSK: no peripheral edema    Data Reviewed: I have personally reviewed following labs and imaging studies  CBC: Recent Labs  Lab 02/28/23 2124 03/01/23 0500 03/01/23 1602  WBC 7.6 7.1 8.3  NEUTROABS 3.4  --   --   HGB 13.3 12.1 13.4  HCT 43.1 39.0 42.3  MCV 94.7 94.7 92.6  PLT 163 159 171   Basic Metabolic Panel: Recent Labs  Lab 02/28/23 2020 03/01/23 0500 03/01/23 1602 03/02/23 0516  NA 132* 136  --   --   K 4.4 3.3*  --   --   CL 98 109  --   --   CO2 29 23  --   --   GLUCOSE 82 75  --   --   BUN 21 17  --   --   CREATININE 0.52 0.34*  --   --   CALCIUM 8.2* 6.5*  --   --   MG  --   --  1.6* 2.0  PHOS  --   --  3.3 3.7   GFR: Estimated Creatinine Clearance: 70.7 mL/min (A) (by C-G formula based on SCr of 0.34 mg/dL (L)). Liver Function Tests: Recent Labs  Lab 02/28/23 2020 03/01/23 0500  AST 61* 44*  ALT 25 22  ALKPHOS 171* 108  BILITOT 0.6 0.4  PROT 9.1* 6.6  ALBUMIN 2.3* 1.6*   No results for input(s):  "LIPASE", "AMYLASE" in the last 168 hours. No results for input(s): "AMMONIA" in the last 168 hours. Coagulation Profile: No results for input(s): "INR", "PROTIME" in the last 168 hours. Cardiac Enzymes: No results for input(s): "CKTOTAL", "CKMB", "CKMBINDEX", "TROPONINI" in the last 168 hours. BNP (last 3 results) No results for input(s): "PROBNP" in the last 8760 hours. HbA1C: No results for input(s): "HGBA1C" in the last 72 hours. CBG: Recent Labs  Lab 03/01/23 2146 03/02/23 0030 03/02/23 0417 03/02/23 0742  GLUCAP 84 98 82 99   Lipid Profile: No results for input(s): "CHOL", "HDL", "LDLCALC", "TRIG", "CHOLHDL", "LDLDIRECT" in the last 72 hours. Thyroid Function Tests: Recent Labs    03/01/23 0500  TSH 4.987*  FREET4 1.04   Anemia Panel: No results for input(s): "VITAMINB12", "  FOLATE", "FERRITIN", "TIBC", "IRON", "RETICCTPCT" in the last 72 hours. Sepsis Labs: Recent Labs  Lab 02/28/23 2027  LATICACIDVEN 1.7    Recent Results (from the past 240 hour(s))  Urine Culture     Status: Abnormal (Preliminary result)   Collection Time: 02/28/23  7:45 PM   Specimen: Urine, Catheterized  Result Value Ref Range Status   Specimen Description   Final    URINE, CATHETERIZED Performed at [facility], [address], [place], [place] [address]    Special Requests   Final    NONE Performed at [facility], [address] [place], [place] [address]    Culture (A)  Final    >=100,000 COLONIES/mL GRAM NEGATIVE RODS CULTURE REINCUBATED FOR BETTER GROWTH IDENTIFICATION AND SUSCEPTIBILITIES TO FOLLOW Performed at [facility], [address], [place], [place] [address]    Report Status PENDING  Incomplete         Radiology Studies: DG ABDOMEN PEG TUBE LOCATION  Result Date: 03/01/2023 CLINICAL DATA:  J-tube placement. EXAM: ABDOMEN - 1 VIEW COMPARISON:  01/26/2023 FINDINGS: KUB obtained after injection of 30 cc Omnipaque  through the patient's J-tube. Jejunal loops in the central abdomen are opacified consistent with J-tube tip position in the jejunum. There is diffuse gaseous distention of bowel in the visualized abdomen, similar to prior. IMPRESSION: J-tube tip is in the jejunum. Electronically Signed   By: Kennith Center M.D.   On: 03/01/2023 14:40   CT Head Wo Contrast  Result Date: 02/28/2023 CLINICAL DATA:  Altered level of consciousness EXAM: CT HEAD WITHOUT CONTRAST TECHNIQUE: Contiguous axial images were obtained from the base of the skull through the vertex without intravenous contrast. RADIATION DOSE REDUCTION: This exam was performed according to the departmental dose-optimization program which includes automated exposure control, adjustment of the mA and/or kV according to patient size and/or use of iterative reconstruction technique. COMPARISON:  11/16/2022 FINDINGS: Evaluation is extremely limited by patient motion throughout the exam. Brain: Chronic encephalomalacia within the left parietal lobe. Diffuse cerebral atrophy unchanged. No evidence of acute infarct or hemorrhage. Ex vacuo dilatation of the lateral ventricles. Midline structures are unremarkable. No acute extra-axial fluid collections. No mass effect. Vascular: No hyperdense vessel or unexpected calcification. Skull: Normal. Negative for fracture or focal lesion. Sinuses/Orbits: No acute finding. Other: None. IMPRESSION: 1. Limited study due to patient motion. 2. Diffuse cerebral atrophy and chronic ischemic changes as above. No acute intracranial process. Electronically Signed   By: Sharlet Salina M.D.   On: 02/28/2023 22:35   DG Chest Portable 1 View  Result Date: 02/28/2023 CLINICAL DATA:  illness EXAM: PORTABLE CHEST 1 VIEW COMPARISON:  CXR 11/25/22 FINDINGS: Unchanged asymmetric elevation of the right hemidiaphragm. Left-sided dual lead cardiac device in place with unchanged lead positioning. No pleural effusion. No pneumothorax. Unchanged cardiac  and mediastinal contours. No focal airspace opacity. No radiographically apparent displaced rib fractures. Visualized upper abdomen is unremarkable. There are surgical clips in the right upper quadrant. Degenerative changes of the right glenohumeral joint. IMPRESSION: No focal airspace opacity Electronically Signed   By: Lorenza Cambridge M.D.   On: 02/28/2023 18:27        Scheduled Meds:  acetaminophen  640 mg Per Tube Q8H   amiodarone  200 mg Per Tube Daily   amLODipine  10 mg Per Tube Daily   aspirin  81 mg Oral Daily   Chlorhexidine Gluconate Cloth  6 each Topical Daily   cholecalciferol  1,000 Units  Per Tube QHS   cyclobenzaprine  2.5 mg Per Tube BID   enoxaparin (LOVENOX) injection  40 mg Subcutaneous Q24H   famotidine  40 mg Per Tube Daily   feeding supplement (OSMOLITE 1.2 CAL)  1,000 mL Per Tube Q24H   feeding supplement (PROSource TF20)  60 mL Per Tube Daily   folic acid  1 mg Per Tube Daily   levETIRAcetam  1,000 mg Per Tube BID   liver oil-zinc oxide   Topical TID   losartan  25 mg Per Tube Daily   metoprolol tartrate  25 mg Per Tube BID   nystatin   Topical Once   phenytoin  75 mg Per Tube BID   senna-docusate  2 tablet Per Tube BID   simethicone  80 mg Per Tube Daily   simvastatin  10 mg Per Tube QHS   sodium chloride flush  3 mL Intravenous Q12H   valproic acid  500 mg Per Tube BID   valproic acid  750 mg Oral QHS   zinc sulfate  220 mg Per Tube Daily   Continuous Infusions:  sodium chloride     cefTRIAXone (ROCEPHIN)  IV 1 g (03/01/23 2206)     LOS: 1 day    Time spent: 52 minutes spent on chart review, discussion with nursing staff, consultants, updating family and interview/physical exam; more than 50% of that time was spent in counseling and/or coordination of care.    Alvira Philips [place], DO [facility] Available via Epic secure chat 7am-7pm After these hours, please refer to coverage provider listed on [email or web address] 03/02/2023, 11:38 AM

## 2023-03-02 NOTE — Progress Notes (Signed)
Changed tube feed osmolite 1.2 and water bag

## 2023-03-02 NOTE — Progress Notes (Signed)
Increased rate from 40cc to 50cc/hr

## 2023-03-03 ENCOUNTER — Other Ambulatory Visit: Payer: Self-pay

## 2023-03-03 DIAGNOSIS — N3000 Acute cystitis without hematuria: Secondary | ICD-10-CM | POA: Diagnosis not present

## 2023-03-03 LAB — GLUCOSE, CAPILLARY
Glucose-Capillary: 114 mg/dL — ABNORMAL HIGH (ref 70–99)
Glucose-Capillary: 115 mg/dL — ABNORMAL HIGH (ref 70–99)
Glucose-Capillary: 133 mg/dL — ABNORMAL HIGH (ref 70–99)
Glucose-Capillary: 139 mg/dL — ABNORMAL HIGH (ref 70–99)
Glucose-Capillary: 83 mg/dL (ref 70–99)
Glucose-Capillary: 98 mg/dL (ref 70–99)

## 2023-03-03 MED ORDER — OSMOLITE 1.5 CAL PO LIQD
1170.0000 mL | ORAL | Status: DC
  Administered 2023-03-03 – 2023-03-05 (×3): 1170 mL
  Filled 2023-03-03 (×2): qty 1000
  Filled 2023-03-03 (×3): qty 1185
  Filled 2023-03-03: qty 1000

## 2023-03-03 MED ORDER — PROSOURCE TF20 ENFIT COMPATIBL EN LIQD
60.0000 mL | Freq: Two times a day (BID) | ENTERAL | Status: DC
  Administered 2023-03-03 – 2023-03-06 (×6): 60 mL
  Filled 2023-03-03 (×6): qty 60

## 2023-03-03 MED ORDER — VITAMIN C 500 MG PO TABS
500.0000 mg | ORAL_TABLET | Freq: Every day | ORAL | Status: DC
  Administered 2023-03-03 – 2023-03-06 (×4): 500 mg via ORAL
  Filled 2023-03-03 (×4): qty 1

## 2023-03-03 MED ORDER — OSMOLITE 1.2 CAL PO LIQD
1000.0000 mL | ORAL | Status: DC
  Administered 2023-03-03: 1000 mL

## 2023-03-03 MED ORDER — FREE WATER
150.0000 mL | Status: DC
  Administered 2023-03-03 – 2023-03-06 (×17): 150 mL

## 2023-03-03 MED ORDER — ADULT MULTIVITAMIN W/MINERALS CH
1.0000 | ORAL_TABLET | Freq: Every day | ORAL | Status: DC
  Administered 2023-03-03 – 2023-03-06 (×4): 1 via ORAL
  Filled 2023-03-03 (×4): qty 1

## 2023-03-03 NOTE — TOC Initial Note (Signed)
Transition of Care [facility]) - Initial/Assessment Note    Patient Details  Name: Kathleen Jensen MRN: [id number] Date of Birth: September 21, 1950  [facility] of [facility] CM/SW Contact:    Larrie Kass, LCSW Phone Number: 03/03/2023, 1:04 PM  Clinical Narrative:                 CSW confirmed pt is from [facility] as a LTC resident. TOC to follow for d/c needs.  Expected Discharge Plan: Long Term Nursing Home Barriers to Discharge: Continued Medical Work up   Patient Goals and CMS Choice            Expected Discharge Plan and Services                                              Prior Living Arrangements/Services                       Activities of Daily Living      Permission Sought/Granted                  Emotional Assessment              Admission diagnosis:  Acute cystitis [N30.00] Lower urinary tract infectious disease [N39.0] Delirium [R41.0] Patient Active Problem List   Diagnosis Date Noted   Acute cystitis 03/01/2023   Delirium secondary to UTI 03/01/2023   Atrial fibrillation, chronic (HCC) 03/01/2023   History of seizure 03/01/2023   Chronic hyponatremia 03/01/2023   Acute respiratory failure (HCC) 11/16/2022   Abdominal distention 11/16/2022   AMS (altered mental status) 05/17/2022   Altered mental status 05/15/2022   Sacral wound 05/15/2022   Intracranial hemorrhage (HCC)    Pressure injury of skin 02/18/2022   Chronic indwelling Foley catheter 02/18/2022   PAF (paroxysmal atrial fibrillation) (HCC) 02/18/2022   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HCC) 02/18/2022   Septic shock (HCC) 02/15/2022   Constipation    Abdominal pain    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   History of right hemiplegia (HCC) 04/12/2020   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   Hyperlipidemia associated with type 2 diabetes mellitus (HCC) 07/30/2018   History of CVA  (cerebrovascular accident) 07/30/2018   Vitamin B 12 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/30/2018   Asthma 03/13/2018   Seizures (HCC) 01/12/2018   CVA (cerebral vascular accident) (HCC) 12/08/2017   Abnormal thyroid blood test 12/08/2017   Pulmonary embolism and infarction (HCC) 12/08/2017   Chronic anemia 12/08/2017   History of expressive aphasia 10/15/2017   Seizure disorder (HCC) 10/15/2017   Dysphagia 10/15/2017   Hyperlipidemia 09/22/2017   Urinary frequency 09/22/2017   Airway hyperreactivity 01/26/2016   Disease of thyroid gland 01/26/2016   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   Other specified postprocedural states 01/26/2016   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   Chronic LBP 04/10/2015   Degeneration of intervertebral disc of lumbar region 04/10/2015   Spondylolisthesis of lumbar region 04/10/2015   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   History of cardiac pacemaker in situ 01/09/2015   B12 DEFICIENCY 08/15/2008   UNSPECIFIED VITAMIN D DEFICIENCY 08/15/2008   Sick sinus syndrome (HCC) 08/15/2008   OBSTRUCTIVE SLEEP APNEA 08/11/2008   INSOMNIA 04/28/2008   Hyperlipidemia LDL goal <70 12/12/2006   RHINITIS, ALLERGIC NEC 12/12/2006   Hypothyroidism 12/11/2006   Essential hypertension 12/11/2006  GERD 12/11/2006   STRESS INCONTINENCE 12/11/2006   PCP:  System, Provider Not In Pharmacy:   [facility] - [place], [place] - [address] [place] [address] Phone: [phone] Fax: [phone]  [facility] [address] [place] [address] Phone: [phone] Fax: [phone]  [facility] [address] [place] [address] Phone: [phone] Fax: [phone]     Social Determinants of Health (SDOH) Social History: SDOH Screenings   Food Insecurity: Patient Unable To Answer (03/01/2023)  Housing: High Risk (03/01/2023)   Transportation Needs: Patient Unable To Answer (03/01/2023)  Utilities: Not At Risk (05/17/2022)  Depression (PHQ2-9): Low Risk  (05/22/2019)  Tobacco Use: Medium Risk (03/01/2023)   SDOH Interventions:     Readmission Risk Interventions    02/18/2022    2:23 PM  Readmission Risk Prevention Plan  Transportation Screening Complete  PCP or Specialist Appt within 5-7 Days Complete  Home Care Screening Complete  Medication Review (RN CM) Complete

## 2023-03-03 NOTE — Plan of Care (Signed)
  Problem: Elimination: Goal: Will not experience complications related to bowel motility Outcome: Progressing   Problem: Safety: Goal: Ability to remain free from injury will improve Outcome: Progressing   

## 2023-03-03 NOTE — Progress Notes (Signed)
PROGRESS NOTE    Kathleen Jensen  AVW:[id number] DOB: 1951/03/25 DOA: 02/28/2023 PCP: System, Provider Not In    Brief Narrative:   Kathleen Jensen is a 72 y.o. female with past medical history significant for CVA with residual expressive aphasia/right-sided hemiplegia, chronic atrial fibrillation not on anticoagulation, SSS s/p PPM, seizure disorder, HTN, hypothyroidism, anemia of chronic medical disease, adult failure to thrive on PEG tube feeding who presented to [facility] ED on 8/2 via EMS from [facility] with altered mental status.  Staff at facility concern for UTI.  Mental status not at baseline as well.  Apparently patient had been yelling uncomfortably and trying to pull out her G-tube.  Unable obtain any further ROS given her mental status complicated by her underlying aphasia.  In the ED, temperature 98.2 F, HR 59, RR 18, BP 202/93, SpO2 96% on room air.  WBC 7.6, hemoglobin 13.3, platelets 163.  Sodium 132, potassium 4.4, chloride 98, CO2 29, glucose 82, BUN 21, creatinine 0.52.  Alk phos 171, AST 61, ALT 25, total globin 0.6.  Lactic acid 1.7.  Urinalysis with large leukocytes, rare bacteria, greater than 50 WBCs.  CT head without contrast with diffuse cerebral atrophy and chronic ischemic changes, no acute intracranial process although limited study due to patient motion.  Chest x-ray with no focal airspace opacities.  Patient was started on peritoneal box.  TRH consulted for admission for further evaluation management of acute metabolic encephalopathy secondary to UTI.  Assessment & Plan:   Acute metabolic encephalopathy: Improving Patient presenting from a skilled nursing facility with worsening confusion that is off from her typical baseline per nursing staff.  Etiology likely secondary to urinary tract infection.  CT head without contrast unrevealing. --Continue treatment as below, supportive care  GNR Urinary tract infection Urinalysis with  large leukocytes, negative nitrite, greater than 50 WBCs.  Foley catheter exchanged on 8/4. -- Urine culture: > 100K Ecoli, Pseudomonas, Providencia -- Cefepime 2 g IV every 12 hours  Hx CVA with residual expressive aphasia/right-sided hemiplegia -- Continue aspirin and statin  Chronic atrial fibrillation -- Amiodarone 200 mg per tube daily -- Metoprolol tartrate 25 mg per tube twice daily -- Not on anticoagulation  SSS s/p PPM EKG with atrial paced rhythm  Seizure disorder -- Dilantin 75 mg per tube twice daily -- valproic acid 500 mg per tube twice daily; 750 mg p.o. nightly -- Keppra 1000 mg per tube daily  Essential hypertension -- Started on amlodipine 10 mg per tube daily given elevated blood pressure on admission -- Started on losartan 25 mg per tube daily for persistently elevated blood pressure today -- Metoprolol tartrate 25 mg per tube twice daily -- Hydralazine 25 mg per tube q6h PRN SBP >165 or DBP >110 -- Continue aspirin and statin  Chronic hyponatremia Baseline sodium 132-135, sodium on admission 132.  Restarting tube feeds. -- BMP in a.m.  Hypothyroidism TSH 4.987, free T4 1.04.  Currently not on thyroid replacement medication  GERD -- Pepcid 40 mg per tube daily  Adult failure to thrive IR performed PEG tube check, in place and functional.  Continue tube feeds per RD.   DVT prophylaxis: enoxaparin (LOVENOX) injection 40 mg Start: 03/01/23 1000 SCDs Start: 03/01/23 0231    Code Status: Full Code Family Communication:   Disposition Plan:  Level of care: Med-Surg Status is: Inpatient Remains inpatient appropriate because: IV antibiotics, anticipate discharge back to SNF     Consultants:  None  Procedures:  IR tube check  Antimicrobials:  Ceftriaxone 8/2 - 8/4 Cefepime 8/4>>   Subjective: Patient seen examined at bedside, lying in bed.  Pleasantly confused, mumbling/aphasic but appears comfortable.  No family present.  Unable to obtain  any further ROS.  Remains on IV antibiotics.  No acute concerns overnight per nursing staff.  Objective: Vitals:   03/02/23 1155 03/02/23 2103 03/03/23 0430 03/03/23 1004  BP: (!) 167/86 (!) 159/87 (!) 142/71 126/83  Pulse: 60 63 60   Resp:  18 19 20   Temp:  97.6 F (36.4 C) 97.9 F (36.6 C) 97.9 F (36.6 C)  TempSrc:  Axillary  Oral  SpO2: 94% 100% 97% 98%  Weight:   90.6 kg   Height:        Intake/Output Summary (Last 24 hours) at 03/03/2023 1103 Last data filed at 03/03/2023 6045 Gross per 24 hour  Intake 954.67 ml  Output 800 ml  Net 154.67 ml   Filed Weights   02/28/23 1801 03/02/23 0500 03/03/23 0430  Weight: 88 kg 90.7 kg 90.6 kg    Examination:  Physical Exam: GEN: NAD, chronically ill-appearing, aphasic HEENT: NCAT, PERRL, EOMI, sclera clear, dry mucous membranes PULM: CTAB w/o wheezes/crackles, normal respiratory effort, on room air CV: RRR w/o M/G/R GI: abd soft, NTND, NABS, PEG tube noted in place GU: Foley catheter noted in place MSK: no peripheral edema    Data Reviewed: I have personally reviewed following labs and imaging studies  CBC: Recent Labs  Lab 02/28/23 2124 03/01/23 0500 03/01/23 1602  WBC 7.6 7.1 8.3  NEUTROABS 3.4  --   --   HGB 13.3 12.1 13.4  HCT 43.1 39.0 42.3  MCV 94.7 94.7 92.6  PLT 163 159 171   Basic Metabolic Panel: Recent Labs  Lab 02/28/23 2020 03/01/23 0500 03/01/23 1602 03/02/23 0516 03/02/23 1657 03/03/23 0455  NA 132* 136  --   --   --  133*  K 4.4 3.3*  --   --   --  3.6  CL 98 109  --   --   --  103  CO2 29 23  --   --   --  25  GLUCOSE 82 75  --   --   --  112*  BUN 21 17  --   --   --  24*  CREATININE 0.52 0.34*  --   --   --  0.46  CALCIUM 8.2* 6.5*  --   --   --  7.9*  MG  --   --  1.6* 2.0 2.1 1.9  PHOS  --   --  3.3 3.7 3.5 3.1   GFR: Estimated Creatinine Clearance: 70.6 mL/min (by C-G formula based on SCr of 0.46 mg/dL). Liver Function Tests: Recent Labs  Lab 02/28/23 2020 03/01/23 0500   AST 61* 44*  ALT 25 22  ALKPHOS 171* 108  BILITOT 0.6 0.4  PROT 9.1* 6.6  ALBUMIN 2.3* 1.6*   No results for input(s): "LIPASE", "AMYLASE" in the last 168 hours. No results for input(s): "AMMONIA" in the last 168 hours. Coagulation Profile: No results for input(s): "INR", "PROTIME" in the last 168 hours. Cardiac Enzymes: No results for input(s): "CKTOTAL", "CKMB", "CKMBINDEX", "TROPONINI" in the last 168 hours. BNP (last 3 results) No results for input(s): "PROBNP" in the last 8760 hours. HbA1C: No results for input(s): "HGBA1C" in the last 72 hours. CBG: Recent Labs  Lab 03/02/23 1607 03/02/23 2059 03/03/23 0001 03/03/23 0428 03/03/23 0750  GLUCAP  101* 87 83 98 114*   Lipid Profile: No results for input(s): "CHOL", "HDL", "LDLCALC", "TRIG", "CHOLHDL", "LDLDIRECT" in the last 72 hours. Thyroid Function Tests: Recent Labs    03/01/23 0500  TSH 4.987*  FREET4 1.04   Anemia Panel: No results for input(s): "VITAMINB12", "FOLATE", "FERRITIN", "TIBC", "IRON", "RETICCTPCT" in the last 72 hours. Sepsis Labs: Recent Labs  Lab 02/28/23 2027  LATICACIDVEN 1.7    Recent Results (from the past 240 hour(s))  Urine Culture     Status: Abnormal   Collection Time: 02/28/23  7:45 PM   Specimen: Urine, Catheterized  Result Value Ref Range Status   Specimen Description   Final    URINE, CATHETERIZED Performed at [facility], [address], [place], [place] [address]    Special Requests   Final    NONE Performed at [facility], [address] [place], [place] [address]    Culture (A)  Final    >=100,000 COLONIES/mL ESCHERICHIA COLI >=100,000 COLONIES/mL PROVIDENCIA STUARTII >=100,000 COLONIES/mL PSEUDOMONAS AERUGINOSA    Report Status 03/03/2023 FINAL  Final   Organism ID, Bacteria ESCHERICHIA COLI (A)  Final   Organism ID, Bacteria PROVIDENCIA STUARTII (A)  Final   Organism ID, Bacteria PSEUDOMONAS AERUGINOSA (A)  Final       Susceptibility   Escherichia coli - MIC*    AMPICILLIN <=2 SENSITIVE Sensitive     CEFAZOLIN <=4 SENSITIVE Sensitive     CEFEPIME <=0.12 SENSITIVE Sensitive     CEFTRIAXONE <=0.25 SENSITIVE Sensitive     CIPROFLOXACIN <=0.25 SENSITIVE Sensitive     GENTAMICIN <=1 SENSITIVE Sensitive     IMIPENEM <=0.25 SENSITIVE Sensitive     NITROFURANTOIN 32 SENSITIVE Sensitive     TRIMETH/SULFA <=20 SENSITIVE Sensitive     AMPICILLIN/SULBACTAM <=2 SENSITIVE Sensitive     PIP/TAZO <=4 SENSITIVE Sensitive     * >=100,000 COLONIES/mL ESCHERICHIA COLI   Pseudomonas aeruginosa - MIC*    CEFTAZIDIME 2 SENSITIVE Sensitive     CIPROFLOXACIN <=0.25 SENSITIVE Sensitive     GENTAMICIN 2 SENSITIVE Sensitive     IMIPENEM 2 SENSITIVE Sensitive     PIP/TAZO 8 SENSITIVE Sensitive     CEFEPIME 2 SENSITIVE Sensitive     * >=100,000 COLONIES/mL PSEUDOMONAS AERUGINOSA   Providencia stuartii - MIC*    AMPICILLIN >=32 RESISTANT Resistant     CEFEPIME <=0.12 SENSITIVE Sensitive     CEFTRIAXONE <=0.25 SENSITIVE Sensitive     CIPROFLOXACIN >=4 RESISTANT Resistant     GENTAMICIN RESISTANT Resistant     IMIPENEM 1 SENSITIVE Sensitive     NITROFURANTOIN 256 RESISTANT Resistant     TRIMETH/SULFA <=20 SENSITIVE Sensitive     AMPICILLIN/SULBACTAM >=32 RESISTANT Resistant     PIP/TAZO <=4 SENSITIVE Sensitive     * >=100,000 COLONIES/mL PROVIDENCIA STUARTII         Radiology Studies: DG ABDOMEN PEG TUBE LOCATION  Result Date: 03/01/2023 CLINICAL DATA:  J-tube placement. EXAM: ABDOMEN - 1 VIEW COMPARISON:  01/26/2023 FINDINGS: KUB obtained after injection of 30 cc Omnipaque through the patient's J-tube. Jejunal loops in the central abdomen are opacified consistent with J-tube tip position in the jejunum. There is diffuse gaseous distention of bowel in the visualized abdomen, similar to prior. IMPRESSION: J-tube tip is in the jejunum. Electronically Signed   By: Kennith Center M.D.   On: 03/01/2023 14:40         Scheduled Meds:  acetaminophen  640 mg Per Tube Q8H   amiodarone  200 mg Per Tube Daily   amLODipine  10 mg Per Tube Daily   aspirin  81 mg Oral Daily   Chlorhexidine Gluconate Cloth  6 each Topical Daily   cholecalciferol  1,000 Units Per Tube QHS   cyclobenzaprine  2.5 mg Per Tube BID   enoxaparin (LOVENOX) injection  40 mg Subcutaneous Q24H   famotidine  40 mg Per Tube Daily   feeding supplement (OSMOLITE 1.2 CAL)  1,000 mL Per Tube Q24H   feeding supplement (PROSource TF20)  60 mL Per Tube Daily   folic acid  1 mg Per Tube Daily   levETIRAcetam  1,000 mg Per Tube BID   liver oil-zinc oxide   Topical TID   losartan  25 mg Per Tube Daily   metoprolol tartrate  25 mg Per Tube BID   nystatin   Topical Once   phenytoin  75 mg Per Tube BID   senna-docusate  2 tablet Per Tube BID   simethicone  80 mg Per Tube Daily   simvastatin  10 mg Per Tube QHS   sodium chloride flush  3 mL Intravenous Q12H   valproic acid  500 mg Per Tube BID   valproic acid  750 mg Oral QHS   zinc sulfate  220 mg Per Tube Daily   Continuous Infusions:  sodium chloride     ceFEPime (MAXIPIME) IV 2 g (03/03/23 1022)     LOS: 2 days    Time spent: 52 minutes spent on chart review, discussion with nursing staff, consultants, updating family and interview/physical exam; more than 50% of that time was spent in counseling and/or coordination of care.    Alvira Philips [place], DO [facility] Available via Epic secure chat 7am-7pm After these hours, please refer to coverage provider listed on [email or web address] 03/03/2023, 11:03 AM

## 2023-03-03 NOTE — Progress Notes (Signed)
Nutrition Follow-up  DOCUMENTATION CODES:   Obesity unspecified  INTERVENTION:  - Adjust to new TF regimen. Osmolite 1.5 at 65 ml/h x18 hours, holding 1 hour before and 2 hours after each Dilantin administration Prosource TF20 60 ml BID +150mL Q4H free water flushes Provides 1915 kcal, 113 gm protein, (from TF and FWF) free water daily   - Monitor magnesium, potassium, and phosphorus, MD to replete as needed.  - Multivitamin with minerals daily, 500mg  vitamin C, and 220mg  zinc x14 days to support wound healing.   - Daily weights.   NUTRITION DIAGNOSIS:   Inadequate oral intake related to inability to eat as evidenced by NPO status. *ongoing  GOAL:   Patient will meet greater than or equal to 90% of their needs *met with TF  MONITOR:   Labs, Weight trends, TF tolerance  REASON FOR ASSESSMENT:   Consult Enteral/tube feeding initiation and management  ASSESSMENT:   72 y.o. female with PMH of right-sided hemiplegia and expressive aphasia secondary to stroke in 2018, chronic atrial fibrillation, sick sinus syndrome on pacemaker, seizure, essential HTN, hypothyroidism, chronic anemia, gastric bypass in 2008, and J-tube dependent who presented from nursing home due to worsening delirium and concern for UTI. Admitted with acute cystitis.  Patient in bed at time of visit, TF infusing at goal of 41mL/hr.  History limited as patient aphasic but she denies any concerns.   Of note, Dilantin BID now ordered for patient. This medication requires TF to be held 1 hour before and 2 hours after each Dilantin administration. Therefore, will adjust tube feeds to a run time of 18 hours (holding for a total of 6 hours for two Dilantin doses) and change to Osmolite 1.5 to keep volume down.  Change discussed with MD and RN.   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 Will add multivitamin with minerals, vitamin C, and end date for zinc of 14 days for  wound healing.    Medications reviewed and include: 1000 units vitamin D, 1mg  folic acid, 220mg  zinc, Dilantin BID, Senokot  Labs reviewed:  Na 133   Diet Order:   Diet Order             Diet NPO time specified  Diet effective now                   EDUCATION NEEDS:  Not appropriate for education at this time  Skin:  Skin Integrity Issues:: Stage IV, Stage II Stage II: Left posterior thigh Stage IV: Coccyx  Last BM:  8/4  Height:  Ht Readings from Last 1 Encounters:  02/28/23 5\' 5"  (1.651 m)   Weight:  Wt Readings from Last 1 Encounters:  03/03/23 90.6 kg   Ideal Body Weight:  56.82 kg  BMI:  Body mass index is 33.24 kg/m.  Estimated Nutritional Needs:  Kcal:  1800-2000 kcals Protein:  90-105 grams Fluid:  >/= 1.8L    Shelle Iron RD, LDN For contact information, refer to [facility].

## 2023-03-04 DIAGNOSIS — N3000 Acute cystitis without hematuria: Secondary | ICD-10-CM | POA: Diagnosis not present

## 2023-03-04 LAB — GLUCOSE, CAPILLARY
Glucose-Capillary: 106 mg/dL — ABNORMAL HIGH (ref 70–99)
Glucose-Capillary: 110 mg/dL — ABNORMAL HIGH (ref 70–99)
Glucose-Capillary: 117 mg/dL — ABNORMAL HIGH (ref 70–99)
Glucose-Capillary: 144 mg/dL — ABNORMAL HIGH (ref 70–99)
Glucose-Capillary: 85 mg/dL (ref 70–99)
Glucose-Capillary: 95 mg/dL (ref 70–99)

## 2023-03-04 NOTE — Plan of Care (Signed)
  Problem: Clinical Measurements: Goal: Diagnostic test results will improve Outcome: Progressing Goal: Respiratory complications will improve Outcome: Progressing   Problem: Safety: Goal: Ability to remain free from injury will improve Outcome: Progressing   

## 2023-03-04 NOTE — Plan of Care (Signed)
  Problem: Coping: Goal: Level of anxiety will decrease Outcome: Progressing   Problem: Pain Managment: Goal: General experience of comfort will improve Outcome: Progressing   

## 2023-03-04 NOTE — Progress Notes (Signed)
PROGRESS NOTE    Kathleen Jensen  WJX:[id number] DOB: 1950-12-07 DOA: 02/28/2023 PCP: System, Provider Not In    Brief Narrative:   Kathleen Jensen is a 72 y.o. female with past medical history significant for CVA with residual expressive aphasia/right-sided hemiplegia, chronic atrial fibrillation not on anticoagulation, SSS s/p PPM, seizure disorder, HTN, hypothyroidism, anemia of chronic medical disease, adult failure to thrive on PEG tube feeding who presented to [facility] ED on 8/2 via EMS from [facility] with altered mental status.  Staff at facility concern for UTI.  Mental status not at baseline as well.  Apparently patient had been yelling uncomfortably and trying to pull out her G-tube.  Unable obtain any further ROS given her mental status complicated by her underlying aphasia.  In the ED, temperature 98.2 F, HR 59, RR 18, BP 202/93, SpO2 96% on room air.  WBC 7.6, hemoglobin 13.3, platelets 163.  Sodium 132, potassium 4.4, chloride 98, CO2 29, glucose 82, BUN 21, creatinine 0.52.  Alk phos 171, AST 61, ALT 25, total globin 0.6.  Lactic acid 1.7.  Urinalysis with large leukocytes, rare bacteria, greater than 50 WBCs.  CT head without contrast with diffuse cerebral atrophy and chronic ischemic changes, no acute intracranial process although limited study due to patient motion.  Chest x-ray with no focal airspace opacities.  Patient was started on peritoneal box.  TRH consulted for admission for further evaluation management of acute metabolic encephalopathy secondary to UTI.  Assessment & Plan:   Acute metabolic encephalopathy: Improving Patient presenting from a skilled nursing facility with worsening confusion that is off from her typical baseline per nursing staff.  Etiology likely secondary to urinary tract infection.  CT head without contrast unrevealing. --Continue treatment as below, supportive care  GNR Urinary tract infection Urinalysis with  large leukocytes, negative nitrite, greater than 50 WBCs.  Foley catheter exchanged on 8/4. -- Urine culture: > 100K Ecoli, Pseudomonas, Providencia -- Cefepime 2 g IV every 12 hours  Hx CVA with residual expressive aphasia/right-sided hemiplegia -- Continue aspirin and statin  Chronic atrial fibrillation -- Amiodarone 200 mg per tube daily -- Metoprolol tartrate 25 mg per tube twice daily -- Not on anticoagulation  SSS s/p PPM EKG with atrial paced rhythm  Seizure disorder -- Dilantin 75 mg per tube twice daily -- valproic acid 500 mg per tube twice daily; 750 mg p.o. nightly -- Keppra 1000 mg per tube daily  Essential hypertension -- Started on amlodipine 10 mg per tube daily given elevated blood pressure on admission -- Started on losartan 25 mg per tube daily for persistently elevated blood pressure today -- Metoprolol tartrate 25 mg per tube twice daily -- Hydralazine 25 mg per tube q6h PRN SBP >165 or DBP >110 -- Continue aspirin and statin  Chronic hyponatremia Baseline sodium 132-135, sodium on admission 132.  Restarting tube feeds. -- BMP in a.m.  Hypothyroidism TSH 4.987, free T4 1.04.  Currently not on thyroid replacement medication  GERD -- Pepcid 40 mg per tube daily  Adult failure to thrive IR performed PEG tube check, in place and functional.  Continue tube feeds per RD.   DVT prophylaxis: enoxaparin (LOVENOX) injection 40 mg Start: 03/01/23 1000 SCDs Start: 03/01/23 0231    Code Status: Full Code Family Communication:   Disposition Plan:  Level of care: Med-Surg Status is: Inpatient Remains inpatient appropriate because: IV antibiotics, anticipate discharge back to SNF     Consultants:  None  Procedures:  IR tube check  Antimicrobials:  Ceftriaxone 8/2 - 8/4 Cefepime 8/4>>   Subjective: Patient seen examined at bedside, lying in bed.  Pleasantly confused, mumbling/aphasic but appears comfortable.  No family present.  Unable to obtain  any further ROS.  Remains on IV antibiotics.  No acute concerns overnight per nursing staff.  Objective: Vitals:   03/03/23 1246 03/03/23 2116 03/04/23 0438 03/04/23 0500  BP: 121/62 134/71 137/85   Pulse: 65 69 79   Resp: 18 17 16    Temp: 97.6 F (36.4 C) 98.4 F (36.9 C) 98.8 F (37.1 C)   TempSrc: Axillary Oral Oral   SpO2: 96% 98% 97%   Weight:    94.5 kg  Height:        Intake/Output Summary (Last 24 hours) at 03/04/2023 1254 Last data filed at 03/04/2023 1200 Gross per 24 hour  Intake 2472.92 ml  Output 801 ml  Net 1671.92 ml   Filed Weights   03/02/23 0500 03/03/23 0430 03/04/23 0500  Weight: 90.7 kg 90.6 kg 94.5 kg    Examination:  Physical Exam: GEN: NAD, chronically ill-appearing, aphasic HEENT: NCAT, PERRL, EOMI, sclera clear, dry mucous membranes PULM: CTAB w/o wheezes/crackles, normal respiratory effort, on room air CV: RRR w/o M/G/R GI: abd soft, NTND, NABS, PEG tube noted in place GU: Foley catheter noted in place MSK: no peripheral edema    Data Reviewed: I have personally reviewed following labs and imaging studies  CBC: Recent Labs  Lab 02/28/23 2124 03/01/23 0500 03/01/23 1602  WBC 7.6 7.1 8.3  NEUTROABS 3.4  --   --   HGB 13.3 12.1 13.4  HCT 43.1 39.0 42.3  MCV 94.7 94.7 92.6  PLT 163 159 171   Basic Metabolic Panel: Recent Labs  Lab 02/28/23 2020 03/01/23 0500 03/01/23 1602 03/02/23 0516 03/02/23 1657 03/03/23 0455 03/04/23 0348  NA 132* 136  --   --   --  133* 135  K 4.4 3.3*  --   --   --  3.6 3.9  CL 98 109  --   --   --  103 105  CO2 29 23  --   --   --  25 24  GLUCOSE 82 75  --   --   --  112* 97  BUN 21 17  --   --   --  24* 29*  CREATININE 0.52 0.34*  --   --   --  0.46 0.56  CALCIUM 8.2* 6.5*  --   --   --  7.9* 8.1*  MG  --   --  1.6* 2.0 2.1 1.9  --   PHOS  --   --  3.3 3.7 3.5 3.1  --    GFR: Estimated Creatinine Clearance: 72.3 mL/min (by C-G formula based on SCr of 0.56 mg/dL). Liver Function Tests: Recent  Labs  Lab 02/28/23 2020 03/01/23 0500  AST 61* 44*  ALT 25 22  ALKPHOS 171* 108  BILITOT 0.6 0.4  PROT 9.1* 6.6  ALBUMIN 2.3* 1.6*   No results for input(s): "LIPASE", "AMYLASE" in the last 168 hours. No results for input(s): "AMMONIA" in the last 168 hours. Coagulation Profile: No results for input(s): "INR", "PROTIME" in the last 168 hours. Cardiac Enzymes: No results for input(s): "CKTOTAL", "CKMB", "CKMBINDEX", "TROPONINI" in the last 168 hours. BNP (last 3 results) No results for input(s): "PROBNP" in the last 8760 hours. HbA1C: No results for input(s): "HGBA1C" in the last 72 hours. CBG: Recent Labs  Lab 03/03/23 2113 03/04/23 0010 03/04/23 0436 03/04/23 0754 03/04/23 1222  GLUCAP 139* 95 106* 117* 85   Lipid Profile: No results for input(s): "CHOL", "HDL", "LDLCALC", "TRIG", "CHOLHDL", "LDLDIRECT" in the last 72 hours. Thyroid Function Tests: No results for input(s): "TSH", "T4TOTAL", "FREET4", "T3FREE", "THYROIDAB" in the last 72 hours.  Anemia Panel: No results for input(s): "VITAMINB12", "FOLATE", "FERRITIN", "TIBC", "IRON", "RETICCTPCT" in the last 72 hours. Sepsis Labs: Recent Labs  Lab 02/28/23 2027  LATICACIDVEN 1.7    Recent Results (from the past 240 hour(s))  Urine Culture     Status: Abnormal   Collection Time: 02/28/23  7:45 PM   Specimen: Urine, Catheterized  Result Value Ref Range Status   Specimen Description   Final    URINE, CATHETERIZED Performed at [facility], [address], [place], [place] [address]    Special Requests   Final    NONE Performed at [facility], [address] [place], [place] [address]    Culture (A)  Final    >=100,000 COLONIES/mL ESCHERICHIA COLI >=100,000 COLONIES/mL PROVIDENCIA STUARTII >=100,000 COLONIES/mL PSEUDOMONAS AERUGINOSA    Report Status 03/03/2023 FINAL  Final   Organism ID, Bacteria ESCHERICHIA COLI (A)  Final   Organism ID, Bacteria PROVIDENCIA  STUARTII (A)  Final   Organism ID, Bacteria PSEUDOMONAS AERUGINOSA (A)  Final      Susceptibility   Escherichia coli - MIC*    AMPICILLIN <=2 SENSITIVE Sensitive     CEFAZOLIN <=4 SENSITIVE Sensitive     CEFEPIME <=0.12 SENSITIVE Sensitive     CEFTRIAXONE <=0.25 SENSITIVE Sensitive     CIPROFLOXACIN <=0.25 SENSITIVE Sensitive     GENTAMICIN <=1 SENSITIVE Sensitive     IMIPENEM <=0.25 SENSITIVE Sensitive     NITROFURANTOIN 32 SENSITIVE Sensitive     TRIMETH/SULFA <=20 SENSITIVE Sensitive     AMPICILLIN/SULBACTAM <=2 SENSITIVE Sensitive     PIP/TAZO <=4 SENSITIVE Sensitive     * >=100,000 COLONIES/mL ESCHERICHIA COLI   Pseudomonas aeruginosa - MIC*    CEFTAZIDIME 2 SENSITIVE Sensitive     CIPROFLOXACIN <=0.25 SENSITIVE Sensitive     GENTAMICIN 2 SENSITIVE Sensitive     IMIPENEM 2 SENSITIVE Sensitive     PIP/TAZO 8 SENSITIVE Sensitive     CEFEPIME 2 SENSITIVE Sensitive     * >=100,000 COLONIES/mL PSEUDOMONAS AERUGINOSA   Providencia stuartii - MIC*    AMPICILLIN >=32 RESISTANT Resistant     CEFEPIME <=0.12 SENSITIVE Sensitive     CEFTRIAXONE <=0.25 SENSITIVE Sensitive     CIPROFLOXACIN >=4 RESISTANT Resistant     GENTAMICIN RESISTANT Resistant     IMIPENEM 1 SENSITIVE Sensitive     NITROFURANTOIN 256 RESISTANT Resistant     TRIMETH/SULFA <=20 SENSITIVE Sensitive     AMPICILLIN/SULBACTAM >=32 RESISTANT Resistant     PIP/TAZO <=4 SENSITIVE Sensitive     * >=100,000 COLONIES/mL PROVIDENCIA STUARTII         Radiology Studies: No results found.      Scheduled Meds:  acetaminophen  640 mg Per Tube Q8H   amiodarone  200 mg Per Tube Daily   amLODipine  10 mg Per Tube Daily   ascorbic acid  500 mg Oral Daily   aspirin  81 mg Oral Daily   Chlorhexidine Gluconate Cloth  6 each Topical Daily   cholecalciferol  1,000 Units Per Tube QHS   cyclobenzaprine  2.5 mg Per Tube BID   enoxaparin (LOVENOX) injection  40 mg Subcutaneous Q24H   famotidine  40 mg Per Tube Daily    feeding supplement (PROSource TF20)  60 mL Per Tube BID   folic acid  1 mg Per Tube Daily   free water  150 mL Per Tube Q4H   levETIRAcetam  1,000 mg Per Tube BID   liver oil-zinc oxide   Topical TID   losartan  25 mg Per Tube Daily   metoprolol tartrate  25 mg Per Tube BID   multivitamin with minerals  1 tablet Oral Daily   nystatin   Topical Once   phenytoin  75 mg Per Tube BID   senna-docusate  2 tablet Per Tube BID   simethicone  80 mg Per Tube Daily   simvastatin  10 mg Per Tube QHS   sodium chloride flush  3 mL Intravenous Q12H   valproic acid  500 mg Per Tube BID   valproic acid  750 mg Oral QHS   zinc sulfate  220 mg Per Tube Daily   Continuous Infusions:  sodium chloride     ceFEPime (MAXIPIME) IV 2 g (03/04/23 0935)   feeding supplement (OSMOLITE 1.5 CAL) 65 mL/hr at 03/04/23 1148     LOS: 3 days    Time spent: 52 minutes spent on chart review, discussion with nursing staff, consultants, updating family and interview/physical exam; more than 50% of that time was spent in counseling and/or coordination of care.    Alvira Philips [place], DO [facility] Available via Epic secure chat 7am-7pm After these hours, please refer to coverage provider listed on [email or web address] 03/04/2023, 12:54 PM

## 2023-03-05 ENCOUNTER — Other Ambulatory Visit: Payer: Self-pay

## 2023-03-05 DIAGNOSIS — N3000 Acute cystitis without hematuria: Secondary | ICD-10-CM | POA: Diagnosis not present

## 2023-03-05 LAB — GLUCOSE, CAPILLARY
Glucose-Capillary: 100 mg/dL — ABNORMAL HIGH (ref 70–99)
Glucose-Capillary: 111 mg/dL — ABNORMAL HIGH (ref 70–99)
Glucose-Capillary: 111 mg/dL — ABNORMAL HIGH (ref 70–99)
Glucose-Capillary: 126 mg/dL — ABNORMAL HIGH (ref 70–99)
Glucose-Capillary: 142 mg/dL — ABNORMAL HIGH (ref 70–99)
Glucose-Capillary: 82 mg/dL (ref 70–99)
Glucose-Capillary: 84 mg/dL (ref 70–99)

## 2023-03-05 MED ORDER — SULFAMETHOXAZOLE-TRIMETHOPRIM 800-160 MG PO TABS
1.0000 | ORAL_TABLET | Freq: Two times a day (BID) | ORAL | Status: DC
  Administered 2023-03-05 – 2023-03-06 (×3): 1 via ORAL
  Filled 2023-03-05 (×3): qty 1

## 2023-03-05 NOTE — Plan of Care (Signed)
  Problem: Education: Goal: Knowledge of General Education information will improve Description: Including pain rating scale, medication(s)/side effects and non-pharmacologic comfort measures Outcome: Progressing   Problem: Clinical Measurements: Goal: Diagnostic test results will improve Outcome: Progressing   Problem: Coping: Goal: Level of anxiety will decrease Outcome: Progressing   Problem: Pain Managment: Goal: General experience of comfort will improve Outcome: Progressing   

## 2023-03-05 NOTE — Progress Notes (Signed)
[facility] PROGRESS NOTE    Progress Note  Kathleen Jensen  WUJ:[id number] DOB: [birth year] DOA: 02/28/2023 PCP: System, Provider Not In     Brief Narrative:   Kathleen Jensen is an 72 y.o. female past medical history significant for CVA with residual expressive aphasia, right-sided hemiplegia, chronic atrial fibrillation not on anticoagulation status post pacemaker, seizure disorder, adult failure to thrive with PEG tube feeding that was placed on 02/28/2023 comes from [facility] for altered mental status   Assessment/Plan:   Acute metabolic encephalopathy: Likely due to infectious etiology she was told empirically on antibiotics. CT scan of the head was unrevealing.  Gram-negative rod UTI: Urine culture grew E. coli Pseudomonas and Serratia all sensitive to cefepime, Bactrim and Rocephin. She is currently on IV cefepime. Will de-escalate to oral Bactrim she will need a 10-day treatment. Unfortunately blood cultures were not done on admission  History of CVA with residual aphasia: Continue aspirin and statins.  Chronic atrial fibrillation: Continue amiodarone and metoprolol, not a candidate for anticoagulation.  Sick sinus syndrome status post pacemaker: Noted.  History of seizure disorder Continue Dilantin Depakote and Keppra.  Essential hypertension: Continue amlodipine, losartan metoprolol and hydralazine.  Chronic hyponatremia: Currently stable.  Hypothyroidism: Continue Synthroid.  GERD: Continue Pepcid.  Nutrition: Continue tube feedings.  Stage IV sacral decubitus ulcer coccygeal and stage II posterior left thigh present on admission RN Pressure Injury Documentation: Pressure Injury 05/31/21 Sacrum Mid;Bilateral Open area- painful to touch (Active)  05/31/21 0830  Location: Sacrum  Location Orientation: Mid;Bilateral  Staging:   Wound Description (Comments): Open area- painful to touch  Present on Admission:      Pressure  Injury 02/15/22 Sacrum Medial Stage 4 - Full thickness tissue loss with exposed bone, tendon or muscle. (Active)  02/15/22 2200  Location: Sacrum  Location Orientation: Medial  Staging: Stage 4 - Full thickness tissue loss with exposed bone, tendon or muscle.  Wound Description (Comments):   Present on Admission: Yes     Pressure Injury 03/01/23 Coccyx Medial Stage 4 - Full thickness tissue loss with exposed bone, tendon or muscle. Appears to be stage 4 to sacrum/coccyx area (Active)  03/01/23   Location: Coccyx  Location Orientation: Medial  Staging: Stage 4 - Full thickness tissue loss with exposed bone, tendon or muscle.  Wound Description (Comments): Appears to be stage 4 to sacrum/coccyx area  Present on Admission: Yes  Dressing Type Foam - Lift dressing to assess site every shift;Gauze (Comment) 03/05/23 0750     Pressure Injury 03/01/23 Thigh Left;Posterior Stage 2 -  Partial thickness loss of dermis presenting as a shallow open injury with a red, pink wound bed without slough. (Active)  03/01/23   Location: Thigh  Location Orientation: Left;Posterior  Staging: Stage 2 -  Partial thickness loss of dermis presenting as a shallow open injury with a red, pink wound bed without slough.  Wound Description (Comments):   Present on Admission: Yes  Dressing Type Foam - Lift dressing to assess site every shift 03/05/23 0750     DVT prophylaxis: lovenox Family Communication:none Status is: Inpatient Remains inpatient appropriate because: Acute metabolic encephalopathy    Code Status:     Code Status Orders  (From admission, onward)           Start     Ordered   03/01/23 0228  Full code  Continuous       Question:  By:  Answer:  Consent: discussion documented in EHR   03/01/23  8119           Code Status History     Date Active Date Inactive Code Status Order ID Comments User Context   11/16/2022 2216 12/03/2022 1824 DNR [id number]  Reva Bores, MD ED   11/16/2022  2042 11/16/2022 2216 DNR [id number]  Rondel Baton, MD ED   11/16/2022 1325 11/16/2022 2042 Full Code [id number]  Rondel Baton, MD ED   05/15/2022 1946 05/18/2022 2321 Full Code [id number]  Tiffany Kocher, DO ED   02/15/2022 1947 02/22/2022 1859 Full Code [id number]  Leslye Peer, MD ED   05/29/2021 1500 06/07/2021 0431 Full Code [id number]  Orland Mustard, MD ED   04/12/2020 0114 04/14/2020 1847 Full Code [id number]  Hillary Bow, DO ED   10/05/2017 1544 10/15/2017 1619 Full Code [id number]  Black, Lesle Chris, NP ED         IV Access:   Peripheral IV   Procedures and diagnostic studies:   No results found.   Medical Consultants:   None.   Subjective:    Kathleen Jensen no complaints.  Objective:    Vitals:   03/04/23 1426 03/04/23 2202 03/05/23 0420 03/05/23 0500  BP: 115/87 134/80 131/72   Pulse: 66 77 64   Resp: 20 18 16    Temp: 97.7 F (36.5 C) 99.1 F (37.3 C) 98.7 F (37.1 C)   TempSrc: Oral Oral Oral   SpO2: 97% 98% 96%   Weight:    95.4 kg  Height:       SpO2: 96 %   Intake/Output Summary (Last 24 hours) at 03/05/2023 1001 Last data filed at 03/05/2023 0600 Gross per 24 hour  Intake 1843.25 ml  Output 778 ml  Net 1065.25 ml   Filed Weights   03/03/23 0430 03/04/23 0500 03/05/23 0500  Weight: 90.6 kg 94.5 kg 95.4 kg    Exam: General exam: In no acute distress. Respiratory system: Good air movement and clear to auscultation. Cardiovascular system: S1 & S2 heard, RRR. No JVD. Gastrointestinal system: Abdomen is nondistended, soft and nontender.  Extremities: No pedal edema. Skin: No rashes, lesions or ulcers  Data Reviewed:    Labs: Basic Metabolic Panel: Recent Labs  Lab 02/28/23 2020 03/01/23 0500 03/01/23 1602 03/02/23 0516 03/02/23 1657 03/03/23 0455 03/04/23 0348 03/05/23 0351  NA 132* 136  --   --   --  133* 135 134*  K 4.4 3.3*  --   --   --  3.6 3.9 3.8  CL 98 109  --   --   --  103 105 105  CO2 29 23  --   --    --  25 24 22   GLUCOSE 82 75  --   --   --  112* 97 102*  BUN 21 17  --   --   --  24* 29* 30*  CREATININE 0.52 0.34*  --   --   --  0.46 0.56 0.45  CALCIUM 8.2* 6.5*  --   --   --  7.9* 8.1* 7.7*  MG  --   --  1.6* 2.0 2.1 1.9  --   --   PHOS  --   --  3.3 3.7 3.5 3.1  --   --    GFR Estimated Creatinine Clearance: 72.7 mL/min (by C-G formula based on SCr of 0.45 mg/dL). Liver Function Tests: Recent Labs  Lab 02/28/23 2020 03/01/23 0500  AST 61* 44*  ALT 25 22  ALKPHOS  171* 108  BILITOT 0.6 0.4  PROT 9.1* 6.6  ALBUMIN 2.3* 1.6*   No results for input(s): "LIPASE", "AMYLASE" in the last 168 hours. No results for input(s): "AMMONIA" in the last 168 hours. Coagulation profile No results for input(s): "INR", "PROTIME" in the last 168 hours. COVID-19 Labs  No results for input(s): "DDIMER", "FERRITIN", "LDH", "CRP" in the last 72 hours.  Lab Results  Component Value Date   SARSCOV2NAA NEGATIVE 11/16/2022   SARSCOV2NAA NEGATIVE 02/15/2022   SARSCOV2NAA NEGATIVE 04/11/2020    CBC: Recent Labs  Lab 02/28/23 2124 03/01/23 0500 03/01/23 1602 03/05/23 0351  WBC 7.6 7.1 8.3 8.2  NEUTROABS 3.4  --   --   --   HGB 13.3 12.1 13.4 11.2*  HCT 43.1 39.0 42.3 36.6  MCV 94.7 94.7 92.6 97.9  PLT 163 159 171 141*   Cardiac Enzymes: No results for input(s): "CKTOTAL", "CKMB", "CKMBINDEX", "TROPONINI" in the last 168 hours. BNP (last 3 results) No results for input(s): "PROBNP" in the last 8760 hours. CBG: Recent Labs  Lab 03/04/23 1630 03/04/23 2200 03/05/23 0004 03/05/23 0421 03/05/23 0743  GLUCAP 144* 110* 126* 111* 111*   D-Dimer: No results for input(s): "DDIMER" in the last 72 hours. Hgb A1c: No results for input(s): "HGBA1C" in the last 72 hours. Lipid Profile: No results for input(s): "CHOL", "HDL", "LDLCALC", "TRIG", "CHOLHDL", "LDLDIRECT" in the last 72 hours. Thyroid function studies: No results for input(s): "TSH", "T4TOTAL", "T3FREE", "THYROIDAB" in the  last 72 hours.  Invalid input(s): "FREET3" Anemia work up: No results for input(s): "VITAMINB12", "FOLATE", "FERRITIN", "TIBC", "IRON", "RETICCTPCT" in the last 72 hours. Sepsis Labs: Recent Labs  Lab 02/28/23 2027 02/28/23 2124 03/01/23 0500 03/01/23 1602 03/05/23 0351  WBC  --  7.6 7.1 8.3 8.2  LATICACIDVEN 1.7  --   --   --   --    Microbiology Recent Results (from the past 240 hour(s))  Urine Culture     Status: Abnormal   Collection Time: 02/28/23  7:45 PM   Specimen: Urine, Catheterized  Result Value Ref Range Status   Specimen Description   Final    URINE, CATHETERIZED Performed at [facility], [address] [place], [place] [address]    Special Requests   Final    NONE Performed at [facility], [address] [place], [place] [address]    Culture (A)  Final    >=100,000 COLONIES/mL ESCHERICHIA COLI >=100,000 COLONIES/mL PROVIDENCIA STUARTII >=100,000 COLONIES/mL PSEUDOMONAS AERUGINOSA    Report Status 03/03/2023 FINAL  Final   Organism ID, Bacteria ESCHERICHIA COLI (A)  Final   Organism ID, Bacteria PROVIDENCIA STUARTII (A)  Final   Organism ID, Bacteria PSEUDOMONAS AERUGINOSA (A)  Final      Susceptibility   Escherichia coli - MIC*    AMPICILLIN <=2 SENSITIVE Sensitive     CEFAZOLIN <=4 SENSITIVE Sensitive     CEFEPIME <=0.12 SENSITIVE Sensitive     CEFTRIAXONE <=0.25 SENSITIVE Sensitive     CIPROFLOXACIN <=0.25 SENSITIVE Sensitive     GENTAMICIN <=1 SENSITIVE Sensitive     IMIPENEM <=0.25 SENSITIVE Sensitive     NITROFURANTOIN 32 SENSITIVE Sensitive     TRIMETH/SULFA <=20 SENSITIVE Sensitive     AMPICILLIN/SULBACTAM <=2 SENSITIVE Sensitive     PIP/TAZO <=4 SENSITIVE Sensitive     * >=100,000 COLONIES/mL ESCHERICHIA COLI   Pseudomonas aeruginosa - MIC*    CEFTAZIDIME 2 SENSITIVE Sensitive     CIPROFLOXACIN <=0.25 SENSITIVE Sensitive  GENTAMICIN 2 SENSITIVE Sensitive     IMIPENEM 2 SENSITIVE Sensitive      PIP/TAZO 8 SENSITIVE Sensitive     CEFEPIME 2 SENSITIVE Sensitive     * >=100,000 COLONIES/mL PSEUDOMONAS AERUGINOSA   Providencia stuartii - MIC*    AMPICILLIN >=32 RESISTANT Resistant     CEFEPIME <=0.12 SENSITIVE Sensitive     CEFTRIAXONE <=0.25 SENSITIVE Sensitive     CIPROFLOXACIN >=4 RESISTANT Resistant     GENTAMICIN RESISTANT Resistant     IMIPENEM 1 SENSITIVE Sensitive     NITROFURANTOIN 256 RESISTANT Resistant     TRIMETH/SULFA <=20 SENSITIVE Sensitive     AMPICILLIN/SULBACTAM >=32 RESISTANT Resistant     PIP/TAZO <=4 SENSITIVE Sensitive     * >=100,000 COLONIES/mL PROVIDENCIA STUARTII     Medications:    acetaminophen  640 mg Per Tube Q8H   amiodarone  200 mg Per Tube Daily   amLODipine  10 mg Per Tube Daily   ascorbic acid  500 mg Oral Daily   aspirin  81 mg Oral Daily   Chlorhexidine Gluconate Cloth  6 each Topical Daily   cholecalciferol  1,000 Units Per Tube QHS   cyclobenzaprine  2.5 mg Per Tube BID   enoxaparin (LOVENOX) injection  40 mg Subcutaneous Q24H   famotidine  40 mg Per Tube Daily   feeding supplement (PROSource TF20)  60 mL Per Tube BID   folic acid  1 mg Per Tube Daily   free water  150 mL Per Tube Q4H   levETIRAcetam  1,000 mg Per Tube BID   liver oil-zinc oxide   Topical TID   losartan  25 mg Per Tube Daily   metoprolol tartrate  25 mg Per Tube BID   multivitamin with minerals  1 tablet Oral Daily   nystatin   Topical Once   phenytoin  75 mg Per Tube BID   senna-docusate  2 tablet Per Tube BID   simethicone  80 mg Per Tube Daily   simvastatin  10 mg Per Tube QHS   sodium chloride flush  3 mL Intravenous Q12H   valproic acid  500 mg Per Tube BID   valproic acid  750 mg Oral QHS   zinc sulfate  220 mg Per Tube Daily   Continuous Infusions:  sodium chloride     ceFEPime (MAXIPIME) IV 2 g (03/04/23 2151)   feeding supplement (OSMOLITE 1.5 CAL) Stopped (03/05/23 0920)      LOS: 4 days   Marinda Elk  [facility]  03/05/2023, 10:01 AM

## 2023-03-05 NOTE — Plan of Care (Signed)
  Problem: Safety: Goal: Ability to remain free from injury will improve Outcome: Progressing   

## 2023-03-06 ENCOUNTER — Emergency Department (HOSPITAL_COMMUNITY)
Admission: EM | Admit: 2023-03-06 | Discharge: 2023-03-06 | Disposition: A | Discharge status: Home / Self Care | Payer: Medicare HMO | Attending: Emergency Medicine | Admitting: Emergency Medicine

## 2023-03-06 DIAGNOSIS — Z4889 Encounter for other specified surgical aftercare: Secondary | ICD-10-CM

## 2023-03-06 DIAGNOSIS — Z7982 Long term (current) use of aspirin: Secondary | ICD-10-CM | POA: Insufficient documentation

## 2023-03-06 DIAGNOSIS — K9413 Enterostomy malfunction: Secondary | ICD-10-CM | POA: Diagnosis present

## 2023-03-06 DIAGNOSIS — N3000 Acute cystitis without hematuria: Secondary | ICD-10-CM | POA: Diagnosis not present

## 2023-03-06 LAB — GLUCOSE, CAPILLARY
Glucose-Capillary: 85 mg/dL (ref 70–99)
Glucose-Capillary: 110 mg/dL — ABNORMAL HIGH (ref 70–99)
Glucose-Capillary: 97 mg/dL (ref 70–99)

## 2023-03-06 MED ORDER — AMLODIPINE BESYLATE 10 MG PO TABS: 10.0000 mg | ORAL_TABLET | Freq: Every day | ORAL | Status: DC

## 2023-03-06 MED ORDER — LOSARTAN POTASSIUM 25 MG PO TABS: 25.0000 mg | ORAL_TABLET | Freq: Every day | ORAL | Status: DC

## 2023-03-06 MED ORDER — HYDROCODONE-ACETAMINOPHEN 5-325 MG PO TABS: 1.0000 | ORAL_TABLET | Freq: Every day | ORAL | 0 refills | Status: DC | PRN

## 2023-03-06 MED ORDER — SULFAMETHOXAZOLE-TRIMETHOPRIM 800-160 MG PO TABS: 1.0000 | ORAL_TABLET | Freq: Two times a day (BID) | ORAL | Status: AC

## 2023-03-06 MED ORDER — VALTOCO 10 MG DOSE 10 MG/0.1ML NA LIQD: 20.0000 mg | NASAL | 0 refills | Status: DC | PRN

## 2023-03-06 MED ORDER — NYSTATIN 100000 UNIT/GM EX POWD: Freq: Once | CUTANEOUS | Status: AC

## 2023-03-06 MED ORDER — FREE WATER
200.0000 mL | Freq: Four times a day (QID) | Status: DC
  Administered 2023-03-06: 200 mL

## 2023-03-06 NOTE — Plan of Care (Signed)
  Problem: Clinical Measurements: Goal: Ability to maintain clinical measurements within normal limits will improve Outcome: Progressing Goal: Cardiovascular complication will be avoided Outcome: Progressing   Problem: Pain Managment: Goal: General experience of comfort will improve Outcome: Progressing   Problem: Safety: Goal: Ability to remain free from injury will improve Outcome: Progressing

## 2023-03-06 NOTE — Progress Notes (Signed)
Attempted to call report to [name]. °

## 2023-03-06 NOTE — ED Provider Notes (Signed)
[place] EMERGENCY DEPARTMENT AT [facility] Provider Note   CSN: [id number] Arrival date & time: 03/06/23  1824     History  Chief Complaint  Patient presents with   J-Tube replacement    Kathleen Jensen is a 72 y.o. female.  72 year old female presents due to malfunctioning J-tube.  Was just discharged in the hospital today and at facility they found some cracking around the adapter site.  They have not used the J-tube since then.  No other complaints noted      Home Medications Prior to Admission medications   Medication Sig Start Date End Date Taking? Authorizing Provider  acetaminophen (TYLENOL) 160 MG/5ML solution Place 20 mLs (640 mg total) into feeding tube every 8 (eight) hours. Patient taking differently: Place 800 mg into feeding tube every 6 (six) hours. 12/03/22   Elgergawy, Leana Roe, MD  amiodarone (PACERONE) 200 MG tablet Place 1 tablet (200 mg total) into feeding tube daily. 12/03/22   Elgergawy, Leana Roe, MD  amLODipine (NORVASC) 10 MG tablet Place 1 tablet (10 mg total) into feeding tube daily. 03/06/23   Marinda Elk, MD  ascorbic acid (VITAMIN C) 500 MG tablet Place 1 tablet (500 mg total) into feeding tube 2 (two) times daily. 02/22/22   Glade Lloyd, MD  aspirin EC 81 MG tablet 81 mg at bedtime. Via tube    [provider]  chlorhexidine (PERIDEX) 0.12 % solution Use as directed 15 mLs in the mouth or throat 2 (two) times daily.    [provider]  cyclobenzaprine (FLEXERIL) 5 MG tablet Place 2.5 mg into feeding tube in the morning and at bedtime. Hold If Sedated    [provider]  diazePAM (VALTOCO 10 MG DOSE) 10 MG/0.1ML LIQD Place 20 mg into the nose as needed for up to 3 days (intranasal Valtoco 20 mg (10 mg in each nostril) if she has any clinical seizure). 03/06/23 03/09/23  Marinda Elk, MD  famotidine (PEPCID) 40 MG/5ML suspension Place 40 mg into feeding tube daily. 03/25/22   [provider]  folic acid (FOLVITE) 800 MCG tablet Place 800 mcg into feeding tube daily.    [provider]  HYDROcodone-acetaminophen (NORCO/VICODIN) 5-325 MG tablet Place 1 tablet into feeding tube daily as needed for moderate pain. 03/06/23   Marinda Elk, MD  levETIRAcetam (KEPPRA) 100 MG/ML solution Place 1,000 mg into feeding tube 2 (two) times daily.    [provider]  losartan (COZAAR) 25 MG tablet Place 1 tablet (25 mg total) into feeding tube daily. 03/06/23   Marinda Elk, MD  Menthol, Topical Analgesic, (BIOFREEZE) 4 % GEL Apply 2 g topically 2 (two) times daily. Upper left arm    [provider]  metoprolol tartrate (LOPRESSOR) 25 MG tablet Place 1 tablet (25 mg total) into feeding tube 2 (two) times daily. Patient taking differently: Place 37.5 mg into feeding tube 2 (two) times daily. 12/03/22   Elgergawy, Leana Roe, MD  nutrition supplement, JUVEN, (JUVEN) PACK Place 1 packet into feeding tube 2 (two) times daily between meals. Patient not taking: Reported on 03/01/2023 12/03/22   Elgergawy, Leana Roe, MD  Nutritional Supplements (FEEDING SUPPLEMENT, JEVITY 1.2 CAL,) LIQD Place 1,000 mLs into feeding tube continuous. At 38ml/hr Patient not taking: Reported on 03/01/2023 02/22/22   Glade Lloyd, MD  Nutritional Supplements (FEEDING SUPPLEMENT, PROSOURCE TF,) liquid Place 45 mLs into feeding tube 3 (three) times daily. Patient not taking: Reported on 03/01/2023 02/22/22  Glade Lloyd, MD  nystatin (MYCOSTATIN/NYSTOP) powder Apply topically once for 1 dose. 03/06/23 03/06/23  Marinda Elk, MD  phenytoin (DILANTIN) 125 MG/5ML suspension Place 4 mLs (100 mg total) into feeding tube 2 (two) times daily. PLEASE RESUME IN 1 WEEK ONLY AFTER CHECKING DILANTIN LEVEL Patient taking differently: Place 75 mg into feeding tube 2 (two) times daily. 12/10/22   Elgergawy, Leana Roe, MD  polyethylene glycol (MIRALAX / GLYCOLAX) 17 g packet Place 34 g into feeding tube daily as  needed for mild constipation. Patient not taking: Reported on 03/01/2023 02/22/22   Glade Lloyd, MD  senna-docusate (SENOKOT-S) 8.6-50 MG tablet Place 2 tablets into feeding tube 2 (two) times daily. 02/22/22   Glade Lloyd, MD  Simethicone 80 MG TABS 1 tablet (80 mg total) by PEG Tube route in the morning and at bedtime. Patient taking differently: Give 80 mg by tube in the morning and at bedtime. Simethicone 80mg  chewable tablet 02/22/22   Glade Lloyd, MD  simvastatin (ZOCOR) 10 MG tablet Place 1 tablet (10 mg total) into feeding tube at bedtime. 02/22/22   Glade Lloyd, MD  sulfamethoxazole-trimethoprim (BACTRIM DS) 800-160 MG tablet Take 1 tablet by mouth every 12 (twelve) hours for 3 days. 03/06/23 03/09/23  Marinda Elk, MD  valproic acid (DEPAKENE) 250 MG/5ML solution Take 500-750 mg by mouth at bedtime. Give 500mg  three times daily and 750mg  at bedtime    [provider]  Zinc 50 MG TABS Give 50 mg by tube daily.    [provider]      Allergies    Tape, Demerol [meperidine hcl], Sulfonamide derivatives, Bacitracin-polymyxin b, and Oxycontin [oxycodone]    Review of Systems   Review of Systems  All other systems reviewed and are negative.   Physical Exam Updated Vital Signs BP (!) 154/77   Pulse 63   Resp 18   SpO2 100%  Physical Exam Vitals and nursing note reviewed.  Constitutional:      General: She is not in acute distress.    Appearance: Normal appearance. She is well-developed. She is not toxic-appearing.  HENT:     Head: Normocephalic and atraumatic.  Eyes:     General: Lids are normal.     Conjunctiva/sclera: Conjunctivae normal.     Pupils: Pupils are equal, round, and reactive to light.  Neck:     Thyroid: No thyroid mass.     Trachea: No tracheal deviation.  Cardiovascular:     Rate and Rhythm: Normal rate and regular rhythm.     Heart sounds: Normal heart sounds. No murmur heard.    No gallop.  Pulmonary:     Effort:  Pulmonary effort is normal. No respiratory distress.     Breath sounds: Normal breath sounds. No stridor. No decreased breath sounds, wheezing, rhonchi or rales.  Abdominal:     General: There is no distension.     Palpations: Abdomen is soft.     Tenderness: There is no abdominal tenderness. There is no rebound.    Musculoskeletal:        General: No tenderness. Normal range of motion.     Cervical back: Normal range of motion and neck supple.  Skin:    General: Skin is warm and dry.     Findings: No abrasion or rash.  Neurological:     Mental Status: She is alert. Mental status is at baseline. She is disoriented.     GCS: GCS eye subscore is 4. GCS verbal subscore is  5. GCS motor subscore is 6.     Cranial Nerves: Cranial nerves are intact. No cranial nerve deficit.     Sensory: No sensory deficit.     Motor: Motor function is intact.  Psychiatric:        Attention and Perception: She is inattentive.    ED Results / Procedures / Treatments   Labs (all labs ordered are listed, but only abnormal results are displayed) Labs Reviewed - No data to display  EKG None  Radiology No results found.  Procedures Procedures    Medications Ordered in ED Medications - No data to display  ED Course/ Medical Decision Making/ A&P                                 Medical Decision Making  Patient is J-tube is functional at this time.  Facility can have the tube replaced if they need to.        Final Clinical Impression(s) / ED Diagnoses Final diagnoses:  None    Rx / DC Orders ED Discharge Orders     None         Lorre Nick, MD 03/06/23 2016

## 2023-03-06 NOTE — ED Triage Notes (Signed)
Pt BIBA to have J-tube replaced. While feeding pt today, pt's food went all over her and staff had to clean her. Staff has not given meds due to J tube having a tear in it. Pt has right sided weakness due to past stroke.   BP 142/86 HR 62

## 2023-03-06 NOTE — Discharge Instructions (Signed)
The J-tube is functioning appropriate at this time

## 2023-03-06 NOTE — TOC Transition Note (Signed)
Transition of Care [facility]) - CM/SW Discharge Note   Patient Details  Name: Kathleen Jensen MRN: [id number] Date of Birth: [birth year]  [facility] CM/SW Contact:  Larrie Kass, LCSW Phone Number: 03/06/2023, 9:58 AM   Clinical Narrative:     Pt to d/c back to [facility], spoke with pt's daughter to inform her of transport . Call report [phone]. [facility] called no further TOC needs , TOC sign off.   Final next level of care: Long Term Nursing Home Barriers to Discharge: No Barriers Identified   Patient Goals and CMS Choice      Discharge Placement                  Patient to be transferred to facility by: ems   Patient and family notified of of transfer: 03/06/23  Discharge Plan and Services Additional resources added to the After Visit Summary for                                       Social Determinants of Health (SDOH) Interventions SDOH Screenings   Food Insecurity: Patient Unable To Answer (03/01/2023)  Housing: Low Risk  (03/05/2023)  Recent Concern: Housing - High Risk (03/01/2023)  Transportation Needs: Patient Unable To Answer (03/01/2023)  Utilities: Not At Risk (05/17/2022)  Depression (PHQ2-9): Low Risk  (05/22/2019)  Tobacco Use: Medium Risk (03/01/2023)     Readmission Risk Interventions    02/18/2022    2:23 PM  Readmission Risk Prevention Plan  Transportation Screening Complete  PCP or Specialist Appt within 5-7 Days Complete  Home Care Screening Complete  Medication Review (RN CM) Complete

## 2023-03-06 NOTE — Discharge Summary (Signed)
Physician Discharge Summary  Kathleen Jensen OZH:[id number] DOB: [birth year] DOA: 02/28/2023  PCP: System, Provider Not In  Admit date: 02/28/2023 Discharge date: 03/06/2023  Admitted From: SNF Disposition:  SNF  Recommendations for Outpatient Follow-up:  Follow up with PCP in 1-2 weeks   Home Health:No Equipment/Devices:None  Discharge Condition:Stable CODE STATUS:Full Diet recommendation: Heart Healthy   Brief/Interim Summary: 72 y.o. female past medical history significant for CVA with residual expressive aphasia, right-sided hemiplegia, chronic atrial fibrillation not on anticoagulation status post pacemaker, seizure disorder, adult failure to thrive with PEG tube feeding that was placed on 02/28/2023 comes from [facility] for altered mental status   Discharge Diagnoses:  Principal Problem:   Acute cystitis Active Problems:   Delirium secondary to UTI   Hypothyroidism   Essential hypertension   Sick sinus syndrome (HCC)   History of expressive aphasia   Chronic anemia   History of CVA (cerebrovascular accident)   History of right hemiplegia (HCC)   Atrial fibrillation, chronic (HCC)   History of seizure   Chronic hyponatremia  Acute metabolic encephalopathy: Likely due to infectious etiology resolved with IV antibiotics. CT scan of the head was unrevealing.  UTI due to E. coli, Pseudomonas and Serratia: Urine culture grew E. coli, Pseudomonas and Serratia all sensitive to cefepime and Bactrim. Admission she was started on cefepime, she was transition to oral Bactrim which she tolerated she defervesced leukocytosis resolved she will continue this for 10 additional days and outpatient.  History of CVA: Continue aspirin and statins.  Chronic atrial fibrillation: Not a candidate for anticoagulation continue amiodarone and metoprolol.  Sick sinus syndrome status post pacemaker: Noted.  History of the seizure disorder: Continue Dilantin, Depakote and  Keppra.  Essential hypertension: Continue amlodipine losartan and metoprolol blood pressure is stable.  Chronic hyponatremia: Currently stable.  Hypothyroidism: Continue Synthroid.  GERD: Continue Pepcid.  Nutrition: Apparently there was some malfunction with the PEG tube at the facility, here PEG tube has been working well we were able to feed her through the PEG tube, give medications and also give free water.  Stage IV sacral decubitus ulcer coccygeal and stage II posterior left thigh present on admission:   Discharge Instructions  Discharge Instructions     Diet - low sodium heart healthy   Complete by: As directed    Increase activity slowly   Complete by: As directed    No wound care   Complete by: As directed       Allergies as of 03/06/2023       Reactions   Tape Rash   Demerol [meperidine Hcl] Nausea And Vomiting   Sulfonamide Derivatives Itching   Bacitracin-polymyxin B Dermatitis   Oxycontin [oxycodone] Itching        Medication List     STOP taking these medications    free water Soln       TAKE these medications    acetaminophen 160 MG/5ML solution Commonly known as: TYLENOL Place 20 mLs (640 mg total) into feeding tube every 8 (eight) hours. What changed:  how much to take when to take this   amiodarone 200 MG tablet Commonly known as: PACERONE Place 1 tablet (200 mg total) into feeding tube daily.   amLODipine 10 MG tablet Commonly known as: NORVASC Place 1 tablet (10 mg total) into feeding tube daily.   ascorbic acid 500 MG tablet Commonly known as: VITAMIN C Place 1 tablet (500 mg total) into feeding tube 2 (two) times daily.   aspirin EC  81 MG tablet 81 mg at bedtime. Via tube   Biofreeze 4 % Gel Generic drug: Menthol (Topical Analgesic) Apply 2 g topically 2 (two) times daily. Upper left arm   chlorhexidine 0.12 % solution Commonly known as: PERIDEX Use as directed 15 mLs in the mouth or throat 2 (two) times  daily.   cyclobenzaprine 5 MG tablet Commonly known as: FLEXERIL Place 2.5 mg into feeding tube in the morning and at bedtime. Hold If Sedated   famotidine 40 MG/5ML suspension Commonly known as: PEPCID Place 40 mg into feeding tube daily.   feeding supplement (JEVITY 1.2 CAL) Liqd Place 1,000 mLs into feeding tube continuous. At 22ml/hr   feeding supplement (PROSource TF) liquid Place 45 mLs into feeding tube 3 (three) times daily.   nutrition supplement (JUVEN) Pack Place 1 packet into feeding tube 2 (two) times daily between meals.   folic acid 800 MCG tablet Commonly known as: FOLVITE Place 800 mcg into feeding tube daily.   HYDROcodone-acetaminophen 5-325 MG tablet Commonly known as: NORCO/VICODIN Place 1 tablet into feeding tube daily as needed for moderate pain. What changed:  when to take this reasons to take this   levETIRAcetam 100 MG/ML solution Commonly known as: KEPPRA Place 1,000 mg into feeding tube 2 (two) times daily.   losartan 25 MG tablet Commonly known as: COZAAR Place 1 tablet (25 mg total) into feeding tube daily.   metoprolol tartrate 25 MG tablet Commonly known as: LOPRESSOR Place 1 tablet (25 mg total) into feeding tube 2 (two) times daily. What changed: how much to take   nystatin powder Commonly known as: MYCOSTATIN/NYSTOP Apply topically once for 1 dose.   phenytoin 125 MG/5ML suspension Commonly known as: DILANTIN Place 4 mLs (100 mg total) into feeding tube 2 (two) times daily. PLEASE RESUME IN 1 WEEK ONLY AFTER CHECKING DILANTIN LEVEL What changed:  how much to take additional instructions   polyethylene glycol 17 g packet Commonly known as: MIRALAX / GLYCOLAX Place 34 g into feeding tube daily as needed for mild constipation.   senna-docusate 8.6-50 MG tablet Commonly known as: Senokot-S Place 2 tablets into feeding tube 2 (two) times daily.   Simethicone 80 MG Tabs 1 tablet (80 mg total) by PEG Tube route in the morning  and at bedtime. What changed:  how to take this additional instructions   simvastatin 10 MG tablet Commonly known as: ZOCOR Place 1 tablet (10 mg total) into feeding tube at bedtime.   sulfamethoxazole-trimethoprim 800-160 MG tablet Commonly known as: BACTRIM DS Take 1 tablet by mouth every 12 (twelve) hours for 3 days.   valproic acid 250 MG/5ML solution Commonly known as: DEPAKENE Take 500-750 mg by mouth at bedtime. Give 500mg  three times daily and 750mg  at bedtime   Valtoco 10 MG Dose 10 MG/0.1ML Liqd Generic drug: diazePAM Place 20 mg into the nose as needed for up to 3 days (intranasal Valtoco 20 mg (10 mg in each nostril) if she has any clinical seizure). What changed: Another medication with the same name was removed. Continue taking this medication, and follow the directions you see here.   Zinc 50 MG Tabs Give 50 mg by tube daily.        Allergies  Allergen Reactions   Tape Rash   Demerol [Meperidine Hcl] Nausea And Vomiting   Sulfonamide Derivatives Itching   Bacitracin-Polymyxin B Dermatitis   Oxycontin [Oxycodone] Itching    Consultations: None   Procedures/Studies: DG ABDOMEN PEG TUBE LOCATION  Result  Date: 03/01/2023 CLINICAL DATA:  J-tube placement. EXAM: ABDOMEN - 1 VIEW COMPARISON:  01/26/2023 FINDINGS: KUB obtained after injection of 30 cc Omnipaque through the patient's J-tube. Jejunal loops in the central abdomen are opacified consistent with J-tube tip position in the jejunum. There is diffuse gaseous distention of bowel in the visualized abdomen, similar to prior. IMPRESSION: J-tube tip is in the jejunum. Electronically Signed   By: Kennith Center M.D.   On: 03/01/2023 14:40   CT Head Wo Contrast  Result Date: 02/28/2023 CLINICAL DATA:  Altered level of consciousness EXAM: CT HEAD WITHOUT CONTRAST TECHNIQUE: Contiguous axial images were obtained from the base of the skull through the vertex without intravenous contrast. RADIATION DOSE REDUCTION:  This exam was performed according to the departmental dose-optimization program which includes automated exposure control, adjustment of the mA and/or kV according to patient size and/or use of iterative reconstruction technique. COMPARISON:  11/16/2022 FINDINGS: Evaluation is extremely limited by patient motion throughout the exam. Brain: Chronic encephalomalacia within the left parietal lobe. Diffuse cerebral atrophy unchanged. No evidence of acute infarct or hemorrhage. Ex vacuo dilatation of the lateral ventricles. Midline structures are unremarkable. No acute extra-axial fluid collections. No mass effect. Vascular: No hyperdense vessel or unexpected calcification. Skull: Normal. Negative for fracture or focal lesion. Sinuses/Orbits: No acute finding. Other: None. IMPRESSION: 1. Limited study due to patient motion. 2. Diffuse cerebral atrophy and chronic ischemic changes as above. No acute intracranial process. Electronically Signed   By: Sharlet Salina M.D.   On: 02/28/2023 22:35   DG Chest Portable 1 View  Result Date: 02/28/2023 CLINICAL DATA:  illness EXAM: PORTABLE CHEST 1 VIEW COMPARISON:  CXR 11/25/22 FINDINGS: Unchanged asymmetric elevation of the right hemidiaphragm. Left-sided dual lead cardiac device in place with unchanged lead positioning. No pleural effusion. No pneumothorax. Unchanged cardiac and mediastinal contours. No focal airspace opacity. No radiographically apparent displaced rib fractures. Visualized upper abdomen is unremarkable. There are surgical clips in the right upper quadrant. Degenerative changes of the right glenohumeral joint. IMPRESSION: No focal airspace opacity Electronically Signed   By: Lorenza Cambridge M.D.   On: 02/28/2023 18:27   (Echo, Carotid, EGD, Colonoscopy, ERCP)    Subjective: No complaints.  Discharge Exam: Vitals:   03/05/23 2017 03/06/23 0410  BP: 132/80 (!) 138/91  Pulse: 60 60  Resp: 18 18  Temp: 98.5 F (36.9 C) 98.5 F (36.9 C)  SpO2: 99% 97%    Vitals:   03/05/23 1319 03/05/23 2017 03/06/23 0410 03/06/23 0410  BP: 126/77 132/80  (!) 138/91  Pulse: 60 60  60  Resp: 20 18  18   Temp: 98.6 F (37 C) 98.5 F (36.9 C)  98.5 F (36.9 C)  TempSrc: Oral Oral  Oral  SpO2: 100% 99%  97%  Weight:   97.6 kg   Height:        General: Pt is alert, awake, not in acute distress Cardiovascular: RRR, S1/S2 +, no rubs, no gallops Respiratory: CTA bilaterally, no wheezing, no rhonchi Abdominal: Soft, NT, ND, bowel sounds + Extremities: no edema, no cyanosis    The results of significant diagnostics from this hospitalization (including imaging, microbiology, ancillary and laboratory) are listed below for reference.     Microbiology: Recent Results (from the past 240 hour(s))  Urine Culture     Status: Abnormal   Collection Time: 02/28/23  7:45 PM   Specimen: Urine, Catheterized  Result Value Ref Range Status   Specimen Description   Final  URINE, CATHETERIZED Performed at [facility], [address], [place], [place] [address]    Special Requests   Final    NONE Performed at [facility], [address], [place], [place] [address]    Culture (A)  Final    >=100,000 COLONIES/mL ESCHERICHIA COLI >=100,000 COLONIES/mL PROVIDENCIA STUARTII >=100,000 COLONIES/mL PSEUDOMONAS AERUGINOSA    Report Status 03/03/2023 FINAL  Final   Organism ID, Bacteria ESCHERICHIA COLI (A)  Final   Organism ID, Bacteria PROVIDENCIA STUARTII (A)  Final   Organism ID, Bacteria PSEUDOMONAS AERUGINOSA (A)  Final      Susceptibility   Escherichia coli - MIC*    AMPICILLIN <=2 SENSITIVE Sensitive     CEFAZOLIN <=4 SENSITIVE Sensitive     CEFEPIME <=0.12 SENSITIVE Sensitive     CEFTRIAXONE <=0.25 SENSITIVE Sensitive     CIPROFLOXACIN <=0.25 SENSITIVE Sensitive     GENTAMICIN <=1 SENSITIVE Sensitive     IMIPENEM <=0.25 SENSITIVE Sensitive     NITROFURANTOIN 32 SENSITIVE Sensitive     TRIMETH/SULFA <=20  SENSITIVE Sensitive     AMPICILLIN/SULBACTAM <=2 SENSITIVE Sensitive     PIP/TAZO <=4 SENSITIVE Sensitive     * >=100,000 COLONIES/mL ESCHERICHIA COLI   Pseudomonas aeruginosa - MIC*    CEFTAZIDIME 2 SENSITIVE Sensitive     CIPROFLOXACIN <=0.25 SENSITIVE Sensitive     GENTAMICIN 2 SENSITIVE Sensitive     IMIPENEM 2 SENSITIVE Sensitive     PIP/TAZO 8 SENSITIVE Sensitive     CEFEPIME 2 SENSITIVE Sensitive     * >=100,000 COLONIES/mL PSEUDOMONAS AERUGINOSA   Providencia stuartii - MIC*    AMPICILLIN >=32 RESISTANT Resistant     CEFEPIME <=0.12 SENSITIVE Sensitive     CEFTRIAXONE <=0.25 SENSITIVE Sensitive     CIPROFLOXACIN >=4 RESISTANT Resistant     GENTAMICIN RESISTANT Resistant     IMIPENEM 1 SENSITIVE Sensitive     NITROFURANTOIN 256 RESISTANT Resistant     TRIMETH/SULFA <=20 SENSITIVE Sensitive     AMPICILLIN/SULBACTAM >=32 RESISTANT Resistant     PIP/TAZO <=4 SENSITIVE Sensitive     * >=100,000 COLONIES/mL PROVIDENCIA STUARTII     Labs: BNP (last 3 results) Recent Labs    11/28/22 0507 11/29/22 0426 11/30/22 0232  BNP 133.3* 227.3* 179.7*   Basic Metabolic Panel: Recent Labs  Lab 02/28/23 2020 03/01/23 0500 03/01/23 1602 03/02/23 0516 03/02/23 1657 03/03/23 0455 03/04/23 0348 03/05/23 0351  NA 132* 136  --   --   --  133* 135 134*  K 4.4 3.3*  --   --   --  3.6 3.9 3.8  CL 98 109  --   --   --  103 105 105  CO2 29 23  --   --   --  25 24 22   GLUCOSE 82 75  --   --   --  112* 97 102*  BUN 21 17  --   --   --  24* 29* 30*  CREATININE 0.52 0.34*  --   --   --  0.46 0.56 0.45  CALCIUM 8.2* 6.5*  --   --   --  7.9* 8.1* 7.7*  MG  --   --  1.6* 2.0 2.1 1.9  --   --   PHOS  --   --  3.3 3.7 3.5 3.1  --   --    Liver Function Tests: Recent Labs  Lab 02/28/23 2020 03/01/23 0500  AST 61* 44*  ALT 25 22  ALKPHOS 171* 108  BILITOT 0.6 0.4  PROT 9.1* 6.6  ALBUMIN 2.3* 1.6*   No results for input(s): "LIPASE", "AMYLASE" in the last 168 hours. No results for  input(s): "AMMONIA" in the last 168 hours. CBC: Recent Labs  Lab 02/28/23 2124 03/01/23 0500 03/01/23 1602 03/05/23 0351  WBC 7.6 7.1 8.3 8.2  NEUTROABS 3.4  --   --   --   HGB 13.3 12.1 13.4 11.2*  HCT 43.1 39.0 42.3 36.6  MCV 94.7 94.7 92.6 97.9  PLT 163 159 171 141*   Cardiac Enzymes: No results for input(s): "CKTOTAL", "CKMB", "CKMBINDEX", "TROPONINI" in the last 168 hours. BNP: Invalid input(s): "POCBNP" CBG: Recent Labs  Lab 03/05/23 1624 03/05/23 2019 03/05/23 2308 03/06/23 0407 03/06/23 0817  GLUCAP 142* 100* 82 85 110*   D-Dimer No results for input(s): "DDIMER" in the last 72 hours. Hgb A1c No results for input(s): "HGBA1C" in the last 72 hours. Lipid Profile No results for input(s): "CHOL", "HDL", "LDLCALC", "TRIG", "CHOLHDL", "LDLDIRECT" in the last 72 hours. Thyroid function studies No results for input(s): "TSH", "T4TOTAL", "T3FREE", "THYROIDAB" in the last 72 hours.  Invalid input(s): "FREET3" Anemia work up No results for input(s): "VITAMINB12", "FOLATE", "FERRITIN", "TIBC", "IRON", "RETICCTPCT" in the last 72 hours. Urinalysis    Component Value Date/Time   COLORURINE YELLOW 02/28/2023 1945   APPEARANCEUR CLOUDY (A) 02/28/2023 1945   LABSPEC 1.013 02/28/2023 1945   PHURINE 7.0 02/28/2023 1945   GLUCOSEU NEGATIVE 02/28/2023 1945   HGBUR NEGATIVE 02/28/2023 1945   BILIRUBINUR NEGATIVE 02/28/2023 1945   BILIRUBINUR 1+ 11/19/2016 1307   KETONESUR NEGATIVE 02/28/2023 1945   PROTEINUR NEGATIVE 02/28/2023 1945   UROBILINOGEN 1.0 11/19/2016 1307   NITRITE NEGATIVE 02/28/2023 1945   LEUKOCYTESUR LARGE (A) 02/28/2023 1945   Sepsis Labs Recent Labs  Lab 02/28/23 2124 03/01/23 0500 03/01/23 1602 03/05/23 0351  WBC 7.6 7.1 8.3 8.2   Microbiology Recent Results (from the past 240 hour(s))  Urine Culture     Status: Abnormal   Collection Time: 02/28/23  7:45 PM   Specimen: Urine, Catheterized  Result Value Ref Range Status   Specimen  Description   Final    URINE, CATHETERIZED Performed at [facility], [address] [place], [place] [address]    Special Requests   Final    NONE Performed at [facility], [address] [place], [place] [address]    Culture (A)  Final    >=100,000 COLONIES/mL ESCHERICHIA COLI >=100,000 COLONIES/mL PROVIDENCIA STUARTII >=100,000 COLONIES/mL PSEUDOMONAS AERUGINOSA    Report Status 03/03/2023 FINAL  Final   Organism ID, Bacteria ESCHERICHIA COLI (A)  Final   Organism ID, Bacteria PROVIDENCIA STUARTII (A)  Final   Organism ID, Bacteria PSEUDOMONAS AERUGINOSA (A)  Final      Susceptibility   Escherichia coli - MIC*    AMPICILLIN <=2 SENSITIVE Sensitive     CEFAZOLIN <=4 SENSITIVE Sensitive     CEFEPIME <=0.12 SENSITIVE Sensitive     CEFTRIAXONE <=0.25 SENSITIVE Sensitive     CIPROFLOXACIN <=0.25 SENSITIVE Sensitive     GENTAMICIN <=1 SENSITIVE Sensitive     IMIPENEM <=0.25 SENSITIVE Sensitive     NITROFURANTOIN 32 SENSITIVE Sensitive     TRIMETH/SULFA <=20 SENSITIVE Sensitive     AMPICILLIN/SULBACTAM <=2 SENSITIVE Sensitive     PIP/TAZO <=4 SENSITIVE Sensitive     * >=100,000 COLONIES/mL ESCHERICHIA COLI   Pseudomonas aeruginosa - MIC*    CEFTAZIDIME 2 SENSITIVE Sensitive  CIPROFLOXACIN <=0.25 SENSITIVE Sensitive     GENTAMICIN 2 SENSITIVE Sensitive     IMIPENEM 2 SENSITIVE Sensitive     PIP/TAZO 8 SENSITIVE Sensitive     CEFEPIME 2 SENSITIVE Sensitive     * >=100,000 COLONIES/mL PSEUDOMONAS AERUGINOSA   Providencia stuartii - MIC*    AMPICILLIN >=32 RESISTANT Resistant     CEFEPIME <=0.12 SENSITIVE Sensitive     CEFTRIAXONE <=0.25 SENSITIVE Sensitive     CIPROFLOXACIN >=4 RESISTANT Resistant     GENTAMICIN RESISTANT Resistant     IMIPENEM 1 SENSITIVE Sensitive     NITROFURANTOIN 256 RESISTANT Resistant     TRIMETH/SULFA <=20 SENSITIVE Sensitive     AMPICILLIN/SULBACTAM >=32 RESISTANT Resistant     PIP/TAZO <=4 SENSITIVE  Sensitive     * >=100,000 COLONIES/mL PROVIDENCIA STUARTII    SIGNED:   Marinda Elk, MD  [facility] 03/06/2023, 9:22 AM Pager   If 7PM-7AM, please contact night-coverage [email or web address] Password [email or web address]

## 2023-03-07 ENCOUNTER — Other Ambulatory Visit (HOSPITAL_COMMUNITY): Payer: Self-pay | Admitting: Gerontology

## 2023-03-07 ENCOUNTER — Ambulatory Visit (HOSPITAL_COMMUNITY)
Admission: RE | Admit: 2023-03-07 | Discharge: 2023-03-07 | Disposition: A | Discharge status: Home / Self Care | Payer: Medicare HMO | Source: Ambulatory Visit | Attending: Gerontology | Admitting: Gerontology

## 2023-03-07 DIAGNOSIS — R633 Feeding difficulties, unspecified: Secondary | ICD-10-CM | POA: Insufficient documentation

## 2023-03-07 DIAGNOSIS — Z434 Encounter for attention to other artificial openings of digestive tract: Secondary | ICD-10-CM | POA: Insufficient documentation

## 2023-03-07 HISTORY — PX: IR REPLC DUODEN/JEJUNO TUBE PERCUT W/FLUORO: IMG2334

## 2023-03-07 MED ORDER — IOHEXOL 300 MG/ML  SOLN
50.0000 mL | Freq: Once | INTRAMUSCULAR | Status: AC | PRN
  Administered 2023-03-07: 15 mL

## 2023-03-07 MED ORDER — LIDOCAINE VISCOUS HCL 2 % MT SOLN
OROMUCOSAL | Status: AC
  Filled 2023-03-07: qty 15

## 2023-03-07 MED ORDER — LIDOCAINE HCL 1 % IJ SOLN
INTRAMUSCULAR | Status: AC
  Filled 2023-03-07: qty 20

## 2023-03-07 NOTE — Procedures (Signed)
Interventional Radiology Procedure:   Indications: Broken J tube.  Procedure: Exchange of J tube  Findings: New 16 Fr J tube placed.  Approximately 16 cm removed from tip.  Balloon inflated with 4 ml of saline.  Contrast injection confirmed placement in small bowel.   Complications: None     EBL: Minimal  Plan: J tube is ready to be used.    R. Lowella Dandy, MD  Pager: [phone]

## 2023-03-09 ENCOUNTER — Other Ambulatory Visit: Payer: Self-pay

## 2023-03-09 ENCOUNTER — Emergency Department (HOSPITAL_COMMUNITY)
Admission: EM | Admit: 2023-03-09 | Discharge: 2023-03-10 | Disposition: A | Discharge status: Home / Self Care | Payer: Medicare HMO | Attending: Emergency Medicine | Admitting: Emergency Medicine

## 2023-03-09 ENCOUNTER — Emergency Department (HOSPITAL_COMMUNITY): Payer: Medicare HMO

## 2023-03-09 DIAGNOSIS — I10 Essential (primary) hypertension: Secondary | ICD-10-CM | POA: Insufficient documentation

## 2023-03-09 DIAGNOSIS — E871 Hypo-osmolality and hyponatremia: Secondary | ICD-10-CM | POA: Insufficient documentation

## 2023-03-09 DIAGNOSIS — Z79899 Other long term (current) drug therapy: Secondary | ICD-10-CM | POA: Insufficient documentation

## 2023-03-09 DIAGNOSIS — R569 Unspecified convulsions: Secondary | ICD-10-CM | POA: Diagnosis present

## 2023-03-09 DIAGNOSIS — Z7982 Long term (current) use of aspirin: Secondary | ICD-10-CM | POA: Diagnosis not present

## 2023-03-09 DIAGNOSIS — Z950 Presence of cardiac pacemaker: Secondary | ICD-10-CM | POA: Insufficient documentation

## 2023-03-09 DIAGNOSIS — Z8673 Personal history of transient ischemic attack (TIA), and cerebral infarction without residual deficits: Secondary | ICD-10-CM | POA: Diagnosis not present

## 2023-03-09 LAB — CBC WITH DIFFERENTIAL/PLATELET
Abs Immature Granulocytes: 0.02 10*3/uL (ref 0.00–0.07)
Basophils Absolute: 0 10*3/uL (ref 0.0–0.1)
Basophils Relative: 1 %
Eosinophils Absolute: 0.2 10*3/uL (ref 0.0–0.5)
Eosinophils Relative: 2 %
HCT: 39.6 % (ref 36.0–46.0)
Hemoglobin: 12.2 g/dL (ref 12.0–15.0)
Immature Granulocytes: 0 %
Lymphocytes Relative: 46 %
Lymphs Abs: 3.2 10*3/uL (ref 0.7–4.0)
MCH: 29.1 pg (ref 26.0–34.0)
MCHC: 30.8 g/dL (ref 30.0–36.0)
MCV: 94.5 fL (ref 80.0–100.0)
Monocytes Absolute: 0.7 10*3/uL (ref 0.1–1.0)
Monocytes Relative: 10 %
Neutro Abs: 2.9 10*3/uL (ref 1.7–7.7)
Neutrophils Relative %: 41 %
Platelets: 149 10*3/uL — ABNORMAL LOW (ref 150–400)
RBC: 4.19 MIL/uL (ref 3.87–5.11)
RDW: 17.6 % — ABNORMAL HIGH (ref 11.5–15.5)
WBC: 7 10*3/uL (ref 4.0–10.5)
nRBC: 0 % (ref 0.0–0.2)

## 2023-03-09 LAB — URINALYSIS, ROUTINE W REFLEX MICROSCOPIC
Bilirubin Urine: NEGATIVE
Glucose, UA: NEGATIVE mg/dL
Hgb urine dipstick: NEGATIVE
Ketones, ur: NEGATIVE mg/dL
Nitrite: NEGATIVE
Protein, ur: NEGATIVE mg/dL
Specific Gravity, Urine: 1.02 (ref 1.005–1.030)
Trans Epithel, UA: <1
pH: 6 (ref 5.0–8.0)

## 2023-03-09 LAB — COMPREHENSIVE METABOLIC PANEL
ALT: 27 U/L (ref 0–44)
AST: 62 U/L — ABNORMAL HIGH (ref 15–41)
Albumin: 2.1 g/dL — ABNORMAL LOW (ref 3.5–5.0)
Alkaline Phosphatase: 142 U/L — ABNORMAL HIGH (ref 38–126)
Anion gap: 6 (ref 5–15)
BUN: 18 mg/dL (ref 8–23)
CO2: 26 mmol/L (ref 22–32)
Calcium: 8.1 mg/dL — ABNORMAL LOW (ref 8.9–10.3)
Chloride: 101 mmol/L (ref 98–111)
Creatinine, Ser: 0.45 mg/dL (ref 0.44–1.00)
GFR, Estimated: >60 mL/min (ref >60)
Glucose, Bld: 66 mg/dL — ABNORMAL LOW (ref 70–99)
Potassium: 4.3 mmol/L (ref 3.5–5.1)
Sodium: 133 mmol/L — ABNORMAL LOW (ref 135–145)
Total Bilirubin: 0.3 mg/dL (ref 0.3–1.2)
Total Protein: 8.4 g/dL — ABNORMAL HIGH (ref 6.5–8.1)

## 2023-03-09 LAB — I-STAT VENOUS BLOOD GAS, ED
Acid-Base Excess: 4 mmol/L — ABNORMAL HIGH (ref 0.0–2.0)
Bicarbonate: 30.1 mmol/L — ABNORMAL HIGH (ref 20.0–28.0)
Calcium, Ion: 1.11 mmol/L — ABNORMAL LOW (ref 1.15–1.40)
HCT: 40 % (ref 36.0–46.0)
Hemoglobin: 13.6 g/dL (ref 12.0–15.0)
O2 Saturation: 98 %
Patient temperature: 37
Potassium: 4.6 mmol/L (ref 3.5–5.1)
Sodium: 139 mmol/L (ref 135–145)
TCO2: 32 mmol/L (ref 22–32)
pCO2, Ven: 52.5 mmHg (ref 44–60)
pH, Ven: 7.366 (ref 7.25–7.43)
pO2, Ven: 107 mmHg — ABNORMAL HIGH (ref 32–45)

## 2023-03-09 LAB — MAGNESIUM: Magnesium: 1.9 mg/dL (ref 1.7–2.4)

## 2023-03-09 LAB — CBG MONITORING, ED
Glucose-Capillary: 45 mg/dL — ABNORMAL LOW (ref 70–99)
Glucose-Capillary: 71 mg/dL (ref 70–99)

## 2023-03-09 LAB — RAPID URINE DRUG SCREEN, HOSP PERFORMED
Amphetamines: NOT DETECTED
Barbiturates: NOT DETECTED
Benzodiazepines: POSITIVE — AB
Cocaine: NOT DETECTED
Opiates: NOT DETECTED
Tetrahydrocannabinol: NOT DETECTED

## 2023-03-09 MED ORDER — DEXTROSE 50 % IV SOLN
1.0000 | Freq: Once | INTRAVENOUS | Status: AC
  Administered 2023-03-09: 50 mL via INTRAVENOUS
  Filled 2023-03-09: qty 50

## 2023-03-09 NOTE — ED Provider Notes (Incomplete)
[facility] AT [facility] Provider Note   CSN: [id number] Arrival date & time: 03/09/23  2136     History {Add pertinent medical, surgical, social history, OB history to HPI:1} Chief Complaint  Patient presents with  . Seizures    Kathleen Jensen is a 72 y.o. female.  HPI     Home Medications Prior to Admission medications   Medication Sig Start Date End Date Taking? Authorizing Provider  acetaminophen (TYLENOL) 160 MG/5ML solution Place 20 mLs (640 mg total) into feeding tube every 8 (eight) hours. Patient taking differently: Place 800 mg into feeding tube every 6 (six) hours. 12/03/22   Elgergawy, Leana Roe, MD  amiodarone (PACERONE) 200 MG tablet Place 1 tablet (200 mg total) into feeding tube daily. 12/03/22   Elgergawy, Leana Roe, MD  amLODipine (NORVASC) 10 MG tablet Place 1 tablet (10 mg total) into feeding tube daily. 03/06/23   Marinda Elk, MD  ascorbic acid (VITAMIN C) 500 MG tablet Place 1 tablet (500 mg total) into feeding tube 2 (two) times daily. 02/22/22   Glade Lloyd, MD  aspirin EC 81 MG tablet 81 mg at bedtime. Via tube    [provider]  chlorhexidine (PERIDEX) 0.12 % solution Use as directed 15 mLs in the mouth or throat 2 (two) times daily.    [provider]  cyclobenzaprine (FLEXERIL) 5 MG tablet Place 2.5 mg into feeding tube in the morning and at bedtime. Hold If Sedated    [provider]  diazePAM (VALTOCO 10 MG DOSE) 10 MG/0.1ML LIQD Place 20 mg into the nose as needed for up to 3 days (intranasal Valtoco 20 mg (10 mg in each nostril) if she has any clinical seizure). 03/06/23 03/09/23  Marinda Elk, MD  famotidine (PEPCID) 40 MG/5ML suspension Place 40 mg into feeding tube daily. 03/25/22   [provider]  folic acid (FOLVITE) 800 MCG tablet Place 800 mcg into feeding tube daily.    [provider]  HYDROcodone-acetaminophen (NORCO/VICODIN) 5-325 MG tablet Place  1 tablet into feeding tube daily as needed for moderate pain. 03/06/23   Marinda Elk, MD  levETIRAcetam (KEPPRA) 100 MG/ML solution Place 1,000 mg into feeding tube 2 (two) times daily.    [provider]  losartan (COZAAR) 25 MG tablet Place 1 tablet (25 mg total) into feeding tube daily. 03/06/23   Marinda Elk, MD  Menthol, Topical Analgesic, (BIOFREEZE) 4 % GEL Apply 2 g topically 2 (two) times daily. Upper left arm    [provider]  metoprolol tartrate (LOPRESSOR) 25 MG tablet Place 1 tablet (25 mg total) into feeding tube 2 (two) times daily. Patient taking differently: Place 37.5 mg into feeding tube 2 (two) times daily. 12/03/22   Elgergawy, Leana Roe, MD  nutrition supplement, JUVEN, (JUVEN) PACK Place 1 packet into feeding tube 2 (two) times daily between meals. Patient not taking: Reported on 03/01/2023 12/03/22   Elgergawy, Leana Roe, MD  Nutritional Supplements (FEEDING SUPPLEMENT, JEVITY 1.2 CAL,) LIQD Place 1,000 mLs into feeding tube continuous. At 77ml/hr Patient not taking: Reported on 03/01/2023 02/22/22   Glade Lloyd, MD  Nutritional Supplements (FEEDING SUPPLEMENT, PROSOURCE TF,) liquid Place 45 mLs into feeding tube 3 (three) times daily. Patient not taking: Reported on 03/01/2023 02/22/22   Glade Lloyd, MD  phenytoin (DILANTIN) 125 MG/5ML suspension Place 4 mLs (100 mg total) into feeding tube 2 (two) times daily. PLEASE RESUME IN 1 WEEK ONLY AFTER CHECKING DILANTIN  LEVEL Patient taking differently: Place 75 mg into feeding tube 2 (two) times daily. 12/10/22   Elgergawy, Leana Roe, MD  polyethylene glycol (MIRALAX / GLYCOLAX) 17 g packet Place 34 g into feeding tube daily as needed for mild constipation. Patient not taking: Reported on 03/01/2023 02/22/22   Glade Lloyd, MD  senna-docusate (SENOKOT-S) 8.6-50 MG tablet Place 2 tablets into feeding tube 2 (two) times daily. 02/22/22   Glade Lloyd, MD  Simethicone 80 MG TABS 1 tablet (80 mg total) by PEG  Tube route in the morning and at bedtime. Patient taking differently: Give 80 mg by tube in the morning and at bedtime. Simethicone 80mg  chewable tablet 02/22/22   Glade Lloyd, MD  simvastatin (ZOCOR) 10 MG tablet Place 1 tablet (10 mg total) into feeding tube at bedtime. 02/22/22   Glade Lloyd, MD  sulfamethoxazole-trimethoprim (BACTRIM DS) 800-160 MG tablet Take 1 tablet by mouth every 12 (twelve) hours for 3 days. 03/06/23 03/09/23  Marinda Elk, MD  valproic acid (DEPAKENE) 250 MG/5ML solution Take 500-750 mg by mouth at bedtime. Give 500mg  three times daily and 750mg  at bedtime    [provider]  Zinc 50 MG TABS Give 50 mg by tube daily.    [provider]      Allergies    Tape, Demerol [meperidine hcl], Sulfonamide derivatives, Bacitracin-polymyxin b, and Oxycontin [oxycodone]    Review of Systems   Review of Systems  Physical Exam Updated Vital Signs Temp 97.8 F (36.6 C) (Axillary)   SpO2 97%  Physical Exam  ED Results / Procedures / Treatments   Labs (all labs ordered are listed, but only abnormal results are displayed) Labs Reviewed  CULTURE, BLOOD (ROUTINE X 2)  CULTURE, BLOOD (ROUTINE X 2)  CBC WITH DIFFERENTIAL/PLATELET  URINALYSIS, ROUTINE W REFLEX MICROSCOPIC  COMPREHENSIVE METABOLIC PANEL  MAGNESIUM  RAPID URINE DRUG SCREEN, HOSP PERFORMED  CBG MONITORING, ED  I-STAT VENOUS BLOOD GAS, ED    EKG None  Radiology No results found.  Procedures Procedures  {Document cardiac monitor, telemetry assessment procedure when appropriate:1}  Medications Ordered in ED Medications - No data to display  ED Course/ Medical Decision Making/ A&P   {   Click here for ABCD2, HEART and other calculatorsREFRESH Note before signing :1}                              Medical Decision Making Amount and/or Complexity of Data Reviewed Labs: ordered. Radiology: ordered.   ***  {Document critical care time when appropriate:1} {Document  review of labs and clinical decision tools ie heart score, Chads2Vasc2 etc:1}  {Document your independent review of radiology images, and any outside records:1} {Document your discussion with family members, caretakers, and with consultants:1} {Document social determinants of health affecting pt's care:1} {Document your decision making why or why not admission, treatments were needed:1} Final Clinical Impression(s) / ED Diagnoses Final diagnoses:  None    Rx / DC Orders ED Discharge Orders     None

## 2023-03-09 NOTE — ED Triage Notes (Signed)
Pt BIB [facility] from [facility]. Staff reports pt has seizure around 2045, and has been post lctal since. Pt responding to pain and voice.   5mg  Versed IM  in route

## 2023-03-10 ENCOUNTER — Other Ambulatory Visit (HOSPITAL_COMMUNITY): Payer: Self-pay | Admitting: Internal Medicine

## 2023-03-10 ENCOUNTER — Telehealth (HOSPITAL_COMMUNITY): Payer: Self-pay

## 2023-03-10 DIAGNOSIS — Z934 Other artificial openings of gastrointestinal tract status: Secondary | ICD-10-CM

## 2023-03-10 LAB — CBG MONITORING, ED
Glucose-Capillary: 105 mg/dL — ABNORMAL HIGH (ref 70–99)
Glucose-Capillary: 108 mg/dL — ABNORMAL HIGH (ref 70–99)
Glucose-Capillary: 78 mg/dL (ref 70–99)
Glucose-Capillary: 87 mg/dL (ref 70–99)
Glucose-Capillary: 90 mg/dL (ref 70–99)

## 2023-03-10 LAB — VALPROIC ACID LEVEL: Valproic Acid Lvl: 29 ug/mL — ABNORMAL LOW (ref 50.0–100.0)

## 2023-03-10 LAB — PHENYTOIN LEVEL, TOTAL: Phenytoin Lvl: <2.5 ug/mL — ABNORMAL LOW (ref 10.0–20.0)

## 2023-03-10 MED ORDER — SODIUM CHLORIDE 0.9 % IV BOLUS
1000.0000 mL | Freq: Once | INTRAVENOUS | Status: AC
  Administered 2023-03-10: 1000 mL via INTRAVENOUS

## 2023-03-10 MED ORDER — DEXTROSE 50 % IV SOLN
INTRAVENOUS | Status: AC
  Filled 2023-03-10: qty 50

## 2023-03-10 MED ORDER — VALPROATE SODIUM 100 MG/ML IV SOLN
1000.0000 mg | Freq: Once | INTRAVENOUS | Status: AC
  Administered 2023-03-10: 1000 mg via INTRAVENOUS
  Filled 2023-03-10: qty 10

## 2023-03-10 MED ORDER — DEXTROSE 50 % IV SOLN
1.0000 | Freq: Once | INTRAVENOUS | Status: AC
  Administered 2023-03-10: 50 mL via INTRAVENOUS
  Filled 2023-03-10: qty 50

## 2023-03-10 MED ORDER — SODIUM CHLORIDE 0.9 % IV SOLN
1500.0000 mg | Freq: Once | INTRAVENOUS | Status: AC
  Administered 2023-03-10: 1500 mg via INTRAVENOUS
  Filled 2023-03-10: qty 30

## 2023-03-10 NOTE — ED Provider Notes (Signed)
[facility] EMERGENCY DEPARTMENT AT [facility] Provider Note   CSN: [id number] Arrival date & time: 03/09/23  2136     History  Chief Complaint  Patient presents with   Seizures    Kathleen Jensen is a 72 y.o. female who presents from [facility] with concern for generalized seizure event a 40 5 PM.  At baseline patient is verbal only with incomprehensible speech.,  Per facility at baseline.  Patient with history of seizure disorder, 12 to be on Keppra, valproic acid and phenytoin.  Chronic indwelling Foley.  Per documentation from facility it appears they have stopped her phenytoin 72 hours ago for unclear reason.  No falls.  Received 5 of IM Versed and route with EMS.  Patient with feeding tube in place.  HPI     Home Medications Prior to Admission medications   Medication Sig Start Date End Date Taking? Authorizing Provider  acetaminophen (TYLENOL) 160 MG/5ML solution Place 20 mLs (640 mg total) into feeding tube every 8 (eight) hours. Patient taking differently: Place 800 mg into feeding tube every 6 (six) hours. 12/03/22   Elgergawy, Leana Roe, MD  amiodarone (PACERONE) 200 MG tablet Place 1 tablet (200 mg total) into feeding tube daily. 12/03/22   Elgergawy, Leana Roe, MD  amLODipine (NORVASC) 10 MG tablet Place 1 tablet (10 mg total) into feeding tube daily. 03/06/23   Marinda Elk, MD  ascorbic acid (VITAMIN C) 500 MG tablet Place 1 tablet (500 mg total) into feeding tube 2 (two) times daily. 02/22/22   Glade Lloyd, MD  aspirin EC 81 MG tablet 81 mg at bedtime. Via tube    [provider]  chlorhexidine (PERIDEX) 0.12 % solution Use as directed 15 mLs in the mouth or throat 2 (two) times daily.    [provider]  cyclobenzaprine (FLEXERIL) 5 MG tablet Place 2.5 mg into feeding tube in the morning and at bedtime. Hold If Sedated    [provider]  diazePAM (VALTOCO 10 MG DOSE) 10 MG/0.1ML LIQD Place 20 mg into the nose as  needed for up to 3 days (intranasal Valtoco 20 mg (10 mg in each nostril) if she has any clinical seizure). 03/06/23 03/09/23  Marinda Elk, MD  famotidine (PEPCID) 40 MG/5ML suspension Place 40 mg into feeding tube daily. 03/25/22   [provider]  folic acid (FOLVITE) 800 MCG tablet Place 800 mcg into feeding tube daily.    [provider]  HYDROcodone-acetaminophen (NORCO/VICODIN) 5-325 MG tablet Place 1 tablet into feeding tube daily as needed for moderate pain. 03/06/23   Marinda Elk, MD  levETIRAcetam (KEPPRA) 100 MG/ML solution Place 1,000 mg into feeding tube 2 (two) times daily.    [provider]  losartan (COZAAR) 25 MG tablet Place 1 tablet (25 mg total) into feeding tube daily. 03/06/23   Marinda Elk, MD  Menthol, Topical Analgesic, (BIOFREEZE) 4 % GEL Apply 2 g topically 2 (two) times daily. Upper left arm    [provider]  metoprolol tartrate (LOPRESSOR) 25 MG tablet Place 1 tablet (25 mg total) into feeding tube 2 (two) times daily. Patient taking differently: Place 37.5 mg into feeding tube 2 (two) times daily. 12/03/22   Elgergawy, Leana Roe, MD  nutrition supplement, JUVEN, (JUVEN) PACK Place 1 packet into feeding tube 2 (two) times daily between meals. Patient not taking: Reported on 03/01/2023 12/03/22   Elgergawy, Leana Roe, MD  Nutritional Supplements (FEEDING SUPPLEMENT, JEVITY 1.2 CAL,) LIQD  Place 1,000 mLs into feeding tube continuous. At 13ml/hr Patient not taking: Reported on 03/01/2023 02/22/22   Glade Lloyd, MD  Nutritional Supplements (FEEDING SUPPLEMENT, PROSOURCE TF,) liquid Place 45 mLs into feeding tube 3 (three) times daily. Patient not taking: Reported on 03/01/2023 02/22/22   Glade Lloyd, MD  phenytoin (DILANTIN) 125 MG/5ML suspension Place 4 mLs (100 mg total) into feeding tube 2 (two) times daily. PLEASE RESUME IN 1 WEEK ONLY AFTER CHECKING DILANTIN LEVEL Patient taking differently: Place 75 mg into feeding tube  2 (two) times daily. 12/10/22   Elgergawy, Leana Roe, MD  polyethylene glycol (MIRALAX / GLYCOLAX) 17 g packet Place 34 g into feeding tube daily as needed for mild constipation. Patient not taking: Reported on 03/01/2023 02/22/22   Glade Lloyd, MD  senna-docusate (SENOKOT-S) 8.6-50 MG tablet Place 2 tablets into feeding tube 2 (two) times daily. 02/22/22   Glade Lloyd, MD  Simethicone 80 MG TABS 1 tablet (80 mg total) by PEG Tube route in the morning and at bedtime. Patient taking differently: Give 80 mg by tube in the morning and at bedtime. Simethicone 80mg  chewable tablet 02/22/22   Glade Lloyd, MD  simvastatin (ZOCOR) 10 MG tablet Place 1 tablet (10 mg total) into feeding tube at bedtime. 02/22/22   Glade Lloyd, MD  valproic acid (DEPAKENE) 250 MG/5ML solution Take 500-750 mg by mouth at bedtime. Give 500mg  three times daily and 750mg  at bedtime    [provider]  Zinc 50 MG TABS Give 50 mg by tube daily.    [provider]      Allergies    Tape, Demerol [meperidine hcl], Sulfonamide derivatives, Bacitracin-polymyxin b, and Oxycontin [oxycodone]    Review of Systems   Review of Systems  Unable to perform ROS: Patient nonverbal    Physical Exam Updated Vital Signs BP (!) 136/96   Pulse 77   Temp 98.8 F (37.1 C) (Axillary)   Resp (!) 23   SpO2 99%  Physical Exam Vitals and nursing note reviewed.  Constitutional:      Appearance: She is obese. She is not ill-appearing or toxic-appearing.  HENT:     Head: Normocephalic and atraumatic.     Mouth/Throat:     Mouth: Mucous membranes are moist.     Pharynx: No oropharyngeal exudate or posterior oropharyngeal erythema.  Eyes:     General:        Right eye: No discharge.        Left eye: No discharge.     Extraocular Movements: Extraocular movements intact.     Conjunctiva/sclera: Conjunctivae normal.     Pupils: Pupils are equal, round, and reactive to light.  Cardiovascular:     Rate and Rhythm:  Normal rate and regular rhythm.     Pulses: Normal pulses.     Heart sounds: No murmur heard. Pulmonary:     Effort: Pulmonary effort is normal. No respiratory distress.     Breath sounds: Normal breath sounds. No wheezing or rales.  Abdominal:     General: Bowel sounds are normal. There is no distension.     Palpations: Abdomen is soft.     Tenderness: There is no abdominal tenderness. There is no right CVA tenderness, left CVA tenderness or guarding.  Genitourinary:    Comments: Foley in place with clear yellow urine in the bag. Musculoskeletal:        General: No deformity.     Cervical back: Neck supple.     Right lower  leg: No edema.     Left lower leg: No edema.  Skin:    General: Skin is warm and dry.     Capillary Refill: Capillary refill takes less than 2 seconds.  Neurological:     General: No focal deficit present.     Mental Status: She is alert and oriented to person, place, and time. Mental status is at baseline.  Psychiatric:        Mood and Affect: Mood normal.     ED Results / Procedures / Treatments   Labs (all labs ordered are listed, but only abnormal results are displayed) Labs Reviewed  CBC WITH DIFFERENTIAL/PLATELET - Abnormal; Notable for the following components:      Result Value   RDW 17.6 (*)    Platelets 149 (*)    All other components within normal limits  URINALYSIS, ROUTINE W REFLEX MICROSCOPIC - Abnormal; Notable for the following components:   Leukocytes,Ua TRACE (*)    Bacteria, UA RARE (*)    All other components within normal limits  COMPREHENSIVE METABOLIC PANEL - Abnormal; Notable for the following components:   Sodium 133 (*)    Glucose, Bld 66 (*)    Calcium 8.1 (*)    Total Protein 8.4 (*)    Albumin 2.1 (*)    AST 62 (*)    Alkaline Phosphatase 142 (*)    All other components within normal limits  RAPID URINE DRUG SCREEN, HOSP PERFORMED - Abnormal; Notable for the following components:   Benzodiazepines POSITIVE (*)    All  other components within normal limits  VALPROIC ACID LEVEL - Abnormal; Notable for the following components:   Valproic Acid Lvl 29 (*)    All other components within normal limits  PHENYTOIN LEVEL, TOTAL - Abnormal; Notable for the following components:   Phenytoin Lvl <2.5 (*)    All other components within normal limits  I-STAT VENOUS BLOOD GAS, ED - Abnormal; Notable for the following components:   pO2, Ven 107 (*)    Bicarbonate 30.1 (*)    Acid-Base Excess 4.0 (*)    Calcium, Ion 1.11 (*)    All other components within normal limits  CBG MONITORING, ED - Abnormal; Notable for the following components:   Glucose-Capillary 45 (*)    All other components within normal limits  CBG MONITORING, ED - Abnormal; Notable for the following components:   Glucose-Capillary 108 (*)    All other components within normal limits  CULTURE, BLOOD (ROUTINE X 2)  CULTURE, BLOOD (ROUTINE X 2)  MAGNESIUM  CBG MONITORING, ED  CBG MONITORING, ED  CBG MONITORING, ED  CBG MONITORING, ED    EKG EKG Interpretation Date/Time:  Sunday March 09 2023 21:44:27 EDT Ventricular Rate:  92 PR Interval:  190 QRS Duration:  82 QT Interval:  356 QTC Calculation: 441 R Axis:   -19  Text Interpretation: Sinus rhythm Abnormal R-wave progression, early transition Left ventricular hypertrophy Confirmed by Glyn Ade [phone]) on 03/09/2023 10:26:37 PM  Radiology CT HEAD WO CONTRAST ( )  Result Date: 03/09/2023 CLINICAL DATA:  Head trauma. EXAM: CT HEAD WITHOUT CONTRAST TECHNIQUE: Contiguous axial images were obtained from the base of the skull through the vertex without intravenous contrast. RADIATION DOSE REDUCTION: This exam was performed according to the departmental dose-optimization program which includes automated exposure control, adjustment of the mA and/or kV according to patient size and/or use of iterative reconstruction technique. COMPARISON:  Head CT dated 02/28/2023. FINDINGS: Evaluation of  this exam is  limited due to motion artifact. Brain: Moderate age-related atrophy and chronic microvascular ischemic changes. There is no acute intracranial hemorrhage. No mass effect or midline shift. No extra-axial fluid collection. Vascular: No hyperdense vessel or unexpected calcification. Skull: Normal. Negative for fracture or focal lesion. Sinuses/Orbits: No acute finding. Other: None IMPRESSION: 1. No acute intracranial pathology. 2. Moderate age-related atrophy and chronic microvascular ischemic changes. Electronically Signed   By: Elgie Collard M.D.   On: 03/09/2023 23:02   DG Chest Port 1 View  Result Date: 03/09/2023 CLINICAL DATA:  [id number] AMS (altered mental status) [id number] EXAM: PORTABLE CHEST 1 VIEW COMPARISON:  Chest x-ray 02/28/2023 FINDINGS: Left chest wall dual lead pacemaker. The heart and mediastinal contours are unchanged. Aortic calcification. Low lung volumes. No focal consolidation. No pulmonary edema. No pleural effusion. No pneumothorax. No acute osseous abnormality. IMPRESSION: Low lung volumes no active disease. Electronically Signed   By: Tish Frederickson M.D.   On: 03/09/2023 22:43    Procedures Procedures    Medications Ordered in ED Medications  dextrose 50 % solution (has no administration in time range)  valproate (DEPACON) 1,000 mg in dextrose 5 % 50 mL IVPB (has no administration in time range)  dextrose 50 % solution 50 mL (50 mLs Intravenous Given 03/09/23 2356)  sodium chloride 0.9 % bolus 1,000 mL (0 mLs Intravenous Stopped 03/10/23 0228)  dextrose 50 % solution 50 mL (50 mLs Intravenous Given 03/10/23 0130)  phenytoin (DILANTIN) 1,500 mg in sodium chloride 0.9 % 250 mL IVPB (1,500 mg Intravenous New Bag/Given 03/10/23 0601)    ED Course/ Medical Decision Making/ A&P Clinical Course as of 03/10/23 0645  Mon Mar 10, 2023  0520 Case discussed with pharmacist Tammy Sours for recommendations for loading doses of phenytoin and valproic acid.  Recommends 1500 mg of  phenytoin and 1000 mg of valproic acid loading dose.  They appreciate his collaboration care of this patient. [RS]    Clinical Course User Index [RS] , Eugene Gavia, PA-C                                 Medical Decision Making 72 y/o female with seizure, hx of same.   HTN, tachypneic on intake. VS otherwise normal. Cardiopulmonary exam is normal, abdominal exam is benign. Foley catheter in place. At neurologic baseline.   Differential includes insufficient concentration of anticonvulsive medications, metabolic derangement, hyper/hypoglycemia.  Amount and/or Complexity of Data Reviewed Labs: ordered.    Details: CBC without leukocytosis, thrombocytopenia of 149.  CMP hyponatremia 133, elevation in AST to 62.  UDS positive for benzos, administer Valium and route.  UA negative for infection.  VBG without acidosis.  Phenytoin and valproic acid level subtherapeutic.   Radiology: ordered.    Details: Chest x-ray negative for acute cardiopulmonary disease, CT head negative for acute cranial abnormality. ECG/medicine tests:     Details:   EKG with sinus rhythm without ischemic changes.    Risk Prescription drug management.   Patient is at her baseline, did have a few episodes of hypoglycemia requiring amps of D50, but was administered juice through her feeding tube with resolution of hypoglycemia.  Patient loaded with phenytoin and valproate per pharmacy recommendations.  Should be initiated back on phenytoin at her facility and valproic acid dosage to be reviewed by her providers.  Patient without recurrent seizure activity in the ED.  Suspect breakthrough seizure secondary to recent discontinuation of phenytoin.  Clinical concern  for emergent underlying etiology that would warrant further ED workup or inpatient management is exceedingly low.  Patient pending completion of loading doses of anticonvulsant medications as well as transport back to her facility at time of shift change.   Morning ED team informed of patient's ED course and will be available for the patient until time of discharge.  Unable to contact any providers from [facility].   This chart was dictated using voice recognition software, Dragon. Despite the best efforts of this provider to proofread and correct errors, errors may still occur which can change documentation meaning.  Final Clinical Impression(s) / ED Diagnoses Final diagnoses:  None    Rx / DC Orders ED Discharge Orders     None         Sherrilee Gilles 03/10/23 0645    Glynn Octave, MD 03/11/23 [phone]

## 2023-03-10 NOTE — Telephone Encounter (Signed)
Called [name] to schedule j-tube replacement, no answer, no vm. AB

## 2023-03-10 NOTE — Discharge Instructions (Signed)
Patient should be initiated back on her phenytoin and dosage of her valproic acid and Keppra should be reevaluated to prevent breakthrough seizures.  Return to the ER with any new severe symptoms.

## 2023-03-14 ENCOUNTER — Ambulatory Visit (HOSPITAL_COMMUNITY): Payer: Medicare HMO

## 2023-03-14 ENCOUNTER — Encounter (HOSPITAL_COMMUNITY): Payer: Self-pay

## 2023-04-30 ENCOUNTER — Encounter (HOSPITAL_COMMUNITY): Payer: Self-pay

## 2023-04-30 ENCOUNTER — Emergency Department (HOSPITAL_COMMUNITY)
Admission: EM | Admit: 2023-04-30 | Discharge: 2023-04-30 | Disposition: A | Discharge status: Skilled Nursing Facility | Payer: Medicare HMO | Attending: Emergency Medicine | Admitting: Emergency Medicine

## 2023-04-30 ENCOUNTER — Emergency Department (HOSPITAL_COMMUNITY): Payer: Medicare HMO

## 2023-04-30 ENCOUNTER — Other Ambulatory Visit: Payer: Self-pay

## 2023-04-30 DIAGNOSIS — K9423 Gastrostomy malfunction: Secondary | ICD-10-CM | POA: Diagnosis present

## 2023-04-30 DIAGNOSIS — I69320 Aphasia following cerebral infarction: Secondary | ICD-10-CM | POA: Diagnosis not present

## 2023-04-30 DIAGNOSIS — T85528A Displacement of other gastrointestinal prosthetic devices, implants and grafts, initial encounter: Secondary | ICD-10-CM

## 2023-04-30 HISTORY — PX: IR REPLC DUODEN/JEJUNO TUBE PERCUT W/FLUORO: IMG2334

## 2023-04-30 MED ORDER — IOHEXOL 300 MG/ML  SOLN
50.0000 mL | Freq: Once | INTRAMUSCULAR | Status: AC | PRN
  Administered 2023-04-30: 30 mL

## 2023-04-30 MED ORDER — LIDOCAINE VISCOUS HCL 2 % MT SOLN
OROMUCOSAL | Status: AC
  Filled 2023-04-30: qty 15

## 2023-04-30 MED ORDER — CHLORHEXIDINE GLUCONATE 4 % EX SOLN
CUTANEOUS | Status: AC
  Filled 2023-04-30: qty 15

## 2023-04-30 NOTE — ED Triage Notes (Signed)
Pt from NH. J tube dislodged. NH staff placed foley cath in abdominal outlet to keep access open.

## 2023-04-30 NOTE — ED Provider Notes (Signed)
Patient had J-tube replaced by IR and will be discharged   Lorre Nick, MD 04/30/23 1242

## 2023-04-30 NOTE — ED Notes (Signed)
Pt back from IR, NAD noted at this time.

## 2023-04-30 NOTE — ED Notes (Signed)
PTAR scheduled for the patient.  ETA-not very long.

## 2023-04-30 NOTE — ED Provider Notes (Signed)
[place] [facility]ERGENCY DEPARTMENT AT [facility] Provider Note   CSN: [id number] Arrival date & time: 04/30/23  0531     History  Chief Complaint  Patient presents with   GI Problem    Kathleen Jensen is a 72 y.o. female.  HPI     This is a 72 year old female who presents with concern for J-tube dislodgment.  Per report her J-tube was dislodged at her facility.  The facility placed a Foley as a Print production planner.  Patient denies any pain.  Unsure when the J-tube came out.  I have reviewed the patient's chart.  Previously her J-tube was replaced by IR on 8/9.  Patient has no other complaints at this time.  History of prior CVA and expressive aphasia.  Limited history.  Home Medications Prior to Admission medications   Medication Sig Start Date End Date Taking? Authorizing Provider  acetaminophen (TYLENOL) 160 MG/5ML solution Place 20 mLs (640 mg total) into feeding tube every 8 (eight) hours. Patient taking differently: Place 800 mg into feeding tube every 6 (six) hours. 12/03/22   Elgergawy, Leana Roe, MD  amiodarone (PACERONE) 200 MG tablet Place 1 tablet (200 mg total) into feeding tube daily. 12/03/22   Elgergawy, Leana Roe, MD  amLODipine (NORVASC) 10 MG tablet Place 1 tablet (10 mg total) into feeding tube daily. 03/06/23   Marinda Elk, MD  ascorbic acid (VITAMIN C) 500 MG tablet Place 1 tablet (500 mg total) into feeding tube 2 (two) times daily. 02/22/22   Glade Lloyd, MD  aspirin EC 81 MG tablet 81 mg at bedtime. Via tube    [provider]  chlorhexidine (PERIDEX) 0.12 % solution Use as directed 15 mLs in the mouth or throat 2 (two) times daily.    [provider]  cyclobenzaprine (FLEXERIL) 5 MG tablet Place 2.5 mg into feeding tube in the morning and at bedtime. Hold If Sedated    [provider]  diazePAM (VALTOCO 10 MG DOSE) 10 MG/0.1ML LIQD Place 20 mg into the nose as needed for up to 3 days (intranasal Valtoco 20 mg (10 mg in  each nostril) if she has any clinical seizure). 03/06/23 03/09/23  Marinda Elk, MD  famotidine (PEPCID) 40 MG/5ML suspension Place 40 mg into feeding tube daily. 03/25/22   [provider]  folic acid (FOLVITE) 800 MCG tablet Place 800 mcg into feeding tube daily.    [provider]  HYDROcodone-acetaminophen (NORCO/VICODIN) 5-325 MG tablet Place 1 tablet into feeding tube daily as needed for moderate pain. 03/06/23   Marinda Elk, MD  levETIRAcetam (KEPPRA) 100 MG/ML solution Place 1,000 mg into feeding tube 2 (two) times daily.    [provider]  losartan (COZAAR) 25 MG tablet Place 1 tablet (25 mg total) into feeding tube daily. 03/06/23   Marinda Elk, MD  Menthol, Topical Analgesic, (BIOFREEZE) 4 % GEL Apply 2 g topically 2 (two) times daily. Upper left arm    [provider]  metoprolol tartrate (LOPRESSOR) 25 MG tablet Place 1 tablet (25 mg total) into feeding tube 2 (two) times daily. Patient taking differently: Place 37.5 mg into feeding tube 2 (two) times daily. 12/03/22   Elgergawy, Leana Roe, MD  nutrition supplement, JUVEN, (JUVEN) PACK Place 1 packet into feeding tube 2 (two) times daily between meals. Patient not taking: Reported on 03/01/2023 12/03/22   Elgergawy, Leana Roe, MD  Nutritional Supplements (FEEDING SUPPLEMENT, JEVITY 1.2 CAL,) LIQD Place 1,000 mLs into feeding tube  continuous. At 43ml/hr Patient not taking: Reported on 03/01/2023 02/22/22   Glade Lloyd, MD  Nutritional Supplements (FEEDING SUPPLEMENT, PROSOURCE TF,) liquid Place 45 mLs into feeding tube 3 (three) times daily. Patient not taking: Reported on 03/01/2023 02/22/22   Glade Lloyd, MD  phenytoin (DILANTIN) 125 MG/5ML suspension Place 4 mLs (100 mg total) into feeding tube 2 (two) times daily. PLEASE RESUME IN 1 WEEK ONLY AFTER CHECKING DILANTIN LEVEL Patient taking differently: Place 75 mg into feeding tube 2 (two) times daily. 12/10/22   Elgergawy, Leana Roe, MD   polyethylene glycol (MIRALAX / GLYCOLAX) 17 g packet Place 34 g into feeding tube daily as needed for mild constipation. Patient not taking: Reported on 03/01/2023 02/22/22   Glade Lloyd, MD  senna-docusate (SENOKOT-S) 8.6-50 MG tablet Place 2 tablets into feeding tube 2 (two) times daily. 02/22/22   Glade Lloyd, MD  Simethicone 80 MG TABS 1 tablet (80 mg total) by PEG Tube route in the morning and at bedtime. Patient taking differently: Give 80 mg by tube in the morning and at bedtime. Simethicone 80mg  chewable tablet 02/22/22   Glade Lloyd, MD  simvastatin (ZOCOR) 10 MG tablet Place 1 tablet (10 mg total) into feeding tube at bedtime. 02/22/22   Glade Lloyd, MD  valproic acid (DEPAKENE) 250 MG/5ML solution Take 500-750 mg by mouth at bedtime. Give 500mg  three times daily and 750mg  at bedtime    [provider]  Zinc 50 MG TABS Give 50 mg by tube daily.    [provider]      Allergies    Tape, Demerol [meperidine hcl], Sulfonamide derivatives, Bacitracin-polymyxin b, and Oxycontin [oxycodone]    Review of Systems   Review of Systems  Constitutional:  Negative for fever.  Gastrointestinal:  Negative for abdominal pain.  All other systems reviewed and are negative.   Physical Exam Updated Vital Signs BP (!) 170/96 (BP Location: Left Arm)   Pulse 62   Temp 97.8 F (36.6 C) (Oral)   Resp (!) 22   Ht 1.651 m (5\' 5" )   Wt 98 kg   SpO2 94%   BMI 35.95 kg/m  Physical Exam Vitals and nursing note reviewed.  Constitutional:      Appearance: She is well-developed.     Comments: Chronically ill-appearing but nontoxic  HENT:     Head: Normocephalic and atraumatic.     Nose: Nose normal.  Eyes:     Pupils: Pupils are equal, round, and reactive to light.  Cardiovascular:     Rate and Rhythm: Normal rate and regular rhythm.  Pulmonary:     Effort: Pulmonary effort is normal. No respiratory distress.     Breath sounds: No wheezing.  Abdominal:     General:  There is distension.     Palpations: Abdomen is soft.     Tenderness: There is no abdominal tenderness. There is no guarding or rebound.     Comments: Focally projecting from the left upper abdomen  Musculoskeletal:     Cervical back: Neck supple.  Skin:    General: Skin is warm and dry.  Neurological:     Mental Status: She is alert.     Comments: At patient's baseline  Psychiatric:        Mood and Affect: Mood normal.     ED Results / Procedures / Treatments   Labs (all labs ordered are listed, but only abnormal results are displayed) Labs Reviewed - No data to display  EKG None  Radiology  No results found.  Procedures Procedures    Medications Ordered in ED Medications - No data to display  ED Course/ Medical Decision Making/ A&P                                 Medical Decision Making  This patient presents to the ED for concern of J-tube dislodgment, this involves an extensive number of treatment options, and is a complaint that carries with it a high risk of complications and morbidity.  I considered the following differential and admission for this acute, potentially life threatening condition.  The differential diagnosis includes J-tube dislodgment, malfunction  MDM:    This is a 72 year old female who presents with concern for J-tube dislodgment.  Does not have any other complaints at this time although her history is limited secondary to aphasia.  She is nontoxic.  Vital signs notable for blood pressure 170/96.  She is slightly distended abdomen without signs of tenderness.  There is a Foley in place at the abdominal outlet site.  Per nursing it was draining some GI contents.  Given that this is a J-tube, this is best replaced under fluoroscopy.  Replacement with IR ordered.  (Labs, imaging, consults)  Labs: I Ordered, and personally interpreted labs.  The pertinent results include: None  Imaging Studies ordered: I ordered imaging studies including none I  independently visualized and interpreted imaging. I agree with the radiologist interpretation  Additional history obtained from chart review.  External records from outside source obtained and reviewed including prior evaluations  Cardiac Monitoring: The patient was not maintained on a cardiac monitor.  If on the cardiac monitor, I personally viewed and interpreted the cardiac monitored which showed an underlying rhythm of: N/A  Reevaluation: After the interventions noted above, I reevaluated the patient and found that they have :stayed the same  Social Determinants of Health:  lives at facility  Disposition: Pending IR replacement of J-tube  Co morbidities that complicate the patient evaluation  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   I have reviewed the patients home medicines and have made adjustments as needed  Problem List / ED Course: Problem List Items Addressed This Visit   None               Final Clinical Impression(s) / ED Diagnoses Final diagnoses:  None    Rx / DC Orders ED Discharge Orders     None         Aleksei Goodlin, Mayer Masker, MD 04/30/23 [phone]

## 2023-04-30 NOTE — ED Notes (Signed)
PT to IR, NAD upon transport.

## 2023-04-30 NOTE — Discharge Instructions (Signed)
Follow-up with your doctor as needed.

## 2023-04-30 NOTE — Procedures (Signed)
Pre procedural Dx: Inadvertent removal of J-tube. Post procedural Dx: Same  Successful fluoroscopic guided replacement of existing J-tube. The feeding tube is ready for immediate use.  EBL: None Complications: None immediate.  Katherina Right, MD Pager #: [phone]

## 2023-04-30 NOTE — ED Notes (Signed)
Per chart pts feeding tube is a size 16Fr J tube.

## 2023-06-06 ENCOUNTER — Other Ambulatory Visit: Payer: Self-pay

## 2023-06-06 ENCOUNTER — Emergency Department (HOSPITAL_COMMUNITY): Payer: Medicare HMO

## 2023-06-06 ENCOUNTER — Observation Stay (HOSPITAL_COMMUNITY)
Admission: EM | Admit: 2023-06-06 | Discharge: 2023-06-09 | Disposition: A | Discharge status: Skilled Nursing Facility | Payer: Medicare HMO | Attending: Internal Medicine | Admitting: Internal Medicine

## 2023-06-06 ENCOUNTER — Observation Stay (HOSPITAL_COMMUNITY): Payer: Medicare HMO

## 2023-06-06 ENCOUNTER — Encounter (HOSPITAL_COMMUNITY): Payer: Self-pay | Admitting: Emergency Medicine

## 2023-06-06 DIAGNOSIS — J45909 Unspecified asthma, uncomplicated: Secondary | ICD-10-CM | POA: Diagnosis not present

## 2023-06-06 DIAGNOSIS — E1122 Type 2 diabetes mellitus with diabetic chronic kidney disease: Secondary | ICD-10-CM | POA: Insufficient documentation

## 2023-06-06 DIAGNOSIS — Z8673 Personal history of transient ischemic attack (TIA), and cerebral infarction without residual deficits: Secondary | ICD-10-CM | POA: Diagnosis not present

## 2023-06-06 DIAGNOSIS — E039 Hypothyroidism, unspecified: Secondary | ICD-10-CM | POA: Diagnosis not present

## 2023-06-06 DIAGNOSIS — K567 Ileus, unspecified: Secondary | ICD-10-CM | POA: Diagnosis not present

## 2023-06-06 DIAGNOSIS — L8990 Pressure ulcer of unspecified site, unspecified stage: Secondary | ICD-10-CM | POA: Diagnosis not present

## 2023-06-06 DIAGNOSIS — N189 Chronic kidney disease, unspecified: Secondary | ICD-10-CM | POA: Insufficient documentation

## 2023-06-06 DIAGNOSIS — Z950 Presence of cardiac pacemaker: Secondary | ICD-10-CM | POA: Diagnosis not present

## 2023-06-06 DIAGNOSIS — E785 Hyperlipidemia, unspecified: Secondary | ICD-10-CM | POA: Diagnosis present

## 2023-06-06 DIAGNOSIS — R140 Abdominal distension (gaseous): Secondary | ICD-10-CM | POA: Diagnosis present

## 2023-06-06 DIAGNOSIS — E876 Hypokalemia: Secondary | ICD-10-CM | POA: Insufficient documentation

## 2023-06-06 DIAGNOSIS — E440 Moderate protein-calorie malnutrition: Secondary | ICD-10-CM | POA: Insufficient documentation

## 2023-06-06 DIAGNOSIS — L89159 Pressure ulcer of sacral region, unspecified stage: Secondary | ICD-10-CM | POA: Diagnosis not present

## 2023-06-06 DIAGNOSIS — Z931 Gastrostomy status: Secondary | ICD-10-CM

## 2023-06-06 DIAGNOSIS — Z79899 Other long term (current) drug therapy: Secondary | ICD-10-CM | POA: Insufficient documentation

## 2023-06-06 DIAGNOSIS — I129 Hypertensive chronic kidney disease with stage 1 through stage 4 chronic kidney disease, or unspecified chronic kidney disease: Secondary | ICD-10-CM | POA: Insufficient documentation

## 2023-06-06 DIAGNOSIS — I480 Paroxysmal atrial fibrillation: Secondary | ICD-10-CM | POA: Diagnosis not present

## 2023-06-06 DIAGNOSIS — D649 Anemia, unspecified: Secondary | ICD-10-CM | POA: Insufficient documentation

## 2023-06-06 DIAGNOSIS — Z87898 Personal history of other specified conditions: Secondary | ICD-10-CM

## 2023-06-06 DIAGNOSIS — G8191 Hemiplegia, unspecified affecting right dominant side: Secondary | ICD-10-CM

## 2023-06-06 DIAGNOSIS — S31000A Unspecified open wound of lower back and pelvis without penetration into retroperitoneum, initial encounter: Secondary | ICD-10-CM | POA: Diagnosis present

## 2023-06-06 LAB — FOLATE: Folate: 27.6 ng/mL (ref >5.9)

## 2023-06-06 LAB — TECHNOLOGIST SMEAR REVIEW

## 2023-06-06 LAB — CBC WITH DIFFERENTIAL/PLATELET
Abs Immature Granulocytes: 0.01 10*3/uL (ref 0.00–0.07)
Basophils Absolute: 0 10*3/uL (ref 0.0–0.1)
Basophils Relative: 0 %
Eosinophils Absolute: 0.2 10*3/uL (ref 0.0–0.5)
Eosinophils Relative: 3 %
HCT: 44.8 % (ref 36.0–46.0)
Hemoglobin: 14.6 g/dL (ref 12.0–15.0)
Immature Granulocytes: 0 %
Lymphocytes Relative: 52 %
Lymphs Abs: 3.6 10*3/uL (ref 0.7–4.0)
MCH: 33.1 pg (ref 26.0–34.0)
MCHC: 32.6 g/dL (ref 30.0–36.0)
MCV: 101.6 fL — ABNORMAL HIGH (ref 80.0–100.0)
Monocytes Absolute: 0.6 10*3/uL (ref 0.1–1.0)
Monocytes Relative: 9 %
Neutro Abs: 2.5 10*3/uL (ref 1.7–7.7)
Neutrophils Relative %: 36 %
Platelets: 131 10*3/uL — ABNORMAL LOW (ref 150–400)
RBC: 4.41 MIL/uL (ref 3.87–5.11)
RDW: 15.1 % (ref 11.5–15.5)
WBC: 6.9 10*3/uL (ref 4.0–10.5)
nRBC: 0 % (ref 0.0–0.2)

## 2023-06-06 LAB — COMPREHENSIVE METABOLIC PANEL
ALT: 32 U/L (ref 0–44)
AST: 70 U/L — ABNORMAL HIGH (ref 15–41)
Albumin: 2.6 g/dL — ABNORMAL LOW (ref 3.5–5.0)
Alkaline Phosphatase: 197 U/L — ABNORMAL HIGH (ref 38–126)
Anion gap: 6 (ref 5–15)
BUN: 35 mg/dL — ABNORMAL HIGH (ref 8–23)
CO2: 35 mmol/L — ABNORMAL HIGH (ref 22–32)
Calcium: 8.4 mg/dL — ABNORMAL LOW (ref 8.9–10.3)
Chloride: 96 mmol/L — ABNORMAL LOW (ref 98–111)
Creatinine, Ser: 0.6 mg/dL (ref 0.44–1.00)
GFR, Estimated: >60 mL/min (ref >60)
Glucose, Bld: 67 mg/dL — ABNORMAL LOW (ref 70–99)
Potassium: 3.6 mmol/L (ref 3.5–5.1)
Sodium: 137 mmol/L (ref 135–145)
Total Bilirubin: 0.5 mg/dL (ref <1.2)
Total Protein: 9.2 g/dL — ABNORMAL HIGH (ref 6.5–8.1)

## 2023-06-06 LAB — VITAMIN B12: Vitamin B-12: 1092 pg/mL — ABNORMAL HIGH (ref 180–914)

## 2023-06-06 LAB — TSH: TSH: 4.501 u[IU]/mL — ABNORMAL HIGH (ref 0.350–4.500)

## 2023-06-06 LAB — PHOSPHORUS: Phosphorus: 3.9 mg/dL (ref 2.5–4.6)

## 2023-06-06 LAB — LIPASE, BLOOD: Lipase: 51 U/L (ref 11–51)

## 2023-06-06 LAB — MAGNESIUM: Magnesium: 2.3 mg/dL (ref 1.7–2.4)

## 2023-06-06 MED ORDER — MENTHOL (TOPICAL ANALGESIC) 4 % EX GEL: 2.0000 g | CUTANEOUS | Status: DC

## 2023-06-06 MED ORDER — AMLODIPINE BESYLATE 10 MG PO TABS
10.0000 mg | ORAL_TABLET | Freq: Every day | ORAL | Status: DC
  Administered 2023-06-07 – 2023-06-09 (×3): 10 mg
  Filled 2023-06-06 (×3): qty 1

## 2023-06-06 MED ORDER — VALPROIC ACID 250 MG/5ML PO SOLN: 500.0000 mg | ORAL | Status: DC

## 2023-06-06 MED ORDER — TORSEMIDE 20 MG PO TABS
10.0000 mg | ORAL_TABLET | Freq: Every day | ORAL | Status: DC
  Administered 2023-06-07 – 2023-06-09 (×3): 10 mg
  Filled 2023-06-06 (×3): qty 1

## 2023-06-06 MED ORDER — SENNOSIDES-DOCUSATE SODIUM 8.6-50 MG PO TABS
2.0000 | ORAL_TABLET | Freq: Two times a day (BID) | ORAL | Status: DC
  Administered 2023-06-06 – 2023-06-09 (×6): 2
  Filled 2023-06-06 (×6): qty 2

## 2023-06-06 MED ORDER — METOPROLOL TARTRATE 12.5 MG HALF TABLET
37.5000 mg | ORAL_TABLET | Freq: Two times a day (BID) | ORAL | Status: DC
  Administered 2023-06-06 – 2023-06-09 (×6): 37.5 mg
  Filled 2023-06-06 (×6): qty 1

## 2023-06-06 MED ORDER — ZINC SULFATE 220 (50 ZN) MG PO CAPS: 220.0000 mg | ORAL_CAPSULE | Freq: Every day | ORAL | Status: DC

## 2023-06-06 MED ORDER — CRANBERRY 500 MG PO CAPS: 500.0000 mg | ORAL_CAPSULE | Freq: Two times a day (BID) | ORAL | Status: DC

## 2023-06-06 MED ORDER — ONDANSETRON HCL 4 MG PO TABS: 4.0000 mg | ORAL_TABLET | Freq: Four times a day (QID) | ORAL | Status: DC | PRN

## 2023-06-06 MED ORDER — ACETAMINOPHEN 325 MG PO TABS: 650.0000 mg | ORAL_TABLET | Freq: Four times a day (QID) | ORAL | Status: DC | PRN

## 2023-06-06 MED ORDER — ACETAMINOPHEN 650 MG RE SUPP: 650.0000 mg | Freq: Four times a day (QID) | RECTAL | Status: DC | PRN

## 2023-06-06 MED ORDER — PHENYTOIN 125 MG/5ML PO SUSP
25.0000 mg | Freq: Two times a day (BID) | ORAL | Status: DC
  Administered 2023-06-06 – 2023-06-09 (×6): 25 mg
  Filled 2023-06-06 (×7): qty 4

## 2023-06-06 MED ORDER — SIMVASTATIN 20 MG PO TABS
10.0000 mg | ORAL_TABLET | Freq: Every day | ORAL | Status: DC
  Administered 2023-06-06 – 2023-06-08 (×3): 10 mg
  Filled 2023-06-06 (×3): qty 1

## 2023-06-06 MED ORDER — AMIODARONE HCL 200 MG PO TABS
200.0000 mg | ORAL_TABLET | Freq: Every day | ORAL | Status: DC
  Administered 2023-06-07 – 2023-06-09 (×3): 200 mg
  Filled 2023-06-06 (×3): qty 1

## 2023-06-06 MED ORDER — ACETAMINOPHEN 160 MG/5ML PO SOLN
800.0000 mg | Freq: Four times a day (QID) | ORAL | Status: DC
  Administered 2023-06-06 – 2023-06-09 (×12): 800 mg
  Filled 2023-06-06 (×12): qty 40.6

## 2023-06-06 MED ORDER — LEVETIRACETAM 100 MG/ML PO SOLN
1000.0000 mg | Freq: Two times a day (BID) | ORAL | Status: DC
  Administered 2023-06-06 – 2023-06-09 (×6): 1000 mg
  Filled 2023-06-06 (×7): qty 10

## 2023-06-06 MED ORDER — ASPIRIN 81 MG PO CHEW
81.0000 mg | CHEWABLE_TABLET | Freq: Every day | ORAL | Status: DC
  Administered 2023-06-06 – 2023-06-08 (×3): 81 mg
  Filled 2023-06-06 (×3): qty 1

## 2023-06-06 MED ORDER — VITAMIN C 500 MG PO TABS
500.0000 mg | ORAL_TABLET | Freq: Two times a day (BID) | ORAL | Status: DC
  Administered 2023-06-06: 500 mg
  Filled 2023-06-06: qty 1

## 2023-06-06 MED ORDER — SODIUM CHLORIDE 0.9 % IV SOLN: INTRAVENOUS | Status: AC

## 2023-06-06 MED ORDER — FAMOTIDINE 40 MG/5ML PO SUSR
40.0000 mg | Freq: Every day | ORAL | Status: DC
  Administered 2023-06-07 – 2023-06-09 (×3): 40 mg
  Filled 2023-06-06 (×3): qty 5

## 2023-06-06 MED ORDER — ONDANSETRON HCL 4 MG/2ML IJ SOLN: 4.0000 mg | Freq: Four times a day (QID) | INTRAMUSCULAR | Status: DC | PRN

## 2023-06-06 MED ORDER — SIMETHICONE 80 MG PO CHEW
80.0000 mg | CHEWABLE_TABLET | Freq: Two times a day (BID) | ORAL | Status: DC
  Administered 2023-06-06 – 2023-06-09 (×6): 80 mg
  Filled 2023-06-06 (×7): qty 1

## 2023-06-06 MED ORDER — LOSARTAN POTASSIUM 25 MG PO TABS
25.0000 mg | ORAL_TABLET | Freq: Every day | ORAL | Status: DC
  Administered 2023-06-07 – 2023-06-09 (×3): 25 mg
  Filled 2023-06-06 (×3): qty 1

## 2023-06-06 MED ORDER — VALPROIC ACID 250 MG/5ML PO SOLN
750.0000 mg | Freq: Every day | ORAL | Status: DC
Start: 2023-06-06 — End: 2023-06-10
  Administered 2023-06-06 – 2023-06-08 (×3): 750 mg
  Filled 2023-06-06 (×4): qty 15

## 2023-06-06 MED ORDER — ZINC 50 MG PO TABS: 50.0000 mg | ORAL_TABLET | Freq: Every day | ORAL | Status: DC

## 2023-06-06 MED ORDER — SENNOSIDES-DOCUSATE SODIUM 8.6-50 MG PO TABS: 1.0000 | ORAL_TABLET | Freq: Every evening | ORAL | Status: DC | PRN

## 2023-06-06 MED ORDER — CYCLOBENZAPRINE HCL 5 MG PO TABS
2.5000 mg | ORAL_TABLET | Freq: Three times a day (TID) | ORAL | Status: DC
  Administered 2023-06-06 – 2023-06-09 (×9): 2.5 mg via JEJUNOSTOMY
  Filled 2023-06-06 (×9): qty 1

## 2023-06-06 MED ORDER — CHLORHEXIDINE GLUCONATE 0.12 % MT SOLN
15.0000 mL | Freq: Two times a day (BID) | OROMUCOSAL | Status: DC
  Administered 2023-06-06 – 2023-06-09 (×6): 15 mL via OROMUCOSAL
  Filled 2023-06-06 (×6): qty 15

## 2023-06-06 MED ORDER — FOLIC ACID 1 MG PO TABS
1.0000 mg | ORAL_TABLET | Freq: Every day | ORAL | Status: DC
  Administered 2023-06-07 – 2023-06-09 (×3): 1 mg
  Filled 2023-06-06 (×3): qty 1

## 2023-06-06 MED ORDER — MUSCLE RUB 10-15 % EX CREA
TOPICAL_CREAM | Freq: Two times a day (BID) | CUTANEOUS | Status: DC
  Filled 2023-06-06: qty 85

## 2023-06-06 MED ORDER — VALPROIC ACID 250 MG/5ML PO SOLN
500.0000 mg | Freq: Three times a day (TID) | ORAL | Status: DC
  Administered 2023-06-07 – 2023-06-08 (×4): 500 mg
  Administered 2023-06-08: 250 mg
  Administered 2023-06-08 – 2023-06-09 (×4): 500 mg
  Filled 2023-06-06 (×10): qty 10

## 2023-06-06 MED ORDER — ENOXAPARIN SODIUM 40 MG/0.4ML IJ SOSY
40.0000 mg | PREFILLED_SYRINGE | INTRAMUSCULAR | Status: DC
  Administered 2023-06-06 – 2023-06-08 (×3): 40 mg via SUBCUTANEOUS
  Filled 2023-06-06 (×3): qty 0.4

## 2023-06-06 NOTE — ED Provider Notes (Signed)
Case discussed with General surgery.  Will see pt in consultation. CT scan shows diffuse dilatation of the colon with air-fluid levels.  No definitive transition point.  Differential includes ileus or colitis.  Will consult medical service for admission   Linwood Dibbles, MD 06/06/23 1640

## 2023-06-06 NOTE — H&P (Signed)
History and Physical    Patient: Kathleen Jensen ION:[id number] DOB: October 12, 1950 DOA: 06/06/2023 DOS: the patient was seen and examined on 06/06/2023 PCP: System, Provider Not In  Patient coming from: SN[name]  Chief Complaint:  Chief Complaint  Patient presents with   abdominal distention   HPI: Kathleen Jensen is a 72 y.o. female with medical history significant of right-sided hemiplegia and expressive aphasia 2/2 CVA in 2018, chronic A-fib not on anticoagulation, sick sinus syndrome s/p pacemaker, seizure, HTN, hypothyroidism, anemia, vitamin B deficiency and GERD who presented from her SNF today for evaluation of abdominal distention.  Per daughter, patient had a recent abdominal distention was diagnosed with ileus at the facility.  She was started on simethicone for treatment.  Abdominal distention progressed and repeat x-ray showed possible blockage so she was sent to the ED for further evaluation.  Patient has expressive aphasia and unable to answer questions but denies abdominal pain. Of note, patient with documented bowel movement on arrival to the ED.  ED course:  Hemodynamically stable, afebrile Labs showed WBC 6.9, Hgb 14.6, MCV one 1.6, platelet 131, sodium 137, K+ 3.6, CO2 35, glucose 67, creatinine 0.6, albumin 2.6, AST/ALT 70/32, alk phos 197, lipase 51.  CT A/P showed diffuse dilatation of the colon with air-fluid levels without transition point, stable percutaneous jejunostomy tube, low-density lesion in the left kidney and uterine fibroids.  General surgery was consulted for evaluation. Hospitalist was consulted for admission.   Review of Systems: unable to review all systems due to the inability of the patient to answer questions.  Due to expressive aphasia. 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 (HCC)    Hyperlipemia    Hypertension    Insomnia    Intracerebral hemorrhage, intraventricular (HCC)    Muscle weakness    Other abnormalities of gait and mobility    Seizures (HCC)    Stroke (HCC)    Unsteadiness on feet    Vitamin B deficiency    Past Surgical History:  Procedure Laterality Date   BACK SURGERY     Dec 18, 2016 Dr.Torrealba   BREAST BIOPSY Right    BREAST EXCISIONAL BIOPSY     CERVICAL SPINE SURGERY     C4   CESAREAN SECTION     GASTRIC BYPASS  03/2007   IR REPLC DUODEN/JEJUNO TUBE PERCUT W/FLUORO  01/27/2023   IR REPLC DUODEN/JEJUNO TUBE PERCUT W/FLUORO  03/07/2023   IR REPLC DUODEN/JEJUNO TUBE PERCUT W/FLUORO  04/30/2023   TONSILLECTOMY     Social History:  reports that she quit smoking about 43 years ago. She started smoking about 47 years ago. She has never used smokeless tobacco. She reports that she does not drink alcohol and does not use drugs.  Allergies  Allergen Reactions   Tape Rash   Demerol [Meperidine Hcl] Nausea And Vomiting   Sulfonamide Derivatives Itching   Bacitracin-Polymyxin B Dermatitis   Oxycontin [Oxycodone] Itching    Family History  Problem Relation Age of Onset   High blood pressure Mother    Diabetes Mother  Diabetes Father    Stroke Father    Cancer Sister        Colorectal cancer   Cancer Brother        colon    Prior to Admission medications   Medication Sig Start Date End Date Taking? Authorizing Provider  acetaminophen (TYLENOL) 160 MG/5ML solution Place 20 mLs (640 mg total) into feeding tube every 8 (eight) hours. Patient taking differently: Place 800 mg into feeding tube every 6 (six) hours. 12/03/22   Elgergawy, Leana Roe, MD  amiodarone (PACERONE) 200 MG tablet Place 1 tablet  (200 mg total) into feeding tube daily. 12/03/22   Elgergawy, Leana Roe, MD  amLODipine (NORVASC) 10 MG tablet Place 1 tablet (10 mg total) into feeding tube daily. 03/06/23   Marinda Elk, MD  ascorbic acid (VITAMIN C) 500 MG tablet Place 1 tablet (500 mg total) into feeding tube 2 (two) times daily. 02/22/22   Glade Lloyd, MD  aspirin EC 81 MG tablet 81 mg at bedtime. Via tube    [provider]  chlorhexidine (PERIDEX) 0.12 % solution Use as directed 15 mLs in the mouth or throat 2 (two) times daily.    [provider]  cyclobenzaprine (FLEXERIL) 5 MG tablet Place 2.5 mg into feeding tube in the morning and at bedtime. Hold If Sedated    [provider]  diazePAM (VALTOCO 10 MG DOSE) 10 MG/0.1ML LIQD Place 20 mg into the nose as needed for up to 3 days (intranasal Valtoco 20 mg (10 mg in each nostril) if she has any clinical seizure). 03/06/23 03/09/23  Marinda Elk, MD  famotidine (PEPCID) 40 MG/5ML suspension Place 40 mg into feeding tube daily. 03/25/22   [provider]  folic acid (FOLVITE) 800 MCG tablet Place 800 mcg into feeding tube daily.    [provider]  HYDROcodone-acetaminophen (NORCO/VICODIN) 5-325 MG tablet Place 1 tablet into feeding tube daily as needed for moderate pain. 03/06/23   Marinda Elk, MD  levETIRAcetam (KEPPRA) 100 MG/ML solution Place 1,000 mg into feeding tube 2 (two) times daily.    [provider]  losartan (COZAAR) 25 MG tablet Place 1 tablet (25 mg total) into feeding tube daily. 03/06/23   Marinda Elk, MD  Menthol, Topical Analgesic, (BIOFREEZE) 4 % GEL Apply 2 g topically 2 (two) times daily. Upper left arm    [provider]  metoprolol tartrate (LOPRESSOR) 25 MG tablet Place 1 tablet (25 mg total) into feeding tube 2 (two) times daily. Patient taking differently: Place 37.5 mg into feeding tube 2 (two) times daily. 12/03/22   Elgergawy, Leana Roe, MD  nutrition supplement,  JUVEN, (JUVEN) PACK Place 1 packet into feeding tube 2 (two) times daily between meals. Patient not taking: Reported on 03/01/2023 12/03/22   Elgergawy, Leana Roe, MD  Nutritional Supplements (FEEDING SUPPLEMENT, JEVITY 1.2 CAL,) LIQD Place 1,000 mLs into feeding tube continuous. At 18ml/hr Patient not taking: Reported on 03/01/2023 02/22/22   Glade Lloyd, MD  Nutritional Supplements (FEEDING SUPPLEMENT, PROSOURCE TF,) liquid Place 45 mLs into feeding tube 3 (three) times daily. Patient not taking: Reported on 03/01/2023 02/22/22   Glade Lloyd, MD  phenytoin (DILANTIN) 125 MG/5ML suspension Place 4 mLs (100 mg total) into feeding tube 2 (two) times daily. PLEASE RESUME IN 1 WEEK ONLY AFTER CHECKING DILANTIN LEVEL Patient taking differently: Place 75 mg into feeding tube 2 (two) times daily. 12/10/22   Elgergawy, Leana Roe, MD  polyethylene glycol (MIRALAX / Ethelene Hal)  17 g packet Place 34 g into feeding tube daily as needed for mild constipation. Patient not taking: Reported on 03/01/2023 02/22/22   Glade Lloyd, MD  senna-docusate (SENOKOT-S) 8.6-50 MG tablet Place 2 tablets into feeding tube 2 (two) times daily. 02/22/22   Glade Lloyd, MD  Simethicone 80 MG TABS 1 tablet (80 mg total) by PEG Tube route in the morning and at bedtime. Patient taking differently: Give 80 mg by tube in the morning and at bedtime. Simethicone 80mg  chewable tablet 02/22/22   Glade Lloyd, MD  simvastatin (ZOCOR) 10 MG tablet Place 1 tablet (10 mg total) into feeding tube at bedtime. 02/22/22   Glade Lloyd, MD  valproic acid (DEPAKENE) 250 MG/5ML solution Take 500-750 mg by mouth at bedtime. Give 500mg  three times daily and 750mg  at bedtime    [provider]  Zinc 50 MG TABS Give 50 mg by tube daily.    [provider]    Physical Exam: Vitals:   06/06/23 1300 06/06/23 1400 06/06/23 1500 06/06/23 1700  BP: (!) 130/105 (!) 130/93 131/83 (!) 121/103  Pulse: 64 60 60 60  Resp: (!) 24 (!) 22 20 (!) 23   Temp:    97.9 F (36.6 C)  TempSrc:      SpO2: 100% 96% 97% 97%   General: Pleasant, chronically ill-appearing woman laying in bed. No acute distress. HEENT: [place]/AT. Dry mucous membrane w/ dry lips. Missing teeth. CV: RRR. No murmurs, rubs, or gallops. No LE edema Pulmonary: Anterior lung sounds clear to auscultation. Decreased air movement throughout. Abdominal: Soft. Nontender.  Moderate abdominal distention.  Hypoactive bowel sounds. G-tube stable in place and clamped with no evidence of surrounding skin irritation. Extremities: Chronically contracted right upper extremity. Palpable DP and radial pulses. Skin: Warm and dry. No obvious rash or lesions. Neuro: Alert with eyes open. Chronic expressive aphasia and right hemiparesis.  Data Reviewed:  WBC 6.9, Hgb 14.6, MCV one 1.6, platelet 131, sodium 137, K+ 3.6, CO2 35, glucose 67, creatinine 0.6, albumin 2.6, AST/ALT 70/32, alk phos 197, lipase 51.  CT A/P showed diffuse dilatation of the colon with air-fluid levels without transition point, stable percutaneous jejunostomy tube, low-density lesion in the left kidney and uterine fibroids.  EKG showed atrially paced rhythm with a rate of 60  Assessment and Plan: Kathleen Jensen is a 72 y.o. female with medical history significant of right-sided hemiplegia and expressive aphasia 2/2 CVA in 2018, chronic A-fib not on anticoagulation, sick sinus syndrome s/p pacemaker, seizure, HTN, hypothyroidism, anemia, vitamin B deficiency and GERD who presented from her SNF today for evaluation of abdominal distention and admitted for ileus  # Ileus # Abdominal distention Bedbound patient with history of CVA complicated by right-sided hemiplegia and expressive aphasia now with jejunostomy tube placed in 2023 for feeding. She has had failure to thrive and recurrent PEG tube dislodgment, recently replaced 1 month ago. Patient recently treated for abdominal distention at her SNF, now presenting with  worsening distention. CT A/P shows diffuse dilatation of the colon with air-fluid levels but no transition point. Patient with 1 bowel movement in the ED. General surgery evaluated patient and recommend aggressive bowel regimen/simethicone. No surgical indications. Patient's advanced age, poor functional status, hypothyroidism and immobility places her at increased risk of Ogilvie syndrome. Will check electrolytes. -IV NS 100 cc/h x 10 hours -Simethicone 80 mg twice daily -Registered dietitian consult to assist with feeding -Check electrolytes and replete -As needed Zofran for nausea/vomiting -If no improvement in  distention tomorrow, will consider motility agent such as erythromycin  # Thrombocytopenia Platelet down to 131 from 149 2 months ago. No bruising on exam or any signs of active bleed. -Check smear to rule out platelet clumps -Trend CBC  # Kidney lesion CT A/P today showed stable indeterminate low-density lesion in the left kidney. Kidney function normal. -Check kidney ultrasound  # Hx CVA Patient with residual right-sided weakness and expressive aphasia.  She is PEG tube dependent for feeding. -Continue aspirin and statin  # HTN BP stable with SBP in the 120s to 130s. -Lopressor 25 mg twice daily  # SSS s/p pacemaker Chronic A-fib Currently not on any anticoagulation. EKG showed atrially paced rhythm with rate of 60. -Amiodarone 200 mg daily -Lopressor 25 mg twice daily -Check mag, Phos  # PEG tube feeding -Registered dietitian consult, appreciate recs -Follow-up mag, Phos, vitamin B-12, folate and vitamin D  # Hypothyroidism Synthroid discontinued in the past.  Last TSH 4.98 3 months ago. -Repeat TSH  # Hx of Seizure -Continue Dilantin, Depakote and Keppra   Advance Care Planning:   Code Status: Full Code   Consults: General Surgery  Family Communication: Discussed admission with daughter and legal guardian, Parke Poisson  Severity of Illness: The  appropriate patient status for this patient is OBSERVATION. Observation status is judged to be reasonable and necessary in order to provide the required intensity of service to ensure the patient's safety. The patient's presenting symptoms, physical exam findings, and initial radiographic and laboratory data in the context of their medical condition is felt to place them at decreased risk for further clinical deterioration. Furthermore, it is anticipated that the patient will be medically stable for discharge from the hospital within 2 midnights of admission.   Author: Steffanie Rainwater, MD 06/06/2023 6:05 PM  For on call review [email or web address].

## 2023-06-06 NOTE — Consult Note (Signed)
Consult Note  Kathleen Jensen 03/30/1951  [id number].    Requesting MD: Dr. Lynelle Doctor Chief Complaint/Reason for Consult: abdominal distension  HPI:  72 y.o. 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  History is obtained from EDP and chart review given patient has expressive aphasia.  Jejunostomy tube was initially placed in 2023 for failure to thrive and since then she has had it replaced by IR a few times for dislodgment.  Looks like it was last replaced 04/30/2023 by IR.   Past Surgeries: C-section, gastric bypass, jejunostomy placement   ROS: Review of Systems  Unable to perform ROS: Patient nonverbal    Family History  Problem Relation Age of Onset   High blood pressure Mother    Diabetes Mother    Diabetes Father    Stroke Father    Cancer Sister        Colorectal cancer   Cancer Brother        colon    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)    Hyperlipemia    Hypertension    Insomnia    Intracerebral hemorrhage, intraventricular (HCC)    Muscle weakness    Other abnormalities of gait and mobility    Seizures (HCC)    Stroke (HCC)    Unsteadiness on feet    Vitamin B deficiency     Past Surgical History:  Procedure Laterality Date   BACK SURGERY     Dec 18, 2016 Dr.Torrealba   BREAST BIOPSY  Right    BREAST EXCISIONAL BIOPSY     CERVICAL SPINE SURGERY     C4   CESAREAN SECTION     GASTRIC BYPASS  03/2007   IR REPLC DUODEN/JEJUNO TUBE PERCUT W/FLUORO  01/27/2023   IR REPLC DUODEN/JEJUNO TUBE PERCUT W/FLUORO  03/07/2023   IR REPLC DUODEN/JEJUNO TUBE PERCUT W/FLUORO  04/30/2023   TONSILLECTOMY      Social History:  reports that she quit smoking about 43 years ago. She started smoking about 47 years ago. She has never used smokeless tobacco. She reports that she does not drink alcohol and does not use drugs.  Allergies:  Allergies  Allergen Reactions   Tape Rash   Demerol [Meperidine Hcl] Nausea And Vomiting   Sulfonamide Derivatives Itching   Bacitracin-Polymyxin B Dermatitis   Oxycontin [Oxycodone] Itching    (Not in a hospital admission)   Blood pressure (!) 130/93, pulse 60, temperature 97.9 F (36.6 C), temperature source Oral, resp. rate (!) 22, SpO2 96%. Physical Exam: General: Chronically ill-appearing, female who is laying in bed in NAD HEENT: head is normocephalic, atraumatic.  Sclera are noninjected.  Pupils equal and round. EOMs intact.  Ears and nose without any masses or lesions.  Mouth is pink and moist Heart: regular, rate, and rhythm.   Lungs: Respiratory effort nonlabored Abd: soft, obese, moderate to severe  distention.  Grimace to palpation of mid and left abdomen.  J-tube in place clamped.  No surrounding skin irritation MSK: all 4 extremities are symmetrical with no cyanosis, clubbing, or edema. Skin: warm and dry   Results for orders placed or performed during the hospital encounter of 06/06/23 (from the past 48 hour(s))  CBC with Differential     Status: Abnormal   Collection Time: 06/06/23  1:20 PM  Result Value Ref Range   WBC 6.9 4.0 - 10.5 K/uL   RBC 4.41 3.87 - 5.11 MIL/uL   Hemoglobin 14.6 12.0 - 15.0 g/dL   HCT 13.2 44.0 - 10.2 %   MCV 101.6 (H) 80.0 - 100.0 fL   MCH 33.1 26.0 - 34.0 pg   MCHC 32.6 30.0 - 36.0 g/dL   RDW 72.5 36.6 -  44.0 %   Platelets 131 (L) 150 - 400 K/uL   nRBC 0.0 0.0 - 0.2 %   Neutrophils Relative % 36 %   Neutro Abs 2.5 1.7 - 7.7 K/uL   Lymphocytes Relative 52 %   Lymphs Abs 3.6 0.7 - 4.0 K/uL   Monocytes Relative 9 %   Monocytes Absolute 0.6 0.1 - 1.0 K/uL   Eosinophils Relative 3 %   Eosinophils Absolute 0.2 0.0 - 0.5 K/uL   Basophils Relative 0 %   Basophils Absolute 0.0 0.0 - 0.1 K/uL   Immature Granulocytes 0 %   Abs Immature Granulocytes 0.01 0.00 - 0.07 K/uL    Comment: Performed at [facility], [address], [place], [place] [address]  Comprehensive metabolic panel     Status: Abnormal   Collection Time: 06/06/23  1:20 PM  Result Value Ref Range   Sodium 137 135 - 145 mmol/L   Potassium 3.6 3.5 - 5.1 mmol/L   Chloride 96 (L) 98 - 111 mmol/L   CO2 35 (H) 22 - 32 mmol/L   Glucose, Bld 67 (L) 70 - 99 mg/dL    Comment: Glucose reference range applies only to samples taken after fasting for at least 8 hours.   BUN 35 (H) 8 - 23 mg/dL   Creatinine, Ser 5.95 0.44 - 1.00 mg/dL   Calcium 8.4 (L) 8.9 - 10.3 mg/dL   Total Protein 9.2 (H) 6.5 - 8.1 g/dL   Albumin 2.6 (L) 3.5 - 5.0 g/dL   AST 70 (H) 15 - 41 U/L   ALT 32 0 - 44 U/L   Alkaline Phosphatase 197 (H) 38 - 126 U/L   Total Bilirubin 0.5 <1.2 mg/dL   GFR, Estimated >63 >87 mL/min    Comment: (NOTE) Calculated using the CKD-EPI Creatinine Equation (2021)    Anion gap 6 5 - 15    Comment: Performed at [facility], [address] [place], [place] [address]  Lipase, blood     Status: None   Collection Time: 06/06/23  1:20 PM  Result Value Ref Range   Lipase 51 11 - 51 U/L    Comment: Performed at [facility], [address] [place], [place] [address]   No results found.    Assessment/Plan Abdominal distention Jejunostomy tube in place  Patient seen and examined and relevant labs and imaging personally reviewed.  Vital signs are overall stable with some  hypertension.  No tachycardia.  Afebrile.  She is not peritonitic on exam though significantly distended.  CT scan shows colonic distension without definitive transition point and stable J tube.  She has a history of  colonic distention on review of past CT scans with likely some element of chronic ileus. Labs and exam are not concerning for colitis. Recommend aggressive bowel regimen including scheduled simethicone  We will sign off but please do not hesitate to contact us with any questions or concerns or reconsult if needed.  I reviewed nursing notes, ED provider notes, last 24 h vitals and pain scores, last 48 h intake and output, last 24 h labs and trends, and last 24 h imaging results.   Eric Form, [facility] Surgery 06/06/2023, 4:04 PM Please see Amion for pager number during day hours 7:00am-4:30pm

## 2023-06-06 NOTE — ED Provider Notes (Signed)
[place] EMERGENCY DEPARTMENT AT [facility] Provider Note  CSN: [id number] Arrival date & time: 06/06/23 1207  Chief Complaint(s) abdominal distention  HPI Kathleen Jensen is a 72 y.o. female This is a 72 year old female with a history of CVA, expressive aphasia, J-tube, coming from her skilled nursing facility after they were concerned for obstruction.  They noticed distention of the patient's abdomen, and did an abdominal x-ray which showed signs of "obstruction."  Past surgical history significant for gastric bypass in 2008.   Past Medical History 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)    Hyperlipemia    Hypertension    Insomnia    Intracerebral hemorrhage, intraventricular (HCC)    Muscle weakness    Other abnormalities of gait and mobility    Seizures (HCC)    Stroke (HCC)    Unsteadiness on feet    Vitamin B deficiency    Patient Active Problem List   Diagnosis Date Noted   Acute cystitis 03/01/2023   Delirium secondary to UTI 03/01/2023   Atrial fibrillation, chronic (HCC) 03/01/2023   History of seizure 03/01/2023   Chronic hyponatremia 03/01/2023   Acute respiratory failure (HCC) 11/16/2022   Abdominal distention 11/16/2022   AMS (altered mental status) 05/17/2022   Altered mental status 05/15/2022   Sacral wound 05/15/2022   Intracranial hemorrhage (HCC)    Pressure injury of skin 02/18/2022   Chronic indwelling Foley catheter 02/18/2022   PAF (paroxysmal atrial fibrillation) (HCC) 02/18/2022   PEG (percutaneous endoscopic gastrostomy) status  (HCC) 02/18/2022   Septic shock (HCC) 02/15/2022   Constipation    Abdominal pain    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   History of right hemiplegia (HCC) 04/12/2020   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   Hyperlipidemia associated with type 2 diabetes mellitus (HCC) 07/30/2018   History of CVA (cerebrovascular accident) 07/30/2018   Vitamin B 12 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/30/2018   Asthma 03/13/2018   Seizures (HCC) 01/12/2018   CVA (cerebral vascular accident) (HCC) 12/08/2017   Abnormal thyroid blood test 12/08/2017   Pulmonary embolism and infarction (HCC) 12/08/2017   Chronic anemia 12/08/2017   History of expressive aphasia 10/15/2017   Seizure disorder (HCC) 10/15/2017   Dysphagia 10/15/2017   Hyperlipidemia 09/22/2017   Urinary frequency 09/22/2017   Airway hyperreactivity 01/26/2016   Disease of thyroid gland 01/26/2016   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   Other specified postprocedural states 01/26/2016   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   Chronic low back pain 04/10/2015   Degeneration of intervertebral disc of lumbar region 04/10/2015   Spondylolisthesis of lumbar region 04/10/2015   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   History of cardiac pacemaker in situ 01/09/2015   B12 DEFICIENCY 08/15/2008   Vitamin D deficiency 08/15/2008   Sick sinus syndrome (HCC) 08/15/2008   OBSTRUCTIVE SLEEP APNEA 08/11/2008   INSOMNIA 04/28/2008   Hyperlipidemia LDL goal <70 12/12/2006   RHINITIS, ALLERGIC NEC 12/12/2006   Hypothyroidism 12/11/2006   Essential hypertension 12/11/2006   GERD 12/11/2006   STRESS INCONTINENCE 12/11/2006   Home Medication(s) Prior to  Admission medications   Medication Sig Start Date End Date Taking? Authorizing Provider  acetaminophen (TYLENOL) 160 MG/5ML solution Place 20 mLs (640 mg total) into feeding tube every 8 (eight) hours. Patient taking differently: Place 800 mg into feeding tube every 6 (six) hours.  12/03/22   Elgergawy, Leana Roe, MD  amiodarone (PACERONE) 200 MG tablet Place 1 tablet (200 mg total) into feeding tube daily. 12/03/22   Elgergawy, Leana Roe, MD  amLODipine (NORVASC) 10 MG tablet Place 1 tablet (10 mg total) into feeding tube daily. 03/06/23   Marinda Elk, MD  ascorbic acid (VITAMIN C) 500 MG tablet Place 1 tablet (500 mg total) into feeding tube 2 (two) times daily. 02/22/22   Glade Lloyd, MD  aspirin EC 81 MG tablet 81 mg at bedtime. Via tube    [provider]  chlorhexidine (PERIDEX) 0.12 % solution Use as directed 15 mLs in the mouth or throat 2 (two) times daily.    [provider]  cyclobenzaprine (FLEXERIL) 5 MG tablet Place 2.5 mg into feeding tube in the morning and at bedtime. Hold If Sedated    [provider]  diazePAM (VALTOCO 10 MG DOSE) 10 MG/0.1ML LIQD Place 20 mg into the nose as needed for up to 3 days (intranasal Valtoco 20 mg (10 mg in each nostril) if she has any clinical seizure). 03/06/23 03/09/23  Marinda Elk, MD  famotidine (PEPCID) 40 MG/5ML suspension Place 40 mg into feeding tube daily. 03/25/22   [provider]  folic acid (FOLVITE) 800 MCG tablet Place 800 mcg into feeding tube daily.    [provider]  HYDROcodone-acetaminophen (NORCO/VICODIN) 5-325 MG tablet Place 1 tablet into feeding tube daily as needed for moderate pain. 03/06/23   Marinda Elk, MD  levETIRAcetam (KEPPRA) 100 MG/ML solution Place 1,000 mg into feeding tube 2 (two) times daily.    [provider]  losartan (COZAAR) 25 MG tablet Place 1 tablet (25 mg total) into feeding tube daily. 03/06/23   Marinda Elk, MD  Menthol, Topical Analgesic, (BIOFREEZE) 4 % GEL Apply 2 g topically 2 (two) times daily. Upper left arm    [provider]  metoprolol tartrate (LOPRESSOR) 25 MG tablet Place 1 tablet (25 mg total) into feeding tube 2 (two) times daily. Patient taking differently: Place 37.5 mg into feeding  tube 2 (two) times daily. 12/03/22   Elgergawy, Leana Roe, MD  nutrition supplement, JUVEN, (JUVEN) PACK Place 1 packet into feeding tube 2 (two) times daily between meals. Patient not taking: Reported on 03/01/2023 12/03/22   Elgergawy, Leana Roe, MD  Nutritional Supplements (FEEDING SUPPLEMENT, JEVITY 1.2 CAL,) LIQD Place 1,000 mLs into feeding tube continuous. At 61ml/hr Patient not taking: Reported on 03/01/2023 02/22/22   Glade Lloyd, MD  Nutritional Supplements (FEEDING SUPPLEMENT, PROSOURCE TF,) liquid Place 45 mLs into feeding tube 3 (three) times daily. Patient not taking: Reported on 03/01/2023 02/22/22   Glade Lloyd, MD  phenytoin (DILANTIN) 125 MG/5ML suspension Place 4 mLs (100 mg total) into feeding tube 2 (two) times daily. PLEASE RESUME IN 1 WEEK ONLY AFTER CHECKING DILANTIN LEVEL Patient taking differently: Place 75 mg into feeding tube 2 (two) times daily. 12/10/22   Elgergawy, Leana Roe, MD  polyethylene glycol (MIRALAX / GLYCOLAX) 17 g packet Place 34 g into feeding tube daily as needed for mild constipation. Patient not taking: Reported on 03/01/2023 02/22/22   Glade Lloyd, MD  senna-docusate (SENOKOT-S) 8.6-50 MG tablet Place 2 tablets into feeding  tube 2 (two) times daily. 02/22/22   Glade Lloyd, MD  Simethicone 80 MG TABS 1 tablet (80 mg total) by PEG Tube route in the morning and at bedtime. Patient taking differently: Give 80 mg by tube in the morning and at bedtime. Simethicone 80mg  chewable tablet 02/22/22   Glade Lloyd, MD  simvastatin (ZOCOR) 10 MG tablet Place 1 tablet (10 mg total) into feeding tube at bedtime. 02/22/22   Glade Lloyd, MD  valproic acid (DEPAKENE) 250 MG/5ML solution Take 500-750 mg by mouth at bedtime. Give 500mg  three times daily and 750mg  at bedtime    [provider]  Zinc 50 MG TABS Give 50 mg by tube daily.    [provider]                                                                                                                                     Past Surgical History Past Surgical History:  Procedure Laterality Date   BACK SURGERY     Dec 18, 2016 Dr.Torrealba   BREAST BIOPSY Right    BREAST EXCISIONAL BIOPSY     CERVICAL SPINE SURGERY     C4   CESAREAN SECTION     GASTRIC BYPASS  03/2007   IR REPLC DUODEN/JEJUNO TUBE PERCUT W/FLUORO  01/27/2023   IR REPLC DUODEN/JEJUNO TUBE PERCUT W/FLUORO  03/07/2023   IR REPLC DUODEN/JEJUNO TUBE PERCUT W/FLUORO  04/30/2023   TONSILLECTOMY     Family History Family History  Problem Relation Age of Onset   High blood pressure Mother    Diabetes Mother    Diabetes Father    Stroke Father    Cancer Sister        Colorectal cancer   Cancer Brother        colon    Social History Social History   Tobacco Use   Smoking status: Former    Current packs/day: 0.00    Types: Cigarettes    Start date: 07/30/1975    Quit date: 07/30/1979    Years since quitting: 43.8   Smokeless tobacco: Never  Vaping Use   Vaping status: Never Used  Substance Use Topics   Alcohol use: No    Alcohol/week: 0.0 standard drinks of alcohol   Drug use: No   Allergies Tape, Demerol [meperidine hcl], Sulfonamide derivatives, Bacitracin-polymyxin b, and Oxycontin [oxycodone]  Review of Systems Review of Systems  Physical Exam Vital Signs  I have reviewed the triage vital signs There were no vitals taken for this visit. *** Physical Exam Vitals reviewed.  HENT:     Head: Normocephalic.  Cardiovascular:     Rate and Rhythm: Normal rate.  Pulmonary:     Effort: Pulmonary effort is normal.  Abdominal:     General: There is distension.     Tenderness: There is abdominal tenderness. There is no guarding.  Neurological:     Mental Status: She is  alert. Mental status is at baseline.     ED Results and Treatments Labs (all labs ordered are listed, but only abnormal results are displayed) Labs Reviewed  CBC WITH DIFFERENTIAL/PLATELET  COMPREHENSIVE METABOLIC PANEL  LIPASE, BLOOD                                                                                                                           Radiology No results found.  Pertinent labs & imaging results that were available during my care of the patient were reviewed by me and considered in my medical decision making (see MDM for details).  Medications Ordered in ED Medications - No data to display                                                                                                                                   Procedures Procedures  (including critical care time)  Medical Decision Making / ED Course   This patient presents to the ED for concern of abdominal distention, this involves an extensive number of treatment options, and is a complaint that carries with it a high risk of complications and morbidity.  The differential diagnosis includes small bowel obstruction, ileus, perforated viscus, volvulus.  MDM: 72 year old female, multiple medical comorbidities, here today for abdominal distention.  Concern is for obstructive process, based on exam I think this is likely the case.  Will obtain CT imaging.  Basic labs ordered.  Anticipate surgical consult.  Patient signed out to Dr. Lynelle Doctor pending CT.  Additional history obtained: -Additional history obtained from EMS, nursing home paperwork. -External records from outside source obtained and reviewed including: Chart review including previous notes, labs, imaging, consultation notes   Lab Tests: -I ordered, reviewed, and interpreted labs.   The pertinent results include:   Labs Reviewed  CBC WITH DIFFERENTIAL/PLATELET  COMPREHENSIVE METABOLIC PANEL  LIPASE, BLOOD      EKG my independent review the patient's EKG shows no ST segment depressions or elevations, no T wave versions, no evidence of acute ischemia.  EKG Interpretation Date/Time:    Ventricular Rate:    PR Interval:    QRS Duration:    QT Interval:    QTC Calculation:    R Axis:      Text Interpretation:           Imaging Studies ordered: I ordered imaging studies including CT imaging of the  abdomen and pelvis I independently visualized and interpreted imaging. I agree with the radiologist interpretation   Medicines ordered and prescription drug management: No orders of the defined types were placed in this encounter.   -I have reviewed the patients home medicines and have made adjustments as needed  Critical interventions   Consultations Obtained: I requested consultation with the surgery team,  and discussed lab and imaging findings as well as pertinent plan - they recommend:    Cardiac Monitoring: The patient was maintained on a cardiac monitor.  I personally viewed and interpreted the cardiac monitored which showed an underlying rhythm of: Normal sinus rhythm.  Social Determinants of Health:  Factors impacting patients care include: This patient has multiple chronic medical comorbidities including CVA, j-tube dependency.   Reevaluation: After the interventions noted above, I reevaluated the patient and found that they have :improved  Co morbidities that complicate the patient evaluation  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)    Hyperlipemia    Hypertension    Insomnia    Intracerebral hemorrhage, intraventricular (HCC)    Muscle weakness    Other abnormalities of gait and mobility    Seizures (HCC)    Stroke (HCC)    Unsteadiness on feet    Vitamin B deficiency       Dispostion: Sign out to Dr. Lynelle Doctor pending general surgery  consultation.     Final Clinical Impression(s) / ED Diagnoses Final diagnoses:  None     @PCDICTATION @

## 2023-06-06 NOTE — ED Notes (Signed)
Pt brief changed and linens Pt had bowel movement. Nurse notified RN, Lamar Laundry. This tech and RN, Lamar Laundry changed pt, completed peri care and foley care.

## 2023-06-06 NOTE — ED Notes (Addendum)
Please call :  Franklin,[name] (Daughter)  With all updates

## 2023-06-06 NOTE — ED Triage Notes (Signed)
[facility] due to abdominal distention. They performed an xray and found a blockage. Other symptoms are unknown per EMS.     EMS vitals: 150/90 BP 84 HR 98% SPO2 on room air 185 CBG

## 2023-06-07 ENCOUNTER — Observation Stay (HOSPITAL_COMMUNITY): Payer: Medicare HMO

## 2023-06-07 DIAGNOSIS — S31000A Unspecified open wound of lower back and pelvis without penetration into retroperitoneum, initial encounter: Secondary | ICD-10-CM

## 2023-06-07 DIAGNOSIS — G8191 Hemiplegia, unspecified affecting right dominant side: Secondary | ICD-10-CM | POA: Diagnosis not present

## 2023-06-07 DIAGNOSIS — K567 Ileus, unspecified: Secondary | ICD-10-CM

## 2023-06-07 DIAGNOSIS — R140 Abdominal distension (gaseous): Secondary | ICD-10-CM | POA: Diagnosis not present

## 2023-06-07 DIAGNOSIS — Z87898 Personal history of other specified conditions: Secondary | ICD-10-CM

## 2023-06-07 LAB — BASIC METABOLIC PANEL
Anion gap: 6 (ref 5–15)
BUN: 27 mg/dL — ABNORMAL HIGH (ref 8–23)
CO2: 29 mmol/L (ref 22–32)
Calcium: 7.8 mg/dL — ABNORMAL LOW (ref 8.9–10.3)
Chloride: 105 mmol/L (ref 98–111)
Creatinine, Ser: 0.56 mg/dL (ref 0.44–1.00)
GFR, Estimated: >60 mL/min (ref >60)
Glucose, Bld: 81 mg/dL (ref 70–99)
Potassium: 3.4 mmol/L — ABNORMAL LOW (ref 3.5–5.1)
Sodium: 140 mmol/L (ref 135–145)

## 2023-06-07 LAB — GLUCOSE, CAPILLARY
Glucose-Capillary: 64 mg/dL — ABNORMAL LOW (ref 70–99)
Glucose-Capillary: 43 mg/dL — CL (ref 70–99)
Glucose-Capillary: 122 mg/dL — ABNORMAL HIGH (ref 70–99)
Glucose-Capillary: 69 mg/dL — ABNORMAL LOW (ref 70–99)
Glucose-Capillary: 113 mg/dL — ABNORMAL HIGH (ref 70–99)
Glucose-Capillary: 85 mg/dL (ref 70–99)

## 2023-06-07 LAB — MAGNESIUM: Magnesium: 1.9 mg/dL (ref 1.7–2.4)

## 2023-06-07 LAB — T4, FREE: Free T4: 0.92 ng/dL (ref 0.61–1.12)

## 2023-06-07 LAB — PHOSPHORUS: Phosphorus: 3.3 mg/dL (ref 2.5–4.6)

## 2023-06-07 LAB — VITAMIN D 25 HYDROXY (VIT D DEFICIENCY, FRACTURES): Vit D, 25-Hydroxy: 43.02 ng/mL (ref 30–100)

## 2023-06-07 MED ORDER — VITAMIN C 500 MG PO TABS
250.0000 mg | ORAL_TABLET | Freq: Two times a day (BID) | ORAL | Status: DC
  Administered 2023-06-07 – 2023-06-09 (×5): 250 mg
  Filled 2023-06-07 (×5): qty 1

## 2023-06-07 MED ORDER — CHLORHEXIDINE GLUCONATE CLOTH 2 % EX PADS
6.0000 | MEDICATED_PAD | Freq: Every day | CUTANEOUS | Status: DC
  Administered 2023-06-08 – 2023-06-09 (×3): 6 via TOPICAL

## 2023-06-07 MED ORDER — POLYETHYLENE GLYCOL 3350 17 G PO PACK
17.0000 g | PACK | Freq: Every day | ORAL | Status: DC
  Administered 2023-06-08 – 2023-06-09 (×2): 17 g
  Filled 2023-06-07 (×3): qty 1

## 2023-06-07 MED ORDER — DEXTROSE 50 % IV SOLN
12.5000 g | INTRAVENOUS | Status: AC
  Administered 2023-06-07: 12.5 g via INTRAVENOUS

## 2023-06-07 MED ORDER — DEXTROSE 50 % IV SOLN
INTRAVENOUS | Status: AC
  Filled 2023-06-07: qty 50

## 2023-06-07 MED ORDER — METOCLOPRAMIDE HCL 5 MG/ML IJ SOLN
5.0000 mg | Freq: Four times a day (QID) | INTRAMUSCULAR | Status: DC
  Administered 2023-06-07 – 2023-06-08 (×4): 5 mg via INTRAVENOUS
  Filled 2023-06-07 (×4): qty 2

## 2023-06-07 MED ORDER — DEXTROSE 50 % IV SOLN
25.0000 g | INTRAVENOUS | Status: AC
  Administered 2023-06-07: 25 g via INTRAVENOUS

## 2023-06-07 MED ORDER — OSMOLITE 1.5 CAL PO LIQD
1000.0000 mL | ORAL | Status: DC
  Administered 2023-06-07 – 2023-06-09 (×2): 1000 mL
  Filled 2023-06-07 (×3): qty 1000

## 2023-06-07 NOTE — Progress Notes (Signed)
[facility]                                                                              Freedom Salisbury, is a 72 y.o. 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  Per daughter, patient had recent abdominal distention and was diagnosed with ileus at the facility.  Patient was placed on simethicone for treatment.  Abdominal distention progressed and repeat x-ray showed a possible obstruction and she was sent to ED for evaluation.  In ED, she had documented bowel movement. CTAP showed diffuse dilation of the colon with air-fluid levels without transition point, stable percutaneous jejunostomy tube, low-density lesion in left kidney, uterine fibroids. General surgery was consulted.  Assessment & Plan    Principal Problem:   Ileus (HCC), abdominal distention with jejunostomy tube in place -CT scan showed colonic distention without definitive transition point -Seen by general surgery, per evaluation, likely has some element of chronic ileus and recommended aggressive bowel regimen including scheduled simethicone -Patient had 2 BMs since yesterday -Continue Senokot-S, MiraLAX, scheduled simethicone, IV Reglan  Active Problems: Moderate protein calorie malnutrition, dysphagia, PEG tube dependent -Ileus has improved, discussed with RD, will resume trickle tube feeds and advance to goal as tolerated  History of prior CVA with residual right hemiplegia, expressive aphasia, PEG tube dependent -Continue statin, aspirin    PAF  (paroxysmal atrial fibrillation) (HCC), SSS status post pacemaker -Continue amiodarone, Lopressor, not on anticoagulation    History of seizure -Continue Dilantin, Depakote and Keppra  History of hypothyroidism -Synthroid discontinued in the past -TSH 4.5, free T40.9  Thrombocytopenia Platelet down to 131 from 149 2 months ago.  -Follow platelet count    Kidney lesion CT A/P today showed stable indeterminate low-density lesion in the left kidney. Kidney function normal. -Renal ultrasound showed 4.1 cm cyst in the left kidney, no follow-up imaging recommended, no hydronephrosis  Hypertension Stable, continue Lopressor  Pressure injury -Coccyx medial stage IV, left thigh posterior stage II, POA -Wound care per nursing  Estimated body mass index is 30.02 kg/m as calculated from the following:   Height as of this encounter: 5\' 10"  (1.778 m).   Weight as of this encounter: 94.9 kg.  Code Status: Full code DVT Prophylaxis:  enoxaparin (LOVENOX) injection 40 mg Start: 06/06/23 2200   Level of Care: Level of care: Med-Surg Family Communication: Updated patient Disposition Plan:      Remains inpatient appropriate:      Procedures:    Consultants:   General surgery  Antimicrobials:   Anti-infectives (From admission, onward)    None          Medications  acetaminophen  800 mg Per Tube  Q6H   amiodarone  200 mg Per Tube Daily   amLODipine  10 mg Per Tube Daily   ascorbic acid  250 mg Per Tube BID   aspirin  81 mg Per Tube QHS   chlorhexidine  15 mL Mouth Rinse BID   cyclobenzaprine  2.5 mg Per J Tube TID   enoxaparin (LOVENOX) injection  40 mg Subcutaneous Q24H   famotidine  40 mg Per Tube Daily   folic acid  1 mg Per Tube Daily   levETIRAcetam  1,000 mg Per Tube BID   losartan  25 mg Per Tube Daily   metoCLOPramide (REGLAN) injection  5 mg Intravenous Q6H   metoprolol tartrate  37.5 mg Per Tube BID   Muscle Rub   Topical BID   phenytoin  25 mg Per Tube BID    polyethylene glycol  17 g Per Tube Daily   senna-docusate  2 tablet Per Tube BID   simethicone  80 mg Per Tube BID   simvastatin  10 mg Per Tube QHS   torsemide  10 mg Per Tube Daily   valproic acid  500 mg Per Tube TID with meals   And   valproic acid  750 mg Per Tube QHS      Subjective:   Kathleen Jensen was seen and examined today.  Per patient, had 2 BMs, feeling somewhat better today.  No acute abdominal pain, nausea or vomiting. No acute events overnight.    Objective:   Vitals:   06/06/23 2048 06/07/23 0222 06/07/23 0538 06/07/23 0933  BP: 139/79 (!) 152/80 137/79 139/73  Pulse: 60 (!) 59 (!) 59 (!) 59  Resp: 18 16 17 15   Temp: 97.9 F (36.6 C) (!) 97.5 F (36.4 C) 98 F (36.7 C) 97.6 F (36.4 C)  TempSrc: Oral Oral  Oral  SpO2:  97% 100% 100%  Weight: 94.9 kg     Height: 5\' 10"  (1.778 m)       Intake/Output Summary (Last 24 hours) at 06/07/2023 1125 Last data filed at 06/07/2023 0229 Gross per 24 hour  Intake 0 ml  Output 1000 ml  Net -1000 ml     Wt Readings from Last 3 Encounters:  06/06/23 94.9 kg  04/30/23 98 kg  03/06/23 97.6 kg     Exam General: Alert and oriented x 3, NAD, ill-appearing Cardiovascular: S1 S2 auscultated,  RRR Respiratory: Clear to auscultation bilaterally, no wheezing Gastrointestinal: Soft, nontender, +distended, + hypoactive BS Ext: no pedal edema bilaterally Neuro: chronically contracted RUE, right-sided hemiplegia Psych: expressive aphasia, chronic    Data Reviewed:  I have personally reviewed following labs    CBC Lab Results  Component Value Date   WBC 6.9 06/06/2023   RBC 4.41 06/06/2023   HGB 14.6 06/06/2023   HCT 44.8 06/06/2023   MCV 101.6 (H) 06/06/2023   MCH 33.1 06/06/2023   PLT 131 (L) 06/06/2023   MCHC 32.6 06/06/2023   RDW 15.1 06/06/2023   LYMPHSABS 3.6 06/06/2023   MONOABS 0.6 06/06/2023   EOSABS 0.2 06/06/2023   BASOSABS 0.0 06/06/2023     Last metabolic panel Lab Results   Component Value Date   NA 140 06/07/2023   K 3.4 (L) 06/07/2023   CL 105 06/07/2023   CO2 29 06/07/2023   BUN 27 (H) 06/07/2023   CREATININE 0.56 06/07/2023   GLUCOSE 81 06/07/2023   GFRNONAA >60 06/07/2023   GFRAA >60 04/14/2020   CALCIUM 7.8 (L) 06/07/2023   PHOS 3.9 06/06/2023  PROT 9.2 (H) 06/06/2023   ALBUMIN 2.6 (L) 06/06/2023   BILITOT 0.5 06/06/2023   ALKPHOS 197 (H) 06/06/2023   AST 70 (H) 06/06/2023   ALT 32 06/06/2023   ANIONGAP 6 06/07/2023    CBG (last 3)  Recent Labs    06/07/23 0725  GLUCAP 64*      Coagulation Profile: No results for input(s): "INR", "PROTIME" in the last 168 hours.   Radiology Studies: I have personally reviewed the imaging studies  DG Abd Portable 1V  Result Date: 06/07/2023 CLINICAL DATA:  Ileus. EXAM: PORTABLE ABDOMEN - 1 VIEW COMPARISON:  March 01, 2023.  June 06, 2023. FINDINGS: Jejunostomy tube is again noted in left upper quadrant. Metallic linear and curvilinear densities are seen in the right-sided the abdomen which were present on prior radiographs as well as CT scans. These appear to represent ingested foreign bodies sitting within the right colon. Stable mild gaseous distention of the colon is noted most consistent with ileus. IMPRESSION: Stable mild gaseous distention of the colon is noted most consistent with ileus. Metallic linear and curvilinear densities are seen in right side of abdomen most consistent with ingested foreign bodies within the right colon based on prior CT scans. Electronically Signed   By: Lupita Raider M.D.   On: 06/07/2023 08:46   US RENAL  Result Date: 06/06/2023 CLINICAL DATA:  Left kidney lesion. EXAM: RENAL / URINARY TRACT ULTRASOUND COMPLETE COMPARISON:  CT abdomen and pelvis 06/06/2023. FINDINGS: Right Kidney: Renal measurements: 10.1 x 4.7 x 4.2 cm = volume: 105 mL. Echogenicity within normal limits. No mass or hydronephrosis visualized. Left Kidney: Renal measurements: 9.6 x 5.0 x 3.6 cm =  volume: 90.8 mL. Echogenicity within normal limits. There is no hydronephrosis. There is a 4.1 x 2.5 x 3.1 cm cyst in the left kidney corresponding to CT finding. Bladder: Appears normal for degree of bladder distention. Other: None. IMPRESSION: 1. 4.1 cm cyst in the left kidney corresponding to CT finding. No follow-up imaging recommended. 2. No hydronephrosis. Electronically Signed   By: Darliss Cheney M.D.   On: 06/06/2023 23:34   CT ABDOMEN PELVIS WO CONTRAST  Result Date: 06/06/2023 CLINICAL DATA:  Bowel obstruction EXAM: CT ABDOMEN AND PELVIS WITHOUT CONTRAST TECHNIQUE: Multidetector CT imaging of the abdomen and pelvis was performed following the standard protocol without IV contrast. RADIATION DOSE REDUCTION: This exam was performed according to the departmental dose-optimization program which includes automated exposure control, adjustment of the mA and/or kV according to patient size and/or use of iterative reconstruction technique. COMPARISON:  CT abdomen and pelvis 11/19/2022 FINDINGS: Lower chest: No acute abnormality. Hepatobiliary: No focal liver abnormality is seen. Status post cholecystectomy. No biliary dilatation. Pancreas: Unremarkable. No pancreatic ductal dilatation or surrounding inflammatory changes. Spleen: Normal in size without focal abnormality. Adrenals/Urinary Tract: The bladder is decompressed by Foley catheter. Rounded low-density lesion in the left kidney measures 3.5 cm, unchanged. This measures 30 Hounsfield units. There is no hydronephrosis or perinephric stranding. The adrenal glands are within normal limits. Stomach/Bowel: The colon is diffusely dilated with air-fluid levels. No definitive transition point seen. No focal inflammation or wall thickening seen. The appendix and small bowel are within normal limits. There surgical changes in the stomach. Percutaneous jejunostomy tube in place similar to prior. Vascular/Lymphatic: Aortic atherosclerosis. No enlarged abdominal or  pelvic lymph nodes. Reproductive: There are calcified uterine fibroids measuring up to 3 cm. Adnexa are unremarkable. Other: No abdominal wall hernia or abnormality. No abdominopelvic ascites. Musculoskeletal: L4-5  posterior fusion hardware present. No acute fractures are seen. IMPRESSION: 1. Diffuse dilatation of the colon with air-fluid levels. No definitive transition point seen. Findings may represent ileus or colitis. 2. Stable percutaneous jejunostomy tube. 3. Stable indeterminate low-density lesion in the left kidney. Recommend further evaluation with renal ultrasound. 4. Uterine fibroids. 5. Aortic atherosclerosis. Aortic Atherosclerosis (ICD10-I70.0). Electronically Signed   By: Darliss Cheney M.D.   On: 06/06/2023 16:16       Kathleen Jensen M.D. [facility] 06/07/2023, 11:25 AM  Available via Epic secure chat 7am-7pm After 7 pm, please refer to night coverage provider listed on amion.

## 2023-06-07 NOTE — Plan of Care (Signed)

## 2023-06-07 NOTE — Progress Notes (Signed)
Initial Nutrition Assessment  DOCUMENTATION CODES:   Not applicable  INTERVENTION:   Initiate trickle tube feeds via J-tube: - Osmolite 1.5 @ 20 ml/hr (480 ml/day)   RD will monitor for ability to advance tube feeding regimen to goal: - Osmolite 1.5 @ 65 ml/hr x 18 hours, holding 1 hour before and 2 hours after each dilantin administration - PROSource TF20 60 ml BID - Free water flushes of 150 ml every 4 hours  Recommended tube feeding regimen at goal rate with recommended free water flushes would provide 1915 kcal, 113 grams of protein, and 1791 ml of H2O.   - Once tube feeds are being advanced to goal regimen, recommend ordering 1 packet Juven BID per tube to support wound healing, each packet provides 95 calories, 2.5 grams of protein, and 9.8 grams of carbohydrate  - Checking both zinc and copper labs as it appears pt has been on zinc supplementation since last admission in August 2024  - d/c zinc sulfate 220 mg daily  - Checking vitamin C lab as it appears pt has been on vitamin C supplementation since last admission in August 2024  - Decrease vitamin C to 250 mg BID per tube, recommend d/c vitamin C if lab comes back high  - MD has checked vitamin D lab, awaiting results  NUTRITION DIAGNOSIS:   Inadequate oral intake related to inability to eat as evidenced by NPO status.  GOAL:   Patient will meet greater than or equal to 90% of their needs  MONITOR:   Labs, Weight trends, TF tolerance, Skin, I & O's  REASON FOR ASSESSMENT:   Consult Assessment of nutrition requirement/status, Enteral/tube feeding initiation and management (trickle tube feeds via J-tube)  ASSESSMENT:   72 year old female who presented to the ED from SNF on 11/08 with abdominal distention. PMH of stroke with expressive aphasia and right-sided hemiplegia, HTN, GERD, seizures, CKD, chronic atrial fibrillation, sick sinus syndrome s/p pacemaker, s/p J-tube placement in 2023 for failure to thrive  requiring multiple replacements by IR, gastric bypass in 2008.  RD working remotely.  Noted CT scan shows colonic distension without definitive transition point and stable J-tube. Per notes, pt has a history of colonic distention with likely some element of chronic ileus. Consult received for initiation of trickle tube feeds without advancement. Will leave recommendations for goal rate.  Unable to obtain diet and weight history at this time. Reviewed Home Meds list. Pt appears to have been taking vitamin C 500 mg BID and zinc 50 mg daily at SNF. Unsure how long pt has been taking these medications. Since excess zinc can contributed to copper deficiency, will d/c zinc sulfate and obtain zinc and copper labs. Will also obtain vitamin C lab given pt has been on such a high dose for an unknown period of time.  It is unclear what tube feeding formula pt was on PTA. Jevity 1.2 listed in Home Meds list but reported as "patient not taking." Will order Osmolite 1.5 tube feeding formula which pt was on and tolerating well during a previous admission.  Reviewed weight history in chart. Pt with weight gain from October 2023 through October 2024. Pt with a 3.1 kg weight loss since 04/30/23. This is a 3.2% weight loss in 1 month which is not clinically significant for timeframe.  Discussed plan to start trickle feeds with RN.  Medications reviewed and include: vitamin C 500 mg BID, pepcid, folic acid, dilantin BID, senna, simethicone 80 mg BID, torsemide, zinc sulfate 220 mg  daily  Micronutrient Profile: Vitamin B12: 1092 (high) Folate B9: 27.6 (WNL) Vitamin D: pending Vitamin C: pending Copper: pending Zinc: pending CRP: pending  Labs reviewed: chloride 96, BUN 35, platelets 131 CBG's: 64  UOP: 1000 ml x 24 hours  NUTRITION - FOCUSED PHYSICAL EXAM:  Unable to complete at this time. RD working remotely.  Diet Order:   Diet Order     None       EDUCATION NEEDS:   No education needs have  been identified at this time  Skin:  Skin Assessment: Skin Integrity Issues: Stage III: sacrum  Last BM:  06/07/23 smear this morning, 2 large type 7 BMs yesterday  Height:   Ht Readings from Last 1 Encounters:  06/06/23 5\' 10"  (1.778 m)    Weight:   Wt Readings from Last 1 Encounters:  06/06/23 94.9 kg    Ideal Body Weight:  68.2 kg  BMI:  Body mass index is 30.02 kg/m.  Estimated Nutritional Needs:   Kcal:  1700-1900  Protein:  90-110 grams  Fluid:  1.7-1.9 L    Mertie Clause, MS, RD, LDN Registered Dietitian II Please see AMiON for contact information.

## 2023-06-07 NOTE — Progress Notes (Signed)
Hypoglycemic Event  CBG: 43  Treatment: D50 50 mL (25 gm)  Symptoms: None  Follow-up CBG: Time:11:50  CBG Result:122  Possible Reasons for Event: Inadequate meal intake  Comments/MD notified: Dr Isidoro Donning was notified; pt alert; no acute signs and symptoms of distress     [name]

## 2023-06-07 NOTE — TOC Progression Note (Signed)
Transition of Care [facility] - Progression Note    Patient Details  Name: Kathleen Jensen MRN: [id number] Date of Birth: 12/07/1950  [facility] CM/SW Contact  Darleene Cleaver, [place] Phone Number: 06/07/2023, 4:59 PM  Clinical Narrative:     CSW acknowledged consult for SNF placement.  TOC awaiting for PT evaluation.  TOC to continue to follow patient's progress throughout discharge planning.       Expected Discharge Plan and Services  SNF verse [facility].                                             Social Determinants of Health (SDOH) Interventions SDOH Screenings   Food Insecurity: No Food Insecurity (06/06/2023)  Housing: Low Risk  (06/06/2023)  Transportation Needs: No Transportation Needs (06/06/2023)  Utilities: Not At Risk (06/06/2023)  Depression (PHQ2-9): Low Risk  (05/22/2019)  Tobacco Use: Medium Risk (06/06/2023)    Readmission Risk Interventions    02/18/2022    2:23 PM  Readmission Risk Prevention Plan  Transportation Screening Complete  PCP or Specialist Appt within 5-7 Days Complete  Home Care Screening Complete  Medication Review (RN CM) Complete

## 2023-06-08 DIAGNOSIS — R140 Abdominal distension (gaseous): Secondary | ICD-10-CM | POA: Diagnosis not present

## 2023-06-08 DIAGNOSIS — G8191 Hemiplegia, unspecified affecting right dominant side: Secondary | ICD-10-CM | POA: Diagnosis not present

## 2023-06-08 DIAGNOSIS — K567 Ileus, unspecified: Secondary | ICD-10-CM | POA: Diagnosis not present

## 2023-06-08 DIAGNOSIS — I480 Paroxysmal atrial fibrillation: Secondary | ICD-10-CM | POA: Diagnosis not present

## 2023-06-08 DIAGNOSIS — Z931 Gastrostomy status: Secondary | ICD-10-CM

## 2023-06-08 LAB — BASIC METABOLIC PANEL
Anion gap: 8 (ref 5–15)
BUN: 23 mg/dL (ref 8–23)
CO2: 27 mmol/L (ref 22–32)
Calcium: 7.8 mg/dL — ABNORMAL LOW (ref 8.9–10.3)
Chloride: 102 mmol/L (ref 98–111)
Creatinine, Ser: 0.64 mg/dL (ref 0.44–1.00)
GFR, Estimated: >60 mL/min (ref >60)
Glucose, Bld: 89 mg/dL (ref 70–99)
Potassium: 2.6 mmol/L — CL (ref 3.5–5.1)
Sodium: 137 mmol/L (ref 135–145)

## 2023-06-08 LAB — GLUCOSE, CAPILLARY
Glucose-Capillary: 205 mg/dL — ABNORMAL HIGH (ref 70–99)
Glucose-Capillary: 52 mg/dL — ABNORMAL LOW (ref 70–99)
Glucose-Capillary: 81 mg/dL (ref 70–99)
Glucose-Capillary: 81 mg/dL (ref 70–99)
Glucose-Capillary: 90 mg/dL (ref 70–99)
Glucose-Capillary: 73 mg/dL (ref 70–99)
Glucose-Capillary: 111 mg/dL — ABNORMAL HIGH (ref 70–99)

## 2023-06-08 LAB — PHOSPHORUS
Phosphorus: 3.5 mg/dL (ref 2.5–4.6)
Phosphorus: 2.8 mg/dL (ref 2.5–4.6)

## 2023-06-08 LAB — MAGNESIUM: Magnesium: 1.9 mg/dL (ref 1.7–2.4)

## 2023-06-08 LAB — C-REACTIVE PROTEIN: CRP: 1.3 mg/dL — ABNORMAL HIGH (ref <1.0)

## 2023-06-08 MED ORDER — DEXTROSE 50 % IV SOLN
25.0000 g | INTRAVENOUS | Status: AC
  Administered 2023-06-08: 25 g via INTRAVENOUS
  Filled 2023-06-08: qty 50

## 2023-06-08 MED ORDER — POTASSIUM CHLORIDE 10 MEQ/100ML IV SOLN
10.0000 meq | INTRAVENOUS | Status: AC
  Administered 2023-06-08 (×3): 10 meq via INTRAVENOUS
  Filled 2023-06-08 (×3): qty 100

## 2023-06-08 MED ORDER — DEXTROSE-SODIUM CHLORIDE 5-0.9 % IV SOLN: INTRAVENOUS | Status: AC

## 2023-06-08 MED ORDER — POTASSIUM CHLORIDE 20 MEQ PO PACK
40.0000 meq | PACK | ORAL | Status: AC
  Administered 2023-06-08 (×2): 40 meq
  Filled 2023-06-08 (×2): qty 2

## 2023-06-08 NOTE — Care Management Important Message (Signed)
Important Message  Patient Details  Name: Kathleen Jensen MRN: [id number] Date of Birth: 06-06-1951   Important Message Given:  Yes - Medicare IM     Georgie Chard, LCSW 06/08/2023, 9:23 AM

## 2023-06-08 NOTE — Care Management Obs Status (Signed)
MEDICARE OBSERVATION STATUS NOTIFICATION   Patient Details  Name: Kathleen Jensen MRN: [id number] Date of Birth: March 05, 1951   Medicare Observation Status Notification Given:  Yes    Georgie Chard, LCSW 06/08/2023, 9:23 AM

## 2023-06-08 NOTE — Progress Notes (Signed)
[facility]                                                                              Kathleen Jensen, is a 72 y.o. 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  Per daughter, patient had recent abdominal distention and was diagnosed with ileus at the facility.  Patient was placed on simethicone for treatment.  Abdominal distention progressed and repeat x-ray showed a possible obstruction and she was sent to ED for evaluation.  In ED, she had documented bowel movement. CTAP showed diffuse dilation of the colon with air-fluid levels without transition point, stable percutaneous jejunostomy tube, low-density lesion in left kidney, uterine fibroids. General surgery was consulted.  Assessment & Plan    Principal Problem:   Ileus (HCC), abdominal distention with jejunostomy tube in place -CT scan showed colonic distention without definitive transition point -Seen by general surgery, per evaluation, likely has some element of chronic ileus and recommended aggressive bowel regimen including scheduled simethicone -Improving, continue bowel regimen,  -patient was started on trickle feeds on 11/9, will advance slowly to the goal, dietitian following  Active Problems: Moderate protein calorie malnutrition, dysphagia, PEG tube dependent -Ileus has improved, tube feeding resumed, will increase slowly towards goal  Hypokalemia -Potassium 2.6, magnesium 1.9 -Replaced  via tube and IV aggressively  History of prior CVA with residual  right hemiplegia, expressive aphasia, PEG tube dependent -Continue statin, aspirin    PAF (paroxysmal atrial fibrillation) (HCC), SSS status post pacemaker -Continue amiodarone, Lopressor - not on anticoagulation    History of seizure -Continue Dilantin, Depakote and Keppra  History of hypothyroidism -Synthroid discontinued in the past -TSH 4.5, free T4 0.9  Thrombocytopenia Platelet down to 131 from 149 2 months ago.  -Follow platelet count    Kidney lesion CT A/P today showed stable indeterminate low-density lesion in the left kidney. Kidney function normal. -Renal ultrasound showed 4.1 cm cyst in the left kidney, no follow-up imaging recommended, no hydronephrosis  Hypertension Stable, continue Lopressor  Pressure injury -Coccyx medial stage IV, left thigh posterior stage II, POA -Wound care per nursing  Estimated body mass index is 30.21 kg/m as calculated from the following:   Height as of this encounter: 5\' 10"  (1.778 m).   Weight as of this encounter: 95.5 kg.  Code Status: Full code DVT Prophylaxis:  enoxaparin (LOVENOX) injection 40 mg Start: 06/06/23 2200   Level of Care: Level of care: Med-Surg Family Communication: Updated patient Disposition Plan:      Remains inpatient appropriate:   If patient is on tube feeds at goal and tolerating without issues, plan for possible DC tomorrow   Procedures:    Consultants:  General surgery  Antimicrobials:   Anti-infectives (From admission, onward)    None          Medications  acetaminophen  800 mg Per Tube Q6H   amiodarone  200 mg Per Tube Daily   amLODipine  10 mg Per Tube Daily   ascorbic acid  250 mg Per Tube BID   aspirin  81 mg Per Tube QHS   chlorhexidine  15 mL Mouth Rinse BID   Chlorhexidine Gluconate Cloth  6 each Topical Daily   cyclobenzaprine  2.5 mg Per J Tube TID   enoxaparin (LOVENOX) injection  40 mg Subcutaneous Q24H   famotidine  40 mg Per Tube Daily   folic acid  1 mg Per  Tube Daily   levETIRAcetam  1,000 mg Per Tube BID   losartan  25 mg Per Tube Daily   metoCLOPramide (REGLAN) injection  5 mg Intravenous Q6H   metoprolol tartrate  37.5 mg Per Tube BID   Muscle Rub   Topical BID   phenytoin  25 mg Per Tube BID   polyethylene glycol  17 g Per Tube Daily   potassium chloride  40 mEq Per Tube Q4H   senna-docusate  2 tablet Per Tube BID   simethicone  80 mg Per Tube BID   simvastatin  10 mg Per Tube QHS   torsemide  10 mg Per Tube Daily   valproic acid  500 mg Per Tube TID with meals   And   valproic acid  750 mg Per Tube QHS      Subjective:   Kathleen Jensen was seen and examined today.  No acute complaints, no nausea vomiting.  Tube feeds at 20 cc an hour.  No fevers or chills.  Objective:   Vitals:   06/08/23 0432 06/08/23 0622 06/08/23 0857 06/08/23 1056  BP:  127/75 127/75 (!) 129/95  Pulse:  (!) 59 (!) 59 (!) 59  Resp:  18  18  Temp:  (!) 97.5 F (36.4 C)  98.1 F (36.7 C)  TempSrc:  Oral  Oral  SpO2:  99%  98%  Weight: 95.5 kg     Height:        Intake/Output Summary (Last 24 hours) at 06/08/2023 1329 Last data filed at 06/08/2023 1100 Gross per 24 hour  Intake 399.75 ml  Output 2100 ml  Net -1700.25 ml     Wt Readings from Last 3 Encounters:  06/08/23 95.5 kg  04/30/23 98 kg  03/06/23 97.6 kg   Physical Exam General: Alert and oriented, dysarthria, NAD Cardiovascular: S1 S2 clear, RRR.  Respiratory: CTAB, no wheezing, rales or rhonchi Gastrointestinal: Soft, nontender, + G-tube Ext: no pedal edema bilaterally Neuro: right-sided hemiplegia Psych: chronic expressive aphasia   Data Reviewed:  I have personally reviewed following labs    CBC Lab Results  Component Value Date   WBC 6.9 06/06/2023   RBC 4.41 06/06/2023   HGB 14.6 06/06/2023   HCT 44.8 06/06/2023   MCV 101.6 (H) 06/06/2023   MCH 33.1 06/06/2023   PLT 131 (L) 06/06/2023   MCHC 32.6 06/06/2023   RDW 15.1 06/06/2023   LYMPHSABS 3.6 06/06/2023    MONOABS 0.6 06/06/2023   EOSABS 0.2 06/06/2023   BASOSABS 0.0 06/06/2023     Last metabolic panel Lab Results  Component Value Date   NA 137 06/08/2023   K 2.6 (LL) 06/08/2023   CL 102 06/08/2023   CO2 27 06/08/2023   BUN 23 06/08/2023   CREATININE  0.64 06/08/2023   GLUCOSE 89 06/08/2023   GFRNONAA >60 06/08/2023   GFRAA >60 04/14/2020   CALCIUM 7.8 (L) 06/08/2023   PHOS 3.5 06/08/2023   PROT 9.2 (H) 06/06/2023   ALBUMIN 2.6 (L) 06/06/2023   BILITOT 0.5 06/06/2023   ALKPHOS 197 (H) 06/06/2023   AST 70 (H) 06/06/2023   ALT 32 06/06/2023   ANIONGAP 8 06/08/2023    CBG (last 3)  Recent Labs    06/08/23 0412 06/08/23 0730 06/08/23 1105  GLUCAP 81 81 90      Coagulation Profile: No results for input(s): "INR", "PROTIME" in the last 168 hours.   Radiology Studies: I have personally reviewed the imaging studies  DG Abd Portable 1V  Result Date: 06/07/2023 CLINICAL DATA:  Ileus. EXAM: PORTABLE ABDOMEN - 1 VIEW COMPARISON:  March 01, 2023.  June 06, 2023. FINDINGS: Jejunostomy tube is again noted in left upper quadrant. Metallic linear and curvilinear densities are seen in the right-sided the abdomen which were present on prior radiographs as well as CT scans. These appear to represent ingested foreign bodies sitting within the right colon. Stable mild gaseous distention of the colon is noted most consistent with ileus. IMPRESSION: Stable mild gaseous distention of the colon is noted most consistent with ileus. Metallic linear and curvilinear densities are seen in right side of abdomen most consistent with ingested foreign bodies within the right colon based on prior CT scans. Electronically Signed   By: Lupita Raider M.D.   On: 06/07/2023 08:46   US RENAL  Result Date: 06/06/2023 CLINICAL DATA:  Left kidney lesion. EXAM: RENAL / URINARY TRACT ULTRASOUND COMPLETE COMPARISON:  CT abdomen and pelvis 06/06/2023. FINDINGS: Right Kidney: Renal measurements: 10.1 x 4.7 x  4.2 cm = volume: 105 mL. Echogenicity within normal limits. No mass or hydronephrosis visualized. Left Kidney: Renal measurements: 9.6 x 5.0 x 3.6 cm = volume: 90.8 mL. Echogenicity within normal limits. There is no hydronephrosis. There is a 4.1 x 2.5 x 3.1 cm cyst in the left kidney corresponding to CT finding. Bladder: Appears normal for degree of bladder distention. Other: None. IMPRESSION: 1. 4.1 cm cyst in the left kidney corresponding to CT finding. No follow-up imaging recommended. 2. No hydronephrosis. Electronically Signed   By: Darliss Cheney M.D.   On: 06/06/2023 23:34   CT ABDOMEN PELVIS WO CONTRAST  Result Date: 06/06/2023 CLINICAL DATA:  Bowel obstruction EXAM: CT ABDOMEN AND PELVIS WITHOUT CONTRAST TECHNIQUE: Multidetector CT imaging of the abdomen and pelvis was performed following the standard protocol without IV contrast. RADIATION DOSE REDUCTION: This exam was performed according to the departmental dose-optimization program which includes automated exposure control, adjustment of the mA and/or kV according to patient size and/or use of iterative reconstruction technique. COMPARISON:  CT abdomen and pelvis 11/19/2022 FINDINGS: Lower chest: No acute abnormality. Hepatobiliary: No focal liver abnormality is seen. Status post cholecystectomy. No biliary dilatation. Pancreas: Unremarkable. No pancreatic ductal dilatation or surrounding inflammatory changes. Spleen: Normal in size without focal abnormality. Adrenals/Urinary Tract: The bladder is decompressed by Foley catheter. Rounded low-density lesion in the left kidney measures 3.5 cm, unchanged. This measures 30 Hounsfield units. There is no hydronephrosis or perinephric stranding. The adrenal glands are within normal limits. Stomach/Bowel: The colon is diffusely dilated with air-fluid levels. No definitive transition point seen. No focal inflammation or wall thickening seen. The appendix and small bowel are within normal limits. There surgical  changes in the stomach. Percutaneous jejunostomy tube in place similar to prior.  Vascular/Lymphatic: Aortic atherosclerosis. No enlarged abdominal or pelvic lymph nodes. Reproductive: There are calcified uterine fibroids measuring up to 3 cm. Adnexa are unremarkable. Other: No abdominal wall hernia or abnormality. No abdominopelvic ascites. Musculoskeletal: L4-5 posterior fusion hardware present. No acute fractures are seen. IMPRESSION: 1. Diffuse dilatation of the colon with air-fluid levels. No definitive transition point seen. Findings may represent ileus or colitis. 2. Stable percutaneous jejunostomy tube. 3. Stable indeterminate low-density lesion in the left kidney. Recommend further evaluation with renal ultrasound. 4. Uterine fibroids. 5. Aortic atherosclerosis. Aortic Atherosclerosis (ICD10-I70.0). Electronically Signed   By: Darliss Cheney M.D.   On: 06/06/2023 16:16       Nusrat Encarnacion M.D. [facility] 06/08/2023, 1:29 PM  Available via Epic secure chat 7am-7pm After 7 pm, please refer to night coverage provider listed on amion.

## 2023-06-08 NOTE — Progress Notes (Signed)
Moon was given to the patient, as well daughter VIA phone.    [place] Masyn Fullam LCSW-A   06/08/2023 9:23 AM

## 2023-06-08 NOTE — Progress Notes (Signed)
Reached out to Dr. Isidoro Donning concerning her CBG. Advised to increase tube feeding @40cc /hr for 4hrs then increase to 50cc/hr at 1300 and again increase to 60cc/hr if pt. Is tolerating without nausea and vomiting.

## 2023-06-08 NOTE — Progress Notes (Signed)
Dr. Isidoro Donning notified of Pt's potassium of 2.6. New orders received.

## 2023-06-09 DIAGNOSIS — G8191 Hemiplegia, unspecified affecting right dominant side: Secondary | ICD-10-CM | POA: Diagnosis not present

## 2023-06-09 DIAGNOSIS — K567 Ileus, unspecified: Secondary | ICD-10-CM | POA: Diagnosis not present

## 2023-06-09 DIAGNOSIS — R140 Abdominal distension (gaseous): Secondary | ICD-10-CM | POA: Diagnosis not present

## 2023-06-09 DIAGNOSIS — S31000A Unspecified open wound of lower back and pelvis without penetration into retroperitoneum, initial encounter: Secondary | ICD-10-CM | POA: Diagnosis not present

## 2023-06-09 LAB — GLUCOSE, CAPILLARY
Glucose-Capillary: 125 mg/dL — ABNORMAL HIGH (ref 70–99)
Glucose-Capillary: 59 mg/dL — ABNORMAL LOW (ref 70–99)
Glucose-Capillary: 77 mg/dL (ref 70–99)
Glucose-Capillary: 79 mg/dL (ref 70–99)
Glucose-Capillary: 79 mg/dL (ref 70–99)
Glucose-Capillary: 81 mg/dL (ref 70–99)
Glucose-Capillary: 87 mg/dL (ref 70–99)

## 2023-06-09 LAB — BASIC METABOLIC PANEL
Anion gap: 6 (ref 5–15)
BUN: 17 mg/dL (ref 8–23)
CO2: 24 mmol/L (ref 22–32)
Calcium: 7.9 mg/dL — ABNORMAL LOW (ref 8.9–10.3)
Chloride: 105 mmol/L (ref 98–111)
Creatinine, Ser: 0.53 mg/dL (ref 0.44–1.00)
GFR, Estimated: >60 mL/min (ref >60)
Glucose, Bld: 118 mg/dL — ABNORMAL HIGH (ref 70–99)
Potassium: 3.2 mmol/L — ABNORMAL LOW (ref 3.5–5.1)
Sodium: 135 mmol/L (ref 135–145)

## 2023-06-09 LAB — PHOSPHORUS: Phosphorus: 2.2 mg/dL — ABNORMAL LOW (ref 2.5–4.6)

## 2023-06-09 LAB — MAGNESIUM: Magnesium: 2 mg/dL (ref 1.7–2.4)

## 2023-06-09 MED ORDER — ONDANSETRON HCL 4 MG PO TABS: 4.0000 mg | ORAL_TABLET | Freq: Four times a day (QID) | ORAL | Status: DC | PRN

## 2023-06-09 MED ORDER — JUVEN PO PACK: 1.0000 | PACK | Freq: Two times a day (BID) | ORAL | Status: DC

## 2023-06-09 MED ORDER — POTASSIUM PHOSPHATES 15 MMOLE/5ML IV SOLN
30.0000 mmol | Freq: Once | INTRAVENOUS | Status: AC
  Administered 2023-06-09: 30 mmol via INTRAVENOUS
  Filled 2023-06-09: qty 10

## 2023-06-09 MED ORDER — GLUCAGON HCL RDNA (DIAGNOSTIC) 1 MG IJ SOLR
1.0000 mg | INTRAMUSCULAR | Status: AC
Start: 2023-06-09 — End: 2023-06-09
  Administered 2023-06-09: 1 mg via INTRAMUSCULAR
  Filled 2023-06-09: qty 1

## 2023-06-09 MED ORDER — ASCORBIC ACID 500 MG PO TABS: 250.0000 mg | ORAL_TABLET | Freq: Two times a day (BID) | ORAL | Status: DC

## 2023-06-09 MED ORDER — MUSCLE RUB 10-15 % EX CREA: 1.0000 | TOPICAL_CREAM | Freq: Two times a day (BID) | CUTANEOUS | Status: DC

## 2023-06-09 MED ORDER — OSMOLITE 1.5 CAL PO LIQD: 1000.0000 mL | ORAL | Status: DC

## 2023-06-09 MED ORDER — POLYETHYLENE GLYCOL 3350 17 G PO PACK: 34.0000 g | PACK | Freq: Every day | ORAL | Status: DC

## 2023-06-09 MED ORDER — PHENYTOIN 125 MG/5ML PO SUSP: 25.0000 mg | Freq: Two times a day (BID) | ORAL | Status: DC

## 2023-06-09 NOTE — Discharge Summary (Signed)
Physician Discharge Summary   Patient: Kathleen Jensen MRN: [id number] DOB: [birth year]  Admit date:     06/06/2023  Discharge date: 06/09/23  Discharge Physician: Thad Ranger, MD    PCP: System, Provider Not In   Recommendations at discharge:    Osmolite 1.5 @ 65 ml/hr x 18 hours, holding 1 hour before and 2 hours after each dilantin administration PRO Source TF20 60 ml BID  Free water flushes of 150 ml every 4 hours  1 packet Juven BID per tube to support wound healing  Per outpatient neurology, Dr Angelyn Punt, patient is currently on tapering dose of Dilantin 25 mg twice daily via tube, to continue for 2 more days till 06/11/2023 then off.  Once patient has completed the Dilantin (phenytoin) taper, please have dietitian check her tube feeding regimen and adjust accordingly. 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  Per daughter, patient had recent abdominal distention and was diagnosed with ileus at the facility.  Patient was placed on simethicone for treatment.  Abdominal distention progressed and repeat x-ray showed a possible obstruction and she was sent to ED for evaluation.  In ED, she had documented bowel movement. CTAP showed diffuse dilation of the colon with air-fluid levels without transition point, stable percutaneous jejunostomy tube, low-density lesion in left kidney, uterine fibroids. General  surgery was consulted.  Assessment and Plan:    Ileus (HCC), abdominal distention with jejunostomy tube in place -CT scan showed colonic distention without definitive transition point -Seen by general surgery, per evaluation, likely has some element of chronic ileus and recommended aggressive bowel regimen including scheduled simethicone -Constipation resolved, had BM -Recommend good bowel regimen, avoid narcotics    Moderate protein calorie malnutrition, dysphagia, PEG tube dependent -Ileus has improved, tube feeding resumed, Patient was followed by dietitian, recommended Osmolite 1.5 @ 65 ml/hr x 18 hours, holding 1 hour before and 2 hours after each dilantin administration Note that patient will complete the phenytoin taper on 06/11/2023.  Patient will need to have the dietitian or provider to readjust her tube feeds regimen Continue Prosource TF20 60 ml BID, Free water flushes of 150 ml every 4 hours, 1 packet Juven BID per tube to support wound healing    Hypokalemia, hypophosphatemia -Potassium 3.2, phosphorus 2.2 -Replaced IV with K-Phos   History of prior CVA with residual right hemiplegia, expressive aphasia, PEG tube dependent -Continue statin, aspirin     PAF (paroxysmal atrial fibrillation) (HCC), SSS status post pacemaker -Continue amiodarone, Lopressor - not on anticoagulation     History of seizure -Continue Dilantin currently on taper, will complete on 06/11/2023 -Continue Depakote and Keppra -Outpatient follow-up with her neurologist, Dr. Angelyn Punt at [facility].   History of hypothyroidism -Synthroid discontinued in the past -TSH 4.5, free T4 0.9   Thrombocytopenia Platelet down to 131 from 149 2 months ago.     Kidney lesion CT A/P today showed stable indeterminate low-density lesion in the left kidney. Kidney function normal. -  Renal ultrasound showed 4.1 cm cyst in the left kidney, no follow-up imaging recommended, no hydronephrosis    Hypertension Stable, continue Lopressor   Pressure injury -Coccyx medial stage IV, left thigh posterior stage II, POA -Wound care per nursing   Estimated body mass index is 30.21 kg/m as calculated from the following:   Height as of this encounter: 5\' 10"  (1.778 m).   Weight as of this encounter: 95.5 kg.      Pain control - [employer] Controlled Substance Reporting System database was reviewed. and patient was instructed, not to drive, operate heavy machinery, perform activities at heights, swimming or participation in water activities or provide baby-sitting services while on Pain, Sleep and Anxiety Medications; until their outpatient Physician has advised to do so again. Also recommended to not to take more than prescribed Pain, Sleep and Anxiety Medications.  Consultants: General Surgery Procedures performed: None Disposition: Skilled nursing facility Diet recommendation: Tube feeds per instructions  DISCHARGE MEDICATION: Allergies as of 06/09/2023       Reactions   Tape Rash   Demerol [meperidine Hcl] Nausea And Vomiting   Sulfonamide Derivatives Itching   Bacitracin-polymyxin B Dermatitis   Oxycontin [oxycodone] Itching        Medication List     STOP taking these medications    HYDROcodone-acetaminophen 5-325 MG tablet Commonly known as: NORCO/VICODIN   Zinc 50 MG Tabs       TAKE these medications    acetaminophen 160 MG/5ML solution Commonly known as: TYLENOL Place 20 mLs (640 mg total) into feeding tube every 8 (eight) hours. What changed:  how much to take when to take this   amiodarone 200 MG tablet Commonly known as: PACERONE Place 1 tablet (200 mg total) into feeding tube daily.   amLODipine 10 MG tablet Commonly known as: NORVASC Place 1 tablet (10 mg total) into feeding tube daily. What changed:  when to take this additional instructions   ascorbic acid 500 MG tablet Commonly known as: VITAMIN C Place 0.5 tablets (250 mg total)  into feeding tube 2 (two) times daily. What changed: how much to take   aspirin 81 MG chewable tablet Place 81 mg into feeding tube at bedtime.   Biofreeze 4 % Gel Generic drug: Menthol (Topical Analgesic) Apply 2 g topically See admin instructions. Apply 2 grams topically to all limbs 2 times a day   chlorhexidine 0.12 % solution Commonly known as: PERIDEX 15 mLs by Mouth Rinse route See admin instructions. Dab a toothbrush into 15 ml's and brush teeth 2 times a day   Cranberry 500 MG Caps Place 500 mg into feeding tube 2 (two) times daily.   cyclobenzaprine 5 MG tablet Commonly known as: FLEXERIL Place 2.5 mg into feeding tube in the morning and at bedtime. Hold If Sedated   estradiol 0.1 MG/GM vaginal cream Commonly known as: ESTRACE Place vaginally See admin instructions. Apply a pea-sized amount to the urethra/vaginal wall on Tuesdays and Fridays   famotidine 40 MG/5ML suspension Commonly known as: PEPCID Place 40 mg into feeding tube daily.   feeding supplement (OSMOLITE 1.5 CAL) Liqd Place 1,000 mLs into feeding tube continuous. Osmolite 1.5 @ 65 ml/hr x 18 hours, holding 1 hour before and 2 hours after each dilantin administration   nutrition supplement (JUVEN) Pack Place 1 packet into feeding tube 2 (two) times daily.   folic acid 800 MCG tablet Commonly known as: FOLVITE Place 800 mcg into feeding tube daily.   levETIRAcetam 100 MG/ML solution  Commonly known as: KEPPRA Place 1,000 mg into feeding tube 2 (two) times daily.   losartan 25 MG tablet Commonly known as: COZAAR Place 1 tablet (25 mg total) into feeding tube daily.   metoprolol tartrate 25 MG tablet Commonly known as: LOPRESSOR Place 1 tablet (25 mg total) into feeding tube 2 (two) times daily. What changed:  how much to take when to take this additional instructions   ondansetron 4 MG tablet Commonly known as: ZOFRAN Place 1 tablet (4 mg total) into feeding tube every 6 (six) hours as  needed for nausea or vomiting.   phenytoin 125 MG/5ML suspension Commonly known as: DILANTIN Place 1 mL (25 mg total) into feeding tube 2 (two) times daily for 2 days. Stop date on 06/11/2023 What changed:  how much to take additional instructions   polyethylene glycol 17 g packet Commonly known as: MIRALAX / GLYCOLAX Place 34 g into feeding tube daily. What changed:  when to take this reasons to take this   senna-docusate 8.6-50 MG tablet Commonly known as: Senokot-S Place 2 tablets into feeding tube 2 (two) times daily.   simethicone 80 MG chewable tablet Commonly known as: MYLICON Place 80 mg into feeding tube 2 (two) times daily.   simvastatin 10 MG tablet Commonly known as: ZOCOR Place 1 tablet (10 mg total) into feeding tube at bedtime.   torsemide 10 MG tablet Commonly known as: DEMADEX Place 10 mg into feeding tube See admin instructions. 10 mg per tube in the morning and hold for a systolic reading less than 110   valproic acid 250 MG/5ML solution Commonly known as: DEPAKENE Place 500-750 mg into feeding tube See admin instructions. 500 mg via tube three times a day and 750 mg at bedtime               Discharge Care Instructions  (From admission, onward)           Start     Ordered   06/09/23 0000  If the dressing is still on your incision site when you go home, remove it on the third day after your surgery date. Remove dressing if it begins to fall off, or if it is dirty or damaged before the third day.        06/09/23 1120            Contact information for follow-up providers     Angelyn Punt. Schedule an appointment as soon as possible for a visit in 2 week(s).   Why: for hospital follow-up Contact information: [address] [place] [address] [phone]              Contact information for after-discharge care     Destination     [facility], [facility] Preferred SNF .   Service: Skilled  Nursing Contact information: [address] [place] [address] [phone]                    Discharge Exam: Filed Weights   06/06/23 2048 06/08/23 0432 06/09/23 0500  Weight: 94.9 kg 95.5 kg 97.3 kg   S: Had a BM, currently tube feeds near goal.  No acute issues overnight.  No nausea vomiting, chest pain or shortness of breath.  BP 124/72 (BP Location: Right Arm)   Pulse (!) 59   Temp 97.7 F (36.5 C)   Resp 16   Ht 5\' 10"  (1.778 m)   Wt 97.3 kg   SpO2 96%  BMI 30.78 kg/m   Physical Exam General: Alert and oriented, has expressive aphasia Cardiovascular: S1 S2 clear, RRR.  Respiratory: CTAB, no wheezing Gastrointestinal: Soft, nontender, PEG tube Ext: no pedal edema bilaterally Neuro: right-sided hemiplegia Psych: chronic expressive aphasia   Condition at discharge: fair  The results of significant diagnostics from this hospitalization (including imaging, microbiology, ancillary and laboratory) are listed below for reference.   Imaging Studies: DG Abd Portable 1V  Result Date: 06/07/2023 CLINICAL DATA:  Ileus. EXAM: PORTABLE ABDOMEN - 1 VIEW COMPARISON:  March 01, 2023.  June 06, 2023. FINDINGS: Jejunostomy tube is again noted in left upper quadrant. Metallic linear and curvilinear densities are seen in the right-sided the abdomen which were present on prior radiographs as well as CT scans. These appear to represent ingested foreign bodies sitting within the right colon. Stable mild gaseous distention of the colon is noted most consistent with ileus. IMPRESSION: Stable mild gaseous distention of the colon is noted most consistent with ileus. Metallic linear and curvilinear densities are seen in right side of abdomen most consistent with ingested foreign bodies within the right colon based on prior CT scans. Electronically Signed   By: Lupita Raider M.D.   On: 06/07/2023 08:46   US RENAL  Result Date: 06/06/2023 CLINICAL DATA:  Left kidney  lesion. EXAM: RENAL / URINARY TRACT ULTRASOUND COMPLETE COMPARISON:  CT abdomen and pelvis 06/06/2023. FINDINGS: Right Kidney: Renal measurements: 10.1 x 4.7 x 4.2 cm = volume: 105 mL. Echogenicity within normal limits. No mass or hydronephrosis visualized. Left Kidney: Renal measurements: 9.6 x 5.0 x 3.6 cm = volume: 90.8 mL. Echogenicity within normal limits. There is no hydronephrosis. There is a 4.1 x 2.5 x 3.1 cm cyst in the left kidney corresponding to CT finding. Bladder: Appears normal for degree of bladder distention. Other: None. IMPRESSION: 1. 4.1 cm cyst in the left kidney corresponding to CT finding. No follow-up imaging recommended. 2. No hydronephrosis. Electronically Signed   By: Darliss Cheney M.D.   On: 06/06/2023 23:34   CT ABDOMEN PELVIS WO CONTRAST  Result Date: 06/06/2023 CLINICAL DATA:  Bowel obstruction EXAM: CT ABDOMEN AND PELVIS WITHOUT CONTRAST TECHNIQUE: Multidetector CT imaging of the abdomen and pelvis was performed following the standard protocol without IV contrast. RADIATION DOSE REDUCTION: This exam was performed according to the departmental dose-optimization program which includes automated exposure control, adjustment of the mA and/or kV according to patient size and/or use of iterative reconstruction technique. COMPARISON:  CT abdomen and pelvis 11/19/2022 FINDINGS: Lower chest: No acute abnormality. Hepatobiliary: No focal liver abnormality is seen. Status post cholecystectomy. No biliary dilatation. Pancreas: Unremarkable. No pancreatic ductal dilatation or surrounding inflammatory changes. Spleen: Normal in size without focal abnormality. Adrenals/Urinary Tract: The bladder is decompressed by Foley catheter. Rounded low-density lesion in the left kidney measures 3.5 cm, unchanged. This measures 30 Hounsfield units. There is no hydronephrosis or perinephric stranding. The adrenal glands are within normal limits. Stomach/Bowel: The colon is diffusely dilated with air-fluid  levels. No definitive transition point seen. No focal inflammation or wall thickening seen. The appendix and small bowel are within normal limits. There surgical changes in the stomach. Percutaneous jejunostomy tube in place similar to prior. Vascular/Lymphatic: Aortic atherosclerosis. No enlarged abdominal or pelvic lymph nodes. Reproductive: There are calcified uterine fibroids measuring up to 3 cm. Adnexa are unremarkable. Other: No abdominal wall hernia or abnormality. No abdominopelvic ascites. Musculoskeletal: L4-5 posterior fusion hardware present. No acute fractures are seen. IMPRESSION: 1. Diffuse dilatation  of the colon with air-fluid levels. No definitive transition point seen. Findings may represent ileus or colitis. 2. Stable percutaneous jejunostomy tube. 3. Stable indeterminate low-density lesion in the left kidney. Recommend further evaluation with renal ultrasound. 4. Uterine fibroids. 5. Aortic atherosclerosis. Aortic Atherosclerosis (ICD10-I70.0). Electronically Signed   By: Darliss Cheney M.D.   On: 06/06/2023 16:16    Microbiology: Results for orders placed or performed during the hospital encounter of 03/09/23  Blood culture (routine x 2)     Status: None   Collection Time: 03/09/23 10:49 PM   Specimen: BLOOD  Result Value Ref Range Status   Specimen Description BLOOD LEFT ARM  Final   Special Requests   Final    IV BOTTLES DRAWN AEROBIC AND ANAEROBIC Blood Culture adequate volume   Culture   Final    NO GROWTH 5 DAYS Performed at [facility], [address], [place], [place] [address]    Report Status 03/14/2023 FINAL  Final    Labs: CBC: Recent Labs  Lab 06/06/23 1320  WBC 6.9  NEUTROABS 2.5  HGB 14.6  HCT 44.8  MCV 101.6*  PLT 131*   Basic Metabolic Panel: Recent Labs  Lab 06/06/23 1320 06/06/23 1748 06/07/23 0845 06/07/23 1707 06/08/23 0754 06/08/23 1659 06/09/23 0606  NA 137  --  140  --  137  --  135  K 3.6  --  3.4*  --  2.6*  --  3.2*  CL  96*  --  105  --  102  --  105  CO2 35*  --  29  --  27  --  24  GLUCOSE 67*  --  81  --  89  --  118*  BUN 35*  --  27*  --  23  --  17  CREATININE 0.60  --  0.56  --  0.64  --  0.53  CALCIUM 8.4*  --  7.8*  --  7.8*  --  7.9*  MG  --  2.3 1.9  --  1.9  --  2.0  PHOS  --  3.9  --  3.3 3.5 2.8 2.2*   Liver Function Tests: Recent Labs  Lab 06/06/23 1320  AST 70*  ALT 32  ALKPHOS 197*  BILITOT 0.5  PROT 9.2*  ALBUMIN 2.6*   CBG: Recent Labs  Lab 06/08/23 1550 06/08/23 2018 06/09/23 0010 06/09/23 0419 06/09/23 0743  GLUCAP 73 111* 77 81 87    Discharge time spent: greater than 30 minutes.  Signed: Thad Ranger, MD [facility] 06/09/2023

## 2023-06-09 NOTE — TOC Transition Note (Signed)
Transition of Care [facility]) - CM/SW Discharge Note   Patient Details  Name: Kathleen Jensen MRN: [id number] Date of Birth: [birth year]  Transition of Care [facility]) CM/SW Contact:  Amada Jupiter, LCSW Phone Number: 06/09/2023, 4:25 PM   Clinical Narrative:     Pt medically cleared for return to [facility] LTC bed today after IV medication delivery completed.  Family aware and agreeable.  Facility aware will be evening admit.  PT[facility] called with request for 7:30pm pick up.  RN to call report to [phone].  No further TOC needs.  Final next level of care: Skilled Nursing Facility Barriers to Discharge: Barriers Resolved   Patient Goals and CMS Choice      Discharge Placement                Patient chooses bed at: [facility] Patient to be transferred to facility by: PT[facility] Name of family member notified: message left for daughter, Kathleen Jensen and spoke with Kathleen Jensen Patient and family notified of of transfer: 06/09/23  Discharge Plan and Services Additional resources added to the After Visit Summary for                  DME Arranged: N/A DME Agency: NA                  Social Determinants of Health (SDOH) Interventions SDOH Screenings   Food Insecurity: No Food Insecurity (06/06/2023)  Housing: Low Risk  (06/06/2023)  Transportation Needs: No Transportation Needs (06/06/2023)  Utilities: Not At Risk (06/06/2023)  Depression (PHQ2-9): Low Risk  (05/22/2019)  Tobacco Use: Medium Risk (06/06/2023)     Readmission Risk Interventions    02/18/2022    2:23 PM  Readmission Risk Prevention Plan  Transportation Screening Complete  PCP or Specialist Appt within 5-7 Days Complete  Home Care Screening Complete  Medication Review (RN CM) Complete

## 2023-06-09 NOTE — Progress Notes (Signed)
PTAR here to get the patient for [facility] and the patient left for the facility.

## 2023-06-09 NOTE — TOC Progression Note (Signed)
Transition of Care [facility]) - Progression Note    Patient Details  Name: Kathleen Jensen MRN: [id number] Date of Birth: 1951-07-27  [facility] of [facility]) CM/SW Contact  Amada Jupiter, LCSW Phone Number: 06/09/2023, 10:09 AM  Clinical Narrative:     Alerted this morning that pt may be medically ready for dc today.  Confirmed with pt's daughter, [name], that plan is for patient to return to her LTC bed at [facility].  Have confirmed with facility that they are ready to accept patient back anytime.  Await word from MD on dc clearance.       Expected Discharge Plan and Services                                               Social Determinants of Health (SDOH) Interventions SDOH Screenings   Food Insecurity: No Food Insecurity (06/06/2023)  Housing: Low Risk  (06/06/2023)  Transportation Needs: No Transportation Needs (06/06/2023)  Utilities: Not At Risk (06/06/2023)  Depression (PHQ2-9): Low Risk  (05/22/2019)  Tobacco Use: Medium Risk (06/06/2023)    Readmission Risk Interventions    02/18/2022    2:23 PM  Readmission Risk Prevention Plan  Transportation Screening Complete  PCP or Specialist Appt within 5-7 Days Complete  Home Care Screening Complete  Medication Review (RN CM) Complete

## 2023-06-09 NOTE — Progress Notes (Signed)
[facility] place and report given to RN.

## 2023-06-09 NOTE — [place] FL2 (Signed)
MEDICAID FL2 LEVEL OF CARE FORM     IDENTIFICATION  Patient Name: Kathleen Jensen Birthdate: [birth year] Sex: female Admission Date (Current Location): 06/06/2023  [place] Number:  Haynes Bast [id number] Facility and Address:  [facility],  [address], [place] [address]      Provider Number: [id number]  Attending Physician Name and Address:  Cathren Harsh, MD  Relative Name and Phone Number:  daughter, [name] @ [phone]    Current Level of Care: Hospital Recommended Level of Care: Skilled Nursing Facility Prior Approval Number:    Date Approved/Denied:   PASRR Number: [id number]  Discharge Plan: SNF    Current Diagnoses: Patient Active Problem List   Diagnosis Date Noted   Ileus (HCC) 06/06/2023   Acute cystitis 03/01/2023   Delirium secondary to UTI 03/01/2023   Atrial fibrillation, chronic (HCC) 03/01/2023   History of seizure 03/01/2023   Chronic hyponatremia 03/01/2023   Acute respiratory failure (HCC) 11/16/2022   Abdominal distension 11/16/2022   AMS (altered mental status) 05/17/2022   Altered mental status 05/15/2022   Sacral wound 05/15/2022   Intracranial hemorrhage (HCC)    Pressure injury of skin 02/18/2022   Chronic indwelling Foley catheter 02/18/2022   PAF (paroxysmal atrial fibrillation) (HCC) 02/18/2022   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HCC) 02/18/2022   Septic shock (HCC) 02/15/2022   Constipation    Abdominal pain    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   History of right hemiplegia (HCC) 04/12/2020   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   Hyperlipidemia associated with type 2 diabetes mellitus (HCC) 07/30/2018   History of CVA (cerebrovascular accident) 07/30/2018   Vitamin B 12 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/30/2018   Asthma 03/13/2018   Seizures (HCC) 01/12/2018   CVA (cerebral vascular accident) (HCC) 12/08/2017   Abnormal thyroid blood  test 12/08/2017   Pulmonary embolism and infarction (HCC) 12/08/2017   Chronic anemia 12/08/2017   History of expressive aphasia 10/15/2017   Seizure disorder (HCC) 10/15/2017   Dysphagia 10/15/2017   Hyperlipidemia 09/22/2017   Urinary frequency 09/22/2017   Airway hyperreactivity 01/26/2016   Disease of thyroid gland 01/26/2016   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   Other specified postprocedural states 01/26/2016   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   Chronic low back pain 04/10/2015   Degeneration of intervertebral disc of lumbar region 04/10/2015   Spondylolisthesis of lumbar region 04/10/2015   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   History of cardiac pacemaker in situ 01/09/2015   B12 DEFICIENCY 08/15/2008   Vitamin D deficiency 08/15/2008   Sick sinus syndrome (HCC) 08/15/2008   OBSTRUCTIVE SLEEP APNEA 08/11/2008   INSOMNIA 04/28/2008   Hyperlipidemia LDL goal <70 12/12/2006   RHINITIS, ALLERGIC NEC 12/12/2006   Hypothyroidism 12/11/2006   Essential hypertension 12/11/2006   GERD 12/11/2006   STRESS INCONTINENCE 12/11/2006    Orientation RESPIRATION BLADDER Height & Weight     Self  Normal Incontinent, Indwelling catheter Weight: 214 lb 8.1 oz (97.3 kg) Height:  5\' 10"  (177.8 cm)  BEHAVIORAL SYMPTOMS/MOOD NEUROLOGICAL BOWEL NUTRITION STATUS      Incontinent Feeding tube  AMBULATORY STATUS COMMUNICATION OF NEEDS Skin   Total Care Verbally PU Stage and Appropriate Care     PU Stage 3 Dressing: Daily (sacral foam dressing)                 Personal Care Assistance Level of Assistance  Bathing, Feeding, Dressing, Total care Bathing Assistance: Limited assistance Feeding assistance: Limited  assistance Dressing Assistance: Limited assistance Total Care Assistance: Maximum assistance   Functional Limitations Info  Sight, Hearing, Speech Sight Info: Adequate Hearing Info: Adequate Speech Info: Adequate    SPECIAL CARE FACTORS FREQUENCY  PT (By  licensed PT)                    Contractures Contractures Info: Not present    Additional Factors Info  Code Status, Allergies Code Status Info: Full Allergies Info: Tape, Demerol (Meperidine Hcl), Sulfonamide Derivatives, Bacitracin-polymyxin B, Oxycontin (Oxycodone)           Current Medications (06/09/2023):  This is the current hospital active medication list Current Facility-Administered Medications  Medication Dose Route Frequency Provider Last Rate Last Admin   acetaminophen (TYLENOL) 160 MG/5ML solution 800 mg  800 mg Per Tube Q6H Steffanie Rainwater, MD   800 mg at 06/09/23 0813   acetaminophen (TYLENOL) tablet 650 mg  650 mg Oral Q6H PRN Steffanie Rainwater, MD       Or   acetaminophen (TYLENOL) suppository 650 mg  650 mg Rectal Q6H PRN Steffanie Rainwater, MD       amiodarone (PACERONE) tablet 200 mg  200 mg Per Tube Daily Steffanie Rainwater, MD   200 mg at 06/09/23 0818   amLODipine (NORVASC) tablet 10 mg  10 mg Per Tube Daily Steffanie Rainwater, MD   10 mg at 06/09/23 1610   ascorbic acid (VITAMIN C) tablet 250 mg  250 mg Per Tube BID Rai, Ripudeep K, MD   250 mg at 06/09/23 0815   aspirin chewable tablet 81 mg  81 mg Per Tube QHS Steffanie Rainwater, MD   81 mg at 06/08/23 2128   chlorhexidine (PERIDEX) 0.12 % solution 15 mL  15 mL Mouth Rinse BID Steffanie Rainwater, MD   15 mL at 06/09/23 9604   Chlorhexidine Gluconate Cloth 2 % PADS 6 each  6 each Topical Daily Rai, Ripudeep K, MD   6 each at 06/09/23 0946   cyclobenzaprine (FLEXERIL) tablet 2.5 mg  2.5 mg Per J Tube TID Steffanie Rainwater, MD   2.5 mg at 06/09/23 0813   enoxaparin (LOVENOX) injection 40 mg  40 mg Subcutaneous Q24H Steffanie Rainwater, MD   40 mg at 06/08/23 2128   famotidine (PEPCID) 40 MG/5ML suspension 40 mg  40 mg Per Tube Daily Steffanie Rainwater, MD   40 mg at 06/09/23 0819   feeding supplement (OSMOLITE 1.5 CAL) liquid 1,000 mL  1,000 mL Per Tube Continuous Rai, Ripudeep K, MD 20 mL/hr  at 06/07/23 1252 1,000 mL at 06/07/23 1252   folic acid (FOLVITE) tablet 1 mg  1 mg Per Tube Daily Steffanie Rainwater, MD   1 mg at 06/09/23 0813   levETIRAcetam (KEPPRA) 100 MG/ML solution 1,000 mg  1,000 mg Per Tube BID Steffanie Rainwater, MD   1,000 mg at 06/09/23 0817   losartan (COZAAR) tablet 25 mg  25 mg Per Tube Daily Steffanie Rainwater, MD   25 mg at 06/09/23 0817   metoprolol tartrate (LOPRESSOR) tablet 37.5 mg  37.5 mg Per Tube BID Steffanie Rainwater, MD   37.5 mg at 06/09/23 5409   Muscle Rub CREA   Topical BID Steffanie Rainwater, MD   Given at 06/09/23 0820   ondansetron (ZOFRAN) tablet 4 mg  4 mg Oral Q6H PRN Steffanie Rainwater, MD       Or   ondansetron [facility]) injection  4 mg  4 mg Intravenous Q6H PRN Steffanie Rainwater, MD       phenytoin (DILANTIN) 125 MG/5ML suspension 25 mg  25 mg Per Tube BID Steffanie Rainwater, MD   25 mg at 06/09/23 0817   polyethylene glycol (MIRALAX / GLYCOLAX) packet 17 g  17 g Per Tube Daily Rai, Ripudeep K, MD   17 g at 06/09/23 0819   senna-docusate (Senokot-S) tablet 1 tablet  1 tablet Oral QHS PRN Steffanie Rainwater, MD       senna-docusate (Senokot-S) tablet 2 tablet  2 tablet Per Tube BID Steffanie Rainwater, MD   2 tablet at 06/09/23 0816   simethicone (MYLICON) chewable tablet 80 mg  80 mg Per Tube BID Steffanie Rainwater, MD   80 mg at 06/09/23 0814   simvastatin (ZOCOR) tablet 10 mg  10 mg Per Tube QHS Steffanie Rainwater, MD   10 mg at 06/08/23 2128   torsemide (DEMADEX) tablet 10 mg  10 mg Per Tube Daily Steffanie Rainwater, MD   10 mg at 06/09/23 0816   valproic acid (DEPAKENE) 250 MG/5ML solution 500 mg  500 mg Per Tube TID with meals Steffanie Rainwater, MD   500 mg at 06/09/23 0818   And   valproic acid (DEPAKENE) 250 MG/5ML solution 750 mg  750 mg Per Tube QHS Steffanie Rainwater, MD   750 mg at 06/08/23 2125     Discharge Medications: Please see discharge summary for a list of discharge medications.  Relevant Imaging  Results:  Relevant Lab Results:   Additional Information SS#: [id number]  Amada Jupiter, LCSW

## 2023-06-09 NOTE — Progress Notes (Signed)
Patient CBG @ 2000: 59 asymptomatic. Hypoglycemic Standing Order initiated and patient given Glucagon per standing order and CBG 15 minute recheck: 79

## 2023-06-09 NOTE — Plan of Care (Signed)
  Problem: Activity: Goal: Risk for activity intolerance will decrease Outcome: Progressing   Problem: Safety: Goal: Ability to remain free from injury will improve Outcome: Progressing   Problem: Pain Management: Goal: General experience of comfort will improve Outcome: Progressing

## 2023-06-09 NOTE — Progress Notes (Signed)
Nutrition Follow-up  DOCUMENTATION CODES:   Not applicable  INTERVENTION:  - Goal TF regimen as below. Patient to discharge back to SNF today. - Osmolite 1.5 @ 65 ml/hr x 18 hours, holding 1 hour before and 2 hours after each dilantin administration - PROSource TF20 60 ml BID - Free water flushes of 150 ml every 4 hours   Recommended tube feeding regimen at goal rate with recommended free water flushes would provide 1915 kcal, 113 grams of protein, and 1791 ml of H2O.   - Monitor magnesium, potassium, and phosphorus MD to replete as needed.   - Checking both zinc and copper labs as it appears pt has been on zinc supplementation since last admission in August 2024.  - Currently pending   - Checking vitamin C lab as it appears pt has been on vitamin C supplementation since last admission in August 2024  - Pending   - Decrease vitamin C to 250 mg BID per tube, recommend d/c vitamin C if lab comes back high   NUTRITION DIAGNOSIS:   Inadequate oral intake related to inability to eat as evidenced by NPO status. *ongoing  GOAL:   Patient will meet greater than or equal to 90% of their needs *progressing, on TF  MONITOR:   Labs, Weight trends, TF tolerance, Skin, I & O's  REASON FOR ASSESSMENT:   Consult Assessment of nutrition requirement/status, Enteral/tube feeding initiation and management (trickle tube feeds via J-tube)  ASSESSMENT:   72 year old female who presented to the ED from SNF on 11/08 with abdominal distention. PMH of stroke with expressive aphasia and right-sided hemiplegia, HTN, GERD, seizures, CKD, chronic atrial fibrillation, sick sinus syndrome s/p pacemaker, s/p J-tube placement in 2023 for failure to thrive requiring multiple replacements by IR, gastric bypass in 2008.  Patient sleeping at time of visit, did not awake to sound of voice. No family/visitors at bedside.  TF infusing at 94mL/hr at time of visit.   Per chart review, no significant changes  in weight over the past year. Patient noted to have aphasia and is J-tube dependent. Unable to determine tube feed regimen patient has been receiving at SNF.   Patient started on trickle feeds of Osmolite 1.5 on 11/9 at 12pm. Nothing charted in the [facility] since that time until 10am today.  Discussed with attending Dr. Isidoro Donning and she advanced patient to 45mL/hr yesterday.  Patient appears to be tolerating well but potassium and phosphorus low this AM so patient may be refeeding.  Per Dr. Isidoro Donning, patient to discharge back to SNF facility today. Discussed would recommend repletion of electrolytes prior to discharging and SNF will determine appropriate TF once she is back there.  Goal TF recs as above in interventions if needed.    Medications reviewed and include: 250mg  vitamin C BID, 1mg  folic acid, Dilantin BID, Mralax, Senokot  Labs reviewed:  K+ 3.2 Phosphorus 2.2  Vitamin D WNL Vitamin B12 HIGH Folate WNL Vitamin C: pending Copper: pending Zinc: pending   NUTRITION - FOCUSED PHYSICAL EXAM:  Unable to perform  Diet Order:   Diet Order     None       EDUCATION NEEDS:  No education needs have been identified at this time  Skin:  Skin Assessment: Skin Integrity Issues: Skin Integrity Issues:: Stage III Stage III: sacrum  Last BM:  11/11 - type 7  Height:  Ht Readings from Last 1 Encounters:  06/06/23 5\' 10"  (1.778 m)   Weight:  Wt Readings from Last 1 Encounters:  06/09/23 97.3 kg   Ideal Body Weight:  68.2 kg  BMI:  Body mass index is 30.78 kg/m.  Estimated Nutritional Needs:  Kcal:  1700-1900 Protein:  90-110 grams Fluid:  1.7-1.9 L    Shelle Iron RD, LDN For contact information, refer to [facility].

## 2023-06-10 LAB — COPPER, SERUM: Copper: 76 ug/dL — ABNORMAL LOW (ref 80–158)

## 2023-06-10 LAB — ZINC: Zinc: 36 ug/dL — ABNORMAL LOW (ref 44–115)

## 2023-06-13 LAB — VITAMIN C: Vitamin C: 0.9 mg/dL (ref 0.4–2.0)

## 2023-06-29 ENCOUNTER — Emergency Department (HOSPITAL_COMMUNITY): Payer: Medicare HMO

## 2023-06-29 ENCOUNTER — Emergency Department (HOSPITAL_COMMUNITY)
Admission: EM | Admit: 2023-06-29 | Discharge: 2023-06-29 | Disposition: A | Discharge status: Home / Self Care | Payer: Medicare HMO | Attending: Emergency Medicine | Admitting: Emergency Medicine

## 2023-06-29 DIAGNOSIS — Z8669 Personal history of other diseases of the nervous system and sense organs: Secondary | ICD-10-CM | POA: Diagnosis not present

## 2023-06-29 DIAGNOSIS — R5383 Other fatigue: Secondary | ICD-10-CM | POA: Insufficient documentation

## 2023-06-29 DIAGNOSIS — N189 Chronic kidney disease, unspecified: Secondary | ICD-10-CM | POA: Diagnosis not present

## 2023-06-29 DIAGNOSIS — Z7982 Long term (current) use of aspirin: Secondary | ICD-10-CM | POA: Insufficient documentation

## 2023-06-29 DIAGNOSIS — Z79899 Other long term (current) drug therapy: Secondary | ICD-10-CM | POA: Diagnosis not present

## 2023-06-29 DIAGNOSIS — J45909 Unspecified asthma, uncomplicated: Secondary | ICD-10-CM | POA: Diagnosis not present

## 2023-06-29 DIAGNOSIS — Z87898 Personal history of other specified conditions: Secondary | ICD-10-CM

## 2023-06-29 DIAGNOSIS — R4182 Altered mental status, unspecified: Secondary | ICD-10-CM | POA: Diagnosis not present

## 2023-06-29 DIAGNOSIS — N390 Urinary tract infection, site not specified: Secondary | ICD-10-CM | POA: Insufficient documentation

## 2023-06-29 DIAGNOSIS — Z950 Presence of cardiac pacemaker: Secondary | ICD-10-CM | POA: Diagnosis not present

## 2023-06-29 DIAGNOSIS — E1122 Type 2 diabetes mellitus with diabetic chronic kidney disease: Secondary | ICD-10-CM | POA: Diagnosis not present

## 2023-06-29 DIAGNOSIS — I129 Hypertensive chronic kidney disease with stage 1 through stage 4 chronic kidney disease, or unspecified chronic kidney disease: Secondary | ICD-10-CM | POA: Diagnosis not present

## 2023-06-29 LAB — CBC WITH DIFFERENTIAL/PLATELET
Abs Immature Granulocytes: 0.02 10*3/uL (ref 0.00–0.07)
Basophils Absolute: 0.1 10*3/uL (ref 0.0–0.1)
Basophils Relative: 1 %
Eosinophils Absolute: 0.1 10*3/uL (ref 0.0–0.5)
Eosinophils Relative: 2 %
HCT: 39.4 % (ref 36.0–46.0)
Hemoglobin: 13 g/dL (ref 12.0–15.0)
Immature Granulocytes: 0 %
Lymphocytes Relative: 47 %
Lymphs Abs: 3.7 10*3/uL (ref 0.7–4.0)
MCH: 33.5 pg (ref 26.0–34.0)
MCHC: 33 g/dL (ref 30.0–36.0)
MCV: 101.5 fL — ABNORMAL HIGH (ref 80.0–100.0)
Monocytes Absolute: 0.9 10*3/uL (ref 0.1–1.0)
Monocytes Relative: 12 %
Neutro Abs: 3 10*3/uL (ref 1.7–7.7)
Neutrophils Relative %: 38 %
Platelets: 123 10*3/uL — ABNORMAL LOW (ref 150–400)
RBC: 3.88 MIL/uL (ref 3.87–5.11)
RDW: 15.6 % — ABNORMAL HIGH (ref 11.5–15.5)
WBC: 7.8 10*3/uL (ref 4.0–10.5)
nRBC: 0 % (ref 0.0–0.2)

## 2023-06-29 LAB — URINALYSIS, W/ REFLEX TO CULTURE (INFECTION SUSPECTED)
Bilirubin Urine: NEGATIVE
Glucose, UA: NEGATIVE mg/dL
Hgb urine dipstick: NEGATIVE
Ketones, ur: NEGATIVE mg/dL
Nitrite: POSITIVE — AB
Protein, ur: NEGATIVE mg/dL
Specific Gravity, Urine: 1.019 (ref 1.005–1.030)
WBC, UA: >50 WBC/hpf (ref 0–5)
pH: 6 (ref 5.0–8.0)

## 2023-06-29 LAB — LIPASE, BLOOD: Lipase: 35 U/L (ref 11–51)

## 2023-06-29 LAB — COMPREHENSIVE METABOLIC PANEL
ALT: 27 U/L (ref 0–44)
AST: 72 U/L — ABNORMAL HIGH (ref 15–41)
Albumin: 2.3 g/dL — ABNORMAL LOW (ref 3.5–5.0)
Alkaline Phosphatase: 134 U/L — ABNORMAL HIGH (ref 38–126)
Anion gap: 10 (ref 5–15)
BUN: 31 mg/dL — ABNORMAL HIGH (ref 8–23)
CO2: 30 mmol/L (ref 22–32)
Calcium: 8.2 mg/dL — ABNORMAL LOW (ref 8.9–10.3)
Chloride: 95 mmol/L — ABNORMAL LOW (ref 98–111)
Creatinine, Ser: 0.7 mg/dL (ref 0.44–1.00)
GFR, Estimated: >60 mL/min (ref >60)
Glucose, Bld: 67 mg/dL — ABNORMAL LOW (ref 70–99)
Potassium: 3.9 mmol/L (ref 3.5–5.1)
Sodium: 135 mmol/L (ref 135–145)
Total Bilirubin: 0.6 mg/dL (ref <1.2)
Total Protein: 8.3 g/dL — ABNORMAL HIGH (ref 6.5–8.1)

## 2023-06-29 LAB — VALPROIC ACID LEVEL: Valproic Acid Lvl: 47 ug/mL — ABNORMAL LOW (ref 50.0–100.0)

## 2023-06-29 LAB — CBG MONITORING, ED
Glucose-Capillary: 47 mg/dL — ABNORMAL LOW (ref 70–99)
Glucose-Capillary: 61 mg/dL — ABNORMAL LOW (ref 70–99)
Glucose-Capillary: 159 mg/dL — ABNORMAL HIGH (ref 70–99)

## 2023-06-29 MED ORDER — CEFTRIAXONE SODIUM 1 G IJ SOLR
1.0000 g | Freq: Once | INTRAMUSCULAR | Status: AC
  Administered 2023-06-29: 1 g via INTRAVENOUS
  Filled 2023-06-29: qty 10

## 2023-06-29 MED ORDER — DEXTROSE 50 % IV SOLN
1.0000 | Freq: Once | INTRAVENOUS | Status: AC
Start: 2023-06-29 — End: 2023-06-29
  Administered 2023-06-29: 50 mL via INTRAVENOUS

## 2023-06-29 MED ORDER — GLUCOSE 40 % PO GEL
ORAL | Status: AC
  Filled 2023-06-29: qty 1.21

## 2023-06-29 MED ORDER — VALPROATE SODIUM 100 MG/ML IV SOLN
500.0000 mg | Freq: Once | INTRAVENOUS | Status: AC
  Administered 2023-06-29: 500 mg via INTRAVENOUS
  Filled 2023-06-29: qty 5

## 2023-06-29 MED ORDER — GLUCOSE 40 % PO GEL
1.0000 | Freq: Once | ORAL | Status: AC
  Administered 2023-06-29: 31 g via ORAL

## 2023-06-29 MED ORDER — LEVETIRACETAM IN NACL 1000 MG/100ML IV SOLN
1000.0000 mg | Freq: Once | INTRAVENOUS | Status: AC
  Administered 2023-06-29: 1000 mg via INTRAVENOUS
  Filled 2023-06-29: qty 100

## 2023-06-29 MED ORDER — IOHEXOL 350 MG/ML SOLN
75.0000 mL | Freq: Once | INTRAVENOUS | Status: AC | PRN
  Administered 2023-06-29: 75 mL via INTRAVENOUS

## 2023-06-29 NOTE — ED Provider Notes (Signed)
[place] EMERGENCY DEPARTMENT AT [facility] Provider Note   CSN: [id number] Arrival date & time: 06/29/23  0533     History  Chief Complaint  Patient presents with   Weakness   Seizures    Kathleen Jensen is a 72 y.o. female.  HPI     This is a 72 year old female with a history of CVA with hemiaplasia, seizure disorder, paroxysmal A-fib, gastrostomy tube who presents with concerns for lethargy.  Staff noted lethargy beginning yesterday.  Upon EMS arrival, they noted a 22nd seizure.  She is supposed to be on Keppra and Depakote.  She does have a history of seizures.  Per EMS she was postictal.  She is able to tell me her name but does not provide further history.  She does indicate that her abdomen is tender when I press.  No reported nausea or vomiting.  Level 5 caveat  Home Medications Prior to Admission medications   Medication Sig Start Date End Date Taking? Authorizing Provider  acetaminophen (TYLENOL) 160 MG/5ML solution Place 20 mLs (640 mg total) into feeding tube every 8 (eight) hours. Patient taking differently: Place 800 mg into feeding tube every 6 (six) hours. 12/03/22   Elgergawy, Leana Roe, MD  amiodarone (PACERONE) 200 MG tablet Place 1 tablet (200 mg total) into feeding tube daily. 12/03/22   Elgergawy, Leana Roe, MD  amLODipine (NORVASC) 10 MG tablet Place 1 tablet (10 mg total) into feeding tube daily. Patient taking differently: Place 10 mg into feeding tube See admin instructions. 10 mg via tube once a day and hold for a Systolic reading less than 110 03/06/23   Marinda Elk, MD  ascorbic acid (VITAMIN C) 500 MG tablet Place 0.5 tablets (250 mg total) into feeding tube 2 (two) times daily. 06/09/23   Rai, Delene Ruffini, MD  aspirin 81 MG chewable tablet Place 81 mg into feeding tube at bedtime.    [provider]  chlorhexidine (PERIDEX) 0.12 % solution 15 mLs by Mouth Rinse route See admin instructions. Dab a toothbrush into 15 ml's  and brush teeth 2 times a day    [provider]  Cranberry 500 MG CAPS Place 500 mg into feeding tube 2 (two) times daily.    [provider]  cyclobenzaprine (FLEXERIL) 5 MG tablet Place 2.5 mg into feeding tube in the morning and at bedtime. Hold If Sedated    [provider]  estradiol (ESTRACE) 0.1 MG/GM vaginal cream Place vaginally See admin instructions. Apply a pea-sized amount to the urethra/vaginal wall on Tuesdays and Fridays    [provider]  famotidine (PEPCID) 40 MG/5ML suspension Place 40 mg into feeding tube daily. 03/25/22   [provider]  folic acid (FOLVITE) 800 MCG tablet Place 800 mcg into feeding tube daily.    [provider]  levETIRAcetam (KEPPRA) 100 MG/ML solution Place 1,000 mg into feeding tube 2 (two) times daily.    [provider]  losartan (COZAAR) 25 MG tablet Place 1 tablet (25 mg total) into feeding tube daily. 03/06/23   Marinda Elk, MD  Menthol, Topical Analgesic, (BIOFREEZE) 4 % GEL Apply 2 g topically See admin instructions. Apply 2 grams topically to all limbs 2 times a day    [provider]  metoprolol tartrate (LOPRESSOR) 25 MG tablet Place 1 tablet (25 mg total) into feeding tube 2 (two) times daily. Patient taking differently: Place 37.5 mg into feeding tube See admin instructions. 37.5 mg per tube  twice a day and hold for a Systolic reading less than 120 or a heart rate less than 60 12/03/22   Elgergawy, Leana Roe, MD  nutrition supplement, JUVEN, (JUVEN) PACK Place 1 packet into feeding tube 2 (two) times daily. 06/09/23   Rai, Delene Ruffini, MD  Nutritional Supplements (FEEDING SUPPLEMENT, OSMOLITE 1.5 CAL,) LIQD Place 1,000 mLs into feeding tube continuous. Osmolite 1.5 @ 65 ml/hr x 18 hours, holding 1 hour before and 2 hours after each dilantin administration 06/09/23   Rai, Ripudeep K, MD  ondansetron (ZOFRAN) 4 MG tablet Place 1 tablet (4 mg total) into feeding tube every 6  (six) hours as needed for nausea or vomiting. 06/09/23   Rai, Delene Ruffini, MD  phenytoin (DILANTIN) 125 MG/5ML suspension Place 1 mL (25 mg total) into feeding tube 2 (two) times daily for 2 days. Stop date on 06/11/2023 06/09/23 06/11/23  Rai, Delene Ruffini, MD  polyethylene glycol (MIRALAX / GLYCOLAX) 17 g packet Place 34 g into feeding tube daily. 06/09/23   Rai, Ripudeep K, MD  senna-docusate (SENOKOT-S) 8.6-50 MG tablet Place 2 tablets into feeding tube 2 (two) times daily. 02/22/22   Glade Lloyd, MD  simethicone (MYLICON) 80 MG chewable tablet Place 80 mg into feeding tube 2 (two) times daily.    [provider]  simvastatin (ZOCOR) 10 MG tablet Place 1 tablet (10 mg total) into feeding tube at bedtime. 02/22/22   Glade Lloyd, MD  torsemide (DEMADEX) 10 MG tablet Place 10 mg into feeding tube See admin instructions. 10 mg per tube in the morning and hold for a systolic reading less than 110    [provider]  valproic acid (DEPAKENE) 250 MG/5ML solution Place 500-750 mg into feeding tube See admin instructions. 500 mg via tube three times a day and 750 mg at bedtime    [provider]      Allergies    Tape, Demerol [meperidine hcl], Sulfonamide derivatives, Bacitracin-polymyxin b, and Oxycontin [oxycodone]    Review of Systems   Review of Systems  Unable to perform ROS: Acuity of condition    Physical Exam Updated Vital Signs BP (!) 151/69   Pulse 66   Temp 98.2 F (36.8 C) (Rectal)   Resp 18   SpO2 98%  Physical Exam Vitals and nursing note reviewed.  Constitutional:      Appearance: She is well-developed. She is obese.     Comments: Chronically ill-appearing, no acute distress  HENT:     Head: Normocephalic and atraumatic.     Mouth/Throat:     Mouth: Mucous membranes are moist.  Eyes:     Pupils: Pupils are equal, round, and reactive to light.  Cardiovascular:     Rate and Rhythm: Normal rate and regular rhythm.     Heart sounds: Normal  heart sounds.  Pulmonary:     Effort: Pulmonary effort is normal. No respiratory distress.     Breath sounds: No wheezing.  Abdominal:     General: Bowel sounds are normal. There is distension.     Palpations: Abdomen is soft.     Tenderness: There is abdominal tenderness. There is no guarding or rebound.     Comments: Distention noticed, generalized tenderness to palpation, GJ tube in place  Musculoskeletal:     Cervical back: Neck supple.     Comments: Bilateral Posey boots  Skin:    General: Skin is warm and dry.  Neurological:     Mental Status: She is alert.  Comments: Oriented to person     ED Results / Procedures / Treatments   Labs (all labs ordered are listed, but only abnormal results are displayed) Labs Reviewed  VALPROIC ACID LEVEL - Abnormal; Notable for the following components:      Result Value   Valproic Acid Lvl 47 (*)    All other components within normal limits  CBC WITH DIFFERENTIAL/PLATELET - Abnormal; Notable for the following components:   MCV 101.5 (*)    RDW 15.6 (*)    Platelets 123 (*)    All other components within normal limits  COMPREHENSIVE METABOLIC PANEL - Abnormal; Notable for the following components:   Chloride 95 (*)    Glucose, Bld 67 (*)    BUN 31 (*)    Calcium 8.2 (*)    Total Protein 8.3 (*)    Albumin 2.3 (*)    AST 72 (*)    Alkaline Phosphatase 134 (*)    All other components within normal limits  URINALYSIS, W/ REFLEX TO CULTURE (INFECTION SUSPECTED) - Abnormal; Notable for the following components:   APPearance HAZY (*)    Nitrite POSITIVE (*)    Leukocytes,Ua LARGE (*)    Bacteria, UA RARE (*)    All other components within normal limits  CULTURE, BLOOD (ROUTINE X 2)  CULTURE, BLOOD (ROUTINE X 2)  URINE CULTURE  LIPASE, BLOOD    EKG EKG Interpretation Date/Time:  Sunday June 29 2023 05:38:38 EST Ventricular Rate:  63 PR Interval:  94 QRS Duration:  87 QT Interval:  468 QTC Calculation: 480 R  Axis:   -22  Text Interpretation: Sinus rhythm Short PR interval Left ventricular hypertrophy Confirmed by Ross Marcus ([id number]) on 06/29/2023 6:15:59 AM  Radiology CT Head Wo Contrast  Result Date: 06/29/2023 CLINICAL DATA:  Mental status change with unknown cause. Lethargy and witness seizure. EXAM: CT HEAD WITHOUT CONTRAST TECHNIQUE: Contiguous axial images were obtained from the base of the skull through the vertex without intravenous contrast. RADIATION DOSE REDUCTION: This exam was performed according to the departmental dose-optimization program which includes automated exposure control, adjustment of the mA and/or kV according to patient size and/or use of iterative reconstruction technique. COMPARISON:  03/09/2023 FINDINGS: Brain: No evidence of acute infarction, hemorrhage, hydrocephalus, extra-axial collection or mass lesion/mass effect. Moderate area of encephalomalacia in the left parietal cortex. Chronic lacunar infarcts in the pons and right thalamus. Generalized brain atrophy. Vascular: No hyperdense vessel or unexpected calcification. Skull: Normal. Negative for fracture or focal lesion. Sinuses/Orbits: No acute finding. IMPRESSION: 1. No acute or reversible finding. 2. Generalized atrophy with chronic ischemic injury as described. A chronic left parietal infarct affects cortex. Electronically Signed   By: Tiburcio Pea M.D.   On: 06/29/2023 06:38   DG Chest Portable 1 View  Result Date: 06/29/2023 CLINICAL DATA:  Altered mental status and seizure EXAM: PORTABLE CHEST 1 VIEW COMPARISON:  03/09/2023 FINDINGS: Artifact from EKG leads. Dual-chamber pacer leads from the left in stable position. Low volume lungs. There is no edema, consolidation, effusion, or pneumothorax. Normal heart size and mediastinal contours. IMPRESSION: Low volume but clear lungs. Electronically Signed   By: Tiburcio Pea M.D.   On: 06/29/2023 06:05    Procedures Procedures    Medications Ordered in  ED Medications  cefTRIAXone (ROCEPHIN) 1 g in sodium chloride 0.9 % 100 mL IVPB (has no administration in time range)  valproate (DEPACON) 500 mg in dextrose 5 % 50 mL IVPB (has no administration in time range)  ED Course/ Medical Decision Making/ A&P Clinical Course as of 06/29/23 0981  Wynelle Link Jun 29, 2023  1914 Workup notable for nitrite positive urine although this is from a catheterized specimen.  Patient has a chronic indwelling catheter.  Will send culture.  Given reports of altered mental status, will give Rocephin and elect to treat.  Depakote level is slightly low.  Patient was given 500 IV.  Otherwise her workup appears at baseline.  Based on chart review and discussion with EMS and caregivers, she is at her baseline.  She is awake.  She has some aphasia and hemiplegia at baseline. [CH]    Clinical Course User Index [CH] Senaya Dicenso, Mayer Masker, MD                                 Medical Decision Making Amount and/or Complexity of Data Reviewed Labs: ordered. Radiology: ordered.   This patient presents to the ED for concern of altered mental status/seizure, this involves an extensive number of treatment options, and is a complaint that carries with it a high risk of complications and morbidity.  I considered the following differential and admission for this acute, potentially life threatening condition.  The differential diagnosis includes metabolic derangement, acute illness such as infection or UTI, CVA  MDM:    This is a 72 year old female with complex medical history including CVA, hemiaplasia, aphasia who presents with concerns for altered mental status, lethargy, seizure.  She is overall at her baseline.  She has aphasia so is difficult to get history from.  Reportedly at her baseline.  Recent history of an ileus.  She does have a slight distention of her abdomen with some diffuse tenderness.  Labs obtained.  Labs are largely at the patient's baseline.  Depakote level is slightly  low.  This was replaced IV.  Urinalysis is nitrate positive.  This was from a chronic indwelling Foley.  Will send culture and treat given acute illness.  CT scan does not show any intracranial abnormalities.  On recheck, patient remained stable.  Hemodynamics are normal.   (Labs, imaging, consults)  Labs: I Ordered, and personally interpreted labs.  The pertinent results include:  CBC, CMP, lipase, urinalysis,  Imaging Studies ordered: I ordered imaging studies including chest x-ray, CT head I independently visualized and interpreted imaging. I agree with the radiologist interpretation  Additional history obtained from chart review.  External records from outside source obtained and reviewed including prior evaluations  Cardiac Monitoring: The patient was maintained on a cardiac monitor.  If on the cardiac monitor, I personally viewed and interpreted the cardiac monitored which showed an underlying rhythm of: Sinus  Reevaluation: After the interventions noted above, I reevaluated the patient and found that they have :improved  Social Determinants of Health:  lives in facility  Disposition: Pending  Co morbidities that complicate the patient evaluation  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)    Hyperlipemia    Hypertension    Insomnia    Intracerebral hemorrhage, intraventricular (HCC)  Muscle weakness    Other abnormalities of gait and mobility    Seizures (HCC)    Stroke (HCC)    Unsteadiness on feet    Vitamin B deficiency      Medicines Meds ordered this encounter   Medications   cefTRIAXone (ROCEPHIN) 1 g in sodium chloride 0.9 % 100 mL IVPB    Order Specific Question:   Antibiotic Indication:    Answer:   UTI   valproate (DEPACON) 500 mg in dextrose 5 % 50 mL IVPB    I have reviewed the patients home medicines and have made adjustments as needed  Problem List / ED Course: Problem List Items Addressed This Visit       Other   Altered mental status - Primary   History of seizure   Other Visit Diagnoses     Complicated UTI (urinary tract infection)       Relevant Medications   cefTRIAXone (ROCEPHIN) 1 g in sodium chloride 0.9 % 100 mL IVPB                   Final Clinical Impression(s) / ED Diagnoses Final diagnoses:  Altered mental status, unspecified altered mental status type  Complicated UTI (urinary tract infection)  History of seizure    Rx / DC Orders ED Discharge Orders     None         Katelen Luepke, Mayer Masker, MD 06/29/23 [phone]

## 2023-06-29 NOTE — ED Notes (Signed)
Ptar called no eta given

## 2023-06-29 NOTE — ED Notes (Signed)
Patient changed in new brief and placed onto a new clean sheet.

## 2023-06-29 NOTE — ED Notes (Signed)
Dr.Ray notified of pt CBG. Oral glucose given. Pt remains alert at baseline. Tolerating glucose gel well. Will recheck CBG.

## 2023-06-29 NOTE — ED Triage Notes (Signed)
Pt in from [facility] via [facility] with lethargy and witnessed seizure. Per EMS, is a longterm hemiplegic pt at the facility, has indwelling foley and Gtube. Staff noticed dark urine and lethargy last night, EMS called out for this. They arrive and witnessed a 20-sec seizure, hx of seizures. Baseline a&ox3 per staff

## 2023-06-29 NOTE — ED Notes (Signed)
Stuck twice for second blood culture,unable to obtain

## 2023-06-29 NOTE — Discharge Instructions (Signed)
Please continue patient's home seizure medications as prescribed.  Have her follow-up with her doctor for recheck this week Urinalysis was sent from Foley catheter and a culture will be pending please have her doctor recheck this to evaluate if or if she needs antibiotics Patient's blood sugar has been somewhat low here.  She has not been getting her jejunal feeds.  We she was given some IV blood sugar.  Please check her blood sugars frequently up to every 2 hours and give her her usual jejunal feeds. She can return to the emergency department if there are any new or worsening symptoms.

## 2023-06-29 NOTE — ED Notes (Signed)
Seizure pads applied to bedframe

## 2023-06-29 NOTE — ED Notes (Addendum)
Attempted calling pt legal guardian x 5 times with no answer. Attempted calling 2nd emergency contact, Kathleen Jensen who is pt daughter as well. Voicemail left for legal guardian. Will attempt to call again. This RN notified [facility] of pt discharge. Waiting for [facility] to transport pt back to [facility].

## 2023-06-29 NOTE — ED Provider Notes (Signed)
  Physical Exam  BP 121/70   Pulse 61   Temp 98.2 F (36.8 C) (Rectal)   Resp 16   SpO2 98%   Physical Exam  Procedures  Procedures  ED Course / MDM   Clinical Course as of 06/29/23 0958  [facility] Jun 29, 2023  1308 Workup notable for nitrite positive urine although this is from a catheterized specimen.  Patient has a chronic indwelling catheter.  Will send culture.  Given reports of altered mental status, will give Rocephin and elect to treat.  Depakote level is slightly low.  Patient was given 500 IV.  Otherwise her workup appears at baseline.  Based on chart review and discussion with EMS and caregivers, she is at her baseline.  She is awake.  She has some aphasia and hemiplegia at baseline. [CH]  [phone] CT reviewed that reveals postsurgical changes from gastric bypass with percutaneous jejunostomy tube in place no dilated loops of small bowel are seen.  They note similar prominent gaseous distention of the colon with liquid stool within the colon with no focal bowel wall thickening or inflammatory changes seen [DR]    Clinical Course User Index [CH] Horton, Mayer Masker, MD [DR] Margarita Grizzle, MD   Medical Decision Making Amount and/or Complexity of Data Reviewed Labs: ordered. Radiology: ordered.  Risk OTC drugs. Prescription drug management.  REceived from Dr. Wilkie Aye 72 yo female ho hemiplegia right side, g tube, foley from facility for lethargy with witnessed seizure by ems. HO pn depakote, keppra, depakote given here.  Unable to understand at baseline but appears at baseline. Urine being cultured CT abdomen pending due to recent ileus and somegrimacing on exam. Likely d/c if stable after ct  Given D50 a.m. blood sugar up to 157.  She is not on any insulin or medications to induce hypoglycemia.  I suspect that this is secondary to not having her jejunal feeds.  She is to be discharged back to her facility and they will be able to follow her blood sugars they are with her jejunal  feeds in place.      Margarita Grizzle, MD 06/29/23 [phone]

## 2023-06-30 LAB — URINE CULTURE

## 2023-07-01 ENCOUNTER — Telehealth (HOSPITAL_COMMUNITY): Payer: Self-pay

## 2023-07-01 LAB — BLOOD CULTURE ID PANEL (REFLEXED) - BCID2

## 2023-07-02 LAB — CULTURE, BLOOD (ROUTINE X 2): Culture  Setup Time: NO GROWTH

## 2023-07-03 ENCOUNTER — Telehealth (HOSPITAL_BASED_OUTPATIENT_CLINIC_OR_DEPARTMENT_OTHER): Payer: Self-pay | Admitting: *Deleted

## 2023-07-03 NOTE — Telephone Encounter (Signed)
Post ED Visit - Positive Culture Follow-up  Culture report reviewed by antimicrobial stewardship pharmacist: [facility] [x]  Daylene Posey, Pharm.D. []  Celedonio Miyamoto, Pharm.D., BCPS AQ-ID []  Garvin Fila, Pharm.D., BCPS []  Georgina Pillion, [address].D., BCPS []  [place], [address].D., BCPS, AAHIVP []  Estella Husk, Pharm.D., BCPS, AAHIVP []  Lysle Pearl, PharmD, BCPS []  Phillips Climes, PharmD, BCPS []  Agapito Games, PharmD, BCPS []  Verlan Friends, PharmD []  Mervyn Gay, PharmD, BCPS []  Vinnie Level, PharmD  [facility] []  Len Childs, PharmD []  Greer Pickerel, PharmD []  Adalberto Cole, PharmD []  Perlie Gold, Rph []  Lonell Face) Jean Rosenthal, PharmD []  Earl Many, PharmD []  Junita Push, PharmD []  Dorna Leitz, PharmD []  Terrilee Files, PharmD []  Lynann Beaver, PharmD []  Keturah Barre, PharmD []  Loralee Pacas, PharmD []  Bernadene Person, PharmD   Positive blood culture Likely contaminant and no further patient follow-up is required at this time.  Virl Axe [facility] 07/03/2023, 8:39 AM

## 2023-07-04 LAB — CULTURE, BLOOD (ROUTINE X 2): Culture: NO GROWTH

## 2023-07-14 ENCOUNTER — Other Ambulatory Visit: Payer: Self-pay

## 2023-07-14 ENCOUNTER — Encounter (HOSPITAL_COMMUNITY): Payer: Self-pay

## 2023-07-14 ENCOUNTER — Emergency Department (HOSPITAL_COMMUNITY): Payer: Medicare HMO

## 2023-07-14 ENCOUNTER — Emergency Department (HOSPITAL_COMMUNITY)
Admission: EM | Admit: 2023-07-14 | Discharge: 2023-07-14 | Disposition: A | Discharge status: Skilled Nursing Facility | Payer: Medicare HMO | Attending: Student | Admitting: Student

## 2023-07-14 DIAGNOSIS — Z79899 Other long term (current) drug therapy: Secondary | ICD-10-CM | POA: Insufficient documentation

## 2023-07-14 DIAGNOSIS — Z7982 Long term (current) use of aspirin: Secondary | ICD-10-CM | POA: Diagnosis not present

## 2023-07-14 DIAGNOSIS — J45909 Unspecified asthma, uncomplicated: Secondary | ICD-10-CM | POA: Diagnosis not present

## 2023-07-14 DIAGNOSIS — R404 Transient alteration of awareness: Secondary | ICD-10-CM | POA: Insufficient documentation

## 2023-07-14 DIAGNOSIS — I4891 Unspecified atrial fibrillation: Secondary | ICD-10-CM | POA: Diagnosis not present

## 2023-07-14 DIAGNOSIS — N189 Chronic kidney disease, unspecified: Secondary | ICD-10-CM | POA: Diagnosis not present

## 2023-07-14 DIAGNOSIS — E039 Hypothyroidism, unspecified: Secondary | ICD-10-CM | POA: Diagnosis not present

## 2023-07-14 DIAGNOSIS — K567 Ileus, unspecified: Secondary | ICD-10-CM | POA: Insufficient documentation

## 2023-07-14 DIAGNOSIS — Z950 Presence of cardiac pacemaker: Secondary | ICD-10-CM | POA: Insufficient documentation

## 2023-07-14 DIAGNOSIS — R1084 Generalized abdominal pain: Secondary | ICD-10-CM

## 2023-07-14 DIAGNOSIS — R109 Unspecified abdominal pain: Secondary | ICD-10-CM | POA: Diagnosis present

## 2023-07-14 DIAGNOSIS — I129 Hypertensive chronic kidney disease with stage 1 through stage 4 chronic kidney disease, or unspecified chronic kidney disease: Secondary | ICD-10-CM | POA: Insufficient documentation

## 2023-07-14 DIAGNOSIS — N3090 Cystitis, unspecified without hematuria: Secondary | ICD-10-CM | POA: Insufficient documentation

## 2023-07-14 DIAGNOSIS — Z7901 Long term (current) use of anticoagulants: Secondary | ICD-10-CM | POA: Insufficient documentation

## 2023-07-14 LAB — URINALYSIS, ROUTINE W REFLEX MICROSCOPIC
Bilirubin Urine: NEGATIVE
Glucose, UA: NEGATIVE mg/dL
Hgb urine dipstick: NEGATIVE
Ketones, ur: NEGATIVE mg/dL
Nitrite: NEGATIVE
Protein, ur: NEGATIVE mg/dL
Specific Gravity, Urine: 1.016 (ref 1.005–1.030)
WBC, UA: >50 WBC/hpf (ref 0–5)
pH: 5 (ref 5.0–8.0)

## 2023-07-14 LAB — CBC WITH DIFFERENTIAL/PLATELET
Abs Immature Granulocytes: 0.02 10*3/uL (ref 0.00–0.07)
Basophils Absolute: 0 10*3/uL (ref 0.0–0.1)
Basophils Relative: 1 %
Eosinophils Absolute: 0.1 10*3/uL (ref 0.0–0.5)
Eosinophils Relative: 2 %
HCT: 39.1 % (ref 36.0–46.0)
Hemoglobin: 13.1 g/dL (ref 12.0–15.0)
Immature Granulocytes: 0 %
Lymphocytes Relative: 50 %
Lymphs Abs: 3.5 10*3/uL (ref 0.7–4.0)
MCH: 34.8 pg — ABNORMAL HIGH (ref 26.0–34.0)
MCHC: 33.5 g/dL (ref 30.0–36.0)
MCV: 104 fL — ABNORMAL HIGH (ref 80.0–100.0)
Monocytes Absolute: 0.7 10*3/uL (ref 0.1–1.0)
Monocytes Relative: 11 %
Neutro Abs: 2.4 10*3/uL (ref 1.7–7.7)
Neutrophils Relative %: 36 %
Platelets: 123 10*3/uL — ABNORMAL LOW (ref 150–400)
RBC: 3.76 MIL/uL — ABNORMAL LOW (ref 3.87–5.11)
RDW: 15.3 % (ref 11.5–15.5)
WBC: 6.9 10*3/uL (ref 4.0–10.5)
nRBC: 0 % (ref 0.0–0.2)

## 2023-07-14 LAB — CBG MONITORING, ED: Glucose-Capillary: 89 mg/dL (ref 70–99)

## 2023-07-14 LAB — COMPREHENSIVE METABOLIC PANEL
ALT: 34 U/L (ref 0–44)
AST: 74 U/L — ABNORMAL HIGH (ref 15–41)
Albumin: 2.4 g/dL — ABNORMAL LOW (ref 3.5–5.0)
Alkaline Phosphatase: 127 U/L — ABNORMAL HIGH (ref 38–126)
Anion gap: 12 (ref 5–15)
BUN: 40 mg/dL — ABNORMAL HIGH (ref 8–23)
CO2: 28 mmol/L (ref 22–32)
Calcium: 8.5 mg/dL — ABNORMAL LOW (ref 8.9–10.3)
Chloride: 97 mmol/L — ABNORMAL LOW (ref 98–111)
Creatinine, Ser: 0.68 mg/dL (ref 0.44–1.00)
GFR, Estimated: >60 mL/min (ref >60)
Glucose, Bld: 86 mg/dL (ref 70–99)
Potassium: 3.5 mmol/L (ref 3.5–5.1)
Sodium: 137 mmol/L (ref 135–145)
Total Bilirubin: 0.5 mg/dL (ref <1.2)
Total Protein: 8.1 g/dL (ref 6.5–8.1)

## 2023-07-14 LAB — RAPID URINE DRUG SCREEN, HOSP PERFORMED
Amphetamines: NOT DETECTED
Barbiturates: NOT DETECTED
Benzodiazepines: NOT DETECTED
Cocaine: NOT DETECTED
Opiates: NOT DETECTED
Tetrahydrocannabinol: NOT DETECTED

## 2023-07-14 MED ORDER — IOHEXOL 300 MG/ML  SOLN
100.0000 mL | Freq: Once | INTRAMUSCULAR | Status: AC | PRN
  Administered 2023-07-14: 100 mL via INTRAVENOUS

## 2023-07-14 MED ORDER — ONDANSETRON HCL 4 MG/2ML IJ SOLN
4.0000 mg | Freq: Once | INTRAMUSCULAR | Status: AC
  Administered 2023-07-14: 4 mg via INTRAVENOUS
  Filled 2023-07-14: qty 2

## 2023-07-14 MED ORDER — FOSFOMYCIN TROMETHAMINE 3 G PO PACK
3.0000 g | PACK | Freq: Once | ORAL | Status: AC
  Administered 2023-07-14: 3 g via ORAL
  Filled 2023-07-14: qty 3

## 2023-07-14 MED ORDER — MORPHINE SULFATE (PF) 4 MG/ML IV SOLN
4.0000 mg | Freq: Once | INTRAVENOUS | Status: AC
  Administered 2023-07-14: 4 mg via INTRAVENOUS
  Filled 2023-07-14: qty 1

## 2023-07-14 NOTE — ED Triage Notes (Signed)
BIB EMS from [facility] for decline in mental status over the last 3 hours. Confused at baseline, doesn't normally answer orientation questions, but does hold conversation at baseline. Today pt is lethargic and difficult to arouse per EMS. Hx of UTIs causing same.

## 2023-07-14 NOTE — ED Provider Notes (Signed)
[place] EMERGENCY DEPARTMENT AT [facility] Provider Note  CSN: [id number] Arrival date & time: 07/14/23 1445  Chief Complaint(s) Altered Mental Status  HPI Kathleen Jensen is a 72 y.o. female with PMH CVA with right-sided hemiplegia, expressive aphasia, chronic A-fib not on anticoagulation, sick sinus syndrome status post pacemaker, seizure, HTN, hypothyroidism, GERD who presents emergency department for evaluation of altered mental status.  History obtained from providers at [facility] who states that over the last 3 hours she has been progressively more lethargic and difficult to arouse.  Facility has concern for UTI.  Patient is confused at baseline but is usually able to hold a conversation.  Facility staff also endorsing abnormal abdominal distention and patient does have known history of ileus with G-tube in place.  Here in the emergency room, patient is alert with significant expressive aphasia impeding additional history.   Past Medical History 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)    Hyperlipemia    Hypertension    Insomnia    Intracerebral hemorrhage, intraventricular (HCC)    Muscle weakness    Other abnormalities of gait and mobility    Seizures (HCC)    Stroke (HCC)    Unsteadiness on feet    Vitamin B deficiency    Patient Active Problem List   Diagnosis Date Noted   Ileus (HCC) 06/06/2023   Acute cystitis 03/01/2023   Delirium secondary to UTI 03/01/2023   Atrial fibrillation, chronic (HCC) 03/01/2023   History of seizure 03/01/2023   Chronic  hyponatremia 03/01/2023   Acute respiratory failure (HCC) 11/16/2022   Abdominal distension 11/16/2022   AMS (altered mental status) 05/17/2022   Altered mental status 05/15/2022   Sacral wound 05/15/2022   Intracranial hemorrhage (HCC)    Pressure injury of skin 02/18/2022   Chronic indwelling Foley catheter 02/18/2022   PAF (paroxysmal atrial fibrillation) (HCC) 02/18/2022   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HCC) 02/18/2022   Septic shock (HCC) 02/15/2022   Constipation    Abdominal pain    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   History of right hemiplegia (HCC) 04/12/2020   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   Hyperlipidemia associated with type 2 diabetes mellitus (HCC) 07/30/2018   History of CVA (cerebrovascular accident) 07/30/2018   Vitamin B 12 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/30/2018   Asthma 03/13/2018   Seizures (HCC) 01/12/2018   CVA (cerebral vascular accident) (HCC) 12/08/2017   Abnormal thyroid blood test 12/08/2017   Pulmonary embolism and infarction (HCC) 12/08/2017   Chronic anemia 12/08/2017   History of expressive aphasia 10/15/2017   Seizure disorder (HCC) 10/15/2017   Dysphagia 10/15/2017   Hyperlipidemia 09/22/2017   Urinary frequency 09/22/2017   Airway hyperreactivity 01/26/2016   Disease of thyroid gland 01/26/2016   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   Other specified postprocedural states 01/26/2016   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   Chronic low back pain 04/10/2015   Degeneration of intervertebral disc of lumbar region 04/10/2015   Spondylolisthesis of lumbar region 04/10/2015   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   History of cardiac pacemaker  in situ 01/09/2015   B12 DEFICIENCY 08/15/2008   Vitamin D deficiency 08/15/2008   Sick sinus syndrome (HCC) 08/15/2008   OBSTRUCTIVE SLEEP APNEA 08/11/2008   INSOMNIA 04/28/2008   Hyperlipidemia LDL goal <70 12/12/2006   RHINITIS, ALLERGIC NEC 12/12/2006    Hypothyroidism 12/11/2006   Essential hypertension 12/11/2006   GERD 12/11/2006   STRESS INCONTINENCE 12/11/2006   Home Medication(s) Prior to Admission medications   Medication Sig Start Date End Date Taking? Authorizing Provider  acetaminophen (TYLENOL) 160 MG/5ML solution Place 20 mLs (640 mg total) into feeding tube every 8 (eight) hours. Patient taking differently: Place 800 mg into feeding tube every 6 (six) hours. 12/03/22   Elgergawy, Leana Roe, MD  amiodarone (PACERONE) 200 MG tablet Place 1 tablet (200 mg total) into feeding tube daily. 12/03/22   Elgergawy, Leana Roe, MD  amLODipine (NORVASC) 10 MG tablet Place 1 tablet (10 mg total) into feeding tube daily. Patient taking differently: Place 10 mg into feeding tube See admin instructions. 10 mg via tube once a day and hold for a Systolic reading less than 110 03/06/23   Marinda Elk, MD  ascorbic acid (VITAMIN C) 500 MG tablet Place 0.5 tablets (250 mg total) into feeding tube 2 (two) times daily. 06/09/23   Rai, Delene Ruffini, MD  aspirin 81 MG chewable tablet Place 81 mg into feeding tube at bedtime.    [provider]  chlorhexidine (PERIDEX) 0.12 % solution Dab a toothbrush into 15 mLs and brush teeth 2 times a day    [provider]  Cranberry 500 MG CAPS Place 500 mg into feeding tube 2 (two) times daily. Patient not taking: Reported on 06/29/2023    [provider]  cyclobenzaprine (FLEXERIL) 5 MG tablet Place 2.5 mg into feeding tube in the morning and at bedtime. Hold If Sedated    [provider]  diazePAM, 20 MG Dose, (VALTOCO 20 MG DOSE) 2 x 10 MG/0.1ML LQPK Place 20 mg into the nose daily as needed (seizures). For up to 3 days.    [provider]  estradiol (ESTRACE) 0.1 MG/GM vaginal cream Place vaginally See admin instructions. Apply a pea-sized amount to the urethra/vaginal wall on Tuesdays and Fridays    [provider]  famotidine (PEPCID) 40 MG/5ML suspension Place  40 mg into feeding tube daily. 03/25/22   [provider]  folic acid (FOLVITE) 800 MCG tablet Place 800 mcg into feeding tube daily.    [provider]  HYDROcodone-acetaminophen (NORCO/VICODIN) 5-325 MG tablet Place 1 tablet into feeding tube 2 (two) times daily as needed for moderate pain (pain score 4-6).    [provider]  levETIRAcetam (KEPPRA) 100 MG/ML solution Place 1,000 mg into feeding tube 2 (two) times daily.    [provider]  losartan (COZAAR) 25 MG tablet Place 1 tablet (25 mg total) into feeding tube daily. 03/06/23   Marinda Elk, MD  Menthol, Topical Analgesic, (BIOFREEZE) 4 % GEL Apply 2 g topically See admin instructions. Apply 2 grams topically to all limbs 2 times a day    [provider]  metoprolol tartrate (LOPRESSOR) 25 MG tablet Place 1 tablet (25 mg total) into feeding tube 2 (two) times daily. 12/03/22   Elgergawy, Leana Roe, MD  nutrition supplement, JUVEN, (JUVEN) PACK Place 1 packet into feeding tube 2 (two) times daily. Patient not taking: Reported on 06/29/2023 06/09/23   Cathren Harsh, MD  Nutritional Supplements (FEEDING SUPPLEMENT, OSMOLITE 1.5 CAL,) LIQD Place  1,000 mLs into feeding tube continuous. Osmolite 1.5 @ 65 ml/hr x 18 hours, holding 1 hour before and 2 hours after each dilantin administration Patient not taking: Reported on 06/29/2023 06/09/23   Rai, Delene Ruffini, MD  ondansetron (ZOFRAN) 4 MG tablet Place 1 tablet (4 mg total) into feeding tube every 6 (six) hours as needed for nausea or vomiting. Patient not taking: Reported on 06/29/2023 06/09/23   Cathren Harsh, MD  phenytoin (DILANTIN) 125 MG/5ML suspension Place 1 mL (25 mg total) into feeding tube 2 (two) times daily for 2 days. Stop date on 06/11/2023 Patient not taking: Reported on 06/29/2023 06/09/23 06/11/23  Rai, Delene Ruffini, MD  polyethylene glycol (MIRALAX / GLYCOLAX) 17 g packet Place 34 g into feeding tube daily. 06/09/23   Rai, Ripudeep K,  MD  senna-docusate (SENOKOT-S) 8.6-50 MG tablet Place 2 tablets into feeding tube 2 (two) times daily. 02/22/22   Glade Lloyd, MD  simethicone (MYLICON) 80 MG chewable tablet Place 80 mg into feeding tube 3 (three) times daily.    [provider]  simvastatin (ZOCOR) 10 MG tablet Place 1 tablet (10 mg total) into feeding tube at bedtime. 02/22/22   Glade Lloyd, MD  torsemide (DEMADEX) 10 MG tablet Give 10 mg per tube in the morning and hold for a systolic reading less than 110    [provider]  valproic acid (DEPAKENE) 250 MG/5ML solution Place 500-750 mg into feeding tube See admin instructions. 500 mg via tube three times a day and 750 mg at bedtime    [provider]                                                                                                                                    Past Surgical History Past Surgical History:  Procedure Laterality Date   BACK SURGERY     Dec 18, 2016 Dr.Torrealba   BREAST BIOPSY Right    BREAST EXCISIONAL BIOPSY     CERVICAL SPINE SURGERY     C4   CESAREAN SECTION     GASTRIC BYPASS  03/2007   IR REPLC DUODEN/JEJUNO TUBE PERCUT W/FLUORO  01/27/2023   IR REPLC DUODEN/JEJUNO TUBE PERCUT W/FLUORO  03/07/2023   IR REPLC DUODEN/JEJUNO TUBE PERCUT W/FLUORO  04/30/2023   TONSILLECTOMY     Family History Family History  Problem Relation Age of Onset   High blood pressure Mother    Diabetes Mother    Diabetes Father    Stroke Father    Cancer Sister        Colorectal cancer   Cancer Brother        colon    Social History Social History   Tobacco Use   Smoking status: Former    Current packs/day: 0.00    Types: Cigarettes    Start date: 07/30/1975    Quit date: 07/30/1979    Years since quitting: 40.9  Smokeless tobacco: Never  Vaping Use   Vaping status: Never Used  Substance Use Topics   Alcohol use: No    Alcohol/week: 0.0 standard drinks of alcohol   Drug use: No   Allergies Tape, Demerol  [meperidine hcl], Sulfonamide derivatives, Bacitracin-polymyxin b, and Oxycontin [oxycodone]  Review of Systems Review of Systems  Unable to perform ROS: Mental status change    Physical Exam Vital Signs  I have reviewed the triage vital signs BP 128/76   Pulse 63   Temp 98.5 F (36.9 C) (Oral)   Resp 17   SpO2 94%   Physical Exam Vitals and nursing note reviewed.  Constitutional:      General: She is not in acute distress.    Appearance: She is well-developed.  HENT:     Head: Normocephalic and atraumatic.  Eyes:     Conjunctiva/sclera: Conjunctivae normal.  Cardiovascular:     Rate and Rhythm: Normal rate and regular rhythm.     Heart sounds: No murmur heard. Pulmonary:     Effort: Pulmonary effort is normal. No respiratory distress.     Breath sounds: Normal breath sounds.  Abdominal:     General: There is distension.     Palpations: Abdomen is soft.     Tenderness: There is no abdominal tenderness.  Musculoskeletal:        General: No swelling.     Cervical back: Neck supple.  Skin:    General: Skin is warm and dry.     Capillary Refill: Capillary refill takes less than 2 seconds.  Neurological:     Mental Status: She is alert.  Psychiatric:        Mood and Affect: Mood normal.     ED Results and Treatments Labs (all labs ordered are listed, but only abnormal results are displayed) Labs Reviewed  COMPREHENSIVE METABOLIC PANEL - Abnormal; Notable for the following components:      Result Value   Chloride 97 (*)    BUN 40 (*)    Calcium 8.5 (*)    Albumin 2.4 (*)    AST 74 (*)    Alkaline Phosphatase 127 (*)    All other components within normal limits  CBC WITH DIFFERENTIAL/PLATELET - Abnormal; Notable for the following components:   RBC 3.76 (*)    MCV 104.0 (*)    MCH 34.8 (*)    Platelets 123 (*)    All other components within normal limits  URINALYSIS, ROUTINE W REFLEX MICROSCOPIC - Abnormal; Notable for the following components:    APPearance CLOUDY (*)    Leukocytes,Ua LARGE (*)    Bacteria, UA RARE (*)    All other components within normal limits  URINE CULTURE  RAPID URINE DRUG SCREEN, HOSP PERFORMED  CBG MONITORING, ED                                                                                                                          Radiology CT Head  Wo Contrast Result Date: 07/14/2023 CLINICAL DATA:  Delirium EXAM: CT HEAD WITHOUT CONTRAST TECHNIQUE: Contiguous axial images were obtained from the base of the skull through the vertex without intravenous contrast. RADIATION DOSE REDUCTION: This exam was performed according to the departmental dose-optimization program which includes automated exposure control, adjustment of the mA and/or kV according to patient size and/or use of iterative reconstruction technique. COMPARISON:  06/29/2023 FINDINGS: Brain: No mass, hemorrhage or extra-axial collection. Generalized volume loss. Old left MCA territory infarct. Chronic small vessel ischemia. Vascular: There is atherosclerotic calcification of both internal carotid arteries at the skull base. Skull: Negative Sinuses/Orbits: Negative Other: None IMPRESSION: 1. No acute intracranial abnormality. 2. Old left MCA territory infarct. 3. Chronic small vessel ischemia and volume loss. Electronically Signed   By: Deatra Robinson M.D.   On: 07/14/2023 20:20    Pertinent labs & imaging results that were available during my care of the patient were reviewed by me and considered in my medical decision making (see MDM for details).  Medications Ordered in ED Medications  fosfomycin (MONUROL) packet 3 g (has no administration in time range)  morphine (PF) 4 MG/ML injection 4 mg (4 mg Intravenous Given 07/14/23 1821)  ondansetron (ZOFRAN) injection 4 mg (4 mg Intravenous Given 07/14/23 1821)  iohexol (OMNIPAQUE) 300 MG/ML solution 100 mL (100 mLs Intravenous Contrast Given 07/14/23 1946)                                                                                                                                      Procedures Procedures  (including critical care time)  Medical Decision Making / ED Course   This patient presents to the ED for concern of altered mental status, abdominal distention, this involves an extensive number of treatment options, and is a complaint that carries with it a high risk of complications and morbidity.  The differential diagnosis includes infection, metabolic/toxic encephalopathy, hypoglycemia, malperfusion, hypoxia, trauma or other intracranial process, obstruction, ileus  MDM: Patient seen emergency room for evaluation of abdominal pain, altered mental status.  Physical exam with abdominal distention but no significant tenderness to palpation.  It does appear the patient has returned to normal mental status baseline and is alert here in the ER.  This is confirmed by patient's family member who states that she has returned to normal mental status baseline.  Laboratory evaluation with an albumin of 2.4, AST 74 but is otherwise unremarkable.  Urinalysis with large leuk esterase, greater than 50 white blood cells and rare bacteria with white blood cell clumps.  Will cover with fosfomycin.  CT head unremarkable.  CT abdomen pelvis with chronic ileus.  This has been documented multiple times from previous presentations and appears unchanged.  In the setting of her known seizure disorder, suspect patient may have had a seizure earlier this afternoon and was postictal for nursing facility staff.  She is since returned to normal mental  status baseline and at this time does not meet inpatient criteria for admission.  She will be discharged back to her facility.   Additional history obtained: -Additional history obtained from family member -External records from outside source obtained and reviewed including: Chart review including previous notes, labs, imaging, consultation notes   Lab Tests: -I  ordered, reviewed, and interpreted labs.   The pertinent results include:   Labs Reviewed  COMPREHENSIVE METABOLIC PANEL - Abnormal; Notable for the following components:      Result Value   Chloride 97 (*)    BUN 40 (*)    Calcium 8.5 (*)    Albumin 2.4 (*)    AST 74 (*)    Alkaline Phosphatase 127 (*)    All other components within normal limits  CBC WITH DIFFERENTIAL/PLATELET - Abnormal; Notable for the following components:   RBC 3.76 (*)    MCV 104.0 (*)    MCH 34.8 (*)    Platelets 123 (*)    All other components within normal limits  URINALYSIS, ROUTINE W REFLEX MICROSCOPIC - Abnormal; Notable for the following components:   APPearance CLOUDY (*)    Leukocytes,Ua LARGE (*)    Bacteria, UA RARE (*)    All other components within normal limits  URINE CULTURE  RAPID URINE DRUG SCREEN, HOSP PERFORMED  CBG MONITORING, ED      EKG   EKG Interpretation Date/Time:  Monday July 14 2023 14:56:23 EST Ventricular Rate:  64 PR Interval:    QRS Duration:  95 QT Interval:  458 QTC Calculation: 473 R Axis:   -19  Text Interpretation: AV PACED RHYTHM Confirmed by Philipp Callegari ([id number]) on 07/14/2023 3:51:28 PM         Imaging Studies ordered: I ordered imaging studies including CT head, CT abdomen pelvis I independently visualized and interpreted imaging. I agree with the radiologist interpretation   Medicines ordered and prescription drug management: Meds ordered this encounter  Medications   morphine (PF) 4 MG/ML injection 4 mg   ondansetron (ZOFRAN) injection 4 mg   iohexol (OMNIPAQUE) 300 MG/ML solution 100 mL   fosfomycin (MONUROL) packet 3 g    -I have reviewed the patients home medicines and have made adjustments as needed  Critical interventions none  Cardiac Monitoring: The patient was maintained on a cardiac monitor.  I personally viewed and interpreted the cardiac monitored which showed an underlying rhythm of: NSR  Social Determinants of  Health:  Factors impacting patients care include: Lives in skilled nursing facility   Reevaluation: After the interventions noted above, I reevaluated the patient and found that they have :improved  Co morbidities that complicate the patient evaluation  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)    Hyperlipemia    Hypertension    Insomnia    Intracerebral hemorrhage, intraventricular (HCC)    Muscle weakness    Other abnormalities of gait and mobility    Seizures (HCC)    Stroke (HCC)    Unsteadiness on feet    Vitamin B deficiency       Dispostion:  I considered admission for this patient, but at this time she does not meet inpatient criteria for admission and will be discharged with outpatient follow-up     Final Clinical Impression(s) / ED Diagnoses Final diagnoses:  Transient alteration of awareness  Generalized abdominal pain  Ileus (HCC)  Cystitis     @PCDICTATION @    Glendora Score, MD 07/14/23 2226

## 2023-07-14 NOTE — ED Notes (Signed)
Called Lab to add urine culture  

## 2023-07-17 LAB — URINE CULTURE: Culture: >=100000 — AB

## 2023-07-18 NOTE — Telephone Encounter (Signed)
Post ED Visit - Positive Culture Follow-up  Culture report reviewed by antimicrobial stewardship pharmacist: [facility] []  Enzo Bi, Pharm.D. []  Celedonio Miyamoto, Pharm.D., BCPS AQ-ID []  Garvin Fila, Pharm.D., BCPS []  Georgina Pillion, Pharm.D., BCPS []  [place], [address].D., BCPS, AAHIVP []  Estella Husk, Pharm.D., BCPS, AAHIVP []  Lysle Pearl, PharmD, BCPS []  Phillips Climes, PharmD, BCPS []  Agapito Games, PharmD, BCPS []  Verlan Friends, PharmD []  Mervyn Gay, PharmD, BCPS []  Vinnie Level, PharmD  [facility] [x]  [name], PharmD []  Greer Pickerel, PharmD []  Adalberto Cole, PharmD []  Perlie Gold, Rph []  Lonell Face) Jean Rosenthal, PharmD []  Earl Many, PharmD []  Junita Push, PharmD []  Dorna Leitz, PharmD []  Terrilee Files, PharmD []  Lynann Beaver, PharmD []  Keturah Barre, PharmD []  Loralee Pacas, PharmD []  Bernadene Person, PharmD   Positive urine culture Treated Fosfomycin organism sensitive to the same and no further patient follow-up is required at this time.  Nena Polio Garner Nash 07/18/2023, 10:09 AM

## 2023-09-02 ENCOUNTER — Emergency Department (HOSPITAL_COMMUNITY): Payer: Medicare HMO

## 2023-09-02 ENCOUNTER — Other Ambulatory Visit: Payer: Self-pay

## 2023-09-02 ENCOUNTER — Emergency Department (HOSPITAL_COMMUNITY)
Admission: EM | Admit: 2023-09-02 | Discharge: 2023-09-02 | Disposition: A | Discharge status: Skilled Nursing Facility | Payer: Medicare HMO | Attending: Emergency Medicine | Admitting: Emergency Medicine

## 2023-09-02 ENCOUNTER — Encounter (HOSPITAL_COMMUNITY): Payer: Self-pay

## 2023-09-02 DIAGNOSIS — Z79899 Other long term (current) drug therapy: Secondary | ICD-10-CM | POA: Diagnosis not present

## 2023-09-02 DIAGNOSIS — I129 Hypertensive chronic kidney disease with stage 1 through stage 4 chronic kidney disease, or unspecified chronic kidney disease: Secondary | ICD-10-CM | POA: Insufficient documentation

## 2023-09-02 DIAGNOSIS — E1122 Type 2 diabetes mellitus with diabetic chronic kidney disease: Secondary | ICD-10-CM | POA: Diagnosis not present

## 2023-09-02 DIAGNOSIS — Z7982 Long term (current) use of aspirin: Secondary | ICD-10-CM | POA: Insufficient documentation

## 2023-09-02 DIAGNOSIS — Z950 Presence of cardiac pacemaker: Secondary | ICD-10-CM | POA: Diagnosis not present

## 2023-09-02 DIAGNOSIS — Z789 Other specified health status: Secondary | ICD-10-CM

## 2023-09-02 DIAGNOSIS — I480 Paroxysmal atrial fibrillation: Secondary | ICD-10-CM | POA: Insufficient documentation

## 2023-09-02 DIAGNOSIS — J45909 Unspecified asthma, uncomplicated: Secondary | ICD-10-CM | POA: Diagnosis not present

## 2023-09-02 DIAGNOSIS — Z431 Encounter for attention to gastrostomy: Secondary | ICD-10-CM | POA: Insufficient documentation

## 2023-09-02 DIAGNOSIS — N189 Chronic kidney disease, unspecified: Secondary | ICD-10-CM | POA: Insufficient documentation

## 2023-09-02 HISTORY — PX: IR REPLC DUODEN/JEJUNO TUBE PERCUT W/FLUORO: IMG2334

## 2023-09-02 HISTORY — PX: IR REPLC GASTRO/COLONIC TUBE PERCUT W/FLUORO: IMG2333

## 2023-09-02 MED ORDER — IOHEXOL 300 MG/ML  SOLN
50.0000 mL | Freq: Once | INTRAMUSCULAR | Status: AC | PRN
  Administered 2023-09-02: 20 mL

## 2023-09-02 MED ORDER — LIDOCAINE VISCOUS HCL 2 % MT SOLN
OROMUCOSAL | Status: AC
  Filled 2023-09-02: qty 15

## 2023-09-02 MED ORDER — LIDOCAINE HCL URETHRAL/MUCOSAL 2 % EX GEL: 1.0000 | Freq: Once | CUTANEOUS | Status: DC

## 2023-09-02 NOTE — Procedures (Signed)
 Interventional Radiology Procedure Note  Procedure: Jejunostomy tube replacement under fluoro  Complications: None  Estimated Blood Loss: None  Findings: New 18 Fr balloon retention jejunostomy tube advanced into small bowel over guidewire under fluoro. OK to use.  Marcey DASEN. [place], M.D Pager:  [phone]

## 2023-09-02 NOTE — ED Provider Notes (Signed)
Patient had J-tube replaced by IR.  Procedure went well.  Patient will be discharged home   Bethann Berkshire, MD 09/02/23 [phone]

## 2023-09-02 NOTE — ED Provider Notes (Signed)
 [place] EMERGENCY DEPARTMENT AT [facility] Provider Note  CSN: [id number] Arrival date & time: 09/02/23 1308  Chief Complaint(s) PEG Tube Problem  HPI Kathleen Jensen is a 73 y.o. female history of prior stroke, hemiparesis, hypertension, hyperlipidemia presenting with G-tube problem.  Patient has GJ tube.  Per facility, the tube was leaking at the part where the tube feeds are instilled.  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)    Hyperlipemia    Hypertension    Insomnia    Intracerebral hemorrhage, intraventricular (HCC)    Muscle weakness    Other abnormalities of gait and mobility    Seizures (HCC)    Stroke (HCC)    Unsteadiness on feet    Vitamin B deficiency    Patient Active Problem List   Diagnosis Date Noted   Ileus (HCC) 06/06/2023   Acute cystitis 03/01/2023   Delirium secondary to UTI 03/01/2023   Atrial fibrillation, chronic (HCC) 03/01/2023   History of seizure 03/01/2023   Chronic hyponatremia 03/01/2023   Acute respiratory failure (HCC) 11/16/2022   Abdominal distension 11/16/2022   AMS (altered mental status) 05/17/2022   Altered mental status 05/15/2022   Sacral wound 05/15/2022   Intracranial hemorrhage (HCC)    Pressure injury of skin 02/18/2022   Chronic indwelling Foley catheter 02/18/2022   PAF (paroxysmal atrial fibrillation) (HCC) 02/18/2022   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HCC) 02/18/2022   Septic shock (HCC) 02/15/2022   Constipation     Abdominal pain    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   History of right hemiplegia (HCC) 04/12/2020   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   Hyperlipidemia associated with type 2 diabetes mellitus (HCC) 07/30/2018   History of CVA (cerebrovascular accident) 07/30/2018   Vitamin B 12 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/30/2018   Asthma 03/13/2018   Seizures (HCC) 01/12/2018   CVA (cerebral vascular accident) (HCC) 12/08/2017   Abnormal thyroid  blood test 12/08/2017   Pulmonary embolism and infarction (HCC) 12/08/2017   Chronic anemia 12/08/2017   History of expressive aphasia 10/15/2017   Seizure disorder (HCC) 10/15/2017   Dysphagia 10/15/2017   Hyperlipidemia 09/22/2017   Urinary frequency 09/22/2017   Airway hyperreactivity 01/26/2016   Disease of thyroid  gland 01/26/2016   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   Other specified postprocedural states 01/26/2016   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   Chronic low back pain 04/10/2015   Degeneration of intervertebral disc of lumbar region 04/10/2015   Spondylolisthesis of lumbar region 04/10/2015   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   History of cardiac pacemaker in situ 01/09/2015   B12 DEFICIENCY 08/15/2008   Vitamin D  deficiency 08/15/2008   Sick sinus syndrome (HCC) 08/15/2008   OBSTRUCTIVE SLEEP APNEA 08/11/2008   INSOMNIA 04/28/2008   Hyperlipidemia LDL goal <70 12/12/2006   RHINITIS, ALLERGIC NEC 12/12/2006   Hypothyroidism 12/11/2006   Essential hypertension 12/11/2006   GERD 12/11/2006   STRESS INCONTINENCE 12/11/2006   Home Medication(s) Prior to Admission medications   Medication Sig Start  Date End Date Taking? Authorizing Provider  acetaminophen  (TYLENOL ) 160 MG/5ML solution Place 20 mLs (640 mg total) into feeding tube every 8 (eight) hours. Patient taking differently: Place 800 mg into feeding tube every 6 (six) hours. 12/03/22   Elgergawy, Brayton RAMAN, MD  amiodarone  (PACERONE ) 200 MG tablet  Place 1 tablet (200 mg total) into feeding tube daily. 12/03/22   Elgergawy, Brayton RAMAN, MD  amLODipine  (NORVASC ) 10 MG tablet Place 1 tablet (10 mg total) into feeding tube daily. Patient taking differently: Place 10 mg into feeding tube See admin instructions. 10 mg via tube once a day and hold for a Systolic reading less than 110 03/06/23   Odell Celinda Balo, MD  ascorbic acid  (VITAMIN C ) 500 MG tablet Place 0.5 tablets (250 mg total) into feeding tube 2 (two) times daily. 06/09/23   Rai, Nydia POUR, MD  aspirin  81 MG chewable tablet Place 81 mg into feeding tube at bedtime.    [provider]  chlorhexidine  (PERIDEX ) 0.12 % solution Dab a toothbrush into 15 mLs and brush teeth 2 times a day    [provider]  Cranberry 500 MG CAPS Place 500 mg into feeding tube 2 (two) times daily. Patient not taking: Reported on 06/29/2023    [provider]  cyclobenzaprine  (FLEXERIL ) 5 MG tablet Place 2.5 mg into feeding tube in the morning and at bedtime. Hold If Sedated    [provider]  diazePAM , 20 MG Dose, (VALTOCO  20 MG DOSE) 2 x 10 MG/0.1ML LQPK Place 20 mg into the nose daily as needed (seizures). For up to 3 days.    [provider]  estradiol  (ESTRACE ) 0.1 MG/GM vaginal cream Place vaginally See admin instructions. Apply a pea-sized amount to the urethra/vaginal wall on Tuesdays and Fridays    [provider]  famotidine  (PEPCID ) 40 MG/5ML suspension Place 40 mg into feeding tube daily. 03/25/22   [provider]  folic acid  (FOLVITE ) 800 MCG tablet Place 800 mcg into feeding tube daily.    [provider]  HYDROcodone -acetaminophen  (NORCO/VICODIN) 5-325 MG tablet Place 1 tablet into feeding tube 2 (two) times daily as needed for moderate pain (pain score 4-6).    [provider]  levETIRAcetam  (KEPPRA ) 100 MG/ML solution Place 1,000 mg into feeding tube 2 (two) times daily.    [provider]  losartan  (COZAAR ) 25  MG tablet Place 1 tablet (25 mg total) into feeding tube daily. 03/06/23   Odell Celinda Balo, MD  Menthol , Topical Analgesic, (BIOFREEZE) 4 % GEL Apply 2 g topically See admin instructions. Apply 2 grams topically to all limbs 2 times a day    [provider]  metoprolol  tartrate (LOPRESSOR ) 25 MG tablet Place 1 tablet (25 mg total) into feeding tube 2 (two) times daily. 12/03/22   Elgergawy, Brayton RAMAN, MD  nutrition supplement, JUVEN, (JUVEN) PACK Place 1 packet into feeding tube 2 (two) times daily. Patient not taking: Reported on 06/29/2023 06/09/23   Rai, Nydia POUR, MD  Nutritional Supplements (FEEDING SUPPLEMENT, OSMOLITE 1.5 CAL,) LIQD Place 1,000 mLs into feeding tube continuous. Osmolite 1.5 @ 65 ml/hr x 18 hours, holding 1 hour before and 2 hours after each dilantin  administration Patient not taking: Reported on 06/29/2023 06/09/23   Rai, Nydia POUR, MD  ondansetron  (ZOFRAN ) 4 MG tablet Place 1 tablet (4 mg total) into feeding tube every 6 (six) hours as needed for nausea or vomiting. Patient not taking: Reported on 06/29/2023 06/09/23   Rai, Ripudeep K,  MD  phenytoin  (DILANTIN ) 125 MG/5ML suspension Place 1 mL (25 mg total) into feeding tube 2 (two) times daily for 2 days. Stop date on 06/11/2023 Patient not taking: Reported on 06/29/2023 06/09/23 06/11/23  Rai, Nydia POUR, MD  polyethylene glycol (MIRALAX  / GLYCOLAX ) 17 g packet Place 34 g into feeding tube daily. 06/09/23   Rai, Ripudeep K, MD  senna-docusate (SENOKOT-S) 8.6-50 MG tablet Place 2 tablets into feeding tube 2 (two) times daily. 02/22/22   Cheryle Page, MD  simethicone  (MYLICON) 80 MG chewable tablet Place 80 mg into feeding tube 3 (three) times daily.    [provider]  simvastatin  (ZOCOR ) 10 MG tablet Place 1 tablet (10 mg total) into feeding tube at bedtime. 02/22/22   Cheryle Page, MD  torsemide  (DEMADEX ) 10 MG tablet Give 10 mg per tube in the morning and hold for a systolic reading less than 110     [provider]  valproic  acid (DEPAKENE ) 250 MG/5ML solution Place 500-750 mg into feeding tube See admin instructions. 500 mg via tube three times a day and 750 mg at bedtime    [provider]                                                                                                                                    Past Surgical History Past Surgical History:  Procedure Laterality Date   BACK SURGERY     Dec 18, 2016 Dr.Torrealba   BREAST BIOPSY Right    BREAST EXCISIONAL BIOPSY     CERVICAL SPINE SURGERY     C4   CESAREAN SECTION     GASTRIC BYPASS  03/2007   IR REPLC DUODEN/JEJUNO TUBE PERCUT W/FLUORO  01/27/2023   IR REPLC DUODEN/JEJUNO TUBE PERCUT W/FLUORO  03/07/2023   IR REPLC DUODEN/JEJUNO TUBE PERCUT W/FLUORO  04/30/2023   TONSILLECTOMY     Family History Family History  Problem Relation Age of Onset   High blood pressure Mother    Diabetes Mother    Diabetes Father    Stroke Father    Cancer Sister        Colorectal cancer   Cancer Brother        colon    Social History Social History   Tobacco Use   Smoking status: Former    Current packs/day: 0.00    Types: Cigarettes    Start date: 07/30/1975    Quit date: 07/30/1979    Years since quitting: 44.1   Smokeless tobacco: Never  Vaping Use   Vaping status: Never Used  Substance Use Topics   Alcohol  use: No    Alcohol /week: 0.0 standard drinks of alcohol    Drug use: No   Allergies Tape, Demerol [meperidine hcl], Sulfonamide derivatives, Bacitracin-polymyxin b, and Oxycontin [oxycodone]  Review of Systems Review of Systems  Unable to perform ROS: Other    Physical Exam Vital Signs  I have reviewed the  triage vital signs BP 124/77 (BP Location: Left Arm)   Pulse 66   Temp 98.5 F (36.9 C) (Oral)   Resp 16   Ht 5' 10 (1.778 m)   Wt 97.3 kg   SpO2 100%   BMI 30.78 kg/m  Physical Exam Vitals and nursing note reviewed.  Constitutional:      Appearance: Normal appearance.   HENT:     Head: Normocephalic and atraumatic.     Mouth/Throat:     Mouth: Mucous membranes are moist.  Eyes:     Conjunctiva/sclera: Conjunctivae normal.  Cardiovascular:     Rate and Rhythm: Normal rate.  Pulmonary:     Effort: Pulmonary effort is normal. No respiratory distress.  Abdominal:     General: Abdomen is flat. There is no distension.     Tenderness: There is no abdominal tenderness.     Comments: Left upper quadrant G-tube site clean, dry, intact.  GJ tube leaking gastric contents from tip  Musculoskeletal:        General: No deformity.  Skin:    General: Skin is warm and dry.     Capillary Refill: Capillary refill takes less than 2 seconds.  Neurological:     General: No focal deficit present.     Mental Status: She is alert. Mental status is at baseline.     Comments: Follows commands, aphasic speech  Psychiatric:        Mood and Affect: Mood normal.        Behavior: Behavior normal.     ED Results and Treatments Labs (all labs ordered are listed, but only abnormal results are displayed) Labs Reviewed - No data to display                                                                                                                        Radiology No results found.  Pertinent labs & imaging results that were available during my care of the patient were reviewed by me and considered in my medical decision making (see MDM for details).  Medications Ordered in ED Medications - No data to display                                                                                                                                   Procedures .Gastrostomy tube replacement  Date/Time: 09/02/2023 3:10 PM  Performed by: Francesca Elsie CROME, MD Authorized  by: Francesca Elsie CROME, MD  Consent: Verbal consent obtained. Local anesthesia used: no  Anesthesia: Local anesthesia used: no  Sedation: Patient sedated: no  Patient tolerance: patient tolerated the  procedure well with no immediate complications     (including critical care time)  Medical Decision Making / ED Course   MDM:  73 year old presenting with leaking GJ tube.  Replaced GJ tube with G-tube temporarily due to leaking.  Discussed with IR and they will be able to replace her GJ tube.  Signed out to oncoming physician pending J-tube placement by IR.      Medicines ordered and prescription drug management: No orders of the defined types were placed in this encounter.   -I have reviewed the patients home medicines and have made adjustments as needed  Social Determinants of Health:  Diagnosis or treatment significantly limited by social determinants of health: obesity   Reevaluation: After the interventions noted above, I reevaluated the patient and found that their symptoms have improved  Co morbidities that complicate the patient evaluation  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    Final Clinical Impression(s) / ED Diagnoses Final diagnoses:  Encounter for gastrojejunal (GJ) tube placement     This chart was dictated using voice recognition software.  Despite best efforts to  proofread,  errors can occur which can change the documentation meaning.    Francesca Elsie CROME, MD 09/02/23 [phone]

## 2023-09-02 NOTE — Discharge Instructions (Signed)
 Follow up as needed

## 2023-09-02 NOTE — ED Notes (Signed)
[facility] here for transport back to [facility]

## 2023-09-02 NOTE — ED Triage Notes (Signed)
Patient BIB EMS for peg tube leaking. Patient from [facility].

## 2023-09-03 ENCOUNTER — Encounter (HOSPITAL_COMMUNITY): Payer: Self-pay | Admitting: Radiology

## 2023-09-22 ENCOUNTER — Other Ambulatory Visit (HOSPITAL_COMMUNITY): Payer: Self-pay | Admitting: Internal Medicine

## 2023-09-22 ENCOUNTER — Telehealth (HOSPITAL_COMMUNITY): Payer: Self-pay

## 2023-09-22 DIAGNOSIS — R1311 Dysphagia, oral phase: Secondary | ICD-10-CM

## 2023-09-22 NOTE — Telephone Encounter (Signed)
 Returned called to [place] at [facility], no answer, left vm. AB

## 2023-09-24 ENCOUNTER — Ambulatory Visit (HOSPITAL_COMMUNITY)
Admission: RE | Admit: 2023-09-24 | Discharge: 2023-09-24 | Disposition: A | Discharge status: Home / Self Care | Payer: Medicare HMO | Source: Ambulatory Visit | Attending: Internal Medicine | Admitting: Internal Medicine

## 2023-09-24 ENCOUNTER — Other Ambulatory Visit (HOSPITAL_COMMUNITY): Payer: Self-pay | Admitting: Internal Medicine

## 2023-09-24 DIAGNOSIS — R1311 Dysphagia, oral phase: Secondary | ICD-10-CM

## 2023-09-24 DIAGNOSIS — Z434 Encounter for attention to other artificial openings of digestive tract: Secondary | ICD-10-CM | POA: Diagnosis present

## 2023-09-24 HISTORY — PX: IR REPLC DUODEN/JEJUNO TUBE PERCUT W/FLUORO: IMG2334

## 2023-09-24 MED ORDER — IOHEXOL 300 MG/ML  SOLN
50.0000 mL | Freq: Once | INTRAMUSCULAR | Status: AC | PRN
  Administered 2023-09-24: 25 mL

## 2023-09-24 MED ORDER — LIDOCAINE VISCOUS HCL 2 % MT SOLN
OROMUCOSAL | Status: AC
  Filled 2023-09-24: qty 15

## 2023-09-24 MED ORDER — LIDOCAINE VISCOUS HCL 2 % MT SOLN
15.0000 mL | Freq: Four times a day (QID) | OROMUCOSAL | Status: DC | PRN
  Administered 2023-09-24: 10 mL via OROMUCOSAL

## 2023-09-24 NOTE — Procedures (Signed)
 Interventional Radiology Procedure Note   Hx: Malfunctioning direct j tube. Last changed 2/4  Procedure:  Exchange of kinked j tube.  New 93F balloon j tube.  We cut at length of 25cm from the balloon.  Findings: The course of bowel is causing kinking.  The direction of peristalsis is difficult to determine under fluoro.  Patient has significant colon distension, causing distortion of bowel loops contributing The prior tube was kinked in 3 locations from the bowel distortion.  .  Complications: None  Recommendations:  - Ok to use - needs copious flush after use - if kinks again, consider a very short jtube length, perhaps 10cm internal length or less    Signed,  Yvone Neu. Loreta Ave, DO

## 2023-11-15 ENCOUNTER — Other Ambulatory Visit: Payer: Self-pay

## 2023-11-15 ENCOUNTER — Other Ambulatory Visit (HOSPITAL_COMMUNITY)

## 2023-11-15 ENCOUNTER — Emergency Department (HOSPITAL_COMMUNITY)

## 2023-11-15 ENCOUNTER — Emergency Department (HOSPITAL_COMMUNITY)
Admission: EM | Admit: 2023-11-15 | Discharge: 2023-11-17 | Disposition: A | Discharge status: Home / Self Care | Attending: Emergency Medicine | Admitting: Emergency Medicine

## 2023-11-15 ENCOUNTER — Encounter (HOSPITAL_COMMUNITY): Payer: Self-pay

## 2023-11-15 ENCOUNTER — Other Ambulatory Visit (HOSPITAL_COMMUNITY): Payer: Self-pay

## 2023-11-15 DIAGNOSIS — E1122 Type 2 diabetes mellitus with diabetic chronic kidney disease: Secondary | ICD-10-CM | POA: Insufficient documentation

## 2023-11-15 DIAGNOSIS — N189 Chronic kidney disease, unspecified: Secondary | ICD-10-CM | POA: Insufficient documentation

## 2023-11-15 DIAGNOSIS — R140 Abdominal distension (gaseous): Secondary | ICD-10-CM | POA: Diagnosis not present

## 2023-11-15 DIAGNOSIS — Z8673 Personal history of transient ischemic attack (TIA), and cerebral infarction without residual deficits: Secondary | ICD-10-CM | POA: Diagnosis not present

## 2023-11-15 DIAGNOSIS — Z434 Encounter for attention to other artificial openings of digestive tract: Secondary | ICD-10-CM | POA: Diagnosis present

## 2023-11-15 DIAGNOSIS — Z7982 Long term (current) use of aspirin: Secondary | ICD-10-CM | POA: Diagnosis not present

## 2023-11-15 DIAGNOSIS — Z789 Other specified health status: Secondary | ICD-10-CM

## 2023-11-15 LAB — CBC WITH DIFFERENTIAL/PLATELET
Abs Immature Granulocytes: 0.04 10*3/uL (ref 0.00–0.07)
Basophils Absolute: 0 10*3/uL (ref 0.0–0.1)
Basophils Relative: 1 %
Eosinophils Absolute: 0.1 10*3/uL (ref 0.0–0.5)
Eosinophils Relative: 2 %
HCT: 42.9 % (ref 36.0–46.0)
Hemoglobin: 15.1 g/dL — ABNORMAL HIGH (ref 12.0–15.0)
Immature Granulocytes: 1 %
Lymphocytes Relative: 53 %
Lymphs Abs: 3.3 10*3/uL (ref 0.7–4.0)
MCH: 36.7 pg — ABNORMAL HIGH (ref 26.0–34.0)
MCHC: 35.2 g/dL (ref 30.0–36.0)
MCV: 104.4 fL — ABNORMAL HIGH (ref 80.0–100.0)
Monocytes Absolute: 0.7 10*3/uL (ref 0.1–1.0)
Monocytes Relative: 11 %
Neutro Abs: 1.9 10*3/uL (ref 1.7–7.7)
Neutrophils Relative %: 32 %
Platelets: UNDETERMINED 10*3/uL (ref 150–400)
RBC: 4.11 MIL/uL (ref 3.87–5.11)
RDW: 13.3 % (ref 11.5–15.5)
WBC: 6.1 10*3/uL (ref 4.0–10.5)
nRBC: 0 % (ref 0.0–0.2)

## 2023-11-15 LAB — COMPREHENSIVE METABOLIC PANEL WITH GFR
ALT: 29 U/L (ref 0–44)
AST: 67 U/L — ABNORMAL HIGH (ref 15–41)
Albumin: 2.1 g/dL — ABNORMAL LOW (ref 3.5–5.0)
Alkaline Phosphatase: 173 U/L — ABNORMAL HIGH (ref 38–126)
Anion gap: 9 (ref 5–15)
BUN: 30 mg/dL — ABNORMAL HIGH (ref 8–23)
CO2: 29 mmol/L (ref 22–32)
Calcium: 8.4 mg/dL — ABNORMAL LOW (ref 8.9–10.3)
Chloride: 100 mmol/L (ref 98–111)
Creatinine, Ser: 0.63 mg/dL (ref 0.44–1.00)
GFR, Estimated: >60 mL/min (ref >60)
Glucose, Bld: 67 mg/dL — ABNORMAL LOW (ref 70–99)
Potassium: 4.4 mmol/L (ref 3.5–5.1)
Sodium: 138 mmol/L (ref 135–145)
Total Bilirubin: 0.6 mg/dL (ref 0.0–1.2)
Total Protein: 8.2 g/dL — ABNORMAL HIGH (ref 6.5–8.1)

## 2023-11-15 LAB — CBG MONITORING, ED
Glucose-Capillary: 50 mg/dL — ABNORMAL LOW (ref 70–99)
Glucose-Capillary: 86 mg/dL (ref 70–99)

## 2023-11-15 LAB — LIPASE, BLOOD: Lipase: 33 U/L (ref 11–51)

## 2023-11-15 MED ORDER — DEXTROSE 10 % IV SOLN: INTRAVENOUS | Status: DC

## 2023-11-15 MED ORDER — DEXTROSE 50 % IV SOLN
50.0000 mL | Freq: Once | INTRAVENOUS | Status: AC
  Administered 2023-11-15: 50 mL via INTRAVENOUS
  Filled 2023-11-15: qty 50

## 2023-11-15 MED ORDER — AMIODARONE HCL 200 MG PO TABS: 200.0000 mg | ORAL_TABLET | Freq: Every day | ORAL | Status: DC

## 2023-11-15 MED ORDER — LEVETIRACETAM 100 MG/ML PO SOLN: 1000.0000 mg | Freq: Two times a day (BID) | ORAL | Status: DC

## 2023-11-15 NOTE — ED Provider Notes (Signed)
 [place] EMERGENCY DEPARTMENT AT [facility] Provider Note   CSN: [id number] Arrival date & time: 11/15/23  1840     History   Problem with GJ tube.   Kathleen Jensen is a 73 y.o. female.  History limited by aphasia.  Supposedly per staff GJ tube is not flushing well.  Patient denies any abdominal pain nausea or vomiting.  Unremarkable vitals with EMS.  She does not appear to be in any major discomfort.  History of stroke diabetes CKD.  Comes from nursing home.  The history is provided by the patient.       Home Medications Prior to Admission medications   Medication Sig Start Date End Date Taking? Authorizing Provider  acetaminophen  (TYLENOL ) 160 MG/5ML solution Place 20 mLs (640 mg total) into feeding tube every 8 (eight) hours. Patient taking differently: Place 800 mg into feeding tube every 6 (six) hours. 12/03/22  Yes Elgergawy, Ardia Kraft, MD  acetic acid 0.25 % irrigation 20cc irrigation twice a day, flush foley to prevent clogging 11/06/23  Yes [provider]  amiodarone  (PACERONE ) 200 MG tablet Place 1 tablet (200 mg total) into feeding tube daily. 12/03/22  Yes Elgergawy, Ardia Kraft, MD  amLODipine  (NORVASC ) 10 MG tablet Place 1 tablet (10 mg total) into feeding tube daily. Patient taking differently: Place 5 mg into feeding tube See admin instructions. 5 mg via tube once a day and hold for a Systolic reading less than 110 03/06/23  Yes Macdonald Savoy, MD  ascorbic acid  (VITAMIN C ) 500 MG tablet Place 0.5 tablets (250 mg total) into feeding tube 2 (two) times daily. 06/09/23  Yes Rai, Ripudeep K, MD  aspirin  81 MG chewable tablet Place 81 mg into feeding tube at bedtime.   Yes [provider]  chlorhexidine  (PERIDEX ) 0.12 % solution Dab a toothbrush into 15 mLs and brush teeth 2 times a day   Yes [provider]  cyclobenzaprine  (FLEXERIL ) 5 MG tablet Place 2.5 mg into feeding tube in the morning and at bedtime. Hold If Sedated    Yes [provider]  diazePAM , 20 MG Dose, (VALTOCO  20 MG DOSE) 2 x 10 MG/0.1ML LQPK Place 20 mg into the nose daily as needed (seizures).   Yes [provider]  estradiol (ESTRACE) 0.1 MG/GM vaginal cream Place vaginally See admin instructions. Apply a pea-sized amount to the urethra/vaginal wall on Tuesdays and Fridays   Yes [provider]  famotidine  (PEPCID ) 40 MG/5ML suspension Place 40 mg into feeding tube daily. 03/25/22  Yes [provider]  folic acid  (FOLVITE ) 800 MCG tablet Place 800 mcg into feeding tube daily.   Yes [provider]  HYDROcodone -acetaminophen  (NORCO/VICODIN) 5-325 MG tablet Place 1 tablet into feeding tube 2 (two) times daily as needed for moderate pain (pain score 4-6).   Yes [provider]  lactulose (CHRONULAC) 10 GM/15ML solution Take 20 g by mouth daily. 11/07/23  Yes [provider]  levETIRAcetam  (KEPPRA ) 100 MG/ML solution Place 1,000 mg into feeding tube 2 (two) times daily.   Yes [provider]  losartan  (COZAAR ) 25 MG tablet Place 1 tablet (25 mg total) into feeding tube daily. Patient taking differently: Place 25 mg into feeding tube daily. Hold for SBP <110 03/06/23  Yes Macdonald Savoy, MD  Menthol , Topical Analgesic, (BIOFREEZE) 4 % GEL Apply 2 g topically See admin instructions. Apply 2 grams topically to all limbs 2 times a day   Yes [provider]  metoprolol   tartrate (LOPRESSOR ) 25 MG tablet Place 1 tablet (25 mg total) into feeding tube 2 (two) times daily. 12/03/22  Yes Elgergawy, Ardia Kraft, MD  midodrine (PROAMATINE) 10 MG tablet Take 10 mg by mouth 3 (three) times daily as needed (if SBP<100, DBP<70). 10/07/23  Yes [provider]  polyethylene glycol (MIRALAX  / GLYCOLAX ) 17 g packet Place 34 g into feeding tube daily. 06/09/23  Yes Rai, Ripudeep K, MD  senna-docusate (SENOKOT-S) 8.6-50 MG tablet Place 2 tablets into feeding tube 2 (two) times daily. 02/22/22   Yes Audria Leather, MD  simethicone  (MYLICON) 80 MG chewable tablet Place 80 mg into feeding tube 3 (three) times daily.   Yes [provider]  simvastatin  (ZOCOR ) 10 MG tablet Place 1 tablet (10 mg total) into feeding tube at bedtime. 02/22/22  Yes Audria Leather, MD  torsemide  (DEMADEX ) 10 MG tablet Give 10 mg per tube in the morning and hold for a systolic reading less than 110   Yes [provider]  valproic  acid (DEPAKENE ) 250 MG/5ML solution Place 500-750 mg into feeding tube See admin instructions. 500 mg via tube three times a day and 750 mg at bedtime   Yes [provider]  nutrition supplement, JUVEN, (JUVEN) PACK Place 1 packet into feeding tube 2 (two) times daily. Patient not taking: Reported on 06/29/2023 06/09/23   Rai, Hurman Maiden, MD  Nutritional Supplements (FEEDING SUPPLEMENT, OSMOLITE 1.5 CAL,) LIQD Place 1,000 mLs into feeding tube continuous. Osmolite 1.5 @ 65 ml/hr x 18 hours, holding 1 hour before and 2 hours after each dilantin  administration Patient not taking: Reported on 06/29/2023 06/09/23   Loma Rising, MD      Allergies    Tape, Demerol [meperidine hcl], Sulfonamide derivatives, Bacitracin-polymyxin b, and Oxycontin  [oxycodone ]    Review of Systems   Review of Systems  Physical Exam Updated Vital Signs BP 104/67 (BP Location: Right Arm)   Pulse 66   SpO2 98%  Physical Exam Vitals and nursing note reviewed.  Constitutional:      General: She is not in acute distress.    Appearance: She is well-developed. She is not ill-appearing.  HENT:     Head: Normocephalic and atraumatic.     Mouth/Throat:     Mouth: Mucous membranes are moist.  Eyes:     Conjunctiva/sclera: Conjunctivae normal.     Pupils: Pupils are equal, round, and reactive to light.  Cardiovascular:     Rate and Rhythm: Normal rate and regular rhythm.     Heart sounds: No murmur heard. Pulmonary:     Effort: Pulmonary effort is normal. No respiratory distress.      Breath sounds: Normal breath sounds.  Abdominal:     General: There is distension.     Palpations: Abdomen is soft.     Tenderness: There is no abdominal tenderness.     Comments: GJ tube in place but there does not appear to be any leaking around the tube  Musculoskeletal:        General: No swelling.     Cervical back: Normal range of motion and neck supple.  Skin:    General: Skin is warm and dry.     Capillary Refill: Capillary refill takes less than 2 seconds.  Neurological:     Mental Status: She is alert.  Psychiatric:        Mood and Affect: Mood normal.     ED Results / Procedures / Treatments   Labs (all labs ordered are  listed, but only abnormal results are displayed) Labs Reviewed  CBC WITH DIFFERENTIAL/PLATELET - Abnormal; Notable for the following components:      Result Value   Hemoglobin 15.1 (*)    MCV 104.4 (*)    MCH 36.7 (*)    All other components within normal limits  COMPREHENSIVE METABOLIC PANEL WITH GFR - Abnormal; Notable for the following components:   Glucose, Bld 67 (*)    BUN 30 (*)    Calcium  8.4 (*)    Total Protein 8.2 (*)    Albumin  2.1 (*)    AST 67 (*)    Alkaline Phosphatase 173 (*)    All other components within normal limits  CBG MONITORING, ED - Abnormal; Notable for the following components:   Glucose-Capillary 50 (*)    All other components within normal limits  LIPASE, BLOOD    EKG None  Radiology No results found.  Procedures Procedures    Medications Ordered in ED Medications  amiodarone  (PACERONE ) tablet 200 mg (has no administration in time range)  levETIRAcetam  (KEPPRA ) 100 MG/ML solution 1,000 mg (has no administration in time range)  dextrose  10 % infusion (has no administration in time range)  dextrose  50 % solution 50 mL (has no administration in time range)    ED Course/ Medical Decision Making/ A&P                                 Medical Decision Making Amount and/or Complexity of Data  Reviewed Labs: ordered. Radiology: ordered.  Risk Prescription drug management.   Kathleen Jensen is here with GJ tube issue.  Normal vitals.  No fever.  She is asymptomatic.  Had little bit of distention in her belly but no tenderness.  She does not complain of any abdominal pain and shakes her head no.  It looks like when we went to go evaluate the GJ tube and try to put some contrast to it there is a crack and split in the external tubing of the GJ tube.  This will need to be replaced.  I have messaged IR team overall she will likely have to board here overnight and get the GJ tube placed in the morning.  I reached out to Dr. Marne Sings.  Will check basic labs at this time but anticipate that this will likely get them in the morning then she will be discharged back to her facility.  IR team to do exchange in the morning.  Blood sugar 67 on the CMP and then on repeat 50.  She is not able to get tube feeds at this time so we will start IV and put her on some dextrose  maintenance fluids until she can get IR GJ tube.  Lab work otherwise unremarkable.  Anticipate discharge after she gets feeding tube changed.  This chart was dictated using voice recognition software.  Despite best efforts to proofread,  errors can occur which can change the documentation meaning.         Final Clinical Impression(s) / ED Diagnoses Final diagnoses:  Encounter for gastrojejunal (GJ) tube placement    Rx / DC Orders ED Discharge Orders     None         Lowery Rue, DO 11/15/23 2148

## 2023-11-15 NOTE — ED Notes (Signed)
 IV team at bedside

## 2023-11-15 NOTE — ED Triage Notes (Signed)
 Pt arrives via [facility] from [facility] with c/o g-tube "bursting." [facility] stated staff told the the g-tube was not flushing correctly. G-Tube appears to be inserted in correctly, no visible damage seen.   Pt has hx of stroke, is aphasic with right sided paralysis. Unable to provide any medical information. She did shake her head no when asked if she was in any pain.  BP- 97/67, HR-64, 100% RA

## 2023-11-16 ENCOUNTER — Emergency Department (HOSPITAL_COMMUNITY)

## 2023-11-16 DIAGNOSIS — Z434 Encounter for attention to other artificial openings of digestive tract: Secondary | ICD-10-CM | POA: Diagnosis not present

## 2023-11-16 HISTORY — PX: IR GJ TUBE CHANGE: IMG1440

## 2023-11-16 LAB — CBG MONITORING, ED
Glucose-Capillary: 77 mg/dL (ref 70–99)
Glucose-Capillary: 91 mg/dL (ref 70–99)
Glucose-Capillary: 98 mg/dL (ref 70–99)
Glucose-Capillary: 71 mg/dL (ref 70–99)
Glucose-Capillary: 110 mg/dL — ABNORMAL HIGH (ref 70–99)
Glucose-Capillary: 99 mg/dL (ref 70–99)
Glucose-Capillary: 101 mg/dL — ABNORMAL HIGH (ref 70–99)

## 2023-11-16 MED ORDER — VALPROATE SODIUM 100 MG/ML IV SOLN
500.0000 mg | Freq: Three times a day (TID) | INTRAVENOUS | Status: DC
  Administered 2023-11-16 (×3): 500 mg via INTRAVENOUS
  Filled 2023-11-16 (×6): qty 5

## 2023-11-16 MED ORDER — IOHEXOL 300 MG/ML  SOLN
50.0000 mL | Freq: Once | INTRAMUSCULAR | Status: AC | PRN
  Administered 2023-11-16: 10 mL

## 2023-11-16 MED ORDER — LIDOCAINE VISCOUS HCL 2 % MT SOLN
OROMUCOSAL | Status: AC
  Filled 2023-11-16: qty 15

## 2023-11-16 MED ORDER — LEVETIRACETAM IN NACL 1000 MG/100ML IV SOLN
1000.0000 mg | Freq: Two times a day (BID) | INTRAVENOUS | Status: DC
  Administered 2023-11-16 (×3): 1000 mg via INTRAVENOUS
  Filled 2023-11-16 (×3): qty 100

## 2023-11-16 MED ORDER — VALPROATE SODIUM 100 MG/ML IV SOLN
750.0000 mg | Freq: Every day | INTRAVENOUS | Status: DC
  Administered 2023-11-16 (×2): 750 mg via INTRAVENOUS
  Filled 2023-11-16 (×2): qty 7.5

## 2023-11-16 NOTE — ED Notes (Signed)
 Repositioned pt in bed/ turned pt on right side as far as pt could tolerate/ brief  clean and dry

## 2023-11-16 NOTE — ED Notes (Signed)
 Hospital bed arrived in department for pt at this time.

## 2023-11-16 NOTE — ED Notes (Signed)
 Assumed care of pt, pt came in today to G-tube problem. Pt has consult for IR to replace the tube tomorrow. POC is to seal thee crack on the tube so it don't leak on pt. PT has right sided deficits for older stroke. Pt also has trouble with words which is her baseline.

## 2023-11-16 NOTE — ED Notes (Signed)
 Patient transported to IR

## 2023-11-16 NOTE — Consult Note (Signed)
 Referring Provider(s): Lowery Rue   Supervising Physician: Fernando Hoyer  Patient Status:  [facility] - In-pt  Chief Complaint:  Cracked jejunal tube - image guided jejunal tube replacement   Subjective:  Pt reported to the emergency department 11/15/23 with a cracked jejunal tube just below the lumens. Pt is severely aphasic and I was unable to understand her words.    Allergies: Tape, Demerol [meperidine hcl], Sulfonamide derivatives, Bacitracin-polymyxin b, and Oxycontin  [oxycodone ]  Medications: Prior to Admission medications   Medication Sig Start Date End Date Taking? Authorizing Provider  acetaminophen  (TYLENOL ) 160 MG/5ML solution Place 20 mLs (640 mg total) into feeding tube every 8 (eight) hours. Patient taking differently: Place 800 mg into feeding tube every 6 (six) hours. 12/03/22  Yes Elgergawy, Ardia Kraft, MD  acetic acid 0.25 % irrigation 20cc irrigation twice a day, flush foley to prevent clogging 11/06/23  Yes [provider]  amiodarone  (PACERONE ) 200 MG tablet Place 1 tablet (200 mg total) into feeding tube daily. 12/03/22  Yes Elgergawy, Ardia Kraft, MD  amLODipine  (NORVASC ) 10 MG tablet Place 1 tablet (10 mg total) into feeding tube daily. Patient taking differently: Place 5 mg into feeding tube See admin instructions. 5 mg via tube once a day and hold for a Systolic reading less than 110 03/06/23  Yes Macdonald Savoy, MD  ascorbic acid  (VITAMIN C ) 500 MG tablet Place 0.5 tablets (250 mg total) into feeding tube 2 (two) times daily. 06/09/23  Yes Rai, Ripudeep K, MD  aspirin  81 MG chewable tablet Place 81 mg into feeding tube at bedtime.   Yes [provider]  chlorhexidine  (PERIDEX ) 0.12 % solution Dab a toothbrush into 15 mLs and brush teeth 2 times a day   Yes [provider]  cyclobenzaprine  (FLEXERIL ) 5 MG tablet Place 2.5 mg into feeding tube in the morning and at bedtime. Hold If Sedated   Yes [provider]  diazePAM , 20  MG Dose, (VALTOCO  20 MG DOSE) 2 x 10 MG/0.1ML LQPK Place 20 mg into the nose daily as needed (seizures).   Yes [provider]  estradiol (ESTRACE) 0.1 MG/GM vaginal cream Place vaginally See admin instructions. Apply a pea-sized amount to the urethra/vaginal wall on Tuesdays and Fridays   Yes [provider]  famotidine  (PEPCID ) 40 MG/5ML suspension Place 40 mg into feeding tube daily. 03/25/22  Yes [provider]  folic acid  (FOLVITE ) 800 MCG tablet Place 800 mcg into feeding tube daily.   Yes [provider]  HYDROcodone -acetaminophen  (NORCO/VICODIN) 5-325 MG tablet Place 1 tablet into feeding tube 2 (two) times daily as needed for moderate pain (pain score 4-6).   Yes [provider]  lactulose (CHRONULAC) 10 GM/15ML solution Take 20 g by mouth daily. 11/07/23  Yes [provider]  levETIRAcetam  (KEPPRA ) 100 MG/ML solution Place 1,000 mg into feeding tube 2 (two) times daily.   Yes [provider]  losartan  (COZAAR ) 25 MG tablet Place 1 tablet (25 mg total) into feeding tube daily. Patient taking differently: Place 25 mg into feeding tube daily. Hold for SBP <110 03/06/23  Yes Macdonald Savoy, MD  Menthol , Topical Analgesic, (BIOFREEZE) 4 % GEL Apply 2 g topically See admin instructions. Apply 2 grams topically to all limbs 2 times a day   Yes [provider]  metoprolol  tartrate (LOPRESSOR ) 25 MG tablet Place 1 tablet (25 mg total) into feeding tube 2 (two) times daily. 12/03/22  Yes Elgergawy, Ardia Kraft, MD  midodrine (PROAMATINE)  10 MG tablet Take 10 mg by mouth 3 (three) times daily as needed (if SBP<100, DBP<70). 10/07/23  Yes [provider]  polyethylene glycol (MIRALAX  / GLYCOLAX ) 17 g packet Place 34 g into feeding tube daily. 06/09/23  Yes Rai, Ripudeep K, MD  senna-docusate (SENOKOT-S) 8.6-50 MG tablet Place 2 tablets into feeding tube 2 (two) times daily. 02/22/22  Yes Audria Leather, MD  simethicone  (MYLICON)  80 MG chewable tablet Place 80 mg into feeding tube 3 (three) times daily.   Yes [provider]  simvastatin  (ZOCOR ) 10 MG tablet Place 1 tablet (10 mg total) into feeding tube at bedtime. 02/22/22  Yes Audria Leather, MD  torsemide  (DEMADEX ) 10 MG tablet Give 10 mg per tube in the morning and hold for a systolic reading less than 110   Yes [provider]  valproic  acid (DEPAKENE ) 250 MG/5ML solution Place 500-750 mg into feeding tube See admin instructions. 500 mg via tube three times a day and 750 mg at bedtime   Yes [provider]  nutrition supplement, JUVEN, (JUVEN) PACK Place 1 packet into feeding tube 2 (two) times daily. Patient not taking: Reported on 06/29/2023 06/09/23   Rai, Hurman Maiden, MD  Nutritional Supplements (FEEDING SUPPLEMENT, OSMOLITE 1.5 CAL,) LIQD Place 1,000 mLs into feeding tube continuous. Osmolite 1.5 @ 65 ml/hr x 18 hours, holding 1 hour before and 2 hours after each dilantin  administration Patient not taking: Reported on 06/29/2023 06/09/23   Rai, Hurman Maiden, MD     Vital Signs: BP 114/67   Pulse 66   Temp 98.2 F (36.8 C)   Resp 19   SpO2 100%   Physical Exam Constitutional:      Appearance: She is well-developed.  Skin:    Comments: Skin at jejunal tube site is clean, dry, without infection.  Jejunal tube is intact at the site.  Tube is taped just below the lumens, no obvious leaks at time of assessment.    Neurological:     Mental Status: She is disoriented.      Labs:  CBC: Recent Labs    06/06/23 1320 06/29/23 0553 07/14/23 1541 11/15/23 2035  WBC 6.9 7.8 6.9 6.1  HGB 14.6 13.0 13.1 15.1*  HCT 44.8 39.4 39.1 42.9  PLT 131* 123* 123* PLATELET CLUMPS NOTED ON SMEAR, UNABLE TO ESTIMATE    COAGS: No results for input(s): "INR", "APTT" in the last 8760 hours.  BMP: Recent Labs    06/09/23 0606 06/29/23 0553 07/14/23 1541 11/15/23 2035  NA 135 135 137 138  K 3.2* 3.9 3.5 4.4  CL 105 95* 97* 100  CO2 24 30  28 29   GLUCOSE 118* 67* 86 67*  BUN 17 31* 40* 30*  CALCIUM  7.9* 8.2* 8.5* 8.4*  CREATININE 0.53 0.70 0.68 0.63  GFRNONAA >60 >60 >60 >60    LIVER FUNCTION TESTS: Recent Labs    06/06/23 1320 06/29/23 0553 07/14/23 1541 11/15/23 2035  BILITOT 0.5 0.6 0.5 0.6  AST 70* 72* 74* 67*  ALT 32 27 34 29  ALKPHOS 197* 134* 127* 173*  PROT 9.2* 8.3* 8.1 8.2*  ALBUMIN  2.6* 2.3* 2.4* 2.1*    Assessment and Plan:  Pt was seen today 11/16/23 in the ED due to a cracked jejunal tube.  I personally assessed the jejunal tube, which was taped just below the lumens on arrival. There was no active leaking.  No replacement measures are able to be taken at bedside.  Pt is undergoing image guided  jejunal tube replacement today 11/16/23.    Pt appears well, but is severely aphasic and I was unable to understand her.  Pt is in no acute distress.    Pt is unable to consent to for self.  Consent obtained from Livia Riffle, legal guardian via phone.    Electronically Signed: Pasty Bongo, PA-C 11/16/2023, 9:17 AM    I spent a total of 25 Minutes at the the patient's bedside AND on the patient's [facility] floor or unit, greater than 50% of which was counseling/coordinating care for image guided jejunal tube replacement.

## 2023-11-16 NOTE — ED Notes (Signed)
 Pt BG is 50. Provider made aware of findings. PT needs IV consult.

## 2023-11-16 NOTE — ED Notes (Signed)
 Nurse noted pt BG to be low based of labs. POC finger stick will be done.

## 2023-11-17 DIAGNOSIS — Z434 Encounter for attention to other artificial openings of digestive tract: Secondary | ICD-10-CM | POA: Diagnosis not present

## 2023-11-17 LAB — CBG MONITORING, ED: Glucose-Capillary: 103 mg/dL — ABNORMAL HIGH (ref 70–99)

## 2023-11-17 MED ORDER — FENTANYL CITRATE PF 50 MCG/ML IJ SOSY
50.0000 ug | PREFILLED_SYRINGE | Freq: Once | INTRAMUSCULAR | Status: AC
  Administered 2023-11-17: 50 ug via INTRAVENOUS
  Filled 2023-11-17: qty 1

## 2023-11-17 NOTE — Discharge Instructions (Addendum)
 Feeding tube has been replaced and is ready for use.

## 2023-11-17 NOTE — ED Notes (Signed)
  CBG 103  

## 2023-11-17 NOTE — ED Notes (Signed)
 Pt D/c to [facility]

## 2023-11-17 NOTE — ED Provider Notes (Signed)
 Patient had G-tube replaced earlier in the day.  Vitals remained stable.  Per radiology, G-tube is ready for use.  She appears stable to return to her facility.   Earma Gloss, MD 11/17/23 [phone]

## 2023-11-17 NOTE — ED Notes (Signed)
 [facility] arrived.

## 2024-03-23 ENCOUNTER — Other Ambulatory Visit: Payer: Self-pay

## 2024-03-23 ENCOUNTER — Encounter (HOSPITAL_COMMUNITY): Payer: Self-pay

## 2024-03-23 ENCOUNTER — Emergency Department (HOSPITAL_COMMUNITY)
Admission: EM | Admit: 2024-03-23 | Discharge: 2024-03-24 | Disposition: A | Discharge status: Home / Self Care | Source: Home / Self Care | Attending: Emergency Medicine | Admitting: Emergency Medicine

## 2024-03-23 DIAGNOSIS — J45909 Unspecified asthma, uncomplicated: Secondary | ICD-10-CM | POA: Insufficient documentation

## 2024-03-23 DIAGNOSIS — R109 Unspecified abdominal pain: Secondary | ICD-10-CM

## 2024-03-23 DIAGNOSIS — Z7982 Long term (current) use of aspirin: Secondary | ICD-10-CM | POA: Insufficient documentation

## 2024-03-23 DIAGNOSIS — Z79899 Other long term (current) drug therapy: Secondary | ICD-10-CM | POA: Insufficient documentation

## 2024-03-23 DIAGNOSIS — R140 Abdominal distension (gaseous): Secondary | ICD-10-CM | POA: Insufficient documentation

## 2024-03-23 DIAGNOSIS — E119 Type 2 diabetes mellitus without complications: Secondary | ICD-10-CM | POA: Insufficient documentation

## 2024-03-23 DIAGNOSIS — N12 Tubulo-interstitial nephritis, not specified as acute or chronic: Secondary | ICD-10-CM | POA: Insufficient documentation

## 2024-03-23 DIAGNOSIS — I10 Essential (primary) hypertension: Secondary | ICD-10-CM | POA: Insufficient documentation

## 2024-03-23 DIAGNOSIS — Z8673 Personal history of transient ischemic attack (TIA), and cerebral infarction without residual deficits: Secondary | ICD-10-CM | POA: Insufficient documentation

## 2024-03-23 LAB — CBC WITH DIFFERENTIAL/PLATELET
Abs Immature Granulocytes: 0.03 K/uL (ref 0.00–0.07)
Basophils Absolute: 0 K/uL (ref 0.0–0.1)
Basophils Relative: 0 %
Eosinophils Absolute: 0.3 K/uL (ref 0.0–0.5)
Eosinophils Relative: 3 %
HCT: 37.5 % (ref 36.0–46.0)
Hemoglobin: 13.2 g/dL (ref 12.0–15.0)
Immature Granulocytes: 0 %
Lymphocytes Relative: 48 %
Lymphs Abs: 4.4 K/uL — ABNORMAL HIGH (ref 0.7–4.0)
MCH: 36.8 pg — ABNORMAL HIGH (ref 26.0–34.0)
MCHC: 35.2 g/dL (ref 30.0–36.0)
MCV: 104.5 fL — ABNORMAL HIGH (ref 80.0–100.0)
Monocytes Absolute: 0.8 K/uL (ref 0.1–1.0)
Monocytes Relative: 9 %
Neutro Abs: 3.6 K/uL (ref 1.7–7.7)
Neutrophils Relative %: 40 %
Platelets: 98 K/uL — ABNORMAL LOW (ref 150–400)
RBC: 3.59 MIL/uL — ABNORMAL LOW (ref 3.87–5.11)
RDW: 11.9 % (ref 11.5–15.5)
Smear Review: NORMAL
WBC: 9.2 K/uL (ref 4.0–10.5)
nRBC: 0.4 % — ABNORMAL HIGH (ref 0.0–0.2)

## 2024-03-23 LAB — URINALYSIS, ROUTINE W REFLEX MICROSCOPIC
Bilirubin Urine: NEGATIVE
Glucose, UA: NEGATIVE mg/dL
Hgb urine dipstick: NEGATIVE
Ketones, ur: NEGATIVE mg/dL
Nitrite: NEGATIVE
Protein, ur: 100 mg/dL — AB
Specific Gravity, Urine: 1.015 (ref 1.005–1.030)
WBC, UA: >50 WBC/hpf (ref 0–5)
pH: 7 (ref 5.0–8.0)

## 2024-03-23 MED ORDER — LACTATED RINGERS IV BOLUS
500.0000 mL | Freq: Once | INTRAVENOUS | Status: AC
  Administered 2024-03-23: 500 mL via INTRAVENOUS

## 2024-03-23 NOTE — ED Provider Notes (Signed)
 [facility]   CSN: [id number] Arrival date & time: 03/23/24  1710     Patient presents with: No chief complaint on file.   Kathleen [facility] is a 73 y.o. female.  {Add pertinent medical, surgical, social history, OB history to YEP:67052} HPI Patient presents for G-tube dysfunction.  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 She resides in nursing facility. Per chart review, patient has a GJ tube.  She was seen in April for damage to tube.  She underwent IR replacement at the time.  Per staff at facility, the gauze around her G-tube was changed to this afternoon.  At the time, staff noticed blood from gastrostomy site external to the tube.  Patient was grimacing in pain at the time.  They attempted to draw back gastric contents but were unable to.  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  She was sent to the ED for further evaluation.    Prior to Admission medications   Medication Sig Start Date End Date Taking? Authorizing Provider  acetaminophen  (TYLENOL ) 160 MG/5ML solution Place 20 mLs (640 mg total) into feeding tube every 8 (eight) hours. Patient taking differently: Place 800 mg into feeding tube every 6 (six) hours. 12/03/22   Elgergawy, Brayton RAMAN, MD  acetic acid  0.25 % irrigation 20cc irrigation twice a day, flush foley to prevent clogging 11/06/23   [provider]  amiodarone  (PACERONE ) 200 MG tablet Place 1 tablet (200 mg total) into feeding tube daily. 12/03/22   Elgergawy, Brayton RAMAN, MD  amLODipine  (NORVASC ) 10 MG tablet Place 1 tablet (10 mg total) into feeding tube daily. Patient taking differently: Place 5 mg into feeding tube See admin instructions. 5 mg via tube once a day and hold for a Systolic reading less than 110 03/06/23   Odell Celinda Balo,  MD  ascorbic acid  (VITAMIN C ) 500 MG tablet Place 0.5 tablets (250 mg total) into feeding tube 2 (two) times daily. 06/09/23   Rai, Nydia POUR, MD  aspirin  81 MG chewable tablet Place 81 mg into feeding tube at bedtime.    [provider]  chlorhexidine  (PERIDEX ) 0.12 % solution Dab a toothbrush into 15 mLs and brush teeth 2 times a day    [provider]  cyclobenzaprine  (FLEXERIL ) 5 MG tablet Place 2.5 mg into feeding tube in the morning and at bedtime. Hold If Sedated    [provider]  diazePAM , 20 MG Dose, (VALTOCO  20 MG DOSE) 2 x 10 MG/0.1ML LQPK Place 20 mg into the nose daily as needed (seizures).    [provider]  estradiol (ESTRACE) 0.1 MG/GM vaginal cream Place vaginally See admin instructions. Apply a pea-sized amount to the urethra/vaginal wall on Tuesdays and Fridays    [provider]  famotidine  (PEPCID ) 40 MG/5ML suspension Place 40 mg into feeding tube daily. 03/25/22   [provider]  folic acid  (FOLVITE ) 800 MCG tablet Place 800 mcg into feeding tube daily.    [provider]  HYDROcodone -acetaminophen  (NORCO/VICODIN) 5-325 MG tablet Place 1 tablet into feeding tube 2 (two) times daily as needed for moderate pain (pain score 4-6).    [provider]  lactulose  (CHRONULAC ) 10 GM/15ML solution Take 20 g by mouth daily. 11/07/23   [provider]  levETIRAcetam  (KEPPRA ) 100 MG/ML solution Place 1,000 mg into feeding tube  2 (two) times daily.    [provider]  losartan  (COZAAR ) 25 MG tablet Place 1 tablet (25 mg total) into feeding tube daily. Patient taking differently: Place 25 mg into feeding tube daily. Hold for SBP <110 03/06/23   Odell Celinda Balo, MD  Menthol , Topical Analgesic, (BIOFREEZE) 4 % GEL Apply 2 g topically See admin instructions. Apply 2 grams topically to all limbs 2 times a day    [provider]  metoprolol  tartrate (LOPRESSOR ) 25 MG tablet Place 1 tablet (25 mg  total) into feeding tube 2 (two) times daily. 12/03/22   Elgergawy, Brayton RAMAN, MD  midodrine (PROAMATINE) 10 MG tablet Take 10 mg by mouth 3 (three) times daily as needed (if SBP<100, DBP<70). 10/07/23   [provider]  nutrition supplement, JUVEN, (JUVEN) PACK Place 1 packet into feeding tube 2 (two) times daily. Patient not taking: Reported on 06/29/2023 06/09/23   Rai, Nydia POUR, MD  Nutritional Supplements (FEEDING SUPPLEMENT, OSMOLITE 1.5 CAL,) LIQD Place 1,000 mLs into feeding tube continuous. Osmolite 1.5 @ 65 ml/hr x 18 hours, holding 1 hour before and 2 hours after each dilantin  administration Patient not taking: Reported on 06/29/2023 06/09/23   Rai, Nydia POUR, MD  polyethylene glycol (MIRALAX  / GLYCOLAX ) 17 g packet Place 34 g into feeding tube daily. 06/09/23   Rai, Ripudeep K, MD  senna-docusate (SENOKOT-S) 8.6-50 MG tablet Place 2 tablets into feeding tube 2 (two) times daily. 02/22/22   Cheryle Page, MD  simethicone  (MYLICON) 80 MG chewable tablet Place 80 mg into feeding tube 3 (three) times daily.    [provider]  simvastatin  (ZOCOR ) 10 MG tablet Place 1 tablet (10 mg total) into feeding tube at bedtime. 02/22/22   Cheryle Page, MD  torsemide  (DEMADEX ) 10 MG tablet Give 10 mg per tube in the morning and hold for a systolic reading less than 110    [provider]  valproic  acid (DEPAKENE ) 250 MG/5ML solution Place 500-750 mg into feeding tube See admin instructions. 500 mg via tube three times a day and 750 mg at bedtime    [provider]    Allergies: Tape, Demerol [meperidine hcl], Sulfonamide derivatives, Bacitracin-polymyxin b, and Oxycontin  [oxycodone ]    Review of Systems  Unable to perform ROS: Other (Chronic aphasia)    Updated Vital Signs BP 132/84 (BP Location: Left Wrist)   Pulse 67   Temp 97.7 F (36.5 C)   Resp 19   SpO2 96%   Physical Exam Vitals and nursing [facility] reviewed.  Constitutional:      General: She is not in  acute distress.    Appearance: Normal appearance. She is well-developed. She is not ill-appearing, toxic-appearing or diaphoretic.  HENT:     Head: Normocephalic and atraumatic.     Right Ear: External ear normal.     Left Ear: External ear normal.     Nose: Nose normal.     Mouth/Throat:     Mouth: Mucous membranes are moist.  Eyes:     Extraocular Movements: Extraocular movements intact.     Conjunctiva/sclera: Conjunctivae normal.  Cardiovascular:     Rate and Rhythm: Normal rate and regular rhythm.  Pulmonary:     Effort: Pulmonary effort is normal. No respiratory distress.  Abdominal:     General: There is distension.     Palpations: Abdomen is soft.     Tenderness: There is no abdominal tenderness.     Comments: G-tube in place.  No blood present  at this time.  Musculoskeletal:        General: No swelling.     Cervical back: Normal range of motion and neck supple.  Skin:    General: Skin is warm and dry.     Coloration: Skin is not jaundiced or pale.  Neurological:     Mental Status: She is alert. Mental status is at baseline.  Psychiatric:        Mood and Affect: Mood normal.        Behavior: Behavior normal.     (all labs ordered are listed, but only abnormal results are displayed) Labs Reviewed - No data to display  EKG: None  Radiology: No results found.  {Document cardiac monitor, telemetry assessment procedure when appropriate:32947} Procedures   Medications Ordered in the ED - No data to display    {Click here for ABCD2, HEART and other calculators REFRESH [facility] before signing:1}                              Medical Decision Making Amount and/or Complexity of Data Reviewed Labs: ordered.   This patient presents to the ED for concern of ***, this involves an extensive number of treatment options, and is a complaint that carries with it a high risk of complications and morbidity.  The differential diagnosis includes ***   Co morbidities / Chronic  conditions that complicate the patient evaluation  ***   Additional history obtained:  Additional history obtained from EMR External records from outside source obtained and reviewed including ***   Lab Tests:  I Ordered, and personally interpreted labs.  The pertinent results include:  ***   Imaging Studies ordered:  I ordered imaging studies including ***  I independently visualized and interpreted imaging which showed *** I agree with the radiologist interpretation   Cardiac Monitoring: / EKG:  The patient was maintained on a cardiac monitor.  I personally viewed and interpreted the cardiac monitored which showed an underlying rhythm of: ***   Problem List / ED Course / Critical interventions / Medication management  Patient presents from nursing facility for concern of G-tube dysfunction.  Staff at facility noticed some blood on skin around tube earlier today.  They were reportedly unable to draw back stomach contents.  On arrival in the ED, vital signs are normal.  Patient is overall well-appearing on exam.  She has abdominal distention which is likely secondary to her chronic phonic ileus.  G-tube is in place in left upper quadrant.  No blood is present on skin at this time.  No tenderness is appreciated on exam.  Will obtain lab work and imaging studies.*** I ordered medication including ***   Reevaluation of the patient after these medicines showed that the patient *** I have reviewed the patients home medicines and have made adjustments as needed   Consultations Obtained:  I requested consultation with the ***,  and discussed lab and imaging findings as well as pertinent plan - they recommend: ***   Social Determinants of Health:  ***   Test / Admission - Considered:  ***   {Document critical care time when appropriate  Document review of labs and clinical decision tools ie CHADS2VASC2, etc  Document your independent review of radiology images and any  outside records  Document your discussion with family members, caretakers and with consultants  Document social determinants of health affecting pt's care  Document your decision making why or why  not admission, treatments were needed:32947:::1}   Final diagnoses:  None    ED Discharge Orders     None

## 2024-03-23 NOTE — ED Notes (Signed)
 PT foley bag empty.Unable to grab urine at this time. Will bladder scan.Physician notified.

## 2024-03-23 NOTE — ED Triage Notes (Signed)
 Pt from [facility] for dislodged G tube with blood coming from it since yesterday. Pt at baseline mentation per staff.

## 2024-03-23 NOTE — ED Notes (Signed)
 IV team unable to get blood. Mini lab contacted again

## 2024-03-24 ENCOUNTER — Emergency Department (HOSPITAL_COMMUNITY)

## 2024-03-24 LAB — COMPREHENSIVE METABOLIC PANEL WITH GFR
ALT: 22 U/L (ref 0–44)
AST: 49 U/L — ABNORMAL HIGH (ref 15–41)
Albumin: 2 g/dL — ABNORMAL LOW (ref 3.5–5.0)
Alkaline Phosphatase: 141 U/L — ABNORMAL HIGH (ref 38–126)
Anion gap: 9 (ref 5–15)
BUN: 31 mg/dL — ABNORMAL HIGH (ref 8–23)
CO2: 30 mmol/L (ref 22–32)
Calcium: 7.9 mg/dL — ABNORMAL LOW (ref 8.9–10.3)
Chloride: 96 mmol/L — ABNORMAL LOW (ref 98–111)
Creatinine, Ser: 0.87 mg/dL (ref 0.44–1.00)
GFR, Estimated: >60 mL/min (ref >60)
Glucose, Bld: 83 mg/dL (ref 70–99)
Potassium: 3.6 mmol/L (ref 3.5–5.1)
Sodium: 135 mmol/L (ref 135–145)
Total Bilirubin: 0.5 mg/dL (ref 0.0–1.2)
Total Protein: 8.3 g/dL — ABNORMAL HIGH (ref 6.5–8.1)

## 2024-03-24 LAB — MAGNESIUM: Magnesium: 2.2 mg/dL (ref 1.7–2.4)

## 2024-03-24 LAB — LIPASE, BLOOD: Lipase: 30 U/L (ref 11–51)

## 2024-03-24 MED ORDER — SODIUM CHLORIDE 0.9 % IV SOLN
2.0000 g | Freq: Once | INTRAVENOUS | Status: AC
  Administered 2024-03-24: 2 g via INTRAVENOUS
  Filled 2024-03-24: qty 20

## 2024-03-24 MED ORDER — CEFPODOXIME PROXETIL 200 MG PO TABS: 200.0000 mg | ORAL_TABLET | Freq: Two times a day (BID) | ORAL | 0 refills | Status: DC

## 2024-03-24 MED ORDER — IOHEXOL 350 MG/ML SOLN
75.0000 mL | Freq: Once | INTRAVENOUS | Status: AC | PRN
  Administered 2024-03-24: 75 mL via INTRAVENOUS

## 2024-03-24 NOTE — ED Notes (Signed)
 [facility] contacted to replace the thermometer.

## 2024-03-24 NOTE — ED Provider Notes (Signed)
 Care of patient received from prior provider at 5:12 AM, please see their note for complete H/P and care plan.  Received handoff per ED course.  Clinical Course as of 03/24/24 9487  Wed Mar 24, 2024  0117 Stable Gtube dislodged and bleeding Needs IR if dislodged due to GJ Generalized PAIN pending CT [CC]    Clinical Course User Index [CC] Jerral Meth, MD    Reassessment: CT scan showed pyelonephritis.  She lives at a skilled nursing facility.  She was antibiosis here in the emergency room and stabilized appropriately.  She can continue antibiotics at her skilled nursing facility.  Broad coverage antibiotics sent to pharmacy pending culture report and asynchronous follow-up per departmental policy.     Jerral Meth, MD 03/24/24 [phone]

## 2024-03-24 NOTE — ED Notes (Signed)
 Patient transported to CT

## 2024-03-25 ENCOUNTER — Emergency Department (HOSPITAL_COMMUNITY)
Admission: EM | Admit: 2024-03-25 | Discharge: 2024-03-25 | Disposition: A | Discharge status: Home / Self Care | Source: Home / Self Care | Attending: Emergency Medicine | Admitting: Emergency Medicine

## 2024-03-25 ENCOUNTER — Encounter (HOSPITAL_COMMUNITY): Payer: Self-pay | Admitting: Emergency Medicine

## 2024-03-25 ENCOUNTER — Encounter (HOSPITAL_COMMUNITY): Payer: Self-pay

## 2024-03-25 ENCOUNTER — Other Ambulatory Visit: Payer: Self-pay

## 2024-03-25 ENCOUNTER — Inpatient Hospital Stay (HOSPITAL_COMMUNITY)
Admission: EM | Admit: 2024-03-25 | Discharge: 2024-04-06 | DRG: 314 | Disposition: A | Discharge status: Skilled Nursing Facility | Source: Skilled Nursing Facility

## 2024-03-25 ENCOUNTER — Telehealth (HOSPITAL_COMMUNITY): Payer: Self-pay | Admitting: *Deleted

## 2024-03-25 DIAGNOSIS — I4891 Unspecified atrial fibrillation: Secondary | ICD-10-CM | POA: Diagnosis not present

## 2024-03-25 DIAGNOSIS — Z91048 Other nonmedicinal substance allergy status: Secondary | ICD-10-CM

## 2024-03-25 DIAGNOSIS — L89156 Pressure-induced deep tissue damage of sacral region: Secondary | ICD-10-CM | POA: Diagnosis present

## 2024-03-25 DIAGNOSIS — Z01818 Encounter for other preprocedural examination: Secondary | ICD-10-CM | POA: Diagnosis not present

## 2024-03-25 DIAGNOSIS — Y831 Surgical operation with implant of artificial internal device as the cause of abnormal reaction of the patient, or of later complication, without mention of misadventure at the time of the procedure: Secondary | ICD-10-CM | POA: Diagnosis present

## 2024-03-25 DIAGNOSIS — Z950 Presence of cardiac pacemaker: Secondary | ICD-10-CM

## 2024-03-25 DIAGNOSIS — I69320 Aphasia following cerebral infarction: Secondary | ICD-10-CM

## 2024-03-25 DIAGNOSIS — I69351 Hemiplegia and hemiparesis following cerebral infarction affecting right dominant side: Secondary | ICD-10-CM | POA: Diagnosis not present

## 2024-03-25 DIAGNOSIS — N179 Acute kidney failure, unspecified: Secondary | ICD-10-CM

## 2024-03-25 DIAGNOSIS — R7881 Bacteremia: Principal | ICD-10-CM | POA: Diagnosis present

## 2024-03-25 DIAGNOSIS — S31000A Unspecified open wound of lower back and pelvis without penetration into retroperitoneum, initial encounter: Secondary | ICD-10-CM | POA: Diagnosis present

## 2024-03-25 DIAGNOSIS — E11649 Type 2 diabetes mellitus with hypoglycemia without coma: Secondary | ICD-10-CM | POA: Diagnosis not present

## 2024-03-25 DIAGNOSIS — I10 Essential (primary) hypertension: Secondary | ICD-10-CM | POA: Diagnosis present

## 2024-03-25 DIAGNOSIS — Z7982 Long term (current) use of aspirin: Secondary | ICD-10-CM

## 2024-03-25 DIAGNOSIS — Z931 Gastrostomy status: Secondary | ICD-10-CM

## 2024-03-25 DIAGNOSIS — I330 Acute and subacute infective endocarditis: Secondary | ICD-10-CM | POA: Diagnosis present

## 2024-03-25 DIAGNOSIS — I480 Paroxysmal atrial fibrillation: Secondary | ICD-10-CM | POA: Diagnosis present

## 2024-03-25 DIAGNOSIS — B964 Proteus (mirabilis) (morganii) as the cause of diseases classified elsewhere: Secondary | ICD-10-CM | POA: Diagnosis present

## 2024-03-25 DIAGNOSIS — Z882 Allergy status to sulfonamides status: Secondary | ICD-10-CM

## 2024-03-25 DIAGNOSIS — E039 Hypothyroidism, unspecified: Secondary | ICD-10-CM | POA: Diagnosis present

## 2024-03-25 DIAGNOSIS — Z885 Allergy status to narcotic agent status: Secondary | ICD-10-CM

## 2024-03-25 DIAGNOSIS — D696 Thrombocytopenia, unspecified: Secondary | ICD-10-CM | POA: Diagnosis present

## 2024-03-25 DIAGNOSIS — T827XXA Infection and inflammatory reaction due to other cardiac and vascular devices, implants and grafts, initial encounter: Principal | ICD-10-CM | POA: Diagnosis present

## 2024-03-25 DIAGNOSIS — I69322 Dysarthria following cerebral infarction: Secondary | ICD-10-CM | POA: Diagnosis not present

## 2024-03-25 DIAGNOSIS — Z789 Other specified health status: Secondary | ICD-10-CM

## 2024-03-25 DIAGNOSIS — E162 Hypoglycemia, unspecified: Secondary | ICD-10-CM | POA: Insufficient documentation

## 2024-03-25 DIAGNOSIS — K9423 Gastrostomy malfunction: Secondary | ICD-10-CM

## 2024-03-25 DIAGNOSIS — Z79899 Other long term (current) drug therapy: Secondary | ICD-10-CM

## 2024-03-25 DIAGNOSIS — I495 Sick sinus syndrome: Secondary | ICD-10-CM | POA: Diagnosis present

## 2024-03-25 DIAGNOSIS — I482 Chronic atrial fibrillation, unspecified: Secondary | ICD-10-CM | POA: Diagnosis present

## 2024-03-25 DIAGNOSIS — E785 Hyperlipidemia, unspecified: Secondary | ICD-10-CM | POA: Diagnosis present

## 2024-03-25 DIAGNOSIS — E876 Hypokalemia: Secondary | ICD-10-CM

## 2024-03-25 DIAGNOSIS — Z87891 Personal history of nicotine dependence: Secondary | ICD-10-CM | POA: Diagnosis not present

## 2024-03-25 DIAGNOSIS — K567 Ileus, unspecified: Secondary | ICD-10-CM | POA: Diagnosis present

## 2024-03-25 DIAGNOSIS — G4733 Obstructive sleep apnea (adult) (pediatric): Secondary | ICD-10-CM | POA: Diagnosis present

## 2024-03-25 DIAGNOSIS — K219 Gastro-esophageal reflux disease without esophagitis: Secondary | ICD-10-CM | POA: Diagnosis present

## 2024-03-25 DIAGNOSIS — R899 Unspecified abnormal finding in specimens from other organs, systems and tissues: Secondary | ICD-10-CM | POA: Insufficient documentation

## 2024-03-25 DIAGNOSIS — G40909 Epilepsy, unspecified, not intractable, without status epilepticus: Secondary | ICD-10-CM | POA: Diagnosis present

## 2024-03-25 DIAGNOSIS — I38 Endocarditis, valve unspecified: Secondary | ICD-10-CM | POA: Diagnosis not present

## 2024-03-25 DIAGNOSIS — T827XXD Infection and inflammatory reaction due to other cardiac and vascular devices, implants and grafts, subsequent encounter: Secondary | ICD-10-CM | POA: Diagnosis not present

## 2024-03-25 DIAGNOSIS — N12 Tubulo-interstitial nephritis, not specified as acute or chronic: Secondary | ICD-10-CM

## 2024-03-25 DIAGNOSIS — Z833 Family history of diabetes mellitus: Secondary | ICD-10-CM

## 2024-03-25 DIAGNOSIS — Z9884 Bariatric surgery status: Secondary | ICD-10-CM

## 2024-03-25 DIAGNOSIS — B957 Other staphylococcus as the cause of diseases classified elsewhere: Secondary | ICD-10-CM

## 2024-03-25 DIAGNOSIS — I679 Cerebrovascular disease, unspecified: Secondary | ICD-10-CM | POA: Diagnosis not present

## 2024-03-25 LAB — BASIC METABOLIC PANEL WITH GFR
Anion gap: 7 (ref 5–15)
BUN: 39 mg/dL — ABNORMAL HIGH (ref 8–23)
CO2: 32 mmol/L (ref 22–32)
Calcium: 8 mg/dL — ABNORMAL LOW (ref 8.9–10.3)
Chloride: 99 mmol/L (ref 98–111)
Creatinine, Ser: 1.03 mg/dL — ABNORMAL HIGH (ref 0.44–1.00)
GFR, Estimated: 57 mL/min — ABNORMAL LOW (ref >60)
Glucose, Bld: 78 mg/dL (ref 70–99)
Potassium: 3.8 mmol/L (ref 3.5–5.1)
Sodium: 138 mmol/L (ref 135–145)

## 2024-03-25 LAB — BLOOD CULTURE ID PANEL (REFLEXED) - BCID2

## 2024-03-25 LAB — CBC WITH DIFFERENTIAL/PLATELET
Abs Immature Granulocytes: 0.02 K/uL (ref 0.00–0.07)
Basophils Absolute: 0 K/uL (ref 0.0–0.1)
Basophils Relative: 1 %
Eosinophils Absolute: 0.2 K/uL (ref 0.0–0.5)
Eosinophils Relative: 3 %
HCT: 46.8 % — ABNORMAL HIGH (ref 36.0–46.0)
Hemoglobin: 15.8 g/dL — ABNORMAL HIGH (ref 12.0–15.0)
Immature Granulocytes: 0 %
Lymphocytes Relative: 52 %
Lymphs Abs: 2.7 K/uL (ref 0.7–4.0)
MCH: 36.3 pg — ABNORMAL HIGH (ref 26.0–34.0)
MCHC: 33.8 g/dL (ref 30.0–36.0)
MCV: 107.6 fL — ABNORMAL HIGH (ref 80.0–100.0)
Monocytes Absolute: 0.6 K/uL (ref 0.1–1.0)
Monocytes Relative: 12 %
Neutro Abs: 1.7 K/uL (ref 1.7–7.7)
Neutrophils Relative %: 32 %
Platelets: 137 K/uL — ABNORMAL LOW (ref 150–400)
RBC: 4.35 MIL/uL (ref 3.87–5.11)
RDW: 12.5 % (ref 11.5–15.5)
WBC: 5.2 K/uL (ref 4.0–10.5)
nRBC: 0 % (ref 0.0–0.2)

## 2024-03-25 LAB — GLUCOSE, CAPILLARY
Glucose-Capillary: 64 mg/dL — ABNORMAL LOW (ref 70–99)
Glucose-Capillary: 162 mg/dL — ABNORMAL HIGH (ref 70–99)
Glucose-Capillary: 131 mg/dL — ABNORMAL HIGH (ref 70–99)
Glucose-Capillary: 90 mg/dL (ref 70–99)

## 2024-03-25 LAB — LACTIC ACID, PLASMA: Lactic Acid, Venous: 1.9 mmol/L (ref 0.5–1.9)

## 2024-03-25 MED ORDER — DEXTROSE 50 % IV SOLN
INTRAVENOUS | Status: AC
  Administered 2024-03-25: 50 mL via INTRAVENOUS
  Filled 2024-03-25: qty 50

## 2024-03-25 MED ORDER — OSMOLITE 1.5 CAL PO LIQD: 1000.0000 mL | ORAL | Status: DC

## 2024-03-25 MED ORDER — SIMVASTATIN 20 MG PO TABS
10.0000 mg | ORAL_TABLET | Freq: Every day | ORAL | Status: DC
  Administered 2024-03-25 – 2024-04-05 (×11): 10 mg
  Filled 2024-03-25 (×11): qty 1

## 2024-03-25 MED ORDER — FREE WATER
150.0000 mL | Status: DC
  Administered 2024-03-25 – 2024-04-06 (×63): 150 mL

## 2024-03-25 MED ORDER — LOSARTAN POTASSIUM 50 MG PO TABS
25.0000 mg | ORAL_TABLET | Freq: Every day | ORAL | Status: DC
  Administered 2024-03-25 – 2024-04-06 (×12): 25 mg
  Filled 2024-03-25 (×12): qty 1

## 2024-03-25 MED ORDER — TORSEMIDE 20 MG PO TABS
10.0000 mg | ORAL_TABLET | Freq: Two times a day (BID) | ORAL | Status: DC
  Administered 2024-03-25 – 2024-04-06 (×22): 10 mg
  Filled 2024-03-25 (×23): qty 1

## 2024-03-25 MED ORDER — OSMOLITE 1.5 CAL PO LIQD
1000.0000 mL | ORAL | Status: DC
  Administered 2024-03-25: 1000 mL
  Filled 2024-03-25: qty 1000

## 2024-03-25 MED ORDER — VANCOMYCIN HCL 1250 MG/250ML IV SOLN
1250.0000 mg | Freq: Two times a day (BID) | INTRAVENOUS | Status: DC
  Administered 2024-03-26 – 2024-03-28 (×6): 1250 mg via INTRAVENOUS
  Filled 2024-03-25 (×7): qty 250

## 2024-03-25 MED ORDER — ENOXAPARIN SODIUM 40 MG/0.4ML IJ SOSY
40.0000 mg | PREFILLED_SYRINGE | INTRAMUSCULAR | Status: DC
  Administered 2024-03-25: 40 mg via SUBCUTANEOUS
  Filled 2024-03-25: qty 0.4

## 2024-03-25 MED ORDER — VALPROIC ACID 250 MG/5ML PO SOLN
750.0000 mg | Freq: Every day | ORAL | Status: DC
  Administered 2024-03-25 – 2024-04-05 (×11): 750 mg
  Filled 2024-03-25 (×13): qty 15

## 2024-03-25 MED ORDER — VALPROIC ACID 250 MG/5ML PO SOLN
500.0000 mg | Freq: Three times a day (TID) | ORAL | Status: DC
  Filled 2024-03-25 (×2): qty 10

## 2024-03-25 MED ORDER — CYCLOBENZAPRINE HCL 5 MG PO TABS
2.5000 mg | ORAL_TABLET | Freq: Two times a day (BID) | ORAL | Status: DC
  Administered 2024-03-25 – 2024-04-06 (×23): 2.5 mg
  Filled 2024-03-25 (×24): qty 1

## 2024-03-25 MED ORDER — ACETAMINOPHEN 160 MG/5ML PO SOLN
650.0000 mg | Freq: Three times a day (TID) | ORAL | Status: DC
  Administered 2024-03-25 – 2024-03-31 (×16): 650 mg
  Filled 2024-03-25 (×15): qty 20.3

## 2024-03-25 MED ORDER — HYDROCODONE-ACETAMINOPHEN 5-325 MG PO TABS
1.0000 | ORAL_TABLET | Freq: Two times a day (BID) | ORAL | Status: DC | PRN
  Administered 2024-03-25 – 2024-04-06 (×15): 1
  Filled 2024-03-25 (×15): qty 1

## 2024-03-25 MED ORDER — LACTATED RINGERS IV BOLUS
500.0000 mL | Freq: Once | INTRAVENOUS | Status: AC
  Administered 2024-03-25: 500 mL via INTRAVENOUS

## 2024-03-25 MED ORDER — LACTULOSE 10 GM/15ML PO SOLN
30.0000 g | Freq: Every day | ORAL | Status: DC
  Administered 2024-03-25: 30 g via ORAL
  Filled 2024-03-25: qty 45

## 2024-03-25 MED ORDER — SIMETHICONE 80 MG PO CHEW
80.0000 mg | CHEWABLE_TABLET | Freq: Three times a day (TID) | ORAL | Status: DC
  Administered 2024-03-25 – 2024-04-06 (×33): 80 mg
  Filled 2024-03-25 (×32): qty 1

## 2024-03-25 MED ORDER — AMIODARONE HCL 200 MG PO TABS
200.0000 mg | ORAL_TABLET | Freq: Every day | ORAL | Status: DC
  Administered 2024-03-25 – 2024-04-06 (×12): 200 mg
  Filled 2024-03-25 (×13): qty 1

## 2024-03-25 MED ORDER — OSMOLITE 1.5 CAL PO LIQD
1000.0000 mL | ORAL | Status: DC
  Filled 2024-03-25: qty 1000

## 2024-03-25 MED ORDER — LEVETIRACETAM 100 MG/ML PO SOLN
1000.0000 mg | Freq: Two times a day (BID) | ORAL | Status: DC
  Administered 2024-03-25 – 2024-04-06 (×22): 1000 mg
  Filled 2024-03-25 (×30): qty 10

## 2024-03-25 MED ORDER — OSMOLITE 1.2 CAL PO LIQD: 1000.0000 mL | ORAL | Status: DC

## 2024-03-25 MED ORDER — POLYETHYLENE GLYCOL 3350 17 G PO PACK
34.0000 g | PACK | Freq: Every day | ORAL | Status: DC
  Administered 2024-03-25 – 2024-04-05 (×9): 34 g
  Filled 2024-03-25 (×11): qty 2

## 2024-03-25 MED ORDER — LACTULOSE 10 GM/15ML PO SOLN
30.0000 g | Freq: Every day | ORAL | Status: DC
  Administered 2024-03-26 – 2024-04-06 (×11): 30 g
  Filled 2024-03-25 (×11): qty 45

## 2024-03-25 MED ORDER — METOPROLOL TARTRATE 25 MG PO TABS
25.0000 mg | ORAL_TABLET | Freq: Two times a day (BID) | ORAL | Status: DC
  Administered 2024-03-25 – 2024-04-06 (×19): 25 mg
  Filled 2024-03-25 (×21): qty 1

## 2024-03-25 MED ORDER — ACETIC ACID 0.25 % IR SOLN: Freq: Two times a day (BID) | Status: DC

## 2024-03-25 MED ORDER — ASPIRIN 81 MG PO CHEW
81.0000 mg | CHEWABLE_TABLET | Freq: Every day | ORAL | Status: DC
  Administered 2024-03-25 – 2024-04-05 (×11): 81 mg
  Filled 2024-03-25 (×11): qty 1

## 2024-03-25 MED ORDER — VANCOMYCIN HCL 1750 MG/350ML IV SOLN
1750.0000 mg | Freq: Once | INTRAVENOUS | Status: AC
  Administered 2024-03-25: 1750 mg via INTRAVENOUS
  Filled 2024-03-25 (×2): qty 350

## 2024-03-25 MED ORDER — DEXTROSE 50 % IV SOLN: 1.0000 | Freq: Once | INTRAVENOUS | Status: AC

## 2024-03-25 MED ORDER — FAMOTIDINE 40 MG/5ML PO SUSR
40.0000 mg | Freq: Every day | ORAL | Status: DC
  Administered 2024-03-25 – 2024-04-06 (×12): 40 mg
  Filled 2024-03-25 (×13): qty 5

## 2024-03-25 MED ORDER — CHLORHEXIDINE GLUCONATE CLOTH 2 % EX PADS
6.0000 | MEDICATED_PAD | Freq: Every day | CUTANEOUS | Status: DC
  Administered 2024-03-25 – 2024-04-06 (×12): 6 via TOPICAL

## 2024-03-25 MED ORDER — SODIUM CHLORIDE 0.9 % IV SOLN
2.0000 g | Freq: Once | INTRAVENOUS | Status: AC
  Administered 2024-03-25: 2 g via INTRAVENOUS
  Filled 2024-03-25: qty 20

## 2024-03-25 MED ORDER — VALPROIC ACID 250 MG/5ML PO SOLN
500.0000 mg | Freq: Three times a day (TID) | ORAL | Status: DC
  Administered 2024-03-25 – 2024-04-06 (×31): 500 mg
  Filled 2024-03-25 (×38): qty 10

## 2024-03-25 MED ORDER — SODIUM CHLORIDE 0.9 % IV SOLN
2.0000 g | INTRAVENOUS | Status: AC
  Administered 2024-03-26 – 2024-03-30 (×5): 2 g via INTRAVENOUS
  Filled 2024-03-25 (×5): qty 20

## 2024-03-25 NOTE — TOC Initial Note (Signed)
 Transition of Care [facility]) - Initial/Assessment Note    Patient Details  Name: Kathleen Jensen MRN: [id number] Date of Birth: [birth year]  Transition of [facility]) CM/SW Contact:    Lauraine FORBES Saa, LCSWA Phone Number: 03/25/2024, 3:30 PM  Clinical Narrative:                  3:30 PM Per chart review, patient is from [facility]. SNF admissions confirmed patient is LTC at [facility] and is able to return upon discharge. Patient has a PCP and insurance but does not have a preferred pharmacy. Patient has HH history with [facility] and DME (trapeze, transfer board, shower stool, hospital bed) history with [facility]. CSW will continue to follow and be available to assist.  Expected Discharge Plan: Long Term Nursing Home Barriers to Discharge: Continued Medical Work up   Patient Goals and CMS Choice            Expected Discharge Plan and Services In-house Referral: Clinical Social Work   Post Acute Care Choice: Skilled Nursing Facility Living arrangements for the past 2 months: Skilled Nursing Facility                                      Prior Living Arrangements/Services Living arrangements for the past 2 months: Skilled Nursing Facility Lives with:: Facility Resident Patient language and need for interpreter reviewed:: Yes          Care giver support system in place?: Yes (comment)   Criminal Activity/Legal Involvement Pertinent to Current Situation/Hospitalization: No - Comment as needed  Activities of Daily Living      Permission Sought/Granted Permission sought to share information with : Facility Medical sales representative, Guardian Permission granted to share information with : No (Contact information on chart)  Share Information with NAME: [name] Search  Permission granted to share info w AGENCY: [facility] LTC  Permission granted to share info w Relationship: Legal Guardian  Permission granted to share info w Contact Information:  [phone]  Emotional Assessment         Alcohol / Substance Use: Not Applicable Psych Involvement: No (comment)  Admission diagnosis:  Bacteremia [R78.81] Patient Active Problem List   Diagnosis Date Noted   Bacteremia 03/25/2024   Pyelonephritis 03/25/2024   Chronic health problem 03/25/2024   Ileus (HCC) 06/06/2023   Acute cystitis 03/01/2023   Delirium secondary to UTI 03/01/2023   Atrial fibrillation, chronic (HCC) 03/01/2023   History of seizure 03/01/2023   Chronic hyponatremia 03/01/2023   Acute respiratory failure (HCC) 11/16/2022   Abdominal distension 11/16/2022   AMS (altered mental status) 05/17/2022   Altered mental status 05/15/2022   Sacral wound 05/15/2022   Intracranial hemorrhage (HCC)    Pressure injury of skin 02/18/2022   Chronic indwelling Foley catheter 02/18/2022   PAF (paroxysmal atrial fibrillation) (HCC) 02/18/2022   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HCC) 02/18/2022   Septic shock (HCC) 02/15/2022   Constipation    Abdominal pain    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   History of right hemiplegia (HCC) 04/12/2020   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   Hyperlipidemia associated with type 2 diabetes mellitus (HCC) 07/30/2018   History of CVA (cerebrovascular accident) 07/30/2018   Vitamin B 12 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/30/2018   Asthma 03/13/2018   Seizures (HCC) 01/12/2018   CVA (cerebral vascular accident) (HCC) 12/08/2017   Abnormal thyroid   blood test 12/08/2017   Pulmonary embolism and infarction [facility]) 12/08/2017   Chronic anemia 12/08/2017   History of expressive aphasia 10/15/2017   Seizure disorder (HCC) 10/15/2017   Dysphagia 10/15/2017   Hyperlipidemia 09/22/2017   Urinary frequency 09/22/2017   Airway hyperreactivity 01/26/2016   Disease of thyroid  gland 01/26/2016   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   Other specified postprocedural states 01/26/2016   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    Chronic low back pain 04/10/2015   Degeneration of intervertebral disc of lumbar region 04/10/2015   Spondylolisthesis of lumbar region 04/10/2015   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   History of cardiac pacemaker in situ 01/09/2015   B12 DEFICIENCY 08/15/2008   Vitamin D  deficiency 08/15/2008   Sick sinus syndrome (HCC) 08/15/2008   OBSTRUCTIVE SLEEP APNEA 08/11/2008   INSOMNIA 04/28/2008   Hyperlipidemia LDL goal <70 12/12/2006   RHINITIS, ALLERGIC NEC 12/12/2006   Hypothyroidism 12/11/2006   Essential hypertension 12/11/2006   GERD 12/11/2006   STRESS INCONTINENCE 12/11/2006   PCP:  System, Provider Not In Pharmacy:  No Pharmacies Listed    Social Drivers of Health (SDOH) Social History: SDOH Screenings   Food Insecurity: No Food Insecurity (06/06/2023)  Housing: Low Risk  (06/06/2023)  Transportation Needs: No Transportation Needs (06/06/2023)  Utilities: Not At Risk (06/06/2023)  Depression (PHQ2-9): Low Risk  (05/22/2019)  Tobacco Use: Medium Risk (03/25/2024)   SDOH Interventions:     Readmission Risk Interventions    02/18/2022    2:23 PM  Readmission Risk Prevention Plan  Transportation Screening Complete  PCP or Specialist Appt within 5-7 Days Complete  Home Care Screening Complete  Medication Review (RN CM) Complete

## 2024-03-25 NOTE — Plan of Care (Signed)
  Problem: Clinical Measurements: Goal: Respiratory complications will improve Outcome: Progressing   Problem: Clinical Measurements: Goal: Cardiovascular complication will be avoided Outcome: Progressing   Problem: Coping: Goal: Level of anxiety will decrease Outcome: Progressing   Problem: Elimination: Goal: Will not experience complications related to bowel motility Outcome: Progressing   Problem: Pain Managment: Goal: General experience of comfort will improve and/or be controlled Outcome: Progressing   Problem: Safety: Goal: Ability to remain free from injury will improve Outcome: Progressing

## 2024-03-25 NOTE — Assessment & Plan Note (Signed)
 Cr 1.07. Likely pre-renal (per nursing staff did not receive feeds or flushes at facility, and patient in ED for nearly 24 hr period without her flushes). -Restart free water  flushes/tube feeds -daily BMP

## 2024-03-25 NOTE — ED Triage Notes (Signed)
 BIB [facility], pt was called and encouraged to come back to ER r/t positive blood cultures. Pt is alert and cooperative.   BP 114/72 HR 88 Spo2 95 Resp 16 CBG 152

## 2024-03-25 NOTE — Assessment & Plan Note (Addendum)
 Located in epigastric region. History of chronic dysfunction and replacements by IR. - RD consult, appreciate recommendations - Per nursing facility, patient is n.p.o. all nutrition/meds through tube - Care precautions - Consult IR if tube needs replacement

## 2024-03-25 NOTE — Assessment & Plan Note (Addendum)
 CT abdomen 8/27 showed patchy enhancement within the right kidney which persists on delayed images suspicious for pyelonephritis.  Urine culture on 8/26 growing providencia stuartii-pending sensitivities. - Antibiotics as above - Messaged nursing to exchange Foley catheter

## 2024-03-25 NOTE — ED Provider Notes (Signed)
 [place] EMERGENCY DEPARTMENT AT [facility] Provider Note   CSN: [id number] Arrival date & time: 03/25/24  [id number]     Patient presents with: Abnormal labs   Kathleen Jensen is a 73 y.o. female.   Poor historian.  Patient presents the ER secondary to positive blood cultures from yesterday.  I reviewed those cultures and they show likely contaminant with staph epidermis.  Patient denies any new symptoms but is difficult to tell how reliable she is.  She is on antibiotics at her facility.        Prior to Admission medications   Medication Sig Start Date End Date Taking? Authorizing Provider  acetaminophen  (TYLENOL ) 160 MG/5ML solution Place 20 mLs (640 mg total) into feeding tube every 8 (eight) hours. Patient taking differently: Place 800 mg into feeding tube every 6 (six) hours. 12/03/22   Elgergawy, Brayton RAMAN, MD  acetic acid  0.25 % irrigation 20cc irrigation twice a day, flush foley to prevent clogging 11/06/23   [provider]  amiodarone  (PACERONE ) 200 MG tablet Place 1 tablet (200 mg total) into feeding tube daily. 12/03/22   Elgergawy, Brayton RAMAN, MD  ascorbic acid  (VITAMIN C ) 500 MG tablet Place 0.5 tablets (250 mg total) into feeding tube 2 (two) times daily. 06/09/23   Rai, Nydia POUR, MD  aspirin  81 MG chewable tablet Place 81 mg into feeding tube at bedtime.    [provider]  cefpodoxime  (VANTIN ) 200 MG tablet Take 1 tablet (200 mg total) by mouth 2 (two) times daily. 03/24/24   Jerral Meth, MD  chlorhexidine  (PERIDEX ) 0.12 % solution Dab a toothbrush into 15 mLs and brush teeth 2 times a day    [provider]  Cranberry 425 MG CAPS Place 425 mg into feeding tube in the morning and at bedtime.    [provider]  cyclobenzaprine  (FLEXERIL ) 5 MG tablet Place 2.5 mg into feeding tube in the morning and at bedtime. Hold If Sedated    [provider]  diazePAM , 20 MG Dose, (VALTOCO  20 MG DOSE) 2 x 10 MG/0.1ML LQPK  Place 20 mg into the nose daily as needed (seizures). Patient not taking: Reported on 03/24/2024    [provider]  docusate sodium  (COLACE) 100 MG capsule Give 1 capsule per tube twice a day    [provider]  estradiol (ESTRACE) 0.1 MG/GM vaginal cream Place vaginally See admin instructions. Apply a pea-sized amount to the urethra/vaginal wall on Tuesdays and Fridays    [provider]  famotidine  (PEPCID ) 40 MG/5ML suspension Place 40 mg into feeding tube daily. 03/25/22   [provider]  folic acid  (FOLVITE ) 800 MCG tablet Place 800 mcg into feeding tube daily.    [provider]  HYDROcodone -acetaminophen  (NORCO/VICODIN) 5-325 MG tablet Place 1 tablet into feeding tube 2 (two) times daily as needed for moderate pain (pain score 4-6).    [provider]  lactulose  (CHRONULAC ) 10 GM/15ML solution Take 20 g by mouth daily. 11/07/23   [provider]  levETIRAcetam  (KEPPRA ) 100 MG/ML solution Place 1,000 mg into feeding tube 2 (two) times daily.    [provider]  losartan  (COZAAR ) 25 MG tablet Place 1 tablet (25 mg total) into feeding tube daily. Patient taking differently: Place 25 mg into feeding tube daily. Hold for SBP <110 03/06/23   Odell Celinda Balo, MD  Menthol , Topical Analgesic, (BIOFREEZE) 4 % GEL Apply 2 g topically See admin instructions. Apply 2 grams topically to all  limbs 2 times a day    [provider]  metoprolol  tartrate (LOPRESSOR ) 25 MG tablet Place 1 tablet (25 mg total) into feeding tube 2 (two) times daily. 12/03/22   Elgergawy, Brayton RAMAN, MD  nutrition supplement, JUVEN, (JUVEN) PACK Place 1 packet into feeding tube 2 (two) times daily. Patient not taking: Reported on 06/29/2023 06/09/23   Rai, Nydia POUR, MD  Nutritional Supplements (FEEDING SUPPLEMENT, OSMOLITE 1.5 CAL,) LIQD Place 1,000 mLs into feeding tube continuous. Osmolite 1.5 @ 65 ml/hr x 18 hours, holding 1 hour before and 2 hours  after each dilantin  administration Patient not taking: Reported on 06/29/2023 06/09/23   Rai, Nydia POUR, MD  polyethylene glycol (MIRALAX  / GLYCOLAX ) 17 g packet Place 34 g into feeding tube daily. 06/09/23   Rai, Nydia POUR, MD  simethicone  (MYLICON) 80 MG chewable tablet Place 80 mg into feeding tube 3 (three) times daily.    [provider]  simvastatin  (ZOCOR ) 10 MG tablet Place 1 tablet (10 mg total) into feeding tube at bedtime. 02/22/22   Cheryle Page, MD  torsemide  (DEMADEX ) 10 MG tablet Give 10 mg per tube every 12 hours for edema, hold for a systolic reading less than 110    [provider]  valproic  acid (DEPAKENE ) 250 MG/5ML solution Place 500-750 mg into feeding tube See admin instructions. 500 mg via tube three times a day and 750 mg at bedtime    [provider]    Allergies: Tape, Demerol [meperidine hcl], Sulfonamide derivatives, Bacitracin-polymyxin b, and Oxycontin  [oxycodone ]    Review of Systems  Updated Vital Signs BP 101/72   Pulse 63   Temp 97.9 F (36.6 C) (Oral)   Resp 16   Ht 5' 10 (1.778 m)   Wt 97.3 kg   SpO2 100%   BMI 30.78 kg/m   Physical Exam Vitals and nursing note reviewed.  Constitutional:      Appearance: She is well-developed.  HENT:     Head: Normocephalic and atraumatic.  Cardiovascular:     Rate and Rhythm: Normal rate and regular rhythm.  Pulmonary:     Effort: No respiratory distress.     Breath sounds: No stridor.  Abdominal:     General: There is no distension.  Musculoskeletal:     Cervical back: Normal range of motion.  Neurological:     Mental Status: She is alert.     (all labs ordered are listed, but only abnormal results are displayed) Labs Reviewed  CBC WITH DIFFERENTIAL/PLATELET - Abnormal; Notable for the following components:      Result Value   Hemoglobin 15.8 (*)    HCT 46.8 (*)    MCV 107.6 (*)    MCH 36.3 (*)    Platelets 137 (*)    All other components within normal limits   CULTURE, BLOOD (ROUTINE X 2)  CULTURE, BLOOD (ROUTINE X 2)  LACTIC ACID, PLASMA  BASIC METABOLIC PANEL WITH GFR    EKG: None  Radiology: CT ABDOMEN PELVIS W CONTRAST Result Date: 03/24/2024 CLINICAL DATA:  Acute abdominal pain EXAM: CT ABDOMEN AND PELVIS WITH CONTRAST TECHNIQUE: Multidetector CT imaging of the abdomen and pelvis was performed using the standard protocol following bolus administration of intravenous contrast. RADIATION DOSE REDUCTION: This exam was performed according to the departmental dose-optimization program which includes automated exposure control, adjustment of the mA and/or kV according to patient size and/or use of iterative reconstruction technique. CONTRAST:  75mL OMNIPAQUE  IOHEXOL  350 MG/ML SOLN COMPARISON:  07/14/2023  FINDINGS: Lower chest: No acute abnormality. Hepatobiliary: No focal liver abnormality is seen. Status post cholecystectomy. No biliary dilatation. Pancreas: Unremarkable. No pancreatic ductal dilatation or surrounding inflammatory changes. Spleen: Normal in size without focal abnormality. Adrenals/Urinary Tract: Adrenal glands are within normal limits. Left renal cyst is noted simple in nature. No follow-up is recommended. Some patchy areas of decreased enhancement are noted within the right kidney suspicious for pyelonephritis. No obstructive changes are seen. The bladder is decompressed by Foley catheter. Stomach/Bowel: Jejunostomy tube is noted in place. Stomach is within normal limits. Visualized small bowel is unremarkable. Mild gaseous distension of the colon is seen without obstructive process. Postsurgical changes are noted in the right lower quadrant likely related to prior appendectomy. Vascular/Lymphatic: Aortic atherosclerosis. No enlarged abdominal or pelvic lymph nodes. Reproductive: Multiple calcified uterine fibroids are noted. Other: No abdominal wall hernia or abnormality. No abdominopelvic ascites. Musculoskeletal: Postsurgical changes in  the lower lumbar spine are noted. IMPRESSION: Patchy enhancement within the right kidney which persists on delayed images suspicious for pyelonephritis. Chronic changes as described above. Electronically Signed   By: Oneil Devonshire M.D.   On: 03/24/2024 01:51     Procedures   Medications Ordered in the ED - No data to display                                  Medical Decision Making Amount and/or Complexity of Data Reviewed Labs: ordered.   Patient appears well.  Vital signs are normal.  She is afebrile.  I suspect is likely contaminant will recheck blood cultures.  Will also check a CBC, lactic and if anything his change significantly we will consider admission otherwise she should build to go home on her current regimen pending repeat clean blood cultures. CBC and lactic reassuring, doubt sepsis. Will plan for discharge pending repeat culture results.      Final diagnoses:  None    ED Discharge Orders     None          Avid Guillette, Selinda, MD 04/03/24 2318

## 2024-03-25 NOTE — Progress Notes (Signed)
 Patient was transferred from ED via bed. Alert. Aphasia. With chronic foley catheter and PEG tube. On RA. Will continue to monitor through this shift

## 2024-03-25 NOTE — ED Provider Notes (Signed)
 [facility] Provider Note   CSN: [id number] Arrival date & time: 03/25/24  1136     Patient presents with: + blood cultures and AICD Problem   Kathleen Jensen is a 73 y.o. female.   HPI 73 year old female presents from her facility due to positive blood cultures.  She was seen here on 8/26, discharged in the early morning of 8/27.  Was given Rocephin  for pyelonephritis seen on CT.  Cultures came back positive and she was told to come back in and then was discharged earlier this morning.  However reportedly cardiology advised that she come back due to the positive cultures.  Patient is unable to speak at baseline and so the history is otherwise very limited.  Prior to Admission medications   Medication Sig Start Date End Date Taking? Authorizing Provider  acetaminophen  (TYLENOL ) 160 MG/5ML solution Place 20 mLs (640 mg total) into feeding tube every 8 (eight) hours. Patient taking differently: Place 800 mg into feeding tube every 6 (six) hours. 12/03/22   Elgergawy, Brayton RAMAN, MD  acetic acid  0.25 % irrigation 20cc irrigation twice a day, flush foley to prevent clogging 11/06/23   [provider]  amiodarone  (PACERONE ) 200 MG tablet Place 1 tablet (200 mg total) into feeding tube daily. 12/03/22   Elgergawy, Brayton RAMAN, MD  ascorbic acid  (VITAMIN C ) 500 MG tablet Place 0.5 tablets (250 mg total) into feeding tube 2 (two) times daily. 06/09/23   Rai, Nydia POUR, MD  aspirin  81 MG chewable tablet Place 81 mg into feeding tube at bedtime.    [provider]  cefpodoxime  (VANTIN ) 200 MG tablet Take 1 tablet (200 mg total) by mouth 2 (two) times daily. 03/24/24   Jerral Meth, MD  chlorhexidine  (PERIDEX ) 0.12 % solution Dab a toothbrush into 15 mLs and brush teeth 2 times a day    [provider]  Cranberry 425 MG CAPS Place 425 mg into feeding tube in the morning and at bedtime.    [provider]   cyclobenzaprine  (FLEXERIL ) 5 MG tablet Place 2.5 mg into feeding tube in the morning and at bedtime. Hold If Sedated    [provider]  diazePAM , 20 MG Dose, (VALTOCO  20 MG DOSE) 2 x 10 MG/0.1ML LQPK Place 20 mg into the nose daily as needed (seizures). Patient not taking: Reported on 03/24/2024    [provider]  docusate sodium  (COLACE) 100 MG capsule Give 1 capsule per tube twice a day    [provider]  estradiol (ESTRACE) 0.1 MG/GM vaginal cream Place vaginally See admin instructions. Apply a pea-sized amount to the urethra/vaginal wall on Tuesdays and Fridays    [provider]  famotidine  (PEPCID ) 40 MG/5ML suspension Place 40 mg into feeding tube daily. 03/25/22   [provider]  folic acid  (FOLVITE ) 800 MCG tablet Place 800 mcg into feeding tube daily.    [provider]  HYDROcodone -acetaminophen  (NORCO/VICODIN) 5-325 MG tablet Place 1 tablet into feeding tube 2 (two) times daily as needed for moderate pain (pain score 4-6).    [provider]  lactulose  (CHRONULAC ) 10 GM/15ML solution Take 20 g by mouth daily. 11/07/23   [provider]  levETIRAcetam  (KEPPRA ) 100 MG/ML solution Place 1,000 mg into feeding tube 2 (two) times daily.    [provider]  losartan  (COZAAR ) 25 MG tablet Place 1 tablet (25 mg total) into feeding tube daily. Patient taking differently: Place 25 mg into feeding tube  daily. Hold for SBP <110 03/06/23   Odell Celinda Balo, MD  Menthol , Topical Analgesic, (BIOFREEZE) 4 % GEL Apply 2 g topically See admin instructions. Apply 2 grams topically to all limbs 2 times a day    [provider]  metoprolol  tartrate (LOPRESSOR ) 25 MG tablet Place 1 tablet (25 mg total) into feeding tube 2 (two) times daily. 12/03/22   Elgergawy, Brayton RAMAN, MD  nutrition supplement, JUVEN, (JUVEN) PACK Place 1 packet into feeding tube 2 (two) times daily. Patient not taking: Reported on 06/29/2023 06/09/23    Rai, Nydia POUR, MD  Nutritional Supplements (FEEDING SUPPLEMENT, OSMOLITE 1.5 CAL,) LIQD Place 1,000 mLs into feeding tube continuous. Osmolite 1.5 @ 65 ml/hr x 18 hours, holding 1 hour before and 2 hours after each dilantin  administration Patient not taking: Reported on 06/29/2023 06/09/23   Rai, Nydia POUR, MD  polyethylene glycol (MIRALAX  / GLYCOLAX ) 17 g packet Place 34 g into feeding tube daily. 06/09/23   Rai, Nydia POUR, MD  simethicone  (MYLICON) 80 MG chewable tablet Place 80 mg into feeding tube 3 (three) times daily.    [provider]  simvastatin  (ZOCOR ) 10 MG tablet Place 1 tablet (10 mg total) into feeding tube at bedtime. 02/22/22   Cheryle Page, MD  torsemide  (DEMADEX ) 10 MG tablet Give 10 mg per tube every 12 hours for edema, hold for a systolic reading less than 110    [provider]  valproic  acid (DEPAKENE ) 250 MG/5ML solution Place 500-750 mg into feeding tube See admin instructions. 500 mg via tube three times a day and 750 mg at bedtime    [provider]    Allergies: Tape, Demerol [meperidine hcl], Sulfonamide derivatives, Bacitracin-polymyxin b, and Oxycontin  [oxycodone ]    Review of Systems  Unable to perform ROS: Patient nonverbal    Updated Vital Signs BP 103/65   Pulse 69   Temp 98 F (36.7 C) (Oral)   Resp 16   SpO2 100%   Physical Exam Vitals and nursing note reviewed.  Constitutional:      Appearance: She is well-developed.  HENT:     Head: Normocephalic and atraumatic.  Cardiovascular:     Rate and Rhythm: Normal rate and regular rhythm.     Heart sounds: Normal heart sounds.  Pulmonary:     Effort: Pulmonary effort is normal.     Breath sounds: Normal breath sounds.  Abdominal:     General: There is no distension.     Palpations: Abdomen is soft.     Comments: Small amount of discharge near her feeding tube.  No cellulitis.  Skin:    General: Skin is warm and dry.  Neurological:     Mental Status: She is  alert.     (all labs ordered are listed, but only abnormal results are displayed) Labs Reviewed - No data to display  EKG: None  Radiology: CT ABDOMEN PELVIS W CONTRAST Result Date: 03/24/2024 CLINICAL DATA:  Acute abdominal pain EXAM: CT ABDOMEN AND PELVIS WITH CONTRAST TECHNIQUE: Multidetector CT imaging of the abdomen and pelvis was performed using the standard protocol following bolus administration of intravenous contrast. RADIATION DOSE REDUCTION: This exam was performed according to the departmental dose-optimization program which includes automated exposure control, adjustment of the mA and/or kV according to patient size and/or use of iterative reconstruction technique. CONTRAST:  75mL OMNIPAQUE  IOHEXOL  350 MG/ML SOLN COMPARISON:  07/14/2023 FINDINGS: Lower chest: No acute abnormality. Hepatobiliary: No focal liver abnormality is seen. Status post cholecystectomy.  No biliary dilatation. Pancreas: Unremarkable. No pancreatic ductal dilatation or surrounding inflammatory changes. Spleen: Normal in size without focal abnormality. Adrenals/Urinary Tract: Adrenal glands are within normal limits. Left renal cyst is noted simple in nature. No follow-up is recommended. Some patchy areas of decreased enhancement are noted within the right kidney suspicious for pyelonephritis. No obstructive changes are seen. The bladder is decompressed by Foley catheter. Stomach/Bowel: Jejunostomy tube is noted in place. Stomach is within normal limits. Visualized small bowel is unremarkable. Mild gaseous distension of the colon is seen without obstructive process. Postsurgical changes are noted in the right lower quadrant likely related to prior appendectomy. Vascular/Lymphatic: Aortic atherosclerosis. No enlarged abdominal or pelvic lymph nodes. Reproductive: Multiple calcified uterine fibroids are noted. Other: No abdominal wall hernia or abnormality. No abdominopelvic ascites. Musculoskeletal: Postsurgical changes in  the lower lumbar spine are noted. IMPRESSION: Patchy enhancement within the right kidney which persists on delayed images suspicious for pyelonephritis. Chronic changes as described above. Electronically Signed   By: Oneil Devonshire M.D.   On: 03/24/2024 01:51     Procedures   Medications Ordered in the ED - No data to display                                  Medical Decision Making Amount and/or Complexity of Data Reviewed External Data Reviewed: notes.  Risk Prescription drug management. Decision regarding hospitalization.   I discussed with Dr. Overton of infectious disease.  He recommends the repeat blood cultures which were obtained a few hours ago and to continue Rocephin  and because of the methicillin resistance seen, start vancomycin .  Family practice consulted for admission.  Labs from a few hours ago unremarkable.  Is not hypotensive or septic.     Final diagnoses:  Bacteremia    ED Discharge Orders     None          Freddi Hamilton, MD 03/25/24 1249

## 2024-03-25 NOTE — Progress Notes (Signed)
  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   Discussed with ID and given + staph epi in 4/4, with the 2 sets being drawn several hours apart, unlikely to be contaminant.   Given PPM in situ this does warrant admission and ID consultation for full work up and r/o pacer infection.   Discussed with ED charge RN who will call SNF to have pt brought back to ED.   EP will follow along pending course and await TEE results.   Ozell Jodie Passey, PA-C  03/25/2024

## 2024-03-25 NOTE — ED Notes (Signed)
 Call for pt pick up at 7:20 PTAR

## 2024-03-25 NOTE — Assessment & Plan Note (Addendum)
 CVA hx/HLD: ASA 81 mg daily, simvastatin  10 mg daily Paroxysmal A-fib/s/p pacemaker: Continue Lopressor  25 mg twice daily, amiodarone  200 mg daily HTN- losartan  25 mg daily Peripheral edema: Torsemide  10 mg twice daily.  No reported history of CHF, per facility this is for edema. GERD- famotidine  40 mg daily Seizures- valproic  acid 500 mg 3 times daily with meals, 750 at bedtime.  Keppra  1000 mg twice daily.

## 2024-03-25 NOTE — Plan of Care (Signed)
 Received notification from Leontine Salen, [place] cardiology regarding TEE.  They request GI review and clearance prior to proceeding with TEE given G-tube dependence and prior imaging showing hiatal hernia and esophageal thickening.  We appreciate their assistance with this patient's care.  Spoke with Greig Corti, PA gastroenterology regarding clearance request.  GI will review chart.  They will either provide recommendations to the primary team, or see patient.  We appreciate their assistance with this patient's care.

## 2024-03-25 NOTE — ED Notes (Signed)
 Iv attempt unsuccessful

## 2024-03-25 NOTE — Progress Notes (Signed)
 Verbal order via secure chat from DO [facility] , was taken to use G tube for medication .

## 2024-03-25 NOTE — H&P (Addendum)
 Hospital Admission History and Physical Service Pager: [phone]  Patient name: Kathleen Jensen Medical record number: [id number] Date of Birth: 1951/03/02 Age: 73 y.o. Gender: female  Primary Care Provider: none Consultants: Infectious disease Code Status: Full which was confirmed by patient's social worker Preferred Emergency Contact: Powell Irving, legal guardian, [phone]  Chief Complaint: Bacteremia  Differential and Medical Decision Making:  Kathleen Jensen is a 73 y.o. female with chronic indwelling Foley, seizure disorder/prior CVA (baseline aphasic, oriented to self confirmed by skilled nursing facility nurse) presenting with bacteremia. At this time, appears to be staph epi methicillin-resistant-unclear source.  Reported sacral ulcer, will update photo when patient is moved from ED.  Additionally, CT findings suggestive of pyelonephritis, risk factor includes indwelling Foley catheter.  Reassuringly, patient is not septic and overall appears at baseline per SNF RN. Assessment & Plan Bacteremia Aerobic and anaerobic cultures x2 positive for staph epidermidis and methicillin resistance from ED visit 2 days ago. Cultures redrawn and sent.  ED provider spoke with infectious disease, who recommended full gram-positive bacteremia workup and continuing ceftriaxone /vancomycin . - Admit to [facility] TS, MedSurg, attending Dr. Donah - Rocephin  (8/28-), Vancomycin  (8/28-) - F/U cultures - F/U ID recs - Needs TEE, ID spoke with cards per note. Consent will need to be obtained by [name] Search. Pyelonephritis CT abdomen 8/27 showed patchy enhancement within the right kidney which persists on delayed images suspicious for pyelonephritis.  Urine culture on 8/26 growing providencia stuartii-pending sensitivities. - Antibiotics as above - Messaged nursing to exchange Foley catheter AKI (acute kidney injury) (HCC) Cr 1.07. Likely pre-renal (per nursing staff did not receive  feeds or flushes at facility, and patient in ED for nearly 24 hr period without her flushes). -Restart free water  flushes/tube feeds -daily BMP 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HCC) Located in epigastric region. History of chronic dysfunction and replacements by IR. - RD consult, appreciate recommendations - Per nursing facility, patient is n.p.o. all nutrition/meds through tube - Care precautions - Consult IR if tube needs replacement Sacral wound This is based on problem list, patient unable to verbalize.  Will obtain picture when patient moves out of the ED, and order wound care consult. Chronic health problem CVA hx/HLD: ASA 81 mg daily, simvastatin  10 mg daily Paroxysmal A-fib/s/p pacemaker: Continue Lopressor  25 mg twice daily, amiodarone  200 mg daily HTN- losartan  25 mg daily Peripheral edema: Torsemide  10 mg twice daily.  No reported history of CHF, per facility this is for edema. GERD- famotidine  40 mg daily Seizures- valproic  acid 500 mg 3 times daily with meals, 750 at bedtime.  Keppra  1000 mg twice daily.  FEN/GI: G tube feeds, consult RD VTE Prophylaxis: Lovenox   Disposition: Med surg  History of Present Illness:  Kathleen Jensen is a 73 y.o. female presenting with bacteremia found on blood cultures from ED visit 2 days ago. At that time, she was seen for Gtube-dysfunction, found to have pyelonephritis, given antibiotics and discharged. However, blood cultures returned positive, and patient returned to the ER this morning and then got discharged again, but cardiology advised patient to come back again due to positive cultures. ID was consulted by ED physician and was recommended to start patient on rocephin  and vancomycin  due to methicillin resistance seen. New cultures were drawn and sent.  In the ED, history is limited due to patient aphasia. She does not appear to be complaining of any pain. Called nurse at [facility] for further  history. 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 She has had multiple jejunostomy and GJ tube replacements by IR. She is on aggressive bowel regimen due to chronic ileus with abdominal distension, and she has a chronic foley.  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)    Hyperlipemia    Hypertension    Insomnia    Intracerebral hemorrhage, intraventricular (HCC)    Muscle weakness    Other abnormalities of gait and mobility    Seizures (HCC)    Stroke (HCC)    Unsteadiness on feet    Vitamin B deficiency    Pertinent Past Surgical History: Past Surgical History:  Procedure Laterality Date   BACK SURGERY     Dec 18, 2016 Dr.Torrealba   BREAST BIOPSY Right    BREAST EXCISIONAL BIOPSY     CERVICAL SPINE SURGERY     C4   CESAREAN SECTION     GASTRIC BYPASS  03/2007   IR GJ TUBE CHANGE  11/16/2023   IR REPLC DUODEN/JEJUNO TUBE PERCUT W/FLUORO  01/27/2023   IR REPLC DUODEN/JEJUNO TUBE PERCUT W/FLUORO  03/07/2023   IR REPLC DUODEN/JEJUNO TUBE PERCUT W/FLUORO  04/30/2023   IR REPLC DUODEN/JEJUNO TUBE PERCUT W/FLUORO  09/02/2023   IR REPLC DUODEN/JEJUNO TUBE PERCUT W/FLUORO  09/24/2023   TONSILLECTOMY     Pertinent Social History: Tobacco use: Yes/No/Former Alcohol use: Never Other Substance use: None Lives in nursing facility  Pertinent Family History: Family History  Problem Relation Age of Onset   High blood pressure Mother    Diabetes Mother    Diabetes Father    Stroke Father    Cancer Sister         Colorectal cancer   Cancer Brother        colon     Important Outpatient Medications: Current Meds  Medication Sig   acetaminophen  (TYLENOL ) 160 MG/5ML solution Place 20 mLs (640 mg total) into feeding tube every 8 (eight) hours. (Patient taking differently: Place 800 mg into feeding tube every 6 (six) hours.)   acetic acid  0.25 % irrigation 20cc irrigation twice a day, flush foley to prevent clogging   amiodarone  (PACERONE ) 200 MG tablet Place 1 tablet (200 mg total) into feeding tube daily.   ascorbic acid  (VITAMIN C ) 500 MG tablet Place 0.5 tablets (250 mg total) into feeding tube 2 (two) times daily.   aspirin  81 MG chewable tablet Place 81 mg into feeding tube at bedtime.   chlorhexidine  (PERIDEX ) 0.12 % solution Dab a toothbrush into 15 mLs and brush teeth 2 times a day   cyclobenzaprine  (FLEXERIL ) 5 MG tablet Place 2.5 mg into feeding tube in the morning and at bedtime. Hold If Sedated   diazePAM , 20 MG Dose, (VALTOCO  20 MG DOSE) 2 x 10 MG/0.1ML LQPK Place 20 mg into the nose daily as needed (seizures).   docusate sodium  (COLACE) 100 MG capsule Give 1 capsule per tube twice a day   estradiol (ESTRACE) 0.1 MG/GM vaginal cream Place vaginally See admin instructions. Apply a  pea-sized amount to the urethra/vaginal wall on Tuesdays and Fridays   famotidine  (PEPCID ) 40 MG/5ML suspension Place 40 mg into feeding tube daily.   folic acid  (FOLVITE ) 800 MCG tablet Place 800 mcg into feeding tube daily.   HYDROcodone -acetaminophen  (NORCO/VICODIN) 5-325 MG tablet Place 1 tablet into feeding tube 2 (two) times daily as needed for moderate pain (pain score 4-6).   lactulose  (CHRONULAC ) 10 GM/15ML solution Take 30 g by mouth daily.   levETIRAcetam  (KEPPRA ) 100 MG/ML solution Place 1,000 mg into feeding tube 2 (two) times daily.   losartan  (COZAAR ) 25 MG tablet Place 1 tablet (25 mg total) into feeding tube daily. (Patient taking differently: Place 25 mg into feeding tube daily. Hold for SBP  <110)   Menthol , Topical Analgesic, (BIOFREEZE) 4 % GEL Apply 2 g topically See admin instructions. Apply 2 grams topically to all limbs 2 times a day   metoprolol  tartrate (LOPRESSOR ) 25 MG tablet Place 1 tablet (25 mg total) into feeding tube 2 (two) times daily.   polyethylene glycol (MIRALAX  / GLYCOLAX ) 17 g packet Place 34 g into feeding tube daily.   simethicone  (MYLICON) 80 MG chewable tablet Place 80 mg into feeding tube 3 (three) times daily.   simvastatin  (ZOCOR ) 10 MG tablet Place 1 tablet (10 mg total) into feeding tube at bedtime.   torsemide  (DEMADEX ) 10 MG tablet Give 10 mg per tube every 12 hours for edema, hold for a systolic reading less than 110   valproic  acid (DEPAKENE ) 250 MG/5ML solution Place 500-750 mg into feeding tube See admin instructions. 500 mg via tube three times a day and 750 mg at bedtime   [DISCONTINUED] cefpodoxime  (VANTIN ) 200 MG tablet Take 1 tablet (200 mg total) by mouth 2 (two) times daily.    Objective: BP 120/67 (BP Location: Right Arm)   Pulse 72   Temp (!) 97.5 F (36.4 C) (Oral)   Resp 17   SpO2 99%   Exam: General: Laying in bed NAD Eyes: EOMI ENTM: Atraumatic, nasal septum non deviated Neck: Supple Cardiovascular: RRR, 2+ distal  pulses Respiratory: CTAB, breathing normally on room air Gastrointestinal: Distended, GJ tube in epigastric region MSK: No deformities, no edema Derm: No rashes Neuro: AxOx1 to self, right sided hemiparesis Psych: Normal mood, normal affect  Labs:  CBC BMET  Recent Labs  Lab 03/25/24 0512  WBC 5.2  HGB 15.8*  HCT 46.8*  PLT 137*   Recent Labs  Lab 03/25/24 0544  NA 138  K 3.8  CL 99  CO2 32  BUN 39*  CREATININE 1.03*  GLUCOSE 78  CALCIUM  8.0*    Pertinent additional labs Blood cultures with staph epidermidis in 4 bottles  Imaging Studies Performed: CT abdomen 03/24/24 Rads impression: Patchy enhancement within the right kidney which persists on delayed images suspicious for  pyelonephritis.  Wilburt Gwenn Bernida MARLA, Medical Student 03/25/2024, 2:15 PM AI, [facility]  FPTS Intern pager: [phone], text pages welcome Secure chat group [facility] Teaching Service  I was personally present and performed or re-performed the history, physical exam and medical decision making activities of this service and have verified that the service and findings are accurately documented in the student's note.  With the following addendum:  Updated contact information.  Patient is a ward of [facility].  Spoke with [name] Search, social worker for [facility] and assigned legal guardian.  Contact number [phone].  She reports that family does have contact with the patient, and  allowed to have contact with her.  All decisions will have to be made by [name] Search who is her legal guardian.  Reviewed advance care directives with [name], confirmed FULLCODE STATUS.  Called patient's nursing facility, confirmed medical history, medications, and events that led to her hospitalization.  Per facility nurse, patient is at baseline-she is only here because of blood cultures being positive.  Baseline is aphasic (occasional clarity of words), oriented to self.  Last Foley catheter change was earlier this month, and we will change this.  She does not wear CPAP at night.2.3  Gladis Church, DO                  03/25/2024, 3:22 PM

## 2024-03-25 NOTE — Progress Notes (Signed)
 Pharmacy Antibiotic Note  Kathleen Jensen is a 73 y.o. female admitted on 03/25/2024 with bacteremia.  Pharmacy has been consulted for vancomycin  dosing.   Plan: Vancomycin  1750mg  IV x1 then vancomycin  1250mg  IV q 12 (eAUC 537, Scr 1.03) Goal trough 15-29mcg/mL, Goal AUC 400-600 -F/u renal function, LOT, and culture data -F/u vancomycin  levels PRN per protocol     Temp (24hrs), Avg:97.8 F (36.6 C), Min:97.5 F (36.4 C), Max:98 F (36.7 C)  Recent Labs  Lab 03/23/24 2219 03/23/24 2322 03/25/24 0512 03/25/24 0544  WBC 9.2  --  5.2  --   CREATININE  --  0.87  --  1.03*  LATICACIDVEN  --   --  1.9  --     Estimated Creatinine Clearance: 61.4 mL/min (A) (by C-G formula based on SCr of 1.03 mg/dL (H)).    Allergies  Allergen Reactions   Tape Rash   Demerol [Meperidine Hcl] Nausea And Vomiting   Sulfonamide Derivatives Itching   Bacitracin-Polymyxin B Dermatitis    Not listed on the MAR   Oxycontin  [Oxycodone ] Itching    Antimicrobials this admission: Ceftriaxone  8/28 x1  Vanc 8/28>  Dose adjustments this admission:   Microbiology results: 8/26 UCX 8/27 BCX with 4/4 MRSE 8/28 BCX   Thank you for allowing pharmacy to be a part of this patient's care.  Sharyne Glatter, PharmD, BCCCP Critical Care Clinical Pharmacist 03/25/2024 12:34 PM

## 2024-03-25 NOTE — Assessment & Plan Note (Addendum)
 This is based on problem list, patient unable to verbalize.  Will obtain picture when patient moves out of the ED, and order wound care consult.

## 2024-03-25 NOTE — H&P (View-Only) (Signed)
 [facility]    Date of Admission:  03/25/2024     Reason for Consult: bacteremia in setting pacemaker presence    Referring Provider: Neal/autochamp     Lines:  Peripheral iv's  Abx: 8/28-c vanc 8/28-c ceftriaxone   8/27 cefpodoxime        Assessment: 73 yo female nursing home resident with htn, afib, asthma, dm2, osa, gerd, cva, seizure disorder, persistent right hemiparesis and aphasia, presence of g-tube, recalled 8/28 to [facility] for positive blood cultures mrse in setting of presence of pacemaker  8/26 [facility] culture in process 8/27 bcx 2 of 2 sets mrse (separated by several hours)  8/28 bcx in process    Unclear if she have pyelo or not (ct suggestive but unable to clarify sx). Ucx from 8/26 is still in process. When she originally came to ed 8/26 it was for pain around g-tube site and concern for g-tube dysfunction. Ucx and bcx obtained at that time  She is aphasic however and there is the ct finding and pyuria and already started on abx -- will finish 7 day   She is currently afebrile. No leukocytosis. Does have chronic mild thrombocytopenia  2 separate sets temporally with mrse and pacemaker does warrant r/o pacer infection    Plan: Can continue ceftriaxone  empirically for pyelo F/u repeat bcx result Tee - have asked cards to set up Can start vanc after repeat blood cx drawn Maintain standard isolation precaution Discussed with team     ------------------------------------------------ Active Problems:   * No active hospital problems. *    HPI: Kathleen Jensen is a 73 y.o. female nursing home resident with htn, afib, asthma, dm2, osa, gerd, cva, seizure disorder, persistent right hemiparesis and aphasia, presence of g-tube, recalled 8/28 to  for positive blood cultures mrse in setting of presence of pacemaker  Reviewed chart  Patient aphasic from prior stroke unable to give meaningful hx;  also mostly nonverbal   Patient came with g-tube site pain 8/26. Urine cx bcx checked. Sent back to snf with cefpodoxime . No sepsis  Recalled today due to bcx 2 set mrse  Afebrile  shakes her head when asked about any discomfort or anything different from baseline  No leukocytosis  Repeat bcx sent  Started on vanc    Family History  Problem Relation Age of Onset   High blood pressure Mother    Diabetes Mother    Diabetes Father    Stroke Father    Cancer Sister        Colorectal cancer   Cancer Brother        colon    Social History   Tobacco Use   Smoking status: Former    Current packs/day: 0.00    Types: Cigarettes    Start date: 07/30/1975    Quit date: 07/30/1979    Years since quitting: 44.6   Smokeless tobacco: Never  Vaping Use   Vaping status: Never Used  Substance Use Topics   Alcohol use: No    Alcohol/week: 0.0 standard drinks of alcohol   Drug use: No    Allergies  Allergen Reactions   Tape Rash   Demerol [Meperidine Hcl] Nausea And Vomiting   Sulfonamide Derivatives Itching   Bacitracin-Polymyxin B Dermatitis    Not listed on the MAR   Oxycontin  [Oxycodone ] Itching    Review of Systems: ROS All Other ROS was negative, except mentioned above   Past Medical  History:  Diagnosis Date   Aphasia    Arthritis    Asthma    Cardiac pacemaker    Cerebral amyloid angiopathy (CODE)    CKD (chronic kidney disease)    Cognitive communication deficit    Diabetes mellitus without complication (HCC)    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   OBJECTIVE: Blood pressure 103/65, pulse 69, temperature 98 F (36.7 C), temperature source Oral, resp. rate 16, SpO2 100%.  Physical Exam General/constitutional: no distress, minimal verbal (mostly sounds); seems appropriate/interactive HEENT: Normocephalic, PER, Conj Clear, EOMI, Oropharynx clear Neck supple CV: rrr no mrg; left chest pacer site prominance no tenderness/fluctuance/erythema Lungs: clear to auscultation, normal respiratory effort Abd: Soft, Nontender Ext: no edema Skin: No Rash Neuro: generalized weakness, expressive aphasia, left arm movement 3-4/5; right sided hemiplegia, left leg weakness 2-3/5 MSK: no peripheral joint swelling/tenderness/warmth; back spines nontender    Lab Results Lab Results  Component Value Date   WBC 5.2 03/25/2024   HGB 15.8 (H) 03/25/2024   HCT 46.8 (H) 03/25/2024   MCV 107.6 (H) 03/25/2024   PLT 137 (L) 03/25/2024    Lab Results  Component Value Date   CREATININE 1.03 (H) 03/25/2024   BUN 39 (H) 03/25/2024   NA 138 03/25/2024   K 3.8 03/25/2024   CL 99 03/25/2024   CO2 32 03/25/2024    Lab Results  Component Value Date   ALT 22 03/23/2024   AST 49 (H) 03/23/2024   ALKPHOS 141 (H) 03/23/2024   BILITOT 0.5 03/23/2024      Microbiology: Recent Results (from the past 240 hours)  Blood culture (routine x 2)     Status: None (Preliminary result)   Collection Time: 03/24/24  5:15 AM   Specimen: BLOOD  Result Value Ref Range Status   Specimen Description BLOOD LEFT ANTECUBITAL  Final   Special Requests   Final    BOTTLES DRAWN AEROBIC AND ANAEROBIC Blood Culture adequate volume   Culture  Setup Time   Final    GRAM POSITIVE COCCI IN CLUSTERS IN BOTH AEROBIC AND ANAEROBIC BOTTLES CRITICAL VALUE NOTED.  VALUE IS CONSISTENT WITH PREVIOUSLY REPORTED AND CALLED VALUE. Performed at [facility], [address],  [place], [place] [address]    Culture GRAM POSITIVE COCCI  Final   Report Status PENDING  Incomplete  Blood culture (routine x 2)     Status: None (Preliminary result)   Collection Time: 03/24/24  5:25 AM   Specimen: BLOOD RIGHT HAND  Result Value Ref Range Status   Specimen Description BLOOD RIGHT HAND  Final   Special Requests   Final    BOTTLES DRAWN AEROBIC AND ANAEROBIC Blood Culture results may not be optimal due to an inadequate volume of blood received in culture bottles   Culture  Setup Time   Final    GRAM POSITIVE COCCI IN CLUSTERS IN BOTH AEROBIC AND ANAEROBIC BOTTLES CRITICAL RESULT  CALLED TO, READ BACK BY AND VERIFIED WITH: RN LAYMON KIDD [phone] [id number] FCP    Culture   Final    GRAM POSITIVE COCCI CULTURE REINCUBATED FOR BETTER GROWTH Performed at [facility], [address], [place], [place] [address]    Report Status PENDING  Incomplete  Blood Culture ID Panel (Reflexed)     Status: Abnormal   Collection Time: 03/24/24  5:25 AM  Result Value Ref Range Status   Enterococcus faecalis NOT DETECTED NOT DETECTED Final   Enterococcus Faecium NOT DETECTED NOT DETECTED Final   Listeria monocytogenes NOT DETECTED NOT DETECTED Final   Staphylococcus species DETECTED (A) NOT DETECTED Final    Comment: CRITICAL RESULT CALLED TO, READ BACK BY AND VERIFIED WITH: RN BRITTANY O [phone] [id number] FCP    Staphylococcus aureus (BCID) NOT DETECTED NOT DETECTED Final   Staphylococcus epidermidis DETECTED (A) NOT DETECTED Final    Comment: Methicillin (oxacillin) resistant coagulase negative staphylococcus. Possible blood culture contaminant (unless isolated from more than one blood culture draw or clinical case suggests pathogenicity). No antibiotic treatment is indicated for blood  culture contaminants. CRITICAL RESULT CALLED TO, READ BACK BY AND VERIFIED WITH: RN LAYMON KIDD [phone] [id number] FCP    Staphylococcus lugdunensis NOT DETECTED NOT DETECTED Final   Streptococcus species NOT DETECTED  NOT DETECTED Final   Streptococcus agalactiae NOT DETECTED NOT DETECTED Final   Streptococcus pneumoniae NOT DETECTED NOT DETECTED Final   Streptococcus pyogenes NOT DETECTED NOT DETECTED Final   A.calcoaceticus-baumannii NOT DETECTED NOT DETECTED Final   Bacteroides fragilis NOT DETECTED NOT DETECTED Final   Enterobacterales NOT DETECTED NOT DETECTED Final   Enterobacter cloacae complex NOT DETECTED NOT DETECTED Final   Escherichia coli NOT DETECTED NOT DETECTED Final   Klebsiella aerogenes NOT DETECTED NOT DETECTED Final   Klebsiella oxytoca NOT DETECTED NOT DETECTED Final   Klebsiella pneumoniae NOT DETECTED NOT DETECTED Final   Proteus species NOT DETECTED NOT DETECTED Final   Salmonella species NOT DETECTED NOT DETECTED Final   Serratia marcescens NOT DETECTED NOT DETECTED Final   Haemophilus influenzae NOT DETECTED NOT DETECTED Final   Neisseria meningitidis NOT DETECTED NOT DETECTED Final   Pseudomonas aeruginosa NOT DETECTED NOT DETECTED Final   Stenotrophomonas maltophilia NOT DETECTED NOT DETECTED Final   Candida albicans NOT DETECTED NOT DETECTED Final   Candida auris NOT DETECTED NOT DETECTED Final   Candida glabrata NOT DETECTED NOT DETECTED Final   Candida krusei NOT DETECTED NOT DETECTED Final   Candida parapsilosis NOT DETECTED NOT DETECTED Final   Candida tropicalis NOT DETECTED NOT DETECTED Final   Cryptococcus neoformans/gattii NOT DETECTED NOT DETECTED Final   Methicillin resistance mecA/C DETECTED (A) NOT DETECTED Final    Comment: CRITICAL RESULT CALLED TO, READ BACK BY AND VERIFIED WITH: RN LAYMON KIDD SANDERSON [id number] FCP Performed at [facility], [address] [place], [place] [address]      Serology:    Imaging: If present, new imagings (plain films, ct scans, and mri) have been personally visualized and interpreted; radiology reports have been reviewed. Decision making incorporated into the Impression / Recommendations.  8/27 ct abd pelv with  contrast Patchy enhancement within the right kidney which persists on delayed images suspicious for pyelonephritis.   Chronic changes as described above.  Kathleen ONEIDA Passer, MD [facility] [phone] pager    03/25/2024, 12:13 PM

## 2024-03-25 NOTE — Discharge Instructions (Signed)
 I suspect the abnormal culture is a contaminant, she has no evidence of bacterial blood infection, please return if symptoms worsen. New cultures have been sent. We will call again if these are positive, but it is unlikely.

## 2024-03-25 NOTE — Plan of Care (Signed)
 FMTS Brief Progress Note  S:Night round on patient. Difficult to understand secondary to aphasia after previous stroke. Appear comfortable. Follows some commands.   O: BP 122/64 (BP Location: Right Arm)   Pulse 65   Temp 98 F (36.7 C) (Oral)   Resp 16   Ht 5' 10 (1.778 m)   SpO2 97%   BMI 30.78 kg/m   General: NAD, lying comfortably in hospital bed Neuro: A&O  Respiratory: normal WOB on RA Abdomen: soft, distended, G-tube in place, NTTP, no rebound or guarding Extremities: Moving all 4 extremities equally Sacrum:   A/P: Bacteremia Unclear source, staph epidermidis would be very atypical  urinary source. Potential secondary to chronic sacral wound, although there is no sign of acute infection on exam. Stage 2-3. Can consider sacral mri; however, if osteo likely chronic not acute. -Wound care consulted -Continue Vanc, ceftriaxone  -ID recs appreciated -Repeat cultures pending  Alba Sharper, MD 03/25/2024, 8:16 PM PGY-3, [facility] Night Resident  Please page [phone] with questions.

## 2024-03-25 NOTE — Hospital Course (Addendum)
 Kathleen Jensen is a 73 y.o.female with a history of chronic indwelling foley, chronic PEG tube, seizure disorder, prior CVA (baseline aphasic, oriented to self only), persistent R hemiparesis, [facility] who was admitted to the [facility] for bacteremia. Her hospital course is detailed below:  Bacteremia Endocarditis Admitted for MRSE bacteremia in setting of pacemaker device. Repeat BC grew 1 site x2 for coag negative staph. Urine cultures grew proteus (chronic indwelling foley). Started on Ceftriaxone  (8/28), Vancomycin  (8/28) transitioned to IV cefazolin , to complete 6 week course outpatient. TEE delayed due to consent issues, done on 9/4 and demonstrated fibrinous strands of both RA and RV leads, not surgical candidate and will receive lifelong antibiotic treatment of cefadroxil once finished with IV antibiotics outpatient. PICC line placed 9/8*** and was stable for discharge.  Pyelonephritis Seen in ED 8/26 for issues with PEG tube, had CT abdomen showing possible pyelonephritis, discharged with antibiotics. Blood cultures were drawn, and she was instructed to come back to the hospital due to MRSE. Completed 5 day course of CTX. Foley last exchanged on 8/28, chronic indwelling cather. No urinary symptoms during hospitalization or fever.  AKI Suspect prerenal due to no free water  tube flushes while in ED. Restarted free water  flushes and Creatinine improved to normal limits.    PEG tube malfunction Intermittent clogging of GJ tube per nursing, valve was clogged and exchanged but tube was leaking on exam. IR replaced 18 french GJ tube under fluoroscopy 9/5.  Other chronic conditions were medically managed with home medications and formulary alternatives as necessary   Recommendations for NH: Recommend specific instructions on DC to not use rectal pouch this way ( stuffed into rectum) ; perhaps  they need to have training in how to apply rectal  pouch if they use one, or other options. . Have added MVI, Zinc  and vitamin C  to help with wound healing and would recommend continue on discharge.   PCP Follow-up Recommendations: Continue 6 week course of IV Cefazolin  2g every 8 hours until 10/16 Will need lifelong antibiotics (Cefadroxil) following cefazolin  treatment. Tube feed follow up and nutrition recs, exchange in 6 months per IR. Exchange chronic indwelling catheter 9/25-10/9 Consider further work with speech therapy for communication board given patient's aphasia; SLP did not feel there were significant intervention to be done inpatient

## 2024-03-25 NOTE — Assessment & Plan Note (Addendum)
 Aerobic and anaerobic cultures x2 positive for staph epidermidis and methicillin resistance from ED visit 2 days ago. Cultures redrawn and sent.  ED provider spoke with infectious disease, who recommended full gram-positive bacteremia workup and continuing ceftriaxone /vancomycin . - Admit to FM TS, MedSurg, attending Dr. Donah - Rocephin  (8/28-), Vancomycin  (8/28-) - F/U cultures - F/U ID recs - Needs TEE, ID spoke with cards per note. Consent will need to be obtained by [name].

## 2024-03-25 NOTE — ED Triage Notes (Signed)
 Patient bib EMS from [facility]. Patient was discharged from ED this morning but was called to come back after blood culture results were positive. EMS reports patient also needs her pacemaker checked. Patient is aphasic at baseline and has a foley and peg tube.

## 2024-03-25 NOTE — Consult Note (Signed)
 [facility]    Date of Admission:  03/25/2024     Reason for Consult: bacteremia in setting pacemaker presence    Referring Provider: Neal/autochamp     Lines:  Peripheral iv's  Abx: 8/28-c vanc 8/28-c ceftriaxone   8/27 cefpodoxime        Assessment: 73 yo female nursing home resident with htn, afib, asthma, dm2, osa, gerd, cva, seizure disorder, persistent right hemiparesis and aphasia, presence of g-tube, recalled 8/28 to [facility] for positive blood cultures mrse in setting of presence of pacemaker  8/26 [facility] culture in process 8/27 bcx 2 of 2 sets mrse (separated by several hours)  8/28 bcx in process    Unclear if she have pyelo or not (ct suggestive but unable to clarify sx). Ucx from 8/26 is still in process. When she originally came to ed 8/26 it was for pain around g-tube site and concern for g-tube dysfunction. Ucx and bcx obtained at that time  She is aphasic however and there is the ct finding and pyuria and already started on abx -- will finish 7 day   She is currently afebrile. No leukocytosis. Does have chronic mild thrombocytopenia  2 separate sets temporally with mrse and pacemaker does warrant r/o pacer infection    Plan: Can continue ceftriaxone  empirically for pyelo F/u repeat bcx result Tee - have asked cards to set up Can start vanc after repeat blood cx drawn Maintain standard isolation precaution Discussed with team     ------------------------------------------------ Active Problems:   * No active hospital problems. *    HPI: Kathleen Jensen is a 73 y.o. female nursing home resident with htn, afib, asthma, dm2, osa, gerd, cva, seizure disorder, persistent right hemiparesis and aphasia, presence of g-tube, recalled 8/28 to  for positive blood cultures mrse in setting of presence of pacemaker  Reviewed chart  Patient aphasic from prior stroke unable to give meaningful hx;  also mostly nonverbal   Patient came with g-tube site pain 8/26. Urine cx bcx checked. Sent back to snf with cefpodoxime . No sepsis  Recalled today due to bcx 2 set mrse  Afebrile  shakes her head when asked about any discomfort or anything different from baseline  No leukocytosis  Repeat bcx sent  Started on vanc    Family History  Problem Relation Age of Onset   High blood pressure Mother    Diabetes Mother    Diabetes Father    Stroke Father    Cancer Sister        Colorectal cancer   Cancer Brother        colon    Social History   Tobacco Use   Smoking status: Former    Current packs/day: 0.00    Types: Cigarettes    Start date: 07/30/1975    Quit date: 07/30/1979    Years since quitting: 44.6   Smokeless tobacco: Never  Vaping Use   Vaping status: Never Used  Substance Use Topics   Alcohol use: No    Alcohol/week: 0.0 standard drinks of alcohol   Drug use: No    Allergies  Allergen Reactions   Tape Rash   Demerol [Meperidine Hcl] Nausea And Vomiting   Sulfonamide Derivatives Itching   Bacitracin-Polymyxin B Dermatitis    Not listed on the MAR   Oxycontin  [Oxycodone ] Itching    Review of Systems: ROS All Other ROS was negative, except mentioned above   Past Medical  History:  Diagnosis Date   Aphasia    Arthritis    Asthma    Cardiac pacemaker    Cerebral amyloid angiopathy (CODE)    CKD (chronic kidney disease)    Cognitive communication deficit    Diabetes mellitus without complication (HCC)    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   OBJECTIVE: Blood pressure 103/65, pulse 69, temperature 98 F (36.7 C), temperature source Oral, resp. rate 16, SpO2 100%.  Physical Exam General/constitutional: no distress, minimal verbal (mostly sounds); seems appropriate/interactive HEENT: Normocephalic, PER, Conj Clear, EOMI, Oropharynx clear Neck supple CV: rrr no mrg; left chest pacer site prominance no tenderness/fluctuance/erythema Lungs: clear to auscultation, normal respiratory effort Abd: Soft, Nontender Ext: no edema Skin: No Rash Neuro: generalized weakness, expressive aphasia, left arm movement 3-4/5; right sided hemiplegia, left leg weakness 2-3/5 MSK: no peripheral joint swelling/tenderness/warmth; back spines nontender    Lab Results Lab Results  Component Value Date   WBC 5.2 03/25/2024   HGB 15.8 (H) 03/25/2024   HCT 46.8 (H) 03/25/2024   MCV 107.6 (H) 03/25/2024   PLT 137 (L) 03/25/2024    Lab Results  Component Value Date   CREATININE 1.03 (H) 03/25/2024   BUN 39 (H) 03/25/2024   NA 138 03/25/2024   K 3.8 03/25/2024   CL 99 03/25/2024   CO2 32 03/25/2024    Lab Results  Component Value Date   ALT 22 03/23/2024   AST 49 (H) 03/23/2024   ALKPHOS 141 (H) 03/23/2024   BILITOT 0.5 03/23/2024      Microbiology: Recent Results (from the past 240 hours)  Blood culture (routine x 2)     Status: None (Preliminary result)   Collection Time: 03/24/24  5:15 AM   Specimen: BLOOD  Result Value Ref Range Status   Specimen Description BLOOD LEFT ANTECUBITAL  Final   Special Requests   Final    BOTTLES DRAWN AEROBIC AND ANAEROBIC Blood Culture adequate volume   Culture  Setup Time   Final    GRAM POSITIVE COCCI IN CLUSTERS IN BOTH AEROBIC AND ANAEROBIC BOTTLES CRITICAL VALUE NOTED.  VALUE IS CONSISTENT WITH PREVIOUSLY REPORTED AND CALLED VALUE. Performed at [facility], [address],  [place], [place] [address]    Culture GRAM POSITIVE COCCI  Final   Report Status PENDING  Incomplete  Blood culture (routine x 2)     Status: None (Preliminary result)   Collection Time: 03/24/24  5:25 AM   Specimen: BLOOD RIGHT HAND  Result Value Ref Range Status   Specimen Description BLOOD RIGHT HAND  Final   Special Requests   Final    BOTTLES DRAWN AEROBIC AND ANAEROBIC Blood Culture results may not be optimal due to an inadequate volume of blood received in culture bottles   Culture  Setup Time   Final    GRAM POSITIVE COCCI IN CLUSTERS IN BOTH AEROBIC AND ANAEROBIC BOTTLES CRITICAL RESULT  CALLED TO, READ BACK BY AND VERIFIED WITH: RN LAYMON KIDD [phone] [id number] FCP    Culture   Final    GRAM POSITIVE COCCI CULTURE REINCUBATED FOR BETTER GROWTH Performed at [facility], [address], [place], [place] [address]    Report Status PENDING  Incomplete  Blood Culture ID Panel (Reflexed)     Status: Abnormal   Collection Time: 03/24/24  5:25 AM  Result Value Ref Range Status   Enterococcus faecalis NOT DETECTED NOT DETECTED Final   Enterococcus Faecium NOT DETECTED NOT DETECTED Final   Listeria monocytogenes NOT DETECTED NOT DETECTED Final   Staphylococcus species DETECTED (A) NOT DETECTED Final    Comment: CRITICAL RESULT CALLED TO, READ BACK BY AND VERIFIED WITH: RN BRITTANY O [phone] [id number] FCP    Staphylococcus aureus (BCID) NOT DETECTED NOT DETECTED Final   Staphylococcus epidermidis DETECTED (A) NOT DETECTED Final    Comment: Methicillin (oxacillin) resistant coagulase negative staphylococcus. Possible blood culture contaminant (unless isolated from more than one blood culture draw or clinical case suggests pathogenicity). No antibiotic treatment is indicated for blood  culture contaminants. CRITICAL RESULT CALLED TO, READ BACK BY AND VERIFIED WITH: RN LAYMON KIDD [phone] [id number] FCP    Staphylococcus lugdunensis NOT DETECTED NOT DETECTED Final   Streptococcus species NOT DETECTED  NOT DETECTED Final   Streptococcus agalactiae NOT DETECTED NOT DETECTED Final   Streptococcus pneumoniae NOT DETECTED NOT DETECTED Final   Streptococcus pyogenes NOT DETECTED NOT DETECTED Final   A.calcoaceticus-baumannii NOT DETECTED NOT DETECTED Final   Bacteroides fragilis NOT DETECTED NOT DETECTED Final   Enterobacterales NOT DETECTED NOT DETECTED Final   Enterobacter cloacae complex NOT DETECTED NOT DETECTED Final   Escherichia coli NOT DETECTED NOT DETECTED Final   Klebsiella aerogenes NOT DETECTED NOT DETECTED Final   Klebsiella oxytoca NOT DETECTED NOT DETECTED Final   Klebsiella pneumoniae NOT DETECTED NOT DETECTED Final   Proteus species NOT DETECTED NOT DETECTED Final   Salmonella species NOT DETECTED NOT DETECTED Final   Serratia marcescens NOT DETECTED NOT DETECTED Final   Haemophilus influenzae NOT DETECTED NOT DETECTED Final   Neisseria meningitidis NOT DETECTED NOT DETECTED Final   Pseudomonas aeruginosa NOT DETECTED NOT DETECTED Final   Stenotrophomonas maltophilia NOT DETECTED NOT DETECTED Final   Candida albicans NOT DETECTED NOT DETECTED Final   Candida auris NOT DETECTED NOT DETECTED Final   Candida glabrata NOT DETECTED NOT DETECTED Final   Candida krusei NOT DETECTED NOT DETECTED Final   Candida parapsilosis NOT DETECTED NOT DETECTED Final   Candida tropicalis NOT DETECTED NOT DETECTED Final   Cryptococcus neoformans/gattii NOT DETECTED NOT DETECTED Final   Methicillin resistance mecA/C DETECTED (A) NOT DETECTED Final    Comment: CRITICAL RESULT CALLED TO, READ BACK BY AND VERIFIED WITH: RN LAYMON KIDD SANDERSON [id number] FCP Performed at [facility], [address] [place], [place] [address]      Serology:    Imaging: If present, new imagings (plain films, ct scans, and mri) have been personally visualized and interpreted; radiology reports have been reviewed. Decision making incorporated into the Impression / Recommendations.  8/27 ct abd pelv with  contrast Patchy enhancement within the right kidney which persists on delayed images suspicious for pyelonephritis.   Chronic changes as described above.  Constance ONEIDA Passer, MD [facility] [phone] pager    03/25/2024, 12:13 PM

## 2024-03-25 NOTE — ED Notes (Signed)
 Attempted calling SNF multiple times with no answer. Attempted to call listed guardian, who is with state, that did not answer.

## 2024-03-26 DIAGNOSIS — Z01818 Encounter for other preprocedural examination: Secondary | ICD-10-CM | POA: Diagnosis not present

## 2024-03-26 DIAGNOSIS — Z931 Gastrostomy status: Secondary | ICD-10-CM | POA: Diagnosis not present

## 2024-03-26 DIAGNOSIS — N179 Acute kidney failure, unspecified: Secondary | ICD-10-CM | POA: Diagnosis not present

## 2024-03-26 DIAGNOSIS — R7881 Bacteremia: Secondary | ICD-10-CM | POA: Diagnosis not present

## 2024-03-26 DIAGNOSIS — E876 Hypokalemia: Secondary | ICD-10-CM

## 2024-03-26 LAB — GLUCOSE, CAPILLARY
Glucose-Capillary: 126 mg/dL — ABNORMAL HIGH (ref 70–99)
Glucose-Capillary: 31 mg/dL — CL (ref 70–99)
Glucose-Capillary: 53 mg/dL — ABNORMAL LOW (ref 70–99)
Glucose-Capillary: 62 mg/dL — ABNORMAL LOW (ref 70–99)
Glucose-Capillary: 76 mg/dL (ref 70–99)
Glucose-Capillary: 77 mg/dL (ref 70–99)
Glucose-Capillary: 87 mg/dL (ref 70–99)
Glucose-Capillary: 94 mg/dL (ref 70–99)

## 2024-03-26 LAB — CBC
HCT: 38.3 % (ref 36.0–46.0)
Hemoglobin: 12.7 g/dL (ref 12.0–15.0)
MCH: 36.2 pg — ABNORMAL HIGH (ref 26.0–34.0)
MCHC: 33.2 g/dL (ref 30.0–36.0)
MCV: 109.1 fL — ABNORMAL HIGH (ref 80.0–100.0)
Platelets: 90 K/uL — ABNORMAL LOW (ref 150–400)
RBC: 3.51 MIL/uL — ABNORMAL LOW (ref 3.87–5.11)
RDW: 12.1 % (ref 11.5–15.5)
WBC: 6.7 K/uL (ref 4.0–10.5)
nRBC: 0 % (ref 0.0–0.2)

## 2024-03-26 LAB — URINE CULTURE: Culture: >=100000 — AB

## 2024-03-26 LAB — BASIC METABOLIC PANEL WITH GFR
Anion gap: 11 (ref 5–15)
BUN: 28 mg/dL — ABNORMAL HIGH (ref 8–23)
CO2: 27 mmol/L (ref 22–32)
Calcium: 7.8 mg/dL — ABNORMAL LOW (ref 8.9–10.3)
Chloride: 100 mmol/L (ref 98–111)
Creatinine, Ser: 0.93 mg/dL (ref 0.44–1.00)
GFR, Estimated: >60 mL/min (ref >60)
Glucose, Bld: 83 mg/dL (ref 70–99)
Potassium: 3.3 mmol/L — ABNORMAL LOW (ref 3.5–5.1)
Sodium: 138 mmol/L (ref 135–145)

## 2024-03-26 MED ORDER — DEXTROSE 50 % IV SOLN
50.0000 mL | INTRAVENOUS | Status: DC | PRN
  Administered 2024-03-26 – 2024-03-31 (×4): 50 mL via INTRAVENOUS
  Filled 2024-03-26 (×3): qty 50

## 2024-03-26 MED ORDER — PROSOURCE TF20 ENFIT COMPATIBL EN LIQD
60.0000 mL | Freq: Two times a day (BID) | ENTERAL | Status: DC
  Administered 2024-03-26 – 2024-04-06 (×21): 60 mL
  Filled 2024-03-26 (×26): qty 60

## 2024-03-26 MED ORDER — DEXTROSE 50 % IV SOLN: 1.0000 | Freq: Once | INTRAVENOUS | Status: DC

## 2024-03-26 MED ORDER — DEXTROSE 50 % IV SOLN
INTRAVENOUS | Status: AC
  Filled 2024-03-26: qty 50

## 2024-03-26 MED ORDER — DEXTROSE IN LACTATED RINGERS 5 % IV SOLN: INTRAVENOUS | Status: AC

## 2024-03-26 MED ORDER — DEXTROSE 50 % IV SOLN
25.0000 g | INTRAVENOUS | Status: AC
  Administered 2024-03-26: 25 g via INTRAVENOUS
  Filled 2024-03-26: qty 50

## 2024-03-26 MED ORDER — OSMOLITE 1.5 CAL PO LIQD
1000.0000 mL | ORAL | Status: DC
  Administered 2024-03-26 – 2024-04-06 (×11): 1000 mL
  Filled 2024-03-26 (×12): qty 1000

## 2024-03-26 MED ORDER — DEXTROSE 50 % IV SOLN: 1.0000 | Freq: Once | INTRAVENOUS | Status: AC

## 2024-03-26 MED ORDER — DEXTROSE 50 % IV SOLN: 50.0000 mL | Freq: Once | INTRAVENOUS | Status: DC

## 2024-03-26 MED ORDER — OSMOLITE 1.5 CAL PO LIQD
1000.0000 mL | ORAL | Status: DC
  Administered 2024-03-26 – 2024-04-06 (×9): 1000 mL
  Filled 2024-03-26 (×12): qty 1000

## 2024-03-26 MED ORDER — DEXTROSE 50 % IV SOLN
INTRAVENOUS | Status: AC
  Administered 2024-03-26: 50 mL via INTRAVENOUS
  Filled 2024-03-26: qty 50

## 2024-03-26 MED ORDER — POTASSIUM CHLORIDE 20 MEQ PO PACK
40.0000 meq | PACK | Freq: Once | ORAL | Status: AC
  Administered 2024-03-26: 40 meq
  Filled 2024-03-26: qty 2

## 2024-03-26 MED ORDER — ENOXAPARIN SODIUM 60 MG/0.6ML IJ SOSY
50.0000 mg | PREFILLED_SYRINGE | INTRAMUSCULAR | Status: DC
  Administered 2024-03-26 – 2024-04-06 (×12): 50 mg via SUBCUTANEOUS
  Filled 2024-03-26 (×12): qty 0.6

## 2024-03-26 MED ORDER — ADULT MULTIVITAMIN W/MINERALS CH
1.0000 | ORAL_TABLET | Freq: Every day | ORAL | Status: DC
  Administered 2024-03-26 – 2024-04-06 (×11): 1
  Filled 2024-03-26 (×11): qty 1

## 2024-03-26 NOTE — Progress Notes (Signed)
 Initial Nutrition Assessment  DOCUMENTATION CODES:  Not applicable  INTERVENTION:  Tube feeds via J-tube: Osmolite 1.5 at 65 mL/hr x 18 hours Hold 1 hours before and 2 hours after each dilantin  administration 60 mL ProSource TF20 - BID Free water  flush: 150 mL every 4 hours  Regimen at goal provides 1915 calories, 113 grams protein, and 1791 mL free water  daily.   Multivitamin w/ minerals daily via tube  NUTRITION DIAGNOSIS:  Inadequate oral intake related to inability to eat as evidenced by NPO status.  GOAL:  Patient will meet greater than or equal to 90% of their needs  MONITOR:  Labs, I & O's, Skin, TF tolerance  REASON FOR ASSESSMENT:  Consult Enteral/tube feeding initiation and management  ASSESSMENT:  73 y.o. female presented to the ED with bacteremia, presented to the ED two days prior with G-tube dysfunction. PMH includes  GERD, HTN, HLD, T2DM, CVA, s/p gastric bypass, dysphagia s/p J-tube, and aphasia. Pt admitted with bacteremia after cultures showed growth, AKI, and pyelonephritis.   Pt laying in bed, RN at bedside administering medications. Pt aphasic and no family at bedside; unable to obtain any nutrition related history. Per chart review, pt receives Osmolite 1.5 at 65 mL/hr x 18 hours per day, holding for 6 hours around medication administration.  RD to adjust TF regimen to match home regimen.   Per chart review, pt with weight gain over the past year. Physical exam limited due to pt slight contractors and   Nutrition Related Medications: Pepcid , Lactulose , Miralax , Mylicon, Torsemide , IV antibiotics  Labs: Sodium 138, Potassium 3.3, BUN 28, Creatinine 0.93  CBG: 53-162 mg/dL x 24 hrs   NUTRITION - FOCUSED PHYSICAL EXAM:  Flowsheet Row Most Recent Value  Orbital Region No depletion  Upper Arm Region No depletion  Thoracic and Lumbar Region No depletion  Buccal Region No depletion  Temple Region No depletion  Clavicle Bone Region Mild depletion   Clavicle and Acromion Bone Region Mild depletion  Scapular Bone Region Unable to assess  Dorsal Hand Unable to assess  Patellar Region Unable to assess  Anterior Thigh Region Unable to assess  Posterior Calf Region Mild depletion  Edema (RD Assessment) Mild  Hair Reviewed  Eyes Reviewed  Mouth Reviewed  Skin Reviewed  Nails Reviewed    Diet Order:   Diet Order             Diet NPO time specified  Diet effective now                  EDUCATION NEEDS:  Not appropriate for education at this time  Skin:  Skin Assessment: Skin Integrity Issues: Skin Integrity Issues:: DTI DTI: sacrum  Last BM:  8/28  Height:  Ht Readings from Last 1 Encounters:  03/25/24 5' 10 (1.778 m)   Weight:  Wt Readings from Last 1 Encounters:  03/26/24 102.5 kg   Ideal Body Weight:  68.2 kg  BMI:  Body mass index is 32.42 kg/m.  Estimated Nutritional Needs:  Kcal:  1800-2000 Protein:  90-110 grams Fluid:  >/= 1.8 L   Nestora Glatter RD, LDN Clinical Dietitian

## 2024-03-26 NOTE — TOC Progression Note (Signed)
 Transition of Care [facility]) - Progression Note    Patient Details  Name: Kathleen Jensen MRN: [id number] Date of Birth: 06-21-1951  [facility] CM/SW Contact  Lauraine FORBES Saa, LCSWA Phone Number: 03/26/2024, 2:35 PM  Clinical Narrative:     2:35 PM Per chart review, patient is expected to discharge Tuesday. CSW sent [facility] SN[facility] LTC patient's FL2. CSW will continue to follow.  Expected Discharge Plan: Long Term Nursing Home Barriers to Discharge: Continued Medical Work up               Expected Discharge Plan and Services In-house Referral: Clinical Social Work   Post Acute Care Choice: Skilled Nursing Facility Living arrangements for the past 2 months: Skilled Nursing Facility                                       Social Drivers of Health (SDOH) Interventions SDOH Screenings   Food Insecurity: Patient Unable To Answer (03/25/2024)  Housing: Unknown (03/25/2024)  Transportation Needs: Patient Unable To Answer (03/25/2024)  Utilities: Patient Unable To Answer (03/25/2024)  Depression (PHQ2-9): Low Risk  (05/22/2019)  Social Connections: Unknown (03/25/2024)  Tobacco Use: Medium Risk (03/25/2024)    Readmission Risk Interventions    02/18/2022    2:23 PM  Readmission Risk Prevention Plan  Transportation Screening Complete  PCP or Specialist Appt within 5-7 Days Complete  Home Care Screening Complete  Medication Review (RN CM) Complete

## 2024-03-26 NOTE — Plan of Care (Signed)
 Spoke with nurse at patient resident home to update on length of stay. Most likely staying at least until Tuesday due to need for IV abx, TEE, and holiday weekend.

## 2024-03-26 NOTE — Assessment & Plan Note (Addendum)
 CT abdomen 8/27 showed patchy enhancement within the right kidney which persists on delayed images suspicious for pyelonephritis.  Urine culture on 8/26 growing providencia stuartii-pending sensitivities. - Continue ceftriaxone  2g daily for 5 days 08/28 - 09/01 - Foley changed 08/28

## 2024-03-26 NOTE — Assessment & Plan Note (Signed)
 CVA hx/HLD: ASA 81 mg daily, simvastatin  10 mg daily Paroxysmal A-fib/s/p pacemaker: Continue Lopressor  25 mg twice daily, amiodarone  200 mg daily HTN- losartan  25 mg daily Peripheral edema: Torsemide  10 mg twice daily.  No reported history of CHF, per facility this is for edema. GERD- famotidine  40 mg daily Seizures- valproic  acid 500 mg 3 times daily with meals, 750 at bedtime.  Keppra  1000 mg twice daily.

## 2024-03-26 NOTE — Plan of Care (Signed)

## 2024-03-26 NOTE — Assessment & Plan Note (Addendum)
 Cr improved to .93 today from Cr 1.07. Likely pre-renal. - Restart free water  flushes/tube feeds - Daily BMP

## 2024-03-26 NOTE — Progress Notes (Addendum)
 Ward of the state requested in full print the information listed below. She is to speak with her management to obtain permission for TEE given the use of conscious sedation/general anesthesia. Patient is tentatively scheduled for Tuesday 9/2 pending consent given.   -------------------------------------------------------------------------------------------------------------- What is a TEE? A transesophageal echocardiogram is an invasive ultrasound of the heart. The ultrasound probe is inserted into the mouth and passed out the esophagus to align with the posterior portion of the heart. This allows superior visualization of the posterior heart structures due to the close proximity of the esophagus to the heart. This allows medical professionals the ability to better assess for thrombus or endocarditis.  TEE procedure  Procedure begins with hooking patient up to vital sign monitoring and establishing IV access. Patient's pharynx would be numbed using lidocaine  or benzocaine.  The anesthesia team would determine if patient will be intubated or a native airway would be used for duration of the procedure, this would be a case by case decision depending on anatomy and ability to maintain a protected airway. Anesthesia team will then administer sedatives and patient would be under sedation entirely of TEE procedure with continuous monitoring. Once patient is sedated and stable, TEE performing physician would introduce the probe into patient's mouth and slowly advance the ultrasound probe down the esophagus. Images would be obtained taking about 20 minutes. Once imaging is done, probe would be removed and anesthesia team would cease sedation and safely wake patient up in recovery. For safety reasons, while ultrasound probe is introduced in the esophagus if resistance is met, the probe will be removed and the procedure will be stopped and not re-attempted.  Indication: Patient current has known bacteremia which  is an infection of the blood. She also has a pacemaker as well. This puts her at an increased risk of infection to structures in her heart as well as the device itself.  Absolute contraindications to a TEE -Perforated viscus -Esophageal stricture -Esophageal tumor -Esophageal perforation/laceration -Esophageal diverticulum -Active GI Bleed  Relative contraindications to a TEE, these are not absolute contraindications they only increase risk of complications with a TEE and should be considered for each patient on if benefit outweighs risk. -Altered mental status/uncooperative patient -History of radiation to head, neck, or mediastinum -History of GI surgery -Recent upper GI bleed -History of Barrett esophagus -Esophageal varices -Active esophagitis -Active peptic ulcer disease -Symptomatic hiatal hernia -Unprotected airway -History of dysphagia -Reduced range of motion of cervical spine -Severe thrombocytopenia ( platelet count < 50,000) -Obstructive sleep Apnea  Risks of TEE procedure -Minor complications with incidence rate of 2-5% -transient sore throat -odynophagia -injury to mucosal or teeth -Pharyngeal hematoma due to direct trauma with ultrasound probe. May results in airway compromise if large enough. -Esophageal perforation [risk 1:10,000] -GI bleeding in those with low platelet count as listed above. Incidence rate of 0.03% -Methemoglobinemia due to topical benzocaine which is used to number the pharynx. Incidence rate of 0.07%  Risks of anesthesia Serious complications: mortality risk is < 1% in healthy adults -dangerous heart arrhythmias -myocardial infarction -respiratory failure -stroke -death  Patient does have a history of dysphagia and GI surgery. GI was consulted to evaluate patient for risk with TEE. They deemed no indication for further work-up prior to TEE from a GI standpoint.   Risks not to proceed with the procedure: - IV antibiotic course is  based on this information and a shortened course could result in the inadequate treatment -If patient has developed endocarditis or  infection of the pacemaker, this information could help guide management: - if her pacemaker is infected she may need to have it removed to help treat infection completely - or if a vegetation [colony of bacteria and thrombus] is present this places patient at risk of stroke for which she would need anticoagulation.  -knowing patient has endocarditis would also guide management in regard to antibiotic prophylaxis with dental procedure or management if she were to develop chronic valvular disease in the future   Recovery time of the procedure: about 20 minutes as patient wakes from sedation  Signed, Kathleen LOISE Salen, PA-C

## 2024-03-26 NOTE — Assessment & Plan Note (Addendum)
 Located in epigastric region. History of chronic dysfunction and replacements by IR. - RD consult, appreciate recommendations   - Confirmed her feeding rate is 65 mL/h. 1.5 kcal Osmolite, 1000 mL continuous. Hold between 9 AM-12 PM and 9 PM-12 AM with nursing facility - Per nursing facility, patient is n.p.o. all nutrition/meds through tube - Care precautions - Consult IR if tube needs replacement

## 2024-03-26 NOTE — Progress Notes (Signed)
 Daily Progress Note Intern Pager: [phone]  Patient name: Kathleen Jensen Medical record number: [id number] Date of birth: 05-May-1951 Age: 73 y.o. Gender: female  Primary Care Provider: System, Provider Not In Consultants: ID, wound care, GI Code Status: Full confirmed by patient's social worker  Pt Overview and Major Events to Date:  08/28: Admitted for bacteremia  Assessment and Plan:  Kathleen Jensen is a 73 yoF admitted for bacteremia originally presenting for G-tube dysfunction. 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  Assessment & Plan Bacteremia Aerobic and anaerobic cultures x2 positive for staph epidermidis and methicillin resistance from ED visit 2 days ago. Cultures redrawn and sent.  ED provider spoke with infectious disease, who recommended full gram-positive bacteremia workup and continuing ceftriaxone /vancomycin . - Continue vancomycin  (8/28-) Today is day 2 of TBD. - F/U cultures: no growth x1 day - ID consulted, appreciate recs  - continue cef for pyelo  - isolation precautions - Needs TEE, ID spoke with cards per note. Consent will need to be obtained by [name] Search.  - Cardiology is requesting GI clearance for TEE due to G tube dependence and prior imaging showing hiatal hernia and esophageal thickening  - GI has stated 08/29 there is no indication for EGD prior to TEE and they will discuss with cards. If decision to have EGD, will try to coordinate that EGD and TEE be done during the same encounter given the need for sedation.  Pyelonephritis CT abdomen 8/27 showed patchy enhancement within the right kidney which persists on delayed images suspicious for pyelonephritis.  Urine culture on 8/26 growing providencia stuartii-pending sensitivities. - Continue ceftriaxone  2g daily for 5 days 08/28 -  09/01 - Foley changed 08/28 AKI (acute kidney injury) (HCC) Cr improved to .93 today from Cr 1.07. Likely pre-renal. - Restart free water  flushes/tube feeds - Daily BMP Hypokalemia Mildly decreased at 3.3, repleted with this AM. - Replete as indicated  - AM BMP 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HCC) Located in epigastric region. History of chronic dysfunction and replacements by IR. - RD consult, appreciate recommendations   - Confirmed her feeding rate is 65 mL/h. 1.5 kcal Osmolite, 1000 mL continuous. Hold between 9 AM-12 PM and 9 PM-12 AM with nursing facility - Per nursing facility, patient is n.p.o. all nutrition/meds through tube - Care precautions - Consult IR if tube needs replacement Sacral wound This is based on problem list, patient unable to verbalize.  Will obtain picture when patient moves out of the ED, and order wound care consult. - Appreciate wound care consult Chronic health problem CVA hx/HLD: ASA 81 mg daily, simvastatin  10 mg daily Paroxysmal A-fib/s/p pacemaker: Continue Lopressor  25 mg twice daily, amiodarone  200 mg daily HTN- losartan  25 mg daily Peripheral edema: Torsemide  10 mg twice daily.  No reported history of CHF, per facility this is for edema. GERD- famotidine  40 mg daily Seizures- valproic  acid 500 mg 3 times daily with meals, 750 at bedtime.  Keppra  1000 mg twice daily.  FEN/GI: continuous tube feeds PPx: Lovenox  Dispo:SNF in 73 or more days. Barriers include IV abx, TEE, and upcoming holiday weekend.    Subjective:  Patient was seen and examined at bedside. She is difficult to understand 2/2 aphasia.   Objective: Temp:  [97.5 F (36.4 C)-98 F (36.7 C)] 97.7 F (36.5 C) (08/29 0407) Pulse Rate:  [60-72] 65 (08/29  0407) Resp:  [15-17] 16 (08/29 0407) BP: (103-124)/(63-98) 113/76 (08/29 0407) SpO2:  [95 %-100 %] 97 % (08/29 0407) Weight:  [102.5 kg] 102.5 kg (08/29 0407)  Physical Exam: General: A&O, NAD, lying  supine comfortably in bed Cardiac: RRR, no m/r/g Respiratory: CTAB, normal WOB, no w/c/r GI: Soft, NTTP, non-distended  Extremities: NTTP, no peripheral edema. Neuro: aphasic, follows simple commands Psych: Appropriate mood and affect   Laboratory: Most recent CBC Lab Results  Component Value Date   WBC 5.2 03/25/2024   HGB 15.8 (H) 03/25/2024   HCT 46.8 (H) 03/25/2024   MCV 107.6 (H) 03/25/2024   PLT 137 (L) 03/25/2024   Most recent BMP    Latest Ref Rng & Units 03/25/2024    5:44 AM  BMP  Glucose 70 - 99 mg/dL 78   BUN 8 - 23 mg/dL 39   Creatinine 9.55 - 1.00 mg/dL 8.96   Sodium 864 - 854 mmol/L 138   Potassium 3.5 - 5.1 mmol/L 3.8   Chloride 98 - 111 mmol/L 99   CO2 22 - 32 mmol/L 32   Calcium  8.9 - 10.3 mg/dL 8.0    Lupie Credit, DO 03/26/2024, 7:01 AM  PGY-1, [facility] Family Medicine FPTS Intern pager: [phone], text pages welcome Secure chat group [facility] Teaching Service

## 2024-03-26 NOTE — Assessment & Plan Note (Addendum)
 Aerobic and anaerobic cultures x2 positive for staph epidermidis and methicillin resistance from ED visit 2 days ago. Cultures redrawn and sent.  ED provider spoke with infectious disease, who recommended full gram-positive bacteremia workup and continuing ceftriaxone /vancomycin . - Continue vancomycin  (8/28-) Today is day 2 of TBD. - F/U cultures: no growth x1 day - ID consulted, appreciate recs  - continue cef for pyelo  - isolation precautions - Needs TEE, ID spoke with cards per note. Consent will need to be obtained by [name] Search.  - Cardiology is requesting GI clearance for TEE due to G tube dependence and prior imaging showing hiatal hernia and esophageal thickening  - GI has stated 08/29 there is no indication for EGD prior to TEE and they will discuss with cards. If decision to have EGD, will try to coordinate that EGD and TEE be done during the same encounter given the need for sedation.

## 2024-03-26 NOTE — Plan of Care (Signed)
 Evaluated patient at bedside with Dr. Idelle due to new tachycardia noted in chart.  Her CBG was also 31.  She had no complaints at this time.  When we asked her if she was in any pain she shook her head no.  On exam, she had a regular rate and rhythm.  She was not in any distress.  Of note, her left lower extremity was cooler to touch than her right lower extremity and pulses were difficult to palpate in both extremities.  Could consider ABIs to assess for peripheral vascular disease.  Do not see indication for urgent Doppler at this time as the leg is not swollen, there are no skin changes, and it is not painful.  Will start D5 LR at 40 mL/h and continue CBG checks.  Can discontinue this once her CBG is normalized.  Do not have good explanation for her recurrent hypoglycemia at this time, her tube feeds have been adjusted to her baseline regimen she receives at her facility.

## 2024-03-26 NOTE — Consult Note (Signed)
 Consultation  Referring Provider: Family medicine service/Neil Primary Care Physician:  System, Provider Not In Primary Gastroenterologist: Unassigned  Reason for Consultation: Clear esophagus prior to TEE  HPI: Randee Marlana Mckowen is a 73 y.o. female with history of prior CVA with residual right hemiparesis, neurogenic dysphagia, aphasia, diabetes mellitus, sleep apnea, history of atrial fibrillation, sick sinus syndrome status post pacemaker chronic hyponatremia and asthma.  Indwelling Foley. Patient resides at a nursing facility.  She is G-tube dependent. She had been seen in the ER on 03/23/2024 concern for G-tube dysfunction/dislodgment.  She had undergone CT which apparently showed probable pyelonephritis.  Blood cultures had been done and these returned positive for methicillin-resistant staph epi and she was brought back to the emergency room for admission.  She was evaluated by ID yesterday, bacteremia in setting of pacemaker and TEE has been requested.  She has been afebrile and has no leukocytosis.  No prior endoscopic evaluation that I can see in epic or Care Everywhere.  Cardiology had raised concern about her esophagus given G-tube in place and some previous imaging apparently showing esophageal thickening.  CT of the abdomen pelvis with contrast on 03/24/2024 including views of the lower chest shows no abnormality, patient is status postcholecystectomy stomach within normal limits mild gaseous distention of the colon G-tube in place.  WBC 6.7/hemoglobin 12.7/hematocrit 38.3/MCV 109 Potassium 3.3/BUN 28/creatinine 0.93   Patient unable to offer any history.  Past Medical History:  Diagnosis Date   Aphasia    Arthritis    Asthma    Cardiac pacemaker    Cerebral amyloid angiopathy (CODE)    CKD (chronic kidney disease)    Cognitive communication deficit    Diabetes mellitus without complication (HCC)    resolved after gastric bypass   GERD (gastroesophageal  reflux disease)    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 (HCC)    Hyperlipemia    Hypertension    Insomnia    Intracerebral hemorrhage, intraventricular (HCC)    Muscle weakness    Other abnormalities of gait and mobility    Seizures (HCC)    Stroke (HCC)    Unsteadiness on feet    Vitamin B deficiency     Past Surgical History:  Procedure Laterality Date   BACK SURGERY     Dec 18, 2016 Dr.Torrealba   BREAST BIOPSY Right    BREAST EXCISIONAL BIOPSY     CERVICAL SPINE SURGERY     C4   CESAREAN SECTION     GASTRIC BYPASS  03/2007   IR GJ TUBE CHANGE  11/16/2023   IR REPLC DUODEN/JEJUNO TUBE PERCUT W/FLUORO  01/27/2023   IR REPLC DUODEN/JEJUNO TUBE PERCUT W/FLUORO  03/07/2023   IR REPLC DUODEN/JEJUNO TUBE PERCUT W/FLUORO  04/30/2023   IR REPLC DUODEN/JEJUNO TUBE PERCUT W/FLUORO  09/02/2023   IR REPLC DUODEN/JEJUNO TUBE PERCUT W/FLUORO  09/24/2023   TONSILLECTOMY      Prior to Admission medications   Medication Sig Start Date End Date Taking? Authorizing Provider  acetaminophen  (TYLENOL ) 160 MG/5ML solution Place 20 mLs (640 mg total) into feeding tube every 8 (eight) hours. Patient taking differently: Place 800 mg into feeding tube every 6 (six) hours. 12/03/22  Yes Elgergawy, Brayton RAMAN, MD  acetic acid  0.25 % irrigation 20cc irrigation twice a day, flush foley to prevent clogging 11/06/23  Yes [provider]  amiodarone  (PACERONE ) 200 MG tablet Place 1 tablet (200 mg total) into feeding tube daily. 12/03/22  Yes Elgergawy,  Brayton RAMAN, MD  ascorbic acid  (VITAMIN C ) 500 MG tablet Place 0.5 tablets (250 mg total) into feeding tube 2 (two) times daily. 06/09/23  Yes Rai, Ripudeep K, MD  aspirin  81 MG chewable tablet Place 81 mg into feeding tube at bedtime.   Yes [provider]  chlorhexidine  (PERIDEX ) 0.12 % solution Dab a toothbrush into 15 mLs and brush teeth 2 times a day   Yes [provider]   Cranberry 425 MG CAPS Take 425 mg by mouth in the morning and at bedtime.   Yes [provider]  cyclobenzaprine  (FLEXERIL ) 5 MG tablet Place 2.5 mg into feeding tube in the morning and at bedtime. Hold If Sedated   Yes [provider]  diazePAM , 20 MG Dose, (VALTOCO  20 MG DOSE) 2 x 10 MG/0.1ML LQPK Place 20 mg into the nose daily as needed (seizures).   Yes [provider]  docusate sodium  (COLACE) 100 MG capsule Give 1 capsule per tube twice a day   Yes [provider]  estradiol (ESTRACE) 0.1 MG/GM vaginal cream Place vaginally See admin instructions. Apply a pea-sized amount to the urethra/vaginal wall on Tuesdays and Fridays   Yes [provider]  famotidine  (PEPCID ) 40 MG/5ML suspension Place 40 mg into feeding tube daily. 03/25/22  Yes [provider]  folic acid  (FOLVITE ) 800 MCG tablet Place 800 mcg into feeding tube daily.   Yes [provider]  HYDROcodone -acetaminophen  (NORCO/VICODIN) 5-325 MG tablet Place 1 tablet into feeding tube 2 (two) times daily as needed for moderate pain (pain score 4-6).   Yes [provider]  lactulose  (CHRONULAC ) 10 GM/15ML solution Take 30 g by mouth daily. 11/07/23  Yes [provider]  levETIRAcetam  (KEPPRA ) 100 MG/ML solution Place 1,000 mg into feeding tube 2 (two) times daily.   Yes [provider]  losartan  (COZAAR ) 25 MG tablet Place 1 tablet (25 mg total) into feeding tube daily. Patient taking differently: Place 25 mg into feeding tube daily. Hold for SBP <110 03/06/23  Yes Odell Celinda Balo, MD  Menthol , Topical Analgesic, (BIOFREEZE) 4 % GEL Apply 2 g topically See admin instructions. Apply 2 grams topically to all limbs 2 times a day   Yes [provider]  metoprolol  tartrate (LOPRESSOR ) 25 MG tablet Place 1 tablet (25 mg total) into feeding tube 2 (two) times daily. 12/03/22  Yes Elgergawy, Brayton RAMAN, MD  polyethylene glycol (MIRALAX  / GLYCOLAX ) 17 g  packet Place 34 g into feeding tube daily. 06/09/23  Yes Rai, Ripudeep K, MD  simethicone  (MYLICON) 80 MG chewable tablet Place 80 mg into feeding tube 3 (three) times daily.   Yes [provider]  simvastatin  (ZOCOR ) 10 MG tablet Place 1 tablet (10 mg total) into feeding tube at bedtime. 02/22/22  Yes Cheryle Page, MD  torsemide  (DEMADEX ) 10 MG tablet Give 10 mg per tube every 12 hours for edema, hold for a systolic reading less than 110   Yes [provider]  valproic  acid (DEPAKENE ) 250 MG/5ML solution Place 500-750 mg into feeding tube See admin instructions. 500 mg via tube three times a day and 750 mg at bedtime   Yes [provider]    Current Facility-Administered Medications  Medication Dose Route Frequency Provider Last Rate Last Admin   acetaminophen  (TYLENOL ) 160 MG/5ML solution 650 mg  650 mg Per Tube Q8H Howell Lunger, DO   650 mg at 03/26/24 0525   amiodarone  (PACERONE ) tablet 200 mg  200 mg Per Tube  Daily Howell Lunger, DO   200 mg at 03/26/24 9148   aspirin  chewable tablet 81 mg  81 mg Per Tube QHS Howell Lunger, DO   81 mg at 03/25/24 2237   cefTRIAXone  (ROCEPHIN ) 2 g in sodium chloride  0.9 % 100 mL IVPB  2 g Intravenous Q24H Vu, Trung T, MD       Chlorhexidine  Gluconate Cloth 2 % PADS 6 each  6 each Topical Daily Howell Lunger, DO   6 each at 03/25/24 1519   cyclobenzaprine  (FLEXERIL ) tablet 2.5 mg  2.5 mg Per Tube BID Bronson, Martin, DO   2.5 mg at 03/26/24 9148   dextrose  50 % solution 50 mL  1 ampule Intravenous Once Majeed, Sara, DO       enoxaparin  (LOVENOX ) injection 50 mg  50 mg Subcutaneous Q24H Rosalynn Camie CROME, MD       famotidine  (PEPCID ) 40 MG/5ML suspension 40 mg  40 mg Per Tube Daily Bronson, Martin, DO   40 mg at 03/25/24 1513   feeding supplement (OSMOLITE 1.5 CAL) liquid 1,000 mL  1,000 mL Per Tube Q24H Howell Lunger, DO       And   feeding supplement (OSMOLITE 1.5 CAL) liquid 1,000 mL  1,000 mL Per Tube Q24H Howell Lunger,  DO   1,000 mL at 03/25/24 2327   free water  150 mL  150 mL Per Tube Q4H Howell Lunger, DO   150 mL at 03/26/24 9147   HYDROcodone -acetaminophen  (NORCO/VICODIN) 5-325 MG per tablet 1 tablet  1 tablet Per Tube BID PRN Howell Lunger, DO   1 tablet at 03/25/24 2030   lactulose  (CHRONULAC ) 10 GM/15ML solution 30 g  30 g Per Tube Daily Rosalynn Camie CROME, MD       levETIRAcetam  (KEPPRA ) 100 MG/ML solution 1,000 mg  1,000 mg Per Tube BID Bronson, Martin, DO   1,000 mg at 03/25/24 2321   losartan  (COZAAR ) tablet 25 mg  25 mg Per Tube Daily Howell Lunger, DO   25 mg at 03/26/24 9148   metoprolol  tartrate (LOPRESSOR ) tablet 25 mg  25 mg Per Tube BID Bronson, Martin, DO   25 mg at 03/26/24 0850   polyethylene glycol (MIRALAX  / GLYCOLAX ) packet 34 g  34 g Per Tube Daily Howell Lunger, DO   34 g at 03/26/24 0850   simethicone  (MYLICON) chewable tablet 80 mg  80 mg Per Tube TID Bronson, Martin, DO   80 mg at 03/26/24 9148   simvastatin  (ZOCOR ) tablet 10 mg  10 mg Per Tube QHS Bronson, Martin, DO   10 mg at 03/25/24 2237   torsemide  (DEMADEX ) tablet 10 mg  10 mg Per Tube Q12H Bronson, Martin, DO   10 mg at 03/26/24 9148   valproic  acid (DEPAKENE ) 250 MG/5ML solution 500 mg  500 mg Per Tube TID WC Rosalynn Camie CROME, MD   500 mg at 03/26/24 9143   And   valproic  acid (DEPAKENE ) 250 MG/5ML solution 750 mg  750 mg Per Tube QHS Rosalynn Camie CROME, MD   750 mg at 03/25/24 2236   vancomycin  (VANCOREADY) IVPB 1250 mg/250 mL  1,250 mg Intravenous Q12H Merilee Linsey I, RPH 166.7 mL/hr at 03/26/24 0020 1,250 mg at 03/26/24 0020    Allergies as of 03/25/2024 - Review Complete 03/25/2024  Allergen Reaction Noted   Tape Rash 06/10/2011   Demerol [meperidine hcl] Nausea And Vomiting    Sulfonamide derivatives Itching 09/21/2007   Bacitracin-polymyxin b Dermatitis 01/26/2016   Oxycontin  [oxycodone ] Itching 01/24/2016  Family History  Problem Relation Age of Onset   High blood pressure Mother    Diabetes Mother     Diabetes Father    Stroke Father    Cancer Sister        Colorectal cancer   Cancer Brother        colon    Social History   Socioeconomic History   Marital status: Divorced    Spouse name: Not on file   Number of children: 3   Years of education: 3yrs   Highest education level: Not on file  Occupational History   Occupation: Retired    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   Caffeine Use: 1 cup daily   1 dog   Social Drivers of Corporate investment banker Strain: Not on file  Food Insecurity: Patient Unable To Answer (03/25/2024)   Hunger Vital Sign    Worried About Running Out of Food in the Last Year: Patient unable to answer    Ran Out of Food in the Last Year: Patient unable to answer  Transportation Needs: Patient Unable To Answer (03/25/2024)   PRAPARE - Transportation    Lack of Transportation (Medical): Patient unable to answer    Lack of Transportation (Non-Medical): Patient unable to answer  Physical Activity: Not on file  Stress: Not on file  Social Connections: Unknown (03/25/2024)   Social Connection and Isolation Panel    Frequency of Communication with Friends and Family: Patient unable to answer    Frequency of Social Gatherings with Friends and Family: Patient unable to answer    Attends Religious Services: Patient unable to answer    Active Member of Clubs or Organizations: Not on file    Attends Banker Meetings: Patient unable to answer     Marital Status: Patient unable to answer  Intimate Partner Violence: Patient Unable To Answer (03/25/2024)   Humiliation, Afraid, Rape, and Kick questionnaire    Fear of Current or Ex-Partner: Patient unable to answer    Emotionally Abused: Patient unable to answer    Physically Abused: Patient unable to answer    Sexually Abused: Patient unable to answer    Review of Systems: Pertinent positive and negative review of systems were noted in the above HPI section.  All other review of systems was otherwise negative.   Physical Exam: Vital signs in last 24 hours: Temp:  [97.5 F (36.4 C)-98 F (36.7 C)] 98 F (36.7 C) (08/29 0743) Pulse Rate:  [60-72] 66 (08/29 0743) Resp:  [15-17] 16 (08/29 0407) BP: (103-124)/(63-98) 105/64 (08/29 0743) SpO2:  [95 %-100 %] 96 % (08/29 0743) Weight:  [102.5 kg] 102.5 kg (08/29 0407) Last BM Date : 03/25/24 General:   Alert,  Well-developed, well-nourished, elderly African-American female ,cooperative in NAD, able to say a few words Head:  Normocephalic and atraumatic. Eyes:  Sclera clear, no icterus.   Conjunctiva pink. Ears:  Normal auditory acuity. Nose:  No deformity, discharge,  or lesions. Mouth:  No deformity or lesions.   Neck:  Supple; no masses or thyromegaly. Lungs:  Clear throughout to auscultation.   No wheezes, crackles, or rhonchi.  Heart:  Regular rate and rhythm; no murmurs, clicks, rubs,  or gallops. Abdomen: Obese, soft, bowel sounds are present, midline incisional scars, gastrostomy tube high in the epigastrium Rectal: Not done Msk:  Symmetrical without gross deformities. . Pulses:  Normal pulses noted. Extremities:  Without clubbing or edema. Neurologic:  Alert minimally verbal Skin:  Intact without significant lesions or rashes.. Psych:  Alert and cooperative.  Intake/Output from previous day: 08/28 0701 - 08/29 0700 In: 921 [NG/GT:671; IV Piggyback:250] Out: 2300 [Urine:2300] Intake/Output this shift: Total I/O In:  150 [NG/GT:150] Out: -   Lab Results: Recent Labs    03/23/24 2219 03/25/24 0512 03/26/24 0630  WBC 9.2 5.2 6.7  HGB 13.2 15.8* 12.7  HCT 37.5 46.8* 38.3  PLT 98* 137* 90*   BMET Recent Labs    03/23/24 2322 03/25/24 0544 03/26/24 0630  NA 135 138 138  K 3.6 3.8 3.3*  CL 96* 99 100  CO2 30 32 27  GLUCOSE 83 78 83  BUN 31* 39* 28*  CREATININE 0.87 1.03* 0.93  CALCIUM  7.9* 8.0* 7.8*   LFT Recent Labs    03/23/24 2322  PROT 8.3*  ALBUMIN  2.0*  AST 49*  ALT 22  ALKPHOS 141*  BILITOT 0.5   PT/INR No results for input(s): LABPROT, INR in the last 72 hours. Hepatitis Panel No results for input(s): HEPBSAG, HCVAB, HEPAIGM, HEPBIGM in the last 72 hours.    IMPRESSION:  #30 73 year old female status post previous CVA with right hemiparesis, neurogenic dysphagia, aphasia, gastrostomy tube dependent nursing home resident, with recent CT imaging concerning for pyelonephritis, and recent blood cultures positive for methicillin-resistant staph epi  Patient has pacemaker in place.  ID requesting TEE  G-tube should not affect TEE I do not see any imaging evidence of esophageal thickening, patient just had CT abdomen and pelvis a couple of days ago with images of the lower chest not showing any abnormality of the esophagus  Plan; Will discuss with cardiology, I do not see that there is a good indication for EGD prior to TEE. If decision is made that she has to have EGD would try to coordinate EGD and TEE to be done at the same setting given need for sedation, and consents as patient is a ward of [facility], unable to consent for herself.   Jahziah Simonin PA-C 03/26/2024, 9:59 AM

## 2024-03-26 NOTE — Plan of Care (Signed)
 Spoke with nursing home, [facility].  Confirmed her feeding rate is 65 mL/h.  1.5 kcal Osmolite, 1000 mL continuous.  Hold between 9 AM-12 PM and 9 PM-12 AM.

## 2024-03-26 NOTE — [place] FL2 (Signed)
 [place]  MEDICAID FL2 LEVEL OF CARE FORM     IDENTIFICATION  Patient Name: Kathleen Jensen Birthdate: 1950/09/13 Sex: female Admission Date (Current Location): 03/25/2024  [facility] and [place] Number:  Producer, television/film/video and Address:  [facility], [address] [place], [place] [address]      Provider Number: [id number]  Attending Physician Name and Address:  Rosalynn Camie CROME, MD  Relative Name and Phone Number:  [name] Search; Legal Guardian; [phone]    Current Level of Care: Hospital Recommended Level of Care: Skilled Nursing Facility Prior Approval Number:    Date Approved/Denied:   PASRR Number: [id number]  Discharge Plan: SNF    Current Diagnoses: Patient Active Problem List   Diagnosis Date Noted   Hypokalemia 03/26/2024   Bacteremia 03/25/2024   Pyelonephritis 03/25/2024   Chronic health problem 03/25/2024   AKI (acute kidney injury) (HCC) 03/25/2024   Ileus (HCC) 06/06/2023   Acute cystitis 03/01/2023   Delirium secondary to UTI 03/01/2023   Atrial fibrillation, chronic (HCC) 03/01/2023   History of seizure 03/01/2023   Chronic hyponatremia 03/01/2023   Acute respiratory failure (HCC) 11/16/2022   Abdominal distension 11/16/2022   AMS (altered mental status) 05/17/2022   Altered mental status 05/15/2022   Sacral wound 05/15/2022   Intracranial hemorrhage (HCC)    Pressure injury of skin 02/18/2022   Chronic indwelling Foley catheter 02/18/2022   PAF (paroxysmal atrial fibrillation) (HCC) 02/18/2022   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HCC) 02/18/2022   Septic shock (HCC) 02/15/2022   Constipation    Abdominal pain    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   History of right hemiplegia (HCC) 04/12/2020   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   Hyperlipidemia associated with type 2 diabetes mellitus (HCC) 07/30/2018   History of CVA (cerebrovascular accident)  07/30/2018   Vitamin B 12 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/30/2018   Asthma 03/13/2018   Seizures (HCC) 01/12/2018   CVA (cerebral vascular accident) (HCC) 12/08/2017   Abnormal thyroid  blood test 12/08/2017   Pulmonary embolism and infarction (HCC) 12/08/2017   Chronic anemia 12/08/2017   History of expressive aphasia 10/15/2017   Seizure disorder (HCC) 10/15/2017   Dysphagia 10/15/2017   Hyperlipidemia 09/22/2017   Urinary frequency 09/22/2017   Airway hyperreactivity 01/26/2016   Disease of thyroid  gland 01/26/2016   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   Other specified postprocedural states 01/26/2016   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   Chronic low back pain 04/10/2015   Degeneration of intervertebral disc of lumbar region 04/10/2015   Spondylolisthesis of lumbar region 04/10/2015   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   History of cardiac pacemaker in situ 01/09/2015   B12 DEFICIENCY 08/15/2008   Vitamin D  deficiency 08/15/2008   Sick sinus syndrome (HCC) 08/15/2008   OBSTRUCTIVE SLEEP APNEA 08/11/2008   INSOMNIA 04/28/2008   Hyperlipidemia LDL goal <70 12/12/2006   RHINITIS, ALLERGIC NEC 12/12/2006   Hypothyroidism 12/11/2006   Essential hypertension 12/11/2006   GERD 12/11/2006   STRESS INCONTINENCE 12/11/2006    Orientation RESPIRATION BLADDER Height & Weight     Self  Normal (Room Air) Incontinent, Indwelling catheter Weight: 225 lb 15.5 oz (102.5 kg) Height:  5' 10 (177.8 cm)  BEHAVIORAL SYMPTOMS/MOOD NEUROLOGICAL BOWEL NUTRITION STATUS    Convulsions/Seizures (Seizure Disorder) Continent (Gastrostomy/Enterostomy Jejunostomy 18 Fr. LUQ) Diet (Please see discharge summary)  AMBULATORY STATUS COMMUNICATION OF NEEDS Skin     Verbally PU Stage and Appropriate Care, Other (Comment) Argie /Incision Buttocks Mid  purple maroon discoloration; Pressure Injury Coccyx Mid Deep Tissue - Purple or maroon localized area of discolored intact skin or blood-filled blister due to damage of  underlying soft tissue from pressure and/or shear.)                       Personal Care Assistance Level of Assistance              Functional Limitations Info             SPECIAL CARE FACTORS FREQUENCY                       Contractures Contractures Info: Not present    Additional Factors Info  Code Status, Allergies, Isolation Precautions Code Status Info: Full Code Allergies Info: Tape; Demerol (meperidine Hcl); Sulfonamide Derivatives; Bacitracin-polymyxin B; Oxycontin  (oxycodone )     Isolation Precautions Info: Protective Precautions     Current Medications (03/26/2024):  This is the current hospital active medication list Current Facility-Administered Medications  Medication Dose Route Frequency Provider Last Rate Last Admin   acetaminophen  (TYLENOL ) 160 MG/5ML solution 650 mg  650 mg Per Tube Q8H Howell Lunger, DO   650 mg at 03/26/24 0525   amiodarone  (PACERONE ) tablet 200 mg  200 mg Per Tube Daily Howell Lunger, DO   200 mg at 03/26/24 9148   aspirin  chewable tablet 81 mg  81 mg Per Tube QHS Howell Lunger, DO   81 mg at 03/25/24 2237   cefTRIAXone  (ROCEPHIN ) 2 g in sodium chloride  0.9 % 100 mL IVPB  2 g Intravenous Q24H Vu, Trung T, MD 200 mL/hr at 03/26/24 1239 2 g at 03/26/24 1239   Chlorhexidine  Gluconate Cloth 2 % PADS 6 each  6 each Topical Daily Howell Lunger, DO   6 each at 03/26/24 1037   cyclobenzaprine  (FLEXERIL ) tablet 2.5 mg  2.5 mg Per Tube BID Bronson, Martin, DO   2.5 mg at 03/26/24 0851   dextrose  50 % solution 50 mL  50 mL Intravenous PRN Lupie Credit, DO   50 mL at 03/26/24 1231   enoxaparin  (LOVENOX ) injection 50 mg  50 mg Subcutaneous Q24H Rosalynn Credit CROME, MD   50 mg at 03/26/24 1232   famotidine  (PEPCID ) 40 MG/5ML suspension 40 mg  40 mg Per Tube Daily Bronson, Martin, DO   40 mg at 03/26/24 1033   feeding supplement (OSMOLITE 1.5 CAL) liquid 1,000 mL  1,000 mL Per Tube Q24H Rosalynn Credit CROME, MD   1,000 mL at 03/26/24 1242   And    [START ON 03/27/2024] feeding supplement (OSMOLITE 1.5 CAL) liquid 1,000 mL  1,000 mL Per Tube Q24H Rosalynn Credit CROME, MD       feeding supplement (PROSource TF20) liquid 60 mL  60 mL Per Tube BID Rosalynn Credit CROME, MD   60 mL at 03/26/24 1328   free water  150 mL  150 mL Per Tube Q4H Howell Lunger, DO   150 mL at 03/26/24 1240   HYDROcodone -acetaminophen  (NORCO/VICODIN) 5-325 MG per tablet 1 tablet  1 tablet Per Tube BID PRN Howell Lunger, DO   1 tablet at 03/26/24 1037   lactulose  (CHRONULAC ) 10 GM/15ML solution 30 g  30 g Per Tube Daily Rosalynn Credit CROME, MD   30 g at 03/26/24 1036   levETIRAcetam  (KEPPRA ) 100 MG/ML solution 1,000 mg  1,000 mg Per Tube BID Bronson, Martin, DO   1,000 mg at 03/26/24 1037   losartan  (COZAAR ) tablet  25 mg  25 mg Per Tube Daily Howell Lunger, DO   25 mg at 03/26/24 9148   metoprolol  tartrate (LOPRESSOR ) tablet 25 mg  25 mg Per Tube BID Bronson, Martin, DO   25 mg at 03/26/24 9149   multivitamin with minerals tablet 1 tablet  1 tablet Per Tube Daily Rosalynn Camie CROME, MD   1 tablet at 03/26/24 1300   polyethylene glycol (MIRALAX  / GLYCOLAX ) packet 34 g  34 g Per Tube Daily Howell Lunger, DO   34 g at 03/26/24 0850   simethicone  (MYLICON) chewable tablet 80 mg  80 mg Per Tube TID Bronson, Martin, DO   80 mg at 03/26/24 9148   simvastatin  (ZOCOR ) tablet 10 mg  10 mg Per Tube QHS Howell Lunger, DO   10 mg at 03/25/24 2237   torsemide  (DEMADEX ) tablet 10 mg  10 mg Per Tube Q12H Howell Lunger, DO   10 mg at 03/26/24 9148   valproic  acid (DEPAKENE ) 250 MG/5ML solution 500 mg  500 mg Per Tube TID WC Rosalynn Camie CROME, MD   500 mg at 03/26/24 1300   And   valproic  acid (DEPAKENE ) 250 MG/5ML solution 750 mg  750 mg Per Tube QHS Rosalynn Camie CROME, MD   750 mg at 03/25/24 2236   vancomycin  (VANCOREADY) IVPB 1250 mg/250 mL  1,250 mg Intravenous Q12H Merilee Linsey I, RPH 166.7 mL/hr at 03/26/24 1322 1,250 mg at 03/26/24 1322     Discharge Medications: Please see discharge summary for a list  of discharge medications.  Relevant Imaging Results:  Relevant Lab Results:   Additional Information SS#: [id number]  Lauraine FORBES Saa, LCSWA

## 2024-03-26 NOTE — Progress Notes (Signed)
 Previous staff had feed set up as only flushing.  Reprogrammed kangaroo pump so pt is getting feed.  Blood sugar was low today at 31 on day shift. Blood sugars stable at this time. D5 with LR running.  Started on my shift as staff didn't have it tubed earlier in day

## 2024-03-26 NOTE — Assessment & Plan Note (Signed)
 Mildly decreased at 3.3, repleted with this AM. - Replete as indicated  - AM BMP

## 2024-03-26 NOTE — Consult Note (Signed)
 WOC Nurse Consult Note: Reason for Consult: sacral wound Wound type: Deep Tissue Pressure Injury coccyx/buttocks purple maroon discoloration with some developing necrotic tissue coccyx  Pressure Injury POA: Yes Measurement: see nursing flowsheet; extensive  Wound bed: purple maroon discoloration, developing necrotic tissue at coccyx; some scattered pink areas of partial thickness skin loss  Drainage (amount, consistency, odor) see nursing flowsheet  Periwound: Dressing procedure/placement/frequency: Cleanse coccyx/buttocks with Vashe wound cleanser Soila [phone]) do not rinse and allow to air dry. Apply Xeroform gauze ([place] [phone]) to coccyx/buttocks daily.  Secure with silicone foam or ABD pad and tape.   Keeping the pressure off this area will be key. Patient should be placed on a low air loss mattress for pressure redistribution and moisture management. If patient is up to chair use a pressure redistribution pad Gerlean [phone].   POC discussed with bedside nurse. WOC team will not follow. Please reconsult if further needs arise.  DEEP TISSUE PRESSURE INJURIES ARE HIGH RISK TO DETERIORATE.   Thank you,    Powell Bar MSN, RN-BC, [employer]

## 2024-03-26 NOTE — Assessment & Plan Note (Addendum)
 This is based on problem list, patient unable to verbalize.  Will obtain picture when patient moves out of the ED, and order wound care consult. - Appreciate wound care consult

## 2024-03-27 ENCOUNTER — Telehealth (HOSPITAL_BASED_OUTPATIENT_CLINIC_OR_DEPARTMENT_OTHER): Payer: Self-pay | Admitting: *Deleted

## 2024-03-27 DIAGNOSIS — R7881 Bacteremia: Secondary | ICD-10-CM | POA: Diagnosis not present

## 2024-03-27 DIAGNOSIS — Z789 Other specified health status: Secondary | ICD-10-CM | POA: Diagnosis not present

## 2024-03-27 DIAGNOSIS — N12 Tubulo-interstitial nephritis, not specified as acute or chronic: Secondary | ICD-10-CM

## 2024-03-27 LAB — BASIC METABOLIC PANEL WITH GFR
Anion gap: 12 (ref 5–15)
Anion gap: 13 (ref 5–15)
BUN: 24 mg/dL — ABNORMAL HIGH (ref 8–23)
BUN: 20 mg/dL (ref 8–23)
CO2: 23 mmol/L (ref 22–32)
CO2: 27 mmol/L (ref 22–32)
Calcium: 7.6 mg/dL — ABNORMAL LOW (ref 8.9–10.3)
Calcium: 7.9 mg/dL — ABNORMAL LOW (ref 8.9–10.3)
Chloride: 103 mmol/L (ref 98–111)
Chloride: 102 mmol/L (ref 98–111)
Creatinine, Ser: 0.72 mg/dL (ref 0.44–1.00)
Creatinine, Ser: 0.77 mg/dL (ref 0.44–1.00)
GFR, Estimated: >60 mL/min (ref >60)
GFR, Estimated: >60 mL/min (ref >60)
Glucose, Bld: 148 mg/dL — ABNORMAL HIGH (ref 70–99)
Glucose, Bld: 109 mg/dL — ABNORMAL HIGH (ref 70–99)
Potassium: 3.6 mmol/L (ref 3.5–5.1)
Potassium: 3.1 mmol/L — ABNORMAL LOW (ref 3.5–5.1)
Sodium: 138 mmol/L (ref 135–145)
Sodium: 142 mmol/L (ref 135–145)

## 2024-03-27 LAB — GLUCOSE, CAPILLARY
Glucose-Capillary: 57 mg/dL — ABNORMAL LOW (ref 70–99)
Glucose-Capillary: 119 mg/dL — ABNORMAL HIGH (ref 70–99)
Glucose-Capillary: 44 mg/dL — CL (ref 70–99)
Glucose-Capillary: 101 mg/dL — ABNORMAL HIGH (ref 70–99)
Glucose-Capillary: 123 mg/dL — ABNORMAL HIGH (ref 70–99)

## 2024-03-27 LAB — GLUCOSE, RANDOM: Glucose, Bld: 131 mg/dL — ABNORMAL HIGH (ref 70–99)

## 2024-03-27 MED ORDER — POTASSIUM CHLORIDE 20 MEQ PO PACK
40.0000 meq | PACK | Freq: Two times a day (BID) | ORAL | Status: DC
  Administered 2024-03-27 – 2024-03-31 (×8): 40 meq
  Filled 2024-03-27 (×8): qty 2

## 2024-03-27 NOTE — Assessment & Plan Note (Deleted)
 Mildly decreased at 3.3, repleted with this AM. - Replete as indicated  - AM BMP

## 2024-03-27 NOTE — Consult Note (Signed)
 WOC team has consulted on this patients buttocks/sacrum (see note 03/26/2024) with orders placed for wound care and low air loss mattress.    Secure chat sent to primary MD and bedside nurse regarding above.   Thank you,    Powell Bar MSN, RN-BC, [employer]

## 2024-03-27 NOTE — Assessment & Plan Note (Addendum)
 Chronic. - Appreciate wound care consult - Flexiseal ordered

## 2024-03-27 NOTE — Assessment & Plan Note (Deleted)
 Cr improved to .93 today from Cr 1.07. Likely pre-renal. Resolved - Restart free water  flushes/tube feeds - Daily BMP

## 2024-03-27 NOTE — Assessment & Plan Note (Deleted)
 Aerobic and anaerobic cultures x2 positive for staph epidermidis and methicillin resistance from ED visit 2 days ago. Cultures redrawn and sent.  ED provider spoke with infectious disease, who recommended full gram-positive bacteremia workup and continuing ceftriaxone /vancomycin . - Continue vancomycin  (8/28-) Today is day 2 of TBD. - F/U cultures: no growth x1 day - ID consulted, appreciate recs  - continue cef for pyelo  - isolation precautions - Needs TEE, ID spoke with cards per note. Consent will need to be obtained by [name] Search.  - Cardiology is requesting GI clearance for TEE due to G tube dependence and prior imaging showing hiatal hernia and esophageal thickening  - GI has stated 08/29 there is no indication for EGD prior to TEE and they will discuss with cards. If decision to have EGD, will try to coordinate that EGD and TEE be done during the same encounter given the need for sedation.

## 2024-03-27 NOTE — Progress Notes (Signed)
 Daily Progress Note Intern Pager: [phone]  Patient name: Kathleen Jensen Medical record number: [id number] Date of birth: Jun 30, 1951 Age: 73 y.o. Gender: female  Primary Care Provider: System, Provider Not In Consultants: ID, GI, cardiology Code Status: Full confirmed by pt social worker   Pt Overview and Major Events to Date:  08/28: admitted for bacteremia   Assessment and Plan:  Kathleen Jensen is a 73 yoF admitted for bacteremia originally presenting for G-tube dysfunction. 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  Assessment & Plan Bacteremia Aerobic and anaerobic cultures x2 positive for staph epidermidis and methicillin resistance from ED visit 2 days ago. Cultures redrawn and sent.  ED provider spoke with infectious disease, who recommended full gram-positive bacteremia workup and continuing ceftriaxone /vancomycin . - Continue vancomycin  (8/28-) Today is day 3 of TBD. - F/U cultures: no growth x2 day anaerobic, gram + cocci clusters in aerobic bottle - ID consulted, appreciate recs  - continue cef for pyelo  - isolation precautions - Needs TEE, ID spoke with cards per note. Consent will need to be obtained by [name] Search.  - Cardiology is requesting GI clearance for TEE due to G tube dependence and prior imaging showing hiatal hernia and esophageal thickening  - GI has stated 08/29 there is no indication for EGD prior to TEE, they have signed off Pyelonephritis CT abdomen 8/27 showed patchy enhancement within the right kidney which persists on delayed images suspicious for pyelonephritis.  Urine culture on 8/26 growing providencia stuartii-pending sensitivities. - Continue ceftriaxone  2g daily for 5 days 08/28 - 09/01 - Foley changed 08/28 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HCC) Located in  epigastric region. History of chronic dysfunction and replacements by IR. - RD consult, appreciate recommendations   - Confirmed her feeding rate is 65 mL/h. 1.5 kcal Osmolite, 1000 mL continuous. Hold between 9 AM-12 PM and 9 PM-12 AM with nursing facility - Per nursing facility, patient is n.p.o. all nutrition/meds through tube - Care precautions - Consult IR if tube needs replacement Sacral wound Chronic. - Appreciate wound care consult - Flexiseal ordered  Chronic health problem CVA hx/HLD: ASA 81 mg daily, simvastatin  10 mg daily Paroxysmal A-fib/s/p pacemaker: Continue Lopressor  25 mg twice daily, amiodarone  200 mg daily HTN- losartan  25 mg daily Peripheral edema: Torsemide  10 mg twice daily.  No reported history of CHF, per facility this is for edema. GERD- famotidine  40 mg daily Seizures- valproic  acid 500 mg 3 times daily with meals, 750 at bedtime.  Keppra  1000 mg twice daily.  FEN/GI: tube feeds PPx: Lovenox  Dispo:SNF pending clinical improvement . Barriers include TEE.   Subjective:  Patient was seen and examined at bedside. She was not in any distress.   Objective: Temp:  [97.9 F (36.6 C)-98.6 F (37 C)] 97.9 F (36.6 C) (08/30 0914) Pulse Rate:  [76-133] 118 (08/30 0914) Resp:  [16] 16 (08/30 0914) BP: (93-144)/(68-74) 93/74 (08/30 0914) SpO2:  [90 %-97 %] 90 % (08/30 0914) Weight:  [101.7 kg] 101.7 kg (08/30 0705)  Physical Exam: General: alert, NAD Cardiovascular: bradycardic, no m/r/g Respiratory: CTAB, normal WOB Abdomen: soft, non-tender, non--distended Extremities: cool to touch most likely 2/2 poor perfusion, 2+ DP and radial pulses bilaterally  Laboratory: Most recent CBC Lab Results  Component Value Date   WBC 6.7 03/26/2024   HGB 12.7 03/26/2024   HCT 38.3 03/26/2024   MCV  109.1 (H) 03/26/2024   PLT 90 (L) 03/26/2024   Most recent BMP    Latest Ref Rng & Units 03/27/2024    4:30 AM  BMP  Glucose 70 - 99 mg/dL 851   BUN 8 - 23 mg/dL 24    Creatinine 9.55 - 1.00 mg/dL 9.27   Sodium 864 - 854 mmol/L 138   Potassium 3.5 - 5.1 mmol/L 3.6   Chloride 98 - 111 mmol/L 103   CO2 22 - 32 mmol/L 23   Calcium  8.9 - 10.3 mg/dL 7.6    Lupie Credit, DO 03/27/2024, 10:42 AM  PGY-1, [facility] FPTS Intern pager: [phone], text pages welcome Secure chat group [facility] Teaching Service

## 2024-03-27 NOTE — Telephone Encounter (Signed)
 Post ED Visit - Positive Culture Follow-up  Culture report reviewed by antimicrobial stewardship pharmacist: [facility] []  Rankin Dee, Pharm.D. []  Venetia Gully, Pharm.D., BCPS AQ-ID []  Garrel Crews, Pharm.D., BCPS []  Almarie Lunger, Pharm.D., BCPS []  Rosalie, [address].D., BCPS, AAHIVP []  Rosaline Bihari, Pharm.D., BCPS, AAHIVP []  Vernell Meier, PharmD, BCPS []  Latanya Hint, PharmD, BCPS []  Donald Medley, PharmD, BCPS []  Rocky Bold, PharmD []  Dorothyann Alert, PharmD, BCPS [x]  Dorn Buttner, PharmD  [facility] []  Rosaline Edison, PharmD []  Romona Bliss, PharmD []  Dolphus Roller, PharmD []  Veva Seip, Rph []  Vernell Daunt) Leonce, PharmD []  Eva Allis, PharmD []  Rosaline Millet, PharmD []  Iantha Batch, PharmD []  Arvin Gauss, PharmD []  Wanda Hasting, PharmD []  Ronal Rav, PharmD []  Rocky Slade, PharmD []  Bard Jeans, PharmD   Positive urine culture Treated with Cefpodoxime  Proxetil, organism sensitive to the same and no further patient follow-up is required at this time.  Kathleen Jensen 03/27/2024, 2:33 PM

## 2024-03-27 NOTE — Progress Notes (Signed)
      INFECTIOUS DISEASE ATTENDING ADDENDUM:  Urine cultures did grow Proteus.  This is targeted by the ceftriaxone .  Would continue for 5 days of continuous therapy  MRSE in the blood in the context of cardiac device.  Needs TEE which is upcoming    Kathleen Jensen 03/27/2024, 12:48 PM

## 2024-03-27 NOTE — Plan of Care (Signed)
 Spoke with patient's RN from [facility] who states that a rectal pouch was placed on Tuesday, 03/23/2024 by their wound nurse for chronic diarrhea from her tube feeds in order to protect her large sacral wound.  We have transitioned her to a Flexi-Seal during this admission.

## 2024-03-27 NOTE — Assessment & Plan Note (Deleted)
 CT abdomen 8/27 showed patchy enhancement within the right kidney which persists on delayed images suspicious for pyelonephritis.  Urine culture on 8/26 growing providencia stuartii-pending sensitivities. - Continue ceftriaxone  2g daily for 5 days 08/28 - 09/01 - Foley changed 08/28

## 2024-03-27 NOTE — Assessment & Plan Note (Deleted)
 Cr improved to .93 today from Cr 1.07. Likely pre-renal. - Restart free water  flushes/tube feeds - Daily BMP

## 2024-03-27 NOTE — Progress Notes (Signed)
 See media for updated wound picture. Pt placed on special mattress per order. Kathleen Jensen Kathleen Jensen

## 2024-03-27 NOTE — Progress Notes (Signed)
 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 Pulled out bag and pulled off wafer.  Slight bleeding occurred with removal of wafer. Pt cleansed and redressed after rectal tube inserted.  Rectal tube draining appropriately at this time. Will make a safety zone for this incident. It's unknown if this pt came to hospital with it from facility or if it was done here. See wound care note- no mention of the bag on exam, however the pictures taken looks like what this RN pulled out.

## 2024-03-27 NOTE — Assessment & Plan Note (Deleted)
 CVA hx/HLD: ASA 81 mg daily, simvastatin  10 mg daily Paroxysmal A-fib/s/p pacemaker: Continue Lopressor  25 mg twice daily, amiodarone  200 mg daily HTN- losartan  25 mg daily Peripheral edema: Torsemide  10 mg twice daily.  No reported history of CHF, per facility this is for edema. GERD- famotidine  40 mg daily Seizures- valproic  acid 500 mg 3 times daily with meals, 750 at bedtime.  Keppra  1000 mg twice daily.

## 2024-03-27 NOTE — Assessment & Plan Note (Addendum)
 Located in epigastric region. History of chronic dysfunction and replacements by IR. - RD consult, appreciate recommendations   - Confirmed her feeding rate is 65 mL/h. 1.5 kcal Osmolite, 1000 mL continuous. Hold between 9 AM-12 PM and 9 PM-12 AM with nursing facility - Per nursing facility, patient is n.p.o. all nutrition/meds through tube - Care precautions - Consult IR if tube needs replacement

## 2024-03-27 NOTE — Assessment & Plan Note (Signed)
 VSS this AM. Original cultures positive for MRSE; patient does have implanted cardiac device. Repeat cultures 8/28, definitive identification and susceptibilities pending. - Continue Abx: vancomycin  (8/28 - ), CTX (8/28 - 9/1). - Needs TEE; tentatively scheduled 9/2 pending consent - need to obtain from legal guardian, [name] Search. Plan to re-involve GI if TEE scope unable to pass through esophagus.

## 2024-03-27 NOTE — Assessment & Plan Note (Signed)
 CVA hx/HLD: ASA 81 mg daily, simvastatin  10 mg daily Paroxysmal A-fib/s/p pacemaker: Continue Lopressor  25 mg twice daily, amiodarone  200 mg daily HTN- losartan  25 mg daily Peripheral edema: Torsemide  10 mg twice daily.  No reported history of CHF, per facility this is for edema. GERD- famotidine  40 mg daily Seizures- valproic  acid 500 mg 3 times daily with meals, 750 at bedtime.  Keppra  1000 mg twice daily.

## 2024-03-27 NOTE — Assessment & Plan Note (Signed)
 CT abdomen 8/27 showed patchy enhancement within the right kidney which persists on delayed images suspicious for pyelonephritis.  Urine culture on 8/26 growing providencia stuartii-pending sensitivities. - Continue ceftriaxone  2g daily for 5 days 08/28 - 09/01 - Foley changed 08/28

## 2024-03-27 NOTE — Progress Notes (Signed)
 Cbg 44-will give prn dextrose -see MAR-will continue to monitor-see chart review. Kathleen Jensen Kathleen Jensen

## 2024-03-27 NOTE — Assessment & Plan Note (Deleted)
 Located in epigastric region. History of chronic dysfunction and replacements by IR. - RD consult, appreciate recommendations   - Confirmed her feeding rate is 65 mL/h. 1.5 kcal Osmolite, 1000 mL continuous. Hold between 9 AM-12 PM and 9 PM-12 AM with nursing facility - Per nursing facility, patient is n.p.o. all nutrition/meds through tube - Care precautions - Consult IR if tube needs replacement

## 2024-03-27 NOTE — Assessment & Plan Note (Signed)
 VSS. Foley exchanged 8/28. - Continue CTX x5 days (8/28 - 9/1)

## 2024-03-27 NOTE — Progress Notes (Signed)
     Daily Progress Note Intern Pager: [phone]  Patient name: Kathleen Jensen Medical record number: [id number] Date of birth: 13-Feb-1951 Age: 73 y.o. Gender: female  Primary Care Provider: System, Provider Not In Consultants: ID, GI, Cardiology Code Status: FULL  Pt Overview and Major Events to Date:  8/28 - admitted to [facility]  Assessment and Plan: Kathleen Jensen is a 73 y.o. admitted for bacteremia and found to have pyelonephritis.  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  Of note, pt is a ward of the state and has legal guardian. Assessment & Plan Bacteremia VSS this AM. Original cultures positive for MRSE; patient does have implanted cardiac device. Repeat cultures 8/28, definitive identification and susceptibilities pending. - Continue Abx: vancomycin  (8/28 - ), CTX (8/28 - 9/1). - Needs TEE; tentatively scheduled 9/2 pending consent - need to obtain from legal guardian, [name] Search. Plan to re-involve GI if TEE scope unable to pass through esophagus. Pyelonephritis VSS. Foley exchanged 8/28. - Continue CTX x5 days (8/28 - 9/1) 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HCC) H/o chronic dysfunction. She is NPO, all nutrition and meds via tube. - RD consulted, recs as follows:  - Feeding rate 65 mL/h. 1.5 kcal Osmolite, 1000 mL continuous. Hold between 9 AM-12 PM and 9 PM-12 AM. - Consult IR if tube needs replacement. Chronic health problem CVA hx/HLD: ASA 81 mg daily, simvastatin  10 mg daily. Paroxysmal A-fib/s/p pacemaker: Continue Lopressor  25 mg BID, amiodarone  200 mg daily. HTN: losartan  25 mg daily. Peripheral edema: Torsemide  10 mg BID.  No reported history of CHF, per facility this is for edema. GERD: famotidine  40 mg daily. Seizures: valproic  acid 500 mg TID with meals, and additional 750 mg at bedtime. Keppra  1000 mg BID. Sacral  wound: wound care consulted, flexiseal placed.   FEN/GI: NPO, all meds/nutrition via PEG PPx: Lovenox  Dispo: Return to SNF possibly 9/2 pending further workup, clinical improvement.   Subjective:  Patient sleeping comfortably in bed this morning; did not disturb her rest.  Objective: Temp:  [97.7 F (36.5 C)-98.7 F (37.1 C)] 97.7 F (36.5 C) (08/31 0500) Pulse Rate:  [62-118] 62 (08/31 0500) Resp:  [16-18] 17 (08/31 0500) BP: (65-111)/(41-84) 102/79 (08/31 0500) SpO2:  [90 %-100 %] 99 % (08/31 0500) Weight:  [101.7 kg-110.2 kg] 110.2 kg (08/31 0500) Physical Exam: General: well-appearing sleeping peacefully in bed; NAD. Cardiovascular: RRR Respiratory: Normal work of breathing.  Laboratory: Most recent CBC Lab Results  Component Value Date   WBC 6.7 03/26/2024   HGB 12.7 03/26/2024   HCT 38.3 03/26/2024   MCV 109.1 (H) 03/26/2024   PLT 90 (L) 03/26/2024   Most recent BMP    Latest Ref Rng & Units 03/27/2024    3:19 PM  BMP  Glucose 70 - 99 mg/dL 890   BUN 8 - 23 mg/dL 20   Creatinine 9.55 - 1.00 mg/dL 9.22   Sodium 864 - 854 mmol/L 142   Potassium 3.5 - 5.1 mmol/L 3.1   Chloride 98 - 111 mmol/L 102   CO2 22 - 32 mmol/L 27   Calcium  8.9 - 10.3 mg/dL 7.9     Lafe Domino, DO 03/28/2024, 6:45 AM  PGY-2, [facility] FPTS Intern pager: [phone], text pages welcome Secure chat group [facility] Teaching Service

## 2024-03-27 NOTE — Assessment & Plan Note (Deleted)
 Mildly decreased at 3.3, repleted with this AM. - Replete as indicated  - AM BMP Resolved

## 2024-03-27 NOTE — Plan of Care (Signed)
 Reviewed hypoglycemia.  Question if point-of-care fingersticks are accurate, with serums being higher.  Will transition to checking serum's twice daily, or obtain stat CBG if hypoglycemia suspected.  We will monitor closely.

## 2024-03-27 NOTE — Assessment & Plan Note (Signed)
 H/o chronic dysfunction. She is NPO, all nutrition and meds via tube. - RD consulted, recs as follows:  - Feeding rate 65 mL/h. 1.5 kcal Osmolite, 1000 mL continuous. Hold between 9 AM-12 PM and 9 PM-12 AM. - Consult IR if tube needs replacement.

## 2024-03-27 NOTE — Progress Notes (Signed)
 Cbg 57-administering prn dextrose -see MAR, will continue to monitor and recheck cbg-see chart review. Destenie Ingber S Henlee Donovan

## 2024-03-27 NOTE — Assessment & Plan Note (Signed)
 CVA hx/HLD: ASA 81 mg daily, simvastatin  10 mg daily. Paroxysmal A-fib/s/p pacemaker: Continue Lopressor  25 mg BID, amiodarone  200 mg daily. HTN: losartan  25 mg daily. Peripheral edema: Torsemide  10 mg BID.  No reported history of CHF, per facility this is for edema. GERD: famotidine  40 mg daily. Seizures: valproic  acid 500 mg TID with meals, and additional 750 mg at bedtime. Keppra  1000 mg BID. Sacral wound: wound care consulted, flexiseal placed.

## 2024-03-27 NOTE — Assessment & Plan Note (Deleted)
 This is based on problem list, patient unable to verbalize.  Will obtain picture when patient moves out of the ED, and order wound care consult. - Appreciate wound care consult

## 2024-03-27 NOTE — Plan of Care (Signed)
  Problem: Nutrition: Goal: Adequate nutrition will be maintained Outcome: Progressing   Problem: Safety: Goal: Ability to remain free from injury will improve Outcome: Progressing   Problem: Education: Goal: Knowledge of General Education information will improve Description: Including pain rating scale, medication(s)/side effects and non-pharmacologic comfort measures Outcome: Not Progressing   Problem: Health Behavior/Discharge Planning: Goal: Ability to manage health-related needs will improve Outcome: Not Progressing   Problem: Clinical Measurements: Goal: Ability to maintain clinical measurements within normal limits will improve Outcome: Not Progressing Goal: Will remain free from infection Outcome: Not Progressing Goal: Diagnostic test results will improve Outcome: Not Progressing Goal: Respiratory complications will improve Outcome: Not Progressing   Problem: Activity: Goal: Risk for activity intolerance will decrease Outcome: Not Progressing   Problem: Elimination: Goal: Will not experience complications related to bowel motility Outcome: Not Progressing Goal: Will not experience complications related to urinary retention Outcome: Not Progressing   Problem: Pain Managment: Goal: General experience of comfort will improve and/or be controlled Outcome: Not Progressing   Problem: Skin Integrity: Goal: Risk for impaired skin integrity will decrease Outcome: Not Progressing

## 2024-03-27 NOTE — Assessment & Plan Note (Addendum)
 Aerobic and anaerobic cultures x2 positive for staph epidermidis and methicillin resistance from ED visit 2 days ago. Cultures redrawn and sent.  ED provider spoke with infectious disease, who recommended full gram-positive bacteremia workup and continuing ceftriaxone /vancomycin . - Continue vancomycin  (8/28-) Today is day 3 of TBD. - F/U cultures: no growth x2 day anaerobic, gram + cocci clusters in aerobic bottle - ID consulted, appreciate recs  - continue cef for pyelo  - isolation precautions - Needs TEE, ID spoke with cards per note. Consent will need to be obtained by [name] Search.  - Cardiology is requesting GI clearance for TEE due to G tube dependence and prior imaging showing hiatal hernia and esophageal thickening  - GI has stated 08/29 there is no indication for EGD prior to TEE, they have signed off

## 2024-03-28 DIAGNOSIS — Z931 Gastrostomy status: Secondary | ICD-10-CM | POA: Diagnosis not present

## 2024-03-28 DIAGNOSIS — R7881 Bacteremia: Secondary | ICD-10-CM | POA: Diagnosis not present

## 2024-03-28 LAB — GLUCOSE, RANDOM: Glucose, Bld: 107 mg/dL — ABNORMAL HIGH (ref 70–99)

## 2024-03-28 LAB — CULTURE, BLOOD (ROUTINE X 2): Special Requests: ADEQUATE

## 2024-03-28 LAB — VANCOMYCIN, PEAK
Vancomycin Pk: >60 ug/mL (ref 30–40)
Vancomycin Pk: 54 ug/mL (ref 30–40)

## 2024-03-28 MED ORDER — VANCOMYCIN VARIABLE DOSE PER UNSTABLE RENAL FUNCTION (PHARMACIST DOSING): Status: DC

## 2024-03-28 NOTE — Progress Notes (Signed)
 Pharmacy Antibiotic Note  Kathleen Jensen is a 73 y.o. female admitted on 03/25/2024 with MRSE bacteremia in the setting of pacemaker.  TEE pending.  Pharmacy has been consulted for Vancomycin  dosing.  Today is abx day #4.  Patient's renal function has remain stable / improved since the start of Vancomycin .  Initial Vancomycin  peak ordered today was within 30 minutes of infusion end so there was concern it was erroneously high.  However, repeat Vancomycin  peak remains elevated at 54.  Will hold further Vancomycin  doses and repeat a random Vancomycin  level in 24 hours.  Pharmacy can calculate a new regimen at that time.  Plan: Hold Vancomycin  Random Vancomycin  level scheduled for 9/1 at 5pm Rx will follow  Height: 5' 10 (177.8 cm) Weight: 110.2 kg (243 lb) IBW/kg (Calculated) : 68.5  Temp (24hrs), Avg:97.7 F (36.5 C), Min:97.6 F (36.4 C), Max:97.7 F (36.5 C)  Recent Labs  Lab 03/23/24 2219 03/23/24 2322 03/25/24 0512 03/25/24 0544 03/26/24 0630 03/27/24 0430 03/27/24 1519 03/28/24 1449 03/28/24 1729  WBC 9.2  --  5.2  --  6.7  --   --   --   --   CREATININE  --  0.87  --  1.03* 0.93 0.72 0.77  --   --   LATICACIDVEN  --   --  1.9  --   --   --   --   --   --   VANCOPEAK  --   --   --   --   --   --   --  >60* 54*    Estimated Creatinine Clearance: 84.2 mL/min (by C-G formula based on SCr of 0.77 mg/dL).    Allergies  Allergen Reactions   Tape Rash   Demerol [Meperidine Hcl] Nausea And Vomiting   Sulfonamide Derivatives Itching   Bacitracin-Polymyxin B Dermatitis    Not listed on the MAR   Oxycontin  [Oxycodone ] Itching    Antimicrobials this admission: Ceftriaxone  8/27>>  Vanc 8/28>>  Dose adjustments this admission:   Microbiology results: 8/26 UCX: >100 K GNRs >>providencia stuartii 8/27 BCX with 4/4 MRSE 8/28 BCX: GPC/clus    Thank you for allowing pharmacy to be a part of this patient's care.   [employer], Pharm.D., BCPS Clinical  Pharmacist  **Pharmacist phone directory can be found on [email or web address] listed under [facility].  03/28/2024 7:12 PM

## 2024-03-28 NOTE — Plan of Care (Signed)
  Problem: Education: Goal: Knowledge of General Education information will improve Description: Including pain rating scale, medication(s)/side effects and non-pharmacologic comfort measures 03/28/2024 1647 by Humphrey Brewer, RN Outcome: Progressing 03/28/2024 1647 by Humphrey Brewer, RN Outcome: Progressing   Problem: Health Behavior/Discharge Planning: Goal: Ability to manage health-related needs will improve 03/28/2024 1647 by Humphrey Brewer, RN Outcome: Progressing 03/28/2024 1647 by Humphrey Brewer, RN Outcome: Progressing   Problem: Clinical Measurements: Goal: Ability to maintain clinical measurements within normal limits will improve 03/28/2024 1647 by Humphrey Brewer, RN Outcome: Progressing 03/28/2024 1647 by Humphrey Brewer, RN Outcome: Progressing Goal: Will remain free from infection 03/28/2024 1647 by Humphrey Brewer, RN Outcome: Progressing 03/28/2024 1647 by Humphrey Brewer, RN Outcome: Progressing Goal: Diagnostic test results will improve 03/28/2024 1647 by Humphrey Brewer, RN Outcome: Progressing 03/28/2024 1647 by Humphrey Brewer, RN Outcome: Progressing Goal: Respiratory complications will improve 03/28/2024 1647 by Humphrey Brewer, RN Outcome: Progressing 03/28/2024 1647 by Humphrey Brewer, RN Outcome: Progressing Goal: Cardiovascular complication will be avoided 03/28/2024 1647 by Humphrey Brewer, RN Outcome: Progressing 03/28/2024 1647 by Humphrey Brewer, RN Outcome: Progressing   Problem: Activity: Goal: Risk for activity intolerance will decrease 03/28/2024 1647 by Humphrey Brewer, RN Outcome: Progressing 03/28/2024 1647 by Humphrey Brewer, RN Outcome: Progressing   Problem: Nutrition: Goal: Adequate nutrition will be maintained 03/28/2024 1647 by Humphrey Brewer, RN Outcome: Progressing 03/28/2024 1647 by Humphrey Brewer, RN Outcome: Progressing   Problem: Coping: Goal: Level of anxiety will decrease 03/28/2024 1647 by Humphrey Brewer, RN Outcome: Progressing 03/28/2024 1647 by Humphrey Brewer, RN Outcome: Progressing   Problem: Elimination: Goal: Will not experience complications related to bowel motility 03/28/2024 1647 by Humphrey Brewer, RN Outcome: Progressing 03/28/2024 1647 by Humphrey Brewer, RN Outcome: Progressing Goal: Will not experience complications related to urinary retention 03/28/2024 1647 by Humphrey Brewer, RN Outcome: Progressing 03/28/2024 1647 by Humphrey Brewer, RN Outcome: Progressing   Problem: Pain Managment: Goal: General experience of comfort will improve and/or be controlled 03/28/2024 1647 by Humphrey Brewer, RN Outcome: Progressing 03/28/2024 1647 by Humphrey Brewer, RN Outcome: Progressing   Problem: Safety: Goal: Ability to remain free from injury will improve 03/28/2024 1647 by Humphrey Brewer, RN Outcome: Progressing 03/28/2024 1647 by Humphrey Brewer, RN Outcome: Progressing   Problem: Skin Integrity: Goal: Risk for impaired skin integrity will decrease 03/28/2024 1647 by Humphrey Brewer, RN Outcome: Progressing 03/28/2024 1647 by Humphrey Brewer, RN Outcome: Progressing

## 2024-03-29 ENCOUNTER — Telehealth (HOSPITAL_BASED_OUTPATIENT_CLINIC_OR_DEPARTMENT_OTHER): Payer: Self-pay | Admitting: *Deleted

## 2024-03-29 DIAGNOSIS — R7881 Bacteremia: Secondary | ICD-10-CM | POA: Diagnosis not present

## 2024-03-29 DIAGNOSIS — Z931 Gastrostomy status: Secondary | ICD-10-CM | POA: Diagnosis not present

## 2024-03-29 LAB — VANCOMYCIN, RANDOM: Vancomycin Rm: 27 ug/mL

## 2024-03-29 LAB — GLUCOSE, RANDOM
Glucose, Bld: 85 mg/dL (ref 70–99)
Glucose, Bld: 114 mg/dL — ABNORMAL HIGH (ref 70–99)

## 2024-03-29 MED ORDER — PANCRELIPASE (LIP-PROT-AMYL) 10440-39150 UNITS PO TABS
20880.0000 [IU] | ORAL_TABLET | Freq: Once | ORAL | Status: AC
  Administered 2024-03-29: 20880 [IU]
  Filled 2024-03-29: qty 2

## 2024-03-29 MED ORDER — SODIUM BICARBONATE 650 MG PO TABS
650.0000 mg | ORAL_TABLET | Freq: Once | ORAL | Status: AC
  Administered 2024-03-29: 650 mg
  Filled 2024-03-29: qty 1

## 2024-03-29 MED ORDER — DAPTOMYCIN-SODIUM CHLORIDE 700-0.9 MG/100ML-% IV SOLN
700.0000 mg | Freq: Every day | INTRAVENOUS | Status: DC
  Administered 2024-03-30: 700 mg via INTRAVENOUS
  Filled 2024-03-29: qty 100

## 2024-03-29 NOTE — Plan of Care (Signed)

## 2024-03-29 NOTE — Assessment & Plan Note (Addendum)
 H/o chronic dysfunction. She is NPO, all nutrition and meds via tube - Reportedly tube was clogged last night, but patent this morning - RD consulted, recs as follows:  - Feeding rate 65 mL/h. 1.5 kcal Osmolite, 1000 mL continuous. Hold between 9 AM-12 PM and 9 PM-12 AM. - NPO at midnight - OT consult for speech board communication - Consult IR if tube needs replacement.

## 2024-03-29 NOTE — Progress Notes (Signed)
 Pharmacy Antibiotic Note  Kathleen Jensen is a 73 y.o. female admitted on 03/25/2024 with MRSE bacteremia in the setting of pacemaker.  TEE pending.  Pharmacy has been consulted to switch Vancomycin  to Daptomycin  to cover both staph epi (+mecA) and CONS.  Today is abx day #5.  Patient's renal function has remain stable / improved since the start of Vancomycin .    Vancomycin  random level down to 27 mcg/ml (23 hours past last Vanc level of 54 mcg/ml).  Calculated Ke 0.03, t1/2 ~23 hrs. Using this Ke, vanc level should be ~17 mcg/ml on 9/2 ~1000  ID contacted today and plan to change Vancomycin  to Daptomycin .  Plan: Will start Daptomycin  700mg  (~6mg /kg) daily starting tomorrow. BMET in a.m. CK weekly while on Daptomycin   Height: 5' 10 (177.8 cm) Weight: 107.5 kg (237 lb) IBW/kg (Calculated) : 68.5  Temp (24hrs), Avg:98 F (36.7 C), Min:97.6 F (36.4 C), Max:98.4 F (36.9 C)  Recent Labs  Lab 03/23/24 2219 03/23/24 2322 03/25/24 0512 03/25/24 0544 03/26/24 0630 03/27/24 0430 03/27/24 1519 03/28/24 1449 03/28/24 1729 03/29/24 1635  WBC 9.2  --  5.2  --  6.7  --   --   --   --   --   CREATININE  --  0.87  --  1.03* 0.93 0.72 0.77  --   --   --   LATICACIDVEN  --   --  1.9  --   --   --   --   --   --   --   VANCOPEAK  --   --   --   --   --   --   --  >60* 54*  --   VANCORANDOM  --   --   --   --   --   --   --   --   --  27    Estimated Creatinine Clearance: 83.2 mL/min (by C-G formula based on SCr of 0.77 mg/dL).    Allergies  Allergen Reactions   Tape Rash   Demerol [Meperidine Hcl] Nausea And Vomiting   Sulfonamide Derivatives Itching   Bacitracin-Polymyxin B Dermatitis    Not listed on the [facility]   Oxycontin  [Oxycodone ] Itching    Antimicrobials this admission: Ceftriaxone  8/27>>  Vanc 8/28>>9/2 Dapto 9/2 >>  Microbiology results: 8/26 UCX: >100 K GNRs >>providencia stuartii 8/27 BCX with 4/4 MRSE on BCID (+mecA) but cx sensitivities show sensitive to  oxacillin 8/28 BCX: 1/4 CONS - ?contaminant   Thank you for allowing pharmacy to be a part of this patient's care.  Vito Ralph, PharmD, BCPS Please see amion for complete clinical pharmacist phone list 03/29/2024 5:56 PM

## 2024-03-29 NOTE — Anesthesia Preprocedure Evaluation (Signed)
 Anesthesia Evaluation    Reviewed: Allergy & Precautions, Patient's Chart, lab work & pertinent test results, reviewed documented beta blocker date and time   History of Anesthesia Complications Negative for: history of anesthetic complications  Airway        Dental   Pulmonary asthma , sleep apnea , former smoker          Cardiovascular hypertension, Pt. on home beta blockers and Pt. on medications + dysrhythmias (on amiodarone ) Atrial Fibrillation + pacemaker      Neuro/Psych Seizures -,  CVA (aphasia, right hemiplegia), Residual Symptoms  negative psych ROS   GI/Hepatic Neg liver ROS,GERD  Medicated,,  Endo/Other  diabetes, Type 2Hypothyroidism    Renal/GU Renal InsufficiencyRenal disease     Musculoskeletal  (+) Arthritis ,    Abdominal   Peds  Hematology  (+) Blood dyscrasia (Plt 90k)   Anesthesia Other Findings Bacteremia  Reproductive/Obstetrics                              Anesthesia Physical Anesthesia Plan  ASA: 3  Anesthesia Plan: MAC   Post-op Pain Management: Minimal or no pain anticipated   Induction:   PONV Risk Score and Plan: 2 and Treatment may vary due to age or medical condition and Propofol  infusion  Airway Management Planned: Natural Airway and Nasal Cannula  Additional Equipment: None  Intra-op Plan:   Post-operative Plan:   Informed Consent:   Plan Discussed with:   Anesthesia Plan Comments:         Anesthesia Quick Evaluation

## 2024-03-29 NOTE — Assessment & Plan Note (Addendum)
 CVA hx/HLD: ASA 81 mg daily, simvastatin  10 mg daily. Paroxysmal A-fib/s/p pacemaker: Continue Lopressor  25 mg BID, amiodarone  200 mg daily. HTN: losartan  25 mg daily. Peripheral edema: Torsemide  10 mg BID.  No reported history of CHF, per facility this is for edema. GERD: famotidine  40 mg daily. Seizures: valproic  acid 500 mg TID with meals, and additional 750 mg at bedtime. Keppra  1000 mg BID. Sacral wound: wound care consulted, flexiseal placed.

## 2024-03-29 NOTE — Telephone Encounter (Signed)
 Post ED Visit - Positive Culture Follow-up  Culture report reviewed by antimicrobial stewardship pharmacist: [facility] []  Rankin Dee, Pharm.D. []  Venetia Gully, Pharm.D., BCPS AQ-ID []  Garrel Crews, Pharm.D., BCPS []  Almarie Lunger, Pharm.D., BCPS []  [place], [address].D., BCPS, AAHIVP []  Rosaline Bihari, Pharm.D., BCPS, AAHIVP []  Vernell Meier, PharmD, BCPS []  Latanya Hint, PharmD, BCPS []  Donald Medley, PharmD, BCPS []  Rocky Bold, PharmD []  Dorothyann Alert, PharmD, BCPS [x]  Rankin Sams, PharmD  [facility] []  Rosaline Edison, PharmD []  Romona Bliss, PharmD []  Dolphus Roller, PharmD []  Veva Seip, Rph []  Vernell Daunt) Leonce, PharmD []  Eva Allis, PharmD []  Rosaline Millet, PharmD []  Iantha Batch, PharmD []  Arvin Gauss, PharmD []  Wanda Hasting, PharmD []  Ronal Rav, PharmD []  Rocky Slade, PharmD []  Bard Jeans, PharmD   Positive blood culture Pt currently admitted and being treated Kathleen Jensen 03/29/2024, 10:13 AM

## 2024-03-29 NOTE — Assessment & Plan Note (Addendum)
 Stable this AM. Original cultures positive for MRSE 8/27; patient with implanted cardiac device. Repeat cultures 8/28 1 positive for coagulase-negative, other NGTD, definitive identification and susceptibilities pending. - Continue Abx: vancomycin  (8/28 - ) course pending TEE tomorrow - Last dose CTX this afternoon (8/28 - 9/1). - TEE scheduled 9/2; Plan to re-involve GI if TEE scope unable to pass through esophagus. - Contact cardiology regarding TEE consent.

## 2024-03-29 NOTE — Assessment & Plan Note (Signed)
 VSS. Foley exchanged 8/28. - Last dose CTX x5 days today

## 2024-03-29 NOTE — Plan of Care (Signed)
 Spoke with Dr. Barnetta cardiology today, confirmed that they are acquiring the consent for the TEE procedure tomorrow as per PA Leontine Salen note 03-26-2024.

## 2024-03-29 NOTE — Progress Notes (Signed)
 Daily Progress Note Intern Pager: [phone]  Patient name: Kathleen Jensen Medical record number: [id number] Date of birth: 1951-02-28 Age: 73 y.o. Gender: female  Primary Care Provider: System, Provider Not In Consultants: ID, GI, cardiology Code Status: Full  Pt Overview and Major Events to Date:  8/28 - admitted to [facility]  Medical Decision Making: Kathleen Jensen is a 73 year old admitted for bacteremia found to have pyelonephritis.  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  Bacteremia likely secondary to pyelonephritis but possibly from sacral wound, clinically improving stable vital signs last 2 days. Assessment & Plan Bacteremia Stable this AM. Original cultures positive for MRSE 8/27; patient with implanted cardiac device. Repeat cultures 8/28 1 positive for coagulase-negative, other NGTD, definitive identification and susceptibilities pending. - Continue Abx: vancomycin  (8/28 - ) course pending TEE tomorrow - Last dose CTX this afternoon (8/28 - 9/1). - TEE scheduled 9/2; Plan to re-involve GI if TEE scope unable to pass through esophagus. - Contact cardiology regarding TEE consent. Pyelonephritis VSS. Foley exchanged 8/28. - Last dose CTX x5 days today 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HCC) H/o chronic dysfunction. She is NPO, all nutrition and meds via tube - Reportedly tube was clogged last night, but patent this morning - RD consulted, recs as follows:  - Feeding rate 65 mL/h. 1.5 kcal Osmolite, 1000 mL continuous. Hold between 9 AM-12 PM and 9 PM-12 AM. - NPO at midnight - OT consult for speech board communication - Consult IR if tube needs replacement. Chronic health problem CVA hx/HLD: ASA 81 mg daily, simvastatin  10 mg daily. Paroxysmal A-fib/s/p pacemaker: Continue Lopressor  25 mg BID, amiodarone  200 mg  daily. HTN: losartan  25 mg daily. Peripheral edema: Torsemide  10 mg BID.  No reported history of CHF, per facility this is for edema. GERD: famotidine  40 mg daily. Seizures: valproic  acid 500 mg TID with meals, and additional 750 mg at bedtime. Keppra  1000 mg BID. Sacral wound: wound care consulted, flexiseal placed.  FEN/GI: N.p.o., oral meds nutrition via PEG, NPO at midnight PPx: Lovenox  Dispo:SNF tomorrow. Barriers include successful TEE 9/2 with no further workup.   Subjective:  Seen at bedside, at baseline mental/neurologic status, more alert and following commands, no acute events overnight, patient was alert this morning. Reports pain is moderately well-controlled, though difficult to understand from dysphagia.  Objective: Temp:  [97.6 F (36.4 C)-97.9 F (36.6 C)] 97.6 F (36.4 C) (09/01 0502) Pulse Rate:  [65-80] 80 (09/01 0502) Resp:  [18-20] 20 (09/01 0502) BP: (93-108)/(59-63) 108/59 (09/01 0502) SpO2:  [93 %-100 %] 100 % (09/01 0502) Weight:  [107.5 kg] 107.5 kg (09/01 0515)  Physical Exam: General: No acute distress, at baseline neurologic status Cardiovascular: Regular rate rhythm, no murmurs rubs gallops Respiratory: CTA bilaterally, no wheezes or crackles Abdomen: Nondistended, nontender, active bowel sounds Extremities: No peripheral edema or tenderness of legs  Laboratory: Most recent CBC Lab Results  Component Value Date   WBC 6.7 03/26/2024   HGB 12.7 03/26/2024   HCT 38.3 03/26/2024   MCV 109.1 (H) 03/26/2024   PLT 90 (L) 03/26/2024   Most recent BMP    Latest Ref Rng & Units 03/28/2024    5:29 PM  BMP  Glucose 70 - 99 mg/dL 892     Other pertinent labs vancomycin  peak 54 8/31  Lorrane Pac, MD 03/29/2024, 8:59 AM  PGY-1, [facility] FPTS Intern pager:  [phone], text pages welcome Secure chat group [facility] Teaching Service

## 2024-03-30 ENCOUNTER — Other Ambulatory Visit (HOSPITAL_COMMUNITY)

## 2024-03-30 ENCOUNTER — Encounter (HOSPITAL_COMMUNITY)
Admission: EM | Disposition: A | Discharge status: Skilled Nursing Facility | Payer: Self-pay | Source: Home / Self Care | Attending: Family Medicine

## 2024-03-30 ENCOUNTER — Encounter (HOSPITAL_COMMUNITY): Payer: Self-pay

## 2024-03-30 DIAGNOSIS — Z931 Gastrostomy status: Secondary | ICD-10-CM

## 2024-03-30 DIAGNOSIS — N12 Tubulo-interstitial nephritis, not specified as acute or chronic: Secondary | ICD-10-CM

## 2024-03-30 DIAGNOSIS — R7881 Bacteremia: Secondary | ICD-10-CM

## 2024-03-30 LAB — CULTURE, BLOOD (ROUTINE X 2)
Culture: NO GROWTH
Special Requests: ADEQUATE

## 2024-03-30 LAB — GLUCOSE, RANDOM: Glucose, Bld: 117 mg/dL — ABNORMAL HIGH (ref 70–99)

## 2024-03-30 SURGERY — TRANSESOPHAGEAL ECHOCARDIOGRAM (TEE) (CATHLAB)
Anesthesia: Monitor Anesthesia Care

## 2024-03-30 MED ORDER — CEFAZOLIN SODIUM-DEXTROSE 2-4 GM/100ML-% IV SOLN
2.0000 g | Freq: Three times a day (TID) | INTRAVENOUS | Status: DC
  Administered 2024-03-31 – 2024-04-06 (×20): 2 g via INTRAVENOUS
  Filled 2024-03-30 (×20): qty 100

## 2024-03-30 NOTE — Progress Notes (Signed)
   Progress Note   Date: 03/30/2024  Patient Name: Kathleen Jensen        MRN#: [id number]   Clarification of the diagnosis of pressure ulcer(s):   Sacral pressure ulcer was present on admission.

## 2024-03-30 NOTE — Assessment & Plan Note (Addendum)
 Stable this AM. Original cultures positive for MRSE 8/27; patient with implanted cardiac device. Repeat cultures 8/28 coagulase negative staph x 1 and NGTD for 5 days x 1. Completed 5 days CTX 9/1. - Continue Abx: vancomycin  (8/28 - ) course pending TEE today - TEE scheduled today but cardiology could not contact legal guardian; Plan to re-involve GI if TEE scope unable to pass through esophagus. -- follow up with legal guardian contact information for rescheduled TEE consent.

## 2024-03-30 NOTE — Evaluation (Signed)
 Occupational Therapy Evaluation Patient Details Name: Kathleen Jensen MRN: [id number] DOB: 29-Apr-1951 Today's Date: 03/30/2024   History of Present Illness   73 year old female presents from her facility due to positive blood cultures mrse in setting of presence of pacemaker. TEE pending. PMHx: htn, afib, asthma, dm2, osa, gerd, cva, seizure disorder, persistent right hemiparesis and aphasia, presence of g-tube     Clinical Impressions Kathleen Jensen was evaluated s/p the above admission list. She is from LTC SNF and per chart review requires total care at baseline. Upon evaluation the pt was limited by aphasia, R hemi with flexion contractures, LLE pain?, weakness and limited ability to move at bed level. Overall she required total A for rolling L&R attempts. Throughout session, attempted to use a YES/NO communication sign, pt often pointed to both answers. Due to the deficits listed below the pt also needs max-total A for ADLs at bed level. Recommend pt return to current level of care at LTC skilled inpatient follow up therapy, <3 hours/day.  Recommend consult to speech therapy acutely to address communication efforts.       Functional Status Assessment   Patient has had a recent decline in their functional status and/or demonstrates limited ability to make significant improvements in function in a reasonable and predictable amount of time     Equipment Recommendations   None recommended by OT     Recommendations for Other Services   Speech consult     Precautions/Restrictions   Precautions Precautions: Fall Precaution/Restrictions Comments: sacral wound, R hemi, g-tube, aphasia Restrictions Weight Bearing Restrictions Per Provider Order: No     Mobility Bed Mobility Overal bed mobility: Needs Assistance Bed Mobility: Rolling Rolling: Total assist         General bed mobility comments: unable to fully roll L&R with +1 total A    Transfers                   ADL either performed or assessed with clinical judgement   ADL Overall ADL's : Needs assistance/impaired Eating/Feeding: Set up;Bed level   Grooming: Minimal assistance;Bed level   Upper Body Bathing: Maximal assistance;Bed level   Lower Body Bathing: Total assistance   Upper Body Dressing : Total assistance   Lower Body Dressing: Total assistance   Toilet Transfer: Total assistance   Toileting- Clothing Manipulation and Hygiene: Total assistance       Functional mobility during ADLs: Total assistance General ADL Comments: pt is likely at her functional baseline     Vision   Vision Assessment?: No apparent visual deficits     Perception Perception: Not tested       Praxis Praxis: Not tested       Pertinent Vitals/Pain Pain Assessment Pain Assessment: Faces Faces Pain Scale: Hurts a little bit Pain Location: RLE with palpation and stomach? Pain Descriptors / Indicators: Grimacing, Guarding, Discomfort Pain Intervention(s): Monitored during session     Extremity/Trunk Assessment Upper Extremity Assessment Upper Extremity Assessment: LUE deficits/detail;RUE deficits/detail RUE Deficits / Details: flexion contractures throughout. Able to get wrist to neutral with increased time and stretching. LUE Deficits / Details: AAROM WFL, pt pointing and reaching with LUE. Does not always follow multimodal cues during extremity assessment LUE Sensation: WNL   Lower Extremity Assessment Lower Extremity Assessment: Defer to PT evaluation   Cervical / Trunk Assessment Cervical / Trunk Assessment: Other exceptions Cervical / Trunk Exceptions: body habitus & sacral wound   Communication Communication Communication: Impaired Factors Affecting Communication: Reduced clarity of speech;Difficulty expressing  self   Cognition Arousal: Alert   Cognition: Difficult to assess Difficult to assess due to: Impaired communication                              Following commands: Impaired Following commands impaired: Follows one step commands inconsistently     Cueing  General Comments   Cueing Techniques: Verbal cues;Gestural cues;Tactile cues  VSS           Home Living Family/patient expects to be discharged to:: Skilled nursing facility           Additional Comments: LTC at [facility]      Prior Functioning/Environment Prior Level of Function : Patient poor historian/Family not available             Mobility Comments: unsure of PLOF, per chart review pt is likely total care at bed level ADLs Comments: unsure of PLOF, per chart review pt is likely total care at bed level    OT Problem List: Decreased strength;Decreased range of motion;Decreased activity tolerance;Impaired balance (sitting and/or standing);Decreased cognition;Decreased safety awareness;Decreased knowledge of use of DME or AE;Decreased knowledge of precautions;Impaired UE functional use        OT Goals(Current goals can be found in the care plan section)   Acute Rehab OT Goals Patient Stated Goal: unable to state OT Goal Formulation: With patient Time For Goal Achievement: 03/30/24 Potential to Achieve Goals: Good   AM-PAC OT 6 Clicks Daily Activity     Outcome Measure Help from another person eating meals?: A Little Help from another person taking care of personal grooming?: A Little Help from another person toileting, which includes using toliet, bedpan, or urinal?: Total Help from another person bathing (including washing, rinsing, drying)?: Total Help from another person to put on and taking off regular upper body clothing?: Total Help from another person to put on and taking off regular lower body clothing?: Total 6 Click Score: 10   End of Session Nurse Communication: Mobility status  Activity Tolerance: Patient tolerated treatment well Patient left: in bed;with call bell/phone within reach  OT Visit Diagnosis: Muscle weakness  (generalized) (M62.81);Hemiplegia and hemiparesis Hemiplegia - Right/Left: Right Hemiplegia - caused by: Cerebral infarction                Time: 1455-1511 OT Time Calculation (min): 16 min Charges:  OT General Charges $OT Visit: 1 Visit OT Evaluation $OT Eval Moderate Complexity: 1 Mod  Lucie Kendall, OTR/L Acute Rehabilitation Services Office [phone] Secure Chat Communication Preferred   Lucie JONETTA Kendall 03/30/2024, 3:34 PM

## 2024-03-30 NOTE — Progress Notes (Addendum)
 Called the number on the chart for the legal guardian [phone]) however I was told that this was a wrong number. The person on the line declined to speak with me. The call was made in an attempt to obtain consent for the patient's procedure. At this point in time we will have to defer the procedure to later this week when we can obtain appropriate consent.

## 2024-03-30 NOTE — Assessment & Plan Note (Addendum)
 H/o chronic dysfunction. She is NPO, all nutrition and meds via tube - RD consulted, recs as follows:  - Feeding rate 65 mL/h. 1.5 kcal Osmolite, 1000 mL continuous. Hold between 9 AM-12 PM and 9 PM-12 AM. - NPO, can restart post procedure - Pending OT consult for speech board communication - Consult IR if tube needs replacement.

## 2024-03-30 NOTE — Assessment & Plan Note (Signed)
 CVA hx/HLD: ASA 81 mg daily, simvastatin  10 mg daily. Paroxysmal A-fib/s/p pacemaker: Continue Lopressor  25 mg BID, amiodarone  200 mg daily. HTN: losartan  25 mg daily. Peripheral edema: Torsemide  10 mg BID.  No reported history of CHF, per facility this is for edema. GERD: famotidine  40 mg daily. Seizures: valproic  acid 500 mg TID with meals, and additional 750 mg at bedtime. Keppra  1000 mg BID. Sacral wound: wound care consulted, flexiseal placed.

## 2024-03-30 NOTE — Plan of Care (Signed)

## 2024-03-30 NOTE — TOC Progression Note (Signed)
 Transition of Care [facility]) - Progression Note    Patient Details  Name: Kathleen Jensen MRN: [id number] Date of Birth: [birth year]  [facility]) CM/SW Contact  Lauraine FORBES Saa, LCSWA Phone Number: 03/30/2024, 10:08 AM  Clinical Narrative:     10:08 AM Per MD Resident, patient is expected to discharge to SNF tomorrow pending TEE. [facility] LTC admissions and bedside RN made aware. CSW will continue to follow and be available to assist.  Expected Discharge Plan: Long Term Nursing Home Barriers to Discharge: Continued Medical Work up               Expected Discharge Plan and Services In-house Referral: Clinical Social Work   Post Acute Care Choice: Skilled Nursing Facility Living arrangements for the past 2 months: Skilled Nursing Facility                                       Social Drivers of Health (SDOH) Interventions SDOH Screenings   Food Insecurity: Patient Unable To Answer (03/25/2024)  Housing: Unknown (03/25/2024)  Transportation Needs: Patient Unable To Answer (03/25/2024)  Utilities: Patient Unable To Answer (03/25/2024)  Depression (PHQ2-9): Low Risk  (05/22/2019)  Social Connections: Unknown (03/25/2024)  Tobacco Use: Medium Risk (03/25/2024)    Readmission Risk Interventions    02/18/2022    2:23 PM  Readmission Risk Prevention Plan  Transportation Screening Complete  PCP or Specialist Appt within 5-7 Days Complete  Home Care Screening Complete  Medication Review (RN CM) Complete

## 2024-03-30 NOTE — Assessment & Plan Note (Signed)
 VSS. Foley exchanged 8/28. - Last dose CTX x5 days yesterday

## 2024-03-30 NOTE — Progress Notes (Signed)
     Daily Progress Note Intern Pager: [phone]  Patient name: Kathleen Jensen Medical record number: [id number] Date of birth: 1950/11/20 Age: 73 y.o. Gender: female  Primary Care Provider: System, Provider Not In Consultants: Infectious disease, GI, cardiology Code Status: Full  Pt Overview and Major Events to Date:  8/28 - admitted to [facility] TS  Assessment & Plan Bacteremia Stable this AM. Original cultures positive for MRSE 8/27; patient with implanted cardiac device. Repeat cultures 8/28 coagulase negative staph x 1 and NGTD for 5 days x 1. Completed 5 days CTX 9/1. - Continue Abx: vancomycin  (8/28 - ) course pending TEE today - TEE scheduled today but cardiology could not contact legal guardian; Plan to re-involve GI if TEE scope unable to pass through esophagus. -- follow up with legal guardian contact information for rescheduled TEE consent. Pyelonephritis VSS. Foley exchanged 8/28. - Last dose CTX x5 days yesterday 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HCC) H/o chronic dysfunction. She is NPO, all nutrition and meds via tube - RD consulted, recs as follows:  - Feeding rate 65 mL/h. 1.5 kcal Osmolite, 1000 mL continuous. Hold between 9 AM-12 PM and 9 PM-12 AM. - NPO, can restart post procedure - Pending OT consult for speech board communication - Consult IR if tube needs replacement. Chronic health problem CVA hx/HLD: ASA 81 mg daily, simvastatin  10 mg daily. Paroxysmal A-fib/s/p pacemaker: Continue Lopressor  25 mg BID, amiodarone  200 mg daily. HTN: losartan  25 mg daily. Peripheral edema: Torsemide  10 mg BID.  No reported history of CHF, per facility this is for edema. GERD: famotidine  40 mg daily. Seizures: valproic  acid 500 mg TID with meals, and additional 750 mg at bedtime. Keppra  1000 mg BID. Sacral wound: wound care consulted, flexiseal placed.   FEN/GI: N.p.o. at midnight, restart oral meds/nutrition via PEG pending clinical improvement after  procedure PPx: Lovenox  Dispo:SNF in 2-3 days. Barriers include TEE.   Subjective:  Seen at bedside, no acute events overnight. At baseline neurologically and denies any pain. Difficult to get more given dysarthria 2/2 cva. Patient went to TEE procedure and cardiology could not contact Legal Guardian so procedure was delayed.  Objective: Temp:  [97.7 F (36.5 C)-98.4 F (36.9 C)] 97.7 F (36.5 C) (09/02 0422) Pulse Rate:  [53-68] 65 (09/02 0422) Resp:  [17-20] 17 (09/02 0422) BP: (98-120)/(61-87) 120/87 (09/02 0422) SpO2:  [95 %-100 %] 95 % (09/02 0422) Weight:  [107 kg] 107 kg (09/02 0500) Physical Exam: General: Resting comfortably Cardiovascular: Distant heart sounds RRR, no M/R/G Respiratory: CTA bilaterally no wheezes or crackles Abdomen: Nontender, active bowel sounds Extremities: No edema  Laboratory: Most recent CBC Lab Results  Component Value Date   WBC 6.7 03/26/2024   HGB 12.7 03/26/2024   HCT 38.3 03/26/2024   MCV 109.1 (H) 03/26/2024   PLT 90 (L) 03/26/2024   Most recent BMP    Latest Ref Rng & Units 03/29/2024    8:34 PM  BMP  Glucose 70 - 99 mg/dL 885     Imaging/Diagnostic Tests: Pending TEE today  Lorrane Pac, MD 03/30/2024, 7:27 AM  PGY-1, [place] [facility] FPTS Intern pager: [phone], text pages welcome Secure chat group [facility] Teaching Service

## 2024-03-30 NOTE — Plan of Care (Signed)
 Saw patient at bedside to update on plan in light of cancelled TEE. Informed that had difficulty contacting legal guardian and procedure will be rescheduled after consent is obtained. OT was present and we discussed options to improve communication barriers.

## 2024-03-30 NOTE — Progress Notes (Signed)
 Attempted to call [name]- legal guardian, no answer.  Her voice recording states could contact Tracy-supervisor, called her # [phone], no answer her recording states if need help can call Montie [phone].  Spoke with Montie, she states [name] is tied up with another client in the field.  She states she is not aware of the situation.  Informed her that procedure will have to be canceled until we are able to get consent for procedure, once obtained we can reschedule her.  Montie states she will follow up with [name] on situation.

## 2024-03-31 DIAGNOSIS — R7881 Bacteremia: Secondary | ICD-10-CM | POA: Diagnosis not present

## 2024-03-31 DIAGNOSIS — K9423 Gastrostomy malfunction: Secondary | ICD-10-CM

## 2024-03-31 LAB — BASIC METABOLIC PANEL WITH GFR
Anion gap: 7 (ref 5–15)
BUN: 27 mg/dL — ABNORMAL HIGH (ref 8–23)
CO2: 24 mmol/L (ref 22–32)
Calcium: 8.1 mg/dL — ABNORMAL LOW (ref 8.9–10.3)
Chloride: 105 mmol/L (ref 98–111)
Creatinine, Ser: 0.78 mg/dL (ref 0.44–1.00)
GFR, Estimated: >60 mL/min (ref >60)
Glucose, Bld: 126 mg/dL — ABNORMAL HIGH (ref 70–99)
Potassium: 4.5 mmol/L (ref 3.5–5.1)
Sodium: 136 mmol/L (ref 135–145)

## 2024-03-31 LAB — GLUCOSE, RANDOM: Glucose, Bld: 61 mg/dL — ABNORMAL LOW (ref 70–99)

## 2024-03-31 LAB — GLUCOSE, CAPILLARY: Glucose-Capillary: 77 mg/dL (ref 70–99)

## 2024-03-31 MED ORDER — ACETAMINOPHEN 160 MG/5ML PO SOLN: 650.0000 mg | Freq: Two times a day (BID) | ORAL | Status: AC

## 2024-03-31 MED ORDER — LEVETIRACETAM (KEPPRA) 500 MG/5 ML ADULT IV PUSH
1000.0000 mg | Freq: Once | INTRAVENOUS | Status: AC
  Administered 2024-03-31: 1000 mg via INTRAVENOUS
  Filled 2024-03-31: qty 10

## 2024-03-31 MED ORDER — ACETAMINOPHEN 10 MG/ML IV SOLN: 1000.0000 mg | Freq: Two times a day (BID) | INTRAVENOUS | Status: DC

## 2024-03-31 MED ORDER — VALPROATE SODIUM 100 MG/ML IV SOLN
750.0000 mg | Freq: Once | INTRAVENOUS | Status: AC
  Administered 2024-03-31: 750 mg via INTRAVENOUS
  Filled 2024-03-31: qty 7.5

## 2024-03-31 MED ORDER — ACETAMINOPHEN 10 MG/ML IV SOLN
1000.0000 mg | Freq: Two times a day (BID) | INTRAVENOUS | Status: AC
  Administered 2024-03-31: 1000 mg via INTRAVENOUS
  Filled 2024-03-31: qty 100

## 2024-03-31 MED ORDER — ACETAMINOPHEN 160 MG/5ML PO SOLN: 650.0000 mg | Freq: Two times a day (BID) | ORAL | Status: DC

## 2024-03-31 NOTE — Plan of Care (Signed)
 Cardiology had tried to contact the guardian yesterday and was able to speak with Montie the supervisor, who was going to get in contact with the guardian [name] since she was caught up with another patient.  Reached back out to social work this morning to see if there were any more communication with the supervisor/guardian per the Cardiology note, but no response.   Our team will attempt to get in touch with Montie or the guardian [name] as able today.   Kathrine Melena, DO 03/31/24 11:30 AM

## 2024-03-31 NOTE — Care Plan (Signed)
 This RN unable to flush peg tube. Provider notified

## 2024-03-31 NOTE — Progress Notes (Addendum)
   Complex patient consulted for TEE. Discussed last week with Dr. Raford who had recommended GI consult beforehand. GI felt no additional procedures needed prior to TEE therefore procedure formally scheduled. She is a ward of the state. [name] Search, patient's legal guardian, contacted cardiology to give consent for TEE. Delon Decent RN was present and witnessed telephone consent. Will place consent form in chart and will put in care order to stop tube feeds at midnight for scheduled TEE tomorrow.   See comprehensive note from 8/29 outlining risk/ benefit of TEE that was given to provide information for decision to proceed with TEE.   Bonney Leontine LOISE Garrick, PA-C  03/31/2024 4:57 PM

## 2024-03-31 NOTE — Plan of Care (Signed)

## 2024-03-31 NOTE — Progress Notes (Signed)
 Daily Progress Note Intern Pager: [phone]  Patient name: Kathleen Jensen Medical record number: [id number] Date of birth: 23-Apr-1951 Age: 73 y.o. Gender: female  Primary Care Provider: System, Provider Not In Consultants: Infectious disease, GI, cardiology Code Status: Full   Pt Overview and Major Events to Date:  8/28 - admitted to [facility] TS Assessment & Plan Bacteremia Stable this AM. Original cultures positive for MRSE 8/27; patient with implanted cardiac device. Repeat cultures 8/28 coagulase negative staph x 1 and NGTD for 5 days x 1. Completed 5 days CTX 9/1. - Continue Abx: vancomycin  (8/28 - ) course pending TEE today - TEE rescheduled, legal guardian could not be contacted.  Rescheduling pending on cardiology contacting legal guardian; Plan to re-involve GI if TEE scope unable to pass through esophagus. -- Will contact legal guardian contact to follow-up with miscommunication yesterday 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HCC) H/o chronic dysfunction. She is NPO, all nutrition and meds via tube - RD consulted, recs as follows:  - Feeding rate 65 mL/h. 1.5 kcal Osmolite, 1000 mL continuous. Hold between 9 AM-12 PM and 9 PM-12 AM. - OT consulted and recommended bed rolling and speech consult - Consult IR if tube needs replacement. Hypocalcemia BMP 8/30 and calcium  7.9, BMP 9/3 and calcium  8.1, no CMP this is hospitalization.  Given aphasia, difficult to discern if symptomatic - AM CMP tomorrow to check for albumin  correction Chronic health problem CVA hx/HLD: ASA 81 mg daily, simvastatin  10 mg daily. Paroxysmal A-fib/s/p pacemaker: Continue Lopressor  25 mg BID, amiodarone  200 mg daily. HTN: losartan  25 mg daily. Peripheral edema: Torsemide  10 mg BID.  No reported history of CHF, per facility this is for edema. GERD: famotidine  40 mg daily. Seizures: valproic  acid 500 mg TID with meals, and additional 750 mg at bedtime. Keppra  1000 mg BID. Sacral wound:  wound care consulted, flexiseal placed, nursing instructed on wound care  FEN/GI: N.p.o. all nutrition and medications via PEG tube PPx: Lovenox  Dispo:SNF in 2-3 days. Barriers include TEE.   Subjective:  Saw patient at bedside, was resting comfortably in bed and quite tired.  No acute distress, no acute events overnight.  No reported pain  Objective: Temp:  [97.7 F (36.5 C)-99.1 F (37.3 C)] 97.7 F (36.5 C) (09/03 0430) Pulse Rate:  [60-71] 60 (09/03 0430) Resp:  [17-18] 17 (09/03 0430) BP: (105-159)/(62-87) 105/62 (09/03 0430) SpO2:  [86 %-98 %] 96 % (09/03 0430) Physical Exam: General: Resting comfortably, intermittently sleeping Cardiovascular: Distant heart sounds, regular rate rhythm, no murmurs rubs or gallops Respiratory: Clear to auscultation bilaterally no wheezes or crackles Abdomen: Nontender, active bowel sounds Extremities: Nonedematous  Laboratory: Most recent CBC Lab Results  Component Value Date   WBC 6.7 03/26/2024   HGB 12.7 03/26/2024   HCT 38.3 03/26/2024   MCV 109.1 (H) 03/26/2024   PLT 90 (L) 03/26/2024   Most recent BMP    Latest Ref Rng & Units 03/31/2024    5:08 AM  BMP  Glucose 70 - 99 mg/dL 873   BUN 8 - 23 mg/dL 27   Creatinine 9.55 - 1.00 mg/dL 9.21   Sodium 864 - 854 mmol/L 136   Potassium 3.5 - 5.1 mmol/L 4.5   Chloride 98 - 111 mmol/L 105   CO2 22 - 32 mmol/L 24   Calcium  8.9 - 10.3 mg/dL 8.1      Lorrane Pac, MD 03/31/2024, 7:28 AM  PGY-1, [place] [facility] FPTS Intern pager: [phone], text pages welcome  Secure chat group [facility] Teaching Service

## 2024-03-31 NOTE — Assessment & Plan Note (Signed)
 CVA hx/HLD: ASA 81 mg daily, simvastatin  10 mg daily. Paroxysmal A-fib/s/p pacemaker: Continue Lopressor  25 mg BID, amiodarone  200 mg daily. HTN: losartan  25 mg daily. Peripheral edema: Torsemide  10 mg BID.  No reported history of CHF, per facility this is for edema. GERD: famotidine  40 mg daily. Seizures: valproic  acid 500 mg TID with meals, and additional 750 mg at bedtime. Keppra  1000 mg BID. Sacral wound: wound care consulted, flexiseal placed, nursing instructed on wound care

## 2024-03-31 NOTE — TOC Progression Note (Addendum)
 Transition of Care [facility]) - Progression Note    Patient Details  Name: Kathleen Jensen MRN: [id number] Date of Birth: 1950-10-23  [facility]) CM/SW Contact  Lauraine FORBES Saa, LCSWA Phone Number: 03/31/2024, 9:19 AM  Clinical Narrative:     9:19 AM Per MD Resident, patient cannot discharge until TEE and TEE results have been completed. Procedure to occur upon guardian consent as patient is not fully oriented. CSW relayed information to [facility] LTC admissions.  Expected Discharge Plan: Long Term Nursing Home Barriers to Discharge: Continued Medical Work up               Expected Discharge Plan and Services In-house Referral: Clinical Social Work   Post Acute Care Choice: Skilled Nursing Facility Living arrangements for the past 2 months: Skilled Nursing Facility                                       Social Drivers of Health (SDOH) Interventions SDOH Screenings   Food Insecurity: Patient Unable To Answer (03/25/2024)  Housing: Unknown (03/25/2024)  Transportation Needs: Patient Unable To Answer (03/25/2024)  Utilities: Patient Unable To Answer (03/25/2024)  Depression (PHQ2-9): Low Risk  (05/22/2019)  Social Connections: Unknown (03/25/2024)  Tobacco Use: Medium Risk (03/25/2024)    Readmission Risk Interventions    02/18/2022    2:23 PM  Readmission Risk Prevention Plan  Transportation Screening Complete  PCP or Specialist Appt within 5-7 Days Complete  Home Care Screening Complete  Medication Review (RN CM) Complete

## 2024-03-31 NOTE — Progress Notes (Signed)
 Lab tech reported that patient refused AM labs.

## 2024-03-31 NOTE — Assessment & Plan Note (Addendum)
 Stable this AM. Original cultures positive for MRSE 8/27; patient with implanted cardiac device. Repeat cultures 8/28 coagulase negative staph x 1 and NGTD for 5 days x 1. Completed 5 days CTX 9/1. - Continue Abx: vancomycin  (8/28 - ) course pending TEE today - TEE rescheduled, legal guardian could not be contacted.  Rescheduling pending on cardiology contacting legal guardian; Plan to re-involve GI if TEE scope unable to pass through esophagus. -- Will contact legal guardian contact to follow-up with miscommunication yesterday

## 2024-03-31 NOTE — Plan of Care (Addendum)
 Spoke with patient's legal guardian, Kathleen Jensen, about obtaining consent for TEE.  She stated she had just talked to Kathleen Jensen with cardiology minutes before.  She is given permission to proceed with TEE.  [name] asked me to update patient's family member.  I updated patient's daughter, Kathleen Jensen, on patient's status.  She will be by to see her mother soon.  She also asked that the speech-language pathologist provide recommendations at bedside for how she could work with her mother in terms of her speech at home.  I advised that our team will update her and the legal guardian after the TEE.  Also notified by RN that PEG tube with copious yellow drainage and difficulty with flushing. Positioning seems to be an issue. I evaluated the site, and there is a moderate amount of yellow drainage on a large gauze pad around the tube. There is a thicker green discharge around the stoma. Patient denied pain to me. No pain with manipulation of the tube. Tube may need to be exchanged. We have contacted on call IR and placed order for eval and management. They will see the patient. Greatly appreciate their assistance.

## 2024-03-31 NOTE — Assessment & Plan Note (Addendum)
 H/o chronic dysfunction. She is NPO, all nutrition and meds via tube - RD consulted, recs as follows:  - Feeding rate 65 mL/h. 1.5 kcal Osmolite, 1000 mL continuous. Hold between 9 AM-12 PM and 9 PM-12 AM. - OT consulted and recommended bed rolling and speech consult - Consult IR if tube needs replacement.

## 2024-03-31 NOTE — Progress Notes (Signed)
 Informed by lab tech that patient refused labs x 2 this morning

## 2024-03-31 NOTE — Assessment & Plan Note (Signed)
 BMP 8/30 and calcium  7.9, BMP 9/3 and calcium  8.1, no CMP this is hospitalization.  Given aphasia, difficult to discern if symptomatic - AM CMP tomorrow to check for albumin  correction

## 2024-04-01 ENCOUNTER — Inpatient Hospital Stay (HOSPITAL_COMMUNITY): Admitting: Anesthesiology

## 2024-04-01 ENCOUNTER — Inpatient Hospital Stay (HOSPITAL_COMMUNITY)

## 2024-04-01 ENCOUNTER — Encounter (HOSPITAL_COMMUNITY)
Admission: EM | Disposition: A | Discharge status: Skilled Nursing Facility | Payer: Self-pay | Source: Home / Self Care | Attending: Family Medicine

## 2024-04-01 ENCOUNTER — Encounter (HOSPITAL_COMMUNITY): Payer: Self-pay | Admitting: Student

## 2024-04-01 DIAGNOSIS — Z87891 Personal history of nicotine dependence: Secondary | ICD-10-CM | POA: Diagnosis not present

## 2024-04-01 DIAGNOSIS — I4891 Unspecified atrial fibrillation: Secondary | ICD-10-CM

## 2024-04-01 DIAGNOSIS — I38 Endocarditis, valve unspecified: Secondary | ICD-10-CM

## 2024-04-01 DIAGNOSIS — I679 Cerebrovascular disease, unspecified: Secondary | ICD-10-CM

## 2024-04-01 DIAGNOSIS — I10 Essential (primary) hypertension: Secondary | ICD-10-CM

## 2024-04-01 DIAGNOSIS — E162 Hypoglycemia, unspecified: Secondary | ICD-10-CM | POA: Insufficient documentation

## 2024-04-01 DIAGNOSIS — T827XXA Infection and inflammatory reaction due to other cardiac and vascular devices, implants and grafts, initial encounter: Secondary | ICD-10-CM | POA: Insufficient documentation

## 2024-04-01 DIAGNOSIS — T827XXD Infection and inflammatory reaction due to other cardiac and vascular devices, implants and grafts, subsequent encounter: Secondary | ICD-10-CM

## 2024-04-01 DIAGNOSIS — R7881 Bacteremia: Secondary | ICD-10-CM

## 2024-04-01 HISTORY — PX: TRANSESOPHAGEAL ECHOCARDIOGRAM (CATH LAB): EP1270

## 2024-04-01 LAB — CBC WITH DIFFERENTIAL/PLATELET
Abs Immature Granulocytes: 0.01 K/uL (ref 0.00–0.07)
Basophils Absolute: 0 K/uL (ref 0.0–0.1)
Basophils Relative: 0 %
Eosinophils Absolute: 0.3 K/uL (ref 0.0–0.5)
Eosinophils Relative: 3 %
HCT: 37.3 % (ref 36.0–46.0)
Hemoglobin: 12.7 g/dL (ref 12.0–15.0)
Immature Granulocytes: 0 %
Lymphocytes Relative: 43 %
Lymphs Abs: 3.5 K/uL (ref 0.7–4.0)
MCH: 36.8 pg — ABNORMAL HIGH (ref 26.0–34.0)
MCHC: 34 g/dL (ref 30.0–36.0)
MCV: 108.1 fL — ABNORMAL HIGH (ref 80.0–100.0)
Monocytes Absolute: 0.9 K/uL (ref 0.1–1.0)
Monocytes Relative: 11 %
Neutro Abs: 3.5 K/uL (ref 1.7–7.7)
Neutrophils Relative %: 43 %
Platelets: 105 K/uL — ABNORMAL LOW (ref 150–400)
RBC: 3.45 MIL/uL — ABNORMAL LOW (ref 3.87–5.11)
RDW: 12.6 % (ref 11.5–15.5)
WBC: 8.1 K/uL (ref 4.0–10.5)
nRBC: 0 % (ref 0.0–0.2)

## 2024-04-01 LAB — GLUCOSE, RANDOM
Glucose, Bld: 82 mg/dL (ref 70–99)
Glucose, Bld: 152 mg/dL — ABNORMAL HIGH (ref 70–99)

## 2024-04-01 LAB — ECHO TEE

## 2024-04-01 SURGERY — TRANSESOPHAGEAL ECHOCARDIOGRAM (TEE) (CATHLAB)
Anesthesia: Monitor Anesthesia Care

## 2024-04-01 MED ORDER — ACETAMINOPHEN 10 MG/ML IV SOLN
1000.0000 mg | Freq: Two times a day (BID) | INTRAVENOUS | Status: AC
  Administered 2024-04-01: 1000 mg via INTRAVENOUS
  Filled 2024-04-01 (×2): qty 100

## 2024-04-01 MED ORDER — FOLIC ACID 1 MG PO TABS
1000.0000 ug | ORAL_TABLET | Freq: Every day | ORAL | Status: DC
  Administered 2024-04-01 – 2024-04-06 (×6): 1 mg
  Filled 2024-04-01 (×6): qty 1

## 2024-04-01 MED ORDER — PROPOFOL 500 MG/50ML IV EMUL
INTRAVENOUS | Status: DC | PRN
  Administered 2024-04-01: 75 ug/kg/min via INTRAVENOUS

## 2024-04-01 MED ORDER — ACETAMINOPHEN 160 MG/5ML PO SOLN
650.0000 mg | Freq: Two times a day (BID) | ORAL | Status: AC
  Administered 2024-04-01: 650 mg
  Filled 2024-04-01: qty 20.3

## 2024-04-01 MED ORDER — SODIUM CHLORIDE 0.9 % IV SOLN: INTRAVENOUS | Status: DC

## 2024-04-01 MED ORDER — PROPOFOL 10 MG/ML IV BOLUS
INTRAVENOUS | Status: DC | PRN
  Administered 2024-04-01: 20 mg via INTRAVENOUS
  Administered 2024-04-01 (×2): 10 mg via INTRAVENOUS

## 2024-04-01 MED ORDER — CARMEX CLASSIC LIP BALM EX OINT
TOPICAL_OINTMENT | CUTANEOUS | Status: DC | PRN
  Filled 2024-04-01: qty 10

## 2024-04-01 MED ORDER — LIDOCAINE 2% (20 MG/ML) 5 ML SYRINGE
INTRAMUSCULAR | Status: DC | PRN
  Administered 2024-04-01: 40 mg via INTRAVENOUS

## 2024-04-01 NOTE — TOC Progression Note (Signed)
 Transition of Care [facility]) - Progression Note    Patient Details  Name: Kathleen Jensen MRN: [id number] Date of Birth: 1950/11/08  Transition of Care [facility]) CM/SW Contact  Lauraine FORBES Saa, LCSWA Phone Number: 04/01/2024, 2:01 PM  Clinical Narrative:     2:01 PM Per MD resident, patient's discharge to [facility] is pending Cardiology sign-off and PICC placement. CSW made SNF admissions aware. CSW will continue to follow and be available to assist.  Expected Discharge Plan: Long Term Nursing Home Barriers to Discharge: Continued Medical Work up               Expected Discharge Plan and Services In-house Referral: Clinical Social Work   Post Acute Care Choice: Skilled Nursing Facility Living arrangements for the past 2 months: Skilled Nursing Facility                                       Social Drivers of Health (SDOH) Interventions SDOH Screenings   Food Insecurity: Patient Unable To Answer (03/25/2024)  Housing: Unknown (03/25/2024)  Transportation Needs: Patient Unable To Answer (03/25/2024)  Utilities: Patient Unable To Answer (03/25/2024)  Depression (PHQ2-9): Low Risk  (05/22/2019)  Social Connections: Unknown (03/25/2024)  Tobacco Use: Medium Risk (04/01/2024)    Readmission Risk Interventions    02/18/2022    2:23 PM  Readmission Risk Prevention Plan  Transportation Screening Complete  PCP or Specialist Appt within 5-7 Days Complete  Home Care Screening Complete  Medication Review (RN CM) Complete

## 2024-04-01 NOTE — Interval H&P Note (Signed)
 History and Physical Interval Note:  04/01/2024 9:34 AM  Kathleen Jensen  has presented today for surgery, with the diagnosis of bacteremia, has PPM.  The various methods of treatment have been discussed with the patient and family. After consideration of risks, benefits and other options for treatment, the patient has consented to  Procedure(s): TRANSESOPHAGEAL ECHOCARDIOGRAM (N/A) as a surgical intervention.  The patient's history has been reviewed, patient examined, no change in status, stable for surgery.  I have reviewed the patient's chart and labs.  Questions were answered to the patient's satisfaction.     Cameron Schwinn Lonni

## 2024-04-01 NOTE — Transfer of Care (Signed)
 Immediate Anesthesia Transfer of Care Note  Patient: Kathleen Jensen  Procedure(s) Performed: TRANSESOPHAGEAL ECHOCARDIOGRAM  Patient Location: Cath Lab  Anesthesia Type:MAC  Level of Consciousness: awake  Airway & Oxygen Therapy: Patient Spontanous Breathing  Post-op Assessment: Report given to RN and Post -op Vital signs reviewed and stable  Post vital signs: Reviewed and stable  Last Vitals:  Vitals Value Taken Time  BP 101/81   Temp    Pulse 83 04/01/24 10:28  Resp 24 04/01/24 10:28  SpO2 91 % 04/01/24 10:28  Vitals shown include unfiled device data.  Last Pain:  Vitals:   04/01/24 0905  TempSrc: Temporal  PainSc:          Complications: There were no known notable events for this encounter.

## 2024-04-01 NOTE — Anesthesia Preprocedure Evaluation (Signed)
 Anesthesia Evaluation  Patient identified by MRN, date of birth, ID band Patient confused    Reviewed: Allergy & Precautions, H&P , NPO status , Patient's Chart, lab work & pertinent test results  Airway Mallampati: Unable to assess       Dental no notable dental hx. (+) Teeth Intact, Dental Advisory Given   Pulmonary asthma , sleep apnea , former smoker   Pulmonary exam normal breath sounds clear to auscultation       Cardiovascular hypertension, Pt. on medications and Pt. on home beta blockers  Rhythm:Regular Rate:Normal     Neuro/Psych Seizures -,  CVA, Residual Symptoms  negative psych ROS   GI/Hepatic Neg liver ROS,GERD  ,,  Endo/Other  diabetesHypothyroidism    Renal/GU Renal disease  negative genitourinary   Musculoskeletal  (+) Arthritis , Osteoarthritis,    Abdominal   Peds  Hematology  (+) Blood dyscrasia, anemia   Anesthesia Other Findings   Reproductive/Obstetrics negative OB ROS                              Anesthesia Physical Anesthesia Plan  ASA: 3  Anesthesia Plan: MAC   Post-op Pain Management: Minimal or no pain anticipated   Induction: Intravenous  PONV Risk Score and Plan: 2 and Propofol  infusion and Treatment may vary due to age or medical condition  Airway Management Planned: Natural Airway and Simple Face Mask  Additional Equipment:   Intra-op Plan:   Post-operative Plan:   Informed Consent: I have reviewed the patients History and Physical, chart, labs and discussed the procedure including the risks, benefits and alternatives for the proposed anesthesia with the patient or authorized representative who has indicated his/her understanding and acceptance.     Dental advisory given  Plan Discussed with: CRNA  Anesthesia Plan Comments:         Anesthesia Quick Evaluation

## 2024-04-01 NOTE — Assessment & Plan Note (Addendum)
 Stable this AM and afebrile. Original cultures positive for MRSE 8/27; patient with pacer for sick sinus syndrome.. Repeat cultures 8/28 coagulase negative staph x 1 and NGTD for 5 days x 1 eventually found to be Ancef  sensitive. Completed 5 days CTX 9/1.  - Vancomycin  (8/28 - 9/1) then transitioned to Daptomycin  9/1-9/2, then switched to Cefazolin  2g 9/3 - Continue Cefazolin  2 g every 8 hours (9/3-?) - TEE done today and found fibrinous strands, can resume tube feeds -- Pending ID recommendations for antibiotic course -- Pending Cardiology recommendations given TEE findings for pacer management

## 2024-04-01 NOTE — Assessment & Plan Note (Addendum)
 H/o chronic dysfunction. She is NPO, all nutrition and meds via GJ tube - RD consulted, recs as follows:  - Feeding rate 65 mL/h. 1.5 kcal Osmolite, 1000 mL continuous. Hold between 9 AM-12 PM and 9 PM-12 AM. - OT consulted and recommended bed rolling and speech consult -Will resume tube feeds post TEE -IR consulted given PEG tube malfunction and leaking yesterday evening, exam today found valve was clogged and tube is patent.  Appreciate all IR recommendations

## 2024-04-01 NOTE — Assessment & Plan Note (Addendum)
 Had a POC glucose 61 at 1927 9/3 and received 50 mL of D50 IV at 2215.  Was previously NPO for the cancelled TEE. Most likely due to PEG tube dysfunction with clogged valve which has since been fixed.  Repeat random glucose this a.m. was normal -D50 as needed for additional hypoglycemia -Continue random CBG twice daily.

## 2024-04-01 NOTE — CV Procedure (Signed)
    TRANSESOPHAGEAL ECHOCARDIOGRAM   NAME:  Kathleen Jensen   MRN: [id number] DOB:  26-Apr-1951   ADMIT DATE: 03/25/2024  INDICATIONS: Bacteremia  PROCEDURE:   Informed consent was obtained prior to the procedure. The risks, benefits and alternatives for the procedure were discussed and the patient comprehended these risks.  Risks include, but are not limited to, cough, sore throat, vomiting, nausea, somnolence, esophageal and stomach trauma or perforation, bleeding, low blood pressure, aspiration, pneumonia, infection, trauma to the teeth and death.    Procedural time out performed. Patient received monitored anesthesia care under the supervision of Dr. Epifanio. Patient received a total of 152 mg propofol  during the procedure.   The transesophageal probe was inserted in the esophagus and stomach without difficulty and multiple views were obtained.    COMPLICATIONS:    There were no immediate complications.  FINDINGS:  LEFT VENTRICLE: EF = 60-65%, with moderate LVH. No regional wall motion abnormalities.  RIGHT VENTRICLE: Normal size and function.   LEFT ATRIUM: No thrombus/mass.  LEFT ATRIAL APPENDAGE: No thrombus/mass.   RIGHT ATRIUM: Both RA and RV device leads have small fibrinous strands adhered.  AORTIC VALVE:  Trileaflet. Trivial regurgitation. No vegetation.  MITRAL VALVE:    Normal structure. Trivial regurgitation. No vegetation.  TRICUSPID VALVE: Normal structure. Trivial regurgitation. No vegetation.  PULMONIC VALVE: Grossly normal structure. Trivial regurgitation. No apparent vegetation.  INTERATRIAL SEPTUM: No PFO or ASD seen by color Doppler. Agitated saline contrast study was negative for intra-atrial shunt.  PERICARDIUM: No effusion noted.  DESCENDING AORTA: Focal moderate to severe plaque seen just distal to aortic arch   CONCLUSION: Fibrinous strands attached to both RA and RV leads in right atrium   Shelda Bruckner, MD, PhD,  [facility] [place]  [facility]  [place]  [facility] at [facility] [address], [address] [place], [place] [address] [phone]   10:27 AM

## 2024-04-01 NOTE — Plan of Care (Signed)
   Problem: Skin Integrity: Goal: Risk for impaired skin integrity will decrease Outcome: Progressing

## 2024-04-01 NOTE — Consult Note (Cosign Needed)
 ELECTROPHYSIOLOGY CONSULT NOTE    [name] MRN: [id number], DOB/AGE: [birth year] 73 y.o.  Admit date: 03/25/2024 Date of Consult: 04/01/2024  Primary Physician: System, Provider Not In Primary Cardiologist: None  Electrophysiologist: Followed at [facility]  Referring Provider: Dr. Donah  Patient Profile: Kathleen Jensen is a 73 y.o. 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 Donah.  HPI:  Kathleen Jensen is a 73 y.o. female who was admitted on 03/25/24 with MRSE bacteremia.    At baseline, she is followed in [facility] for [facility]iology.  She had a MDT dual chamber PPM implanted 11/04/2008 for SSS. Her most recent generator change was on 10/30/21. She is on amiodarone  200 mg daily at baseline.   She was seen in the ER on 03/23/24 for possible PEG tube dislodgment.  CT imaging was concerning for possible pyelonephritis.  She was discharged home and called back after cultures returned positive for MRSE.  She was admitted per FMTS for further care.  ID consulted.  Patient started on rocephin  and vancomycin . TEE was ordered to evaluate PPM for evidence. TEE on 04/01/24 showed filamentous structures on both atrial and ventricular leads as they pass through the RA. Urine culture growing providencia stuartii. Abx consolidated to cefazolin .     Labs Potassium4.5 (09/03 9491)   Creatinine, ser  0.78 (09/03 0508) PLT  105* (09/04 0726) HGB  12.7 (09/04 0726) WBC 8.1 (09/04 0726)  .    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 (HCC)    Hyperlipemia    Hypertension    Insomnia    Intracerebral hemorrhage, intraventricular (HCC)    Muscle weakness    Other abnormalities of gait and mobility    Seizures (HCC)    Stroke (HCC)    Unsteadiness on feet    Vitamin B deficiency      Surgical History:  Past Surgical History:  Procedure Laterality Date   BACK SURGERY     Dec 18, 2016 Dr.Torrealba   BREAST BIOPSY Right    BREAST EXCISIONAL BIOPSY     CERVICAL SPINE SURGERY     C4   CESAREAN SECTION     GASTRIC BYPASS  03/2007   IR GJ TUBE CHANGE  11/16/2023   IR REPLC DUODEN/JEJUNO TUBE PERCUT W/FLUORO  01/27/2023   IR REPLC DUODEN/JEJUNO TUBE PERCUT W/FLUORO  03/07/2023   IR REPLC DUODEN/JEJUNO TUBE PERCUT W/FLUORO  04/30/2023   IR REPLC DUODEN/JEJUNO TUBE PERCUT W/FLUORO  09/02/2023   IR REPLC DUODEN/JEJUNO TUBE PERCUT W/FLUORO  09/24/2023   TONSILLECTOMY       Medications Prior to Admission  Medication Sig Dispense Refill Last Dose/Taking   acetaminophen  (TYLENOL ) 160 MG/5ML solution Place 20 mLs (640 mg total) into feeding tube every 8 (eight) hours. (Patient taking differently: Place 800  mg into feeding tube every 6 (six) hours.) 120 mL 0 03/25/2024   acetic acid  0.25 % irrigation 20cc irrigation twice a day, flush foley to prevent clogging   03/24/2024   amiodarone  (PACERONE ) 200 MG tablet Place 1 tablet (200 mg total) into feeding tube daily.   03/22/2024   ascorbic acid  (VITAMIN C ) 500 MG tablet Place 0.5 tablets (250 mg total) into feeding tube 2 (two) times daily.   03/24/2024   aspirin  81 MG chewable tablet Place 81 mg into feeding tube at bedtime.   03/24/2024   chlorhexidine  (PERIDEX ) 0.12 % solution Dab a toothbrush into 15 mLs and brush teeth 2 times a day    03/24/2024   Cranberry 425 MG CAPS Take 425 mg by mouth in the morning and at bedtime.   03/24/2024   cyclobenzaprine  (FLEXERIL ) 5 MG tablet Place 2.5 mg into feeding tube in the morning and at bedtime. Hold If Sedated   03/24/2024   diazePAM , 20 MG Dose, (VALTOCO  20 MG DOSE) 2 x 10 MG/0.1ML LQPK Place 20 mg into the nose daily as needed (seizures).   Unknown   docusate sodium  (COLACE) 100 MG capsule Give 1 capsule per tube twice a day   03/24/2024   estradiol (ESTRACE) 0.1 MG/GM vaginal cream Place vaginally See admin instructions. Apply a pea-sized amount to the urethra/vaginal wall on Tuesdays and Fridays   03/23/2024   famotidine  (PEPCID ) 40 MG/5ML suspension Place 40 mg into feeding tube daily.   03/23/2024   folic acid  (FOLVITE ) 800 MCG tablet Place 800 mcg into feeding tube daily.   03/23/2024   HYDROcodone -acetaminophen  (NORCO/VICODIN) 5-325 MG tablet Place 1 tablet into feeding tube 2 (two) times daily as needed for moderate pain (pain score 4-6).   03/23/2024   lactulose  (CHRONULAC ) 10 GM/15ML solution Take 30 g by mouth daily.   03/23/2024   levETIRAcetam  (KEPPRA ) 100 MG/ML solution Place 1,000 mg into feeding tube 2 (two) times daily.   03/24/2024   losartan  (COZAAR ) 25 MG tablet Place 1 tablet (25 mg total) into feeding tube daily. (Patient taking differently: Place 25 mg into feeding tube daily. Hold for SBP <110)   03/23/2024   Menthol , Topical Analgesic, (BIOFREEZE) 4 % GEL Apply 2 g topically See admin instructions. Apply 2 grams topically to all limbs 2 times a day   03/24/2024   metoprolol  tartrate (LOPRESSOR ) 25 MG tablet Place 1 tablet (25 mg total) into feeding tube 2 (two) times daily.   03/24/2024   polyethylene glycol (MIRALAX  / GLYCOLAX ) 17 g packet Place 34 g into feeding tube daily.   03/23/2024   simethicone  (MYLICON) 80 MG chewable tablet Place 80 mg into feeding tube 3 (three) times daily.   03/24/2024   simvastatin  (ZOCOR ) 10 MG tablet Place 1 tablet (10 mg total) into feeding tube  at bedtime.   03/24/2024   torsemide  (DEMADEX ) 10 MG tablet Give 10 mg per tube every 12 hours for edema, hold for a systolic reading less than 110   03/24/2024   valproic  acid (DEPAKENE ) 250 MG/5ML solution Place 500-750 mg into feeding tube See admin instructions. 500 mg via tube three times a day and 750 mg at bedtime   03/24/2024    Inpatient Medications:   acetaminophen   650 mg Per Tube BID   acetaminophen   650 mg Per Tube BID   amiodarone   200 mg Per Tube Daily   aspirin   81 mg Per Tube QHS   Chlorhexidine  Gluconate Cloth  6 each Topical  Daily   cyclobenzaprine   2.5 mg Per Tube BID   enoxaparin  (LOVENOX ) injection  50 mg Subcutaneous Q24H   famotidine   40 mg Per Tube Daily   feeding supplement (OSMOLITE 1.5 CAL)  1,000 mL Per Tube Q24H   And   feeding supplement (OSMOLITE 1.5 CAL)  1,000 mL Per Tube Q24H   feeding supplement (PROSource TF20)  60 mL Per Tube BID   free water   150 mL Per Tube Q4H   lactulose   30 g Per Tube Daily   levETIRAcetam   1,000 mg Per Tube BID   losartan   25 mg Per Tube Daily   metoprolol  tartrate  25 mg Per Tube BID   multivitamin with minerals  1 tablet Per Tube Daily   polyethylene glycol  34 g Per Tube Daily   simethicone   80 mg Per Tube TID   simvastatin   10 mg Per Tube QHS   torsemide   10 mg Per Tube Q12H   valproic  acid  500 mg Per Tube TID WC   And   valproic  acid  750 mg Per Tube QHS    Allergies:  Allergies  Allergen Reactions   Tape Rash   Demerol [Meperidine Hcl] Nausea And Vomiting   Sulfonamide Derivatives Itching   Bacitracin-Polymyxin B Dermatitis    Not listed on the [facility]   Oxycontin  [Oxycodone ] Itching    Family History  Problem Relation Age of Onset   High blood pressure Mother    Diabetes Mother    Diabetes Father    Stroke Father    Cancer Sister        Colorectal cancer   Cancer Brother        colon     Physical Exam: Vitals:   04/01/24 1110 04/01/24 1115 04/01/24 1120 04/01/24 1314  BP: (!) 101/58 (!) 112/96  100/67 (!) 113/43  Pulse: 75 74 80 72  Resp: 15 12 (!) 22 19  Temp:    97.8 F (36.6 C)  TempSrc:    Oral  SpO2: 98% 94% 94% 97%  Weight:      Height:        GEN- NAD, A&O x 3, normal affect HEENT: Normocephalic, atraumatic Lungs- CTAB, Normal effort.  Heart- Irregularly irregular rate and rhythm, No M/G/R.  GI- Soft, NT, ND.  Extremities- No clubbing, cyanosis, or edema   Radiology/Studies: EP STUDY Result Date: 04/01/2024 See surgical note for result.  CT ABDOMEN PELVIS W CONTRAST Result Date: 03/24/2024 CLINICAL DATA:  Acute abdominal pain EXAM: CT ABDOMEN AND PELVIS WITH CONTRAST TECHNIQUE: Multidetector CT imaging of the abdomen and pelvis was performed using the standard protocol following bolus administration of intravenous contrast. RADIATION DOSE REDUCTION: This exam was performed according to the departmental dose-optimization program which includes automated exposure control, adjustment of the mA and/or kV according to patient size and/or use of iterative reconstruction technique. CONTRAST:  75mL OMNIPAQUE  IOHEXOL  350 MG/ML SOLN COMPARISON:  07/14/2023 FINDINGS: Lower chest: No acute abnormality. Hepatobiliary: No focal liver abnormality is seen. Status post cholecystectomy. No biliary dilatation. Pancreas: Unremarkable. No pancreatic ductal dilatation or surrounding inflammatory changes. Spleen: Normal in size without focal abnormality. Adrenals/Urinary Tract: Adrenal glands are within normal limits. Left renal cyst is noted simple in nature. No follow-up is recommended. Some patchy areas of decreased enhancement are noted within the right kidney suspicious for pyelonephritis. No obstructive changes are seen. The bladder is decompressed by Foley catheter. Stomach/Bowel: Jejunostomy tube is noted in place. Stomach is within normal limits. Visualized small  bowel is unremarkable. Mild gaseous distension of the colon is seen without obstructive process. Postsurgical changes are noted in  the right lower quadrant likely related to prior appendectomy. Vascular/Lymphatic: Aortic atherosclerosis. No enlarged abdominal or pelvic lymph nodes. Reproductive: Multiple calcified uterine fibroids are noted. Other: No abdominal wall hernia or abnormality. No abdominopelvic ascites. Musculoskeletal: Postsurgical changes in the lower lumbar spine are noted. IMPRESSION: Patchy enhancement within the right kidney which persists on delayed images suspicious for pyelonephritis. Chronic changes as described above. Electronically Signed   By: Oneil Devonshire M.D.   On: 03/24/2024 01:51    EKG: 03/23/24 AF with controlled ventricular rates (personally reviewed)  TELEMETRY: not on tele (personally reviewed)  DEVICE HISTORY:  MDS Azure XT DR MRI Dual Chamber PPM, 5076 leads  TEE 04/01/24 > LVEF 60-65%, mod LVH, no RWMA, no PFO / ASD, fibrinous strands attached to both RA and RV leads in RA  Assessment/Plan:  CIED Infection in setting of MRSE Bacteremia  Multiple possible sources infection, BC positive 4/4 for coag neg staph. TEE 04/01/24 showed filamentous structures on both atrial and ventricular leads in the RA.    -continue abx per ID > consolidated to cefazolin   -will review with MD for candidacy for extraction vs lifetime suppressive antibiotics given multiple co-morbidities. Given patients baseline medical issues, anticipate risks of extraction are much greater than clinical benefit    For questions or updates, please contact [facility] Please consult [email or web address] for contact info under     Signed, Daphne Barrack, NP-C, AGACNP-BC [facility] - Electrophysiology  04/01/2024, 2:06 PM

## 2024-04-01 NOTE — Progress Notes (Signed)
 [facility]  Date of Admission:  03/25/2024      Total days of antibiotics 7   Cefazolin  9/02 >> c          ASSESSMENT: Kathleen Jensen is a 73 y.o. female   MSSE Bacteremia, 4/4 bottles -  BCx (+) 8/26, (-) 8/27 (coag neg staph that was not able to be ID'd fully but not MSSE). TEE done today and reveals concern over filamentous structures on both atrial and ventricular leads in the RA. D/W EP team and likely will plan to do 6 week course of IV treatment then attempt lifelong suppression with cefadroxil.  Will await formal EP consult and recommendations before we order PICC line.  Continue cefazolin    S/P CVA -  Expressive aphasia. Intermittent confusion.   Disposition -  From [name]     PLAN: Continue cefazolin  IV  Will await full EP consult to ensure no extraction planned before placing PICC    Principal Problem:   Bacteremia Active Problems:   Preoperative clearance   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HCC)   Sacral wound   Chronic health problem   Hypocalcemia   PEG tube malfunction (HCC)   Hypoglycemia   Infected pacemaker, initial encounter (HCC)    acetaminophen   650 mg Per Tube BID   acetaminophen   650 mg Per Tube BID   amiodarone   200 mg Per Tube Daily   aspirin   81 mg Per Tube QHS   Chlorhexidine  Gluconate Cloth  6 each Topical Daily   cyclobenzaprine   2.5 mg Per Tube BID   enoxaparin  (LOVENOX ) injection  50 mg Subcutaneous Q24H   famotidine   40 mg Per Tube Daily   feeding supplement (OSMOLITE 1.5 CAL)  1,000 mL Per Tube Q24H   And   feeding supplement (OSMOLITE 1.5 CAL)  1,000 mL Per Tube Q24H   feeding supplement (PROSource TF20)  60 mL Per Tube BID   free water   150 mL Per Tube Q4H   lactulose   30 g Per Tube Daily   levETIRAcetam   1,000 mg Per Tube BID   losartan   25 mg Per Tube Daily   metoprolol  tartrate  25 mg Per Tube BID   multivitamin with minerals  1 tablet Per Tube  Daily   polyethylene glycol  34 g Per Tube Daily   simethicone   80 mg Per Tube TID   simvastatin   10 mg Per Tube QHS   torsemide   10 mg Per Tube Q12H   valproic  acid  500 mg Per Tube TID WC   And   valproic  acid  750 mg Per Tube QHS    SUBJECTIVE: Confused.   Review of Systems: ROS  Allergies  Allergen Reactions   Tape Rash   Demerol [Meperidine Hcl] Nausea And Vomiting   Sulfonamide Derivatives Itching   Bacitracin-Polymyxin B Dermatitis    Not listed on the [facility]   Oxycontin  [Oxycodone ] Itching    OBJECTIVE: Vitals:   04/01/24 1110 04/01/24 1115 04/01/24 1120 04/01/24 1314  BP: (!) 101/58 (!) 112/96 100/67 (!) 113/43  Pulse: 75 74 80 72  Resp: 15 12 (!) 22 19  Temp:    97.8 F (36.6 C)  TempSrc:    Oral  SpO2: 98% 94% 94% 97%  Weight:      Height:       Body mass index is 33.86 kg/m.  Physical Exam Constitutional:      Appearance:  Normal appearance. She is obese. She is not ill-appearing.  Cardiovascular:     Rate and Rhythm: Normal rate.  Pulmonary:     Effort: Pulmonary effort is normal.  Abdominal:     General: Bowel sounds are normal.     Palpations: Abdomen is soft.  Skin:    General: Skin is warm and dry.     Findings: No rash.     Lab Results Lab Results  Component Value Date   WBC 8.1 04/01/2024   HGB 12.7 04/01/2024   HCT 37.3 04/01/2024   MCV 108.1 (H) 04/01/2024   PLT 105 (L) 04/01/2024    Lab Results  Component Value Date   CREATININE 0.78 03/31/2024   BUN 27 (H) 03/31/2024   NA 136 03/31/2024   K 4.5 03/31/2024   CL 105 03/31/2024   CO2 24 03/31/2024    Lab Results  Component Value Date   ALT 22 03/23/2024   AST 49 (H) 03/23/2024   ALKPHOS 141 (H) 03/23/2024   BILITOT 0.5 03/23/2024     Microbiology: Recent Results (from the past 240 hours)  Urine Culture     Status: Abnormal   Collection Time: 03/23/24  7:06 PM   Specimen: Urine, Clean Catch  Result Value Ref Range Status   Specimen Description URINE, CLEAN CATCH   Final   Special Requests   Final    NONE Performed at [facility], [address] [place], [place] [address]    Culture >=100,000 COLONIES/mL PROVIDENCIA STUARTII (A)  Final   Report Status 03/26/2024 FINAL  Final   Organism ID, Bacteria PROVIDENCIA STUARTII (A)  Final      Susceptibility   Providencia stuartii - MIC*    AMPICILLIN  RESISTANT Resistant     CEFEPIME  <=0.12 SENSITIVE Sensitive     ERTAPENEM <=0.12 SENSITIVE Sensitive     CEFTRIAXONE  <=0.25 SENSITIVE Sensitive     CIPROFLOXACIN >=4 RESISTANT Resistant     GENTAMICIN RESISTANT Resistant     NITROFURANTOIN 256 RESISTANT Resistant     TRIMETH /SULFA  <=20 SENSITIVE Sensitive     AMPICILLIN /SULBACTAM 16 INTERMEDIATE Intermediate     PIP/TAZO Value in next row Sensitive ug/mL     <=4 SENSITIVEThis is a modified FDA-approved test that has been validated and its performance characteristics determined by the reporting laboratory.  This laboratory is certified under the Clinical Laboratory Improvement Amendments CLIA as qualified to perform high complexity clinical laboratory testing.    MEROPENEM Value in next row Sensitive      <=4 SENSITIVEThis is a modified FDA-approved test that has been validated and its performance characteristics determined by the reporting laboratory.  This laboratory is certified under the Clinical Laboratory Improvement Amendments CLIA as qualified to perform high complexity clinical laboratory testing.    * >=100,000 COLONIES/mL PROVIDENCIA STUARTII  Blood culture (routine x 2)     Status: Abnormal   Collection Time: 03/24/24  5:15 AM   Specimen: BLOOD  Result Value Ref Range Status   Specimen Description BLOOD LEFT ANTECUBITAL  Final   Special Requests   Final    BOTTLES DRAWN AEROBIC AND ANAEROBIC Blood Culture adequate volume   Culture  Setup Time   Final    GRAM POSITIVE COCCI IN CLUSTERS IN BOTH AEROBIC AND ANAEROBIC BOTTLES CRITICAL VALUE NOTED.  VALUE IS CONSISTENT WITH PREVIOUSLY  REPORTED AND CALLED VALUE. Performed at [facility], [address] [place], [place] [address]    Culture STAPHYLOCOCCUS EPIDERMIDIS (A)  Final  Report Status 03/28/2024 FINAL  Final   Organism ID, Bacteria STAPHYLOCOCCUS EPIDERMIDIS  Final      Susceptibility   Staphylococcus epidermidis - MIC*    CIPROFLOXACIN <=0.5 SENSITIVE Sensitive     ERYTHROMYCIN >=8 RESISTANT Resistant     GENTAMICIN <=0.5 SENSITIVE Sensitive     OXACILLIN <=0.25 SENSITIVE Sensitive     TETRACYCLINE 2 SENSITIVE Sensitive     VANCOMYCIN  1 SENSITIVE Sensitive     TRIMETH /SULFA  <=10 SENSITIVE Sensitive     CLINDAMYCIN >=8 RESISTANT Resistant     RIFAMPIN <=0.5 SENSITIVE Sensitive     Inducible Clindamycin NEGATIVE Sensitive     * STAPHYLOCOCCUS EPIDERMIDIS  Blood culture (routine x 2)     Status: Abnormal   Collection Time: 03/24/24  5:25 AM   Specimen: BLOOD RIGHT HAND  Result Value Ref Range Status   Specimen Description BLOOD RIGHT HAND  Final   Special Requests   Final    BOTTLES DRAWN AEROBIC AND ANAEROBIC Blood Culture results may not be optimal due to an inadequate volume of blood received in culture bottles   Culture  Setup Time   Final    GRAM POSITIVE COCCI IN CLUSTERS IN BOTH AEROBIC AND ANAEROBIC BOTTLES CRITICAL RESULT CALLED TO, READ BACK BY AND VERIFIED WITH: RN LAYMON MALVA SANDERSON [id number] FCP Performed at [facility], [address] [place], [place] [address]    Culture STAPHYLOCOCCUS EPIDERMIDIS (A)  Final   Report Status 03/28/2024 FINAL  Final   Organism ID, Bacteria STAPHYLOCOCCUS EPIDERMIDIS  Final      Susceptibility   Staphylococcus epidermidis - MIC*    CIPROFLOXACIN <=0.5 SENSITIVE Sensitive     ERYTHROMYCIN >=8 RESISTANT Resistant     GENTAMICIN <=0.5 SENSITIVE Sensitive     OXACILLIN <=0.25 SENSITIVE Sensitive     TETRACYCLINE 2 SENSITIVE Sensitive     VANCOMYCIN  2 SENSITIVE Sensitive     TRIMETH /SULFA  <=10 SENSITIVE Sensitive     CLINDAMYCIN >=8 RESISTANT  Resistant     RIFAMPIN <=0.5 SENSITIVE Sensitive     Inducible Clindamycin NEGATIVE Sensitive     * STAPHYLOCOCCUS EPIDERMIDIS  Blood Culture ID Panel (Reflexed)     Status: Abnormal   Collection Time: 03/24/24  5:25 AM  Result Value Ref Range Status   Enterococcus faecalis NOT DETECTED NOT DETECTED Final   Enterococcus Faecium NOT DETECTED NOT DETECTED Final   Listeria monocytogenes NOT DETECTED NOT DETECTED Final   Staphylococcus species DETECTED (A) NOT DETECTED Final    Comment: CRITICAL RESULT CALLED TO, READ BACK BY AND VERIFIED WITH: RN BRITTANY O [phone] [id number] FCP    Staphylococcus aureus (BCID) NOT DETECTED NOT DETECTED Final   Staphylococcus epidermidis DETECTED (A) NOT DETECTED Final    Comment: Methicillin (oxacillin) resistant coagulase negative staphylococcus. Possible blood culture contaminant (unless isolated from more than one blood culture draw or clinical case suggests pathogenicity). No antibiotic treatment is indicated for blood  culture contaminants. CRITICAL RESULT CALLED TO, READ BACK BY AND VERIFIED WITH: RN LAYMON MALVA [phone] [id number] FCP    Staphylococcus lugdunensis NOT DETECTED NOT DETECTED Final   Streptococcus species NOT DETECTED NOT DETECTED Final   Streptococcus agalactiae NOT DETECTED NOT DETECTED Final   Streptococcus pneumoniae NOT DETECTED NOT DETECTED Final   Streptococcus pyogenes NOT DETECTED NOT DETECTED Final   A.calcoaceticus-baumannii NOT DETECTED NOT DETECTED Final   Bacteroides fragilis NOT DETECTED NOT DETECTED Final   Enterobacterales NOT DETECTED NOT DETECTED Final   Enterobacter cloacae complex NOT DETECTED NOT DETECTED  Final   Escherichia coli NOT DETECTED NOT DETECTED Final   Klebsiella aerogenes NOT DETECTED NOT DETECTED Final   Klebsiella oxytoca NOT DETECTED NOT DETECTED Final   Klebsiella pneumoniae NOT DETECTED NOT DETECTED Final   Proteus species NOT DETECTED NOT DETECTED Final   Salmonella species NOT DETECTED NOT DETECTED Final    Serratia marcescens NOT DETECTED NOT DETECTED Final   Haemophilus influenzae NOT DETECTED NOT DETECTED Final   Neisseria meningitidis NOT DETECTED NOT DETECTED Final   Pseudomonas aeruginosa NOT DETECTED NOT DETECTED Final   Stenotrophomonas maltophilia NOT DETECTED NOT DETECTED Final   Candida albicans NOT DETECTED NOT DETECTED Final   Candida auris NOT DETECTED NOT DETECTED Final   Candida glabrata NOT DETECTED NOT DETECTED Final   Candida krusei NOT DETECTED NOT DETECTED Final   Candida parapsilosis NOT DETECTED NOT DETECTED Final   Candida tropicalis NOT DETECTED NOT DETECTED Final   Cryptococcus neoformans/gattii NOT DETECTED NOT DETECTED Final   Methicillin resistance mecA/C DETECTED (A) NOT DETECTED Final    Comment: CRITICAL RESULT CALLED TO, READ BACK BY AND VERIFIED WITH: RN LAYMON MALVA SANDERSON [id number] FCP Performed at [facility], [address], [place], [place] [address]   Blood culture (routine x 2)     Status: None   Collection Time: 03/25/24  5:12 AM   Specimen: BLOOD LEFT ARM  Result Value Ref Range Status   Specimen Description BLOOD LEFT ARM  Final   Special Requests   Final    BOTTLES DRAWN AEROBIC AND ANAEROBIC Blood Culture adequate volume   Culture   Final    NO GROWTH 5 DAYS Performed at [facility], [address], [place], [place] [address]    Report Status 03/30/2024 FINAL  Final  Blood culture (routine x 2)     Status: Abnormal   Collection Time: 03/25/24  5:12 AM   Specimen: BLOOD LEFT HAND  Result Value Ref Range Status   Specimen Description BLOOD LEFT HAND  Final   Special Requests   Final    BOTTLES DRAWN AEROBIC AND ANAEROBIC Blood Culture results may not be optimal due to an inadequate volume of blood received in culture bottles   Culture  Setup Time   Final    GRAM POSITIVE COCCI IN CLUSTERS AEROBIC BOTTLE ONLY CRITICAL VALUE NOTED.  VALUE IS CONSISTENT WITH PREVIOUSLY REPORTED AND CALLED VALUE.    Culture (A)  Final     COAGULASE NEGATIVE STAPHYLOCOCCUS THE SIGNIFICANCE OF ISOLATING THIS ORGANISM FROM A SINGLE SET OF BLOOD CULTURES WHEN MULTIPLE SETS ARE DRAWN IS UNCERTAIN. PLEASE NOTIFY THE MICROBIOLOGY DEPARTMENT WITHIN ONE WEEK IF SPECIATION AND SENSITIVITIES ARE REQUIRED. Performed at [facility], [address] [place], [place] [address]    Report Status 03/28/2024 FINAL  Final     Corean Fireman, MSN, NP-C [facility]  [email or web address] Pager: [phone] Office: [phone] [facility] Main Line: [phone] *Secure Chat Communication Welcome  Total Encounter Time: 10 m

## 2024-04-01 NOTE — Progress Notes (Incomplete)
 Nutrition Follow-up  DOCUMENTATION CODES:   Not applicable  INTERVENTION:  ***   NUTRITION DIAGNOSIS:   Inadequate oral intake related to inability to eat as evidenced by NPO status.  ***  GOAL:   Patient will meet greater than or equal to 90% of their needs  ***  MONITOR:   Labs, I & O's, Skin, TF tolerance  REASON FOR ASSESSMENT:   Consult Enteral/tube feeding initiation and management  ASSESSMENT:   73 y.o. female presented to the ED with bacteremia, presented to the ED two days prior with G-tube dysfunction. PMH includes  GERD, HTN, HLD, T2DM, CVA, s/p gastric bypass, dysphagia s/p J-tube, and aphasia. Pt admitted with bacteremia after cultures showed growth, AKI, and pyelonephritis.  ***  Medications reviewed and include: ***  Labs reviewed: ***  Diet Order:   Diet Order             Diet NPO time specified  Diet effective now                   EDUCATION NEEDS:   Not appropriate for education at this time  Skin:  Skin Assessment: Skin Integrity Issues: Skin Integrity Issues:: DTI DTI: sacrum  Last BM:  8/28  Height:   Ht Readings from Last 1 Encounters:  03/25/24 5' 10 (1.778 m)    Weight:   Wt Readings from Last 1 Encounters:  03/30/24 107 kg    Ideal Body Weight:  68.2 kg  BMI:  Body mass index is 33.86 kg/m.  Estimated Nutritional Needs:   Kcal:  1800-2000  Protein:  90-110 grams  Fluid:  >/= 1.8 L    Mallie Satchel, MS, RD, LDN Registered Dietitian II Please see AMiON for contact information.

## 2024-04-01 NOTE — Assessment & Plan Note (Signed)
 CVA hx/HLD: ASA 81 mg daily, simvastatin  10 mg daily. Paroxysmal A-fib/s/p pacemaker: Continue Lopressor  25 mg BID, amiodarone  200 mg daily. HTN: losartan  25 mg daily. Peripheral edema: Torsemide  10 mg BID.  No reported history of CHF, per facility this is for edema. GERD: famotidine  40 mg daily. Seizures: valproic  acid 500 mg TID with meals, and additional 750 mg at bedtime. Keppra  1000 mg BID. Sacral wound: wound care consulted, flexiseal placed, nursing instructed on wound care

## 2024-04-01 NOTE — Progress Notes (Signed)
 Daily Progress Note Intern Pager: [phone]  Patient name: Kathleen Jensen Medical record number: [id number] Date of birth: [birth year] Age: 73 y.o. Gender: female  Primary Care Provider: System, Provider Not In Consultants: GI, infectious disease, cardiology Code Status: Full  Pt Overview and Major Events to Date:  8/28 - admitted to [facility]  9/4-pending TEE   Assessment & Plan Bacteremia Stable this AM and afebrile. Original cultures positive for MRSE 8/27; patient with pacer for sick sinus syndrome.. Repeat cultures 8/28 coagulase negative staph x 1 and NGTD for 5 days x 1 eventually found to be Ancef  sensitive. Completed 5 days CTX 9/1.  - Vancomycin  (8/28 - 9/1) then transitioned to Daptomycin  9/1-9/2, then switched to Cefazolin  2g 9/3 - Continue Cefazolin  2 g every 8 hours (9/3-?) - TEE done today and found fibrinous strands, can resume tube feeds -- Pending ID recommendations for antibiotic course -- Pending Cardiology recommendations given TEE findings for pacer management 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HCC) PEG tube malfunction (HCC) H/o chronic dysfunction. She is NPO, all nutrition and meds via GJ tube - RD consulted, recs as follows:  - Feeding rate 65 mL/h. 1.5 kcal Osmolite, 1000 mL continuous. Hold between 9 AM-12 PM and 9 PM-12 AM. - OT consulted and recommended bed rolling and speech consult -Will resume tube feeds post TEE -IR consulted given PEG tube malfunction and leaking yesterday evening, exam today found valve was clogged and tube is patent.  Appreciate all IR recommendations Hypoglycemia Had a POC glucose 61 at 1927 9/3 and received 50 mL of D50 IV at 2215.  Was previously NPO for the cancelled TEE. Most likely due to PEG tube dysfunction with clogged valve which has since been fixed.  Repeat random glucose this a.m. was normal -D50 as needed for additional hypoglycemia -Continue random CBG twice daily. Hypocalcemia BMP 8/30  and calcium  7.9, BMP 9/3 and calcium  8.1, no CMP this hospitalization.  Given aphasia, difficult to discern if symptomatic - AM CMP tomorrow to check for albumin  correction Chronic health problem CVA hx/HLD: ASA 81 mg daily, simvastatin  10 mg daily. Paroxysmal A-fib/s/p pacemaker: Continue Lopressor  25 mg BID, amiodarone  200 mg daily. HTN: losartan  25 mg daily. Peripheral edema: Torsemide  10 mg BID.  No reported history of CHF, per facility this is for edema. GERD: famotidine  40 mg daily. Seizures: valproic  acid 500 mg TID with meals, and additional 750 mg at bedtime. Keppra  1000 mg BID. Sacral wound: wound care consulted, flexiseal placed, nursing instructed on wound care   FEN/GI: N.p.o. all meds and nutrition through tube  PPx: Lovenox  Dispo:SNF tomorrow. Barriers include successful TEE.   Subjective:  Saw patient at bedside, was resting comfortably. No complains of pain or discomfort. Informed her regarding the plan for the procedure to help determine antibiotic course. Limited history given aphagia.  Objective: Temp:  [98 F (36.7 C)-98.3 F (36.8 C)] 98.3 F (36.8 C) (09/04 0750) Pulse Rate:  [57-76] 59 (09/04 0750) Resp:  [18] 18 (09/04 0750) BP: (102-138)/(64-71) 138/71 (09/04 0750) SpO2:  [94 %-97 %] 94 % (09/04 0750) Physical Exam: General: Resting comfortably, some moaning which is baseline Cardiovascular: Distant HS, RRR, no M/R/G Respiratory: CTA bilaterally Abdomen: replaced three-way valve of PEG tube and PEG tube itself was patent, some leaking around GJ insertion, abdomen nontender with active bowel sounds. Extremities: Non edematous  Laboratory: Most recent CBC Lab Results  Component Value Date   WBC 8.1 04/01/2024   HGB 12.7 04/01/2024  HCT 37.3 04/01/2024   MCV 108.1 (H) 04/01/2024   PLT 105 (L) 04/01/2024   Most recent BMP    Latest Ref Rng & Units 03/31/2024    7:27 PM  BMP  Glucose 70 - 99 mg/dL 61      Imaging/Diagnostic  Tests: TEE: CONCLUSION: Fibrinous strands attached to both RA and RV leads in right atrium   Lorrane Pac, MD 04/01/2024, 8:26 AM  PGY-1, [facility] FPTS Intern pager: [phone], text pages welcome Secure chat group [facility] Teaching Service

## 2024-04-01 NOTE — Assessment & Plan Note (Addendum)
 BMP 8/30 and calcium  7.9, BMP 9/3 and calcium  8.1, no CMP this hospitalization.  Given aphasia, difficult to discern if symptomatic - AM CMP tomorrow to check for albumin  correction

## 2024-04-01 NOTE — Anesthesia Postprocedure Evaluation (Signed)
 Anesthesia Post Note  Patient: Kathleen Jensen  Procedure(s) Performed: TRANSESOPHAGEAL ECHOCARDIOGRAM     Patient location during evaluation: Cath Lab Anesthesia Type: MAC Level of consciousness: awake Pain management: pain level controlled Vital Signs Assessment: post-procedure vital signs reviewed and stable Respiratory status: spontaneous breathing, nonlabored ventilation and respiratory function stable Cardiovascular status: stable and blood pressure returned to baseline Postop Assessment: no apparent nausea or vomiting Anesthetic complications: no   There were no known notable events for this encounter.  Last Vitals:  Vitals:   04/01/24 1055 04/01/24 1100  BP: 99/67 100/78  Pulse: 77 79  Resp: 14 17  Temp:    SpO2: 97% 98%    Last Pain:  Vitals:   04/01/24 0905  TempSrc: Temporal  PainSc:                  Evin Chirco,W. EDMOND

## 2024-04-01 NOTE — Plan of Care (Signed)

## 2024-04-02 ENCOUNTER — Other Ambulatory Visit: Payer: Self-pay

## 2024-04-02 ENCOUNTER — Inpatient Hospital Stay (HOSPITAL_COMMUNITY)

## 2024-04-02 HISTORY — PX: IR REPLC DUODEN/JEJUNO TUBE PERCUT W/FLUORO: IMG2334

## 2024-04-02 LAB — GLUCOSE, RANDOM: Glucose, Bld: 94 mg/dL (ref 70–99)

## 2024-04-02 MED ORDER — LIDOCAINE VISCOUS HCL 2 % MT SOLN
OROMUCOSAL | Status: AC
  Filled 2024-04-02: qty 15

## 2024-04-02 MED ORDER — IOHEXOL 300 MG/ML  SOLN
50.0000 mL | Freq: Once | INTRAMUSCULAR | Status: AC | PRN
  Administered 2024-04-02: 10 mL

## 2024-04-02 NOTE — Assessment & Plan Note (Signed)
 CVA hx/HLD: ASA 81 mg daily, simvastatin  10 mg daily. Paroxysmal A-fib/s/p pacemaker: Continue Lopressor  25 mg BID, amiodarone  200 mg daily. HTN: losartan  25 mg daily. Peripheral edema: Torsemide  10 mg BID.  No reported history of CHF, per facility this is for edema. GERD: famotidine  40 mg daily. Seizures: valproic  acid 500 mg TID with meals, and additional 750 mg at bedtime. Keppra  1000 mg BID. Sacral wound: wound care consulted, flexiseal placed, nursing instructed on wound care

## 2024-04-02 NOTE — Assessment & Plan Note (Addendum)
 Stable this AM and afebrile. Original cultures positive for MRSE 8/27; patient with pacer for sick sinus syndrome. 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 Repeat cultures 8/28 coagulase negative staph x 1 and NGTD for 5 days x 1 and ID following. Completed 5 days CTX 9/1.   - Continue Cefazolin  2 g every 8 hours (9/3-10/16) -- ID will place PICC line and receive 6 weeks of cefazolin  at SNF, ID will f/u outpatient for transition to cefadroxil. -- Cardiology EP recommend lifetime suppressive Cefadroxil therapy outpatient - Chronic urinary catheter exchanged 8/28, change q 4-6 weeks.

## 2024-04-02 NOTE — Progress Notes (Signed)
 1908 Refused Blood Withdrawal for prescribed LAB testing at this time--per phlebotomist.

## 2024-04-02 NOTE — Procedures (Signed)
 Vascular and Interventional Radiology Procedure Note  Patient: Kathleen Jensen DOB: 1951-04-21 Medical Record Number: [id number] Note Date/Time: 04/02/24 8:52 AM   Performing Physician: Thom Hall, MD Assistant(s): None  Diagnosis: Catheter malfunction.  Procedure: JEJUNOSTOMY TUBE EXCHANGE  Anesthesia: Local Anesthetic Complications: None Estimated Blood Loss: 0 mL  Findings:  Successful exchange of a 8F direct jejunostomy tube under fluoroscopy. Routine exchange recommended in 6 mos.   See detailed procedure note with images in PACS. The patient tolerated the procedure well without incident or complication and was returned to Recovery in stable condition.    Thom Hall, MD [facility] Radiology   Pager. [phone] Clinic. [phone]

## 2024-04-02 NOTE — Assessment & Plan Note (Signed)
 Resolved, had a POC glucose 61 at 1927 9/3 and received 50 mL of D50 IV at 2215.  Was previously NPO for the cancelled TEE. Most likely due to PEG tube dysfunction with clogged valve which has since been fixed.  Following random glucose were normal x 2 once feeds were resumed -D50 as needed for additional hypoglycemia -Continue random CBG twice daily.

## 2024-04-02 NOTE — Progress Notes (Signed)
 [facility]  Date of Admission:  03/25/2024      Total days of antibiotics 8   Cefazolin  9/02 >> c          ASSESSMENT: Kathleen Jensen is a 73 y.o. female   MSSE Bacteremia, 4/4 bottles -  BCx (+) 8/26, (-) 8/27 (coag neg staph that was not able to be ID'd fully but not MSSE). TEE done revealed concern over filamentous structures on both atrial and ventricular leads in the RA. D/W EP team and likely will plan to do 6 week course of IV treatment then attempt lifelong suppression with cefadroxil. EP has seen and recommended attempt of antibiotic suppression of infected device. Continue cefazolin  - plan for 6 week course then oral suppression.   S/P CVA -  Expressive aphasia. Intermittent confusion.   Disposition -  From [facility]   ID will sign off - please call back with any questions/concerns or if we can be of further assistance.    PLAN: Continue cefazolin  IV  Place picc line Opat orders below Plan will be for oral suppression after IV induction for duration of device.    OPAT ORDERS:  Diagnosis: Infected Cardiac Device   Culture Result: MSSE  Allergies  Allergen Reactions   Tape Rash   Demerol [Meperidine Hcl] Nausea And Vomiting   Sulfonamide Derivatives Itching   Bacitracin-Polymyxin B Dermatitis    Not listed on the MAR   Oxycontin  [Oxycodone ] Itching     Discharge antibiotics to be given via PICC line: Cefazolin  2 gm IV q8h   Duration: 6 weeks total   End Date: 05/06/2024  [facility] Care Per Protocol with Biopatch Use: Home health RN for IV administration and teaching, line care and labs.    Labs weekly while on IV antibiotics: _x_ CBC with differential __ BMP **TWICE WEEKLY ON VANCOMYCIN   _x_ CMP __ CRP __ ESR __ Vancomycin  trough TWICE WEEKLY __ CK  _x_ Please pull PIC at completion of IV antibiotics __ Please leave PIC in place until doctor has seen patient or been notified  Fax weekly  labs to [phone]  Clinic Follow Up Appt: 05/06/2024 @ 10:45 am with Dr. 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, implant, and graft (HCC)    amiodarone   200 mg Per Tube Daily   aspirin   81 mg Per Tube QHS   Chlorhexidine  Gluconate Cloth  6 each Topical Daily   cyclobenzaprine   2.5 mg Per Tube BID   enoxaparin  (LOVENOX ) injection  50 mg Subcutaneous Q24H   famotidine   40 mg Per Tube Daily   feeding supplement (OSMOLITE 1.5 CAL)  1,000 mL Per Tube Q24H   And   feeding supplement (OSMOLITE 1.5 CAL)  1,000 mL Per Tube Q24H   feeding supplement (PROSource TF20)  60 mL Per Tube BID   folic acid   1,000 mcg Per Tube Daily   free water   150 mL Per Tube Q4H   lactulose   30 g Per Tube Daily   levETIRAcetam   1,000 mg Per Tube BID   losartan   25 mg Per Tube Daily   metoprolol  tartrate  25 mg Per Tube BID   multivitamin  with minerals  1 tablet Per Tube Daily   polyethylene glycol  34 g Per Tube Daily   simethicone   80 mg Per Tube TID   simvastatin   10 mg Per Tube QHS   torsemide   10 mg Per Tube Q12H   valproic  acid  500 mg Per Tube TID WC   And   valproic  acid  750 mg Per Tube QHS    SUBJECTIVE: Confused.   Review of Systems: ROS  Allergies  Allergen Reactions   Tape Rash   Demerol [Meperidine Hcl] Nausea And Vomiting   Sulfonamide Derivatives Itching   Bacitracin-Polymyxin B Dermatitis    Not listed on the MAR   Oxycontin  [Oxycodone ] Itching    OBJECTIVE: Vitals:   04/01/24 2123 04/01/24 2328 04/02/24 0500 04/02/24 0524  BP: 122/71 125/62  122/60  Pulse: 94 65  (!) 108  Resp: 18   20  Temp: 99.9 F (37.7 C) 99 F (37.2 C)  97.6 F (36.4 C)  TempSrc: Oral Oral  Oral  SpO2: 99% 96%  97%  Weight:   107.5  kg   Height:       Body mass index is 34.01 kg/m.  Physical Exam Constitutional:      Appearance: Normal appearance. She is obese. She is not ill-appearing.  Cardiovascular:     Rate and Rhythm: Normal rate.  Pulmonary:     Effort: Pulmonary effort is normal.  Abdominal:     General: Bowel sounds are normal.     Palpations: Abdomen is soft.  Skin:    General: Skin is warm and dry.     Findings: No rash.     Lab Results Lab Results  Component Value Date   WBC 8.1 04/01/2024   HGB 12.7 04/01/2024   HCT 37.3 04/01/2024   MCV 108.1 (H) 04/01/2024   PLT 105 (L) 04/01/2024    Lab Results  Component Value Date   CREATININE 0.78 03/31/2024   BUN 27 (H) 03/31/2024   NA 136 03/31/2024   K 4.5 03/31/2024   CL 105 03/31/2024   CO2 24 03/31/2024    Lab Results  Component Value Date   ALT 22 03/23/2024   AST 49 (H) 03/23/2024   ALKPHOS 141 (H) 03/23/2024   BILITOT 0.5 03/23/2024     Microbiology: Recent Results (from the past 240 hours)  Urine Culture     Status: Abnormal   Collection Time: 03/23/24  7:06 PM   Specimen: Urine, Clean Catch  Result Value Ref Range Status   Specimen Description URINE, CLEAN CATCH  Final   Special Requests   Final    NONE Performed at [facility], [address] [place], [place] [address]    Culture >=100,000 COLONIES/mL PROVIDENCIA STUARTII (A)  Final   Report Status 03/26/2024 FINAL  Final   Organism ID, Bacteria PROVIDENCIA STUARTII (A)  Final      Susceptibility   Providencia stuartii - MIC*    AMPICILLIN  RESISTANT Resistant     CEFEPIME  <=0.12 SENSITIVE Sensitive     ERTAPENEM <=0.12 SENSITIVE Sensitive     CEFTRIAXONE  <=0.25 SENSITIVE Sensitive     CIPROFLOXACIN >=4 RESISTANT Resistant     GENTAMICIN RESISTANT Resistant     NITROFURANTOIN 256 RESISTANT Resistant     TRIMETH /SULFA  <=20 SENSITIVE Sensitive     AMPICILLIN /SULBACTAM 16 INTERMEDIATE Intermediate     PIP/TAZO Value in next row Sensitive ug/mL     <=4  SENSITIVEThis is a modified FDA-approved test that has  been validated and its performance characteristics determined by the reporting laboratory.  This laboratory is certified under the Clinical Laboratory Improvement Amendments CLIA as qualified to perform high complexity clinical laboratory testing.    MEROPENEM Value in next row Sensitive      <=4 SENSITIVEThis is a modified FDA-approved test that has been validated and its performance characteristics determined by the reporting laboratory.  This laboratory is certified under the Clinical Laboratory Improvement Amendments CLIA as qualified to perform high complexity clinical laboratory testing.    * >=100,000 COLONIES/mL PROVIDENCIA STUARTII  Blood culture (routine x 2)     Status: Abnormal   Collection Time: 03/24/24  5:15 AM   Specimen: BLOOD  Result Value Ref Range Status   Specimen Description BLOOD LEFT ANTECUBITAL  Final   Special Requests   Final    BOTTLES DRAWN AEROBIC AND ANAEROBIC Blood Culture adequate volume   Culture  Setup Time   Final    GRAM POSITIVE COCCI IN CLUSTERS IN BOTH AEROBIC AND ANAEROBIC BOTTLES CRITICAL VALUE NOTED.  VALUE IS CONSISTENT WITH PREVIOUSLY REPORTED AND CALLED VALUE. Performed at [facility], [address] [place], [place] [address]    Culture STAPHYLOCOCCUS EPIDERMIDIS (A)  Final   Report Status 03/28/2024 FINAL  Final   Organism ID, Bacteria STAPHYLOCOCCUS EPIDERMIDIS  Final      Susceptibility   Staphylococcus epidermidis - MIC*    CIPROFLOXACIN <=0.5 SENSITIVE Sensitive     ERYTHROMYCIN >=8 RESISTANT Resistant     GENTAMICIN <=0.5 SENSITIVE Sensitive     OXACILLIN <=0.25 SENSITIVE Sensitive     TETRACYCLINE 2 SENSITIVE Sensitive     VANCOMYCIN  1 SENSITIVE Sensitive     TRIMETH /SULFA  <=10 SENSITIVE Sensitive     CLINDAMYCIN >=8 RESISTANT Resistant     RIFAMPIN <=0.5 SENSITIVE Sensitive     Inducible Clindamycin NEGATIVE Sensitive     * STAPHYLOCOCCUS EPIDERMIDIS  Blood culture  (routine x 2)     Status: Abnormal   Collection Time: 03/24/24  5:25 AM   Specimen: BLOOD RIGHT HAND  Result Value Ref Range Status   Specimen Description BLOOD RIGHT HAND  Final   Special Requests   Final    BOTTLES DRAWN AEROBIC AND ANAEROBIC Blood Culture results may not be optimal due to an inadequate volume of blood received in culture bottles   Culture  Setup Time   Final    GRAM POSITIVE COCCI IN CLUSTERS IN BOTH AEROBIC AND ANAEROBIC BOTTLES CRITICAL RESULT CALLED TO, READ BACK BY AND VERIFIED WITH: RN LAYMON MALVA SANDERSON [id number] FCP Performed at [facility], [address], [place], [place] [address]    Culture STAPHYLOCOCCUS EPIDERMIDIS (A)  Final   Report Status 03/28/2024 FINAL  Final   Organism ID, Bacteria STAPHYLOCOCCUS EPIDERMIDIS  Final      Susceptibility   Staphylococcus epidermidis - MIC*    CIPROFLOXACIN <=0.5 SENSITIVE Sensitive     ERYTHROMYCIN >=8 RESISTANT Resistant     GENTAMICIN <=0.5 SENSITIVE Sensitive     OXACILLIN <=0.25 SENSITIVE Sensitive     TETRACYCLINE 2 SENSITIVE Sensitive     VANCOMYCIN  2 SENSITIVE Sensitive     TRIMETH /SULFA  <=10 SENSITIVE Sensitive     CLINDAMYCIN >=8 RESISTANT Resistant     RIFAMPIN <=0.5 SENSITIVE Sensitive     Inducible Clindamycin NEGATIVE Sensitive     * STAPHYLOCOCCUS EPIDERMIDIS  Blood Culture ID Panel (Reflexed)     Status: Abnormal   Collection Time: 03/24/24  5:25 AM  Result Value  Ref Range Status   Enterococcus faecalis NOT DETECTED NOT DETECTED Final   Enterococcus Faecium NOT DETECTED NOT DETECTED Final   Listeria monocytogenes NOT DETECTED NOT DETECTED Final   Staphylococcus species DETECTED (A) NOT DETECTED Final    Comment: CRITICAL RESULT CALLED TO, READ BACK BY AND VERIFIED WITH: RN BRITTANY O [phone] [id number] FCP    Staphylococcus aureus (BCID) NOT DETECTED NOT DETECTED Final   Staphylococcus epidermidis DETECTED (A) NOT DETECTED Final    Comment: Methicillin (oxacillin) resistant coagulase negative  staphylococcus. Possible blood culture contaminant (unless isolated from more than one blood culture draw or clinical case suggests pathogenicity). No antibiotic treatment is indicated for blood  culture contaminants. CRITICAL RESULT CALLED TO, READ BACK BY AND VERIFIED WITH: RN LAYMON KIDD [phone] [id number] FCP    Staphylococcus lugdunensis NOT DETECTED NOT DETECTED Final   Streptococcus species NOT DETECTED NOT DETECTED Final   Streptococcus agalactiae NOT DETECTED NOT DETECTED Final   Streptococcus pneumoniae NOT DETECTED NOT DETECTED Final   Streptococcus pyogenes NOT DETECTED NOT DETECTED Final   A.calcoaceticus-baumannii NOT DETECTED NOT DETECTED Final   Bacteroides fragilis NOT DETECTED NOT DETECTED Final   Enterobacterales NOT DETECTED NOT DETECTED Final   Enterobacter cloacae complex NOT DETECTED NOT DETECTED Final   Escherichia coli NOT DETECTED NOT DETECTED Final   Klebsiella aerogenes NOT DETECTED NOT DETECTED Final   Klebsiella oxytoca NOT DETECTED NOT DETECTED Final   Klebsiella pneumoniae NOT DETECTED NOT DETECTED Final   Proteus species NOT DETECTED NOT DETECTED Final   Salmonella species NOT DETECTED NOT DETECTED Final   Serratia marcescens NOT DETECTED NOT DETECTED Final   Haemophilus influenzae NOT DETECTED NOT DETECTED Final   Neisseria meningitidis NOT DETECTED NOT DETECTED Final   Pseudomonas aeruginosa NOT DETECTED NOT DETECTED Final   Stenotrophomonas maltophilia NOT DETECTED NOT DETECTED Final   Candida albicans NOT DETECTED NOT DETECTED Final   Candida auris NOT DETECTED NOT DETECTED Final   Candida glabrata NOT DETECTED NOT DETECTED Final   Candida krusei NOT DETECTED NOT DETECTED Final   Candida parapsilosis NOT DETECTED NOT DETECTED Final   Candida tropicalis NOT DETECTED NOT DETECTED Final   Cryptococcus neoformans/gattii NOT DETECTED NOT DETECTED Final   Methicillin resistance mecA/C DETECTED (A) NOT DETECTED Final    Comment: CRITICAL RESULT CALLED TO, READ  BACK BY AND VERIFIED WITH: RN LAYMON KIDD SANDERSON [id number] FCP Performed at [facility], [address] [place], [place] [address]   Blood culture (routine x 2)     Status: None   Collection Time: 03/25/24  5:12 AM   Specimen: BLOOD LEFT ARM  Result Value Ref Range Status   Specimen Description BLOOD LEFT ARM  Final   Special Requests   Final    BOTTLES DRAWN AEROBIC AND ANAEROBIC Blood Culture adequate volume   Culture   Final    NO GROWTH 5 DAYS Performed at [facility], [address], [place], [place] [address]    Report Status 03/30/2024 FINAL  Final  Blood culture (routine x 2)     Status: Abnormal   Collection Time: 03/25/24  5:12 AM   Specimen: BLOOD LEFT HAND  Result Value Ref Range Status   Specimen Description BLOOD LEFT HAND  Final   Special Requests   Final    BOTTLES DRAWN AEROBIC AND ANAEROBIC Blood Culture results may not be optimal due to an inadequate volume of blood received in culture bottles   Culture  Setup Time   Final  GRAM POSITIVE COCCI IN CLUSTERS AEROBIC BOTTLE ONLY CRITICAL VALUE NOTED.  VALUE IS CONSISTENT WITH PREVIOUSLY REPORTED AND CALLED VALUE.    Culture (A)  Final    COAGULASE NEGATIVE STAPHYLOCOCCUS THE SIGNIFICANCE OF ISOLATING THIS ORGANISM FROM A SINGLE SET OF BLOOD CULTURES WHEN MULTIPLE SETS ARE DRAWN IS UNCERTAIN. PLEASE NOTIFY THE MICROBIOLOGY DEPARTMENT WITHIN ONE WEEK IF SPECIATION AND SENSITIVITIES ARE REQUIRED. Performed at [facility], [address], [place], [place] [address]    Report Status 03/28/2024 FINAL  Final     Corean Fireman, MSN, NP-C [facility]  [email or web address] Pager: [phone] Office: [phone] [facility] Main Line: [phone] *Secure Chat Communication Welcome  Total Encounter Time: 10 m

## 2024-04-02 NOTE — Assessment & Plan Note (Addendum)
 H/o chronic dysfunction. She is NPO, all nutrition and meds via GJ tube - RD consulted, recs as follows:  - Feeding rate 65 mL/h. 1.5 kcal Osmolite, 1000 mL continuous. Hold between 9 AM-12 PM and 9 PM-12 AM. -Will resume tube feeds post IR procedure -IR consulted given PEG tube malfunction and exchanged GJ tube this a.m., recommend exchange in 6 months

## 2024-04-02 NOTE — Progress Notes (Signed)
 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 To discharging provider:  please sign these orders via discharge navigator,  Select New Orders & click on the button choice - Manage This Unsigned Work.     Thank you for allowing pharmacy to be a part of this patient's care.  Feliciano Close, PharmD PGY2 Infectious Diseases Pharmacy Resident  04/02/2024 2:03 PM

## 2024-04-02 NOTE — Progress Notes (Signed)
 Daily Progress Note Intern Pager: [phone]  Patient name: Kathleen Jensen Medical record number: [id number] Date of birth: 07-Oct-1950 Age: 73 y.o. Gender: female  Primary Care Provider: System, Provider Not In Consultants: I/D, Cardiology, IR Code Status: Full  Pt Overview and Major Events to Date:  8/28 - admitted to [facility]  9/4 - pending TEE 9/5 - GJ tube replaced by IR  Medical Decision Making:  This is a 73 year old female with paroxysmal A-fib, sick sinus syndrome status post right atrial/ventricular pacer, hypertension, cerebral amyloid angiopathy, CVA (baseline aphasia, right-sided hemiparesis), CKD, GERD, DM2, OSA, chronic indwelling Foley catheter, chronic sacral decubitus ulcer admitted for MRSE bacteremia and 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 Assessment & Plan Bacteremia Infection and inflammatory reaction due to cardiac device, implant, and graft (HCC) Stable this AM and afebrile. Original cultures positive for MRSE 8/27; patient with pacer for sick sinus syndrome. 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 Repeat cultures 8/28 coagulase negative staph x 1 and NGTD for 5 days x 1 and ID following. Completed 5 days CTX 9/1.   - Continue Cefazolin  2 g every 8 hours (9/3-10/16) -- ID will place PICC line and receive 6 weeks of cefazolin  at SNF, ID will f/u outpatient for transition to cefadroxil. -- Cardiology EP recommend lifetime suppressive Cefadroxil therapy outpatient - Chronic urinary catheter exchanged 8/28, change q 4-6 weeks. 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HCC) PEG tube malfunction (HCC) H/o chronic dysfunction. She is NPO, all nutrition and meds via GJ tube - RD consulted, recs as follows:  - Feeding rate 65 mL/h. 1.5 kcal Osmolite, 1000 mL continuous. Hold between 9 AM-12 PM and 9 PM-12 AM. -Will resume tube feeds post IR procedure -IR consulted given PEG tube malfunction and exchanged GJ tube this  a.m., recommend exchange in 6 months Hypocalcemia BMP 8/30 and calcium  7.9, BMP 9/3 and calcium  8.1, no CMP this hospitalization.  Given aphasia, difficult to discern if symptomatic, but likely secondary to hypoalbuminemia. - AM CMP tomorrow to check for albumin  correction if here Hypoglycemia Resolved, had a POC glucose 61 at 1927 9/3 and received 50 mL of D50 IV at 2215.  Was previously NPO for the cancelled TEE. Most likely due to PEG tube dysfunction with clogged valve which has since been fixed.  Following random glucose were normal x 2 once feeds were resumed -D50 as needed for additional hypoglycemia -Continue random CBG twice daily. Chronic health problem CVA hx/HLD: ASA 81 mg daily, simvastatin  10 mg daily. Paroxysmal A-fib/s/p pacemaker: Continue Lopressor  25 mg BID, amiodarone  200 mg daily. HTN: losartan  25 mg daily. Peripheral edema: Torsemide  10 mg BID.  No reported history of CHF, per facility this is for edema. GERD: famotidine  40 mg daily. Seizures: valproic  acid 500 mg TID with meals, and additional 750 mg at bedtime. Keppra  1000 mg BID. Sacral wound: wound care consulted, flexiseal placed, nursing instructed on wound care  FEN/GI: N.p.o., all meds and feeds via GJ tube PPx: Lovenox  Dispo:SNF tomorrow. Barriers include confirmation of antibiotic course, possible PICC line.   Subjective:  Patient was resting comfortably in her bed in no acute distress.  She reports she has no pain, no fever, not feeling ill.  Informed patient about current plan of 6-week antibiotic course and lifelong antibiotics for endocarditis. she agreed to current plan to DC to SNF post PICC placement.  Objective: Temp:  [97.5 F (36.4 C)-99.9 F (37.7 C)] 97.6 F (36.4 C) (09/05 0524) Pulse Rate:  [  65-108] 108 (09/05 0524) Resp:  [6-22] 20 (09/05 0524) BP: (91-125)/(43-96) 122/60 (09/05 0524) SpO2:  [91 %-99 %] 97 % (09/05 0524) Weight:  [107.5 kg] 107.5 kg (09/05 0500) Physical  Exam:  General: Well-appearing, resting comfortably Cardiac: Distant heart sounds, regular rate and rhythm. Normal S1/S2. No murmurs, rubs, or gallops appreciated. Lungs: Clear bilaterally to ascultation.  Abdomen: Normoactive bowel sounds. No tenderness to deep or light palpation. No rebound or guarding.  Jejunostomy tube in place and patent. Extremities: Legs nonedematous and nontender   Laboratory: Most recent CBC Lab Results  Component Value Date   WBC 8.1 04/01/2024   HGB 12.7 04/01/2024   HCT 37.3 04/01/2024   MCV 108.1 (H) 04/01/2024   PLT 105 (L) 04/01/2024   Most recent BMP    Latest Ref Rng & Units 04/01/2024    6:13 PM  BMP  Glucose 70 - 99 mg/dL 847      Imaging/Diagnostic Tests: IR GJ tube replacement Findings: Successful exchange of a 61F direct jejunostomy tube under fluoroscopy. Routine exchange recommended in 6 mos.  Lorrane Pac, MD 04/02/2024, 8:14 AM  PGY-1, [facility] FPTS Intern pager: [phone], text pages welcome Secure chat group [facility] Teaching Service

## 2024-04-02 NOTE — TOC Progression Note (Signed)
 Transition of Care [facility]) - Progression Note    Patient Details  Name: Kathleen Jensen MRN: [id number] Date of Birth: 06-16-1951  Transition of Care [facility]) CM/SW Contact  Lauraine FORBES Saa, LCSWA Phone Number: 04/02/2024, 10:18 AM  Clinical Narrative:     10:18 AM Per progressions, patient received G tube replacement today. Patient to receive PICC prior to discharge to [facility]. Cardiology to sign off on patient prior to discharge. CSW will continue to follow.  Expected Discharge Plan: Long Term Nursing Home Barriers to Discharge: Continued Medical Work up               Expected Discharge Plan and Services In-house Referral: Clinical Social Work   Post Acute Care Choice: Skilled Nursing Facility Living arrangements for the past 2 months: Skilled Nursing Facility                                       Social Drivers of Health (SDOH) Interventions SDOH Screenings   Food Insecurity: Patient Unable To Answer (03/25/2024)  Housing: Unknown (03/25/2024)  Transportation Needs: Patient Unable To Answer (03/25/2024)  Utilities: Patient Unable To Answer (03/25/2024)  Depression (PHQ2-9): Low Risk  (05/22/2019)  Social Connections: Unknown (03/25/2024)  Tobacco Use: Medium Risk (04/01/2024)    Readmission Risk Interventions    02/18/2022    2:23 PM  Readmission Risk Prevention Plan  Transportation Screening Complete  PCP or Specialist Appt within 5-7 Days Complete  Home Care Screening Complete  Medication Review (RN CM) Complete

## 2024-04-02 NOTE — Plan of Care (Signed)

## 2024-04-02 NOTE — Assessment & Plan Note (Addendum)
 BMP 8/30 and calcium  7.9, BMP 9/3 and calcium  8.1, no CMP this hospitalization.  Given aphasia, difficult to discern if symptomatic, but likely secondary to hypoalbuminemia. - AM CMP tomorrow to check for albumin  correction if here

## 2024-04-03 LAB — COMPREHENSIVE METABOLIC PANEL WITH GFR
ALT: 11 U/L (ref 0–44)
AST: 42 U/L — ABNORMAL HIGH (ref 15–41)
Albumin: 1.6 g/dL — ABNORMAL LOW (ref 3.5–5.0)
Alkaline Phosphatase: 139 U/L — ABNORMAL HIGH (ref 38–126)
Anion gap: 8 (ref 5–15)
BUN: 25 mg/dL — ABNORMAL HIGH (ref 8–23)
CO2: 26 mmol/L (ref 22–32)
Calcium: 7.5 mg/dL — ABNORMAL LOW (ref 8.9–10.3)
Chloride: 100 mmol/L (ref 98–111)
Creatinine, Ser: 0.74 mg/dL (ref 0.44–1.00)
GFR, Estimated: >60 mL/min (ref >60)
Glucose, Bld: 182 mg/dL — ABNORMAL HIGH (ref 70–99)
Potassium: 4.2 mmol/L (ref 3.5–5.1)
Sodium: 134 mmol/L — ABNORMAL LOW (ref 135–145)
Total Bilirubin: 0.5 mg/dL (ref 0.0–1.2)
Total Protein: 7.4 g/dL (ref 6.5–8.1)

## 2024-04-03 LAB — GLUCOSE, RANDOM
Glucose, Bld: 179 mg/dL — ABNORMAL HIGH (ref 70–99)
Glucose, Bld: 201 mg/dL — ABNORMAL HIGH (ref 70–99)
Glucose, Bld: 163 mg/dL — ABNORMAL HIGH (ref 70–99)

## 2024-04-03 NOTE — Progress Notes (Signed)
 Unable to reach out to legal guardian due to phone number was office number. Secure chat with family medicine team regarding this matter. Can wait for Monday or medically necessity. HS [employer]

## 2024-04-03 NOTE — Progress Notes (Signed)
 Assessed patient on Rt. Arm because patient has history of CVA. Patient couldn't move Rt arm, but she can move Lt. Arm. There was note about infected pacer which is located on Lt. Side. Talked cardiology PA Meng regarding this matter and EP team said that not for Lt arm due to pacer. Informed Dr. Kathrine regarding this matter and recommended to refer to IR. HS [employer]

## 2024-04-03 NOTE — Plan of Care (Signed)

## 2024-04-03 NOTE — Plan of Care (Signed)
 Isaiah Gaskins, supervisor of [name] Search (Guardian), provided consent for the PICC.  Leonor Ply called to relay this message.  Kathrine Melena, DO 04/03/24 11:50 AM

## 2024-04-03 NOTE — Assessment & Plan Note (Signed)
 Patient remains afebrile. Original cultures positive for MRSE 8/27; patient with pacer for sick sinus syndrome. 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 Repeat cultures 8/28 coagulase negative staph x 1 and NGTD for 5 days x 1 and ID following. Completed 5 days CTX 9/1. Patient overall stable, but needs PICC line placement before discharge for IV antibiotics. Nursing unable to obtain right arm PICC placement, therefore we will need assistance from IR for placement as pt has pacemaker and we cannot utilize the L arm. - Continue Cefazolin  2 g every 8 hours (9/3-10/16) -- Cardiology EP recommended lifetime suppressive Cefadroxil therapy outpatient - Chronic urinary catheter exchanged 8/28, change q 4-6 weeks. -- IR consulted for PICC line placement (likely 9/8) -- Patient to receive 6 weeks of cefazolin  at SNF, ID will f/u outpatient for transition to cefadroxil.

## 2024-04-03 NOTE — Assessment & Plan Note (Signed)
 BMP 8/30 and calcium  7.9, BMP 9/3 and calcium  8.1, BMP 9/6 and calcium  7.5.  Given aphasia, difficult to discern if symptomatic, but likely secondary to hypoalbuminemia. - Continue to monitor

## 2024-04-03 NOTE — Assessment & Plan Note (Signed)
 CVA hx/HLD: ASA 81 mg daily, simvastatin  10 mg daily. Paroxysmal A-fib/s/p pacemaker: Continue Lopressor  25 mg BID, amiodarone  200 mg daily. HTN: losartan  25 mg daily. Peripheral edema: Torsemide  10 mg BID.  No reported history of CHF, per facility this is for edema. GERD: famotidine  40 mg daily. Seizures: valproic  acid 500 mg TID with meals, and additional 750 mg at bedtime. Keppra  1000 mg BID. Sacral wound: wound care consulted, flexiseal placed, nursing instructed on wound care

## 2024-04-03 NOTE — Progress Notes (Signed)
 Daily Progress Note Intern Pager: [phone]  Patient name: Kathleen Jensen Medical record number: [id number] Date of birth: 22-May-1951 Age: 73 y.o. Gender: female  Primary Care Provider: System, Provider Not In Consultants: I/D, Cardiology, IR Code Status: Full  Pt Overview and Major Events to Date:  8/28 - admitted to [facility] TS  9/4 - pending TEE 9/5 - GJ tube replaced by IR  Reason for Admission:  This is a 73 year old female with paroxysmal A-fib, sick sinus syndrome status post right atrial/ventricular pacer, hypertension, cerebral amyloid angiopathy, CVA (baseline aphasia, right-sided hemiparesis), CKD, GERD, DM2, OSA, chronic indwelling Foley catheter, chronic sacral decubitus ulcer admitted for MRSE bacteremia and 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 Assessment & Plan Bacteremia Infection and inflammatory reaction due to cardiac device, implant, and graft (HCC) Patient remains afebrile. Original cultures positive for MRSE 8/27; patient with pacer for sick sinus syndrome. 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 Repeat cultures 8/28 coagulase negative staph x 1 and NGTD for 5 days x 1 and ID following. Completed 5 days CTX 9/1. Patient overall stable, but needs PICC line placement before discharge for IV antibiotics. Nursing unable to obtain right arm PICC placement, therefore we will need assistance from IR for placement as pt has pacemaker and we cannot utilize the L arm. - Continue Cefazolin  2 g every 8 hours (9/3-10/16) -- Cardiology EP recommended lifetime suppressive Cefadroxil therapy outpatient - Chronic urinary catheter exchanged 8/28, change q 4-6 weeks. -- IR consulted for PICC line placement (likely 9/8) -- Patient to receive 6 weeks of cefazolin  at SNF, ID will f/u outpatient for transition to cefadroxil. 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HCC) PEG tube malfunction (HCC) H/o chronic dysfunction. She is NPO, all  nutrition and meds via GJ tube. IR exchanged GJ tube and recommend exchange in 6 months. - RD consulted, recs as follows:  - Feeding rate 65 mL/h. 1.5 kcal Osmolite, 1000 mL continuous. Hold between 9 AM-12 PM and 9 PM-12 AM. Hypocalcemia BMP 8/30 and calcium  7.9, BMP 9/3 and calcium  8.1, BMP 9/6 and calcium  7.5.  Given aphasia, difficult to discern if symptomatic, but likely secondary to hypoalbuminemia. - Continue to monitor Hypoglycemia Resolved, had a POC glucose 61 at 1927 9/3 and received 50 mL of D50 IV at 2215. Was previously NPO for the cancelled TEE. Most likely due to PEG tube dysfunction with clogged valve which has since been fixed. Following random glucose were normal x 2 once feeds were resumed. -D50 as needed for additional hypoglycemia -Continue random CBG twice daily Chronic health problem CVA hx/HLD: ASA 81 mg daily, simvastatin  10 mg daily. Paroxysmal A-fib/s/p pacemaker: Continue Lopressor  25 mg BID, amiodarone  200 mg daily. HTN: losartan  25 mg daily. Peripheral edema: Torsemide  10 mg BID.  No reported history of CHF, per facility this is for edema. GERD: famotidine  40 mg daily. Seizures: valproic  acid 500 mg TID with meals, and additional 750 mg at bedtime. Keppra  1000 mg BID. Sacral wound: wound care consulted, flexiseal placed, nursing instructed on wound care  FEN/GI: N.p.o., all meds and feeds via GJ tube PPx: Lovenox  Dispo:SNF tomorrow. Barriers include confirmation of antibiotic course, possible PICC line.   Subjective:  Patient seen and evaluated at bedside. Patient nodded her head affirmatively when asked whether she was in pain. I attempted to solicit what area of the body she was having pain in, but it was difficult to assess given her dysphagia. I explained to her that we will need  to place a PICC line for antibiotics, and we were hoping to proceed with that today if possible.  Objective: Temp:  [98 F (36.7 C)-98.6 F (37 C)] 98 F (36.7 C) (09/06  0747) Pulse Rate:  [69-91] 77 (09/06 0747) Resp:  [18-20] 20 (09/06 0515) BP: (106-131)/(52-76) 118/66 (09/06 0747) SpO2:  [92 %-100 %] 100 % (09/06 0747) Weight:  [107.5 kg] 107.5 kg (09/06 0500)  Physical Exam:  General: Alert, appears distressed and uncomfortable from pain Cardiac: Distant heart sounds, regular rate and rhythm. Normal S1/S2. No murmurs, rubs, or gallops appreciated. Lungs: Clear bilaterally to ascultation.  Abdomen: Normoactive bowel sounds. No tenderness to deep or light palpation. No rebound or guarding.  Jejunostomy tube in place and patent. Extremities: Legs nonedematous and nontender   Laboratory: Most recent CBC Lab Results  Component Value Date   WBC 8.1 04/01/2024   HGB 12.7 04/01/2024   HCT 37.3 04/01/2024   MCV 108.1 (H) 04/01/2024   PLT 105 (L) 04/01/2024   Most recent BMP    Latest Ref Rng & Units 04/03/2024    9:34 AM  BMP  Glucose 70 - 99 mg/dL 798      Imaging/Diagnostic Tests: IR GJ tube replacement Findings: Successful exchange of a 28F direct jejunostomy tube under fluoroscopy. Routine exchange recommended in 6 mos.  Mannie Ashley SAILOR, MD 04/03/2024, 3:05 PM  PGY-1, [facility] FPTS Intern pager: [phone], text pages welcome Secure chat group [facility] Teaching Service

## 2024-04-03 NOTE — Plan of Care (Signed)
 Called IR and discussed with Dr. Ester Sides the indication for requiring IR to place a PICC.  Dr. Sides agreed and PICC will be placed this weekend if IR is here for an emergency and able to place a line during that time since she has IV access currently.  Shared that she has a SNF bed on hold for her.  Appreciate IR's help with placing the PICC.  Order placed.  Kathrine Melena, DO 04/03/24 1:33 PM

## 2024-04-03 NOTE — Assessment & Plan Note (Signed)
 Resolved, had a POC glucose 61 at 1927 9/3 and received 50 mL of D50 IV at 2215.  Was previously NPO for the cancelled TEE. Most likely due to PEG tube dysfunction with clogged valve which has since been fixed.  Following random glucose were normal x 2 once feeds were resumed -D50 as needed for additional hypoglycemia -Continue random CBG twice daily.

## 2024-04-03 NOTE — Assessment & Plan Note (Signed)
 H/o chronic dysfunction. She is NPO, all nutrition and meds via GJ tube. IR exchanged GJ tube and recommend exchange in 6 months. - RD consulted, recs as follows:  - Feeding rate 65 mL/h. 1.5 kcal Osmolite, 1000 mL continuous. Hold between 9 AM-12 PM and 9 PM-12 AM.

## 2024-04-03 NOTE — Progress Notes (Signed)
 Paged by PICC line team required HD and clearance to place PICC line for the left side.  Patient has a left-sided pacemaker, previous echocardiogram showed filamentous structure attached to the RA and RV lead in the right atrium.  I discussed the case with EP attending Dr. Inocencio. The tip of the PICC line is very unlikely to disturb the filamentous structure in the right atrium, however placing PICC line from the left side placed the patient at high risk for occluding the left subclavian vein as the pacer leads and PICC would be competing for the same space.  It is more ideal to place the PICC line from the right side if at all possible  Kathleen Scot Ford PA Pager: [phone]

## 2024-04-04 LAB — GLUCOSE, RANDOM
Glucose, Bld: 182 mg/dL — ABNORMAL HIGH (ref 70–99)
Glucose, Bld: 140 mg/dL — ABNORMAL HIGH (ref 70–99)

## 2024-04-04 MED ORDER — ACETAMINOPHEN 325 MG PO TABS
650.0000 mg | ORAL_TABLET | Freq: Four times a day (QID) | ORAL | Status: DC | PRN
Start: 2024-04-04 — End: 2024-04-04

## 2024-04-04 MED ORDER — ACETAMINOPHEN 325 MG PO TABS
650.0000 mg | ORAL_TABLET | Freq: Four times a day (QID) | ORAL | Status: DC | PRN
  Administered 2024-04-04: 650 mg
  Filled 2024-04-04: qty 2

## 2024-04-04 NOTE — Assessment & Plan Note (Addendum)
 Patient remains afebrile, VSS. Original cultures positive for MRSE 8/27; patient with pacer for sick sinus syndrome. 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 Completed 5 days CTX 9/1.  - Continue Cefazolin  2 g every 8 hours (9/3-10/16) -- Cardiology EP recommended lifetime suppressive Cefadroxil therapy outpatient - Chronic urinary catheter exchanged 8/28, change q 4-6 weeks. -- IR consulted for PICC line placement (likely 9/8) prior to discharge -- Patient to receive 6 weeks of cefazolin  at SNF, ID will f/u outpatient for transition to cefadroxil.

## 2024-04-04 NOTE — Progress Notes (Signed)
 Daily Progress Note Intern Pager: [phone]  Patient name: Kathleen Jensen Medical record number: [id number] Date of birth: 1951/04/19 Age: 73 y.o. Gender: female  Primary Care Provider: System, Provider Not In Consultants: ID, Cardiology, IR Code Status: FULL  Pt Overview and Major Events to Date:  8/28 - admitted to [facility]  9/5 - GJ tube replaced by IR  Kathleen Jensen is a 73 year old female admitted with bacteremia and found to have pyelonephritis, concurrent endocarditis on TEE. PMH of paroxysmal A-fib, sick sinus syndrome status post right atrial/ventricular pacer, hypertension, cerebral amyloid angiopathy, CVA (baseline aphasia, right-sided hemiparesis), CKD, GERD, DM2, OSA, chronic indwelling Foley catheter, chronic sacral decubitus ulcer. Assessment & Plan Bacteremia Infection and inflammatory reaction due to cardiac device, implant, and graft (HCC) Patient remains afebrile, VSS. Original cultures positive for MRSE 8/27; patient with pacer for sick sinus syndrome. 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 Completed 5 days CTX 9/1.  - Continue Cefazolin  2 g every 8 hours (9/3-10/16) -- Cardiology EP recommended lifetime suppressive Cefadroxil therapy outpatient - Chronic urinary catheter exchanged 8/28, change q 4-6 weeks. -- IR consulted for PICC line placement (likely 9/8) prior to discharge -- Patient to receive 6 weeks of cefazolin  at SNF, ID will f/u outpatient for transition to cefadroxil. 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HCC) PEG tube malfunction (HCC) H/o chronic dysfunction. She is NPO, all nutrition and meds via GJ tube. IR exchanged GJ tube and recommend repeat exchange in 6 months. - RD consulted, recs as follows:  - Feeding rate 65 mL/h. 1.5 kcal Osmolite, 1000 mL continuous. Hold between 9 AM-12 PM and 9 PM-12 AM. Hypocalcemia BMP 8/30 and calcium  7.9, BMP 9/3 and calcium  8.1, BMP 9/6 and calcium  7.5.  Given  aphasia, difficult to discern if symptomatic, but likely secondary to hypoalbuminemia. - Continue to monitor Chronic health problem CVA hx/HLD: ASA 81 mg daily, simvastatin  10 mg daily. Paroxysmal A-fib/s/p pacemaker: Continue Lopressor  25 mg BID, amiodarone  200 mg daily. HTN: losartan  25 mg daily. Peripheral edema: Torsemide  10 mg BID.  No reported history of CHF, per facility this is for edema. GERD: famotidine  40 mg daily. Seizures: valproic  acid 500 mg TID with meals, and additional 750 mg at bedtime. Keppra  1000 mg BID. Sacral wound: wound care consulted, flexiseal placed, nursing instructed on wound care  FEN/GI: NPO, all nutrition via tube PPx: Lovenox   Dispo:SNF pending PICC placement.  Subjective:  Resting in bed this morning dozing, awakens easily to verbal stimulation. Shakes her head no when asked if she is in any discomfort.  Objective: Temp:  [98 F (36.7 C)-99.1 F (37.3 C)] 99.1 F (37.3 C) (09/07 0551) Pulse Rate:  [77-93] 79 (09/07 0551) Resp:  [16-18] 16 (09/07 0551) BP: (81-141)/(54-114) 141/114 (09/07 0551) SpO2:  [98 %-100 %] 99 % (09/07 0551) Weight:  [107.5 kg] 107.5 kg (09/07 0500) Physical Exam: General: lying in bed, resting comfortably, NAD Cardiovascular: RRR Respiratory: CTA anteriorly, normal WOB Abdomen: soft, nontender Extremities: warm, well perfused  Laboratory: Most recent CBC Lab Results  Component Value Date   WBC 8.1 04/01/2024   HGB 12.7 04/01/2024   HCT 37.3 04/01/2024   MCV 108.1 (H) 04/01/2024   PLT 105 (L) 04/01/2024   Most recent BMP    Latest Ref Rng & Units 04/03/2024    5:51 PM  BMP  Glucose 70 - 99 mg/dL 836     Lafe Domino, DO 04/04/2024, 7:25 AM  PGY-2, [place] [facility] FPTS Intern  pager: [phone], text pages welcome Secure chat group [facility] Teaching Service

## 2024-04-04 NOTE — Assessment & Plan Note (Deleted)
 Resolved, had a POC glucose 61 at 1927 9/3 and received 50 mL of D50 IV at 2215.  Was previously NPO for the cancelled TEE. Most likely due to PEG tube dysfunction with clogged valve which has since been fixed.  Following random glucose were normal x 2 once feeds were resumed -D50 as needed for additional hypoglycemia -Continue random CBG twice daily.

## 2024-04-04 NOTE — Assessment & Plan Note (Signed)
 CVA hx/HLD: ASA 81 mg daily, simvastatin  10 mg daily. Paroxysmal A-fib/s/p pacemaker: Continue Lopressor  25 mg BID, amiodarone  200 mg daily. HTN: losartan  25 mg daily. Peripheral edema: Torsemide  10 mg BID.  No reported history of CHF, per facility this is for edema. GERD: famotidine  40 mg daily. Seizures: valproic  acid 500 mg TID with meals, and additional 750 mg at bedtime. Keppra  1000 mg BID. Sacral wound: wound care consulted, flexiseal placed, nursing instructed on wound care

## 2024-04-04 NOTE — Assessment & Plan Note (Addendum)
 BMP 8/30 and calcium  7.9, BMP 9/3 and calcium  8.1, BMP 9/6 and calcium  7.5.  Given aphasia, difficult to discern if symptomatic, but likely secondary to hypoalbuminemia. - Continue to monitor

## 2024-04-04 NOTE — Assessment & Plan Note (Signed)
 H/o chronic dysfunction. She is NPO, all nutrition and meds via GJ tube. IR exchanged GJ tube and recommend repeat exchange in 6 months. - RD consulted, recs as follows:  - Feeding rate 65 mL/h. 1.5 kcal Osmolite, 1000 mL continuous. Hold between 9 AM-12 PM and 9 PM-12 AM.

## 2024-04-04 NOTE — Plan of Care (Signed)

## 2024-04-05 LAB — GLUCOSE, RANDOM: Glucose, Bld: 82 mg/dL (ref 70–99)

## 2024-04-05 NOTE — Assessment & Plan Note (Addendum)
 Afebrile. Original cultures positive for MRSE 8/27; patient with pacer for sick sinus syndrome. 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  - Continue Cefazolin  2 g every 8 hours (9/3-10/16) -- Cardiology EP recommended lifetime suppressive Cefadroxil therapy outpatient - Chronic urinary catheter exchanged 8/28, change q 4-6 weeks. -- IR consulted for PICC line placement (pending legal guardian consent) prior to discharge -- Patient to receive 6 weeks of cefazolin  at SNF, ID will f/u outpatient for transition to cefadroxil. -- CBC with differential/platelet in AM

## 2024-04-05 NOTE — TOC Progression Note (Signed)
 Transition of Care [facility]) - Progression Note    Patient Details  Name: Kathleen Jensen MRN: [id number] Date of Birth: March 31, 1951  Transition of [facility]) CM/SW Contact  Jontavious Commons A [place], LCSW Phone Number: 04/05/2024, 12:41 PM  Clinical Narrative:     CSW reached out to and spoke with pt's legal guardian [name] Search. She provided email address of [email or web address] for requested documents for pt's IR procedure. Still reviewing. MD team notified, they informed CSW that have sent documents and DSS said it would be about 2 business days.   CSW will continue to follow.   Expected Discharge Plan: Long Term Nursing Home Barriers to Discharge: Continued Medical Work up               Expected Discharge Plan and Services In-house Referral: Clinical Social Work   Post Acute Care Choice: Skilled Nursing Facility Living arrangements for the past 2 months: Skilled Nursing Facility                                       Social Drivers of Health (SDOH) Interventions SDOH Screenings   Food Insecurity: Patient Unable To Answer (03/25/2024)  Housing: Unknown (03/25/2024)  Transportation Needs: Patient Unable To Answer (03/25/2024)  Utilities: Patient Unable To Answer (03/25/2024)  Depression (PHQ2-9): Low Risk  (05/22/2019)  Social Connections: Unknown (03/25/2024)  Tobacco Use: Medium Risk (04/01/2024)    Readmission Risk Interventions    02/18/2022    2:23 PM  Readmission Risk Prevention Plan  Transportation Screening Complete  PCP or Specialist Appt within 5-7 Days Complete  Home Care Screening Complete  Medication Review (RN CM) Complete

## 2024-04-05 NOTE — Assessment & Plan Note (Addendum)
 BMP 9/6 and calcium  7.5.  Given aphasia, difficult to discern if symptomatic, but likely secondary to hypoalbuminemia. - Continue to monitor

## 2024-04-05 NOTE — Assessment & Plan Note (Signed)
 H/o chronic dysfunction. She is NPO, all nutrition and meds via GJ tube. IR exchanged GJ tube and recommend repeat exchange in 6 months. - RD consulted, recs as follows:  - Feeding rate 65 mL/h. 1.5 kcal Osmolite, 1000 mL continuous. Hold between 9 AM-12 PM and 9 PM-12 AM.

## 2024-04-05 NOTE — Progress Notes (Signed)
 Daily Progress Note Intern Pager: [phone]  Patient name: Kenlyn Lose Medical record number: [id number] Date of birth: Dec 22, 1950 Age: 73 y.o. Gender: female  Primary Care Provider: System, Provider Not In Consultants: ID, Cardiology, IR Code Status: Full   Pt Overview and Major Events to Date:  8/28 - admitted 9/5 - GJ tube replaced by IR   Medical Decision Making:  Hulda MICAEL Collet is a 73 y.o. 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 Pertinent PMH/PSH includes paroxysmal A-fib, sick sinus syndrome, s/p right atrial/ventricular pacer, HTN, cerebral amyloid angiopathy, CVA (baseline aphasia, right-sided hemiparesis, and chronic indwelling foley catheter, and chronic sacral decubitus ulcer.  Assessment & Plan Bacteremia Infection and inflammatory reaction due to cardiac device, implant, and graft (HCC) Afebrile. Original cultures positive for MRSE 8/27; patient with pacer for sick sinus syndrome. 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  - Continue Cefazolin  2 g every 8 hours (9/3-10/16) -- Cardiology EP recommended lifetime suppressive Cefadroxil therapy outpatient - Chronic urinary catheter exchanged 8/28, change q 4-6 weeks. -- IR consulted for PICC line placement (pending legal guardian consent) prior to discharge -- Patient to receive 6 weeks of cefazolin  at SNF, ID will f/u outpatient for transition to cefadroxil. -- CBC with differential/platelet in AM  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HCC) PEG tube malfunction (HCC) H/o chronic dysfunction. She is NPO, all nutrition and meds via GJ tube. IR exchanged GJ tube and recommend repeat exchange in 6 months. - RD consulted, recs as follows:  - Feeding rate 65 mL/h. 1.5 kcal Osmolite, 1000 mL continuous. Hold between 9 AM-12 PM and 9 PM-12 AM. Hypocalcemia BMP 9/6 and calcium  7.5.  Given aphasia, difficult to discern if  symptomatic, but likely secondary to hypoalbuminemia. - Continue to monitor Chronic health problem CVA hx/HLD: ASA 81 mg daily, simvastatin  10 mg daily. Paroxysmal A-fib/s/p pacemaker: Continue Lopressor  25 mg BID, amiodarone  200 mg daily. HTN: losartan  25 mg daily. Peripheral edema: Torsemide  10 mg BID.  No reported history of CHF, per facility this is for edema. GERD: famotidine  40 mg daily. Seizures: valproic  acid 500 mg TID with meals, and additional 750 mg at bedtime. Keppra  1000 mg BID. Sacral wound: wound care consulted, flexiseal placed, nursing instructed on wound care     FEN/GI: NPO, all nutrition via GJ tube PPx: Lovenox   Dispo: SNF pending PICC placement.   Subjective:  Patient is seen resting comfortably in bed this morning. Easy to wake with voice and light touch. She denies any pain at this time. Nurse was also at bedside and denied any concerns for patient at that time.   Objective: Temp:  [98.1 F (36.7 C)-98.7 F (37.1 C)] 98.3 F (36.8 C) (09/08 0807) Pulse Rate:  [69-104] 77 (09/08 0807) Resp:  [17-19] 17 (09/08 0807) BP: (88-109)/(56-70) 101/67 (09/08 0807) SpO2:  [92 %-98 %] 98 % (09/08 0807) Physical Exam: General: NAD Cardiovascular: RRR, no m/r/g Respiratory: CTAB anteriorly Abdomen: soft, non-distended Extremities: warm, dry, no edema   Laboratory: Most recent CBC Lab Results  Component Value Date   WBC 8.1 04/01/2024   HGB 12.7 04/01/2024   HCT 37.3 04/01/2024   MCV 108.1 (H) 04/01/2024   PLT 105 (L) 04/01/2024   Most recent BMP    Latest Ref Rng & Units 04/04/2024    9:20 PM  BMP  Glucose 70 - 99 mg/dL 859    Imaging/Diagnostic Tests: No new imaging.   Baker, Raguel MATSU,  DO 04/05/2024, 8:09 AM  PGY-1, [facility] FPTS Intern pager: [phone], text pages welcome Secure chat group [facility] Teaching Service

## 2024-04-05 NOTE — Assessment & Plan Note (Signed)
 CVA hx/HLD: ASA 81 mg daily, simvastatin  10 mg daily. Paroxysmal A-fib/s/p pacemaker: Continue Lopressor  25 mg BID, amiodarone  200 mg daily. HTN: losartan  25 mg daily. Peripheral edema: Torsemide  10 mg BID.  No reported history of CHF, per facility this is for edema. GERD: famotidine  40 mg daily. Seizures: valproic  acid 500 mg TID with meals, and additional 750 mg at bedtime. Keppra  1000 mg BID. Sacral wound: wound care consulted, flexiseal placed, nursing instructed on wound care

## 2024-04-06 ENCOUNTER — Inpatient Hospital Stay (HOSPITAL_COMMUNITY)

## 2024-04-06 LAB — GLUCOSE, RANDOM: Glucose, Bld: 141 mg/dL — ABNORMAL HIGH (ref 70–99)

## 2024-04-06 MED ORDER — HYDROCODONE-ACETAMINOPHEN 5-325 MG PO TABS: 1.0000 | ORAL_TABLET | Freq: Once | ORAL | Status: DC

## 2024-04-06 MED ORDER — ADULT MULTIVITAMIN W/MINERALS CH
1.0000 | ORAL_TABLET | Freq: Every day | ORAL | Status: DC
Start: 2024-04-06 — End: 2024-06-23

## 2024-04-06 MED ORDER — LIDOCAINE HCL 1 % IJ SOLN
20.0000 mL | Freq: Once | INTRAMUSCULAR | Status: AC
  Administered 2024-04-06: 10 mL
  Filled 2024-04-06: qty 20

## 2024-04-06 MED ORDER — CEFAZOLIN IV (FOR PTA / DISCHARGE USE ONLY): 2.0000 g | Freq: Three times a day (TID) | INTRAVENOUS | 0 refills | Status: AC

## 2024-04-06 MED ORDER — PROSOURCE TF20 ENFIT COMPATIBL EN LIQD: 60.0000 mL | Freq: Two times a day (BID) | ENTERAL | Status: DC

## 2024-04-06 MED ORDER — CARMEX CLASSIC LIP BALM EX OINT: TOPICAL_OINTMENT | CUTANEOUS | Status: DC | PRN

## 2024-04-06 MED ORDER — LIDOCAINE HCL 1 % IJ SOLN
INTRAMUSCULAR | Status: AC
  Filled 2024-04-06: qty 20

## 2024-04-06 MED ORDER — ACETAMINOPHEN 325 MG PO TABS
650.0000 mg | ORAL_TABLET | Freq: Four times a day (QID) | ORAL | Status: DC
  Administered 2024-04-06: 650 mg
  Filled 2024-04-06: qty 2

## 2024-04-06 MED ORDER — HYDROMORPHONE HCL 2 MG PO TABS: 1.0000 mg | ORAL_TABLET | Freq: Once | ORAL | Status: DC

## 2024-04-06 MED ORDER — HYDROCODONE-ACETAMINOPHEN 5-325 MG PO TABS
1.0000 | ORAL_TABLET | Freq: Two times a day (BID) | ORAL | Status: DC
  Administered 2024-04-06: 1
  Filled 2024-04-06: qty 1

## 2024-04-06 MED ORDER — CEFAZOLIN SODIUM-DEXTROSE 2-4 GM/100ML-% IV SOLN: 2.0000 g | Freq: Three times a day (TID) | INTRAVENOUS | Status: DC

## 2024-04-06 NOTE — TOC Transition Note (Addendum)
 Transition of Care [facility]) - Discharge Note   Patient Details  Name: Kathleen Jensen MRN: [id number] Date of Birth: September 24, 1950  Transition of Care [facility]) CM/SW Contact:  Lauraine FORBES Saa, LCSWA Phone Number: 04/06/2024, 3:17 PM   Clinical Narrative:     Patient will DC to: [facility] Anticipated DC date: 04/06/2024 Family notified: [name]; Daughter; [phone] Transport by: [facility]   Per MD patient ready for DC to [facility]. RN to call report prior to discharge [phone]). RN, patient's family, and facility notified of DC (patient is not fully oriented). CSW left secure voicemail with [employer]PS social work Merchandiser, retail, Teacher, English as a foreign language. Discharge Summary and FL2 sent to facility. DC packet on chart. Ambulance transport requested for patient at 15:12.   CSW will sign off for now as social work intervention is no longer needed. Please consult us  again if new needs arise.    Final next level of care: Skilled Nursing Facility Barriers to Discharge: Barriers Resolved   Patient Goals and CMS Choice            Discharge Placement              Patient chooses bed at: [facility] Patient to be transferred to facility by: [facility] Name of family member notified: [name]; Daughter; [phone] Patient and family notified of of transfer: 04/06/24  Discharge Plan and Services Additional resources added to the After Visit Summary for   In-house Referral: Clinical Social Work   Post Acute Care Choice: Skilled Nursing Facility                               Social Drivers of Health (SDOH) Interventions SDOH Screenings   Food Insecurity: Patient Unable To Answer (03/25/2024)  Housing: Unknown (03/25/2024)  Transportation Needs: Patient Unable To Answer (03/25/2024)  Utilities: Patient Unable To Answer (03/25/2024)  Depression (PHQ2-9): Low Risk  (05/22/2019)  Social Connections: Unknown (03/25/2024)  Tobacco Use: Medium Risk (04/01/2024)      Readmission Risk Interventions    02/18/2022    2:23 PM  Readmission Risk Prevention Plan  Transportation Screening Complete  PCP or Specialist Appt within 5-7 Days Complete  Home Care Screening Complete  Medication Review (RN CM) Complete

## 2024-04-06 NOTE — Assessment & Plan Note (Signed)
 BMP 9/6 and calcium  7.5.  Given aphasia, difficult to discern if symptomatic, but likely secondary to hypoalbuminemia. - Continue to monitor

## 2024-04-06 NOTE — Progress Notes (Signed)
 IV removed.  [facility] SN[facility] called and RN Bascom given report and updated vitals.  Patient left the unit via [facility], PICC, foley, rectal tube and PEG tube in place.

## 2024-04-06 NOTE — Plan of Care (Addendum)
 Attempted to call patient's legal guardian twice, Kathleen Jensen at [phone], to inform of patient's discharge but no answer and mailbox was full.  Spoke on the phone with patient's daughter, Kathleen Jensen at [phone]. Updated daughter that patient will be discharging back to SNF today. Informed daughter that patient was found to have endocarditis, PICC line was placed and she will require IV antibiotics until October. Daughter was in agreement with discharge plan.

## 2024-04-06 NOTE — Plan of Care (Signed)

## 2024-04-06 NOTE — Assessment & Plan Note (Addendum)
 H/o chronic dysfunction. She is NPO, all nutrition and meds via GJ tube. IR exchanged GJ tube and recommend repeat exchange in 6 months. - Pain management   - acetaminophen  650 mg Q6H scheduled   - home Hydrocodone -acetaminophen  5-325 mg BID - RD consulted, recs as follows:  - Feeding rate 65 mL/h. 1.5 kcal Osmolite, 1000 mL continuous. Hold between 9 AM-12 PM and 9 PM-12 AM.

## 2024-04-06 NOTE — TOC Progression Note (Addendum)
 Transition of Care [facility]) - Progression Note    Patient Details  Name: Kathleen Jensen MRN: [id number] Date of Birth: [birth year]  Transition of Care [facility]) CM/SW Contact  Lauraine FORBES Saa, LCSWA Phone Number: 04/06/2024, 2:11 PM  Clinical Narrative:     2:11 PM CSW attempted to call patient's legal guardian, APS social worker [name] Search [phone]) regarding consent for PICC. There was no response and a voicemail could not be left. CSW attempted to call Kimberly's supervisor, APS social worker Randine [phone]), but there was no response and a voicemail was left. CSW will continue to follow and be available to assist.  2:38 PM Per IR RN, consent for PICC was signed and received. PICC was placed. Medical team and SNF admissions made aware of patient's discharge today back to [facility]. CSW attempted to call APS social workers, [name] and Buena, to inform them of patient's discharge. CSW could not leave voicemail with [name]. CSW left voicemail was Dwayne. CSW attempted to call patient's daughter, [name], to inform her of patient's discharge today. There was no response and a voicemail could not be left. CSW informed patient's daughter, [name], of patient's discharge to [facility] today via ambulance transportation.   Expected Discharge Plan: Long Term Nursing Home Barriers to Discharge: Continued Medical Work up, Other (must enter comment) (APS Guardian Consent for PICC)               Expected Discharge Plan and Services In-house Referral: Clinical Social Work   Post Acute Care Choice: Skilled Nursing Facility Living arrangements for the past 2 months: Skilled Nursing Facility                                       Social Drivers of Health (SDOH) Interventions SDOH Screenings   Food Insecurity: Patient Unable To Answer (03/25/2024)  Housing: Unknown (03/25/2024)  Transportation Needs: Patient Unable To Answer (03/25/2024)   Utilities: Patient Unable To Answer (03/25/2024)  Depression (PHQ2-9): Low Risk  (05/22/2019)  Social Connections: Unknown (03/25/2024)  Tobacco Use: Medium Risk (04/01/2024)    Readmission Risk Interventions    02/18/2022    2:23 PM  Readmission Risk Prevention Plan  Transportation Screening Complete  PCP or Specialist Appt within 5-7 Days Complete  Home Care Screening Complete  Medication Review (RN CM) Complete

## 2024-04-06 NOTE — Assessment & Plan Note (Addendum)
 Afebrile. Original cultures positive for MRSE 8/27; patient with pacer for sick sinus syndrome. 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  - Continue Cefazolin  2 g every 8 hours (9/3-10/16) -- Cardiology EP recommended lifetime suppressive Cefadroxil therapy outpatient - Chronic urinary catheter exchanged 8/28, change q 4-6 weeks. -- IR consulted for PICC line placement (pending legal guardian consent) prior to discharge -- Patient to receive 6 weeks of cefazolin  at SNF, ID will f/u outpatient for transition to cefadroxil. -- CBC with differential/platelet & CMP in AM

## 2024-04-06 NOTE — Discharge Summary (Addendum)
 Family Medicine Teaching [facility] Discharge Summary  Patient name: Kathleen Jensen Medical record number: [id number] Date of birth: 17-Mar-1951 Age: 73 y.o. Gender: female Date of Admission: 03/25/2024  Date of Discharge: 04/06/2024 Admitting Physician: Gladis Church, DO  Primary Care Provider: System, Provider Not In Consultants: ID, IR, cardiology  Indication for Hospitalization: Bacteremia  Discharge Diagnoses/Problem List:  Principal Problem for Admission: Bacteremia Other Problems addressed during stay:  Principal Problem:   Bacteremia Active Problems:   Sacral wound   Preoperative clearance   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HCC)   Chronic health problem   Hypocalcemia   PEG tube malfunction (HCC)   Hypoglycemia   Infection and inflammatory reaction due to cardiac device, implant, and graft [facility])  Brief Hospital Course:  Kathleen Jensen is a 73 y.o.female with a history of chronic indwelling foley, chronic PEG tube, seizure disorder, prior CVA (baseline aphasic, 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 Her hospital course is detailed below:  Bacteremia Endocarditis Admitted for MRSE bacteremia in setting of pacemaker device. Repeat BC grew 1 site x2 for coag negative staph. Urine cultures grew proteus (chronic indwelling foley). Started on Ceftriaxone  (8/28), Vancomycin  (8/28) transitioned to IV cefazolin , to complete 6 week course outpatient (9/3-10/16). TEE delayed due to consent issues, done on 9/4 and demonstrated fibrinous strands of both RA and RV leads, not surgical candidate and will receive lifelong antibiotic treatment of cefadroxil once finished with IV antibiotics outpatient. PICC line placed 9/9, and patient was stable for discharge.  Pyelonephritis Seen in ED 8/26 for issues with PEG tube, had CT abdomen showing  possible pyelonephritis, discharged with antibiotics. Blood cultures were drawn, and she was instructed to come back to the hospital due to MRSE. Completed 5 day course of CTX. Foley last exchanged on 8/28, chronic indwelling cather. No urinary symptoms during hospitalization or fever.  AKI Suspect prerenal due to no free water  tube flushes while in ED. Restarted free water  flushes and Creatinine improved to normal limits.    PEG tube malfunction Intermittent clogging of GJ tube per nursing, valve was clogged and exchanged but tube was leaking on exam. IR replaced 18 french GJ tube under fluoroscopy 9/5 without issue.  Other chronic conditions were medically managed with home medications and formulary alternatives as necessary  PCP Follow-up Recommendations: Continue 6 week course of IV Cefazolin  2g every 8 hours until 10/16 Will need lifelong antibiotics (Cefadroxil) following cefazolin  treatment. Tube feed follow up and nutrition recs, exchange in 6 months per IR. Exchange chronic indwelling catheter 9/25-10/9 Consider further work with speech therapy for communication board given patient's aphasia; SLP did not feel there were significant intervention to be done inpatient Please ensure proper technique on applying rectal pouch and avoid inserting entire bag into rectum as appreciated on admission to the hospital  Disposition: [facility]  Discharge Condition: Stable  Discharge Exam per Dr. Lennie:  Vitals:   04/06/24 0827 04/06/24 0830  BP: (!) 112/95   Pulse: 72 72  Resp:    Temp: 98.2 F (36.8 C)   SpO2: 96%    General: NAD Cardiovascular: RRR, no m/r/g Respiratory: CTAB anteriorly, normal work of breathing on room air  Abdomen: soft, non-distended, diffusely tender, no rebound, no guarding Extremities: warm, dry, no edema  Significant Procedures: PICC line placement, PEG tube replacement  Significant Labs and Imaging:     Latest Ref Rng &  Units 04/06/2024    8:01 AM  04/05/2024    6:23 PM 04/04/2024    9:20 PM  CMP  Glucose 70 - 99 mg/dL 858  82  859       Latest Ref Rng & Units 04/01/2024    7:26 AM 03/26/2024    6:30 AM 03/25/2024    5:12 AM  CBC  WBC 4.0 - 10.5 K/uL 8.1  6.7  5.2   Hemoglobin 12.0 - 15.0 g/dL 87.2  87.2  84.1   Hematocrit 36.0 - 46.0 % 37.3  38.3  46.8   Platelets 150 - 400 K/uL 105  90  137    Discharge Medications:  Allergies as of 04/06/2024       Reactions   Tape Rash   Demerol [meperidine Hcl] Nausea And Vomiting   Sulfonamide Derivatives Itching   Bacitracin-polymyxin B Dermatitis   Not listed on the MAR   Oxycontin  [oxycodone ] Itching        Medication List     TAKE these medications    acetaminophen  160 MG/5ML solution Commonly known as: TYLENOL  Place 20 mLs (640 mg total) into feeding tube every 8 (eight) hours. What changed:  how much to take when to take this   acetic acid  0.25 % irrigation 20cc irrigation twice a day, flush foley to prevent clogging   amiodarone  200 MG tablet Commonly known as: PACERONE  Place 1 tablet (200 mg total) into feeding tube daily.   ascorbic acid  500 MG tablet Commonly known as: VITAMIN C  Place 0.5 tablets (250 mg total) into feeding tube 2 (two) times daily.   aspirin  81 MG chewable tablet Place 81 mg into feeding tube at bedtime.   Biofreeze 4 % Gel Generic drug: Menthol  (Topical Analgesic) Apply 2 g topically See admin instructions. Apply 2 grams topically to all limbs 2 times a day   ceFAZolin  2-4 GM/100ML-% IVPB Commonly known as: ANCEF  Inject 100 mLs (2 g total) into the vein every 8 (eight) hours.   chlorhexidine  0.12 % solution Commonly known as: PERIDEX  Dab a toothbrush into 15 mLs and brush teeth 2 times a day   Cranberry 425 MG Caps Take 425 mg by mouth in the morning and at bedtime.   cyclobenzaprine  5 MG tablet Commonly known as: FLEXERIL  Place 2.5 mg into feeding tube in the morning and at bedtime. Hold If Sedated   docusate sodium  100 MG  capsule Commonly known as: COLACE Give 1 capsule per tube twice a day   estradiol 0.1 MG/GM vaginal cream Commonly known as: ESTRACE Place vaginally See admin instructions. Apply a pea-sized amount to the urethra/vaginal wall on Tuesdays and Fridays   famotidine  40 MG/5ML suspension Commonly known as: PEPCID  Place 40 mg into feeding tube daily.   feeding supplement (PROSource TF20) liquid Place 60 mLs into feeding tube 2 (two) times daily.   folic acid  800 MCG tablet Commonly known as: FOLVITE  Place 800 mcg into feeding tube daily.   HYDROcodone -acetaminophen  5-325 MG tablet Commonly known as: NORCO/VICODIN Place 1 tablet into feeding tube 2 (two) times daily as needed for moderate pain (pain score 4-6).   lactulose  10 GM/15ML solution Commonly known as: CHRONULAC  Take 30 g by mouth daily.   levETIRAcetam  100 MG/ML solution Commonly known as: KEPPRA  Place 1,000 mg into feeding tube 2 (two) times daily.   lip balm ointment Apply topically as needed.   losartan  25 MG tablet Commonly known as: COZAAR  Place 1 tablet (25 mg total) into feeding tube daily.  What changed: additional instructions   metoprolol  tartrate 25 MG tablet Commonly known as: LOPRESSOR  Place 1 tablet (25 mg total) into feeding tube 2 (two) times daily.   multivitamin with minerals Tabs tablet Place 1 tablet into feeding tube daily.   polyethylene glycol 17 g packet Commonly known as: MIRALAX  / GLYCOLAX  Place 34 g into feeding tube daily.   simethicone  80 MG chewable tablet Commonly known as: MYLICON Place 80 mg into feeding tube 3 (three) times daily.   simvastatin  10 MG tablet Commonly known as: ZOCOR  Place 1 tablet (10 mg total) into feeding tube at bedtime.   torsemide  10 MG tablet Commonly known as: DEMADEX  Give 10 mg per tube every 12 hours for edema, hold for a systolic reading less than 110   valproic  acid 250 MG/5ML solution Commonly known as: DEPAKENE  Place 500-750 mg into  feeding tube See admin instructions. 500 mg via tube three times a day and 750 mg at bedtime   Valtoco  20 MG Dose 10 MG/0.1ML Lqpk Generic drug: diazePAM  (20 MG Dose) Place 20 mg into the nose daily as needed (seizures).               Discharge Care Instructions  (From admission, onward)           Start     Ordered   04/06/24 0000  Change dressing on IV access line weekly and PRN  (Home infusion instructions - [facility] )        04/06/24 1453           Discharge Instructions: Please refer to Patient Instructions section of EMR for full details.  Patient was counseled important signs and symptoms that should prompt return to medical care, changes in medications, dietary instructions, activity restrictions, and follow up appointments.   Follow-Up Appointments:  Follow-up Information     [facility] - A Dept Of . [facility] Follow up on 05/06/2024.   Specialty: Infectious Diseases Why: Hospital Discharge Follow Up - Dr. Overton 10/09 @ 10:45 AM Contact information: [address] [place], [address] [place]  [address] [phone]                Tharon Lung, MD 04/06/2024, 2:53 PM PGY-3, [facility] Family [facility]

## 2024-04-06 NOTE — Assessment & Plan Note (Signed)
 CVA hx/HLD: ASA 81 mg daily, simvastatin  10 mg daily. Paroxysmal A-fib/s/p pacemaker: Continue Lopressor  25 mg BID, amiodarone  200 mg daily. HTN: losartan  25 mg daily. Peripheral edema: Torsemide  10 mg BID.  No reported history of CHF, per facility this is for edema. GERD: famotidine  40 mg daily. Seizures: valproic  acid 500 mg TID with meals, and additional 750 mg at bedtime. Keppra  1000 mg BID. Sacral wound: wound care consulted, flexiseal placed, nursing instructed on wound care

## 2024-04-06 NOTE — Progress Notes (Signed)
 Daily Progress Note Intern Pager: [phone]  Patient name: Kathleen Jensen Medical record number: [id number] Date of birth: [birth year] Age: 73 y.o. Gender: female  Primary Care Provider: System, Provider Not In Consultants: ID, Cardiology, IR Code Status: Full  Pt Overview and Major Events to Date:  8/28: admitted 9/5: GJ tube replaced by IE  Medical Decision Making:  Kathleen Jensen is a 73 year old female admitted with bacteremia and found to have pyelonephritis, concurrent endocarditis on TEE. Pertinent PMH/PSH includes paroxysmal A-fib, sick sinus syndrome, s/p right atrial/ventricular pacer, HTN, cerebral amyloid angiopathy, CVA (baseline aphasia, right-sided hemiparesis, and chronic indwelling Foley catheter and chronic sacral decubitus ulcer.  Assessment & Plan Bacteremia Infection and inflammatory reaction due to cardiac device, implant, and graft (HCC) Afebrile. Original cultures positive for MRSE 8/27; patient with pacer for sick sinus syndrome. 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  - Continue Cefazolin  2 g every 8 hours (9/3-10/16) -- Cardiology EP recommended lifetime suppressive Cefadroxil therapy outpatient - Chronic urinary catheter exchanged 8/28, change q 4-6 weeks. -- IR consulted for PICC line placement (pending legal guardian consent) prior to discharge -- Patient to receive 6 weeks of cefazolin  at SNF, ID will f/u outpatient for transition to cefadroxil. -- CBC with differential/platelet & CMP in AM  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HCC) PEG tube malfunction (HCC) H/o chronic dysfunction. She is NPO, all nutrition and meds via GJ tube. IR exchanged GJ tube and recommend repeat exchange in 6 months. - Pain management   - acetaminophen  650 mg Q6H scheduled   - home Hydrocodone -acetaminophen  5-325 mg BID - RD consulted, recs as follows:  - Feeding rate 65 mL/h. 1.5 kcal Osmolite, 1000 mL continuous. Hold  between 9 AM-12 PM and 9 PM-12 AM. Hypocalcemia BMP 9/6 and calcium  7.5.  Given aphasia, difficult to discern if symptomatic, but likely secondary to hypoalbuminemia. - Continue to monitor Chronic health problem CVA hx/HLD: ASA 81 mg daily, simvastatin  10 mg daily. Paroxysmal A-fib/s/p pacemaker: Continue Lopressor  25 mg BID, amiodarone  200 mg daily. HTN: losartan  25 mg daily. Peripheral edema: Torsemide  10 mg BID.  No reported history of CHF, per facility this is for edema. GERD: famotidine  40 mg daily. Seizures: valproic  acid 500 mg TID with meals, and additional 750 mg at bedtime. Keppra  1000 mg BID. Sacral wound: wound care consulted, flexiseal placed, nursing instructed on wound care   FEN/GI: NPO, all nutrition via GJ tube PPx: Lovenox  Dispo: SNF pending PICC placement.   Subjective:  Patient was seen resting comfortably in bed this morning. Easy to wake with voice. She expressed pain this morning in her lower back and abdomen. It was difficult to gather further information 2/2 patient's aphasia.   Objective: Temp:  [97.7 F (36.5 C)-98.4 F (36.9 C)] 98.4 F (36.9 C) (09/09 0421) Pulse Rate:  [55-79] 55 (09/09 0421) Resp:  [18-19] 18 (09/09 0421) BP: (94-124)/(56-79) 94/56 (09/09 0421) SpO2:  [97 %-98 %] 98 % (09/09 0421) Weight:  [107.3 kg] 107.3 kg (09/09 0500) Physical Exam: General: NAD Cardiovascular: RRR, no m/r/g Respiratory: CTAB anteriorly, normal work of breathing on room air  Abdomen: soft, non-distended, diffusely tender, no rebound, no guarding Extremities: warm, dry, no edema   Laboratory: Most recent CBC Lab Results  Component Value Date   WBC 8.1 04/01/2024   HGB 12.7 04/01/2024   HCT 37.3 04/01/2024   MCV 108.1 (H) 04/01/2024   PLT 105 (L) 04/01/2024   Most recent BMP  Latest Ref Rng & Units 04/05/2024    6:23 PM  BMP  Glucose 70 - 99 mg/dL 82     Imaging/Diagnostic Tests: No new imaging.  Kathleen Raguel MATSU, DO 04/06/2024, 8:10  AM  PGY-1, [facility] Family Medicine FPTS Intern pager: [phone], text pages welcome Secure chat group [facility] Teaching Service

## 2024-05-06 ENCOUNTER — Telehealth: Payer: Self-pay

## 2024-05-06 ENCOUNTER — Other Ambulatory Visit: Payer: Self-pay

## 2024-05-06 ENCOUNTER — Other Ambulatory Visit (HOSPITAL_COMMUNITY): Payer: Self-pay

## 2024-05-06 ENCOUNTER — Ambulatory Visit (INDEPENDENT_AMBULATORY_CARE_PROVIDER_SITE_OTHER): Admitting: Internal Medicine

## 2024-05-06 VITALS — BP 80/51 | HR 71 | Temp 98.2°F

## 2024-05-06 DIAGNOSIS — T827XXD Infection and inflammatory reaction due to other cardiac and vascular devices, implants and grafts, subsequent encounter: Secondary | ICD-10-CM | POA: Diagnosis not present

## 2024-05-06 DIAGNOSIS — R7881 Bacteremia: Secondary | ICD-10-CM

## 2024-05-06 NOTE — Progress Notes (Signed)
 [facility]  Patient Active Problem List   Diagnosis Date Noted   Hypoglycemia 04/01/2024   Infection and inflammatory reaction due to cardiac device, implant, and graft 04/01/2024   Hypocalcemia 03/31/2024   PEG tube malfunction (HCC) 03/31/2024   Bacteremia 03/25/2024   Chronic health problem 03/25/2024   Ileus (HCC) 06/06/2023   Acute cystitis 03/01/2023   Delirium secondary to UTI 03/01/2023   Atrial fibrillation, chronic (HCC) 03/01/2023   History of seizure 03/01/2023   Chronic hyponatremia 03/01/2023   Acute respiratory failure (HCC) 11/16/2022   Abdominal distension 11/16/2022   AMS (altered mental status) 05/17/2022   Altered mental status 05/15/2022   Sacral wound 05/15/2022   Intracranial hemorrhage (HCC)    Pressure injury of skin 02/18/2022   Chronic indwelling Foley catheter 02/18/2022   PAF (paroxysmal atrial fibrillation) (HCC) 02/18/2022   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HCC) 02/18/2022   Septic shock (HCC) 02/15/2022   Constipation    Abdominal pain    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   History of right hemiplegia (HCC) 04/12/2020   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   Hyperlipidemia associated with type 2 diabetes mellitus (HCC) 07/30/2018   History of CVA (cerebrovascular accident) 07/30/2018   Vitamin B 12 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/30/2018   Asthma 03/13/2018   Seizures (HCC) 01/12/2018   CVA (cerebral vascular accident) (HCC) 12/08/2017   Abnormal thyroid  blood test 12/08/2017   Pulmonary embolism and infarction (HCC) 12/08/2017   Chronic anemia 12/08/2017   History of expressive aphasia 10/15/2017   Seizure disorder (HCC) 10/15/2017   Dysphagia 10/15/2017   Hyperlipidemia 09/22/2017   Urinary frequency 09/22/2017   Preoperative clearance 11/19/2016   Airway hyperreactivity 01/26/2016   Disease of thyroid  gland 01/26/2016   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    Other specified postprocedural states 01/26/2016   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   Chronic low back pain 04/10/2015   Degeneration of intervertebral disc of lumbar region 04/10/2015   Spondylolisthesis of lumbar region 04/10/2015   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   History of cardiac pacemaker in situ 01/09/2015   B12 DEFICIENCY 08/15/2008   Vitamin D  deficiency 08/15/2008   Sick sinus syndrome (HCC) 08/15/2008   OBSTRUCTIVE SLEEP APNEA 08/11/2008   INSOMNIA 04/28/2008   Hyperlipidemia LDL goal <70 12/12/2006   RHINITIS, ALLERGIC NEC 12/12/2006   Hypothyroidism 12/11/2006   Essential hypertension 12/11/2006   GERD 12/11/2006   STRESS INCONTINENCE 12/11/2006      Subjective:    Patient ID: Hulda Trudy Collet, female    DOB: November 21, 1950, 73 y.o.   MRN: [id number]  Chief Complaint  Patient presents with   Follow-up    Cefazolin  end date 10/30?    HPI:  Karmyn Marcellene Shivley is a 73 y.o. female here for f/u hospital admission msse bacteremia/pacemaker infection  She came from [facility] No labs present Mar reviewed -- cefazolin  Patient aphasic/left hemiparesis/wheelchair-bedbound Here with one of the nursing home cna  No n/v/diarrhea, rash According to nursing home staff, patient is at baseline     Allergies  Allergen Reactions   Tape Rash   Demerol [Meperidine Hcl] Nausea And Vomiting   Sulfonamide Derivatives Itching   Bacitracin-Polymyxin B Dermatitis    Not listed on the [facility]   Oxycontin  [Oxycodone ] Itching      Outpatient Medications Prior to Visit  Medication Sig Dispense Refill   acetaminophen  (TYLENOL ) 160 MG/5ML solution Place 20 mLs (640 mg total) into feeding  tube every 8 (eight) hours. 120 mL 0   acetic acid  0.25 % irrigation 20cc irrigation twice a day, flush foley to prevent clogging     amiodarone  (PACERONE ) 200 MG tablet Place 1 tablet (200 mg total) into feeding tube daily.     ascorbic acid  (VITAMIN C ) 500 MG tablet Place 0.5  tablets (250 mg total) into feeding tube 2 (two) times daily.     aspirin  81 MG chewable tablet Place 81 mg into feeding tube at bedtime.     ceFAZolin  (ANCEF ) IVPB Inject 2 g into the vein every 8 (eight) hours. 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 91 Units 0   chlorhexidine  (PERIDEX ) 0.12 % solution Dab a toothbrush into 15 mLs and brush teeth 2 times a day     Cranberry 425 MG CAPS Take 425 mg by mouth in the morning and at bedtime.     cyclobenzaprine  (FLEXERIL ) 5 MG tablet Place 2.5 mg into feeding tube in the morning and at bedtime. Hold If Sedated     docusate sodium  (COLACE) 100 MG capsule Give 1 capsule per tube twice a day     estradiol (ESTRACE) 0.1 MG/GM vaginal cream Place vaginally See admin instructions. Apply a pea-sized amount to the urethra/vaginal wall on Tuesdays and Fridays     famotidine  (PEPCID ) 40 MG/5ML suspension Place 40 mg into feeding tube daily.     folic acid  (FOLVITE ) 800 MCG tablet Place 800 mcg into feeding tube daily.     HYDROcodone -acetaminophen  (NORCO/VICODIN) 5-325 MG tablet Place 1 tablet into feeding tube 2 (two) times daily as needed for moderate pain (pain score 4-6).     lactulose  (CHRONULAC ) 10 GM/15ML solution Take 30 g by mouth daily.     levETIRAcetam  (KEPPRA ) 100 MG/ML solution Place 1,000 mg into feeding tube 2 (two) times daily.     losartan  (COZAAR ) 25 MG tablet Place 1 tablet (25 mg total) into feeding tube daily.     Menthol , Topical Analgesic, (BIOFREEZE) 4 % GEL Apply 2 g topically See admin instructions. Apply 2 grams topically to all limbs 2 times a  day     metoprolol  tartrate (LOPRESSOR ) 25 MG tablet Place 1 tablet (25 mg total) into feeding tube 2 (two) times daily.     Multiple Vitamin (MULTIVITAMIN WITH MINERALS) TABS tablet Place 1 tablet into feeding tube daily.     nystatin  ointment (MYCOSTATIN ) Apply topically.     simethicone  (MYLICON) 80 MG chewable tablet Place 80 mg into feeding tube 3 (three) times daily.     simvastatin  (ZOCOR ) 10 MG tablet Place 1 tablet (10 mg total) into feeding tube at bedtime.     torsemide  (DEMADEX ) 10 MG tablet Give 10 mg per tube every 12 hours for edema, hold for a systolic reading less than 110     valproic  acid (DEPAKENE ) 250 MG/5ML solution Place 500-750 mg into feeding tube See admin instructions. 500 mg via tube three times a day and 750 mg at bedtime     diazePAM , 20 MG Dose, (VALTOCO  20 MG DOSE) 2 x 10 MG/0.1ML LQPK Place 20 mg into the nose daily as needed (seizures).  lip balm (CARMEX) ointment Apply topically as needed. (Patient not taking: Reported on 05/06/2024)     polyethylene glycol (MIRALAX  / GLYCOLAX ) 17 g packet Place 34 g into feeding tube daily. (Patient not taking: Reported on 05/06/2024)     Protein (FEEDING SUPPLEMENT, PROSOURCE TF20,) liquid Place 60 mLs into feeding tube 2 (two) times daily. (Patient not taking: Reported on 05/06/2024)     No facility-administered medications prior to visit.     Social History   Socioeconomic History   Marital status: Divorced    Spouse name: Not on file   Number of children: 3   Years of education: 78yrs   Highest education level: Not on file  Occupational History   Occupation: Retired    Associate Professor: [employer]    Comment: social services  Tobacco Use   Smoking status: Former    Current packs/day: 0.00    Types: Cigarettes    Start date: 07/30/1975    Quit date: 07/30/1979    Years since quitting: 44.8   Smokeless tobacco: Never  Vaping Use   Vaping status: Never Used  Substance and Sexual Activity   Alcohol use: No     Alcohol/week: 0.0 standard drinks of alcohol   Drug use: No   Sexual activity: Never    Partners: Male  Other Topics Concern   Not on file  Social History Narrative   Patient lives at home alone.   Caffeine Use: 1 cup daily   1 dog   Social Drivers of Corporate investment banker Strain: Not on file  Food Insecurity: Patient Unable To Answer (03/25/2024)   Hunger Vital Sign    Worried About Running Out of Food in the Last Year: Patient unable to answer    Ran Out of Food in the Last Year: Patient unable to answer  Transportation Needs: Patient Unable To Answer (03/25/2024)   PRAPARE - Transportation    Lack of Transportation (Medical): Patient unable to answer    Lack of Transportation (Non-Medical): Patient unable to answer  Physical Activity: Not on file  Stress: Not on file  Social Connections: Unknown (03/25/2024)   Social Connection and Isolation Panel    Frequency of Communication with Friends and Family: Patient unable to answer    Frequency of Social Gatherings with Friends and Family: Patient unable to answer    Attends Religious Services: Patient unable to answer    Active Member of Clubs or Organizations: Not on file    Attends Banker Meetings: Patient unable to answer    Marital Status: Patient unable to answer  Intimate Partner Violence: Patient Unable To Answer (03/25/2024)   Humiliation, Afraid, Rape, and Kick questionnaire    Fear of Current or Ex-Partner: Patient unable to answer    Emotionally Abused: Patient unable to answer    Physically Abused: Patient unable to answer    Sexually Abused: Patient unable to answer      Review of Systems    All other ros negative  Objective:    BP (!) 67/45   Pulse 71   Temp 98.2 F (36.8 C) (Temporal)   SpO2 94%  Nursing note and vital signs reviewed.  Physical Exam     General/constitutional: no distress, nonverbal, occasional moan which is baseline; interactive; in wheel chair; nursing home  staff at bedside HEENT: Normocephalic, PER, Conj Clear, EOMI, Oropharynx clear Neck supple CV: rrr no mrg Lungs: clear to auscultation, normal respiratory effort Abd: Soft, Nontender -- peg tube in  place Ext: no edema Skin: No Rash Neuro: right hemiparesis; overall bedbound/wheelchair bound; stiff extremities  Gu: folley in place yellow clear urine no sediment   Central line presence: rue picc site no erythema/purulence   Labs:  Micro:  Serology:  Imaging:  Assessment & Plan:   Problem List Items Addressed This Visit     Bacteremia   Other Visit Diagnoses       Pacemaker infection, subsequent encounter    -  Primary   Relevant Medications   nystatin  ointment (MYCOSTATIN )   Other Relevant Orders   CBC   COMPLETE METABOLIC PANEL WITHOUT GFR   Blood culture (routine single)   Blood culture (routine single)         No orders of the defined types were placed in this encounter.    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 We couldn't obtain a manual blood pressure today, although I could feel her radial pulse and brachial pulse. She is not tachycardic/diaphoretic. She is drowsy and nursing home staff said she has been more drowsy today. Her urine is clear yellow doesn't appear concentrated   -I will have her monitored closely at the nursing home for the next 24 hours and if drowsiness persists/worsens with fever, chill or unable to get blood pressure then go to  ER -nursing home to remove picc today -labs here cbc, cmp, blood cx 2 sets -stop cefazolin  -start cefadroxil 1000 mg po bid -follow up 6 weeks    Follow-up: Return in about 6 weeks (around 06/17/2024).      Constance ONEIDA Passer, MD [facility] 05/06/2024, 11:09 AM

## 2024-05-06 NOTE — Telephone Encounter (Signed)
 Received call from [name] with [facility] to clarify cefadroxil order. Relayed that cefadroxil should be 1000 mg PO BID. She states patient cannot take anything by mouth and all medications are administered via peg. Routing to provider to ensure cefadroxil can be administered this way.   [phone]   Ryker Pherigo, BSN, RN

## 2024-05-06 NOTE — Addendum Note (Signed)
 Addended by: Rosina Cressler M on: 05/06/2024 02:32 PM   Modules accepted: Orders

## 2024-05-06 NOTE — Patient Instructions (Signed)
 See nursing home sheet at [facility]

## 2024-05-06 NOTE — Telephone Encounter (Signed)
 There actually is an oral suspension available. Not sure how well insurance would cover it. There does not seem to be enough data to support crushing it. I will say other oral cephs have been investigated more and are okay to be crushed.

## 2024-05-07 MED ORDER — CEFADROXIL 500 MG/5ML PO SUSR: 1000.0000 mg | Freq: Two times a day (BID) | ORAL | 5 refills | Status: DC

## 2024-05-07 NOTE — Telephone Encounter (Signed)
 RX FAXED TO [facility]EE[facility] PRESIDENT WITH [facility] [phone]

## 2024-05-07 NOTE — Addendum Note (Signed)
 Addended by: OVERTON FAITH T on: 05/07/2024 11:41 AM   Modules accepted: Orders

## 2024-05-12 LAB — CULTURE, BLOOD (SINGLE)
MICRO NUMBER:: 17079891
MICRO NUMBER:: 17079892

## 2024-05-19 ENCOUNTER — Inpatient Hospital Stay (HOSPITAL_COMMUNITY)
Admission: EM | Admit: 2024-05-19 | Discharge: 2024-05-31 | DRG: 698 | Disposition: A | Discharge status: Skilled Nursing Facility | Source: Skilled Nursing Facility | Attending: Internal Medicine | Admitting: Internal Medicine

## 2024-05-19 ENCOUNTER — Encounter (HOSPITAL_COMMUNITY): Payer: Self-pay | Admitting: Emergency Medicine

## 2024-05-19 ENCOUNTER — Emergency Department (HOSPITAL_COMMUNITY)

## 2024-05-19 ENCOUNTER — Other Ambulatory Visit: Payer: Self-pay

## 2024-05-19 ENCOUNTER — Other Ambulatory Visit (HOSPITAL_COMMUNITY): Payer: Self-pay | Admitting: Internal Medicine

## 2024-05-19 ENCOUNTER — Ambulatory Visit (HOSPITAL_COMMUNITY)

## 2024-05-19 DIAGNOSIS — E861 Hypovolemia: Secondary | ICD-10-CM | POA: Diagnosis present

## 2024-05-19 DIAGNOSIS — I639 Cerebral infarction, unspecified: Secondary | ICD-10-CM | POA: Diagnosis present

## 2024-05-19 DIAGNOSIS — K9402 Colostomy infection: Secondary | ICD-10-CM | POA: Diagnosis present

## 2024-05-19 DIAGNOSIS — I69951 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: Secondary | ICD-10-CM

## 2024-05-19 DIAGNOSIS — Z950 Presence of cardiac pacemaker: Secondary | ICD-10-CM

## 2024-05-19 DIAGNOSIS — L89154 Pressure ulcer of sacral region, stage 4: Secondary | ICD-10-CM | POA: Diagnosis present

## 2024-05-19 DIAGNOSIS — R633 Feeding difficulties, unspecified: Secondary | ICD-10-CM

## 2024-05-19 DIAGNOSIS — G40909 Epilepsy, unspecified, not intractable, without status epilepticus: Secondary | ICD-10-CM | POA: Diagnosis present

## 2024-05-19 DIAGNOSIS — Z7401 Bed confinement status: Secondary | ICD-10-CM

## 2024-05-19 DIAGNOSIS — E872 Acidosis, unspecified: Secondary | ICD-10-CM

## 2024-05-19 DIAGNOSIS — N179 Acute kidney failure, unspecified: Secondary | ICD-10-CM | POA: Diagnosis present

## 2024-05-19 DIAGNOSIS — T83511A Infection and inflammatory reaction due to indwelling urethral catheter, initial encounter: Secondary | ICD-10-CM | POA: Diagnosis not present

## 2024-05-19 DIAGNOSIS — Z66 Do not resuscitate: Secondary | ICD-10-CM | POA: Diagnosis present

## 2024-05-19 DIAGNOSIS — R4589 Other symptoms and signs involving emotional state: Secondary | ICD-10-CM

## 2024-05-19 DIAGNOSIS — E66811 Obesity, class 1: Secondary | ICD-10-CM | POA: Diagnosis present

## 2024-05-19 DIAGNOSIS — D649 Anemia, unspecified: Principal | ICD-10-CM

## 2024-05-19 DIAGNOSIS — I480 Paroxysmal atrial fibrillation: Secondary | ICD-10-CM | POA: Diagnosis present

## 2024-05-19 DIAGNOSIS — N3000 Acute cystitis without hematuria: Secondary | ICD-10-CM | POA: Diagnosis present

## 2024-05-19 DIAGNOSIS — E785 Hyperlipidemia, unspecified: Secondary | ICD-10-CM | POA: Diagnosis present

## 2024-05-19 DIAGNOSIS — I680 Cerebral amyloid angiopathy: Secondary | ICD-10-CM | POA: Diagnosis present

## 2024-05-19 DIAGNOSIS — E039 Hypothyroidism, unspecified: Secondary | ICD-10-CM | POA: Diagnosis present

## 2024-05-19 DIAGNOSIS — Z8249 Family history of ischemic heart disease and other diseases of the circulatory system: Secondary | ICD-10-CM

## 2024-05-19 DIAGNOSIS — Z7982 Long term (current) use of aspirin: Secondary | ICD-10-CM

## 2024-05-19 DIAGNOSIS — F0390 Unspecified dementia without behavioral disturbance: Secondary | ICD-10-CM | POA: Diagnosis present

## 2024-05-19 DIAGNOSIS — Z888 Allergy status to other drugs, medicaments and biological substances status: Secondary | ICD-10-CM

## 2024-05-19 DIAGNOSIS — A419 Sepsis, unspecified organism: Secondary | ICD-10-CM | POA: Diagnosis present

## 2024-05-19 DIAGNOSIS — Z79899 Other long term (current) drug therapy: Secondary | ICD-10-CM

## 2024-05-19 DIAGNOSIS — G8191 Hemiplegia, unspecified affecting right dominant side: Secondary | ICD-10-CM

## 2024-05-19 DIAGNOSIS — Z7189 Other specified counseling: Secondary | ICD-10-CM

## 2024-05-19 DIAGNOSIS — E1122 Type 2 diabetes mellitus with diabetic chronic kidney disease: Secondary | ICD-10-CM | POA: Diagnosis present

## 2024-05-19 DIAGNOSIS — Y833 Surgical operation with formation of external stoma as the cause of abnormal reaction of the patient, or of later complication, without mention of misadventure at the time of the procedure: Secondary | ICD-10-CM | POA: Diagnosis present

## 2024-05-19 DIAGNOSIS — K9423 Gastrostomy malfunction: Secondary | ICD-10-CM | POA: Diagnosis not present

## 2024-05-19 DIAGNOSIS — Z833 Family history of diabetes mellitus: Secondary | ICD-10-CM

## 2024-05-19 DIAGNOSIS — D631 Anemia in chronic kidney disease: Secondary | ICD-10-CM | POA: Diagnosis present

## 2024-05-19 DIAGNOSIS — Z79818 Long term (current) use of other agents affecting estrogen receptors and estrogen levels: Secondary | ICD-10-CM

## 2024-05-19 DIAGNOSIS — Z885 Allergy status to narcotic agent status: Secondary | ICD-10-CM

## 2024-05-19 DIAGNOSIS — E1165 Type 2 diabetes mellitus with hyperglycemia: Secondary | ICD-10-CM | POA: Diagnosis present

## 2024-05-19 DIAGNOSIS — E876 Hypokalemia: Secondary | ICD-10-CM | POA: Diagnosis present

## 2024-05-19 DIAGNOSIS — I959 Hypotension, unspecified: Secondary | ICD-10-CM | POA: Diagnosis present

## 2024-05-19 DIAGNOSIS — Y846 Urinary catheterization as the cause of abnormal reaction of the patient, or of later complication, without mention of misadventure at the time of the procedure: Secondary | ICD-10-CM | POA: Diagnosis present

## 2024-05-19 DIAGNOSIS — I129 Hypertensive chronic kidney disease with stage 1 through stage 4 chronic kidney disease, or unspecified chronic kidney disease: Secondary | ICD-10-CM | POA: Diagnosis present

## 2024-05-19 DIAGNOSIS — R569 Unspecified convulsions: Secondary | ICD-10-CM

## 2024-05-19 DIAGNOSIS — Z823 Family history of stroke: Secondary | ICD-10-CM

## 2024-05-19 DIAGNOSIS — Z882 Allergy status to sulfonamides status: Secondary | ICD-10-CM

## 2024-05-19 DIAGNOSIS — K9420 Gastrostomy complication, unspecified: Secondary | ICD-10-CM

## 2024-05-19 DIAGNOSIS — K219 Gastro-esophageal reflux disease without esophagitis: Secondary | ICD-10-CM | POA: Diagnosis present

## 2024-05-19 DIAGNOSIS — Z515 Encounter for palliative care: Secondary | ICD-10-CM

## 2024-05-19 DIAGNOSIS — Z87891 Personal history of nicotine dependence: Secondary | ICD-10-CM

## 2024-05-19 DIAGNOSIS — J45909 Unspecified asthma, uncomplicated: Secondary | ICD-10-CM | POA: Diagnosis present

## 2024-05-19 DIAGNOSIS — E870 Hyperosmolality and hypernatremia: Secondary | ICD-10-CM | POA: Diagnosis present

## 2024-05-19 DIAGNOSIS — R7989 Other specified abnormal findings of blood chemistry: Secondary | ICD-10-CM | POA: Diagnosis present

## 2024-05-19 DIAGNOSIS — Z792 Long term (current) use of antibiotics: Secondary | ICD-10-CM

## 2024-05-19 DIAGNOSIS — L03311 Cellulitis of abdominal wall: Secondary | ICD-10-CM | POA: Diagnosis present

## 2024-05-19 DIAGNOSIS — Z9884 Bariatric surgery status: Secondary | ICD-10-CM

## 2024-05-19 DIAGNOSIS — D696 Thrombocytopenia, unspecified: Secondary | ICD-10-CM | POA: Diagnosis present

## 2024-05-19 DIAGNOSIS — E871 Hypo-osmolality and hyponatremia: Secondary | ICD-10-CM | POA: Diagnosis present

## 2024-05-19 DIAGNOSIS — E854 Organ-limited amyloidosis: Secondary | ICD-10-CM | POA: Diagnosis present

## 2024-05-19 DIAGNOSIS — B965 Pseudomonas (aeruginosa) (mallei) (pseudomallei) as the cause of diseases classified elsewhere: Secondary | ICD-10-CM | POA: Diagnosis present

## 2024-05-19 DIAGNOSIS — Z6833 Body mass index (BMI) 33.0-33.9, adult: Secondary | ICD-10-CM

## 2024-05-19 DIAGNOSIS — N189 Chronic kidney disease, unspecified: Secondary | ICD-10-CM | POA: Diagnosis present

## 2024-05-19 DIAGNOSIS — Z800 Family history of malignant neoplasm of digestive organs: Secondary | ICD-10-CM

## 2024-05-19 DIAGNOSIS — E8721 Acute metabolic acidosis: Secondary | ICD-10-CM | POA: Diagnosis present

## 2024-05-19 DIAGNOSIS — Z91048 Other nonmedicinal substance allergy status: Secondary | ICD-10-CM

## 2024-05-19 DIAGNOSIS — I38 Endocarditis, valve unspecified: Secondary | ICD-10-CM | POA: Diagnosis present

## 2024-05-19 LAB — I-STAT CHEM 8, ED
BUN: 49 mg/dL — ABNORMAL HIGH (ref 8–23)
Calcium, Ion: 1.11 mmol/L — ABNORMAL LOW (ref 1.15–1.40)
Chloride: 105 mmol/L (ref 98–111)
Creatinine, Ser: 1.1 mg/dL — ABNORMAL HIGH (ref 0.44–1.00)
Glucose, Bld: 132 mg/dL — ABNORMAL HIGH (ref 70–99)
HCT: 38 % (ref 36.0–46.0)
Hemoglobin: 12.9 g/dL (ref 12.0–15.0)
Potassium: 4.3 mmol/L (ref 3.5–5.1)
Sodium: 145 mmol/L (ref 135–145)
TCO2: 28 mmol/L (ref 22–32)

## 2024-05-19 LAB — CBC
HCT: 36.1 % (ref 36.0–46.0)
Hemoglobin: 11.2 g/dL — ABNORMAL LOW (ref 12.0–15.0)
MCH: 33.4 pg (ref 26.0–34.0)
MCHC: 31 g/dL (ref 30.0–36.0)
MCV: 107.8 fL — ABNORMAL HIGH (ref 80.0–100.0)
Platelets: 140 K/uL — ABNORMAL LOW (ref 150–400)
RBC: 3.35 MIL/uL — ABNORMAL LOW (ref 3.87–5.11)
RDW: 13.7 % (ref 11.5–15.5)
WBC: 9.9 K/uL (ref 4.0–10.5)
nRBC: 0 % (ref 0.0–0.2)

## 2024-05-19 LAB — CBC WITH DIFFERENTIAL/PLATELET
Abs Immature Granulocytes: 0.07 K/uL (ref 0.00–0.07)
Basophils Absolute: 0 K/uL (ref 0.0–0.1)
Basophils Relative: 0 %
Eosinophils Absolute: 0.1 K/uL (ref 0.0–0.5)
Eosinophils Relative: 1 %
HCT: 21.7 % — ABNORMAL LOW (ref 36.0–46.0)
Hemoglobin: 6.6 g/dL — CL (ref 12.0–15.0)
Immature Granulocytes: 1 %
Lymphocytes Relative: 34 %
Lymphs Abs: 4.5 K/uL — ABNORMAL HIGH (ref 0.7–4.0)
MCH: 34 pg (ref 26.0–34.0)
MCHC: 30.4 g/dL (ref 30.0–36.0)
MCV: 111.9 fL — ABNORMAL HIGH (ref 80.0–100.0)
Monocytes Absolute: 2 K/uL — ABNORMAL HIGH (ref 0.1–1.0)
Monocytes Relative: 15 %
Neutro Abs: 6.6 K/uL (ref 1.7–7.7)
Neutrophils Relative %: 49 %
Platelets: 171 K/uL (ref 150–400)
RBC: 1.94 MIL/uL — ABNORMAL LOW (ref 3.87–5.11)
RDW: 14 % (ref 11.5–15.5)
Smear Review: NORMAL
WBC: 13.3 K/uL — ABNORMAL HIGH (ref 4.0–10.5)
nRBC: 0 % (ref 0.0–0.2)

## 2024-05-19 LAB — COMPREHENSIVE METABOLIC PANEL WITH GFR
ALT: 12 U/L (ref 0–44)
AST: 57 U/L — ABNORMAL HIGH (ref 15–41)
Albumin: 2.1 g/dL — ABNORMAL LOW (ref 3.5–5.0)
Alkaline Phosphatase: 214 U/L — ABNORMAL HIGH (ref 38–126)
Anion gap: 9 (ref 5–15)
BUN: 51 mg/dL — ABNORMAL HIGH (ref 8–23)
CO2: 26 mmol/L (ref 22–32)
Calcium: 8.5 mg/dL — ABNORMAL LOW (ref 8.9–10.3)
Chloride: 106 mmol/L (ref 98–111)
Creatinine, Ser: 0.91 mg/dL (ref 0.44–1.00)
GFR, Estimated: >60 mL/min (ref >60)
Glucose, Bld: 130 mg/dL — ABNORMAL HIGH (ref 70–99)
Potassium: 4.4 mmol/L (ref 3.5–5.1)
Sodium: 142 mmol/L (ref 135–145)
Total Bilirubin: 1 mg/dL (ref 0.0–1.2)
Total Protein: 9 g/dL — ABNORMAL HIGH (ref 6.5–8.1)

## 2024-05-19 LAB — ABO/RH: ABO/RH(D): B POS

## 2024-05-19 LAB — PREPARE RBC (CROSSMATCH)

## 2024-05-19 LAB — URINALYSIS, W/ REFLEX TO CULTURE (INFECTION SUSPECTED)
Bilirubin Urine: NEGATIVE
Glucose, UA: NEGATIVE mg/dL
Ketones, ur: NEGATIVE mg/dL
Nitrite: NEGATIVE
Protein, ur: 30 mg/dL — AB
RBC / HPF: >50 RBC/hpf (ref 0–5)
Specific Gravity, Urine: 1.014 (ref 1.005–1.030)
WBC, UA: >50 WBC/hpf (ref 0–5)
pH: 6 (ref 5.0–8.0)

## 2024-05-19 LAB — I-STAT CG4 LACTIC ACID, ED
Lactic Acid, Venous: 2.9 mmol/L (ref 0.5–1.9)
Lactic Acid, Venous: 2.8 mmol/L (ref 0.5–1.9)

## 2024-05-19 LAB — POC OCCULT BLOOD, ED: Fecal Occult Bld: NEGATIVE

## 2024-05-19 LAB — PROTIME-INR
INR: 1.5 — ABNORMAL HIGH (ref 0.8–1.2)
Prothrombin Time: 19 s — ABNORMAL HIGH (ref 11.4–15.2)

## 2024-05-19 MED ORDER — VALPROIC ACID 250 MG/5ML PO SOLN: 500.0000 mg | ORAL | Status: DC

## 2024-05-19 MED ORDER — LACTULOSE 10 GM/15ML PO SOLN: 30.0000 g | Freq: Every day | ORAL | Status: DC

## 2024-05-19 MED ORDER — SIMVASTATIN 20 MG PO TABS
10.0000 mg | ORAL_TABLET | Freq: Every day | ORAL | Status: DC
  Administered 2024-05-20 – 2024-05-30 (×11): 10 mg
  Filled 2024-05-19 (×12): qty 1

## 2024-05-19 MED ORDER — IOHEXOL 300 MG/ML  SOLN
100.0000 mL | Freq: Once | INTRAMUSCULAR | Status: AC | PRN
  Administered 2024-05-19: 100 mL via INTRAVENOUS

## 2024-05-19 MED ORDER — SIMETHICONE 80 MG PO CHEW
80.0000 mg | CHEWABLE_TABLET | Freq: Three times a day (TID) | ORAL | Status: DC
  Administered 2024-05-19 – 2024-05-30 (×31): 80 mg
  Filled 2024-05-19 (×32): qty 1

## 2024-05-19 MED ORDER — LACTATED RINGERS IV BOLUS
500.0000 mL | Freq: Once | INTRAVENOUS | Status: DC
Start: 2024-05-19 — End: 2024-05-23

## 2024-05-19 MED ORDER — CEFADROXIL 500 MG PO CAPS
1000.0000 mg | ORAL_CAPSULE | Freq: Two times a day (BID) | ORAL | Status: DC
  Administered 2024-05-19: 1000 mg
  Filled 2024-05-19 (×2): qty 2

## 2024-05-19 MED ORDER — TORSEMIDE 20 MG PO TABS: 10.0000 mg | ORAL_TABLET | Freq: Three times a day (TID) | ORAL | Status: DC

## 2024-05-19 MED ORDER — LACTATED RINGERS IV BOLUS: 1000.0000 mL | Freq: Once | INTRAVENOUS | Status: DC

## 2024-05-19 MED ORDER — CYCLOBENZAPRINE HCL 5 MG PO TABS
2.5000 mg | ORAL_TABLET | Freq: Two times a day (BID) | ORAL | Status: DC
  Administered 2024-05-19 – 2024-05-31 (×23): 2.5 mg
  Filled 2024-05-19 (×14): qty 1
  Filled 2024-05-19: qty 0.5
  Filled 2024-05-19 (×2): qty 1
  Filled 2024-05-19: qty 0.5
  Filled 2024-05-19 (×7): qty 1

## 2024-05-19 MED ORDER — ACETAMINOPHEN 325 MG PO TABS
650.0000 mg | ORAL_TABLET | Freq: Four times a day (QID) | ORAL | Status: DC | PRN
  Administered 2024-05-20: 650 mg via ORAL
  Filled 2024-05-19: qty 2

## 2024-05-19 MED ORDER — SODIUM CHLORIDE 0.9% IV SOLUTION: Freq: Once | INTRAVENOUS | Status: DC

## 2024-05-19 MED ORDER — VALPROIC ACID 250 MG/5ML PO SOLN
750.0000 mg | Freq: Every day | ORAL | Status: DC
  Administered 2024-05-19 – 2024-05-30 (×12): 750 mg
  Filled 2024-05-19 (×13): qty 15

## 2024-05-19 MED ORDER — LEVETIRACETAM 100 MG/ML PO SOLN
1000.0000 mg | Freq: Two times a day (BID) | ORAL | Status: DC
  Administered 2024-05-19 – 2024-05-31 (×23): 1000 mg
  Filled 2024-05-19 (×25): qty 10

## 2024-05-19 MED ORDER — VITAMIN C 500 MG PO TABS
250.0000 mg | ORAL_TABLET | Freq: Two times a day (BID) | ORAL | Status: DC
  Administered 2024-05-19: 250 mg
  Filled 2024-05-19: qty 1

## 2024-05-19 MED ORDER — PANTOPRAZOLE SODIUM 40 MG IV SOLR
40.0000 mg | Freq: Once | INTRAVENOUS | Status: AC
  Administered 2024-05-19: 40 mg via INTRAVENOUS
  Filled 2024-05-19: qty 10

## 2024-05-19 MED ORDER — LOSARTAN POTASSIUM 25 MG PO TABS
25.0000 mg | ORAL_TABLET | Freq: Every day | ORAL | Status: DC
  Administered 2024-05-19: 25 mg
  Filled 2024-05-19: qty 1

## 2024-05-19 MED ORDER — VALPROIC ACID 250 MG/5ML PO SOLN
500.0000 mg | Freq: Three times a day (TID) | ORAL | Status: DC
  Administered 2024-05-20 – 2024-05-31 (×33): 500 mg
  Filled 2024-05-19 (×36): qty 10

## 2024-05-19 MED ORDER — POLYETHYLENE GLYCOL 3350 17 G PO PACK: 17.0000 g | PACK | Freq: Every day | ORAL | Status: DC | PRN

## 2024-05-19 MED ORDER — FOLIC ACID 1 MG PO TABS
1.0000 mg | ORAL_TABLET | Freq: Every day | ORAL | Status: DC
  Administered 2024-05-20 – 2024-05-31 (×12): 1 mg
  Filled 2024-05-19 (×12): qty 1

## 2024-05-19 MED ORDER — AMIODARONE HCL 200 MG PO TABS
200.0000 mg | ORAL_TABLET | Freq: Every day | ORAL | Status: DC
  Administered 2024-05-20 – 2024-05-31 (×12): 200 mg
  Filled 2024-05-19 (×13): qty 1

## 2024-05-19 MED ORDER — DOCUSATE SODIUM 50 MG/5ML PO LIQD
100.0000 mg | Freq: Two times a day (BID) | ORAL | Status: DC
  Administered 2024-05-20 – 2024-05-31 (×19): 100 mg
  Filled 2024-05-19 (×25): qty 10

## 2024-05-19 MED ORDER — METOPROLOL TARTRATE 25 MG PO TABS
25.0000 mg | ORAL_TABLET | Freq: Two times a day (BID) | ORAL | Status: DC
  Administered 2024-05-19 – 2024-05-31 (×22): 25 mg
  Filled 2024-05-19 (×24): qty 1

## 2024-05-19 MED ORDER — ONDANSETRON HCL 4 MG/2ML IJ SOLN: 4.0000 mg | Freq: Four times a day (QID) | INTRAMUSCULAR | Status: DC | PRN

## 2024-05-19 MED ORDER — SODIUM CHLORIDE 0.9% FLUSH
3.0000 mL | Freq: Two times a day (BID) | INTRAVENOUS | Status: DC
  Administered 2024-05-20 – 2024-05-30 (×17): 3 mL via INTRAVENOUS

## 2024-05-19 MED ORDER — BISACODYL 10 MG RE SUPP: 10.0000 mg | Freq: Every day | RECTAL | Status: DC | PRN

## 2024-05-19 MED ORDER — LACTATED RINGERS IV BOLUS (SEPSIS)
1000.0000 mL | Freq: Once | INTRAVENOUS | Status: AC
  Administered 2024-05-19: 1000 mL via INTRAVENOUS

## 2024-05-19 MED ORDER — DOCUSATE SODIUM 100 MG PO CAPS: 100.0000 mg | ORAL_CAPSULE | Freq: Two times a day (BID) | ORAL | Status: DC

## 2024-05-19 MED ORDER — ADULT MULTIVITAMIN W/MINERALS CH
1.0000 | ORAL_TABLET | Freq: Every day | ORAL | Status: DC
  Administered 2024-05-20 – 2024-05-31 (×12): 1
  Filled 2024-05-19 (×12): qty 1

## 2024-05-19 MED ORDER — SODIUM CHLORIDE 0.9 % IV SOLN
1.0000 g | Freq: Once | INTRAVENOUS | Status: AC
  Administered 2024-05-19: 1 g via INTRAVENOUS
  Filled 2024-05-19: qty 10

## 2024-05-19 MED ORDER — ALBUTEROL SULFATE (2.5 MG/3ML) 0.083% IN NEBU: 2.5000 mg | INHALATION_SOLUTION | RESPIRATORY_TRACT | Status: DC | PRN

## 2024-05-19 MED ORDER — ACETAMINOPHEN 650 MG RE SUPP: 650.0000 mg | Freq: Four times a day (QID) | RECTAL | Status: DC | PRN

## 2024-05-19 MED ORDER — FAMOTIDINE 40 MG/5ML PO SUSR
40.0000 mg | Freq: Every day | ORAL | Status: DC
  Administered 2024-05-20 – 2024-05-31 (×12): 40 mg
  Filled 2024-05-19 (×13): qty 5

## 2024-05-19 MED ORDER — ONDANSETRON HCL 4 MG PO TABS: 4.0000 mg | ORAL_TABLET | Freq: Four times a day (QID) | ORAL | Status: DC | PRN

## 2024-05-19 NOTE — H&P (Addendum)
 History and Physical    Patient: Kathleen Jensen FMW:[id number] DOB: July 02, 1951 DOA: 05/19/2024 DOS: the patient was seen and examined on 05/19/2024 PCP: System, Provider Not In  Patient coming from: SNF  Chief Complaint:  Chief Complaint  Patient presents with   Infected G-tube site   HPI: Kathleen Jensen is a 73 y.o. female nursing home resident with medical history significant for h/o CVA now bedbound with chronic PEG tube, indwelling Foley and seizure disorder.  She has dementia and aphasia and right-sided hemiparesis as well as pacemaker. 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  She is not a surgical candidate.  She received IV cefazolin  for 6 weeks and will then be on lifetime suppression with cefadroxil starting October 10. She was sent in from the nursing home today because her PEG tube was leaking.  The patient is not able to provide any history.  Her evaluation in the ED revealed no leaking PEG tube and the CT of her abdomen pelvis shows that her PEG tube is in place with no obvious abnormalities.  He was however found to have an elevated lactic acid of 2.8 and a low hemoglobin of 6.3.  Her labs were repeated and her next hemoglobin was 12 so it is felt that 6.3 is an error.  It was rechecked 1 more time just to be sure and it was 11.6.  The ED doctor was concerned about the elevated lactic acid and that there may be some low-lying infection.  Her white blood cell count is normal.  She is afebrile and not tachycardic.  He has asked the hospitalist team to admit for observation.   Review of Systems: As mentioned in the history of present illness. All other systems reviewed and are negative. Past Medical History:  Diagnosis Date   Aphasia    Arthritis    Asthma    Cardiac pacemaker    Cerebral amyloid angiopathy (CODE)    CKD (chronic kidney disease)    Cognitive communication deficit    Diabetes mellitus without  complication (HCC)    resolved after gastric bypass   GERD (gastroesophageal reflux disease)    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 (HCC)    Hyperlipemia    Hypertension    Insomnia    Intracerebral hemorrhage, intraventricular (HCC)    Muscle weakness    Other abnormalities of gait and mobility    Seizures (HCC)    Stroke (HCC)    Unsteadiness on feet    Vitamin B deficiency    Past Surgical History:  Procedure Laterality Date   BACK SURGERY     Dec 18, 2016 Dr.Torrealba   BREAST BIOPSY Right    BREAST EXCISIONAL BIOPSY     CERVICAL SPINE SURGERY     C4   CESAREAN SECTION     GASTRIC BYPASS  03/2007   IR GJ TUBE CHANGE  11/16/2023   IR REPLC DUODEN/JEJUNO TUBE PERCUT W/FLUORO  01/27/2023   IR REPLC DUODEN/JEJUNO TUBE PERCUT W/FLUORO  03/07/2023   IR REPLC DUODEN/JEJUNO TUBE PERCUT W/FLUORO  04/30/2023   IR REPLC DUODEN/JEJUNO TUBE PERCUT W/FLUORO  09/02/2023   IR REPLC DUODEN/JEJUNO TUBE PERCUT W/FLUORO  09/24/2023   IR REPLC DUODEN/JEJUNO TUBE PERCUT W/FLUORO  04/02/2024   TONSILLECTOMY     TRANSESOPHAGEAL ECHOCARDIOGRAM (CATH LAB) N/A 04/01/2024   Procedure: TRANSESOPHAGEAL ECHOCARDIOGRAM;  Surgeon: Lonni Slain, MD;  Location: [facility];  Service: Cardiovascular;  Laterality: N/A;   Social History:  reports that she quit smoking about 44 years ago. She started smoking about 48 years ago. She has never used smokeless tobacco. She reports that she does not drink alcohol and does not use drugs.  Allergies  Allergen Reactions   Tape Rash   Demerol [Meperidine Hcl] Nausea And Vomiting   Sulfonamide Derivatives Itching   Bacitracin-Polymyxin B Dermatitis    Not listed on the [facility]   Oxycontin  [Oxycodone ] Itching    Family History  Problem Relation Age of Onset   High blood pressure Mother    Diabetes Mother    Diabetes Father    Stroke Father    Cancer Sister        Colorectal cancer    Cancer Brother        colon    Prior to Admission medications   Medication Sig Start Date End Date Taking? Authorizing Provider  acetaminophen  (TYLENOL ) 160 MG/5ML solution Place 20 mLs (640 mg total) into feeding tube every 8 (eight) hours. 12/03/22   Elgergawy, Brayton RAMAN, MD  acetic acid  0.25 % irrigation 20cc irrigation twice a day, flush foley to prevent clogging 11/06/23   [provider]  amiodarone  (PACERONE ) 200 MG tablet Place 1 tablet (200 mg total) into feeding tube daily. 12/03/22   Elgergawy, Brayton RAMAN, MD  ascorbic acid  (VITAMIN C ) 500 MG tablet Place 0.5 tablets (250 mg total) into feeding tube 2 (two) times daily. 06/09/23   Rai, Nydia POUR, MD  aspirin  81 MG chewable tablet Place 81 mg into feeding tube at bedtime.    [provider]  cefadroxil (DURICEF) 500 MG/5ML suspension Take 10 mLs (1,000 mg total) by mouth every 12 (twelve) hours. 05/07/24 11/03/24  Vu, Constance T, MD  chlorhexidine  (PERIDEX ) 0.12 % solution Dab a toothbrush into 15 mLs and brush teeth 2 times a day    [provider]  Cranberry 425 MG CAPS Take 425 mg by mouth in the morning and at bedtime.    [provider]  cyclobenzaprine  (FLEXERIL ) 5 MG tablet Place 2.5 mg into feeding tube in the morning and at bedtime. Hold If Sedated    [provider]  diazePAM , 20 MG Dose, (VALTOCO  20 MG DOSE) 2 x 10 MG/0.1ML LQPK Place 20 mg into the nose daily as needed (seizures).    [provider]  docusate sodium  (COLACE) 100 MG capsule Give 1 capsule per tube twice a day    [provider]  estradiol (ESTRACE) 0.1 MG/GM vaginal cream Place vaginally See admin instructions. Apply a pea-sized amount to the urethra/vaginal wall on Tuesdays and Fridays    [provider]  famotidine  (PEPCID ) 40 MG/5ML suspension Place 40 mg into feeding tube daily. 03/25/22   [provider]  folic acid  (FOLVITE ) 800 MCG tablet Place 800 mcg into feeding tube daily.     [provider]  HYDROcodone -acetaminophen  (NORCO/VICODIN) 5-325 MG tablet Place 1 tablet into feeding tube 2 (two) times daily as needed for moderate pain (pain score 4-6).    [provider]  lactulose  (CHRONULAC ) 10 GM/15ML solution Take 30 g by mouth daily. 11/07/23   [provider]  levETIRAcetam  (KEPPRA ) 100 MG/ML solution Place 1,000 mg into feeding tube 2 (two) times daily.    [provider]  lip balm (CARMEX) ointment Apply topically as needed. Patient not taking: Reported on 05/06/2024 04/06/24   Tharon Lung, MD  losartan  (COZAAR ) 25 MG tablet Place 1 tablet (25 mg  total) into feeding tube daily. 03/06/23   Odell Celinda Balo, MD  Menthol , Topical Analgesic, (BIOFREEZE) 4 % GEL Apply 2 g topically See admin instructions. Apply 2 grams topically to all limbs 2 times a day    [provider]  metoprolol  tartrate (LOPRESSOR ) 25 MG tablet Place 1 tablet (25 mg total) into feeding tube 2 (two) times daily. 12/03/22   Elgergawy, Brayton RAMAN, MD  Multiple Vitamin (MULTIVITAMIN WITH MINERALS) TABS tablet Place 1 tablet into feeding tube daily. 04/06/24   Tharon Lung, MD  nystatin  ointment (MYCOSTATIN ) Apply topically. 04/26/24   [provider]  polyethylene glycol (MIRALAX  / GLYCOLAX ) 17 g packet Place 34 g into feeding tube daily. Patient not taking: Reported on 05/06/2024 06/09/23   Rai, Nydia POUR, MD  Protein (FEEDING SUPPLEMENT, PROSOURCE TF20,) liquid Place 60 mLs into feeding tube 2 (two) times daily. Patient not taking: Reported on 05/06/2024 04/06/24   Tharon Lung, MD  simethicone  (MYLICON) 80 MG chewable tablet Place 80 mg into feeding tube 3 (three) times daily.    [provider]  simvastatin  (ZOCOR ) 10 MG tablet Place 1 tablet (10 mg total) into feeding tube at bedtime. 02/22/22   Cheryle Page, MD  torsemide  (DEMADEX ) 10 MG tablet Give 10 mg per tube every 12 hours for edema, hold for a systolic reading less than 110    [provider]  valproic  acid (DEPAKENE ) 250 MG/5ML solution Place 500-750 mg into feeding tube See admin instructions. 500 mg via tube three times a day and 750 mg at bedtime    [provider]    Physical Exam: Vitals:   05/19/24 1845 05/19/24 1958 05/19/24 2000 05/19/24 2051  BP: (!) 163/97  (!) 148/102   Pulse:  74 74   Resp:   20   Temp:   97.6 F (36.4 C) 98.7 F (37.1 C)  TempSrc:  Oral  Rectal  SpO2:  92% 96%    Physical Exam:  General: No acute distress, chronically ill appearing HEENT: Normocephalic, atraumatic, PERRL Oral mucous membranes very dry Cardiovascular: Normal rate and rhythm. Distal pulses faint Pulmonary: Normal pulmonary effort, normal breath sounds Gastrointestinal: Nondistended abdomen, soft, non-tender, normoactive bowel sounds, Peg tube Musculoskeletal:No pitting lower ext edema Lymphadenopathy: No cervical LAD. Skin: Skin is warm and dry. Neuro: alert but not attentive. Only answers simple questions and not reliable.  She will smile if you smile at her but not interactive PSYCH: not interactive   Data Reviewed:  Results for orders placed or performed during the hospital encounter of 05/19/24 (from the past 24 hours)  Urinalysis, w/ Reflex to Culture (Infection Suspected) -Urine, Catheterized; Indwelling urinary catheter     Status: Abnormal   Collection Time: 05/19/24  2:22 PM  Result Value Ref Range   Specimen Source URINE, CATHETERIZED    Color, Urine YELLOW YELLOW   APPearance HAZY (A) CLEAR   Specific Gravity, Urine 1.014 1.005 - 1.030   pH 6.0 5.0 - 8.0   Glucose, UA NEGATIVE NEGATIVE mg/dL   Hgb urine dipstick MODERATE (A) NEGATIVE   Bilirubin Urine NEGATIVE NEGATIVE   Ketones, ur NEGATIVE NEGATIVE mg/dL   Protein, ur 30 (A) NEGATIVE mg/dL   Nitrite NEGATIVE NEGATIVE   Leukocytes,Ua LARGE (A) NEGATIVE   RBC / HPF >50 0 - 5 RBC/hpf   WBC, UA >50 0 - 5 WBC/hpf   Bacteria, UA RARE (A) NONE SEEN   Squamous Epithelial / HPF  0-5 0 - 5 /HPF   WBC  Clumps PRESENT    Mucus PRESENT    Hyaline Casts, UA PRESENT   Comprehensive metabolic panel     Status: Abnormal   Collection Time: 05/19/24  2:42 PM  Result Value Ref Range   Sodium 142 135 - 145 mmol/L   Potassium 4.4 3.5 - 5.1 mmol/L   Chloride 106 98 - 111 mmol/L   CO2 26 22 - 32 mmol/L   Glucose, Bld 130 (H) 70 - 99 mg/dL   BUN 51 (H) 8 - 23 mg/dL   Creatinine, Ser 9.08 0.44 - 1.00 mg/dL   Calcium  8.5 (L) 8.9 - 10.3 mg/dL   Total Protein 9.0 (H) 6.5 - 8.1 g/dL   Albumin  2.1 (L) 3.5 - 5.0 g/dL   AST 57 (H) 15 - 41 U/L   ALT 12 0 - 44 U/L   Alkaline Phosphatase 214 (H) 38 - 126 U/L   Total Bilirubin 1.0 0.0 - 1.2 mg/dL   GFR, Estimated >39 >39 mL/min   Anion gap 9 5 - 15  CBC with Differential     Status: Abnormal   Collection Time: 05/19/24  2:42 PM  Result Value Ref Range   WBC 13.3 (H) 4.0 - 10.5 K/uL   RBC 1.94 (L) 3.87 - 5.11 MIL/uL   Hemoglobin 6.6 (LL) 12.0 - 15.0 g/dL   HCT 78.2 (L) 63.9 - 53.9 %   MCV 111.9 (H) 80.0 - 100.0 fL   MCH 34.0 26.0 - 34.0 pg   MCHC 30.4 30.0 - 36.0 g/dL   RDW 85.9 88.4 - 84.4 %   Platelets 171 150 - 400 K/uL   nRBC 0.0 0.0 - 0.2 %   Neutrophils Relative % 49 %   Neutro Abs 6.6 1.7 - 7.7 K/uL   Lymphocytes Relative 34 %   Lymphs Abs 4.5 (H) 0.7 - 4.0 K/uL   Monocytes Relative 15 %   Monocytes Absolute 2.0 (H) 0.1 - 1.0 K/uL   Eosinophils Relative 1 %   Eosinophils Absolute 0.1 0.0 - 0.5 K/uL   Basophils Relative 0 %   Basophils Absolute 0.0 0.0 - 0.1 K/uL   WBC Morphology MORPHOLOGY UNREMARKABLE    RBC Morphology MORPHOLOGY UNREMARKABLE    Smear Review Normal platelet morphology    Immature Granulocytes 1 %   Abs Immature Granulocytes 0.07 0.00 - 0.07 K/uL  Protime-INR     Status: Abnormal   Collection Time: 05/19/24  2:42 PM  Result Value Ref Range   Prothrombin Time 19.0 (H) 11.4 - 15.2 seconds   INR 1.5 (H) 0.8 - 1.2  I-Stat Lactic Acid, ED     Status: Abnormal   Collection Time: 05/19/24  2:54 PM   Result Value Ref Range   Lactic Acid, Venous 2.9 (HH) 0.5 - 1.9 mmol/L   Comment NOTIFIED PHYSICIAN   I-stat chem 8, ED (not at [facility], [facility] or [facility])     Status: Abnormal   Collection Time: 05/19/24  2:55 PM  Result Value Ref Range   Sodium 145 135 - 145 mmol/L   Potassium 4.3 3.5 - 5.1 mmol/L   Chloride 105 98 - 111 mmol/L   BUN 49 (H) 8 - 23 mg/dL   Creatinine, Ser 8.89 (H) 0.44 - 1.00 mg/dL   Glucose, Bld 867 (H) 70 - 99 mg/dL   Calcium , Ion 1.11 (L) 1.15 - 1.40 mmol/L   TCO2 28 22 - 32 mmol/L   Hemoglobin 12.9 12.0 - 15.0 g/dL   HCT 61.9 63.9 - 53.9 %  POC occult blood, ED     Status: None   Collection Time: 05/19/24  3:52 PM  Result Value Ref Range   Fecal Occult Bld NEGATIVE NEGATIVE  Prepare RBC (crossmatch)     Status: None   Collection Time: 05/19/24  4:00 PM  Result Value Ref Range   Order Confirmation      ORDER PROCESSED BY BLOOD BANK Performed at [facility], [address] [place], [place] [address]   Type and screen [facility]     Status: None (Preliminary result)   Collection Time: 05/19/24  5:00 PM  Result Value Ref Range   ABO/RH(D) B POS    Antibody Screen NEG    Sample Expiration 05/22/2024,2359    Unit Number [id number]    Blood Component Type RED CELLS,LR    Unit division 00    Status of Unit ALLOCATED    Transfusion Status OK TO TRANSFUSE    Crossmatch Result      Compatible Performed at [facility], [address], [place], [place] [address]    Unit Number [id number]    Blood Component Type RED CELLS,LR    Unit division 00    Status of Unit ALLOCATED    Transfusion Status OK TO TRANSFUSE    Crossmatch Result Compatible   I-Stat Lactic Acid, ED     Status: Abnormal   Collection Time: 05/19/24  5:09 PM  Result Value Ref Range   Lactic Acid, Venous 2.8 (HH) 0.5 - 1.9 mmol/L   Comment NOTIFIED PHYSICIAN   ABO/Rh     Status: None   Collection Time: 05/19/24  6:25 PM  Result Value  Ref Range   ABO/RH(D)      B POS Performed at [facility], [address], [place], [place] [address]    IMPRESSION: 1. Stable percutaneous jejunostomy tube, without evidence of complication. 2. Chronic gaseous distention of the colon, likely representing chronic adynamic colonic ileus. 3. Complex upper pole left renal cyst, with peripheral nodularity. Further evaluation with nonemergent outpatient dedicated renal CT is recommended if the patient would be a therapy candidate should neoplasm be detected. 4.  Aortic Atherosclerosis (ICD10-I70.0).    Assessment and Plan: Leaking G tube - CT unremarkable.  Wound care consult  2. Initial hgb =6.6, repeat x 2 hgb 12 and 11.2. No need for transfusion. She was Hemoccult negative.  3.  Elevated lactic acid - the ED doctor was concerned about this and possible underlying infection.  Her urine is bloody but there is no clear infection.  She is on chronic antibiotics because of her endocarditis as well.  She is afebrile and has a normal white blood cell count.  - Will hydrate gently and monitor lactic acid level.  I do not see any evidence of sepsis or new infection - Continue chronic indwelling Foley  4.  Endocarditis - continue lifelong oral antibiotics.   Med list is still pending.    Advance Care Planning:   Code Status: Prior  She has a MOST form that says she is full code.  Consults: none  Family Communication: none  Severity of Illness: The appropriate patient status for this patient is OBSERVATION. Observation status is judged to be reasonable and necessary in order to provide the required intensity of service to ensure the patient's safety. The patient's presenting symptoms, physical exam findings, and initial radiographic and laboratory data in the context of their medical condition is felt to place them  at decreased risk for further clinical deterioration. Furthermore, it is anticipated that the patient  will be medically stable for discharge from the hospital within 2 midnights of admission.   Author: ARTHEA CHILD, MD 05/19/2024 9:26 PM  For on call review [email or web address].

## 2024-05-19 NOTE — ED Triage Notes (Signed)
 BIB EMS from [facility], leaking g tube, infected around site, (staff noticed today) CBG 162, 120/80-90-88% RA, 98 % on 6 liters. Answers questions by nodding head, decline in mental status.

## 2024-05-19 NOTE — ED Provider Notes (Signed)
  Physical Exam  BP 113/73   Pulse 60   Temp 98.7 F (37.1 C) (Rectal)   Resp (!) 22   SpO2 93%   Physical Exam Vitals and nursing note reviewed.  Constitutional:      General: She is not in acute distress.    Appearance: She is well-developed.  HENT:     Head: Normocephalic and atraumatic.  Eyes:     Conjunctiva/sclera: Conjunctivae normal.  Cardiovascular:     Rate and Rhythm: Normal rate and regular rhythm.     Heart sounds: No murmur heard. Pulmonary:     Effort: Pulmonary effort is normal. No respiratory distress.     Breath sounds: Normal breath sounds.  Abdominal:     Palpations: Abdomen is soft.     Tenderness: There is no abdominal tenderness.  Musculoskeletal:        General: No swelling.     Cervical back: Neck supple.  Skin:    General: Skin is warm and dry.     Capillary Refill: Capillary refill takes less than 2 seconds.  Neurological:     Mental Status: She is alert.  Psychiatric:        Mood and Affect: Mood normal.     Procedures  Procedures  ED Course / MDM   Clinical Course as of 05/19/24 1703  Wed May 19, 2024  1527 CBC with Differential(!!) Hemoglobin decreased to 6.6 [JK]  1527 Urinalysis, w/ Reflex to Culture (Infection Suspected) -Urine, Catheterized; Indwelling urinary catheter(!) Urinalysis suggestive of UTI [JK]  1527 I-Stat Lactic Acid, ED(!!) Lactic acid elevated [JK]  1539 Comprehensive metabolic panel(!) Metabolic panel does shows increased BUN concerning for the possibility of upper GI bleeding. [JK]  1539 Rectal exam performed no melena no gross blood [JK]    Clinical Course User Index [JK] Randol Simmonds, MD   Medical Decision Making Amount and/or Complexity of Data Reviewed Labs: ordered. Decision-making details documented in ED Course. Radiology: ordered.  Risk Prescription drug management. Decision regarding hospitalization.   Patient was brought into the ED.  Was concerned about possible infection around G-tube  site.  Looks more just reaction to fluid and irritation as opposed to any kind of obvious cellulitis or abscess.  She does have decreased hemoglobin count.  Concern for possible GI bleed started on PPI.  Stool guaiac negative.  CT scan unremarkable.  Will need to be admitted in the setting of hemoglobin drop.  Upon reevaluation.  Hemoglobin was actually due to inaccurate lab.  Repeated and is normal.  Patient still has ongoing lactic acid elevation.  Will be observed overnight to assessment, hemodynamic changes given recent hospitalization in the setting of endocarditis.     Simon Lavonia SAILOR, MD 05/19/24 2322

## 2024-05-19 NOTE — ED Provider Notes (Signed)
 [place] EMERGENCY DEPARTMENT AT [facility] Provider Note   CSN: [id number] Arrival date & time: 05/19/24  1303     Patient presents with: Infected G-tube site   Kathleen Jensen is a 73 y.o. female.   HPI   Patient has a complicated medical history including hypertension, seizures, diabetes, hypothyroidism, prior stroke, Ogilvie syndrome, sepsis, paroxysmal atrial FaBB, delirium, bacteremia.  Patient currently resides at a nursing facility.  She was last admitted to the hospital on August 28 for bacteremia and bacterial endocarditis.  After the nursing facility called EMS to bring the patient for evaluation.  She has had some leaking of her G-tube.  Staff today noted that she was has noticed swelling and redness around that site.  EMS also noted that she initially had a new oxygen requirement.  Patient is able to nod her head but not able to verbally communicate.  She has right-sided hemiparesis.  Patient unable to provide additional history with her baseline neurologic deficits from stroke  Prior to Admission medications   Medication Sig Start Date End Date Taking? Authorizing Provider  acetaminophen  (TYLENOL ) 160 MG/5ML solution Place 20 mLs (640 mg total) into feeding tube every 8 (eight) hours. 12/03/22   Elgergawy, Brayton RAMAN, MD  acetic acid  0.25 % irrigation 20cc irrigation twice a day, flush foley to prevent clogging 11/06/23   [provider]  amiodarone  (PACERONE ) 200 MG tablet Place 1 tablet (200 mg total) into feeding tube daily. 12/03/22   Elgergawy, Brayton RAMAN, MD  ascorbic acid  (VITAMIN C ) 500 MG tablet Place 0.5 tablets (250 mg total) into feeding tube 2 (two) times daily. 06/09/23   Rai, Nydia POUR, MD  aspirin  81 MG chewable tablet Place 81 mg into feeding tube at bedtime.    [provider]  cefadroxil (DURICEF) 500 MG/5ML suspension Take 10 mLs (1,000 mg total) by mouth every 12 (twelve) hours. 05/07/24 11/03/24  Vu, Constance T, MD   chlorhexidine  (PERIDEX ) 0.12 % solution Dab a toothbrush into 15 mLs and brush teeth 2 times a day    [provider]  Cranberry 425 MG CAPS Take 425 mg by mouth in the morning and at bedtime.    [provider]  cyclobenzaprine  (FLEXERIL ) 5 MG tablet Place 2.5 mg into feeding tube in the morning and at bedtime. Hold If Sedated    [provider]  diazePAM , 20 MG Dose, (VALTOCO  20 MG DOSE) 2 x 10 MG/0.1ML LQPK Place 20 mg into the nose daily as needed (seizures).    [provider]  docusate sodium  (COLACE) 100 MG capsule Give 1 capsule per tube twice a day    [provider]  estradiol (ESTRACE) 0.1 MG/GM vaginal cream Place vaginally See admin instructions. Apply a pea-sized amount to the urethra/vaginal wall on Tuesdays and Fridays    [provider]  famotidine  (PEPCID ) 40 MG/5ML suspension Place 40 mg into feeding tube daily. 03/25/22   [provider]  folic acid  (FOLVITE ) 800 MCG tablet Place 800 mcg into feeding tube daily.    [provider]  HYDROcodone -acetaminophen  (NORCO/VICODIN) 5-325 MG tablet Place 1 tablet into feeding tube 2 (two) times daily as needed for moderate pain (pain score 4-6).    [provider]  lactulose  (CHRONULAC ) 10 GM/15ML solution Take 30 g by mouth daily. 11/07/23   [provider]  levETIRAcetam  (KEPPRA ) 100 MG/ML solution Place 1,000 mg into feeding tube 2 (two) times daily.    [provider]  lip  balm (CARMEX) ointment Apply topically as needed. Patient not taking: Reported on 05/06/2024 04/06/24   Tharon Lung, MD  losartan  (COZAAR ) 25 MG tablet Place 1 tablet (25 mg total) into feeding tube daily. 03/06/23   Odell Celinda Balo, MD  Menthol , Topical Analgesic, (BIOFREEZE) 4 % GEL Apply 2 g topically See admin instructions. Apply 2 grams topically to all limbs 2 times a day    [provider]  metoprolol  tartrate (LOPRESSOR ) 25 MG tablet Place 1 tablet (25  mg total) into feeding tube 2 (two) times daily. 12/03/22   Elgergawy, Brayton RAMAN, MD  Multiple Vitamin (MULTIVITAMIN WITH MINERALS) TABS tablet Place 1 tablet into feeding tube daily. 04/06/24   Tharon Lung, MD  nystatin  ointment (MYCOSTATIN ) Apply topically. 04/26/24   [provider]  polyethylene glycol (MIRALAX  / GLYCOLAX ) 17 g packet Place 34 g into feeding tube daily. Patient not taking: Reported on 05/06/2024 06/09/23   Rai, Nydia POUR, MD  Protein (FEEDING SUPPLEMENT, PROSOURCE TF20,) liquid Place 60 mLs into feeding tube 2 (two) times daily. Patient not taking: Reported on 05/06/2024 04/06/24   Tharon Lung, MD  simethicone  (MYLICON) 80 MG chewable tablet Place 80 mg into feeding tube 3 (three) times daily.    [provider]  simvastatin  (ZOCOR ) 10 MG tablet Place 1 tablet (10 mg total) into feeding tube at bedtime. 02/22/22   Cheryle Page, MD  torsemide  (DEMADEX ) 10 MG tablet Give 10 mg per tube every 12 hours for edema, hold for a systolic reading less than 110    [provider]  valproic  acid (DEPAKENE ) 250 MG/5ML solution Place 500-750 mg into feeding tube See admin instructions. 500 mg via tube three times a day and 750 mg at bedtime    [provider]    Allergies: Tape, Demerol [meperidine hcl], Sulfonamide derivatives, Bacitracin-polymyxin b, and Oxycontin  [oxycodone ]    Review of Systems  Updated Vital Signs BP 113/73   Pulse 60   Temp 98.7 F (37.1 C) (Rectal)   Resp (!) 22   SpO2 93%   Physical Exam Vitals and nursing note reviewed.  Constitutional:      General: She is not in acute distress.    Appearance: She is well-developed. She is ill-appearing.     Comments: Elevated BMI  HENT:     Head: Normocephalic and atraumatic.     Right Ear: External ear normal.     Left Ear: External ear normal.  Eyes:     General: No scleral icterus.       Right eye: No discharge.        Left eye: No discharge.     Conjunctiva/sclera:  Conjunctivae normal.  Neck:     Trachea: No tracheal deviation.  Cardiovascular:     Rate and Rhythm: Normal rate and regular rhythm.  Pulmonary:     Effort: Pulmonary effort is normal. No respiratory distress.     Breath sounds: Normal breath sounds. No stridor. No wheezing or rales.  Abdominal:     General: Bowel sounds are normal. There is no distension.     Palpations: Abdomen is soft.     Tenderness: There is no abdominal tenderness. There is no guarding or rebound.     Comments: Protuberant, bilious type drainage noted around the G-tube, irritation and redness with some superficial ulceration of the skin surrounding the upper abdomen G-tube site does involve the inframammary fold  Musculoskeletal:        General: No deformity.  Cervical back: Neck supple.     Right lower leg: No edema.  Skin:    General: Skin is warm and dry.     Findings: No rash.  Neurological:     Mental Status: She is alert.     Cranial Nerves: No cranial nerve deficit, dysarthria or facial asymmetry.     Sensory: No sensory deficit.     Motor: Weakness present. No abnormal muscle tone or seizure activity.     Coordination: Coordination normal.     Comments: Right-sided hemiparesis, patient is able to wiggle her fingers and toes on the left side  Psychiatric:        Mood and Affect: Mood normal.     (all labs ordered are listed, but only abnormal results are displayed) Labs Reviewed  COMPREHENSIVE METABOLIC PANEL WITH GFR - Abnormal; Notable for the following components:      Result Value   Glucose, Bld 130 (*)    BUN 51 (*)    Calcium  8.5 (*)    Total Protein 9.0 (*)    Albumin  2.1 (*)    AST 57 (*)    Alkaline Phosphatase 214 (*)    All other components within normal limits  CBC WITH DIFFERENTIAL/PLATELET - Abnormal; Notable for the following components:   WBC 13.3 (*)    RBC 1.94 (*)    Hemoglobin 6.6 (*)    HCT 21.7 (*)    MCV 111.9 (*)    Lymphs Abs 4.5 (*)    Monocytes Absolute  2.0 (*)    All other components within normal limits  PROTIME-INR - Abnormal; Notable for the following components:   Prothrombin Time 19.0 (*)    INR 1.5 (*)    All other components within normal limits  URINALYSIS, W/ REFLEX TO CULTURE (INFECTION SUSPECTED) - Abnormal; Notable for the following components:   APPearance HAZY (*)    Hgb urine dipstick MODERATE (*)    Protein, ur 30 (*)    Leukocytes,Ua LARGE (*)    Bacteria, UA RARE (*)    All other components within normal limits  I-STAT CG4 LACTIC ACID, ED - Abnormal; Notable for the following components:   Lactic Acid, Venous 2.9 (*)    All other components within normal limits  I-STAT CHEM 8, ED - Abnormal; Notable for the following components:   BUN 49 (*)    Creatinine, Ser 1.10 (*)    Glucose, Bld 132 (*)    Calcium , Ion 1.11 (*)    All other components within normal limits  CULTURE, BLOOD (ROUTINE X 2)  CULTURE, BLOOD (ROUTINE X 2)  URINE CULTURE  POC OCCULT BLOOD, ED  I-STAT CG4 LACTIC ACID, ED  TYPE AND SCREEN  PREPARE RBC (CROSSMATCH)    EKG: EKG Interpretation Date/Time:  Wednesday May 19 2024 14:07:06 EDT Ventricular Rate:  60 PR Interval:  185 QRS Duration:  88 QT Interval:  500 QTC Calculation: 500 R Axis:   -15  Text Interpretation: Atrial pacing Abnormal R-wave progression, early transition Left ventricular hypertrophy Anterior Q waves, possibly due to LVH Confirmed by Randol Simmonds [phone]) on 05/19/2024 2:25:34 PM  Radiology: [facility] Chest Port 1 View Result Date: 05/19/2024 EXAM: 1 VIEW(S) XRAY OF THE CHEST 05/19/2024 01:56:39 PM COMPARISON: 06/29/2023 CLINICAL HISTORY: Questionable sepsis - evaluate for abnormality. FINDINGS: LUNGS AND PLEURA: Low lung volumes. Elevated right hemidiaphragm, unchanged. No focal pulmonary opacity. No pulmonary edema. No pleural effusion. No pneumothorax. HEART AND MEDIASTINUM: Aortic atherosclerosis. Left pacemaker in place. No  acute abnormality of the cardiac and  mediastinal silhouettes. BONES AND SOFT TISSUES: No acute osseous abnormality. IMPRESSION: 1. No acute cardiopulmonary process identified. Electronically signed by: Waddell Calk MD 05/19/2024 02:25 PM EDT RP Workstation: [id number]     .Critical Care  Performed by: Randol Simmonds, MD Authorized by: Randol Simmonds, MD   Critical care provider statement:    Critical care time (minutes):  45   Critical care was time spent personally by me on the following activities:  Development of treatment plan with patient or surrogate, discussions with consultants, evaluation of patient's response to treatment, examination of patient, ordering and review of laboratory studies, ordering and review of radiographic studies, ordering and performing treatments and interventions, pulse oximetry, re-evaluation of patient's condition and review of old charts    Medications Ordered in the ED  cefTRIAXone  (ROCEPHIN ) 1 g in sodium chloride  0.9 % 100 mL IVPB (has no administration in time range)  pantoprazole  (PROTONIX ) injection 40 mg (has no administration in time range)  0.9 %  sodium chloride  infusion (Manually program via Guardrails IV Fluids) (has no administration in time range)  lactated ringers  bolus 1,000 mL (1,000 mLs Intravenous New Bag/Given 05/19/24 1611)  iohexol  (OMNIPAQUE ) 300 MG/ML solution 100 mL (100 mLs Intravenous Contrast Given 05/19/24 1632)    Clinical Course as of 05/19/24 1653  Wed May 19, 2024  1527 CBC with Differential(!!) Hemoglobin decreased to 6.6 [JK]  1527 Urinalysis, w/ Reflex to Culture (Infection Suspected) -Urine, Catheterized; Indwelling urinary catheter(!) Urinalysis suggestive of UTI [JK]  1527 I-Stat Lactic Acid, ED(!!) Lactic acid elevated [JK]  1539 Comprehensive metabolic panel(!) Metabolic panel does shows increased BUN concerning for the possibility of upper GI bleeding. [JK]  1539 Rectal exam performed no melena no gross blood [JK]    Clinical Course User Index [JK]  Randol Simmonds, MD                                 Medical Decision Making Problems Addressed: Acute cystitis without hematuria: acute illness or injury that poses a threat to life or bodily functions Anemia, unspecified type: acute illness or injury that poses a threat to life or bodily functions  Amount and/or Complexity of Data Reviewed Labs: ordered. Decision-making details documented in ED Course. Radiology: ordered.  Risk Prescription drug management.   Patient presented to the ED for evaluation of possible infection associated with drainage around her G-tube site.  Patient at baseline is nonverbal and bedbound because of a prior stroke.  Is concerned about the possibility of electrolyte abnormality, evolving sepsis, GI bleeding, bowel obstruction with her symptoms.  Laboratory tests are notable for lactic acidosis concerning for the possibility of evolving sepsis.  Urinalysis does show signs of infection which may be the source.  Her labs however are notable for significant decrease in her hemoglobin down to 6.6.  She also has an elevated BUN concerning for possible GI bleeding.  Hemoccult was negative no melena.  I have ordered IV blood transfusions.  Patient has been started on IV antibiotics.  CT scan of her head and abdomen pelvis have also been ordered and those are currently pending.  Patient will require admission to the hospital.  Case turned over to Dr Jackquline at shift change pending CT scan results.     Final diagnoses:  Anemia, unspecified type  Acute cystitis without hematuria    ED Discharge Orders     None  Randol Simmonds, MD 05/19/24 [phone]

## 2024-05-19 NOTE — ED Notes (Addendum)
 Nurse to lab to obtain 1/2 units of blood and per lab tech Darice, Patient hemoglobin just came back as 10 per labs sent down about 30 mins ago. Lab will call when issue is resolved to verify if lab was correct. Hold off on transfusion at this time.

## 2024-05-19 NOTE — ED Notes (Signed)
 Unable to obtain repeat CBC to send down to lab. Patient IV from US  will not draw back.

## 2024-05-19 NOTE — ED Notes (Signed)
 Nurse reached out to ED staff for assistance with IV access.

## 2024-05-19 NOTE — ED Notes (Signed)
 Nurse able to obtain repeat CBC on patient at this time. CBC sent to lab

## 2024-05-19 NOTE — ED Notes (Signed)
 Provider Lavonia aware of unable to obtain repeat CBC.

## 2024-05-19 NOTE — ED Notes (Addendum)
 Another nurse in room at this time to see if she can obtain CBC collect from patient. Admit Dr at bedside at this time and aware of situation of why patient blood transfusion has not been started yet.

## 2024-05-19 NOTE — ED Notes (Signed)
 Patient cleansed due to being soiled with feces.

## 2024-05-20 DIAGNOSIS — K9402 Colostomy infection: Secondary | ICD-10-CM | POA: Diagnosis present

## 2024-05-20 DIAGNOSIS — K9420 Gastrostomy complication, unspecified: Secondary | ICD-10-CM

## 2024-05-20 DIAGNOSIS — E039 Hypothyroidism, unspecified: Secondary | ICD-10-CM | POA: Diagnosis present

## 2024-05-20 DIAGNOSIS — R4589 Other symptoms and signs involving emotional state: Secondary | ICD-10-CM | POA: Diagnosis not present

## 2024-05-20 DIAGNOSIS — J45909 Unspecified asthma, uncomplicated: Secondary | ICD-10-CM | POA: Diagnosis present

## 2024-05-20 DIAGNOSIS — Y833 Surgical operation with formation of external stoma as the cause of abnormal reaction of the patient, or of later complication, without mention of misadventure at the time of the procedure: Secondary | ICD-10-CM | POA: Diagnosis present

## 2024-05-20 DIAGNOSIS — G8191 Hemiplegia, unspecified affecting right dominant side: Secondary | ICD-10-CM | POA: Diagnosis not present

## 2024-05-20 DIAGNOSIS — I129 Hypertensive chronic kidney disease with stage 1 through stage 4 chronic kidney disease, or unspecified chronic kidney disease: Secondary | ICD-10-CM | POA: Diagnosis present

## 2024-05-20 DIAGNOSIS — E870 Hyperosmolality and hypernatremia: Secondary | ICD-10-CM | POA: Diagnosis present

## 2024-05-20 DIAGNOSIS — B965 Pseudomonas (aeruginosa) (mallei) (pseudomallei) as the cause of diseases classified elsewhere: Secondary | ICD-10-CM | POA: Diagnosis present

## 2024-05-20 DIAGNOSIS — D631 Anemia in chronic kidney disease: Secondary | ICD-10-CM | POA: Diagnosis present

## 2024-05-20 DIAGNOSIS — Z66 Do not resuscitate: Secondary | ICD-10-CM | POA: Diagnosis present

## 2024-05-20 DIAGNOSIS — L89154 Pressure ulcer of sacral region, stage 4: Secondary | ICD-10-CM | POA: Diagnosis present

## 2024-05-20 DIAGNOSIS — I69951 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: Secondary | ICD-10-CM | POA: Diagnosis not present

## 2024-05-20 DIAGNOSIS — A419 Sepsis, unspecified organism: Secondary | ICD-10-CM

## 2024-05-20 DIAGNOSIS — E854 Organ-limited amyloidosis: Secondary | ICD-10-CM | POA: Diagnosis present

## 2024-05-20 DIAGNOSIS — Z7401 Bed confinement status: Secondary | ICD-10-CM | POA: Diagnosis not present

## 2024-05-20 DIAGNOSIS — D696 Thrombocytopenia, unspecified: Secondary | ICD-10-CM | POA: Diagnosis present

## 2024-05-20 DIAGNOSIS — Z6833 Body mass index (BMI) 33.0-33.9, adult: Secondary | ICD-10-CM | POA: Diagnosis not present

## 2024-05-20 DIAGNOSIS — F0390 Unspecified dementia without behavioral disturbance: Secondary | ICD-10-CM | POA: Diagnosis present

## 2024-05-20 DIAGNOSIS — D649 Anemia, unspecified: Secondary | ICD-10-CM | POA: Diagnosis not present

## 2024-05-20 DIAGNOSIS — I639 Cerebral infarction, unspecified: Secondary | ICD-10-CM | POA: Diagnosis not present

## 2024-05-20 DIAGNOSIS — L03311 Cellulitis of abdominal wall: Secondary | ICD-10-CM | POA: Diagnosis present

## 2024-05-20 DIAGNOSIS — Y846 Urinary catheterization as the cause of abnormal reaction of the patient, or of later complication, without mention of misadventure at the time of the procedure: Secondary | ICD-10-CM | POA: Diagnosis present

## 2024-05-20 DIAGNOSIS — R7989 Other specified abnormal findings of blood chemistry: Secondary | ICD-10-CM | POA: Diagnosis not present

## 2024-05-20 DIAGNOSIS — N179 Acute kidney failure, unspecified: Secondary | ICD-10-CM | POA: Diagnosis present

## 2024-05-20 DIAGNOSIS — G40909 Epilepsy, unspecified, not intractable, without status epilepticus: Secondary | ICD-10-CM | POA: Diagnosis present

## 2024-05-20 DIAGNOSIS — E1122 Type 2 diabetes mellitus with diabetic chronic kidney disease: Secondary | ICD-10-CM | POA: Diagnosis present

## 2024-05-20 DIAGNOSIS — N3000 Acute cystitis without hematuria: Secondary | ICD-10-CM | POA: Diagnosis present

## 2024-05-20 DIAGNOSIS — I38 Endocarditis, valve unspecified: Secondary | ICD-10-CM | POA: Diagnosis present

## 2024-05-20 DIAGNOSIS — R6520 Severe sepsis without septic shock: Secondary | ICD-10-CM | POA: Diagnosis not present

## 2024-05-20 DIAGNOSIS — Z515 Encounter for palliative care: Secondary | ICD-10-CM | POA: Diagnosis not present

## 2024-05-20 DIAGNOSIS — T83511A Infection and inflammatory reaction due to indwelling urethral catheter, initial encounter: Secondary | ICD-10-CM | POA: Diagnosis present

## 2024-05-20 DIAGNOSIS — I959 Hypotension, unspecified: Secondary | ICD-10-CM | POA: Diagnosis present

## 2024-05-20 DIAGNOSIS — Z7189 Other specified counseling: Secondary | ICD-10-CM | POA: Diagnosis not present

## 2024-05-20 LAB — BASIC METABOLIC PANEL WITH GFR
Anion gap: 11 (ref 5–15)
BUN: 44 mg/dL — ABNORMAL HIGH (ref 8–23)
CO2: 27 mmol/L (ref 22–32)
Calcium: 8.7 mg/dL — ABNORMAL LOW (ref 8.9–10.3)
Chloride: 105 mmol/L (ref 98–111)
Creatinine, Ser: 0.83 mg/dL (ref 0.44–1.00)
GFR, Estimated: >60 mL/min (ref >60)
Glucose, Bld: 104 mg/dL — ABNORMAL HIGH (ref 70–99)
Potassium: 4.2 mmol/L (ref 3.5–5.1)
Sodium: 143 mmol/L (ref 135–145)

## 2024-05-20 LAB — CBC
HCT: 34.1 % — ABNORMAL LOW (ref 36.0–46.0)
Hemoglobin: 10.9 g/dL — ABNORMAL LOW (ref 12.0–15.0)
MCH: 34.8 pg — ABNORMAL HIGH (ref 26.0–34.0)
MCHC: 32 g/dL (ref 30.0–36.0)
MCV: 108.9 fL — ABNORMAL HIGH (ref 80.0–100.0)
Platelets: 157 K/uL (ref 150–400)
RBC: 3.13 MIL/uL — ABNORMAL LOW (ref 3.87–5.11)
RDW: 14.1 % (ref 11.5–15.5)
WBC: 11.1 K/uL — ABNORMAL HIGH (ref 4.0–10.5)
nRBC: 0.2 % (ref 0.0–0.2)

## 2024-05-20 LAB — LACTIC ACID, PLASMA: Lactic Acid, Venous: 1.9 mmol/L (ref 0.5–1.9)

## 2024-05-20 MED ORDER — ASPIRIN 81 MG PO CHEW
81.0000 mg | CHEWABLE_TABLET | Freq: Every day | ORAL | Status: DC
  Administered 2024-05-20 – 2024-05-30 (×11): 81 mg
  Filled 2024-05-20 (×11): qty 1

## 2024-05-20 MED ORDER — POLYETHYLENE GLYCOL 3350 17 G PO PACK: 34.0000 g | PACK | Freq: Every day | ORAL | Status: DC

## 2024-05-20 MED ORDER — VANCOMYCIN HCL 2000 MG/400ML IV SOLN
2000.0000 mg | Freq: Once | INTRAVENOUS | Status: AC
  Administered 2024-05-20: 2000 mg via INTRAVENOUS
  Filled 2024-05-20: qty 400

## 2024-05-20 MED ORDER — ACETAMINOPHEN 160 MG/5ML PO SOLN
650.0000 mg | Freq: Four times a day (QID) | ORAL | Status: DC | PRN
  Administered 2024-05-21 – 2024-05-31 (×17): 650 mg
  Filled 2024-05-20 (×18): qty 20.3

## 2024-05-20 MED ORDER — CEFTRIAXONE SODIUM 1 G IJ SOLR
1.0000 g | Freq: Once | INTRAMUSCULAR | Status: AC
  Administered 2024-05-20: 1 g via INTRAVENOUS
  Filled 2024-05-20: qty 10

## 2024-05-20 MED ORDER — ZINC OXIDE 40 % EX OINT
TOPICAL_OINTMENT | Freq: Two times a day (BID) | CUTANEOUS | Status: DC
Start: 2024-05-20 — End: 2024-05-31
  Administered 2024-05-21 – 2024-05-22 (×2): 1 via TOPICAL
  Filled 2024-05-20: qty 57

## 2024-05-20 MED ORDER — SODIUM CHLORIDE 0.9 % IV SOLN
2.0000 g | INTRAVENOUS | Status: DC
  Administered 2024-05-21: 2 g via INTRAVENOUS
  Filled 2024-05-20: qty 20

## 2024-05-20 MED ORDER — ACETAMINOPHEN 650 MG RE SUPP: 650.0000 mg | Freq: Four times a day (QID) | RECTAL | Status: DC | PRN

## 2024-05-20 MED ORDER — DIAZEPAM 2.5 MG RE GEL
2.5000 mg | RECTAL | Status: DC | PRN
  Filled 2024-05-20: qty 1

## 2024-05-20 MED ORDER — VANCOMYCIN HCL 1250 MG/250ML IV SOLN
1250.0000 mg | INTRAVENOUS | Status: DC
  Administered 2024-05-21: 1250 mg via INTRAVENOUS
  Filled 2024-05-20: qty 250

## 2024-05-20 MED ORDER — CHLORHEXIDINE GLUCONATE CLOTH 2 % EX PADS
6.0000 | MEDICATED_PAD | Freq: Every day | CUTANEOUS | Status: DC
  Administered 2024-05-21 – 2024-05-31 (×11): 6 via TOPICAL

## 2024-05-20 MED ORDER — SODIUM CHLORIDE 0.9 % IV SOLN: INTRAVENOUS | Status: AC

## 2024-05-20 MED ORDER — DIAZEPAM (20 MG DOSE) 2 X 10 MG/0.1ML NA LQPK: 20.0000 mg | Freq: Every day | NASAL | Status: DC | PRN

## 2024-05-20 NOTE — Consult Note (Signed)
 WOC Nurse Consult Note: Reason for Consult: Consult requested for G-tube site.  G-tube is leaking mod amt green drainage from the insertion site and this has caused full thickness skin loss related to moisture associated skin damage to the skin surrounding, 4X4X.2cm, red and painful.   Topical treatment will NOT be effective to promote healing as long as the tube is leaking. Please refer to interventional radiology fro further plan of care.   Goals are directed towards absorbing drainage and repelling moisture.   Dressing procedure/placement/frequency: Topical treatment orders provided for bedside nurses to perform as follows: Apply Desitin around G tube site BID and change split thickness gauze each time.  Please re-consult if further assistance is needed.  Thank-you,  Stephane Fought MSN, RN, CWOCN, CWCN-AP, CNS Contact Mon-Fri 0700-1500: [phone]

## 2024-05-20 NOTE — Plan of Care (Signed)

## 2024-05-20 NOTE — ED Notes (Signed)
 Patient has some what looks like dried excess skin in the mouth a decent size that may affect her air way if they break off. Provider aware. No new orders at this time.

## 2024-05-20 NOTE — Progress Notes (Signed)
 Pharmacy Antibiotic Note  Kathleen Jensen is a 73 y.o. female admitted on 05/19/2024 with secondary to infected jejunostomy tube/cellulitis.  Pharmacy has been consulted for vancomycin  dosing.  Plan: Vancomycin  2 gm IV x 1 then vancomycin  1250 mg IV q24 for eAUC 514.5 using SCr 1.1, Wt 107.3 kg from 04/06/24. VD 0.5. Css min 11.9 Ceftriaxone  1 gm given in ED 10/22 @ 1709 & 10/23 @ 07 am> will start Ceftriaxone  2 gm q24 on 10/24    Temp (24hrs), Avg:98.2 F (36.8 C), Min:97.6 F (36.4 C), Max:98.7 F (37.1 C)  Recent Labs  Lab 05/19/24 1442 05/19/24 1454 05/19/24 1455 05/19/24 1709 05/19/24 2152 05/20/24 0552  WBC 13.3*  --   --   --  9.9  --   CREATININE 0.91  --  1.10*  --   --   --   LATICACIDVEN  --  2.9*  --  2.8*  --  1.9    CrCl cannot be calculated (Unknown ideal weight.).    Allergies  Allergen Reactions   Tape Rash   Demerol [Meperidine Hcl] Nausea And Vomiting   Sulfonamide Derivatives Itching   Bacitracin-Polymyxin B Dermatitis    Not listed on the [facility]   Oxycontin  [Oxycodone ] Itching    Antimicrobials this admission: 10/22 Ceftriaxone  >>  10/23 vancomycin >> 10/22 cefadroxil x 1  Dose adjustments this admission:  Microbiology results: 10/22 BCx2: ngtd 10/22 UCx:  Thank you for allowing pharmacy to be a part of this patient's care.  Rosaline IVAR Edison, Pharm.D Use secure chat for questions 05/20/2024 6:45 AM

## 2024-05-20 NOTE — Progress Notes (Signed)
 ED TO INPATIENT HANDOFF REPORT  ED Nurse Name and Phone #: D Nakari Bracknell EMT-P   S Name/Age/Gender Kathleen Jensen 73 y.o. female Room/Bed: WA05/WA05  Code Status   Code Status: Full Code  Home/SNF/Other Nursing Home Patient oriented to: self Is this baseline? Yes   Triage Complete: Triage complete  Chief Complaint Elevated lactic acid level [R79.89] Sepsis (HCC) [A41.9]  Triage Note BIB EMS from [facility], leaking g tube, infected around site, (staff noticed today) CBG 162, 120/80-90-88% RA, 98 % on 6 liters. Answers questions by nodding head, decline in mental status.     Allergies Allergies  Allergen Reactions  . Tape Rash  . Demerol [Meperidine Hcl] Nausea And Vomiting  . Sulfonamide Derivatives Itching  . Bacitracin-Polymyxin B Dermatitis    Not listed on the [facility]  . Oxycontin  [Oxycodone ] Itching    Level of Care/Admitting Diagnosis ED Disposition    ED Disposition  Admit   Condition  --   Comment  Hospital [facility]: [facility] [[id number]]  Level of Care: Med-Surg [16]  May admit patient to [facility] or [facility] if equivalent level of care is available:: No  Diagnosis: Sepsis [facility]) [[id number]]  Admitting Physician: FELIZ ORTIZ, ABRAHAM [[id number]]  Attending Physician: FELIZ ORTIZ, ABRAHAM [[id number]]  Certification:: I certify this patient will need inpatient services for at least 2 midnights  Expected Medical Readiness: 05/26/2024         B Medical/Surgery History Past Medical History:  Diagnosis Date  . Aphasia   . Arthritis   . Asthma   . Cardiac pacemaker   . Cerebral amyloid angiopathy (CODE)   . CKD (chronic kidney disease)   . Cognitive communication deficit   . 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 GERD (gastroesophageal reflux disease)   . 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 Hyperlipemia   .  Hypertension   . Insomnia   . Intracerebral hemorrhage, intraventricular (HCC)   . Muscle weakness   . Other abnormalities of gait and mobility   . Seizures (HCC)   . Stroke (HCC)   . Unsteadiness on feet   . Vitamin B deficiency    Past Surgical History:  Procedure Laterality Date  . BACK SURGERY     Dec 18, 2016 Dr.Torrealba  . BREAST BIOPSY Right   . BREAST EXCISIONAL BIOPSY    . CERVICAL SPINE SURGERY     C4  . CESAREAN SECTION    . GASTRIC BYPASS  03/2007  . IR GJ TUBE CHANGE  11/16/2023  . IR REPLC DUODEN/JEJUNO TUBE PERCUT W/FLUORO  01/27/2023  . IR REPLC DUODEN/JEJUNO TUBE PERCUT W/FLUORO  03/07/2023  . IR REPLC DUODEN/JEJUNO TUBE PERCUT W/FLUORO  04/30/2023  . IR REPLC DUODEN/JEJUNO TUBE PERCUT W/FLUORO  09/02/2023  . IR REPLC DUODEN/JEJUNO TUBE PERCUT W/FLUORO  09/24/2023  . IR REPLC DUODEN/JEJUNO TUBE PERCUT W/FLUORO  04/02/2024  . TONSILLECTOMY    . TRANSESOPHAGEAL ECHOCARDIOGRAM (CATH LAB) N/A 04/01/2024   Procedure: TRANSESOPHAGEAL ECHOCARDIOGRAM;  Surgeon: Lonni Slain, MD;  Location: [facility] INVASIVE CV LAB;  Service: Cardiovascular;  Laterality: N/A;     A IV Location/Drains/Wounds Patient Lines/Drains/Airways Status    Active Line/Drains/Airways    Name Placement date Placement time Site Days   Peripheral IV 05/19/24 20 G 1.88 Left Antecubital 05/19/24  1607  Antecubital  1   Peripheral IV 05/19/24 22 G 1 Anterior;Left;Proximal;Upper Arm 05/19/24  2155  Arm  1   PICC Single Lumen 04/06/24 Right Basilic 40 cm 04/06/24  1224  Basilic  44   Gastrostomy/Enterostomy Jejunostomy 18 Fr. LUQ 04/02/24  0917  LUQ  48   Flatus Tube/Pouch 03/29/24  1625  --  52   Urethral Catheter Patty Straight-tip 14 Fr. 05/19/24  1341  Straight-tip  1   Airway 04/01/24  0942  -- 49   Wound 03/26/24 0725 Pressure Injury Coccyx Mid Deep Tissue Pressure Injury - Purple or maroon localized area of discolored intact skin or blood-filled blister due to damage of underlying soft  tissue from pressure and/or shear. 03/26/24  0725  Coccyx  55   Wound 05/20/24 Other (Comment) Abdomen 05/20/24  --  Abdomen  less than 1          Intake/Output Last 24 hours  Intake/Output Summary (Last 24 hours) at 05/20/2024 1020 Last data filed at 05/19/2024 1739 Gross per 24 hour  Intake 100 ml  Output --  Net 100 ml    Labs/Imaging Results for orders placed or performed during the hospital encounter of 05/19/24 (from the past 48 hours)  Urinalysis, w/ Reflex to Culture (Infection Suspected) -Urine, Catheterized; Indwelling urinary catheter     Status: Abnormal   Collection Time: 05/19/24  2:22 PM  Result Value Ref Range   Specimen Source URINE, CATHETERIZED    Color, Urine YELLOW YELLOW   APPearance HAZY (A) CLEAR   Specific Gravity, Urine 1.014 1.005 - 1.030   pH 6.0 5.0 - 8.0   Glucose, UA NEGATIVE NEGATIVE mg/dL   Hgb urine dipstick MODERATE (A) NEGATIVE   Bilirubin Urine NEGATIVE NEGATIVE   Ketones, ur NEGATIVE NEGATIVE mg/dL   Protein, ur 30 (A) NEGATIVE mg/dL   Nitrite NEGATIVE NEGATIVE   Leukocytes,Ua LARGE (A) NEGATIVE   RBC / HPF >50 0 - 5 RBC/hpf   WBC, UA >50 0 - 5 WBC/hpf    Comment:        Reflex urine culture not performed if WBC <=10, OR if Squamous epithelial cells >5. If Squamous epithelial cells >5 suggest recollection.    Bacteria, UA RARE (A) NONE SEEN   Squamous Epithelial / HPF 0-5 0 - 5 /HPF   WBC Clumps PRESENT    Mucus PRESENT    Hyaline Casts, UA PRESENT     Comment: Performed at [facility], [address], [place], [place] [address]  Comprehensive metabolic panel     Status: Abnormal   Collection Time: 05/19/24  2:42 PM  Result Value Ref Range   Sodium 142 135 - 145 mmol/L   Potassium 4.4 3.5 - 5.1 mmol/L   Chloride 106 98 - 111 mmol/L   CO2 26 22 - 32 mmol/L   Glucose, Bld 130 (H) 70 - 99 mg/dL    Comment: Glucose reference range applies only to samples taken after fasting for at least 8 hours.   BUN 51  (H) 8 - 23 mg/dL   Creatinine, Ser 9.08 0.44 - 1.00 mg/dL   Calcium  8.5 (L) 8.9 - 10.3 mg/dL   Total Protein 9.0 (H) 6.5 - 8.1 g/dL   Albumin  2.1 (L) 3.5 - 5.0 g/dL   AST 57 (H) 15 - 41 U/L    Comment: HEMOLYSIS AT THIS LEVEL MAY AFFECT RESULT   ALT 12 0 - 44 U/L   Alkaline Phosphatase 214 (H) 38 - 126 U/L   Total Bilirubin 1.0 0.0 - 1.2 mg/dL   GFR, Estimated >39 >39 mL/min  Comment: (NOTE) Calculated using the CKD-EPI Creatinine Equation (2021)    Anion gap 9 5 - 15    Comment: Performed at [facility], [address], [place], [place] [address]  CBC with Differential     Status: Abnormal   Collection Time: 05/19/24  2:42 PM  Result Value Ref Range   WBC 13.3 (H) 4.0 - 10.5 K/uL   RBC 1.94 (L) 3.87 - 5.11 MIL/uL   Hemoglobin 6.6 (LL) 12.0 - 15.0 g/dL    Comment: REPEATED TO VERIFY This critical result has been called to GEORGINA, A RN by Darice Freddi Hong on 05/19/2024 15:17:47, and has been read back.    HCT 21.7 (L) 36.0 - 46.0 %   MCV 111.9 (H) 80.0 - 100.0 fL   MCH 34.0 26.0 - 34.0 pg   MCHC 30.4 30.0 - 36.0 g/dL   RDW 85.9 88.4 - 84.4 %   Platelets 171 150 - 400 K/uL   nRBC 0.0 0.0 - 0.2 %   Neutrophils Relative % 49 %   Neutro Abs 6.6 1.7 - 7.7 K/uL   Lymphocytes Relative 34 %   Lymphs Abs 4.5 (H) 0.7 - 4.0 K/uL   Monocytes Relative 15 %   Monocytes Absolute 2.0 (H) 0.1 - 1.0 K/uL   Eosinophils Relative 1 %   Eosinophils Absolute 0.1 0.0 - 0.5 K/uL   Basophils Relative 0 %   Basophils Absolute 0.0 0.0 - 0.1 K/uL   WBC Morphology MORPHOLOGY UNREMARKABLE    RBC Morphology MORPHOLOGY UNREMARKABLE    Smear Review Normal platelet morphology    Immature Granulocytes 1 %   Abs Immature Granulocytes 0.07 0.00 - 0.07 K/uL    Comment: Performed at [facility], [address], [place], [place] [address]  Protime-INR     Status: Abnormal   Collection Time: 05/19/24  2:42 PM  Result Value Ref Range   Prothrombin Time 19.0 (H)  11.4 - 15.2 seconds   INR 1.5 (H) 0.8 - 1.2    Comment: (NOTE) INR goal varies based on device and disease states. Performed at [facility], [address] [place], [place] [address]   Blood Culture (routine x 2)     Status: None (Preliminary result)   Collection Time: 05/19/24  2:42 PM   Specimen: BLOOD  Result Value Ref Range   Specimen Description      BLOOD BLOOD LEFT ARM Performed at [facility], [address] [place], [place] [address]    Special Requests      BOTTLES DRAWN AEROBIC ONLY Blood Culture results may not be optimal due to an inadequate volume of blood received in culture bottles Performed at [facility], [address] [place], [place] [address]    Culture      NO GROWTH < 12 HOURS Performed at [facility], [address], [place], [place] [address]    Report Status PENDING   I-Stat Lactic Acid, ED     Status: Abnormal   Collection Time: 05/19/24  2:54 PM  Result Value Ref Range   Lactic Acid, Venous 2.9 (HH) 0.5 - 1.9 mmol/L   Comment NOTIFIED PHYSICIAN   I-stat chem 8, ED (not at [facility], [facility] or [facility])     Status: Abnormal   Collection Time: 05/19/24  2:55 PM  Result Value Ref Range   Sodium 145 135 - 145 mmol/L   Potassium 4.3 3.5 - 5.1 mmol/L   Chloride  105 98 - 111 mmol/L   BUN 49 (H) 8 - 23 mg/dL   Creatinine, Ser 8.89 (H) 0.44 - 1.00 mg/dL   Glucose, Bld 867 (H) 70 - 99 mg/dL    Comment: Glucose reference range applies only to samples taken after fasting for at least 8 hours.   Calcium , Ion 1.11 (L) 1.15 - 1.40 mmol/L   TCO2 28 22 - 32 mmol/L   Hemoglobin 12.9 12.0 - 15.0 g/dL   HCT 61.9 63.9 - 53.9 %  POC occult blood, ED     Status: None   Collection Time: 05/19/24  3:52 PM  Result Value Ref Range   Fecal Occult Bld NEGATIVE NEGATIVE  Prepare RBC (crossmatch)     Status: None   Collection Time: 05/19/24  4:00 PM  Result Value Ref Range   Order Confirmation      ORDER  PROCESSED BY BLOOD BANK Performed at [facility], [address], [place], [place] [address]   Blood Culture (routine x 2)     Status: None (Preliminary result)   Collection Time: 05/19/24  4:15 PM   Specimen: BLOOD  Result Value Ref Range   Specimen Description      BLOOD LEFT ANTECUBITAL Performed at [facility], [address], [place], [place] [address]    Special Requests      BOTTLES DRAWN AEROBIC AND ANAEROBIC Blood Culture adequate volume Performed at [facility], [address], [place], [place] [address]    Culture      NO GROWTH < 12 HOURS Performed at [facility], [address], [place], [place] [address]    Report Status PENDING   Type and screen [facility]     Status: None (Preliminary result)   Collection Time: 05/19/24  5:00 PM  Result Value Ref Range   ABO/RH(D) B POS    Antibody Screen NEG    Sample Expiration 05/22/2024,2359    Unit Number [id number]    Blood Component Type RED CELLS,LR    Unit division 00    Status of Unit ALLOCATED    Transfusion Status OK TO TRANSFUSE    Crossmatch Result      Compatible Performed at [facility], [address], [place], [place] [address]    Unit Number [id number]    Blood Component Type RED CELLS,LR    Unit division 00    Status of Unit ALLOCATED    Transfusion Status OK TO TRANSFUSE    Crossmatch Result Compatible   I-Stat Lactic Acid, ED     Status: Abnormal   Collection Time: 05/19/24  5:09 PM  Result Value Ref Range   Lactic Acid, Venous 2.8 (HH) 0.5 - 1.9 mmol/L   Comment NOTIFIED PHYSICIAN   ABO/Rh     Status: None   Collection Time: 05/19/24  6:25 PM  Result Value Ref Range   ABO/RH(D)      B POS Performed at [facility], [address], [place], [place] [address]   CBC     Status: Abnormal   Collection Time: 05/19/24  9:52 PM  Result Value Ref Range   WBC 9.9 4.0 -  10.5 K/uL   RBC 3.35 (L) 3.87 - 5.11 MIL/uL   Hemoglobin 11.2 (L) 12.0 - 15.0 g/dL    Comment: REPEATED TO VERIFY RESULTS VERIFIED VIA RECOLLECT PREVIOUS RESULTS QUESTIONABLE, RN NOTIFIED    HCT 36.1 36.0 - 46.0 %  MCV 107.8 (H) 80.0 - 100.0 fL   MCH 33.4 26.0 - 34.0 pg   MCHC 31.0 30.0 - 36.0 g/dL   RDW 86.2 88.4 - 84.4 %   Platelets 140 (L) 150 - 400 K/uL    Comment: REPEATED TO VERIFY   nRBC 0.0 0.0 - 0.2 %    Comment: Performed at [facility], [address], [place], [place] [address]  CBC     Status: Abnormal   Collection Time: 05/20/24  5:51 AM  Result Value Ref Range   WBC 11.1 (H) 4.0 - 10.5 K/uL   RBC 3.13 (L) 3.87 - 5.11 MIL/uL   Hemoglobin 10.9 (L) 12.0 - 15.0 g/dL   HCT 65.8 (L) 63.9 - 53.9 %   MCV 108.9 (H) 80.0 - 100.0 fL   MCH 34.8 (H) 26.0 - 34.0 pg   MCHC 32.0 30.0 - 36.0 g/dL   RDW 85.8 88.4 - 84.4 %   Platelets 157 150 - 400 K/uL   nRBC 0.2 0.0 - 0.2 %    Comment: Performed at [facility], [address], [place], [place] [address]  Lactic acid, plasma     Status: None   Collection Time: 05/20/24  5:52 AM  Result Value Ref Range   Lactic Acid, Venous 1.9 0.5 - 1.9 mmol/L    Comment: Performed at [facility], [address] [place], [place] [address]   CT ABDOMEN PELVIS W CONTRAST Result Date: 05/19/2024 CLINICAL DATA:  Leaking gastrostomy tube, infection around G-tube site, abdominal pain EXAM: CT ABDOMEN AND PELVIS WITH CONTRAST TECHNIQUE: Multidetector CT imaging of the abdomen and pelvis was performed using the standard protocol following bolus administration of intravenous contrast. RADIATION DOSE REDUCTION: This exam was performed according to the departmental dose-optimization program which includes automated exposure control, adjustment of the mA and/or kV according to patient size and/or use of iterative reconstruction technique. CONTRAST:  OMNIPAQUE  IOHEXOL  300 MG/ML  SOLN  COMPARISON:  03/24/2024 FINDINGS: Lower chest: No acute pleural or parenchymal lung disease. Dual lead cardiac pacer. Hepatobiliary: No focal liver abnormality is seen. Status post cholecystectomy. No biliary dilatation. Pancreas: Unremarkable. No pancreatic ductal dilatation or surrounding inflammatory changes. Spleen: Normal in size without focal abnormality. Adrenals/Urinary Tract: There is a 4.3 x 4.0 x 4.8 cm complex cyst upper pole left kidney, with peripheral 1.8 x 1.2 x 1.3 cm nodularity. The peripheral nodularity is increased in prominence since prior exams, and further evaluation with nonemergent outpatient dedicated renal CT is recommended if the patient would be a therapy candidate should neoplasm be detected. Otherwise the kidneys are unremarkable. No urinary tract calculi or obstructive uropathy. The adrenals appear normal. The bladder is decompressed with a Foley catheter in place. Stomach/Bowel: No bowel obstruction or ileus. There is chronic gaseous distension of the colon likely reflecting chronic adynamic colonic ileus. Multiple clips within the cecum. Normal appendix. Postsurgical changes from bariatric surgery. Stable position of the percutaneous change in ostomy tube, tip within the mid jejunum. No acute findings within the abdominal wall surrounding the jejunostomy catheter site. Vascular/Lymphatic: Aortic atherosclerosis. No enlarged abdominal or pelvic lymph nodes. Reproductive: Stable fibroid uterus.  No adnexal masses. Other: No free fluid or free intraperitoneal gas. No abdominal wall hernia. Musculoskeletal: No acute or destructive bony abnormalities. Stable postsurgical changes within the lower lumbar spine. Reconstructed images demonstrate no additional findings. IMPRESSION: 1. Stable percutaneous jejunostomy tube, without evidence of complication. 2. Chronic gaseous distention of the colon, likely representing  chronic adynamic colonic ileus. 3. Complex upper pole left renal cyst, with  peripheral nodularity. Further evaluation with nonemergent outpatient dedicated renal CT is recommended if the patient would be a therapy candidate should neoplasm be detected. 4.  Aortic Atherosclerosis (ICD10-I70.0). Electronically Signed   By: Ozell Daring M.D.   On: 05/19/2024 16:59   CT Head Wo Contrast Result Date: 05/19/2024 EXAM: CT HEAD WITHOUT CONTRAST 05/19/2024 04:46:55 PM TECHNIQUE: CT of the head was performed without the administration of intravenous contrast. Automated exposure control, iterative reconstruction, and/or weight based adjustment of the mA/kV was utilized to reduce the radiation dose to as low as reasonably achievable. COMPARISON: Comparison with 07/14/2023. CLINICAL HISTORY: Mental status change, unknown cause. Leaking G-tube, infected around site (staff noticed today). CBG 162. Blood pressure 120/80-90. Oxygen saturation 88% on room air, 98% on 6 liters. Answers questions by nodding head. Decline in mental status. FINDINGS: BRAIN AND VENTRICLES: No acute hemorrhage. No evidence of acute infarct. Chronic microvascular ischemia and generalized atrophy. Chronic left MCA territory infarct. Chronic right thalamic infarct. Chronic left cerebellar infarcts. No hydrocephalus. No extra-axial collection. No mass effect or midline shift. ORBITS: No acute abnormality. SINUSES: No acute abnormality. SOFT TISSUES AND SKULL: No acute soft tissue abnormality. No skull fracture. IMPRESSION: 1. No acute intracranial abnormality. Electronically signed by: Norman Gatlin MD 05/19/2024 04:54 PM EDT RP Workstation: [id number]   DG Chest Port 1 View Result Date: 05/19/2024 EXAM: 1 VIEW(S) XRAY OF THE CHEST 05/19/2024 01:56:39 PM COMPARISON: 06/29/2023 CLINICAL HISTORY: Questionable sepsis - evaluate for abnormality. FINDINGS: LUNGS AND PLEURA: Low lung volumes. Elevated right hemidiaphragm, unchanged. No focal pulmonary opacity. No pulmonary edema. No pleural effusion. No pneumothorax. HEART AND  MEDIASTINUM: Aortic atherosclerosis. Left pacemaker in place. No acute abnormality of the cardiac and mediastinal silhouettes. BONES AND SOFT TISSUES: No acute osseous abnormality. IMPRESSION: 1. No acute cardiopulmonary process identified. Electronically signed by: Waddell Calk MD 05/19/2024 02:25 PM EDT RP Workstation: [id number]   Pending Labs Unresulted Labs (From admission, onward)    Start     Ordered   05/21/24 0500  Basic metabolic panel  Daily,   R      05/20/24 0624   05/20/24 0705  Basic metabolic panel with GFR  Once,   R        05/20/24 0705   05/19/24 1422  Urine Culture  Once,   R        05/19/24 1422          Vitals/Pain Today's Vitals   05/20/24 0500 05/20/24 0600 05/20/24 0700 05/20/24 0745  BP:   110/70   Pulse: 76 73 76   Resp: (!) 24 (!) 24 (!) 21   Temp:    98.4 F (36.9 C)  TempSrc:    Axillary  SpO2: 92% 95% 95%     Isolation Precautions No active isolations  Medications Medications  0.9 %  sodium chloride  infusion (Manually program via Guardrails IV Fluids) (has no administration in time range)  levETIRAcetam  (KEPPRA ) 100 MG/ML solution 1,000 mg (1,000 mg Per Tube Given 05/19/24 2341)  lactated ringers  bolus 500 mL (500 mLs Intravenous Not Given 05/20/24 0920)  sodium chloride  flush (NS) 0.9 % injection 3 mL (3 mLs Intravenous Not Given 05/20/24 0657)  acetaminophen  (TYLENOL ) tablet 650 mg (has no administration in time range)    Or  acetaminophen  (TYLENOL ) suppository 650 mg (has no administration in time range)  ondansetron  (ZOFRAN ) tablet 4 mg (has no administration in time range)  Or  ondansetron  (ZOFRAN ) injection 4 mg (has no administration in time range)  albuterol  (PROVENTIL ) (2.5 MG/3ML) 0.083% nebulizer solution 2.5 mg (has no administration in time range)  amiodarone  (PACERONE ) tablet 200 mg (has no administration in time range)  metoprolol  tartrate (LOPRESSOR ) tablet 25 mg (25 mg Per Tube Given 05/19/24 2348)  simvastatin  (ZOCOR )  tablet 10 mg (10 mg Per Tube Not Given 05/19/24 2315)  famotidine  (PEPCID ) 40 MG/5ML suspension 40 mg (has no administration in time range)  simethicone  (MYLICON) chewable tablet 80 mg (80 mg Per Tube Given 05/19/24 2348)  folic acid  (FOLVITE ) tablet 1 mg (has no administration in time range)  cyclobenzaprine  (FLEXERIL ) tablet 2.5 mg (2.5 mg Per Tube Given 05/19/24 2348)  multivitamin with minerals tablet 1 tablet (has no administration in time range)  valproic  acid (DEPAKENE ) 250 MG/5ML solution 500 mg (has no administration in time range)    And  valproic  acid (DEPAKENE ) 250 MG/5ML solution 750 mg (750 mg Per Tube Given 05/19/24 2342)  docusate (COLACE) 50 MG/5ML liquid 100 mg (100 mg Per Tube Not Given 05/19/24 2315)  aspirin  chewable tablet 81 mg (has no administration in time range)  diazepam  (DIASTAT ) rectal kit 2.5 mg (has no administration in time range)  0.9 %  sodium chloride  infusion (has no administration in time range)  cefTRIAXone  (ROCEPHIN ) 2 g in sodium chloride  0.9 % 100 mL IVPB (has no administration in time range)  vancomycin  (VANCOREADY) IVPB 1250 mg/250 mL (has no administration in time range)  liver oil-zinc  oxide (DESITIN) 40 % ointment (has no administration in time range)  lactated ringers  bolus 1,000 mL (0 mLs Intravenous Stopped 05/19/24 1900)  cefTRIAXone  (ROCEPHIN ) 1 g in sodium chloride  0.9 % 100 mL IVPB (0 g Intravenous Stopped 05/19/24 1739)  pantoprazole  (PROTONIX ) injection 40 mg (40 mg Intravenous Given 05/19/24 1709)  iohexol  (OMNIPAQUE ) 300 MG/ML solution 100 mL (100 mLs Intravenous Contrast Given 05/19/24 1632)  cefTRIAXone  (ROCEPHIN ) 1 g in sodium chloride  0.9 % 100 mL IVPB (0 g Intravenous Stopped 05/20/24 0724)  vancomycin  (VANCOREADY) IVPB 2000 mg/400 mL (0 mg Intravenous Stopped 05/20/24 9078)    Mobility non-ambulatory     Focused Assessments n/a   R Recommendations: See Admitting Provider Note  Report given to:   Additional Notes:  n/a

## 2024-05-20 NOTE — ED Notes (Signed)
 1500 cc's of urine removed from foley bag at this time.

## 2024-05-20 NOTE — Progress Notes (Signed)
 [facility] PROGRESS NOTE    Progress Note  Kathleen Jensen  FMW:[id number] DOB: 09/02/1950 DOA: 05/19/2024 PCP: System, Provider Not In     Brief Narrative:   Kathleen Jensen is an 73 y.o. female nursing home resident with a history of CVA, bedbound with a chronic PEG, indwelling Foley catheter seizure disorder advanced dementia, recently discharged 2 months ago for endocarditis, felt to be not a surgical candidate so she was committed to lifelong suppressive antibiotic treatment PICC line was placed on 04/06/2024, now on oral cefadroxil since 05/07/2024.  Was sent from nursing home because of PEG tube leakage, CT scan of the abdomen pelvis showed jejunostomy tube in place without any obvious complication, adynamic ileus, was found to have a lactic acid of 2.8 hemoglobin is 6.3, repeated 12, no leukocytosis  Assessment/Plan:   Sepsis secondary to infected jejunostomy tube/cellulitis: CT scan of the abdomen pelvis is unremarkable. Wound care has been consulted. Purulent drainage, her gauze is soaked in pus.  She was started on IV Rocephin  she has defervesced leukocytosis improved. Continue IV Rocephin , will go ahead and add vancomycin . Urine showed some mild hematuria nitrates negative in the setting of a chronic indwelling Foley catheter. Blood cultures and urine cultures were sent. It is to note that at home she is on antibiotic suppressive therapy for endocarditis. Started on IV fluids and lactic acid is trending down slowly.  Acute kidney injury: Likely prerenal azotemia, hold diuretic therapy. With a baseline creatinine of 0.7. On admission 1.1, started on IV fluids recheck a basic metabolic panel in the morning.  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 Continue lifelong suppressive antibiotic therapy  CVA (cerebral vascular accident) (HCC) Continue aspirin .  History of seizure disorder: Continue Keppra  and valproic  acid.  Paroxysmal atrial  fibrillation: Rate controlled continue amiodarone  and metoprolol .  Essential hypertension: Hold losartan .  In the setting of sepsis and acute kidney injury.  GERD: Continue famotidine .   Sacral decubitus ulcer stage IV present on admission: Consult wound care. RN Pressure Injury Documentation: Wound 03/26/24 0725 Pressure Injury Coccyx Mid Deep Tissue Pressure Injury - Purple or maroon localized area of discolored intact skin or blood-filled blister due to damage of underlying soft tissue from pressure and/or shear. (Active)    Estimated body mass index is 33.94 kg/m as calculated from the following:   Height as of 03/25/24: 5' 10 (1.778 m).   Weight as of 04/06/24: 107.3 kg.  DVT prophylaxis: lovenox  Family Communication:none Status is: Observation The patient will require care spanning > 2 midnights and should be moved to inpatient because: Sepsis    Code Status:     Code Status Orders  (From admission, onward)           Start     Ordered   05/19/24 2255  Full code  Continuous       Question:  By:  Answer:  Consent: discussion documented in EHR   05/19/24 2256           Code Status History     Date Active Date Inactive Code Status Order ID Comments User Context   03/25/2024 1310 04/06/2024 2128 Full Code [id number]  Howell Lunger, DO ED   06/06/2023 1800 06/10/2023 0439 Full Code [id number]  Lou Claretta HERO, MD ED   03/01/2023 0229 03/06/2023 1747 Full Code [id number]  Lee Kingfisher, MD ED   11/16/2022 2216 12/03/2022 1824 DNR [id number]  Fredirick Glenys RAMAN, MD ED   11/16/2022 2042 11/16/2022 2216 DNR [id number]  Yolande Lamar BROCKS, MD ED   11/16/2022 1325 11/16/2022 2042 Full Code [id number]  Yolande Lamar BROCKS, MD ED   05/15/2022 1946 05/18/2022 2321 Full Code [id number]  Howell Lunger, DO ED   02/15/2022 1947 02/22/2022 1859 Full Code [id number]  Shelah Lamar RAMAN, MD ED   05/29/2021 1500 06/07/2021 0431 Full Code [id number]  Waddell Rake, MD ED   04/12/2020 0114 04/14/2020  1847 Full Code [id number]  Lonzell Emeline HERO, DO ED   10/05/2017 1544 10/15/2017 1619 Full Code [id number]  Netta Darice HERO, NP ED         IV Access:   Peripheral IV   Procedures and diagnostic studies:   CT ABDOMEN PELVIS W CONTRAST Result Date: 05/19/2024 CLINICAL DATA:  Leaking gastrostomy tube, infection around G-tube site, abdominal pain EXAM: CT ABDOMEN AND PELVIS WITH CONTRAST TECHNIQUE: Multidetector CT imaging of the abdomen and pelvis was performed using the standard protocol following bolus administration of intravenous contrast. RADIATION DOSE REDUCTION: This exam was performed according to the departmental dose-optimization program which includes automated exposure control, adjustment of the mA and/or kV according to patient size and/or use of iterative reconstruction technique. CONTRAST:  OMNIPAQUE  IOHEXOL  300 MG/ML  SOLN COMPARISON:  03/24/2024 FINDINGS: Lower chest: No acute pleural or parenchymal lung disease. Dual lead cardiac pacer. Hepatobiliary: No focal liver abnormality is seen. Status post cholecystectomy. No biliary dilatation. Pancreas: Unremarkable. No pancreatic ductal dilatation or surrounding inflammatory changes. Spleen: Normal in size without focal abnormality. Adrenals/Urinary Tract: There is a 4.3 x 4.0 x 4.8 cm complex cyst upper pole left kidney, with peripheral 1.8 x 1.2 x 1.3 cm nodularity. The peripheral nodularity is increased in prominence since prior exams, and further evaluation with nonemergent outpatient dedicated renal CT is recommended if the patient would be a therapy candidate should neoplasm be detected. Otherwise the kidneys are unremarkable. No urinary tract calculi or obstructive uropathy. The adrenals appear normal. The bladder is decompressed with a Foley catheter in place. Stomach/Bowel: No bowel obstruction or ileus. There is chronic gaseous distension of the colon likely reflecting chronic adynamic colonic ileus. Multiple clips within the  cecum. Normal appendix. Postsurgical changes from bariatric surgery. Stable position of the percutaneous change in ostomy tube, tip within the mid jejunum. No acute findings within the abdominal wall surrounding the jejunostomy catheter site. Vascular/Lymphatic: Aortic atherosclerosis. No enlarged abdominal or pelvic lymph nodes. Reproductive: Stable fibroid uterus.  No adnexal masses. Other: No free fluid or free intraperitoneal gas. No abdominal wall hernia. Musculoskeletal: No acute or destructive bony abnormalities. Stable postsurgical changes within the lower lumbar spine. Reconstructed images demonstrate no additional findings. IMPRESSION: 1. Stable percutaneous jejunostomy tube, without evidence of complication. 2. Chronic gaseous distention of the colon, likely representing chronic adynamic colonic ileus. 3. Complex upper pole left renal cyst, with peripheral nodularity. Further evaluation with nonemergent outpatient dedicated renal CT is recommended if the patient would be a therapy candidate should neoplasm be detected. 4.  Aortic Atherosclerosis (ICD10-I70.0). Electronically Signed   By: Ozell Daring M.D.   On: 05/19/2024 16:59   CT Head Wo Contrast Result Date: 05/19/2024 EXAM: CT HEAD WITHOUT CONTRAST 05/19/2024 04:46:55 PM TECHNIQUE: CT of the head was performed without the administration of intravenous contrast. Automated exposure control, iterative reconstruction, and/or weight based adjustment of the mA/kV was utilized to reduce the radiation dose to as low as reasonably achievable. COMPARISON: Comparison with 07/14/2023. CLINICAL HISTORY: Mental status change, unknown cause. Leaking G-tube, infected around site (staff noticed today). CBG 162. Blood  pressure 120/80-90. Oxygen saturation 88% on room air, 98% on 6 liters. Answers questions by nodding head. Decline in mental status. FINDINGS: BRAIN AND VENTRICLES: No acute hemorrhage. No evidence of acute infarct. Chronic microvascular ischemia  and generalized atrophy. Chronic left MCA territory infarct. Chronic right thalamic infarct. Chronic left cerebellar infarcts. No hydrocephalus. No extra-axial collection. No mass effect or midline shift. ORBITS: No acute abnormality. SINUSES: No acute abnormality. SOFT TISSUES AND SKULL: No acute soft tissue abnormality. No skull fracture. IMPRESSION: 1. No acute intracranial abnormality. Electronically signed by: Norman Gatlin MD 05/19/2024 04:54 PM EDT RP Workstation: [id number]   DG Chest Port 1 View Result Date: 05/19/2024 EXAM: 1 VIEW(S) XRAY OF THE CHEST 05/19/2024 01:56:39 PM COMPARISON: 06/29/2023 CLINICAL HISTORY: Questionable sepsis - evaluate for abnormality. FINDINGS: LUNGS AND PLEURA: Low lung volumes. Elevated right hemidiaphragm, unchanged. No focal pulmonary opacity. No pulmonary edema. No pleural effusion. No pneumothorax. HEART AND MEDIASTINUM: Aortic atherosclerosis. Left pacemaker in place. No acute abnormality of the cardiac and mediastinal silhouettes. BONES AND SOFT TISSUES: No acute osseous abnormality. IMPRESSION: 1. No acute cardiopulmonary process identified. Electronically signed by: Waddell Calk MD 05/19/2024 02:25 PM EDT RP Workstation: [id number]     Medical Consultants:   None.   Subjective:    Kathleen Jensen nonverbal  Objective:    Vitals:   05/19/24 2051 05/19/24 2130 05/19/24 2200 05/19/24 2230  BP:      Pulse:  76 74   Resp:  18 19 19   Temp: 98.7 F (37.1 C)     TempSrc: Rectal     SpO2:  97% 97%    SpO2: 97 %   Intake/Output Summary (Last 24 hours) at 05/20/2024 0603 Last data filed at 05/19/2024 1739 Gross per 24 hour  Intake 100 ml  Output --  Net 100 ml   There were no vitals filed for this visit.  Exam: General exam: In no acute distress, crusted dry mucous membrane Respiratory system: Good air movement and clear to auscultation. Cardiovascular system: S1 & S2 heard, RRR. No JVD. Gastrointestinal system: Abdomen is  nondistended, soft and nontender.  Extremities: No pedal edema. Skin: Her jejunostomy tube site looks macerated angry and erythematous, the gauze was soaked in purulent drainage. Psychiatry: No judgment or insight of medical condition.   Data Reviewed:    Labs: Basic Metabolic Panel: Recent Labs  Lab 05/19/24 1442 05/19/24 1455  NA 142 145  K 4.4 4.3  CL 106 105  CO2 26  --   GLUCOSE 130* 132*  BUN 51* 49*  CREATININE 0.91 1.10*  CALCIUM  8.5*  --    GFR CrCl cannot be calculated (Unknown ideal weight.). Liver Function Tests: Recent Labs  Lab 05/19/24 1442  AST 57*  ALT 12  ALKPHOS 214*  BILITOT 1.0  PROT 9.0*  ALBUMIN  2.1*   No results for input(s): LIPASE, AMYLASE in the last 168 hours. No results for input(s): AMMONIA in the last 168 hours. Coagulation profile Recent Labs  Lab 05/19/24 1442  INR 1.5*   COVID-19 Labs  No results for input(s): DDIMER, FERRITIN, LDH, CRP in the last 72 hours.  Lab Results  Component Value Date   SARSCOV2NAA NEGATIVE 11/16/2022   SARSCOV2NAA NEGATIVE 02/15/2022   SARSCOV2NAA NEGATIVE 04/11/2020    CBC: Recent Labs  Lab 05/19/24 1442 05/19/24 1455 05/19/24 2152  WBC 13.3*  --  9.9  NEUTROABS 6.6  --   --   HGB 6.6* 12.9 11.2*  HCT 21.7* 38.0 36.1  MCV 111.9*  --  107.8*  PLT 171  --  140*   Cardiac Enzymes: No results for input(s): CKTOTAL, CKMB, CKMBINDEX, TROPONINI in the last 168 hours. BNP (last 3 results) No results for input(s): PROBNP in the last 8760 hours. CBG: No results for input(s): GLUCAP in the last 168 hours. D-Dimer: No results for input(s): DDIMER in the last 72 hours. Hgb A1c: No results for input(s): HGBA1C in the last 72 hours. Lipid Profile: No results for input(s): CHOL, HDL, LDLCALC, TRIG, CHOLHDL, LDLDIRECT in the last 72 hours. Thyroid  function studies: No results for input(s): TSH, T4TOTAL, T3FREE, THYROIDAB in the last 72  hours.  Invalid input(s): FREET3 Anemia work up: No results for input(s): VITAMINB12, FOLATE, FERRITIN, TIBC, IRON, RETICCTPCT in the last 72 hours. Sepsis Labs: Recent Labs  Lab 05/19/24 1442 05/19/24 1454 05/19/24 1709 05/19/24 2152  WBC 13.3*  --   --  9.9  LATICACIDVEN  --  2.9* 2.8*  --    Microbiology No results found for this or any previous visit (from the past 240 hours).   Medications:    sodium chloride    Intravenous Once   amiodarone   200 mg Per Tube Daily   ascorbic acid   250 mg Per Tube BID   cefadroxil  1,000 mg Per Tube Q12H   cyclobenzaprine   2.5 mg Per Tube Q12H   docusate  100 mg Per Tube BID   famotidine   40 mg Per Tube Daily   folic acid   1 mg Per Tube Daily   lactulose   30 g Per Tube Daily   levETIRAcetam   1,000 mg Per Tube BID   losartan   25 mg Per Tube Daily   metoprolol  tartrate  25 mg Per Tube BID   multivitamin with minerals  1 tablet Per Tube Daily   simethicone   80 mg Per Tube TID   simvastatin   10 mg Per Tube QHS   sodium chloride  flush  3 mL Intravenous Q12H   torsemide   10 mg Per Tube Q8H   valproic  acid  500 mg Per Tube TID with meals   And   valproic  acid  750 mg Per Tube QHS   Continuous Infusions:  lactated ringers         LOS: 0 days   Erle Odell Castor  [facility]  05/20/2024, 6:03 AM

## 2024-05-20 NOTE — Progress Notes (Signed)
 [name], female   DOB: Dec 23, 1950, 73 y.o.   MRN: [id number] Asked to evaluate patient's G-tube secondary to possible infection at insertion site.  On exam today there is some friability around G-tube insertion site along with some induration/ scarring but no true infection.  Multiple gauze pads are noted to be underneath disc which is not recommended.  Gauze were removed and only 1 split gauze was placed under disc.  Desitin was applied to skin surface and disc was made taut to skin surface; tape was applied to tube above this to prevent slippage; latest CT scan was reviewed by Dr. Luverne and this shows no abscess or fluid collection at G-tube insertion site. Ok to use tube at this time.  Recommend use of only 1 layer of drain sponge gauze under disc at all times.  Recommend keeping disc taut to skin surface to prevent leakage of gastric contents onto skin surface.  If problems continue please call [phone] for further evaluation.

## 2024-05-21 DIAGNOSIS — Z7401 Bed confinement status: Secondary | ICD-10-CM

## 2024-05-21 DIAGNOSIS — N3000 Acute cystitis without hematuria: Secondary | ICD-10-CM | POA: Diagnosis not present

## 2024-05-21 DIAGNOSIS — Z515 Encounter for palliative care: Secondary | ICD-10-CM

## 2024-05-21 DIAGNOSIS — R7989 Other specified abnormal findings of blood chemistry: Secondary | ICD-10-CM | POA: Diagnosis not present

## 2024-05-21 DIAGNOSIS — K9420 Gastrostomy complication, unspecified: Secondary | ICD-10-CM | POA: Diagnosis not present

## 2024-05-21 DIAGNOSIS — A419 Sepsis, unspecified organism: Secondary | ICD-10-CM | POA: Diagnosis not present

## 2024-05-21 DIAGNOSIS — Z7189 Other specified counseling: Secondary | ICD-10-CM | POA: Diagnosis not present

## 2024-05-21 DIAGNOSIS — R4589 Other symptoms and signs involving emotional state: Secondary | ICD-10-CM | POA: Diagnosis not present

## 2024-05-21 LAB — BASIC METABOLIC PANEL WITH GFR
Anion gap: 7 (ref 5–15)
BUN: 38 mg/dL — ABNORMAL HIGH (ref 8–23)
CO2: 28 mmol/L (ref 22–32)
Calcium: 8.1 mg/dL — ABNORMAL LOW (ref 8.9–10.3)
Chloride: 111 mmol/L (ref 98–111)
Creatinine, Ser: 0.72 mg/dL (ref 0.44–1.00)
GFR, Estimated: >60 mL/min (ref >60)
Glucose, Bld: 107 mg/dL — ABNORMAL HIGH (ref 70–99)
Potassium: 3.8 mmol/L (ref 3.5–5.1)
Sodium: 146 mmol/L — ABNORMAL HIGH (ref 135–145)

## 2024-05-21 LAB — URINE CULTURE: Culture: >=100000 — AB

## 2024-05-21 MED ORDER — VANCOMYCIN HCL 1500 MG/300ML IV SOLN
1500.0000 mg | INTRAVENOUS | Status: DC
Start: 2024-05-22 — End: 2024-05-21

## 2024-05-21 MED ORDER — ENOXAPARIN SODIUM 60 MG/0.6ML IJ SOSY
45.0000 mg | PREFILLED_SYRINGE | INTRAMUSCULAR | Status: DC
  Administered 2024-05-21: 45 mg via SUBCUTANEOUS
  Filled 2024-05-21: qty 0.6

## 2024-05-21 MED ORDER — FREE WATER
200.0000 mL | Freq: Three times a day (TID) | Status: DC
  Administered 2024-05-21 – 2024-05-22 (×3): 200 mL

## 2024-05-21 MED ORDER — PIPERACILLIN-TAZOBACTAM 3.375 G IVPB
3.3750 g | Freq: Three times a day (TID) | INTRAVENOUS | Status: DC
  Administered 2024-05-21 – 2024-05-23 (×6): 3.375 g via INTRAVENOUS
  Filled 2024-05-21 (×6): qty 50

## 2024-05-21 NOTE — Progress Notes (Signed)
 [facility] PROGRESS NOTE    Progress Note  Kathleen Jensen  FMW:[id number] DOB: 12-08-1950 DOA: 05/19/2024 PCP: System, Provider Not In     Brief Narrative:   Kathleen Henandez is an 73 y.o. female nursing home resident with a history of CVA, bedbound with a chronic PEG, indwelling Foley catheter seizure disorder advanced dementia, recently discharged 2 months ago for endocarditis, felt to be not a surgical candidate so she was committed to lifelong suppressive antibiotic treatment PICC line was placed on 04/06/2024, now on oral cefadroxil since 05/07/2024.  Was sent from nursing home because of PEG tube leakage, CT scan of the abdomen pelvis showed jejunostomy tube in place without any obvious complication, adynamic ileus, was found to have a lactic acid of 2.8 hemoglobin is 6.3, repeated 12, no leukocytosis  Assessment/Plan:   Sepsis secondary to infected jejunostomy tube/cellulitis: CT scan of the abdomen pelvis is unremarkable. Interventional radiology was consulted they recommended to apply Desitin to skin surface, tape to tube to prevent slippage.  They recommended is okay to use the tube, also recommended 1 layer drain sponge gauze on the disc at all times. Continue IV Vanco and Rocephin . Urine culture growing more than 100,000 colonies of gram-negative rods. Blood cultures negative till date. She will need to go home on suppressive antibiotic therapy.  For endocarditis.  Acute kidney injury: Likely prerenal azotemia, and holddiuretic therapy. Creatinine has returned to baseline.  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 Continue lifelong suppressive antibiotic therapy  CVA (cerebral vascular accident) (HCC) Continue aspirin .  History of seizure disorder: Continue Keppra  and valproic  acid.  Paroxysmal atrial fibrillation: Rate controlled continue amiodarone  and metoprolol .  Essential hypertension: Continue to hold  losartan .  GERD: Continue famotidine .   Sacral decubitus ulcer stage IV present on admission: Consult wound care. RN Pressure Injury Documentation: Wound 03/26/24 0725 Pressure Injury Coccyx Mid Deep Tissue Pressure Injury - Purple or maroon localized area of discolored intact skin or blood-filled blister due to damage of underlying soft tissue from pressure and/or shear. (Active)    Estimated body mass index is 30.02 kg/m as calculated from the following:   Height as of this encounter: 5' 10 (1.778 m).   Weight as of this encounter: 94.9 kg.  DVT prophylaxis: lovenox  Family Communication:none Status is: Observation The patient will require care spanning > 2 midnights and should be moved to inpatient because: Sepsis    Code Status:     Code Status Orders  (From admission, onward)           Start     Ordered   05/19/24 2255  Full code  Continuous       Question:  By:  Answer:  Consent: discussion documented in EHR   05/19/24 2256           Code Status History     Date Active Date Inactive Code Status Order ID Comments User Context   03/25/2024 1310 04/06/2024 2128 Full Code [id number]  Howell Lunger, DO ED   06/06/2023 1800 06/10/2023 0439 Full Code [id number]  Lou Claretta HERO, MD ED   03/01/2023 0229 03/06/2023 1747 Full Code [id number]  Lee Kingfisher, MD ED   11/16/2022 2216 12/03/2022 1824 DNR [id number]  Fredirick Glenys RAMAN, MD ED   11/16/2022 2042 11/16/2022 2216 DNR [id number]  Yolande Lamar BROCKS, MD ED   11/16/2022 1325 11/16/2022 2042 Full Code [id number]  Yolande Lamar BROCKS, MD ED   05/15/2022 1946 05/18/2022 2321 Full Code [id number]  Howell,  Gladis, DO ED   02/15/2022 1947 02/22/2022 1859 Full Code [id number]  Shelah Lamar RAMAN, MD ED   05/29/2021 1500 06/07/2021 0431 Full Code [id number]  Waddell Rake, MD ED   04/12/2020 0114 04/14/2020 1847 Full Code [id number]  Lonzell Emeline HERO, DO ED   10/05/2017 1544 10/15/2017 1619 Full Code [id number]  Netta Darice HERO, NP ED          IV Access:   Peripheral IV   Procedures and diagnostic studies:   CT ABDOMEN PELVIS W CONTRAST Result Date: 05/19/2024 CLINICAL DATA:  Leaking gastrostomy tube, infection around G-tube site, abdominal pain EXAM: CT ABDOMEN AND PELVIS WITH CONTRAST TECHNIQUE: Multidetector CT imaging of the abdomen and pelvis was performed using the standard protocol following bolus administration of intravenous contrast. RADIATION DOSE REDUCTION: This exam was performed according to the departmental dose-optimization program which includes automated exposure control, adjustment of the mA and/or kV according to patient size and/or use of iterative reconstruction technique. CONTRAST:  OMNIPAQUE  IOHEXOL  300 MG/ML  SOLN COMPARISON:  03/24/2024 FINDINGS: Lower chest: No acute pleural or parenchymal lung disease. Dual lead cardiac pacer. Hepatobiliary: No focal liver abnormality is seen. Status post cholecystectomy. No biliary dilatation. Pancreas: Unremarkable. No pancreatic ductal dilatation or surrounding inflammatory changes. Spleen: Normal in size without focal abnormality. Adrenals/Urinary Tract: There is a 4.3 x 4.0 x 4.8 cm complex cyst upper pole left kidney, with peripheral 1.8 x 1.2 x 1.3 cm nodularity. The peripheral nodularity is increased in prominence since prior exams, and further evaluation with nonemergent outpatient dedicated renal CT is recommended if the patient would be a therapy candidate should neoplasm be detected. Otherwise the kidneys are unremarkable. No urinary tract calculi or obstructive uropathy. The adrenals appear normal. The bladder is decompressed with a Foley catheter in place. Stomach/Bowel: No bowel obstruction or ileus. There is chronic gaseous distension of the colon likely reflecting chronic adynamic colonic ileus. Multiple clips within the cecum. Normal appendix. Postsurgical changes from bariatric surgery. Stable position of the percutaneous change in ostomy tube, tip  within the mid jejunum. No acute findings within the abdominal wall surrounding the jejunostomy catheter site. Vascular/Lymphatic: Aortic atherosclerosis. No enlarged abdominal or pelvic lymph nodes. Reproductive: Stable fibroid uterus.  No adnexal masses. Other: No free fluid or free intraperitoneal gas. No abdominal wall hernia. Musculoskeletal: No acute or destructive bony abnormalities. Stable postsurgical changes within the lower lumbar spine. Reconstructed images demonstrate no additional findings. IMPRESSION: 1. Stable percutaneous jejunostomy tube, without evidence of complication. 2. Chronic gaseous distention of the colon, likely representing chronic adynamic colonic ileus. 3. Complex upper pole left renal cyst, with peripheral nodularity. Further evaluation with nonemergent outpatient dedicated renal CT is recommended if the patient would be a therapy candidate should neoplasm be detected. 4.  Aortic Atherosclerosis (ICD10-I70.0). Electronically Signed   By: Ozell Daring M.D.   On: 05/19/2024 16:59   CT Head Wo Contrast Result Date: 05/19/2024 EXAM: CT HEAD WITHOUT CONTRAST 05/19/2024 04:46:55 PM TECHNIQUE: CT of the head was performed without the administration of intravenous contrast. Automated exposure control, iterative reconstruction, and/or weight based adjustment of the mA/kV was utilized to reduce the radiation dose to as low as reasonably achievable. COMPARISON: Comparison with 07/14/2023. CLINICAL HISTORY: Mental status change, unknown cause. Leaking G-tube, infected around site (staff noticed today). CBG 162. Blood pressure 120/80-90. Oxygen saturation 88% on room air, 98% on 6 liters. Answers questions by nodding head. Decline in mental status. FINDINGS: BRAIN AND VENTRICLES: No acute hemorrhage. No evidence of  acute infarct. Chronic microvascular ischemia and generalized atrophy. Chronic left MCA territory infarct. Chronic right thalamic infarct. Chronic left cerebellar infarcts. No  hydrocephalus. No extra-axial collection. No mass effect or midline shift. ORBITS: No acute abnormality. SINUSES: No acute abnormality. SOFT TISSUES AND SKULL: No acute soft tissue abnormality. No skull fracture. IMPRESSION: 1. No acute intracranial abnormality. Electronically signed by: Norman Gatlin MD 05/19/2024 04:54 PM EDT RP Workstation: [id number]   DG Chest Port 1 View Result Date: 05/19/2024 EXAM: 1 VIEW(S) XRAY OF THE CHEST 05/19/2024 01:56:39 PM COMPARISON: 06/29/2023 CLINICAL HISTORY: Questionable sepsis - evaluate for abnormality. FINDINGS: LUNGS AND PLEURA: Low lung volumes. Elevated right hemidiaphragm, unchanged. No focal pulmonary opacity. No pulmonary edema. No pleural effusion. No pneumothorax. HEART AND MEDIASTINUM: Aortic atherosclerosis. Left pacemaker in place. No acute abnormality of the cardiac and mediastinal silhouettes. BONES AND SOFT TISSUES: No acute osseous abnormality. IMPRESSION: 1. No acute cardiopulmonary process identified. Electronically signed by: Waddell Calk MD 05/19/2024 02:25 PM EDT RP Workstation: [id number]     Medical Consultants:   None.   Subjective:    Kathleen Jensen nonverbal.  Objective:    Vitals:   05/20/24 2240 05/21/24 0202 05/21/24 0617 05/21/24 0821  BP: (!) 141/59 (!) 134/51 (!) 139/56 (!) 121/58  Pulse: 68 69 66 80  Resp: 20 19 20    Temp: 98.8 F (37.1 C) 98.8 F (37.1 C) 98.3 F (36.8 C)   TempSrc: Axillary Axillary Oral   SpO2: 93% 92% 96% 94%  Weight:      Height:       SpO2: 94 %   Intake/Output Summary (Last 24 hours) at 05/21/2024 1038 Last data filed at 05/21/2024 1000 Gross per 24 hour  Intake 1461.4 ml  Output 675 ml  Net 786.4 ml   Filed Weights   05/20/24 1124  Weight: 94.9 kg    Exam: General exam: In no acute distress. Respiratory system: Good air movement and clear to auscultation. Cardiovascular system: S1 & S2 heard, RRR. No JVD. Gastrointestinal system: Abdomen is nondistended,  soft and nontender.  Central nervous system: Alert and oriented. No focal neurological deficits. Extremities: No pedal edema. Skin: Her jejunostomy tube site looks macerated angry and erythematous, the gauze was soaked in purulent drainage. Psychiatry: No judgment or insight of medical condition.   Data Reviewed:    Labs: Basic Metabolic Panel: Recent Labs  Lab 05/19/24 1442 05/19/24 1455 05/20/24 1227 05/21/24 0339  NA 142 145 143 146*  K 4.4 4.3 4.2 3.8  CL 106 105 105 111  CO2 26  --  27 28  GLUCOSE 130* 132* 104* 107*  BUN 51* 49* 44* 38*  CREATININE 0.91 1.10* 0.83 0.72  CALCIUM  8.5*  --  8.7* 8.1*   GFR Estimated Creatinine Clearance: 78.2 mL/min (by C-G formula based on SCr of 0.72 mg/dL). Liver Function Tests: Recent Labs  Lab 05/19/24 1442  AST 57*  ALT 12  ALKPHOS 214*  BILITOT 1.0  PROT 9.0*  ALBUMIN  2.1*   No results for input(s): LIPASE, AMYLASE in the last 168 hours. No results for input(s): AMMONIA in the last 168 hours. Coagulation profile Recent Labs  Lab 05/19/24 1442  INR 1.5*   COVID-19 Labs  No results for input(s): DDIMER, FERRITIN, LDH, CRP in the last 72 hours.  Lab Results  Component Value Date   SARSCOV2NAA NEGATIVE 11/16/2022   SARSCOV2NAA NEGATIVE 02/15/2022   SARSCOV2NAA NEGATIVE 04/11/2020    CBC: Recent Labs  Lab 05/19/24 1442 05/19/24 1455 05/19/24 2152  05/20/24 0551  WBC 13.3*  --  9.9 11.1*  NEUTROABS 6.6  --   --   --   HGB 6.6* 12.9 11.2* 10.9*  HCT 21.7* 38.0 36.1 34.1*  MCV 111.9*  --  107.8* 108.9*  PLT 171  --  140* 157   Cardiac Enzymes: No results for input(s): CKTOTAL, CKMB, CKMBINDEX, TROPONINI in the last 168 hours. BNP (last 3 results) No results for input(s): PROBNP in the last 8760 hours. CBG: No results for input(s): GLUCAP in the last 168 hours. D-Dimer: No results for input(s): DDIMER in the last 72 hours. Hgb A1c: No results for input(s): HGBA1C in the  last 72 hours. Lipid Profile: No results for input(s): CHOL, HDL, LDLCALC, TRIG, CHOLHDL, LDLDIRECT in the last 72 hours. Thyroid  function studies: No results for input(s): TSH, T4TOTAL, T3FREE, THYROIDAB in the last 72 hours.  Invalid input(s): FREET3 Anemia work up: No results for input(s): VITAMINB12, FOLATE, FERRITIN, TIBC, IRON, RETICCTPCT in the last 72 hours. Sepsis Labs: Recent Labs  Lab 05/19/24 1442 05/19/24 1454 05/19/24 1709 05/19/24 2152 05/20/24 0551 05/20/24 0552  WBC 13.3*  --   --  9.9 11.1*  --   LATICACIDVEN  --  2.9* 2.8*  --   --  1.9   Microbiology Recent Results (from the past 240 hours)  Urine Culture     Status: Abnormal (Preliminary result)   Collection Time: 05/19/24  2:22 PM   Specimen: Urine, Random  Result Value Ref Range Status   Specimen Description   Final    URINE, RANDOM Performed at [facility], [address] [place], [place] [address]    Special Requests   Final    NONE Reflexed from [id number] Performed at [facility], [address] [place], [place] [address]    Culture (A)  Final    >=100,000 COLONIES/mL GRAM NEGATIVE RODS IDENTIFICATION AND SUSCEPTIBILITIES TO FOLLOW Performed at [facility], [address] [place], [place] [address]    Report Status PENDING  Incomplete  Blood Culture (routine x 2)     Status: None (Preliminary result)   Collection Time: 05/19/24  2:42 PM   Specimen: BLOOD  Result Value Ref Range Status   Specimen Description   Final    BLOOD BLOOD LEFT ARM Performed at [facility], [address] [place], [place] [address]    Special Requests   Final    BOTTLES DRAWN AEROBIC ONLY Blood Culture results may not be optimal due to an inadequate volume of blood received in culture bottles Performed at [facility], [address] [place], [place] [address]    Culture   Final    NO GROWTH 2  DAYS Performed at [facility], [address], [place], [place] [address]    Report Status PENDING  Incomplete  Blood Culture (routine x 2)     Status: None (Preliminary result)   Collection Time: 05/19/24  4:15 PM   Specimen: BLOOD  Result Value Ref Range Status   Specimen Description   Final    BLOOD LEFT ANTECUBITAL Performed at [facility], [address] [place], [place] [address]    Special Requests   Final    BOTTLES DRAWN AEROBIC AND ANAEROBIC Blood Culture adequate volume Performed at [facility], [address], [place], [place] [address]    Culture   Final    NO GROWTH 2 DAYS Performed at [facility], [address] [place], [place] [address]    Report Status PENDING  Incomplete     Medications:    sodium chloride    Intravenous Once   amiodarone   200 mg Per Tube Daily   aspirin   81 mg Per Tube QHS   Chlorhexidine  Gluconate Cloth  6 each Topical Daily   cyclobenzaprine   2.5 mg Per Tube Q12H   docusate  100 mg Per Tube BID   famotidine   40 mg Per Tube Daily   folic acid   1 mg Per Tube Daily   levETIRAcetam   1,000 mg Per Tube BID   liver oil-zinc  oxide   Topical BID   metoprolol  tartrate  25 mg Per Tube BID   multivitamin with minerals  1 tablet Per Tube Daily   simethicone   80 mg Per Tube TID   simvastatin   10 mg Per Tube QHS   sodium chloride  flush  3 mL Intravenous Q12H   valproic  acid  500 mg Per Tube TID with meals   And   valproic  acid  750 mg Per Tube QHS   Continuous Infusions:  cefTRIAXone  (ROCEPHIN )  IV 2 g (05/21/24 0845)   lactated ringers  Stopped (05/21/24 0848)   [START ON 05/22/2024] vancomycin         LOS: 1 day   Erle Odell Castor  [facility]  05/21/2024, 10:38 AM

## 2024-05-21 NOTE — TOC Initial Note (Signed)
 Transition of Care [facility]) - Initial/Assessment Note    Patient Details  Name: Kathleen Jensen MRN: [id number] Date of Birth: [birth year]  [facility] CM/SW Contact:    Alfonse JONELLE Rex, RN Phone Number: 05/21/2024, 1:06 PM  Clinical Narrative:   Patient admitted from [facility], confirmed with [place], admit coordinator, patient is LTC. Patient has a Armed forces operational officer Guardian on file, [name] Search, DSS, contact number : [phone] . NCM called to [facility] to introduce role of TOC/ NCM and review for dc planning, no answer, vm left with NCM name and phone for contact.  Palliative Medicine Team following. TOC will continue to follow.                    Expected Discharge Plan: Long Term Nursing Home (LTC at [facility]) Barriers to Discharge: Continued Medical Work up   Patient Goals and CMS Choice Patient states their goals for this hospitalization and ongoing recovery are:: Return to [facility].gov Compare Post Acute Care list provided to:: Legal Guardian Education officer, museum (Legal Guardian)  [phone] (Mobile)) Choice offered to / list presented to : [facility] POA / Guardian Education officer, museum (Legal Guardian)  [phone] (Mobile)) [place] ownership interest in [facility].provided to:: [facility] POA / Guardian Education officer, museum (Legal Guardian)  [phone] (Mobile))    Expected Discharge Plan and Services       Living arrangements for the past 2 months: Skilled Nursing Facility [facility] LTC)                                      Prior Living Arrangements/Services Living arrangements for the past 2 months: Skilled Nursing Facility Risk analyst LTC) Lives with:: Other (Comment) [facility] analyst [facility] LTC) Patient language and need for interpreter reviewed:: Yes        Need for Family Participation in Patient Care: Yes (Comment) Care giver support system in place?: Yes (comment)   Criminal Activity/Legal Involvement Pertinent to Current  Situation/Hospitalization: No - Comment as needed  Activities of Daily Living   ADL Screening (condition at time of admission) Independently performs ADLs?: No Does the patient have a NEW difficulty with bathing/dressing/toileting/self-feeding that is expected to last >3 days?: No Does the patient have a NEW difficulty with getting in/out of bed, walking, or climbing stairs that is expected to last >3 days?: No Does the patient have a NEW difficulty with communication that is expected to last >3 days?: No Is the patient deaf or have difficulty hearing?: No (uta) Does the patient have difficulty seeing, even when wearing glasses/contacts?: No (uta) Does the patient have difficulty concentrating, remembering, or making decisions?: Yes  Permission Sought/Granted                  Emotional Assessment         Alcohol / Substance Use: Not Applicable Psych Involvement: No (comment)  Admission diagnosis:  Acute cystitis without hematuria [N30.00] Elevated lactic acid level [R79.89] Sepsis (HCC) [A41.9] Complication of feeding tube (HCC) [K94.20] Anemia, unspecified type [D64.9] Patient Active Problem List   Diagnosis Date Noted   Sepsis (HCC) 05/20/2024   Elevated lactic acid level 05/19/2024   Hypoglycemia 04/01/2024   Infection and inflammatory reaction due to cardiac device, implant, and graft 04/01/2024   Hypocalcemia 03/31/2024   PEG tube malfunction (HCC) 03/31/2024   Bacteremia 03/25/2024   Chronic health problem 03/25/2024   Ileus (HCC) 06/06/2023   Acute  cystitis 03/01/2023   Delirium secondary to UTI 03/01/2023   Atrial fibrillation, chronic (HCC) 03/01/2023   History of seizure 03/01/2023   Chronic hyponatremia 03/01/2023   Acute respiratory failure (HCC) 11/16/2022   Abdominal distension 11/16/2022   AMS (altered mental status) 05/17/2022   Altered mental status 05/15/2022   Sacral wound 05/15/2022   Intracranial hemorrhage (HCC)    Pressure injury of skin  02/18/2022   Chronic indwelling Foley catheter 02/18/2022   PAF (paroxysmal atrial fibrillation) (HCC) 02/18/2022   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HCC) 02/18/2022   Septic shock (HCC) 02/15/2022   Constipation    Abdominal pain    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   History of right hemiplegia (HCC) 04/12/2020   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   Hyperlipidemia associated with type 2 diabetes mellitus (HCC) 07/30/2018   History of CVA (cerebrovascular accident) 07/30/2018   Vitamin B 12 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/30/2018   Asthma 03/13/2018   Seizures (HCC) 01/12/2018   CVA (cerebral vascular accident) (HCC) 12/08/2017   Abnormal thyroid  blood test 12/08/2017   Pulmonary embolism and infarction (HCC) 12/08/2017   Chronic anemia 12/08/2017   History of expressive aphasia 10/15/2017   Seizure disorder (HCC) 10/15/2017   Dysphagia 10/15/2017   Hyperlipidemia 09/22/2017   Urinary frequency 09/22/2017   Preoperative clearance 11/19/2016   Airway hyperreactivity 01/26/2016   Disease of thyroid  gland 01/26/2016   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   Other specified postprocedural states 01/26/2016   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   Chronic low back pain 04/10/2015   Degeneration of intervertebral disc of lumbar region 04/10/2015   Spondylolisthesis of lumbar region 04/10/2015   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   History of cardiac pacemaker in situ 01/09/2015   B12 DEFICIENCY 08/15/2008   Vitamin D  deficiency 08/15/2008   Sick sinus syndrome (HCC) 08/15/2008   OBSTRUCTIVE SLEEP APNEA 08/11/2008   INSOMNIA 04/28/2008   Hyperlipidemia LDL goal <70 12/12/2006   RHINITIS, ALLERGIC NEC 12/12/2006   Hypothyroidism 12/11/2006   Essential hypertension 12/11/2006   GERD 12/11/2006   STRESS INCONTINENCE 12/11/2006   PCP:  System, Provider Not In Pharmacy:  No Pharmacies Listed    Social Drivers of Health (SDOH) Social  History: SDOH Screenings   Food Insecurity: Patient Unable To Answer (05/20/2024)  Housing: Patient Unable To Answer (05/20/2024)  Transportation Needs: Patient Unable To Answer (03/25/2024)  Utilities: Patient Unable To Answer (03/25/2024)  Depression (PHQ2-9): Low Risk  (05/22/2019)  Social Connections: Unknown (03/25/2024)  Tobacco Use: Medium Risk (05/19/2024)   SDOH Interventions:     Readmission Risk Interventions    05/21/2024    1:03 PM 02/18/2022    2:23 PM  Readmission Risk Prevention Plan  Transportation Screening Complete Complete  PCP or Specialist Appt within 5-7 Days  Complete  Home Care Screening  Complete  Medication Review (RN CM)  Complete  Medication Review (RN Care Manager) Complete   PCP or Specialist appointment within 3-5 days of discharge Complete   HRI or Home Care Consult Complete   SW Recovery Care/Counseling Consult Complete   Palliative Care Screening Complete   Skilled Nursing Facility Not Applicable

## 2024-05-21 NOTE — Progress Notes (Addendum)
 PHARMACY NOTE:  ANTIMICROBIAL RENAL DOSAGE ADJUSTMENT  Current antimicrobial regimen includes a mismatch between antimicrobial dosage and estimated renal function.  As per policy approved by the Pharmacy & Therapeutics and Medical Executive Committees, the antimicrobial dosage will be adjusted accordingly.  Current antimicrobial dosage:  Vancomycin  1250mg  IV q24h (Scr 1.1, eAUC 514.5, Vd 0.5)  Indication: infected jejunostomy tube, cellulitis   Renal Function:  Estimated Creatinine Clearance: 78.2 mL/min (by C-G formula based on SCr of 0.72 mg/dL).     Antimicrobial dosage has been changed to:  Vancomycin  1500mg  IV q24h (Scr used 0.8, eAUC 521.5, Vd 0.5)   Thank you for allowing pharmacy to be a part of this patient's care.  Marget Hench, [facility] 05/21/2024 8:11 AM

## 2024-05-21 NOTE — Consult Note (Signed)
 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  During hospitalization, patient has received management for sepsis secondary to infected jejunostomy tube/cellulitis, AKI, and wound care for sacral decubitus stage IV ulcer present on admission.  Palliative medicine team consulted to assist with complex medical decision making.  Extensive review of EMR including recent documentation from hospitalist, TOC, WOC, and IR.  IR evaluated tube and provided recommendations for skin care to assist with tube management.  Noted could appropriately continue use of tube.  Patient's AKI has improved at this time with medical management.  Reviewed recent BMP noting GFR estimated over 60. It is noted in EMR the patient has legal guardian in place through DSS, [name] Search.  Also on EMR review, patient has previous MOST form that had been completed in 2024 by patient's daughter which noted: Full code, full scope of treatment, antibiotics if indicated, IV fluids if indicated, and feeding tube long-term if indicated.  Patient has since been placed under care of guardian.  Presented to bedside to see patient.  When presenting to bedside, patient's daughter, [name], was present.  Patient was laying comfortably in the bed.  Patient tried to mumble to greet this provider and noted to  smile at times.  Patient not able to participate in complex medical decision making.  Did introduce myself as a member of the palliative medicine team to patient's daughter at bedside.  Upon introduction, daughter had multiple questions for this provider.  Daughter seemed confrontational about palliative medicine involvement.  Spent time explaining palliative medicine services.  Daughter noted that she has been informed by DSS guardian that there has been a recommendation for patient to be transition to comfort care and that family should come in to say goodbye.  Noted this was first time this provider had been involved in patient's care so trying to learn about the situation.  Did discuss patient noted to have legal guardian on chart.  Daughter became frustrated noting that patient only has a guardian because she could not afford to pay for patient's nursing home and so her rights for the patient were taken away from her.  Again empathized with situation.  Encouraged daughter to discuss with DSS situation.  Daughter became tearful during conversation noting who are we to decide when she dies?.  Did spend time normalizing grieving in response to patient's multiple underlying medical illnesses.  Daughter expressed that patient had always wanted everything done and she is trying to honor her mother's wishes regarding this.  Again encouraged conversations with DSS guardian since patient does have this person to make decisions on patient's behalf.  Daughter was inquiring about CODE STATUS which is noted to currently be full code.  Discussed patient has been receiving appropriate medical interventions at this time.  Did also explain what CODE STATUS of DNR/DNI would mean which daughter acknowledges is appropriate though also became frustrated with  again saying we are just trying to kill her mother.  Noted would reach out to DSS guardian for further conversations.  Tried to provide emotional support via active  listening.  Called DSS guardian, [name] Search, multiple times throughout the day.  Left a message to return call.  TOC also unable to get a hold of guardian.  Discussed care with hospitalist, TOC, and bedside RN to coordinate care.  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)    Hyperlipemia    Hypertension    Insomnia    Intracerebral hemorrhage, intraventricular (HCC)    Muscle weakness    Other abnormalities of gait and mobility    Seizures (HCC)    Stroke (HCC)    Unsteadiness on feet    Vitamin B deficiency    Social History   Socioeconomic History   Marital status: Divorced    Spouse name: Not on file   Number of children: 3   Years of education: 89yrs   Highest education level: Not on file  Occupational History   Occupation: Retired    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   Caffeine Use: 1 cup daily   1 dog   Social Drivers of  Corporate investment banker Strain: Not on file  Food Insecurity: Patient Unable To Answer (05/20/2024)   Hunger Vital Sign    Worried About Running Out of Food in the Last Year: Patient unable to answer    Ran Out of Food in the Last Year: Patient unable to answer  Transportation Needs: Patient Unable To Answer (03/25/2024)   PRAPARE - Transportation    Lack of Transportation (Medical): Patient unable to answer    Lack of Transportation (Non-Medical): Patient unable to answer  Physical Activity: Not on file  Stress: Not on file  Social Connections: Unknown (03/25/2024)   Social Connection and Isolation Panel    Frequency of Communication with Friends and Family: Patient unable to answer    Frequency of Social Gatherings with Friends and Family: Patient unable to answer    Attends Religious Services: Patient unable to answer    Active Member of Clubs or Organizations: Not on file    Attends Banker Meetings: Patient unable to answer    Marital Status: Patient unable to answer   Family  History  Problem Relation Age of Onset   High blood pressure Mother    Diabetes Mother    Diabetes Father    Stroke Father    Cancer Sister        Colorectal cancer   Cancer Brother        colon   Scheduled Meds:  sodium chloride    Intravenous Once   amiodarone   200 mg Per Tube Daily   aspirin   81 mg Per Tube QHS   Chlorhexidine  Gluconate Cloth  6 each Topical Daily   cyclobenzaprine   2.5 mg Per Tube Q12H   docusate  100 mg Per Tube BID   enoxaparin  (LOVENOX ) injection  45 mg Subcutaneous Q24H   famotidine   40 mg Per Tube Daily   folic acid   1 mg Per Tube Daily   free water   200 mL Per Tube Q8H   levETIRAcetam   1,000 mg Per Tube BID   liver oil-zinc  oxide   Topical BID   metoprolol  tartrate  25 mg Per Tube BID   multivitamin with minerals  1 tablet Per Tube Daily   simethicone   80 mg Per Tube TID   simvastatin   10 mg Per Tube QHS   sodium chloride  flush  3 mL Intravenous Q12H    valproic  acid  500 mg Per Tube TID with meals   And   valproic  acid  750 mg Per Tube QHS   Continuous Infusions:  lactated ringers  Stopped (05/21/24 0848)   piperacillin-tazobactam (ZOSYN)  IV 3.375 g (05/21/24 1511)   PRN Meds:.acetaminophen  **OR** acetaminophen  (TYLENOL ) oral liquid 160 mg/5 mL, albuterol , diazepam , ondansetron  **OR** ondansetron  (ZOFRAN ) IV Allergies  Allergen Reactions   Tape Rash   Demerol [Meperidine Hcl] Nausea And Vomiting   Sulfonamide Derivatives Itching   Bacitracin-Polymyxin B Dermatitis    Not listed on the [facility]   Oxycontin  [Oxycodone ] Itching   CBC:    Component Value Date/Time   WBC 11.1 (H) 05/20/2024 0551   HGB 10.9 (L) 05/20/2024 0551   HCT 34.1 (L) 05/20/2024 0551   PLT 157 05/20/2024 0551   MCV 108.9 (H) 05/20/2024 0551   NEUTROABS 6.6 05/19/2024 1442   LYMPHSABS 4.5 (H) 05/19/2024 1442   MONOABS 2.0 (H) 05/19/2024 1442   EOSABS 0.1 05/19/2024 1442   BASOSABS 0.0 05/19/2024 1442   Comprehensive Metabolic Panel:    Component Value Date/Time   NA 146 (H) 05/21/2024 0339   K 3.8 05/21/2024 0339   CL 111 05/21/2024 0339   CO2 28 05/21/2024 0339   BUN 38 (H) 05/21/2024 0339   CREATININE 0.72 05/21/2024 0339   GLUCOSE 107 (H) 05/21/2024 0339   GLUCOSE 122 (H) 07/07/2006 1222   CALCIUM  8.1 (L) 05/21/2024 0339   AST 57 (H) 05/19/2024 1442   ALT 12 05/19/2024 1442   ALKPHOS 214 (H) 05/19/2024 1442   BILITOT 1.0 05/19/2024 1442   PROT 9.0 (H) 05/19/2024 1442   ALBUMIN  2.1 (L) 05/19/2024 1442    Physical Exam: Vital Signs: BP (!) 143/76 (BP Location: Right Leg)   Pulse 68   Temp 98.3 F (36.8 C) (Oral)   Resp 16   Ht 5' 10 (1.778 m)   Wt 94.9 kg   SpO2 95%   BMI 30.02 kg/m  SpO2: SpO2: 95 % O2 Device: O2 Device: Room Air O2 Flow Rate:   Intake/output summary:  Intake/Output Summary (Last 24 hours) at 05/21/2024 1530 Last data filed at 05/21/2024 1259 Gross per 24 hour  Intake 1661.4 ml  Output  675 ml  Net 986.4 ml    LBM: Last BM Date : 05/20/24 Baseline Weight: Weight: 94.9 kg Most recent weight: Weight: 94.9 kg  General: NAD, awake, mumbling incoherently at times Cardiovascular: RRR Respiratory: no increased work of breathing noted, not in respiratory distress Abdomen: not distended Skin: Reviewed wound care imaging Neuro: awake, not following commands, mumbling though incoherently          Palliative Performance Scale: 10%              Additional Data Reviewed: Recent Labs    05/19/24 2152 05/20/24 0551 05/20/24 1227 05/21/24 0339  WBC 9.9 11.1*  --   --   HGB 11.2* 10.9*  --   --   PLT 140* 157  --   --   NA  --   --  143 146*  BUN  --   --  44* 38*  CREATININE  --   --  0.83 0.72    Imaging: CT ABDOMEN PELVIS W CONTRAST CLINICAL DATA:  Leaking gastrostomy tube, infection around G-tube site, abdominal pain  EXAM: CT ABDOMEN AND PELVIS WITH CONTRAST  TECHNIQUE: Multidetector CT imaging of the abdomen and pelvis was performed using the standard protocol following bolus administration of intravenous contrast.  RADIATION DOSE REDUCTION: This exam was performed according to the departmental dose-optimization program which includes automated exposure control, adjustment of the mA and/or kV according to patient size and/or use of iterative reconstruction technique.  CONTRAST:  OMNIPAQUE  IOHEXOL  300 MG/ML  SOLN  COMPARISON:  03/24/2024  FINDINGS: Lower chest: No acute pleural or parenchymal lung disease. Dual lead cardiac pacer.  Hepatobiliary: No focal liver abnormality is seen. Status post cholecystectomy. No biliary dilatation.  Pancreas: Unremarkable. No pancreatic ductal dilatation or surrounding inflammatory changes.  Spleen: Normal in size without focal abnormality.  Adrenals/Urinary Tract: There is a 4.3 x 4.0 x 4.8 cm complex cyst upper pole left kidney, with peripheral 1.8 x 1.2 x 1.3 cm nodularity. The peripheral nodularity is increased in  prominence since prior exams, and further evaluation with nonemergent outpatient dedicated renal CT is recommended if the patient would be a therapy candidate should neoplasm be detected.  Otherwise the kidneys are unremarkable. No urinary tract calculi or obstructive uropathy. The adrenals appear normal. The bladder is decompressed with a Foley catheter in place.  Stomach/Bowel: No bowel obstruction or ileus. There is chronic gaseous distension of the colon likely reflecting chronic adynamic colonic ileus. Multiple clips within the cecum. Normal appendix.  Postsurgical changes from bariatric surgery. Stable position of the percutaneous change in ostomy tube, tip within the mid jejunum. No acute findings within the abdominal wall surrounding the jejunostomy catheter site.  Vascular/Lymphatic: Aortic atherosclerosis. No enlarged abdominal or pelvic lymph nodes.  Reproductive: Stable fibroid uterus.  No adnexal masses.  Other: No free fluid or free intraperitoneal gas. No abdominal wall hernia.  Musculoskeletal: No acute or destructive bony abnormalities. Stable postsurgical changes within the lower lumbar spine. Reconstructed images demonstrate no additional findings.  IMPRESSION: 1. Stable percutaneous jejunostomy tube, without evidence of complication. 2. Chronic gaseous distention of the colon, likely representing chronic adynamic colonic ileus. 3. Complex upper pole left renal cyst, with peripheral nodularity. Further evaluation with nonemergent outpatient dedicated renal CT is recommended if the patient would be a therapy candidate should neoplasm be detected. 4.  Aortic Atherosclerosis (ICD10-I70.0).  Electronically Signed   By: Ozell Daring M.D.   On: 05/19/2024 16:59 CT Head Wo Contrast EXAM: CT HEAD WITHOUT  CONTRAST 05/19/2024 04:46:55 PM  TECHNIQUE: CT of the head was performed without the administration of intravenous contrast. Automated exposure  control, iterative reconstruction, and/or weight based adjustment of the mA/kV was utilized to reduce the radiation dose to as low as reasonably achievable.  COMPARISON: Comparison with 07/14/2023.  CLINICAL HISTORY: Mental status change, unknown cause. Leaking G-tube, infected around site (staff noticed today). CBG 162. Blood pressure 120/80-90. Oxygen saturation 88% on room air, 98% on 6 liters. Answers questions by nodding head. Decline in mental status.  FINDINGS:  BRAIN AND VENTRICLES: No acute hemorrhage. No evidence of acute infarct. Chronic microvascular ischemia and generalized atrophy. Chronic left MCA territory infarct. Chronic right thalamic infarct. Chronic left cerebellar infarcts. No hydrocephalus. No extra-axial collection. No mass effect or midline shift.  ORBITS: No acute abnormality.  SINUSES: No acute abnormality.  SOFT TISSUES AND SKULL: No acute soft tissue abnormality. No skull fracture.  IMPRESSION: 1. No acute intracranial abnormality.  Electronically signed by: Norman Gatlin MD 05/19/2024 04:54 PM EDT RP Workstation: [id number] DG Chest Port 1 View EXAM: 1 VIEW(S) XRAY OF THE CHEST 05/19/2024 01:56:39 PM  COMPARISON: 06/29/2023  CLINICAL HISTORY: Questionable sepsis - evaluate for abnormality.  FINDINGS:  LUNGS AND PLEURA: Low lung volumes. Elevated right hemidiaphragm, unchanged. No focal pulmonary opacity. No pulmonary edema. No pleural effusion. No pneumothorax.  HEART AND MEDIASTINUM: Aortic atherosclerosis. Left pacemaker in place. No acute abnormality of the cardiac and mediastinal silhouettes.  BONES AND SOFT TISSUES: No acute osseous abnormality.  IMPRESSION: 1. No acute cardiopulmonary process identified.  Electronically signed by: Waddell Calk MD 05/19/2024 02:25 PM EDT RP Workstation: [id number]    I personally reviewed recent imaging.   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  During hospitalization, patient has received management for sepsis secondary to infected jejunostomy tube/cellulitis, AKI, and wound care for sacral decubitus stage IV ulcer present on admission.  Palliative medicine team consulted to assist with complex medical decision making.  # Complex medical decision making/goals of care  - Patient unable to participate in complex medical decision making due to underlying medical status.  - Attempted to call patient's DSS guardian, [name] Search, multiple times throughout the day to discuss patient's care.  Unable to reach; left contact information.  [name], [name], was at bedside at time of visit and discussed care as detailed above in HPI.  Daughter visibly frustrated regarding patient's medical situation.  Tried to provide emotional support and counseling as appropriate.  Palliative medicine team continuing to engage in conversations as able and appropriate moving forward.  -  Code Status: Full Code    - # Psycho-social/Spiritual Support:  - Support System: Legal Guardian- [name] Search; Family including daughters listed in chart   # Discharge Planning:  To Be Determined  Thank you for allowing the palliative care team to participate in the care Finlee Trudy Collet.  Tinnie Radar, DO Palliative Care Provider PMT # [phone]  If patient remains symptomatic despite maximum doses, please call PMT at [phone] between 0700 and 1900. Outside of these hours, please call attending, as PMT does not have night  coverage.  Personally spent 75 minutes in patient care including extensive chart review (labs, imaging, progress/consult notes, vital signs), medically appropraite exam, discussed with treatment team, education to patient, family, and staff, documenting clinical information, medication review and management, coordination of care, and available advanced directive documents.

## 2024-05-21 NOTE — Plan of Care (Signed)
  Problem: Safety: Goal: Ability to remain free from injury will improve Outcome: Progressing   Problem: Pain Managment: Goal: General experience of comfort will improve and/or be controlled Outcome: Progressing   Problem: Coping: Goal: Level of anxiety will decrease Outcome: Progressing   Problem: Education: Goal: Knowledge of General Education information will improve Description: Including pain rating scale, medication(s)/side effects and non-pharmacologic comfort measures Outcome: Progressing

## 2024-05-22 DIAGNOSIS — D649 Anemia, unspecified: Secondary | ICD-10-CM | POA: Diagnosis not present

## 2024-05-22 DIAGNOSIS — K9420 Gastrostomy complication, unspecified: Secondary | ICD-10-CM | POA: Diagnosis not present

## 2024-05-22 DIAGNOSIS — N3000 Acute cystitis without hematuria: Secondary | ICD-10-CM | POA: Diagnosis not present

## 2024-05-22 LAB — BASIC METABOLIC PANEL WITH GFR
Anion gap: 9 (ref 5–15)
BUN: 28 mg/dL — ABNORMAL HIGH (ref 8–23)
CO2: 25 mmol/L (ref 22–32)
Calcium: 8.1 mg/dL — ABNORMAL LOW (ref 8.9–10.3)
Chloride: 114 mmol/L — ABNORMAL HIGH (ref 98–111)
Creatinine, Ser: 0.88 mg/dL (ref 0.44–1.00)
GFR, Estimated: >60 mL/min (ref >60)
Glucose, Bld: 107 mg/dL — ABNORMAL HIGH (ref 70–99)
Potassium: 3.7 mmol/L (ref 3.5–5.1)
Sodium: 148 mmol/L — ABNORMAL HIGH (ref 135–145)

## 2024-05-22 MED ORDER — FREE WATER
200.0000 mL | Status: DC
  Administered 2024-05-22 – 2024-05-31 (×55): 200 mL

## 2024-05-22 MED ORDER — ENOXAPARIN SODIUM 40 MG/0.4ML IJ SOSY
40.0000 mg | PREFILLED_SYRINGE | INTRAMUSCULAR | Status: DC
  Administered 2024-05-22 – 2024-05-31 (×10): 40 mg via SUBCUTANEOUS
  Filled 2024-05-22 (×10): qty 0.4

## 2024-05-22 NOTE — Plan of Care (Signed)

## 2024-05-22 NOTE — Progress Notes (Addendum)
 [facility] PROGRESS NOTE    Progress Note  Kathleen Jensen  FMW:[id number] DOB: 11-Jun-1951 DOA: 05/19/2024 PCP: System, Provider Not In     Brief Narrative:   Kathleen Jensen is an 73 y.o. female nursing home resident with a history of CVA, bedbound with a chronic PEG, indwelling Foley catheter seizure disorder advanced dementia, recently discharged 2 months ago for endocarditis, felt to be not a surgical candidate so she was committed to lifelong suppressive antibiotic treatment PICC line was placed on 04/06/2024, now on oral cefadroxil since 05/07/2024.  Was sent from nursing home because of PEG tube leakage, CT scan of the abdomen pelvis showed jejunostomy tube in place without any obvious complication, adynamic ileus, was found to have a lactic acid of 2.8 hemoglobin is 6.3, repeated 12, no leukocytosis. Assessment/Plan:  Sepsis secondary cellulitis and Pseudomonas UTI: CT scan of the abdomen pelvis is unremarkable. Interventional radiology was consulted they recommended to apply Desitin to skin surface, tape to tube to prevent slippage.   Urine culture grew Pseudomonas.  Has been transition to IV Zosyn. Blood cultures negative till date. She will need to go home on suppressive antibiotic therapy for endocarditis.  Acute kidney injury: Likely prerenal azotemia, and hold diuretic therapy. Creatinine has returned to baseline.  Hypovolemic hyponatremia: Increase free water  per tube Recheck basic metabolic panel in the morning.  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 Continue lifelong suppressive antibiotic therapy  CVA (cerebral vascular accident) (HCC) Continue aspirin .  History of seizure disorder: Continue Keppra  and valproic  acid.  Paroxysmal atrial fibrillation: Rate controlled continue amiodarone  and metoprolol .  Essential hypertension: She should probably go back to skilled nursing facility without ARB.  GERD: Continue  famotidine .   Sacral decubitus ulcer stage IV present on admission: Consult wound care. RN Pressure Injury Documentation: Wound 03/26/24 0725 Pressure Injury Coccyx Mid Deep Tissue Pressure Injury - Purple or maroon localized area of discolored intact skin or blood-filled blister due to damage of underlying soft tissue from pressure and/or shear. (Active)    Estimated body mass index is 30.02 kg/m as calculated from the following:   Height as of this encounter: 5' 10 (1.778 m).   Weight as of this encounter: 94.9 kg.  DVT prophylaxis: lovenox  Family Communication:none Status is: Observation The patient will require care spanning > 2 midnights and should be moved to inpatient because: Sepsis    Code Status:     Code Status Orders  (From admission, onward)           Start     Ordered   05/19/24 2255  Full code  Continuous       Question:  By:  Answer:  Consent: discussion documented in EHR   05/19/24 2256           Code Status History     Date Active Date Inactive Code Status Order ID Comments User Context   03/25/2024 1310 04/06/2024 2128 Full Code [id number]  Howell Lunger, DO ED   06/06/2023 1800 06/10/2023 0439 Full Code [id number]  Lou Claretta HERO, MD ED   03/01/2023 0229 03/06/2023 1747 Full Code [id number]  Lee Kingfisher, MD ED   11/16/2022 2216 12/03/2022 1824 DNR [id number]  Fredirick Glenys RAMAN, MD ED   11/16/2022 2042 11/16/2022 2216 DNR [id number]  Yolande Lamar BROCKS, MD ED   11/16/2022 1325 11/16/2022 2042 Full Code [id number]  Yolande Lamar BROCKS, MD ED   05/15/2022 1946 05/18/2022 2321 Full Code [id number]  Howell Lunger, DO ED  02/15/2022 1947 02/22/2022 1859 Full Code [id number]  Shelah Lamar RAMAN, MD ED   05/29/2021 1500 06/07/2021 0431 Full Code [id number]  Waddell Rake, MD ED   04/12/2020 0114 04/14/2020 1847 Full Code [id number]  Lonzell Emeline HERO, DO ED   10/05/2017 1544 10/15/2017 1619 Full Code [id number]  Black, Darice HERO, NP ED         IV Access:   Peripheral  IV   Procedures and diagnostic studies:   No results found.    Medical Consultants:   None.   Subjective:    Kathleen Jensen nonverbal this morning.  Objective:    Vitals:   05/21/24 0821 05/21/24 1348 05/21/24 2150 05/22/24 0602  BP: (!) 121/58 (!) 143/76 132/81 121/65  Pulse: 80 68 77 64  Resp:  16 16 16   Temp:    98.8 F (37.1 C)  TempSrc:    Oral  SpO2: 94% 95% 93% 94%  Weight:      Height:       SpO2: 94 %   Intake/Output Summary (Last 24 hours) at 05/22/2024 0917 Last data filed at 05/22/2024 0600 Gross per 24 hour  Intake 1129.51 ml  Output 600 ml  Net 529.51 ml   Filed Weights   05/20/24 1124  Weight: 94.9 kg    Exam: General exam: In no acute distress. Respiratory system: Good air movement and clear to auscultation. Cardiovascular system: S1 & S2 heard, RRR. No JVD. Gastrointestinal system: Abdomen is nondistended, soft and nontender, G-tube in place. Extremities: No pedal edema. Skin: No rashes, lesions or ulcers Psychiatry: Judgement and insight appear normal. Mood & affect appropriate.  Data Reviewed:    Labs: Basic Metabolic Panel: Recent Labs  Lab 05/19/24 1442 05/19/24 1455 05/20/24 1227 05/21/24 0339 05/22/24 0409  NA 142 145 143 146* 148*  K 4.4 4.3 4.2 3.8 3.7  CL 106 105 105 111 114*  CO2 26  --  27 28 25   GLUCOSE 130* 132* 104* 107* 107*  BUN 51* 49* 44* 38* 28*  CREATININE 0.91 1.10* 0.83 0.72 0.88  CALCIUM  8.5*  --  8.7* 8.1* 8.1*   GFR Estimated Creatinine Clearance: 71.1 mL/min (by C-G formula based on SCr of 0.88 mg/dL). Liver Function Tests: Recent Labs  Lab 05/19/24 1442  AST 57*  ALT 12  ALKPHOS 214*  BILITOT 1.0  PROT 9.0*  ALBUMIN  2.1*   No results for input(s): LIPASE, AMYLASE in the last 168 hours. No results for input(s): AMMONIA in the last 168 hours. Coagulation profile Recent Labs  Lab 05/19/24 1442  INR 1.5*   COVID-19 Labs  No results for input(s): DDIMER,  FERRITIN, LDH, CRP in the last 72 hours.  Lab Results  Component Value Date   SARSCOV2NAA NEGATIVE 11/16/2022   SARSCOV2NAA NEGATIVE 02/15/2022   SARSCOV2NAA NEGATIVE 04/11/2020    CBC: Recent Labs  Lab 05/19/24 1442 05/19/24 1455 05/19/24 2152 05/20/24 0551  WBC 13.3*  --  9.9 11.1*  NEUTROABS 6.6  --   --   --   HGB 6.6* 12.9 11.2* 10.9*  HCT 21.7* 38.0 36.1 34.1*  MCV 111.9*  --  107.8* 108.9*  PLT 171  --  140* 157   Cardiac Enzymes: No results for input(s): CKTOTAL, CKMB, CKMBINDEX, TROPONINI in the last 168 hours. BNP (last 3 results) No results for input(s): PROBNP in the last 8760 hours. CBG: No results for input(s): GLUCAP in the last 168 hours. D-Dimer: No results for input(s): DDIMER in the last 72 hours.  Hgb A1c: No results for input(s): HGBA1C in the last 72 hours. Lipid Profile: No results for input(s): CHOL, HDL, LDLCALC, TRIG, CHOLHDL, LDLDIRECT in the last 72 hours. Thyroid  function studies: No results for input(s): TSH, T4TOTAL, T3FREE, THYROIDAB in the last 72 hours.  Invalid input(s): FREET3 Anemia work up: No results for input(s): VITAMINB12, FOLATE, FERRITIN, TIBC, IRON, RETICCTPCT in the last 72 hours. Sepsis Labs: Recent Labs  Lab 05/19/24 1442 05/19/24 1454 05/19/24 1709 05/19/24 2152 05/20/24 0551 05/20/24 0552  WBC 13.3*  --   --  9.9 11.1*  --   LATICACIDVEN  --  2.9* 2.8*  --   --  1.9   Microbiology Recent Results (from the past 240 hours)  Urine Culture     Status: Abnormal   Collection Time: 05/19/24  2:22 PM   Specimen: Urine, Random  Result Value Ref Range Status   Specimen Description   Final    URINE, RANDOM Performed at [facility], [address], [place], [place] [address]    Special Requests   Final    NONE Reflexed from [id number] Performed at [facility], [address], [place], [place] [address]    Culture >=100,000  COLONIES/mL PSEUDOMONAS AERUGINOSA (A)  Final   Report Status 05/21/2024 FINAL  Final   Organism ID, Bacteria PSEUDOMONAS AERUGINOSA (A)  Final      Susceptibility   Pseudomonas aeruginosa - MIC*    MEROPENEM 0.5 SENSITIVE Sensitive     CIPROFLOXACIN 0.12 SENSITIVE Sensitive     IMIPENEM 2 SENSITIVE Sensitive     PIP/TAZO Value in next row Sensitive      <=4 SENSITIVEThis is a modified FDA-approved test that has been validated and its performance characteristics determined by the reporting laboratory.  This laboratory is certified under the Clinical Laboratory Improvement Amendments CLIA as qualified to perform high complexity clinical laboratory testing.    CEFEPIME  Value in next row Sensitive      <=4 SENSITIVEThis is a modified FDA-approved test that has been validated and its performance characteristics determined by the reporting laboratory.  This laboratory is certified under the Clinical Laboratory Improvement Amendments CLIA as qualified to perform high complexity clinical laboratory testing.    CEFTAZIDIME/AVIBACTAM Value in next row Sensitive      <=4 SENSITIVEThis is a modified FDA-approved test that has been validated and its performance characteristics determined by the reporting laboratory.  This laboratory is certified under the Clinical Laboratory Improvement Amendments CLIA as qualified to perform high complexity clinical laboratory testing.    CEFTOLOZANE/TAZOBACTAM Value in next row Sensitive      <=4 SENSITIVEThis is a modified FDA-approved test that has been validated and its performance characteristics determined by the reporting laboratory.  This laboratory is certified under the Clinical Laboratory Improvement Amendments CLIA as qualified to perform high complexity clinical laboratory testing.    TOBRAMYCIN Value in next row Sensitive      <=4 SENSITIVEThis is a modified FDA-approved test that has been validated and its performance characteristics determined by the reporting  laboratory.  This laboratory is certified under the Clinical Laboratory Improvement Amendments CLIA as qualified to perform high complexity clinical laboratory testing.    CEFTAZIDIME Value in next row Sensitive      <=4 SENSITIVEThis is a modified FDA-approved test that has been validated and its performance characteristics determined by the reporting laboratory.  This laboratory is certified under the Clinical Laboratory Improvement Amendments CLIA as qualified to perform high complexity clinical laboratory  testing.    * >=100,000 COLONIES/mL PSEUDOMONAS AERUGINOSA  Blood Culture (routine x 2)     Status: None (Preliminary result)   Collection Time: 05/19/24  2:42 PM   Specimen: BLOOD  Result Value Ref Range Status   Specimen Description   Final    BLOOD BLOOD LEFT ARM Performed at [facility], [address] [place], [place] [address]    Special Requests   Final    BOTTLES DRAWN AEROBIC ONLY Blood Culture results may not be optimal due to an inadequate volume of blood received in culture bottles Performed at [facility], [address], [place], [place] [address]    Culture   Final    NO GROWTH 3 DAYS Performed at [facility], [address], [place], [place] [address]    Report Status PENDING  Incomplete  Blood Culture (routine x 2)     Status: None (Preliminary result)   Collection Time: 05/19/24  4:15 PM   Specimen: BLOOD  Result Value Ref Range Status   Specimen Description   Final    BLOOD LEFT ANTECUBITAL Performed at [facility], [address], [place], [place] [address]    Special Requests   Final    BOTTLES DRAWN AEROBIC AND ANAEROBIC Blood Culture adequate volume Performed at [facility], [address], [place], [place] [address]    Culture   Final    NO GROWTH 3 DAYS Performed at [facility], [address], [place], [place] [address]    Report Status PENDING  Incomplete      Medications:    sodium chloride    Intravenous Once   amiodarone   200 mg Per Tube Daily   aspirin   81 mg Per Tube QHS   Chlorhexidine  Gluconate Cloth  6 each Topical Daily   cyclobenzaprine   2.5 mg Per Tube Q12H   docusate  100 mg Per Tube BID   enoxaparin  (LOVENOX ) injection  45 mg Subcutaneous Q24H   famotidine   40 mg Per Tube Daily   folic acid   1 mg Per Tube Daily   free water   200 mL Per Tube Q8H   levETIRAcetam   1,000 mg Per Tube BID   liver oil-zinc  oxide   Topical BID   metoprolol  tartrate  25 mg Per Tube BID   multivitamin with minerals  1 tablet Per Tube Daily   simethicone   80 mg Per Tube TID   simvastatin   10 mg Per Tube QHS   sodium chloride  flush  3 mL Intravenous Q12H   valproic  acid  500 mg Per Tube TID with meals   And   valproic  acid  750 mg Per Tube QHS   Continuous Infusions:  lactated ringers  Stopped (05/21/24 0848)   piperacillin-tazobactam (ZOSYN)  IV 3.375 g (05/22/24 0541)      LOS: 2 days   Kathleen Jensen  [facility]  05/22/2024, 9:17 AM

## 2024-05-22 NOTE — Plan of Care (Signed)
  Problem: Coping: Goal: Level of anxiety will decrease Outcome: Progressing   Problem: Pain Managment: Goal: General experience of comfort will improve and/or be controlled Outcome: Progressing   Problem: Safety: Goal: Ability to remain free from injury will improve Outcome: Progressing   Problem: Skin Integrity: Goal: Risk for impaired skin integrity will decrease Outcome: Progressing

## 2024-05-23 DIAGNOSIS — N3000 Acute cystitis without hematuria: Secondary | ICD-10-CM | POA: Diagnosis not present

## 2024-05-23 DIAGNOSIS — K9420 Gastrostomy complication, unspecified: Secondary | ICD-10-CM | POA: Diagnosis not present

## 2024-05-23 LAB — TYPE AND SCREEN
ABO/RH(D): B POS
Antibody Screen: NEGATIVE
Unit division: 0
Unit division: 0

## 2024-05-23 LAB — BASIC METABOLIC PANEL WITH GFR
Anion gap: 8 (ref 5–15)
BUN: 20 mg/dL (ref 8–23)
CO2: 24 mmol/L (ref 22–32)
Calcium: 7.9 mg/dL — ABNORMAL LOW (ref 8.9–10.3)
Chloride: 114 mmol/L — ABNORMAL HIGH (ref 98–111)
Creatinine, Ser: 0.78 mg/dL (ref 0.44–1.00)
GFR, Estimated: >60 mL/min (ref >60)
Glucose, Bld: 106 mg/dL — ABNORMAL HIGH (ref 70–99)
Potassium: 3.6 mmol/L (ref 3.5–5.1)
Sodium: 147 mmol/L — ABNORMAL HIGH (ref 135–145)

## 2024-05-23 LAB — BPAM RBC
Blood Product Expiration Date: 202511192359
Blood Product Expiration Date: 202511252359
Unit Type and Rh: 7300
Unit Type and Rh: 7300

## 2024-05-23 MED ORDER — CIPROFLOXACIN HCL 500 MG PO TABS
500.0000 mg | ORAL_TABLET | Freq: Two times a day (BID) | ORAL | Status: DC
  Administered 2024-05-23 (×2): 500 mg via ORAL
  Filled 2024-05-23 (×2): qty 1

## 2024-05-23 MED ORDER — CEPHALEXIN 250 MG/5ML PO SUSR
500.0000 mg | Freq: Four times a day (QID) | ORAL | Status: DC
  Administered 2024-05-23 – 2024-05-31 (×33): 500 mg
  Filled 2024-05-23 (×36): qty 10

## 2024-05-23 MED ORDER — CEFADROXIL 500 MG/5ML PO SUSR
1000.0000 mg | Freq: Two times a day (BID) | ORAL | Status: DC
  Filled 2024-05-23: qty 10

## 2024-05-23 NOTE — Progress Notes (Addendum)
 [facility] PROGRESS NOTE    Progress Note  [name]  FMW:[id number] DOB: 1951-06-07 DOA: 05/19/2024 PCP: System, Provider Not In     Brief Narrative:   Kathleen Jensen is an 73 y.o. female nursing home resident with a history of CVA, bedbound with a chronic PEG, indwelling Foley catheter seizure disorder advanced dementia, recently discharged 2 months ago for endocarditis, felt to be not a surgical candidate so she was committed to lifelong suppressive antibiotic treatment PICC line was placed on 04/06/2024, now on oral cefadroxil since 05/07/2024.  Was sent from nursing home because of PEG tube leakage, CT scan of the abdomen pelvis showed jejunostomy tube in place without any obvious complication, adynamic ileus, was found to have a lactic acid of 2.8 hemoglobin is 6.3, repeated 12, no leukocytosis. Assessment/Plan:  Sepsis secondary cellulitis and Pseudomonas UTI: CT scan of the abdomen pelvis is unremarkable. Interventional radiology was consulted they recommended to apply Desitin to skin surface, tape to tube to prevent slippage.   Urine culture grew Pseudomonas, sensitive to Cipro changed to oral, will complete a 7-day course. Blood cultures negative till date. She will need to go home on suppressive antibiotic therapy for endocarditis.  Acute kidney injury: Likely prerenal azotemia, and hold diuretic therapy. Creatinine has returned to baseline.  Hypovolemic hypernatremia: Increase free water  per tube Sodium is coming down nicely continue current management.  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 Restart cefadroxil  CVA (cerebral vascular accident) (HCC) Continue aspirin .  History of seizure disorder: Continue Keppra  and valproic  acid.  Paroxysmal atrial fibrillation: Rate controlled continue amiodarone  and metoprolol .  Essential hypertension: She should probably go back to skilled nursing facility without  ARB.  GERD: Continue famotidine .   Sacral decubitus ulcer stage IV present on admission: Consult wound care. RN Pressure Injury Documentation: Wound 03/26/24 0725 Pressure Injury Coccyx Mid Deep Tissue Pressure Injury - Purple or maroon localized area of discolored intact skin or blood-filled blister due to damage of underlying soft tissue from pressure and/or shear. (Active)    Estimated body mass index is 30.02 kg/m as calculated from the following:   Height as of this encounter: 5' 10 (1.778 m).   Weight as of this encounter: 94.9 kg.  DVT prophylaxis: lovenox  Family Communication:none Status is: Observation The patient will require care spanning > 2 midnights and should be moved to inpatient because: Sepsis    Code Status:     Code Status Orders  (From admission, onward)           Start     Ordered   05/19/24 2255  Full code  Continuous       Question:  By:  Answer:  Consent: discussion documented in EHR   05/19/24 2256           Code Status History     Date Active Date Inactive Code Status Order ID Comments User Context   03/25/2024 1310 04/06/2024 2128 Full Code [id number]  Howell Lunger, DO ED   06/06/2023 1800 06/10/2023 0439 Full Code [id number]  Lou Claretta HERO, MD ED   03/01/2023 0229 03/06/2023 1747 Full Code [id number]  Lee Kingfisher, MD ED   11/16/2022 2216 12/03/2022 1824 DNR [id number]  Fredirick Glenys RAMAN, MD ED   11/16/2022 2042 11/16/2022 2216 DNR [id number]  Yolande Lamar BROCKS, MD ED   11/16/2022 1325 11/16/2022 2042 Full Code [id number]  Yolande Lamar BROCKS, MD ED   05/15/2022 1946 05/18/2022 2321 Full Code [id number]  Howell Lunger, DO  ED   02/15/2022 1947 02/22/2022 1859 Full Code [id number]  Shelah Lamar RAMAN, MD ED   05/29/2021 1500 06/07/2021 0431 Full Code [id number]  Waddell Rake, MD ED   04/12/2020 0114 04/14/2020 1847 Full Code [id number]  Lonzell Emeline HERO, DO ED   10/05/2017 1544 10/15/2017 1619 Full Code [id number]  Black, Darice HERO, NP ED         IV  Access:   Peripheral IV   Procedures and diagnostic studies:   No results found.    Medical Consultants:   None.   Subjective:    Hulda Trudy Collet nonverbal this morning.  Objective:    Vitals:   05/22/24 0602 05/22/24 2128 05/22/24 2136 05/23/24 0613  BP: 121/65 119/75  (!) 143/92  Pulse: 64 63 72 60  Resp: 16 15  15   Temp: 98.8 F (37.1 C) 98.2 F (36.8 C)  97.9 F (36.6 C)  TempSrc: Oral Oral    SpO2: 94% 97%  95%  Weight:      Height:       SpO2: 95 %   Intake/Output Summary (Last 24 hours) at 05/23/2024 0727 Last data filed at 05/23/2024 0355 Gross per 24 hour  Intake 943.5 ml  Output 750 ml  Net 193.5 ml   Filed Weights   05/20/24 1124  Weight: 94.9 kg    Exam: General exam: In no acute distress. Respiratory system: Good air movement and clear to auscultation. Cardiovascular system: S1 & S2 heard, RRR. No JVD. Gastrointestinal system: Abdomen is nondistended, soft and nontender, G-tube in place. Extremities: No pedal edema. Skin: No rashes, lesions or ulcers Psychiatry: Judgement and insight appear normal. Mood & affect appropriate.  Data Reviewed:    Labs: Basic Metabolic Panel: Recent Labs  Lab 05/19/24 1442 05/19/24 1455 05/20/24 1227 05/21/24 0339 05/22/24 0409 05/23/24 0350  NA 142 145 143 146* 148* 147*  K 4.4 4.3 4.2 3.8 3.7 3.6  CL 106 105 105 111 114* 114*  CO2 26  --  27 28 25 24   GLUCOSE 130* 132* 104* 107* 107* 106*  BUN 51* 49* 44* 38* 28* 20  CREATININE 0.91 1.10* 0.83 0.72 0.88 0.78  CALCIUM  8.5*  --  8.7* 8.1* 8.1* 7.9*   GFR Estimated Creatinine Clearance: 78.2 mL/min (by C-G formula based on SCr of 0.78 mg/dL). Liver Function Tests: Recent Labs  Lab 05/19/24 1442  AST 57*  ALT 12  ALKPHOS 214*  BILITOT 1.0  PROT 9.0*  ALBUMIN  2.1*   No results for input(s): LIPASE, AMYLASE in the last 168 hours. No results for input(s): AMMONIA in the last 168 hours. Coagulation profile Recent Labs   Lab 05/19/24 1442  INR 1.5*   COVID-19 Labs  No results for input(s): DDIMER, FERRITIN, LDH, CRP in the last 72 hours.  Lab Results  Component Value Date   SARSCOV2NAA NEGATIVE 11/16/2022   SARSCOV2NAA NEGATIVE 02/15/2022   SARSCOV2NAA NEGATIVE 04/11/2020    CBC: Recent Labs  Lab 05/19/24 1442 05/19/24 1455 05/19/24 2152 05/20/24 0551  WBC 13.3*  --  9.9 11.1*  NEUTROABS 6.6  --   --   --   HGB 6.6* 12.9 11.2* 10.9*  HCT 21.7* 38.0 36.1 34.1*  MCV 111.9*  --  107.8* 108.9*  PLT 171  --  140* 157   Cardiac Enzymes: No results for input(s): CKTOTAL, CKMB, CKMBINDEX, TROPONINI in the last 168 hours. BNP (last 3 results) No results for input(s): PROBNP in the last 8760 hours. CBG: No results for  input(s): GLUCAP in the last 168 hours. D-Dimer: No results for input(s): DDIMER in the last 72 hours. Hgb A1c: No results for input(s): HGBA1C in the last 72 hours. Lipid Profile: No results for input(s): CHOL, HDL, LDLCALC, TRIG, CHOLHDL, LDLDIRECT in the last 72 hours. Thyroid  function studies: No results for input(s): TSH, T4TOTAL, T3FREE, THYROIDAB in the last 72 hours.  Invalid input(s): FREET3 Anemia work up: No results for input(s): VITAMINB12, FOLATE, FERRITIN, TIBC, IRON, RETICCTPCT in the last 72 hours. Sepsis Labs: Recent Labs  Lab 05/19/24 1442 05/19/24 1454 05/19/24 1709 05/19/24 2152 05/20/24 0551 05/20/24 0552  WBC 13.3*  --   --  9.9 11.1*  --   LATICACIDVEN  --  2.9* 2.8*  --   --  1.9   Microbiology Recent Results (from the past 240 hours)  Urine Culture     Status: Abnormal   Collection Time: 05/19/24  2:22 PM   Specimen: Urine, Random  Result Value Ref Range Status   Specimen Description   Final    URINE, RANDOM Performed at [facility], [address], [place], [place] [address]    Special Requests   Final    NONE Reflexed from [id number] Performed at [facility], [address], [place], [place] [address]    Culture >=100,000 COLONIES/mL PSEUDOMONAS AERUGINOSA (A)  Final   Report Status 05/21/2024 FINAL  Final   Organism ID, Bacteria PSEUDOMONAS AERUGINOSA (A)  Final      Susceptibility   Pseudomonas aeruginosa - MIC*    MEROPENEM 0.5 SENSITIVE Sensitive     CIPROFLOXACIN 0.12 SENSITIVE Sensitive     IMIPENEM 2 SENSITIVE Sensitive     PIP/TAZO Value in next row Sensitive      <=4 SENSITIVEThis is a modified FDA-approved test that has been validated and its performance characteristics determined by the reporting laboratory.  This laboratory is certified under the Clinical Laboratory Improvement Amendments CLIA as qualified to perform high complexity clinical laboratory testing.    CEFEPIME  Value in next row Sensitive      <=4 SENSITIVEThis is a modified FDA-approved test that has been validated and its performance characteristics determined by the reporting laboratory.  This laboratory is certified under the Clinical Laboratory Improvement Amendments CLIA as qualified to perform high complexity clinical laboratory testing.    CEFTAZIDIME/AVIBACTAM Value in next row Sensitive      <=4 SENSITIVEThis is a modified FDA-approved test that has been validated and its performance characteristics determined by the reporting laboratory.  This laboratory is certified under the Clinical Laboratory Improvement Amendments CLIA as qualified to perform high complexity clinical laboratory testing.    CEFTOLOZANE/TAZOBACTAM Value in next row Sensitive      <=4 SENSITIVEThis is a modified FDA-approved test that has been validated and its performance characteristics determined by the reporting laboratory.  This laboratory is certified under the Clinical Laboratory Improvement Amendments CLIA as qualified to perform high complexity clinical laboratory testing.    TOBRAMYCIN Value in next row Sensitive      <=4 SENSITIVEThis is a modified FDA-approved test  that has been validated and its performance characteristics determined by the reporting laboratory.  This laboratory is certified under the Clinical Laboratory Improvement Amendments CLIA as qualified to perform high complexity clinical laboratory testing.    CEFTAZIDIME Value in next row Sensitive      <=4 SENSITIVEThis is a modified FDA-approved test that has been validated and its performance characteristics determined by the reporting laboratory.  This  laboratory is certified under the Clinical Laboratory Improvement Amendments CLIA as qualified to perform high complexity clinical laboratory testing.    * >=100,000 COLONIES/mL PSEUDOMONAS AERUGINOSA  Blood Culture (routine x 2)     Status: None (Preliminary result)   Collection Time: 05/19/24  2:42 PM   Specimen: BLOOD  Result Value Ref Range Status   Specimen Description   Final    BLOOD BLOOD LEFT ARM Performed at [facility], [address], [place], [place] [address]    Special Requests   Final    BOTTLES DRAWN AEROBIC ONLY Blood Culture results may not be optimal due to an inadequate volume of blood received in culture bottles Performed at [facility], [address], [place], [place] [address]    Culture   Final    NO GROWTH 3 DAYS Performed at [facility], [address] [place], [place] [address]    Report Status PENDING  Incomplete  Blood Culture (routine x 2)     Status: None (Preliminary result)   Collection Time: 05/19/24  4:15 PM   Specimen: BLOOD  Result Value Ref Range Status   Specimen Description   Final    BLOOD LEFT ANTECUBITAL Performed at [facility], [address] [place], [place] [address]    Special Requests   Final    BOTTLES DRAWN AEROBIC AND ANAEROBIC Blood Culture adequate volume Performed at [facility], [address] [place], [place] [address]    Culture   Final    NO GROWTH 3 DAYS Performed at [facility], [address], [place], [place] [address]    Report Status PENDING  Incomplete     Medications:    sodium chloride    Intravenous Once   amiodarone   200 mg Per Tube Daily   aspirin   81 mg Per Tube QHS   Chlorhexidine  Gluconate Cloth  6 each Topical Daily   cyclobenzaprine   2.5 mg Per Tube Q12H   docusate  100 mg Per Tube BID   enoxaparin  (LOVENOX ) injection  40 mg Subcutaneous Q24H   famotidine   40 mg Per Tube Daily   folic acid   1 mg Per Tube Daily   free water   200 mL Per Tube Q4H   levETIRAcetam   1,000 mg Per Tube BID   liver oil-zinc  oxide   Topical BID   metoprolol  tartrate  25 mg Per Tube BID   multivitamin with minerals  1 tablet Per Tube Daily   simethicone   80 mg Per Tube TID   simvastatin   10 mg Per Tube QHS   sodium chloride  flush  3 mL Intravenous Q12H   valproic  acid  500 mg Per Tube TID with meals   And   valproic  acid  750 mg Per Tube QHS   Continuous Infusions:  lactated ringers  Stopped (05/21/24 0848)   piperacillin-tazobactam (ZOSYN)  IV 3.375 g (05/23/24 0537)      LOS: 3 days   Erle Odell Castor  [facility]  05/23/2024, 7:27 AM

## 2024-05-24 DIAGNOSIS — G8191 Hemiplegia, unspecified affecting right dominant side: Secondary | ICD-10-CM | POA: Diagnosis not present

## 2024-05-24 DIAGNOSIS — K9420 Gastrostomy complication, unspecified: Secondary | ICD-10-CM | POA: Diagnosis not present

## 2024-05-24 DIAGNOSIS — Z515 Encounter for palliative care: Secondary | ICD-10-CM | POA: Diagnosis not present

## 2024-05-24 DIAGNOSIS — I639 Cerebral infarction, unspecified: Secondary | ICD-10-CM | POA: Diagnosis not present

## 2024-05-24 DIAGNOSIS — Z7189 Other specified counseling: Secondary | ICD-10-CM | POA: Diagnosis not present

## 2024-05-24 DIAGNOSIS — N3000 Acute cystitis without hematuria: Secondary | ICD-10-CM | POA: Diagnosis not present

## 2024-05-24 LAB — BASIC METABOLIC PANEL WITH GFR
Anion gap: 8 (ref 5–15)
BUN: 13 mg/dL (ref 8–23)
CO2: 25 mmol/L (ref 22–32)
Calcium: 7.9 mg/dL — ABNORMAL LOW (ref 8.9–10.3)
Chloride: 113 mmol/L — ABNORMAL HIGH (ref 98–111)
Creatinine, Ser: 0.58 mg/dL (ref 0.44–1.00)
GFR, Estimated: >60 mL/min (ref >60)
Glucose, Bld: 92 mg/dL (ref 70–99)
Potassium: 3.1 mmol/L — ABNORMAL LOW (ref 3.5–5.1)
Sodium: 145 mmol/L (ref 135–145)

## 2024-05-24 LAB — CULTURE, BLOOD (ROUTINE X 2)
Culture: NO GROWTH
Culture: NO GROWTH
Special Requests: ADEQUATE

## 2024-05-24 MED ORDER — POTASSIUM CHLORIDE 20 MEQ PO PACK
40.0000 meq | PACK | Freq: Two times a day (BID) | ORAL | Status: AC
  Administered 2024-05-24 (×2): 40 meq via ORAL
  Filled 2024-05-24 (×2): qty 2

## 2024-05-24 MED ORDER — CIPROFLOXACIN HCL 500 MG PO TABS
500.0000 mg | ORAL_TABLET | Freq: Two times a day (BID) | ORAL | Status: AC
Start: 2024-05-24 — End: 2024-05-27
  Administered 2024-05-24 – 2024-05-27 (×8): 500 mg
  Filled 2024-05-24 (×8): qty 1

## 2024-05-24 NOTE — Plan of Care (Signed)
   Problem: Coping: Goal: Level of anxiety will decrease Outcome: Progressing   Problem: Pain Managment: Goal: General experience of comfort will improve and/or be controlled Outcome: Progressing   Problem: Safety: Goal: Ability to remain free from injury will improve Outcome: Progressing

## 2024-05-24 NOTE — Progress Notes (Signed)
 [facility] PROGRESS NOTE    Progress Note  Kathleen Jensen  FMW:[id number] DOB: 1951-02-09 DOA: 05/19/2024 PCP: System, Provider Not In     Brief Narrative:   Kathleen Jensen is an 73 y.o. female nursing home resident with a history of CVA, bedbound with a chronic PEG, indwelling Foley catheter seizure disorder advanced dementia, recently discharged 2 months ago for endocarditis, felt to be not a surgical candidate so she was committed to lifelong suppressive antibiotic treatment PICC line was placed on 04/06/2024, now on oral cefadroxil since 05/07/2024.  Was sent from nursing home because of PEG tube leakage, CT scan of the abdomen pelvis showed jejunostomy tube in place without any obvious complication, adynamic ileus, was found to have a lactic acid of 2.8 hemoglobin is 6.3, repeated 12, no leukocytosis. Assessment/Plan:  Sepsis secondary cellulitis and Pseudomonas UTI: CT scan of the abdomen pelvis is unremarkable. Interventional radiology was consulted they recommended to apply Desitin to skin surface, tape to tube to prevent slippage.   Urine culture grew Pseudomonas, sensitive to Cipro changed to oral, will complete a 7-day course. Blood cultures negative till date. She will need to go home on suppressive antibiotic therapy for endocarditis. Patient is medically stable to be discharged to skilled nursing facility.  Acute kidney injury: Likely prerenal azotemia, and hold diuretic therapy. Creatinine has returned to baseline.  Hypovolemic hypernatremia/hypokalemia: Increase free water  per tube Sodium is coming down nicely continue current management. Potassium is low replete orally.  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 Restart cefadroxil  CVA (cerebral vascular accident) (HCC) Continue aspirin .  History of seizure disorder: Continue Keppra  and valproic  acid.  Paroxysmal atrial fibrillation: Rate controlled continue amiodarone  and  metoprolol .  Essential hypertension: She should probably go back to skilled nursing facility without ARB.  GERD: Continue famotidine .   Sacral decubitus ulcer stage IV present on admission: Consult wound care. RN Pressure Injury Documentation: Wound 03/26/24 0725 Pressure Injury Coccyx Mid Deep Tissue Pressure Injury - Purple or maroon localized area of discolored intact skin or blood-filled blister due to damage of underlying soft tissue from pressure and/or shear. (Active)     Wound 05/23/24 2200 Pressure Injury Coccyx Mid Unstageable - Full thickness tissue loss in which the base of the injury is covered by slough (yellow, tan, gray, green or brown) and/or eschar (tan, brown or black) in the wound bed. (Active)    Estimated body mass index is 30.02 kg/m as calculated from the following:   Height as of this encounter: 5' 10 (1.778 m).   Weight as of this encounter: 94.9 kg.  DVT prophylaxis: lovenox  Family Communication:none Status is: Observation The patient will require care spanning > 2 midnights and should be moved to inpatient because: Sepsis    Code Status:     Code Status Orders  (From admission, onward)           Start     Ordered   05/19/24 2255  Full code  Continuous       Question:  By:  Answer:  Consent: discussion documented in EHR   05/19/24 2256           Code Status History     Date Active Date Inactive Code Status Order ID Comments User Context   03/25/2024 1310 04/06/2024 2128 Full Code [id number]  Howell Lunger, DO ED   06/06/2023 1800 06/10/2023 0439 Full Code [id number]  Lou Claretta HERO, MD ED   03/01/2023 0229 03/06/2023 1747 Full Code [id number]  Sundil, Subrina, MD ED   11/16/2022 2216 12/03/2022 1824 DNR [id number]  Fredirick Glenys RAMAN, MD ED   11/16/2022 2042 11/16/2022 2216 DNR [id number]  Yolande Lamar BROCKS, MD ED   11/16/2022 1325 11/16/2022 2042 Full Code [id number]  Yolande Lamar BROCKS, MD ED   05/15/2022 1946 05/18/2022 2321 Full Code [id number]   Howell Lunger, DO ED   02/15/2022 1947 02/22/2022 1859 Full Code [id number]  Shelah Lamar RAMAN, MD ED   05/29/2021 1500 06/07/2021 0431 Full Code [id number]  Waddell Rake, MD ED   04/12/2020 0114 04/14/2020 1847 Full Code [id number]  Lonzell Emeline HERO, DO ED   10/05/2017 1544 10/15/2017 1619 Full Code [id number]  Black, Darice HERO, NP ED         IV Access:   Peripheral IV   Procedures and diagnostic studies:   No results found.    Medical Consultants:   None.   Subjective:    Hulda Trudy Collet sleeping this morning moaning and groaning Objective:    Vitals:   05/23/24 0613 05/23/24 1449 05/23/24 2126 05/24/24 0538  BP: (!) 143/92 (!) 156/91 (!) 156/85 (!) 146/85  Pulse: 60 69 61 60  Resp: 15 15 19 19   Temp: 97.9 F (36.6 C) 98.2 F (36.8 C) 98.4 F (36.9 C) 97.7 F (36.5 C)  TempSrc:  Axillary Oral Oral  SpO2: 95% 98% 98% 99%  Weight:      Height:       SpO2: 99 %   Intake/Output Summary (Last 24 hours) at 05/24/2024 0829 Last data filed at 05/24/2024 0558 Gross per 24 hour  Intake 1040 ml  Output 800 ml  Net 240 ml   Filed Weights   05/20/24 1124  Weight: 94.9 kg    Exam: General exam: In no acute distress. Respiratory system: Good air movement and clear to auscultation. Cardiovascular system: S1 & S2 heard, RRR. No JVD. Gastrointestinal system: Abdomen is nondistended, soft and nontender.  Extremities: No pedal edema. Skin: No rashes, lesions or ulcers Psychiatry: Judgement and insight appear normal. Mood & affect appropriate.  Data Reviewed:    Labs: Basic Metabolic Panel: Recent Labs  Lab 05/20/24 1227 05/21/24 0339 05/22/24 0409 05/23/24 0350 05/24/24 0312  NA 143 146* 148* 147* 145  K 4.2 3.8 3.7 3.6 3.1*  CL 105 111 114* 114* 113*  CO2 27 28 25 24 25   GLUCOSE 104* 107* 107* 106* 92  BUN 44* 38* 28* 20 13  CREATININE 0.83 0.72 0.88 0.78 0.58  CALCIUM  8.7* 8.1* 8.1* 7.9* 7.9*   GFR Estimated Creatinine Clearance: 78.2 mL/min  (by C-G formula based on SCr of 0.58 mg/dL). Liver Function Tests: Recent Labs  Lab 05/19/24 1442  AST 57*  ALT 12  ALKPHOS 214*  BILITOT 1.0  PROT 9.0*  ALBUMIN  2.1*   No results for input(s): LIPASE, AMYLASE in the last 168 hours. No results for input(s): AMMONIA in the last 168 hours. Coagulation profile Recent Labs  Lab 05/19/24 1442  INR 1.5*   COVID-19 Labs  No results for input(s): DDIMER, FERRITIN, LDH, CRP in the last 72 hours.  Lab Results  Component Value Date   SARSCOV2NAA NEGATIVE 11/16/2022   SARSCOV2NAA NEGATIVE 02/15/2022   SARSCOV2NAA NEGATIVE 04/11/2020    CBC: Recent Labs  Lab 05/19/24 1442 05/19/24 1455 05/19/24 2152 05/20/24 0551  WBC 13.3*  --  9.9 11.1*  NEUTROABS 6.6  --   --   --   HGB 6.6* 12.9 11.2* 10.9*  HCT 21.7* 38.0 36.1  34.1*  MCV 111.9*  --  107.8* 108.9*  PLT 171  --  140* 157   Cardiac Enzymes: No results for input(s): CKTOTAL, CKMB, CKMBINDEX, TROPONINI in the last 168 hours. BNP (last 3 results) No results for input(s): PROBNP in the last 8760 hours. CBG: No results for input(s): GLUCAP in the last 168 hours. D-Dimer: No results for input(s): DDIMER in the last 72 hours. Hgb A1c: No results for input(s): HGBA1C in the last 72 hours. Lipid Profile: No results for input(s): CHOL, HDL, LDLCALC, TRIG, CHOLHDL, LDLDIRECT in the last 72 hours. Thyroid  function studies: No results for input(s): TSH, T4TOTAL, T3FREE, THYROIDAB in the last 72 hours.  Invalid input(s): FREET3 Anemia work up: No results for input(s): VITAMINB12, FOLATE, FERRITIN, TIBC, IRON, RETICCTPCT in the last 72 hours. Sepsis Labs: Recent Labs  Lab 05/19/24 1442 05/19/24 1454 05/19/24 1709 05/19/24 2152 05/20/24 0551 05/20/24 0552  WBC 13.3*  --   --  9.9 11.1*  --   LATICACIDVEN  --  2.9* 2.8*  --   --  1.9   Microbiology Recent Results (from the past 240 hours)  Urine Culture      Status: Abnormal   Collection Time: 05/19/24  2:22 PM   Specimen: Urine, Random  Result Value Ref Range Status   Specimen Description   Final    URINE, RANDOM Performed at [facility], [address], [place], [place] [address]    Special Requests   Final    NONE Reflexed from [id number] Performed at [facility], [address] [place], [place] [address]    Culture >=100,000 COLONIES/mL PSEUDOMONAS AERUGINOSA (A)  Final   Report Status 05/21/2024 FINAL  Final   Organism ID, Bacteria PSEUDOMONAS AERUGINOSA (A)  Final      Susceptibility   Pseudomonas aeruginosa - MIC*    MEROPENEM 0.5 SENSITIVE Sensitive     CIPROFLOXACIN 0.12 SENSITIVE Sensitive     IMIPENEM 2 SENSITIVE Sensitive     PIP/TAZO Value in next row Sensitive      <=4 SENSITIVEThis is a modified FDA-approved test that has been validated and its performance characteristics determined by the reporting laboratory.  This laboratory is certified under the Clinical Laboratory Improvement Amendments CLIA as qualified to perform high complexity clinical laboratory testing.    CEFEPIME  Value in next row Sensitive      <=4 SENSITIVEThis is a modified FDA-approved test that has been validated and its performance characteristics determined by the reporting laboratory.  This laboratory is certified under the Clinical Laboratory Improvement Amendments CLIA as qualified to perform high complexity clinical laboratory testing.    CEFTAZIDIME/AVIBACTAM Value in next row Sensitive      <=4 SENSITIVEThis is a modified FDA-approved test that has been validated and its performance characteristics determined by the reporting laboratory.  This laboratory is certified under the Clinical Laboratory Improvement Amendments CLIA as qualified to perform high complexity clinical laboratory testing.    CEFTOLOZANE/TAZOBACTAM Value in next row Sensitive      <=4 SENSITIVEThis is a modified FDA-approved test that has been  validated and its performance characteristics determined by the reporting laboratory.  This laboratory is certified under the Clinical Laboratory Improvement Amendments CLIA as qualified to perform high complexity clinical laboratory testing.    TOBRAMYCIN Value in next row Sensitive      <=4 SENSITIVEThis is a modified FDA-approved test that has been validated and its performance characteristics determined by the reporting laboratory.  This laboratory is certified  under the Clinical Laboratory Improvement Amendments CLIA as qualified to perform high complexity clinical laboratory testing.    CEFTAZIDIME Value in next row Sensitive      <=4 SENSITIVEThis is a modified FDA-approved test that has been validated and its performance characteristics determined by the reporting laboratory.  This laboratory is certified under the Clinical Laboratory Improvement Amendments CLIA as qualified to perform high complexity clinical laboratory testing.    * >=100,000 COLONIES/mL PSEUDOMONAS AERUGINOSA  Blood Culture (routine x 2)     Status: None (Preliminary result)   Collection Time: 05/19/24  2:42 PM   Specimen: BLOOD  Result Value Ref Range Status   Specimen Description   Final    BLOOD BLOOD LEFT ARM Performed at [facility], [address], [place], [place] [address]    Special Requests   Final    BOTTLES DRAWN AEROBIC ONLY Blood Culture results may not be optimal due to an inadequate volume of blood received in culture bottles Performed at [facility], [address], [place], [place] [address]    Culture   Final    NO GROWTH 4 DAYS Performed at [facility], [address], [place], [place] [address]    Report Status PENDING  Incomplete  Blood Culture (routine x 2)     Status: None (Preliminary result)   Collection Time: 05/19/24  4:15 PM   Specimen: BLOOD  Result Value Ref Range Status   Specimen Description   Final    BLOOD LEFT  ANTECUBITAL Performed at [facility], [address] [place], [place] [address]    Special Requests   Final    BOTTLES DRAWN AEROBIC AND ANAEROBIC Blood Culture adequate volume Performed at [facility], [address] [place], [place] [address]    Culture   Final    NO GROWTH 4 DAYS Performed at [facility], [address], [place], [place] [address]    Report Status PENDING  Incomplete     Medications:    sodium chloride    Intravenous Once   amiodarone   200 mg Per Tube Daily   aspirin   81 mg Per Tube QHS   cephALEXin  500 mg Per Tube Q6H   Chlorhexidine  Gluconate Cloth  6 each Topical Daily   ciprofloxacin  500 mg Per Tube BID   cyclobenzaprine   2.5 mg Per Tube Q12H   docusate  100 mg Per Tube BID   enoxaparin  (LOVENOX ) injection  40 mg Subcutaneous Q24H   famotidine   40 mg Per Tube Daily   folic acid   1 mg Per Tube Daily   free water   200 mL Per Tube Q4H   levETIRAcetam   1,000 mg Per Tube BID   liver oil-zinc  oxide   Topical BID   metoprolol  tartrate  25 mg Per Tube BID   multivitamin with minerals  1 tablet Per Tube Daily   simethicone   80 mg Per Tube TID   simvastatin   10 mg Per Tube QHS   sodium chloride  flush  3 mL Intravenous Q12H   valproic  acid  500 mg Per Tube TID with meals   And   valproic  acid  750 mg Per Tube QHS   Continuous Infusions:      LOS: 4 days   Erle Odell Castor  [facility]  05/24/2024, 8:29 AM

## 2024-05-24 NOTE — TOC Progression Note (Signed)
 Transition of Care [facility]) - Progression Note    Patient Details  Name: Kathleen Jensen MRN: [id number] Date of Birth: 25-Mar-1951  Transition [facility] Care [facility]) CM/SW Contact  Alfonse JONELLE Rex, RN Phone Number: 05/24/2024, 9:49 AM  Clinical Narrative:   Call received from [name] Search, Legal Guardian with [facility] DSS, updated on patient's medical status and dc plan-return to [facility] LTC with possible Outpatient Palliative Services.  Teams chat sent to attending, confirmed anticipate dc tomorrow with Home Palliative Services. NCM called to [place] to update, no answer, vm left with NCM name and phone number for  contact.     Expected Discharge Plan: Long Term Nursing Home (LTC at [facility]) Barriers to Discharge: Continued Medical Work up               Expected Discharge Plan and Services       Living arrangements for the past 2 months: Skilled Nursing Facility [facility] LTC)                                       Social Drivers of Health (SDOH) Interventions SDOH Screenings   Food Insecurity: Patient Unable To Answer (05/20/2024)  Housing: Patient Unable To Answer (05/20/2024)  Transportation Needs: Patient Unable To Answer (03/25/2024)  Utilities: Patient Unable To Answer (03/25/2024)  Depression (PHQ2-9): Low Risk  (05/22/2019)  Social Connections: Unknown (03/25/2024)  Tobacco Use: Medium Risk (05/19/2024)    Readmission Risk Interventions    05/21/2024    1:03 PM 02/18/2022    2:23 PM  Readmission Risk Prevention Plan  Transportation Screening Complete Complete  PCP or Specialist Appt within 5-7 Days  Complete  Home Care Screening  Complete  Medication Review (RN CM)  Complete  Medication Review (RN Care Manager) Complete   PCP or Specialist appointment within 3-5 days of discharge Complete   HRI or Home Care Consult Complete   SW Recovery Care/Counseling Consult Complete   Palliative Care Screening Complete   Skilled Nursing Facility  Not Applicable

## 2024-05-24 NOTE — Progress Notes (Signed)
 Daily Progress Note   Patient Name: Kathleen Jensen       Date: 05/24/2024 DOB: Dec 05, 1950  Age: 73 y.o. MRN#: [id number] Attending Physician: Kathleen Celinda Balo, MD Primary Care Physician: System, Provider Not In Admit Date: 05/19/2024 Length of Stay: 4 days  Reason for Consultation/Follow-up: Establishing goals of care  Subjective:   Reviewed EMR including recent documentation from hospitalist and TOC.  Plan is for patient to discharge back to long-term care facility with palliative medicine support, potentially tomorrow.  Able to call and speak with patient's guardian, Kathleen Jensen.  Introduced myself as a member of the palliative medicine team.  [name] agrees with plan for patient to return to her facility for long-term care with palliative support.  Noted had met patient's daughter on Friday when she was visiting at bedside. [name] notes she tries to keep the family updated and DSS is the medical decision maker for the patient. Agreed with current plan at this time while also noting that hospitalist and this palliative medicine provider would recommend appropriately changing patient's CODE STATUS to DNR/DNI. [name] noted she needed paperwork submitted by the providers at the hospital to provide to her manager recommending change of CODE STATUS to DNR/DNI.  Noted would discuss with hospitalist and TOC to determine if they know the specific paperwork she needs.  All questions answered at that time.  No bowel medicine team and continue to follow with patient's medical journey.  Discussed with hospitalist and TOC after visit.  TOC unsure of paperwork that would be required by DSS.  Recommended considering MOST form.  Encouraged hospitalist to follow-up regarding paperwork.  Objective:   Vital Signs:  BP (!) 146/85 (BP Location: Left Arm)   Pulse 60   Temp 97.7 F (36.5 C) (Oral)   Resp 19   Ht 5' 10 (1.778 m)   Wt 94.9 kg   SpO2 99%   BMI 30.02 kg/m    Physical Exam: General: NAD, awake, mumbling incoherently at times Cardiovascular: RRR Respiratory: no increased work of breathing noted, not in respiratory distress Abdomen: not distended Skin: Reviewed wound care imaging Neuro: awake, not following commands, mumbling though incoherently  Assessment & Plan:   Assessment: Patient is a 73 year old female who is a long-term nursing home resident with a history of CVA, bedbound status, chronic PEG placement, chronic indwelling Foley catheter, seizure disorder, dementia, and endocarditis on lifelong suppressive antibiotics since not felt to be a good surgical candidate who was admitted on 05/19/2024 from nursing home due to PEG tube leakage. During hospitalization, patient has received management for sepsis secondary to infected jejunostomy tube/cellulitis, AKI, and wound care for sacral decubitus stage IV ulcer present on admission. Palliative medicine team consulted to assist with complex medical decision making.   Recommendations/Plan: # Complex medical decision making/goals of care:       - Patient unable to participate in complex medical decision making due to underlying medical status.                - Discussed care with patient's guardian, Kathleen Jensen, as detailed above in HPI.  Guardian requesting paperwork to submit to manager in order to change patient's CODE STATUS.  Discussed with TOC and hospitalist and attempts to determine paperwork needed in support of changing CODE STATUS to DNR/DNI.  Palliative medicine team continuing to engage in conversations as able and appropriate moving forward.                -  Code Status: Full Code                                 # Psycho-social/Spiritual Support:  - Support System: Legal Guardian- Kathleen Jensen; Family including daughters listed in chart    # Discharge Planning:  Return to long term care nursing home with palliative services   Discussed with: hospitalist, RN. TOC, patient's  guardian   Thank you for allowing the palliative care team to participate in the care Kathleen Jensen.  Tinnie Radar, DO Palliative Care Provider PMT # [phone]  If patient remains symptomatic despite maximum doses, please call PMT at [phone] between 0700 and 1900. Outside of these hours, please call attending, as PMT does not have night coverage.  Personally spent 35 minutes in patient care including extensive chart review (labs, imaging, progress/consult notes, vital signs), medically appropraite exam, discussed with treatment team, education to patient, family, and staff, documenting clinical information, medication review and management, coordination of care, and available advanced directive documents.

## 2024-05-24 NOTE — Plan of Care (Signed)
  Problem: Education: Goal: Knowledge of General Education information will improve Description: Including pain rating scale, medication(s)/side effects and non-pharmacologic comfort measures Outcome: Progressing   Problem: Health Behavior/Discharge Planning: Goal: Ability to manage health-related needs will improve Outcome: Progressing   Problem: Clinical Measurements: Goal: Ability to maintain clinical measurements within normal limits will improve Outcome: Progressing   Problem: Activity: Goal: Risk for activity intolerance will decrease Outcome: Progressing   Problem: Nutrition: Goal: Adequate nutrition will be maintained Outcome: Progressing   Problem: Pain Managment: Goal: General experience of comfort will improve and/or be controlled Outcome: Progressing   Problem: Safety: Goal: Ability to remain free from injury will improve Outcome: Progressing   Problem: Skin Integrity: Goal: Risk for impaired skin integrity will decrease Outcome: Progressing

## 2024-05-25 DIAGNOSIS — I639 Cerebral infarction, unspecified: Secondary | ICD-10-CM | POA: Diagnosis not present

## 2024-05-25 DIAGNOSIS — K9420 Gastrostomy complication, unspecified: Secondary | ICD-10-CM | POA: Diagnosis not present

## 2024-05-25 DIAGNOSIS — G8191 Hemiplegia, unspecified affecting right dominant side: Secondary | ICD-10-CM | POA: Diagnosis not present

## 2024-05-25 DIAGNOSIS — Z515 Encounter for palliative care: Secondary | ICD-10-CM

## 2024-05-25 DIAGNOSIS — Z7189 Other specified counseling: Secondary | ICD-10-CM

## 2024-05-25 DIAGNOSIS — Z7401 Bed confinement status: Secondary | ICD-10-CM

## 2024-05-25 LAB — BASIC METABOLIC PANEL WITH GFR
Anion gap: 7 (ref 5–15)
BUN: 13 mg/dL (ref 8–23)
CO2: 21 mmol/L — ABNORMAL LOW (ref 22–32)
Calcium: 7.9 mg/dL — ABNORMAL LOW (ref 8.9–10.3)
Chloride: 115 mmol/L — ABNORMAL HIGH (ref 98–111)
Creatinine, Ser: 0.72 mg/dL (ref 0.44–1.00)
GFR, Estimated: >60 mL/min (ref >60)
Glucose, Bld: 106 mg/dL — ABNORMAL HIGH (ref 70–99)
Potassium: 4.5 mmol/L (ref 3.5–5.1)
Sodium: 143 mmol/L (ref 135–145)

## 2024-05-25 LAB — GLUCOSE, CAPILLARY
Glucose-Capillary: 67 mg/dL — ABNORMAL LOW (ref 70–99)
Glucose-Capillary: 94 mg/dL (ref 70–99)
Glucose-Capillary: 95 mg/dL (ref 70–99)

## 2024-05-25 LAB — MAGNESIUM: Magnesium: 2.6 mg/dL — ABNORMAL HIGH (ref 1.7–2.4)

## 2024-05-25 LAB — PHOSPHORUS: Phosphorus: 2.2 mg/dL — ABNORMAL LOW (ref 2.5–4.6)

## 2024-05-25 MED ORDER — THIAMINE MONONITRATE 100 MG PO TABS
100.0000 mg | ORAL_TABLET | Freq: Every day | ORAL | Status: AC
Start: 2024-05-25 — End: 2024-05-29
  Administered 2024-05-25 – 2024-05-29 (×5): 100 mg
  Filled 2024-05-25 (×5): qty 1

## 2024-05-25 MED ORDER — DEXTROSE 50 % IV SOLN
INTRAVENOUS | Status: AC
  Administered 2024-05-25: 25 mL
  Filled 2024-05-25: qty 50

## 2024-05-25 MED ORDER — OSMOLITE 1.2 CAL PO LIQD
1000.0000 mL | ORAL | Status: DC
  Administered 2024-05-25 – 2024-05-30 (×5): 1000 mL
  Filled 2024-05-25 (×11): qty 1000

## 2024-05-25 MED ORDER — LACTATED RINGERS IV SOLN: INTRAVENOUS | Status: AC

## 2024-05-25 MED ORDER — PROSOURCE TF20 ENFIT COMPATIBL EN LIQD
60.0000 mL | Freq: Every day | ENTERAL | Status: DC
  Administered 2024-05-25 – 2024-05-31 (×7): 60 mL
  Filled 2024-05-25 (×8): qty 60

## 2024-05-25 NOTE — Plan of Care (Signed)
  Problem: Elimination: Goal: Will not experience complications related to urinary retention Outcome: Progressing   Problem: Pain Managment: Goal: General experience of comfort will improve and/or be controlled Outcome: Progressing   Problem: Safety: Goal: Ability to remain free from injury will improve Outcome: Progressing   Problem: Skin Integrity: Goal: Risk for impaired skin integrity will decrease Outcome: Progressing

## 2024-05-25 NOTE — TOC Progression Note (Signed)
 Transition of Care [facility]) - Progression Note    Patient Details  Name: Kathleen Jensen MRN: [id number] Date of Birth: 03/05/1951  Transition of Care [facility]) CM/SW Contact  Alfonse JONELLE Rex, RN Phone Number: 05/25/2024, 4:13 PM  Clinical Narrative:   Per Palliative Medicine Team-, plan is for continued aggressive care and to pursue discharge back to long-term care facility with recommendation for palliative care to follow as an outpatient.  TOC will continue to follow.     Expected Discharge Plan: Long Term Nursing Home (LTC at [facility]) Barriers to Discharge: Continued Medical Work up               Expected Discharge Plan and Services       Living arrangements for the past 2 months: Skilled Nursing Facility [facility])                                       Social Drivers of Health (SDOH) Interventions SDOH Screenings   Food Insecurity: Patient Unable To Answer (05/20/2024)  Housing: Patient Unable To Answer (05/20/2024)  Transportation Needs: Patient Unable To Answer (03/25/2024)  Utilities: Patient Unable To Answer (03/25/2024)  Depression (PHQ2-9): Low Risk  (05/22/2019)  Social Connections: Unknown (03/25/2024)  Tobacco Use: Medium Risk (05/19/2024)    Readmission Risk Interventions    05/21/2024    1:03 PM 02/18/2022    2:23 PM  Readmission Risk Prevention Plan  Transportation Screening Complete Complete  PCP or Specialist Appt within 5-7 Days  Complete  Home Care Screening  Complete  Medication Review (RN CM)  Complete  Medication Review (RN Care Manager) Complete   PCP or Specialist appointment within 3-5 days of discharge Complete   HRI or Home Care Consult Complete   SW Recovery Care/Counseling Consult Complete   Palliative Care Screening Complete   Skilled Nursing Facility Not Applicable

## 2024-05-25 NOTE — Progress Notes (Signed)
 [facility] PROGRESS NOTE    Progress Note  Timia Casselman  FMW:[id number] DOB: 1950/09/21 DOA: 05/19/2024 PCP: System, Provider Not In     Brief Narrative:   Kathleen Jensen is an 73 y.o. female nursing home resident with a history of CVA, bedbound with a chronic PEG, indwelling Foley catheter seizure disorder advanced dementia, recently discharged 2 months ago for endocarditis, felt to be not a surgical candidate so she was committed to lifelong suppressive antibiotic treatment PICC line was placed on 04/06/2024, now on oral cefadroxil since 05/07/2024.  Was sent from nursing home because of PEG tube leakage, CT scan of the abdomen pelvis showed jejunostomy tube in place without any obvious complication, adynamic ileus, was found to have a lactic acid of 2.8 hemoglobin is 6.3, repeated 12, no leukocytosis. Assessment/Plan:  Sepsis secondary cellulitis and Pseudomonas UTI: CT scan of the abdomen pelvis is unremarkable. Interventional radiology was consulted they recommended to apply Desitin to skin surface, tape to tube to prevent slippage.  Okay to use feeding tube. Urine culture grew Pseudomonas, sensitive to Cipro. She will complete a 7 days course in house. Blood cultures negative till date. She will need to go home on suppressive antibiotic therapy for endocarditis. Patient is medically stable to be discharged to skilled nursing facility.  Acute kidney injury: Likely prerenal azotemia, and hold diuretic therapy. Creatinine has returned to baseline.  Hypovolemic hypernatremia/hypokalemia: Increase free water  per tube Sodium is coming down nicely continue current management. Potassium is low replete orally.  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 Restart cefadroxil  CVA (cerebral vascular accident) (HCC) Continue aspirin .  History of seizure disorder: Continue Keppra  and valproic  acid.  Paroxysmal atrial fibrillation: Rate controlled  continue amiodarone  and metoprolol .  Essential hypertension: She should probably go back to skilled nursing facility without ARB.  GERD: Continue famotidine .  Nutrition: Consult nutrition for enteral feeds. Continue free water  per tube  Sacral decubitus ulcer stage IV present on admission: Consult wound care. RN Pressure Injury Documentation: Wound 03/26/24 0725 Pressure Injury Coccyx Mid Deep Tissue Pressure Injury - Purple or maroon localized area of discolored intact skin or blood-filled blister due to damage of underlying soft tissue from pressure and/or shear. (Active)     Wound 05/23/24 2200 Pressure Injury Coccyx Mid Unstageable - Full thickness tissue loss in which the base of the injury is covered by slough (yellow, tan, gray, green or brown) and/or eschar (tan, brown or black) in the wound bed. (Active)    Estimated body mass index is 30.02 kg/m as calculated from the following:   Height as of this encounter: 5' 10 (1.778 m).   Weight as of this encounter: 94.9 kg.  DVT prophylaxis: lovenox  Family Communication:none Status is: Observation The patient will require care spanning > 2 midnights and should be moved to inpatient because: Sepsis    Code Status:     Code Status Orders  (From admission, onward)           Start     Ordered   05/19/24 2255  Full code  Continuous       Question:  By:  Answer:  Consent: discussion documented in EHR   05/19/24 2256           Code Status History     Date Active Date Inactive Code Status Order ID Comments User Context   03/25/2024 1310 04/06/2024 2128 Full Code [id number]  Howell Lunger, DO ED   06/06/2023 1800 06/10/2023 0439 Full Code  [id number]  Lou Claretta HERO, MD ED   03/01/2023 0229 03/06/2023 1747 Full Code [id number]  Lee Kingfisher, MD ED   11/16/2022 2216 12/03/2022 1824 DNR [id number]  Fredirick Glenys RAMAN, MD ED   11/16/2022 2042 11/16/2022 2216 DNR [id number]  Yolande Lamar BROCKS, MD ED   11/16/2022 1325 11/16/2022  2042 Full Code [id number]  Yolande Lamar BROCKS, MD ED   05/15/2022 1946 05/18/2022 2321 Full Code [id number]  Howell Lunger, DO ED   02/15/2022 1947 02/22/2022 1859 Full Code [id number]  Shelah Lamar RAMAN, MD ED   05/29/2021 1500 06/07/2021 0431 Full Code [id number]  Waddell Rake, MD ED   04/12/2020 0114 04/14/2020 1847 Full Code [id number]  Lonzell Emeline HERO, DO ED   10/05/2017 1544 10/15/2017 1619 Full Code [id number]  Black, Darice HERO, NP ED         IV Access:   Peripheral IV   Procedures and diagnostic studies:   No results found.    Medical Consultants:   None.   Subjective:    Hulda Trudy Collet nonverbal this morning. Objective:    Vitals:   05/24/24 2221 05/25/24 0000 05/25/24 0645 05/25/24 0949  BP: (!) 154/106  133/84 122/87  Pulse: (!) 103  72 67  Resp:   20   Temp: 100.1 F (37.8 C) 98.9 F (37.2 C) 99.2 F (37.3 C)   TempSrc: Oral Oral Oral   SpO2: 93%  93%   Weight:      Height:       SpO2: 93 %   Intake/Output Summary (Last 24 hours) at 05/25/2024 0952 Last data filed at 05/25/2024 0950 Gross per 24 hour  Intake 1400 ml  Output 550 ml  Net 850 ml   Filed Weights   05/20/24 1124  Weight: 94.9 kg    Exam: General exam: In no acute distress. Respiratory system: Good air movement and clear to auscultation. Cardiovascular system: S1 & S2 heard, RRR. No JVD. Gastrointestinal system: Abdomen is nondistended, soft and nontender.  G-tube in place. Extremities: No pedal edema. Skin: No rashes, lesions or ulcers Psychiatry: No judgment or insight of medical condition.  Data Reviewed:    Labs: Basic Metabolic Panel: Recent Labs  Lab 05/21/24 0339 05/22/24 0409 05/23/24 0350 05/24/24 0312 05/25/24 0337  NA 146* 148* 147* 145 143  K 3.8 3.7 3.6 3.1* 4.5  CL 111 114* 114* 113* 115*  CO2 28 25 24 25  21*  GLUCOSE 107* 107* 106* 92 106*  BUN 38* 28* 20 13 13   CREATININE 0.72 0.88 0.78 0.58 0.72  CALCIUM  8.1* 8.1* 7.9* 7.9* 7.9*    GFR Estimated Creatinine Clearance: 78.2 mL/min (by C-G formula based on SCr of 0.72 mg/dL). Liver Function Tests: Recent Labs  Lab 05/19/24 1442  AST 57*  ALT 12  ALKPHOS 214*  BILITOT 1.0  PROT 9.0*  ALBUMIN  2.1*   No results for input(s): LIPASE, AMYLASE in the last 168 hours. No results for input(s): AMMONIA in the last 168 hours. Coagulation profile Recent Labs  Lab 05/19/24 1442  INR 1.5*   COVID-19 Labs  No results for input(s): DDIMER, FERRITIN, LDH, CRP in the last 72 hours.  Lab Results  Component Value Date   SARSCOV2NAA NEGATIVE 11/16/2022   SARSCOV2NAA NEGATIVE 02/15/2022   SARSCOV2NAA NEGATIVE 04/11/2020    CBC: Recent Labs  Lab 05/19/24 1442 05/19/24 1455 05/19/24 2152 05/20/24 0551  WBC 13.3*  --  9.9 11.1*  NEUTROABS 6.6  --   --   --  HGB 6.6* 12.9 11.2* 10.9*  HCT 21.7* 38.0 36.1 34.1*  MCV 111.9*  --  107.8* 108.9*  PLT 171  --  140* 157   Cardiac Enzymes: No results for input(s): CKTOTAL, CKMB, CKMBINDEX, TROPONINI in the last 168 hours. BNP (last 3 results) No results for input(s): PROBNP in the last 8760 hours. CBG: No results for input(s): GLUCAP in the last 168 hours. D-Dimer: No results for input(s): DDIMER in the last 72 hours. Hgb A1c: No results for input(s): HGBA1C in the last 72 hours. Lipid Profile: No results for input(s): CHOL, HDL, LDLCALC, TRIG, CHOLHDL, LDLDIRECT in the last 72 hours. Thyroid  function studies: No results for input(s): TSH, T4TOTAL, T3FREE, THYROIDAB in the last 72 hours.  Invalid input(s): FREET3 Anemia work up: No results for input(s): VITAMINB12, FOLATE, FERRITIN, TIBC, IRON, RETICCTPCT in the last 72 hours. Sepsis Labs: Recent Labs  Lab 05/19/24 1442 05/19/24 1454 05/19/24 1709 05/19/24 2152 05/20/24 0551 05/20/24 0552  WBC 13.3*  --   --  9.9 11.1*  --   LATICACIDVEN  --  2.9* 2.8*  --   --  1.9    Microbiology Recent Results (from the past 240 hours)  Urine Culture     Status: Abnormal   Collection Time: 05/19/24  2:22 PM   Specimen: Urine, Random  Result Value Ref Range Status   Specimen Description   Final    URINE, RANDOM Performed at [facility], [address], [place], [place] [address]    Special Requests   Final    NONE Reflexed from [id number] Performed at [facility], [address] [place], [place] [address]    Culture >=100,000 COLONIES/mL PSEUDOMONAS AERUGINOSA (A)  Final   Report Status 05/21/2024 FINAL  Final   Organism ID, Bacteria PSEUDOMONAS AERUGINOSA (A)  Final      Susceptibility   Pseudomonas aeruginosa - MIC*    MEROPENEM 0.5 SENSITIVE Sensitive     CIPROFLOXACIN 0.12 SENSITIVE Sensitive     IMIPENEM 2 SENSITIVE Sensitive     PIP/TAZO Value in next row Sensitive      <=4 SENSITIVEThis is a modified FDA-approved test that has been validated and its performance characteristics determined by the reporting laboratory.  This laboratory is certified under the Clinical Laboratory Improvement Amendments CLIA as qualified to perform high complexity clinical laboratory testing.    CEFEPIME  Value in next row Sensitive      <=4 SENSITIVEThis is a modified FDA-approved test that has been validated and its performance characteristics determined by the reporting laboratory.  This laboratory is certified under the Clinical Laboratory Improvement Amendments CLIA as qualified to perform high complexity clinical laboratory testing.    CEFTAZIDIME/AVIBACTAM Value in next row Sensitive      <=4 SENSITIVEThis is a modified FDA-approved test that has been validated and its performance characteristics determined by the reporting laboratory.  This laboratory is certified under the Clinical Laboratory Improvement Amendments CLIA as qualified to perform high complexity clinical laboratory testing.    CEFTOLOZANE/TAZOBACTAM Value in next row  Sensitive      <=4 SENSITIVEThis is a modified FDA-approved test that has been validated and its performance characteristics determined by the reporting laboratory.  This laboratory is certified under the Clinical Laboratory Improvement Amendments CLIA as qualified to perform high complexity clinical laboratory testing.    TOBRAMYCIN Value in next row Sensitive      <=4 SENSITIVEThis is a modified FDA-approved test that has been validated and its performance characteristics  determined by the reporting laboratory.  This laboratory is certified under the Clinical Laboratory Improvement Amendments CLIA as qualified to perform high complexity clinical laboratory testing.    CEFTAZIDIME Value in next row Sensitive      <=4 SENSITIVEThis is a modified FDA-approved test that has been validated and its performance characteristics determined by the reporting laboratory.  This laboratory is certified under the Clinical Laboratory Improvement Amendments CLIA as qualified to perform high complexity clinical laboratory testing.    * >=100,000 COLONIES/mL PSEUDOMONAS AERUGINOSA  Blood Culture (routine x 2)     Status: None   Collection Time: 05/19/24  2:42 PM   Specimen: BLOOD  Result Value Ref Range Status   Specimen Description   Final    BLOOD BLOOD LEFT ARM Performed at [facility], [address], [place], [place] [address]    Special Requests   Final    BOTTLES DRAWN AEROBIC ONLY Blood Culture results may not be optimal due to an inadequate volume of blood received in culture bottles Performed at [facility], [address], [place], [place] [address]    Culture   Final    NO GROWTH 5 DAYS Performed at [facility], [address] [place], [place] [address]    Report Status 05/24/2024 FINAL  Final  Blood Culture (routine x 2)     Status: None   Collection Time: 05/19/24  4:15 PM   Specimen: BLOOD  Result Value Ref Range Status   Specimen Description    Final    BLOOD LEFT ANTECUBITAL Performed at [facility], [address], [place], [place] [address]    Special Requests   Final    BOTTLES DRAWN AEROBIC AND ANAEROBIC Blood Culture adequate volume Performed at [facility], [address], [place], [place] [address]    Culture   Final    NO GROWTH 5 DAYS Performed at [facility], [address] [place], [place] [address]    Report Status 05/24/2024 FINAL  Final     Medications:    sodium chloride    Intravenous Once   amiodarone   200 mg Per Tube Daily   aspirin   81 mg Per Tube QHS   cephALEXin  500 mg Per Tube Q6H   Chlorhexidine  Gluconate Cloth  6 each Topical Daily   ciprofloxacin  500 mg Per Tube BID   cyclobenzaprine   2.5 mg Per Tube Q12H   docusate  100 mg Per Tube BID   enoxaparin  (LOVENOX ) injection  40 mg Subcutaneous Q24H   famotidine   40 mg Per Tube Daily   folic acid   1 mg Per Tube Daily   free water   200 mL Per Tube Q4H   levETIRAcetam   1,000 mg Per Tube BID   liver oil-zinc  oxide   Topical BID   metoprolol  tartrate  25 mg Per Tube BID   multivitamin with minerals  1 tablet Per Tube Daily   simethicone   80 mg Per Tube TID   simvastatin   10 mg Per Tube QHS   sodium chloride  flush  3 mL Intravenous Q12H   valproic  acid  500 mg Per Tube TID with meals   And   valproic  acid  750 mg Per Tube QHS   Continuous Infusions:      LOS: 5 days   Erle Odell Castor  [facility]  05/25/2024, 9:52 AM

## 2024-05-25 NOTE — Progress Notes (Signed)
 I met with Jkayla's daughter who is unclear about what is happening with her mother.  Kathleen Jensen has 3 daughters as well as many people who love her.  And yet, DSS is the one who decides her whereabouts and care progress.  I provided support around her not being to have access to the doctors and connected with the doctors to let them know this.

## 2024-05-25 NOTE — Progress Notes (Signed)
 Hypoglycemic Event  CBG: 67  Treatment: D50 25 mL (12.5 gm)  Symptoms: Pale  Follow-up CBG: Time:1636 CBG Result: 94  Possible Reasons for Event: Inadequate meal intake (NPO-tube feeds now resumed)  Comments/MD notified: MD Odell Celinda Jinnie Georgie

## 2024-05-25 NOTE — Progress Notes (Signed)
 Daily Progress Note   Patient Name: Kathleen Jensen       Date: 05/25/2024 DOB: 1950-12-21  Age: 73 y.o. MRN#: [id number] Attending Physician: Odell Celinda Balo, MD Primary Care Physician: System, Provider Not In Admit Date: 05/19/2024 Length of Stay: 5 days  Reason for Consultation/Follow-up: Establishing goals of care  Subjective:   EMR reviewed including documentation from transition of care and hospitalist.  Note that patient has a legal guardian from DSS, Kathleen Jensen.  Plan noted to discharge back to long-term care facility with palliative medicine following.  I called and left a message for [name] after seeing patient this morning and received a callback from her.  We discussed plan for patient to transition back to long-term care facility with palliative support.  We discussed prior conversation with Dr. Clayton regardig consideration of limitation of care being DNR/DNI in the event of cardiac or respiratory arrest in light of patient's multiple comorbidities and poor functional status.  [name] noted that paperwork is generally submitted and at that point discussion will be held amongst DSS staff and manager prior to making any changes to CODE STATUS.  I let her know that I am familiar and have completed similar paperwork in the past, however, in the past it was provided by DSS staff.  [name] reports that generally the forms submitted to her are provided by facilities.  Will continue to see if I can determine what form needs to be submitted and complete if we are able to locate prior to her discharge to facility.  At the same time, however, she is getting close to discharge and we discussed that this may be something that needs to be continued by outpatient palliative care at facility.  [name] reports that she would like for patient to be followed by outpatient palliative care and also thinks it would be beneficial to have a family meeting involving herself or other  DSS staff, palliative care provider, and patient's family.   Discussed with hospitalist and TOC.  TOC unsure where to obtain paperwork requested by DSS.  Will attempt to research further.  She has a current MOST form on her chart with plan for aggressive care including full code, full scope treatment, antibiotics and IV fluids, and long-term feeding tube.  Objective:   Vital Signs:  BP (!) 150/81 (BP Location: Right Leg)   Pulse 63   Temp 97.7 F (36.5 C) (Oral)   Resp 18   Ht 5' 10 (1.778 m)   Wt 94.9 kg   SpO2 94%   BMI 30.02 kg/m   Physical Exam: General: Opens eyes, awake and mumbles but does not engage in conversation Cardiovascular: Regular rate and rhythm Respiratory: No increased work of breathing, scattered rhonchi Abdomen: Soft, nontender Neuro: Awake but does not follow commands  Assessment & Plan:   Assessment: Patient is a 73 year old female who is a long-term nursing home resident with a history of CVA, bedbound status, chronic PEG placement, chronic indwelling Foley catheter, seizure disorder, dementia, and endocarditis on lifelong suppressive antibiotics since not felt to be a good surgical candidate who was admitted on 05/19/2024 from nursing home due to PEG tube leakage. During hospitalization, patient has received management for sepsis secondary to infected jejunostomy tube/cellulitis, AKI, and wound care for sacral decubitus stage IV ulcer present on admission. Palliative medicine team consulted to assist with complex medical decision making.   Recommendations/Plan: # Complex medical decision making/goals of care:       -  Patient cannot participate in decision-making due to underlying medical status and poor mental status.                - Care plan discussed with patient's guardian, Kathleen Jensen from DSS.  Will continue to try and locate paperwork requested for consideration for changing CODE STATUS.  For now, plan is for continued aggressive care and to  pursue discharge back to long-term care facility with recommendation for palliative care to follow as an outpatient.  Hopefully to continue conversation regarding long-term goals of care and DSS guardian is hopeful they will be able to facilitate a family meeting with DSS staff and family present to discuss overall condition, goals, and plan moving forward.  Palliative medicine team continuing to engage in conversations as able and appropriate moving forward.                -  Code Status: Full Code                                 # Psycho-social/Spiritual Support:  - Support System: Legal Guardian- Kathleen Jensen; Family including daughters listed in chart    # Discharge Planning: Return to long-term care facility with outpatient palliative care service following  Discussed with: hospitalist, RN. TOC, patient's guardian   Thank you for allowing the palliative care team to participate in the care Bill Trudy Collet.  Amaryllis Meissner, MD [facility] Palliative Medicine Team [phone]  [facility] if patient remains symptomatic despite maximum doses, please call PMT at [phone] between 0700 and 1900. Outside of these hours, please call attending, as PMT does not have night coverage.   I personally spent a total of 40 minutes in the care of the patient today including preparing to see the patient, getting/reviewing separately obtained history, performing a medically appropriate exam/evaluation, documenting clinical information in the EHR, communicating results, and coordinating care.

## 2024-05-25 NOTE — Progress Notes (Signed)
 Initial Nutrition Assessment  DOCUMENTATION CODES:   Obesity unspecified  INTERVENTION:   -Initiate tube feeding via J-tube: Osmolite 1.2 at 60 ml/h (1440 ml per day) *Start at 34mL/hr and advance by 10mL Q12H Prosource TF20 60 ml daily Provides 1808 kcal, 100 gm protein, 1181 ml free water  daily  - Monitor magnesium , potassium, and phosphorus daily for at least 3 days, MD to replete as needed, as pt is at risk for refeeding syndrome given no nutrition intake since admit (x6 days). - Add 100mg  thiamine  x5 days for refeeding risk.   - FWF per MD. Currently ordered 200mL Q4H ( ). In addition to tube feeds at goal, this is a total of 2348mL/day.   - Monitor weight trends.   - No weight taken since initial admit weight, ordered new weight,   NUTRITION DIAGNOSIS:   Inadequate oral intake related to inability to eat as evidenced by NPO status.  GOAL:   Patient will meet greater than or equal to 90% of their needs  MONITOR:   Labs, Weight trends, TF tolerance, Skin  REASON FOR ASSESSMENT:   Consult Assessment of nutrition requirement/status, Enteral/tube feeding initiation and management  ASSESSMENT:   73 y.o. female who is a nursing home resident with PMH of CVA, bedbound with a chronic J-tube, seizure disorder, advanced dementia, recently discharged 2 months ago for endocarditis. Presented from nursing home because of J-tube leakage, CT scan of the abdomen pelvis showed jejunostomy tube in place without any obvious complication. Admitted for sepsis and AKI  Patient in bed at time of visit, RN at bedside. Patient is nonverbal, RD unable to obtain nutrition history at this time.   Per EMR, weight stable between 209-214# from November 2024-August 2025. Weights varied significantly in August and patient weighed as much as 242# and on day of discharge was 236#. She was weighed back down at 209# this admission, which could indicate weight loss however unable to verify.    Patient is said to have a G-tube in multiple provider notes, however confirmed with RN patient has a J-tube.  No tube feeds listed in home meds list so unable to verify what patient was getting PTA.   Previous RD notes indicate patient has previously been on Osmolite 1.5, getting cyclic feeds to space TF around Dilantin  administration. Patient is not currently on dilantin  and this is not listed in her home meds list either.  Patient has been NPO and not getting any tube feeds since admit 6 days ago.  MD consulted to start tube feeds today. Suspect she may be at risk for refeeding, so will restart feeds at 46mL/hr and advance slowly with monitoring of electrolytes. Will add thiamine  as well.    Medications reviewed and include: Colace, MVI, 1mg  folic acid , 200mL Q4H FWF  Labs reviewed:  -   NUTRITION - FOCUSED PHYSICAL EXAM:  Flowsheet Row Most Recent Value  Orbital Region No depletion  Upper Arm Region No depletion  Thoracic and Lumbar Region No depletion  Buccal Region No depletion  Temple Region No depletion  Clavicle Bone Region No depletion  Clavicle and Acromion Bone Region No depletion  Scapular Bone Region Unable to assess  Dorsal Hand No depletion  Patellar Region No depletion  Anterior Thigh Region No depletion  Posterior Calf Region No depletion  Edema (RD Assessment) Mild  Hair Reviewed  Eyes Reviewed  Mouth Reviewed  Skin Reviewed  Nails Reviewed    Diet Order:   Diet Order  Diet NPO time specified  Diet effective now                   EDUCATION NEEDS:  Not appropriate for education at this time  Skin:  Skin Assessment: Skin Integrity Issues: Skin Integrity Issues:: DTI, Unstageable DTI: Buttocks Unstageable: Mid Coccyx  Last BM:  10/27 - type 7  Height:  Ht Readings from Last 1 Encounters:  05/20/24 5' 10 (1.778 m)   Weight:  Wt Readings from Last 1 Encounters:  05/20/24 94.9 kg   Ideal Body Weight:  68.18 kg  BMI:   Body mass index is 30.02 kg/m.  Estimated Nutritional Needs:  Kcal:  1700-1900 kcals Protein:  90-105 grams Fluid:  >/= 1.7L    Trude Ned RD, LDN Contact via Secure Chat.

## 2024-05-26 DIAGNOSIS — R6520 Severe sepsis without septic shock: Secondary | ICD-10-CM

## 2024-05-26 DIAGNOSIS — A419 Sepsis, unspecified organism: Secondary | ICD-10-CM | POA: Diagnosis not present

## 2024-05-26 LAB — GLUCOSE, CAPILLARY
Glucose-Capillary: 144 mg/dL — ABNORMAL HIGH (ref 70–99)
Glucose-Capillary: 119 mg/dL — ABNORMAL HIGH (ref 70–99)
Glucose-Capillary: 136 mg/dL — ABNORMAL HIGH (ref 70–99)
Glucose-Capillary: 153 mg/dL — ABNORMAL HIGH (ref 70–99)
Glucose-Capillary: 135 mg/dL — ABNORMAL HIGH (ref 70–99)
Glucose-Capillary: 110 mg/dL — ABNORMAL HIGH (ref 70–99)

## 2024-05-26 LAB — MAGNESIUM: Magnesium: 2.4 mg/dL (ref 1.7–2.4)

## 2024-05-26 LAB — PHOSPHORUS: Phosphorus: 2 mg/dL — ABNORMAL LOW (ref 2.5–4.6)

## 2024-05-26 MED ORDER — CARMEX CLASSIC LIP BALM EX OINT
1.0000 | TOPICAL_OINTMENT | CUTANEOUS | Status: DC | PRN
  Administered 2024-05-29: 1 via TOPICAL
  Filled 2024-05-26 (×2): qty 10

## 2024-05-26 NOTE — Progress Notes (Signed)
 PROGRESS NOTE    Kathleen Jensen  FMW:[id number] DOB: 05-May-1951 DOA: 05/19/2024 PCP: System, Provider Not In   Brief Narrative:  73 y.o. 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  She was sent from nursing home due to feeling tube leakage.  CT of the abdomen and pelvis showed jejunostomy tube in place without any obvious complication, adynamic ileus with initial lactic acid of 2.8, hemoglobin of 6.3, repeated hemoglobin was 12 with no leukocytosis.  She was admitted for sepsis secondary to cellulitis along with UTI.  She was initially started on IV antibiotics.  Urine cultures grew Pseudomonas: Antibiotics switched to ciprofloxacin.  Palliative care consulted for goals of care discussion.  Assessment & Plan:   Sepsis: Present on admission Pseudomonas UTI: Present on admission Jejunostomy tube site cellulitis -Imaging unremarkable. Interventional radiology was consulted they recommended to apply Desitin to skin surface, tape to tube to prevent slippage.  Okay to use feeding tube.  - Currently on oral ciprofloxacin: Complete 7-day course of therapy - She will need to be on suppressive antibiotic therapy for endocarditis on discharge - Sepsis has resolved.  Currently medically stable to discharge to SNF  Acute kidney injury - Resolved.  Diuretics on hold  Hypernatremia - Improved.  Continue free water  per tube  History of endocarditis - In the setting of AICD currently on cephalexin.  Resume suppressive cefadroxil on discharge  Unspecified CVA Hyperlipidemia - Continue aspirin  and  statin  History of seizure disorder Continue Keppra  and valproic  acid  Paroxysmal A-fib - Currently rate controlled.  Continue amiodarone  and metoprolol   Essential hypertension--continue metoprolol   GERD Continue famotidine   Hypokalemia - Resolved  Thrombocytopenia - Resolved  Nutrition - Continue jejunostomy tube feeding as per dietitian recommendations.  Continue free water  per tube  Stage IV sacral decubitus ulcer: Present on admission - Continue wound care as per wound care consult recommendations  Goals of care - Overall prognosis is poor.  Patient remains full code.  Palliative care following.  Obesity class I - Outpatient follow-up   DVT prophylaxis: Lovenox  Code Status: Full Family Communication: None at bedside Disposition Plan: Status is: Inpatient Remains inpatient appropriate because: Of severity of illness.  Need for SNF placement    Consultants: Palliative care  Procedures: None  Antimicrobials:  Anti-infectives (From admission, onward)    Start     Dose/Rate Route Frequency Ordered Stop   05/24/24 0800  ciprofloxacin (CIPRO) tablet 500 mg        500 mg Per Tube 2 times daily 05/24/24 0248     05/23/24 1200  cephALEXin (KEFLEX) 250 MG/5ML suspension 500 mg        500 mg Per Tube Every 6 hours 05/23/24 0755     05/23/24 1000  ciprofloxacin (CIPRO) tablet 500 mg  Status:  Discontinued        500 mg Oral 2 times daily 05/23/24 0731 05/24/24 0247   05/23/24 1000  cefadroxil (DURICEF) 500 MG/5ML suspension 1,000 mg  Status:  Discontinued        1,000 mg Oral Every 12 hours 05/23/24 0731 05/23/24 0755   05/22/24 0600  vancomycin  (VANCOREADY) IVPB 1500 mg/300 mL  Status:  Discontinued        1,500 mg 150 mL/hr over 120 Minutes Intravenous Every 24 hours 05/21/24 0814 05/21/24 1438   05/21/24 1530  piperacillin-tazobactam (ZOSYN) IVPB 3.375 g  Status:  Discontinued        3.375 g 12.5 mL/hr over 240 Minutes Intravenous Every 8 hours 05/21/24 1438  05/23/24 0731   05/21/24 0800  cefTRIAXone  (ROCEPHIN ) 2 g in sodium chloride  0.9 % 100 mL IVPB  Status:  Discontinued        2 g 200 mL/hr over 30 Minutes Intravenous Every 24 hours 05/20/24 0722 05/21/24 1438   05/21/24 0600  vancomycin  (VANCOREADY) IVPB 1250 mg/250 mL  Status:  Discontinued        1,250 mg 166.7 mL/hr over 90 Minutes Intravenous Every 24 hours 05/20/24 0724 05/21/24 0814   05/20/24 0645  vancomycin  (VANCOREADY) IVPB 2000 mg/400 mL        2,000 mg 200 mL/hr over 120 Minutes Intravenous  Once 05/20/24 0630 05/20/24 0921   05/20/24 0630  cefTRIAXone  (ROCEPHIN ) 1 g in sodium chloride  0.9 % 100 mL IVPB        1 g 200 mL/hr over 30 Minutes Intravenous  Once 05/20/24 0624 05/20/24 0724   05/19/24 2315  cefadroxil (DURICEF) capsule 1,000 mg  Status:  Discontinued        1,000 mg Per Tube Every 12 hours 05/19/24 2203 05/20/24 0626   05/19/24 1530  cefTRIAXone  (ROCEPHIN ) 1 g in sodium chloride  0.9 % 100 mL IVPB        1 g 200 mL/hr over 30 Minutes Intravenous  Once 05/19/24 1529 05/19/24 1739        Subjective: Patient seen and examined at bedside.  No fever, seizures or agitation reported.  Objective: Vitals:   05/26/24 0008 05/26/24 0638 05/26/24 0700 05/26/24 0931  BP: (!) 140/68 (!) 150/74  125/77  Pulse: 65 77  89  Resp: 18 18  17   Temp: 99.4 F (37.4 C) 98.4 F (36.9 C)  98.3 F (36.8 C)  TempSrc: Oral Oral    SpO2: 95%   96%  Weight:   100.1 kg   Height:        Intake/Output Summary (Last 24 hours) at 05/26/2024 1145 Last data filed at 05/26/2024 0959 Gross per 24 hour  Intake 2062.01 ml  Output 840 ml  Net 1222.01 ml   Filed Weights   05/20/24 1124 05/26/24 0700  Weight: 94.9 kg 100.1 kg    Examination:  General exam: Chronically ill and deconditioned looking.  No distress  respiratory system: Bilateral decreased breath sounds at bases with scattered crackles Cardiovascular system: S1 & S2 heard, Rate controlled Gastrointestinal system:  Abdomen is obese, nondistended, soft and nontender. Normal bowel sounds heard.  Jejunostomy tube present. Extremities: No cyanosis, clubbing, edema  Central nervous system: Wakes up slightly, hardly follows any commands.   Skin: No rashes, lesions or ulcers Psychiatry: Could not be assessed because of mental status.    Data Reviewed: I have personally reviewed following labs and imaging studies  CBC: Recent Labs  Lab 05/19/24 1442 05/19/24 1455 05/19/24 2152 05/20/24 0551  WBC 13.3*  --  9.9 11.1*  NEUTROABS 6.6  --   --   --   HGB 6.6* 12.9 11.2* 10.9*  HCT 21.7* 38.0 36.1 34.1*  MCV 111.9*  --  107.8* 108.9*  PLT 171  --  140* 157   Basic Metabolic Panel: Recent Labs  Lab 05/21/24 0339 05/22/24 0409 05/23/24 0350  05/24/24 0312 05/25/24 0337 05/26/24 0351  NA 146* 148* 147* 145 143  --   K 3.8 3.7 3.6 3.1* 4.5  --   CL 111 114* 114* 113* 115*  --   CO2 28 25 24 25  21*  --   GLUCOSE 107* 107* 106* 92 106*  --   BUN 38* 28* 20 13 13   --   CREATININE 0.72 0.88 0.78 0.58 0.72  --   CALCIUM  8.1* 8.1* 7.9* 7.9* 7.9*  --   MG  --   --   --   --  2.6* 2.4  PHOS  --   --   --   --  2.2* 2.0*   GFR: Estimated Creatinine Clearance: 80.2 mL/min (by C-G formula based on SCr of 0.72 mg/dL). Liver Function Tests: Recent Labs  Lab 05/19/24 1442  AST 57*  ALT 12  ALKPHOS 214*  BILITOT 1.0  PROT 9.0*  ALBUMIN  2.1*   No results for input(s): LIPASE, AMYLASE in the last 168 hours. No results for input(s): AMMONIA in the last 168 hours. Coagulation Profile: Recent Labs  Lab 05/19/24 1442  INR 1.5*   Cardiac Enzymes: No results for input(s): CKTOTAL, CKMB, CKMBINDEX, TROPONINI in the last 168 hours. BNP (last 3 results) No results for input(s): PROBNP in the last 8760 hours. HbA1C: No results for input(s): HGBA1C in the last 72 hours. CBG: Recent Labs  Lab 05/25/24 1636 05/25/24 2119 05/26/24 0208 05/26/24 0637 05/26/24 0815  GLUCAP 94 95  144* 119* 136*   Lipid Profile: No results for input(s): CHOL, HDL, LDLCALC, TRIG, CHOLHDL, LDLDIRECT in the last 72 hours. Thyroid  Function Tests: No results for input(s): TSH, T4TOTAL, FREET4, T3FREE, THYROIDAB in the last 72 hours. Anemia Panel: No results for input(s): VITAMINB12, FOLATE, FERRITIN, TIBC, IRON, RETICCTPCT in the last 72 hours. Sepsis Labs: Recent Labs  Lab 05/19/24 1454 05/19/24 1709 05/20/24 0552  LATICACIDVEN 2.9* 2.8* 1.9    Recent Results (from the past 240 hours)  Urine Culture     Status: Abnormal   Collection Time: 05/19/24  2:22 PM   Specimen: Urine, Random  Result Value Ref Range Status   Specimen Description   Final    URINE, RANDOM Performed at [facility], [address] [place], [place] [address]    Special Requests   Final    NONE Reflexed from [id number] Performed at [facility], [address], [place], [place] [address]    Culture >=100,000 COLONIES/mL PSEUDOMONAS AERUGINOSA (A)  Final   Report Status 05/21/2024 FINAL  Final   Organism ID, Bacteria PSEUDOMONAS AERUGINOSA (A)  Final      Susceptibility   Pseudomonas aeruginosa - MIC*    MEROPENEM 0.5 SENSITIVE Sensitive     CIPROFLOXACIN 0.12 SENSITIVE Sensitive     IMIPENEM 2 SENSITIVE Sensitive     PIP/TAZO Value in next row Sensitive      <=4 SENSITIVEThis is a modified FDA-approved test that has been validated and its performance characteristics determined by the reporting laboratory.  This laboratory is certified under the Clinical Laboratory Improvement Amendments CLIA as qualified to perform high complexity clinical laboratory testing.    CEFEPIME  Value in next row Sensitive      <=4 SENSITIVEThis is a modified FDA-approved test that has been validated and its performance characteristics determined by the reporting laboratory.  This laboratory is certified under the Clinical Laboratory Improvement Amendments CLIA  as qualified to perform high complexity clinical laboratory testing.  CEFTAZIDIME/AVIBACTAM Value in next row Sensitive      <=4 SENSITIVEThis is a modified FDA-approved test that has been validated and its performance characteristics determined by the reporting laboratory.  This laboratory is certified under the Clinical Laboratory Improvement Amendments CLIA as qualified to perform high complexity clinical laboratory testing.    CEFTOLOZANE/TAZOBACTAM Value in next row Sensitive      <=4 SENSITIVEThis is a modified FDA-approved test that has been validated and its performance characteristics determined by the reporting laboratory.  This laboratory is certified under the Clinical Laboratory Improvement Amendments CLIA as qualified to perform high complexity clinical laboratory testing.    TOBRAMYCIN Value in next row Sensitive      <=4 SENSITIVEThis is a modified FDA-approved test that has been validated and its performance characteristics determined by the reporting laboratory.  This laboratory is certified under the Clinical Laboratory Improvement Amendments CLIA as qualified to perform high complexity clinical laboratory testing.    CEFTAZIDIME Value in next row Sensitive      <=4 SENSITIVEThis is a modified FDA-approved test that has been validated and its performance characteristics determined by the reporting laboratory.  This laboratory is certified under the Clinical Laboratory Improvement Amendments CLIA as qualified to perform high complexity clinical laboratory testing.    * >=100,000 COLONIES/mL PSEUDOMONAS AERUGINOSA  Blood Culture (routine x 2)     Status: None   Collection Time: 05/19/24  2:42 PM   Specimen: BLOOD  Result Value Ref Range Status   Specimen Description   Final    BLOOD BLOOD LEFT ARM Performed at [facility], [address], [place], [place] [address]    Special Requests   Final    BOTTLES DRAWN AEROBIC ONLY Blood Culture results may not be  optimal due to an inadequate volume of blood received in culture bottles Performed at [facility], [address], [place], [place] [address]    Culture   Final    NO GROWTH 5 DAYS Performed at [facility], [address], [place], [place] [address]    Report Status 05/24/2024 FINAL  Final  Blood Culture (routine x 2)     Status: None   Collection Time: 05/19/24  4:15 PM   Specimen: BLOOD  Result Value Ref Range Status   Specimen Description   Final    BLOOD LEFT ANTECUBITAL Performed at [facility], [address], [place], [place] [address]    Special Requests   Final    BOTTLES DRAWN AEROBIC AND ANAEROBIC Blood Culture adequate volume Performed at [facility], [address], [place], [place] [address]    Culture   Final    NO GROWTH 5 DAYS Performed at [facility], [address] [place], [place] [address]    Report Status 05/24/2024 FINAL  Final         Radiology Studies: No results found.      Scheduled Meds:  sodium chloride    Intravenous Once   amiodarone   200 mg Per Tube Daily   aspirin   81 mg Per Tube QHS   cephALEXin  500 mg Per Tube Q6H   Chlorhexidine  Gluconate Cloth  6 each Topical Daily   ciprofloxacin  500 mg Per Tube BID   cyclobenzaprine   2.5 mg Per Tube Q12H   docusate  100 mg Per Tube BID   enoxaparin  (LOVENOX ) injection  40 mg Subcutaneous Q24H   famotidine   40 mg Per Tube Daily  feeding supplement (PROSource TF20)  60 mL Per Tube Daily   folic acid   1 mg Per Tube Daily   free water   200 mL Per Tube Q4H   levETIRAcetam   1,000 mg Per Tube BID   liver oil-zinc  oxide   Topical BID   metoprolol  tartrate  25 mg Per Tube BID   multivitamin with minerals  1 tablet Per Tube Daily   simethicone   80 mg Per Tube TID   simvastatin   10 mg Per Tube QHS   sodium chloride  flush  3 mL Intravenous Q12H   thiamine   100 mg Per Tube Daily   valproic  acid  500 mg Per Tube TID with  meals   And   valproic  acid  750 mg Per Tube QHS   Continuous Infusions:  feeding supplement (OSMOLITE 1.2 CAL) 30 mL/hr at 05/26/24 0901   lactated ringers  40 mL/hr at 05/26/24 0901          Sophie Mao, MD [facility] 05/26/2024, 11:45 AM

## 2024-05-26 NOTE — Plan of Care (Signed)

## 2024-05-27 ENCOUNTER — Inpatient Hospital Stay (HOSPITAL_COMMUNITY)

## 2024-05-27 DIAGNOSIS — R6520 Severe sepsis without septic shock: Secondary | ICD-10-CM | POA: Diagnosis not present

## 2024-05-27 DIAGNOSIS — A419 Sepsis, unspecified organism: Secondary | ICD-10-CM | POA: Diagnosis not present

## 2024-05-27 LAB — CBC WITH DIFFERENTIAL/PLATELET
Abs Immature Granulocytes: 0.09 K/uL — ABNORMAL HIGH (ref 0.00–0.07)
Basophils Absolute: 0 K/uL (ref 0.0–0.1)
Basophils Relative: 0 %
Eosinophils Absolute: 0 K/uL (ref 0.0–0.5)
Eosinophils Relative: 0 %
HCT: 32 % — ABNORMAL LOW (ref 36.0–46.0)
Hemoglobin: 9.9 g/dL — ABNORMAL LOW (ref 12.0–15.0)
Immature Granulocytes: 1 %
Lymphocytes Relative: 26 %
Lymphs Abs: 3.6 K/uL (ref 0.7–4.0)
MCH: 35 pg — ABNORMAL HIGH (ref 26.0–34.0)
MCHC: 30.9 g/dL (ref 30.0–36.0)
MCV: 113.1 fL — ABNORMAL HIGH (ref 80.0–100.0)
Monocytes Absolute: 1.5 K/uL — ABNORMAL HIGH (ref 0.1–1.0)
Monocytes Relative: 11 %
Neutro Abs: 8.8 K/uL — ABNORMAL HIGH (ref 1.7–7.7)
Neutrophils Relative %: 62 %
Platelets: 165 K/uL (ref 150–400)
RBC: 2.83 MIL/uL — ABNORMAL LOW (ref 3.87–5.11)
RDW: 15.2 % (ref 11.5–15.5)
Smear Review: NORMAL
WBC: 14 K/uL — ABNORMAL HIGH (ref 4.0–10.5)
nRBC: 0.4 % — ABNORMAL HIGH (ref 0.0–0.2)

## 2024-05-27 LAB — BASIC METABOLIC PANEL WITH GFR
Anion gap: 7 (ref 5–15)
BUN: 22 mg/dL (ref 8–23)
CO2: 20 mmol/L — ABNORMAL LOW (ref 22–32)
Calcium: 7.5 mg/dL — ABNORMAL LOW (ref 8.9–10.3)
Chloride: 108 mmol/L (ref 98–111)
Creatinine, Ser: 0.97 mg/dL (ref 0.44–1.00)
GFR, Estimated: >60 mL/min (ref >60)
Glucose, Bld: 178 mg/dL — ABNORMAL HIGH (ref 70–99)
Potassium: 3.8 mmol/L (ref 3.5–5.1)
Sodium: 135 mmol/L (ref 135–145)

## 2024-05-27 LAB — MAGNESIUM: Magnesium: 2.7 mg/dL — ABNORMAL HIGH (ref 1.7–2.4)

## 2024-05-27 LAB — GLUCOSE, CAPILLARY
Glucose-Capillary: 162 mg/dL — ABNORMAL HIGH (ref 70–99)
Glucose-Capillary: 191 mg/dL — ABNORMAL HIGH (ref 70–99)
Glucose-Capillary: 199 mg/dL — ABNORMAL HIGH (ref 70–99)
Glucose-Capillary: 205 mg/dL — ABNORMAL HIGH (ref 70–99)
Glucose-Capillary: 235 mg/dL — ABNORMAL HIGH (ref 70–99)
Glucose-Capillary: 251 mg/dL — ABNORMAL HIGH (ref 70–99)

## 2024-05-27 LAB — PHOSPHORUS: Phosphorus: 1.7 mg/dL — ABNORMAL LOW (ref 2.5–4.6)

## 2024-05-27 NOTE — Plan of Care (Signed)
  Problem: Education: Goal: Knowledge of General Education information will improve Description: Including pain rating scale, medication(s)/side effects and non-pharmacologic comfort measures 05/27/2024 2056 by Debora Ou, RN Outcome: Not Progressing 05/27/2024 2056 by Debora Ou, RN Outcome: Progressing 05/27/2024 2056 by Debora Ou, RN Outcome: Progressing

## 2024-05-27 NOTE — Plan of Care (Signed)
  Problem: Coping: Goal: Level of anxiety will decrease Outcome: Progressing   Problem: Pain Managment: Goal: General experience of comfort will improve and/or be controlled Outcome: Progressing   Problem: Safety: Goal: Ability to remain free from injury will improve Outcome: Progressing   Problem: Skin Integrity: Goal: Risk for impaired skin integrity will decrease Outcome: Progressing

## 2024-05-27 NOTE — Progress Notes (Signed)
 PROGRESS NOTE    Kathleen Jensen  FMW:[id number] DOB: January 21, 1951 DOA: 05/19/2024 PCP: System, Provider Not In   Brief Narrative:  73 y.o. 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  She was sent from nursing home due to feeling tube leakage.  CT of the abdomen and pelvis showed jejunostomy tube in place without any obvious complication, adynamic ileus with initial lactic acid of 2.8, hemoglobin of 6.3, repeated hemoglobin was 12 with no leukocytosis.  She was admitted for sepsis secondary to cellulitis along with UTI.  She was initially started on IV antibiotics.  Urine cultures grew Pseudomonas: Antibiotics switched to ciprofloxacin.  Palliative care consulted for goals of care discussion.  Assessment & Plan:   Sepsis: Present on admission Pseudomonas UTI: Present on admission Jejunostomy tube site cellulitis -Imaging unremarkable. Interventional radiology was consulted they recommended to apply Desitin to skin surface, tape to tube to prevent slippage.  Okay to use feeding tube.  - Currently on oral ciprofloxacin: Complete 7-day course of therapy - She will need to be on suppressive antibiotic therapy for endocarditis on discharge - Had fever last night.  Will need further workup of fever if continues to have fever.  Acute kidney injury - Resolved.  Diuretics on hold  Hypernatremia - Improved.  Continue free water  per tube  Leukocytosis - Monitor  History of endocarditis - In the setting of AICD currently on cephalexin.  Resume suppressive cefadroxil on discharge  Unspecified  CVA Hyperlipidemia - Continue aspirin  and statin  History of seizure disorder -Continue Keppra  and valproic  acid  Paroxysmal A-fib - Currently rate controlled.  Continue amiodarone  and metoprolol   Essential hypertension--continue metoprolol   GERD Continue famotidine   Hypokalemia - Resolved  Thrombocytopenia - Resolved  Nutrition - Continue jejunostomy tube feeding as per dietitian recommendations.  Continue free water  per tube  Stage IV sacral decubitus ulcer: Present on admission - Continue wound care as per wound care consult recommendations  Goals of care - Overall prognosis is poor.  Patient remains full code.  Palliative care following.  Obesity class I - Outpatient follow-up   DVT prophylaxis: Lovenox  Code Status: Full Family Communication: None at bedside Disposition Plan: Status is: Inpatient Remains inpatient appropriate because: Of severity of illness.  Need for SNF placement    Consultants: Palliative care  Procedures: None  Antimicrobials:  Anti-infectives (From admission, onward)    Start     Dose/Rate Route Frequency Ordered Stop   05/24/24 0800  ciprofloxacin (CIPRO) tablet 500 mg        500 mg Per Tube 2 times daily 05/24/24 0248     05/23/24 1200  cephALEXin (KEFLEX) 250 MG/5ML suspension 500 mg        500 mg Per Tube Every 6 hours 05/23/24 0755     05/23/24 1000  ciprofloxacin (CIPRO) tablet 500 mg  Status:  Discontinued        500 mg Oral 2 times daily 05/23/24 0731 05/24/24 0247   05/23/24 1000  cefadroxil (DURICEF) 500 MG/5ML suspension 1,000 mg  Status:  Discontinued        1,000 mg Oral Every 12 hours 05/23/24 0731 05/23/24 0755   05/22/24 0600  vancomycin  (VANCOREADY) IVPB 1500 mg/300 mL  Status:  Discontinued        1,500 mg 150 mL/hr over 120 Minutes Intravenous Every 24 hours 05/21/24 0814 05/21/24 1438   05/21/24 1530  piperacillin-tazobactam (ZOSYN) IVPB 3.375 g  Status:  Discontinued        3.375 g 12.5 mL/hr over 240  Minutes Intravenous Every 8 hours 05/21/24 1438 05/23/24 0731   05/21/24 0800  cefTRIAXone  (ROCEPHIN ) 2 g in sodium chloride  0.9 % 100 mL IVPB  Status:  Discontinued        2 g 200 mL/hr over 30 Minutes Intravenous Every 24 hours 05/20/24 0722 05/21/24 1438   05/21/24 0600  vancomycin  (VANCOREADY) IVPB 1250 mg/250 mL  Status:  Discontinued        1,250 mg 166.7 mL/hr over 90 Minutes Intravenous Every 24 hours 05/20/24 0724 05/21/24 0814   05/20/24 0645  vancomycin  (VANCOREADY) IVPB 2000 mg/400 mL        2,000 mg 200 mL/hr over 120 Minutes Intravenous  Once 05/20/24 0630 05/20/24 0921   05/20/24 0630  cefTRIAXone  (ROCEPHIN ) 1 g in sodium chloride  0.9 % 100 mL IVPB        1 g 200 mL/hr over 30 Minutes Intravenous  Once 05/20/24 0624 05/20/24 0724   05/19/24 2315  cefadroxil (DURICEF) capsule 1,000 mg  Status:  Discontinued        1,000 mg Per Tube Every 12 hours 05/19/24 2203 05/20/24 0626   05/19/24 1530  cefTRIAXone  (ROCEPHIN ) 1 g in sodium chloride  0.9 % 100 mL IVPB        1 g 200 mL/hr over 30 Minutes Intravenous  Once 05/19/24 1529 05/19/24 1739        Subjective: Patient seen and examined at bedside.  Had fever last night.  No agitation, vomiting or seizures reported.  Objective: Vitals:   05/26/24 2148 05/26/24 2302 05/27/24 0426 05/27/24 0624  BP:  (!) 123/93  121/63  Pulse: (!) 109 77  79  Resp:  20  18  Temp:  (!) 101.4 F (38.6 C)  (!) 100.6 F (38.1 C)  TempSrc:  Oral    SpO2:      Weight:   103.2 kg   Height:        Intake/Output Summary (Last 24 hours) at 05/27/2024 0831 Last data filed at 05/27/2024 0600 Gross per 24 hour  Intake 2775.21 ml  Output 875 ml  Net 1900.21 ml   Filed Weights   05/20/24 1124 05/26/24 0700 05/27/24 0426  Weight: 94.9 kg 100.1 kg 103.2 kg    Examination:  General: On room air.  No acute distress.  Chronically ill and deconditioned looking. ENT/neck: No thyromegaly.  JVD is not elevated  respiratory: Decreased breath  sounds at bases bilaterally with some crackles; no wheezing  CVS: S1-S2 heard, rate controlled Abdominal: Soft, obese, nontender, slightly distended; no organomegaly, normal bowel sounds are heard.  Feeding tube present. Extremities: Trace lower extremity edema; no cyanosis  CNS: Wakes up slightly; hardly follows any commands.   Lymph: No obvious lymphadenopathy Skin: No obvious ecchymosis/lesions  psych: Can not be assessed because of her mental status  musculoskeletal: No obvious joint swelling/deformity     Data Reviewed: I have personally reviewed following labs and imaging studies  CBC: Recent Labs  Lab 05/27/24 0401  WBC 14.0*  NEUTROABS 8.8*  HGB 9.9*  HCT 32.0*  MCV 113.1*  PLT 165   Basic Metabolic Panel: Recent Labs  Lab 05/22/24 0409 05/23/24  9649 05/24/24 0312 05/25/24 0337 05/26/24 0351 05/27/24 0401  NA 148* 147* 145 143  --  135  K 3.7 3.6 3.1* 4.5  --  3.8  CL 114* 114* 113* 115*  --  108  CO2 25 24 25  21*  --  20*  GLUCOSE 107* 106* 92 106*  --  178*  BUN 28* 20 13 13   --  22  CREATININE 0.88 0.78 0.58 0.72  --  0.97  CALCIUM  8.1* 7.9* 7.9* 7.9*  --  7.5*  MG  --   --   --  2.6* 2.4 2.7*  PHOS  --   --   --  2.2* 2.0* 1.7*   GFR: Estimated Creatinine Clearance: 67.2 mL/min (by C-G formula based on SCr of 0.97 mg/dL). Liver Function Tests: No results for input(s): AST, ALT, ALKPHOS, BILITOT, PROT, ALBUMIN  in the last 168 hours.  No results for input(s): LIPASE, AMYLASE in the last 168 hours. No results for input(s): AMMONIA in the last 168 hours. Coagulation Profile: No results for input(s): INR, PROTIME in the last 168 hours.  Cardiac Enzymes: No results for input(s): CKTOTAL, CKMB, CKMBINDEX, TROPONINI in the last 168 hours. BNP (last 3 results) No results for input(s): PROBNP in the last 8760 hours. HbA1C: No results for input(s): HGBA1C in the last 72 hours. CBG: Recent Labs  Lab 05/26/24 1618  05/26/24 2135 05/27/24 0051 05/27/24 0345 05/27/24 0742  GLUCAP 135* 110* 199* 162* 205*   Lipid Profile: No results for input(s): CHOL, HDL, LDLCALC, TRIG, CHOLHDL, LDLDIRECT in the last 72 hours. Thyroid  Function Tests: No results for input(s): TSH, T4TOTAL, FREET4, T3FREE, THYROIDAB in the last 72 hours. Anemia Panel: No results for input(s): VITAMINB12, FOLATE, FERRITIN, TIBC, IRON, RETICCTPCT in the last 72 hours. Sepsis Labs: No results for input(s): PROCALCITON, LATICACIDVEN in the last 168 hours.   Recent Results (from the past 240 hours)  Urine Culture     Status: Abnormal   Collection Time: 05/19/24  2:22 PM   Specimen: Urine, Random  Result Value Ref Range Status   Specimen Description   Final    URINE, RANDOM Performed at [facility], [address] [place], [place] [address]    Special Requests   Final    NONE Reflexed from [id number] Performed at [facility], [address] [place], [place] [address]    Culture >=100,000 COLONIES/mL PSEUDOMONAS AERUGINOSA (A)  Final   Report Status 05/21/2024 FINAL  Final   Organism ID, Bacteria PSEUDOMONAS AERUGINOSA (A)  Final      Susceptibility   Pseudomonas aeruginosa - MIC*    MEROPENEM 0.5 SENSITIVE Sensitive     CIPROFLOXACIN 0.12 SENSITIVE Sensitive     IMIPENEM 2 SENSITIVE Sensitive     PIP/TAZO Value in next row Sensitive      <=4 SENSITIVEThis is a modified FDA-approved test that has been validated and its performance characteristics determined by the reporting laboratory.  This laboratory is certified under the Clinical Laboratory Improvement Amendments CLIA as qualified to perform high complexity clinical laboratory testing.    CEFEPIME  Value in next row Sensitive      <=4 SENSITIVEThis is a modified FDA-approved test that has been validated and its performance characteristics determined by the reporting laboratory.  This laboratory is  certified under the Clinical Laboratory Improvement Amendments CLIA as qualified to perform high complexity clinical laboratory testing.    CEFTAZIDIME/AVIBACTAM Value in next row Sensitive      <=4 SENSITIVEThis is  a modified FDA-approved test that has been validated and its performance characteristics determined by the reporting laboratory.  This laboratory is certified under the Clinical Laboratory Improvement Amendments CLIA as qualified to perform high complexity clinical laboratory testing.    CEFTOLOZANE/TAZOBACTAM Value in next row Sensitive      <=4 SENSITIVEThis is a modified FDA-approved test that has been validated and its performance characteristics determined by the reporting laboratory.  This laboratory is certified under the Clinical Laboratory Improvement Amendments CLIA as qualified to perform high complexity clinical laboratory testing.    TOBRAMYCIN Value in next row Sensitive      <=4 SENSITIVEThis is a modified FDA-approved test that has been validated and its performance characteristics determined by the reporting laboratory.  This laboratory is certified under the Clinical Laboratory Improvement Amendments CLIA as qualified to perform high complexity clinical laboratory testing.    CEFTAZIDIME Value in next row Sensitive      <=4 SENSITIVEThis is a modified FDA-approved test that has been validated and its performance characteristics determined by the reporting laboratory.  This laboratory is certified under the Clinical Laboratory Improvement Amendments CLIA as qualified to perform high complexity clinical laboratory testing.    * >=100,000 COLONIES/mL PSEUDOMONAS AERUGINOSA  Blood Culture (routine x 2)     Status: None   Collection Time: 05/19/24  2:42 PM   Specimen: BLOOD  Result Value Ref Range Status   Specimen Description   Final    BLOOD BLOOD LEFT ARM Performed at [facility], [address], [place], [place] [address]    Special Requests   Final     BOTTLES DRAWN AEROBIC ONLY Blood Culture results may not be optimal due to an inadequate volume of blood received in culture bottles Performed at [facility], [address] [place], [place] [address]    Culture   Final    NO GROWTH 5 DAYS Performed at [facility], [address], [place], [place] [address]    Report Status 05/24/2024 FINAL  Final  Blood Culture (routine x 2)     Status: None   Collection Time: 05/19/24  4:15 PM   Specimen: BLOOD  Result Value Ref Range Status   Specimen Description   Final    BLOOD LEFT ANTECUBITAL Performed at [facility], [address], [place], [place] [address]    Special Requests   Final    BOTTLES DRAWN AEROBIC AND ANAEROBIC Blood Culture adequate volume Performed at [facility], [address], [place], [place] [address]    Culture   Final    NO GROWTH 5 DAYS Performed at [facility], [address] [place], [place] [address]    Report Status 05/24/2024 FINAL  Final         Radiology Studies: No results found.      Scheduled Meds:  sodium chloride    Intravenous Once   amiodarone   200 mg Per Tube Daily   aspirin   81 mg Per Tube QHS   cephALEXin  500 mg Per Tube Q6H   Chlorhexidine  Gluconate Cloth  6 each Topical Daily   ciprofloxacin  500 mg Per Tube BID   cyclobenzaprine   2.5 mg Per Tube Q12H   docusate  100 mg Per Tube BID   enoxaparin  (LOVENOX ) injection  40 mg Subcutaneous Q24H   famotidine   40 mg Per Tube Daily   feeding supplement (PROSource TF20)  60 mL Per Tube Daily  folic acid   1 mg Per Tube Daily   free water   200 mL Per Tube Q4H   levETIRAcetam   1,000 mg Per Tube BID   liver oil-zinc  oxide   Topical BID   metoprolol  tartrate  25 mg Per Tube BID   multivitamin with minerals  1 tablet Per Tube Daily   simethicone   80 mg Per Tube TID   simvastatin   10 mg Per Tube QHS   sodium chloride  flush  3 mL Intravenous Q12H   thiamine   100  mg Per Tube Daily   valproic  acid  500 mg Per Tube TID with meals   And   valproic  acid  750 mg Per Tube QHS   Continuous Infusions:  feeding supplement (OSMOLITE 1.2 CAL) 50 mL/hr at 05/27/24 9661          Sophie Mao, MD [facility] 05/27/2024, 8:31 AM

## 2024-05-28 DIAGNOSIS — R6520 Severe sepsis without septic shock: Secondary | ICD-10-CM | POA: Diagnosis not present

## 2024-05-28 DIAGNOSIS — A419 Sepsis, unspecified organism: Secondary | ICD-10-CM | POA: Diagnosis not present

## 2024-05-28 LAB — CBC WITH DIFFERENTIAL/PLATELET
Basophils Absolute: 0.1 K/uL (ref 0.0–0.1)
Basophils Relative: 1 %
Eosinophils Absolute: 0.3 K/uL (ref 0.0–0.5)
Eosinophils Relative: 3 %
HCT: 31.3 % — ABNORMAL LOW (ref 36.0–46.0)
Hemoglobin: 9.4 g/dL — ABNORMAL LOW (ref 12.0–15.0)
Lymphocytes Relative: 15 %
Lymphs Abs: 1.4 K/uL (ref 0.7–4.0)
MCH: 33.1 pg (ref 26.0–34.0)
MCHC: 30 g/dL (ref 30.0–36.0)
MCV: 110.2 fL — ABNORMAL HIGH (ref 80.0–100.0)
Monocytes Absolute: 0.4 K/uL (ref 0.1–1.0)
Monocytes Relative: 4 %
Neutro Abs: 7.2 K/uL (ref 1.7–7.7)
Neutrophils Relative %: 77 %
Platelets: 118 K/uL — ABNORMAL LOW (ref 150–400)
RBC: 2.84 MIL/uL — ABNORMAL LOW (ref 3.87–5.11)
RDW: 14.8 % (ref 11.5–15.5)
Smear Review: NORMAL
WBC: 9.4 K/uL (ref 4.0–10.5)
nRBC: 0.2 % (ref 0.0–0.2)

## 2024-05-28 LAB — C-REACTIVE PROTEIN: CRP: 12.3 mg/dL — ABNORMAL HIGH (ref <1.0)

## 2024-05-28 LAB — GLUCOSE, CAPILLARY
Glucose-Capillary: 297 mg/dL — ABNORMAL HIGH (ref 70–99)
Glucose-Capillary: 298 mg/dL — ABNORMAL HIGH (ref 70–99)
Glucose-Capillary: 305 mg/dL — ABNORMAL HIGH (ref 70–99)
Glucose-Capillary: 310 mg/dL — ABNORMAL HIGH (ref 70–99)
Glucose-Capillary: 312 mg/dL — ABNORMAL HIGH (ref 70–99)
Glucose-Capillary: 329 mg/dL — ABNORMAL HIGH (ref 70–99)
Glucose-Capillary: 358 mg/dL — ABNORMAL HIGH (ref 70–99)

## 2024-05-28 LAB — COMPREHENSIVE METABOLIC PANEL WITH GFR
ALT: 12 U/L (ref 0–44)
AST: 45 U/L — ABNORMAL HIGH (ref 15–41)
Albumin: 1.7 g/dL — ABNORMAL LOW (ref 3.5–5.0)
Alkaline Phosphatase: 205 U/L — ABNORMAL HIGH (ref 38–126)
Anion gap: 8 (ref 5–15)
BUN: 25 mg/dL — ABNORMAL HIGH (ref 8–23)
CO2: 19 mmol/L — ABNORMAL LOW (ref 22–32)
Calcium: 7.1 mg/dL — ABNORMAL LOW (ref 8.9–10.3)
Chloride: 104 mmol/L (ref 98–111)
Creatinine, Ser: 0.76 mg/dL (ref 0.44–1.00)
GFR, Estimated: >60 mL/min (ref >60)
Glucose, Bld: 356 mg/dL — ABNORMAL HIGH (ref 70–99)
Potassium: 4.3 mmol/L (ref 3.5–5.1)
Sodium: 131 mmol/L — ABNORMAL LOW (ref 135–145)
Total Bilirubin: 0.6 mg/dL (ref 0.0–1.2)
Total Protein: 7.7 g/dL (ref 6.5–8.1)

## 2024-05-28 LAB — PHOSPHORUS: Phosphorus: 1.3 mg/dL — ABNORMAL LOW (ref 2.5–4.6)

## 2024-05-28 LAB — MAGNESIUM: Magnesium: 3.1 mg/dL — ABNORMAL HIGH (ref 1.7–2.4)

## 2024-05-28 MED ORDER — INSULIN ASPART 100 UNIT/ML IJ SOLN
0.0000 [IU] | INTRAMUSCULAR | Status: DC
  Administered 2024-05-28: 3 [IU] via SUBCUTANEOUS
  Administered 2024-05-28: 5 [IU] via SUBCUTANEOUS
  Administered 2024-05-28 (×3): 4 [IU] via SUBCUTANEOUS
  Administered 2024-05-29 (×2): 3 [IU] via SUBCUTANEOUS

## 2024-05-28 MED ORDER — SODIUM PHOSPHATES 45 MMOLE/15ML IV SOLN
30.0000 mmol | Freq: Once | INTRAVENOUS | Status: AC
  Administered 2024-05-28: 30 mmol via INTRAVENOUS
  Filled 2024-05-28: qty 10

## 2024-05-28 MED ORDER — INSULIN ASPART 100 UNIT/ML IJ SOLN
3.0000 [IU] | INTRAMUSCULAR | Status: DC
  Administered 2024-05-28 – 2024-05-29 (×7): 3 [IU] via SUBCUTANEOUS

## 2024-05-28 MED ORDER — INSULIN ASPART 100 UNIT/ML IJ SOLN
3.0000 [IU] | Freq: Once | INTRAMUSCULAR | Status: AC
  Administered 2024-05-28: 3 [IU] via SUBCUTANEOUS

## 2024-05-28 NOTE — Progress Notes (Signed)
 Nutrition Follow-up  DOCUMENTATION CODES:   Obesity unspecified  INTERVENTION:  - Continue current tube feeding via J-tube: Osmolite 1.2 at 60 ml/h (1440 ml per day) Prosource TF20 60 ml daily Provides 1808 kcal, 100 gm protein, 1181 ml free water  daily  - Monitor magnesium , potassium, and phosphorus, MD to replete as needed.  - Phosphorus has been low and continuing to decrease since 10/28, reached out to MD to add repletion.  - Continue 100mg  thiamine  x5 days (ends tomorrow).   - FWF per MD. Currently ordered 200mL Q4H ( ). In addition to tube feeds at goal, this is a total of 2366mL/day.    - Monitor weight trends.    NUTRITION DIAGNOSIS:   Inadequate oral intake related to inability to eat as evidenced by NPO status. *ongoing  GOAL:   Patient will meet greater than or equal to 90% of their needs *met with TF  MONITOR:   Labs, Weight trends, TF tolerance, Skin  REASON FOR ASSESSMENT:   Consult Assessment of nutrition requirement/status, Enteral/tube feeding initiation and management  ASSESSMENT:   73 y.o. female who is a nursing home resident with PMH of CVA, bedbound with a chronic J-tube, seizure disorder, advanced dementia, recently discharged 2 months ago for endocarditis. Presented from nursing home because of J-tube leakage, CT scan of the abdomen pelvis showed jejunostomy tube in place without any obvious complication. Admitted for sepsis and AKI  Patient reached goal tube feeds yesterday, no noted intolerances. At time of visit, TF running at goal of 46mL/hr. Patient awake, nodded head no when asked if any abdominal pain/issues. No family or visitors at bedside.    RN reached out to RD about high CBGs at the concern of MD. Per chart review, patient has not had any insulin  ordered since starting tube feeds. Diabetes coordinator adding insulin  regimen today.  Patient has been on formula Osmolite since starting tube feeds >2 years ago and tolerating well.  Will plan to continue current tube feed regimen for now and see how insulin  coverage helps with CBGs control. If issues persist, will plan to make changes at next follow up as indicated.  Patient is noted to be having type 7 BM's. However, she has been ordered scheduled Colace BID and still receiving. Discussed with MD about considering making colace prn if medically appropriate.   Of note, patient's phosphorus has been low since 10/28 and continuing to decrease. No repletion ordered. Reached out to MD about repletion and orders now in.   Admit weight: 209# Current weight: 228# I&O's: +7.2L since admit  Medications reviewed and include: Colace, MVI, 1mg  folic acid , 100mg  thiamine  x5 days (ends 11/1), Q4H FWF   Labs reviewed:  Na 131 HA1C 5.4 Blood Glucose 191-358 x24 hours  Latest Reference Range & Units 05/25/24 03:37 05/26/24 03:51 05/27/24 04:01 05/28/24 03:42  Phosphorus 2.5 - 4.6 mg/dL 2.2 (L) 2.0 (L) 1.7 (L) 1.3 (L)  (L): Data is abnormally low  Diet Order:   Diet Order             Diet NPO time specified  Diet effective now                   EDUCATION NEEDS:  Not appropriate for education at this time  Skin:  Skin Assessment: Skin Integrity Issues: Skin Integrity Issues:: DTI, Unstageable DTI: Buttocks Unstageable: Mid Coccyx  Last BM:  10/31 - type 7  Height:  Ht Readings from Last 1 Encounters:  05/20/24 5' 10 (1.778  m)   Weight:  Wt Readings from Last 1 Encounters:  05/28/24 103.8 kg   Ideal Body Weight:  68.18 kg  BMI:  Body mass index is 32.83 kg/m.  Estimated Nutritional Needs:  Kcal:  1700-1900 kcals Protein:  90-105 grams Fluid:  >/= 1.7L    Trude Ned RD, LDN Contact via Secure Chat.

## 2024-05-28 NOTE — Inpatient Diabetes Management (Signed)
 Inpatient Diabetes Program Recommendations  AACE/ADA: New Consensus Statement on Inpatient Glycemic Control (2015)  Target Ranges:  Prepandial:   less than 140 mg/dL      Peak postprandial:   less than 180 mg/dL (1-2 hours)      Critically ill patients:  140 - 180 mg/dL   Lab Results  Component Value Date   GLUCAP 358 (H) 05/28/2024   HGBA1C 5.4 11/26/2022    Review of Glycemic Control  Latest Reference Range & Units 05/27/24 07:42 05/27/24 11:51 05/27/24 16:02 05/27/24 21:22 05/28/24 00:02 05/28/24 04:21 05/28/24 08:07  Glucose-Capillary 70 - 99 mg/dL 794 (H) 808 (H) 748 (H) 235 (H) 312 (H) 298 (H) 358 (H)  (H): Data is abnormally high Diabetes history: Type 2 DM Outpatient Diabetes medications: none Current orders for Inpatient glycemic control: none; Novolog  3 units x 2 Osmolite @ 60 ml/hr  Inpatient Diabetes Program Recommendations:    Consider adding Novolog  0-6 units Q4H and Novolog  3 units Q4H for tube feed coverage (to be stopped or held in the event tube feeds stopped).   Thanks, Tinnie Minus, MSN, RNC-OB Diabetes Coordinator [phone] (8a-5p)

## 2024-05-28 NOTE — Progress Notes (Signed)
 PROGRESS NOTE    Kathleen Jensen  FMW:[id number] DOB: Dec 04, 1950 DOA: 05/19/2024 PCP: System, Provider Not In   Brief Narrative:  73 y.o. 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  She was sent from nursing home due to feeling tube leakage.  CT of the abdomen and pelvis showed jejunostomy tube in place without any obvious complication, adynamic ileus with initial lactic acid of 2.8, hemoglobin of 6.3, repeated hemoglobin was 12 with no leukocytosis.  She was admitted for sepsis secondary to cellulitis along with UTI.  She was initially started on IV antibiotics.  Urine cultures grew Pseudomonas: Antibiotics switched to ciprofloxacin.  Palliative care consulted for goals of care discussion.  Assessment & Plan:   Sepsis: Present on admission Pseudomonas UTI: Present on admission Jejunostomy tube site cellulitis -Imaging unremarkable. Interventional radiology was consulted they recommended to apply Desitin to skin surface, tape to tube to prevent slippage.  Okay to use feeding tube.  - Continued 7-day course of ciprofloxacin - She will need to continue on suppressive antibiotic therapy for endocarditis  - Has had low-grade temperatures for the last 24 hours but no temperature spike  Acute kidney injury - Resolved.  Diuretics on hold  Hypernatremia - Improved.  Continue free water  per tube  Hyponatremia - Mild  Acute metabolic acidosis - Mild.  Monitor  Hypophosphatemia - Replace  Leukocytosis - Resolved  History of endocarditis - In the setting of AICD currently on cephalexin.   Resume suppressive cefadroxil on discharge  Unspecified CVA Hyperlipidemia - Continue aspirin  and statin  History of seizure disorder -Continue Keppra  and valproic  acid  Paroxysmal A-fib - Currently rate controlled.  Continue amiodarone  and metoprolol   Essential hypertension--continue metoprolol   GERD Continue famotidine   Hypokalemia - Resolved  Thrombocytopenia - Resolved  Nutrition - Continue jejunostomy tube feeding as per dietitian recommendations.  Continue free water  per tube  Stage IV sacral decubitus ulcer: Present on admission - Continue wound care as per wound care consult recommendations  Goals of care - Overall prognosis is poor.  Patient remains full code.  Palliative care following.  Obesity class I - Outpatient follow-up   DVT prophylaxis: Lovenox  Code Status: Full Family Communication: None at bedside Disposition Plan: Status is: Inpatient Remains inpatient appropriate because: Of severity of illness.  Need for SNF placement    Consultants: Palliative care  Procedures: None  Antimicrobials:  Anti-infectives (From admission, onward)    Start     Dose/Rate Route Frequency Ordered Stop   05/24/24 0800  ciprofloxacin (CIPRO) tablet 500 mg        500 mg Per Tube 2 times daily 05/24/24 0248 05/27/24 2134   05/23/24 1200  cephALEXin (KEFLEX) 250 MG/5ML suspension 500 mg        500 mg Per Tube Every 6 hours 05/23/24 0755     05/23/24 1000  ciprofloxacin (CIPRO) tablet 500 mg  Status:  Discontinued        500 mg Oral 2 times daily 05/23/24 0731 05/24/24 0247   05/23/24 1000  cefadroxil (DURICEF) 500 MG/5ML suspension 1,000 mg  Status:  Discontinued        1,000 mg Oral Every 12 hours  05/23/24 0731 05/23/24 0755   05/22/24 0600  vancomycin  (VANCOREADY) IVPB 1500 mg/300 mL  Status:  Discontinued        1,500 mg 150 mL/hr over 120 Minutes Intravenous Every 24 hours 05/21/24 0814 05/21/24 1438   05/21/24 1530  piperacillin-tazobactam (ZOSYN) IVPB  3.375 g  Status:  Discontinued        3.375 g 12.5 mL/hr over 240 Minutes Intravenous Every 8 hours 05/21/24 1438 05/23/24 0731   05/21/24 0800  cefTRIAXone  (ROCEPHIN ) 2 g in sodium chloride  0.9 % 100 mL IVPB  Status:  Discontinued        2 g 200 mL/hr over 30 Minutes Intravenous Every 24 hours 05/20/24 0722 05/21/24 1438   05/21/24 0600  vancomycin  (VANCOREADY) IVPB 1250 mg/250 mL  Status:  Discontinued        1,250 mg 166.7 mL/hr over 90 Minutes Intravenous Every 24 hours 05/20/24 0724 05/21/24 0814   05/20/24 0645  vancomycin  (VANCOREADY) IVPB 2000 mg/400 mL        2,000 mg 200 mL/hr over 120 Minutes Intravenous  Once 05/20/24 0630 05/20/24 0921   05/20/24 0630  cefTRIAXone  (ROCEPHIN ) 1 g in sodium chloride  0.9 % 100 mL IVPB        1 g 200 mL/hr over 30 Minutes Intravenous  Once 05/20/24 0624 05/20/24 0724   05/19/24 2315  cefadroxil (DURICEF) capsule 1,000 mg  Status:  Discontinued        1,000 mg Per Tube Every 12 hours 05/19/24 2203 05/20/24 0626   05/19/24 1530  cefTRIAXone  (ROCEPHIN ) 1 g in sodium chloride  0.9 % 100 mL IVPB        1 g 200 mL/hr over 30 Minutes Intravenous  Once 05/19/24 1529 05/19/24 1739        Subjective: Patient seen and examined at bedside.  No fever, agitation, seizures reported. Objective: Vitals:   05/27/24 1225 05/27/24 2125 05/28/24 0202 05/28/24 0419  BP: (!) 140/80 (!) 156/87  (!) 106/57  Pulse: 72 97  65  Resp: (!) 24 17  16   Temp: 98.7 F (37.1 C) 99.8 F (37.7 C)  99.3 F (37.4 C)  TempSrc: Axillary Oral  Oral  SpO2: 100% 93%  100%  Weight:   103.8 kg   Height:        Intake/Output Summary (Last 24 hours) at 05/28/2024 0758 Last data filed at 05/28/2024 0600 Gross per 24 hour  Intake 2012.17 ml  Output 900 ml  Net 1112.17 ml   Filed Weights   05/26/24 0700 05/27/24 0426 05/28/24 0202  Weight: 100.1 kg 103.2 kg 103.8 kg    Examination:  General: No distress.  Remains on room air.  Chronically ill and deconditioned  looking. ENT/neck: No palpable neck masses or elevated JVD noted respiratory: Bilateral decreased breath sounds at bases with some scattered crackles  CVS: Rate mostly controlled; S1 and S2 are heard Abdominal: Soft, obese, nontender, remains distended; no organomegaly, bowel sounds are normally heard.  Feeding tube present. Extremities: No clubbing; bilateral lower extremity edema present  CNS: Extremely poor historian.  Hardly follows with commands.   Lymph: No obvious palpable lymphadenopathy Skin: No obvious petechiae/rashes psych: Could not assess because of her current mental status  musculoskeletal: No obvious joint tenderness/erythema    Data Reviewed: I have personally reviewed following labs and imaging studies  CBC: Recent Labs  Lab 05/27/24 0401 05/28/24 0342  WBC 14.0* 9.4  NEUTROABS 8.8* 7.2  HGB 9.9* 9.4*  HCT 32.0* 31.3*  MCV 113.1*  110.2*  PLT 165 118*   Basic Metabolic Panel: Recent Labs  Lab 05/23/24 0350 05/24/24 0312 05/25/24 0337 05/26/24 0351 05/27/24 0401 05/28/24 0342  NA 147* 145 143  --  135 131*  K 3.6 3.1* 4.5  --  3.8 4.3  CL 114* 113* 115*  --  108 104  CO2 24 25 21*  --  20* 19*  GLUCOSE 106* 92 106*  --  178* 356*  BUN 20 13 13   --  22 25*  CREATININE 0.78 0.58 0.72  --  0.97 0.76  CALCIUM  7.9* 7.9* 7.9*  --  7.5* 7.1*  MG  --   --  2.6* 2.4 2.7* 3.1*  PHOS  --   --  2.2* 2.0* 1.7* 1.3*   GFR: Estimated Creatinine Clearance: 81.7 mL/min (by C-G formula based on SCr of 0.76 mg/dL). Liver Function Tests: Recent Labs  Lab 05/28/24 0342  AST 45*  ALT 12  ALKPHOS 205*  BILITOT 0.6  PROT 7.7  ALBUMIN  1.7*    No results for input(s): LIPASE, AMYLASE in the last 168 hours. No results for input(s): AMMONIA in the last 168 hours. Coagulation Profile: No results for input(s): INR, PROTIME in the last 168 hours.  Cardiac Enzymes: No results for input(s): CKTOTAL, CKMB, CKMBINDEX, TROPONINI in the last 168  hours. BNP (last 3 results) No results for input(s): PROBNP in the last 8760 hours. HbA1C: No results for input(s): HGBA1C in the last 72 hours. CBG: Recent Labs  Lab 05/27/24 1151 05/27/24 1602 05/27/24 2122 05/28/24 0002 05/28/24 0421  GLUCAP 191* 251* 235* 312* 298*   Lipid Profile: No results for input(s): CHOL, HDL, LDLCALC, TRIG, CHOLHDL, LDLDIRECT in the last 72 hours. Thyroid  Function Tests: No results for input(s): TSH, T4TOTAL, FREET4, T3FREE, THYROIDAB in the last 72 hours. Anemia Panel: No results for input(s): VITAMINB12, FOLATE, FERRITIN, TIBC, IRON, RETICCTPCT in the last 72 hours. Sepsis Labs: No results for input(s): PROCALCITON, LATICACIDVEN in the last 168 hours.   Recent Results (from the past 240 hours)  Urine Culture     Status: Abnormal   Collection Time: 05/19/24  2:22 PM   Specimen: Urine, Random  Result Value Ref Range Status   Specimen Description   Final    URINE, RANDOM Performed at [facility], [address], [place], [place] [address]    Special Requests   Final    NONE Reflexed from [id number] Performed at [facility], [address], [place], [place] [address]    Culture >=100,000 COLONIES/mL PSEUDOMONAS AERUGINOSA (A)  Final   Report Status 05/21/2024 FINAL  Final   Organism ID, Bacteria PSEUDOMONAS AERUGINOSA (A)  Final      Susceptibility   Pseudomonas aeruginosa - MIC*    MEROPENEM 0.5 SENSITIVE Sensitive     CIPROFLOXACIN 0.12 SENSITIVE Sensitive     IMIPENEM 2 SENSITIVE Sensitive     PIP/TAZO Value in next row Sensitive      <=4 SENSITIVEThis is a modified FDA-approved test that has been validated and its performance characteristics determined by the reporting laboratory.  This laboratory is certified under the Clinical Laboratory Improvement Amendments CLIA as qualified to perform high complexity clinical laboratory testing.    CEFEPIME  Value in next  row Sensitive      <=4 SENSITIVEThis is a modified FDA-approved test that has been validated and its performance characteristics determined by the reporting laboratory.  This laboratory is certified under the Clinical Laboratory Improvement Amendments CLIA as qualified  to perform high complexity clinical laboratory testing.    CEFTAZIDIME/AVIBACTAM Value in next row Sensitive      <=4 SENSITIVEThis is a modified FDA-approved test that has been validated and its performance characteristics determined by the reporting laboratory.  This laboratory is certified under the Clinical Laboratory Improvement Amendments CLIA as qualified to perform high complexity clinical laboratory testing.    CEFTOLOZANE/TAZOBACTAM Value in next row Sensitive      <=4 SENSITIVEThis is a modified FDA-approved test that has been validated and its performance characteristics determined by the reporting laboratory.  This laboratory is certified under the Clinical Laboratory Improvement Amendments CLIA as qualified to perform high complexity clinical laboratory testing.    TOBRAMYCIN Value in next row Sensitive      <=4 SENSITIVEThis is a modified FDA-approved test that has been validated and its performance characteristics determined by the reporting laboratory.  This laboratory is certified under the Clinical Laboratory Improvement Amendments CLIA as qualified to perform high complexity clinical laboratory testing.    CEFTAZIDIME Value in next row Sensitive      <=4 SENSITIVEThis is a modified FDA-approved test that has been validated and its performance characteristics determined by the reporting laboratory.  This laboratory is certified under the Clinical Laboratory Improvement Amendments CLIA as qualified to perform high complexity clinical laboratory testing.    * >=100,000 COLONIES/mL PSEUDOMONAS AERUGINOSA  Blood Culture (routine x 2)     Status: None   Collection Time: 05/19/24  2:42 PM   Specimen: BLOOD  Result Value Ref  Range Status   Specimen Description   Final    BLOOD BLOOD LEFT ARM Performed at [facility], [address], [place], [place] [address]    Special Requests   Final    BOTTLES DRAWN AEROBIC ONLY Blood Culture results may not be optimal due to an inadequate volume of blood received in culture bottles Performed at [facility], [address] [place], [place] [address]    Culture   Final    NO GROWTH 5 DAYS Performed at [facility], [address], [place], [place] [address]    Report Status 05/24/2024 FINAL  Final  Blood Culture (routine x 2)     Status: None   Collection Time: 05/19/24  4:15 PM   Specimen: BLOOD  Result Value Ref Range Status   Specimen Description   Final    BLOOD LEFT ANTECUBITAL Performed at [facility], [address] [place], [place] [address]    Special Requests   Final    BOTTLES DRAWN AEROBIC AND ANAEROBIC Blood Culture adequate volume Performed at [facility], [address], [place], [place] [address]    Culture   Final    NO GROWTH 5 DAYS Performed at [facility], [address] [place], [place] [address]    Report Status 05/24/2024 FINAL  Final         Radiology Studies: DG Abd 1 View Result Date: 05/27/2024 EXAM: 1 VIEW XRAY OF THE ABDOMEN 05/27/2024 10:09:00 AM COMPARISON: CT 10 / 22 / 25 CLINICAL HISTORY: Abdominal pain FINDINGS: LINES, TUBES AND DEVICES: Catheter tubing projecting over left abdomen. BOWEL: Diffusely gas-filled, dilated loops of bowel throughout central abdomen, mainly reflecting large bowel, consistent with ileus. SOFT TISSUES: Fibroid calcifications in pelvis. No opaque urinary calculi. BONES: Lumbosacral fusion hardware present. IMPRESSION: 1. Dilated loops of large bowel throughout the central abdomen similar to prior CT and suggestive of chronic  adynamic ileus. Electronically signed by: Norman Gatlin MD 05/27/2024 01:56 PM EDT  RP Workstation: [id number]        Scheduled Meds:  sodium chloride    Intravenous Once   amiodarone   200 mg Per Tube Daily   aspirin   81 mg Per Tube QHS   cephALEXin  500 mg Per Tube Q6H   Chlorhexidine  Gluconate Cloth  6 each Topical Daily   cyclobenzaprine   2.5 mg Per Tube Q12H   docusate  100 mg Per Tube BID   enoxaparin  (LOVENOX ) injection  40 mg Subcutaneous Q24H   famotidine   40 mg Per Tube Daily   feeding supplement (PROSource TF20)  60 mL Per Tube Daily   folic acid   1 mg Per Tube Daily   free water   200 mL Per Tube Q4H   levETIRAcetam   1,000 mg Per Tube BID   liver oil-zinc  oxide   Topical BID   metoprolol  tartrate  25 mg Per Tube BID   multivitamin with minerals  1 tablet Per Tube Daily   simethicone   80 mg Per Tube TID   simvastatin   10 mg Per Tube QHS   sodium chloride  flush  3 mL Intravenous Q12H   thiamine   100 mg Per Tube Daily   valproic  acid  500 mg Per Tube TID with meals   And   valproic  acid  750 mg Per Tube QHS   Continuous Infusions:  feeding supplement (OSMOLITE 1.2 CAL) 60 mL/hr at 05/27/24 1855          Sophie Mao, MD [facility] 05/28/2024, 7:58 AM

## 2024-05-29 DIAGNOSIS — R6520 Severe sepsis without septic shock: Secondary | ICD-10-CM | POA: Diagnosis not present

## 2024-05-29 DIAGNOSIS — A419 Sepsis, unspecified organism: Secondary | ICD-10-CM | POA: Diagnosis not present

## 2024-05-29 LAB — BASIC METABOLIC PANEL WITH GFR
Anion gap: 4 — ABNORMAL LOW (ref 5–15)
BUN: 22 mg/dL (ref 8–23)
CO2: 23 mmol/L (ref 22–32)
Calcium: 6.8 mg/dL — ABNORMAL LOW (ref 8.9–10.3)
Chloride: 104 mmol/L (ref 98–111)
Creatinine, Ser: 0.59 mg/dL (ref 0.44–1.00)
GFR, Estimated: >60 mL/min (ref >60)
Glucose, Bld: 335 mg/dL — ABNORMAL HIGH (ref 70–99)
Potassium: 4.2 mmol/L (ref 3.5–5.1)
Sodium: 131 mmol/L — ABNORMAL LOW (ref 135–145)

## 2024-05-29 LAB — CBC WITH DIFFERENTIAL/PLATELET
Abs Immature Granulocytes: 0.06 K/uL (ref 0.00–0.07)
Basophils Absolute: 0 K/uL (ref 0.0–0.1)
Basophils Relative: 0 %
Eosinophils Absolute: 0 K/uL (ref 0.0–0.5)
Eosinophils Relative: 0 %
HCT: 32.1 % — ABNORMAL LOW (ref 36.0–46.0)
Hemoglobin: 9.6 g/dL — ABNORMAL LOW (ref 12.0–15.0)
Immature Granulocytes: 1 %
Lymphocytes Relative: 31 %
Lymphs Abs: 2.5 K/uL (ref 0.7–4.0)
MCH: 33.1 pg (ref 26.0–34.0)
MCHC: 29.9 g/dL — ABNORMAL LOW (ref 30.0–36.0)
MCV: 110.7 fL — ABNORMAL HIGH (ref 80.0–100.0)
Monocytes Absolute: 1.1 K/uL — ABNORMAL HIGH (ref 0.1–1.0)
Monocytes Relative: 13 %
Neutro Abs: 4.5 K/uL (ref 1.7–7.7)
Neutrophils Relative %: 55 %
Platelets: 104 K/uL — ABNORMAL LOW (ref 150–400)
RBC: 2.9 MIL/uL — ABNORMAL LOW (ref 3.87–5.11)
RDW: 14.2 % (ref 11.5–15.5)
WBC: 8.1 K/uL (ref 4.0–10.5)
nRBC: 0 % (ref 0.0–0.2)

## 2024-05-29 LAB — GLUCOSE, CAPILLARY
Glucose-Capillary: 299 mg/dL — ABNORMAL HIGH (ref 70–99)
Glucose-Capillary: 267 mg/dL — ABNORMAL HIGH (ref 70–99)
Glucose-Capillary: 264 mg/dL — ABNORMAL HIGH (ref 70–99)
Glucose-Capillary: 201 mg/dL — ABNORMAL HIGH (ref 70–99)
Glucose-Capillary: 180 mg/dL — ABNORMAL HIGH (ref 70–99)

## 2024-05-29 LAB — PHOSPHORUS: Phosphorus: 2.1 mg/dL — ABNORMAL LOW (ref 2.5–4.6)

## 2024-05-29 LAB — HEMOGLOBIN A1C
Hgb A1c MFr Bld: 5.7 % — ABNORMAL HIGH (ref 4.8–5.6)
Mean Plasma Glucose: 116.89 mg/dL

## 2024-05-29 LAB — MAGNESIUM: Magnesium: 3.2 mg/dL — ABNORMAL HIGH (ref 1.7–2.4)

## 2024-05-29 MED ORDER — INSULIN ASPART 100 UNIT/ML IJ SOLN
0.0000 [IU] | INTRAMUSCULAR | Status: DC
  Administered 2024-05-29: 7 [IU] via SUBCUTANEOUS
  Administered 2024-05-29: 11 [IU] via SUBCUTANEOUS
  Administered 2024-05-29 – 2024-05-30 (×4): 3 [IU] via SUBCUTANEOUS
  Administered 2024-05-30: 4 [IU] via SUBCUTANEOUS
  Administered 2024-05-30: 3 [IU] via SUBCUTANEOUS
  Administered 2024-05-30 – 2024-05-31 (×3): 4 [IU] via SUBCUTANEOUS
  Administered 2024-05-31 (×2): 3 [IU] via SUBCUTANEOUS

## 2024-05-29 MED ORDER — INSULIN ASPART 100 UNIT/ML IJ SOLN
5.0000 [IU] | INTRAMUSCULAR | Status: DC
  Administered 2024-05-29 – 2024-05-31 (×13): 5 [IU] via SUBCUTANEOUS

## 2024-05-29 MED ORDER — SODIUM PHOSPHATES 45 MMOLE/15ML IV SOLN
30.0000 mmol | Freq: Once | INTRAVENOUS | Status: AC
  Administered 2024-05-29: 30 mmol via INTRAVENOUS
  Filled 2024-05-29: qty 10

## 2024-05-29 NOTE — Progress Notes (Signed)
 PROGRESS NOTE    Kathleen Jensen  FMW:[id number] DOB: [birth year] DOA: 05/19/2024 PCP: System, Provider Not In   Brief Narrative:  73 y.o. 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  She was sent from nursing home due to feeling tube leakage.  CT of the abdomen and pelvis showed jejunostomy tube in place without any obvious complication, adynamic ileus with initial lactic acid of 2.8, hemoglobin of 6.3, repeated hemoglobin was 12 with no leukocytosis.  She was admitted for sepsis secondary to cellulitis along with UTI.  She was initially started on IV antibiotics.  Urine cultures grew Pseudomonas: Antibiotics switched to ciprofloxacin and patient has completed course.  Palliative care consulted for goals of care discussion.  Patient remains full code.  Overall prognosis is poor.  Assessment & Plan:   Sepsis: Present on admission Pseudomonas UTI: Present on admission Jejunostomy tube site cellulitis -Imaging unremarkable. Interventional radiology was consulted they recommended to apply Desitin to skin surface, tape to tube to prevent slippage.  Okay to use feeding tube.  - Completed 7-day course of ciprofloxacin - She will need to continue on suppressive antibiotic therapy for endocarditis  - No temperature spikes over the last 24 hours   acute kidney injury - Resolved.  Diuretics on hold  Hypernatremia - Improved.  Continue free water  per tube  Hyponatremia - Mild.  Monitor  Acute metabolic acidosis - Improved  Hypophosphatemia - Replace  Leukocytosis - Resolved  History of  endocarditis - In the setting of AICD currently on cephalexin.  Resume suppressive cefadroxil on discharge  Unspecified CVA Hyperlipidemia - Continue aspirin  and statin  History of seizure disorder -Continue Keppra  and valproic  acid  Paroxysmal A-fib - Currently rate controlled.  Continue amiodarone  and metoprolol   Essential hypertension--continue metoprolol   Diabetes mellitus type 2 with hyperglycemia - A1c 5.7.  Continue short acting insulin  with meals.  Continue CBGs with SSI.  GERD Continue famotidine   Hypokalemia - Resolved  Thrombocytopenia - Mild.  Monitor intermittently.  Nutrition - Continue jejunostomy tube feeding as per dietitian recommendations.  Continue free water  per tube  Stage IV sacral decubitus ulcer: Present on admission - Continue wound care as per wound care consult recommendations  Goals of care - Overall prognosis is poor.  Patient remains full code.  Palliative care following.  Obesity class I - Outpatient follow-up  Hypophosphatemia - Replace.   DVT prophylaxis: Lovenox  Code Status: Full Family Communication: None at bedside Disposition Plan: Status is: Inpatient Remains inpatient appropriate because: Of severity of illness.  Need for SNF placement    Consultants: Palliative care  Procedures: None  Antimicrobials:  Anti-infectives (From admission, onward)    Start     Dose/Rate Route Frequency Ordered Stop   05/24/24 0800  ciprofloxacin (CIPRO) tablet 500 mg        500 mg Per Tube 2 times daily 05/24/24 0248 05/27/24 2134   05/23/24 1200  cephALEXin (KEFLEX) 250 MG/5ML suspension 500 mg        500 mg Per Tube Every 6 hours 05/23/24 0755     05/23/24 1000  ciprofloxacin (CIPRO) tablet 500 mg  Status:  Discontinued  500 mg Oral 2 times daily 05/23/24 0731 05/24/24 0247   05/23/24 1000  cefadroxil (DURICEF) 500 MG/5ML suspension 1,000 mg  Status:  Discontinued        1,000 mg Oral Every 12 hours 05/23/24 0731 05/23/24  0755   05/22/24 0600  vancomycin  (VANCOREADY) IVPB 1500 mg/300 mL  Status:  Discontinued        1,500 mg 150 mL/hr over 120 Minutes Intravenous Every 24 hours 05/21/24 0814 05/21/24 1438   05/21/24 1530  piperacillin-tazobactam (ZOSYN) IVPB 3.375 g  Status:  Discontinued        3.375 g 12.5 mL/hr over 240 Minutes Intravenous Every 8 hours 05/21/24 1438 05/23/24 0731   05/21/24 0800  cefTRIAXone  (ROCEPHIN ) 2 g in sodium chloride  0.9 % 100 mL IVPB  Status:  Discontinued        2 g 200 mL/hr over 30 Minutes Intravenous Every 24 hours 05/20/24 0722 05/21/24 1438   05/21/24 0600  vancomycin  (VANCOREADY) IVPB 1250 mg/250 mL  Status:  Discontinued        1,250 mg 166.7 mL/hr over 90 Minutes Intravenous Every 24 hours 05/20/24 0724 05/21/24 0814   05/20/24 0645  vancomycin  (VANCOREADY) IVPB 2000 mg/400 mL        2,000 mg 200 mL/hr over 120 Minutes Intravenous  Once 05/20/24 0630 05/20/24 0921   05/20/24 0630  cefTRIAXone  (ROCEPHIN ) 1 g in sodium chloride  0.9 % 100 mL IVPB        1 g 200 mL/hr over 30 Minutes Intravenous  Once 05/20/24 0624 05/20/24 0724   05/19/24 2315  cefadroxil (DURICEF) capsule 1,000 mg  Status:  Discontinued        1,000 mg Per Tube Every 12 hours 05/19/24 2203 05/20/24 0626   05/19/24 1530  cefTRIAXone  (ROCEPHIN ) 1 g in sodium chloride  0.9 % 100 mL IVPB        1 g 200 mL/hr over 30 Minutes Intravenous  Once 05/19/24 1529 05/19/24 1739        Subjective: Patient seen and examined at bedside.  No seizures, fever or vomiting reported. Objective: Vitals:   05/28/24 1311 05/28/24 2040 05/29/24 0406 05/29/24 0409  BP: (!) 140/73 136/74 129/79   Pulse: 68 87 66   Resp: 20 14 14    Temp: (!) 97 F (36.1 C) 98.6 F (37 C) 98.3 F (36.8 C)   TempSrc:  Oral    SpO2: 96% 99% 100%   Weight:    105.9 kg  Height:        Intake/Output Summary (Last 24 hours) at 05/29/2024 0801 Last data filed at 05/29/2024 0452 Gross per 24 hour  Intake 3345.89 ml  Output 1500 ml  Net  1845.89 ml   Filed Weights   05/27/24 0426 05/28/24 0202 05/29/24 0409  Weight: 103.2 kg 103.8 kg 105.9 kg    Examination:  General: Currently on room air.  No acute distress.  Chronically ill and deconditioned looking. ENT/neck: No JVD elevation or palpable thyromegaly  respiratory: Decreased breath sounds at bases bilaterally with some crackles CVS: S1 and S2 heard; rate remains mostly controlled Abdominal: Soft, obese, nontender, continues to remain distended; no organomegaly, bowel sounds are heard.  Feeding tube is present. Extremities: Lower extremity edema present bilaterally; no cyanosis CNS: Very poor historian.  Wakes up very slightly. Lymph: No lymphadenopathy palpable  skin: No obvious ecchymosis/lesions  psych: Cannot assess because of mental status  musculoskeletal: No obvious joint swelling/deformity   Data Reviewed: I have personally reviewed following labs  and imaging studies  CBC: Recent Labs  Lab 05/27/24 0401 05/28/24 0342 05/29/24 0335  WBC 14.0* 9.4 8.1  NEUTROABS 8.8* 7.2 4.5  HGB 9.9* 9.4* 9.6*  HCT 32.0* 31.3* 32.1*  MCV 113.1* 110.2* 110.7*  PLT 165 118* 104*   Basic Metabolic Panel: Recent Labs  Lab 05/24/24 0312 05/25/24 0337 05/26/24 0351 05/27/24 0401 05/28/24 0342 05/29/24 0335  NA 145 143  --  135 131* 131*  K 3.1* 4.5  --  3.8 4.3 4.2  CL 113* 115*  --  108 104 104  CO2 25 21*  --  20* 19* 23  GLUCOSE 92 106*  --  178* 356* 335*  BUN 13 13  --  22 25* 22  CREATININE 0.58 0.72  --  0.97 0.76 0.59  CALCIUM  7.9* 7.9*  --  7.5* 7.1* 6.8*  MG  --  2.6* 2.4 2.7* 3.1* 3.2*  PHOS  --  2.2* 2.0* 1.7* 1.3* 2.1*   GFR: Estimated Creatinine Clearance: 82.6 mL/min (by C-G formula based on SCr of 0.59 mg/dL). Liver Function Tests: Recent Labs  Lab 05/28/24 0342  AST 45*  ALT 12  ALKPHOS 205*  BILITOT 0.6  PROT 7.7  ALBUMIN  1.7*    No results for input(s): LIPASE, AMYLASE in the last 168 hours. No results for input(s):  AMMONIA in the last 168 hours. Coagulation Profile: No results for input(s): INR, PROTIME in the last 168 hours.  Cardiac Enzymes: No results for input(s): CKTOTAL, CKMB, CKMBINDEX, TROPONINI in the last 168 hours. BNP (last 3 results) No results for input(s): PROBNP in the last 8760 hours. HbA1C: Recent Labs    05/29/24 0335  HGBA1C 5.7*   CBG: Recent Labs  Lab 05/28/24 1628 05/28/24 2104 05/28/24 2336 05/29/24 0403 05/29/24 0730  GLUCAP 310* 329* 297* 299* 267*   Lipid Profile: No results for input(s): CHOL, HDL, LDLCALC, TRIG, CHOLHDL, LDLDIRECT in the last 72 hours. Thyroid  Function Tests: No results for input(s): TSH, T4TOTAL, FREET4, T3FREE, THYROIDAB in the last 72 hours. Anemia Panel: No results for input(s): VITAMINB12, FOLATE, FERRITIN, TIBC, IRON, RETICCTPCT in the last 72 hours. Sepsis Labs: No results for input(s): PROCALCITON, LATICACIDVEN in the last 168 hours.   Recent Results (from the past 240 hours)  Urine Culture     Status: Abnormal   Collection Time: 05/19/24  2:22 PM   Specimen: Urine, Random  Result Value Ref Range Status   Specimen Description   Final    URINE, RANDOM Performed at [facility], [address], [place], [place] [address]    Special Requests   Final    NONE Reflexed from [phone] Performed at [facility], [address], [place], [place] [address]    Culture >=100,000 COLONIES/mL PSEUDOMONAS AERUGINOSA (A)  Final   Report Status 05/21/2024 FINAL  Final   Organism ID, Bacteria PSEUDOMONAS AERUGINOSA (A)  Final      Susceptibility   Pseudomonas aeruginosa - MIC*    MEROPENEM 0.5 SENSITIVE Sensitive     CIPROFLOXACIN 0.12 SENSITIVE Sensitive     IMIPENEM 2 SENSITIVE Sensitive     PIP/TAZO Value in next row Sensitive      <=4 SENSITIVEThis is a modified FDA-approved test that has been validated and its performance characteristics  determined by the reporting laboratory.  This laboratory is certified under the Clinical Laboratory Improvement Amendments CLIA as qualified to perform high complexity clinical laboratory testing.    CEFEPIME  Value in next row Sensitive      <=  4 SENSITIVEThis is a modified FDA-approved test that has been validated and its performance characteristics determined by the reporting laboratory.  This laboratory is certified under the Clinical Laboratory Improvement Amendments CLIA as qualified to perform high complexity clinical laboratory testing.    CEFTAZIDIME/AVIBACTAM Value in next row Sensitive      <=4 SENSITIVEThis is a modified FDA-approved test that has been validated and its performance characteristics determined by the reporting laboratory.  This laboratory is certified under the Clinical Laboratory Improvement Amendments CLIA as qualified to perform high complexity clinical laboratory testing.    CEFTOLOZANE/TAZOBACTAM Value in next row Sensitive      <=4 SENSITIVEThis is a modified FDA-approved test that has been validated and its performance characteristics determined by the reporting laboratory.  This laboratory is certified under the Clinical Laboratory Improvement Amendments CLIA as qualified to perform high complexity clinical laboratory testing.    TOBRAMYCIN Value in next row Sensitive      <=4 SENSITIVEThis is a modified FDA-approved test that has been validated and its performance characteristics determined by the reporting laboratory.  This laboratory is certified under the Clinical Laboratory Improvement Amendments CLIA as qualified to perform high complexity clinical laboratory testing.    CEFTAZIDIME Value in next row Sensitive      <=4 SENSITIVEThis is a modified FDA-approved test that has been validated and its performance characteristics determined by the reporting laboratory.  This laboratory is certified under the Clinical Laboratory Improvement Amendments CLIA as qualified to  perform high complexity clinical laboratory testing.    * >=100,000 COLONIES/mL PSEUDOMONAS AERUGINOSA  Blood Culture (routine x 2)     Status: None   Collection Time: 05/19/24  2:42 PM   Specimen: BLOOD  Result Value Ref Range Status   Specimen Description   Final    BLOOD BLOOD LEFT ARM Performed at [facility], [address], [place], [place] [address]    Special Requests   Final    BOTTLES DRAWN AEROBIC ONLY Blood Culture results may not be optimal due to an inadequate volume of blood received in culture bottles Performed at [facility], [address] [place], [place] [address]    Culture   Final    NO GROWTH 5 DAYS Performed at [facility], [address] [place], [place] [address]    Report Status 05/24/2024 FINAL  Final  Blood Culture (routine x 2)     Status: None   Collection Time: 05/19/24  4:15 PM   Specimen: BLOOD  Result Value Ref Range Status   Specimen Description   Final    BLOOD LEFT ANTECUBITAL Performed at [facility], [address] [place] [address], [place], [place] [address]    Special Requests   Final    BOTTLES DRAWN AEROBIC AND ANAEROBIC Blood Culture adequate volume Performed at [facility], [address], [place], [place] [address]    Culture   Final    NO GROWTH 5 DAYS Performed at [facility], [address], [place], [place] [address]    Report Status 05/24/2024 FINAL  Final         Radiology Studies: DG Abd 1 View Result Date: 05/27/2024 EXAM: 1 VIEW XRAY OF THE ABDOMEN 05/27/2024 10:09:00 AM COMPARISON: CT 10 / 22 / 25 CLINICAL HISTORY: Abdominal pain FINDINGS: LINES, TUBES AND DEVICES: Catheter tubing projecting over left abdomen. BOWEL: Diffusely gas-filled, dilated loops of bowel throughout central abdomen, mainly reflecting large bowel, consistent with ileus.  SOFT TISSUES: Fibroid calcifications in pelvis. No opaque urinary calculi. BONES: Lumbosacral  fusion hardware present. IMPRESSION: 1. Dilated loops of large bowel throughout the central abdomen similar to prior CT and suggestive of chronic adynamic ileus. Electronically signed by: Norman Gatlin MD 05/27/2024 01:56 PM EDT RP Workstation: [id number]        Scheduled Meds:  sodium chloride    Intravenous Once   amiodarone   200 mg Per Tube Daily   aspirin   81 mg Per Tube QHS   cephALEXin  500 mg Per Tube Q6H   Chlorhexidine  Gluconate Cloth  6 each Topical Daily   cyclobenzaprine   2.5 mg Per Tube Q12H   docusate  100 mg Per Tube BID   enoxaparin  (LOVENOX ) injection  40 mg Subcutaneous Q24H   famotidine   40 mg Per Tube Daily   feeding supplement (PROSource TF20)  60 mL Per Tube Daily   folic acid   1 mg Per Tube Daily   free water   200 mL Per Tube Q4H   insulin  aspart  0-6 Units Subcutaneous Q4H   insulin  aspart  3 Units Subcutaneous Q4H   levETIRAcetam   1,000 mg Per Tube BID   liver oil-zinc  oxide   Topical BID   metoprolol  tartrate  25 mg Per Tube BID   multivitamin with minerals  1 tablet Per Tube Daily   simethicone   80 mg Per Tube TID   simvastatin   10 mg Per Tube QHS   sodium chloride  flush  3 mL Intravenous Q12H   thiamine   100 mg Per Tube Daily   valproic  acid  500 mg Per Tube TID with meals   And   valproic  acid  750 mg Per Tube QHS   Continuous Infusions:  feeding supplement (OSMOLITE 1.2 CAL) 60 mL/hr at 05/28/24 1827          Sophie Mao, MD [facility] 05/29/2024, 8:01 AM

## 2024-05-30 DIAGNOSIS — A419 Sepsis, unspecified organism: Secondary | ICD-10-CM | POA: Diagnosis not present

## 2024-05-30 DIAGNOSIS — R6520 Severe sepsis without septic shock: Secondary | ICD-10-CM | POA: Diagnosis not present

## 2024-05-30 LAB — GLUCOSE, CAPILLARY
Glucose-Capillary: 127 mg/dL — ABNORMAL HIGH (ref 70–99)
Glucose-Capillary: 145 mg/dL — ABNORMAL HIGH (ref 70–99)
Glucose-Capillary: 148 mg/dL — ABNORMAL HIGH (ref 70–99)
Glucose-Capillary: 148 mg/dL — ABNORMAL HIGH (ref 70–99)
Glucose-Capillary: 150 mg/dL — ABNORMAL HIGH (ref 70–99)
Glucose-Capillary: 161 mg/dL — ABNORMAL HIGH (ref 70–99)
Glucose-Capillary: 172 mg/dL — ABNORMAL HIGH (ref 70–99)

## 2024-05-30 MED ORDER — SIMETHICONE 40 MG/0.6ML PO SUSP
90.0000 mg | Freq: Three times a day (TID) | ORAL | Status: DC
  Filled 2024-05-30: qty 1.8

## 2024-05-30 MED ORDER — SIMETHICONE 40 MG/0.6ML PO SUSP
80.0000 mg | Freq: Three times a day (TID) | ORAL | Status: DC
Start: 2024-05-30 — End: 2024-05-31
  Administered 2024-05-30 – 2024-05-31 (×3): 80 mg
  Filled 2024-05-30 (×4): qty 1.2

## 2024-05-30 NOTE — Plan of Care (Signed)

## 2024-05-30 NOTE — Plan of Care (Signed)
 ?  Problem: Clinical Measurements: ?Goal: Ability to maintain clinical measurements within normal limits will improve ?Outcome: Progressing ?Goal: Will remain free from infection ?Outcome: Progressing ?Goal: Diagnostic test results will improve ?Outcome: Progressing ?  ?

## 2024-05-30 NOTE — Progress Notes (Addendum)
 PROGRESS NOTE    Kathleen Jensen  FMW:[id number] DOB: 1950-11-15 DOA: 05/19/2024 PCP: System, Provider Not In   Brief Narrative:  73 y.o. 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  She was sent from nursing home due to feeling tube leakage.  CT of the abdomen and pelvis showed jejunostomy tube in place without any obvious complication, adynamic ileus with initial lactic acid of 2.8, hemoglobin of 6.3, repeated hemoglobin was 12 with no leukocytosis.  She was admitted for sepsis secondary to cellulitis along with UTI.  She was initially started on IV antibiotics.  Urine cultures grew Pseudomonas: Antibiotics switched to ciprofloxacin and patient has completed course.  Palliative care consulted for goals of care discussion.  Patient remains full code.  Overall prognosis is poor.  Assessment & Plan:   Sepsis: Present on admission Pseudomonas UTI: Present on admission; possibly associated with chronic indwelling Foley catheter Jejunostomy tube site cellulitis -Imaging unremarkable. Interventional radiology was consulted they recommended to apply Desitin to skin surface, tape to tube to prevent slippage.  Okay to use feeding tube.  - Completed 7-day course of ciprofloxacin - She will need to continue on suppressive antibiotic therapy for endocarditis  - No temperature spikes over the last 48 hours   acute kidney injury - Resolved.  Diuretics on hold  Hypernatremia - Improved.  Continue free water  per tube  Hyponatremia - Mild.  Monitor intermittently  Acute metabolic acidosis -  Improved  Hypophosphatemia - No labs today.  Leukocytosis - Resolved  History of endocarditis - In the setting of AICD; currently on cephalexin.  Resume suppressive cefadroxil on discharge  Unspecified CVA Hyperlipidemia - Continue aspirin  and statin  History of seizure disorder -Continue Keppra  and valproic  acid  Paroxysmal A-fib - Currently rate controlled.  Continue amiodarone  and metoprolol   Essential hypertension--continue metoprolol   Diabetes mellitus type 2 with hyperglycemia - A1c 5.7.  Continue short acting insulin  with meals.  Continue CBGs with SSI.  GERD -Continue famotidine   Hypokalemia - Resolved  Thrombocytopenia - Mild.  Monitor intermittently.  Nutrition - Continue jejunostomy tube feeding as per dietitian recommendations.  Continue free water  per tube  Stage IV sacral decubitus ulcer: Present on admission - Continue wound care as per wound care consult recommendations  Goals of care - Overall prognosis is poor.  Patient remains full code.  Palliative care following.  Obesity class I - Outpatient follow-up   DVT prophylaxis: Lovenox  Code Status: Full Family Communication: None at bedside Disposition Plan: Status is: Inpatient Remains inpatient appropriate because: Of severity of illness.  Need for SNF placement    Consultants: Palliative care  Procedures: None  Antimicrobials:  Anti-infectives (From admission, onward)    Start     Dose/Rate Route Frequency Ordered Stop   05/24/24 0800  ciprofloxacin (CIPRO) tablet 500 mg        500 mg Per Tube 2 times daily 05/24/24 0248 05/27/24 2134   05/23/24 1200  cephALEXin (KEFLEX) 250 MG/5ML suspension 500 mg        500 mg Per Tube Every 6 hours 05/23/24 0755     05/23/24 1000  ciprofloxacin (CIPRO) tablet 500 mg  Status:  Discontinued  500 mg Oral 2 times daily 05/23/24 0731 05/24/24 0247   05/23/24 1000  cefadroxil (DURICEF) 500 MG/5ML suspension 1,000 mg  Status:  Discontinued         1,000 mg Oral Every 12 hours 05/23/24 0731 05/23/24 0755   05/22/24 0600  vancomycin  (VANCOREADY) IVPB 1500 mg/300 mL  Status:  Discontinued        1,500 mg 150 mL/hr over 120 Minutes Intravenous Every 24 hours 05/21/24 0814 05/21/24 1438   05/21/24 1530  piperacillin-tazobactam (ZOSYN) IVPB 3.375 g  Status:  Discontinued        3.375 g 12.5 mL/hr over 240 Minutes Intravenous Every 8 hours 05/21/24 1438 05/23/24 0731   05/21/24 0800  cefTRIAXone  (ROCEPHIN ) 2 g in sodium chloride  0.9 % 100 mL IVPB  Status:  Discontinued        2 g 200 mL/hr over 30 Minutes Intravenous Every 24 hours 05/20/24 0722 05/21/24 1438   05/21/24 0600  vancomycin  (VANCOREADY) IVPB 1250 mg/250 mL  Status:  Discontinued        1,250 mg 166.7 mL/hr over 90 Minutes Intravenous Every 24 hours 05/20/24 0724 05/21/24 0814   05/20/24 0645  vancomycin  (VANCOREADY) IVPB 2000 mg/400 mL        2,000 mg 200 mL/hr over 120 Minutes Intravenous  Once 05/20/24 0630 05/20/24 0921   05/20/24 0630  cefTRIAXone  (ROCEPHIN ) 1 g in sodium chloride  0.9 % 100 mL IVPB        1 g 200 mL/hr over 30 Minutes Intravenous  Once 05/20/24 0624 05/20/24 0724   05/19/24 2315  cefadroxil (DURICEF) capsule 1,000 mg  Status:  Discontinued        1,000 mg Per Tube Every 12 hours 05/19/24 2203 05/20/24 0626   05/19/24 1530  cefTRIAXone  (ROCEPHIN ) 1 g in sodium chloride  0.9 % 100 mL IVPB        1 g 200 mL/hr over 30 Minutes Intravenous  Once 05/19/24 1529 05/19/24 1739        Subjective: Patient seen and examined at bedside.  No agitation, fever or vomiting reported Objective: Vitals:   05/29/24 1435 05/29/24 2222 05/30/24 0419 05/30/24 0500  BP: (!) 145/85 (!) 137/90 107/68   Pulse: 67 60 68   Resp: 18 18 20    Temp: 98.6 F (37 C) 99 F (37.2 C) 98.7 F (37.1 C)   TempSrc:   Oral   SpO2: 97%  99%   Weight:    105.9 kg  Height:        Intake/Output Summary (Last 24 hours) at 05/30/2024 0801 Last data filed at 05/30/2024 0752 Gross per  24 hour  Intake 1744.44 ml  Output 725 ml  Net 1019.44 ml   Filed Weights   05/28/24 0202 05/29/24 0409 05/30/24 0500  Weight: 103.8 kg 105.9 kg 105.9 kg    Examination:  General: No acute distress.  Remains on room air.  Chronically ill and deconditioned looking. ENT/neck: No neck masses or elevated JVD noted  respiratory: Bilateral decreased breath sounds at bases with scattered crackles CVS: Currently rate controlled; S1 and S2 are heard  abdominal: Soft, obese, nontender, still distended; no organomegaly, bowel sounds are heard normally.  Has a feeding tube  extremities: No clubbing; mild lower extremity edema present CNS: Extremely poor historian.  Hardly follows simple commands  lymph: No palpable lymphadenopathy noted  skin: No obvious rashes/ecchymosis psych: Could not be assessed because of patient's current mental status  musculoskeletal: No obvious joint tenderness/erythema   Data  Reviewed: I have personally reviewed following labs and imaging studies  CBC: Recent Labs  Lab 05/27/24 0401 05/28/24 0342 05/29/24 0335  WBC 14.0* 9.4 8.1  NEUTROABS 8.8* 7.2 4.5  HGB 9.9* 9.4* 9.6*  HCT 32.0* 31.3* 32.1*  MCV 113.1* 110.2* 110.7*  PLT 165 118* 104*   Basic Metabolic Panel: Recent Labs  Lab 05/24/24 0312 05/25/24 0337 05/26/24 0351 05/27/24 0401 05/28/24 0342 05/29/24 0335  NA 145 143  --  135 131* 131*  K 3.1* 4.5  --  3.8 4.3 4.2  CL 113* 115*  --  108 104 104  CO2 25 21*  --  20* 19* 23  GLUCOSE 92 106*  --  178* 356* 335*  BUN 13 13  --  22 25* 22  CREATININE 0.58 0.72  --  0.97 0.76 0.59  CALCIUM  7.9* 7.9*  --  7.5* 7.1* 6.8*  MG  --  2.6* 2.4 2.7* 3.1* 3.2*  PHOS  --  2.2* 2.0* 1.7* 1.3* 2.1*   GFR: Estimated Creatinine Clearance: 82.6 mL/min (by C-G formula based on SCr of 0.59 mg/dL). Liver Function Tests: Recent Labs  Lab 05/28/24 0342  AST 45*  ALT 12  ALKPHOS 205*  BILITOT 0.6  PROT 7.7  ALBUMIN  1.7*    No results for input(s):  LIPASE, AMYLASE in the last 168 hours. No results for input(s): AMMONIA in the last 168 hours. Coagulation Profile: No results for input(s): INR, PROTIME in the last 168 hours.  Cardiac Enzymes: No results for input(s): CKTOTAL, CKMB, CKMBINDEX, TROPONINI in the last 168 hours. BNP (last 3 results) No results for input(s): PROBNP in the last 8760 hours. HbA1C: Recent Labs    05/29/24 0335  HGBA1C 5.7*   CBG: Recent Labs  Lab 05/29/24 1623 05/29/24 1930 05/30/24 0012 05/30/24 0424 05/30/24 0722  GLUCAP 201* 180* 172* 161* 127*   Lipid Profile: No results for input(s): CHOL, HDL, LDLCALC, TRIG, CHOLHDL, LDLDIRECT in the last 72 hours. Thyroid  Function Tests: No results for input(s): TSH, T4TOTAL, FREET4, T3FREE, THYROIDAB in the last 72 hours. Anemia Panel: No results for input(s): VITAMINB12, FOLATE, FERRITIN, TIBC, IRON, RETICCTPCT in the last 72 hours. Sepsis Labs: No results for input(s): PROCALCITON, LATICACIDVEN in the last 168 hours.   No results found for this or any previous visit (from the past 240 hours).        Radiology Studies: No results found.       Scheduled Meds:  sodium chloride    Intravenous Once   amiodarone   200 mg Per Tube Daily   aspirin   81 mg Per Tube QHS   cephALEXin  500 mg Per Tube Q6H   Chlorhexidine  Gluconate Cloth  6 each Topical Daily   cyclobenzaprine   2.5 mg Per Tube Q12H   docusate  100 mg Per Tube BID   enoxaparin  (LOVENOX ) injection  40 mg Subcutaneous Q24H   famotidine   40 mg Per Tube Daily   feeding supplement (PROSource TF20)  60 mL Per Tube Daily   folic acid   1 mg Per Tube Daily   free water   200 mL Per Tube Q4H   insulin  aspart  0-20 Units Subcutaneous Q4H   insulin  aspart  5 Units Subcutaneous Q4H   levETIRAcetam   1,000 mg Per Tube BID   liver oil-zinc  oxide   Topical BID   metoprolol  tartrate  25 mg Per Tube BID   multivitamin with minerals  1  tablet Per Tube Daily   simethicone   80 mg Per  Tube TID   simvastatin   10 mg Per Tube QHS   sodium chloride  flush  3 mL Intravenous Q12H   valproic  acid  500 mg Per Tube TID with meals   And   valproic  acid  750 mg Per Tube QHS   Continuous Infusions:  feeding supplement (OSMOLITE 1.2 CAL) 60 mL/hr at 05/28/24 1827          Sophie Mao, MD [facility] 05/30/2024, 8:01 AM

## 2024-05-31 DIAGNOSIS — A419 Sepsis, unspecified organism: Secondary | ICD-10-CM | POA: Diagnosis not present

## 2024-05-31 DIAGNOSIS — R6520 Severe sepsis without septic shock: Secondary | ICD-10-CM | POA: Diagnosis not present

## 2024-05-31 LAB — GLUCOSE, CAPILLARY
Glucose-Capillary: 153 mg/dL — ABNORMAL HIGH (ref 70–99)
Glucose-Capillary: 162 mg/dL — ABNORMAL HIGH (ref 70–99)
Glucose-Capillary: 200 mg/dL — ABNORMAL HIGH (ref 70–99)

## 2024-05-31 MED ORDER — OSMOLITE 1.2 CAL PO LIQD: 1000.0000 mL | ORAL | Status: DC

## 2024-05-31 MED ORDER — PROSOURCE TF20 ENFIT COMPATIBL EN LIQD: 60.0000 mL | Freq: Every day | ENTERAL | Status: DC

## 2024-05-31 MED ORDER — FREE WATER: 200.0000 mL | Status: DC

## 2024-05-31 MED ORDER — ZINC OXIDE 40 % EX OINT: TOPICAL_OINTMENT | Freq: Two times a day (BID) | CUTANEOUS | Status: DC

## 2024-05-31 NOTE — NC FL2 (Signed)
 [place]  MEDICAID FL2 LEVEL OF CARE FORM     IDENTIFICATION  Patient Name: Kathleen Jensen Birthdate: 10/20/1950 Sex: female Admission Date (Current Location): 05/19/2024  [facility] and [place] Number:  Producer, Television/film/video and Address:  [employer],  [address], [place] [address]      Provider Number: [id number]  Attending Physician Name and Address:  Cheryle Page, MD  Relative Name and Phone Number:  [name] (Legal Guardian)  [phone] (Mobile)    Current Level of Care: Hospital Recommended Level of Care: Nursing Facility [facility]) Prior Approval Number:    Date Approved/Denied:   PASRR Number: [id number]  Discharge Plan: Home [facility])    Current Diagnoses: Patient Active Problem List   Diagnosis Date Noted   Palliative care encounter 05/21/2024   Goals of care, counseling/discussion 05/21/2024   Counseling and coordination of care 05/21/2024   Need for emotional support 05/21/2024   Bedbound 05/21/2024   Complication of feeding tube (HCC) 05/21/2024   Sepsis (HCC) 05/20/2024   Elevated lactic acid level 05/19/2024   Hypoglycemia 04/01/2024   Infection and inflammatory reaction due to cardiac device, implant, and graft 04/01/2024   Hypocalcemia 03/31/2024   PEG tube malfunction (HCC) 03/31/2024   Bacteremia 03/25/2024   Chronic health problem 03/25/2024   Ileus (HCC) 06/06/2023   Acute cystitis 03/01/2023   Delirium secondary to UTI 03/01/2023   Atrial fibrillation, chronic (HCC) 03/01/2023   History of seizure 03/01/2023   Chronic hyponatremia 03/01/2023   Acute respiratory failure (HCC) 11/16/2022   Abdominal distension 11/16/2022   AMS (altered mental status) 05/17/2022   Altered mental status 05/15/2022   Sacral wound 05/15/2022   Intracranial hemorrhage (HCC)    Pressure injury of skin 02/18/2022   Chronic indwelling Foley catheter 02/18/2022   PAF (paroxysmal atrial fibrillation) (HCC)  02/18/2022   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HCC) 02/18/2022   Septic shock (HCC) 02/15/2022   Constipation    Abdominal pain    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   History of right hemiplegia (HCC) 04/12/2020   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   Hyperlipidemia associated with type 2 diabetes mellitus (HCC) 07/30/2018   History of CVA (cerebrovascular accident) 07/30/2018   Vitamin B 12 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/30/2018   Asthma 03/13/2018   Seizures (HCC) 01/12/2018   CVA (cerebral vascular accident) (HCC) 12/08/2017   Abnormal thyroid  blood test 12/08/2017   Pulmonary embolism and infarction (HCC) 12/08/2017   Chronic anemia 12/08/2017   History of expressive aphasia 10/15/2017   Seizure disorder (HCC) 10/15/2017   Dysphagia 10/15/2017   Hyperlipidemia 09/22/2017   Urinary frequency 09/22/2017   Preoperative clearance 11/19/2016   Airway hyperreactivity 01/26/2016   Disease of thyroid  gland 01/26/2016   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   Other specified postprocedural states 01/26/2016   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   Chronic low back pain 04/10/2015   Degeneration of intervertebral disc of lumbar region 04/10/2015   Spondylolisthesis of lumbar region 04/10/2015   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   History of cardiac pacemaker in situ 01/09/2015   B12 DEFICIENCY 08/15/2008   Vitamin D  deficiency 08/15/2008   Sick sinus syndrome (HCC) 08/15/2008   OBSTRUCTIVE SLEEP APNEA 08/11/2008   INSOMNIA 04/28/2008   Hyperlipidemia LDL goal <70 12/12/2006   RHINITIS, ALLERGIC NEC 12/12/2006   Hypothyroidism 12/11/2006   Essential hypertension 12/11/2006   GERD 12/11/2006   STRESS INCONTINENCE 12/11/2006    Orientation RESPIRATION BLADDER Height & Weight  Normal Indwelling catheter, Incontinent Weight: 109.9 kg Height:  5' 10 (177.8 cm)  BEHAVIORAL SYMPTOMS/MOOD NEUROLOGICAL BOWEL NUTRITION STATUS     Convulsions/Seizures Incontinent Feeding tube (J-tube feeds)  AMBULATORY STATUS COMMUNICATION OF NEEDS Skin   Total Care Non-Verbally (Aphasia) Other (Comment) (Abdominal wound w/gauze dressing,)                       Personal Care Assistance Level of Assistance  Bathing, Feeding, Dressing Bathing Assistance: Maximum assistance Feeding assistance: Maximum assistance Dressing Assistance: Maximum assistance     Functional Limitations Info  Sight, Hearing, Speech Sight Info: Adequate Hearing Info: Adequate Speech Info: Impaired    SPECIAL CARE FACTORS FREQUENCY                       Contractures Contractures Info: Not present    Additional Factors Info  Code Status, Allergies, Psychotropic Code Status Info: Full code Allergies Info: Tape, Demerol (Meperidine Hcl), Sulfonamide Derivatives, Bacitracin-polymyxin B, Oxycontin  (Oxycodone ) Psychotropic Info: N/A         Current Medications (05/31/2024):  This is the current hospital active medication list Current Facility-Administered Medications  Medication Dose Route Frequency Provider Last Rate Last Admin   0.9 %  sodium chloride  infusion (Manually program via Guardrails IV Fluids)   Intravenous Once Randol Simmonds, MD       acetaminophen  (TYLENOL ) suppository 650 mg  650 mg Rectal Q6H PRN Odell Celinda Balo, MD       Or   acetaminophen  (TYLENOL ) 160 MG/5ML solution 650 mg  650 mg Per Tube Q6H PRN Odell Celinda Balo, MD   650 mg at 05/31/24 0030   albuterol  (PROVENTIL ) (2.5 MG/3ML) 0.083% nebulizer solution 2.5 mg  2.5 mg Nebulization Q2H PRN Arthea Child, MD       amiodarone  (PACERONE ) tablet 200 mg  200 mg Per Tube Daily Claiborne, Claudia, MD   200 mg at 05/31/24 9093   aspirin  chewable tablet 81 mg  81 mg Per Tube QHS Odell Celinda Balo, MD   81 mg at 05/30/24 2153   cephALEXin (KEFLEX) 250 MG/5ML suspension 500 mg  500 mg Per Tube Q6H Odell Celinda Balo, MD   500 mg at 05/31/24 0524   Chlorhexidine   Gluconate Cloth 2 % PADS 6 each  6 each Topical Daily Odell Celinda Balo, MD   6 each at 05/31/24 0910   cyclobenzaprine  (FLEXERIL ) tablet 2.5 mg  2.5 mg Per Tube Q12H Claiborne, Claudia, MD   2.5 mg at 05/31/24 0910   diazepam  (DIASTAT ) rectal kit 2.5 mg  2.5 mg Rectal PRN Carolee Browning T, RPH       docusate (COLACE) 50 MG/5ML liquid 100 mg  100 mg Per Tube BID Claiborne, Claudia, MD   100 mg at 05/31/24 9092   enoxaparin  (LOVENOX ) injection 40 mg  40 mg Subcutaneous Q24H Odell Celinda Balo, MD   40 mg at 05/30/24 1253   famotidine  (PEPCID ) 40 MG/5ML suspension 40 mg  40 mg Per Tube Daily Claiborne, Claudia, MD   40 mg at 05/31/24 0906   feeding supplement (OSMOLITE 1.2 CAL) liquid 1,000 mL  1,000 mL Per Tube Continuous Odell Celinda Balo, MD 60 mL/hr at 05/30/24 1733 1,000 mL at 05/30/24 1733   feeding supplement (PROSource TF20) liquid 60 mL  60 mL Per Tube Daily Odell Celinda Balo, MD   60 mL at 05/31/24 0908   folic acid  (FOLVITE ) tablet 1 mg  1 mg Per Tube Daily Arthea Child,  MD   1 mg at 05/31/24 0910   free water  200 mL  200 mL Per Tube Q4H Odell Celinda Balo, MD   200 mL at 05/31/24 0859   insulin  aspart (novoLOG ) injection 0-20 Units  0-20 Units Subcutaneous Q4H Cheryle Page, MD   4 Units at 05/31/24 0902   insulin  aspart (novoLOG ) injection 5 Units  5 Units Subcutaneous Q4H Cheryle Page, MD   5 Units at 05/31/24 0903   levETIRAcetam  (KEPPRA ) 100 MG/ML solution 1,000 mg  1,000 mg Per Tube BID Claiborne, Claudia, MD   1,000 mg at 05/31/24 9092   lip balm (CARMEX) ointment 1 Application  1 Application Topical PRN Cheryle Page, MD   1 Application at 05/29/24 1218   liver oil-zinc  oxide (DESITIN) 40 % ointment   Topical BID Odell Celinda Balo, MD   Given at 05/31/24 0910   metoprolol  tartrate (LOPRESSOR ) tablet 25 mg  25 mg Per Tube BID Claiborne, Claudia, MD   25 mg at 05/31/24 0910   multivitamin with minerals tablet 1 tablet  1 tablet Per Tube Daily Claiborne,  Claudia, MD   1 tablet at 05/31/24 0910   ondansetron  (ZOFRAN ) tablet 4 mg  4 mg Oral Q6H PRN Arthea Child, MD       Or   ondansetron  (ZOFRAN ) injection 4 mg  4 mg Intravenous Q6H PRN Arthea Child, MD       simethicone  (MYLICON) 40 MG/0.6ML suspension 80 mg  80 mg Per Tube TID Cheryle Page, MD   80 mg at 05/31/24 0906   simvastatin  (ZOCOR ) tablet 10 mg  10 mg Per Tube QHS Claiborne, Claudia, MD   10 mg at 05/30/24 2153   sodium chloride  flush (NS) 0.9 % injection 3 mL  3 mL Intravenous Q12H Arthea Child, MD   3 mL at 05/30/24 2227   valproic  acid (DEPAKENE ) 250 MG/5ML solution 500 mg  500 mg Per Tube TID with meals Arthea Child, MD   500 mg at 05/31/24 9093   And   valproic  acid (DEPAKENE ) 250 MG/5ML solution 750 mg  750 mg Per Tube QHS Claiborne, Claudia, MD   750 mg at 05/30/24 2153     Discharge Medications: Please see discharge summary for a list of discharge medications.  Relevant Imaging Results:  Relevant Lab Results:   Additional Information SSN: [id number]  Alfonse JONELLE Rex, RN

## 2024-05-31 NOTE — Care Management Important Message (Signed)
 Important Message  Patient Details IM Letter given. Name: Moon Budde MRN: [id number] Date of Birth: [birth year]   Important Message Given:  Yes - Medicare IM     Melba Ates 05/31/2024, 10:53 AM

## 2024-05-31 NOTE — Discharge Summary (Signed)
 Physician Discharge Summary  Charde Macfarlane FMW:[id number] DOB: 09/24/1950 DOA: 05/19/2024  PCP: System, Provider Not In  Admit date: 05/19/2024 Discharge date: 05/31/2024  Admitted From: LTC Disposition: LTC  Recommendations for Outpatient Follow-up:  Follow up with long-term care facility provider at earliest convenience Outpatient follow-up with palliative care  follow up in ED if symptoms worsen or new appear   Home Health: No Equipment/Devices: None  Discharge Condition: Poor CODE STATUS: Full Diet recommendation: Tube feeding as per dietitian recommendations  Brief/Interim Summary: 73 y.o. 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 She was sent from nursing home due to feeling tube leakage. CT of the abdomen and pelvis showed jejunostomy tube in place without any obvious complication, adynamic ileus with initial lactic acid of 2.8, hemoglobin of 6.3, repeated hemoglobin was 12 with no leukocytosis. She was admitted for sepsis secondary to cellulitis along with UTI. She was initially started on IV antibiotics. Urine cultures grew Pseudomonas: Antibiotics switched to ciprofloxacin and patient has completed course. Palliative care consulted for goals of care discussion. Patient remains full code. Overall prognosis is poor.  Patient currently remains hemodynamically stable.  She will be discharged back to long-term care facility once bed is available.  Discharge Diagnoses:   Sepsis: Present on admission; resolved Pseudomonas UTI: Present on admission; possibly associated with chronic  indwelling Foley catheter Jejunostomy tube site cellulitis -Imaging unremarkable. Interventional radiology was consulted they recommended to apply Desitin to skin surface, tape to tube to prevent slippage.  Okay to use feeding tube.  - Completed 7-day course of ciprofloxacin - She will need to continue on suppressive antibiotic therapy for endocarditis  - No temperature spikes over the last 72 hours    acute kidney injury - Resolved.  Outpatient follow-up   Hypernatremia - Improved.  Continue free water  per tube   Hyponatremia - Mild.  Monitor intermittently as an outpatient   Acute metabolic acidosis - Improved   Hypophosphatemia - No labs today.   Leukocytosis - Resolved   History of endocarditis - In the setting of AICD; currently on cephalexin.  Resume suppressive cefadroxil on discharge   Unspecified CVA Hyperlipidemia - Continue aspirin  and statin   History of seizure disorder -Continue Keppra  and valproic  acid   Paroxysmal A-fib - Currently rate controlled.  Continue amiodarone  and metoprolol    Essential hypertension--continue metoprolol    Diabetes mellitus type 2 with hyperglycemia - Outpatient follow-up  GERD -Continue famotidine    Hypokalemia - Resolved   Thrombocytopenia - Mild.  Monitor intermittently as an outpatient.   Nutrition - Continue jejunostomy tube feeding as per dietitian recommendations.  Continue free water  per tube   Stage IV sacral decubitus ulcer: Present on admission - Continue wound care as per wound care consult recommendations   Goals of care - Overall prognosis is poor.  Patient remains full code.  Palliative care following and recommending outpatient follow-up with palliative care.   Obesity class I - Outpatient follow-up   Discharge Instructions  Discharge Instructions     Increase activity slowly   Complete by: As directed    No wound care   Complete by: As directed  Allergies as of 05/31/2024        Reactions   Tape Rash   Demerol [meperidine Hcl] Nausea And Vomiting   Sulfonamide Derivatives Itching   Bacitracin-polymyxin B Dermatitis   Not listed on the [facility]   Oxycontin  [oxycodone ] Itching        Medication List     STOP taking these medications    HYDROcodone -acetaminophen  5-325 MG tablet Commonly known as: NORCO/VICODIN   lactulose  10 GM/15ML solution Commonly known as: CHRONULAC    losartan  25 MG tablet Commonly known as: COZAAR        TAKE these medications    acetaminophen  160 MG/5ML solution Commonly known as: TYLENOL  Place 20 mLs (640 mg total) into feeding tube every 8 (eight) hours.   acetic acid  0.25 % irrigation 20cc irrigation twice a day, flush foley to prevent clogging   amiodarone  200 MG tablet Commonly known as: PACERONE  Place 1 tablet (200 mg total) into feeding tube daily.   ascorbic acid  500 MG tablet Commonly known as: VITAMIN C  Place 0.5 tablets (250 mg total) into feeding tube 2 (two) times daily.   aspirin  81 MG chewable tablet Place 81 mg into feeding tube at bedtime.   cefadroxil 500 MG/5ML suspension Commonly known as: DURICEF Take 10 mLs (1,000 mg total) by mouth every 12 (twelve) hours.   chlorhexidine  0.12 % solution Commonly known as: PERIDEX  Dab a toothbrush into 15 mLs and brush teeth 2 times a day   Cranberry 425 MG Caps Take 425 mg by mouth in the morning and at bedtime.   cyclobenzaprine  5 MG tablet Commonly known as: FLEXERIL  Place 2.5 mg into feeding tube in the morning and at bedtime. Hold If Sedated   docusate sodium  100 MG capsule Commonly known as: COLACE Give 1 capsule per tube twice a day   estradiol 0.1 MG/GM vaginal cream Commonly known as: ESTRACE Place vaginally See admin instructions. Apply a pea-sized amount to the urethra/vaginal wall on Tuesdays and Fridays   famotidine  40 MG/5ML suspension Commonly known as: PEPCID  Place 40 mg into feeding tube daily.   feeding supplement (OSMOLITE 1.2  CAL) Liqd Place 1,000 mLs into feeding tube continuous.   feeding supplement (PROSource TF20) liquid Place 60 mLs into feeding tube daily. Start taking on: June 01, 2024   folic acid  800 MCG tablet Commonly known as: FOLVITE  Place 800 mcg into feeding tube daily.   free water  Soln Place 200 mLs into feeding tube every 4 (four) hours.   levETIRAcetam  100 MG/ML solution Commonly known as: KEPPRA  Place 1,000 mg into feeding tube 2 (two) times daily.   liver oil-zinc  oxide 40 % ointment Commonly known as: DESITIN Apply topically 2 (two) times daily. Apply Desitin around G tube site BID and change split thickness gauze each time   metoprolol  tartrate 25 MG tablet Commonly known as: LOPRESSOR  Place 1 tablet (25 mg total) into feeding tube 2 (two) times daily.   multivitamin with minerals Tabs tablet Place 1 tablet into feeding tube daily.   nystatin  ointment Commonly known as: MYCOSTATIN  Apply 1 Application topically 3 (three) times daily.   polyethylene glycol 17 g packet Commonly known as: MIRALAX  / GLYCOLAX  Place 34 g into feeding tube daily.   simethicone  80 MG chewable tablet Commonly known as: MYLICON Place 80 mg into feeding tube 3 (three) times daily.   simvastatin  10 MG tablet Commonly known as: ZOCOR  Place 1 tablet (10 mg total) into feeding tube at bedtime.   torsemide  10 MG tablet Commonly known  as: DEMADEX  Place 10 mg into feeding tube every 8 (eight) hours. Give 10 mg per tube every 12 hours for edema, hold for a systolic reading less than 110   valproic  acid 250 MG/5ML solution Commonly known as: DEPAKENE  Place 10-15 mLs into feeding tube See admin instructions. 10 ml via tube three times a day and 15 ml at bedtime   Valtoco  20 MG Dose 10 MG/0.1ML Lqpk Generic drug: diazePAM  (20 MG Dose) Place 20 mg into the nose daily as needed (seizures).        Allergies  Allergen Reactions   Tape Rash   Demerol [Meperidine Hcl] Nausea And Vomiting    Sulfonamide Derivatives Itching   Bacitracin-Polymyxin B Dermatitis    Not listed on the [facility]   Oxycontin  [Oxycodone ] Itching    Consultations: Palliative care   Procedures/Studies: DG Abd 1 View Result Date: 05/27/2024 EXAM: 1 VIEW XRAY OF THE ABDOMEN 05/27/2024 10:09:00 AM COMPARISON: CT 10 / 22 / 25 CLINICAL HISTORY: Abdominal pain FINDINGS: LINES, TUBES AND DEVICES: Catheter tubing projecting over left abdomen. BOWEL: Diffusely gas-filled, dilated loops of bowel throughout central abdomen, mainly reflecting large bowel, consistent with ileus. SOFT TISSUES: Fibroid calcifications in pelvis. No opaque urinary calculi. BONES: Lumbosacral fusion hardware present. IMPRESSION: 1. Dilated loops of large bowel throughout the central abdomen similar to prior CT and suggestive of chronic adynamic ileus. Electronically signed by: Norman Gatlin MD 05/27/2024 01:56 PM EDT RP Workstation: [id number]   CT ABDOMEN PELVIS W CONTRAST Result Date: 05/19/2024 CLINICAL DATA:  Leaking gastrostomy tube, infection around G-tube site, abdominal pain EXAM: CT ABDOMEN AND PELVIS WITH CONTRAST TECHNIQUE: Multidetector CT imaging of the abdomen and pelvis was performed using the standard protocol following bolus administration of intravenous contrast. RADIATION DOSE REDUCTION: This exam was performed according to the departmental dose-optimization program which includes automated exposure control, adjustment of the mA and/or kV according to patient size and/or use of iterative reconstruction technique. CONTRAST:  OMNIPAQUE  IOHEXOL  300 MG/ML  SOLN COMPARISON:  03/24/2024 FINDINGS: Lower chest: No acute pleural or parenchymal lung disease. Dual lead cardiac pacer. Hepatobiliary: No focal liver abnormality is seen. Status post cholecystectomy. No biliary dilatation. Pancreas: Unremarkable. No pancreatic ductal dilatation or surrounding inflammatory changes. Spleen: Normal in size without focal abnormality.  Adrenals/Urinary Tract: There is a 4.3 x 4.0 x 4.8 cm complex cyst upper pole left kidney, with peripheral 1.8 x 1.2 x 1.3 cm nodularity. The peripheral nodularity is increased in prominence since prior exams, and further evaluation with nonemergent outpatient dedicated renal CT is recommended if the patient would be a therapy candidate should neoplasm be detected. Otherwise the kidneys are unremarkable. No urinary tract calculi or obstructive uropathy. The adrenals appear normal. The bladder is decompressed with a Foley catheter in place. Stomach/Bowel: No bowel obstruction or ileus. There is chronic gaseous distension of the colon likely reflecting chronic adynamic colonic ileus. Multiple clips within the cecum. Normal appendix. Postsurgical changes from bariatric surgery. Stable position of the percutaneous change in ostomy tube, tip within the mid jejunum. No acute findings within the abdominal wall surrounding the jejunostomy catheter site. Vascular/Lymphatic: Aortic atherosclerosis. No enlarged abdominal or pelvic lymph nodes. Reproductive: Stable fibroid uterus.  No adnexal masses. Other: No free fluid or free intraperitoneal gas. No abdominal wall hernia. Musculoskeletal: No acute or destructive bony abnormalities. Stable postsurgical changes within the lower lumbar spine. Reconstructed images demonstrate no additional findings. IMPRESSION: 1. Stable percutaneous jejunostomy tube, without evidence of complication. 2. Chronic gaseous distention of the  colon, likely representing chronic adynamic colonic ileus. 3. Complex upper pole left renal cyst, with peripheral nodularity. Further evaluation with nonemergent outpatient dedicated renal CT is recommended if the patient would be a therapy candidate should neoplasm be detected. 4.  Aortic Atherosclerosis (ICD10-I70.0). Electronically Signed   By: Ozell Daring M.D.   On: 05/19/2024 16:59   CT Head Wo Contrast Result Date: 05/19/2024 EXAM: CT HEAD WITHOUT  CONTRAST 05/19/2024 04:46:55 PM TECHNIQUE: CT of the head was performed without the administration of intravenous contrast. Automated exposure control, iterative reconstruction, and/or weight based adjustment of the mA/kV was utilized to reduce the radiation dose to as low as reasonably achievable. COMPARISON: Comparison with 07/14/2023. CLINICAL HISTORY: Mental status change, unknown cause. Leaking G-tube, infected around site (staff noticed today). CBG 162. Blood pressure 120/80-90. Oxygen saturation 88% on room air, 98% on 6 liters. Answers questions by nodding head. Decline in mental status. FINDINGS: BRAIN AND VENTRICLES: No acute hemorrhage. No evidence of acute infarct. Chronic microvascular ischemia and generalized atrophy. Chronic left MCA territory infarct. Chronic right thalamic infarct. Chronic left cerebellar infarcts. No hydrocephalus. No extra-axial collection. No mass effect or midline shift. ORBITS: No acute abnormality. SINUSES: No acute abnormality. SOFT TISSUES AND SKULL: No acute soft tissue abnormality. No skull fracture. IMPRESSION: 1. No acute intracranial abnormality. Electronically signed by: Norman Gatlin MD 05/19/2024 04:54 PM EDT RP Workstation: [id number]   DG Chest Port 1 View Result Date: 05/19/2024 EXAM: 1 VIEW(S) XRAY OF THE CHEST 05/19/2024 01:56:39 PM COMPARISON: 06/29/2023 CLINICAL HISTORY: Questionable sepsis - evaluate for abnormality. FINDINGS: LUNGS AND PLEURA: Low lung volumes. Elevated right hemidiaphragm, unchanged. No focal pulmonary opacity. No pulmonary edema. No pleural effusion. No pneumothorax. HEART AND MEDIASTINUM: Aortic atherosclerosis. Left pacemaker in place. No acute abnormality of the cardiac and mediastinal silhouettes. BONES AND SOFT TISSUES: No acute osseous abnormality. IMPRESSION: 1. No acute cardiopulmonary process identified. Electronically signed by: Waddell Calk MD 05/19/2024 02:25 PM EDT RP Workstation: [id number]       Subjective: Patient seen and examined at bedside.  No seizures, vomiting or agitation reported.  Discharge Exam: Vitals:   05/30/24 2141 05/31/24 0508  BP: 93/60 (!) 97/57  Pulse: 67 65  Resp: 17 18  Temp: 97.7 F (36.5 C) (!) 97.5 F (36.4 C)  SpO2: 98% 96%    General: Chronically ill and deconditioned looking elderly female lying in bed.  Wakes up slightly, hardly follows any commands.   Cardiovascular: rate controlled, S1/S2 + Respiratory: bilateral decreased breath sounds at bases with scattered crackles Abdominal: Soft, obese, NT, ND, bowel sounds +, feeding tube present Extremities: Trace lower extremity edema; no cyanosis    The results of significant diagnostics from this hospitalization (including imaging, microbiology, ancillary and laboratory) are listed below for reference.     Microbiology: No results found for this or any previous visit (from the past 240 hours).   Labs: BNP (last 3 results) No results for input(s): BNP in the last 8760 hours. Basic Metabolic Panel: Recent Labs  Lab 05/25/24 0337 05/26/24 0351 05/27/24 0401 05/28/24 0342 05/29/24 0335  NA 143  --  135 131* 131*  K 4.5  --  3.8 4.3 4.2  CL 115*  --  108 104 104  CO2 21*  --  20* 19* 23  GLUCOSE 106*  --  178* 356* 335*  BUN 13  --  22 25* 22  CREATININE 0.72  --  0.97 0.76 0.59  CALCIUM  7.9*  --  7.5* 7.1* 6.8*  MG 2.6* 2.4 2.7* 3.1* 3.2*  PHOS 2.2* 2.0* 1.7* 1.3* 2.1*   Liver Function Tests: Recent Labs  Lab 05/28/24 0342  AST 45*  ALT 12  ALKPHOS 205*  BILITOT 0.6  PROT 7.7  ALBUMIN  1.7*   No results for input(s): LIPASE, AMYLASE in the last 168 hours. No results for input(s): AMMONIA in the last 168 hours. CBC: Recent Labs  Lab 05/27/24 0401 05/28/24 0342 05/29/24 0335  WBC 14.0* 9.4 8.1  NEUTROABS 8.8* 7.2 4.5  HGB 9.9* 9.4* 9.6*  HCT 32.0* 31.3* 32.1*  MCV 113.1* 110.2* 110.7*  PLT 165 118* 104*   Cardiac Enzymes: No results for  input(s): CKTOTAL, CKMB, CKMBINDEX, TROPONINI in the last 168 hours. BNP: Invalid input(s): POCBNP CBG: Recent Labs  Lab 05/30/24 1602 05/30/24 2017 05/30/24 2348 05/31/24 0401 05/31/24 0749  GLUCAP 148* 148* 150* 153* 162*   D-Dimer No results for input(s): DDIMER in the last 72 hours. Hgb A1c Recent Labs    05/29/24 0335  HGBA1C 5.7*   Lipid Profile No results for input(s): CHOL, HDL, LDLCALC, TRIG, CHOLHDL, LDLDIRECT in the last 72 hours. Thyroid  function studies No results for input(s): TSH, T4TOTAL, T3FREE, THYROIDAB in the last 72 hours.  Invalid input(s): FREET3 Anemia work up No results for input(s): VITAMINB12, FOLATE, FERRITIN, TIBC, IRON, RETICCTPCT in the last 72 hours. Urinalysis    Component Value Date/Time   COLORURINE YELLOW 05/19/2024 1422   APPEARANCEUR HAZY (A) 05/19/2024 1422   LABSPEC 1.014 05/19/2024 1422   PHURINE 6.0 05/19/2024 1422   GLUCOSEU NEGATIVE 05/19/2024 1422   HGBUR MODERATE (A) 05/19/2024 1422   BILIRUBINUR NEGATIVE 05/19/2024 1422   BILIRUBINUR 1+ 11/19/2016 1307   KETONESUR NEGATIVE 05/19/2024 1422   PROTEINUR 30 (A) 05/19/2024 1422   UROBILINOGEN 1.0 11/19/2016 1307   NITRITE NEGATIVE 05/19/2024 1422   LEUKOCYTESUR LARGE (A) 05/19/2024 1422   Sepsis Labs Recent Labs  Lab 05/27/24 0401 05/28/24 0342 05/29/24 0335  WBC 14.0* 9.4 8.1   Microbiology No results found for this or any previous visit (from the past 240 hours).   Time coordinating discharge: 35 minutes  SIGNED:   Sophie Mao, MD  [facility] 05/31/2024, 9:41 AM

## 2024-05-31 NOTE — TOC Progression Note (Signed)
 Transition of Care [facility]) - Progression Note    Patient Details  Name: Kathleen Jensen MRN: [id number] Date of Birth: [birth year]  Transition of [facility]) CM/SW Contact  Alfonse JONELLE Rex, RN Phone Number: 05/31/2024, 12:40 PM  Clinical Narrative:   DC to [facility].  Patient's legal guardian , [name] Search, [phone], notified, vm left with NCM name and phone number. PTAR for transport.  No further TOC needs identified at this time.         Expected Discharge Plan: Long Term Nursing Home (LTC at [facility]) Barriers to Discharge: Barriers Resolved               Expected Discharge Plan and Services       Living arrangements for the past 2 months: Skilled Nursing Facility [facility]) Expected Discharge Date: 05/31/24                                     Social Drivers of Health (SDOH) Interventions SDOH Screenings   Food Insecurity: Patient Unable To Answer (05/20/2024)  Housing: Patient Unable To Answer (05/20/2024)  Transportation Needs: Patient Unable To Answer (03/25/2024)  Utilities: Patient Unable To Answer (03/25/2024)  Depression (PHQ2-9): Low Risk  (05/22/2019)  Social Connections: Unknown (03/25/2024)  Tobacco Use: Medium Risk (05/19/2024)    Readmission Risk Interventions    05/31/2024   12:39 PM 05/21/2024    1:03 PM 02/18/2022    2:23 PM  Readmission Risk Prevention Plan  Transportation Screening Complete Complete Complete  PCP or Specialist Appt within 5-7 Days   Complete  Home Care Screening   Complete  Medication Review (RN CM)   Complete  Medication Review (RN Care Manager) Complete Complete   PCP or Specialist appointment within 3-5 days of discharge Complete Complete   HRI or Home Care Consult Complete Complete   SW Recovery Care/Counseling Consult Complete Complete   Palliative Care Screening Complete Complete   Skilled Nursing Facility Not Applicable Not Applicable

## 2024-05-31 NOTE — Plan of Care (Signed)
  Problem: Clinical Measurements: Goal: Ability to maintain clinical measurements within normal limits will improve Outcome: Progressing Goal: Will remain free from infection Outcome: Progressing Goal: Diagnostic test results will improve Outcome: Progressing Goal: Respiratory complications will improve Outcome: Progressing Goal: Cardiovascular complication will be avoided Outcome: Progressing   Problem: Activity: Goal: Risk for activity intolerance will decrease Outcome: Progressing   Problem: Nutrition: Goal: Adequate nutrition will be maintained Outcome: Progressing   Problem: Coping: Goal: Level of anxiety will decrease Outcome: Progressing   Problem: Elimination: Goal: Will not experience complications related to bowel motility Outcome: Progressing Goal: Will not experience complications related to urinary retention Outcome: Progressing   Problem: Pain Managment: Goal: General experience of comfort will improve and/or be controlled Outcome: Progressing   Problem: Safety: Goal: Ability to remain free from injury will improve Outcome: Progressing   Problem: Skin Integrity: Goal: Risk for impaired skin integrity will decrease Outcome: Progressing   Problem: Education: Goal: Ability to describe self-care measures that may prevent or decrease complications (Diabetes Survival Skills Education) will improve Outcome: Progressing   Problem: Coping: Goal: Ability to adjust to condition or change in health will improve Outcome: Progressing   Problem: Fluid Volume: Goal: Ability to maintain a balanced intake and output will improve Outcome: Progressing   Problem: Health Behavior/Discharge Planning: Goal: Ability to identify and utilize available resources and services will improve Outcome: Progressing Goal: Ability to manage health-related needs will improve Outcome: Progressing   Problem: Metabolic: Goal: Ability to maintain appropriate glucose levels will  improve Outcome: Progressing   Problem: Nutritional: Goal: Maintenance of adequate nutrition will improve Outcome: Progressing Goal: Progress toward achieving an optimal weight will improve Outcome: Progressing   Problem: Skin Integrity: Goal: Risk for impaired skin integrity will decrease Outcome: Progressing   Problem: Tissue Perfusion: Goal: Adequacy of tissue perfusion will improve Outcome: Progressing

## 2024-06-04 ENCOUNTER — Inpatient Hospital Stay (HOSPITAL_COMMUNITY)
Admission: EM | Admit: 2024-06-04 | Discharge: 2024-06-15 | DRG: 871 | Disposition: A | Discharge status: Skilled Nursing Facility | Source: Skilled Nursing Facility | Attending: Internal Medicine | Admitting: Internal Medicine

## 2024-06-04 ENCOUNTER — Emergency Department (HOSPITAL_COMMUNITY)

## 2024-06-04 ENCOUNTER — Other Ambulatory Visit: Payer: Self-pay

## 2024-06-04 DIAGNOSIS — Z91048 Other nonmedicinal substance allergy status: Secondary | ICD-10-CM

## 2024-06-04 DIAGNOSIS — R5381 Other malaise: Secondary | ICD-10-CM | POA: Diagnosis present

## 2024-06-04 DIAGNOSIS — Z6833 Body mass index (BMI) 33.0-33.9, adult: Secondary | ICD-10-CM

## 2024-06-04 DIAGNOSIS — Z885 Allergy status to narcotic agent status: Secondary | ICD-10-CM

## 2024-06-04 DIAGNOSIS — G4733 Obstructive sleep apnea (adult) (pediatric): Secondary | ICD-10-CM | POA: Diagnosis present

## 2024-06-04 DIAGNOSIS — E872 Acidosis, unspecified: Secondary | ICD-10-CM | POA: Diagnosis present

## 2024-06-04 DIAGNOSIS — Z515 Encounter for palliative care: Secondary | ICD-10-CM

## 2024-06-04 DIAGNOSIS — R410 Disorientation, unspecified: Principal | ICD-10-CM

## 2024-06-04 DIAGNOSIS — E876 Hypokalemia: Secondary | ICD-10-CM | POA: Diagnosis present

## 2024-06-04 DIAGNOSIS — Z823 Family history of stroke: Secondary | ICD-10-CM

## 2024-06-04 DIAGNOSIS — A4152 Sepsis due to Pseudomonas: Secondary | ICD-10-CM | POA: Diagnosis not present

## 2024-06-04 DIAGNOSIS — Z8679 Personal history of other diseases of the circulatory system: Secondary | ICD-10-CM

## 2024-06-04 DIAGNOSIS — I69320 Aphasia following cerebral infarction: Secondary | ICD-10-CM

## 2024-06-04 DIAGNOSIS — Z9884 Bariatric surgery status: Secondary | ICD-10-CM

## 2024-06-04 DIAGNOSIS — R569 Unspecified convulsions: Secondary | ICD-10-CM | POA: Diagnosis not present

## 2024-06-04 DIAGNOSIS — K219 Gastro-esophageal reflux disease without esophagitis: Secondary | ICD-10-CM | POA: Diagnosis present

## 2024-06-04 DIAGNOSIS — I5032 Chronic diastolic (congestive) heart failure: Secondary | ICD-10-CM | POA: Diagnosis present

## 2024-06-04 DIAGNOSIS — Z7401 Bed confinement status: Secondary | ICD-10-CM

## 2024-06-04 DIAGNOSIS — A419 Sepsis, unspecified organism: Secondary | ICD-10-CM | POA: Diagnosis present

## 2024-06-04 DIAGNOSIS — Z79899 Other long term (current) drug therapy: Secondary | ICD-10-CM

## 2024-06-04 DIAGNOSIS — R4701 Aphasia: Secondary | ICD-10-CM | POA: Diagnosis present

## 2024-06-04 DIAGNOSIS — Z7982 Long term (current) use of aspirin: Secondary | ICD-10-CM

## 2024-06-04 DIAGNOSIS — K9412 Enterostomy infection: Secondary | ICD-10-CM | POA: Diagnosis present

## 2024-06-04 DIAGNOSIS — Z87891 Personal history of nicotine dependence: Secondary | ICD-10-CM

## 2024-06-04 DIAGNOSIS — L03319 Cellulitis of trunk, unspecified: Secondary | ICD-10-CM | POA: Diagnosis present

## 2024-06-04 DIAGNOSIS — Z1623 Resistance to quinolones and fluoroquinolones: Secondary | ICD-10-CM | POA: Diagnosis present

## 2024-06-04 DIAGNOSIS — F0390 Unspecified dementia without behavioral disturbance: Secondary | ICD-10-CM | POA: Diagnosis present

## 2024-06-04 DIAGNOSIS — D696 Thrombocytopenia, unspecified: Secondary | ICD-10-CM | POA: Diagnosis present

## 2024-06-04 DIAGNOSIS — L89150 Pressure ulcer of sacral region, unstageable: Secondary | ICD-10-CM | POA: Diagnosis present

## 2024-06-04 DIAGNOSIS — I480 Paroxysmal atrial fibrillation: Secondary | ICD-10-CM | POA: Diagnosis present

## 2024-06-04 DIAGNOSIS — I130 Hypertensive heart and chronic kidney disease with heart failure and stage 1 through stage 4 chronic kidney disease, or unspecified chronic kidney disease: Secondary | ICD-10-CM | POA: Diagnosis present

## 2024-06-04 DIAGNOSIS — I2699 Other pulmonary embolism without acute cor pulmonale: Secondary | ICD-10-CM | POA: Diagnosis present

## 2024-06-04 DIAGNOSIS — Z66 Do not resuscitate: Secondary | ICD-10-CM | POA: Diagnosis not present

## 2024-06-04 DIAGNOSIS — G9341 Metabolic encephalopathy: Secondary | ICD-10-CM | POA: Diagnosis present

## 2024-06-04 DIAGNOSIS — E870 Hyperosmolality and hypernatremia: Secondary | ICD-10-CM | POA: Diagnosis present

## 2024-06-04 DIAGNOSIS — I495 Sick sinus syndrome: Secondary | ICD-10-CM | POA: Diagnosis present

## 2024-06-04 DIAGNOSIS — N189 Chronic kidney disease, unspecified: Secondary | ICD-10-CM | POA: Diagnosis present

## 2024-06-04 DIAGNOSIS — Z800 Family history of malignant neoplasm of digestive organs: Secondary | ICD-10-CM

## 2024-06-04 DIAGNOSIS — H269 Unspecified cataract: Secondary | ICD-10-CM | POA: Diagnosis present

## 2024-06-04 DIAGNOSIS — Z1152 Encounter for screening for COVID-19: Secondary | ICD-10-CM

## 2024-06-04 DIAGNOSIS — Z833 Family history of diabetes mellitus: Secondary | ICD-10-CM

## 2024-06-04 DIAGNOSIS — Z95810 Presence of automatic (implantable) cardiac defibrillator: Secondary | ICD-10-CM

## 2024-06-04 DIAGNOSIS — G40909 Epilepsy, unspecified, not intractable, without status epilepticus: Secondary | ICD-10-CM | POA: Diagnosis present

## 2024-06-04 DIAGNOSIS — E871 Hypo-osmolality and hyponatremia: Secondary | ICD-10-CM | POA: Diagnosis present

## 2024-06-04 DIAGNOSIS — E66811 Obesity, class 1: Secondary | ICD-10-CM | POA: Diagnosis present

## 2024-06-04 DIAGNOSIS — E785 Hyperlipidemia, unspecified: Secondary | ICD-10-CM | POA: Diagnosis present

## 2024-06-04 DIAGNOSIS — R6520 Severe sepsis without septic shock: Secondary | ICD-10-CM | POA: Diagnosis present

## 2024-06-04 DIAGNOSIS — I69351 Hemiplegia and hemiparesis following cerebral infarction affecting right dominant side: Secondary | ICD-10-CM

## 2024-06-04 DIAGNOSIS — Z8614 Personal history of Methicillin resistant Staphylococcus aureus infection: Secondary | ICD-10-CM

## 2024-06-04 DIAGNOSIS — R1310 Dysphagia, unspecified: Secondary | ICD-10-CM | POA: Diagnosis present

## 2024-06-04 DIAGNOSIS — E1122 Type 2 diabetes mellitus with diabetic chronic kidney disease: Secondary | ICD-10-CM | POA: Diagnosis present

## 2024-06-04 DIAGNOSIS — K9423 Gastrostomy malfunction: Secondary | ICD-10-CM | POA: Diagnosis present

## 2024-06-04 DIAGNOSIS — I10 Essential (primary) hypertension: Secondary | ICD-10-CM | POA: Diagnosis present

## 2024-06-04 DIAGNOSIS — Z931 Gastrostomy status: Secondary | ICD-10-CM

## 2024-06-04 DIAGNOSIS — I959 Hypotension, unspecified: Secondary | ICD-10-CM | POA: Diagnosis present

## 2024-06-04 LAB — URINALYSIS, W/ REFLEX TO CULTURE (INFECTION SUSPECTED)
Bilirubin Urine: NEGATIVE
Glucose, UA: NEGATIVE mg/dL
Hgb urine dipstick: NEGATIVE
Ketones, ur: NEGATIVE mg/dL
Nitrite: NEGATIVE
Protein, ur: NEGATIVE mg/dL
Specific Gravity, Urine: 1.015 (ref 1.005–1.030)
pH: 6 (ref 5.0–8.0)

## 2024-06-04 LAB — RESP PANEL BY RT-PCR (RSV, FLU A&B, COVID)  RVPGX2
Influenza A by PCR: NEGATIVE
Influenza B by PCR: NEGATIVE
Resp Syncytial Virus by PCR: NEGATIVE
SARS Coronavirus 2 by RT PCR: NEGATIVE

## 2024-06-04 LAB — CBC WITH DIFFERENTIAL/PLATELET
Abs Immature Granulocytes: 0.06 K/uL (ref 0.00–0.07)
Basophils Absolute: 0 K/uL (ref 0.0–0.1)
Basophils Relative: 0 %
Eosinophils Absolute: 0.1 K/uL (ref 0.0–0.5)
Eosinophils Relative: 1 %
HCT: 32.5 % — ABNORMAL LOW (ref 36.0–46.0)
Hemoglobin: 10.5 g/dL — ABNORMAL LOW (ref 12.0–15.0)
Immature Granulocytes: 1 %
Lymphocytes Relative: 23 %
Lymphs Abs: 2.3 K/uL (ref 0.7–4.0)
MCH: 35.2 pg — ABNORMAL HIGH (ref 26.0–34.0)
MCHC: 32.3 g/dL (ref 30.0–36.0)
MCV: 109.1 fL — ABNORMAL HIGH (ref 80.0–100.0)
Monocytes Absolute: 1.6 K/uL — ABNORMAL HIGH (ref 0.1–1.0)
Monocytes Relative: 16 %
Neutro Abs: 6 K/uL (ref 1.7–7.7)
Neutrophils Relative %: 59 %
Platelets: 154 K/uL (ref 150–400)
RBC: 2.98 MIL/uL — ABNORMAL LOW (ref 3.87–5.11)
RDW: 16.6 % — ABNORMAL HIGH (ref 11.5–15.5)
WBC: 10 K/uL (ref 4.0–10.5)
nRBC: 0 % (ref 0.0–0.2)

## 2024-06-04 LAB — COMPREHENSIVE METABOLIC PANEL WITH GFR
ALT: 14 U/L (ref 0–44)
AST: 48 U/L — ABNORMAL HIGH (ref 15–41)
Albumin: <1.5 g/dL — ABNORMAL LOW (ref 3.5–5.0)
Alkaline Phosphatase: 183 U/L — ABNORMAL HIGH (ref 38–126)
Anion gap: 8 (ref 5–15)
BUN: 21 mg/dL (ref 8–23)
CO2: 27 mmol/L (ref 22–32)
Calcium: 7.7 mg/dL — ABNORMAL LOW (ref 8.9–10.3)
Chloride: 96 mmol/L — ABNORMAL LOW (ref 98–111)
Creatinine, Ser: 0.82 mg/dL (ref 0.44–1.00)
GFR, Estimated: >60 mL/min (ref >60)
Glucose, Bld: 154 mg/dL — ABNORMAL HIGH (ref 70–99)
Potassium: 4.7 mmol/L (ref 3.5–5.1)
Sodium: 131 mmol/L — ABNORMAL LOW (ref 135–145)
Total Bilirubin: 0.9 mg/dL (ref 0.0–1.2)
Total Protein: 8.8 g/dL — ABNORMAL HIGH (ref 6.5–8.1)

## 2024-06-04 LAB — I-STAT CG4 LACTIC ACID, ED
Lactic Acid, Venous: 3.2 mmol/L (ref 0.5–1.9)
Lactic Acid, Venous: 2.8 mmol/L (ref 0.5–1.9)

## 2024-06-04 LAB — PROTIME-INR
INR: 1.7 — ABNORMAL HIGH (ref 0.8–1.2)
Prothrombin Time: 20.9 s — ABNORMAL HIGH (ref 11.4–15.2)

## 2024-06-04 MED ORDER — SODIUM CHLORIDE 0.9 % IV BOLUS
500.0000 mL | Freq: Once | INTRAVENOUS | Status: AC
  Administered 2024-06-04: 500 mL via INTRAVENOUS

## 2024-06-04 MED ORDER — VANCOMYCIN HCL 1750 MG/350ML IV SOLN
1750.0000 mg | Freq: Once | INTRAVENOUS | Status: AC
  Administered 2024-06-04: 1750 mg via INTRAVENOUS
  Filled 2024-06-04: qty 350

## 2024-06-04 MED ORDER — SORBITOL 70 % SOLN
30.0000 mL | Freq: Every day | Status: DC | PRN
Start: 2024-06-04 — End: 2024-06-06

## 2024-06-04 MED ORDER — ACETAMINOPHEN 650 MG RE SUPP: 650.0000 mg | Freq: Four times a day (QID) | RECTAL | Status: DC | PRN

## 2024-06-04 MED ORDER — ACETAMINOPHEN 500 MG PO TABS
500.0000 mg | ORAL_TABLET | Freq: Four times a day (QID) | ORAL | Status: DC | PRN
  Administered 2024-06-06 – 2024-06-12 (×5): 500 mg
  Filled 2024-06-04 (×6): qty 1

## 2024-06-04 MED ORDER — ONDANSETRON HCL 4 MG/2ML IJ SOLN
4.0000 mg | Freq: Four times a day (QID) | INTRAMUSCULAR | Status: DC | PRN
Start: 2024-06-04 — End: 2024-06-15

## 2024-06-04 MED ORDER — ONDANSETRON HCL 4 MG PO TABS: 4.0000 mg | ORAL_TABLET | Freq: Four times a day (QID) | ORAL | Status: DC | PRN

## 2024-06-04 MED ORDER — PIPERACILLIN-TAZOBACTAM 3.375 G IVPB 30 MIN
3.3750 g | Freq: Once | INTRAVENOUS | Status: AC
  Administered 2024-06-04: 3.375 g via INTRAVENOUS
  Filled 2024-06-04: qty 50

## 2024-06-04 MED ORDER — SODIUM CHLORIDE 0.9 % IV BOLUS
1000.0000 mL | Freq: Once | INTRAVENOUS | Status: AC
  Administered 2024-06-04: 1000 mL via INTRAVENOUS

## 2024-06-04 NOTE — ED Notes (Signed)
 Phlebotomy to obtain second lactic

## 2024-06-04 NOTE — Progress Notes (Signed)
 ED Pharmacy Antibiotic Sign Off An antibiotic consult was received from an ED provider for vancomycin  per pharmacy dosing for sepsis. A chart review was completed to assess appropriateness.   The following one time order(s) were placed:  Vancomycin  1750 mg IV x 1  Further antibiotic and/or antibiotic pharmacy consults should be ordered by the admitting provider if indicated.   Thank you for allowing pharmacy to be a part of this patient's care.   Dorn Poot, [facility]  Clinical Pharmacist 06/04/24 4:54 PM

## 2024-06-04 NOTE — ED Provider Notes (Signed)
 [place] EMERGENCY DEPARTMENT AT [facility] Provider Note   CSN: [id number] Arrival date & time: 06/04/24  1636     Patient presents with: Altered Mental Status   Kathleen Jensen is a 73 y.o. female.   HPI     Presents with increasing altered mental status.  According to EMS, lives at facility.  According facility, patient had increasing altered mental status since Sunday.  Usually able to talk but since Sunday has been having increasing decline in mentation.  No falls.  No reports of fevers.   Previous medical history reviewed : Patient was discharged on May 31, 2024.  Sepsis.  Pseudomonas UTI.  Jejunostomy tube cellulitis.  History of endocarditis.  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   Prior to Admission medications   Medication Sig Start Date End Date Taking? Authorizing Provider  acetaminophen  (TYLENOL ) 160 MG/5ML solution Place 20 mLs (640 mg total) into feeding tube every 8 (eight) hours. 12/03/22   Elgergawy, Brayton RAMAN, MD  acetic acid  0.25 % irrigation 20cc irrigation twice a day, flush foley to prevent clogging 11/06/23   [provider]  amiodarone  (PACERONE ) 200 MG tablet Place 1 tablet (200 mg total) into feeding tube daily. 12/03/22   Elgergawy, Brayton RAMAN, MD  ascorbic acid  (VITAMIN C ) 500 MG tablet Place 0.5 tablets (250 mg total) into feeding tube 2 (two) times daily. 06/09/23   Rai, Nydia POUR, MD  aspirin  81 MG chewable tablet Place 81 mg into feeding tube at bedtime.    [provider]  cefadroxil (DURICEF) 500 MG/5ML suspension Take 10 mLs (1,000 mg total) by mouth every 12 (twelve) hours. 05/07/24 11/03/24  Vu, Constance DASEN, MD  chlorhexidine  (PERIDEX ) 0.12 % solution Dab a toothbrush into 15 mLs and brush teeth 2 times a day    [provider]  Cranberry 425 MG CAPS Take 425 mg by mouth in the morning and at bedtime.    [provider]   cyclobenzaprine  (FLEXERIL ) 5 MG tablet Place 2.5 mg into feeding tube in the morning and at bedtime. Hold If Sedated    [provider]  diazePAM , 20 MG Dose, (VALTOCO  20 MG DOSE) 2 x 10 MG/0.1ML LQPK Place 20 mg into the nose daily as needed (seizures).    [provider]  docusate sodium  (COLACE) 100 MG capsule Give 1 capsule per tube twice a day    [provider]  estradiol (ESTRACE) 0.1 MG/GM vaginal cream Place vaginally See admin instructions. Apply a pea-sized amount to the urethra/vaginal wall on Tuesdays and Fridays    [provider]  famotidine  (PEPCID ) 40 MG/5ML suspension Place 40 mg into feeding tube daily. 03/25/22   [provider]  folic acid  (FOLVITE ) 800 MCG tablet Place 800 mcg into feeding tube daily.    [provider]  levETIRAcetam  (KEPPRA ) 100 MG/ML solution Place 1,000 mg into feeding tube 2 (two) times daily.    [provider]  liver oil-zinc  oxide (DESITIN) 40 % ointment Apply topically 2 (two) times daily. Apply Desitin around G tube site BID and change split thickness gauze each time 05/31/24   Cheryle Page, MD  metoprolol  tartrate (LOPRESSOR ) 25 MG tablet Place 1 tablet (25 mg total) into feeding tube 2 (two) times daily. 12/03/22   Elgergawy, Brayton RAMAN, MD  Multiple Vitamin (MULTIVITAMIN WITH MINERALS) TABS tablet Place 1 tablet into feeding tube daily. 04/06/24   Tharon Lung, MD  Nutritional Supplements (FEEDING  SUPPLEMENT, OSMOLITE 1.2 CAL,) LIQD Place 1,000 mLs into feeding tube continuous. 05/31/24   Cheryle Page, MD  nystatin  ointment (MYCOSTATIN ) Apply 1 Application topically 3 (three) times daily. 04/26/24   [provider]  polyethylene glycol (MIRALAX  / GLYCOLAX ) 17 g packet Place 34 g into feeding tube daily. 06/09/23   Rai, Nydia POUR, MD  Protein (FEEDING SUPPLEMENT, PROSOURCE TF20,) liquid Place 60 mLs into feeding tube daily. 06/01/24   Cheryle Page, MD  simethicone  (MYLICON) 80 MG  chewable tablet Place 80 mg into feeding tube 3 (three) times daily.    [provider]  simvastatin  (ZOCOR ) 10 MG tablet Place 1 tablet (10 mg total) into feeding tube at bedtime. 02/22/22   Cheryle Page, MD  torsemide  (DEMADEX ) 10 MG tablet Place 10 mg into feeding tube every 8 (eight) hours. Give 10 mg per tube every 12 hours for edema, hold for a systolic reading less than 110    [provider]  valproic  acid (DEPAKENE ) 250 MG/5ML solution Place 10-15 mLs into feeding tube See admin instructions. 10 ml via tube three times a day and 15 ml at bedtime    [provider]  Water  For Irrigation, Sterile (FREE WATER ) SOLN Place 200 mLs into feeding tube every 4 (four) hours. 05/31/24   Cheryle Page, MD    Allergies: Tape, Demerol [meperidine hcl], Sulfonamide derivatives, Bacitracin-polymyxin b, and Oxycontin  [oxycodone ]    Review of Systems  Constitutional:  Negative for chills and fever.  HENT:  Negative for ear pain and sore throat.   Eyes:  Negative for pain and visual disturbance.  Respiratory:  Negative for cough and shortness of breath.   Cardiovascular:  Negative for chest pain and palpitations.  Gastrointestinal:  Negative for abdominal pain and vomiting.  Genitourinary:  Negative for dysuria and hematuria.  Musculoskeletal:  Negative for arthralgias and back pain.  Skin:  Negative for color change and rash.  Neurological:  Negative for seizures and syncope.  All other systems reviewed and are negative.   Updated Vital Signs BP 130/89   Pulse 84   Temp (!) 100.5 F (38.1 C) (Axillary)   Resp (!) 24   SpO2 100%   Physical Exam Vitals and nursing note reviewed.  Constitutional:      General: She is not in acute distress.    Appearance: She is well-developed.  HENT:     Head: Normocephalic and atraumatic.  Eyes:     Conjunctiva/sclera: Conjunctivae normal.  Cardiovascular:     Rate and Rhythm: Normal rate and regular rhythm.     Heart  sounds: No murmur heard. Pulmonary:     Effort: Pulmonary effort is normal. No respiratory distress.     Breath sounds: Normal breath sounds.  Abdominal:     Palpations: Abdomen is soft.     Tenderness: There is no abdominal tenderness.  Musculoskeletal:        General: No swelling.     Cervical back: Neck supple.  Skin:    General: Skin is warm and dry.     Capillary Refill: Capillary refill takes less than 2 seconds.  Neurological:     Mental Status: She is alert.     GCS: GCS eye subscore is 2. GCS verbal subscore is 3. GCS motor subscore is 5.  Psychiatric:        Mood and Affect: Mood normal.     (all labs ordered are listed, but only abnormal results are displayed) Labs Reviewed  COMPREHENSIVE METABOLIC PANEL WITH  GFR - Abnormal; Notable for the following components:      Result Value   Sodium 131 (*)    Chloride 96 (*)    Glucose, Bld 154 (*)    Calcium  7.7 (*)    Total Protein 8.8 (*)    Albumin  <1.5 (*)    AST 48 (*)    Alkaline Phosphatase 183 (*)    All other components within normal limits  CBC WITH DIFFERENTIAL/PLATELET - Abnormal; Notable for the following components:   RBC 2.98 (*)    Hemoglobin 10.5 (*)    HCT 32.5 (*)    MCV 109.1 (*)    MCH 35.2 (*)    RDW 16.6 (*)    Monocytes Absolute 1.6 (*)    All other components within normal limits  URINALYSIS, W/ REFLEX TO CULTURE (INFECTION SUSPECTED) - Abnormal; Notable for the following components:   Leukocytes,Ua TRACE (*)    Bacteria, UA FEW (*)    All other components within normal limits  PROTIME-INR - Abnormal; Notable for the following components:   Prothrombin Time 20.9 (*)    INR 1.7 (*)    All other components within normal limits  I-STAT CG4 LACTIC ACID, ED - Abnormal; Notable for the following components:   Lactic Acid, Venous 3.2 (*)    All other components within normal limits  I-STAT CG4 LACTIC ACID, ED - Abnormal; Notable for the following components:   Lactic Acid, Venous 2.8 (*)     All other components within normal limits  RESP PANEL BY RT-PCR (RSV, FLU A&B, COVID)  RVPGX2  CULTURE, BLOOD (ROUTINE X 2)  CULTURE, BLOOD (ROUTINE X 2)  URINE CULTURE  COMPREHENSIVE METABOLIC PANEL WITH GFR  CBC  PROTIME-INR    EKG: None  Radiology: CT CHEST ABDOMEN PELVIS WO CONTRAST Result Date: 06/04/2024 EXAM: CT CHEST, ABDOMEN AND PELVIS WITHOUT CONTRAST 06/04/2024 07:26:13 PM TECHNIQUE: CT of the chest, abdomen and pelvis was performed without the administration of intravenous contrast. Multiplanar reformatted images are provided for review. Automated exposure control, iterative reconstruction, and/or weight based adjustment of the mA/kV was utilized to reduce the radiation dose to as low as reasonably achievable. COMPARISON: CT abdomen and pelvis 05/19/2024. CLINICAL HISTORY: Peg tube in place. AMS. Sepsis. eval for any evidence of source. FINDINGS: CHEST: MEDIASTINUM AND LYMPH NODES: Left-sided pacemaker is present. There are atherosclerotic calcifications of the aorta and coronary arteries. The central airways are clear. No mediastinal, hilar or axillary lymphadenopathy. LUNGS AND PLEURA: There is a 2 mm nodule in the left upper lobemage 5/61. There is a trace left pleural effusion. No pneumothorax. No pulmonary edema. ABDOMEN AND PELVIS: LIVER: The liver is unremarkable. GALLBLADDER AND BILE DUCTS: Gallbladder is surgically absent. No biliary ductal dilatation. SPLEEN: No acute abnormality. PANCREAS: No acute abnormality. ADRENAL GLANDS: No acute abnormality. KIDNEYS, URETERS AND BLADDER: Complex cystic lesion measuring 3.6 cm in the superior pole of left kidney is grossly unchanged, but not well evaluated secondary to artifact from patient's arm positioning. The foley catheter decompresses the bladder. No stones in the kidneys or ureters. No hydronephrosis. No perinephric or periureteral stranding. 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 New  punctate radiopaque density in the region of the ascending colon, indeterminate. There is no bowel obstruction. Normal appendix. REPRODUCTIVE ORGANS: No acute abnormality. PERITONEUM AND RETROPERITONEUM: Mild stable presacral edema. No ascites. No free air. VASCULATURE: Aorta is normal in caliber. There are atherosclerotic calcifications of the aorta. ABDOMINAL AND PELVIS  LYMPH NODES: No lymphadenopathy. BONES AND SOFT TISSUES: The bones are diffusely osteopenic. Lower lumbar fusion hardware appears to be complicated. No focal soft tissue abnormality. IMPRESSION: 1. No acute findings. 2. Trace left pleural effusion. 3. New punctate radiopaque density in the region of the ascending colon, indeterminate. Correlate clinically for foreign bodies surgical history 4. Atherosclerotic calcifications of the aorta and coronary arteries. 5. Complex cystic lesion in the superior pole of the left kidney measuring 3.6 cm, grossly unchanged but inadequately evaluated due to artifact; recommend prompt renal protocol MRI or CT without and with contrast for characterization. Electronically signed by: Greig Pique MD 06/04/2024 08:08 PM EST RP Workstation: [id number]   CT Head Wo Contrast Result Date: 06/04/2024 EXAM: CT HEAD WITHOUT CONTRAST 06/04/2024 07:25:41 PM TECHNIQUE: CT of the head was performed without the administration of intravenous contrast. Automated exposure control, iterative reconstruction, and/or weight based adjustment of the mA/kV was utilized to reduce the radiation dose to as low as reasonably achievable. COMPARISON: 05/19/2024 CLINICAL HISTORY: ams FINDINGS: BRAIN AND VENTRICLES: No acute hemorrhage. No evidence of acute infarct. 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 Chronic right thalamic infarct. Moderate to severe chronic microvascular ischemic change. Generalized volume loss. ORBITS: No acute abnormality. SINUSES: No acute abnormality. SOFT TISSUES AND  SKULL: No acute soft tissue abnormality. No skull fracture. IMPRESSION: 1. No acute intracranial abnormality. 2. 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 3. Moderate to severe chronic microvascular ischemic change and generalized volume loss. Electronically signed by: Oneil Devonshire MD 06/04/2024 07:55 PM EST RP Workstation: [id number]   DG Chest Port 1 View Result Date: 06/04/2024 EXAM: 1 VIEW(S) XRAY OF THE CHEST 06/04/2024 05:54:00 PM COMPARISON: 05/19/2024 CLINICAL HISTORY: Questionable sepsis - evaluate for abnormality FINDINGS: LUNGS AND PLEURA: Low lung volume with elevated right hemidiaphragm. No focal pulmonary opacity. No pulmonary edema. No pleural effusion. No pneumothorax. HEART AND MEDIASTINUM: Aortic atherosclerosis. 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 No acute abnormality of the cardiac and mediastinal silhouettes. BONES AND SOFT TISSUES: No acute osseous abnormality. IMPRESSION: 1. No acute cardiopulmonary process identified. 2. Low lung volume with elevated right hemidiaphragm. Electronically signed by: Rockey Kilts MD 06/04/2024 06:27 PM EST RP Workstation: [id number]     Procedures   Medications Ordered in the ED  sorbitol 70 % solution 30 mL (has no administration in time range)  acetaminophen  (TYLENOL ) tablet 500 mg (has no administration in time range)    Or  acetaminophen  (TYLENOL ) suppository 650 mg (has no administration in time range)  ondansetron  (ZOFRAN ) tablet 4 mg (has no administration in time range)    Or  ondansetron  (ZOFRAN ) injection 4 mg (has no administration in time range)  piperacillin-tazobactam (ZOSYN) IVPB 3.375 g (0 g Intravenous Stopped 06/04/24 1841)  vancomycin  (VANCOREADY) IVPB 1750 mg/350 mL (0 mg Intravenous Stopped 06/04/24 2103)  sodium chloride  0.9 % bolus 1,000 mL (1,000 mLs Intravenous New Bag/Given 06/04/24 1838)  sodium chloride  0.9 % bolus 500  mL (500 mLs Intravenous New Bag/Given 06/04/24 2343)                                    Medical Decision Making Amount and/or Complexity of Data Reviewed Labs: ordered. Radiology: ordered.  Risk Prescription drug management. Decision regarding hospitalization.     HPI:     Presents with increasing altered  mental status.  According to EMS, lives at facility.  According facility, patient had increasing altered mental status since Sunday.  Usually able to talk but since Sunday has been having increasing decline in mentation.  No falls.  No reports of fevers.   Previous medical history reviewed : Patient was discharged on May 31, 2024.  Sepsis.  Pseudomonas UTI.  Jejunostomy tube cellulitis.  History of endocarditis.  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  MDM:   Upon exam, patient GCS of 10.  Opens eyes to pain.  Inappropriate words and localizes to pain.  No new focal deficit I could appreciate from beyond patient's baseline.  Appears to be more generalized encephalopathy as opposed any kind of acute focal deficits.  Will obtain CT head to rule out any Spontaneous bleed.  Obtaining CT chest as well as CT abdomen pelvis as well given unclear cause of patient's sepsis.  Start patient on fluid.  Start patient on vancomycin  and Zosyn for broad-spectrum coverage given recent hospital admission.   Will obtain UA.  Foley in place.  Will exchange this.  Reevaluation:   Upon reexamination, patient hemodynamically stable. Remains confused.   Patient received a liter of fluid.  Did not want to fluid overload the patient given appropriate response with maps.  Positive for sepsis.  As above, started on antibiotics.  Likely source of urine.  Urine culture sent  CT imaging showed no acute pathology to explain patient's presentation.     Interventions: 1 L nsr, vanc, zosyn   EKG Interpreted by Me: sinus     Cardiac Tele Interpreted by Me: sinus    I have independently interpreted the CXR  and CT  images and agree with the radiologist finding   Social Determinant of Health: Dementia    Disposition and Follow Up: admit    CRITICAL CARE Performed by: Lavonia LOISE Pat   Total critical care time: 45 minutes  Critical care time was exclusive of separately billable procedures and treating other patients.  Critical care was necessary to treat or prevent imminent or life-threatening deterioration.  Critical care was time spent personally by me on the following activities: development of treatment plan with patient and/or surrogate as well as nursing, discussions with consultants, evaluation of patient's response to treatment, examination of patient, obtaining history from patient or surrogate, ordering and performing treatments and interventions, ordering and review of laboratory studies, ordering and review of radiographic studies, pulse oximetry and re-evaluation of patient's condition.      Final diagnoses:  Delirium  Sepsis associated hypotension [facility])    ED Discharge Orders     None          Pat Lavonia LOISE, MD 06/04/24 2344

## 2024-06-04 NOTE — Sepsis Progress Note (Signed)
 Elink monitoring for the code sepsis protocol.

## 2024-06-04 NOTE — H&P (Signed)
 History and Physical    Patient: Kathleen Jensen FMW:[id number] DOB: [birth year] DOA: 06/04/2024 DOS: the patient was seen and examined on 06/04/2024 PCP: System, Provider Not In  Patient coming from: SNF  Chief Complaint:  Chief Complaint  Patient presents with   Altered Mental Status   HPI: Kathleen Jensen is a 73 y.o. female nursing home resident with medical history significant for history of CVA now bedbound with multiple contractures and with chronic PEG tube, indwelling Foley and seizure disorder.  She has a pacemaker, dementia, and aphasia.  The patient was diagnosed with MRSE endocarditis.  She received 6 weeks of IV cefazolin  and is now on lifetime suppression with cefadroxil.  The patient was sent in to the emergency department today because of decreased mental status which been going on for several days.  At baseline the patient is able to say a couple of words.  The she was just discharged from the hospital 5 days ago after being treated for Pseudomonas UTI and sepsis as well as J-tube cellulitis.  I admitted the patient to the hospital 2 weeks ago.  There is a drastic difference in the patient's condition since I last saw her.  The patient just moans and does not say any words.  When I admitted her a couple weeks ago she would parrot back what she said if you smiled that she would smile she would say okay yes or no and was able to be understood clearly.  This time she will hold my gaze for a second or 2 and then stares off into space.  She is not saying any coherent words.  She is just moaning.  Physically she has gained quite a bit of weight.  She has almost no spontaneous movements.  She looks ill. On arrival in the emergency department patient did meet sepsis criteria.  She had a stat CT of her head as well as a CT of her abdomen and pelvis looking for the source of possible sepsis.  She does have a chronic Foley but her urinalysis is fairly unremarkable.  The patient's  initial blood pressures were in the 90s but her blood pressures came up and remained good after fluids. Blood cultures were drawn.  With the chronic Foley it was felt that the urine would probably be her source but urinalysis was benign.  Her first temperature was low 97.5.  Her highest temp in the ED was 100.5. Her heart rate has been stable mainly in the 60s to 70s.  She does have a few vitals where her respiratory rate was in the 20s.  In the emergency department she was given IV Vanco and Zosyn empirically and hospitalist have been asked to admit. She is a ward of the state.   Review of Systems: unable to review all systems due to the inability of the patient to answer questions. 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)    Hyperlipemia    Hypertension    Insomnia    Intracerebral hemorrhage, intraventricular (HCC)    Muscle weakness    Other abnormalities of  gait and mobility    Seizures (HCC)    Stroke (HCC)    Unsteadiness on feet    Vitamin B deficiency    Past Surgical History:  Procedure Laterality Date   BACK SURGERY     Dec 18, 2016 Dr.Torrealba   BREAST BIOPSY Right    BREAST EXCISIONAL BIOPSY     CERVICAL SPINE SURGERY     C4   CESAREAN SECTION     GASTRIC BYPASS  03/2007   IR GJ TUBE CHANGE  11/16/2023   IR REPLC DUODEN/JEJUNO TUBE PERCUT W/FLUORO  01/27/2023   IR REPLC DUODEN/JEJUNO TUBE PERCUT W/FLUORO  03/07/2023   IR REPLC DUODEN/JEJUNO TUBE PERCUT W/FLUORO  04/30/2023   IR REPLC DUODEN/JEJUNO TUBE PERCUT W/FLUORO  09/02/2023   IR REPLC DUODEN/JEJUNO TUBE PERCUT W/FLUORO   09/24/2023   IR REPLC DUODEN/JEJUNO TUBE PERCUT W/FLUORO  04/02/2024   TONSILLECTOMY     TRANSESOPHAGEAL ECHOCARDIOGRAM (CATH LAB) N/A 04/01/2024   Procedure: TRANSESOPHAGEAL ECHOCARDIOGRAM;  Surgeon: Lonni Slain, MD;  Location: [facility];  Service: Cardiovascular;  Laterality: N/A;   Social History:  reports that she quit smoking about 44 years ago. She started smoking about 48 years ago. She has never used smokeless tobacco. She reports that she does not drink alcohol and does not use drugs.  Allergies  Allergen Reactions   Tape Rash   Demerol [Meperidine Hcl] Nausea And Vomiting   Sulfonamide Derivatives Itching   Bacitracin-Polymyxin B Dermatitis    Not listed on the MAR   Oxycontin  [Oxycodone ] Itching    Family History  Problem Relation Age of Onset   High blood pressure Mother    Diabetes Mother    Diabetes Father    Stroke Father    Cancer Sister        Colorectal cancer   Cancer Brother        colon    Prior to Admission medications   Medication Sig Start Date End Date Taking? Authorizing Provider  acetaminophen  (TYLENOL ) 160 MG/5ML solution Place 20 mLs (640 mg total) into feeding tube every 8 (eight) hours. 12/03/22   Elgergawy, Brayton RAMAN, MD  acetic acid  0.25 % irrigation 20cc irrigation twice a day, flush foley to prevent clogging 11/06/23   [provider]  amiodarone  (PACERONE ) 200 MG tablet Place 1 tablet (200 mg total) into feeding tube daily. 12/03/22   Elgergawy, Brayton RAMAN, MD  ascorbic acid  (VITAMIN C ) 500 MG tablet Place 0.5 tablets (250 mg total) into feeding tube 2 (two) times daily. 06/09/23   Rai, Nydia POUR, MD  aspirin  81 MG chewable tablet Place 81 mg into feeding tube at bedtime.    [provider]  cefadroxil (DURICEF) 500 MG/5ML suspension Take 10 mLs (1,000 mg total) by mouth every 12 (twelve) hours. 05/07/24 11/03/24  Vu, Constance T, MD  chlorhexidine  (PERIDEX ) 0.12 % solution Dab a toothbrush into 15 mLs and brush teeth 2  times a day    [provider]  Cranberry 425 MG CAPS Take 425 mg by mouth in the morning and at bedtime.    [provider]  cyclobenzaprine  (FLEXERIL ) 5 MG tablet Place 2.5 mg into feeding tube in the morning and at bedtime. Hold If Sedated    [provider]  diazePAM , 20 MG Dose, (VALTOCO  20 MG DOSE) 2 x 10 MG/0.1ML LQPK Place 20 mg into the nose daily as needed (seizures).    [provider]  docusate sodium  (COLACE) 100 MG capsule Give 1  capsule per tube twice a day    [provider]  estradiol (ESTRACE) 0.1 MG/GM vaginal cream Place vaginally See admin instructions. Apply a pea-sized amount to the urethra/vaginal wall on Tuesdays and Fridays    [provider]  famotidine  (PEPCID ) 40 MG/5ML suspension Place 40 mg into feeding tube daily. 03/25/22   [provider]  folic acid  (FOLVITE ) 800 MCG tablet Place 800 mcg into feeding tube daily.    [provider]  levETIRAcetam  (KEPPRA ) 100 MG/ML solution Place 1,000 mg into feeding tube 2 (two) times daily.    [provider]  liver oil-zinc  oxide (DESITIN) 40 % ointment Apply topically 2 (two) times daily. Apply Desitin around G tube site BID and change split thickness gauze each time 05/31/24   Cheryle Page, MD  metoprolol  tartrate (LOPRESSOR ) 25 MG tablet Place 1 tablet (25 mg total) into feeding tube 2 (two) times daily. 12/03/22   Elgergawy, Brayton RAMAN, MD  Multiple Vitamin (MULTIVITAMIN WITH MINERALS) TABS tablet Place 1 tablet into feeding tube daily. 04/06/24   Tharon Lung, MD  Nutritional Supplements (FEEDING SUPPLEMENT, OSMOLITE 1.2 CAL,) LIQD Place 1,000 mLs into feeding tube continuous. 05/31/24   Cheryle Page, MD  nystatin  ointment (MYCOSTATIN ) Apply 1 Application topically 3 (three) times daily. 04/26/24   [provider]  polyethylene glycol (MIRALAX  / GLYCOLAX ) 17 g packet Place 34 g into feeding tube daily. 06/09/23   Rai, Nydia POUR, MD  Protein  (FEEDING SUPPLEMENT, PROSOURCE TF20,) liquid Place 60 mLs into feeding tube daily. 06/01/24   Cheryle Page, MD  simethicone  (MYLICON) 80 MG chewable tablet Place 80 mg into feeding tube 3 (three) times daily.    [provider]  simvastatin  (ZOCOR ) 10 MG tablet Place 1 tablet (10 mg total) into feeding tube at bedtime. 02/22/22   Cheryle Page, MD  torsemide  (DEMADEX ) 10 MG tablet Place 10 mg into feeding tube every 8 (eight) hours. Give 10 mg per tube every 12 hours for edema, hold for a systolic reading less than 110    [provider]  valproic  acid (DEPAKENE ) 250 MG/5ML solution Place 10-15 mLs into feeding tube See admin instructions. 10 ml via tube three times a day and 15 ml at bedtime    [provider]  Water  For Irrigation, Sterile (FREE WATER ) SOLN Place 200 mLs into feeding tube every 4 (four) hours. 05/31/24   Cheryle Page, MD    Physical Exam: Vitals:   06/04/24 1800 06/04/24 1845 06/04/24 2101 06/04/24 2145  BP: 118/68 (!) 142/96 (!) 143/80 (!) 153/77  Pulse: 79 82 78 72  Resp: (!) 22 19 (!) 23 (!) 22  Temp:   (!) 100.5 F (38.1 C)   TempSrc:   Axillary   SpO2: 100% 92%  99%   Physical Exam:  General: unkempt and chronically ill appearing HEENT: Normocephalic, atraumatic, PERRL, Briskly reactive but very large cataracts. Pupils 5mm, Teeth are not cleaned Cardiovascular: Normal rate and rhythm. Distal pulses intact. Pulmonary: Normal pulmonary effort, normal breath sounds Gastrointestinal: Distended abdomen, tender diffusely, normoactive bowel sounds, Peg tube has discharge and surrounding skin is macerated and hyperpigmented and very tender  Musculoskeletal:No lower ext edema, very stiff in all joints contractured at wrists Lymphadenopathy: No cervical LAD. Skin: Skin is oily and unwashed. She seems to be tender everywhere she is touched  Neuro: Not interactive, no words just moans Does make eye contact briefly PSYCH: Eyes open but not  interactive  Data Reviewed:  Results for orders placed  or performed during the hospital encounter of 06/04/24 (from the past 24 hours)  Resp panel by RT-PCR (RSV, Flu A&B, Covid)     Status: None   Collection Time: 06/04/24  4:50 PM   Specimen: Nasal Swab  Result Value Ref Range   SARS Coronavirus 2 by RT PCR NEGATIVE NEGATIVE   Influenza A by PCR NEGATIVE NEGATIVE   Influenza B by PCR NEGATIVE NEGATIVE   Resp Syncytial Virus by PCR NEGATIVE NEGATIVE  Urinalysis, w/ Reflex to Culture (Infection Suspected) -Urine, Catheterized; Indwelling urinary catheter     Status: Abnormal   Collection Time: 06/04/24  4:52 PM  Result Value Ref Range   Specimen Source URINE, CATHETERIZED    Color, Urine YELLOW YELLOW   APPearance CLEAR CLEAR   Specific Gravity, Urine 1.015 1.005 - 1.030   pH 6.0 5.0 - 8.0   Glucose, UA NEGATIVE NEGATIVE mg/dL   Hgb urine dipstick NEGATIVE NEGATIVE   Bilirubin Urine NEGATIVE NEGATIVE   Ketones, ur NEGATIVE NEGATIVE mg/dL   Protein, ur NEGATIVE NEGATIVE mg/dL   Nitrite NEGATIVE NEGATIVE   Leukocytes,Ua TRACE (A) NEGATIVE   Squamous Epithelial / HPF 6-10 0 - 5 /HPF   WBC, UA 0-5 0 - 5 WBC/hpf   RBC / HPF 0-5 0 - 5 RBC/hpf   Bacteria, UA FEW (A) NONE SEEN   Hyaline Casts, UA PRESENT    Urine-Other MICROSCOPIC EXAM PERFORMED ON UNCONCENTRATED URINE   Comprehensive metabolic panel     Status: Abnormal   Collection Time: 06/04/24  5:11 PM  Result Value Ref Range   Sodium 131 (L) 135 - 145 mmol/L   Potassium 4.7 3.5 - 5.1 mmol/L   Chloride 96 (L) 98 - 111 mmol/L   CO2 27 22 - 32 mmol/L   Glucose, Bld 154 (H) 70 - 99 mg/dL   BUN 21 8 - 23 mg/dL   Creatinine, Ser 9.17 0.44 - 1.00 mg/dL   Calcium  7.7 (L) 8.9 - 10.3 mg/dL   Total Protein 8.8 (H) 6.5 - 8.1 g/dL   Albumin  <1.5 (L) 3.5 - 5.0 g/dL   AST 48 (H) 15 - 41 U/L   ALT 14 0 - 44 U/L   Alkaline Phosphatase 183 (H) 38 - 126 U/L   Total Bilirubin 0.9 0.0 - 1.2 mg/dL   GFR, Estimated >39 >39 mL/min   Anion  gap 8 5 - 15  CBC with Differential     Status: Abnormal   Collection Time: 06/04/24  5:11 PM  Result Value Ref Range   WBC 10.0 4.0 - 10.5 K/uL   RBC 2.98 (L) 3.87 - 5.11 MIL/uL   Hemoglobin 10.5 (L) 12.0 - 15.0 g/dL   HCT 67.4 (L) 63.9 - 53.9 %   MCV 109.1 (H) 80.0 - 100.0 fL   MCH 35.2 (H) 26.0 - 34.0 pg   MCHC 32.3 30.0 - 36.0 g/dL   RDW 83.3 (H) 88.4 - 84.4 %   Platelets 154 150 - 400 K/uL   nRBC 0.0 0.0 - 0.2 %   Neutrophils Relative % 59 %   Neutro Abs 6.0 1.7 - 7.7 K/uL   Lymphocytes Relative 23 %   Lymphs Abs 2.3 0.7 - 4.0 K/uL   Monocytes Relative 16 %   Monocytes Absolute 1.6 (H) 0.1 - 1.0 K/uL   Eosinophils Relative 1 %   Eosinophils Absolute 0.1 0.0 - 0.5 K/uL   Basophils Relative 0 %   Basophils Absolute 0.0 0.0 - 0.1 K/uL   Immature  Granulocytes 1 %   Abs Immature Granulocytes 0.06 0.00 - 0.07 K/uL  I-Stat Lactic Acid, ED     Status: Abnormal   Collection Time: 06/04/24  5:19 PM  Result Value Ref Range   Lactic Acid, Venous 3.2 (HH) 0.5 - 1.9 mmol/L   Comment NOTIFIED PHYSICIAN   Protime-INR     Status: Abnormal   Collection Time: 06/04/24  5:45 PM  Result Value Ref Range   Prothrombin Time 20.9 (H) 11.4 - 15.2 seconds   INR 1.7 (H) 0.8 - 1.2  I-Stat Lactic Acid, ED     Status: Abnormal   Collection Time: 06/04/24  8:19 PM  Result Value Ref Range   Lactic Acid, Venous 2.8 (HH) 0.5 - 1.9 mmol/L   Comment NOTIFIED PHYSICIAN      Assessment and Plan: Sepsis in a patient on daily Cefadroxil for endocarditis suppression -  - Source is unclear.  The patient has been started on Zosyn.  The patient's vitals have significantly improved.  Her heart rate is 92.  Respiratory rate 22. Bp 134/76 - During her last admission her urinalysis was unremarkable but her urine culture grew greater than 100,000 Pseudomonas. She was treate dwith Cipro.  - Consider ID consult in am Her jtube site looks painful with possible fungal infection due to leaking. She had a CT of her  abdomen pelvis 2 weeks ago which was unremarkable. - Lactic acid levels are elevated which is a frequent occurrence.  2.  Seizure disorder - continue outpatient medications  Her overall prognosis is poor.   \  Med list is pending   Advance Care Planning:   Code Status: Prior  The patient is not able to participate in discussion.  She will be full code by default.  Consults: none  Family Communication: none  Severity of Illness: The appropriate patient status for this patient is INPATIENT. Inpatient status is judged to be reasonable and necessary in order to provide the required intensity of service to ensure the patient's safety. The patient's presenting symptoms, physical exam findings, and initial radiographic and laboratory data in the context of their chronic comorbidities is felt to place them at high risk for further clinical deterioration. Furthermore, it is not anticipated that the patient will be medically stable for discharge from the hospital within 2 midnights of admission.   * I certify that at the point of admission it is my clinical judgment that the patient will require inpatient hospital care spanning beyond 2 midnights from the point of admission due to high intensity of service, high risk for further deterioration and high frequency of surveillance required.*  Author: ARTHEA CHILD, MD 06/04/2024 10:45 PM  For on call review [email or web address].

## 2024-06-04 NOTE — ED Triage Notes (Signed)
 Pt bib [facility] from [facility] for c/o infected g-tube. Pt found to be altered, lkw Sunday, progressively declining. At baseline can have a conversation and alert with aphasia.  140/90 270 cbg

## 2024-06-05 DIAGNOSIS — A419 Sepsis, unspecified organism: Secondary | ICD-10-CM | POA: Diagnosis not present

## 2024-06-05 DIAGNOSIS — R6520 Severe sepsis without septic shock: Secondary | ICD-10-CM | POA: Diagnosis not present

## 2024-06-05 LAB — URINE CULTURE: Culture: NO GROWTH

## 2024-06-05 LAB — RESPIRATORY PANEL BY PCR

## 2024-06-05 LAB — PROTIME-INR
INR: 1.4 — ABNORMAL HIGH (ref 0.8–1.2)
Prothrombin Time: 17.4 s — ABNORMAL HIGH (ref 11.4–15.2)

## 2024-06-05 LAB — CBC
HCT: 31.4 % — ABNORMAL LOW (ref 36.0–46.0)
Hemoglobin: 9.9 g/dL — ABNORMAL LOW (ref 12.0–15.0)
MCH: 34.5 pg — ABNORMAL HIGH (ref 26.0–34.0)
MCHC: 31.5 g/dL (ref 30.0–36.0)
MCV: 109.4 fL — ABNORMAL HIGH (ref 80.0–100.0)
Platelets: 144 K/uL — ABNORMAL LOW (ref 150–400)
RBC: 2.87 MIL/uL — ABNORMAL LOW (ref 3.87–5.11)
RDW: 16.8 % — ABNORMAL HIGH (ref 11.5–15.5)
WBC: 12.5 K/uL — ABNORMAL HIGH (ref 4.0–10.5)
nRBC: 0 % (ref 0.0–0.2)

## 2024-06-05 LAB — COMPREHENSIVE METABOLIC PANEL WITH GFR
ALT: 15 U/L (ref 0–44)
AST: 41 U/L (ref 15–41)
Albumin: <1.5 g/dL — ABNORMAL LOW (ref 3.5–5.0)
Alkaline Phosphatase: 140 U/L — ABNORMAL HIGH (ref 38–126)
Anion gap: 8 (ref 5–15)
BUN: 18 mg/dL (ref 8–23)
CO2: 23 mmol/L (ref 22–32)
Calcium: 7.6 mg/dL — ABNORMAL LOW (ref 8.9–10.3)
Chloride: 101 mmol/L (ref 98–111)
Creatinine, Ser: 0.8 mg/dL (ref 0.44–1.00)
GFR, Estimated: >60 mL/min (ref >60)
Glucose, Bld: 79 mg/dL (ref 70–99)
Potassium: 4.5 mmol/L (ref 3.5–5.1)
Sodium: 132 mmol/L — ABNORMAL LOW (ref 135–145)
Total Bilirubin: 0.9 mg/dL (ref 0.0–1.2)
Total Protein: 8.2 g/dL — ABNORMAL HIGH (ref 6.5–8.1)

## 2024-06-05 LAB — GLUCOSE, CAPILLARY
Glucose-Capillary: 98 mg/dL (ref 70–99)
Glucose-Capillary: 89 mg/dL (ref 70–99)

## 2024-06-05 LAB — MAGNESIUM: Magnesium: 1.8 mg/dL (ref 1.7–2.4)

## 2024-06-05 LAB — PHOSPHORUS: Phosphorus: 3.1 mg/dL (ref 2.5–4.6)

## 2024-06-05 MED ORDER — ASPIRIN 81 MG PO CHEW
81.0000 mg | CHEWABLE_TABLET | Freq: Every day | ORAL | Status: DC
  Administered 2024-06-05: 81 mg
  Filled 2024-06-05: qty 1

## 2024-06-05 MED ORDER — MORPHINE SULFATE (PF) 2 MG/ML IV SOLN: 2.0000 mg | Freq: Once | INTRAVENOUS | Status: DC

## 2024-06-05 MED ORDER — VALPROATE SODIUM 100 MG/ML IV SOLN
750.0000 mg | Freq: Every day | INTRAVENOUS | Status: DC
  Administered 2024-06-05: 750 mg via INTRAVENOUS
  Filled 2024-06-05 (×2): qty 7.5

## 2024-06-05 MED ORDER — PIPERACILLIN-TAZOBACTAM 3.375 G IVPB
3.3750 g | Freq: Three times a day (TID) | INTRAVENOUS | Status: AC
  Administered 2024-06-06 – 2024-06-14 (×26): 3.375 g via INTRAVENOUS
  Filled 2024-06-05 (×29): qty 50

## 2024-06-05 MED ORDER — VITAMIN C 500 MG PO TABS
250.0000 mg | ORAL_TABLET | Freq: Two times a day (BID) | ORAL | Status: DC
  Administered 2024-06-05 (×2): 250 mg
  Filled 2024-06-05 (×2): qty 1

## 2024-06-05 MED ORDER — GERHARDT'S BUTT CREAM
TOPICAL_CREAM | Freq: Three times a day (TID) | CUTANEOUS | Status: DC
  Administered 2024-06-09: 1 via TOPICAL
  Filled 2024-06-05 (×2): qty 60

## 2024-06-05 MED ORDER — VALPROIC ACID 250 MG/5ML PO SOLN: 500.0000 mg | ORAL | Status: DC

## 2024-06-05 MED ORDER — COLLAGENASE 250 UNIT/GM EX OINT
TOPICAL_OINTMENT | Freq: Every day | CUTANEOUS | Status: DC
  Filled 2024-06-05 (×2): qty 30

## 2024-06-05 MED ORDER — VALPROIC ACID 250 MG/5ML PO SOLN
750.0000 mg | Freq: Every day | ORAL | Status: DC
  Administered 2024-06-05: 750 mg
  Filled 2024-06-05: qty 15
  Filled 2024-06-05: qty 5
  Filled 2024-06-05: qty 15

## 2024-06-05 MED ORDER — VALPROATE SODIUM 100 MG/ML IV SOLN
500.0000 mg | Freq: Three times a day (TID) | INTRAVENOUS | Status: DC
  Administered 2024-06-06: 500 mg via INTRAVENOUS
  Filled 2024-06-05 (×5): qty 5

## 2024-06-05 MED ORDER — OSMOLITE 1.2 CAL PO LIQD
1000.0000 mL | ORAL | Status: DC
  Administered 2024-06-06: 1000 mL
  Filled 2024-06-05 (×2): qty 1000

## 2024-06-05 MED ORDER — PIPERACILLIN-TAZOBACTAM 3.375 G IVPB 30 MIN
3.3750 g | Freq: Once | INTRAVENOUS | Status: AC
  Administered 2024-06-05: 3.375 g via INTRAVENOUS
  Filled 2024-06-05 (×2): qty 50

## 2024-06-05 MED ORDER — AMIODARONE HCL 200 MG PO TABS
200.0000 mg | ORAL_TABLET | Freq: Every day | ORAL | Status: DC
  Administered 2024-06-05 – 2024-06-15 (×9): 200 mg
  Filled 2024-06-05 (×9): qty 1

## 2024-06-05 MED ORDER — CEFADROXIL 500 MG PO CAPS
1000.0000 mg | ORAL_CAPSULE | Freq: Two times a day (BID) | ORAL | Status: DC
  Administered 2024-06-05 (×2): 1000 mg
  Filled 2024-06-05 (×4): qty 2

## 2024-06-05 MED ORDER — FAMOTIDINE 40 MG/5ML PO SUSR
40.0000 mg | Freq: Every day | ORAL | Status: DC
  Filled 2024-06-05 (×4): qty 5

## 2024-06-05 MED ORDER — SIMETHICONE 80 MG PO CHEW
80.0000 mg | CHEWABLE_TABLET | Freq: Three times a day (TID) | ORAL | Status: DC
  Administered 2024-06-05 – 2024-06-15 (×27): 80 mg
  Filled 2024-06-05 (×29): qty 1

## 2024-06-05 MED ORDER — FOLIC ACID 1 MG PO TABS
1000.0000 ug | ORAL_TABLET | Freq: Every day | ORAL | Status: DC
  Administered 2024-06-05: 1 mg
  Filled 2024-06-05: qty 1

## 2024-06-05 MED ORDER — CHLORHEXIDINE GLUCONATE CLOTH 2 % EX PADS
6.0000 | MEDICATED_PAD | Freq: Every day | CUTANEOUS | Status: DC
  Administered 2024-06-05 – 2024-06-15 (×11): 6 via TOPICAL

## 2024-06-05 MED ORDER — THIAMINE MONONITRATE 100 MG PO TABS: 100.0000 mg | ORAL_TABLET | Freq: Every day | ORAL | Status: DC

## 2024-06-05 MED ORDER — POLYETHYLENE GLYCOL 3350 17 G PO PACK
34.0000 g | PACK | Freq: Every day | ORAL | Status: DC
  Administered 2024-06-05: 34 g
  Filled 2024-06-05: qty 2

## 2024-06-05 MED ORDER — DOCUSATE SODIUM 50 MG/5ML PO LIQD
100.0000 mg | Freq: Two times a day (BID) | ORAL | Status: DC
  Administered 2024-06-05 (×2): 100 mg
  Filled 2024-06-05 (×4): qty 10

## 2024-06-05 MED ORDER — DOCUSATE SODIUM 100 MG PO CAPS
100.0000 mg | ORAL_CAPSULE | Freq: Two times a day (BID) | ORAL | Status: DC
  Filled 2024-06-05: qty 1

## 2024-06-05 MED ORDER — LEVETIRACETAM 100 MG/ML PO SOLN
1000.0000 mg | Freq: Two times a day (BID) | ORAL | Status: DC
  Administered 2024-06-05 (×3): 1000 mg
  Filled 2024-06-05 (×5): qty 10

## 2024-06-05 MED ORDER — VALPROIC ACID 250 MG/5ML PO SOLN
500.0000 mg | Freq: Three times a day (TID) | ORAL | Status: DC
  Administered 2024-06-05 (×2): 500 mg
  Filled 2024-06-05 (×5): qty 10

## 2024-06-05 MED ORDER — VANCOMYCIN HCL 1500 MG/300ML IV SOLN
1500.0000 mg | INTRAVENOUS | Status: DC
  Administered 2024-06-06: 1500 mg via INTRAVENOUS
  Filled 2024-06-05: qty 300

## 2024-06-05 MED ORDER — PROSOURCE TF20 ENFIT COMPATIBL EN LIQD
60.0000 mL | Freq: Every day | ENTERAL | Status: DC
  Filled 2024-06-05: qty 60

## 2024-06-05 NOTE — Consult Note (Signed)
 WOC Nurse Consult Note: Reason for Consult: sacral wound  Wound type: 1 Unstageable Pressure Injury sacrum 100% black tan necrotic tissue  2.  Moisture Associated Skin Damage to buttocks with scattered pink moist areas; peeling skin  ICD-10 CM Codes for Irritant Dermatitis  L24A2 - Due to fecal, urinary or dual incontinence  Pressure Injury POA: Yes Measurement: see nursing flowsheet  Wound bed: as above  Drainage (amount, consistency, odor) see nursing flowsheet  Periwound: buttocks with dark chronic appearing discoloration to suggest chronic tissue damage; ? Component of fungal component  Dressing procedure/placement/frequency:  Cleanse sacral wound with Vashe, do not rinse and allow to air dry.  Apply 1/4 thick layer of Santyl to wound bed, top with saline moist gauze, dry gauze and silicone foam.  Cleanse buttocks with Vashe, apply Gerhardt's Butt Cream 3 times a day and prn soiling.    POC discussed with bedside nurse. WOC team will not follow. Re-consult if further needs arise.   Thank you,    Powell Bar MSN, RN-BC, [employer]

## 2024-06-05 NOTE — ED Notes (Signed)
 Okay to use tube for meds per Sigdel MD via secure chat, otherwise NPO maintained.

## 2024-06-05 NOTE — ED Notes (Signed)
 Bed sore found on pt during assessment with provider Sigdel MD, large purple discoloration on sacrale area with tunneling wound between buttocks.

## 2024-06-05 NOTE — Care Management CC44 (Signed)
 Condition Code 44 Documentation Completed  Patient Details  Name: Kathleen Jensen MRN: [id number] Date of Birth: 04-Feb-1951   Condition Code 44 given:    Patient signature on Condition Code 44 notice:    Documentation of 2 MD's agreement:    Code 44 added to claim:     Per chart review patient is a ward of the state. Called Legal Guardian [name] at [phone]. No answer, unable to leave voicemail   Marval Gell, RN 06/05/2024, 4:17 PM

## 2024-06-05 NOTE — Progress Notes (Addendum)
 PROGRESS NOTE    Kathleen Jensen  FMW:[id number] DOB: Apr 20, 1951 DOA: 06/04/2024 PCP: System, Provider Not In   Brief Narrative:   73 year old female nursing home resident with medical history significant for CVA, bedbound with multiple contractures, PEG tube, indwelling Foley and seizure disorder, pacemaker, dementia and aphasia.  She has a recent treatment for MRSA endocarditis with IV cefazolin  and is currently on lifetime suppression with cefadroxil.  She was just recently admitted and treated for Pseudomonas UTI.  Patient was brought to the emergency department because of decreased mental status going on for past several days.  Per reports, at baseline patient is able to say a couple of words.  At this time she just moans and does not say any words.  Patient has also gained quite a bit of weight.  Appears ill in the ER. On arrival in the emergency department, patient did meet sepsis criteria.  He stat head CT was negative for acute findings.  A stat CT of abdomen and pelvis, chest negative for acute abnormalities.  She has chronic Foley but UA is fairly unremarkable.  Patient's initial blood pressure was in 90s which improved with IV fluid.  She has been afebrile, later noted to have low-grade temp of 100.5 daily Fahrenheit.  Heart rate is in 60s to 70s.  Patient was initiated on IV vancomycin , Zosyn with blood culture pending. She is a ward of the state.  Assessment & Plan:   Principal Problem:   Sepsis (HCC) Active Problems:   Seizures (HCC)   OBSTRUCTIVE SLEEP APNEA   Essential hypertension   History of expressive aphasia   Pulmonary embolism and infarction (HCC)   PAF (paroxysmal atrial fibrillation) (HCC)   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HCC)   Bedbound   Assessment and Plan: Sepsis in a patient on daily Cefadroxil for endocarditis suppression -  - Source is unclear.  ?  Infection around PEG.  It does however look more chronic inflammation with some  acute component -UA fairly unremarkable -CT chest abdomen and pelvis negative for acute findings. - Head CT negative for acute findings - Low-grade temperature noted.  Vital signs are stable.  Elevated lactic acid is an improving trend. - Will continue vancomycin  and Zosyn, follow blood cultures  2. Sacral ulcer: will consult wound care, doesn't appear infected  3.  Seizure disorder - continue outpatient medications  4. Continue PEG feeding   Her overall prognosis is poor.        Advance Care Planning:   Code Status: Prior  The patient is not able to participate in discussion.  She will be full code by default.   Consults: none   Family Communication: none   Severity of Illness: The appropriate patient status for this patient is INPATIENT. Inpatient status is judged to be reasonable and necessary in order to provide the required intensity of service to ensure the patient's safety. The patient's presenting symptoms, physical exam findings, and initial radiographic and laboratory data in the context of their chronic comorbidities is felt to place them at high risk for further clinical deterioration. Furthermore, it is not anticipated that the patient will be medically stable for discharge from the hospital within 2 midnights of admission.    * I certify that at the point of admission it is my clinical judgment that the patient will require inpatient hospital care spanning beyond 2 midnights from the point of admission due to high intensity of service, high risk for further deterioration and high frequency of  surveillance required.*     Subjective:  Patient seen and examined at the bedside.  She is acutely ill.  She keeps moaning.  Does not follow commands.  Vital signs are stable.  Objective: Vitals:   06/05/24 0500 06/05/24 0600 06/05/24 0926 06/05/24 0937  BP: 113/65 102/72 108/70   Pulse: 91 81 81   Resp: (!) 27 (!) 25 20   Temp: 99.7 F (37.6 C)   98.9 F (37.2 C)   TempSrc: Rectal   Axillary  SpO2: 100% 100% 97%     Intake/Output Summary (Last 24 hours) at 06/05/2024 1046 Last data filed at 06/05/2024 0414 Gross per 24 hour  Intake 1550 ml  Output 800 ml  Net 750 ml   There were no vitals filed for this visit.  Examination:  General: unkempt and chronically ill appearing HEENT: Normocephalic, atraumatic, PERRL, Briskly reactive but very large cataracts. Pupils 5mm, Teeth are not cleaned Cardiovascular: Normal rate and rhythm. Distal pulses intact. Pulmonary: Normal pulmonary effort, normal breath sounds Gastrointestinal: Distended abdomen, tender diffusely, normoactive bowel sounds, Peg tube has discharge and surrounding skin is macerated and hyperpigmented and very tender  Musculoskeletal:No lower ext edema, very stiff in all joints contractured at wrists Lymphadenopathy: No cervical LAD. Skin: Skin is oily and unwashed. She seems to be tender everywhere she is touched  Neuro: Not interactive, no words just moans Does make eye contact briefly PSYCH: Eyes open but not interactive   Data Reviewed: I have personally reviewed following labs and imaging studies  CBC: Recent Labs  Lab 06/04/24 1711 06/05/24 0500  WBC 10.0 12.5*  NEUTROABS 6.0  --   HGB 10.5* 9.9*  HCT 32.5* 31.4*  MCV 109.1* 109.4*  PLT 154 144*   Basic Metabolic Panel: Recent Labs  Lab 06/04/24 1711 06/05/24 0500  NA 131* 132*  K 4.7 4.5  CL 96* 101  CO2 27 23  GLUCOSE 154* 79  BUN 21 18  CREATININE 0.82 0.80  CALCIUM  7.7* 7.6*   GFR: Estimated Creatinine Clearance: 84.1 mL/min (by C-G formula based on SCr of 0.8 mg/dL). Liver Function Tests: Recent Labs  Lab 06/04/24 1711 06/05/24 0500  AST 48* 41  ALT 14 15  ALKPHOS 183* 140*  BILITOT 0.9 0.9  PROT 8.8* 8.2*  ALBUMIN  <1.5* <1.5*   No results for input(s): LIPASE, AMYLASE in the last 168 hours. No results for input(s): AMMONIA in the last 168 hours. Coagulation Profile: Recent Labs   Lab 06/04/24 1745 06/05/24 0500  INR 1.7* 1.4*   Cardiac Enzymes: No results for input(s): CKTOTAL, CKMB, CKMBINDEX, TROPONINI in the last 168 hours. BNP (last 3 results) No results for input(s): PROBNP in the last 8760 hours. HbA1C: No results for input(s): HGBA1C in the last 72 hours. CBG: Recent Labs  Lab 05/30/24 2017 05/30/24 2348 05/31/24 0401 05/31/24 0749 05/31/24 1124  GLUCAP 148* 150* 153* 162* 200*   Lipid Profile: No results for input(s): CHOL, HDL, LDLCALC, TRIG, CHOLHDL, LDLDIRECT in the last 72 hours. Thyroid  Function Tests: No results for input(s): TSH, T4TOTAL, FREET4, T3FREE, THYROIDAB in the last 72 hours. Anemia Panel: No results for input(s): VITAMINB12, FOLATE, FERRITIN, TIBC, IRON, RETICCTPCT in the last 72 hours. Sepsis Labs: Recent Labs  Lab 06/04/24 1719 06/04/24 2019  LATICACIDVEN 3.2* 2.8*    Recent Results (from the past 240 hours)  Resp panel by RT-PCR (RSV, Flu A&B, Covid)     Status: None   Collection Time: 06/04/24  4:50 PM   Specimen: Nasal  Swab  Result Value Ref Range Status   SARS Coronavirus 2 by RT PCR NEGATIVE NEGATIVE Final   Influenza A by PCR NEGATIVE NEGATIVE Final   Influenza B by PCR NEGATIVE NEGATIVE Final    Comment: (NOTE) The Xpert Xpress SARS-CoV-2/FLU/RSV plus assay is intended as an aid in the diagnosis of influenza from Nasopharyngeal swab specimens and should not be used as a sole basis for treatment. Nasal washings and aspirates are unacceptable for Xpert Xpress SARS-CoV-2/FLU/RSV testing.  Fact Sheet for Patients: bloggercourse.com  Fact Sheet for Healthcare Providers: seriousbroker.it  This test is not yet approved or cleared by the [place]  FDA and has been authorized for detection and/or diagnosis of SARS-CoV-2 by FDA under an Emergency Use Authorization (EUA). This EUA will remain in effect (meaning  this test can be used) for the duration of the COVID-19 declaration under Section 564(b)(1) of the Act, 21 U.S.C. section 360bbb-3(b)(1), unless the authorization is terminated or revoked.     Resp Syncytial Virus by PCR NEGATIVE NEGATIVE Final    Comment: (NOTE) Fact Sheet for Patients: bloggercourse.com  Fact Sheet for Healthcare Providers: seriousbroker.it  This test is not yet approved or cleared by the [place]  FDA and has been authorized for detection and/or diagnosis of SARS-CoV-2 by FDA under an Emergency Use Authorization (EUA). This EUA will remain in effect (meaning this test can be used) for the duration of the COVID-19 declaration under Section 564(b)(1) of the Act, 21 U.S.C. section 360bbb-3(b)(1), unless the authorization is terminated or revoked.  Performed at [facility], [address] [place], [place] [address]   Blood Culture (routine x 2)     Status: None (Preliminary result)   Collection Time: 06/04/24  4:55 PM   Specimen: BLOOD RIGHT ARM  Result Value Ref Range Status   Specimen Description BLOOD RIGHT ARM  Final   Special Requests   Final    BOTTLES DRAWN AEROBIC ONLY Blood Culture results may not be optimal due to an inadequate volume of blood received in culture bottles   Culture   Final    NO GROWTH < 24 HOURS Performed at [facility], [address], [place], [place] [address]    Report Status PENDING  Incomplete  Blood Culture (routine x 2)     Status: None (Preliminary result)   Collection Time: 06/04/24  5:35 PM   Specimen: BLOOD LEFT HAND  Result Value Ref Range Status   Specimen Description BLOOD LEFT HAND  Final   Special Requests   Final    BOTTLES DRAWN AEROBIC AND ANAEROBIC Blood Culture results may not be optimal due to an inadequate volume of blood received in culture bottles   Culture   Final    NO GROWTH < 24 HOURS Performed at [facility], [address], [place], [place] [address]    Report Status PENDING  Incomplete  Respiratory (~20 pathogens) panel by PCR     Status: None   Collection Time: 06/05/24  2:30 AM   Specimen: Nasopharyngeal Swab; Respiratory  Result Value Ref Range Status   Adenovirus NOT DETECTED NOT DETECTED Final   Coronavirus 229E NOT DETECTED NOT DETECTED Final    Comment: (NOTE) The Coronavirus on the Respiratory Panel, DOES NOT test for the novel  Coronavirus (2019 nCoV)    Coronavirus HKU1 NOT DETECTED NOT DETECTED Final   Coronavirus NL63 NOT DETECTED NOT DETECTED Final   Coronavirus OC43 NOT DETECTED NOT DETECTED Final  Metapneumovirus NOT DETECTED NOT DETECTED Final   Rhinovirus / Enterovirus NOT DETECTED NOT DETECTED Final   Influenza A NOT DETECTED NOT DETECTED Final   Influenza B NOT DETECTED NOT DETECTED Final   Parainfluenza Virus 1 NOT DETECTED NOT DETECTED Final   Parainfluenza Virus 2 NOT DETECTED NOT DETECTED Final   Parainfluenza Virus 3 NOT DETECTED NOT DETECTED Final   Parainfluenza Virus 4 NOT DETECTED NOT DETECTED Final   Respiratory Syncytial Virus NOT DETECTED NOT DETECTED Final   Bordetella pertussis NOT DETECTED NOT DETECTED Final   Bordetella Parapertussis NOT DETECTED NOT DETECTED Final   Chlamydophila pneumoniae NOT DETECTED NOT DETECTED Final   Mycoplasma pneumoniae NOT DETECTED NOT DETECTED Final    Comment: Performed at [facility], [address] [place], [place] [address]         Radiology Studies: CT CHEST ABDOMEN PELVIS WO CONTRAST Result Date: 06/04/2024 EXAM: CT CHEST, ABDOMEN AND PELVIS WITHOUT CONTRAST 06/04/2024 07:26:13 PM TECHNIQUE: CT of the chest, abdomen and pelvis was performed without the administration of intravenous contrast. Multiplanar reformatted images are provided for review. Automated exposure control, iterative reconstruction, and/or weight based adjustment of the mA/kV was utilized to reduce the radiation dose to as low as reasonably  achievable. COMPARISON: CT abdomen and pelvis 05/19/2024. CLINICAL HISTORY: Peg tube in place. AMS. Sepsis. eval for any evidence of source. FINDINGS: CHEST: MEDIASTINUM AND LYMPH NODES: Left-sided pacemaker is present. There are atherosclerotic calcifications of the aorta and coronary arteries. The central airways are clear. No mediastinal, hilar or axillary lymphadenopathy. LUNGS AND PLEURA: There is a 2 mm nodule in the left upper lobemage 5/61. There is a trace left pleural effusion. No pneumothorax. No pulmonary edema. ABDOMEN AND PELVIS: LIVER: The liver is unremarkable. GALLBLADDER AND BILE DUCTS: Gallbladder is surgically absent. No biliary ductal dilatation. SPLEEN: No acute abnormality. PANCREAS: No acute abnormality. ADRENAL GLANDS: No acute abnormality. KIDNEYS, URETERS AND BLADDER: Complex cystic lesion measuring 3.6 cm in the superior pole of left kidney is grossly unchanged, but not well evaluated secondary to artifact from patient's arm positioning. The foley catheter decompresses the bladder. No stones in the kidneys or ureters. No hydronephrosis. No perinephric or periureteral stranding. 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 New punctate radiopaque density in the region of the ascending colon, indeterminate. There is no bowel obstruction. Normal appendix. REPRODUCTIVE ORGANS: No acute abnormality. PERITONEUM AND RETROPERITONEUM: Mild stable presacral edema. No ascites. No free air. VASCULATURE: Aorta is normal in caliber. There are atherosclerotic calcifications of the aorta. ABDOMINAL AND PELVIS LYMPH NODES: No lymphadenopathy. BONES AND SOFT TISSUES: The bones are diffusely osteopenic. Lower lumbar fusion hardware appears to be complicated. No focal soft tissue abnormality. IMPRESSION: 1. No acute findings. 2. Trace left pleural effusion. 3. New punctate radiopaque density in the region of the ascending colon, indeterminate. Correlate clinically for  foreign bodies surgical history 4. Atherosclerotic calcifications of the aorta and coronary arteries. 5. Complex cystic lesion in the superior pole of the left kidney measuring 3.6 cm, grossly unchanged but inadequately evaluated due to artifact; recommend prompt renal protocol MRI or CT without and with contrast for characterization. Electronically signed by: Greig Pique MD 06/04/2024 08:08 PM EST RP Workstation: [id number]   CT Head Wo Contrast Result Date: 06/04/2024 EXAM: CT HEAD WITHOUT CONTRAST 06/04/2024 07:25:41 PM TECHNIQUE: CT of the head was performed without the administration of intravenous contrast. Automated exposure control, iterative reconstruction, and/or weight based adjustment of the mA/kV  was utilized to reduce the radiation dose to as low as reasonably achievable. COMPARISON: 05/19/2024 CLINICAL HISTORY: ams FINDINGS: BRAIN AND VENTRICLES: No acute hemorrhage. No evidence of acute infarct. 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 Chronic right thalamic infarct. Moderate to severe chronic microvascular ischemic change. Generalized volume loss. ORBITS: No acute abnormality. SINUSES: No acute abnormality. SOFT TISSUES AND SKULL: No acute soft tissue abnormality. No skull fracture. IMPRESSION: 1. No acute intracranial abnormality. 2. 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 3. Moderate to severe chronic microvascular ischemic change and generalized volume loss. Electronically signed by: Oneil Devonshire MD 06/04/2024 07:55 PM EST RP Workstation: [id number]   DG Chest Port 1 View Result Date: 06/04/2024 EXAM: 1 VIEW(S) XRAY OF THE CHEST 06/04/2024 05:54:00 PM COMPARISON: 05/19/2024 CLINICAL HISTORY: Questionable sepsis - evaluate for abnormality FINDINGS: LUNGS AND PLEURA: Low lung volume with elevated right hemidiaphragm. No focal pulmonary opacity. No pulmonary  edema. No pleural effusion. No pneumothorax. HEART AND MEDIASTINUM: Aortic atherosclerosis. 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 No acute abnormality of the cardiac and mediastinal silhouettes. BONES AND SOFT TISSUES: No acute osseous abnormality. IMPRESSION: 1. No acute cardiopulmonary process identified. 2. Low lung volume with elevated right hemidiaphragm. Electronically signed by: Rockey Kilts MD 06/04/2024 06:27 PM EST RP Workstation: [id number]        Scheduled Meds:  amiodarone   200 mg Per Tube Daily   cefadroxil  1,000 mg Per Tube Q12H   levETIRAcetam   1,000 mg Per Tube BID   simethicone   80 mg Per Tube TID   valproic  acid  500 mg Per Tube TID WC   And   valproic  acid  750 mg Per Tube QHS   Continuous Infusions:        Skyeler Smola, MD [facility] 06/05/2024, 10:46 AM

## 2024-06-05 NOTE — ED Notes (Signed)
 Patient changed and in a clean brief.

## 2024-06-05 NOTE — Progress Notes (Addendum)
 Nutrition Follow-up  DOCUMENTATION CODES:   Not applicable  INTERVENTION:   - Initiate tube feeding via J-tube: Osmolite 1.2 at 60 ml/h (1440 ml per day) Initiate at 20ml/hr and increase by 10ml/hr q8h until goal rate achieved Prosource TF20 60 ml daily Provides 1808 kcal, 100 gm protein, 1181 ml free water  daily   - Monitor magnesium , potassium, and phosphorus, MD to replete as needed.             - Add-on PHOS/Mg to morning lab draw; low PHOS last admission which required repletion   -Add 100mg  thiamine  x5 days    - Monitor weight trends; needs new weight this admission     NUTRITION DIAGNOSIS:  Inadequate oral intake related to inability to eat as evidenced by NPO status.  GOAL:  Patient will meet greater than or equal to 90% of their needs  MONITOR:  Weight trends, Labs, TF tolerance, Skin  REASON FOR ASSESSMENT:   Consult Enteral/tube feeding initiation and management  ASSESSMENT:   Pt with PMH significant for: CVA w/ hemiplegia and contractures/aphasia, nursing home resident who is bed bound/ J-tube dependent, GERD, pacemaker, seizure disorder, gastric bypass (2008) and advanced dementia. Returns from nursing home d/t decline in mental status.  Recently admitted 10/22 - 11/03 from nursing home for sepsis/AKI due to Psuedomonas UTI, with J-tube cellulitis, which was found to be in appropriate position. She was able to achieve goal rate prior to discharge. Prior to that, admitted 8/28 - 9/09 this year for endocarditis. Remains on lifetime cefadroxil for suppression of endocarditis.    Consult received for re-initiation of tube feeds. RD working remotely. Patient not a reliable historian or able to answer questions currently, per chart review. Chart review completed given her recent admission. Noted hx of gastric bypass in 2008.   Met sepsis criteria on arrival, however workup unrevealing thus far. Admitting MD notes she has gained quite a bit of weight as well compared  to she last saw patient approximately two weeks ago. Initial exam by MD with documented discharge at J-tube site and surrounding skin is macerated. Concern for possible fungal infection.   Patient has been on formula Osmolite since starting tube feeds >2 years ago and tolerating well. Will plan to continue current tube feed regimen and assess tolerance.   Patient is said to have a G-tube in multiple provider notes, however appears to be J-tube after chart review.    Previous RD notes back in September indicate patient has previously been on Osmolite 1.5, getting cyclic feeds to space TF around Dilantin  administration. Patient is not currently on dilantin  and this is not listed in her home meds and thus she was started on continuous feeding regimen during last admission.    Of note, patient's phosphorus low last admission and required repletion. No new Mg/PHOS draw since admission. Will recommend add-on to assess trend and monitor for refeeding given inability to confirm if TF regimen being administered appropriately PTA. Also with some leaking noted.      Admit weight: 109.9 kg - no new weight this admission  No new weight this admission. Will request to assess recent trend. Generalized, non-pitting edema documented. Multiple skin integrity issues at sacral/coccyx areas likely d/t bed bound status.    Colace and Miralax  d/c last admission due to loose stools with re-initiation of tube feedings. Monitor the need to do so this admission.    Medications reviewed and include: ascorbic acid , Colace, famotidine , folic acid , simethicone , Miralax    Labs reviewed:  Na 132 K 4.5 PHOS 2.1 (11/01) Mg 3.2 (11/01) Lactic Acid 2.8 (H) WBC 12.5 (H) HA1C 5.4 Blood Glucose 79-154 x24 hours   NUTRITION - FOCUSED PHYSICAL EXAM: Unable to complete, will defer to next in-person assessment   Diet Order:   Diet Order             Diet NPO time specified  Diet effective now                    EDUCATION NEEDS:   No education needs have been identified at this time  Skin:  Skin Assessment: Skin Integrity Issues: Skin Integrity Issues:: DTI DTI: sacral  Last BM:  PTA  Height:  Ht Readings from Last 1 Encounters:  05/20/24 5' 10 (1.778 m)   Weight:  Wt Readings from Last 1 Encounters:  05/31/24 109.9 kg   Ideal Body Weight:  68.2 kg  BMI:  There is no height or weight on file to calculate BMI.  Estimated Nutritional Needs:   Kcal:  1700-1900 kcals  Protein:  90-105g  Fluid:  1.7-1.9L/day  Blair Deaner MS, RD, LDN Registered Dietitian Clinical Nutrition RD Inpatient Contact Info in Amion

## 2024-06-05 NOTE — Care Management Obs Status (Signed)
 MEDICARE OBSERVATION STATUS NOTIFICATION   Patient Details  Name: Kathleen Jensen MRN: [id number] Date of Birth: 12-Sep-1950   Medicare Observation Status Notification Given:  No    Marval Gell, RN 06/05/2024, 4:17 PM

## 2024-06-05 NOTE — Progress Notes (Signed)
 Pharmacy Antibiotic Note  Kathleen Jensen is a 73 y.o. female admitted on 06/04/2024 with sepsis.  Pharmacy has been consulted for vanc/zosyn dosing.  Pt with with a hx of endocarditis that is chronic suppression with cefadroxil. She presented with fevers so vanc/zosyn have been ordered to r/o sepsis. Ok to hold cefadroxil while on vanc/zosyn per Dr. Mcarthur. She got vanc x1 in the ED on 11/7  Scr<1  Plan: Vanc 1.5g IV q24>>AUC 485, scr 0.8 Zosyn 3.375g IV q8 F/u with cultures  Height: 5' 10 (177.8 cm) Weight: 102.4 kg (225 lb 12 oz) IBW/kg (Calculated) : 68.5  Temp (24hrs), Avg:99.2 F (37.3 C), Min:98.3 F (36.8 C), Max:100.5 F (38.1 C)  Recent Labs  Lab 06/04/24 1711 06/04/24 1719 06/04/24 2019 06/05/24 0500  WBC 10.0  --   --  12.5*  CREATININE 0.82  --   --  0.80  LATICACIDVEN  --  3.2* 2.8*  --     Estimated Creatinine Clearance: 81.2 mL/min (by C-G formula based on SCr of 0.8 mg/dL).    Allergies  Allergen Reactions   Tape Rash   Demerol [Meperidine Hcl] Nausea And Vomiting   Sulfonamide Derivatives Itching   Bacitracin-Polymyxin B Dermatitis    Not listed on the [facility]   Oxycontin  [Oxycodone ] Itching    Antimicrobials this admission: 11/7 zosyn>> 11/7 vanc>> Hold cefadroxil Dose adjustments this admission:   Microbiology results: 11/7 blood>>ngtd 11/8 resp panel>>neg 11/7 urine>>  Sergio Batch, PharmD, BCIDP, AAHIVP, CPP Infectious Disease Pharmacist 06/05/2024 4:32 PM

## 2024-06-06 ENCOUNTER — Other Ambulatory Visit: Payer: Self-pay

## 2024-06-06 ENCOUNTER — Inpatient Hospital Stay (HOSPITAL_COMMUNITY)

## 2024-06-06 DIAGNOSIS — Z66 Do not resuscitate: Secondary | ICD-10-CM | POA: Diagnosis not present

## 2024-06-06 DIAGNOSIS — K9423 Gastrostomy malfunction: Secondary | ICD-10-CM | POA: Diagnosis present

## 2024-06-06 DIAGNOSIS — E1122 Type 2 diabetes mellitus with diabetic chronic kidney disease: Secondary | ICD-10-CM | POA: Diagnosis present

## 2024-06-06 DIAGNOSIS — R4701 Aphasia: Secondary | ICD-10-CM | POA: Diagnosis not present

## 2024-06-06 DIAGNOSIS — D696 Thrombocytopenia, unspecified: Secondary | ICD-10-CM | POA: Diagnosis present

## 2024-06-06 DIAGNOSIS — E871 Hypo-osmolality and hyponatremia: Secondary | ICD-10-CM | POA: Diagnosis present

## 2024-06-06 DIAGNOSIS — I2699 Other pulmonary embolism without acute cor pulmonale: Secondary | ICD-10-CM | POA: Diagnosis not present

## 2024-06-06 DIAGNOSIS — Z1152 Encounter for screening for COVID-19: Secondary | ICD-10-CM | POA: Diagnosis not present

## 2024-06-06 DIAGNOSIS — I130 Hypertensive heart and chronic kidney disease with heart failure and stage 1 through stage 4 chronic kidney disease, or unspecified chronic kidney disease: Secondary | ICD-10-CM | POA: Diagnosis present

## 2024-06-06 DIAGNOSIS — I959 Hypotension, unspecified: Secondary | ICD-10-CM | POA: Diagnosis not present

## 2024-06-06 DIAGNOSIS — T827XXA Infection and inflammatory reaction due to other cardiac and vascular devices, implants and grafts, initial encounter: Secondary | ICD-10-CM | POA: Diagnosis not present

## 2024-06-06 DIAGNOSIS — L03319 Cellulitis of trunk, unspecified: Secondary | ICD-10-CM | POA: Diagnosis present

## 2024-06-06 DIAGNOSIS — I5032 Chronic diastolic (congestive) heart failure: Secondary | ICD-10-CM | POA: Diagnosis present

## 2024-06-06 DIAGNOSIS — F0390 Unspecified dementia without behavioral disturbance: Secondary | ICD-10-CM | POA: Diagnosis present

## 2024-06-06 DIAGNOSIS — L89150 Pressure ulcer of sacral region, unstageable: Secondary | ICD-10-CM | POA: Diagnosis present

## 2024-06-06 DIAGNOSIS — E872 Acidosis, unspecified: Secondary | ICD-10-CM | POA: Diagnosis present

## 2024-06-06 DIAGNOSIS — A4152 Sepsis due to Pseudomonas: Secondary | ICD-10-CM | POA: Diagnosis present

## 2024-06-06 DIAGNOSIS — Z931 Gastrostomy status: Secondary | ICD-10-CM | POA: Diagnosis not present

## 2024-06-06 DIAGNOSIS — G9341 Metabolic encephalopathy: Secondary | ICD-10-CM | POA: Diagnosis present

## 2024-06-06 DIAGNOSIS — G40909 Epilepsy, unspecified, not intractable, without status epilepticus: Secondary | ICD-10-CM | POA: Diagnosis present

## 2024-06-06 DIAGNOSIS — N189 Chronic kidney disease, unspecified: Secondary | ICD-10-CM | POA: Diagnosis present

## 2024-06-06 DIAGNOSIS — B965 Pseudomonas (aeruginosa) (mallei) (pseudomallei) as the cause of diseases classified elsewhere: Secondary | ICD-10-CM | POA: Diagnosis not present

## 2024-06-06 DIAGNOSIS — K9412 Enterostomy infection: Secondary | ICD-10-CM | POA: Diagnosis present

## 2024-06-06 DIAGNOSIS — R6520 Severe sepsis without septic shock: Secondary | ICD-10-CM | POA: Diagnosis present

## 2024-06-06 DIAGNOSIS — R7881 Bacteremia: Secondary | ICD-10-CM | POA: Diagnosis not present

## 2024-06-06 DIAGNOSIS — Z515 Encounter for palliative care: Secondary | ICD-10-CM | POA: Diagnosis not present

## 2024-06-06 DIAGNOSIS — E66811 Obesity, class 1: Secondary | ICD-10-CM | POA: Diagnosis present

## 2024-06-06 DIAGNOSIS — Z1623 Resistance to quinolones and fluoroquinolones: Secondary | ICD-10-CM | POA: Diagnosis present

## 2024-06-06 DIAGNOSIS — R410 Disorientation, unspecified: Secondary | ICD-10-CM | POA: Diagnosis present

## 2024-06-06 DIAGNOSIS — E870 Hyperosmolality and hypernatremia: Secondary | ICD-10-CM | POA: Diagnosis present

## 2024-06-06 DIAGNOSIS — A419 Sepsis, unspecified organism: Secondary | ICD-10-CM | POA: Diagnosis not present

## 2024-06-06 DIAGNOSIS — I69351 Hemiplegia and hemiparesis following cerebral infarction affecting right dominant side: Secondary | ICD-10-CM | POA: Diagnosis not present

## 2024-06-06 LAB — CBC WITH DIFFERENTIAL/PLATELET
Abs Immature Granulocytes: 0.04 K/uL (ref 0.00–0.07)
Basophils Absolute: 0 K/uL (ref 0.0–0.1)
Basophils Relative: 0 %
Eosinophils Absolute: 0.1 K/uL (ref 0.0–0.5)
Eosinophils Relative: 1 %
HCT: 32.5 % — ABNORMAL LOW (ref 36.0–46.0)
Hemoglobin: 10.3 g/dL — ABNORMAL LOW (ref 12.0–15.0)
Immature Granulocytes: 0 %
Lymphocytes Relative: 32 %
Lymphs Abs: 3.3 K/uL (ref 0.7–4.0)
MCH: 34.6 pg — ABNORMAL HIGH (ref 26.0–34.0)
MCHC: 31.7 g/dL (ref 30.0–36.0)
MCV: 109.1 fL — ABNORMAL HIGH (ref 80.0–100.0)
Monocytes Absolute: 2.5 K/uL — ABNORMAL HIGH (ref 0.1–1.0)
Monocytes Relative: 23 %
Neutro Abs: 4.5 K/uL (ref 1.7–7.7)
Neutrophils Relative %: 44 %
Platelets: 156 K/uL (ref 150–400)
RBC: 2.98 MIL/uL — ABNORMAL LOW (ref 3.87–5.11)
RDW: 16.9 % — ABNORMAL HIGH (ref 11.5–15.5)
WBC: 10.5 K/uL (ref 4.0–10.5)
nRBC: 0 % (ref 0.0–0.2)

## 2024-06-06 LAB — BLOOD CULTURE ID PANEL (REFLEXED) - BCID2

## 2024-06-06 LAB — BASIC METABOLIC PANEL WITH GFR
Anion gap: 9 (ref 5–15)
BUN: 15 mg/dL (ref 8–23)
CO2: 23 mmol/L (ref 22–32)
Calcium: 7.7 mg/dL — ABNORMAL LOW (ref 8.9–10.3)
Chloride: 99 mmol/L (ref 98–111)
Creatinine, Ser: 0.71 mg/dL (ref 0.44–1.00)
GFR, Estimated: >60 mL/min (ref >60)
Glucose, Bld: 76 mg/dL (ref 70–99)
Potassium: 4.1 mmol/L (ref 3.5–5.1)
Sodium: 131 mmol/L — ABNORMAL LOW (ref 135–145)

## 2024-06-06 LAB — GLUCOSE, CAPILLARY
Glucose-Capillary: 100 mg/dL — ABNORMAL HIGH (ref 70–99)
Glucose-Capillary: 117 mg/dL — ABNORMAL HIGH (ref 70–99)
Glucose-Capillary: 127 mg/dL — ABNORMAL HIGH (ref 70–99)
Glucose-Capillary: 64 mg/dL — ABNORMAL LOW (ref 70–99)
Glucose-Capillary: 77 mg/dL (ref 70–99)
Glucose-Capillary: 83 mg/dL (ref 70–99)
Glucose-Capillary: 88 mg/dL (ref 70–99)
Glucose-Capillary: 96 mg/dL (ref 70–99)

## 2024-06-06 LAB — PHOSPHORUS: Phosphorus: 3.7 mg/dL (ref 2.5–4.6)

## 2024-06-06 LAB — MAGNESIUM: Magnesium: 1.7 mg/dL (ref 1.7–2.4)

## 2024-06-06 MED ORDER — ENOXAPARIN SODIUM 40 MG/0.4ML IJ SOSY
40.0000 mg | PREFILLED_SYRINGE | INTRAMUSCULAR | Status: DC
  Administered 2024-06-06 – 2024-06-14 (×9): 40 mg via SUBCUTANEOUS
  Filled 2024-06-06 (×9): qty 0.4

## 2024-06-06 MED ORDER — FREE WATER: 200.0000 mL | Status: DC

## 2024-06-06 MED ORDER — LEVETIRACETAM 100 MG/ML PO SOLN
1000.0000 mg | Freq: Two times a day (BID) | ORAL | Status: DC
  Administered 2024-06-06 – 2024-06-13 (×16): 1000 mg
  Filled 2024-06-06 (×18): qty 10

## 2024-06-06 MED ORDER — FAMOTIDINE IN NACL 20-0.9 MG/50ML-% IV SOLN
20.0000 mg | Freq: Two times a day (BID) | INTRAVENOUS | Status: DC
  Filled 2024-06-06 (×2): qty 50

## 2024-06-06 MED ORDER — SIMVASTATIN 20 MG PO TABS: 10.0000 mg | ORAL_TABLET | Freq: Every day | ORAL | Status: DC

## 2024-06-06 MED ORDER — VALPROIC ACID 250 MG/5ML PO SOLN
500.0000 mg | Freq: Three times a day (TID) | ORAL | Status: DC
  Administered 2024-06-06 – 2024-06-13 (×20): 500 mg
  Filled 2024-06-06 (×27): qty 10

## 2024-06-06 MED ORDER — LEVETIRACETAM (KEPPRA) 500 MG/5 ML ADULT IV PUSH
1000.0000 mg | Freq: Two times a day (BID) | INTRAVENOUS | Status: DC
  Filled 2024-06-06 (×2): qty 10

## 2024-06-06 MED ORDER — VALPROIC ACID 250 MG/5ML PO SOLN
750.0000 mg | Freq: Every day | ORAL | Status: DC
  Administered 2024-06-06 – 2024-06-13 (×8): 750 mg
  Filled 2024-06-06 (×8): qty 15

## 2024-06-06 MED ORDER — SODIUM CHLORIDE 0.9 % IV BOLUS
1000.0000 mL | Freq: Once | INTRAVENOUS | Status: AC
  Administered 2024-06-06: 1000 mL via INTRAVENOUS

## 2024-06-06 MED ORDER — SODIUM CHLORIDE 0.9 % IV BOLUS: 500.0000 mL | Freq: Once | INTRAVENOUS | Status: DC

## 2024-06-06 MED ORDER — DEXTROSE 5 % IV SOLN: INTRAVENOUS | Status: DC

## 2024-06-06 MED ORDER — FAMOTIDINE 40 MG/5ML PO SUSR
20.0000 mg | Freq: Two times a day (BID) | ORAL | Status: DC
  Administered 2024-06-06 – 2024-06-10 (×9): 20 mg
  Filled 2024-06-06 (×11): qty 2.5

## 2024-06-06 MED ORDER — ESTRADIOL 0.01 % VA CREA
1.0000 | TOPICAL_CREAM | VAGINAL | Status: DC
  Administered 2024-06-08 – 2024-06-15 (×3): 1 via VAGINAL
  Filled 2024-06-06 (×2): qty 42.5

## 2024-06-06 NOTE — Progress Notes (Signed)
 PHARMACY - PHYSICIAN COMMUNICATION CRITICAL VALUE ALERT - BLOOD CULTURE IDENTIFICATION (BCID)  Kathleen Jensen is an 73 y.o. female who presented to [facility] on 06/04/2024 with a chief complaint of AMS.  Assessment:  Started on ABX for sepsis of unclear source; of note she has a complicated ID hx including multiple Pseudomonas UTIs as well as Kleb pneumo, Providencia, and E coli.  Blood cx this admission growing Pseudomonas aeruginosa in 1 of 3 bottles.  Name of physician contacted: [facility] MD  Current antibiotics: vancomycin  and Zosyn  Changes to prescribed antibiotics recommended:  Recommendations accepted by provider -- covered with Zosyn, could d/c vanc but will leave decision to day team.  Results for orders placed or performed during the hospital encounter of 06/04/24  Blood Culture ID Panel (Reflexed) (Collected: 06/04/2024  5:35 PM)  Result Value Ref Range   Enterococcus faecalis NOT DETECTED NOT DETECTED   Enterococcus Faecium NOT DETECTED NOT DETECTED   Listeria monocytogenes NOT DETECTED NOT DETECTED   Staphylococcus species NOT DETECTED NOT DETECTED   Staphylococcus aureus (BCID) NOT DETECTED NOT DETECTED   Staphylococcus epidermidis NOT DETECTED NOT DETECTED   Staphylococcus lugdunensis NOT DETECTED NOT DETECTED   Streptococcus species NOT DETECTED NOT DETECTED   Streptococcus agalactiae NOT DETECTED NOT DETECTED   Streptococcus pneumoniae NOT DETECTED NOT DETECTED   Streptococcus pyogenes NOT DETECTED NOT DETECTED   A.calcoaceticus-baumannii NOT DETECTED NOT DETECTED   Bacteroides fragilis NOT DETECTED NOT DETECTED   Enterobacterales NOT DETECTED NOT DETECTED   Enterobacter cloacae complex NOT DETECTED NOT DETECTED   Escherichia coli NOT DETECTED NOT DETECTED   Klebsiella aerogenes NOT DETECTED NOT DETECTED   Klebsiella oxytoca NOT DETECTED NOT DETECTED   Klebsiella pneumoniae NOT DETECTED NOT DETECTED   Proteus species NOT DETECTED NOT DETECTED   Salmonella  species NOT DETECTED NOT DETECTED   Serratia marcescens NOT DETECTED NOT DETECTED   Haemophilus influenzae NOT DETECTED NOT DETECTED   Neisseria meningitidis NOT DETECTED NOT DETECTED   Pseudomonas aeruginosa DETECTED (A) NOT DETECTED   Stenotrophomonas maltophilia NOT DETECTED NOT DETECTED   Candida albicans NOT DETECTED NOT DETECTED   Candida auris NOT DETECTED NOT DETECTED   Candida glabrata NOT DETECTED NOT DETECTED   Candida krusei NOT DETECTED NOT DETECTED   Candida parapsilosis NOT DETECTED NOT DETECTED   Candida tropicalis NOT DETECTED NOT DETECTED   Cryptococcus neoformans/gattii NOT DETECTED NOT DETECTED   CTX-M ESBL NOT DETECTED NOT DETECTED   Carbapenem resistance IMP NOT DETECTED NOT DETECTED   Carbapenem resistance KPC NOT DETECTED NOT DETECTED   Carbapenem resistance NDM NOT DETECTED NOT DETECTED   Carbapenem resistance VIM NOT DETECTED NOT DETECTED    Marvetta Dauphin, PharmD, BCPS  06/06/2024  6:26 AM

## 2024-06-06 NOTE — Progress Notes (Signed)
 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 Augustin Molt at [facility]  She is PEG tube dependent for feeds, she has chronic indwelling catheter Sick sinus syndrome with pacemaker Previous admission for staph epidermis bacteremia thought to be a contaminant in 2023--- Previous admission 05/2023 for ileus Stage IV chronic sacral ulcer   Chronology  8/8-->04/07/19/2025 admission for bacteremia in the setting of MRSE-not felt to be a surgical explant candidate and was recommended lifelong cefadroxil PICC line was placed at the time Readmitted 10/22-11/3 she was sent to emergency room for feeding tube leakage CT showed jejunostomy in place adynamic ileus and treated for a UTI secondary to Pseudomonas palliative care was consulted and palliative/hospice was recommended as an outpatient  11/7 [place] evaluation increasing altered mental status GCS 10 significant change to admit her met sepsis criteria on admission Tmax 100.5 given Zosyn vancomycin  cefadroxil held Blood culture now growing Pseudomonas 1/3 bottles   Pertinent imaging/studies till date  11/7 CT head no acute abnormality chronic left frontoparietal infarct with ex vacuo dilatation left lateral ventricle chronic right thalamic infarct-moderate to severe chronic microvascular ischemic change  CXR no acute cardiopulmonary process low lung volume elevated right hemidiaphragm   Assessment  & Plan :    Pseudomonas sepsis-await final etiology on urine 1/3 bottles growing Pseudomonas likely can narrow to Zosyn monotherapy if no  further culture findings After completion of Zosyn, would likely de-escalate back to oral cefadroxil for suppression Not a candidate for explant of the PPM given high risk --- see below discussion Sick sinus syndrome not a candidate for explantation Resume cefadroxil after 5 to 7 days of Zosyn Is maintained on monitors for now and remains full code Concern for endocarditis on TEE 04/01/2024 not a candidate for explant Continues amiodarone  200 daily orally for now, aspirin  81 held for now Metoprolol  25 twice daily held in the setting of sepsis and hypotension--- resume in the next 24 to 48 hours HFpEF EF 60-65%-holding Lasix /torsemide -unclear which she was taking at facility Resume Lasix  20 once we can get feeds sorted out She is developing swelling however in her both her arms and legs and has poor IV access--- will need to resume cautious diuretics soon Not a candidate for beta-blocker at this stage given slight hypotension Chronic stage IV sacral ulcer CVA 2018 right-sided deficit chronic indwelling PEG tube/Foley catheter Continues Keppra  1000 twice daily, Depakote  held apparently on admission PEG tube care as below She is chronically debilitated Malfunctioning PEG tube IR consulted for PEG tube exchange as the hub is not approximating to the abdominal wall and it seems to be feeds coming out onto the abdomen causing some excoriation-last time patient was in the hospital tape was used to approximate the area This may just need to be changed however this time around Keep n.p.o. for now Start D5 short period of time until we can get the tube replaced or look at  Prior to arrival sacral decubitus seen  Looks slightly worse than prior images as below Wound care to see  Encounter for palliative car Complicated situation she has  a legal guardian but her daughter [name] seems to be also involved see separate palliative charting 10/28 She is full code until we can further these  conversations  She will need a PICC line if we are going to continue antibiotics  Data Reviewed today:  Sodium 131 potassium 4.1 BUN/creatinine 15/0.7 WBC 12.5-->11.5 Hemoglobin 10.3 platelet 156 INR 1.3   DVT prophylaxis: Lovenox   Status is: Observation Inpatient pending complicated disposition planning as above    Dispo/Global plan: Unclear   Time 60   Subjective:   Moans and groans in response to my questioning her she seems to understand nods yes On the ground that she is wearing there is significant either gastric succus or PEG feeds that we have stopped-I am able to pull out the PEG tube and it is not well-approximated to the hub We have stopped the majority of oral meds except for absolute essentials    Objective + exam Vitals:   06/06/24 0100 06/06/24 0200 06/06/24 0300 06/06/24 0500  BP:   (!) 107/57   Pulse:      Resp: 20 19 20    Temp:   99 F (37.2 C)   TempSrc:      SpO2:      Weight:    107 kg  Height:       Filed Weights   06/05/24 1525 06/06/24 0000 06/06/24 0500  Weight: 102.4 kg 107 kg 107 kg     Examination: Chronically debilitated black female significant spasm in right upper extremities with swelling on both sides She is not toxic appearing S1-S2 no murmur seems to be in sinus Abdomen distended obese with PEG tube as above picture Some lower extremity edema Cannot get review of systems     Scheduled Meds:  amiodarone   200 mg Per Tube Daily   ascorbic acid   250 mg Per Tube BID   aspirin   81 mg Per Tube QHS   Chlorhexidine  Gluconate Cloth  6 each Topical Daily   collagenase   Topical Daily   docusate  100 mg Per Tube BID   famotidine   40 mg Per Tube Daily   feeding supplement (PROSource TF20)  60 mL Per Tube Daily   folic acid   1,000 mcg Per Tube Daily   Gerhardt's butt cream   Topical TID   levETIRAcetam   1,000 mg Per Tube BID   polyethylene glycol  34 g Per Tube Daily   simethicone   80 mg Per Tube TID   thiamine   100 mg Per  Tube Daily   Continuous Infusions:  feeding supplement (OSMOLITE 1.2 CAL) 60 mL/hr at 06/06/24 0633   piperacillin-tazobactam (ZOSYN)  IV Stopped (06/06/24 0524)   valproate sodium  55 mL/hr at 06/06/24 0633   valproate sodium  Stopped (06/06/24 0019)   vancomycin  Stopped (06/06/24 0326)   acetaminophen  **OR** acetaminophen , ondansetron  **OR** ondansetron  (ZOFRAN ) IV, sorbitol  Jai-Gurmukh Elonda Giuliano, MD  [facility]

## 2024-06-06 NOTE — Progress Notes (Signed)
 Secure chat with Dr Royal to refer PICC placement to IR due to RUE immobility and Left side infected pacer.  IV Team will attempt to place PIV.

## 2024-06-06 NOTE — Progress Notes (Signed)
  IR BRIEF PROGRESS NOTE:  IR was consulted as patient's G-tube has been leaking, and tube feeds are currently off. Per RN report the external bumper guard is not cinched down to skin, as also seen in images provided in chart. Advised RN and care team to not place more than 1 layer of IV gauze at skin level, and to cinch down the bumper guard snuggly to skin. This prevents the retention balloon inside the stomach from moving away from the gastrostomy tract, tamponading and preventing leaking. Advised RN to effect these recommendations, and Care Team may restart tube feeds. If the leaking concern persists despite these adjustments, IR will consider upsizing the G-tube as soon as schedule allows, beginning on 11/10 am. Care team aware.    Electronically Signed: Carlin DELENA Griffon, PA-C 06/06/2024, 1:13 PM

## 2024-06-06 NOTE — Progress Notes (Addendum)
 Assessed LUE for PIV/midline placement with ultrasound.  Unable to find any suitable veins for either.  One vein noted in mid forearm of adequate size but tracks to noncompressible and white echogenicity present.  Secure chat sent to Dr Royal and RN

## 2024-06-06 NOTE — Progress Notes (Signed)
 Arrived at bedside because nursing tells me she had an axillary temp of 102 Blood pressures are low with MAP 57 range She has no IV access I am not able to reach DSS worker at either [phone] or the main legal guardian [name] Search [phone] --- see notes from palliative care which clearly indicate full CODE STATUS as DSS was deliberating the same in the outpatient setting  Appreciative of critical care Dr. Claudene placing a central access for IV fluid This patient's prognosis regardless of our interventions is quite poor-would attempt to manage symptomatically at current level of care and try not to escalate to ICU but as she is a full code we will have to respect that  I am hopeful I can reach DSS tomorrow to clarify the critical nature of her illness and to impress upon them a case for futility with conventional aggressive management in her case

## 2024-06-06 NOTE — Procedures (Addendum)
 Attempted [facility], previous pacemaker has essentially obliterated brachiocephalic vein and was unable to dilate past.  Therefore switched to right side.  Central Venous Catheter Insertion Procedure Note  Laconda Basich  [id number]  February 24, 1951  Date:06/06/24  Time:5:48 PM   Provider Performing:Hersh Minney JAYSON Sharps   Procedure: Insertion of Non-tunneled Central Venous [phone]) with US  guidance (23062)   Indication(s) Difficult access  Consent Unable to obtain consent due to inability to find a medical decision maker for patient.  All reasonable efforts were made.  Another independent medical provider, Dr. Royal , confirmed the benefits of this procedure outweigh the risks.  Anesthesia Topical only with 1% lidocaine    Timeout Verified patient identification, verified procedure, site/side was marked, verified correct patient position, special equipment/implants available, medications/allergies/relevant history reviewed, required imaging and test results available.  Sterile Technique Maximal sterile technique including full sterile barrier drape, hand hygiene, sterile gown, sterile gloves, mask, hair covering, sterile ultrasound probe cover (if used).  Procedure Description Area of catheter insertion was cleaned with chlorhexidine  and draped in sterile fashion.  With real-time ultrasound guidance a central venous catheter was placed into the right internal jugular vein. Nonpulsatile blood flow and easy flushing noted in all ports.  The catheter was sutured in place and sterile dressing applied.  Complications/Tolerance None; patient tolerated the procedure well. Chest X-ray is ordered to verify placement for internal jugular or subclavian cannulation.   Chest x-ray is not ordered for femoral cannulation.  EBL Minimal  Specimen(s) None

## 2024-06-06 NOTE — Progress Notes (Signed)
 06/06/2024 Dr. Samtani and my team have made all reasonable efforts to reach DSS for consent for central line.  No response.  Given the medical necessity will proceed with procedure with 2 physician consent.  Rolan Sharps MD PCCM

## 2024-06-06 NOTE — Progress Notes (Signed)
   06/06/24 1600  Assess: MEWS Score  Temp (!) 102 F (38.9 C)  BP (!) 93/52  MAP (mmHg) (!) 57  Pulse Rate (!) 113  ECG Heart Rate (!) 113  Resp (!) 24  SpO2 97 %  Assess: MEWS Score  MEWS Temp 2  MEWS Systolic 1  MEWS Pulse 2  MEWS RR 1  MEWS LOC 0  MEWS Score 6  MEWS Score Color Red  Assess: if the MEWS score is Yellow or Red  Were vital signs accurate and taken at a resting state? Yes  Does the patient meet 2 or more of the SIRS criteria? Yes  Does the patient have a confirmed or suspected source of infection? Yes  MEWS guidelines implemented  Yes, red  Treat  MEWS Interventions Considered administering scheduled or prn medications/treatments as ordered  Take Vital Signs  Increase Vital Sign Frequency  Red: Q1hr x2, continue Q4hrs until patient remains green for 12hrs  Escalate  MEWS: Escalate Red: Discuss with charge nurse and notify provider. Consider notifying RRT. If remains red for 2 hours consider need for higher level of care  Notify: Charge Nurse/RN  Name of Charge Nurse/RN Notified Gerlandine  Provider Notification  Provider Name/Title Samtani  Date Provider Notified 06/06/24  Time Provider Notified 1600  Method of Notification Page  Notification Reason Change in status  Provider response At bedside  Date of Provider Response 06/06/24  Time of Provider Response 1602  Notify: Rapid Response  Name of Rapid Response RN Notified Nikki  Date Rapid Response Notified 06/06/24  Time Rapid Response Notified 1620  Assess: SIRS CRITERIA  SIRS Temperature  1  SIRS Respirations  1  SIRS Pulse 1  SIRS WBC 0  SIRS Score Sum  3   Patient red MEWs lacks IV, critical care doctor on route to attempt to place so IV antibiotics can be restarted

## 2024-06-06 NOTE — Plan of Care (Signed)

## 2024-06-07 DIAGNOSIS — Z515 Encounter for palliative care: Secondary | ICD-10-CM

## 2024-06-07 DIAGNOSIS — R6520 Severe sepsis without septic shock: Secondary | ICD-10-CM | POA: Diagnosis not present

## 2024-06-07 DIAGNOSIS — A419 Sepsis, unspecified organism: Secondary | ICD-10-CM | POA: Diagnosis not present

## 2024-06-07 DIAGNOSIS — R410 Disorientation, unspecified: Secondary | ICD-10-CM

## 2024-06-07 LAB — GLUCOSE, CAPILLARY
Glucose-Capillary: 63 mg/dL — ABNORMAL LOW (ref 70–99)
Glucose-Capillary: 75 mg/dL (ref 70–99)
Glucose-Capillary: 64 mg/dL — ABNORMAL LOW (ref 70–99)
Glucose-Capillary: 62 mg/dL — ABNORMAL LOW (ref 70–99)
Glucose-Capillary: 107 mg/dL — ABNORMAL HIGH (ref 70–99)
Glucose-Capillary: 104 mg/dL — ABNORMAL HIGH (ref 70–99)
Glucose-Capillary: 64 mg/dL — ABNORMAL LOW (ref 70–99)
Glucose-Capillary: 97 mg/dL (ref 70–99)

## 2024-06-07 LAB — BASIC METABOLIC PANEL WITH GFR
Anion gap: 5 (ref 5–15)
BUN: 10 mg/dL (ref 8–23)
CO2: 24 mmol/L (ref 22–32)
Calcium: 7.4 mg/dL — ABNORMAL LOW (ref 8.9–10.3)
Chloride: 101 mmol/L (ref 98–111)
Creatinine, Ser: 0.63 mg/dL (ref 0.44–1.00)
GFR, Estimated: >60 mL/min (ref >60)
Glucose, Bld: 84 mg/dL (ref 70–99)
Potassium: 3.6 mmol/L (ref 3.5–5.1)
Sodium: 130 mmol/L — ABNORMAL LOW (ref 135–145)

## 2024-06-07 LAB — CBC WITH DIFFERENTIAL/PLATELET
Abs Immature Granulocytes: 0.03 K/uL (ref 0.00–0.07)
Basophils Absolute: 0 K/uL (ref 0.0–0.1)
Basophils Relative: 0 %
Eosinophils Absolute: 0 K/uL (ref 0.0–0.5)
Eosinophils Relative: 0 %
HCT: 28.3 % — ABNORMAL LOW (ref 36.0–46.0)
Hemoglobin: 9.2 g/dL — ABNORMAL LOW (ref 12.0–15.0)
Immature Granulocytes: 0 %
Lymphocytes Relative: 28 %
Lymphs Abs: 2.5 K/uL (ref 0.7–4.0)
MCH: 34.5 pg — ABNORMAL HIGH (ref 26.0–34.0)
MCHC: 32.5 g/dL (ref 30.0–36.0)
MCV: 106 fL — ABNORMAL HIGH (ref 80.0–100.0)
Monocytes Absolute: 1.4 K/uL — ABNORMAL HIGH (ref 0.1–1.0)
Monocytes Relative: 15 %
Neutro Abs: 5.1 K/uL (ref 1.7–7.7)
Neutrophils Relative %: 57 %
Platelets: 129 K/uL — ABNORMAL LOW (ref 150–400)
RBC: 2.67 MIL/uL — ABNORMAL LOW (ref 3.87–5.11)
RDW: 16.8 % — ABNORMAL HIGH (ref 11.5–15.5)
WBC: 9.1 K/uL (ref 4.0–10.5)
nRBC: 0 % (ref 0.0–0.2)

## 2024-06-07 LAB — PHOSPHORUS: Phosphorus: 2.5 mg/dL (ref 2.5–4.6)

## 2024-06-07 LAB — MAGNESIUM: Magnesium: 1.8 mg/dL (ref 1.7–2.4)

## 2024-06-07 MED ORDER — DEXTROSE 50 % IV SOLN
1.0000 | Freq: Once | INTRAVENOUS | Status: AC
  Administered 2024-06-07: 50 mL via INTRAVENOUS
  Filled 2024-06-07: qty 50

## 2024-06-07 MED ORDER — PROSOURCE TF20 ENFIT COMPATIBL EN LIQD
60.0000 mL | Freq: Every day | ENTERAL | Status: DC
  Administered 2024-06-07 – 2024-06-15 (×8): 60 mL
  Filled 2024-06-07 (×9): qty 60

## 2024-06-07 MED ORDER — OSMOLITE 1.2 CAL PO LIQD
1000.0000 mL | ORAL | Status: DC
  Administered 2024-06-07 – 2024-06-15 (×6): 1000 mL
  Filled 2024-06-07 (×14): qty 1000

## 2024-06-07 MED ORDER — DEXTROSE 50 % IV SOLN
12.5000 g | INTRAVENOUS | Status: AC
  Administered 2024-06-07: 12.5 g via INTRAVENOUS

## 2024-06-07 MED ORDER — THIAMINE MONONITRATE 100 MG PO TABS
100.0000 mg | ORAL_TABLET | Freq: Every day | ORAL | Status: AC
  Administered 2024-06-07 – 2024-06-11 (×5): 100 mg
  Filled 2024-06-07 (×5): qty 1

## 2024-06-07 MED ORDER — DEXTROSE 50 % IV SOLN
INTRAVENOUS | Status: AC
  Filled 2024-06-07: qty 50

## 2024-06-07 MED ORDER — FUROSEMIDE 20 MG PO TABS
20.0000 mg | ORAL_TABLET | Freq: Every day | ORAL | Status: DC
  Administered 2024-06-07 – 2024-06-13 (×7): 20 mg via ORAL
  Filled 2024-06-07 (×8): qty 1

## 2024-06-07 MED ORDER — DEXTROSE 50 % IV SOLN
12.5000 g | INTRAVENOUS | Status: AC
  Administered 2024-06-07: 12.5 g via INTRAVENOUS
  Filled 2024-06-07: qty 50

## 2024-06-07 MED ORDER — JUVEN PO PACK
1.0000 | PACK | Freq: Two times a day (BID) | ORAL | Status: DC
  Administered 2024-06-08 – 2024-06-15 (×15): 1
  Filled 2024-06-07 (×15): qty 1

## 2024-06-07 NOTE — TOC Initial Note (Addendum)
 Transition of Care [facility]) - Initial/Assessment Note    Patient Details  Name: Kathleen Jensen MRN: [id number] Date of Birth: 1950/10/15  [facility] CM/SW Contact:    Luise JAYSON Pan, LCSWA Phone Number: 06/07/2024, 8:49 AM  Clinical Narrative:    Patient from [facility] for long term care. Patient previously admitted 10/24 and [place] with [facility] confirmed patients a residency. Patient has a [employer] Guardian on file, [name] Search, DSS, contact number : [phone].   8:52 AM CSW called [name] Search to inform of CSW following patient but mailbox is full and CSW unable to leave a voicemail. CSW reached out to [place] as well.   9:04 AM CSW called DSS supervisor over guardianship, Randine Kipper, at [phone]. CSW left voicemail. CSW provided Tracy's contact to Palliative.   12:28 PM Bedside RN informed CSW that patients daughter is at bedside. Bedside RN inquired with CSW if releasing patient information to daughter is okay. CSW informed bedside RN that since patient has a legal guardian, hospital staff can only inform legal guardian of patient information. CSW informed bedside RN to advise daughter to reach out to legal guardian (DSS) in regard to updates with patient. CSW confirmed this with leadership.   1:58 PM CSW left second voicemail for [place] with DSS. CSW called [name] and received an automated voice message. CSW unable to leave a VM for [place] as the mailbox is full.   CSW will continue to follow.         Expected Discharge Plan: Long Term Nursing Home Barriers to Discharge: Continued Medical Work up   Patient Goals and CMS Choice Patient states their goals for this hospitalization and ongoing recovery are:: Return to [facility].gov Compare Post Acute Care list provided to::  (NA) Choice offered to / list presented to : NA [place] ownership interest in [facility].provided to::  (NA)    Expected Discharge Plan  and Services In-house Referral: Clinical Social Work   Post Acute Care Choice: Skilled Nursing Facility Living arrangements for the past 2 months: Skilled Nursing Facility                                      Prior Living Arrangements/Services Living arrangements for the past 2 months: Skilled Nursing Facility Lives with:: Facility Resident Patient language and need for interpreter reviewed:: Yes Do you feel safe going back to the place where you live?: Yes      Need for Family Participation in Patient Care: Yes (Comment) Care giver support system in place?: Yes (comment) Current home services:  (NA) Criminal Activity/Legal Involvement Pertinent to Current Situation/Hospitalization: No - Comment as needed  Activities of Daily Living   ADL Screening (condition at time of admission) Does the patient have difficulty concentrating, remembering, or making decisions?: Yes  Permission Sought/Granted Permission sought to share information with : Guardian, Magazine Features Editor Permission granted to share information with : No (From a facility and ward of the state)  Share Information with NAME: Search [name]  Permission granted to share info w AGENCY: SNF  Permission granted to share info w Relationship: Legal guardian  Permission granted to share info w Contact Information: [phone]  Emotional Assessment Appearance:: Appears stated age Attitude/Demeanor/Rapport: Unable to Assess Affect (typically observed): Unable to Assess Orientation: : Oriented to Self Alcohol / Substance Use: Not Applicable Psych Involvement: No (comment)  Admission diagnosis:  Delirium [R41.0] Sepsis associated hypotension (HCC) [A41.9, I95.9] Sepsis (HCC) [A41.9] Patient Active Problem List   Diagnosis Date Noted   Palliative care encounter 05/21/2024   Goals of care, counseling/discussion 05/21/2024   Counseling and coordination of care 05/21/2024   Need for emotional support  05/21/2024   Bedbound 05/21/2024   Complication of feeding tube (HCC) 05/21/2024   Sepsis (HCC) 05/20/2024   Elevated lactic acid level 05/19/2024   Hypoglycemia 04/01/2024   Infection and inflammatory reaction due to cardiac device, implant, and graft 04/01/2024   Hypocalcemia 03/31/2024   PEG tube malfunction (HCC) 03/31/2024   Bacteremia 03/25/2024   Chronic health problem 03/25/2024   Ileus (HCC) 06/06/2023   Acute cystitis 03/01/2023   Delirium secondary to UTI 03/01/2023   Atrial fibrillation, chronic (HCC) 03/01/2023   History of seizure 03/01/2023   Chronic hyponatremia 03/01/2023   Acute respiratory failure (HCC) 11/16/2022   Abdominal distension 11/16/2022   AMS (altered mental status) 05/17/2022   Altered mental status 05/15/2022   Sacral wound 05/15/2022   Intracranial hemorrhage (HCC)    Pressure injury of skin 02/18/2022   Chronic indwelling Foley catheter 02/18/2022   PAF (paroxysmal atrial fibrillation) (HCC) 02/18/2022   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HCC) 02/18/2022   Septic shock (HCC) 02/15/2022   Constipation    Abdominal pain    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   History of right hemiplegia (HCC) 04/12/2020   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   Hyperlipidemia associated with type 2 diabetes mellitus (HCC) 07/30/2018   History of CVA (cerebrovascular accident) 07/30/2018   Vitamin B 12 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/30/2018   Asthma 03/13/2018   Seizures (HCC) 01/12/2018   CVA (cerebral vascular accident) (HCC) 12/08/2017   Abnormal thyroid  blood test 12/08/2017   Pulmonary embolism and infarction (HCC) 12/08/2017   Chronic anemia 12/08/2017   History of expressive aphasia 10/15/2017   Seizure disorder (HCC) 10/15/2017   Dysphagia 10/15/2017   Hyperlipidemia 09/22/2017   Urinary frequency 09/22/2017   Preoperative clearance 11/19/2016   Airway hyperreactivity 01/26/2016   Disease of thyroid  gland 01/26/2016    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   Other specified postprocedural states 01/26/2016   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   Chronic low back pain 04/10/2015   Degeneration of intervertebral disc of lumbar region 04/10/2015   Spondylolisthesis of lumbar region 04/10/2015   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   History of cardiac pacemaker in situ 01/09/2015   B12 DEFICIENCY 08/15/2008   Vitamin D  deficiency 08/15/2008   Sick sinus syndrome (HCC) 08/15/2008   OBSTRUCTIVE SLEEP APNEA 08/11/2008   INSOMNIA 04/28/2008   Hyperlipidemia LDL goal <70 12/12/2006   RHINITIS, ALLERGIC NEC 12/12/2006   Hypothyroidism 12/11/2006   Essential hypertension 12/11/2006   GERD 12/11/2006   STRESS INCONTINENCE 12/11/2006   PCP:  System, Provider Not In Pharmacy:  No Pharmacies Listed    Social Drivers of Health (SDOH) Social History: SDOH Screenings   Food Insecurity: Patient Unable To Answer (05/20/2024)  Housing: Patient Unable To Answer (05/20/2024)  Transportation Needs: Patient Unable To Answer (03/25/2024)  Utilities: Patient Unable To Answer (03/25/2024)  Depression (PHQ2-9): Low Risk  (05/22/2019)  Social Connections: Unknown (03/25/2024)  Tobacco Use: Medium Risk (05/19/2024)   SDOH Interventions:     Readmission Risk Interventions    05/31/2024   12:39 PM 05/21/2024    1:03 PM 02/18/2022    2:23 PM  Readmission Risk Prevention Plan  Transportation Screening Complete Complete Complete  PCP or Specialist Appt  within 5-7 Days   Complete  Home Care Screening   Complete  Medication Review (RN CM)   Complete  Medication Review (RN Care Manager) Complete Complete   PCP or Specialist appointment within 3-5 days of discharge Complete Complete   HRI or Home Care Consult Complete Complete   SW Recovery Care/Counseling Consult Complete Complete   Palliative Care Screening Complete Complete   Skilled Nursing Facility Not Applicable Not Applicable

## 2024-06-07 NOTE — Progress Notes (Signed)
 Nutrition Follow-up  DOCUMENTATION CODES:   Not applicable  INTERVENTION:   -TF via j-tube:  Osmolite 1.2 @ 60 ml/hr  60 ml Prosource TF20 daily  Provides 1808 kcal, 100 gm protein, 1181 ml free water  daily   -Monitor Mg, K, and Phos and replete as needed secondary to high refeeding risk -100 mg thiamine  daily x 5 days -1 packet Juven BID via tube, each packet provides 95 calories, 2.5 grams of protein (collagen), and 9.8 grams of carbohydrate (3 grams sugar); also contains 7 grams of L-arginine and L-glutamine, 300 mg vitamin C , 15 mg vitamin E, 1.2 mcg vitamin B-12, 9.5 mg zinc , 200 mg calcium , and 1.5 g  Calcium  Beta-hydroxy-Beta-methylbutyrate to support wound healing   NUTRITION DIAGNOSIS:   Inadequate oral intake related to inability to eat as evidenced by NPO status.  Ongoing  GOAL:   Patient will meet greater than or equal to 90% of their needs  Met with TF  MONITOR:   Weight trends, Labs, TF tolerance, Skin  REASON FOR ASSESSMENT:   Consult Enteral/tube feeding initiation and management  ASSESSMENT:   Pt with PMH significant for: CVA w/ hemiplegia and contractures/aphasia, nursing home resident who is bed bound/ J-tube dependent, GERD, pacemaker, seizure disorder, gastric bypass (2008) and advanced dementia. Returns from nursing home d/t decline in mental status.  11/8- abdominal distention noted 11/9- TF d/c by MD  Reviewed I/O's: -425 ml x 24 hours and +406 ml since admission.   Pt unavailable at time of visit. Attempted to speak with pt via call to hospital room phone, however, unable to reach. RD unable to obtain further nutrition-related history or complete nutrition-focused physical exam at this time.    Patient is a long term SNF resident at [facility].   Per CWOCN notes on 06/05/24, patient with unstageable pressure injury to sacrum with black tan necrotic tissue and MASD to buttocks.   Noted that tube feeds were stopped due to concerns of  leaking PEG tube. IR consulted and revealed that hub was not approximating to abdominal wall. External bumper guard was not cinched down. IR stated ability to resume TF after consult; can consider upsizing if problem occurs.  Per discussion with RN, TF have been resumed.   Per MD and palliative care notes, two physician form has been signed for recommendation of code status. Plan to follow-up with DSS for decision making process. MD states conventional aggressive management in her case is futile. Per MD notes, I do believe the patient is at high risk for rehospitalization readmission and exceedingly high risk for worsening in the short-term near future and it is my opinion that further treatment from a curative route may prove futile as it would not change her underlying bacteremia long-term and she would be relegated to suppressive therapy which is not curative and is fraught with complications such as superinfections with resistant organisms like C. difficile etc. etc.   Medications reviewed and include santyl, pepcid , lasix , keppra , depakene , and zosyn.   Labs reviewed: Na: 130, CBGS: 62-117 (inpatient orders for glycemic control are none).    Diet Order:   Diet Order             Diet NPO time specified  Diet effective now                   EDUCATION NEEDS:   No education needs have been identified at this time  Skin:  Skin Assessment: Skin Integrity Issues: Skin Integrity Issues:: Unstageable, Other (  Comment) DTI: - Unstageable: sacrum Other: MASD to buttocks  Last BM:  11/9  Height:   Ht Readings from Last 1 Encounters:  06/05/24 5' 10 (1.778 m)    Weight:   Wt Readings from Last 1 Encounters:  06/07/24 106.7 kg    Ideal Body Weight:  68.2 kg  BMI:  Body mass index is 33.75 kg/m.  Estimated Nutritional Needs:   Kcal:  1700-1900 kcals  Protein:  90-105g  Fluid:  1.7-1.9L/day    Margery ORN, RD, LDN, CDCES Registered Dietitian III Certified Diabetes  Care and Education Specialist If unable to reach this RD, please use RD Inpatient group chat on secure chat between hours of 8am-4 pm daily

## 2024-06-07 NOTE — Progress Notes (Signed)
 [facility]   ROUNDING   NOTE Kathleen Jensen FMW:[id number]  DOB: 06-13-1951  DOA: 06/04/2024  PCP: System, Provider Not In  06/07/2024,10:18 AM  LOS: 1 day    Code Status: Full code     from: [name]   73 year old female currently a ward of the state Previous CVA 05/2017 with large left parietal intraparenchymal hemorrhage and intraventricular extension and resultant severe right-sided weakness receptive aphasia with seizures as well-follows with Dr. Augustin Molt at [facility]  She is PEG tube dependent for feeds, she has chronic indwelling catheter Sick sinus syndrome with pacemaker Previous admission for staph epidermis bacteremia thought to be a contaminant in 2023--- Previous admission 05/2023 for ileus Stage IV chronic sacral ulcer   Chronology  8/8-->04/07/19/2025 admission for bacteremia in the setting of MRSE-not felt to be a surgical explant candidate and was recommended lifelong cefadroxil PICC line was placed at the time Readmitted 10/22-11/3 she was sent to emergency room for feeding tube leakage CT showed jejunostomy in place adynamic ileus and treated for a UTI secondary to Pseudomonas palliative care was consulted and palliative/hospice was recommended as an outpatient  11/7 [place] evaluation increasing altered mental status GCS 10 significant change to admit her met sepsis criteria on admission Tmax 100.5 given Zosyn vancomycin  cefadroxil held Blood culture now growing Pseudomonas 1/3 bottles 11/9 more hypotensive central line placed by CCM right IJ   Pertinent imaging/studies till date  11/7 CT head no acute abnormality chronic left frontoparietal infarct with ex vacuo dilatation left lateral ventricle chronic right thalamic infarct-moderate to severe chronic microvascular ischemic change  CXR no acute cardiopulmonary process low lung volume elevated right hemidiaphragm   Assessment  & Plan :    Pseudomonas sepsis-urine culture negative, 1/3 blood culture growing  Pseudomonas Previous MRSA E bacteremia as above 1/3 bottles growing Pseudomonas --- continues on Zosyn for now--- will discuss with ID probable Levaquin--- urine cultures are negative blood culture grew Pseudomonas Not a candidate for explant of the PPM per cardiology prior notes Sick sinus syndrome not a candidate for explantation Is maintained on monitors  Concern for endocarditis on TEE 04/01/2024 not a candidate for explant Continues amiodarone  200 daily orally-- aspirin  81 held  Metoprolol  25 bid held on admission for hypotension do not resume HFpEF EF 60-65%-holding Lasix /torsemide -unclear which she was taking at facility Resume Lasix  20 daily as she has a working PEG tube currently Not a candidate for beta-blocker at this stage given slight hypotension Chronic stage IV sacral ulcer CVA 2018 right-sided deficit chronic indwelling PEG tube/Foley catheter Continues Keppra  1000 twice daily, Depakene  500 3 times daily and 750 at bedtime She is chronically debilitated Malfunctioning PEG tube IR consulted and appreciated 11/9-currently working at this time no further workup of PEG tube or upsizing needed Prior to arrival sacral decubitus seen  Looks slightly worse than prior images as below Wound care to see- Encounter for palliative car Complicated situation she has a legal guardian but her daughter [name] seems to be also involved see separate palliative charting 10/28 Input from palliative care appreciated, I have signed a form for two-physician recommendations regarding CODE STATUS changes etc. and DSS worker has been contacted several times to figure out the next steps in this process I do believe the patient is at high risk for rehospitalization readmission and exceedingly high risk for worsening in the short-term near future and it is my opinion that further treatment from a curative route may prove futile as it would not change  her underlying bacteremia long-term and she would be  relegated to suppressive therapy which is not curative and is fraught with complications such as superinfections with resistant organisms like C. difficile etc. etc.  Data Reviewed today:   Sodium 130 potassium 3.6 BUN/creatinine 10/0.6 WBC 9.1 hemoglobin 9.2 platelet 129   DVT prophylaxis: Lovenox   Status is: Observation Inpatient pending complicated disposition planning as above    Dispo/Global plan: Unclear   Time 49   Subjective:   Moans and groans ROS unobtainable She does not follow commands PEG tube seems to be working okay    Objective + exam Vitals:   06/06/24 2315 06/07/24 0320 06/07/24 0455 06/07/24 0900  BP: 107/61 111/65  (!) 111/51  Pulse: 81 82  71  Resp: (!) 25 (!) 23  20  Temp: 98.3 F (36.8 C) (!) 97.3 F (36.3 C)  98.6 F (37 C)  TempSrc: Temporal Temporal  Oral  SpO2: 99% 100%  100%  Weight:   106.7 kg   Height:       Filed Weights   06/06/24 0000 06/06/24 0500 06/07/24 0455  Weight: 107 kg 107 kg 106.7 kg     Examination: Chronically debilitated black female significant spasm in right upper extremities with swelling on both sides She is not toxic appearing S1-S2 no murmur Abdomen distended obese --PEG tube better approximated Grade 1 lower extremity edema Cannot get review of systems     Scheduled Meds:  amiodarone   200 mg Per Tube Daily   Chlorhexidine  Gluconate Cloth  6 each Topical Daily   collagenase   Topical Daily   enoxaparin  (LOVENOX ) injection  40 mg Subcutaneous Q24H   [START ON 06/08/2024] estradiol  1 Applicatorful Vaginal Once per day on Tuesday Friday   famotidine   20 mg Per Tube BID   furosemide   20 mg Oral Daily   Gerhardt's butt cream   Topical TID   levETIRAcetam   1,000 mg Per Tube BID   simethicone   80 mg Per Tube TID   valproic  acid  500 mg Per Tube TID   valproic  acid  750 mg Per Tube QHS   Continuous Infusions:  piperacillin-tazobactam (ZOSYN)  IV 3.375 g (06/07/24 0606)   acetaminophen  **OR**  acetaminophen , [DISCONTINUED] ondansetron  **OR** ondansetron  (ZOFRAN ) IV  Jai-Gurmukh Fauna Neuner, MD  [facility]

## 2024-06-07 NOTE — Consult Note (Signed)
 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  She was recently admitted for Pseudomonas UTI and sepsis as well as J-tube cellulitis.  She did meet criteria for sepsis at time of this admission and had CT of head as well as abdomen on admission.  She has a chronic Foley catheter.  Initial blood pressures were in the 90s but improved after fluids.  She was started on empiric Vanco and Zosyn and admitted to hospital service.  Culture grew Pseudomonas in 1 of 3 bottles.  Chart reviewed and recent notes from hospitalist, critical care, and bedside care team reviewed.  Labs remarkable today forsodium 130, hemoglobin 9.2, platelets of 129.  I personally reviewed recent chest x-ray with IJ noted to be in place.  Questionable density in left base.  I saw and examined Kathleen Jensen today.  She is awake and tracks in room, but she does not speak.  Shakes her head a little, but is not reliable as far as answering any yes or no questions.  Discussed case in detail with primary hospitalist and transition of care team.  I had spoken with legal guardian, [name] Search last hospitalization.  At that time, we had discussed consideration for limitations of care and she noted a need for paperwork to be reviewed by administration at DSS if recommendation was for no attempts at resuscitation in the event of  cardiac arrest.  Today, I discussed in detail with hospitalist regarding appropriateness of aggressive interventions if she reaches a point of natural death.  Consensus among providers is that she would not benefit from heroic interventions and form from [facility] July 2023 titled Physicians Statement Requesting DO NOT RESUSCITATE (DNR)/No Code Ordercompleted outlining recommendation for no attempt at resuscitation in the event of a natural death.  Calls have been placed multiple times to DSS legal guardian as well as DSS supervisor and await return call to continue discussion and obtain contact information to send paperwork for their review.    Primary Diagnoses  Present on Admission:  Sepsis (HCC)  Essential hypertension  History of expressive aphasia  OBSTRUCTIVE SLEEP APNEA  Pulmonary embolism and infarction (HCC)  PAF (paroxysmal atrial fibrillation) (HCC)   Palliative Review of Systems: Unable to obtain  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)    Hyperlipemia    Hypertension    Insomnia    Intracerebral hemorrhage, intraventricular (HCC)    Muscle weakness    Other abnormalities of gait and mobility    Seizures (HCC)    Stroke (HCC)  Unsteadiness on feet    Vitamin B deficiency    Social History   Socioeconomic History   Marital status: Divorced    Spouse name: Not on file   Number of children: 3   Years of education: 26yrs   Highest education level: Not on file  Occupational History   Occupation: Retired    Associate Professor: [employer]    Comment: social  services  Tobacco Use   Smoking status: Former    Current packs/day: 0.00    Types: Cigarettes    Start date: 07/30/1975    Quit date: 07/30/1979    Years since quitting: 44.8   Smokeless tobacco: Never  Vaping Use   Vaping status: Never Used  Substance and Sexual Activity   Alcohol use: No    Alcohol/week: 0.0 standard drinks of alcohol   Drug use: No   Sexual activity: Never    Partners: Male  Other Topics Concern   Not on file  Social History Narrative   Patient lives at home alone.   Caffeine Use: 1 cup daily   1 dog   Social Drivers of Corporate Investment Banker Strain: Not on file  Food Insecurity: Patient Unable To Answer (05/20/2024)   Hunger Vital Sign    Worried About Running Out of Food in the Last Year: Patient unable to answer    Ran Out of Food in the Last Year: Patient unable to answer  Transportation Needs: Patient Unable To Answer (03/25/2024)   PRAPARE - Transportation    Lack of Transportation (Medical): Patient unable to answer    Lack of Transportation (Non-Medical): Patient unable to answer  Physical Activity: Not on file  Stress: Not on file  Social Connections: Unknown (03/25/2024)   Social Connection and Isolation Panel    Frequency of Communication with Friends and Family: Patient unable to answer    Frequency of Social Gatherings with Friends and Family: Patient unable to answer    Attends Religious Services: Patient unable to answer    Active Member of Clubs or Organizations: Not on file    Attends Banker Meetings: Patient unable to answer    Marital Status: Patient unable to answer   Family History  Problem Relation Age of Onset   High blood pressure Mother    Diabetes Mother    Diabetes Father    Stroke Father    Cancer Sister        Colorectal cancer   Cancer Brother        colon   Scheduled Meds:  amiodarone   200 mg Per Tube Daily   Chlorhexidine  Gluconate Cloth  6 each Topical Daily   collagenase   Topical Daily    dextrose   12.5 g Intravenous STAT   enoxaparin  (LOVENOX ) injection  40 mg Subcutaneous Q24H   [START ON 06/08/2024] estradiol  1 Applicatorful Vaginal Once per day on Tuesday Friday   famotidine   20 mg Per Tube BID   furosemide   20 mg Oral Daily   Gerhardt's butt cream   Topical TID   levETIRAcetam   1,000 mg Per Tube BID   simethicone   80 mg Per Tube TID   valproic  acid  500 mg Per Tube TID   valproic  acid  750 mg Per Tube QHS   Continuous Infusions:  piperacillin-tazobactam (ZOSYN)  IV 3.375 g (06/07/24 0606)   PRN Meds:.acetaminophen  **OR** acetaminophen , [DISCONTINUED] ondansetron  **OR** ondansetron  (ZOFRAN ) IV Allergies  Allergen Reactions   Tape Rash   Demerol [Meperidine Hcl]  Nausea And Vomiting   Sulfonamide Derivatives Itching   Bacitracin-Polymyxin B Dermatitis    Not listed on [facility]   Oxycontin  [Oxycodone ] Itching   CBC:    Component Value Date/Time   WBC 9.1 06/07/2024 0325   HGB 9.2 (L) 06/07/2024 0325   HCT 28.3 (L) 06/07/2024 0325   PLT 129 (L) 06/07/2024 0325   MCV 106.0 (H) 06/07/2024 0325   NEUTROABS 5.1 06/07/2024 0325   LYMPHSABS 2.5 06/07/2024 0325   MONOABS 1.4 (H) 06/07/2024 0325   EOSABS 0.0 06/07/2024 0325   BASOSABS 0.0 06/07/2024 0325   Comprehensive Metabolic Panel:    Component Value Date/Time   NA 130 (L) 06/07/2024 0325   K 3.6 06/07/2024 0325   CL 101 06/07/2024 0325   CO2 24 06/07/2024 0325   BUN 10 06/07/2024 0325   CREATININE 0.63 06/07/2024 0325   GLUCOSE 84 06/07/2024 0325   GLUCOSE 122 (H) 07/07/2006 1222   CALCIUM  7.4 (L) 06/07/2024 0325   AST 41 06/05/2024 0500   ALT 15 06/05/2024 0500   ALKPHOS 140 (H) 06/05/2024 0500   BILITOT 0.9 06/05/2024 0500   PROT 8.2 (H) 06/05/2024 0500   ALBUMIN  <1.5 (L) 06/05/2024 0500    Physical Exam: Vital Signs: BP 99/64 (BP Location: Right Arm)   Pulse 74   Temp 97.9 F (36.6 C) (Oral)   Resp 20   Ht 5' 10 (1.778 m)   Wt 106.7 kg   SpO2 96%   BMI 33.75 kg/m  SpO2: SpO2: 96  % O2 Device: O2 Device: Nasal Cannula O2 Flow Rate: O2 Flow Rate (L/min): 1 L/min Intake/output summary:  Intake/Output Summary (Last 24 hours) at 06/07/2024 1330 Last data filed at 06/07/2024 0400 Gross per 24 hour  Intake 60.04 ml  Output 285 ml  Net -224.96 ml   LBM: Last BM Date : 06/06/24 Baseline Weight: Weight: 102.4 kg Most recent weight: Weight: 106.7 kg  General: Chronically ill-appearing black female with contracture right upper extremity and bilateral swelling Cardiovascular: Regular rate and rhythm, no murmur appreciated Lungs: Diminished bilaterally Abdomen: Distended, PEG tube in place Extremities: +1 edema bilaterally in lower extremity Neuro: Tracks in room but unable to converse and does not follow commands.            Palliative Performance Scale: 20             Additional Data Reviewed: Recent Labs    06/06/24 0241 06/07/24 0325  WBC 10.5 9.1  HGB 10.3* 9.2*  PLT 156 129*  NA 131* 130*  BUN 15 10  CREATININE 0.71 0.63    Imaging: DG CHEST PORT 1 VIEW EXAM: 1 VIEW(S) XRAY OF THE CHEST 06/06/2024 05:56:00 PM  COMPARISON: None available.  CLINICAL HISTORY: Encounter for central line placement [id number]  FINDINGS:  LINES, TUBES AND DEVICES: Right internal jugular central venous catheter in place with tip terminating over the superior cavoatrial junction. Left chest cardiac pacing device noted.  LUNGS AND PLEURA: Elevated right hemidiaphragm. Left basilar linear opacities consistent with atelectasis. No focal pulmonary opacity. No pulmonary edema. No pleural effusion. No pneumothorax.  HEART AND MEDIASTINUM: Tortuous thoracic aorta. Atherosclerotic calcifications. No acute abnormality of the cardiac and mediastinal silhouettes.  BONES AND SOFT TISSUES: Right upper quadrant clips noted. No acute osseous abnormality.  IMPRESSION: 1. Right internal jugular central venous catheter in place with tip terminating over the superior  cavoatrial junction. 2. Left basilar linear opacities consistent with atelectasis. 3. Elevated right hemidiaphragm.  Electronically signed by: Norman  Stutzman MD 06/06/2024 06:25 PM EST RP Workstation: [id number]  EKG SITE RITE If Site Rite image not attached, placement could not be confirmed due to  current cardiac rhythm.    I personally reviewed recent imaging.   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  - Attempted to reach patient's DSS guardian, [name], multiple times throughout the day to discuss her care.  Unable to reach and voicemail full so I left left contact information on voicemail of DSS supervisor, Randine Sieving, requesting callback.  - Completed paperwork for mentation for no attempts at resuscitation in the event of natural death.  Await callback from DSS to see how best to submit paperwork for their review.  - During her last hospitalization, DSS noted it may be beneficial to plan for a family meeting.  This was not able to be accomplished before she discharge.  If she is going to be here long enough to arrange, we would be happy to facilitate a meeting between palliative care, DSS, and family to further discuss long-term goals.  -  Code Status: Full Code  Prognosis: Guarded  # Symptom management Patient is receiving these palliative interventions for symptom management with an intent to improve quality of life.   - Pain: Acetaminophen  as needed.  No signs of pain on my exam today.  Last medication given yesterday around 4  PM.  - Nausea: Zofran  if needed.  Has not received since admission.  # Psycho-social/Spiritual Support:  - Support System: Ward of state.  She has children who come to visit her hospital and continue to support her.  It is not clear the level of due to her having legal guardian.  Will need to clarify before further meetings are set up or information is shared. - Desire for further Chaplain support:no  # Discharge Planning:  To Be Determined-she is a long-term care resident of skilled facility  Thank you for allowing the palliative care team to participate in the care Miyo Trudy Collet.  Amaryllis Meissner, MD Palliative Care Provider PMT # [phone]  If patient remains symptomatic despite maximum doses, please call PMT at [phone] between 0700 and 1900. Outside of these hours, please call attending, as PMT does not have night coverage.   I personally spent a total of 85 minutes in the care of the patient today including preparing to see the patient, getting/reviewing separately obtained history, performing a medically appropriate exam/evaluation, referring and communicating with other health care professionals, documenting clinical information in the EHR, coordinating care, and completing forms requested by DSS.

## 2024-06-07 NOTE — Consult Note (Signed)
 WOC Nurse Consult Note: Reason for Consult: Consult requested for buttocks and sacrum.  This was already performed on 11/8; please refer to previous consult note for assessment, and topical treatment orders have been provided for bedside nurses to perform.  Please re-consult if further assistance is needed.  Thank-you, Stephane Fought MSN, RN, CWOCN, CWCN-AP, CNS Contact Mon-Fri 0700-1500: [phone]

## 2024-06-08 DIAGNOSIS — A419 Sepsis, unspecified organism: Secondary | ICD-10-CM | POA: Diagnosis not present

## 2024-06-08 DIAGNOSIS — I2699 Other pulmonary embolism without acute cor pulmonale: Secondary | ICD-10-CM

## 2024-06-08 DIAGNOSIS — R6520 Severe sepsis without septic shock: Secondary | ICD-10-CM | POA: Diagnosis not present

## 2024-06-08 LAB — CBC WITH DIFFERENTIAL/PLATELET
Abs Immature Granulocytes: 0.03 K/uL (ref 0.00–0.07)
Basophils Absolute: 0 K/uL (ref 0.0–0.1)
Basophils Relative: 0 %
Eosinophils Absolute: 0.1 K/uL (ref 0.0–0.5)
Eosinophils Relative: 1 %
HCT: 31.2 % — ABNORMAL LOW (ref 36.0–46.0)
Hemoglobin: 10.1 g/dL — ABNORMAL LOW (ref 12.0–15.0)
Immature Granulocytes: 0 %
Lymphocytes Relative: 27 %
Lymphs Abs: 2.2 K/uL (ref 0.7–4.0)
MCH: 34.8 pg — ABNORMAL HIGH (ref 26.0–34.0)
MCHC: 32.4 g/dL (ref 30.0–36.0)
MCV: 107.6 fL — ABNORMAL HIGH (ref 80.0–100.0)
Monocytes Absolute: 1.2 K/uL — ABNORMAL HIGH (ref 0.1–1.0)
Monocytes Relative: 15 %
Neutro Abs: 4.7 K/uL (ref 1.7–7.7)
Neutrophils Relative %: 57 %
Platelets: 124 K/uL — ABNORMAL LOW (ref 150–400)
RBC: 2.9 MIL/uL — ABNORMAL LOW (ref 3.87–5.11)
RDW: 16.3 % — ABNORMAL HIGH (ref 11.5–15.5)
WBC: 8.3 K/uL (ref 4.0–10.5)
nRBC: 0 % (ref 0.0–0.2)

## 2024-06-08 LAB — GLUCOSE, CAPILLARY
Glucose-Capillary: 109 mg/dL — ABNORMAL HIGH (ref 70–99)
Glucose-Capillary: 150 mg/dL — ABNORMAL HIGH (ref 70–99)
Glucose-Capillary: 151 mg/dL — ABNORMAL HIGH (ref 70–99)
Glucose-Capillary: 156 mg/dL — ABNORMAL HIGH (ref 70–99)
Glucose-Capillary: 157 mg/dL — ABNORMAL HIGH (ref 70–99)
Glucose-Capillary: 81 mg/dL (ref 70–99)

## 2024-06-08 LAB — BASIC METABOLIC PANEL WITH GFR
Anion gap: 5 (ref 5–15)
BUN: 9 mg/dL (ref 8–23)
CO2: 25 mmol/L (ref 22–32)
Calcium: 7.6 mg/dL — ABNORMAL LOW (ref 8.9–10.3)
Chloride: 103 mmol/L (ref 98–111)
Creatinine, Ser: 0.66 mg/dL (ref 0.44–1.00)
GFR, Estimated: >60 mL/min (ref >60)
Glucose, Bld: 151 mg/dL — ABNORMAL HIGH (ref 70–99)
Potassium: 3.2 mmol/L — ABNORMAL LOW (ref 3.5–5.1)
Sodium: 133 mmol/L — ABNORMAL LOW (ref 135–145)

## 2024-06-08 LAB — CULTURE, BLOOD (ROUTINE X 2): Culture  Setup Time: NO GROWTH

## 2024-06-08 LAB — PHOSPHORUS: Phosphorus: 2 mg/dL — ABNORMAL LOW (ref 2.5–4.6)

## 2024-06-08 LAB — MAGNESIUM: Magnesium: 1.9 mg/dL (ref 1.7–2.4)

## 2024-06-08 MED ORDER — POTASSIUM CHLORIDE 20 MEQ PO PACK
20.0000 meq | PACK | Freq: Every day | ORAL | Status: DC
  Administered 2024-06-08 – 2024-06-15 (×7): 20 meq via ORAL
  Filled 2024-06-08 (×8): qty 1

## 2024-06-08 NOTE — Progress Notes (Signed)
 Daily Progress Note   Patient Name: Kathleen Jensen       Date: 06/08/2024 DOB: 09-18-1950  Age: 73 y.o. MRN#: [id number] Attending Physician: Samtani, Jai-Gurmukh, MD Primary Care Physician: System, Provider Not In Admit Date: 06/04/2024 Length of Stay: 2 days  Reason for Consultation/Follow-up: Establishing goals of care  Subjective:   Subjective: EMR reviewed including notes from hospitalist and transition of care team.  No return call noted from DSS.  Labs reviewed this morning reveal sodium 133, potassium 3.2, hemoglobin 10.1, platelets 124.  No new imaging today.  I saw and examined patient.  Nursing student present at bedside.  No family present.  Patient tracks in room and makes a few grunting type noises but is not able to communicate.  She does not follow commands.  Call placed again to DSS for discussion regarding goals and inquiry as to where to send paperwork with recommendation for limit of DNR/DNI in the event of natural death.  No answer and no ability to leave voicemail.  Second voicemail left for supervisor.   Review of Systems Unable to obtain  Objective:   Vital Signs:  BP 108/70 (BP Location: Right Arm)   Pulse 95   Temp 99.4 F (37.4 C) (Oral)   Resp (!) 23   Ht 5' 10 (1.778 m)   Wt 105.7 kg   SpO2 96%   BMI 33.44 kg/m   Physical Exam: General: NAD, alert Eyes: conjunctiva clear, anicteric sclera HENT: normocephalic, atraumatic, moist mucous membranes Cardiovascular: RRR, no edema in LE b/l Respiratory: no increased work of breathing noted, not in respiratory distress Abdomen: not distended Extremities:  Skin: no rashes or lesions on visible skin Neuro: A&Ox4, following commands easily Psych: appropriately answers all questions  Imaging: @IMAGES @  I personally reviewed recent imaging.   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  Unable to reach and voicemail full for primary contact for guardian.  Voicemail was also left for supervisor, Kathleen Jensen.  - Paperwork completed yesterday by myself and Dr. Royal with recommendation for no attempts at resuscitation in the event of natural death.  Awaiting callback from DSS regarding how to submit paperwork for review.  - Consider meeting between palliative care team, DSS, and family based upon anticipated length of stay and desire of DSS to have meeting with family as they are her legal guardian.  -  Code Status: Full Code  Prognosis: Unable to determine  # Symptom management: Patient is receiving these palliative interventions for symptom management with an intent to improve quality of life.   - Pain: Acetaminophen  as needed  - Nausea: Zofran  as needed  # Psychosocial Support:  -- Support System: Ward of state.  She has children who come to visit her hospital and continue to support her.  It is not clear the level of involvement due to her having legal guardian.  Will need to clarify with DSS before further meetings are set up or information is shared. - Desire for further Chaplain support:no  # Discharge Planning: To Be Determined  -  Discussed with: bedside staff  Thank you for allowing the palliative care team to participate in the care Kathleen Jensen.  Kathleen Meissner, MD Palliative Care Provider PMT # [phone]  If patient remains symptomatic despite maximum doses, please call PMT at [phone] between 0700 and 1900. Outside of these hours, please call attending, as PMT does not  have night coverage.   I personally spent a total of 25 minutes in the care of the patient today including preparing to see the patient, getting/reviewing separately obtained history, performing a medically appropriate exam/evaluation, and documenting clinical information in the EHR.

## 2024-06-08 NOTE — Progress Notes (Signed)
 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 Augustin Molt at [facility]  She is PEG tube dependent for feeds, she has chronic indwelling catheter Sick sinus syndrome with pacemaker Previous admission for staph epidermis bacteremia thought to be a contaminant in 2023--- Previous admission 05/2023 for ileus Stage IV chronic sacral ulcer   Chronology  8/8-->04/07/19/2025 admission for bacteremia in the setting of MRSE-not felt to be a surgical explant candidate and was recommended lifelong cefadroxil PICC line was placed at the time Readmitted 10/22-11/3 she was sent to emergency room for feeding tube leakage CT showed jejunostomy in place adynamic ileus and treated for a UTI secondary to Pseudomonas palliative care was consulted and palliative/hospice was recommended as an outpatient  11/7  evaluation increasing altered mental status GCS 10 significant change to admit her met sepsis criteria on admission Tmax 100.5 given Zosyn vancomycin  cefadroxil held Blood culture now growing Pseudomonas 1/3 bottles 11/9 more hypotensive central line placed by CCM right IJ   Pertinent imaging/studies till date  11/7 CT head no acute abnormality chronic left frontoparietal infarct with ex vacuo dilatation left lateral ventricle chronic right thalamic infarct-moderate to severe chronic microvascular ischemic change  CXR no acute cardiopulmonary process low lung volume elevated right hemidiaphragm   Assessment  & Plan :    Pseudomonas sepsis-urine culture negative, 1/3 blood culture growing  Pseudomonas Previous MRSA E bacteremia as above 1/3 bottles growing Pseudomonas --- continues on Zosyn for now- Pseudomonas is resistant to Cipro so asking for formal ID consult Not a candidate for explant of the PPM per cardiology prior notes Sick sinus syndrome not a candidate for explantation Is maintained on monitors as normal sinus rhythm for now Concern for endocarditis on TEE 04/01/2024 not a candidate for explant Continues amiodarone  200 daily orally-- aspirin  81 held  Metoprolol  25 bid held on admission for hypotension  HFpEF EF 60-65%-holding Lasix /torsemide -unclear which she was taking at facility Resume Lasix  20 daily as she has a working PEG tube currently-aim for net even balance watch labs Not a candidate for beta-blocker at this stage given slight hypotension Chronic stage IV sacral ulcer CVA 2018 right-sided deficit chronic indwelling PEG tube/Foley catheter Continues Keppra  1000 twice daily, Depakene  500 3 times daily and 750 at bedtime She is chronically debilitated and has contractures of upper extremities-I am not sure if she will ever be able to be rehabitable Malfunctioning PEG tube IR consulted and appreciated 11/9-currently working at this time no further workup of PEG tube or upsizing needed Continuous feeds at 60 mL/h, Prosource 60 mL via tube Juven 1 packet twice daily Prior to arrival sacral decubitus seen  Looks slightly worse than prior images as below Wound care has evaluated and recommend Vashe do not rinse allow air dry quarter inch thick layer of Santyl to wound bed top with moist saline dry gauze and silicone foam it is an unstageable injury present prior to admission Can use GoodRx cream Mild hypokalemia Replace potassium with K-Lor packets 20, a.m. magnesium  Encounter for palliative care Complicated situation  Input from palliative care appreciated, 11/11 did  sign a form for two-physician recommendations regarding CODE STATUS changes etc. and DSS  worker has been contacted several times to figure out the next steps in this process We are still waiting to hear from DSS  Data Reviewed today:   Sodium 133 potassium 3.2 BUN/creatinine 9/0.6 phosphorus 2.0 WBC 8.3 hemoglobin 10.1 platelet 123   DVT prophylaxis: Lovenox   Status is: Observation Inpatient pending complicated disposition planning as above    Dispo/Global plan: Unclear   Time 50   Subjective:   RoS unobtainable She remains about the same as prior   Objective + exam Vitals:   06/07/24 2305 06/08/24 0315 06/08/24 0721 06/08/24 0745  BP: 106/65 105/77 108/70   Pulse: (!) 102 91 77 95  Resp: (!) 25 20 (!) 26 (!) 23  Temp: 99.5 F (37.5 C) (!) 97.1 F (36.2 C) 99.4 F (37.4 C)   TempSrc: Oral Temporal Oral   SpO2: 97% 98% 99% 96%  Weight:  105.7 kg    Height:       Filed Weights   06/06/24 0500 06/07/24 0455 06/08/24 0315  Weight: 107 kg 106.7 kg 105.7 kg     Examination: Chronically debilitated black female significant spasm in right upper extremities with swelling on both sides She is not toxic appearing S1-S2 no murmur Abdomen distended obese --PEG tube better approximated and infusing feeds Grade 1 lower extremity edema Did not examine wound bed today Cannot get review of systems   Scheduled Meds:  amiodarone   200 mg Per Tube Daily   Chlorhexidine  Gluconate Cloth  6 each Topical Daily   collagenase   Topical Daily   enoxaparin  (LOVENOX ) injection  40 mg Subcutaneous Q24H   estradiol  1 Applicatorful Vaginal Once per day on Tuesday Friday   famotidine   20 mg Per Tube BID   feeding supplement (PROSource TF20)  60 mL Per Tube Daily   furosemide   20 mg Oral Daily   Gerhardt's butt cream   Topical TID   levETIRAcetam   1,000 mg Per Tube BID   nutrition supplement (JUVEN)  1 packet Per Tube BID BM   simethicone   80 mg Per Tube TID   thiamine   100 mg Per Tube Daily   valproic  acid  500 mg Per Tube TID   valproic  acid  750 mg Per Tube QHS    Continuous Infusions:  feeding supplement (OSMOLITE 1.2 CAL) 60 mL/hr at 06/08/24 0630   piperacillin-tazobactam (ZOSYN)  IV 3.375 g (06/08/24 0630)   acetaminophen  **OR** acetaminophen , [DISCONTINUED] ondansetron  **OR** ondansetron  (ZOFRAN ) IV  Jai-Gurmukh Idara Woodside, MD  [facility]

## 2024-06-08 NOTE — TOC Progression Note (Addendum)
 Transition of Care [facility]) - Progression Note    Patient Details  Name: Kathleen Jensen MRN: [id number] Date of Birth: 01-Mar-1951  [facility] of [facility]) CM/SW Contact  Luise JAYSON Pan, [place] Phone Number: 06/08/2024, 12:28 PM  Clinical Narrative:   CSW left VM for Randine (DSS supervisor over guardianship) and attempted to call [name] (DSS guardian).   CSW staffed case with [facility] leadership, Harlene Jes, in regard to patient DSS guardian and DSS supervisor not returning calls. TOC Supervisor advised CSW to call main intake line and ask for on call caseworker to return CSWs call.   12:39 PM CSW left VM for main intake line with APS. CSW notified treatment team.   2:53 PM Myrick called CSW to inform that CSWs contact info has been given to on call supervisor, Harlene, with APS.   3:01 PM Myrick called CSW again to inform that today is a state holiday and advised CSW to call back during operational business hours.   CSW will continue to follow.    Expected Discharge Plan: Long Term Nursing Home Barriers to Discharge: Continued Medical Work up               Expected Discharge Plan and Services In-house Referral: Clinical Social Work   Post Acute Care Choice: Skilled Nursing Facility Living arrangements for the past 2 months: Skilled Nursing Facility                                       Social Drivers of Health (SDOH) Interventions SDOH Screenings   Food Insecurity: Patient Unable To Answer (05/20/2024)  Housing: Patient Unable To Answer (05/20/2024)  Transportation Needs: Patient Unable To Answer (03/25/2024)  Utilities: Patient Unable To Answer (03/25/2024)  Depression (PHQ2-9): Low Risk  (05/22/2019)  Social Connections: Unknown (03/25/2024)  Tobacco Use: Medium Risk (05/19/2024)    Readmission Risk Interventions    05/31/2024   12:39 PM 05/21/2024    1:03 PM 02/18/2022    2:23 PM  Readmission Risk Prevention Plan  Transportation Screening  Complete Complete Complete  PCP or Specialist Appt within 5-7 Days   Complete  Home Care Screening   Complete  Medication Review (RN CM)   Complete  Medication Review (RN Care Manager) Complete Complete   PCP or Specialist appointment within 3-5 days of discharge Complete Complete   HRI or Home Care Consult Complete Complete   SW Recovery Care/Counseling Consult Complete Complete   Palliative Care Screening Complete Complete   Skilled Nursing Facility Not Applicable Not Applicable

## 2024-06-09 DIAGNOSIS — R6520 Severe sepsis without septic shock: Secondary | ICD-10-CM | POA: Diagnosis not present

## 2024-06-09 DIAGNOSIS — B965 Pseudomonas (aeruginosa) (mallei) (pseudomallei) as the cause of diseases classified elsewhere: Secondary | ICD-10-CM

## 2024-06-09 DIAGNOSIS — A419 Sepsis, unspecified organism: Secondary | ICD-10-CM | POA: Diagnosis not present

## 2024-06-09 DIAGNOSIS — R7881 Bacteremia: Secondary | ICD-10-CM

## 2024-06-09 DIAGNOSIS — T827XXA Infection and inflammatory reaction due to other cardiac and vascular devices, implants and grafts, initial encounter: Secondary | ICD-10-CM

## 2024-06-09 LAB — BASIC METABOLIC PANEL WITH GFR
Anion gap: 7 (ref 5–15)
BUN: 23 mg/dL (ref 8–23)
CO2: 27 mmol/L (ref 22–32)
Calcium: 7.4 mg/dL — ABNORMAL LOW (ref 8.9–10.3)
Chloride: 102 mmol/L (ref 98–111)
Creatinine, Ser: 0.6 mg/dL (ref 0.44–1.00)
GFR, Estimated: >60 mL/min (ref >60)
Glucose, Bld: 168 mg/dL — ABNORMAL HIGH (ref 70–99)
Potassium: 4.1 mmol/L (ref 3.5–5.1)
Sodium: 136 mmol/L (ref 135–145)

## 2024-06-09 LAB — CBC WITH DIFFERENTIAL/PLATELET
Abs Immature Granulocytes: 0.03 K/uL (ref 0.00–0.07)
Basophils Absolute: 0 K/uL (ref 0.0–0.1)
Basophils Relative: 0 %
Eosinophils Absolute: 0.1 K/uL (ref 0.0–0.5)
Eosinophils Relative: 1 %
HCT: 28.7 % — ABNORMAL LOW (ref 36.0–46.0)
Hemoglobin: 9.2 g/dL — ABNORMAL LOW (ref 12.0–15.0)
Immature Granulocytes: 0 %
Lymphocytes Relative: 27 %
Lymphs Abs: 2.3 K/uL (ref 0.7–4.0)
MCH: 35.4 pg — ABNORMAL HIGH (ref 26.0–34.0)
MCHC: 32.1 g/dL (ref 30.0–36.0)
MCV: 110.4 fL — ABNORMAL HIGH (ref 80.0–100.0)
Monocytes Absolute: 1.2 K/uL — ABNORMAL HIGH (ref 0.1–1.0)
Monocytes Relative: 14 %
Neutro Abs: 5 K/uL (ref 1.7–7.7)
Neutrophils Relative %: 58 %
Platelets: 106 K/uL — ABNORMAL LOW (ref 150–400)
RBC: 2.6 MIL/uL — ABNORMAL LOW (ref 3.87–5.11)
RDW: 16.6 % — ABNORMAL HIGH (ref 11.5–15.5)
Smear Review: NORMAL
WBC: 8.6 K/uL (ref 4.0–10.5)
nRBC: 0 % (ref 0.0–0.2)

## 2024-06-09 LAB — PHOSPHORUS: Phosphorus: 1.3 mg/dL — ABNORMAL LOW (ref 2.5–4.6)

## 2024-06-09 LAB — CULTURE, BLOOD (ROUTINE X 2): Culture: NO GROWTH

## 2024-06-09 LAB — MAGNESIUM: Magnesium: 2.4 mg/dL (ref 1.7–2.4)

## 2024-06-09 LAB — GLUCOSE, CAPILLARY
Glucose-Capillary: 120 mg/dL — ABNORMAL HIGH (ref 70–99)
Glucose-Capillary: 154 mg/dL — ABNORMAL HIGH (ref 70–99)
Glucose-Capillary: 172 mg/dL — ABNORMAL HIGH (ref 70–99)
Glucose-Capillary: 166 mg/dL — ABNORMAL HIGH (ref 70–99)

## 2024-06-09 NOTE — Progress Notes (Signed)
 Patient transferred to 5W at roughly 12am. Handoff report called. Receiving RN at bedside. No expressed further questions. No further needs at this time. Ivonne Freeburg E Deonta Bomberger, RN

## 2024-06-09 NOTE — TOC Progression Note (Addendum)
 Transition of Care [facility]) - Progression Note    Patient Details  Name: Kathleen Jensen MRN: [id number] Date of Birth: Aug 13, 1950  Transition of [facility]) CM/SW Contact  Luise JAYSON Pan, [place] Phone Number: 06/09/2024, 12:06 PM  Clinical Narrative:   CSW spoke with [name] w/ DSS about Palliative wanting to set up GOC meeting. [name] expressed she can meet tomorrow at 10:30AM, Palliative MD confirmed that time to be fine. [name] inquired about the DNR form and provided her email for it to be sent to [facility] .gov.   12:11 PM CSW left [name] a VM to inquire if family will be notified by her of GOC meeting tomorrow. Awaiting call back/confirmation.   1:43PM [name] called CSW back to inform that she got CSWs message and will notify patients family of meeting tomorrow. [name] confirmed she will be there and asked where to meet medical team for the meeting. CSW informed [name] to meet medical team at patients bedside.   CSW will continue to follow.    Expected Discharge Plan: Long Term Nursing Home Barriers to Discharge: Continued Medical Work up               Expected Discharge Plan and Services In-house Referral: Clinical Social Work   Post Acute Care Choice: Skilled Nursing Facility Living arrangements for the past 2 months: Skilled Nursing Facility                                       Social Drivers of Health (SDOH) Interventions SDOH Screenings   Food Insecurity: Patient Unable To Answer (05/20/2024)  Housing: Patient Unable To Answer (05/20/2024)  Transportation Needs: Patient Unable To Answer (03/25/2024)  Utilities: Patient Unable To Answer (03/25/2024)  Depression (PHQ2-9): Low Risk  (05/22/2019)  Social Connections: Unknown (03/25/2024)  Tobacco Use: Medium Risk (05/19/2024)    Readmission Risk Interventions    05/31/2024   12:39 PM 05/21/2024    1:03 PM 02/18/2022    2:23 PM  Readmission Risk Prevention Plan   Transportation Screening Complete Complete Complete  PCP or Specialist Appt within 5-7 Days   Complete  Home Care Screening   Complete  Medication Review (RN CM)   Complete  Medication Review (RN Care Manager) Complete Complete   PCP or Specialist appointment within 3-5 days of discharge Complete Complete   HRI or Home Care Consult Complete Complete   SW Recovery Care/Counseling Consult Complete Complete   Palliative Care Screening Complete Complete   Skilled Nursing Facility Not Applicable Not Applicable

## 2024-06-09 NOTE — Progress Notes (Addendum)
 Nutrition Follow-up  DOCUMENTATION CODES:   Not applicable  INTERVENTION:  - Continue tube feeding via J-tube: Osmolite 1.2 at 60 ml/h (1440 ml per day) Prosource TF20 60 ml daily Provides 1808 kcal, 100 gm protein, 1181 ml free water  daily   -Add 100mg  thiamine  x5 days    - PHOS add-on to this morning's lab draw d/t low PHOS 11/10  -Monitor GOC discussion and modify POC as indicated  -Continue Juven BID for wound healing support   NUTRITION DIAGNOSIS:  Inadequate oral intake related to inability to eat as evidenced by NPO status.  GOAL:  Patient will meet greater than or equal to 90% of their needs  MONITOR:   Weight trends, Labs, TF tolerance, Skin  REASON FOR ASSESSMENT:   Consult Enteral/tube feeding initiation and management  ASSESSMENT:   Pt with PMH significant for: CVA w/ hemiplegia and contractures/aphasia, nursing home resident who is bed bound/ J-tube dependent, GERD, pacemaker, seizure disorder, gastric bypass (2008) and advanced dementia. Returns from nursing home d/t decline in mental status.  11/08- abdominal distention noted 11/09- TF d/c by MD 11/10 - TF re-initiated s/p IR intervention to J-tube site  Positive for pseudomonas sepsis. Resistant to Cipro and formal ID consult requested. Not a candidate for explant of PPM, per cardiology.   TF running at goal rate this morning. No leaking noted at J-tube site observed. GOC meeting scheduled for tomorrow morning with patient's DSS guardian as she is approaching futile care, per MD note (11/10).   Admit Weight: 102.4 kg Current Weight: 97 kg  Continues with some notable pitting edema to BLEs on exam. Generalized, non-pitting to upper extremities. Likely masking some muscle/fat depletions. Not currently on diuretics.  Has shown 6.8% weight loss between admission over last week. Given amount of edema present and fluctuations in weight this admission, likely fluid-related and therefore cannot be used as  a prognostic indicator for malnutrition. Per RN, continues with unstageable wound to coccyx that appears to be tunneling. Bowels stable.   Drains/Lines: R internal jugular: CVC, triple lumen LUQ: J-tube (18Fr) Foley catheter UOP: x24 hours  Meds: famotidine , furosemide , Keppra , KCl, simethicone , IV ABX  Labs: Na+ 130>133>136 (wdl) K+ 3.2>4.1 (wdl) PHOS 2.0 (06/08/2024) Alb <1.5 (06/05/2024) - likely r/t fluid overload and inflammatory response to infection CBGs 151-168 x24 hours A1c 5.7 (05/2024)  NUTRITION - FOCUSED PHYSICAL EXAM:  Flowsheet Row Most Recent Value  Orbital Region No depletion  Upper Arm Region No depletion  Thoracic and Lumbar Region No depletion  Buccal Region No depletion  Temple Region No depletion  Clavicle Bone Region No depletion  Clavicle and Acromion Bone Region No depletion  Scapular Bone Region No depletion  Dorsal Hand No depletion  Patellar Region No depletion  Anterior Thigh Region No depletion  Posterior Calf Region No depletion  Edema (RD Assessment) Moderate  Hair Reviewed  Eyes Unable to assess  Mouth Reviewed  Skin Reviewed  Nails Reviewed    Diet Order:   Diet Order             Diet NPO time specified  Diet effective now            EDUCATION NEEDS:  No education needs have been identified at this time  Skin:  Skin Assessment: Skin Integrity Issues: Skin Integrity Issues:: Unstageable, Other (Comment) DTI: - Unstageable: sacrum Other: MASD to buttocks  Last BM:  11/12 - type 7 x2  Height:  Ht Readings from Last 1 Encounters:  06/05/24 5'  10 (1.778 m)   Weight:  Wt Readings from Last 1 Encounters:  06/09/24 97 kg   Ideal Body Weight:  68.2 kg  BMI:  Body mass index is 30.68 kg/m.  Estimated Nutritional Needs:   Kcal:  1700-1900 kcals  Protein:  90-105g  Fluid:  1.7-1.9L/day  Blair Deaner MS, RD, LDN Registered Dietitian Clinical Nutrition RD Inpatient Contact Info in Amion

## 2024-06-09 NOTE — Care Management Important Message (Signed)
 Important Message  Patient Details  Name: Kathleen Jensen MRN: [id number] Date of Birth: 02-Sep-1950   Important Message Given:  Yes - Medicare IM     [name] 06/09/2024, 2:19 PM

## 2024-06-09 NOTE — Progress Notes (Signed)
 PROGRESS NOTE    Kathleen Jensen  FMW:[id number] DOB: 1950/10/04 DOA: 06/04/2024 PCP: System, Provider Not In     Chief Complaint  Patient presents with   Altered Mental Status    Brief Narrative:   73 y.o. 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   Significant events: -  8/8-->04/07/19/2025 patient for MRSA bacteremia/endocarditis, not a candidate for pacemaker explant, will need lifelong cefadroxil . -  10/22-11/3 admitted due to Pseudomonas UTI/Foley catheter  And feeding tube leakage CT showed jejunostomy in place adynamic ileus and treated for a UTI secondary to Pseudomonas palliative care was consulted and palliative/hospice was recommended as an outpatient  - 11/7 admitted for sepsis/encephalopathy .  Significant for Pseudomonas bacteremia Blood culture now growing Pseudomonas 1/3 bottle - 11/9 more hypotensive central line placed by CCM right IJ   Assessment & Plan:   Principal Problem:   Sepsis (HCC) Active Problems:   Seizures (HCC)   OBSTRUCTIVE SLEEP APNEA   Essential hypertension   History of expressive aphasia   Pulmonary embolism and infarction (HCC)   PAF (paroxysmal atrial fibrillation) (HCC)   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HCC)   Bedbound     Severe sepsis, POA due to Pseudomonas bacteremia -Recent hospitalization for Pseudomonas UTI. - Bently with 1 blood culture growing Pseudomonas bacteremia -Patient was discharged on Cipro, apparently it resistant to Cipro, continue with Zosyn. - Will await further recommendations from ID.SABRA Hypernatremia - Improved.  Continue free water  per tube   Hyponatremia -  Mild.  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  Continue amiodarone  and metoprolol    Essential hypertension--continue metoprolol    Diabetes mellitus type 2  - Overall CBG is controlled, continue to monitor CBG.   GERD -Continue famotidine    Hypokalemia - Replace   Thrombocytopenia - Mild.  At baseline   Nutrition Dysphagia - Continue jejunostomy tube feeding as per dietitian recommendations.  Continue free water  per tube  Goals of care - Overall prognosis is poor.    Palliative care consult greatly appreciated, plan for DSS/family meeting tomorrow.     Obesity class I - Outpatient follow-up  Sick sinus syndrome not a candidate for explantation - Is maintained on monitors as normal sinus rhythm for now  DVT prophylaxis: (Lovenox ) Code Status: (Full) Family Communication: ( none at bedside ) Disposition:   Status is: Inpatient    Consultants:  Palliative  ID   Subjective:  He is nonverbal, she is unable to provide any complaints  Objective: Vitals:   06/09/24 0500 06/09/24 0700 06/09/24 0835 06/09/24 1153  BP:  110/76  131/66  Pulse:   81   Resp:      Temp:  98.4 F (36.9 C) 99.3 F (37.4 C) 99.2 F (  37.3 C)  TempSrc:   Oral Oral  SpO2:   99%   Weight: 97 kg     Height:        Intake/Output Summary (Last 24 hours) at 06/09/2024 1424 Last data filed at 06/09/2024 0800 Gross per 24 hour  Intake 1251.98 ml  Output  1235 ml  Net 16.98 ml   Filed Weights   06/07/24 0455 06/08/24 0315 06/09/24 0500  Weight: 106.7 kg 105.7 kg 97 kg    Examination:  Significantly debilitated, does not follow any commands, or answer any questions, does not open her eyes Right IJ TLC present, site looks clean Clear entry bilaterally, no rhonchi or wheezing Abdomen mildly distended, obese, PEG present Trace lower extremity edema   Data Reviewed: I have personally reviewed following labs and imaging studies  CBC: Recent Labs  Lab 06/04/24 1711 06/05/24 0500 06/06/24 0241 06/07/24 0325 06/08/24 0328 06/09/24 0610  WBC 10.0 12.5* 10.5 9.1 8.3 8.6  NEUTROABS 6.0  --  4.5 5.1 4.7 5.0  HGB 10.5* 9.9* 10.3* 9.2* 10.1* 9.2*  HCT 32.5* 31.4* 32.5* 28.3* 31.2* 28.7*  MCV 109.1* 109.4* 109.1* 106.0* 107.6* 110.4*  PLT 154 144* 156 129* 124* 106*    Basic Metabolic Panel: Recent Labs  Lab 06/05/24 0500 06/06/24 0241 06/07/24 0325 06/08/24 0328 06/09/24 0610  NA 132* 131* 130* 133* 136  K 4.5 4.1 3.6 3.2* 4.1  CL 101 99 101 103 102  CO2 23 23 24 25 27   GLUCOSE 79 76 84 151* 168*  BUN 18 15 10 9 23   CREATININE 0.80 0.71 0.63 0.66 0.60  CALCIUM  7.6* 7.7* 7.4* 7.6* 7.4*  MG 1.8 1.7 1.8 1.9 2.4  PHOS 3.1 3.7 2.5 2.0*  --     GFR: Estimated Creatinine Clearance: 79 mL/min (by C-G formula based on SCr of 0.6 mg/dL).  Liver Function Tests: Recent Labs  Lab 06/04/24 1711 06/05/24 0500  AST 48* 41  ALT 14 15  ALKPHOS 183* 140*  BILITOT 0.9 0.9  PROT 8.8* 8.2*  ALBUMIN  <1.5* <1.5*    CBG: Recent Labs  Lab 06/08/24 1617 06/08/24 2024 06/08/24 2318 06/09/24 0739 06/09/24 1156  GLUCAP 156* 81 109* 120* 154*     Recent Results (from the past 240 hours)  Resp panel by RT-PCR (RSV, Flu A&B, Covid)     Status: None   Collection Time: 06/04/24  4:50 PM   Specimen: Nasal Swab  Result Value Ref Range Status   SARS Coronavirus 2 by RT PCR NEGATIVE NEGATIVE Final   Influenza A by PCR NEGATIVE  NEGATIVE Final   Influenza B by PCR NEGATIVE NEGATIVE Final    Comment: (NOTE) The Xpert Xpress SARS-CoV-2/FLU/RSV plus assay is intended as an aid in the diagnosis of influenza from Nasopharyngeal swab specimens and should not be used as a sole basis for treatment. Nasal washings and aspirates are unacceptable for Xpert Xpress SARS-CoV-2/FLU/RSV testing.  Fact Sheet for Patients: bloggercourse.com  Fact Sheet for Healthcare Providers: seriousbroker.it  This test is not yet approved or cleared by the [place]  FDA and has been authorized for detection and/or diagnosis of SARS-CoV-2 by FDA under an Emergency Use Authorization (EUA). This EUA will remain in effect (meaning this test can be used) for the duration of the COVID-19 declaration under Section 564(b)(1) of the Act, 21 U.S.C. section 360bbb-3(b)(1), unless the authorization is terminated or revoked.     Resp Syncytial Virus by PCR NEGATIVE NEGATIVE Final    Comment: (NOTE) Fact Sheet  for Patients: bloggercourse.com  Fact Sheet for Healthcare Providers: seriousbroker.it  This test is not yet approved or cleared by the [place]  FDA and has been authorized for detection and/or diagnosis of SARS-CoV-2 by FDA under an Emergency Use Authorization (EUA). This EUA will remain in effect (meaning this test can be used) for the duration of the COVID-19 declaration under Section 564(b)(1) of the Act, 21 U.S.C. section 360bbb-3(b)(1), unless the authorization is terminated or revoked.  Performed at [facility], [address] [place], [place] [address]   Urine Culture     Status: None   Collection Time: 06/04/24  4:52 PM   Specimen: Urine, Catheterized  Result Value Ref Range Status   Specimen Description URINE, CATHETERIZED  Final   Special Requests NONE  Final   Culture   Final    NO GROWTH Performed at  [facility], [address], [place], [place] [address]    Report Status 06/05/2024 FINAL  Final  Blood Culture (routine x 2)     Status: None   Collection Time: 06/04/24  4:55 PM   Specimen: BLOOD RIGHT ARM  Result Value Ref Range Status   Specimen Description BLOOD RIGHT ARM  Final   Special Requests   Final    BOTTLES DRAWN AEROBIC ONLY Blood Culture results may not be optimal due to an inadequate volume of blood received in culture bottles   Culture   Final    NO GROWTH 5 DAYS Performed at [facility], [address] [place], [place] [address]    Report Status 06/09/2024 FINAL  Final  Blood Culture (routine x 2)     Status: Abnormal   Collection Time: 06/04/24  5:35 PM   Specimen: BLOOD LEFT HAND  Result Value Ref Range Status   Specimen Description BLOOD LEFT HAND  Final   Special Requests   Final    BOTTLES DRAWN AEROBIC AND ANAEROBIC Blood Culture results may not be optimal due to an inadequate volume of blood received in culture bottles   Culture  Setup Time   Final    GRAM NEGATIVE RODS AEROBIC BOTTLE ONLY RBV V  BRYKPHARMD  06/06/2024 @0617  BY DD Performed at [facility], [address] [place], [place] [address]    Culture PSEUDOMONAS AERUGINOSA (A)  Final   Report Status 06/08/2024 FINAL  Final   Organism ID, Bacteria PSEUDOMONAS AERUGINOSA  Final      Susceptibility   Pseudomonas aeruginosa - MIC*    MEROPENEM <=0.25 SENSITIVE Sensitive     CIPROFLOXACIN 2 RESISTANT Resistant     IMIPENEM 2 SENSITIVE Sensitive     PIP/TAZO Value in next row Sensitive      8 SENSITIVEThis is a modified FDA-approved test that has been validated and its performance characteristics determined by the reporting laboratory.  This laboratory is certified under the Clinical Laboratory Improvement Amendments CLIA as qualified to perform high complexity clinical laboratory testing.    CEFTAZIDIME/AVIBACTAM Value in next row Sensitive      8 SENSITIVEThis is a modified  FDA-approved test that has been validated and its performance characteristics determined by the reporting laboratory.  This laboratory is certified under the Clinical Laboratory Improvement Amendments CLIA as qualified to perform high complexity clinical laboratory testing.    CEFTOLOZANE/TAZOBACTAM Value in next row Sensitive      8 SENSITIVEThis is a modified FDA-approved test that has been validated and its performance characteristics determined by the reporting laboratory.  This laboratory is certified under the Clinical Laboratory Improvement Amendments CLIA as qualified to perform high complexity clinical laboratory testing.    TOBRAMYCIN Value in next row Sensitive      8 SENSITIVEThis is a modified FDA-approved test that has been validated and its performance characteristics determined by the reporting laboratory.  This laboratory is certified under the Clinical Laboratory Improvement Amendments CLIA as qualified to perform high complexity clinical laboratory testing.    CEFTAZIDIME Value in next row Sensitive      8 SENSITIVEThis is a modified FDA-approved test that has been validated and its performance characteristics determined by the reporting laboratory.  This laboratory is certified under the Clinical Laboratory Improvement Amendments CLIA as qualified to perform high complexity clinical laboratory testing.    * PSEUDOMONAS AERUGINOSA  Blood Culture ID Panel (Reflexed)     Status: Abnormal   Collection Time: 06/04/24  5:35 PM  Result Value Ref Range Status   Enterococcus faecalis NOT DETECTED NOT DETECTED Final   Enterococcus Faecium NOT DETECTED NOT DETECTED Final   Listeria monocytogenes NOT DETECTED NOT DETECTED Final   Staphylococcus species NOT DETECTED NOT DETECTED Final   Staphylococcus aureus (BCID) NOT DETECTED NOT DETECTED Final   Staphylococcus epidermidis NOT DETECTED NOT DETECTED Final   Staphylococcus lugdunensis NOT DETECTED NOT DETECTED Final   Streptococcus species  NOT DETECTED NOT DETECTED Final   Streptococcus agalactiae NOT DETECTED NOT DETECTED Final   Streptococcus pneumoniae NOT DETECTED NOT DETECTED Final   Streptococcus pyogenes NOT DETECTED NOT DETECTED Final   A.calcoaceticus-baumannii NOT DETECTED NOT DETECTED Final   Bacteroides fragilis NOT DETECTED NOT DETECTED Final   Enterobacterales NOT DETECTED NOT DETECTED Final   Enterobacter cloacae complex NOT DETECTED NOT DETECTED Final   Escherichia coli NOT DETECTED NOT DETECTED Final   Klebsiella aerogenes NOT DETECTED NOT DETECTED Final   Klebsiella oxytoca NOT DETECTED NOT DETECTED Final   Klebsiella pneumoniae NOT DETECTED NOT DETECTED Final   Proteus species NOT DETECTED NOT DETECTED Final   Salmonella species NOT DETECTED NOT DETECTED Final   Serratia marcescens NOT DETECTED NOT DETECTED Final   Haemophilus influenzae NOT DETECTED NOT DETECTED Final   Neisseria meningitidis NOT DETECTED NOT DETECTED Final   Pseudomonas aeruginosa DETECTED (A) NOT DETECTED Final    Comment: CRITICAL RESULT CALLED TO, READ BACK BY AND VERIFIED WITH: RBV V  BRYKPHARMD  06/06/2024 @0617  BY DD    Stenotrophomonas maltophilia NOT DETECTED NOT DETECTED Final   Candida albicans NOT DETECTED NOT DETECTED Final   Candida auris NOT DETECTED NOT DETECTED Final   Candida glabrata NOT DETECTED NOT DETECTED Final   Candida krusei NOT DETECTED NOT DETECTED Final   Candida parapsilosis NOT DETECTED NOT DETECTED Final   Candida tropicalis NOT DETECTED NOT DETECTED Final   Cryptococcus neoformans/gattii NOT DETECTED NOT DETECTED Final   CTX-M ESBL NOT DETECTED NOT DETECTED Final   Carbapenem resistance IMP NOT DETECTED NOT DETECTED Final   Carbapenem resistance KPC NOT DETECTED NOT DETECTED Final   Carbapenem resistance NDM NOT DETECTED NOT DETECTED Final   Carbapenem resistance VIM NOT DETECTED NOT DETECTED Final    Comment: Performed at [facility], [address] [place], [place] [address]  Respiratory  (~20 pathogens) panel by PCR     Status: None   Collection Time: 06/05/24  2:30 AM   Specimen: Nasopharyngeal Swab; Respiratory  Result Value Ref Range Status   Adenovirus NOT DETECTED NOT DETECTED Final   Coronavirus 229E NOT DETECTED NOT DETECTED  Final    Comment: (NOTE) The Coronavirus on the Respiratory Panel, DOES NOT test for the novel  Coronavirus (2019 nCoV)    Coronavirus HKU1 NOT DETECTED NOT DETECTED Final   Coronavirus NL63 NOT DETECTED NOT DETECTED Final   Coronavirus OC43 NOT DETECTED NOT DETECTED Final   Metapneumovirus NOT DETECTED NOT DETECTED Final   Rhinovirus / Enterovirus NOT DETECTED NOT DETECTED Final   Influenza A NOT DETECTED NOT DETECTED Final   Influenza B NOT DETECTED NOT DETECTED Final   Parainfluenza Virus 1 NOT DETECTED NOT DETECTED Final   Parainfluenza Virus 2 NOT DETECTED NOT DETECTED Final   Parainfluenza Virus 3 NOT DETECTED NOT DETECTED Final   Parainfluenza Virus 4 NOT DETECTED NOT DETECTED Final   Respiratory Syncytial Virus NOT DETECTED NOT DETECTED Final   Bordetella pertussis NOT DETECTED NOT DETECTED Final   Bordetella Parapertussis NOT DETECTED NOT DETECTED Final   Chlamydophila pneumoniae NOT DETECTED NOT DETECTED Final   Mycoplasma pneumoniae NOT DETECTED NOT DETECTED Final    Comment: Performed at [facility], [address], [place], [place] [address]         Radiology Studies: No results found.      Scheduled Meds:  amiodarone   200 mg Per Tube Daily   Chlorhexidine  Gluconate Cloth  6 each Topical Daily   collagenase   Topical Daily   enoxaparin  (LOVENOX ) injection  40 mg Subcutaneous Q24H   estradiol  1 Applicatorful Vaginal Once per day on Tuesday Friday   famotidine   20 mg Per Tube BID   feeding supplement (PROSource TF20)  60 mL Per Tube Daily   furosemide   20 mg Oral Daily   Gerhardt's butt cream   Topical TID   levETIRAcetam   1,000 mg Per Tube BID   nutrition supplement (JUVEN)  1 packet Per Tube BID BM    potassium chloride   20 mEq Oral Daily   simethicone   80 mg Per Tube TID   thiamine   100 mg Per Tube Daily   valproic  acid  500 mg Per Tube TID   valproic  acid  750 mg Per Tube QHS   Continuous Infusions:  feeding supplement (OSMOLITE 1.2 CAL) 60 mL/hr at 06/09/24 0000   piperacillin-tazobactam (ZOSYN)  IV 3.375 g (06/09/24 0812)     LOS: 3 days      Brayton Lye, MD [facility]   To contact the attending provider between 7A-7P or the covering provider during after hours 7P-7A, please log into the web site [email or web address] and access using universal [place] password for that web site. If you do not have the password, please call the hospital operator.  06/09/2024, 2:24 PM

## 2024-06-09 NOTE — Consult Note (Signed)
 [facility]    Date of Admission:  06/04/2024     Total days of antibiotics 6               Reason for Consult: Pseudomonas Bacteremia    Referring Provider: Dr. 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 All other imaging with no source of infection. Discussed plan of care with daughter at beside to continue with current dose of piperacillin-tazobactam with plan to treat for 7-10 days pending goals of care and then surveillance blood cultures in a couple of weeks to determine any recurrence as there is a chance the pacemaker could be infected with Pseudomonas as well. Unfortunately the isolate is resistant to flouroquinolones and no oral options are available for suppression. She will need to continue with Cefadroxil after completion of treatment for suppression of previous MSSE infection. Palliative Care meeting set up for tomorrow to discuss goals of care. Standard/universal precautions. Remaining medical and supportive care per Internal Medicine.   PLAN:  Continue current dose of piperacillin-tazobactam. Palliative Care and goals of care meeting tomorrow. Standard/universal precautions. Will need to return to Cefadroxil for suppression of previous MSSE infection at the end of treatment. Remaining medical and supportive care per Internal Medicine.    Principal Problem:   Sepsis (HCC) Active Problems:   OBSTRUCTIVE SLEEP APNEA   Essential hypertension   History of expressive aphasia   Pulmonary embolism and infarction (HCC)   Seizures (HCC)   PAF (paroxysmal atrial fibrillation) (HCC)   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HCC)   Bedbound     amiodarone   200 mg Per Tube Daily   Chlorhexidine  Gluconate Cloth  6 each Topical Daily   collagenase   Topical Daily   enoxaparin  (LOVENOX ) injection  40 mg Subcutaneous Q24H   estradiol  1 Applicatorful Vaginal Once per day on Tuesday Friday   famotidine   20 mg Per Tube BID   feeding supplement (PROSource TF20)  60 mL Per Tube Daily   furosemide   20 mg Oral Daily   Gerhardt's butt cream   Topical TID   levETIRAcetam   1,000 mg Per Tube BID   nutrition supplement (JUVEN)  1 packet Per Tube BID BM   potassium chloride   20 mEq Oral Daily   simethicone   80 mg Per Tube TID   thiamine   100 mg Per Tube Daily   valproic  acid  500 mg Per Tube TID   valproic  acid  750 mg Per Tube QHS     HPI: Kathleen Jensen is a 73 y.o. 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  Kathleen Jensen is known to the ID service having last been seen by Dr. Overton on 05/06/2024 with recent MSSE bacteremia in the setting of pacemaker and TEE showing fibrinous strands attached to both RA and RV leads in the right atrium.  Treated with 6 weeks of cefazolin  and transition to cefadroxil for suppression.  At that office visit was noted  to have mild hypotension.  Kathleen Jensen presents from her skilled facility with concern for infected G-tube and altered mental status.  Febrile on admission with a temperature of 100.5 F and leukocytosis with white blood cell count of 12,500.  Started on broad-spectrum antibiotics with vancomycin , piperacillin/tazobactam, and cefadroxil.  Chest x-ray with no cardiopulmonary processes.  CT head with no intracranial abnormalities.  CT chest/abdomen/pelvis with no acute findings.   Urine was unremarkable for infection.  Initial blood cultures positive for Pseudomonas bacteremia resistant to ciprofloxacin.  Kathleen Jensen is currently on day 6 of antimicrobial therapy with antibiotics narrowed to piperacillin-tazobactam.  Has been afebrile with leukocytosis normalized and most recent white blood cell count 8600.  Tolerating antibiotics with no adverse side effects.  ID has been asked for antibiotic recommendations.  She is not a candidate for pacemaker explant. Daughter at bedside provides background information. Previously more verbal and has seen a decline in the last several months.    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)    Hyperlipemia    Hypertension    Insomnia    Intracerebral hemorrhage, intraventricular (HCC)    Muscle weakness    Other abnormalities of gait and mobility    Seizures (HCC)    Stroke (HCC)    Unsteadiness on feet    Vitamin B deficiency     Social History   Tobacco Use   Smoking status: Former    Current packs/day: 0.00    Types: Cigarettes    Start date: 07/30/1975    Quit date: 07/30/1979    Years since quitting: 44.8   Smokeless tobacco: Never  Vaping Use   Vaping status: Never Used  Substance Use Topics   Alcohol use: No    Alcohol/week: 0.0 standard drinks of alcohol   Drug use: No    Family History  Problem Relation Age of Onset   High blood pressure Mother    Diabetes Mother    Diabetes Father    Stroke Father    Cancer Sister         Colorectal cancer   Cancer Brother        colon    Allergies  Allergen Reactions   Tape Rash   Demerol [Meperidine Hcl] Nausea And Vomiting   Sulfonamide Derivatives Itching   Bacitracin-Polymyxin B Dermatitis    Not listed on the MAR   Oxycontin  [Oxycodone ] Itching    OBJECTIVE: Blood pressure 131/66, pulse 81, temperature 99.2 F (37.3 C), temperature source Oral, resp. rate (!) 22, height 5' 10 (1.778 m), weight 97 kg, SpO2 99%.  Physical Exam Constitutional:      General: She is not in acute distress.    Appearance: She is well-developed. She is ill-appearing.  Cardiovascular:     Rate and Rhythm: Normal rate and regular rhythm.     Heart sounds: Normal heart sounds.     Comments: Central line right upper neck Pulmonary:     Effort: Pulmonary effort is  normal.     Breath sounds: Normal breath sounds.  Abdominal:     Comments: PEG tube present.   Skin:    General: Skin is warm and dry.     Lab Results Lab Results  Component Value Date   WBC 8.6 06/09/2024   HGB 9.2 (L) 06/09/2024   HCT 28.7 (L) 06/09/2024   MCV 110.4 (H) 06/09/2024   PLT 106 (L) 06/09/2024    Lab Results  Component Value Date   CREATININE 0.60 06/09/2024   BUN 23 06/09/2024   NA 136 06/09/2024   K 4.1 06/09/2024   CL 102 06/09/2024   CO2 27 06/09/2024    Lab Results  Component Value Date   ALT 15 06/05/2024   AST 41 06/05/2024   ALKPHOS 140 (H) 06/05/2024   BILITOT 0.9 06/05/2024     Microbiology: Recent Results (from the past 240 hours)  Resp panel by RT-PCR (RSV, Flu A&B, Covid)     Status: None   Collection Time: 06/04/24  4:50 PM   Specimen: Nasal Swab  Result Value Ref Range Status   SARS Coronavirus 2 by RT PCR NEGATIVE NEGATIVE Final   Influenza A by PCR NEGATIVE NEGATIVE Final   Influenza B by PCR NEGATIVE NEGATIVE Final    Comment: (NOTE) The Xpert Xpress SARS-CoV-2/FLU/RSV plus assay is intended as an aid in the diagnosis of influenza from Nasopharyngeal swab  specimens and should not be used as a sole basis for treatment. Nasal washings and aspirates are unacceptable for Xpert Xpress SARS-CoV-2/FLU/RSV testing.  Fact Sheet for Patients: bloggercourse.com  Fact Sheet for Healthcare Providers: seriousbroker.it  This test is not yet approved or cleared by the [place]  FDA and has been authorized for detection and/or diagnosis of SARS-CoV-2 by FDA under an Emergency Use Authorization (EUA). This EUA will remain in effect (meaning this test can be used) for the duration of the COVID-19 declaration under Section 564(b)(1) of the Act, 21 U.S.C. section 360bbb-3(b)(1), unless the authorization is terminated or revoked.     Resp Syncytial Virus by PCR NEGATIVE NEGATIVE Final    Comment: (NOTE) Fact Sheet for Patients: bloggercourse.com  Fact Sheet for Healthcare Providers: seriousbroker.it  This test is not yet approved or cleared by the [place]  FDA and has been authorized for detection and/or diagnosis of SARS-CoV-2 by FDA under an Emergency Use Authorization (EUA). This EUA will remain in effect (meaning this test can be used) for the duration of the COVID-19 declaration under Section 564(b)(1) of the Act, 21 U.S.C. section 360bbb-3(b)(1), unless the authorization is terminated or revoked.  Performed at [facility], [address], [place], [place] [address]   Urine Culture     Status: None   Collection Time: 06/04/24  4:52 PM   Specimen: Urine, Catheterized  Result Value Ref Range Status   Specimen Description URINE, CATHETERIZED  Final   Special Requests NONE  Final   Culture   Final    NO GROWTH Performed at [facility], [address], [place], [place] [address]    Report Status 06/05/2024 FINAL  Final  Blood Culture (routine x 2)     Status: None   Collection Time: 06/04/24  4:55 PM   Specimen: BLOOD  RIGHT ARM  Result Value Ref Range Status   Specimen Description BLOOD RIGHT ARM  Final   Special Requests   Final    BOTTLES DRAWN AEROBIC ONLY Blood Culture results may not be optimal due to an  inadequate volume of blood received in culture bottles   Culture   Final    NO GROWTH 5 DAYS Performed at [facility], [address], [place], [place] [address]    Report Status 06/09/2024 FINAL  Final  Blood Culture (routine x 2)     Status: Abnormal   Collection Time: 06/04/24  5:35 PM   Specimen: BLOOD LEFT HAND  Result Value Ref Range Status   Specimen Description BLOOD LEFT HAND  Final   Special Requests   Final    BOTTLES DRAWN AEROBIC AND ANAEROBIC Blood Culture results may not be optimal due to an inadequate volume of blood received in culture bottles   Culture  Setup Time   Final    GRAM NEGATIVE RODS AEROBIC BOTTLE ONLY RBV V  BRYKPHARMD  06/06/2024 @0617  BY DD Performed at [facility], [address] [place], [place] [address]    Culture PSEUDOMONAS AERUGINOSA (A)  Final   Report Status 06/08/2024 FINAL  Final   Organism ID, Bacteria PSEUDOMONAS AERUGINOSA  Final      Susceptibility   Pseudomonas aeruginosa - MIC*    MEROPENEM <=0.25 SENSITIVE Sensitive     CIPROFLOXACIN 2 RESISTANT Resistant     IMIPENEM 2 SENSITIVE Sensitive     PIP/TAZO Value in next row Sensitive      8 SENSITIVEThis is a modified FDA-approved test that has been validated and its performance characteristics determined by the reporting laboratory.  This laboratory is certified under the Clinical Laboratory Improvement Amendments CLIA as qualified to perform high complexity clinical laboratory testing.    CEFTAZIDIME/AVIBACTAM Value in next row Sensitive      8 SENSITIVEThis is a modified FDA-approved test that has been validated and its performance characteristics determined by the reporting laboratory.  This laboratory is certified under the Clinical Laboratory Improvement Amendments CLIA as  qualified to perform high complexity clinical laboratory testing.    CEFTOLOZANE/TAZOBACTAM Value in next row Sensitive      8 SENSITIVEThis is a modified FDA-approved test that has been validated and its performance characteristics determined by the reporting laboratory.  This laboratory is certified under the Clinical Laboratory Improvement Amendments CLIA as qualified to perform high complexity clinical laboratory testing.    TOBRAMYCIN Value in next row Sensitive      8 SENSITIVEThis is a modified FDA-approved test that has been validated and its performance characteristics determined by the reporting laboratory.  This laboratory is certified under the Clinical Laboratory Improvement Amendments CLIA as qualified to perform high complexity clinical laboratory testing.    CEFTAZIDIME Value in next row Sensitive      8 SENSITIVEThis is a modified FDA-approved test that has been validated and its performance characteristics determined by the reporting laboratory.  This laboratory is certified under the Clinical Laboratory Improvement Amendments CLIA as qualified to perform high complexity clinical laboratory testing.    * PSEUDOMONAS AERUGINOSA  Blood Culture ID Panel (Reflexed)     Status: Abnormal   Collection Time: 06/04/24  5:35 PM  Result Value Ref Range Status   Enterococcus faecalis NOT DETECTED NOT DETECTED Final   Enterococcus Faecium NOT DETECTED NOT DETECTED Final   Listeria monocytogenes NOT DETECTED NOT DETECTED Final   Staphylococcus species NOT DETECTED NOT DETECTED Final   Staphylococcus aureus (BCID) NOT DETECTED NOT DETECTED Final   Staphylococcus epidermidis NOT DETECTED NOT DETECTED Final   Staphylococcus lugdunensis NOT DETECTED NOT DETECTED Final   Streptococcus species NOT DETECTED NOT DETECTED Final  Streptococcus agalactiae NOT DETECTED NOT DETECTED Final   Streptococcus pneumoniae NOT DETECTED NOT DETECTED Final   Streptococcus pyogenes NOT DETECTED NOT DETECTED Final    A.calcoaceticus-baumannii NOT DETECTED NOT DETECTED Final   Bacteroides fragilis NOT DETECTED NOT DETECTED Final   Enterobacterales NOT DETECTED NOT DETECTED Final   Enterobacter cloacae complex NOT DETECTED NOT DETECTED Final   Escherichia coli NOT DETECTED NOT DETECTED Final   Klebsiella aerogenes NOT DETECTED NOT DETECTED Final   Klebsiella oxytoca NOT DETECTED NOT DETECTED Final   Klebsiella pneumoniae NOT DETECTED NOT DETECTED Final   Proteus species NOT DETECTED NOT DETECTED Final   Salmonella species NOT DETECTED NOT DETECTED Final   Serratia marcescens NOT DETECTED NOT DETECTED Final   Haemophilus influenzae NOT DETECTED NOT DETECTED Final   Neisseria meningitidis NOT DETECTED NOT DETECTED Final   Pseudomonas aeruginosa DETECTED (A) NOT DETECTED Final    Comment: CRITICAL RESULT CALLED TO, READ BACK BY AND VERIFIED WITH: RBV V  BRYKPHARMD  06/06/2024 @0617  BY DD    Stenotrophomonas maltophilia NOT DETECTED NOT DETECTED Final   Candida albicans NOT DETECTED NOT DETECTED Final   Candida auris NOT DETECTED NOT DETECTED Final   Candida glabrata NOT DETECTED NOT DETECTED Final   Candida krusei NOT DETECTED NOT DETECTED Final   Candida parapsilosis NOT DETECTED NOT DETECTED Final   Candida tropicalis NOT DETECTED NOT DETECTED Final   Cryptococcus neoformans/gattii NOT DETECTED NOT DETECTED Final   CTX-M ESBL NOT DETECTED NOT DETECTED Final   Carbapenem resistance IMP NOT DETECTED NOT DETECTED Final   Carbapenem resistance KPC NOT DETECTED NOT DETECTED Final   Carbapenem resistance NDM NOT DETECTED NOT DETECTED Final   Carbapenem resistance VIM NOT DETECTED NOT DETECTED Final    Comment: Performed at [facility], [address], [place], [place] [address]  Respiratory (~20 pathogens) panel by PCR     Status: None   Collection Time: 06/05/24  2:30 AM   Specimen: Nasopharyngeal Swab; Respiratory  Result Value Ref Range Status   Adenovirus NOT DETECTED NOT DETECTED Final    Coronavirus 229E NOT DETECTED NOT DETECTED Final    Comment: (NOTE) The Coronavirus on the Respiratory Panel, DOES NOT test for the novel  Coronavirus (2019 nCoV)    Coronavirus HKU1 NOT DETECTED NOT DETECTED Final   Coronavirus NL63 NOT DETECTED NOT DETECTED Final   Coronavirus OC43 NOT DETECTED NOT DETECTED Final   Metapneumovirus NOT DETECTED NOT DETECTED Final   Rhinovirus / Enterovirus NOT DETECTED NOT DETECTED Final   Influenza A NOT DETECTED NOT DETECTED Final   Influenza B NOT DETECTED NOT DETECTED Final   Parainfluenza Virus 1 NOT DETECTED NOT DETECTED Final   Parainfluenza Virus 2 NOT DETECTED NOT DETECTED Final   Parainfluenza Virus 3 NOT DETECTED NOT DETECTED Final   Parainfluenza Virus 4 NOT DETECTED NOT DETECTED Final   Respiratory Syncytial Virus NOT DETECTED NOT DETECTED Final   Bordetella pertussis NOT DETECTED NOT DETECTED Final   Bordetella Parapertussis NOT DETECTED NOT DETECTED Final   Chlamydophila pneumoniae NOT DETECTED NOT DETECTED Final   Mycoplasma pneumoniae NOT DETECTED NOT DETECTED Final    Comment: Performed at [facility], [address] [place], [place] [address]     Cathlyn July, NP [facility]  06/09/2024  2:57 PM

## 2024-06-09 NOTE — Plan of Care (Signed)

## 2024-06-09 NOTE — Progress Notes (Signed)
 Daily Progress Note   Patient Name: Kathleen Jensen       Date: 06/09/2024 DOB: [birth year]  Age: 73 y.o. MRN#: [id number] Attending Physician: Sherlon Brayton RAMAN, MD Primary Care Physician: System, Provider Not In Admit Date: 06/04/2024 Length of Stay: 3 days  Reason for Consultation/Follow-up: Establishing goals of care  Subjective:   Subjective: EMR reviewed this morning including notes from hospitalist and transition of care team.  Transition of care continues to reach out to DSS.  Labs reviewed and remarkable for sodium 136, potassium 4.1, creatinine 0.6, white blood count 8.6, hemoglobin 9.2, platelets 106.  I saw and examined patient today.  No family present at the bedside at time of my encounter.  Patient barely opens eyes this morning.  Does not communicate.  She does not follow commands.  Call placed again to DSS with no answer.  Received update from social work later in the afternoon that there was return call from DSS guardian who is requesting a meeting tomorrow at 10:30 AM.  Review of Systems Unable to obtain  Objective:   Vital Signs:  BP 131/66 (BP Location: Right Arm)   Pulse 81   Temp 99.2 F (37.3 C) (Oral)   Resp (!) 22   Ht 5' 10 (1.778 m)   Wt 97 kg   SpO2 99%   BMI 30.68 kg/m   Physical Exam: General: NAD, alert Eyes: conjunctiva clear, anicteric sclera HENT: normocephalic, atraumatic, moist mucous membranes Cardiovascular: RRR, no edema in LE b/l Respiratory: no increased work of breathing noted, not in respiratory distress Abdomen: not distended Extremities:  Skin: no rashes or lesions on visible skin Neuro: A&Ox4, following commands easily Psych: appropriately answers all questions  Imaging: @IMAGES @  I personally reviewed recent imaging.   Assessment & Plan:   Assessment: 73 year old female who is a long-term nursing home with history of CVA, bedbound, chronic PEG, chronic Foley, seizure disorder, dementia, endocarditis  on lifelong suppressive antibiotics admitted with sepsis and altered mental status   Recommendations/Plan: # Complex medical decision making/goals of care:  - Patient unable to participate in complex medical decision making due to underlying medical condition  - DSS is reached out and requested meeting tomorrow at 10:30 AM to discuss long-term goals of care.  - Paperwork completed by myself and Dr. Samtani with recommendation for no attempts at resuscitation in the event of natural death.  Will plan to discuss further at meeting tomorrow with DSS and family.   -  Code Status: Full Code  Prognosis: Unable to determine  # Symptom management: Patient is receiving these palliative interventions for symptom management with an intent to improve quality of life.   - Pain: Acetaminophen  as needed  - Nausea: Zofran  as needed  # Psychosocial Support:  -- Support System[place]  She has children who come to visit her hospital and continue to support her.  It is not clear the level of involvement due to her having legal guardian.  Will need to clarify with DSS before further meetings are set up or information is shared. - Desire for further Chaplain support:no  # Discharge Planning: To Be Determined  -  Discussed with: bedside staff  Thank you for allowing the palliative care team to participate in the care Mane Trudy Collet.  Amaryllis Meissner, MD Palliative Care Provider PMT # [phone]  If patient remains symptomatic despite maximum doses, please call PMT at [phone] between 0700 and 1900. Outside of these hours, please call attending, as PMT  does not have night coverage.   I personally spent a total of 25 minutes in the care of the patient today including preparing to see the patient, getting/reviewing separately obtained history, performing a medically appropriate exam/evaluation, and documenting clinical information in the EHR.

## 2024-06-09 NOTE — Progress Notes (Signed)
 [id number] [facility] Liaison Note  This patient is currently enrolled in [facility] outpatient-based palliative care.  Hospital Liaison will continue to follow for discharge disposition.  Please call for any outpatient-based palliative care related questions or concerns.  Thank you Inocente Jacobs BSN RN [facility] Liaision [phone]

## 2024-06-10 DIAGNOSIS — A419 Sepsis, unspecified organism: Secondary | ICD-10-CM | POA: Diagnosis not present

## 2024-06-10 DIAGNOSIS — Z7189 Other specified counseling: Secondary | ICD-10-CM

## 2024-06-10 DIAGNOSIS — I959 Hypotension, unspecified: Secondary | ICD-10-CM | POA: Diagnosis not present

## 2024-06-10 LAB — CBC WITH DIFFERENTIAL/PLATELET
Abs Immature Granulocytes: 0.03 K/uL (ref 0.00–0.07)
Basophils Absolute: 0 K/uL (ref 0.0–0.1)
Basophils Relative: 0 %
Eosinophils Absolute: 0.1 K/uL (ref 0.0–0.5)
Eosinophils Relative: 1 %
HCT: 27.8 % — ABNORMAL LOW (ref 36.0–46.0)
Hemoglobin: 8.8 g/dL — ABNORMAL LOW (ref 12.0–15.0)
Immature Granulocytes: 0 %
Lymphocytes Relative: 37 %
Lymphs Abs: 2.8 K/uL (ref 0.7–4.0)
MCH: 34.6 pg — ABNORMAL HIGH (ref 26.0–34.0)
MCHC: 31.7 g/dL (ref 30.0–36.0)
MCV: 109.4 fL — ABNORMAL HIGH (ref 80.0–100.0)
Monocytes Absolute: 1.1 K/uL — ABNORMAL HIGH (ref 0.1–1.0)
Monocytes Relative: 14 %
Neutro Abs: 3.6 K/uL (ref 1.7–7.7)
Neutrophils Relative %: 48 %
Platelets: 98 K/uL — ABNORMAL LOW (ref 150–400)
RBC: 2.54 MIL/uL — ABNORMAL LOW (ref 3.87–5.11)
RDW: 16.7 % — ABNORMAL HIGH (ref 11.5–15.5)
WBC: 7.5 K/uL (ref 4.0–10.5)
nRBC: 0 % (ref 0.0–0.2)

## 2024-06-10 LAB — BASIC METABOLIC PANEL WITH GFR
Anion gap: 4 — ABNORMAL LOW (ref 5–15)
BUN: 24 mg/dL — ABNORMAL HIGH (ref 8–23)
CO2: 27 mmol/L (ref 22–32)
Calcium: 7.4 mg/dL — ABNORMAL LOW (ref 8.9–10.3)
Chloride: 104 mmol/L (ref 98–111)
Creatinine, Ser: 0.62 mg/dL (ref 0.44–1.00)
GFR, Estimated: >60 mL/min (ref >60)
Glucose, Bld: 204 mg/dL — ABNORMAL HIGH (ref 70–99)
Potassium: 4.4 mmol/L (ref 3.5–5.1)
Sodium: 135 mmol/L (ref 135–145)

## 2024-06-10 LAB — GLUCOSE, CAPILLARY
Glucose-Capillary: 126 mg/dL — ABNORMAL HIGH (ref 70–99)
Glucose-Capillary: 161 mg/dL — ABNORMAL HIGH (ref 70–99)
Glucose-Capillary: 176 mg/dL — ABNORMAL HIGH (ref 70–99)
Glucose-Capillary: 197 mg/dL — ABNORMAL HIGH (ref 70–99)
Glucose-Capillary: 218 mg/dL — ABNORMAL HIGH (ref 70–99)

## 2024-06-10 MED ORDER — FAMOTIDINE 20 MG PO TABS
20.0000 mg | ORAL_TABLET | Freq: Two times a day (BID) | ORAL | Status: DC
  Administered 2024-06-10 – 2024-06-13 (×7): 20 mg
  Filled 2024-06-10 (×8): qty 1

## 2024-06-10 NOTE — Plan of Care (Signed)

## 2024-06-10 NOTE — Progress Notes (Signed)
 Daily Progress Note   Patient Name: Kathleen Jensen       Date: 06/10/2024 DOB: [birth year]  Age: 73 y.o. MRN#: [id number] Attending Physician: Sherlon Brayton RAMAN, MD Primary Care Physician: System, Provider Not In Admit Date: 06/04/2024 Length of Stay: 4 days  Reason for Consultation/Follow-up: Establishing goals of care  Subjective:   Subjective: EMR reviewed this morning including notes from hospitalist and transition of care team.  Labs reviewed reveal sodium 135, potassium 4.4, creatinine 0.62, hemoglobin 8.8, platelets 98.  No new imaging today.  I met today for a family meeting with myself, Fairy Kemps, NP, [name] Search (DSS legal guardian), and patient's 3 daughters.  We discussed the things most important to Ms. Scaglione.  Her daughters report that family is the most important thing to her.  She has 3 girls, 8 grandchildren, and 9 great-grandchildren.  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  We reviewed her clinical course beginning with CVA in 2018.  We also discussed changes in her nutrition, cognition, and functional status for the last several years.  Discussed in detail regarding acute decline over the past few months beginning with hospitalization in August/September of this year due to bacteremia related to pacemaker.  It was determined that due to her functional status, pacemaker cannot be removed and patient will be on suppressive antibiotic therapy long-term.  Discussed recent hospitalization at [facility] long for Pseudomonas bacteremia and recurrent admission for Pseudomonas bacteremia that is now resistant to ciprofloxacin, which she was treated with during last admission as susceptibility of that infection included ciprofloxacin.  We discussed clinical course as well as wishes moving forward in regard to advanced directives.  Concepts specific to code status and rehospitalization discussed.  We discussed  difference between a aggressive medical intervention path and a palliative, comfort focused care path.  Values and goals of care important to patient and family were attempted to be elicited.  In discussion with daughters and DSS legal guardian, recommendation was made for consideration for limitation of care of no chest compressions or intubation in the event of cardiac or respiratory arrest.  Her daughters agree that heroic interventions would not be in her best interest nor what she would desire at this point.  [name] relays that she does not feel that her mother would want to transition to a pure comfort mode at this point in time.  Discussed that changing CODE STATUS would be a separate decision than transitioning of care to a more comfort based approach.  Her daughters do report that they see a distinct difference in her now versus this summer and understand that she may not recover.  At the same time, discussed that she was recently transition to new antibiotic and they would like to see how she does for the next several days before making any further decisions.   Concept of Hospice and Palliative Care were discussed   Questions and concerns addressed.   PMT will continue to support holistically.   Review of Systems Unable to obtain  Objective:   Vital Signs:  BP 132/66 (BP Location: Right Arm)   Pulse 71   Temp 98.3 F (36.8 C) (Axillary)   Resp 20   Ht 5' 10 (1.778 m)   Wt 99 kg   SpO2 100%   BMI 31.32 kg/m   Physical Exam: General: Chronically ill-appearing black female with contracture right upper extremity and bilateral swelling Cardiovascular: Regular  rate and rhythm, no murmur appreciated Lungs: Diminished bilaterally Abdomen: Distended, PEG tube in place Extremities: +1 edema bilaterally in lower extremity Neuro: Tracks in room but unable to converse and does not follow commands.  Imaging: @IMAGES @  I personally reviewed recent imaging.   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  Recommendation for DNR/DNI with time limited trial of current therapies to see how she does over the next few days.  Family understand that she is critically ill and she may not be able to bounce back as she has in the past.  Family in agreement with this plan, but will require approval from DSS.    - Paperwork completed by myself and Dr. Royal with recommendation for no attempts at resuscitation in the event of natural death given to legal guardian to review with DSS leadership.  Await decision of DSS regarding DNR/DNI status.  - I will plan to f/u again early next week if she remains admitted.  Please call for acute palliative needs before 11/17.    -  Code Status: Full Code  Prognosis: Unable to determine  # Symptom management: Patient is receiving these palliative interventions for symptom management with an intent to improve quality of life.   - Pain: Acetaminophen  as needed  - Nausea: Zofran  as needed  # Psychosocial Support:  -- Support System[place]. She has 3 daughters who are involved and visit her regularly. - Desire for further Chaplain support:no  # Discharge Planning: To Be Determined  -  Discussed with: bedside staff  Thank you for allowing the palliative care team to participate in the care Yaris Trudy Collet.  Amaryllis Meissner, MD Palliative Care Provider PMT # [phone]  If patient remains symptomatic despite maximum doses, please call PMT at [phone] between 0700 and 1900.  Outside of these hours, please call attending, as PMT does not have night coverage.   I personally spent a total of 60 minutes in the care of the patient today including preparing to see the patient, getting/reviewing separately obtained history, performing a medically appropriate exam/evaluation, counseling and educating, referring and communicating with other health care professionals, documenting clinical information in the EHR, communicating results, and coordinating care.

## 2024-06-10 NOTE — Progress Notes (Signed)
 PROGRESS NOTE    Kathleen Jensen  FMW:[id number] DOB: 07/07/1951 DOA: 06/04/2024 PCP: System, Provider Not In     Chief Complaint  Patient presents with   Altered Mental Status    Brief Narrative:   73 y.o. 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   Significant events: -  8/8-->04/07/19/2025 patient for MRSA bacteremia/endocarditis, not a candidate for pacemaker explant, will need lifelong cefadroxil . -  10/22-11/3 admitted due to Pseudomonas UTI/Foley catheter  And feeding tube leakage CT showed jejunostomy in place adynamic ileus and treated for a UTI secondary to Pseudomonas palliative care was consulted and palliative/hospice was recommended as an outpatient  - 11/7 admitted for sepsis/encephalopathy .  Significant for Pseudomonas bacteremia Blood culture now growing Pseudomonas 1/3 bottle - 11/9 more hypotensive central line placed by CCM right IJ   Assessment & Plan:   Principal Problem:   Sepsis associated hypotension (HCC) Active Problems:   Seizures (HCC)   OBSTRUCTIVE SLEEP APNEA   Essential hypertension   History of expressive aphasia   Pulmonary embolism and infarction (HCC)   PAF (paroxysmal atrial fibrillation) (HCC)   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HCC)   Bedbound     Severe sepsis, POA due to Pseudomonas bacteremia -Recent hospitalization for Pseudomonas UTI. - Bently with 1 blood culture growing Pseudomonas bacteremia -Patient was discharged on Cipro, apparently it resistant to Cipro, continue with Zosyn. - ID input greatly appreciated, will need IV Zosyn for 7 to 10 days, and surveillance cultures in couple  weeks. Hypernatremia - Improved.  Continue free water  per tube   Hyponatremia - Mild.  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  Continue amiodarone  and metoprolol    Essential hypertension--continue metoprolol    Diabetes mellitus type 2  - Overall CBG is controlled, continue to monitor CBG.   GERD -Continue famotidine    Hypokalemia - Replace   Thrombocytopenia - Mild.  At baseline   Nutrition Dysphagia - Continue jejunostomy tube feeding as per dietitian recommendations.  Continue free water  per tube  Goals of care - Overall prognosis is poor.    Palliative care consult greatly appreciated, plan for DSS/family meeting today regarding goals of care.   Obesity class I - Outpatient follow-up  Sick sinus syndrome not a candidate for explantation - Is maintained on monitors as normal sinus rhythm for now  DVT prophylaxis: (Lovenox ) Code Status: (Full) Family Communication: ( none at bedside ) Disposition:   Status is: Inpatient    Consultants:  Palliative  ID   Subjective:  She is nonverbal, noncommunicative, no significant events overnight as discussed with staff.  Objective: Vitals:   06/10/24 0015 06/10/24 0400 06/10/24 0500 06/10/24 0700  BP:    118/69  Pulse: 91 71  Resp: 16 20    Temp:  98.2 F (36.8 C)  98.2 F (36.8 C)  TempSrc:  Axillary  Axillary  SpO2: 98% 100%    Weight:   99 kg   Height:         Intake/Output Summary (Last 24 hours) at 06/10/2024 1139 Last data filed at 06/10/2024 0600 Gross per 24 hour  Intake --  Output 800 ml  Net -800 ml   Filed Weights   06/08/24 0315 06/09/24 0500 06/10/24 0500  Weight: 105.7 kg 97 kg 99 kg    Examination:  Significantly debilitated, does not follow any commands, or answer any questions, does not open her eyes Right IJ TLC present, site looks clean Good air entry bilaterally Abdomen soft, PEG present Extremities with trace edema  Data Reviewed: I have personally reviewed following labs and imaging studies  CBC: Recent Labs  Lab 06/06/24 0241 06/07/24 0325 06/08/24 0328 06/09/24 0610 06/10/24 0238  WBC 10.5 9.1 8.3 8.6 7.5  NEUTROABS 4.5 5.1 4.7 5.0 3.6  HGB 10.3* 9.2* 10.1* 9.2* 8.8*  HCT 32.5* 28.3* 31.2* 28.7* 27.8*  MCV 109.1* 106.0* 107.6* 110.4* 109.4*  PLT 156 129* 124* 106* 98*    Basic Metabolic Panel: Recent Labs  Lab 06/05/24 0500 06/06/24 0241 06/07/24 0325 06/08/24 0328 06/09/24 0610 06/10/24 0238  NA 132* 131* 130* 133* 136 135  K 4.5 4.1 3.6 3.2* 4.1 4.4  CL 101 99 101 103 102 104  CO2 23 23 24 25 27 27   GLUCOSE 79 76 84 151* 168* 204*  BUN 18 15 10 9 23  24*  CREATININE 0.80 0.71 0.63 0.66 0.60 0.62  CALCIUM  7.6* 7.7* 7.4* 7.6* 7.4* 7.4*  MG 1.8 1.7 1.8 1.9 2.4  --   PHOS 3.1 3.7 2.5 2.0* 1.3*  --     GFR: Estimated Creatinine Clearance: 79.8 mL/min (by C-G formula based on SCr of 0.62 mg/dL).  Liver Function Tests: Recent Labs  Lab 06/04/24 1711 06/05/24 0500  AST 48* 41  ALT 14 15  ALKPHOS 183* 140*  BILITOT 0.9 0.9  PROT 8.8* 8.2*  ALBUMIN  <1.5* <1.5*    CBG: Recent Labs  Lab 06/09/24 1156 06/09/24 1532 06/09/24 2112 06/10/24 0029 06/10/24 0714  GLUCAP 154* 172* 166* 197* 161*     Recent Results (from the past 240 hours)  Resp panel by RT-PCR (RSV, Flu A&B, Covid)     Status: None   Collection Time: 06/04/24  4:50 PM   Specimen: Nasal Swab  Result Value  Ref Range Status   SARS Coronavirus 2 by RT PCR NEGATIVE NEGATIVE Final   Influenza A by PCR NEGATIVE NEGATIVE Final   Influenza B by PCR NEGATIVE NEGATIVE Final    Comment: (NOTE) The Xpert Xpress SARS-CoV-2/FLU/RSV plus assay is intended as an aid in the diagnosis of influenza from Nasopharyngeal swab specimens and should not be used as a sole basis for treatment. Nasal washings and aspirates are unacceptable for Xpert Xpress SARS-CoV-2/FLU/RSV testing.  Fact Sheet for Patients: bloggercourse.com  Fact Sheet for Healthcare Providers: seriousbroker.it  This test is not yet approved or cleared by the [place]  FDA and has been authorized for detection and/or diagnosis of SARS-CoV-2 by FDA under an Emergency Use Authorization (EUA). This EUA will remain in effect (meaning this test can be used) for the duration of the COVID-19 declaration under Section 564(b)(1) of the Act, 21 U.S.C. section 360bbb-3(b)(1), unless the authorization is terminated or revoked.     Resp Syncytial Virus  by PCR NEGATIVE NEGATIVE Final    Comment: (NOTE) Fact Sheet for Patients: bloggercourse.com  Fact Sheet for Healthcare Providers: seriousbroker.it  This test is not yet approved or cleared by the [place]  FDA and has been authorized for detection and/or diagnosis of SARS-CoV-2 by FDA under an Emergency Use Authorization (EUA). This EUA will remain in effect (meaning this test can be used) for the duration of the COVID-19 declaration under Section 564(b)(1) of the Act, 21 U.S.C. section 360bbb-3(b)(1), unless the authorization is terminated or revoked.  Performed at [facility], [address], [place], [place] [address]   Urine Culture     Status: None   Collection Time: 06/04/24  4:52 PM   Specimen: Urine, Catheterized  Result Value Ref Range Status   Specimen Description  URINE, CATHETERIZED  Final   Special Requests NONE  Final   Culture   Final    NO GROWTH Performed at [facility], [address], [place], [place] [address]    Report Status 06/05/2024 FINAL  Final  Blood Culture (routine x 2)     Status: None   Collection Time: 06/04/24  4:55 PM   Specimen: BLOOD RIGHT ARM  Result Value Ref Range Status   Specimen Description BLOOD RIGHT ARM  Final   Special Requests   Final    BOTTLES DRAWN AEROBIC ONLY Blood Culture results may not be optimal due to an inadequate volume of blood received in culture bottles   Culture   Final    NO GROWTH 5 DAYS Performed at [facility], [address], [place], [place] [address]    Report Status 06/09/2024 FINAL  Final  Blood Culture (routine x 2)     Status: Abnormal   Collection Time: 06/04/24  5:35 PM   Specimen: BLOOD LEFT HAND  Result Value Ref Range Status   Specimen Description BLOOD LEFT HAND  Final   Special Requests   Final    BOTTLES DRAWN AEROBIC AND ANAEROBIC Blood Culture results may not be optimal due to an inadequate volume of blood received in culture bottles   Culture  Setup Time   Final    GRAM NEGATIVE RODS AEROBIC BOTTLE ONLY RBV V  BRYKPHARMD  06/06/2024 @0617  BY DD Performed at [facility], [address], [place], [place] [address]    Culture PSEUDOMONAS AERUGINOSA (A)  Final   Report Status 06/08/2024 FINAL  Final   Organism ID, Bacteria PSEUDOMONAS AERUGINOSA  Final      Susceptibility   Pseudomonas aeruginosa - MIC*    MEROPENEM <=0.25 SENSITIVE Sensitive     CIPROFLOXACIN 2 RESISTANT Resistant     IMIPENEM 2 SENSITIVE Sensitive     PIP/TAZO Value in next row Sensitive      8 SENSITIVEThis is a modified FDA-approved test that has been validated and its performance characteristics determined by the reporting laboratory.  This laboratory is certified under the Clinical Laboratory Improvement Amendments CLIA as qualified to perform high complexity clinical  laboratory testing.    CEFTAZIDIME/AVIBACTAM Value in next row Sensitive      8 SENSITIVEThis is a modified FDA-approved test that has been validated and its performance characteristics determined by the reporting laboratory.  This laboratory is certified under the Clinical Laboratory Improvement Amendments CLIA as qualified to perform high complexity clinical laboratory testing.    CEFTOLOZANE/TAZOBACTAM Value in next row Sensitive      8 SENSITIVEThis is a modified FDA-approved test that has  been validated and its performance characteristics determined by the reporting laboratory.  This laboratory is certified under the Clinical Laboratory Improvement Amendments CLIA as qualified to perform high complexity clinical laboratory testing.    TOBRAMYCIN Value in next row Sensitive      8 SENSITIVEThis is a modified FDA-approved test that has been validated and its performance characteristics determined by the reporting laboratory.  This laboratory is certified under the Clinical Laboratory Improvement Amendments CLIA as qualified to perform high complexity clinical laboratory testing.    CEFTAZIDIME Value in next row Sensitive      8 SENSITIVEThis is a modified FDA-approved test that has been validated and its performance characteristics determined by the reporting laboratory.  This laboratory is certified under the Clinical Laboratory Improvement Amendments CLIA as qualified to perform high complexity clinical laboratory testing.    * PSEUDOMONAS AERUGINOSA  Blood Culture ID Panel (Reflexed)     Status: Abnormal   Collection Time: 06/04/24  5:35 PM  Result Value Ref Range Status   Enterococcus faecalis NOT DETECTED NOT DETECTED Final   Enterococcus Faecium NOT DETECTED NOT DETECTED Final   Listeria monocytogenes NOT DETECTED NOT DETECTED Final   Staphylococcus species NOT DETECTED NOT DETECTED Final   Staphylococcus aureus (BCID) NOT DETECTED NOT DETECTED Final   Staphylococcus epidermidis NOT  DETECTED NOT DETECTED Final   Staphylococcus lugdunensis NOT DETECTED NOT DETECTED Final   Streptococcus species NOT DETECTED NOT DETECTED Final   Streptococcus agalactiae NOT DETECTED NOT DETECTED Final   Streptococcus pneumoniae NOT DETECTED NOT DETECTED Final   Streptococcus pyogenes NOT DETECTED NOT DETECTED Final   A.calcoaceticus-baumannii NOT DETECTED NOT DETECTED Final   Bacteroides fragilis NOT DETECTED NOT DETECTED Final   Enterobacterales NOT DETECTED NOT DETECTED Final   Enterobacter cloacae complex NOT DETECTED NOT DETECTED Final   Escherichia coli NOT DETECTED NOT DETECTED Final   Klebsiella aerogenes NOT DETECTED NOT DETECTED Final   Klebsiella oxytoca NOT DETECTED NOT DETECTED Final   Klebsiella pneumoniae NOT DETECTED NOT DETECTED Final   Proteus species NOT DETECTED NOT DETECTED Final   Salmonella species NOT DETECTED NOT DETECTED Final   Serratia marcescens NOT DETECTED NOT DETECTED Final   Haemophilus influenzae NOT DETECTED NOT DETECTED Final   Neisseria meningitidis NOT DETECTED NOT DETECTED Final   Pseudomonas aeruginosa DETECTED (A) NOT DETECTED Final    Comment: CRITICAL RESULT CALLED TO, READ BACK BY AND VERIFIED WITH: RBV V  BRYKPHARMD  06/06/2024 @0617  BY DD    Stenotrophomonas maltophilia NOT DETECTED NOT DETECTED Final   Candida albicans NOT DETECTED NOT DETECTED Final   Candida auris NOT DETECTED NOT DETECTED Final   Candida glabrata NOT DETECTED NOT DETECTED Final   Candida krusei NOT DETECTED NOT DETECTED Final   Candida parapsilosis NOT DETECTED NOT DETECTED Final   Candida tropicalis NOT DETECTED NOT DETECTED Final   Cryptococcus neoformans/gattii NOT DETECTED NOT DETECTED Final   CTX-M ESBL NOT DETECTED NOT DETECTED Final   Carbapenem resistance IMP NOT DETECTED NOT DETECTED Final   Carbapenem resistance KPC NOT DETECTED NOT DETECTED Final   Carbapenem resistance NDM NOT DETECTED NOT DETECTED Final   Carbapenem resistance VIM NOT DETECTED NOT  DETECTED Final    Comment: Performed at [facility], [address], [place], [place] [address]  Respiratory (~20 pathogens) panel by PCR     Status: None   Collection Time: 06/05/24  2:30 AM   Specimen: Nasopharyngeal Swab; Respiratory  Result Value Ref Range Status   Adenovirus NOT  DETECTED NOT DETECTED Final   Coronavirus 229E NOT DETECTED NOT DETECTED Final    Comment: (NOTE) The Coronavirus on the Respiratory Panel, DOES NOT test for the novel  Coronavirus (2019 nCoV)    Coronavirus HKU1 NOT DETECTED NOT DETECTED Final   Coronavirus NL63 NOT DETECTED NOT DETECTED Final   Coronavirus OC43 NOT DETECTED NOT DETECTED Final   Metapneumovirus NOT DETECTED NOT DETECTED Final   Rhinovirus / Enterovirus NOT DETECTED NOT DETECTED Final   Influenza A NOT DETECTED NOT DETECTED Final   Influenza B NOT DETECTED NOT DETECTED Final   Parainfluenza Virus 1 NOT DETECTED NOT DETECTED Final   Parainfluenza Virus 2 NOT DETECTED NOT DETECTED Final   Parainfluenza Virus 3 NOT DETECTED NOT DETECTED Final   Parainfluenza Virus 4 NOT DETECTED NOT DETECTED Final   Respiratory Syncytial Virus NOT DETECTED NOT DETECTED Final   Bordetella pertussis NOT DETECTED NOT DETECTED Final   Bordetella Parapertussis NOT DETECTED NOT DETECTED Final   Chlamydophila pneumoniae NOT DETECTED NOT DETECTED Final   Mycoplasma pneumoniae NOT DETECTED NOT DETECTED Final    Comment: Performed at [facility], [address] [place], [place] [address]         Radiology Studies: No results found.      Scheduled Meds:  amiodarone   200 mg Per Tube Daily   Chlorhexidine  Gluconate Cloth  6 each Topical Daily   collagenase   Topical Daily   enoxaparin  (LOVENOX ) injection  40 mg Subcutaneous Q24H   estradiol  1 Applicatorful Vaginal Once per day on Tuesday Friday   famotidine   20 mg Per Tube BID   feeding supplement (PROSource TF20)  60 mL Per Tube Daily   furosemide   20 mg Oral Daily   Gerhardt's butt  cream   Topical TID   levETIRAcetam   1,000 mg Per Tube BID   nutrition supplement (JUVEN)  1 packet Per Tube BID BM   potassium chloride   20 mEq Oral Daily   simethicone   80 mg Per Tube TID   thiamine   100 mg Per Tube Daily   valproic  acid  500 mg Per Tube TID   valproic  acid  750 mg Per Tube QHS   Continuous Infusions:  feeding supplement (OSMOLITE 1.2 CAL) 60 mL/hr at 06/09/24 0000   piperacillin-tazobactam (ZOSYN)  IV 3.375 g (06/10/24 0558)     LOS: 4 days      Brayton Lye, MD [facility]   To contact the attending provider between 7A-7P or the covering provider during after hours 7P-7A, please log into the web site [email or web address] and access using universal [place] password for that web site. If you do not have the password, please call the hospital operator.  06/10/2024, 11:39 AM

## 2024-06-11 DIAGNOSIS — A419 Sepsis, unspecified organism: Secondary | ICD-10-CM | POA: Diagnosis not present

## 2024-06-11 DIAGNOSIS — I959 Hypotension, unspecified: Secondary | ICD-10-CM | POA: Diagnosis not present

## 2024-06-11 LAB — CBC WITH DIFFERENTIAL/PLATELET
Abs Immature Granulocytes: 0.07 K/uL (ref 0.00–0.07)
Basophils Absolute: 0 K/uL (ref 0.0–0.1)
Basophils Relative: 0 %
Eosinophils Absolute: 0 K/uL (ref 0.0–0.5)
Eosinophils Relative: 1 %
HCT: 27.8 % — ABNORMAL LOW (ref 36.0–46.0)
Hemoglobin: 8.7 g/dL — ABNORMAL LOW (ref 12.0–15.0)
Immature Granulocytes: 1 %
Lymphocytes Relative: 36 %
Lymphs Abs: 2.8 K/uL (ref 0.7–4.0)
MCH: 34.7 pg — ABNORMAL HIGH (ref 26.0–34.0)
MCHC: 31.3 g/dL (ref 30.0–36.0)
MCV: 110.8 fL — ABNORMAL HIGH (ref 80.0–100.0)
Monocytes Absolute: 1.2 K/uL — ABNORMAL HIGH (ref 0.1–1.0)
Monocytes Relative: 15 %
Neutro Abs: 3.8 K/uL (ref 1.7–7.7)
Neutrophils Relative %: 47 %
Platelets: 90 K/uL — ABNORMAL LOW (ref 150–400)
RBC: 2.51 MIL/uL — ABNORMAL LOW (ref 3.87–5.11)
RDW: 16.7 % — ABNORMAL HIGH (ref 11.5–15.5)
Smear Review: NORMAL
WBC: 8 K/uL (ref 4.0–10.5)
nRBC: 0 % (ref 0.0–0.2)

## 2024-06-11 LAB — GLUCOSE, CAPILLARY
Glucose-Capillary: 181 mg/dL — ABNORMAL HIGH (ref 70–99)
Glucose-Capillary: 147 mg/dL — ABNORMAL HIGH (ref 70–99)
Glucose-Capillary: 168 mg/dL — ABNORMAL HIGH (ref 70–99)
Glucose-Capillary: 225 mg/dL — ABNORMAL HIGH (ref 70–99)
Glucose-Capillary: 99 mg/dL (ref 70–99)
Glucose-Capillary: 131 mg/dL — ABNORMAL HIGH (ref 70–99)

## 2024-06-11 LAB — BASIC METABOLIC PANEL WITH GFR
Anion gap: 6 (ref 5–15)
BUN: 25 mg/dL — ABNORMAL HIGH (ref 8–23)
CO2: 28 mmol/L (ref 22–32)
Calcium: 7.5 mg/dL — ABNORMAL LOW (ref 8.9–10.3)
Chloride: 104 mmol/L (ref 98–111)
Creatinine, Ser: 0.72 mg/dL (ref 0.44–1.00)
GFR, Estimated: >60 mL/min (ref >60)
Glucose, Bld: 181 mg/dL — ABNORMAL HIGH (ref 70–99)
Potassium: 4.8 mmol/L (ref 3.5–5.1)
Sodium: 138 mmol/L (ref 135–145)

## 2024-06-11 NOTE — Progress Notes (Addendum)
 PROGRESS NOTE    Kathleen Jensen  FMW:[id number] DOB: [birth year] DOA: 06/04/2024 PCP: System, Provider Not In     Chief Complaint  Patient presents with   Altered Mental Status    Brief Narrative:   73 y.o. 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   Significant events: -  8/8-->04/07/19/2025 patient for MRSA bacteremia/endocarditis, not a candidate for pacemaker explant, will need lifelong cefadroxil . -  10/22-11/3 admitted due to Pseudomonas UTI/Foley catheter  And feeding tube leakage CT showed jejunostomy in place adynamic ileus and treated for a UTI secondary to Pseudomonas palliative care was consulted and palliative/hospice was recommended as an outpatient  - 11/7 admitted for sepsis/encephalopathy .  Significant for Pseudomonas bacteremia Blood culture now growing Pseudomonas 1/3 bottle - 11/9 more hypotensive central line placed by CCM right IJ   Assessment & Plan:   Principal Problem:   Sepsis associated hypotension (HCC) Active Problems:   Seizures (HCC)   OBSTRUCTIVE SLEEP APNEA   Essential hypertension   History of expressive aphasia   Pulmonary embolism and infarction (HCC)   PAF (paroxysmal atrial fibrillation) (HCC)   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HCC)   Bedbound     Severe sepsis, POA due to Pseudomonas bacteremia -Recent hospitalization for Pseudomonas UTI. - Bently with 1 blood culture growing Pseudomonas bacteremia -Patient was discharged on Cipro, apparently it resistant to Cipro, continue with Zosyn. - ID input greatly appreciated, will need IV Zosyn for 7 to 10 days, today is day #6, will continue for  another 3 days.  To finish total of 10 days, will need surveillance blood cultures in 2 weeks   Hypernatremia - Improved.  Continue free water  per tube   Hyponatremia - Mild.  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  Continue amiodarone  and metoprolol    Essential hypertension--continue metoprolol    Diabetes mellitus type 2  - Overall CBG is controlled, continue to monitor CBG.   GERD -Continue famotidine    Hypokalemia - Replace   Thrombocytopenia - Mild.  At baseline   Nutrition Dysphagia - Continue jejunostomy tube feeding as per dietitian recommendations.  Continue free water  per tube  Goals of care - Overall prognosis is poor.    Palliative care consult greatly appreciated,  DSS/family meeting with palliative medicine on 06/10/2024, continue with current measures for now, and to readdress goals of care next week.     Obesity class I - Outpatient follow-up  Sick sinus syndrome not a candidate for explantation - Is maintained on monitors as normal sinus rhythm for now  DVT prophylaxis: (Lovenox ) Code Status: (Full) Family Communication: ( none at bedside ) Disposition:   Status is: Inpatient    Consultants:  Palliative  ID   Subjective:  She  is nonverbal, noncommunicative, Tmax 100.8  Objective: Vitals:   06/11/24 0445 06/11/24 0500 06/11/24 0747 06/11/24 1206  BP: 134/80  128/74 129/79  Pulse: 77     Resp: (!) 22  19  20   Temp: 100.3 F (37.9 C)  100.1 F (37.8 C) (!) 100.8 F (38.2 C)  TempSrc: Axillary  Axillary Axillary  SpO2: 100%     Weight:  100 kg    Height:        Intake/Output Summary (Last 24 hours) at 06/11/2024 1505 Last data filed at 06/11/2024 0533 Gross per 24 hour  Intake 120 ml  Output 1300 ml  Net -1180 ml   Filed Weights   06/09/24 0500 06/10/24 0500 06/11/24 0500  Weight: 97 kg 99 kg 100 kg    Examination:  Significantly debilitated, does not follow any commands, or answer any questions, does not open her eyes Right IJ TLC present, site looks clean Order entry Abdomen soft,  extremities with no edema  Data Reviewed: I have personally reviewed following labs and imaging studies  CBC: Recent Labs  Lab 06/07/24 0325 06/08/24 0328 06/09/24 0610 06/10/24 0238 06/11/24 0310  WBC 9.1 8.3 8.6 7.5 8.0  NEUTROABS 5.1 4.7 5.0 3.6 3.8  HGB 9.2* 10.1* 9.2* 8.8* 8.7*  HCT 28.3* 31.2* 28.7* 27.8* 27.8*  MCV 106.0* 107.6* 110.4* 109.4* 110.8*  PLT 129* 124* 106* 98* 90*    Basic Metabolic Panel: Recent Labs  Lab 06/05/24 0500 06/06/24 0241 06/07/24 0325 06/08/24 0328 06/09/24 0610 06/10/24 0238 06/11/24 0310  NA 132* 131* 130* 133* 136 135 138  K 4.5 4.1 3.6 3.2* 4.1 4.4 4.8  CL 101 99 101 103 102 104 104  CO2 23 23 24 25 27 27 28   GLUCOSE 79 76 84 151* 168* 204* 181*  BUN 18 15 10 9 23  24* 25*  CREATININE 0.80 0.71 0.63 0.66 0.60 0.62 0.72  CALCIUM  7.6* 7.7* 7.4* 7.6* 7.4* 7.4* 7.5*  MG 1.8 1.7 1.8 1.9 2.4  --   --   PHOS 3.1 3.7 2.5 2.0* 1.3*  --   --     GFR: Estimated Creatinine Clearance: 80.2 mL/min (by C-G formula based on SCr of 0.72 mg/dL).  Liver Function Tests: Recent Labs  Lab 06/04/24 1711 06/05/24 0500  AST 48* 41  ALT 14 15  ALKPHOS 183* 140*  BILITOT 0.9 0.9  PROT 8.8* 8.2*  ALBUMIN  <1.5* <1.5*    CBG: Recent Labs  Lab 06/10/24 2128 06/11/24 0103 06/11/24 0454 06/11/24 0754 06/11/24 1211  GLUCAP 176* 181* 147* 168*  225*     Recent Results (from the past 240 hours)  Resp panel by RT-PCR (RSV, Flu A&B, Covid)     Status: None   Collection Time: 06/04/24  4:50 PM   Specimen: Nasal Swab  Result Value Ref Range Status   SARS Coronavirus 2 by RT PCR NEGATIVE NEGATIVE Final   Influenza A by PCR NEGATIVE NEGATIVE Final   Influenza B by PCR NEGATIVE NEGATIVE Final    Comment: (NOTE) The Xpert Xpress SARS-CoV-2/FLU/RSV plus assay is intended as an aid in the diagnosis of influenza from Nasopharyngeal swab specimens and should not be used as a sole basis for treatment. Nasal washings and aspirates are unacceptable for Xpert Xpress SARS-CoV-2/FLU/RSV testing.  Fact Sheet for Patients: bloggercourse.com  Fact Sheet for Healthcare Providers: seriousbroker.it  This test is not yet approved or cleared by the [place]  FDA and has been authorized for detection and/or diagnosis of SARS-CoV-2 by  FDA under an Emergency Use Authorization (EUA). This EUA will remain in effect (meaning this test can be used) for the duration of the COVID-19 declaration under Section 564(b)(1) of the Act, 21 U.S.C. section 360bbb-3(b)(1), unless the authorization is terminated or revoked.     Resp Syncytial Virus by PCR NEGATIVE NEGATIVE Final    Comment: (NOTE) Fact Sheet for Patients: bloggercourse.com  Fact Sheet for Healthcare Providers: seriousbroker.it  This test is not yet approved or cleared by the [place]  FDA and has been authorized for detection and/or diagnosis of SARS-CoV-2 by FDA under an Emergency Use Authorization (EUA). This EUA will remain in effect (meaning this test can be used) for the duration of the COVID-19 declaration under Section 564(b)(1) of the Act, 21 U.S.C. section 360bbb-3(b)(1), unless the authorization is terminated or revoked.  Performed at [facility], [address] [place], [place] [address]   Urine Culture     Status: None   Collection Time: 06/04/24  4:52 PM   Specimen: Urine, Catheterized  Result Value Ref Range Status   Specimen Description URINE, CATHETERIZED  Final   Special Requests NONE  Final   Culture   Final    NO GROWTH Performed at [facility], [address], [place], [place] [address]    Report Status 06/05/2024 FINAL  Final  Blood Culture (routine x 2)     Status: None   Collection Time: 06/04/24  4:55 PM   Specimen: BLOOD RIGHT ARM  Result Value Ref Range Status   Specimen Description BLOOD RIGHT ARM  Final   Special Requests   Final    BOTTLES DRAWN AEROBIC ONLY Blood Culture results may not be optimal due to an inadequate volume of blood received in culture bottles   Culture   Final    NO GROWTH 5 DAYS Performed at [facility], [address], [place], [place] [address]    Report Status 06/09/2024 FINAL  Final  Blood Culture (routine x 2)     Status: Abnormal   Collection Time: 06/04/24  5:35 PM   Specimen: BLOOD LEFT HAND  Result Value Ref Range Status   Specimen Description BLOOD LEFT HAND  Final   Special Requests   Final    BOTTLES DRAWN AEROBIC AND ANAEROBIC Blood Culture results may not be optimal due to an inadequate volume of blood received in culture bottles   Culture  Setup Time   Final    GRAM NEGATIVE RODS AEROBIC BOTTLE ONLY RBV V  BRYKPHARMD  06/06/2024 @0617  BY DD Performed at [facility], [address] [place], [place] [address]    Culture PSEUDOMONAS AERUGINOSA (A)  Final   Report Status 06/08/2024 FINAL  Final   Organism ID, Bacteria PSEUDOMONAS AERUGINOSA  Final      Susceptibility   Pseudomonas aeruginosa - MIC*    MEROPENEM <=0.25 SENSITIVE Sensitive     CIPROFLOXACIN 2 RESISTANT Resistant     IMIPENEM 2 SENSITIVE Sensitive     PIP/TAZO Value in next row Sensitive      8 SENSITIVEThis is a modified FDA-approved test that has been validated and its performance  characteristics determined by the reporting laboratory.  This laboratory is certified under the Clinical Laboratory Improvement Amendments CLIA as qualified to perform high complexity clinical laboratory testing.    CEFTAZIDIME/AVIBACTAM Value in next row Sensitive      8 SENSITIVEThis is a modified FDA-approved test that has been validated and its performance  characteristics determined by the reporting laboratory.  This laboratory is certified under the Clinical Laboratory Improvement Amendments CLIA as qualified to perform high complexity clinical laboratory testing.    CEFTOLOZANE/TAZOBACTAM Value in next row Sensitive      8 SENSITIVEThis is a modified FDA-approved test that has been validated and its performance characteristics determined by the reporting laboratory.  This laboratory is certified under the Clinical Laboratory Improvement Amendments CLIA as qualified to perform high complexity clinical laboratory testing.    TOBRAMYCIN Value in next row Sensitive      8 SENSITIVEThis is a modified FDA-approved test that has been validated and its performance characteristics determined by the reporting laboratory.  This laboratory is certified under the Clinical Laboratory Improvement Amendments CLIA as qualified to perform high complexity clinical laboratory testing.    CEFTAZIDIME Value in next row Sensitive      8 SENSITIVEThis is a modified FDA-approved test that has been validated and its performance characteristics determined by the reporting laboratory.  This laboratory is certified under the Clinical Laboratory Improvement Amendments CLIA as qualified to perform high complexity clinical laboratory testing.    * PSEUDOMONAS AERUGINOSA  Blood Culture ID Panel (Reflexed)     Status: Abnormal   Collection Time: 06/04/24  5:35 PM  Result Value Ref Range Status   Enterococcus faecalis NOT DETECTED NOT DETECTED Final   Enterococcus Faecium NOT DETECTED NOT DETECTED Final   Listeria monocytogenes  NOT DETECTED NOT DETECTED Final   Staphylococcus species NOT DETECTED NOT DETECTED Final   Staphylococcus aureus (BCID) NOT DETECTED NOT DETECTED Final   Staphylococcus epidermidis NOT DETECTED NOT DETECTED Final   Staphylococcus lugdunensis NOT DETECTED NOT DETECTED Final   Streptococcus species NOT DETECTED NOT DETECTED Final   Streptococcus agalactiae NOT DETECTED NOT DETECTED Final   Streptococcus pneumoniae NOT DETECTED NOT DETECTED Final   Streptococcus pyogenes NOT DETECTED NOT DETECTED Final   A.calcoaceticus-baumannii NOT DETECTED NOT DETECTED Final   Bacteroides fragilis NOT DETECTED NOT DETECTED Final   Enterobacterales NOT DETECTED NOT DETECTED Final   Enterobacter cloacae complex NOT DETECTED NOT DETECTED Final   Escherichia coli NOT DETECTED NOT DETECTED Final   Klebsiella aerogenes NOT DETECTED NOT DETECTED Final   Klebsiella oxytoca NOT DETECTED NOT DETECTED Final   Klebsiella pneumoniae NOT DETECTED NOT DETECTED Final   Proteus species NOT DETECTED NOT DETECTED Final   Salmonella species NOT DETECTED NOT DETECTED Final   Serratia marcescens NOT DETECTED NOT DETECTED Final   Haemophilus influenzae NOT DETECTED NOT DETECTED Final   Neisseria meningitidis NOT DETECTED NOT DETECTED Final   Pseudomonas aeruginosa DETECTED (A) NOT DETECTED Final    Comment: CRITICAL RESULT CALLED TO, READ BACK BY AND VERIFIED WITH: RBV V  BRYKPHARMD  06/06/2024 @0617  BY DD    Stenotrophomonas maltophilia NOT DETECTED NOT DETECTED Final   Candida albicans NOT DETECTED NOT DETECTED Final   Candida auris NOT DETECTED NOT DETECTED Final   Candida glabrata NOT DETECTED NOT DETECTED Final   Candida krusei NOT DETECTED NOT DETECTED Final   Candida parapsilosis NOT DETECTED NOT DETECTED Final   Candida tropicalis NOT DETECTED NOT DETECTED Final   Cryptococcus neoformans/gattii NOT DETECTED NOT DETECTED Final   CTX-M ESBL NOT DETECTED NOT DETECTED Final   Carbapenem resistance IMP NOT DETECTED NOT  DETECTED Final   Carbapenem resistance KPC NOT DETECTED NOT DETECTED Final   Carbapenem resistance NDM NOT DETECTED NOT DETECTED Final   Carbapenem resistance VIM NOT DETECTED NOT DETECTED Final    Comment:  Performed at [facility], [address], [place], [place] [address]  Respiratory (~20 pathogens) panel by PCR     Status: None   Collection Time: 06/05/24  2:30 AM   Specimen: Nasopharyngeal Swab; Respiratory  Result Value Ref Range Status   Adenovirus NOT DETECTED NOT DETECTED Final   Coronavirus 229E NOT DETECTED NOT DETECTED Final    Comment: (NOTE) The Coronavirus on the Respiratory Panel, DOES NOT test for the novel  Coronavirus (2019 nCoV)    Coronavirus HKU1 NOT DETECTED NOT DETECTED Final   Coronavirus NL63 NOT DETECTED NOT DETECTED Final   Coronavirus OC43 NOT DETECTED NOT DETECTED Final   Metapneumovirus NOT DETECTED NOT DETECTED Final   Rhinovirus / Enterovirus NOT DETECTED NOT DETECTED Final   Influenza A NOT DETECTED NOT DETECTED Final   Influenza B NOT DETECTED NOT DETECTED Final   Parainfluenza Virus 1 NOT DETECTED NOT DETECTED Final   Parainfluenza Virus 2 NOT DETECTED NOT DETECTED Final   Parainfluenza Virus 3 NOT DETECTED NOT DETECTED Final   Parainfluenza Virus 4 NOT DETECTED NOT DETECTED Final   Respiratory Syncytial Virus NOT DETECTED NOT DETECTED Final   Bordetella pertussis NOT DETECTED NOT DETECTED Final   Bordetella Parapertussis NOT DETECTED NOT DETECTED Final   Chlamydophila pneumoniae NOT DETECTED NOT DETECTED Final   Mycoplasma pneumoniae NOT DETECTED NOT DETECTED Final    Comment: Performed at [facility], [address], [place], [place] [address]         Radiology Studies: No results found.      Scheduled Meds:  amiodarone   200 mg Per Tube Daily   Chlorhexidine  Gluconate Cloth  6 each Topical Daily   collagenase   Topical Daily   enoxaparin  (LOVENOX ) injection  40 mg Subcutaneous Q24H   estradiol  1 Applicatorful  Vaginal Once per day on Tuesday Friday   famotidine   20 mg Per Tube BID   feeding supplement (PROSource TF20)  60 mL Per Tube Daily   furosemide   20 mg Oral Daily   Gerhardt's butt cream   Topical TID   levETIRAcetam   1,000 mg Per Tube BID   nutrition supplement (JUVEN)  1 packet Per Tube BID BM   potassium chloride   20 mEq Oral Daily   simethicone   80 mg Per Tube TID   valproic  acid  500 mg Per Tube TID   valproic  acid  750 mg Per Tube QHS   Continuous Infusions:  feeding supplement (OSMOLITE 1.2 CAL) 60 mL/hr at 06/09/24 0000   piperacillin-tazobactam (ZOSYN)  IV 3.375 g (06/11/24 1015)     LOS: 5 days      Brayton Lye, MD [facility]   To contact the attending provider between 7A-7P or the covering provider during after hours 7P-7A, please log into the web site [email or web address] and access using universal [place] password for that web site. If you do not have the password, please call the hospital operator.  06/11/2024, 3:05 PM

## 2024-06-11 NOTE — TOC Progression Note (Signed)
 Transition of Care [facility]) - Progression Note    Patient Details  Name: Kathleen Jensen MRN: [id number] Date of Birth: 18-Dec-1950  Transition [facility]) CM/SW Contact  Inocente GORMAN Kindle, LCSW Phone Number: 06/11/2024, 11:38 AM  Clinical Narrative:    CSW updated [place] that patient is approaching medical stability.    Expected Discharge Plan: Long Term Nursing Home Barriers to Discharge: Continued Medical Work up               Expected Discharge Plan and Services In-house Referral: Clinical Social Work   Post Acute Care Choice: Skilled Nursing Facility Living arrangements for the past 2 months: Skilled Nursing Facility                                       Social Drivers of Health (SDOH) Interventions SDOH Screenings   Food Insecurity: Patient Unable To Answer (05/20/2024)  Housing: Patient Unable To Answer (05/20/2024)  Transportation Needs: Patient Unable To Answer (03/25/2024)  Utilities: Patient Unable To Answer (03/25/2024)  Depression (PHQ2-9): Low Risk  (05/22/2019)  Social Connections: Unknown (03/25/2024)  Tobacco Use: Medium Risk (05/19/2024)    Readmission Risk Interventions    05/31/2024   12:39 PM 05/21/2024    1:03 PM 02/18/2022    2:23 PM  Readmission Risk Prevention Plan  Transportation Screening Complete Complete Complete  PCP or Specialist Appt within 5-7 Days   Complete  Home Care Screening   Complete  Medication Review (RN CM)   Complete  Medication Review (RN Care Manager) Complete Complete   PCP or Specialist appointment within 3-5 days of discharge Complete Complete   HRI or Home Care Consult Complete Complete   SW Recovery Care/Counseling Consult Complete Complete   Palliative Care Screening Complete Complete   Skilled Nursing Facility Not Applicable Not Applicable

## 2024-06-11 NOTE — Plan of Care (Signed)
  Problem: Clinical Measurements: Goal: Ability to maintain clinical measurements within normal limits will improve Outcome: Not Progressing   Problem: Clinical Measurements: Goal: Will remain free from infection Outcome: Not Progressing   Problem: Clinical Measurements: Goal: Cardiovascular complication will be avoided Outcome: Not Progressing   Problem: Skin Integrity: Goal: Risk for impaired skin integrity will decrease Outcome: Not Progressing

## 2024-06-12 DIAGNOSIS — I959 Hypotension, unspecified: Secondary | ICD-10-CM | POA: Diagnosis not present

## 2024-06-12 DIAGNOSIS — A419 Sepsis, unspecified organism: Secondary | ICD-10-CM | POA: Diagnosis not present

## 2024-06-12 LAB — GLUCOSE, CAPILLARY
Glucose-Capillary: 123 mg/dL — ABNORMAL HIGH (ref 70–99)
Glucose-Capillary: 123 mg/dL — ABNORMAL HIGH (ref 70–99)
Glucose-Capillary: 129 mg/dL — ABNORMAL HIGH (ref 70–99)
Glucose-Capillary: 164 mg/dL — ABNORMAL HIGH (ref 70–99)
Glucose-Capillary: 169 mg/dL — ABNORMAL HIGH (ref 70–99)
Glucose-Capillary: 177 mg/dL — ABNORMAL HIGH (ref 70–99)
Glucose-Capillary: 207 mg/dL — ABNORMAL HIGH (ref 70–99)

## 2024-06-12 LAB — BASIC METABOLIC PANEL WITH GFR
Anion gap: 7 (ref 5–15)
BUN: 25 mg/dL — ABNORMAL HIGH (ref 8–23)
CO2: 27 mmol/L (ref 22–32)
Calcium: 7.8 mg/dL — ABNORMAL LOW (ref 8.9–10.3)
Chloride: 102 mmol/L (ref 98–111)
Creatinine, Ser: 0.65 mg/dL (ref 0.44–1.00)
GFR, Estimated: >60 mL/min (ref >60)
Glucose, Bld: 148 mg/dL — ABNORMAL HIGH (ref 70–99)
Potassium: 4.5 mmol/L (ref 3.5–5.1)
Sodium: 136 mmol/L (ref 135–145)

## 2024-06-12 LAB — CBC WITH DIFFERENTIAL/PLATELET
Abs Immature Granulocytes: 0.06 K/uL (ref 0.00–0.07)
Basophils Absolute: 0 K/uL (ref 0.0–0.1)
Basophils Relative: 0 %
Eosinophils Absolute: 0 K/uL (ref 0.0–0.5)
Eosinophils Relative: 1 %
HCT: 32.8 % — ABNORMAL LOW (ref 36.0–46.0)
Hemoglobin: 10.4 g/dL — ABNORMAL LOW (ref 12.0–15.0)
Immature Granulocytes: 1 %
Lymphocytes Relative: 35 %
Lymphs Abs: 2.4 K/uL (ref 0.7–4.0)
MCH: 34.7 pg — ABNORMAL HIGH (ref 26.0–34.0)
MCHC: 31.7 g/dL (ref 30.0–36.0)
MCV: 109.3 fL — ABNORMAL HIGH (ref 80.0–100.0)
Monocytes Absolute: 0.8 K/uL (ref 0.1–1.0)
Monocytes Relative: 12 %
Neutro Abs: 3.5 K/uL (ref 1.7–7.7)
Neutrophils Relative %: 51 %
Platelets: 89 K/uL — ABNORMAL LOW (ref 150–400)
RBC: 3 MIL/uL — ABNORMAL LOW (ref 3.87–5.11)
RDW: 16.2 % — ABNORMAL HIGH (ref 11.5–15.5)
WBC: 6.8 K/uL (ref 4.0–10.5)
nRBC: 0.3 % — ABNORMAL HIGH (ref 0.0–0.2)

## 2024-06-12 MED ORDER — INSULIN ASPART 100 UNIT/ML IJ SOLN
0.0000 [IU] | INTRAMUSCULAR | Status: DC
  Administered 2024-06-12: 2 [IU] via SUBCUTANEOUS
  Administered 2024-06-12: 3 [IU] via SUBCUTANEOUS
  Administered 2024-06-12: 2 [IU] via SUBCUTANEOUS
  Administered 2024-06-13 (×2): 1 [IU] via SUBCUTANEOUS
  Administered 2024-06-14 (×2): 2 [IU] via SUBCUTANEOUS
  Administered 2024-06-15: 1 [IU] via SUBCUTANEOUS
  Administered 2024-06-15 (×2): 2 [IU] via SUBCUTANEOUS
  Filled 2024-06-12: qty 3
  Filled 2024-06-12: qty 2
  Filled 2024-06-12: qty 1
  Filled 2024-06-12 (×5): qty 2

## 2024-06-12 NOTE — Plan of Care (Signed)
  Problem: Clinical Measurements: Goal: Ability to maintain clinical measurements within normal limits will improve Outcome: Progressing Goal: Will remain free from infection Outcome: Progressing Goal: Respiratory complications will improve Outcome: Progressing   Problem: Elimination: Goal: Will not experience complications related to bowel motility Outcome: Progressing Goal: Will not experience complications related to urinary retention Outcome: Progressing   Problem: Skin Integrity: Goal: Risk for impaired skin integrity will decrease Outcome: Progressing

## 2024-06-12 NOTE — Plan of Care (Signed)
  Problem: Education: Goal: Knowledge of General Education information will improve Description: Including pain rating scale, medication(s)/side effects and non-pharmacologic comfort measures Outcome: Not Progressing   Problem: Activity: Goal: Risk for activity intolerance will decrease Outcome: Not Progressing   Problem: Nutrition: Goal: Adequate nutrition will be maintained Outcome: Progressing   Problem: Safety: Goal: Ability to remain free from injury will improve Outcome: Progressing   Problem: Skin Integrity: Goal: Risk for impaired skin integrity will decrease Outcome: Progressing

## 2024-06-12 NOTE — Plan of Care (Signed)
   Medical records reviewed. Febrile at 100.6 this morning, vitals signs and labs stable otherwise. Patient continues to require IV Zosyn for Pseudomonas bacteremia. Plan is to continue time limited trial of current therapies to see how she does then revisit GOC discussions next week.  PMT will continue to follow peripherally.   Thank you for your referral and allowing PMT to assist in Kathleen Jensen's care.   Keona Sheffler, PA-C Palliative Medicine Team  Team Phone # [phone]   NO CHARGE

## 2024-06-12 NOTE — Progress Notes (Signed)
 PROGRESS NOTE    Kathleen Jensen  FMW:[id number] DOB: March 15, 1951 DOA: 06/04/2024 PCP: System, Provider Not In     Chief Complaint  Patient presents with   Altered Mental Status    Brief Narrative:   73 y.o. 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   Significant events: -  8/8-->04/07/19/2025 patient for MRSA bacteremia/endocarditis, not a candidate for pacemaker explant, will need lifelong cefadroxil . -  10/22-11/3 admitted due to Pseudomonas UTI/Foley catheter  And feeding tube leakage CT showed jejunostomy in place adynamic ileus and treated for a UTI secondary to Pseudomonas palliative care was consulted and palliative/hospice was recommended as an outpatient  - 11/7 admitted for sepsis/encephalopathy .  Significant for Pseudomonas bacteremia Blood culture now growing Pseudomonas 1/3 bottle - 11/9 more hypotensive central line placed by CCM right IJ   Assessment & Plan:   Principal Problem:   Sepsis associated hypotension (HCC) Active Problems:   Seizures (HCC)   OBSTRUCTIVE SLEEP APNEA   Essential hypertension   History of expressive aphasia   Pulmonary embolism and infarction (HCC)   PAF (paroxysmal atrial fibrillation) (HCC)   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HCC)   Bedbound     Severe sepsis, POA due to Pseudomonas bacteremia -Recent hospitalization for Pseudomonas UTI. - Bently with 1 blood culture growing Pseudomonas bacteremia -Patient was discharged on Cipro, apparently it resistant to Cipro, continue with Zosyn. - ID input greatly appreciated, will need IV Zosyn for 7 to 10 days, today is day #6, will continue for  another 3 days.  To finish total of 10 days, will need surveillance blood cultures in 2 weeks  - Patient keeps spiking intermittent fever despite appropriate antibiotic treatment, only possible access at this point is her TLC, cannot replace PICC line or peripheral IV due to noncompressible veins.  Hypernatremia - Improved.  Continue free water  per tube   Hyponatremia - Mild.  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  Continue amiodarone  and metoprolol    Essential hypertension--continue metoprolol    Diabetes mellitus type 2  - Overall CBG is controlled, continue to monitor CBG.   GERD -Continue famotidine    Hypokalemia - Replace   Thrombocytopenia - Mild.  At baseline  Difficult IV access - Unfortunately she is having noncompressible peripheral veins, so PIV or PICC line is not an option, so we have to continue with TLC for now (IR PICC line will not be any different as it is still a central access)   Nutrition Dysphagia - Continue jejunostomy tube feeding as per dietitian recommendations.  Continue free water  per tube   Obesity class I - Outpatient follow-up  Sick sinus syndrome not a candidate for explantation - Is maintained on monitors as normal sinus rhythm for  now  Goals of care - Overall prognosis is poor.    Palliative care consult greatly appreciated,  DSS/family meeting with palliative medicine on 06/10/2024, continue with  current measures for now, and to readdress goals of care next week.   - But overall prognosis appears to be very poor, with recurrent infections, for which source cannot be totally controlled, due to chronic indwelling Foley catheter, and presence of AICD, with high risk of clearing her bacteremia and infections keep rehab and especially with difficult access at this point.  DVT prophylaxis: (Lovenox ) Code Status: (Full) Family Communication: ( none at bedside ) Disposition:   Status is: Inpatient    Consultants:  Palliative  ID   Subjective:  She is nonverbal, noncommunicative, Tmax 100.6  Objective: Vitals:   06/12/24 0800 06/12/24 0900 06/12/24 1000 06/12/24 1100  BP: 134/77  (!) 141/78 132/63  Pulse: 84 80 86 85  Resp: (!) 28 (!) 26 (!) 23 17  Temp: (!) 100.6 F (38.1 C)  98.9 F (37.2 C) 98.3 F (36.8 C)  TempSrc: Oral  Oral Oral  SpO2: 97% 99% 99% 96%  Weight:      Height:        Intake/Output Summary (Last 24 hours) at 06/12/2024 1206 Last data filed at 06/12/2024 1000 Gross per 24 hour  Intake 1434.33 ml  Output 2100 ml  Net -665.67 ml   Filed Weights   06/10/24 0500 06/11/24 0500 06/12/24 0457  Weight: 99 kg 100 kg 100 kg    Examination:  Significantly debilitated, does not follow any commands, or answer any questions, does not open her eyes Right IJ TLC present, site looks clean Good air entry bilaterally, mildly tachypneic today Abdomen soft has trace generalized edema  Data Reviewed: I have personally reviewed following labs and imaging studies  CBC: Recent Labs  Lab 06/08/24 0328 06/09/24 0610 06/10/24 0238 06/11/24 0310 06/12/24 0617  WBC 8.3 8.6 7.5 8.0 6.8  NEUTROABS 4.7 5.0 3.6 3.8 3.5  HGB 10.1* 9.2* 8.8* 8.7* 10.4*  HCT 31.2* 28.7* 27.8* 27.8* 32.8*  MCV 107.6* 110.4* 109.4* 110.8* 109.3*  PLT 124* 106* 98* 90* 89*    Basic Metabolic Panel: Recent Labs  Lab 06/06/24 0241 06/07/24 0325 06/08/24 0328 06/09/24 0610  06/10/24 0238 06/11/24 0310 06/12/24 0617  NA 131* 130* 133* 136 135 138 136  K 4.1 3.6 3.2* 4.1 4.4 4.8 4.5  CL 99 101 103 102 104 104 102  CO2 23 24 25 27 27 28 27   GLUCOSE 76 84 151* 168* 204* 181* 148*  BUN 15 10 9 23  24* 25* 25*  CREATININE 0.71 0.63 0.66 0.60 0.62 0.72 0.65  CALCIUM  7.7* 7.4* 7.6* 7.4* 7.4* 7.5* 7.8*  MG 1.7 1.8 1.9 2.4  --   --   --   PHOS 3.7 2.5 2.0* 1.3*  --   --   --     GFR: Estimated Creatinine Clearance: 80.2 mL/min (by C-G formula based on SCr of 0.65 mg/dL).  Liver Function Tests: No results for input(s): AST, ALT, ALKPHOS, BILITOT, PROT, ALBUMIN  in the last 168 hours.   CBG: Recent Labs  Lab 06/11/24 2011 06/12/24 0012 06/12/24 0410 06/12/24 0804 06/12/24 1135  GLUCAP 131* 123* 129* 123* 177*     Recent Results (from the past 240 hours)  Resp panel by RT-PCR (RSV, Flu A&B, Covid)     Status: None   Collection Time: 06/04/24  4:50 PM   Specimen: Nasal Swab  Result Value Ref Range  Status   SARS Coronavirus 2 by RT PCR NEGATIVE NEGATIVE Final   Influenza A by PCR NEGATIVE NEGATIVE Final   Influenza B by PCR NEGATIVE NEGATIVE Final    Comment: (NOTE) The Xpert Xpress SARS-CoV-2/FLU/RSV plus assay is intended as an aid in the diagnosis of influenza from Nasopharyngeal swab specimens and should not be used as a sole basis for treatment. Nasal washings and aspirates are unacceptable for Xpert Xpress SARS-CoV-2/FLU/RSV testing.  Fact Sheet for Patients: bloggercourse.com  Fact Sheet for Healthcare Providers: seriousbroker.it  This test is not yet approved or cleared by the [place]  FDA and has been authorized for detection and/or diagnosis of SARS-CoV-2 by FDA under an Emergency Use Authorization (EUA). This EUA will remain in effect (meaning this test can be used) for the duration of the COVID-19 declaration under Section 564(b)(1) of the Act, 21 U.S.C. section  360bbb-3(b)(1), unless the authorization is terminated or revoked.     Resp Syncytial Virus by PCR NEGATIVE NEGATIVE Final    Comment: (NOTE) Fact Sheet for Patients: bloggercourse.com  Fact Sheet for Healthcare Providers: seriousbroker.it  This test is not yet approved or cleared by the [place]  FDA and has been authorized for detection and/or diagnosis of SARS-CoV-2 by FDA under an Emergency Use Authorization (EUA). This EUA will remain in effect (meaning this test can be used) for the duration of the COVID-19 declaration under Section 564(b)(1) of the Act, 21 U.S.C. section 360bbb-3(b)(1), unless the authorization is terminated or revoked.  Performed at [facility], [address] [place], [place] [address]   Urine Culture     Status: None   Collection Time: 06/04/24  4:52 PM   Specimen: Urine, Catheterized  Result Value Ref Range Status   Specimen Description URINE, CATHETERIZED  Final   Special Requests NONE  Final   Culture   Final    NO GROWTH Performed at [facility], [address], [place], [place] [address]    Report Status 06/05/2024 FINAL  Final  Blood Culture (routine x 2)     Status: None   Collection Time: 06/04/24  4:55 PM   Specimen: BLOOD RIGHT ARM  Result Value Ref Range Status   Specimen Description BLOOD RIGHT ARM  Final   Special Requests   Final    BOTTLES DRAWN AEROBIC ONLY Blood Culture results may not be optimal due to an inadequate volume of blood received in culture bottles   Culture   Final    NO GROWTH 5 DAYS Performed at [facility], [address] [place], [place] [address]    Report Status 06/09/2024 FINAL  Final  Blood Culture (routine x 2)     Status: Abnormal   Collection Time: 06/04/24  5:35 PM   Specimen: BLOOD LEFT HAND  Result Value Ref Range Status   Specimen Description BLOOD LEFT HAND  Final   Special Requests   Final    BOTTLES DRAWN AEROBIC AND  ANAEROBIC Blood Culture results may not be optimal due to an inadequate volume of blood received in culture bottles   Culture  Setup Time   Final    GRAM NEGATIVE RODS AEROBIC BOTTLE ONLY RBV V  BRYKPHARMD  06/06/2024 @0617  BY DD Performed at [facility], [address] [place], [place] [address]    Culture PSEUDOMONAS AERUGINOSA (A)  Final   Report Status 06/08/2024 FINAL  Final   Organism ID, Bacteria PSEUDOMONAS AERUGINOSA  Final  Susceptibility   Pseudomonas aeruginosa - MIC*    MEROPENEM <=0.25 SENSITIVE Sensitive     CIPROFLOXACIN 2 RESISTANT Resistant     IMIPENEM 2 SENSITIVE Sensitive     PIP/TAZO Value in next row Sensitive      8 SENSITIVEThis is a modified FDA-approved test that has been validated and its performance characteristics determined by the reporting laboratory.  This laboratory is certified under the Clinical Laboratory Improvement Amendments CLIA as qualified to perform high complexity clinical laboratory testing.    CEFTAZIDIME/AVIBACTAM Value in next row Sensitive      8 SENSITIVEThis is a modified FDA-approved test that has been validated and its performance characteristics determined by the reporting laboratory.  This laboratory is certified under the Clinical Laboratory Improvement Amendments CLIA as qualified to perform high complexity clinical laboratory testing.    CEFTOLOZANE/TAZOBACTAM Value in next row Sensitive      8 SENSITIVEThis is a modified FDA-approved test that has been validated and its performance characteristics determined by the reporting laboratory.  This laboratory is certified under the Clinical Laboratory Improvement Amendments CLIA as qualified to perform high complexity clinical laboratory testing.    TOBRAMYCIN Value in next row Sensitive      8 SENSITIVEThis is a modified FDA-approved test that has been validated and its performance characteristics determined by the reporting laboratory.  This laboratory is certified under the  Clinical Laboratory Improvement Amendments CLIA as qualified to perform high complexity clinical laboratory testing.    CEFTAZIDIME Value in next row Sensitive      8 SENSITIVEThis is a modified FDA-approved test that has been validated and its performance characteristics determined by the reporting laboratory.  This laboratory is certified under the Clinical Laboratory Improvement Amendments CLIA as qualified to perform high complexity clinical laboratory testing.    * PSEUDOMONAS AERUGINOSA  Blood Culture ID Panel (Reflexed)     Status: Abnormal   Collection Time: 06/04/24  5:35 PM  Result Value Ref Range Status   Enterococcus faecalis NOT DETECTED NOT DETECTED Final   Enterococcus Faecium NOT DETECTED NOT DETECTED Final   Listeria monocytogenes NOT DETECTED NOT DETECTED Final   Staphylococcus species NOT DETECTED NOT DETECTED Final   Staphylococcus aureus (BCID) NOT DETECTED NOT DETECTED Final   Staphylococcus epidermidis NOT DETECTED NOT DETECTED Final   Staphylococcus lugdunensis NOT DETECTED NOT DETECTED Final   Streptococcus species NOT DETECTED NOT DETECTED Final   Streptococcus agalactiae NOT DETECTED NOT DETECTED Final   Streptococcus pneumoniae NOT DETECTED NOT DETECTED Final   Streptococcus pyogenes NOT DETECTED NOT DETECTED Final   A.calcoaceticus-baumannii NOT DETECTED NOT DETECTED Final   Bacteroides fragilis NOT DETECTED NOT DETECTED Final   Enterobacterales NOT DETECTED NOT DETECTED Final   Enterobacter cloacae complex NOT DETECTED NOT DETECTED Final   Escherichia coli NOT DETECTED NOT DETECTED Final   Klebsiella aerogenes NOT DETECTED NOT DETECTED Final   Klebsiella oxytoca NOT DETECTED NOT DETECTED Final   Klebsiella pneumoniae NOT DETECTED NOT DETECTED Final   Proteus species NOT DETECTED NOT DETECTED Final   Salmonella species NOT DETECTED NOT DETECTED Final   Serratia marcescens NOT DETECTED NOT DETECTED Final   Haemophilus influenzae NOT DETECTED NOT DETECTED Final    Neisseria meningitidis NOT DETECTED NOT DETECTED Final   Pseudomonas aeruginosa DETECTED (A) NOT DETECTED Final    Comment: CRITICAL RESULT CALLED TO, READ BACK BY AND VERIFIED WITH: RBV V  BRYKPHARMD  06/06/2024 @0617  BY DD    Stenotrophomonas maltophilia NOT DETECTED NOT DETECTED Final  Candida albicans NOT DETECTED NOT DETECTED Final   Candida auris NOT DETECTED NOT DETECTED Final   Candida glabrata NOT DETECTED NOT DETECTED Final   Candida krusei NOT DETECTED NOT DETECTED Final   Candida parapsilosis NOT DETECTED NOT DETECTED Final   Candida tropicalis NOT DETECTED NOT DETECTED Final   Cryptococcus neoformans/gattii NOT DETECTED NOT DETECTED Final   CTX-M ESBL NOT DETECTED NOT DETECTED Final   Carbapenem resistance IMP NOT DETECTED NOT DETECTED Final   Carbapenem resistance KPC NOT DETECTED NOT DETECTED Final   Carbapenem resistance NDM NOT DETECTED NOT DETECTED Final   Carbapenem resistance VIM NOT DETECTED NOT DETECTED Final    Comment: Performed at [facility], [address] [place], [place] [address]  Respiratory (~20 pathogens) panel by PCR     Status: None   Collection Time: 06/05/24  2:30 AM   Specimen: Nasopharyngeal Swab; Respiratory  Result Value Ref Range Status   Adenovirus NOT DETECTED NOT DETECTED Final   Coronavirus 229E NOT DETECTED NOT DETECTED Final    Comment: (NOTE) The Coronavirus on the Respiratory Panel, DOES NOT test for the novel  Coronavirus (2019 nCoV)    Coronavirus HKU1 NOT DETECTED NOT DETECTED Final   Coronavirus NL63 NOT DETECTED NOT DETECTED Final   Coronavirus OC43 NOT DETECTED NOT DETECTED Final   Metapneumovirus NOT DETECTED NOT DETECTED Final   Rhinovirus / Enterovirus NOT DETECTED NOT DETECTED Final   Influenza A NOT DETECTED NOT DETECTED Final   Influenza B NOT DETECTED NOT DETECTED Final   Parainfluenza Virus 1 NOT DETECTED NOT DETECTED Final   Parainfluenza Virus 2 NOT DETECTED NOT DETECTED Final   Parainfluenza Virus 3  NOT DETECTED NOT DETECTED Final   Parainfluenza Virus 4 NOT DETECTED NOT DETECTED Final   Respiratory Syncytial Virus NOT DETECTED NOT DETECTED Final   Bordetella pertussis NOT DETECTED NOT DETECTED Final   Bordetella Parapertussis NOT DETECTED NOT DETECTED Final   Chlamydophila pneumoniae NOT DETECTED NOT DETECTED Final   Mycoplasma pneumoniae NOT DETECTED NOT DETECTED Final    Comment: Performed at [facility], [address], [place], [place] [address]         Radiology Studies: No results found.      Scheduled Meds:  amiodarone   200 mg Per Tube Daily   Chlorhexidine  Gluconate Cloth  6 each Topical Daily   collagenase   Topical Daily   enoxaparin  (LOVENOX ) injection  40 mg Subcutaneous Q24H   estradiol  1 Applicatorful Vaginal Once per day on Tuesday Friday   famotidine   20 mg Per Tube BID   feeding supplement (PROSource TF20)  60 mL Per Tube Daily   furosemide   20 mg Oral Daily   Gerhardt's butt cream   Topical TID   levETIRAcetam   1,000 mg Per Tube BID   nutrition supplement (JUVEN)  1 packet Per Tube BID BM   potassium chloride   20 mEq Oral Daily   simethicone   80 mg Per Tube TID   valproic  acid  500 mg Per Tube TID   valproic  acid  750 mg Per Tube QHS   Continuous Infusions:  feeding supplement (OSMOLITE 1.2 CAL) 1,000 mL (06/12/24 1056)   piperacillin-tazobactam (ZOSYN)  IV 12.5 mL/hr at 06/12/24 1000     LOS: 6 days      Brayton Lye, MD [facility]   To contact the attending provider between 7A-7P or the covering provider during after hours 7P-7A, please log into the web site [email or web address] and access using  universal [place] password for that web site. If you do not have the password, please call the hospital operator.  06/12/2024, 12:06 PM

## 2024-06-13 DIAGNOSIS — A419 Sepsis, unspecified organism: Secondary | ICD-10-CM | POA: Diagnosis not present

## 2024-06-13 DIAGNOSIS — I959 Hypotension, unspecified: Secondary | ICD-10-CM | POA: Diagnosis not present

## 2024-06-13 LAB — BASIC METABOLIC PANEL WITH GFR
Anion gap: 9 (ref 5–15)
BUN: 23 mg/dL (ref 8–23)
CO2: 25 mmol/L (ref 22–32)
Calcium: 7.7 mg/dL — ABNORMAL LOW (ref 8.9–10.3)
Chloride: 100 mmol/L (ref 98–111)
Creatinine, Ser: 0.58 mg/dL (ref 0.44–1.00)
GFR, Estimated: >60 mL/min (ref >60)
Glucose, Bld: 132 mg/dL — ABNORMAL HIGH (ref 70–99)
Potassium: 4.6 mmol/L (ref 3.5–5.1)
Sodium: 134 mmol/L — ABNORMAL LOW (ref 135–145)

## 2024-06-13 LAB — GLUCOSE, CAPILLARY
Glucose-Capillary: 131 mg/dL — ABNORMAL HIGH (ref 70–99)
Glucose-Capillary: 124 mg/dL — ABNORMAL HIGH (ref 70–99)
Glucose-Capillary: 103 mg/dL — ABNORMAL HIGH (ref 70–99)
Glucose-Capillary: 91 mg/dL (ref 70–99)
Glucose-Capillary: 57 mg/dL — ABNORMAL LOW (ref 70–99)
Glucose-Capillary: 121 mg/dL — ABNORMAL HIGH (ref 70–99)
Glucose-Capillary: 112 mg/dL — ABNORMAL HIGH (ref 70–99)
Glucose-Capillary: 65 mg/dL — ABNORMAL LOW (ref 70–99)

## 2024-06-13 MED ORDER — DEXTROSE 50 % IV SOLN
25.0000 g | INTRAVENOUS | Status: AC
  Administered 2024-06-13: 12.5 g via INTRAVENOUS
  Filled 2024-06-13: qty 50

## 2024-06-13 MED ORDER — SODIUM BICARBONATE 650 MG PO TABS
650.0000 mg | ORAL_TABLET | Freq: Once | ORAL | Status: AC
  Administered 2024-06-13: 650 mg
  Filled 2024-06-13: qty 1

## 2024-06-13 MED ORDER — DEXTROSE 250 MG/ML IV SOLN: 25.0000 g | Freq: Once | INTRAVENOUS | Status: DC

## 2024-06-13 MED ORDER — PANCRELIPASE (LIP-PROT-AMYL) 10440-39150 UNITS PO TABS
20880.0000 [IU] | ORAL_TABLET | Freq: Once | ORAL | Status: DC
  Filled 2024-06-13: qty 2

## 2024-06-13 MED ORDER — SODIUM BICARBONATE 650 MG PO TABS
650.0000 mg | ORAL_TABLET | Freq: Once | ORAL | Status: DC
  Filled 2024-06-13: qty 1

## 2024-06-13 MED ORDER — DEXTROSE 5 % IV SOLN: INTRAVENOUS | Status: DC

## 2024-06-13 MED ORDER — PANCRELIPASE (LIP-PROT-AMYL) 10440-39150 UNITS PO TABS
20880.0000 [IU] | ORAL_TABLET | Freq: Once | ORAL | Status: AC
  Administered 2024-06-13: 20880 [IU]
  Filled 2024-06-13: qty 2

## 2024-06-13 MED ORDER — DEXTROSE 50 % IV SOLN: 25.0000 g | Freq: Once | INTRAVENOUS | Status: DC

## 2024-06-13 MED ORDER — DEXTROSE 50 % IV SOLN
1.0000 | Freq: Once | INTRAVENOUS | Status: AC
  Administered 2024-06-13: 50 mL via INTRAVENOUS
  Filled 2024-06-13: qty 50

## 2024-06-13 NOTE — Plan of Care (Signed)
  Problem: Clinical Measurements: Goal: Ability to maintain clinical measurements within normal limits will improve Outcome: Progressing Goal: Will remain free from infection Outcome: Progressing Goal: Respiratory complications will improve Outcome: Progressing   Problem: Safety: Goal: Ability to remain free from injury will improve Outcome: Progressing   Problem: Skin Integrity: Goal: Risk for impaired skin integrity will decrease Outcome: Progressing

## 2024-06-13 NOTE — Progress Notes (Signed)
 PROGRESS NOTE    Kathleen Jensen  FMW:[id number] DOB: August 31, 1950 DOA: 06/04/2024 PCP: System, Provider Not In     Chief Complaint  Patient presents with   Altered Mental Status    Brief Narrative:   73 y.o. 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   Significant events: -  8/8-->04/07/19/2025 patient for MRSA bacteremia/endocarditis, not a candidate for pacemaker explant, will need lifelong cefadroxil . -  10/22-11/3 admitted due to Pseudomonas UTI/Foley catheter  And feeding tube leakage CT showed jejunostomy in place adynamic ileus and treated for a UTI secondary to Pseudomonas palliative care was consulted and palliative/hospice was recommended as an outpatient  - 11/7 admitted for sepsis/encephalopathy .  Significant for Pseudomonas bacteremia Blood culture now growing Pseudomonas 1/3 bottle - 11/9 more hypotensive central line placed by CCM right IJ   Assessment & Plan:   Principal Problem:   Sepsis associated hypotension (HCC) Active Problems:   Seizures (HCC)   OBSTRUCTIVE SLEEP APNEA   Essential hypertension   History of expressive aphasia   Pulmonary embolism and infarction (HCC)   PAF (paroxysmal atrial fibrillation) (HCC)   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HCC)   Bedbound     Severe sepsis, POA due to Pseudomonas bacteremia -Recent hospitalization for Pseudomonas UTI. - Bently with 1 blood culture growing Pseudomonas bacteremia -Patient was discharged on Cipro, apparently it resistant to Cipro, continue with Zosyn. - ID input greatly appreciated, will need IV Zosyn for 7 to 10 days, today is day #6, will continue for  another 3 days.  To finish total of 10 days, will need surveillance blood cultures in 2 weeks  - Patient keeps spiking intermittent fever despite appropriate antibiotic treatment, only possible access at this point is her TLC, cannot replace PICC line or peripheral IV due to noncompressible veins.  Hypernatremia - Improved.  Continue free water  per tube   Hyponatremia - Mild.  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  Continue amiodarone  and metoprolol    Essential hypertension--continue metoprolol    Diabetes mellitus type 2  - Overall CBG is controlled, continue to monitor CBG.   GERD -Continue famotidine    Hypokalemia - Replace   Thrombocytopenia - Mild.  At baseline  Difficult IV access - Unfortunately she is having noncompressible peripheral veins, so PIV or PICC line is not an option, so we have to continue with TLC for now (IR PICC line will not be any different as it is still a central access)   Nutrition Dysphagia - Continue jejunostomy tube feeding as per dietitian recommendations.  Continue free water  per tube   Obesity class I - Outpatient follow-up  Sick sinus syndrome not a candidate for explantation - Is maintained on monitors as normal sinus rhythm for  now  Goals of care - Overall prognosis is poor.    Palliative care consult greatly appreciated,  DSS/family meeting with palliative medicine on 06/10/2024, continue with  current measures for now, and to readdress goals of care next week.   - But overall prognosis appears to be very poor, with recurrent infections, for which source cannot be totally controlled, due to chronic indwelling Foley catheter, and presence of AICD, with high risk of clearing her bacteremia and infections keep rehab and especially with difficult access at this point.  DVT prophylaxis: (Lovenox ) Code Status: (Full) Family Communication: ( none at bedside ) Disposition:   Status is: Inpatient    Consultants:  Palliative  ID   Subjective: Nonverbal, noncommunicative, no fever overnight  Objective: Vitals:   06/13/24 0400 06/13/24 0516 06/13/24 0759 06/13/24 1100  BP: (!) 145/75  138/74 117/62  Pulse: 77  77   Resp: (!) 25  (!) 24   Temp: 98.5 F (36.9 C)  98.6 F (37 C) 98.7 F (37.1 C)  TempSrc: Axillary  Axillary   SpO2: 99%  100%   Weight:  100 kg    Height:        Intake/Output Summary (Last 24 hours) at 06/13/2024 1141 Last data filed at 06/13/2024 1029 Gross per 24 hour  Intake 1370.29 ml  Output 2525 ml  Net -1154.71 ml   Filed Weights   06/11/24 0500 06/12/24 0457 06/13/24 0516  Weight: 100 kg 100 kg 100 kg    Examination:  Significantly debilitated, does not follow any commands, or answer any questions, does not open her eyes Right IJ TLC present, site looks clean Good air entry, mildly tachypneic at baseline PEG present, abdomen soft has trace generalized edema  Data Reviewed: I have personally reviewed following labs and imaging studies  CBC: Recent Labs  Lab 06/08/24 0328 06/09/24 0610 06/10/24 0238 06/11/24 0310 06/12/24 0617  WBC 8.3 8.6 7.5 8.0 6.8  NEUTROABS 4.7 5.0 3.6 3.8 3.5  HGB 10.1* 9.2* 8.8* 8.7* 10.4*  HCT 31.2* 28.7* 27.8* 27.8* 32.8*  MCV 107.6* 110.4* 109.4* 110.8* 109.3*  PLT 124* 106* 98* 90* 89*    Basic Metabolic Panel: Recent Labs  Lab 06/07/24 0325 06/08/24 0328 06/09/24 0610 06/10/24 0238  06/11/24 0310 06/12/24 0617 06/13/24 0630  NA 130* 133* 136 135 138 136 134*  K 3.6 3.2* 4.1 4.4 4.8 4.5 4.6  CL 101 103 102 104 104 102 100  CO2 24 25 27 27 28 27 25   GLUCOSE 84 151* 168* 204* 181* 148* 132*  BUN 10 9 23  24* 25* 25* 23  CREATININE 0.63 0.66 0.60 0.62 0.72 0.65 0.58  CALCIUM  7.4* 7.6* 7.4* 7.4* 7.5* 7.8* 7.7*  MG 1.8 1.9 2.4  --   --   --   --   PHOS 2.5 2.0* 1.3*  --   --   --   --     GFR: Estimated Creatinine Clearance: 80.2 mL/min (by C-G formula based on SCr of 0.58 mg/dL).  Liver Function Tests: No results for input(s): AST, ALT, ALKPHOS, BILITOT, PROT, ALBUMIN  in the last 168 hours.   CBG: Recent Labs  Lab 06/12/24 1940 06/12/24 2339 06/13/24 0404 06/13/24 0758 06/13/24 1128  GLUCAP 169* 164* 131* 124* 103*     Recent Results (from the past 240 hours)  Resp panel by RT-PCR (RSV, Flu A&B, Covid)     Status: None   Collection Time: 06/04/24  4:50 PM   Specimen: Nasal Swab  Result  Value Ref Range Status   SARS Coronavirus 2 by RT PCR NEGATIVE NEGATIVE Final   Influenza A by PCR NEGATIVE NEGATIVE Final   Influenza B by PCR NEGATIVE NEGATIVE Final    Comment: (NOTE) The Xpert Xpress SARS-CoV-2/FLU/RSV plus assay is intended as an aid in the diagnosis of influenza from Nasopharyngeal swab specimens and should not be used as a sole basis for treatment. Nasal washings and aspirates are unacceptable for Xpert Xpress SARS-CoV-2/FLU/RSV testing.  Fact Sheet for Patients: bloggercourse.com  Fact Sheet for Healthcare Providers: seriousbroker.it  This test is not yet approved or cleared by the [place]  FDA and has been authorized for detection and/or diagnosis of SARS-CoV-2 by FDA under an Emergency Use Authorization (EUA). This EUA will remain in effect (meaning this test can be used) for the duration of the COVID-19 declaration under Section 564(b)(1) of the Act, 21  U.S.C. section 360bbb-3(b)(1), unless the authorization is terminated or revoked.     Resp Syncytial Virus by PCR NEGATIVE NEGATIVE Final    Comment: (NOTE) Fact Sheet for Patients: bloggercourse.com  Fact Sheet for Healthcare Providers: seriousbroker.it  This test is not yet approved or cleared by the [place]  FDA and has been authorized for detection and/or diagnosis of SARS-CoV-2 by FDA under an Emergency Use Authorization (EUA). This EUA will remain in effect (meaning this test can be used) for the duration of the COVID-19 declaration under Section 564(b)(1) of the Act, 21 U.S.C. section 360bbb-3(b)(1), unless the authorization is terminated or revoked.  Performed at [facility], [address] [place], [place] [address]   Urine Culture     Status: None   Collection Time: 06/04/24  4:52 PM   Specimen: Urine, Catheterized  Result Value Ref Range Status   Specimen Description URINE, CATHETERIZED  Final   Special Requests NONE  Final   Culture   Final    NO GROWTH Performed at [facility], [address], [place], [place] [address]    Report Status 06/05/2024 FINAL  Final  Blood Culture (routine x 2)     Status: None   Collection Time: 06/04/24  4:55 PM   Specimen: BLOOD RIGHT ARM  Result Value Ref Range Status   Specimen Description BLOOD RIGHT ARM  Final   Special Requests   Final    BOTTLES DRAWN AEROBIC ONLY Blood Culture results may not be optimal due to an inadequate volume of blood received in culture bottles   Culture   Final    NO GROWTH 5 DAYS Performed at [facility], [address], [place], [place] [address]    Report Status 06/09/2024 FINAL  Final  Blood Culture (routine x 2)     Status: Abnormal   Collection Time: 06/04/24  5:35 PM   Specimen: BLOOD LEFT HAND  Result Value Ref Range Status   Specimen Description BLOOD LEFT HAND  Final   Special Requests   Final    BOTTLES  DRAWN AEROBIC AND ANAEROBIC Blood Culture results may not be optimal due to an inadequate volume of blood received in culture bottles   Culture  Setup Time   Final    GRAM NEGATIVE RODS AEROBIC BOTTLE ONLY RBV V  BRYKPHARMD  06/06/2024 @0617  BY DD Performed at [facility], [address] [place], [place] [address]    Culture PSEUDOMONAS AERUGINOSA (A)  Final   Report Status 06/08/2024 FINAL  Final   Organism ID, Bacteria PSEUDOMONAS AERUGINOSA  Final  Susceptibility   Pseudomonas aeruginosa - MIC*    MEROPENEM <=0.25 SENSITIVE Sensitive     CIPROFLOXACIN 2 RESISTANT Resistant     IMIPENEM 2 SENSITIVE Sensitive     PIP/TAZO Value in next row Sensitive      8 SENSITIVEThis is a modified FDA-approved test that has been validated and its performance characteristics determined by the reporting laboratory.  This laboratory is certified under the Clinical Laboratory Improvement Amendments CLIA as qualified to perform high complexity clinical laboratory testing.    CEFTAZIDIME/AVIBACTAM Value in next row Sensitive      8 SENSITIVEThis is a modified FDA-approved test that has been validated and its performance characteristics determined by the reporting laboratory.  This laboratory is certified under the Clinical Laboratory Improvement Amendments CLIA as qualified to perform high complexity clinical laboratory testing.    CEFTOLOZANE/TAZOBACTAM Value in next row Sensitive      8 SENSITIVEThis is a modified FDA-approved test that has been validated and its performance characteristics determined by the reporting laboratory.  This laboratory is certified under the Clinical Laboratory Improvement Amendments CLIA as qualified to perform high complexity clinical laboratory testing.    TOBRAMYCIN Value in next row Sensitive      8 SENSITIVEThis is a modified FDA-approved test that has been validated and its performance characteristics determined by the reporting laboratory.  This laboratory is  certified under the Clinical Laboratory Improvement Amendments CLIA as qualified to perform high complexity clinical laboratory testing.    CEFTAZIDIME Value in next row Sensitive      8 SENSITIVEThis is a modified FDA-approved test that has been validated and its performance characteristics determined by the reporting laboratory.  This laboratory is certified under the Clinical Laboratory Improvement Amendments CLIA as qualified to perform high complexity clinical laboratory testing.    * PSEUDOMONAS AERUGINOSA  Blood Culture ID Panel (Reflexed)     Status: Abnormal   Collection Time: 06/04/24  5:35 PM  Result Value Ref Range Status   Enterococcus faecalis NOT DETECTED NOT DETECTED Final   Enterococcus Faecium NOT DETECTED NOT DETECTED Final   Listeria monocytogenes NOT DETECTED NOT DETECTED Final   Staphylococcus species NOT DETECTED NOT DETECTED Final   Staphylococcus aureus (BCID) NOT DETECTED NOT DETECTED Final   Staphylococcus epidermidis NOT DETECTED NOT DETECTED Final   Staphylococcus lugdunensis NOT DETECTED NOT DETECTED Final   Streptococcus species NOT DETECTED NOT DETECTED Final   Streptococcus agalactiae NOT DETECTED NOT DETECTED Final   Streptococcus pneumoniae NOT DETECTED NOT DETECTED Final   Streptococcus pyogenes NOT DETECTED NOT DETECTED Final   A.calcoaceticus-baumannii NOT DETECTED NOT DETECTED Final   Bacteroides fragilis NOT DETECTED NOT DETECTED Final   Enterobacterales NOT DETECTED NOT DETECTED Final   Enterobacter cloacae complex NOT DETECTED NOT DETECTED Final   Escherichia coli NOT DETECTED NOT DETECTED Final   Klebsiella aerogenes NOT DETECTED NOT DETECTED Final   Klebsiella oxytoca NOT DETECTED NOT DETECTED Final   Klebsiella pneumoniae NOT DETECTED NOT DETECTED Final   Proteus species NOT DETECTED NOT DETECTED Final   Salmonella species NOT DETECTED NOT DETECTED Final   Serratia marcescens NOT DETECTED NOT DETECTED Final   Haemophilus influenzae NOT  DETECTED NOT DETECTED Final   Neisseria meningitidis NOT DETECTED NOT DETECTED Final   Pseudomonas aeruginosa DETECTED (A) NOT DETECTED Final    Comment: CRITICAL RESULT CALLED TO, READ BACK BY AND VERIFIED WITH: RBV V  BRYKPHARMD  06/06/2024 @0617  BY DD    Stenotrophomonas maltophilia NOT DETECTED NOT DETECTED Final  Candida albicans NOT DETECTED NOT DETECTED Final   Candida auris NOT DETECTED NOT DETECTED Final   Candida glabrata NOT DETECTED NOT DETECTED Final   Candida krusei NOT DETECTED NOT DETECTED Final   Candida parapsilosis NOT DETECTED NOT DETECTED Final   Candida tropicalis NOT DETECTED NOT DETECTED Final   Cryptococcus neoformans/gattii NOT DETECTED NOT DETECTED Final   CTX-M ESBL NOT DETECTED NOT DETECTED Final   Carbapenem resistance IMP NOT DETECTED NOT DETECTED Final   Carbapenem resistance KPC NOT DETECTED NOT DETECTED Final   Carbapenem resistance NDM NOT DETECTED NOT DETECTED Final   Carbapenem resistance VIM NOT DETECTED NOT DETECTED Final    Comment: Performed at [facility], [address] [place], [place] [address]  Respiratory (~20 pathogens) panel by PCR     Status: None   Collection Time: 06/05/24  2:30 AM   Specimen: Nasopharyngeal Swab; Respiratory  Result Value Ref Range Status   Adenovirus NOT DETECTED NOT DETECTED Final   Coronavirus 229E NOT DETECTED NOT DETECTED Final    Comment: (NOTE) The Coronavirus on the Respiratory Panel, DOES NOT test for the novel  Coronavirus (2019 nCoV)    Coronavirus HKU1 NOT DETECTED NOT DETECTED Final   Coronavirus NL63 NOT DETECTED NOT DETECTED Final   Coronavirus OC43 NOT DETECTED NOT DETECTED Final   Metapneumovirus NOT DETECTED NOT DETECTED Final   Rhinovirus / Enterovirus NOT DETECTED NOT DETECTED Final   Influenza A NOT DETECTED NOT DETECTED Final   Influenza B NOT DETECTED NOT DETECTED Final   Parainfluenza Virus 1 NOT DETECTED NOT DETECTED Final   Parainfluenza Virus 2 NOT DETECTED NOT DETECTED Final    Parainfluenza Virus 3 NOT DETECTED NOT DETECTED Final   Parainfluenza Virus 4 NOT DETECTED NOT DETECTED Final   Respiratory Syncytial Virus NOT DETECTED NOT DETECTED Final   Bordetella pertussis NOT DETECTED NOT DETECTED Final   Bordetella Parapertussis NOT DETECTED NOT DETECTED Final   Chlamydophila pneumoniae NOT DETECTED NOT DETECTED Final   Mycoplasma pneumoniae NOT DETECTED NOT DETECTED Final    Comment: Performed at [facility], [address], [place], [place] [address]         Radiology Studies: No results found.      Scheduled Meds:  amiodarone   200 mg Per Tube Daily   Chlorhexidine  Gluconate Cloth  6 each Topical Daily   collagenase   Topical Daily   enoxaparin  (LOVENOX ) injection  40 mg Subcutaneous Q24H   estradiol  1 Applicatorful Vaginal Once per day on Tuesday Friday   famotidine   20 mg Per Tube BID   feeding supplement (PROSource TF20)  60 mL Per Tube Daily   furosemide   20 mg Oral Daily   Gerhardt's butt cream   Topical TID   insulin  aspart  0-9 Units Subcutaneous Q4H   levETIRAcetam   1,000 mg Per Tube BID   nutrition supplement (JUVEN)  1 packet Per Tube BID BM   potassium chloride   20 mEq Oral Daily   simethicone   80 mg Per Tube TID   valproic  acid  500 mg Per Tube TID   valproic  acid  750 mg Per Tube QHS   Continuous Infusions:  feeding supplement (OSMOLITE 1.2 CAL) 60 mL/hr at 06/13/24 0643   piperacillin-tazobactam (ZOSYN)  IV 3.375 g (06/13/24 0935)     LOS: 7 days      Brayton Lye, MD [facility]   To contact the attending provider between 7A-7P or the covering provider during after hours 7P-7A, please  log into the web site [email or web address] and access using universal [place] password for that web site. If you do not have the password, please call the hospital operator.  06/13/2024, 11:41 AM

## 2024-06-14 ENCOUNTER — Inpatient Hospital Stay (HOSPITAL_COMMUNITY)

## 2024-06-14 DIAGNOSIS — A419 Sepsis, unspecified organism: Secondary | ICD-10-CM | POA: Diagnosis not present

## 2024-06-14 DIAGNOSIS — I959 Hypotension, unspecified: Secondary | ICD-10-CM | POA: Diagnosis not present

## 2024-06-14 HISTORY — PX: IR REPLC DUODEN/JEJUNO TUBE PERCUT W/FLUORO: IMG2334

## 2024-06-14 HISTORY — PX: IR REPLC GASTRO/COLONIC TUBE PERCUT W/FLUORO: IMG2333

## 2024-06-14 LAB — BASIC METABOLIC PANEL WITH GFR
Anion gap: 8 (ref 5–15)
BUN: 16 mg/dL (ref 8–23)
CO2: 27 mmol/L (ref 22–32)
Calcium: 7.8 mg/dL — ABNORMAL LOW (ref 8.9–10.3)
Chloride: 96 mmol/L — ABNORMAL LOW (ref 98–111)
Creatinine, Ser: 0.64 mg/dL (ref 0.44–1.00)
GFR, Estimated: >60 mL/min (ref >60)
Glucose, Bld: 103 mg/dL — ABNORMAL HIGH (ref 70–99)
Potassium: 4.4 mmol/L (ref 3.5–5.1)
Sodium: 131 mmol/L — ABNORMAL LOW (ref 135–145)

## 2024-06-14 LAB — GLUCOSE, CAPILLARY
Glucose-Capillary: 103 mg/dL — ABNORMAL HIGH (ref 70–99)
Glucose-Capillary: 164 mg/dL — ABNORMAL HIGH (ref 70–99)
Glucose-Capillary: 191 mg/dL — ABNORMAL HIGH (ref 70–99)
Glucose-Capillary: 57 mg/dL — ABNORMAL LOW (ref 70–99)
Glucose-Capillary: 62 mg/dL — ABNORMAL LOW (ref 70–99)
Glucose-Capillary: 62 mg/dL — ABNORMAL LOW (ref 70–99)
Glucose-Capillary: 70 mg/dL (ref 70–99)
Glucose-Capillary: 83 mg/dL (ref 70–99)
Glucose-Capillary: 84 mg/dL (ref 70–99)
Glucose-Capillary: 85 mg/dL (ref 70–99)

## 2024-06-14 MED ORDER — VALPROATE SODIUM 100 MG/ML IV SOLN
750.0000 mg | Freq: Every day | INTRAVENOUS | Status: DC
  Administered 2024-06-15: 750 mg via INTRAVENOUS
  Filled 2024-06-14 (×2): qty 7.5

## 2024-06-14 MED ORDER — SODIUM CHLORIDE 0.9% FLUSH
10.0000 mL | Freq: Two times a day (BID) | INTRAVENOUS | Status: DC
  Administered 2024-06-14 – 2024-06-15 (×3): 10 mL

## 2024-06-14 MED ORDER — ACETAMINOPHEN 500 MG PO TABS
500.0000 mg | ORAL_TABLET | Freq: Four times a day (QID) | ORAL | Status: DC
  Administered 2024-06-14 – 2024-06-15 (×4): 500 mg via JEJUNOSTOMY
  Filled 2024-06-14 (×4): qty 1

## 2024-06-14 MED ORDER — VALPROATE SODIUM 100 MG/ML IV SOLN
500.0000 mg | Freq: Three times a day (TID) | INTRAVENOUS | Status: DC
  Administered 2024-06-14 – 2024-06-15 (×3): 500 mg via INTRAVENOUS
  Filled 2024-06-14: qty 5
  Filled 2024-06-14: qty 500
  Filled 2024-06-14 (×3): qty 5
  Filled 2024-06-14: qty 500
  Filled 2024-06-14: qty 5

## 2024-06-14 MED ORDER — DEXTROSE 50 % IV SOLN
25.0000 g | Freq: Once | INTRAVENOUS | Status: AC
  Administered 2024-06-14: 25 g via INTRAVENOUS
  Filled 2024-06-14: qty 50

## 2024-06-14 MED ORDER — FAMOTIDINE IN NACL 20-0.9 MG/50ML-% IV SOLN
20.0000 mg | Freq: Two times a day (BID) | INTRAVENOUS | Status: DC
  Administered 2024-06-14 – 2024-06-15 (×3): 20 mg via INTRAVENOUS
  Filled 2024-06-14 (×3): qty 50

## 2024-06-14 MED ORDER — ACETAMINOPHEN 650 MG RE SUPP: 650.0000 mg | Freq: Four times a day (QID) | RECTAL | Status: DC

## 2024-06-14 MED ORDER — DEXTROSE 50 % IV SOLN
INTRAVENOUS | Status: AC
  Administered 2024-06-14: 12.5 g via INTRAVENOUS
  Filled 2024-06-14: qty 50

## 2024-06-14 MED ORDER — FUROSEMIDE 10 MG/ML IJ SOLN
10.0000 mg | Freq: Every day | INTRAMUSCULAR | Status: DC
  Administered 2024-06-14 – 2024-06-15 (×2): 10 mg via INTRAVENOUS
  Filled 2024-06-14 (×2): qty 2

## 2024-06-14 MED ORDER — LEVETIRACETAM (KEPPRA) 500 MG/5 ML ADULT IV PUSH
1000.0000 mg | Freq: Two times a day (BID) | INTRAVENOUS | Status: DC
  Administered 2024-06-14 – 2024-06-15 (×3): 1000 mg via INTRAVENOUS
  Filled 2024-06-14 (×3): qty 10

## 2024-06-14 MED ORDER — DEXTROSE 50 % IV SOLN: 12.5000 g | INTRAVENOUS | Status: AC

## 2024-06-14 MED ORDER — DEXTROSE-SODIUM CHLORIDE 5-0.45 % IV SOLN
INTRAVENOUS | Status: DC
Start: 2024-06-14 — End: 2024-06-14

## 2024-06-14 MED ORDER — DEXTROSE 5 % IV SOLN: INTRAVENOUS | Status: DC

## 2024-06-14 MED ORDER — IOHEXOL 300 MG/ML  SOLN
50.0000 mL | Freq: Once | INTRAMUSCULAR | Status: AC | PRN
  Administered 2024-06-14: 20 mL

## 2024-06-14 MED ORDER — LIDOCAINE VISCOUS HCL 2 % MT SOLN
OROMUCOSAL | Status: AC
  Filled 2024-06-14: qty 15

## 2024-06-14 MED ORDER — SODIUM CHLORIDE 0.9% FLUSH: 10.0000 mL | INTRAVENOUS | Status: DC | PRN

## 2024-06-14 MED ORDER — CEFADROXIL 500 MG/5ML PO SUSR
500.0000 mg | Freq: Two times a day (BID) | ORAL | Status: DC
  Administered 2024-06-15: 500 mg via ORAL
  Filled 2024-06-14 (×2): qty 5

## 2024-06-14 MED ORDER — LIDOCAINE VISCOUS HCL 2 % MT SOLN
15.0000 mL | Freq: Four times a day (QID) | OROMUCOSAL | Status: DC | PRN
  Administered 2024-06-14: 10 mL via OROMUCOSAL
  Filled 2024-06-14: qty 15

## 2024-06-14 MED ORDER — ENOXAPARIN SODIUM 60 MG/0.6ML IJ SOSY
50.0000 mg | PREFILLED_SYRINGE | INTRAMUSCULAR | Status: DC
  Administered 2024-06-15: 50 mg via SUBCUTANEOUS
  Filled 2024-06-14: qty 0.6

## 2024-06-14 NOTE — Progress Notes (Signed)
 Pt. In IR.

## 2024-06-14 NOTE — Progress Notes (Addendum)
 Pt. Back from IR. This nurse able to flush water  very slowly down PEG tube but medications will still not go down. Notified Brandy in IR and Dr. Sherlon.

## 2024-06-14 NOTE — Progress Notes (Signed)
 Daily Progress Note   Patient Name: Kathleen Jensen       Date: 06/14/2024 DOB: Jul 01, 1951  Age: 73 y.o. MRN#: [id number] Attending Physician: Sherlon Brayton RAMAN, MD Primary Care Physician: System, Provider Not In Admit Date: 06/04/2024 Length of Stay: 8 days  Reason for Consultation/Follow-up: Establishing goals of care  Subjective:   Subjective: EMR reviewed including events of this weekend and notes from hospitalist.  Noted to still be spiking fevers on antibiotics.  Labs reviewed reveal sodium 131, potassium 4.4, creatinine 0.64.  No new imaging today.  I saw and examined patient today.  She remains minimally responsive with no meaningful interaction.  I called and left voicemail for patient's legal guardian, Kathleen Jensen.  Received callback from Kathleen Jensen later in the morning.  We discussed clinical course of the weekend, continued concern regarding recurrent infection, recommendation for DNR/DNI, and consideration for avoiding further hospitalizations as this is not a fixable problem long-term.  She asked regarding potential options whenever it comes time to for the patient to leave the hospital.  We discussed likely pathways of going back to long-term care facility with plan to return to the hospital with the next infection (discussed this would likely be in the very near future as this is a recurrent problem that is not going to go away, nor is it fixable), returning to long-term care with consideration for no further hospitalization and comfort focused approach (consideration for hospice support), and potential discharge home with home hospice as family had reported they want to work to get her home for end-of-life if feasible.   Questions and concerns addressed.   PMT will continue to support holistically.   Review of Systems Unable to obtain  Objective:   Vital Signs:  BP (!) 146/109 (BP Location: Right Wrist)   Pulse (!) 104   Temp 98.9 F (37.2 C)  (Axillary)   Resp 20   Ht 5' 10 (1.778 m)   Wt 99.2 kg   SpO2 100%   BMI 31.38 kg/m   Physical Exam: General: Chronically ill-appearing black female with contracture right upper extremity and bilateral swelling Cardiovascular: Regular rate and rhythm, no murmur appreciated Lungs: Diminished bilaterally Abdomen: Distended, PEG tube in place Extremities: +1 edema bilaterally in lower extremity Neuro: Tracks in room but unable to converse and does not follow commands.  Imaging: @IMAGES @  I personally reviewed recent imaging.   Assessment & Plan:   Assessment: 73 year old female who is a long-term nursing home with history of CVA, bedbound, chronic PEG, chronic Foley, seizure disorder, dementia, endocarditis on lifelong suppressive antibiotics admitted with sepsis and altered mental status   Recommendations/Plan: # Complex medical decision making/goals of care:  - Patient cannot participate in complex decision making secondary to underlying medical condition  - Paperwork completed by myself and Dr. Royal with recommendation for no attempt at resuscitation in the event of natural death.  Given to legal guardian to review DSS leadership last week.  At family meeting her daughters were in agreement with this.  Await decision of DSS regarding DNR/DNI status.  - Discussed with DSS regarding the fact that this is not a fixable problem and she is going to return to the hospital with recurrent infection.  Reviewed options of going back to long-term care with plan to return to the hospital, going back to long-term care with plan for no further hospitalizations, or potential transition home with the support of hospice organization  -[name] to discuss further with DSS leadership.  She will call with any questions which we can be of further assistance to help with her decision making process.  -  Code Status: Full Code  Prognosis: Unable to determine  # Symptom management: Patient is  receiving these palliative interventions for symptom management with an intent to improve quality of life.   - Pain: Acetaminophen  as needed  - Nausea: Zofran  as needed  # Psychosocial Support:  -- Support [place] She has 3 daughters who are involved and visit her regularly. - Desire for further Chaplain support:no  # Discharge Planning: To Be Determined  -  Discussed with: bedside staff  Thank you for allowing the palliative care team to participate in the care Kathleen Jensen.  Kathleen Meissner, MD Palliative Care Provider PMT # [phone]  If patient remains symptomatic despite maximum doses, please call PMT at [phone] between 0700 and 1900. Outside of these hours, please call attending, as PMT does not have night coverage.  I personally spent a total of 55 minutes in the care of the patient today including preparing to see the patient, getting/reviewing separately obtained history, performing a medically appropriate exam/evaluation, referring and communicating with other health care professionals, documenting clinical information in the EHR, and coordinating care.

## 2024-06-14 NOTE — TOC Progression Note (Signed)
 Transition of Care [facility]) - Progression Note    Patient Details  Name: Kathleen Jensen MRN: [id number] Date of Birth: 1951/06/26  Transition [facility] Care [facility]) CM/SW Contact  Inocente GORMAN Kindle, LCSW Phone Number: 06/14/2024, 9:14 AM  Clinical Narrative:    CSW continuing to follow for needs.    Expected Discharge Plan: Long Term Nursing Home Barriers to Discharge: Continued Medical Work up               Expected Discharge Plan and Services In-house Referral: Clinical Social Work   Post Acute Care Choice: Skilled Nursing Facility Living arrangements for the past 2 months: Skilled Nursing Facility                                       Social Drivers of Health (SDOH) Interventions SDOH Screenings   Food Insecurity: Patient Unable To Answer (05/20/2024)  Housing: Patient Unable To Answer (05/20/2024)  Transportation Needs: Patient Unable To Answer (03/25/2024)  Utilities: Patient Unable To Answer (03/25/2024)  Depression (PHQ2-9): Low Risk  (05/22/2019)  Social Connections: Unknown (03/25/2024)  Tobacco Use: Medium Risk (05/19/2024)    Readmission Risk Interventions    05/31/2024   12:39 PM 05/21/2024    1:03 PM 02/18/2022    2:23 PM  Readmission Risk Prevention Plan  Transportation Screening Complete Complete Complete  PCP or Specialist Appt within 5-7 Days   Complete  Home Care Screening   Complete  Medication Review (RN CM)   Complete  Medication Review (RN Care Manager) Complete Complete   PCP or Specialist appointment within 3-5 days of discharge Complete Complete   HRI or Home Care Consult Complete Complete   SW Recovery Care/Counseling Consult Complete Complete   Palliative Care Screening Complete Complete   Skilled Nursing Facility Not Applicable Not Applicable

## 2024-06-14 NOTE — Plan of Care (Signed)
  Problem: Nutrition: Goal: Adequate nutrition will be maintained Outcome: Progressing   Problem: Coping: Goal: Level of anxiety will decrease Outcome: Progressing   Problem: Elimination: Goal: Will not experience complications related to bowel motility Outcome: Progressing   Problem: Safety: Goal: Ability to remain free from injury will improve Outcome: Progressing   

## 2024-06-14 NOTE — Progress Notes (Signed)
 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 (nonsurgical candidate) planned for chronic suppression, admitted 06/04/24 for ams/pseudomonas bacteremia    11/7 bcx positive Patient febrile on admission and had remained intermittently febrile (last fever 11/15) Leukocytosis had resolved  -finish 10 day piptazo today, and can transition back to cefadroxil 500 mg po bid 11/18 -id clinic f/u 06/23/24 -will reassess patient in clinic; given resolved sepsis and low likelihood of pseudomonas to cause pacer infection will defer further w/u at this time -maintain standard isolation precaution -id will sign off -please remove central line -discussed with primary team  Principal Problem:   Sepsis associated hypotension (HCC) Active Problems:   OBSTRUCTIVE SLEEP APNEA   Essential hypertension   History of expressive aphasia   Pulmonary embolism and infarction (HCC)   Seizures (HCC)   PAF (paroxysmal atrial fibrillation) (HCC)   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HCC)   Bedbound   Allergies  Allergen Reactions   Tape Rash   Demerol [Meperidine Hcl] Nausea And Vomiting   Sulfonamide Derivatives Itching   Bacitracin-Polymyxin B Dermatitis    Not listed on the MAR   Oxycontin  [Oxycodone ] Itching    Scheduled Meds:  amiodarone   200 mg Per Tube Daily   [START ON 06/15/2024] cefadroxil  500 mg Oral Q12H   Chlorhexidine  Gluconate Cloth  6 each Topical Daily   collagenase   Topical Daily   [START ON 06/15/2024] enoxaparin  (LOVENOX ) injection  50 mg Subcutaneous Q24H   estradiol  1 Applicatorful Vaginal Once per day on Tuesday Friday   feeding supplement  (PROSource TF20)  60 mL Per Tube Daily   furosemide   10 mg Intravenous Daily   Gerhardt's butt cream   Topical TID   insulin  aspart  0-9 Units Subcutaneous Q4H   levETIRAcetam   1,000 mg Intravenous Q12H   lipase/protease/amylase)  20,880 Units Per Tube Once   And   sodium bicarbonate   650 mg Per Tube Once   nutrition supplement (JUVEN)  1 packet Per Tube BID BM   potassium chloride   20 mEq Oral Daily   simethicone   80 mg Per Tube TID   sodium chloride  flush  10-40 mL Intracatheter Q12H   Continuous Infusions:  dextrose  75 mL/hr at 06/14/24 1215   famotidine  (PEPCID ) IV 20 mg (06/14/24 1109)   feeding supplement (OSMOLITE 1.2 CAL) 1,000 mL (06/14/24 1532)   piperacillin-tazobactam (ZOSYN)  IV 3.375 g (06/14/24 1541)   valproate sodium  500 mg (06/14/24 1457)   valproate sodium      PRN Meds:.acetaminophen  **OR** acetaminophen , lidocaine , [DISCONTINUED] ondansetron  **OR** ondansetron  (ZOFRAN ) IV, sodium chloride  flush   SUBJECTIVE: Afebrile No event last 24 hours   Review of Systems: ROS All other ROS was negative, except mentioned above     OBJECTIVE: Vitals:   06/14/24 0500 06/14/24 0802 06/14/24 1140 06/14/24 1558  BP:  (!) 151/100 132/83 (!) 146/109  Pulse:  84 93 (!) 104  Resp:  (!) 23 (!) 24 20  Temp:  98.7 F (37.1 C) 98.5 F (36.9 C) 98.9 F (37.2 C)  TempSrc:  Axillary Axillary Axillary  SpO2:  100% 100% 100%  Weight: 99.2 kg     Height:  Body mass index is 31.38 kg/m.  Physical Exam General/constitutional: sleepy, well developed HEENT: Normocephalic CV: rrr no mrg Lungs: clear to auscultation, normal respiratory effort Abd: Soft, Nontender Ext: no edema Skin: No Rash Neuro: generalized weakness; otherwise deferred exam as patient not alert   Central line presence: right internal jugular cvc site no purulence/erythema/bleeding   Lab Results Lab Results  Component Value Date   WBC 6.8 06/12/2024   HGB 10.4 (L) 06/12/2024   HCT 32.8 (L)  06/12/2024   MCV 109.3 (H) 06/12/2024   PLT 89 (L) 06/12/2024    Lab Results  Component Value Date   CREATININE 0.64 06/14/2024   BUN 16 06/14/2024   NA 131 (L) 06/14/2024   K 4.4 06/14/2024   CL 96 (L) 06/14/2024   CO2 27 06/14/2024    Lab Results  Component Value Date   ALT 15 06/05/2024   AST 41 06/05/2024   ALKPHOS 140 (H) 06/05/2024   BILITOT 0.9 06/05/2024      Microbiology: Recent Results (from the past 240 hours)  Blood Culture (routine x 2)     Status: None   Collection Time: 06/04/24  4:55 PM   Specimen: BLOOD RIGHT ARM  Result Value Ref Range Status   Specimen Description BLOOD RIGHT ARM  Final   Special Requests   Final    BOTTLES DRAWN AEROBIC ONLY Blood Culture results may not be optimal due to an inadequate volume of blood received in culture bottles   Culture   Final    NO GROWTH 5 DAYS Performed at [facility], [address], [place], [place] [address]    Report Status 06/09/2024 FINAL  Final  Blood Culture (routine x 2)     Status: Abnormal   Collection Time: 06/04/24  5:35 PM   Specimen: BLOOD LEFT HAND  Result Value Ref Range Status   Specimen Description BLOOD LEFT HAND  Final   Special Requests   Final    BOTTLES DRAWN AEROBIC AND ANAEROBIC Blood Culture results may not be optimal due to an inadequate volume of blood received in culture bottles   Culture  Setup Time   Final    GRAM NEGATIVE RODS AEROBIC BOTTLE ONLY RBV V  BRYKPHARMD  06/06/2024 @0617  BY DD Performed at [facility], [address] [place], [place] [address]    Culture PSEUDOMONAS AERUGINOSA (A)  Final   Report Status 06/08/2024 FINAL  Final   Organism ID, Bacteria PSEUDOMONAS AERUGINOSA  Final      Susceptibility   Pseudomonas aeruginosa - MIC*    MEROPENEM <=0.25 SENSITIVE Sensitive     CIPROFLOXACIN 2 RESISTANT Resistant     IMIPENEM 2 SENSITIVE Sensitive     PIP/TAZO Value in next row Sensitive      8 SENSITIVEThis is a modified FDA-approved test that  has been validated and its performance characteristics determined by the reporting laboratory.  This laboratory is certified under the Clinical Laboratory Improvement Amendments CLIA as qualified to perform high complexity clinical laboratory testing.    CEFTAZIDIME/AVIBACTAM Value in next row Sensitive      8 SENSITIVEThis is a modified FDA-approved test that has been validated and its performance characteristics determined by the reporting laboratory.  This laboratory is certified under the Clinical Laboratory Improvement Amendments CLIA as qualified to perform high complexity clinical laboratory testing.    CEFTOLOZANE/TAZOBACTAM Value in next row Sensitive      8 SENSITIVEThis is a modified FDA-approved test that has been  validated and its performance characteristics determined by the reporting laboratory.  This laboratory is certified under the Clinical Laboratory Improvement Amendments CLIA as qualified to perform high complexity clinical laboratory testing.    TOBRAMYCIN Value in next row Sensitive      8 SENSITIVEThis is a modified FDA-approved test that has been validated and its performance characteristics determined by the reporting laboratory.  This laboratory is certified under the Clinical Laboratory Improvement Amendments CLIA as qualified to perform high complexity clinical laboratory testing.    CEFTAZIDIME Value in next row Sensitive      8 SENSITIVEThis is a modified FDA-approved test that has been validated and its performance characteristics determined by the reporting laboratory.  This laboratory is certified under the Clinical Laboratory Improvement Amendments CLIA as qualified to perform high complexity clinical laboratory testing.    * PSEUDOMONAS AERUGINOSA  Blood Culture ID Panel (Reflexed)     Status: Abnormal   Collection Time: 06/04/24  5:35 PM  Result Value Ref Range Status   Enterococcus faecalis NOT DETECTED NOT DETECTED Final   Enterococcus Faecium NOT DETECTED NOT  DETECTED Final   Listeria monocytogenes NOT DETECTED NOT DETECTED Final   Staphylococcus species NOT DETECTED NOT DETECTED Final   Staphylococcus aureus (BCID) NOT DETECTED NOT DETECTED Final   Staphylococcus epidermidis NOT DETECTED NOT DETECTED Final   Staphylococcus lugdunensis NOT DETECTED NOT DETECTED Final   Streptococcus species NOT DETECTED NOT DETECTED Final   Streptococcus agalactiae NOT DETECTED NOT DETECTED Final   Streptococcus pneumoniae NOT DETECTED NOT DETECTED Final   Streptococcus pyogenes NOT DETECTED NOT DETECTED Final   A.calcoaceticus-baumannii NOT DETECTED NOT DETECTED Final   Bacteroides fragilis NOT DETECTED NOT DETECTED Final   Enterobacterales NOT DETECTED NOT DETECTED Final   Enterobacter cloacae complex NOT DETECTED NOT DETECTED Final   Escherichia coli NOT DETECTED NOT DETECTED Final   Klebsiella aerogenes NOT DETECTED NOT DETECTED Final   Klebsiella oxytoca NOT DETECTED NOT DETECTED Final   Klebsiella pneumoniae NOT DETECTED NOT DETECTED Final   Proteus species NOT DETECTED NOT DETECTED Final   Salmonella species NOT DETECTED NOT DETECTED Final   Serratia marcescens NOT DETECTED NOT DETECTED Final   Haemophilus influenzae NOT DETECTED NOT DETECTED Final   Neisseria meningitidis NOT DETECTED NOT DETECTED Final   Pseudomonas aeruginosa DETECTED (A) NOT DETECTED Final    Comment: CRITICAL RESULT CALLED TO, READ BACK BY AND VERIFIED WITH: RBV V  BRYKPHARMD  06/06/2024 @0617  BY DD    Stenotrophomonas maltophilia NOT DETECTED NOT DETECTED Final   Candida albicans NOT DETECTED NOT DETECTED Final   Candida auris NOT DETECTED NOT DETECTED Final   Candida glabrata NOT DETECTED NOT DETECTED Final   Candida krusei NOT DETECTED NOT DETECTED Final   Candida parapsilosis NOT DETECTED NOT DETECTED Final   Candida tropicalis NOT DETECTED NOT DETECTED Final   Cryptococcus neoformans/gattii NOT DETECTED NOT DETECTED Final   CTX-M ESBL NOT DETECTED NOT DETECTED Final    Carbapenem resistance IMP NOT DETECTED NOT DETECTED Final   Carbapenem resistance KPC NOT DETECTED NOT DETECTED Final   Carbapenem resistance NDM NOT DETECTED NOT DETECTED Final   Carbapenem resistance VIM NOT DETECTED NOT DETECTED Final    Comment: Performed at [facility], [address] [place], [place] [address]  Respiratory (~20 pathogens) panel by PCR     Status: None   Collection Time: 06/05/24  2:30 AM   Specimen: Nasopharyngeal Swab; Respiratory  Result Value Ref Range Status   Adenovirus NOT DETECTED  NOT DETECTED Final   Coronavirus 229E NOT DETECTED NOT DETECTED Final    Comment: (NOTE) The Coronavirus on the Respiratory Panel, DOES NOT test for the novel  Coronavirus (2019 nCoV)    Coronavirus HKU1 NOT DETECTED NOT DETECTED Final   Coronavirus NL63 NOT DETECTED NOT DETECTED Final   Coronavirus OC43 NOT DETECTED NOT DETECTED Final   Metapneumovirus NOT DETECTED NOT DETECTED Final   Rhinovirus / Enterovirus NOT DETECTED NOT DETECTED Final   Influenza A NOT DETECTED NOT DETECTED Final   Influenza B NOT DETECTED NOT DETECTED Final   Parainfluenza Virus 1 NOT DETECTED NOT DETECTED Final   Parainfluenza Virus 2 NOT DETECTED NOT DETECTED Final   Parainfluenza Virus 3 NOT DETECTED NOT DETECTED Final   Parainfluenza Virus 4 NOT DETECTED NOT DETECTED Final   Respiratory Syncytial Virus NOT DETECTED NOT DETECTED Final   Bordetella pertussis NOT DETECTED NOT DETECTED Final   Bordetella Parapertussis NOT DETECTED NOT DETECTED Final   Chlamydophila pneumoniae NOT DETECTED NOT DETECTED Final   Mycoplasma pneumoniae NOT DETECTED NOT DETECTED Final    Comment: Performed at [facility], [address], [place], [place] [address]     Serology:   Imaging: If present, new imagings (plain films, ct scans, and mri) have been personally visualized and interpreted; radiology reports have been reviewed. Decision making incorporated into the Impression /  Recommendations.   Constance ONEIDA Passer, MD [facility] [phone] pager    06/14/2024, 4:52 PM

## 2024-06-14 NOTE — Progress Notes (Addendum)
 PROGRESS NOTE    Kathleen Jensen  FMW:[id number] DOB: 06/20/1951 DOA: 06/04/2024 PCP: System, Provider Not In     Chief Complaint  Patient presents with   Altered Mental Status    Brief Narrative:   73 y.o. 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   Significant events: -  8/8-->04/07/19/2025 patient for MRSA bacteremia/endocarditis, not a candidate for pacemaker explant, will need lifelong cefadroxil . -  10/22-11/3 admitted due to Pseudomonas UTI/Foley catheter  And feeding tube leakage CT showed jejunostomy in place adynamic ileus and treated for a UTI secondary to Pseudomonas palliative care was consulted and palliative/hospice was recommended as an outpatient  - 11/7 admitted for sepsis/encephalopathy .  Significant for Pseudomonas bacteremia Blood culture now growing Pseudomonas 1/3 bottle - 11/9 more hypotensive central line placed by CCM right internal jugular - 11/17 malfunctioned PEG tube   Assessment & Plan:   Principal Problem:   Sepsis associated hypotension (HCC) Active Problems:   Seizures (HCC)   OBSTRUCTIVE SLEEP APNEA   Essential hypertension   History of expressive aphasia   Pulmonary embolism and infarction (HCC)   PAF (paroxysmal atrial fibrillation) (HCC)   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HCC)   Bedbound     Severe sepsis, POA due to Pseudomonas bacteremia -Recent hospitalization for Pseudomonas UTI. - Bently with 1 blood culture growing Pseudomonas bacteremia -Patient was discharged on Cipro, apparently it resistant to Cipro, continue with Zosyn. - ID input greatly appreciated, will need IV Zosyn for 7  to 10 days, will need surveillance blood cultures in 2 weeks  - Patient keeps spiking intermittent fever despite appropriate antibiotic treatment, only possible access at this point is her TLC, cannot replace PICC line or peripheral IV due to noncompressible veins.  Hypernatremia - Improved.  Continue free water  per tube   Hyponatremia - Mild.  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; will need lifelong suppressive cefadroxil on discharge  CVA 2018 right-sided deficit chronic indwelling PEG tube/Foley catheter -Continues Keppra  1000 twice daily, Depakene  500 3 times daily and 750 at bedtime -mShe is chronically debilitated and has contractures of upper extremities-I am not sure if she will ever be able to be rehabitable - Continue aspirin  and statin   History of seizure disorder -Continue Keppra  and valproic  acid, medication has been transitioned to IV   Malfunctioning PEG tube -IR consulted, plan to exchange tomorrow -Continue with D5 half NS to avoid hypoglycemia as she cannot tolerate any tube feed currently  Hyponatremia -Mild, continue to monitor on IV fluids  Paroxysmal A-fib - Currently rate controlled.  Continue amiodarone  and metoprolol    Essential hypertension--continue metoprolol    Diabetes mellitus type 2  - Overall CBG is controlled, continue to monitor CBG.   GERD -Continue famotidine    Hypokalemia - Replace   Thrombocytopenia - Mild.  At baseline  Difficult IV access - Unfortunately she is having noncompressible peripheral veins, so PIV or PICC line is not an option, so we have to continue with TLC for now (IR PICC line will not be any different as it is still a central access)   Nutrition Dysphagia - Continue jejunostomy tube feeding as per dietitian recommendations.  Continue free  water  per tube   Obesity class I - Outpatient follow-up  Sick sinus syndrome not a candidate for explantation - Is  maintained on monitors as normal sinus rhythm for now  Goals of care - Overall prognosis is poor.    Palliative care consult greatly appreciated,  DSS/family meeting with palliative medicine on 06/10/2024, continue with current measures for now, and to readdress goals of care next week.   - But overall prognosis appears to be very poor, with recurrent infections, for which source cannot be totally controlled, due to chronic indwelling Foley catheter, and presence of AICD, with high risk for not be able to clear recurrent bacteremia due to different sources, with a high risk of readmissions, with dissipated continuous decline.  DVT prophylaxis: (Lovenox ) Code Status: (Full) Family Communication: ( none at bedside ) Disposition:   Status is: Inpatient    Consultants:  Palliative  ID   Subjective: Nonverbal, noncommunicative, peg tube stopped working.  Objective: Vitals:   06/14/24 0336 06/14/24 0400 06/14/24 0500 06/14/24 0802  BP: (!) 147/87   (!) 151/100  Pulse:    84  Resp:    (!) 23  Temp:  98.8 F (37.1 C)  98.7 F (37.1 C)  TempSrc:  Axillary  Axillary  SpO2:  100%  100%  Weight:   99.2 kg   Height:        Intake/Output Summary (Last 24 hours) at 06/14/2024 1101 Last data filed at 06/14/2024 0330 Gross per 24 hour  Intake 784.71 ml  Output 750 ml  Net 34.71 ml   Filed Weights   06/12/24 0457 06/13/24 0516 06/14/24 0500  Weight: 100 kg 100 kg 99.2 kg    Examination:  Significantly debilitated, does not follow any commands, or answer any questions, does not open her eyes Right IJ TLC present, site looks clean Bladder entry bilaterally, PEG present, abdomen soft has trace generalized edema  Data Reviewed: I have personally reviewed following labs and imaging studies  CBC: Recent Labs  Lab 06/08/24 0328 06/09/24 0610 06/10/24 0238 06/11/24 0310 06/12/24 0617  WBC 8.3 8.6 7.5 8.0 6.8  NEUTROABS 4.7 5.0 3.6 3.8 3.5  HGB 10.1* 9.2* 8.8* 8.7* 10.4*   HCT 31.2* 28.7* 27.8* 27.8* 32.8*  MCV 107.6* 110.4* 109.4* 110.8* 109.3*  PLT 124* 106* 98* 90* 89*    Basic Metabolic Panel: Recent Labs  Lab 06/08/24 0328 06/09/24 0610 06/10/24 0238 06/11/24 0310 06/12/24 0617 06/13/24 0630 06/14/24 0741  NA 133* 136 135 138 136 134* 131*  K 3.2* 4.1 4.4 4.8 4.5 4.6 4.4  CL 103 102 104 104 102 100 96*  CO2 25 27 27 28 27 25 27   GLUCOSE 151* 168* 204* 181* 148* 132* 103*  BUN 9 23 24* 25* 25* 23 16  CREATININE 0.66 0.60 0.62 0.72 0.65 0.58 0.64  CALCIUM  7.6* 7.4* 7.4* 7.5* 7.8* 7.7* 7.8*  MG 1.9 2.4  --   --   --   --   --   PHOS 2.0* 1.3*  --   --   --   --   --     GFR: Estimated Creatinine Clearance: 79.9 mL/min (by C-G formula based on SCr of 0.64 mg/dL).  Liver Function Tests: No results for input(s): AST, ALT, ALKPHOS, BILITOT, PROT, ALBUMIN  in the last 168 hours.   CBG: Recent Labs  Lab 06/14/24 0001 06/14/24 0409 06/14/24 0505 06/14/24 0524 06/14/24 0800  GLUCAP 83 57* 62* 85 70     Recent Results (from  the past 240 hours)  Resp panel by RT-PCR (RSV, Flu A&B, Covid)     Status: None   Collection Time: 06/04/24  4:50 PM   Specimen: Nasal Swab  Result Value Ref Range Status   SARS Coronavirus 2 by RT PCR NEGATIVE NEGATIVE Final   Influenza A by PCR NEGATIVE NEGATIVE Final   Influenza B by PCR NEGATIVE NEGATIVE Final    Comment: (NOTE) The Xpert Xpress SARS-CoV-2/FLU/RSV plus assay is intended as an aid in the diagnosis of influenza from Nasopharyngeal swab specimens and should not be used as a sole basis for treatment. Nasal washings and aspirates are unacceptable for Xpert Xpress SARS-CoV-2/FLU/RSV testing.  Fact Sheet for Patients: bloggercourse.com  Fact Sheet for Healthcare Providers: seriousbroker.it  This test is not yet approved or cleared by the [place]  FDA and has been authorized for detection and/or diagnosis of SARS-CoV-2  by FDA under an Emergency Use Authorization (EUA). This EUA will remain in effect (meaning this test can be used) for the duration of the COVID-19 declaration under Section 564(b)(1) of the Act, 21 U.S.C. section 360bbb-3(b)(1), unless the authorization is terminated or revoked.     Resp Syncytial Virus by PCR NEGATIVE NEGATIVE Final    Comment: (NOTE) Fact Sheet for Patients: bloggercourse.com  Fact Sheet for Healthcare Providers: seriousbroker.it  This test is not yet approved or cleared by the [place]  FDA and has been authorized for detection and/or diagnosis of SARS-CoV-2 by FDA under an Emergency Use Authorization (EUA). This EUA will remain in effect (meaning this test can be used) for the duration of the COVID-19 declaration under Section 564(b)(1) of the Act, 21 U.S.C. section 360bbb-3(b)(1), unless the authorization is terminated or revoked.  Performed at [facility], [address], [place], [place] [address]   Urine Culture     Status: None   Collection Time: 06/04/24  4:52 PM   Specimen: Urine, Catheterized  Result Value Ref Range Status   Specimen Description URINE, CATHETERIZED  Final   Special Requests NONE  Final   Culture   Final    NO GROWTH Performed at [facility], [address] [place], [place] [address]    Report Status 06/05/2024 FINAL  Final  Blood Culture (routine x 2)     Status: None   Collection Time: 06/04/24  4:55 PM   Specimen: BLOOD RIGHT ARM  Result Value Ref Range Status   Specimen Description BLOOD RIGHT ARM  Final   Special Requests   Final    BOTTLES DRAWN AEROBIC ONLY Blood Culture results may not be optimal due to an inadequate volume of blood received in culture bottles   Culture   Final    NO GROWTH 5 DAYS Performed at [facility], [address] [place], [place] [address]    Report Status 06/09/2024 FINAL  Final  Blood Culture (routine x 2)      Status: Abnormal   Collection Time: 06/04/24  5:35 PM   Specimen: BLOOD LEFT HAND  Result Value Ref Range Status   Specimen Description BLOOD LEFT HAND  Final   Special Requests   Final    BOTTLES DRAWN AEROBIC AND ANAEROBIC Blood Culture results may not be optimal due to an inadequate volume of blood received in culture bottles   Culture  Setup Time   Final    GRAM NEGATIVE RODS AEROBIC BOTTLE ONLY RBV V  BRYKPHARMD  06/06/2024 @0617  BY DD Performed at [facility],  [address] [place], [place] [address]    Culture PSEUDOMONAS AERUGINOSA (A)  Final   Report Status 06/08/2024 FINAL  Final   Organism ID, Bacteria PSEUDOMONAS AERUGINOSA  Final      Susceptibility   Pseudomonas aeruginosa - MIC*    MEROPENEM <=0.25 SENSITIVE Sensitive     CIPROFLOXACIN 2 RESISTANT Resistant     IMIPENEM 2 SENSITIVE Sensitive     PIP/TAZO Value in next row Sensitive      8 SENSITIVEThis is a modified FDA-approved test that has been validated and its performance characteristics determined by the reporting laboratory.  This laboratory is certified under the Clinical Laboratory Improvement Amendments CLIA as qualified to perform high complexity clinical laboratory testing.    CEFTAZIDIME/AVIBACTAM Value in next row Sensitive      8 SENSITIVEThis is a modified FDA-approved test that has been validated and its performance characteristics determined by the reporting laboratory.  This laboratory is certified under the Clinical Laboratory Improvement Amendments CLIA as qualified to perform high complexity clinical laboratory testing.    CEFTOLOZANE/TAZOBACTAM Value in next row Sensitive      8 SENSITIVEThis is a modified FDA-approved test that has been validated and its performance characteristics determined by the reporting laboratory.  This laboratory is certified under the Clinical Laboratory Improvement Amendments CLIA as qualified to perform high complexity clinical laboratory testing.    TOBRAMYCIN  Value in next row Sensitive      8 SENSITIVEThis is a modified FDA-approved test that has been validated and its performance characteristics determined by the reporting laboratory.  This laboratory is certified under the Clinical Laboratory Improvement Amendments CLIA as qualified to perform high complexity clinical laboratory testing.    CEFTAZIDIME Value in next row Sensitive      8 SENSITIVEThis is a modified FDA-approved test that has been validated and its performance characteristics determined by the reporting laboratory.  This laboratory is certified under the Clinical Laboratory Improvement Amendments CLIA as qualified to perform high complexity clinical laboratory testing.    * PSEUDOMONAS AERUGINOSA  Blood Culture ID Panel (Reflexed)     Status: Abnormal   Collection Time: 06/04/24  5:35 PM  Result Value Ref Range Status   Enterococcus faecalis NOT DETECTED NOT DETECTED Final   Enterococcus Faecium NOT DETECTED NOT DETECTED Final   Listeria monocytogenes NOT DETECTED NOT DETECTED Final   Staphylococcus species NOT DETECTED NOT DETECTED Final   Staphylococcus aureus (BCID) NOT DETECTED NOT DETECTED Final   Staphylococcus epidermidis NOT DETECTED NOT DETECTED Final   Staphylococcus lugdunensis NOT DETECTED NOT DETECTED Final   Streptococcus species NOT DETECTED NOT DETECTED Final   Streptococcus agalactiae NOT DETECTED NOT DETECTED Final   Streptococcus pneumoniae NOT DETECTED NOT DETECTED Final   Streptococcus pyogenes NOT DETECTED NOT DETECTED Final   A.calcoaceticus-baumannii NOT DETECTED NOT DETECTED Final   Bacteroides fragilis NOT DETECTED NOT DETECTED Final   Enterobacterales NOT DETECTED NOT DETECTED Final   Enterobacter cloacae complex NOT DETECTED NOT DETECTED Final   Escherichia coli NOT DETECTED NOT DETECTED Final   Klebsiella aerogenes NOT DETECTED NOT DETECTED Final   Klebsiella oxytoca NOT DETECTED NOT DETECTED Final   Klebsiella pneumoniae NOT DETECTED NOT DETECTED  Final   Proteus species NOT DETECTED NOT DETECTED Final   Salmonella species NOT DETECTED NOT DETECTED Final   Serratia marcescens NOT DETECTED NOT DETECTED Final   Haemophilus influenzae NOT DETECTED NOT DETECTED Final   Neisseria meningitidis NOT DETECTED NOT DETECTED Final   Pseudomonas aeruginosa DETECTED (A)  NOT DETECTED Final    Comment: CRITICAL RESULT CALLED TO, READ BACK BY AND VERIFIED WITH: RBV V  BRYKPHARMD  06/06/2024 @0617  BY DD    Stenotrophomonas maltophilia NOT DETECTED NOT DETECTED Final   Candida albicans NOT DETECTED NOT DETECTED Final   Candida auris NOT DETECTED NOT DETECTED Final   Candida glabrata NOT DETECTED NOT DETECTED Final   Candida krusei NOT DETECTED NOT DETECTED Final   Candida parapsilosis NOT DETECTED NOT DETECTED Final   Candida tropicalis NOT DETECTED NOT DETECTED Final   Cryptococcus neoformans/gattii NOT DETECTED NOT DETECTED Final   CTX-M ESBL NOT DETECTED NOT DETECTED Final   Carbapenem resistance IMP NOT DETECTED NOT DETECTED Final   Carbapenem resistance KPC NOT DETECTED NOT DETECTED Final   Carbapenem resistance NDM NOT DETECTED NOT DETECTED Final   Carbapenem resistance VIM NOT DETECTED NOT DETECTED Final    Comment: Performed at [facility], [address], [place], [place] [address]  Respiratory (~20 pathogens) panel by PCR     Status: None   Collection Time: 06/05/24  2:30 AM   Specimen: Nasopharyngeal Swab; Respiratory  Result Value Ref Range Status   Adenovirus NOT DETECTED NOT DETECTED Final   Coronavirus 229E NOT DETECTED NOT DETECTED Final    Comment: (NOTE) The Coronavirus on the Respiratory Panel, DOES NOT test for the novel  Coronavirus (2019 nCoV)    Coronavirus HKU1 NOT DETECTED NOT DETECTED Final   Coronavirus NL63 NOT DETECTED NOT DETECTED Final   Coronavirus OC43 NOT DETECTED NOT DETECTED Final   Metapneumovirus NOT DETECTED NOT DETECTED Final   Rhinovirus / Enterovirus NOT DETECTED NOT DETECTED Final    Influenza A NOT DETECTED NOT DETECTED Final   Influenza B NOT DETECTED NOT DETECTED Final   Parainfluenza Virus 1 NOT DETECTED NOT DETECTED Final   Parainfluenza Virus 2 NOT DETECTED NOT DETECTED Final   Parainfluenza Virus 3 NOT DETECTED NOT DETECTED Final   Parainfluenza Virus 4 NOT DETECTED NOT DETECTED Final   Respiratory Syncytial Virus NOT DETECTED NOT DETECTED Final   Bordetella pertussis NOT DETECTED NOT DETECTED Final   Bordetella Parapertussis NOT DETECTED NOT DETECTED Final   Chlamydophila pneumoniae NOT DETECTED NOT DETECTED Final   Mycoplasma pneumoniae NOT DETECTED NOT DETECTED Final    Comment: Performed at [facility], [address], [place], [place] [address]         Radiology Studies: No results found.      Scheduled Meds:  amiodarone   200 mg Per Tube Daily   [START ON 06/15/2024] cefadroxil  500 mg Oral Q12H   Chlorhexidine  Gluconate Cloth  6 each Topical Daily   collagenase   Topical Daily   [START ON 06/15/2024] enoxaparin  (LOVENOX ) injection  50 mg Subcutaneous Q24H   estradiol  1 Applicatorful Vaginal Once per day on Tuesday Friday   feeding supplement (PROSource TF20)  60 mL Per Tube Daily   furosemide   10 mg Intravenous Daily   Gerhardt's butt cream   Topical TID   insulin  aspart  0-9 Units Subcutaneous Q4H   levETIRAcetam   1,000 mg Intravenous Q12H   lipase/protease/amylase)  20,880 Units Per Tube Once   And   sodium bicarbonate   650 mg Per Tube Once   nutrition supplement (JUVEN)  1 packet Per Tube BID BM   potassium chloride   20 mEq Oral Daily   simethicone   80 mg Per Tube TID   sodium chloride  flush  10-40 mL Intracatheter Q12H   Continuous Infusions:  dextrose   75 mL/hr at 06/14/24 0737   famotidine  (PEPCID ) IV     feeding supplement (OSMOLITE 1.2 CAL) Stopped (06/13/24 1855)   piperacillin-tazobactam (ZOSYN)  IV 3.375 g (06/14/24 0958)   valproate sodium      valproate sodium        LOS: 8 days      Brayton Lye,  MD [facility]   To contact the attending provider between 7A-7P or the covering provider during after hours 7P-7A, please log into the web site [email or web address] and access using universal [place] password for that web site. If you do not have the password, please call the hospital operator.  06/14/2024, 11:01 AM

## 2024-06-14 NOTE — Progress Notes (Signed)
 [id number] [facility] Liaison Note   This patient is currently enrolled in [facility] outpatient-based palliative care.   Hospital Liaison will continue to follow for discharge disposition.   Please call for any outpatient-based palliative care related questions or concerns.   Thank you Eleanor Nail, LPN [facility] Liaision [phone]

## 2024-06-15 ENCOUNTER — Encounter (HOSPITAL_COMMUNITY): Payer: Self-pay

## 2024-06-15 DIAGNOSIS — A419 Sepsis, unspecified organism: Secondary | ICD-10-CM | POA: Diagnosis not present

## 2024-06-15 DIAGNOSIS — I959 Hypotension, unspecified: Secondary | ICD-10-CM | POA: Diagnosis not present

## 2024-06-15 LAB — GLUCOSE, CAPILLARY
Glucose-Capillary: 167 mg/dL — ABNORMAL HIGH (ref 70–99)
Glucose-Capillary: 121 mg/dL — ABNORMAL HIGH (ref 70–99)
Glucose-Capillary: 158 mg/dL — ABNORMAL HIGH (ref 70–99)
Glucose-Capillary: 166 mg/dL — ABNORMAL HIGH (ref 70–99)

## 2024-06-15 MED ORDER — HYDROCODONE-ACETAMINOPHEN 5-325 MG PO TABS: 1.0000 | ORAL_TABLET | Freq: Two times a day (BID) | ORAL | 0 refills | Status: DC | PRN

## 2024-06-15 MED ORDER — VALPROIC ACID 250 MG/5ML PO SOLN
750.0000 mg | Freq: Every day | ORAL | Status: DC
  Filled 2024-06-15: qty 15

## 2024-06-15 MED ORDER — FAMOTIDINE 20 MG PO TABS: 20.0000 mg | ORAL_TABLET | Freq: Two times a day (BID) | ORAL | Status: DC

## 2024-06-15 MED ORDER — LEVETIRACETAM 100 MG/ML PO SOLN
1000.0000 mg | Freq: Two times a day (BID) | ORAL | Status: DC
  Filled 2024-06-15: qty 10

## 2024-06-15 MED ORDER — VALPROIC ACID 250 MG/5ML PO SOLN
500.0000 mg | Freq: Three times a day (TID) | ORAL | Status: DC
  Administered 2024-06-15: 500 mg
  Filled 2024-06-15 (×2): qty 10

## 2024-06-15 MED ORDER — FUROSEMIDE 20 MG PO TABS: 20.0000 mg | ORAL_TABLET | Freq: Every day | ORAL | Status: DC

## 2024-06-15 NOTE — TOC Transition Note (Signed)
 Transition of Care [facility]) - Discharge Note   Patient Details  Name: Kathleen Jensen MRN: [id number] Date of Birth: Mar 03, 1951  Transition of Care [facility] CM/SW Contact:  Inocente GORMAN Kindle, LCSW Phone Number: 06/15/2024, 2:06 PM   Clinical Narrative:    Patient will DC to: [facility] Anticipated DC date: 06/15/24 Family notified: Legal Sherlynn Iha w/DSS Transport by: [place]   Per MD patient ready for DC to [facility]. RN to call report prior to discharge [phone] room 204A). RN, patient, patient's family, and facility notified of DC. Discharge Summary and FL2 sent to facility. DC packet on chart including singed DNR and script. Ambulance transport requested for patient.   CSW will sign off for now as social work intervention is no longer needed. Please consult us  again if new needs arise.     Final next level of care: Skilled Nursing Facility Barriers to Discharge: Barriers Resolved   Patient Goals and CMS Choice Patient states their goals for this hospitalization and ongoing recovery are:: Return to [facility].gov Compare Post Acute Care list provided to::  (NA) Choice offered to / list presented to : NA [place] ownership interest in [facility].provided to::  (NA)    Discharge Placement   Existing PASRR number confirmed : 06/15/24          Patient chooses bed at: [facility] Patient to be transferred to facility by: [facility] Name of family member notified: Legal Guardian Patient and family notified of of transfer: 06/15/24  Discharge Plan and Services Additional resources added to the After Visit Summary for   In-house Referral: Clinical Social Work   Post Acute Care Choice: Skilled Nursing Facility                               Social Drivers of Health (SDOH) Interventions SDOH Screenings   Food Insecurity: Patient Unable To Answer (05/20/2024)  Housing: Patient Unable To Answer (05/20/2024)  Transportation  Needs: Patient Unable To Answer (03/25/2024)  Utilities: Patient Unable To Answer (03/25/2024)  Depression (PHQ2-9): Low Risk  (05/22/2019)  Social Connections: Unknown (03/25/2024)  Tobacco Use: Medium Risk (05/19/2024)     Readmission Risk Interventions    06/15/2024    9:21 AM 05/31/2024   12:39 PM 05/21/2024    1:03 PM  Readmission Risk Prevention Plan  Transportation Screening Complete Complete Complete  Medication Review Oceanographer) Complete Complete Complete  PCP or Specialist appointment within 3-5 days of discharge Complete Complete Complete  HRI or Home Care Consult Complete Complete Complete  SW Recovery Care/Counseling Consult Complete Complete Complete  Palliative Care Screening Complete Complete Complete  Skilled Nursing Facility Complete Not Applicable Not Applicable

## 2024-06-15 NOTE — Progress Notes (Signed)
 IV team to bedside to DC central line- Pt will need IV meds prior to removing line. RN to re-consult when line can be removed.

## 2024-06-15 NOTE — Discharge Summary (Signed)
 Physician Discharge Summary  Kathleen Jensen FMW:[id number] DOB: 22-Jan-1951 DOA: 06/04/2024  PCP: System, Provider Not In  Admit date: 06/04/2024 Discharge date: 06/15/2024  Admitted From: (SNF) Disposition:  (SNF)  Recommendations for Outpatient Follow-up:  Please ensure follow-up with ID clinic on 06/23/2024  CODE STATUS: DNR at time of discharge, as I have discussed with Kathleen Jensen legal guardian from DSS.  Brief/Interim Summary:  73 y.o. 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  Admitted 11/17 due to sepsis, encephalopathy with workup significant for Pseudomonas bacteremia, she finished 10 days of appropriate IV antibiotics treatment.     Significant events: -  8/8-->04/07/19/2025 patient for MRSA bacteremia/endocarditis, not a candidate for pacemaker explant, will need lifelong cefadroxil . -  10/22-11/3 admitted due to Pseudomonas UTI/Foley catheter  And feeding tube leakage CT showed jejunostomy in place adynamic ileus and treated for a UTI secondary to Pseudomonas palliative care was consulted and palliative/hospice was recommended as an outpatient  - 11/7 admitted for sepsis/encephalopathy .  Significant for Pseudomonas bacteremia Blood culture now growing Pseudomonas 1/3 bottle - 11/9 more hypotensive central line placed by CCM right internal jugular - 11/17 malfunctioned PEG tube, was exchanged by IR ( placement of a new 20 French (up sizing) jejunostomy tube with 8 mL in the retention balloon).    Severe sepsis, POA due to Pseudomonas bacteremia -Recent hospitalization for Pseudomonas UTI. - Bently with 1 blood culture growing Pseudomonas bacteremia -Patient was discharged  on Cipro, apparently it resistant to Cipro, she was treated with Zosyn, finish total 10 days of IV treatments during this hospital stay. - Follow with ID on 06/23/2024 to see if further workup is needed.  Hyponatremia - Improved.  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; will need lifelong suppressive cefadroxil on discharge   CVA 2018 right-sided deficit chronic indwelling PEG tube/Foley catheter -Continues Keppra  1000 twice daily, Depakene  500 3 times daily and 750 at bedtime -mShe is chronically debilitated and has contractures of upper extremities-I am not sure if she will ever be able to be rehabitable - Continue aspirin  and statin   History of seizure disorder -Continue Keppra  and valproic  acid, medication has been transitioned to IV   Malfunctioning PEG tube - Patient with clogged feeding tube 11/16, unsuccessful attempts of declogging by staff and IR, so tube was exchanged by IR ( placement of a new 20 French (up sizing) jejunostomy tube with 8 mL in the retention balloon).   Paroxysmal A-fib - Currently rate controlled.  Continue amiodarone  and metoprolol    Essential hypertension--continue metoprolol    Diabetes mellitus type 2  - Overall CBG is controlled, requiring any insulin    GERD -Continue famotidine    Hypokalemia - Replaced   Thrombocytopenia - Mild.  At baseline   Difficult IV access - Unfortunately she is having noncompressible peripheral veins, so PIV or PICC line is not an option, so we have to continue with TLC during her hospital stay (IR PICC line will not be any different as it is still a central access) -Enterline to be discontinued before discharge   Nutrition Dysphagia - Continue jejunostomy tube feeding as per dietitian recommendations.  Continue free water  per tube   Obesity class I -  Outpatient follow-up   Sick sinus syndrome not a candidate for explantation -  maintained on  monitors as normal sinus rhythm for now   Goals of care - Overall prognosis is poor.    Palliative care consult greatly appreciated,  DSS/family meeting with palliative medicine , overall prognosis appears to be very poor, with recurrent infections, for which source cannot be totally controlled, due to chronic indwelling Foley catheter, and presence of AICD, with high risk for not be able to clear recurrent bacteremia due to different sources, with a high risk of readmissions, with  anticipated continuous decline.  I have discussed with legal guardian [name] at time of discharge, and she confirms patient CODE STATUS currently changed to DNR.    Discharge Diagnoses:  Principal Problem:   Sepsis associated hypotension (HCC) Active Problems:   Seizures (HCC)   OBSTRUCTIVE SLEEP APNEA   Essential hypertension   History of expressive aphasia   Pulmonary embolism and infarction (HCC)   PAF (paroxysmal atrial fibrillation) (HCC)   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HCC)   Bedbound    Discharge Instructions  Discharge Instructions     Discharge wound care:   Complete by: As directed    Wound care  Daily      Comments: Cleanse sacral wound with Vashe, do not rinse and allow to air dry.  Apply 1/4 thick layer of Santyl to wound bed, top with saline moist gauze, dry gauze and silicone foam       Wound care  3 times daily      Comments: Cleanse buttocks with Vashe, apply Gerhardt's Butt Cream 3 times a day and prn soiling   Increase activity slowly   Complete by: As directed       Allergies as of 06/15/2024       Reactions   Tape Rash   Demerol [meperidine Hcl] Nausea And Vomiting   Sulfonamide Derivatives Itching   Bacitracin-polymyxin B Dermatitis   Not listed on the [facility]   Oxycontin  [oxycodone ] Itching        Medication List     STOP taking these medications    furosemide  20 MG tablet Commonly known as: LASIX        TAKE these medications     acetaminophen  160 MG/5ML solution Commonly known as: TYLENOL  Place 20 mLs (640 mg total) into feeding tube every 8 (eight) hours.   acetic acid  0.25 % irrigation 20cc irrigation twice a day, flush foley to prevent clogging   amiodarone  200 MG tablet Commonly known as: PACERONE  Place 1 tablet (200 mg total) into feeding tube daily.   ascorbic acid  500 MG tablet Commonly known as: VITAMIN C  Place 0.5 tablets (250 mg total) into feeding tube 2 (two) times daily.   aspirin  81 MG chewable tablet Place 81 mg into feeding tube at bedtime.   cefadroxil 500 MG/5ML suspension Commonly known as: DURICEF Take 10 mLs (1,000 mg total) by mouth every 12 (twelve) hours.   chlorhexidine  0.12 % solution Commonly known as: PERIDEX  Dab a toothbrush into 15 mLs and brush teeth 2 times a day   Cranberry 425 MG Caps Take 425 mg by mouth in the morning and at bedtime.   cyclobenzaprine  5 MG tablet Commonly known as: FLEXERIL  Place 2.5 mg into feeding tube in the morning and at bedtime. Hold If Sedated   docusate sodium  100 MG capsule Commonly known as: COLACE Give 1 capsule per tube twice a day   estradiol 0.1 MG/GM vaginal cream  Commonly known as: ESTRACE Place vaginally See admin instructions. Apply a pea-sized amount to the urethra/vaginal wall on Tuesdays and Fridays   famotidine  40 MG/5ML suspension Commonly known as: PEPCID  Place 40 mg into feeding tube daily.   feeding supplement (OSMOLITE 1.2 CAL) Liqd Place 1,000 mLs into feeding tube continuous.   feeding supplement (PROSource TF20) liquid Place 60 mLs into feeding tube daily.   folic acid  800 MCG tablet Commonly known as: FOLVITE  Place 800 mcg into feeding tube daily.   free water  Soln Place 200 mLs into feeding tube every 4 (four) hours.   HYDROcodone -acetaminophen  5-325 MG tablet Commonly known as: NORCO/VICODIN Take 1 tablet by mouth 2 (two) times daily as needed (for pain).   levETIRAcetam  100 MG/ML  solution Commonly known as: KEPPRA  Place 1,000 mg into feeding tube 2 (two) times daily.   liver oil-zinc  oxide 40 % ointment Commonly known as: DESITIN Apply topically 2 (two) times daily. Apply Desitin around G tube site BID and change split thickness gauze each time   metoprolol  tartrate 25 MG tablet Commonly known as: LOPRESSOR  Place 1 tablet (25 mg total) into feeding tube 2 (two) times daily.   multivitamin with minerals Tabs tablet Place 1 tablet into feeding tube daily.   nystatin  ointment Commonly known as: MYCOSTATIN  Apply 1 Application topically 3 (three) times daily.   polyethylene glycol 17 g packet Commonly known as: MIRALAX  / GLYCOLAX  Place 34 g into feeding tube daily.   simethicone  80 MG chewable tablet Commonly known as: MYLICON Place 80 mg into feeding tube 3 (three) times daily.   simvastatin  10 MG tablet Commonly known as: ZOCOR  Place 1 tablet (10 mg total) into feeding tube at bedtime.   torsemide  10 MG tablet Commonly known as: DEMADEX  Place 10 mg into feeding tube every 8 (eight) hours. Give 10 mg per tube every 12 hours for edema, hold for a systolic reading less than 110   valproic  acid 250 MG/5ML solution Commonly known as: DEPAKENE  Place 10-15 mLs into feeding tube See admin instructions. 10 ml via tube three times a day and 15 ml at bedtime   Valtoco  20 MG Dose 10 MG/0.1ML Lqpk Generic drug: diazePAM  (20 MG Dose) Place 20 mg into the nose daily as needed (seizures).               Discharge Care Instructions  (From admission, onward)           Start     Ordered   06/15/24 0000  Discharge wound care:       Comments: Wound care  Daily      Comments: Cleanse sacral wound with Vashe, do not rinse and allow to air dry.  Apply 1/4 thick layer of Santyl to wound bed, top with saline moist gauze, dry gauze and silicone foam       Wound care  3 times daily      Comments: Cleanse buttocks with Vashe, apply Gerhardt's Butt Cream 3  times a day and prn soiling   06/15/24 0845            Allergies  Allergen Reactions   Tape Rash   Demerol [Meperidine Hcl] Nausea And Vomiting   Sulfonamide Derivatives Itching   Bacitracin-Polymyxin B Dermatitis    Not listed on the [facility]   Oxycontin  [Oxycodone ] Itching    Consultations: Palliative medicine Infectious disease   Procedures/Studies: IR Replc Duoden/Jejuno Tube Percut W/Fluoro Result Date: 06/14/2024 INDICATION: Occluded J-tube. EXAM: Jejunostomy tube exchange CONTRAST:  -  administered into the gastric lumen. FLUOROSCOPY: Radiation Exposure Index (as provided by the fluoroscopic device): 8 mGy Kerma COMPLICATIONS: None immediate. PROCEDURE: Informed written consent was obtained from the patient's POA after a thorough discussion of the procedural risks, benefits and alternatives. All questions were addressed. Maximal Sterile Barrier Technique was utilized including caps, mask, sterile gowns, sterile gloves, sterile drape, hand hygiene and skin antiseptic. A timeout was performed prior to the initiation of the procedure. In a supine position the left lower quadrant was prepped draped in usual sterile fashion. Dilute contrast was injected to the jejunostomy tube and would not completely flow through the midpoint occlusion within the jejunostomy tube which was well-visualized under the fluoroscopic injection. A stiff Glidewire was then advanced through the catheter under fluoroscopic guidance until the guidewire was within the distal jejunum. The inflation/retention balloon was deflated and the catheter was removed over the guidewire. A new 20 French jejunostomy tube was then prepped at the table and advanced over the guidewire under fluoroscopic guidance until the tube was completely within the jejunum and then 8 mL of normal saline was injected into the balloon. The [facility] disc was closely applied to the skin. Guidewire was removed and dilute contrast was injected through the  jejunostomy tube demonstrating satisfactory position and good flow. The catheter was then flushed with normal saline. IMPRESSION: Satisfactory placement of a new 20 French (up sizing) jejunostomy tube with 8 mL in the retention balloon. Electronically Signed   By: Cordella Banner   On: 06/14/2024 17:16   DG CHEST PORT 1 VIEW Result Date: 06/06/2024 EXAM: 1 VIEW(S) XRAY OF THE CHEST 06/06/2024 05:56:00 PM COMPARISON: None available. CLINICAL HISTORY: Encounter for central line placement [id number] FINDINGS: LINES, TUBES AND DEVICES: Right internal jugular central venous catheter in place with tip terminating over the superior cavoatrial junction. Left chest cardiac pacing device noted. LUNGS AND PLEURA: Elevated right hemidiaphragm. Left basilar linear opacities consistent with atelectasis. No focal pulmonary opacity. No pulmonary edema. No pleural effusion. No pneumothorax. HEART AND MEDIASTINUM: Tortuous thoracic aorta. Atherosclerotic calcifications. No acute abnormality of the cardiac and mediastinal silhouettes. BONES AND SOFT TISSUES: Right upper quadrant clips noted. No acute osseous abnormality. IMPRESSION: 1. Right internal jugular central venous catheter in place with tip terminating over the superior cavoatrial junction. 2. Left basilar linear opacities consistent with atelectasis. 3. Elevated right hemidiaphragm. Electronically signed by: Norman Gatlin MD 06/06/2024 06:25 PM EST RP Workstation: [id number]   US  EKG SITE RITE Result Date: 06/06/2024 If Site Rite image not attached, placement could not be confirmed due to current cardiac rhythm.  CT CHEST ABDOMEN PELVIS WO CONTRAST Result Date: 06/04/2024 EXAM: CT CHEST, ABDOMEN AND PELVIS WITHOUT CONTRAST 06/04/2024 07:26:13 PM TECHNIQUE: CT of the chest, abdomen and pelvis was performed without the administration of intravenous contrast. Multiplanar reformatted images are provided for review. Automated exposure control, iterative reconstruction,  and/or weight based adjustment of the mA/kV was utilized to reduce the radiation dose to as low as reasonably achievable. COMPARISON: CT abdomen and pelvis 05/19/2024. CLINICAL HISTORY: Peg tube in place. AMS. Sepsis. eval for any evidence of source. FINDINGS: CHEST: MEDIASTINUM AND LYMPH NODES: Left-sided pacemaker is present. There are atherosclerotic calcifications of the aorta and coronary arteries. The central airways are clear. No mediastinal, hilar or axillary lymphadenopathy. LUNGS AND PLEURA: There is a 2 mm nodule in the left upper lobemage 5/61. There is a trace left pleural effusion. No pneumothorax. No pulmonary edema. ABDOMEN AND PELVIS: LIVER: The liver is unremarkable. GALLBLADDER AND  BILE DUCTS: Gallbladder is surgically absent. No biliary ductal dilatation. SPLEEN: No acute abnormality. PANCREAS: No acute abnormality. ADRENAL GLANDS: No acute abnormality. KIDNEYS, URETERS AND BLADDER: Complex cystic lesion measuring 3.6 cm in the superior pole of left kidney is grossly unchanged, but not well evaluated secondary to artifact from patient's arm positioning. The foley catheter decompresses the bladder. No stones in the kidneys or ureters. No hydronephrosis. No perinephric or periureteral stranding. 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 New punctate radiopaque density in the region of the ascending colon, indeterminate. There is no bowel obstruction. Normal appendix. REPRODUCTIVE ORGANS: No acute abnormality. PERITONEUM AND RETROPERITONEUM: Mild stable presacral edema. No ascites. No free air. VASCULATURE: Aorta is normal in caliber. There are atherosclerotic calcifications of the aorta. ABDOMINAL AND PELVIS LYMPH NODES: No lymphadenopathy. BONES AND SOFT TISSUES: The bones are diffusely osteopenic. Lower lumbar fusion hardware appears to be complicated. No focal soft tissue abnormality. IMPRESSION: 1. No acute findings. 2. Trace left pleural effusion. 3. New  punctate radiopaque density in the region of the ascending colon, indeterminate. Correlate clinically for foreign bodies surgical history 4. Atherosclerotic calcifications of the aorta and coronary arteries. 5. Complex cystic lesion in the superior pole of the left kidney measuring 3.6 cm, grossly unchanged but inadequately evaluated due to artifact; recommend prompt renal protocol MRI or CT without and with contrast for characterization. Electronically signed by: Greig Pique MD 06/04/2024 08:08 PM EST RP Workstation: [id number]   CT Head Wo Contrast Result Date: 06/04/2024 EXAM: CT HEAD WITHOUT CONTRAST 06/04/2024 07:25:41 PM TECHNIQUE: CT of the head was performed without the administration of intravenous contrast. Automated exposure control, iterative reconstruction, and/or weight based adjustment of the mA/kV was utilized to reduce the radiation dose to as low as reasonably achievable. COMPARISON: 05/19/2024 CLINICAL HISTORY: ams FINDINGS: BRAIN AND VENTRICLES: No acute hemorrhage. No evidence of acute infarct. 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 Chronic right thalamic infarct. Moderate to severe chronic microvascular ischemic change. Generalized volume loss. ORBITS: No acute abnormality. SINUSES: No acute abnormality. SOFT TISSUES AND SKULL: No acute soft tissue abnormality. No skull fracture. IMPRESSION: 1. No acute intracranial abnormality. 2. 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 3. Moderate to severe chronic microvascular ischemic change and generalized volume loss. Electronically signed by: Oneil Devonshire MD 06/04/2024 07:55 PM EST RP Workstation: [id number]   DG Chest Port 1 View Result Date: 06/04/2024 EXAM: 1 VIEW(S) XRAY OF THE CHEST 06/04/2024 05:54:00 PM COMPARISON: 05/19/2024 CLINICAL HISTORY: Questionable sepsis - evaluate for abnormality FINDINGS: LUNGS  AND PLEURA: Low lung volume with elevated right hemidiaphragm. No focal pulmonary opacity. No pulmonary edema. No pleural effusion. No pneumothorax. HEART AND MEDIASTINUM: Aortic atherosclerosis. 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 No acute abnormality of the cardiac and mediastinal silhouettes. BONES AND SOFT TISSUES: No acute osseous abnormality. IMPRESSION: 1. No acute cardiopulmonary process identified. 2. Low lung volume with elevated right hemidiaphragm. Electronically signed by: Rockey Kilts MD 06/04/2024 06:27 PM EST RP Workstation: [id number]   DG Abd 1 View Result Date: 05/27/2024 EXAM: 1 VIEW XRAY OF THE ABDOMEN 05/27/2024 10:09:00 AM COMPARISON: CT 10 / 22 / 25 CLINICAL HISTORY: Abdominal pain FINDINGS: LINES, TUBES AND DEVICES: Catheter tubing projecting over left abdomen. BOWEL: Diffusely gas-filled, dilated loops of bowel throughout central abdomen, mainly reflecting large bowel, consistent with ileus. SOFT TISSUES: Fibroid calcifications in pelvis. No opaque  urinary calculi. BONES: Lumbosacral fusion hardware present. IMPRESSION: 1. Dilated loops of large bowel throughout the central abdomen similar to prior CT and suggestive of chronic adynamic ileus. Electronically signed by: Norman Gatlin MD 05/27/2024 01:56 PM EDT RP Workstation: [id number]   CT ABDOMEN PELVIS W CONTRAST Result Date: 05/19/2024 CLINICAL DATA:  Leaking gastrostomy tube, infection around G-tube site, abdominal pain EXAM: CT ABDOMEN AND PELVIS WITH CONTRAST TECHNIQUE: Multidetector CT imaging of the abdomen and pelvis was performed using the standard protocol following bolus administration of intravenous contrast. RADIATION DOSE REDUCTION: This exam was performed according to the departmental dose-optimization program which includes automated exposure control, adjustment of the mA and/or kV according to patient size and/or use of iterative reconstruction technique. CONTRAST:  OMNIPAQUE  IOHEXOL  300  MG/ML  SOLN COMPARISON:  03/24/2024 FINDINGS: Lower chest: No acute pleural or parenchymal lung disease. Dual lead cardiac pacer. Hepatobiliary: No focal liver abnormality is seen. Status post cholecystectomy. No biliary dilatation. Pancreas: Unremarkable. No pancreatic ductal dilatation or surrounding inflammatory changes. Spleen: Normal in size without focal abnormality. Adrenals/Urinary Tract: There is a 4.3 x 4.0 x 4.8 cm complex cyst upper pole left kidney, with peripheral 1.8 x 1.2 x 1.3 cm nodularity. The peripheral nodularity is increased in prominence since prior exams, and further evaluation with nonemergent outpatient dedicated renal CT is recommended if the patient would be a therapy candidate should neoplasm be detected. Otherwise the kidneys are unremarkable. No urinary tract calculi or obstructive uropathy. The adrenals appear normal. The bladder is decompressed with a Foley catheter in place. Stomach/Bowel: No bowel obstruction or ileus. There is chronic gaseous distension of the colon likely reflecting chronic adynamic colonic ileus. Multiple clips within the cecum. Normal appendix. Postsurgical changes from bariatric surgery. Stable position of the percutaneous change in ostomy tube, tip within the mid jejunum. No acute findings within the abdominal wall surrounding the jejunostomy catheter site. Vascular/Lymphatic: Aortic atherosclerosis. No enlarged abdominal or pelvic lymph nodes. Reproductive: Stable fibroid uterus.  No adnexal masses. Other: No free fluid or free intraperitoneal gas. No abdominal wall hernia. Musculoskeletal: No acute or destructive bony abnormalities. Stable postsurgical changes within the lower lumbar spine. Reconstructed images demonstrate no additional findings. IMPRESSION: 1. Stable percutaneous jejunostomy tube, without evidence of complication. 2. Chronic gaseous distention of the colon, likely representing chronic adynamic colonic ileus. 3. Complex upper pole left  renal cyst, with peripheral nodularity. Further evaluation with nonemergent outpatient dedicated renal CT is recommended if the patient would be a therapy candidate should neoplasm be detected. 4.  Aortic Atherosclerosis (ICD10-I70.0). Electronically Signed   By: Ozell Daring M.D.   On: 05/19/2024 16:59   CT Head Wo Contrast Result Date: 05/19/2024 EXAM: CT HEAD WITHOUT CONTRAST 05/19/2024 04:46:55 PM TECHNIQUE: CT of the head was performed without the administration of intravenous contrast. Automated exposure control, iterative reconstruction, and/or weight based adjustment of the mA/kV was utilized to reduce the radiation dose to as low as reasonably achievable. COMPARISON: Comparison with 07/14/2023. CLINICAL HISTORY: Mental status change, unknown cause. Leaking G-tube, infected around site (staff noticed today). CBG 162. Blood pressure 120/80-90. Oxygen saturation 88% on room air, 98% on 6 liters. Answers questions by nodding head. Decline in mental status. FINDINGS: BRAIN AND VENTRICLES: No acute hemorrhage. No evidence of acute infarct. Chronic microvascular ischemia and generalized atrophy. Chronic left MCA territory infarct. Chronic right thalamic infarct. Chronic left cerebellar infarcts. No hydrocephalus. No extra-axial collection. No mass effect or midline shift. ORBITS: No acute abnormality. SINUSES: No acute abnormality. SOFT  TISSUES AND SKULL: No acute soft tissue abnormality. No skull fracture. IMPRESSION: 1. No acute intracranial abnormality. Electronically signed by: Norman Gatlin MD 05/19/2024 04:54 PM EDT RP Workstation: [id number]   DG Chest Port 1 View Result Date: 05/19/2024 EXAM: 1 VIEW(S) XRAY OF THE CHEST 05/19/2024 01:56:39 PM COMPARISON: 06/29/2023 CLINICAL HISTORY: Questionable sepsis - evaluate for abnormality. FINDINGS: LUNGS AND PLEURA: Low lung volumes. Elevated right hemidiaphragm, unchanged. No focal pulmonary opacity. No pulmonary edema. No pleural effusion. No  pneumothorax. HEART AND MEDIASTINUM: Aortic atherosclerosis. Left pacemaker in place. No acute abnormality of the cardiac and mediastinal silhouettes. BONES AND SOFT TISSUES: No acute osseous abnormality. IMPRESSION: 1. No acute cardiopulmonary process identified. Electronically signed by: Waddell Calk MD 05/19/2024 02:25 PM EDT RP Workstation: [id number]   (Echo, Carotid, EGD, Colonoscopy, ERCP)    Subjective: He is nonverbal, noncommunicative, does not follow any commands or answer any questions Discharge Exam: Vitals:   06/15/24 0400 06/15/24 0746  BP: 127/82 120/78  Pulse: 67 66  Resp: (!) 22 (!) 24  Temp: 97.7 F (36.5 C) (!) 97.4 F (36.3 C)  SpO2: 100% 100%   Vitals:   06/14/24 2000 06/15/24 0400 06/15/24 0500 06/15/24 0746  BP: 124/69 127/82  120/78  Pulse: 97 67  66  Resp: (!) 22 (!) 22  (!) 24  Temp: 98.8 F (37.1 C) 97.7 F (36.5 C)  (!) 97.4 F (36.3 C)  TempSrc: Axillary Axillary  Axillary  SpO2: 100% 100%  100%  Weight:   100 kg   Height:        Significantly debilitated, does not follow any commands, or answer any questions, does not open her eyes Good air  entry bilaterally, RRR PEG present, abdomen soft has trace generalized edema    The results of significant diagnostics from this hospitalization (including imaging, microbiology, ancillary and laboratory) are listed below for reference.     Microbiology: No results found for this or any previous visit (from the past 240 hours).   Labs: BNP (last 3 results) No results for input(s): BNP in the last 8760 hours. Basic Metabolic Panel: Recent Labs  Lab 06/09/24 0610 06/10/24 0238 06/11/24 0310 06/12/24 0617 06/13/24 0630 06/14/24 0741  NA 136 135 138 136 134* 131*  K 4.1 4.4 4.8 4.5 4.6 4.4  CL 102 104 104 102 100 96*  CO2 27 27 28 27 25 27   GLUCOSE 168* 204* 181* 148* 132* 103*  BUN 23 24* 25* 25* 23 16  CREATININE 0.60 0.62 0.72 0.65 0.58 0.64  CALCIUM  7.4* 7.4* 7.5* 7.8* 7.7* 7.8*   MG 2.4  --   --   --   --   --   PHOS 1.3*  --   --   --   --   --    Liver Function Tests: No results for input(s): AST, ALT, ALKPHOS, BILITOT, PROT, ALBUMIN  in the last 168 hours. No results for input(s): LIPASE, AMYLASE in the last 168 hours. No results for input(s): AMMONIA in the last 168 hours. CBC: Recent Labs  Lab 06/09/24 0610 06/10/24 0238 06/11/24 0310 06/12/24 0617  WBC 8.6 7.5 8.0 6.8  NEUTROABS 5.0 3.6 3.8 3.5  HGB 9.2* 8.8* 8.7* 10.4*  HCT 28.7* 27.8* 27.8* 32.8*  MCV 110.4* 109.4* 110.8* 109.3*  PLT 106* 98* 90* 89*   Cardiac Enzymes: No results for input(s): CKTOTAL, CKMB, CKMBINDEX, TROPONINI in the last 168 hours. BNP: Invalid input(s): POCBNP CBG: Recent Labs  Lab 06/14/24 1558 06/14/24 2058 06/14/24 2316  06/15/24 0441 06/15/24 0745  GLUCAP 103* 191* 164* 167* 121*   D-Dimer No results for input(s): DDIMER in the last 72 hours. Hgb A1c No results for input(s): HGBA1C in the last 72 hours. Lipid Profile No results for input(s): CHOL, HDL, LDLCALC, TRIG, CHOLHDL, LDLDIRECT in the last 72 hours. Thyroid  function studies No results for input(s): TSH, T4TOTAL, T3FREE, THYROIDAB in the last 72 hours.  Invalid input(s): FREET3 Anemia work up No results for input(s): VITAMINB12, FOLATE, FERRITIN, TIBC, IRON, RETICCTPCT in the last 72 hours. Urinalysis    Component Value Date/Time   COLORURINE YELLOW 06/04/2024 1652   APPEARANCEUR CLEAR 06/04/2024 1652   LABSPEC 1.015 06/04/2024 1652   PHURINE 6.0 06/04/2024 1652   GLUCOSEU NEGATIVE 06/04/2024 1652   HGBUR NEGATIVE 06/04/2024 1652   BILIRUBINUR NEGATIVE 06/04/2024 1652   BILIRUBINUR 1+ 11/19/2016 1307   KETONESUR NEGATIVE 06/04/2024 1652   PROTEINUR NEGATIVE 06/04/2024 1652   UROBILINOGEN 1.0 11/19/2016 1307   NITRITE NEGATIVE 06/04/2024 1652   LEUKOCYTESUR TRACE (A) 06/04/2024 1652   Sepsis Labs Recent Labs  Lab  06/09/24 0610 06/10/24 0238 06/11/24 0310 06/12/24 0617  WBC 8.6 7.5 8.0 6.8   Microbiology No results found for this or any previous visit (from the past 240 hours).   Time coordinating discharge: Over 30 minutes  SIGNED:   Brayton Lye, MD  [facility] 06/15/2024, 9:19 AM Pager   If 7PM-7AM, please contact night-coverage [email or web address]

## 2024-06-15 NOTE — TOC Progression Note (Addendum)
 Transition of Care [facility]) - Progression Note    Patient Details  Name: Kathleen Jensen MRN: [id number] Date of Birth: May 09, 1951  [facility] CM/SW Contact  Inocente GORMAN Kindle, LCSW Phone Number: 06/15/2024, 9:22 AM  Clinical Narrative:    CSW updated [place] of discharge today as well as legal guardian, [name]. She reported they have changed patient to a DNR and she sent that paperwork over to [employer]. She reported they will resume outpatient palliative care with [facility]. CSW updated [facility] on discharge today.    Per RN, awaiting CV removal prior to PTAR.    Expected Discharge Plan: Long Term Nursing Home Barriers to Discharge: Barriers Resolved               Expected Discharge Plan and Services In-house Referral: Clinical Social Work   Post Acute Care Choice: Skilled Nursing Facility Living arrangements for the past 2 months: Skilled Nursing Facility Expected Discharge Date: 06/15/24                                     Social Drivers of Health (SDOH) Interventions SDOH Screenings   Food Insecurity: Patient Unable To Answer (05/20/2024)  Housing: Patient Unable To Answer (05/20/2024)  Transportation Needs: Patient Unable To Answer (03/25/2024)  Utilities: Patient Unable To Answer (03/25/2024)  Depression (PHQ2-9): Low Risk  (05/22/2019)  Social Connections: Unknown (03/25/2024)  Tobacco Use: Medium Risk (05/19/2024)    Readmission Risk Interventions    06/15/2024    9:21 AM 05/31/2024   12:39 PM 05/21/2024    1:03 PM  Readmission Risk Prevention Plan  Transportation Screening Complete Complete Complete  Medication Review Oceanographer) Complete Complete Complete  PCP or Specialist appointment within 3-5 days of discharge Complete Complete Complete  HRI or Home Care Consult Complete Complete Complete  SW Recovery Care/Counseling Consult Complete Complete Complete  Palliative Care Screening Complete Complete Complete   Skilled Nursing Facility Complete Not Applicable Not Applicable

## 2024-06-15 NOTE — Progress Notes (Signed)
 Pt. Noted to have green drainage to PEG tube site. Dr. Sherlon made aware.

## 2024-06-15 NOTE — Progress Notes (Signed)
 Nutrition Brief Note  Patient slated to discharge to [facility] today. Noted J-tube clogged on 11/16. Was upsized from 18Fr to 20Fr on 11/17. Tube feedings re-initiated yesterday afternoon and patient tolerating well.  Likelihood of readmission d/t infection is high. MD has discussed this with legal guardian and family who are considering comfort focused approach either at facility or at home. She will require lifelong suppressive cefadroxil.    INTERVENTION:  - Continue tube feeding via J-tube: Osmolite 1.2 at 60 ml/h (1440 ml per day) Prosource TF20 60 ml daily Provides 1808 kcal, 100 gm protein, 1181 ml free water  daily   -Monitor GOC discussion and modify POC as indicated   -Continue Juven BID for wound healing support     NUTRITION DIAGNOSIS:  Inadequate oral intake related to inability to eat as evidenced by NPO status.   GOAL:  Patient will meet greater than or equal to 90% of their needs  Blair Deaner MS, RD, LDN Registered Dietitian Clinical Nutrition RD Inpatient Contact Info in Amion

## 2024-06-15 NOTE — Plan of Care (Signed)
  Problem: Activity: Goal: Risk for activity intolerance will decrease Outcome: Progressing   Problem: Nutrition: Goal: Adequate nutrition will be maintained Outcome: Progressing   Problem: Coping: Goal: Level of anxiety will decrease Outcome: Progressing   Problem: Pain Managment: Goal: General experience of comfort will improve and/or be controlled Outcome: Progressing

## 2024-06-15 NOTE — Progress Notes (Signed)
  Daily Progress Note   Patient Name: Kathleen Jensen       Date: 06/15/2024 DOB: Apr 11, 1951  Age: 73 y.o. MRN#: [id number] Attending Physician: Sherlon Brayton RAMAN, MD Primary Care Physician: System, Provider Not In Admit Date: 06/04/2024 Length of Stay: 9 days  Reason for Consultation/Follow-up: Establishing goals of care  Subjective:   Subjective: EMR reviewed including notes from [facility] and hospitalist.    Discussed with primary hospitalist.  Transition back to skilled facility today.  I saw and examined Kathleen Jensen.  She remains minimally responsive and does not meaningfully interact.  Reviewed with DSS legal guardian, [name] Search.  Received paperwork today with approval from DSS as legal guardian to transition to DO NOT RESUSCITATE in the event of natural death.  Paperwork reviewed in detail.  Discussed with primary hospitalist.  Order placed today for DNR and yellow form placed on chart in anticipation of discharge.    Review of Systems Unable to obtain  Objective:   Vital Signs:  BP 120/78 (BP Location: Right Wrist)   Pulse 66   Temp (!) 97.4 F (36.3 C) (Axillary)   Resp (!) 24   Ht 5' 10 (1.778 m)   Wt 100 kg   SpO2 100%   BMI 31.63 kg/m   Physical Exam: General: Chronically ill-appearing black female with contracture right upper extremity and bilateral swelling Cardiovascular: Regular rate and rhythm, no murmur appreciated Lungs: Diminished bilaterally Abdomen: Distended, PEG tube in place Extremities: +1 edema bilaterally in lower extremity Neuro: Tracks in room but unable to converse and does not follow commands.  Imaging: @IMAGES @  I personally reviewed recent imaging.   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  - CODE STATUS updated today and copy of approval from DSS placed on chart in addition to DNR form.  - Plan for discharge back to facility today.  Recommend outpatient palliative care to follow   -  Code Status: Limited: Do not attempt resuscitation (DNR) -DNR-LIMITED -Do Not Intubate/DNI   Prognosis: Unable to determine  # Symptom management: Patient is receiving these palliative interventions for symptom management with an intent to improve quality of life.   - Pain: Acetaminophen  as needed  - Nausea: Zofran  as needed  # Psychosocial Support:  -- Support [place] She has 3 daughters who are involved and visit her regularly. - Desire for further Chaplain support:no  # Discharge Planning: To Be Determined  -  Discussed with: bedside staff  Thank you for allowing the palliative care team to participate in the care Kathleen Jensen.  Amaryllis Meissner, MD Palliative Care Provider PMT # [phone]  If patient remains symptomatic despite maximum doses, please call PMT at [phone] between 0700 and 1900. Outside of these hours, please call attending, as PMT does not have night coverage.  I personally spent a total of 40 minutes in the care of the patient today including preparing to see the patient, performing a medically appropriate exam/evaluation, placing orders, referring and communicating with other health care professionals, documenting clinical information in the EHR, and coordinating care.

## 2024-06-15 NOTE — Progress Notes (Signed)
 Called report to [place], LPN at [facility].

## 2024-06-15 NOTE — NC FL2 (Signed)
 [place]  MEDICAID FL2 LEVEL OF CARE FORM     IDENTIFICATION  Patient Name: Kathleen Jensen Birthdate: Jan 27, 1951 Sex: female Admission Date (Current Location): 06/04/2024  [facility] and [place] Number:  Producer, Television/film/video and Address:  [facility], [address] [place], [place] [address]      Provider Number: [id number]  Attending Physician Name and Address:  Elgergawy, Brayton RAMAN, MD  Relative Name and Phone Number:       Current Level of Care: Hospital Recommended Level of Care: Nursing Facility Prior Approval Number:    Date Approved/Denied:   PASRR Number:    Discharge Plan: SNF    Current Diagnoses: Patient Active Problem List   Diagnosis Date Noted   Palliative care encounter 05/21/2024   Goals of care, counseling/discussion 05/21/2024   Counseling and coordination of care 05/21/2024   Need for emotional support 05/21/2024   Bedbound 05/21/2024   Complication of feeding tube (HCC) 05/21/2024   Sepsis associated hypotension (HCC) 05/20/2024   Elevated lactic acid level 05/19/2024   Hypoglycemia 04/01/2024   Infection and inflammatory reaction due to cardiac device, implant, and graft 04/01/2024   Hypocalcemia 03/31/2024   PEG tube malfunction (HCC) 03/31/2024   Bacteremia 03/25/2024   Chronic health problem 03/25/2024   Ileus (HCC) 06/06/2023   Acute cystitis 03/01/2023   Delirium secondary to UTI 03/01/2023   Atrial fibrillation, chronic (HCC) 03/01/2023   History of seizure 03/01/2023   Chronic hyponatremia 03/01/2023   Acute respiratory failure (HCC) 11/16/2022   Abdominal distension 11/16/2022   AMS (altered mental status) 05/17/2022   Altered mental status 05/15/2022   Sacral wound 05/15/2022   Intracranial hemorrhage (HCC)    Pressure injury of skin 02/18/2022   Chronic indwelling Foley catheter 02/18/2022   PAF (paroxysmal atrial fibrillation) (HCC) 02/18/2022   PEG (percutaneous endoscopic gastrostomy) status  (HCC) 02/18/2022   Septic shock (HCC) 02/15/2022   Constipation    Abdominal pain    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   History of right hemiplegia (HCC) 04/12/2020   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   Hyperlipidemia associated with type 2 diabetes mellitus (HCC) 07/30/2018   History of CVA (cerebrovascular accident) 07/30/2018   Vitamin B 12 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/30/2018   Asthma 03/13/2018   Seizures (HCC) 01/12/2018   CVA (cerebral vascular accident) (HCC) 12/08/2017   Abnormal thyroid  blood test 12/08/2017   Pulmonary embolism and infarction (HCC) 12/08/2017   Chronic anemia 12/08/2017   History of expressive aphasia 10/15/2017   Seizure disorder (HCC) 10/15/2017   Dysphagia 10/15/2017   Hyperlipidemia 09/22/2017   Urinary frequency 09/22/2017   Preoperative clearance 11/19/2016   Airway hyperreactivity 01/26/2016   Disease of thyroid  gland 01/26/2016   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   Other specified postprocedural states 01/26/2016   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   Chronic low back pain 04/10/2015   Degeneration of intervertebral disc of lumbar region 04/10/2015   Spondylolisthesis of lumbar region 04/10/2015   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   History of cardiac pacemaker in situ 01/09/2015   B12 DEFICIENCY 08/15/2008   Vitamin D  deficiency 08/15/2008   Sick sinus syndrome (HCC) 08/15/2008   OBSTRUCTIVE SLEEP APNEA 08/11/2008   INSOMNIA 04/28/2008   Hyperlipidemia LDL goal <70 12/12/2006   RHINITIS, ALLERGIC NEC 12/12/2006   Hypothyroidism 12/11/2006   Essential hypertension 12/11/2006   GERD 12/11/2006   STRESS INCONTINENCE 12/11/2006    Orientation RESPIRATION BLADDER Height & Weight      (  unable to follow commands)  O2 (2L nasal cannula) Incontinent, Indwelling catheter Weight: 220 lb 7.4 oz (100 kg) Height:  5' 10 (177.8 cm)  BEHAVIORAL SYMPTOMS/MOOD NEUROLOGICAL BOWEL NUTRITION STATUS      Incontinent  Feeding tube  AMBULATORY STATUS COMMUNICATION OF NEEDS Skin   Total Care Verbally PU Stage and Appropriate Care, Other (Comment) (wound on abdomen, breast; unstageable on coccyx)                       Personal Care Assistance Level of Assistance  Bathing, Feeding, Dressing Bathing Assistance: Maximum assistance Feeding assistance: Maximum assistance Dressing Assistance: Maximum assistance     Functional Limitations Info  Speech     Speech Info: Impaired    SPECIAL CARE FACTORS FREQUENCY                       Contractures Contractures Info: Not present    Additional Factors Info  Code Status, Allergies Code Status Info: DNR-limited Allergies Info: Tape, Demerol (Meperidine Hcl), Sulfonamide Derivatives, Bacitracin-polymyxin B, Oxycontin  (Oxycodone )           Current Medications (06/15/2024):  This is the current hospital active medication list Current Facility-Administered Medications  Medication Dose Route Frequency Provider Last Rate Last Admin   acetaminophen  (TYLENOL ) tablet 500 mg  500 mg Per J Tube Q6H Elgergawy, Brayton RAMAN, MD   500 mg at 06/15/24 0535   Or   acetaminophen  (TYLENOL ) suppository 650 mg  650 mg Rectal Q6H Elgergawy, Brayton RAMAN, MD       amiodarone  (PACERONE ) tablet 200 mg  200 mg Per Tube Daily Claiborne, Claudia, MD   200 mg at 06/13/24 9070   cefadroxil (DURICEF) 500 MG/5ML suspension 500 mg  500 mg Oral Q12H Vu, Trung T, MD       Chlorhexidine  Gluconate Cloth 2 % PADS 6 each  6 each Topical Daily Sigdel, Santosh, MD   6 each at 06/14/24 1025   collagenase (SANTYL) ointment   Topical Daily Sigdel, Derryl, MD   Given by Other at 06/14/24 0515   enoxaparin  (LOVENOX ) injection 50 mg  50 mg Subcutaneous Q24H Reome, Earle J, RPH       estradiol (ESTRACE) 0.01 % vaginal cream 1 Applicatorful  1 Applicatorful Vaginal Once per day on Tuesday Friday Samtani, Jai-Gurmukh, MD   1 Applicatorful at 06/11/24 1013   famotidine  (PEPCID ) IVPB 20 mg premix   20 mg Intravenous Q12H Elgergawy, Dawood S, MD 100 mL/hr at 06/14/24 2112 20 mg at 06/14/24 2112   feeding supplement (OSMOLITE 1.2 CAL) liquid 1,000 mL  1,000 mL Per Tube Continuous Samtani, Jai-Gurmukh, MD 60 mL/hr at 06/14/24 1532 1,000 mL at 06/14/24 1532   feeding supplement (PROSource TF20) liquid 60 mL  60 mL Per Tube Daily Samtani, Jai-Gurmukh, MD   60 mL at 06/13/24 9071   furosemide  (LASIX ) injection 10 mg  10 mg Intravenous Daily Elgergawy, Dawood S, MD   10 mg at 06/14/24 1106   Gerhardt's butt cream   Topical TID Mcarthur Derryl, MD   Given at 06/14/24 2115   insulin  aspart (novoLOG ) injection 0-9 Units  0-9 Units Subcutaneous Q4H Elgergawy, Dawood S, MD   2 Units at 06/15/24 0454   levETIRAcetam  (KEPPRA ) undiluted injection 1,000 mg  1,000 mg Intravenous Q12H Elgergawy, Dawood S, MD   1,000 mg at 06/14/24 2322   lidocaine  (XYLOCAINE ) 2 % viscous mouth solution 15 mL  15 mL Mouth/Throat Q6H PRN Jenna Hacker  A, MD   10 mL at 06/14/24 1324   lipase/protease/amylase) (VIOKACE ) tablets 20,880 Units  20,880 Units Per Tube Once Elgergawy, Brayton RAMAN, MD       And   sodium bicarbonate  tablet 650 mg  650 mg Per Tube Once Elgergawy, Dawood S, MD       nutrition supplement (JUVEN) (JUVEN) powder packet 1 packet  1 packet Per Tube BID BM Samtani, Jai-Gurmukh, MD   1 packet at 06/14/24 1531   ondansetron  (ZOFRAN ) injection 4 mg  4 mg Intravenous Q6H PRN Arthea Child, MD       potassium chloride  (KLOR-CON ) packet 20 mEq  20 mEq Oral Daily Samtani, Jai-Gurmukh, MD   20 mEq at 06/13/24 9073   simethicone  (MYLICON) chewable tablet 80 mg  80 mg Per Tube TID Claiborne, Claudia, MD   80 mg at 06/14/24 2104   sodium chloride  flush (NS) 0.9 % injection 10-40 mL  10-40 mL Intracatheter Q12H Elgergawy, Brayton RAMAN, MD   10 mL at 06/14/24 2109   sodium chloride  flush (NS) 0.9 % injection 10-40 mL  10-40 mL Intracatheter PRN Elgergawy, Dawood S, MD       valproate (DEPACON ) 500 mg in dextrose  5 % 50 mL  IVPB  500 mg Intravenous TID Elgergawy, Dawood S, MD 55 mL/hr at 06/14/24 2356 500 mg at 06/14/24 2356   valproate (DEPACON ) 750 mg in dextrose  5 % 50 mL IVPB  750 mg Intravenous QHS Elgergawy, Dawood S, MD 57.5 mL/hr at 06/15/24 0215 750 mg at 06/15/24 0215     Discharge Medications: Please see discharge summary for a list of discharge medications.  Relevant Imaging Results:  Relevant Lab Results:   Additional Information SSN: [id number]. OP Palliative care to follow at [facility], LCSW

## 2024-06-16 ENCOUNTER — Ambulatory Visit: Admitting: Internal Medicine

## 2024-06-19 ENCOUNTER — Emergency Department (HOSPITAL_COMMUNITY)

## 2024-06-19 ENCOUNTER — Inpatient Hospital Stay (HOSPITAL_COMMUNITY)
Admission: EM | Admit: 2024-06-19 | Discharge: 2024-06-23 | DRG: 314 | Disposition: A | Discharge status: Hospice | Source: Skilled Nursing Facility | Attending: Internal Medicine | Admitting: Internal Medicine

## 2024-06-19 ENCOUNTER — Encounter (HOSPITAL_COMMUNITY): Payer: Self-pay

## 2024-06-19 ENCOUNTER — Other Ambulatory Visit: Payer: Self-pay

## 2024-06-19 DIAGNOSIS — Z79890 Hormone replacement therapy (postmenopausal): Secondary | ICD-10-CM

## 2024-06-19 DIAGNOSIS — R579 Shock, unspecified: Secondary | ICD-10-CM | POA: Diagnosis present

## 2024-06-19 DIAGNOSIS — N390 Urinary tract infection, site not specified: Secondary | ICD-10-CM | POA: Diagnosis present

## 2024-06-19 DIAGNOSIS — Z883 Allergy status to other anti-infective agents status: Secondary | ICD-10-CM

## 2024-06-19 DIAGNOSIS — G40909 Epilepsy, unspecified, not intractable, without status epilepticus: Secondary | ICD-10-CM | POA: Diagnosis present

## 2024-06-19 DIAGNOSIS — A4152 Sepsis due to Pseudomonas: Secondary | ICD-10-CM | POA: Diagnosis present

## 2024-06-19 DIAGNOSIS — E871 Hypo-osmolality and hyponatremia: Secondary | ICD-10-CM | POA: Diagnosis present

## 2024-06-19 DIAGNOSIS — Z515 Encounter for palliative care: Secondary | ICD-10-CM

## 2024-06-19 DIAGNOSIS — J9611 Chronic respiratory failure with hypoxia: Secondary | ICD-10-CM | POA: Diagnosis present

## 2024-06-19 DIAGNOSIS — I129 Hypertensive chronic kidney disease with stage 1 through stage 4 chronic kidney disease, or unspecified chronic kidney disease: Secondary | ICD-10-CM | POA: Diagnosis present

## 2024-06-19 DIAGNOSIS — I480 Paroxysmal atrial fibrillation: Secondary | ICD-10-CM | POA: Diagnosis present

## 2024-06-19 DIAGNOSIS — Z6834 Body mass index (BMI) 34.0-34.9, adult: Secondary | ICD-10-CM

## 2024-06-19 DIAGNOSIS — Z22322 Carrier or suspected carrier of Methicillin resistant Staphylococcus aureus: Secondary | ICD-10-CM

## 2024-06-19 DIAGNOSIS — E785 Hyperlipidemia, unspecified: Secondary | ICD-10-CM | POA: Diagnosis present

## 2024-06-19 DIAGNOSIS — D696 Thrombocytopenia, unspecified: Secondary | ICD-10-CM | POA: Diagnosis present

## 2024-06-19 DIAGNOSIS — F0390 Unspecified dementia without behavioral disturbance: Secondary | ICD-10-CM | POA: Diagnosis present

## 2024-06-19 DIAGNOSIS — Z87891 Personal history of nicotine dependence: Secondary | ICD-10-CM

## 2024-06-19 DIAGNOSIS — Z950 Presence of cardiac pacemaker: Secondary | ICD-10-CM

## 2024-06-19 DIAGNOSIS — Z9884 Bariatric surgery status: Secondary | ICD-10-CM

## 2024-06-19 DIAGNOSIS — B9562 Methicillin resistant Staphylococcus aureus infection as the cause of diseases classified elsewhere: Secondary | ICD-10-CM | POA: Diagnosis present

## 2024-06-19 DIAGNOSIS — E1122 Type 2 diabetes mellitus with diabetic chronic kidney disease: Secondary | ICD-10-CM | POA: Diagnosis present

## 2024-06-19 DIAGNOSIS — N179 Acute kidney failure, unspecified: Secondary | ICD-10-CM | POA: Diagnosis present

## 2024-06-19 DIAGNOSIS — Z66 Do not resuscitate: Secondary | ICD-10-CM | POA: Diagnosis present

## 2024-06-19 DIAGNOSIS — A419 Sepsis, unspecified organism: Principal | ICD-10-CM

## 2024-06-19 DIAGNOSIS — R1310 Dysphagia, unspecified: Secondary | ICD-10-CM | POA: Diagnosis present

## 2024-06-19 DIAGNOSIS — E872 Acidosis, unspecified: Secondary | ICD-10-CM | POA: Diagnosis present

## 2024-06-19 DIAGNOSIS — Y838 Other surgical procedures as the cause of abnormal reaction of the patient, or of later complication, without mention of misadventure at the time of the procedure: Secondary | ICD-10-CM | POA: Diagnosis present

## 2024-06-19 DIAGNOSIS — Z91048 Other nonmedicinal substance allergy status: Secondary | ICD-10-CM

## 2024-06-19 DIAGNOSIS — R6521 Severe sepsis with septic shock: Secondary | ICD-10-CM | POA: Diagnosis present

## 2024-06-19 DIAGNOSIS — Z833 Family history of diabetes mellitus: Secondary | ICD-10-CM

## 2024-06-19 DIAGNOSIS — Z7982 Long term (current) use of aspirin: Secondary | ICD-10-CM

## 2024-06-19 DIAGNOSIS — Z882 Allergy status to sulfonamides status: Secondary | ICD-10-CM

## 2024-06-19 DIAGNOSIS — D539 Nutritional anemia, unspecified: Secondary | ICD-10-CM | POA: Diagnosis present

## 2024-06-19 DIAGNOSIS — Z792 Long term (current) use of antibiotics: Secondary | ICD-10-CM

## 2024-06-19 DIAGNOSIS — I69320 Aphasia following cerebral infarction: Secondary | ICD-10-CM

## 2024-06-19 DIAGNOSIS — Z931 Gastrostomy status: Secondary | ICD-10-CM

## 2024-06-19 DIAGNOSIS — Z7401 Bed confinement status: Secondary | ICD-10-CM

## 2024-06-19 DIAGNOSIS — Z79899 Other long term (current) drug therapy: Secondary | ICD-10-CM

## 2024-06-19 DIAGNOSIS — I330 Acute and subacute infective endocarditis: Secondary | ICD-10-CM | POA: Diagnosis present

## 2024-06-19 DIAGNOSIS — L89154 Pressure ulcer of sacral region, stage 4: Secondary | ICD-10-CM | POA: Diagnosis present

## 2024-06-19 DIAGNOSIS — T827XXA Infection and inflammatory reaction due to other cardiac and vascular devices, implants and grafts, initial encounter: Principal | ICD-10-CM | POA: Diagnosis present

## 2024-06-19 DIAGNOSIS — Z885 Allergy status to narcotic agent status: Secondary | ICD-10-CM

## 2024-06-19 LAB — CBC WITH DIFFERENTIAL/PLATELET
Abs Immature Granulocytes: 0.11 K/uL — ABNORMAL HIGH (ref 0.00–0.07)
Basophils Absolute: 0.1 K/uL (ref 0.0–0.1)
Basophils Relative: 0 %
Eosinophils Absolute: 0 K/uL (ref 0.0–0.5)
Eosinophils Relative: 0 %
HCT: 34.6 % — ABNORMAL LOW (ref 36.0–46.0)
Hemoglobin: 10.7 g/dL — ABNORMAL LOW (ref 12.0–15.0)
Immature Granulocytes: 1 %
Lymphocytes Relative: 13 %
Lymphs Abs: 1.5 K/uL (ref 0.7–4.0)
MCH: 34 pg (ref 26.0–34.0)
MCHC: 30.9 g/dL (ref 30.0–36.0)
MCV: 109.8 fL — ABNORMAL HIGH (ref 80.0–100.0)
Monocytes Absolute: 1.3 K/uL — ABNORMAL HIGH (ref 0.1–1.0)
Monocytes Relative: 10 %
Neutro Abs: 9.1 K/uL — ABNORMAL HIGH (ref 1.7–7.7)
Neutrophils Relative %: 76 %
Platelets: 139 K/uL — ABNORMAL LOW (ref 150–400)
RBC: 3.15 MIL/uL — ABNORMAL LOW (ref 3.87–5.11)
RDW: 16.7 % — ABNORMAL HIGH (ref 11.5–15.5)
WBC: 12 K/uL — ABNORMAL HIGH (ref 4.0–10.5)
nRBC: 0.7 % — ABNORMAL HIGH (ref 0.0–0.2)

## 2024-06-19 LAB — PROTIME-INR
INR: 1.5 — ABNORMAL HIGH (ref 0.8–1.2)
Prothrombin Time: 19.4 s — ABNORMAL HIGH (ref 11.4–15.2)

## 2024-06-19 LAB — RESP PANEL BY RT-PCR (RSV, FLU A&B, COVID)  RVPGX2
Influenza A by PCR: NEGATIVE
Influenza B by PCR: NEGATIVE
Resp Syncytial Virus by PCR: NEGATIVE
SARS Coronavirus 2 by RT PCR: NEGATIVE

## 2024-06-19 LAB — COMPREHENSIVE METABOLIC PANEL WITH GFR
ALT: 13 U/L (ref 0–44)
AST: 49 U/L — ABNORMAL HIGH (ref 15–41)
Albumin: <1.5 g/dL — ABNORMAL LOW (ref 3.5–5.0)
Alkaline Phosphatase: 223 U/L — ABNORMAL HIGH (ref 38–126)
Anion gap: 14 (ref 5–15)
BUN: 26 mg/dL — ABNORMAL HIGH (ref 8–23)
CO2: 22 mmol/L (ref 22–32)
Calcium: 7.7 mg/dL — ABNORMAL LOW (ref 8.9–10.3)
Chloride: 96 mmol/L — ABNORMAL LOW (ref 98–111)
Creatinine, Ser: 1.38 mg/dL — ABNORMAL HIGH (ref 0.44–1.00)
GFR, Estimated: 40 mL/min — ABNORMAL LOW (ref >60)
Glucose, Bld: 102 mg/dL — ABNORMAL HIGH (ref 70–99)
Potassium: 3.9 mmol/L (ref 3.5–5.1)
Sodium: 132 mmol/L — ABNORMAL LOW (ref 135–145)
Total Bilirubin: 0.9 mg/dL (ref 0.0–1.2)
Total Protein: 9.8 g/dL — ABNORMAL HIGH (ref 6.5–8.1)

## 2024-06-19 LAB — CBG MONITORING, ED: Glucose-Capillary: 107 mg/dL — ABNORMAL HIGH (ref 70–99)

## 2024-06-19 LAB — I-STAT CG4 LACTIC ACID, ED
Lactic Acid, Venous: 7.4 mmol/L (ref 0.5–1.9)
Lactic Acid, Venous: 7.4 mmol/L (ref 0.5–1.9)

## 2024-06-19 MED ORDER — ACETAMINOPHEN 650 MG RE SUPP
650.0000 mg | Freq: Once | RECTAL | Status: AC
  Administered 2024-06-19: 650 mg via RECTAL
  Filled 2024-06-19: qty 1

## 2024-06-19 MED ORDER — LACTATED RINGERS IV BOLUS (SEPSIS)
1000.0000 mL | Freq: Once | INTRAVENOUS | Status: AC
  Administered 2024-06-20: 1000 mL via INTRAVENOUS

## 2024-06-19 MED ORDER — IOHEXOL 350 MG/ML SOLN
80.0000 mL | Freq: Once | INTRAVENOUS | Status: AC | PRN
  Administered 2024-06-19: 80 mL via INTRAVENOUS

## 2024-06-19 MED ORDER — VANCOMYCIN HCL IN DEXTROSE 1-5 GM/200ML-% IV SOLN
1000.0000 mg | Freq: Once | INTRAVENOUS | Status: AC
  Administered 2024-06-19: 1000 mg via INTRAVENOUS
  Filled 2024-06-19: qty 200

## 2024-06-19 MED ORDER — SODIUM CHLORIDE 0.9 % IV SOLN
2.0000 g | Freq: Once | INTRAVENOUS | Status: AC
  Administered 2024-06-19: 2 g via INTRAVENOUS
  Filled 2024-06-19: qty 12.5

## 2024-06-19 MED ORDER — LACTATED RINGERS IV BOLUS: 1000.0000 mL | Freq: Once | INTRAVENOUS | Status: DC

## 2024-06-19 MED ORDER — METRONIDAZOLE 500 MG/100ML IV SOLN
500.0000 mg | Freq: Once | INTRAVENOUS | Status: AC
  Administered 2024-06-19: 500 mg via INTRAVENOUS
  Filled 2024-06-19: qty 100

## 2024-06-19 MED ORDER — NOREPINEPHRINE 4 MG/250ML-% IV SOLN
0.0000 ug/min | INTRAVENOUS | Status: DC
  Administered 2024-06-19: 4 ug/min via INTRAVENOUS
  Filled 2024-06-19: qty 250

## 2024-06-19 MED ORDER — LACTATED RINGERS IV SOLN: INTRAVENOUS | Status: DC

## 2024-06-19 MED ORDER — LACTATED RINGERS IV BOLUS (SEPSIS)
500.0000 mL | Freq: Once | INTRAVENOUS | Status: DC
  Administered 2024-06-19: 500 mL via INTRAVENOUS

## 2024-06-19 MED ORDER — LACTATED RINGERS IV BOLUS (SEPSIS): 500.0000 mL | Freq: Once | INTRAVENOUS | Status: DC

## 2024-06-19 MED ORDER — ACETAMINOPHEN 10 MG/ML IV SOLN
1000.0000 mg | Freq: Four times a day (QID) | INTRAVENOUS | Status: AC
  Administered 2024-06-19 – 2024-06-20 (×3): 1000 mg via INTRAVENOUS
  Filled 2024-06-19 (×4): qty 100

## 2024-06-19 MED ORDER — LACTATED RINGERS IV BOLUS (SEPSIS)
1000.0000 mL | Freq: Once | INTRAVENOUS | Status: AC
  Administered 2024-06-19: 1000 mL via INTRAVENOUS

## 2024-06-19 NOTE — Sepsis Progress Note (Signed)
 Elink following code sepsis

## 2024-06-19 NOTE — ED Triage Notes (Signed)
 Pt arriving to ED via ems for evaluation of possible sepsis. Pt had temp of 101.6 per ems and HR in the 120's. Pt BG 119 per ems. EMS states that pt has a possible infection in her pacemaker and feeding tube. PT arrives to ED alert and non-verbal per baseline on 3lpm [place] baseline

## 2024-06-19 NOTE — ED Provider Notes (Signed)
 [place] [facility] AT [facility] Provider Note   CSN: [id number] Arrival date & time: 06/19/24  1943     Patient presents with: Code Sepsis   Kathleen Jensen is a 73 y.o. female.  {Add pertinent medical, surgical, social history, OB history to HPI:32947} HPI Hx of CVA, bedbound with a chronic PEG, chronic urinary retention with indwelling Foley catheter, seizure disorder, advanced dementia, recently discharged 2 months ago for endocarditis, felt to be not a surgical candidate so she was committed to lifelong suppressive antibiotic treatment treated initially with IV antibiotics (PICC line was placed on 04/06/2024), now on oral cefadroxil  since 05/07/2024 presenting with worsening lethargy, febrile, and tachycardic.  History per EMS.  Patient coming from nursing facility, baseline GCS of 6, intermittently will moan, able to track but otherwise unable to move extremities.  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  Patient unable to provide history.  Admitted 11/17 due to sepsis, encephalopathy with workup significant for Pseudomonas bacteremia, she finished 10 days of appropriate IV antibiotics treatment.  Treated with ciprofloxacin .    Prior to Admission medications   Medication Sig Start Date End Date Taking? Authorizing Provider  acetaminophen  (TYLENOL ) 160 MG/5ML solution Place 20 mLs (640 mg total) into feeding tube every 8 (eight) hours. 12/03/22   Elgergawy, Brayton RAMAN, MD  acetic acid  0.25 % irrigation 20cc irrigation twice a day, flush foley to prevent clogging 11/06/23   [provider]  amiodarone  (PACERONE ) 200 MG tablet Place 1 tablet (200 mg total) into feeding tube daily. 12/03/22   Elgergawy, Brayton RAMAN, MD  ascorbic acid  (VITAMIN C ) 500 MG tablet Place 0.5 tablets (250 mg total) into feeding tube 2 (two) times daily.  06/09/23   Rai, Nydia POUR, MD  aspirin  81 MG chewable tablet Place 81 mg into feeding tube at bedtime.    [provider]  cefadroxil  (DURICEF) 500 MG/5ML suspension Take 10 mLs (1,000 mg total) by mouth every 12 (twelve) hours. Patient not taking: Reported on 06/05/2024 05/07/24 11/03/24  Overton Faith T, MD  chlorhexidine  (PERIDEX ) 0.12 % solution Dab a toothbrush into 15 mLs and brush teeth 2 times a day Patient not taking: Reported on 06/05/2024    [provider]  Cranberry 425 MG CAPS Take 425 mg by mouth in the morning and at bedtime.    [provider]  cyclobenzaprine  (FLEXERIL ) 5 MG tablet Place 2.5 mg into feeding tube in the morning and at bedtime. Hold If Sedated    [provider]  diazePAM , 20 MG Dose, (VALTOCO  20 MG DOSE) 2 x 10 MG/0.1ML LQPK Place 20 mg into the nose daily as needed (seizures).    [provider]  docusate sodium  (COLACE) 100 MG capsule Give 1 capsule per tube twice a day    [provider]  estradiol  (ESTRACE ) 0.1 MG/GM vaginal cream Place vaginally See admin instructions. Apply a pea-sized amount to the urethra/vaginal wall on Tuesdays and Fridays    [provider]  famotidine  (PEPCID ) 40 MG/5ML suspension Place 40 mg into feeding tube daily. 03/25/22   [provider]  folic acid  (FOLVITE ) 800 MCG tablet Place 800 mcg into feeding tube daily.    [provider]  HYDROcodone -acetaminophen  (NORCO/VICODIN) 5-325 MG tablet Take 1 tablet by mouth 2 (two) times daily as needed (for pain). 06/15/24   Elgergawy, Brayton RAMAN, MD  levETIRAcetam  (KEPPRA )  100 MG/ML solution Place 1,000 mg into feeding tube 2 (two) times daily.    [provider]  liver oil-zinc  oxide (DESITIN) 40 % ointment Apply topically 2 (two) times daily. Apply Desitin around G tube site BID and change split thickness gauze each time 05/31/24   Cheryle Page, MD  metoprolol  tartrate (LOPRESSOR ) 25 MG tablet Place 1 tablet (25  mg total) into feeding tube 2 (two) times daily. 12/03/22   Elgergawy, Brayton RAMAN, MD  Multiple Vitamin (MULTIVITAMIN WITH MINERALS) TABS tablet Place 1 tablet into feeding tube daily. 04/06/24   Tharon Lung, MD  Nutritional Supplements (FEEDING SUPPLEMENT, OSMOLITE 1.2 CAL,) LIQD Place 1,000 mLs into feeding tube continuous. 05/31/24   Cheryle Page, MD  nystatin  ointment (MYCOSTATIN ) Apply 1 Application topically 3 (three) times daily. 04/26/24   [provider]  polyethylene glycol (MIRALAX  / GLYCOLAX ) 17 g packet Place 34 g into feeding tube daily. 06/09/23   Rai, Nydia POUR, MD  Protein (FEEDING SUPPLEMENT, PROSOURCE TF20,) liquid Place 60 mLs into feeding tube daily. 06/01/24   Cheryle Page, MD  simethicone  (MYLICON) 80 MG chewable tablet Place 80 mg into feeding tube 3 (three) times daily.    [provider]  simvastatin  (ZOCOR ) 10 MG tablet Place 1 tablet (10 mg total) into feeding tube at bedtime. 02/22/22   Cheryle Page, MD  torsemide  (DEMADEX ) 10 MG tablet Place 10 mg into feeding tube every 8 (eight) hours. Give 10 mg per tube every 12 hours for edema, hold for a systolic reading less than 110    [provider]  valproic  acid (DEPAKENE ) 250 MG/5ML solution Place 10-15 mLs into feeding tube See admin instructions. 10 ml via tube three times a day and 15 ml at bedtime    [provider]  Water  For Irrigation, Sterile (FREE WATER ) SOLN Place 200 mLs into feeding tube every 4 (four) hours. 05/31/24   Cheryle Page, MD    Allergies: Tape, Demerol [meperidine hcl], Sulfonamide derivatives, Bacitracin-polymyxin b, and Oxycontin  [oxycodone ]    Review of Systems  Updated Vital Signs There were no vitals taken for this visit.  Physical Exam Vitals and nursing note reviewed.  Constitutional:      General: She is not in acute distress.    Appearance: She is ill-appearing.     Comments: GCS of 6, intermittently moans, able to track.  Ill-appearing.  No acute  distress.  HENT:     Head: Normocephalic and atraumatic.     Mouth/Throat:     Mouth: Mucous membranes are dry.     Pharynx: Oropharynx is clear.  Eyes:     Pupils: Pupils are equal, round, and reactive to light.  Cardiovascular:     Rate and Rhythm: Regular rhythm. Tachycardia present.     Pulses: Normal pulses.     Heart sounds: Normal heart sounds. No murmur heard.    No gallop.     Comments: Surgical scars from prior pacemaker placement, no surrounding erythema or drainage from site. Pulmonary:     Effort: Pulmonary effort is normal. No respiratory distress.     Breath sounds: Normal breath sounds. No stridor. No wheezing, rhonchi or rales.  Abdominal:     General: Abdomen is flat. There is no distension.     Palpations: Abdomen is soft.     Tenderness: There is no abdominal tenderness. There is no right CVA tenderness, left CVA tenderness, guarding or rebound.     Comments: G-tube present left upper quadrant, no surrounding erythema  or drainage appreciated.  Foley catheter in place, actively draining urine with significant sedimentation.  Musculoskeletal:     Cervical back: Neck supple.  Skin:    General: Skin is warm.     Capillary Refill: Capillary refill takes 2 to 3 seconds.  Neurological:     Comments: GCS is 6, intermittently moans, does not move extremities, intermittently tracks.     (all labs ordered are listed, but only abnormal results are displayed) Labs Reviewed - No data to display  EKG: None  Radiology: No results found.  {Document cardiac monitor, telemetry assessment procedure when appropriate:32947} Procedures   Medications Ordered in the ED - No data to display    {Click here for ABCD2, HEART and other calculators REFRESH Note before signing:1}                              Medical Decision Making Amount and/or Complexity of Data Reviewed Labs: ordered. Radiology: ordered.  Risk Prescription drug management.   ***  {Document  critical care time when appropriate  Document review of labs and clinical decision tools ie CHADS2VASC2, etc  Document your independent review of radiology images and any outside records  Document your discussion with family members, caretakers and with consultants  Document social determinants of health affecting pt's care  Document your decision making why or why not admission, treatments were needed:32947:::1}   Final diagnoses:  None    ED Discharge Orders     None

## 2024-06-19 NOTE — Progress Notes (Signed)
IV obtained by ED staff.

## 2024-06-20 ENCOUNTER — Inpatient Hospital Stay (HOSPITAL_COMMUNITY)

## 2024-06-20 DIAGNOSIS — N179 Acute kidney failure, unspecified: Secondary | ICD-10-CM

## 2024-06-20 DIAGNOSIS — D696 Thrombocytopenia, unspecified: Secondary | ICD-10-CM

## 2024-06-20 DIAGNOSIS — E872 Acidosis, unspecified: Secondary | ICD-10-CM | POA: Diagnosis not present

## 2024-06-20 DIAGNOSIS — E871 Hypo-osmolality and hyponatremia: Secondary | ICD-10-CM | POA: Diagnosis not present

## 2024-06-20 DIAGNOSIS — D509 Iron deficiency anemia, unspecified: Secondary | ICD-10-CM

## 2024-06-20 DIAGNOSIS — R579 Shock, unspecified: Secondary | ICD-10-CM | POA: Diagnosis present

## 2024-06-20 DIAGNOSIS — R578 Other shock: Secondary | ICD-10-CM

## 2024-06-20 DIAGNOSIS — A419 Sepsis, unspecified organism: Secondary | ICD-10-CM | POA: Diagnosis not present

## 2024-06-20 LAB — BASIC METABOLIC PANEL WITH GFR
Anion gap: 14 (ref 5–15)
BUN: 27 mg/dL — ABNORMAL HIGH (ref 8–23)
CO2: 20 mmol/L — ABNORMAL LOW (ref 22–32)
Calcium: 7.2 mg/dL — ABNORMAL LOW (ref 8.9–10.3)
Chloride: 98 mmol/L (ref 98–111)
Creatinine, Ser: 1.36 mg/dL — ABNORMAL HIGH (ref 0.44–1.00)
GFR, Estimated: 41 mL/min — ABNORMAL LOW (ref >60)
Glucose, Bld: 86 mg/dL (ref 70–99)
Potassium: 3.4 mmol/L — ABNORMAL LOW (ref 3.5–5.1)
Sodium: 132 mmol/L — ABNORMAL LOW (ref 135–145)

## 2024-06-20 LAB — CBC
HCT: 26.5 % — ABNORMAL LOW (ref 36.0–46.0)
Hemoglobin: 8.3 g/dL — ABNORMAL LOW (ref 12.0–15.0)
MCH: 34.6 pg — ABNORMAL HIGH (ref 26.0–34.0)
MCHC: 31.3 g/dL (ref 30.0–36.0)
MCV: 110.4 fL — ABNORMAL HIGH (ref 80.0–100.0)
Platelets: 120 K/uL — ABNORMAL LOW (ref 150–400)
RBC: 2.4 MIL/uL — ABNORMAL LOW (ref 3.87–5.11)
RDW: 17 % — ABNORMAL HIGH (ref 11.5–15.5)
WBC: 20.7 K/uL — ABNORMAL HIGH (ref 4.0–10.5)
nRBC: 0.1 % (ref 0.0–0.2)

## 2024-06-20 LAB — URINALYSIS, W/ REFLEX TO CULTURE (INFECTION SUSPECTED)
Bilirubin Urine: NEGATIVE
Glucose, UA: NEGATIVE mg/dL
Hgb urine dipstick: NEGATIVE
Ketones, ur: NEGATIVE mg/dL
Leukocytes,Ua: NEGATIVE
Nitrite: NEGATIVE
Protein, ur: NEGATIVE mg/dL
Specific Gravity, Urine: 1.041 — ABNORMAL HIGH (ref 1.005–1.030)
pH: 5 (ref 5.0–8.0)

## 2024-06-20 LAB — GLUCOSE, CAPILLARY
Glucose-Capillary: 101 mg/dL — ABNORMAL HIGH (ref 70–99)
Glucose-Capillary: 110 mg/dL — ABNORMAL HIGH (ref 70–99)
Glucose-Capillary: 152 mg/dL — ABNORMAL HIGH (ref 70–99)
Glucose-Capillary: 164 mg/dL — ABNORMAL HIGH (ref 70–99)
Glucose-Capillary: 144 mg/dL — ABNORMAL HIGH (ref 70–99)
Glucose-Capillary: 124 mg/dL — ABNORMAL HIGH (ref 70–99)
Glucose-Capillary: 94 mg/dL (ref 70–99)

## 2024-06-20 LAB — ECHOCARDIOGRAM COMPLETE
AR max vel: 1.97 cm2
AV Area VTI: 2 cm2
AV Area mean vel: 1.89 cm2
AV Mean grad: 7 mmHg
AV Peak grad: 12.4 mmHg
Ao pk vel: 1.76 m/s
Area-P 1/2: 3.65 cm2
Height: 70 in
MV VTI: 2.14 cm2
S' Lateral: 2.9 cm
Weight: 3806.02 [oz_av]

## 2024-06-20 LAB — MRSA NEXT GEN BY PCR, NASAL: MRSA by PCR Next Gen: DETECTED — AB

## 2024-06-20 LAB — MAGNESIUM: Magnesium: 1.6 mg/dL — ABNORMAL LOW (ref 1.7–2.4)

## 2024-06-20 LAB — PROTEIN / CREATININE RATIO, URINE
Creatinine, Urine: 60 mg/dL
Protein Creatinine Ratio: 0.35 mg/mg{creat} — ABNORMAL HIGH (ref 0.00–0.15)
Total Protein, Urine: 21 mg/dL

## 2024-06-20 LAB — TSH: TSH: 2.858 u[IU]/mL (ref 0.350–4.500)

## 2024-06-20 LAB — CORTISOL: Cortisol, Plasma: 18.5 ug/dL

## 2024-06-20 LAB — LACTIC ACID, PLASMA
Lactic Acid, Venous: 7.1 mmol/L (ref 0.5–1.9)
Lactic Acid, Venous: 5.5 mmol/L (ref 0.5–1.9)

## 2024-06-20 LAB — T4, FREE: Free T4: 1.02 ng/dL (ref 0.61–1.12)

## 2024-06-20 LAB — SODIUM, URINE, RANDOM: Sodium, Ur: <30 mmol/L

## 2024-06-20 LAB — GAMMA GT: GGT: 119 U/L — ABNORMAL HIGH (ref 7–50)

## 2024-06-20 MED ORDER — SODIUM CHLORIDE 0.9 % IV SOLN
2.0000 g | Freq: Two times a day (BID) | INTRAVENOUS | Status: DC
  Administered 2024-06-20 (×2): 2 g via INTRAVENOUS
  Filled 2024-06-20 (×2): qty 12.5

## 2024-06-20 MED ORDER — VALPROIC ACID 250 MG/5ML PO SOLN
750.0000 mg | Freq: Every day | ORAL | Status: DC
  Administered 2024-06-20: 250 mg
  Administered 2024-06-21 – 2024-06-22 (×2): 750 mg
  Filled 2024-06-20 (×4): qty 15

## 2024-06-20 MED ORDER — POLYETHYLENE GLYCOL 3350 17 G PO PACK: 17.0000 g | PACK | Freq: Every day | ORAL | Status: DC | PRN

## 2024-06-20 MED ORDER — ADULT MULTIVITAMIN W/MINERALS CH
1.0000 | ORAL_TABLET | Freq: Every day | ORAL | Status: DC
  Administered 2024-06-21 – 2024-06-22 (×2): 1
  Filled 2024-06-20 (×2): qty 1

## 2024-06-20 MED ORDER — DOCUSATE SODIUM 100 MG PO CAPS: 100.0000 mg | ORAL_CAPSULE | Freq: Two times a day (BID) | ORAL | Status: DC | PRN

## 2024-06-20 MED ORDER — LACTATED RINGERS IV BOLUS
1000.0000 mL | Freq: Once | INTRAVENOUS | Status: AC
  Administered 2024-06-20: 1000 mL via INTRAVENOUS

## 2024-06-20 MED ORDER — HEPARIN SODIUM (PORCINE) 5000 UNIT/ML IJ SOLN
5000.0000 [IU] | Freq: Three times a day (TID) | INTRAMUSCULAR | Status: DC
  Administered 2024-06-20 – 2024-06-22 (×8): 5000 [IU] via SUBCUTANEOUS
  Filled 2024-06-20 (×8): qty 1

## 2024-06-20 MED ORDER — AMIODARONE HCL 200 MG PO TABS
200.0000 mg | ORAL_TABLET | Freq: Every day | ORAL | Status: DC
  Administered 2024-06-21 – 2024-06-23 (×3): 200 mg
  Filled 2024-06-20 (×3): qty 1

## 2024-06-20 MED ORDER — VALPROIC ACID 250 MG/5ML PO SOLN
500.0000 mg | Freq: Three times a day (TID) | ORAL | Status: DC
  Administered 2024-06-20 – 2024-06-23 (×9): 500 mg
  Filled 2024-06-20 (×10): qty 10

## 2024-06-20 MED ORDER — LACTATED RINGERS IV BOLUS
500.0000 mL | Freq: Once | INTRAVENOUS | Status: AC
  Administered 2024-06-20: 500 mL via INTRAVENOUS

## 2024-06-20 MED ORDER — MAGNESIUM SULFATE 4 GM/100ML IV SOLN
4.0000 g | Freq: Once | INTRAVENOUS | Status: AC
  Administered 2024-06-20: 4 g via INTRAVENOUS
  Filled 2024-06-20: qty 100

## 2024-06-20 MED ORDER — VANCOMYCIN VARIABLE DOSE PER UNSTABLE RENAL FUNCTION (PHARMACIST DOSING): Status: DC

## 2024-06-20 MED ORDER — POTASSIUM CHLORIDE CRYS ER 20 MEQ PO TBCR: 40.0000 meq | EXTENDED_RELEASE_TABLET | Freq: Once | ORAL | Status: DC

## 2024-06-20 MED ORDER — MIDODRINE HCL 5 MG PO TABS
20.0000 mg | ORAL_TABLET | Freq: Three times a day (TID) | ORAL | Status: DC
  Administered 2024-06-21 – 2024-06-22 (×5): 20 mg
  Filled 2024-06-20 (×5): qty 4

## 2024-06-20 MED ORDER — NOREPINEPHRINE 4 MG/250ML-% IV SOLN
0.0000 ug/min | INTRAVENOUS | Status: DC
  Administered 2024-06-20: 13 ug/min via INTRAVENOUS
  Administered 2024-06-20: 15 ug/min via INTRAVENOUS
  Administered 2024-06-21: 2 ug/min via INTRAVENOUS
  Administered 2024-06-21: 13 ug/min via INTRAVENOUS
  Administered 2024-06-21: 15 ug/min via INTRAVENOUS
  Administered 2024-06-21: 5 ug/min via INTRAVENOUS
  Administered 2024-06-22: 9 ug/min via INTRAVENOUS
  Administered 2024-06-22: 10 ug/min via INTRAVENOUS
  Filled 2024-06-20 (×8): qty 250

## 2024-06-20 MED ORDER — PIPERACILLIN-TAZOBACTAM 3.375 G IVPB: 3.3750 g | Freq: Three times a day (TID) | INTRAVENOUS | Status: DC

## 2024-06-20 MED ORDER — POTASSIUM CHLORIDE 20 MEQ PO PACK
40.0000 meq | PACK | Freq: Once | ORAL | Status: AC
  Administered 2024-06-20: 40 meq
  Filled 2024-06-20: qty 2

## 2024-06-20 MED ORDER — CHLORHEXIDINE GLUCONATE CLOTH 2 % EX PADS
6.0000 | MEDICATED_PAD | Freq: Every day | CUTANEOUS | Status: DC
  Administered 2024-06-20 – 2024-06-22 (×3): 6 via TOPICAL

## 2024-06-20 MED ORDER — CYCLOBENZAPRINE HCL 5 MG PO TABS
2.5000 mg | ORAL_TABLET | Freq: Two times a day (BID) | ORAL | Status: DC
  Administered 2024-06-20 – 2024-06-23 (×6): 2.5 mg
  Filled 2024-06-20 (×4): qty 0.5
  Filled 2024-06-20 (×2): qty 1
  Filled 2024-06-20: qty 0.5

## 2024-06-20 MED ORDER — SIMVASTATIN 5 MG PO TABS
10.0000 mg | ORAL_TABLET | Freq: Every day | ORAL | Status: DC
  Administered 2024-06-20 – 2024-06-21 (×2): 10 mg
  Filled 2024-06-20 (×4): qty 2

## 2024-06-20 MED ORDER — LEVETIRACETAM 100 MG/ML PO SOLN
1000.0000 mg | Freq: Two times a day (BID) | ORAL | Status: DC
  Administered 2024-06-20 – 2024-06-23 (×6): 1000 mg
  Filled 2024-06-20 (×8): qty 10

## 2024-06-20 MED ORDER — FOLIC ACID 1 MG PO TABS
1000.0000 ug | ORAL_TABLET | Freq: Every day | ORAL | Status: DC
  Administered 2024-06-20 – 2024-06-21 (×2): 1 mg
  Filled 2024-06-20 (×2): qty 1

## 2024-06-20 MED ORDER — SODIUM CHLORIDE 0.9 % IV SOLN: 250.0000 mL | INTRAVENOUS | Status: AC

## 2024-06-20 MED ORDER — MIDODRINE HCL 5 MG PO TABS
20.0000 mg | ORAL_TABLET | Freq: Three times a day (TID) | ORAL | Status: DC
  Administered 2024-06-20: 20 mg via ORAL
  Filled 2024-06-20 (×2): qty 4

## 2024-06-20 NOTE — Plan of Care (Signed)

## 2024-06-20 NOTE — H&P (Signed)
 NAME:  Kathleen Jensen, MRN:  [id number], DOB:  1951/06/28, LOS: 0 ADMISSION DATE:  06/19/2024, CONSULTATION DATE:  11/23 REFERRING MD:  EDP, CHIEF COMPLAINT:  sepsis   History of Present Illness:  73 yo female presented with concerns for sepsis per EMS. Pt is non-verbal at baseline. Pt was recently admitted for sepsis to [facility] and discharged on 11/18. During that admission it was noted that she had pseudomonas bacteremia and completed 10 days of abx for prior to discharge. Pt was transported back to her SNF without issue but re-presents shortly after on 11/22 with fever and tachycardia. EMS reports concern for infection at peg site or ppm leads (which is known and being chronically treated).   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 rate (!) 31, height 5' 10 (1.778 m), weight 100 kg, SpO2 96%.        Intake/Output Summary (Last 24 hours) at 06/20/2024 0010 Last data filed at 06/19/2024 2232 Gross per 24 hour  Intake 273.92 ml  Output --  Net 273.92 ml   Filed Weights   06/19/24 1948  Weight: 100 kg    Examination: General: nad, resting comfortably in bed HENT: ncat mmmp, perrla Lungs: ctab Cardiovascular: rrr Abdomen: obese, nt nd bs+, peg site with drainage noted Extremities: no c/c + edema Neuro: not following commands, appears to track GU:  deferred  Resolved problem list   Assessment and Plan  Hypotension, suspected 2/2 sepsis Sepsis 2/2 suspected peg site infection Hyponatremia Aki  Lactic acidosis Chronic macrocytic anemia Thrombocytopenia, chronic Chronically elevated alk  phos -titrate vasopressor to map >60 -empiric abx -f/u cx, blood and urine pending -f/u ct abd/pelvis -wound care, eval skin for other wounds -monitor renal indices and I/O -trend lactate, normal function on echo 03/2024 will cont with volume fore now, limited echo in am to ensure no wall motion abnormalities -most lab abnormalities are chronic and not greatly changed from baseline. Will follow sodium, replace as we can per protocols.  -check tsh/cortisol -check ggt for completeness with chronically elevated alk phos Labs   CBC: Recent Labs  Lab 06/19/24 2052  WBC 12.0*  NEUTROABS 9.1*  HGB 10.7*  HCT 34.6*  MCV 109.8*  PLT 139*    Basic Metabolic Panel: Recent Labs  Lab 06/13/24 0630 06/14/24 0741 06/19/24 2052  NA 134* 131* 132*  K 4.6 4.4 3.9  CL 100 96* 96*  CO2 25 27 22   GLUCOSE 132* 103* 102*  BUN 23 16 26*  CREATININE 0.58 0.64 1.38*  CALCIUM  7.7* 7.8* 7.7*   GFR: Estimated Creatinine Clearance: 46.5 mL/min (A) (by C-G formula based on SCr of 1.38 mg/dL (H)). Recent Labs  Lab 06/19/24 2052 06/19/24 2058 06/19/24 2332  WBC 12.0*  --   --   LATICACIDVEN  --  7.4* 7.4*    Liver Function Tests: Recent Labs  Lab 06/19/24 2052  AST 49*  ALT 13  ALKPHOS 223*  BILITOT 0.9  PROT 9.8*  ALBUMIN  <1.5*   No results for input(s): LIPASE, AMYLASE in the last 168 hours. No results for input(s): AMMONIA in the last 168 hours.  ABG    Component Value Date/Time   PHART 7.414 11/16/2022 1418   PCO2ART 56.4 (H) 11/16/2022 1418   PO2ART 335 (H) 11/16/2022 1418   HCO3 30.1 (H) 03/09/2023 2335   TCO2 28 05/19/2024 1455   ACIDBASEDEF 3.9 (H) 02/15/2022 2230   O2SAT 98 03/09/2023 2335     Coagulation  Profile: Recent Labs  Lab 06/19/24 2052  INR 1.5*    Cardiac Enzymes: No results for input(s): CKTOTAL, CKMB, CKMBINDEX, TROPONINI in the last 168 hours.  HbA1C: Hgb A1c MFr Bld  Date/Time Value Ref Range Status  05/29/2024 03:35 AM 5.7 (H) 4.8 - 5.6 % Final    Comment:    (NOTE) Diagnosis of Diabetes The following HbA1c ranges recommended by the American Diabetes Association (ADA) may be used as an aid in the diagnosis of diabetes mellitus.  Hemoglobin             Suggested A1C NGSP%              Diagnosis  <5.7                   Non Diabetic  5.7-6.4                Pre-Diabetic  >6.4                   Diabetic  <7.0                   Glycemic control for                       adults with diabetes.    11/26/2022 10:43 AM 5.4 4.8 - 5.6 % Final    Comment:    (NOTE) Pre diabetes:          5.7%-6.4%  Diabetes:              >6.4%  Glycemic control for   <7.0% adults with diabetes     CBG: Recent Labs  Lab 06/15/24 0441 06/15/24 0745 06/15/24 1206 06/15/24 1601 06/19/24 2105  GLUCAP 167* 121* 158* 166* 107*    Review of Systems:   Unobtainable 2/2 pt's non verbal baseline  Past Medical History:  She,  has a past medical history of Aphasia, Arthritis, Asthma, Cardiac pacemaker, Cerebral amyloid angiopathy (CODE), CKD (chronic kidney disease), Cognitive communication deficit, Diabetes mellitus without complication (HCC), GERD (gastroesophageal reflux disease), 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   Surgical History:   Past Surgical History:  Procedure Laterality Date   BACK SURGERY     Dec 18, 2016 Dr.Torrealba   BREAST BIOPSY Right    BREAST EXCISIONAL  BIOPSY     CERVICAL SPINE SURGERY     C4   CESAREAN SECTION     GASTRIC BYPASS  03/2007   IR GJ TUBE CHANGE  11/16/2023   IR REPLC DUODEN/JEJUNO TUBE PERCUT W/FLUORO  01/27/2023   IR REPLC DUODEN/JEJUNO TUBE PERCUT W/FLUORO  03/07/2023   IR REPLC DUODEN/JEJUNO TUBE PERCUT W/FLUORO  04/30/2023   IR REPLC DUODEN/JEJUNO TUBE PERCUT W/FLUORO  09/02/2023   IR REPLC DUODEN/JEJUNO TUBE PERCUT W/FLUORO  09/24/2023   IR REPLC DUODEN/JEJUNO TUBE PERCUT W/FLUORO  04/02/2024   IR REPLC DUODEN/JEJUNO TUBE PERCUT W/FLUORO  06/14/2024   TONSILLECTOMY     TRANSESOPHAGEAL ECHOCARDIOGRAM (CATH LAB) N/A 04/01/2024   Procedure: TRANSESOPHAGEAL ECHOCARDIOGRAM;  Surgeon: Lonni Slain, MD;  Location: [facility] INVASIVE CV LAB;  Service: Cardiovascular;  Laterality: N/A;     Social History:   reports that she quit smoking about 44 years ago. She started smoking about 48 years ago. She has never used smokeless tobacco. She reports that she does not drink alcohol  and does not use drugs.   Family History:  Her family history includes Cancer in her brother and sister; Diabetes in her father and mother; High blood pressure in her mother; Stroke in her father.   Allergies Allergies  Allergen Reactions   Tape Rash   Demerol [Meperidine Hcl] Nausea And Vomiting   Sulfonamide Derivatives Itching   Bacitracin-Polymyxin B Dermatitis    Not listed on the MAR   Oxycontin  [Oxycodone ] Itching     Home Medications  Prior to Admission medications   Medication Sig Start Date End Date Taking? Authorizing Provider  acetaminophen  (TYLENOL ) 160 MG/5ML solution Place 20 mLs (640 mg total) into feeding tube every 8 (eight) hours. 12/03/22   Elgergawy, Brayton RAMAN, MD  acetic acid  0.25 % irrigation 20cc irrigation twice a day, flush foley to prevent clogging 11/06/23   [provider]  amiodarone  (PACERONE ) 200 MG tablet Place 1 tablet (200 mg total) into feeding tube daily. 12/03/22   Elgergawy, Brayton RAMAN, MD  ascorbic acid   (VITAMIN C ) 500 MG tablet Place 0.5 tablets (250 mg total) into feeding tube 2 (two) times daily. 06/09/23   Rai, Nydia POUR, MD  aspirin  81 MG chewable tablet Place 81 mg into feeding tube at bedtime.    [provider]  cefadroxil  (DURICEF) 500 MG/5ML suspension Take 10 mLs (1,000 mg total) by mouth every 12 (twelve) hours. Patient not taking: Reported on 06/05/2024 05/07/24 11/03/24  Overton Faith T, MD  chlorhexidine  (PERIDEX ) 0.12 % solution Dab a toothbrush into 15 mLs and brush teeth 2 times a day Patient not taking: Reported on 06/05/2024    [provider]  Cranberry 425 MG CAPS Take 425 mg by mouth in the morning and at bedtime.    [provider]  cyclobenzaprine  (FLEXERIL ) 5 MG tablet Place 2.5 mg into feeding tube in the morning and at bedtime. Hold If Sedated    [provider]  diazePAM , 20 MG Dose, (VALTOCO  20 MG DOSE) 2 x 10 MG/0.1ML LQPK Place 20 mg into the nose daily as needed (seizures).    [provider]  docusate sodium  (COLACE) 100 MG capsule Give 1 capsule per tube twice a day    [provider]  estradiol  (ESTRACE ) 0.1 MG/GM vaginal cream Place vaginally See admin instructions. Apply a pea-sized amount to the urethra/vaginal wall on Tuesdays and Fridays    [provider]  famotidine  (PEPCID ) 40 MG/5ML suspension Place 40 mg into feeding tube daily. 03/25/22   [provider]  folic acid  (FOLVITE ) 800 MCG tablet Place 800 mcg into feeding tube daily.    [provider]  HYDROcodone -acetaminophen  (NORCO/VICODIN) 5-325 MG tablet Take 1 tablet by mouth 2 (two) times daily as needed (for pain). 06/15/24   Elgergawy, Brayton RAMAN, MD  levETIRAcetam  (KEPPRA ) 100 MG/ML solution Place 1,000 mg into feeding tube 2 (two) times daily.  [provider]  liver oil-zinc  oxide (DESITIN) 40 % ointment Apply topically 2 (two) times daily. Apply Desitin around G tube site BID and change split thickness gauze each  time 05/31/24   Cheryle Page, MD  metoprolol  tartrate (LOPRESSOR ) 25 MG tablet Place 1 tablet (25 mg total) into feeding tube 2 (two) times daily. 12/03/22   Elgergawy, Brayton RAMAN, MD  Multiple Vitamin (MULTIVITAMIN WITH MINERALS) TABS tablet Place 1 tablet into feeding tube daily. 04/06/24   Tharon Lung, MD  Nutritional Supplements (FEEDING SUPPLEMENT, OSMOLITE 1.2 CAL,) LIQD Place 1,000 mLs into feeding tube continuous. 05/31/24   Cheryle Page, MD  nystatin  ointment (MYCOSTATIN ) Apply 1 Application topically 3 (three) times daily. 04/26/24   [provider]  polyethylene glycol (MIRALAX  / GLYCOLAX ) 17 g packet Place 34 g into feeding tube daily. 06/09/23   Rai, Nydia POUR, MD  Protein (FEEDING SUPPLEMENT, PROSOURCE TF20,) liquid Place 60 mLs into feeding tube daily. 06/01/24   Cheryle Page, MD  simethicone  (MYLICON) 80 MG chewable tablet Place 80 mg into feeding tube 3 (three) times daily.    [provider]  simvastatin  (ZOCOR ) 10 MG tablet Place 1 tablet (10 mg total) into feeding tube at bedtime. 02/22/22   Cheryle Page, MD  torsemide  (DEMADEX ) 10 MG tablet Place 10 mg into feeding tube every 8 (eight) hours. Give 10 mg per tube every 12 hours for edema, hold for a systolic reading less than 110    [provider]  valproic  acid (DEPAKENE ) 250 MG/5ML solution Place 10-15 mLs into feeding tube See admin instructions. 10 ml via tube three times a day and 15 ml at bedtime    [provider]  Water  For Irrigation, Sterile (FREE WATER ) SOLN Place 200 mLs into feeding tube every 4 (four) hours. 05/31/24   Cheryle Page, MD     Critical care time: 

## 2024-06-20 NOTE — Progress Notes (Signed)
 Pharmacy Antibiotic Note  Kathleen Jensen is a 73 y.o. female admitted on 06/19/2024 with sepsis concerns.  Pharmacy has been consulted for zosyn /vancomycin  dosing.  PMH:  11/7: PSA bacteremia (R-cipro ) treated with zosyn /vancomycin   10/25: admitted for PSA UTI, cellulitis around  g tube site (completed 7d of cipro ) 8/25: MRSE endocarditis, felt to be not a surgical candidate (cefadroxil  suppressive therapy)  -Tmax 105.9, WBC 12, sCr 1.38 (bl~0.6) -CTA: bilat patchy atelectasis of lungs -Blood cultures/MRSA PCR collected  Plan: -Zosyn  3.375gm IV every 8 hours -Vancomycin  2g IV x1 -Vancomycin  variable given AKI -Monitor renal function for dose adjustments -Follow up signs of clinical improvement, LOT, de-escalation of antibiotics   Height: 5' 10 (177.8 cm) Weight: 100 kg (220 lb 7.4 oz) IBW/kg (Calculated) : 68.5  Temp (24hrs), Avg:104.9 F (40.5 C), Min:103.9 F (39.9 C), Max:105.9 F (41.1 C)  Recent Labs  Lab 06/13/24 0630 06/14/24 0741 06/19/24 2052 06/19/24 2058 06/19/24 2332  WBC  --   --  12.0*  --   --   CREATININE 0.58 0.64 1.38*  --   --   LATICACIDVEN  --   --   --  7.4* 7.4*    Estimated Creatinine Clearance: 46.5 mL/min (A) (by C-G formula based on SCr of 1.38 mg/dL (H)).    Allergies  Allergen Reactions   Tape Rash   Demerol [Meperidine Hcl] Nausea And Vomiting   Sulfonamide Derivatives Itching   Bacitracin-Polymyxin B Dermatitis    Not listed on the [facility]   Oxycontin  [Oxycodone ] Itching    Antimicrobials this admission: Zosyn  11/23 >>  Vancomycin  11/22 >>   Microbiology results: 11/22 BCx:  11/22 MRSA PCR:   Thank you for allowing pharmacy to be a part of this patient's care.  Lynwood Poplar, PharmD, BCPS Clinical Pharmacist 06/20/2024 12:37 AM

## 2024-06-20 NOTE — Progress Notes (Addendum)
 eLink Physician-Brief Progress Note Patient Name: Kathleen Jensen DOB: [birth year] MRN: [id number]   Date of Service  06/20/2024  HPI/Events of Note  73 year old nonverbal female with a history of chronic hypoxic respiratory failure on 3 L with pacemaker endocarditis and previous seizures who presents to the hospital after recent discharge with Pseudomonas bacteremia presenting with fever and tachycardia concerning for PEG site or pacemaker lead infection.  She is febrile, tachypneic, tachycardic, and hypotensive on norepinephrine  infusion with PIV.  Results show lactic acidosis, elevated creatinine, leukocytosis and thrombocytopenia.  CT imaging with no acute findings.  Previous sensitivities reviewed  eICU Interventions  Increase ceiling of norepinephrine  to 50 mcg, and attempt fluid bolus, may need CVC  Maintain broad-spectrum biotics, cultures collected  Will need ongoing GOC with legal guardian rather than family.  Likely maintain most home meds  DVT prophylaxis with heparin  No indication for GI prophylaxis   0615 -lactic acid elevated at 7.1, ongoing IVF.  Continue to trend  Intervention Category Evaluation Type: New Patient Evaluation  Anise Harbin 06/20/2024, 2:29 AM

## 2024-06-20 NOTE — Progress Notes (Signed)
 Echocardiogram 2D Echocardiogram has been performed.  [name] RDCS 06/20/2024, 9:14 AM

## 2024-06-20 NOTE — Progress Notes (Signed)
 PHARMACY - PHYSICIAN COMMUNICATION CRITICAL VALUE ALERT - BLOOD CULTURE IDENTIFICATION (BCID)  Kathleen Jensen is an 72 y.o. female who presented to [facility] on 06/19/2024 with a chief complaint of sepsis, concerning for PEG site or pacemaker lead infection.   Assessment:  11/22 BCx: 2 of 4 bottles GNR. Per [employer], will not run BCID on this since previous result on same morphology done within the past 60 days.  - 11/7 BCx: 1 of 3 bottles Pseudomonas aeruginosa (no resistance detected)  Name of physician (or Provider) Contacted: Dr. Haze  Current antibiotics: Cefepime  + Vancomycin   Changes to prescribed antibiotics recommended:  Patient is on recommended antibiotics - No changes needed. Consider d/c vanc but will leave decision to day team.    Rocky Slade, PharmD, BCPS 06/20/2024  7:37 PM

## 2024-06-21 DIAGNOSIS — E119 Type 2 diabetes mellitus without complications: Secondary | ICD-10-CM

## 2024-06-21 DIAGNOSIS — A4102 Sepsis due to Methicillin resistant Staphylococcus aureus: Secondary | ICD-10-CM | POA: Diagnosis not present

## 2024-06-21 DIAGNOSIS — D696 Thrombocytopenia, unspecified: Secondary | ICD-10-CM | POA: Diagnosis not present

## 2024-06-21 DIAGNOSIS — D509 Iron deficiency anemia, unspecified: Secondary | ICD-10-CM | POA: Diagnosis not present

## 2024-06-21 DIAGNOSIS — R6521 Severe sepsis with septic shock: Secondary | ICD-10-CM | POA: Diagnosis not present

## 2024-06-21 LAB — BLOOD CULTURE ID PANEL (REFLEXED) - BCID2
A.calcoaceticus-baumannii: NOT DETECTED
Bacteroides fragilis: NOT DETECTED
CTX-M ESBL: NOT DETECTED
Candida albicans: NOT DETECTED
Candida auris: NOT DETECTED
Candida glabrata: NOT DETECTED
Candida krusei: NOT DETECTED
Candida parapsilosis: NOT DETECTED
Candida tropicalis: NOT DETECTED
Carbapenem resistance IMP: NOT DETECTED
Carbapenem resistance KPC: NOT DETECTED
Carbapenem resistance NDM: NOT DETECTED
Carbapenem resistance VIM: NOT DETECTED
Cryptococcus neoformans/gattii: NOT DETECTED
Enterobacter cloacae complex: NOT DETECTED
Enterobacterales: NOT DETECTED
Enterococcus Faecium: NOT DETECTED
Enterococcus faecalis: NOT DETECTED
Escherichia coli: NOT DETECTED
Haemophilus influenzae: NOT DETECTED
Klebsiella aerogenes: NOT DETECTED
Klebsiella oxytoca: NOT DETECTED
Klebsiella pneumoniae: NOT DETECTED
Listeria monocytogenes: NOT DETECTED
Methicillin resistance mecA/C: DETECTED — AB
Neisseria meningitidis: NOT DETECTED
Proteus species: NOT DETECTED
Pseudomonas aeruginosa: DETECTED — AB
Salmonella species: NOT DETECTED
Serratia marcescens: NOT DETECTED
Staphylococcus aureus (BCID): NOT DETECTED
Staphylococcus epidermidis: DETECTED — AB
Staphylococcus lugdunensis: NOT DETECTED
Staphylococcus species: DETECTED — AB
Stenotrophomonas maltophilia: NOT DETECTED
Streptococcus agalactiae: NOT DETECTED
Streptococcus pneumoniae: NOT DETECTED
Streptococcus pyogenes: NOT DETECTED
Streptococcus species: NOT DETECTED

## 2024-06-21 LAB — CBC
HCT: 29.8 % — ABNORMAL LOW (ref 36.0–46.0)
Hemoglobin: 9.3 g/dL — ABNORMAL LOW (ref 12.0–15.0)
MCH: 33.5 pg (ref 26.0–34.0)
MCHC: 31.2 g/dL (ref 30.0–36.0)
MCV: 107.2 fL — ABNORMAL HIGH (ref 80.0–100.0)
Platelets: 129 K/uL — ABNORMAL LOW (ref 150–400)
RBC: 2.78 MIL/uL — ABNORMAL LOW (ref 3.87–5.11)
RDW: 16.9 % — ABNORMAL HIGH (ref 11.5–15.5)
WBC: 22.4 K/uL — ABNORMAL HIGH (ref 4.0–10.5)
nRBC: 0.2 % (ref 0.0–0.2)

## 2024-06-21 LAB — BASIC METABOLIC PANEL WITH GFR
Anion gap: 12 (ref 5–15)
BUN: 20 mg/dL (ref 8–23)
CO2: 22 mmol/L (ref 22–32)
Calcium: 7.7 mg/dL — ABNORMAL LOW (ref 8.9–10.3)
Chloride: 104 mmol/L (ref 98–111)
Creatinine, Ser: 1.08 mg/dL — ABNORMAL HIGH (ref 0.44–1.00)
GFR, Estimated: 54 mL/min — ABNORMAL LOW (ref >60)
Glucose, Bld: 78 mg/dL (ref 70–99)
Potassium: 3.7 mmol/L (ref 3.5–5.1)
Sodium: 138 mmol/L (ref 135–145)

## 2024-06-21 LAB — LACTIC ACID, PLASMA
Lactic Acid, Venous: 6 mmol/L (ref 0.5–1.9)
Lactic Acid, Venous: 5.7 mmol/L (ref 0.5–1.9)

## 2024-06-21 LAB — GLUCOSE, CAPILLARY
Glucose-Capillary: 72 mg/dL (ref 70–99)
Glucose-Capillary: 84 mg/dL (ref 70–99)
Glucose-Capillary: 136 mg/dL — ABNORMAL HIGH (ref 70–99)
Glucose-Capillary: 165 mg/dL — ABNORMAL HIGH (ref 70–99)
Glucose-Capillary: 128 mg/dL — ABNORMAL HIGH (ref 70–99)
Glucose-Capillary: 171 mg/dL — ABNORMAL HIGH (ref 70–99)

## 2024-06-21 LAB — MAGNESIUM: Magnesium: 2.7 mg/dL — ABNORMAL HIGH (ref 1.7–2.4)

## 2024-06-21 LAB — PHOSPHORUS: Phosphorus: 2.4 mg/dL — ABNORMAL LOW (ref 2.5–4.6)

## 2024-06-21 MED ORDER — VASOPRESSIN 20 UNITS/100 ML INFUSION FOR SHOCK
0.0400 [IU]/min | INTRAVENOUS | Status: DC
  Filled 2024-06-21: qty 100

## 2024-06-21 MED ORDER — SODIUM CHLORIDE 0.9 % IV SOLN
2.0000 g | Freq: Three times a day (TID) | INTRAVENOUS | Status: DC
  Administered 2024-06-21 – 2024-06-22 (×3): 2 g via INTRAVENOUS
  Filled 2024-06-21 (×3): qty 12.5

## 2024-06-21 MED ORDER — SODIUM CHLORIDE 0.9 % IV SOLN
1.0000 g | Freq: Two times a day (BID) | INTRAVENOUS | Status: DC
  Administered 2024-06-21: 1 g via INTRAVENOUS
  Filled 2024-06-21: qty 20

## 2024-06-21 MED ORDER — OSMOLITE 1.5 CAL PO LIQD
1000.0000 mL | ORAL | Status: DC
  Administered 2024-06-21: 1000 mL
  Filled 2024-06-21: qty 1000

## 2024-06-21 MED ORDER — ASPIRIN 81 MG PO TBEC: 81.0000 mg | DELAYED_RELEASE_TABLET | Freq: Every day | ORAL | Status: DC

## 2024-06-21 MED ORDER — ACETAMINOPHEN 325 MG PO TABS
650.0000 mg | ORAL_TABLET | Freq: Four times a day (QID) | ORAL | Status: DC | PRN
  Administered 2024-06-21: 650 mg
  Filled 2024-06-21: qty 2

## 2024-06-21 MED ORDER — SODIUM CHLORIDE 0.9 % IV SOLN: 1.0000 g | Freq: Three times a day (TID) | INTRAVENOUS | Status: DC

## 2024-06-21 MED ORDER — POTASSIUM CHLORIDE 20 MEQ PO PACK
40.0000 meq | PACK | Freq: Once | ORAL | Status: AC
  Administered 2024-06-21: 40 meq
  Filled 2024-06-21: qty 2

## 2024-06-21 MED ORDER — LACTATED RINGERS IV BOLUS
500.0000 mL | Freq: Once | INTRAVENOUS | Status: AC
  Administered 2024-06-21: 500 mL via INTRAVENOUS

## 2024-06-21 MED ORDER — CHLORHEXIDINE GLUCONATE CLOTH 2 % EX PADS: 6.0000 | MEDICATED_PAD | Freq: Every day | CUTANEOUS | Status: DC

## 2024-06-21 MED ORDER — MUPIROCIN 2 % EX OINT
1.0000 | TOPICAL_OINTMENT | Freq: Two times a day (BID) | CUTANEOUS | Status: DC
  Administered 2024-06-21 – 2024-06-22 (×3): 1 via NASAL
  Filled 2024-06-21: qty 22

## 2024-06-21 MED ORDER — HYDROCODONE-ACETAMINOPHEN 5-325 MG PO TABS
1.0000 | ORAL_TABLET | Freq: Once | ORAL | Status: DC
Start: 2024-06-21 — End: 2024-06-21

## 2024-06-21 MED ORDER — VANCOMYCIN HCL 1250 MG/250ML IV SOLN
1250.0000 mg | INTRAVENOUS | Status: DC
  Administered 2024-06-21 – 2024-06-22 (×2): 1250 mg via INTRAVENOUS
  Filled 2024-06-21 (×2): qty 250

## 2024-06-21 MED ORDER — ASPIRIN 81 MG PO CHEW
81.0000 mg | CHEWABLE_TABLET | Freq: Every day | ORAL | Status: DC
  Administered 2024-06-21 – 2024-06-22 (×2): 81 mg
  Filled 2024-06-21 (×2): qty 1

## 2024-06-21 MED ORDER — HYDROCODONE-ACETAMINOPHEN 5-325 MG PO TABS
1.0000 | ORAL_TABLET | Freq: Once | ORAL | Status: AC
  Administered 2024-06-21: 1
  Filled 2024-06-21: qty 1

## 2024-06-21 NOTE — Progress Notes (Signed)
 NAME:  Kathleen Jensen, MRN:  [id number], DOB:  04/01/1951, LOS: 1 ADMISSION DATE:  06/19/2024, CONSULTATION DATE:  06/20/2024 REFERRING MD:  EDP, CHIEF COMPLAINT:  Sepsis    History of Present Illness:  This is a 73 yo F, chronically bedbound, prior cva (aphasic,  right sided hemiparesis) with a chronic PED tube for nutrition, chronic urinary retention with indwelling Foley catheter, seizure disorder, advanced dementia, stage IV sacral pressure ulcer, with multiple prior hospitalizations consisted of MRSA bacteremia, endocarditis 02/2024; Pseudomonas UTI 10/22; most recent hospitalization with pseudomonas bacteremia discharged on 11/18 after finishing 10 day course of IV abx.  Pt was transported back to her SNF without issue but re-presents shortly after on 11/22 with fever and tachycardia. EMS reports concern for infection at peg site or ppm leads (which is known and being chronically treated).    Pt offers no complaints and ros is unobtainable 2/2 pt's non verbal baseline  Pertinent  Medical History  H/o cva Sz d/o Presumed endocarditis, fibrinous strands on RA and RV leads Chronic macrocytic anemia Chronic thrombocytopenia Non verbal Chronic hypoxic resp failure on 3L [place] Chronic endocarditis (ppm infected) Chronic dysphagia with peg tube placement  Significant Hospital Events: Including procedures, antibiotic start and stop dates in addition to other pertinent events   11/23 Hypotensive despite fluid resuscitation requiring pressors support > admitted to ICU. MRSA nare positive  11/24 Blood cultures 11/22 > pseudomonas + staph epidermis   Interim History / Subjective:  O/N: Vaspressitin started, Levo maxxes. New fevers, + cultures, switched cefepime  -> mero; fevers 102.8   On exam, patient is moaning and groaning, minimally following commands.   Objective    Blood pressure (!) 89/57, pulse 92, temperature (!) 101 F (38.3 C), temperature source Axillary, resp. rate (!)  25, height 5' 10 (1.778 m), weight 107.6 kg, SpO2 95%.        Intake/Output Summary (Last 24 hours) at 06/21/2024 0659 Last data filed at 06/21/2024 0600 Gross per 24 hour  Intake 3213.66 ml  Output 2615 ml  Net 598.66 ml   Filed Weights   06/19/24 1948 06/20/24 0500 06/21/24 0500  Weight: 100 kg 107.9 kg 107.6 kg    Examination: General: appears chronically ill  HENT: MM  Lungs: clear Cardiovascular: RR Abdomen: soft, NT, ND  Extremities: warm to touch, no LE edema bilaterally  Neuro: Awake, moaning and groaning, minimally following commands  GU: Foley Cath   K 3.7 (3.4), Scr 1.08 (1.36) WBC 22.4 (20.7), Hgb 9.3 (28.3), PLT 129 (120)  Mah 2.7  CBG 72   Lactic acid 5.5 < 7.1 < 7.4   11/22 CT CHEST ABD PELVIS  IMPRESSION: 1. Bibasilar atelectasis is noted. No other acute abnormality is seen  CXR IMPRESSION: 1. Stable left mid lung atelectatic changes.  Resolved problem list   Assessment and Plan   Pseudomonas Bacteremia  Fevers  Worsening leukocytosis WBC 22.4 < 20.7  Hx of multiple hospitalizations in the past couple of months with pseudomonal infections; 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   Blood cultures 11/22 > Pseudomonas and Staph epidermidis, susceptibilities pending   MRSA nare positive RSV and UA negative  - Continue Cefepime  11/22 -  - Continue IV Vanc for MRSA  - Avoid meropenem  with history of seizure and currently on AED medications   Shock, presumed sepsis likely from infect AICD leads or PEG site (chronic) vs chronically colonized  Lactic Acidosis Lactic acid  6.0 (5.5)  Currently on max dose of levophed  with MAP goal > 60   Chronic PEG tube (jejunostomy catheter exchanged on 11/17 by IR)  Malnutrition  CT a/p wo obstruction; no fluctuation or discharge noted at the PEG site No TF currently.   CVA 2018 right-sided deficit chronic indwelling PEG  tube/Foley catheter - Continue aspirin  and statin   History of seizure disorder -Continue Keppra  1000 mg BID and Valproic  acid 750 at bedtime - F/u Valproic  acid levels - Cautious with seizure lowering medications     Paroxysmal A-fib, rate controlled  - Continue Amiodarone  200 daily - Continue Midodrine  20 TID   History of endocarditis/ MRSA bacteremia  - In the setting of AICD, recently discharged on lifelong cefadroxil .   Acute kidney injury   At discharge scr 0.64 Presented with 1.38, now improving 1.08  Avoid nephrotoxic agents such as IV contrast, NSAIDs, and phosphate containing bowel preps (FLEETS)  Chronic macrocytic anemia - Hgb stable 9.3 , MCV 107.2   Thrombocytopenia, chronic PLT 129 (120)   Diabetes mellitus type 2  CBG 72, no SSI needs   DVT: heparin   Prognosis: Multiple recent hospitalizations for bacteremia with multiple chronic sources that will result in re-hospitalizations in the future, poor prognosis. I have called [employer] [name], unable to reach.      Labs   CBC: Recent Labs  Lab 06/19/24 2052 06/20/24 0303 06/21/24 0545  WBC 12.0* 20.7* 22.4*  NEUTROABS 9.1*  --   --   HGB 10.7* 8.3* 9.3*  HCT 34.6* 26.5* 29.8*  MCV 109.8* 110.4* 107.2*  PLT 139* 120* 129*    Basic Metabolic Panel: Recent Labs  Lab 06/14/24 0741 06/19/24 2052 06/20/24 0303 06/21/24 0545  NA 131* 132* 132* 138  K 4.4 3.9 3.4* 3.7  CL 96* 96* 98 104  CO2 27 22 20* 22  GLUCOSE 103* 102* 86 PENDING  BUN 16 26* 27* 20  CREATININE 0.64 1.38* 1.36* 1.08*  CALCIUM  7.8* 7.7* 7.2* 7.7*  MG  --   --  1.6* 2.7*   GFR: Estimated Creatinine Clearance: 61.6 mL/min (A) (by C-G formula based on SCr of 1.08 mg/dL (H)). Recent Labs  Lab 06/19/24 2052 06/19/24 2058 06/19/24 2332 06/20/24 0303 06/20/24 0439 06/20/24 0752 06/21/24 0545  WBC 12.0*  --   --  20.7*  --   --  22.4*  LATICACIDVEN  --  7.4* 7.4*  --  7.1* 5.5*  --     Liver Function Tests: Recent  Labs  Lab 06/19/24 2052  AST 49*  ALT 13  ALKPHOS 223*  BILITOT 0.9  PROT 9.8*  ALBUMIN  <1.5*   No results for input(s): LIPASE, AMYLASE in the last 168 hours. No results for input(s): AMMONIA in the last 168 hours.  ABG    Component Value Date/Time   PHART 7.414 11/16/2022 1418   PCO2ART 56.4 (H) 11/16/2022 1418   PO2ART 335 (H) 11/16/2022 1418   HCO3 30.1 (H) 03/09/2023 2335   TCO2 28 05/19/2024 1455   ACIDBASEDEF 3.9 (H) 02/15/2022 2230   O2SAT 98 03/09/2023 2335     Coagulation Profile: Recent Labs  Lab 06/19/24 2052  INR 1.5*    Cardiac Enzymes: No results for input(s): CKTOTAL, CKMB, CKMBINDEX, TROPONINI in the last 168 hours.  HbA1C: Hgb A1c MFr Bld  Date/Time Value Ref Range Status  05/29/2024 03:35 AM 5.7 (H) 4.8 - 5.6 % Final    Comment:    (NOTE) Diagnosis of Diabetes The following HbA1c  ranges recommended by the American Diabetes Association (ADA) may be used as an aid in the diagnosis of diabetes mellitus.  Hemoglobin             Suggested A1C NGSP%              Diagnosis  <5.7                   Non Diabetic  5.7-6.4                Pre-Diabetic  >6.4                   Diabetic  <7.0                   Glycemic control for                       adults with diabetes.    11/26/2022 10:43 AM 5.4 4.8 - 5.6 % Final    Comment:    (NOTE) Pre diabetes:          5.7%-6.4%  Diabetes:              >6.4%  Glycemic control for   <7.0% adults with diabetes     CBG: Recent Labs  Lab 06/20/24 1134 06/20/24 1534 06/20/24 1939 06/20/24 2324 06/21/24 0319  GLUCAP 164* 144* 124* 94 72    Review of Systems:   As above   Past Medical History:  She,  has a past medical history of Aphasia, Arthritis, Asthma, Cardiac pacemaker, Cerebral amyloid angiopathy (CODE), CKD (chronic kidney disease), Cognitive communication deficit, Diabetes mellitus without complication (HCC), GERD (gastroesophageal reflux disease), Hemiplegia and  hemiparesis following unspecified cerebrovascular disease affecting right dominant side (HCC), Hyperlipemia, Hypertension, Insomnia, Intracerebral hemorrhage, intraventricular (HCC), Muscle weakness, Other abnormalities of gait and mobility, Seizures (HCC), Stroke (HCC), Unsteadiness on feet, and Vitamin B deficiency.   Surgical History:   Past Surgical History:  Procedure Laterality Date   BACK SURGERY     Dec 18, 2016 Dr.Torrealba   BREAST BIOPSY Right    BREAST EXCISIONAL BIOPSY     CERVICAL SPINE SURGERY     C4   CESAREAN SECTION     GASTRIC BYPASS  03/2007   IR GJ TUBE CHANGE  11/16/2023   IR REPLC DUODEN/JEJUNO TUBE PERCUT W/FLUORO  01/27/2023   IR REPLC DUODEN/JEJUNO TUBE PERCUT W/FLUORO  03/07/2023   IR REPLC DUODEN/JEJUNO TUBE PERCUT W/FLUORO  04/30/2023   IR REPLC DUODEN/JEJUNO TUBE PERCUT W/FLUORO  09/02/2023   IR REPLC DUODEN/JEJUNO TUBE PERCUT W/FLUORO  09/24/2023   IR REPLC DUODEN/JEJUNO TUBE PERCUT W/FLUORO  04/02/2024   IR REPLC DUODEN/JEJUNO TUBE PERCUT W/FLUORO  06/14/2024   TONSILLECTOMY     TRANSESOPHAGEAL ECHOCARDIOGRAM (CATH LAB) N/A 04/01/2024   Procedure: TRANSESOPHAGEAL ECHOCARDIOGRAM;  Surgeon: Lonni Slain, MD;  Location: [facility]AS[facility];  Service: Cardiovascular;  Laterality: N/A;     Social History:   reports that she quit smoking about 44 years ago. She started smoking about 48 years ago. She has never used smokeless tobacco. She reports that she does not drink alcohol  and does not use drugs.   Family History:  Her family history includes Cancer in her brother and sister; Diabetes in her father and mother; High blood pressure in her mother; Stroke in her father.   Allergies Allergies  Allergen Reactions   Tape Rash   Demerol [Meperidine Hcl] Nausea And Vomiting  Sulfonamide Derivatives Itching   Bacitracin-Polymyxin B Dermatitis    Not listed on the MAR   Oxycontin  [Oxycodone ] Itching     Home Medications  Prior to Admission  medications   Medication Sig Start Date End Date Taking? Authorizing Provider  acetaminophen  (TYLENOL ) 160 MG/5ML solution Place 20 mLs (640 mg total) into feeding tube every 8 (eight) hours. 12/03/22  Yes Elgergawy, Brayton RAMAN, MD  acetic acid  0.25 % irrigation 20cc irrigation twice a day, flush foley to prevent clogging 11/06/23  Yes [provider]  amiodarone  (PACERONE ) 200 MG tablet Place 1 tablet (200 mg total) into feeding tube daily. 12/03/22  Yes Elgergawy, Brayton RAMAN, MD  ascorbic acid  (VITAMIN C ) 500 MG tablet Place 0.5 tablets (250 mg total) into feeding tube 2 (two) times daily. 06/09/23  Yes Rai, Ripudeep K, MD  aspirin  81 MG chewable tablet Place 81 mg into feeding tube at bedtime.   Yes [provider]  cefadroxil  (DURICEF) 500 MG/5ML suspension Take 10 mLs (1,000 mg total) by mouth every 12 (twelve) hours. Patient taking differently: Place 1,000 mg into feeding tube every 12 (twelve) hours. 05/07/24 11/03/24 Yes Vu, Constance DASEN, MD  chlorhexidine  (PERIDEX ) 0.12 % solution Dab a toothbrush into 15 mLs and brush teeth 2 times a day   Yes [provider]  Cranberry 425 MG CAPS Give 425 mg by tube in the morning and at bedtime.   Yes [provider]  cyclobenzaprine  (FLEXERIL ) 5 MG tablet Place 2.5 mg into feeding tube in the morning and at bedtime. Hold If Sedated   Yes [provider]  docusate sodium  (COLACE) 100 MG capsule Give 1 capsule per tube twice a day   Yes [provider]  estradiol  (ESTRACE ) 0.1 MG/GM vaginal cream Place vaginally See admin instructions. Apply a pea-sized amount to the urethra/vaginal wall on Tuesdays and Fridays   Yes [provider]  famotidine  (PEPCID ) 40 MG/5ML suspension Place 40 mg into feeding tube daily. 03/25/22  Yes [provider]  folic acid  (FOLVITE ) 800 MCG tablet Place 800 mcg into feeding tube daily.   Yes [provider]  HYDROcodone -acetaminophen  (NORCO/VICODIN) 5-325 MG  tablet Take 1 tablet by mouth 2 (two) times daily as needed (for pain). Patient taking differently: Place 1 tablet into feeding tube 2 (two) times daily as needed (for pain). 06/15/24  Yes Elgergawy, Brayton RAMAN, MD  levETIRAcetam  (KEPPRA ) 100 MG/ML solution Place 1,000 mg into feeding tube 2 (two) times daily.   Yes [provider]  liver oil-zinc  oxide (DESITIN) 40 % ointment Apply topically 2 (two) times daily. Apply Desitin around G tube site BID and change split thickness gauze each time 05/31/24  Yes Cheryle Page, MD  metoprolol  tartrate (LOPRESSOR ) 25 MG tablet Place 1 tablet (25 mg total) into feeding tube 2 (two) times daily. 12/03/22  Yes Elgergawy, Brayton RAMAN, MD  Multiple Vitamin (MULTIVITAMIN WITH MINERALS) TABS tablet Place 1 tablet into feeding tube daily. 04/06/24  Yes Mabe, Elna, MD  Nutritional Supplements (FEEDING SUPPLEMENT, OSMOLITE 1.2 CAL,) LIQD Place 1,000 mLs into feeding tube continuous. 05/31/24  Yes Cheryle Page, MD  nystatin  ointment (MYCOSTATIN ) Apply 1 Application topically 3 (three) times daily. 04/26/24  Yes [provider]  OXYGEN Inhale 1-5 L into the lungs in the morning and at bedtime. To keep sats >92%   Yes [provider]  polyethylene glycol (MIRALAX  / GLYCOLAX ) 17 g packet Place 34 g into feeding tube daily. 06/09/23  Yes Rai, Nydia POUR, MD  Protein (FEEDING SUPPLEMENT, PROSOURCE TF20,) liquid Place 60 mLs into feeding tube daily. Patient taking differently: Place 45 mLs into feeding tube 3 (three) times daily. 06/01/24  Yes Cheryle Page, MD  simethicone  (MYLICON) 80 MG chewable tablet Place 80 mg into feeding tube 3 (three) times daily.   Yes [provider]  simvastatin  (ZOCOR ) 10 MG tablet Place 1 tablet (10 mg total) into feeding tube at bedtime. 02/22/22  Yes Cheryle Page, MD  torsemide  (DEMADEX ) 10 MG tablet Place 10 mg into feeding tube every 8 (eight) hours. Give 10 mg per tube every 12 hours for edema, hold for a  systolic reading less than 110   Yes [provider]  valproic  acid (DEPAKENE ) 250 MG/5ML solution Place 10-15 mLs into feeding tube See admin instructions. 10 ml via tube three times a day and 15 ml at bedtime   Yes [provider]  Water  For Irrigation, Sterile (FREE WATER ) SOLN Place 200 mLs into feeding tube every 4 (four) hours. Patient taking differently: Place 150 mLs into feeding tube every 4 (four) hours. 05/31/24  Yes Cheryle Page, MD  diazePAM , 20 MG Dose, (VALTOCO  20 MG DOSE) 2 x 10 MG/0.1ML LQPK Place 20 mg into the nose daily as needed (seizures). Patient not taking: Reported on 06/20/2024    [provider]     Critical care time:

## 2024-06-21 NOTE — Progress Notes (Signed)
 Initial Nutrition Assessment  DOCUMENTATION CODES:  Not applicable  INTERVENTION:  Initiate tube feeding via j-tube. Hold at trickle of 20mL per hour. When stable to advance, recommend the following: Osmolite 1.5 at 55 ml/h (1320 ml per day) Increase to 35 and advance by 10mL every 12 hours to reach goal Prosource TF20 60 ml 1x/d Provides 2060 kcal, 103 gm protein, 1006 ml free water  daily When TF at goal, add MVI regimen for hx of gastric bypass  NUTRITION DIAGNOSIS:  Increased nutrient needs related to acute illness as evidenced by estimated needs.  GOAL:  Patient will meet greater than or equal to 90% of their needs  MONITOR:  Weight trends, I & O's, Labs, TF tolerance  REASON FOR ASSESSMENT:  Consult Enteral/tube feeding initiation and management  ASSESSMENT:  Pt with hx of HTN, HLD, CKD, GERD, dementia, DM type 2, prior CVA w/ hemiplegia and contractures/aphasia, and hx of gastric bypass in 2008 presented to ED with septic shock. Recently admitted 11/7-11/18 and 10/28-11/3 with similar presentations.  Patient resting in bed at the time of assessment. Opens eyes when spoken to, but does not otherwise interact. Reviewed chart as pt was recently hospitalized for septic shock and discharging provider discussed with family that pt was at high risk for readmission and decline. Family has previously expressed interest in exploring a more comfort based approach.   For now, will initiate trickle feeds to avoid hypoglycemia. Pt chronically on Osmolite 1.5 via her j-tube. RN reports slowly coming down on pressor support and MD to repeat lactic acid this afternoon to ensure trending down.   Pt with significant fluid on exam. No muscle or fat depletions palpated, but highly likely these are being masked by edema. Do have concern for malnutrition with pt's frequent recent admissions.   MAP (cuff): 54-88mmHg  Admit weight: 100 kg (copied from last encounter)  Current weight: 107.6 kg     Intake/Output Summary (Last 24 hours) at 06/21/2024 1643 Last data filed at 06/21/2024 1500 Gross per 24 hour  Intake 1812.14 ml  Output 315 ml  Net 1497.14 ml  Net IO Since Admission: 6,201.35 mL [06/21/24 1643]  Drains/Lines: J-tube, 20 fr. UOP x 24 hours  Nutritionally Relevant Medications: Scheduled Meds:  cyclobenzaprine   2.5 mg Per Tube BID   folic acid   1,000 mcg Per Tube QHS   levETIRAcetam   1,000 mg Per Tube BID   multivitamin with minerals  1 tablet Per Tube Daily   simvastatin   10 mg Per Tube QHS   Continuous Infusions:  ceFEPime  (MAXIPIME ) IV 200 mL/hr at 06/21/24 1400   norepinephrine  (LEVOPHED ) Adult infusion 13 mcg/min (06/21/24 1400)   vancomycin  Stopped (06/21/24 1332)   PRN Meds: docusate sodium , polyethylene glycol  Labs Reviewed: Creatinine 1.08 Magnesium  2.7 Lactic acid: 7.1 >> 5.5 >> 6.0 (most recent) CBG ranges from 72-165 mg/dL over the last 24 hours HgbA1c 5.7%  NUTRITION - FOCUSED PHYSICAL EXAM: Flowsheet Row Most Recent Value  Orbital Region No depletion  Upper Arm Region No depletion  Thoracic and Lumbar Region No depletion  Buccal Region No depletion  Temple Region No depletion  Clavicle Bone Region No depletion  Clavicle and Acromion Bone Region No depletion  Scapular Bone Region No depletion  Dorsal Hand No depletion  Patellar Region No depletion  Anterior Thigh Region No depletion  Posterior Calf Region Moderate depletion  Edema (RD Assessment) Severe  Hair Reviewed  Eyes Reviewed  Mouth Reviewed  Skin Reviewed  Nails Reviewed  Diet Order:   Diet Order             Diet NPO time specified Except for: Sips with Meds  Diet effective now                   EDUCATION NEEDS:  Not appropriate for education at this time  Skin:  Skin Assessment: Reviewed RN Assessment  Last BM:  11/23 - type 7  Height:  Ht Readings from Last 1 Encounters:  06/19/24 5' 10 (1.778 m)    Weight:  Wt Readings from Last  1 Encounters:  06/21/24 107.6 kg    Ideal Body Weight:  68.2 kg  BMI:  Body mass index is 34.04 kg/m.  Estimated Nutritional Needs:  Kcal:  1900-2100 kcal/d Protein:  100-115g/d Fluid:  2L/d    Kathleen Jensen, RD, LDN, CNSC Registered Dietitian II Please reach out via secure chat

## 2024-06-21 NOTE — Progress Notes (Addendum)
 eLink Physician-Brief Progress Note Patient Name: Dashana Guizar DOB: Nov 14, 1950 MRN: [id number]   Date of Service  06/21/2024  HPI/Events of Note  Moaning, tachycardic, hypertensive  Flexaril and gabapentin   eICU Interventions  Add one time vicodin, PRN bid at home   0353 - Add tylenol  for fever  0540 - Max periphreal levo, add vaso. Lr bolus. new fevers, get new cultures - GNR in last set with known pseudomonal bacteremia. Switch cefepime  to mero until sensitivities return  Intervention Category Intermediate Interventions: Pain - evaluation and management  Aniyiah Zell 06/21/2024, 12:09 AM

## 2024-06-21 NOTE — Progress Notes (Signed)
 PHARMACY - PHYSICIAN COMMUNICATION CRITICAL VALUE ALERT - BLOOD CULTURE IDENTIFICATION (BCID)  Kathleen Jensen is an 73 y.o. female who presented to [facility] on 06/19/2024 with a chief complaint of fever and tachycardia.  Assessment:  Blood culture resulted with 1/4 bottles MRSE. Would normally consider contaminant, but could be true result in setting of Hx MRSE bacteremia and infected pacemaker. Is on vancomycin  which would cover for this organism.   Name of physician (or Provider) Contacted: Dr. Patsy  Current antibiotics: Vancomycin  + meropenem   Changes to prescribed antibiotics recommended:  No changes based on this result.  Results for orders placed or performed during the hospital encounter of 06/19/24  Blood Culture ID Panel (Reflexed) (Collected: 06/19/2024  7:47 PM)  Result Value Ref Range   Enterococcus faecalis NOT DETECTED NOT DETECTED   Enterococcus Faecium NOT DETECTED NOT DETECTED   Listeria monocytogenes NOT DETECTED NOT DETECTED   Staphylococcus species DETECTED (A) NOT DETECTED   Staphylococcus aureus (BCID) NOT DETECTED NOT DETECTED   Staphylococcus epidermidis DETECTED (A) NOT DETECTED   Staphylococcus lugdunensis NOT DETECTED NOT DETECTED   Streptococcus species NOT DETECTED NOT DETECTED   Streptococcus agalactiae NOT DETECTED NOT DETECTED   Streptococcus pneumoniae NOT DETECTED NOT DETECTED   Streptococcus pyogenes NOT DETECTED NOT DETECTED   A.calcoaceticus-baumannii NOT DETECTED NOT DETECTED   Bacteroides fragilis NOT DETECTED NOT DETECTED   Enterobacterales NOT DETECTED NOT DETECTED   Enterobacter cloacae complex NOT DETECTED NOT DETECTED   Escherichia coli NOT DETECTED NOT DETECTED   Klebsiella aerogenes NOT DETECTED NOT DETECTED   Klebsiella oxytoca NOT DETECTED NOT DETECTED   Klebsiella pneumoniae NOT DETECTED NOT DETECTED   Proteus species NOT DETECTED NOT DETECTED   Salmonella species NOT DETECTED NOT DETECTED   Serratia marcescens NOT  DETECTED NOT DETECTED   Haemophilus influenzae NOT DETECTED NOT DETECTED   Neisseria meningitidis NOT DETECTED NOT DETECTED   Pseudomonas aeruginosa DETECTED (A) NOT DETECTED   Stenotrophomonas maltophilia NOT DETECTED NOT DETECTED   Candida albicans NOT DETECTED NOT DETECTED   Candida auris NOT DETECTED NOT DETECTED   Candida glabrata NOT DETECTED NOT DETECTED   Candida krusei NOT DETECTED NOT DETECTED   Candida parapsilosis NOT DETECTED NOT DETECTED   Candida tropicalis NOT DETECTED NOT DETECTED   Cryptococcus neoformans/gattii NOT DETECTED NOT DETECTED   CTX-M ESBL NOT DETECTED NOT DETECTED   Carbapenem resistance IMP NOT DETECTED NOT DETECTED   Carbapenem resistance KPC NOT DETECTED NOT DETECTED   Methicillin resistance mecA/C DETECTED (A) NOT DETECTED   Carbapenem resistance NDM NOT DETECTED NOT DETECTED   Carbapenem resistance VIM NOT DETECTED NOT DETECTED   Maurilio Patten, PharmD PGY1 Pharmacy Resident [facility] 06/21/2024 10:40 AM

## 2024-06-21 NOTE — Plan of Care (Signed)
  Problem: Clinical Measurements: Goal: Will remain free from infection Outcome: Progressing   Problem: Clinical Measurements: Goal: Cardiovascular complication will be avoided Outcome: Progressing   Problem: Nutrition: Goal: Adequate nutrition will be maintained Outcome: Progressing   Problem: Coping: Goal: Level of anxiety will decrease Outcome: Progressing   Problem: Elimination: Goal: Will not experience complications related to bowel motility Outcome: Progressing   Problem: Safety: Goal: Ability to remain free from injury will improve Outcome: Progressing

## 2024-06-21 NOTE — Progress Notes (Signed)
 Pharmacy Antibiotic Note  Kathleen Jensen is a 73 y.o. female admitted on 06/19/2024 with sepsis concerns.  Pharmacy has been consulted for vancomycin  dosing.  PMH:  11/7: PSA bacteremia (R-cipro ) treated with zosyn /vancomycin   10/25: admitted for PSA UTI, cellulitis around  g tube site (completed 7d of cipro ) 8/25: MRSE endocarditis, felt to be not a surgical candidate (cefadroxil  suppressive therapy)  -Tmax 105.9 > 102.8, WBC 12>22.4, sCr 1.38>1.08 (bl~0.6) -CTA: bilat patchy atelectasis of lungs  Plan: -Vancomycin  1250 mg q24h for AUC of 504 (Vd 0.5)  -Monitor renal function for dose adjustments -Follow up signs of clinical improvement, LOT, de-escalation of antibiotics   Height: 5' 10 (177.8 cm) Weight: 107.6 kg (237 lb 3.4 oz) IBW/kg (Calculated) : 68.5  Temp (24hrs), Avg:99 F (37.2 C), Min:96.1 F (35.6 C), Max:102.8 F (39.3 C)  Recent Labs  Lab 06/19/24 2052 06/19/24 2058 06/19/24 2332 06/20/24 0303 06/20/24 0439 06/20/24 0752 06/21/24 0545 06/21/24 0830  WBC 12.0*  --   --  20.7*  --   --  22.4*  --   CREATININE 1.38*  --   --  1.36*  --   --  1.08*  --   LATICACIDVEN  --  7.4* 7.4*  --  7.1* 5.5*  --  6.0*    Estimated Creatinine Clearance: 61.6 mL/min (A) (by C-G formula based on SCr of 1.08 mg/dL (H)).    Allergies  Allergen Reactions   Tape Rash   Demerol [Meperidine Hcl] Nausea And Vomiting   Sulfonamide Derivatives Itching   Bacitracin-Polymyxin B Dermatitis    Not listed on the [facility]   Oxycontin  [Oxycodone ] Itching    Antimicrobials this admission: Cefepime  11/22 >> 11/23  Meropenem  11/24 >>  Vancomycin  11/22 >>   Microbiology results: 11/22 BCx: Pseudomonas aeruginosa, staphylococcus epidermidis  11/22 MRSA PCR: detected   Thank you for allowing pharmacy to be a part of this patient's care.  Rankin Sams, PharmD, BCPS, BCCCP Clinical Pharmacist

## 2024-06-21 NOTE — Progress Notes (Signed)
 Pharmacy Electrolyte Replacement  Recent Labs:  Recent Labs    06/21/24 0545  K 3.7  MG 2.7*  CREATININE 1.08*    Low Critical Values (K </= 2.5, Phos </= 1, Mg </= 1) Present: None  MD Contacted: n/a  Plan: 40 mEq KCL per tube x1 per protocol   Rankin Sams, PharmD, BCPS, BCCCP Clinical Pharmacist

## 2024-06-21 NOTE — Progress Notes (Addendum)
 3:40 PM: I called and spoke with the legal guardian, [name] Search, about patient's current treatment plan and poor prognosis. [name] reported that she would like patient's daughters input prior to making any decision because per the last hospitalization, family wanted to continue treatment expect in DNR-intervention  3:51 PM: I called and spoke with [name] and notified her about patient's current state and treatment. [name] expressed understanding her mother's condition and reported that she would like to consider comfort care and bring her mother home. She expressed optimizing comfort of her mother.   4:18 PM: I called back [name] to arrange family meeting and she reported that her schedule is book tomorrow, but she is available on Wednesday. On the other hand, she is able to reach out to patient's daughters tomorrow and speak with them about consideration of comfort care and hospice. She asked our team to reach out to her @ 14:30 PM 11/25 to discuss patient's care.    For now: - Continue current measures

## 2024-06-21 NOTE — TOC Initial Note (Addendum)
 Transition of Care [facility]) - Initial/Assessment Note    Patient Details  Name: Kathleen Jensen MRN: [id number] Date of Birth: [birth year]  Transition [facility]) CM/SW Contact:    Lauraine FORBES Saa, LCSWA Phone Number: 06/21/2024, 9:20 AM  Clinical Narrative:                  9:20 AM Per chart review, patient is from [facility]. SNF confirmed patient is LTC at [facility]. Patient has a PCP and insurance. Patient has HH history with [facility]. Patient has DME (transfer board, hospital bed, shower stool, trapeze history with [facility]. Patient's preferred pharmacy is [facility]. CSW will send FL2 to SNF upon PT/OT evaluations. CSW will continue to follow.  Expected Discharge Plan: Long Term Nursing Home Barriers to Discharge: Continued Medical Work up   Patient Goals and CMS Choice            Expected Discharge Plan and Services In-house Referral: Clinical Social Work   Post Acute Care Choice: Skilled Nursing Facility Living arrangements for the past 2 months: Skilled Nursing Facility                                      Prior Living Arrangements/Services Living arrangements for the past 2 months: Skilled Nursing Facility Lives with:: Facility Resident Patient language and need for interpreter reviewed:: Yes        Need for Family Participation in Patient Care: Yes (Comment) Care giver support system in place?: Yes (comment) Current home services: DME Criminal Activity/Legal Involvement Pertinent to Current Situation/Hospitalization: No - Comment as needed  Activities of Daily Living      Permission Sought/Granted Permission sought to share information with : Facility Medical Sales Representative, Guardian Permission granted to share information with : No (Contact information on chart)  Share Information with NAME: [name] Search  Permission granted to share info w AGENCY: [facility] LTC  Permission granted to share info w Relationship: Legal  Guardian  Permission granted to share info w Contact Information: [phone]  Emotional Assessment   Attitude/Demeanor/Rapport: Unable to Assess Affect (typically observed): Unable to Assess   Alcohol  / Substance Use: Not Applicable Psych Involvement: No (comment)  Admission diagnosis:  Shock (HCC) [R57.9] Septic shock (HCC) [A41.9, R65.21] Patient Active Problem List   Diagnosis Date Noted   Shock (HCC) 06/20/2024   Palliative care encounter 05/21/2024   Goals of care, counseling/discussion 05/21/2024   Counseling and coordination of care 05/21/2024   Need for emotional support 05/21/2024   Bedbound 05/21/2024   Complication of feeding tube (HCC) 05/21/2024   Sepsis associated hypotension (HCC) 05/20/2024   Elevated lactic acid level 05/19/2024   Hypoglycemia 04/01/2024   Infection and inflammatory reaction due to cardiac device, implant, and graft 04/01/2024   Hypocalcemia 03/31/2024   PEG tube malfunction (HCC) 03/31/2024   Bacteremia 03/25/2024   Chronic health problem 03/25/2024   Ileus (HCC) 06/06/2023   Acute cystitis 03/01/2023   Delirium secondary to UTI 03/01/2023   Atrial fibrillation, chronic (HCC) 03/01/2023   History of seizure 03/01/2023   Chronic hyponatremia 03/01/2023   Acute respiratory failure (HCC) 11/16/2022   Abdominal distension 11/16/2022   AMS (altered mental status) 05/17/2022   Altered mental status 05/15/2022   Sacral wound 05/15/2022   Intracranial hemorrhage (HCC)    Pressure injury of skin 02/18/2022   Chronic indwelling Foley catheter 02/18/2022   PAF (paroxysmal  atrial fibrillation) (HCC) 02/18/2022   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HCC) 02/18/2022   Septic shock (HCC) 02/15/2022   Constipation    Abdominal pain    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   History of right hemiplegia (HCC) 04/12/2020   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   Hyperlipidemia associated with type 2  diabetes mellitus (HCC) 07/30/2018   History of CVA (cerebrovascular accident) 07/30/2018   Vitamin B 12 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/30/2018   Asthma 03/13/2018   Seizures (HCC) 01/12/2018   CVA (cerebral vascular accident) (HCC) 12/08/2017   Abnormal thyroid  blood test 12/08/2017   Pulmonary embolism and infarction (HCC) 12/08/2017   Chronic anemia 12/08/2017   History of expressive aphasia 10/15/2017   Seizure disorder (HCC) 10/15/2017   Dysphagia 10/15/2017   Hyperlipidemia 09/22/2017   Urinary frequency 09/22/2017   Preoperative clearance 11/19/2016   Airway hyperreactivity 01/26/2016   Disease of thyroid  gland 01/26/2016   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   Other specified postprocedural states 01/26/2016   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   Chronic low back pain 04/10/2015   Degeneration of intervertebral disc of lumbar region 04/10/2015   Spondylolisthesis of lumbar region 04/10/2015   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   History of cardiac pacemaker in situ 01/09/2015   B12 DEFICIENCY 08/15/2008   Vitamin D  deficiency 08/15/2008   Sick sinus syndrome (HCC) 08/15/2008   OBSTRUCTIVE SLEEP APNEA 08/11/2008   INSOMNIA 04/28/2008   Hyperlipidemia LDL goal <70 12/12/2006   RHINITIS, ALLERGIC NEC 12/12/2006   Hypothyroidism 12/11/2006   Essential hypertension 12/11/2006   GERD 12/11/2006   STRESS INCONTINENCE 12/11/2006   PCP:  [facility]:   [facility] [place], [place] - [address] [place] [address] Phone: [phone] Fax: [phone]     Social Drivers of Health (SDOH) Social History: SDOH Screenings   Food Insecurity: Patient Unable To Answer (05/20/2024)  Housing: Patient Unable To Answer (05/20/2024)  Transportation Needs: Patient Unable To Answer (03/25/2024)  Utilities: Patient Unable To Answer (03/25/2024)  Depression (PHQ2-9): Low Risk  (05/22/2019)  Social Connections: Unknown (03/25/2024)  Tobacco Use: Medium Risk  (06/19/2024)   SDOH Interventions:     Readmission Risk Interventions    06/15/2024    9:21 AM 05/31/2024   12:39 PM 05/21/2024    1:03 PM  Readmission Risk Prevention Plan  Transportation Screening Complete Complete Complete  Medication Review Oceanographer) Complete Complete Complete  PCP or Specialist appointment within 3-5 days of discharge Complete Complete Complete  HRI or Home Care Consult Complete Complete Complete  SW Recovery Care/Counseling Consult Complete Complete Complete  Palliative Care Screening Complete Complete Complete  Skilled Nursing Facility Complete Not Applicable Not Applicable

## 2024-06-21 NOTE — Hospital Course (Signed)
 73 yo female presented with concerns for sepsis per EMS. Pt is non-verbal at baseline. Pt was recently admitted for sepsis to [facility] and discharged on 11/18. During that admission it was noted that she had pseudomonas bacteremia and completed 10 days of abx for prior to discharge. Pt was transported back to her SNF without issue but re-presents shortly after on 11/22 with fever and tachycardia. EMS reports concern for infection at peg site or ppm leads (which is known and being chronically treated).    Pt offers no complaints and ros is unobtainable 2/2 pt's non verbal baseline

## 2024-06-22 ENCOUNTER — Inpatient Hospital Stay (HOSPITAL_COMMUNITY)

## 2024-06-22 DIAGNOSIS — A419 Sepsis, unspecified organism: Secondary | ICD-10-CM | POA: Diagnosis not present

## 2024-06-22 DIAGNOSIS — A4102 Sepsis due to Methicillin resistant Staphylococcus aureus: Secondary | ICD-10-CM | POA: Diagnosis not present

## 2024-06-22 DIAGNOSIS — D696 Thrombocytopenia, unspecified: Secondary | ICD-10-CM | POA: Diagnosis not present

## 2024-06-22 DIAGNOSIS — R579 Shock, unspecified: Secondary | ICD-10-CM

## 2024-06-22 DIAGNOSIS — R6521 Severe sepsis with septic shock: Secondary | ICD-10-CM | POA: Diagnosis not present

## 2024-06-22 DIAGNOSIS — Z7189 Other specified counseling: Secondary | ICD-10-CM

## 2024-06-22 DIAGNOSIS — Z515 Encounter for palliative care: Secondary | ICD-10-CM | POA: Diagnosis not present

## 2024-06-22 DIAGNOSIS — Z789 Other specified health status: Secondary | ICD-10-CM

## 2024-06-22 DIAGNOSIS — Z66 Do not resuscitate: Secondary | ICD-10-CM

## 2024-06-22 DIAGNOSIS — D509 Iron deficiency anemia, unspecified: Secondary | ICD-10-CM | POA: Diagnosis not present

## 2024-06-22 LAB — CBC
HCT: 26.9 % — ABNORMAL LOW (ref 36.0–46.0)
Hemoglobin: 8.5 g/dL — ABNORMAL LOW (ref 12.0–15.0)
MCH: 33.6 pg (ref 26.0–34.0)
MCHC: 31.6 g/dL (ref 30.0–36.0)
MCV: 106.3 fL — ABNORMAL HIGH (ref 80.0–100.0)
Platelets: 117 K/uL — ABNORMAL LOW (ref 150–400)
RBC: 2.53 MIL/uL — ABNORMAL LOW (ref 3.87–5.11)
RDW: 16.9 % — ABNORMAL HIGH (ref 11.5–15.5)
WBC: 20.3 K/uL — ABNORMAL HIGH (ref 4.0–10.5)
nRBC: 0.1 % (ref 0.0–0.2)

## 2024-06-22 LAB — GLUCOSE, CAPILLARY
Glucose-Capillary: 204 mg/dL — ABNORMAL HIGH (ref 70–99)
Glucose-Capillary: 190 mg/dL — ABNORMAL HIGH (ref 70–99)
Glucose-Capillary: 142 mg/dL — ABNORMAL HIGH (ref 70–99)

## 2024-06-22 LAB — CULTURE, BLOOD (ROUTINE X 2)
Special Requests: ADEQUATE
Special Requests: ADEQUATE

## 2024-06-22 LAB — MAGNESIUM: Magnesium: 2.8 mg/dL — ABNORMAL HIGH (ref 1.7–2.4)

## 2024-06-22 LAB — PHOSPHORUS: Phosphorus: 1.4 mg/dL — ABNORMAL LOW (ref 2.5–4.6)

## 2024-06-22 LAB — BASIC METABOLIC PANEL WITH GFR
Anion gap: 8 (ref 5–15)
BUN: 22 mg/dL (ref 8–23)
CO2: 23 mmol/L (ref 22–32)
Calcium: 7.4 mg/dL — ABNORMAL LOW (ref 8.9–10.3)
Chloride: 106 mmol/L (ref 98–111)
Creatinine, Ser: 0.91 mg/dL (ref 0.44–1.00)
GFR, Estimated: >60 mL/min (ref >60)
Glucose, Bld: 208 mg/dL — ABNORMAL HIGH (ref 70–99)
Potassium: 3.8 mmol/L (ref 3.5–5.1)
Sodium: 137 mmol/L (ref 135–145)

## 2024-06-22 LAB — VALPROIC ACID LEVEL: Valproic Acid Lvl: 22 ug/mL — ABNORMAL LOW (ref 50–100)

## 2024-06-22 LAB — LACTIC ACID, PLASMA: Lactic Acid, Venous: 2.9 mmol/L (ref 0.5–1.9)

## 2024-06-22 MED ORDER — LORAZEPAM 2 MG/ML PO CONC: 1.0000 mg | ORAL | Status: DC | PRN

## 2024-06-22 MED ORDER — LORAZEPAM 1 MG PO TABS
1.0000 mg | ORAL_TABLET | ORAL | Status: DC | PRN
Start: 2024-06-22 — End: 2024-06-22

## 2024-06-22 MED ORDER — LORAZEPAM 1 MG PO TABS: 1.0000 mg | ORAL_TABLET | ORAL | Status: DC | PRN

## 2024-06-22 MED ORDER — INSULIN ASPART 100 UNIT/ML IJ SOLN
0.0000 [IU] | Freq: Three times a day (TID) | INTRAMUSCULAR | Status: DC
  Administered 2024-06-22: 1 [IU] via SUBCUTANEOUS
  Filled 2024-06-22: qty 1

## 2024-06-22 MED ORDER — ONDANSETRON HCL 4 MG/2ML IJ SOLN: 4.0000 mg | Freq: Four times a day (QID) | INTRAMUSCULAR | Status: DC | PRN

## 2024-06-22 MED ORDER — MORPHINE SULFATE (CONCENTRATE) 10 MG /0.5 ML PO SOLN
5.0000 mg | ORAL | Status: DC | PRN
  Administered 2024-06-23 (×2): 10 mg
  Filled 2024-06-22 (×2): qty 0.5

## 2024-06-22 MED ORDER — GLYCOPYRROLATE 0.2 MG/ML IJ SOLN: 0.2000 mg | INTRAMUSCULAR | Status: DC | PRN

## 2024-06-22 MED ORDER — MIDODRINE HCL 5 MG PO TABS: 20.0000 mg | ORAL_TABLET | Freq: Three times a day (TID) | ORAL | Status: DC

## 2024-06-22 MED ORDER — HALOPERIDOL LACTATE 2 MG/ML PO CONC: 2.0000 mg | Freq: Four times a day (QID) | ORAL | Status: DC | PRN

## 2024-06-22 MED ORDER — HALOPERIDOL LACTATE 5 MG/ML IJ SOLN: 2.0000 mg | Freq: Four times a day (QID) | INTRAMUSCULAR | Status: DC | PRN

## 2024-06-22 MED ORDER — GLYCOPYRROLATE 1 MG PO TABS: 1.0000 mg | ORAL_TABLET | ORAL | Status: DC | PRN

## 2024-06-22 MED ORDER — HALOPERIDOL 1 MG PO TABS: 2.0000 mg | ORAL_TABLET | Freq: Four times a day (QID) | ORAL | Status: DC | PRN

## 2024-06-22 MED ORDER — DIPHENHYDRAMINE HCL 50 MG/ML IJ SOLN: 25.0000 mg | INTRAMUSCULAR | Status: DC | PRN

## 2024-06-22 MED ORDER — SODIUM PHOSPHATES 45 MMOLE/15ML IV SOLN
45.0000 mmol | Freq: Once | INTRAVENOUS | Status: AC
  Administered 2024-06-22: 45 mmol via INTRAVENOUS
  Filled 2024-06-22: qty 15

## 2024-06-22 MED ORDER — ACETAMINOPHEN 650 MG RE SUPP: 650.0000 mg | Freq: Four times a day (QID) | RECTAL | Status: DC | PRN

## 2024-06-22 MED ORDER — LORAZEPAM 2 MG/ML IJ SOLN: 1.0000 mg | INTRAMUSCULAR | Status: DC | PRN

## 2024-06-22 MED ORDER — GLYCOPYRROLATE 0.2 MG/ML IJ SOLN
0.2000 mg | INTRAMUSCULAR | Status: DC | PRN
  Administered 2024-06-23: 0.2 mg via INTRAVENOUS
  Filled 2024-06-22: qty 1

## 2024-06-22 MED ORDER — LORAZEPAM 2 MG/ML PO CONC
1.0000 mg | ORAL | Status: DC | PRN
Start: 2024-06-22 — End: 2024-06-22

## 2024-06-22 MED ORDER — ACETAMINOPHEN 325 MG PO TABS: 650.0000 mg | ORAL_TABLET | Freq: Four times a day (QID) | ORAL | Status: DC | PRN

## 2024-06-22 MED ORDER — POLYVINYL ALCOHOL 1.4 % OP SOLN: 1.0000 [drp] | Freq: Four times a day (QID) | OPHTHALMIC | Status: DC | PRN

## 2024-06-22 MED ORDER — ONDANSETRON 4 MG PO TBDP: 4.0000 mg | ORAL_TABLET | Freq: Four times a day (QID) | ORAL | Status: DC | PRN

## 2024-06-22 MED ORDER — SODIUM CHLORIDE 0.9 % IV SOLN
2.0000 g | Freq: Three times a day (TID) | INTRAVENOUS | Status: DC
  Administered 2024-06-22: 2 g via INTRAVENOUS
  Filled 2024-06-22 (×2): qty 2

## 2024-06-22 MED ORDER — INSULIN ASPART 100 UNIT/ML IJ SOLN: 0.0000 [IU] | Freq: Every day | INTRAMUSCULAR | Status: DC

## 2024-06-22 MED ORDER — INSULIN ASPART 100 UNIT/ML IJ SOLN
0.0000 [IU] | INTRAMUSCULAR | Status: DC
  Administered 2024-06-22: 2 [IU] via SUBCUTANEOUS
  Administered 2024-06-22: 1 [IU] via SUBCUTANEOUS
  Filled 2024-06-22: qty 1
  Filled 2024-06-22: qty 2

## 2024-06-22 MED ORDER — BIOTENE DRY MOUTH MT LIQD
15.0000 mL | Freq: Two times a day (BID) | OROMUCOSAL | Status: DC
  Administered 2024-06-22 – 2024-06-23 (×2): 15 mL via TOPICAL

## 2024-06-22 NOTE — Progress Notes (Signed)
 Pharmacy Electrolyte Replacement  Recent Labs:  Recent Labs    06/22/24 0454  K 3.8  MG 2.8*  PHOS 1.4*  CREATININE 0.91    Low Critical Values (K </= 2.5, Phos </= 1, Mg </= 1) Present: None  MD Contacted: n/a  Plan: 45 mmol NaPhos x1 per protocol   Rankin Sams, PharmD, BCPS, BCCCP Clinical Pharmacist

## 2024-06-22 NOTE — Hospital Course (Signed)
 Two daughters - Comfort Care - Her home,[place]  - Was at [facility] facility  Medicaid/Medicare [employer]  [facility] + Hospice    Goals: - Full Comfort care - DNR/DNI

## 2024-06-22 NOTE — TOC Progression Note (Addendum)
 Transition of Care [facility]) - Progression Note    Patient Details  Name: Kathleen Jensen MRN: [id number] Date of Birth: Jan 06, 1951  [facility]) CM/SW Contact  Lauraine FORBES Saa, LCSWA Phone Number: 06/22/2024, 3:44 PM  Clinical Narrative:     3:44 PM PMT informed medical team that patient's legal guardian and family expressed preference in patient returning to [facility] SNF with comfort care/hospice services that are in contract with SNF. CSW made SNF aware, who informed CSW that they are contracted with [facility]. CSW informed [facility] of hospice needs. CSW sent patient's FL2 to [facility]. CSW will continue to follow.  Expected Discharge Plan: Long Term Nursing Home Barriers to Discharge: Continued Medical Work up               Expected Discharge Plan and Services In-house Referral: Clinical Social Work   Post Acute Care Choice: Hospice, Skilled Nursing Facility Living arrangements for the past 2 months: Skilled Nursing Facility                                       Social Drivers of Health (SDOH) Interventions SDOH Screenings   Food Insecurity: Patient Unable To Answer (05/20/2024)  Housing: Patient Unable To Answer (05/20/2024)  Transportation Needs: Patient Unable To Answer (03/25/2024)  Utilities: Patient Unable To Answer (03/25/2024)  Depression (PHQ2-9): Low Risk  (05/22/2019)  Social Connections: Unknown (03/25/2024)  Tobacco Use: Medium Risk (06/19/2024)    Readmission Risk Interventions    06/15/2024    9:21 AM 05/31/2024   12:39 PM 05/21/2024    1:03 PM  Readmission Risk Prevention Plan  Transportation Screening Complete Complete Complete  Medication Review Oceanographer) Complete Complete Complete  PCP or Specialist appointment within 3-5 days of discharge Complete Complete Complete  HRI or Home Care Consult Complete Complete Complete  SW Recovery Care/Counseling Consult Complete Complete Complete   Palliative Care Screening Complete Complete Complete  Skilled Nursing Facility Complete Not Applicable Not Applicable

## 2024-06-22 NOTE — IPAL (Signed)
  Interdisciplinary Goals of Care Family Meeting   Date carried out: 06/22/2024  Location of the meeting: Phone conference  Member's involved: Physician, Palliative care team member, and Other: Kathleen Jensen (DSS Legal Guardian)  Durable Power of Attorney or acting medical decision maker: Kathleen Jensen, DSS Legal Guardian     Discussion: We discussed goals of care for Kathleen Jensen; Kathleen expressed that she had opportunity to speak with patient's daughters who wished for their mother's comfort. Kathleen also expressed that Code Status be changed to DNR/DNI in the event of arrest. We will reach out to our SW team to see if patient can return to [facility] with hospice care services.   We will transition to Comfort Measures.   Code status:   Code Status: Do not attempt resuscitation (DNR) - Comfort care   Disposition: [name]   Time spent for the meeting: 20 minutes     Kathleen Schlechter, DO  06/22/2024, 3:26 PM

## 2024-06-22 NOTE — NC FL2 (Signed)
 [place]  MEDICAID FL2 LEVEL OF CARE FORM     IDENTIFICATION  Patient Name: Shannon Balthazar Birthdate: [birth year] Sex: female Admission Date (Current Location): 06/19/2024  [facility] and [place] Number:  Producer, Television/film/video and Address:  [facility] [place]. [facility], [address], [place], [place] [address]      Provider Number: [id number]  Attending Physician Name and Address:  Meade Verdon RAMAN, MD  Relative Name and Phone Number:  [name] (Legal Guardian) [phone] (Mobile    Current Level of Care: Hospital Recommended Level of Care: Nursing Facility, Other (Comment) (Hospice) Prior Approval Number:    Date Approved/Denied:   PASRR Number: [id number]  Discharge Plan: SNF    Current Diagnoses: Patient Active Problem List   Diagnosis Date Noted   Shock (HCC) 06/20/2024   Palliative care encounter 05/21/2024   Goals of care, counseling/discussion 05/21/2024   Counseling and coordination of care 05/21/2024   Need for emotional support 05/21/2024   Bedbound 05/21/2024   Complication of feeding tube (HCC) 05/21/2024   Sepsis associated hypotension (HCC) 05/20/2024   Elevated lactic acid level 05/19/2024   Hypoglycemia 04/01/2024   Infection and inflammatory reaction due to cardiac device, implant, and graft 04/01/2024   Hypocalcemia 03/31/2024   PEG tube malfunction (HCC) 03/31/2024   Bacteremia 03/25/2024   Chronic health problem 03/25/2024   Ileus (HCC) 06/06/2023   Acute cystitis 03/01/2023   Delirium secondary to UTI 03/01/2023   Atrial fibrillation, chronic (HCC) 03/01/2023   History of seizure 03/01/2023   Chronic hyponatremia 03/01/2023   Acute respiratory failure (HCC) 11/16/2022   Abdominal distension 11/16/2022   AMS (altered mental status) 05/17/2022   Altered mental status 05/15/2022   Sacral wound 05/15/2022   Intracranial hemorrhage (HCC)    Pressure injury of skin 02/18/2022   Chronic indwelling Foley catheter  02/18/2022   PAF (paroxysmal atrial fibrillation) (HCC) 02/18/2022   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HCC) 02/18/2022   Septic shock (HCC) 02/15/2022   Constipation    Abdominal pain    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   History of right hemiplegia (HCC) 04/12/2020   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   Hyperlipidemia associated with type 2 diabetes mellitus (HCC) 07/30/2018   History of CVA (cerebrovascular accident) 07/30/2018   Vitamin B 12 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/30/2018   Asthma 03/13/2018   Seizures (HCC) 01/12/2018   CVA (cerebral vascular accident) (HCC) 12/08/2017   Abnormal thyroid  blood test 12/08/2017   Pulmonary embolism and infarction (HCC) 12/08/2017   Chronic anemia 12/08/2017   History of expressive aphasia 10/15/2017   Seizure disorder (HCC) 10/15/2017   Dysphagia 10/15/2017   Hyperlipidemia 09/22/2017   Urinary frequency 09/22/2017   Preoperative clearance 11/19/2016   Airway hyperreactivity 01/26/2016   Disease of thyroid  gland 01/26/2016   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   Other specified postprocedural states 01/26/2016   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   Chronic low back pain 04/10/2015   Degeneration of intervertebral disc of lumbar region 04/10/2015   Spondylolisthesis of lumbar region 04/10/2015   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   History of cardiac pacemaker in situ 01/09/2015   B12 DEFICIENCY 08/15/2008   Vitamin D  deficiency 08/15/2008   Sick sinus syndrome (HCC) 08/15/2008   OBSTRUCTIVE SLEEP APNEA 08/11/2008   INSOMNIA 04/28/2008   Hyperlipidemia LDL goal <70 12/12/2006   RHINITIS, ALLERGIC NEC 12/12/2006   Hypothyroidism 12/11/2006   Essential hypertension 12/11/2006   GERD 12/11/2006   STRESS INCONTINENCE 12/11/2006  Orientation RESPIRATION BLADDER Height & Weight        O2 (Nasal Cannula 3L) Incontinent, Indwelling catheter Weight: 236 lb 15.9 oz (107.5 kg) Height:  5'  10 (177.8 cm)  BEHAVIORAL SYMPTOMS/MOOD NEUROLOGICAL BOWEL NUTRITION STATUS    Convulsions/Seizures (Seizure Disorder) Incontinent Diet, Feeding tube (Please see discharge summary; PEG)  AMBULATORY STATUS COMMUNICATION OF NEEDS Skin     Non-Verbally PU Stage and Appropriate Care, Other (Comment) (Pressure Injury Coccyx Mid Unstageable; Wound Breast Left;Lower; Wound Abdomen)                       Personal Care Assistance Level of Assistance              Functional Limitations Info  Speech     Speech Info: Impaired ([place])    SPECIAL CARE FACTORS FREQUENCY                       Contractures Contractures Info: Not present    Additional Factors Info  Code Status, Allergies, Insulin  Sliding Scale Code Status Info: DNR- Comfort Care Allergies Info: Tape, Demerol (Meperidine Hcl), Sulfonamide Derivatives, Bacitracin-polymyxin B, Oxycontin  (Oxycodone )   Insulin  Sliding Scale Info: Please see discharge summary       Current Medications (06/22/2024):  This is the current hospital active medication list Current Facility-Administered Medications  Medication Dose Route Frequency Provider Last Rate Last Admin   acetaminophen  (TYLENOL ) tablet 650 mg  650 mg Oral Q6H PRN Claudene Jeoffrey HERO, NP       Or   acetaminophen  (TYLENOL ) suppository 650 mg  650 mg Rectal Q6H PRN Claudene Jeoffrey HERO, NP       amiodarone  (PACERONE ) tablet 200 mg  200 mg Per Tube Daily Stretch, Lamar PARAS, MD   200 mg at 06/22/24 9071   antiseptic oral rinse (BIOTENE) solution 15 mL  15 mL Topical BID Claudene Jeoffrey HERO, NP       artificial tears ophthalmic solution 1 drop  1 drop Both Eyes QID PRN Claudene Jeoffrey HERO, NP       cyclobenzaprine  (FLEXERIL ) tablet 2.5 mg  2.5 mg Per Tube BID Stretch, Robert J, MD   2.5 mg at 06/22/24 9071   diphenhydrAMINE  (BENADRYL ) injection 25 mg  25 mg Intravenous Q4H PRN Claudene Jeoffrey HERO, NP       glycopyrrolate  (ROBINUL ) tablet 1 mg  1 mg Per Tube Q4H PRN Claudene Jeoffrey HERO, NP       Or    glycopyrrolate  (ROBINUL ) injection 0.2 mg  0.2 mg Subcutaneous Q4H PRN Smith, Amber M, NP       Or   glycopyrrolate  (ROBINUL ) injection 0.2 mg  0.2 mg Intravenous Q4H PRN Claudene Jeoffrey HERO, NP       haloperidol  (HALDOL ) tablet 2 mg  2 mg Per Tube Q6H PRN Claudene Jeoffrey HERO, NP       Or   haloperidol  (HALDOL ) 2 MG/ML solution 2 mg  2 mg Sublingual Q6H PRN Claudene Jeoffrey HERO, NP       Or   haloperidol  lactate (HALDOL ) injection 2 mg  2 mg Intravenous Q6H PRN Claudene Jeoffrey HERO, NP       levETIRAcetam  (KEPPRA ) 100 MG/ML solution 1,000 mg  1,000 mg Per Tube BID Stretch, Robert J, MD   1,000 mg at 06/22/24 9072   LORazepam  (ATIVAN ) tablet 1 mg  1 mg Per Tube Q1H PRN Claudene Jeoffrey HERO, NP       Or  LORazepam  (ATIVAN ) 2 MG/ML concentrated solution 1 mg  1 mg Sublingual Q1H PRN Claudene Jeoffrey HERO, NP       Or   LORazepam  (ATIVAN ) injection 1 mg  1 mg Intravenous Q1H PRN Smith, Amber M, NP       morphine  CONCENTRATE 10 mg / 0.5 ml oral solution 5-10 mg  5-10 mg Per Tube Q2H PRN Claudene Jeoffrey HERO, NP       ondansetron  (ZOFRAN ) injection 4 mg  4 mg Intravenous Q6H PRN Smith, Amber M, NP       polyethylene glycol (MIRALAX  / GLYCOLAX ) packet 17 g  17 g Oral Daily PRN Layman Raisin, DO       valproic  acid (DEPAKENE ) 250 MG/5ML solution 500 mg  500 mg Per Tube TID BM Stretch, Lamar PARAS, MD   500 mg at 06/22/24 1355   And   valproic  acid (DEPAKENE ) 250 MG/5ML solution 750 mg  750 mg Per Tube QHS Stretch, Robert J, MD   750 mg at 06/21/24 2207     Discharge Medications: Please see discharge summary for a list of discharge medications.  Relevant Imaging Results:  Relevant Lab Results:   Additional Information SSN: [id number]. [facility]  Aianna Fahs E Myrl Lazarus, LCSWA

## 2024-06-22 NOTE — Progress Notes (Signed)
 NAME:  Kathleen Jensen, MRN:  [id number], DOB:  02-Aug-1950, LOS: 2 ADMISSION DATE:  06/19/2024, CONSULTATION DATE:  06/20/2024 REFERRING MD:  EDP, CHIEF COMPLAINT:  Sepsis    History of Present Illness:  This is a 73 yo F, chronically bedbound, prior cva (aphasic,  right sided hemiparesis) with a chronic PEG tube for nutrition, chronic urinary retention with indwelling Foley catheter, seizure disorder, advanced dementia, stage IV sacral pressure ulcer, with multiple prior hospitalizations consisted of MRSA bacteremia, endocarditis 02/2024; Pseudomonas UTI 10/22; most recent hospitalization with pseudomonas bacteremia discharged on 11/18 after finishing 10 day course of IV abx.  Pt was transported back to her SNF without issue but re-presents shortly after on 11/22 with fever and tachycardia. EMS reports concern for infection at peg site or ppm leads (which is known and being chronically treated).    Pt offers no complaints and ros is unobtainable 2/2 pt's non verbal baseline  Pertinent  Medical History  H/o cva Sz d/o Presumed endocarditis, fibrinous strands on RA and RV leads Chronic macrocytic anemia Chronic thrombocytopenia Non verbal Chronic hypoxic resp failure on 3L [place] Chronic endocarditis (ppm infected) Chronic dysphagia with peg tube placement  Significant Hospital Events: Including procedures, antibiotic start and stop dates in addition to other pertinent events   11/23 Hypotensive despite fluid resuscitation requiring pressors support > admitted to ICU. MRSA nare positive  11/24 Blood cultures 11/22 > pseudomonas + staph epidermis, On cefepime  and Vanc   Interim History / Subjective:  O/N: fever 100.7   Moaning, awake, does not follow commands  Objective    Blood pressure 116/67, pulse 65, temperature 98.2 F (36.8 C), temperature source Axillary, resp. rate (!) 21, height 5' 10 (1.778 m), weight 107.5 kg, SpO2 99%.        Intake/Output Summary (Last 24  hours) at 06/22/2024 1041 Last data filed at 06/22/2024 0900 Gross per 24 hour  Intake 1775.35 ml  Output 490 ml  Net 1285.35 ml   Filed Weights   06/20/24 0500 06/21/24 0500 06/22/24 0714  Weight: 107.9 kg 107.6 kg 107.5 kg    Examination: General: appears chronically ill  HENT: MM  Lungs: clear Cardiovascular: RR Abdomen: PEG tube with yellowish discharge, no fluctuation noted  Extremities: warm to touch, no LE edema bilaterally  Neuro: Awake, moaning and groaning, minimally following commands  GU: Foley Cath   Labs: Na 137, K 3.8, Scr 0.91 (1.08) Mag 2.8, Phos 1.4  WBC 20.3 (22.4), Hgb 8.5 (9.3), PLT 117 (129) Valproic  acid 22 (low)  Lactic acid 2.9 < 5.7  CBG 204   Resolved problem list  AKI   Assessment and Plan   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   Blood cultures 11/22 > Pseudomonas and Staph epidermidis MRSA nare positive RSV and UA negative  - Continue Cefepime  11/22 -  - Continue IV Vanc for MRSA  - Avoid meropenem  with history of seizure and currently on AED medications   Shock 2/2 sepsis likely from infect AICD leads or PEG site (chronic) vs chronically colonized  Lactic Acidosis resolving  Lactic acid 2.9 < 5.7  Weaning off Levophed  as patient is able to maintain MAP > 65    Chronic PEG tube (jejunostomy catheter exchanged on 11/17 by IR)  Malnutrition  CT a/p wo obstruction;  no fluctuation or discharge noted at the PEG site On Tube feeds    CVA 2018 right-sided deficit chronic indwelling PEG tube/Foley catheter - Continue aspirin  and statin   History of seizure disorder - Continue Keppra  1000 mg BID and Valproic  acid 750 at bedtime  - If patient has a seizure, load with valproic  acid  - Cautious with  seizure lowering medications     Paroxysmal A-fib, rate controlled  - Continue Amiodarone  200 daily - Continue Midodrine  20 TID   History of endocarditis/ MRSA bacteremia  - In the setting of AICD, recently discharged on lifelong cefadroxil .   Chronic macrocytic anemia - Hgb stable  - Transfuse if Hgb < 7   Thrombocytopenia, chronic PLT stable, no need for transfusion   Diabetes mellitus type 2  CBG goal 140-180 with SSI    DVT: Heparin   Prognosis: Phone call meeting at 14:30 with Legal Sherlynn Iha about care plan     Labs   CBC: Recent Labs  Lab 06/19/24 2052 06/20/24 0303 06/21/24 0545 06/22/24 0454  WBC 12.0* 20.7* 22.4* 20.3*  NEUTROABS 9.1*  --   --   --   HGB 10.7* 8.3* 9.3* 8.5*  HCT 34.6* 26.5* 29.8* 26.9*  MCV 109.8* 110.4* 107.2* 106.3*  PLT 139* 120* 129* 117*    Basic Metabolic Panel: Recent Labs  Lab 06/19/24 2052 06/20/24 0303 06/21/24 0545 06/21/24 1927 06/22/24 0454  NA 132* 132* 138  --  137  K 3.9 3.4* 3.7  --  3.8  CL 96* 98 104  --  106  CO2 22 20* 22  --  23  GLUCOSE 102* 86 78  --  208*  BUN 26* 27* 20  --  22  CREATININE 1.38* 1.36* 1.08*  --  0.91  CALCIUM  7.7* 7.2* 7.7*  --  7.4*  MG  --  1.6* 2.7*  --  2.8*  PHOS  --   --   --  2.4* 1.4*   GFR: Estimated Creatinine Clearance: 73.1 mL/min (by C-G formula based on SCr of 0.91 mg/dL). Recent Labs  Lab 06/19/24 2052 06/19/24 2058 06/20/24 0303 06/20/24 0439 06/20/24 0752 06/21/24 0545 06/21/24 0830 06/21/24 2229 06/22/24 0454  WBC 12.0*  --  20.7*  --   --  22.4*  --   --  20.3*  LATICACIDVEN  --    < >  --    < > 5.5*  --  6.0* 5.7* 2.9*   < > = values in this interval not displayed.    Liver Function Tests: Recent Labs  Lab 06/19/24 2052  AST 49*  ALT 13  ALKPHOS 223*  BILITOT 0.9  PROT 9.8*  ALBUMIN  <1.5*   No results for input(s): LIPASE, AMYLASE in the last 168 hours. No results for input(s): AMMONIA in the last 168 hours.  ABG     Component Value Date/Time   PHART 7.414 11/16/2022 1418   PCO2ART 56.4 (H) 11/16/2022 1418   PO2ART 335 (H) 11/16/2022 1418   HCO3 30.1 (H) 03/09/2023 2335   TCO2 28 05/19/2024 1455   ACIDBASEDEF 3.9 (H) 02/15/2022 2230   O2SAT 98 03/09/2023 2335     Coagulation Profile: Recent Labs  Lab 06/19/24 2052  INR 1.5*    Cardiac Enzymes: No results for input(s): CKTOTAL, CKMB, CKMBINDEX, TROPONINI in the last 168 hours.  HbA1C: Hgb A1c MFr Bld  Date/Time Value Ref Range Status  05/29/2024 03:35 AM 5.7 (H) 4.8 - 5.6 % Final    Comment:    (  NOTE) Diagnosis of Diabetes The following HbA1c ranges recommended by the American Diabetes Association (ADA) may be used as an aid in the diagnosis of diabetes mellitus.  Hemoglobin             Suggested A1C NGSP%              Diagnosis  <5.7                   Non Diabetic  5.7-6.4                Pre-Diabetic  >6.4                   Diabetic  <7.0                   Glycemic control for                       adults with diabetes.    11/26/2022 10:43 AM 5.4 4.8 - 5.6 % Final    Comment:    (NOTE) Pre diabetes:          5.7%-6.4%  Diabetes:              >6.4%  Glycemic control for   <7.0% adults with diabetes     CBG: Recent Labs  Lab 06/21/24 1524 06/21/24 1929 06/21/24 2329 06/22/24 0336 06/22/24 0734  GLUCAP 165* 128* 171* 204* 190*    Review of Systems:   As above   Past Medical History:  She,  has a past medical history of Aphasia, Arthritis, Asthma, Cardiac pacemaker, Cerebral amyloid angiopathy (CODE), CKD (chronic kidney disease), Cognitive communication deficit, Diabetes mellitus without complication (HCC), GERD (gastroesophageal reflux disease), 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 (HCC), Hyperlipemia, Hypertension, Insomnia, Intracerebral hemorrhage, intraventricular (HCC), Muscle weakness, Other abnormalities of gait and mobility,  Seizures (HCC), Stroke (HCC), Unsteadiness on feet, and Vitamin B deficiency.   Surgical History:   Past Surgical History:  Procedure Laterality Date   BACK SURGERY     Dec 18, 2016 Dr.Torrealba   BREAST BIOPSY Right    BREAST EXCISIONAL BIOPSY     CERVICAL SPINE SURGERY     C4   CESAREAN SECTION     GASTRIC BYPASS  03/2007   IR GJ TUBE CHANGE  11/16/2023   IR REPLC DUODEN/JEJUNO TUBE PERCUT W/FLUORO  01/27/2023   IR REPLC DUODEN/JEJUNO TUBE PERCUT W/FLUORO  03/07/2023   IR REPLC DUODEN/JEJUNO TUBE PERCUT W/FLUORO  04/30/2023   IR REPLC DUODEN/JEJUNO TUBE PERCUT W/FLUORO  09/02/2023   IR REPLC DUODEN/JEJUNO TUBE PERCUT W/FLUORO  09/24/2023   IR REPLC DUODEN/JEJUNO TUBE PERCUT W/FLUORO  04/02/2024   IR REPLC DUODEN/JEJUNO TUBE PERCUT W/FLUORO  06/14/2024   TONSILLECTOMY     TRANSESOPHAGEAL ECHOCARDIOGRAM (CATH LAB) N/A 04/01/2024   Procedure: TRANSESOPHAGEAL ECHOCARDIOGRAM;  Surgeon: Lonni Slain, MD;  Location: [facility];  Service: Cardiovascular;  Laterality: N/A;     Social History:   reports that she quit smoking about 44 years ago. She started smoking about 48 years ago. She has never used smokeless tobacco. She reports that she does not drink alcohol  and does not use drugs.   Family History:  Her family history includes Cancer in her brother and sister; Diabetes in her father and mother; High blood pressure in her mother; Stroke in her father.   Allergies Allergies  Allergen Reactions   Tape Rash   Demerol [  Meperidine Hcl] Nausea And Vomiting   Sulfonamide Derivatives Itching   Bacitracin-Polymyxin B Dermatitis    Not listed on the MAR   Oxycontin  [Oxycodone ] Itching     Home Medications  Prior to Admission medications   Medication Sig Start Date End Date Taking? Authorizing Provider  acetaminophen  (TYLENOL ) 160 MG/5ML solution Place 20 mLs (640 mg total) into feeding tube every 8 (eight) hours. 12/03/22  Yes Elgergawy, Brayton RAMAN, MD  acetic acid  0.25  % irrigation 20cc irrigation twice a day, flush foley to prevent clogging 11/06/23  Yes [provider]  amiodarone  (PACERONE ) 200 MG tablet Place 1 tablet (200 mg total) into feeding tube daily. 12/03/22  Yes Elgergawy, Brayton RAMAN, MD  ascorbic acid  (VITAMIN C ) 500 MG tablet Place 0.5 tablets (250 mg total) into feeding tube 2 (two) times daily. 06/09/23  Yes Rai, Ripudeep K, MD  aspirin  81 MG chewable tablet Place 81 mg into feeding tube at bedtime.   Yes [provider]  cefadroxil  (DURICEF) 500 MG/5ML suspension Take 10 mLs (1,000 mg total) by mouth every 12 (twelve) hours. Patient taking differently: Place 1,000 mg into feeding tube every 12 (twelve) hours. 05/07/24 11/03/24 Yes Vu, Constance DASEN, MD  chlorhexidine  (PERIDEX ) 0.12 % solution Dab a toothbrush into 15 mLs and brush teeth 2 times a day   Yes [provider]  Cranberry 425 MG CAPS Give 425 mg by tube in the morning and at bedtime.   Yes [provider]  cyclobenzaprine  (FLEXERIL ) 5 MG tablet Place 2.5 mg into feeding tube in the morning and at bedtime. Hold If Sedated   Yes [provider]  docusate sodium  (COLACE) 100 MG capsule Give 1 capsule per tube twice a day   Yes [provider]  estradiol  (ESTRACE ) 0.1 MG/GM vaginal cream Place vaginally See admin instructions. Apply a pea-sized amount to the urethra/vaginal wall on Tuesdays and Fridays   Yes [provider]  famotidine  (PEPCID ) 40 MG/5ML suspension Place 40 mg into feeding tube daily. 03/25/22  Yes [provider]  folic acid  (FOLVITE ) 800 MCG tablet Place 800 mcg into feeding tube daily.   Yes [provider]  HYDROcodone -acetaminophen  (NORCO/VICODIN) 5-325 MG tablet Take 1 tablet by mouth 2 (two) times daily as needed (for pain). Patient taking differently: Place 1 tablet into feeding tube 2 (two) times daily as needed (for pain). 06/15/24  Yes Elgergawy, Brayton RAMAN, MD  levETIRAcetam  (KEPPRA ) 100 MG/ML  solution Place 1,000 mg into feeding tube 2 (two) times daily.   Yes [provider]  liver oil-zinc  oxide (DESITIN) 40 % ointment Apply topically 2 (two) times daily. Apply Desitin around G tube site BID and change split thickness gauze each time 05/31/24  Yes Cheryle Page, MD  metoprolol  tartrate (LOPRESSOR ) 25 MG tablet Place 1 tablet (25 mg total) into feeding tube 2 (two) times daily. 12/03/22  Yes Elgergawy, Brayton RAMAN, MD  Multiple Vitamin (MULTIVITAMIN WITH MINERALS) TABS tablet Place 1 tablet into feeding tube daily. 04/06/24  Yes Mabe, Elna, MD  Nutritional Supplements (FEEDING SUPPLEMENT, OSMOLITE 1.2 CAL,) LIQD Place 1,000 mLs into feeding tube continuous. 05/31/24  Yes Cheryle Page, MD  nystatin  ointment (MYCOSTATIN ) Apply 1 Application topically 3 (three) times daily. 04/26/24  Yes [provider]  OXYGEN Inhale 1-5 L into the lungs in the morning and at bedtime. To keep sats >92%   Yes [provider]  polyethylene glycol (MIRALAX  / GLYCOLAX ) 17 g packet Place 34 g into feeding tube daily.  06/09/23  Yes Rai, Ripudeep K, MD  Protein (FEEDING SUPPLEMENT, PROSOURCE TF20,) liquid Place 60 mLs into feeding tube daily. Patient taking differently: Place 45 mLs into feeding tube 3 (three) times daily. 06/01/24  Yes Cheryle Page, MD  simethicone  (MYLICON) 80 MG chewable tablet Place 80 mg into feeding tube 3 (three) times daily.   Yes [provider]  simvastatin  (ZOCOR ) 10 MG tablet Place 1 tablet (10 mg total) into feeding tube at bedtime. 02/22/22  Yes Cheryle Page, MD  torsemide  (DEMADEX ) 10 MG tablet Place 10 mg into feeding tube every 8 (eight) hours. Give 10 mg per tube every 12 hours for edema, hold for a systolic reading less than 110   Yes [provider]  valproic  acid (DEPAKENE ) 250 MG/5ML solution Place 10-15 mLs into feeding tube See admin instructions. 10 ml via tube three times a day and 15 ml at bedtime   Yes [provider]   Water  For Irrigation, Sterile (FREE WATER ) SOLN Place 200 mLs into feeding tube every 4 (four) hours. Patient taking differently: Place 150 mLs into feeding tube every 4 (four) hours. 05/31/24  Yes Alekh, Kshitiz, MD  diazePAM , 20 MG Dose, (VALTOCO  20 MG DOSE) 2 x 10 MG/0.1ML LQPK Place 20 mg into the nose daily as needed (seizures). Patient not taking: Reported on 06/20/2024    [provider]     Critical care time:

## 2024-06-22 NOTE — Consult Note (Cosign Needed)
 Consultation Note Date: 06/22/2024   Patient Name: Kathleen Jensen  DOB: May 11, 1951  MRN: [id number]  Age / Sex: 73 y.o., female  PCP: [facility] [place] Referring Physician: Meade Verdon RAMAN, MD  Reason for Consultation: Establishing goals of care, home hospice vs inpatient  HPI/Patient Profile: 73 y.o. female  with past medical history of  CVA now bedbound with multiple contractures and with chronic PEG tube, chronic indwelling Foley, seizure disorder, PPM, CKD, advanced dementia, stage IV sacral pressure ulcer, aphasia, and MRSE endocarditis for which she received 6 weeks of IV cefazolin  and is now on lifetime suppression with cefadroxil   was admitted on 06/19/2024 from [facility] with hypotension secondary to sepsis with multiple possible sources sights including chronic Foley, ICD, PEG tube.  Of note, patient has had 4 admissions and 2 ED visits in the last 6 months.  She is a 7-day readmission.  APS is her legal guardian.  PMT has been involved extensively during multiple of patient's previous hospitalizations.  Clinical Assessment and Goals of Care: I have reviewed medical records including:  EPIC notes: CCM, nursing, TOC, ED provider, dietitian  Labs: Creatinine of 0.91 assessed for opioid prescribing; elevated WBC of 20.3 indicating ongoing infection; albumin  of less than 1.5 assessed for overall health, nutritional status, and disease severity, helping predict prognosis and guide care. Available advanced directives in ACP: Outdated MOST form, previous goals of care discussion from 2022, and noted DSS is patient's legal guardian.  Went to visit patient at bedside-no family/visitors present.  Patient was lying in bed-I did not attempt to wake her to preserve comfort.  She appears ill. Signs and non-verbal gestures of discomfort noted. No respiratory distress, increased work of breathing, or  secretions noted.  She is on 3 L O2 nasal cannula.  Discussed case with Dr.  Heddy.   --------------------------------------------------------------------------------------------  Advance Care Planning Conversation   Pertinent diagnoses: Recurrent hospitalizations for bacteremia, sepsis, AKI, advanced dementia, bedbound  The patient and/or family consented to a voluntary Advance Care Planning conversation via phone. Individuals present for the conversation:  DSS legal guardian/Kimberly Joesph, Dr.  Heddy  Summary of the conversation:   2:00 PM Called DSS legal guardian/Kimberly  to discuss diagnosis, prognosis, GOC, EOL wishes, disposition, and options.  I re-introduced Palliative Medicine - [name] is familiar with PMT from patient's recent admission.  [name] has a clear understanding of patient's current acute medical situation from discussions with CCM yesterday - she indicates she is had the opportunity to speak with patient's 3 daughters.  The decision was made to transition patient to hospice care.  [name] indicates that 1 daughter wanted to take patient home with hospice; however, family would not be able to support patient's total care needs in the home setting.  Reviewed options to include inpatient hospice facility versus returning to [facility] LTC with hospice, both in context of patient's current insurance to the best of my ability.  [name] indicates that primary choice would be for patient's return to her LTC facility with hospice and if this is not feasible is okay with inpatient hospice facility.  We talked about transition to comfort measures in house and what that would entail inclusive of medications to control pain, dyspnea, agitation, nausea, and itching. We discussed stopping all unnecessary measures aimed at prolonging life without offering comfort or quality, including antibiotics. All care would focus on how the patient is looking and feeling.  [name] opts for patient's transition to full comfort care in house today.  Concepts  relating to code status discussed - patient currently DNR-Intervene and [name] agrees with DNR/DNI to allow patient a peaceful, natural death.   [name] would appreciate continued updates - PMT will call her with updates tomorrow. [name] will provide updates to patient's family regarding discussions and decisions today.  Outcome of the conversation: -full comfort today -now DNR/DNI -discharge back to LTC with hospice   I spent 25 minutes providing separately identifiable ACP services with the patient and/or surrogate decision maker in a voluntary, in-person conversation discussing the patient's wishes and goals as detailed in the above note.   Primary Decision Maker: LEGAL GUARDIAN - DSS/Kimberly Joesph    SUMMARY OF RECOMMENDATIONS   Initiated full comfort measures Now DNR/DNI Goal is for patient's discharge back to [facility] LTC with hospice - [facility] consult placed; TOC and hospice liaison notified Added orders for EOL symptom management and to reflect full comfort measures, as well as discontinued orders that were not focused on comfort Unrestricted visitation orders were placed per current [facility] EOL visitation policy  Nursing to provide frequent assessments and administer PRN medications as clinically necessary to ensure EOL comfort PMT will continue to follow and support holistically  Symptom Management Morphine  concentrate PRN pain/dyspnea/increased work of breathing/RR>25 Tylenol  PRN pain/fever Biotin twice daily Benadryl  PRN itching Robinul  PRN secretions Haldol  PRN agitation/delirium Ativan  PRN anxiety/seizure/sleep/distress Zofran  PRN nausea/vomiting Liquifilm Tears PRN dry eye Continue amiodarone , Flexeril , Keppra , and valproic  acid for comfort    Code Status/Advance Care Planning: DNR  Palliative Prophylaxis:  Aspiration, Frequent Pain Assessment, Oral Care,  Palliative Wound Care, and Turn Reposition  Additional Recommendations (Limitations, Scope, Preferences): Full Comfort Care  Psycho-social/Spiritual:  Desire for further Chaplaincy support:no Created space and opportunity for patient and legal guardian to express thoughts and feelings regarding patient's current medical situation.  Emotional support and therapeutic listening provided.  Prognosis:  < 2 weeks  Discharge Planning: Skilled Nursing Facility with Hospice      Primary Diagnoses: Present on Admission:  [facility])   I have reviewed the medical record, interviewed the patient and family, and examined the patient. The following aspects are pertinent.  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)    Hyperlipemia    Hypertension    Insomnia    Intracerebral hemorrhage, intraventricular (HCC)    Muscle weakness    Other abnormalities of gait and mobility    Seizures (HCC)    Stroke (HCC)    Unsteadiness on feet    Vitamin B deficiency    Social History   Socioeconomic History   Marital status: Divorced    Spouse name: Not on file   Number of children: 3   Years of education: 82yrs   Highest education level: Not on file  Occupational History   Occupation: Retired    Associate Professor: [employer]    Comment: social services  Tobacco Use   Smoking status: Former    Current packs/day: 0.00    Types: Cigarettes    Start date: 07/30/1975    Quit date: 07/30/1979    Years since quitting: 44.9  Smokeless tobacco: Never  Vaping Use   Vaping status: Never Used  Substance and Sexual  Activity   Alcohol  use: No    Alcohol /week: 0.0 standard drinks of alcohol    Drug use: No   Sexual activity: Never    Partners: Male  Other Topics Concern   Not on file  Social History Narrative   Patient lives at home alone.   Caffeine Use: 1 cup daily   1 dog   Social Drivers of Corporate Investment Banker Strain: Not on file  Food Insecurity: Patient Unable To Answer (05/20/2024)   Hunger Vital Sign    Worried About Running Out of Food in the Last Year: Patient unable to answer    Ran Out of Food in the Last Year: Patient unable to answer  Transportation Needs: Patient Unable To Answer (03/25/2024)   PRAPARE - Transportation    Lack of Transportation (Medical): Patient unable to answer    Lack of Transportation (Non-Medical): Patient unable to answer  Physical Activity: Not on file  Stress: Not on file  Social Connections: Unknown (03/25/2024)   Social Connection and Isolation Panel    Frequency of Communication with Friends and Family: Patient unable to answer    Frequency of Social Gatherings with Friends and Family: Patient unable to answer    Attends Religious Services: Patient unable to answer    Active Member of Clubs or Organizations: Not on file    Attends Banker Meetings: Patient unable to answer    Marital Status: Patient unable to answer   Family History  Problem Relation Age of Onset   High blood pressure Mother    Diabetes Mother    Diabetes Father    Stroke Father    Cancer Sister        Colorectal cancer   Cancer Brother        colon   Scheduled Meds:  amiodarone   200 mg Per Tube Daily   aspirin   81 mg Per Tube Daily   Chlorhexidine  Gluconate Cloth  6 each Topical Daily   cyclobenzaprine   2.5 mg Per Tube BID   folic acid   1,000 mcg Per Tube QHS   heparin  injection (subcutaneous)  5,000 Units Subcutaneous Q8H   insulin  aspart  0-6 Units Subcutaneous Q4H   levETIRAcetam   1,000 mg Per Tube BID   midodrine   20 mg Per Tube TID WC    multivitamin with minerals  1 tablet Per Tube Daily   mupirocin  ointment  1 Application Nasal BID   simvastatin   10 mg Per Tube QHS   valproic  acid  500 mg Per Tube TID BM   And   valproic  acid  750 mg Per Tube QHS   Continuous Infusions:  ceFEPime  (MAXIPIME ) IV 200 mL/hr at 06/22/24 0600   feeding supplement (OSMOLITE 1.5 CAL) 20 mL/hr at 06/22/24 0600   norepinephrine  (LEVOPHED ) Adult infusion 9 mcg/min (06/22/24 0741)   sodium phosphate  45 mmol in sodium chloride  0.9 % 250 mL infusion 45 mmol (06/22/24 0749)   vancomycin  Stopped (06/21/24 1332)   PRN Meds:.acetaminophen , docusate sodium , polyethylene glycol Medications Prior to Admission:  Prior to Admission medications   Medication Sig Start Date End Date Taking? Authorizing Provider  acetaminophen  (TYLENOL ) 160 MG/5ML solution Place 20 mLs (640 mg total) into feeding tube every 8 (eight) hours. 12/03/22  Yes Elgergawy, Brayton RAMAN, MD  acetic acid  0.25 % irrigation 20cc irrigation twice a day, flush foley to prevent clogging 11/06/23  Yes [provider]  amiodarone  (PACERONE ) 200 MG tablet Place 1 tablet (200 mg total) into feeding tube daily. 12/03/22  Yes Elgergawy, Brayton RAMAN, MD  ascorbic acid  (VITAMIN C ) 500 MG tablet Place 0.5 tablets (250 mg total) into feeding tube 2 (two) times daily. 06/09/23  Yes Rai, Ripudeep K, MD  aspirin  81 MG chewable tablet Place 81 mg into feeding tube at bedtime.   Yes [provider]  cefadroxil  (DURICEF) 500 MG/5ML suspension Take 10 mLs (1,000 mg total) by mouth every 12 (twelve) hours. Patient taking differently: Place 1,000 mg into feeding tube every 12 (twelve) hours. 05/07/24 11/03/24 Yes Vu, Constance DASEN, MD  chlorhexidine  (PERIDEX ) 0.12 % solution Dab a toothbrush into 15 mLs and brush teeth 2 times a day   Yes [provider]  Cranberry 425 MG CAPS Give 425 mg by tube in the morning and at bedtime.   Yes [provider]  cyclobenzaprine  (FLEXERIL ) 5 MG tablet Place 2.5  mg into feeding tube in the morning and at bedtime. Hold If Sedated   Yes [provider]  docusate sodium  (COLACE) 100 MG capsule Give 1 capsule per tube twice a day   Yes [provider]  estradiol  (ESTRACE ) 0.1 MG/GM vaginal cream Place vaginally See admin instructions. Apply a pea-sized amount to the urethra/vaginal wall on Tuesdays and Fridays   Yes [provider]  famotidine  (PEPCID ) 40 MG/5ML suspension Place 40 mg into feeding tube daily. 03/25/22  Yes [provider]  folic acid  (FOLVITE ) 800 MCG tablet Place 800 mcg into feeding tube daily.   Yes [provider]  HYDROcodone -acetaminophen  (NORCO/VICODIN) 5-325 MG tablet Take 1 tablet by mouth 2 (two) times daily as needed (for pain). Patient taking differently: Place 1 tablet into feeding tube 2 (two) times daily as needed (for pain). 06/15/24  Yes Elgergawy, Brayton RAMAN, MD  levETIRAcetam  (KEPPRA ) 100 MG/ML solution Place 1,000 mg into feeding tube 2 (two) times daily.   Yes [provider]  liver oil-zinc  oxide (DESITIN) 40 % ointment Apply topically 2 (two) times daily. Apply Desitin around G tube site BID and change split thickness gauze each time 05/31/24  Yes Cheryle Page, MD  metoprolol  tartrate (LOPRESSOR ) 25 MG tablet Place 1 tablet (25 mg total) into feeding tube 2 (two) times daily. 12/03/22  Yes Elgergawy, Brayton RAMAN, MD  Multiple Vitamin (MULTIVITAMIN WITH MINERALS) TABS tablet Place 1 tablet into feeding tube daily. 04/06/24  Yes Mabe, Elna, MD  Nutritional Supplements (FEEDING SUPPLEMENT, OSMOLITE 1.2 CAL,) LIQD Place 1,000 mLs into feeding tube continuous. 05/31/24  Yes Cheryle Page, MD  nystatin  ointment (MYCOSTATIN ) Apply 1 Application topically 3 (three) times daily. 04/26/24  Yes [provider]  OXYGEN Inhale 1-5 L into the lungs in the morning and at bedtime. To keep sats >92%   Yes [provider]  polyethylene glycol (MIRALAX  / GLYCOLAX ) 17 g packet  Place 34 g into feeding tube daily. 06/09/23  Yes Rai, Ripudeep K, MD  Protein (FEEDING SUPPLEMENT, PROSOURCE TF20,) liquid Place 60 mLs into feeding tube daily. Patient taking differently: Place 45 mLs into feeding tube 3 (three) times daily. 06/01/24  Yes Cheryle Page, MD  simethicone  (MYLICON) 80 MG chewable tablet Place 80 mg into feeding tube 3 (three) times daily.   Yes [provider]  simvastatin  (ZOCOR ) 10 MG tablet Place 1 tablet (10 mg total) into feeding tube at bedtime. 02/22/22  Yes Cheryle Page, MD  torsemide  (DEMADEX ) 10 MG tablet Place 10 mg into feeding  tube every 8 (eight) hours. Give 10 mg per tube every 12 hours for edema, hold for a systolic reading less than 110   Yes [provider]  valproic  acid (DEPAKENE ) 250 MG/5ML solution Place 10-15 mLs into feeding tube See admin instructions. 10 ml via tube three times a day and 15 ml at bedtime   Yes [provider]  Water  For Irrigation, Sterile (FREE WATER ) SOLN Place 200 mLs into feeding tube every 4 (four) hours. Patient taking differently: Place 150 mLs into feeding tube every 4 (four) hours. 05/31/24  Yes Cheryle Page, MD  diazePAM , 20 MG Dose, (VALTOCO  20 MG DOSE) 2 x 10 MG/0.1ML LQPK Place 20 mg into the nose daily as needed (seizures). Patient not taking: Reported on 06/20/2024    [provider]   Allergies  Allergen Reactions   Tape Rash   Demerol [Meperidine Hcl] Nausea And Vomiting   Sulfonamide Derivatives Itching   Bacitracin-Polymyxin B Dermatitis    Not listed on the MAR   Oxycontin  [Oxycodone ] Itching   Review of Systems  Unable to perform ROS: Dementia    Physical Exam Vitals and nursing note reviewed.  Constitutional:      General: She is not in acute distress.    Appearance: She is ill-appearing.  Pulmonary:     Effort: No respiratory distress.  Skin:    General: Skin is warm and dry.  Neurological:     Mental Status: She is oriented to person, place, and  time. She is lethargic.  Psychiatric:        Speech: She is noncommunicative.        Cognition and Memory: Cognition is impaired. Memory is impaired.     Vital Signs: BP 106/61   Pulse 71   Temp 98.2 F (36.8 C) (Axillary)   Resp (!) 23   Ht 5' 10 (1.778 m)   Wt 107.5 kg   SpO2 99%   BMI 34.01 kg/m  Pain Scale: CPOT   Pain Score: 4    SpO2: SpO2: 99 % O2 Device:SpO2: 99 % O2 Flow Rate: .O2 Flow Rate (L/min): 3 L/min  IO: Intake/output summary:  Intake/Output Summary (Last 24 hours) at 06/22/2024 0842 Last data filed at 06/22/2024 0600 Gross per 24 hour  Intake 1663.32 ml  Output 490 ml  Net 1173.32 ml    LBM: Last BM Date : 06/20/24 Baseline Weight: Weight: 100 kg Most recent weight: Weight: 107.5 kg     Palliative Assessment/Data: PPS 10% with stopping tube feeds    Discussed case with: CCM, TOC, hospice liaison, DSS legal guardian, primary RN, hospitalist   Signed by: Jeoffrey CHRISTELLA Sharps, NP   Please contact Palliative Medicine Team phone at [phone] for questions and concerns.  For individual provider: See Amion  *Portions of this note are a verbal dictation therefore any spelling and/or grammatical errors are due to the Dragon Medical One system interpretation.

## 2024-06-23 ENCOUNTER — Inpatient Hospital Stay (HOSPITAL_COMMUNITY)

## 2024-06-23 ENCOUNTER — Ambulatory Visit: Admitting: Internal Medicine

## 2024-06-23 DIAGNOSIS — R579 Shock, unspecified: Secondary | ICD-10-CM | POA: Diagnosis not present

## 2024-06-23 DIAGNOSIS — R609 Edema, unspecified: Secondary | ICD-10-CM

## 2024-06-23 DIAGNOSIS — D696 Thrombocytopenia, unspecified: Secondary | ICD-10-CM | POA: Diagnosis not present

## 2024-06-23 DIAGNOSIS — I639 Cerebral infarction, unspecified: Secondary | ICD-10-CM | POA: Diagnosis not present

## 2024-06-23 DIAGNOSIS — R0989 Other specified symptoms and signs involving the circulatory and respiratory systems: Secondary | ICD-10-CM

## 2024-06-23 DIAGNOSIS — D509 Iron deficiency anemia, unspecified: Secondary | ICD-10-CM | POA: Diagnosis not present

## 2024-06-23 DIAGNOSIS — Z515 Encounter for palliative care: Secondary | ICD-10-CM | POA: Diagnosis not present

## 2024-06-23 DIAGNOSIS — A419 Sepsis, unspecified organism: Secondary | ICD-10-CM | POA: Diagnosis not present

## 2024-06-23 DIAGNOSIS — A4102 Sepsis due to Methicillin resistant Staphylococcus aureus: Secondary | ICD-10-CM | POA: Diagnosis not present

## 2024-06-23 DIAGNOSIS — R0682 Tachypnea, not elsewhere classified: Secondary | ICD-10-CM

## 2024-06-23 DIAGNOSIS — R6521 Severe sepsis with septic shock: Secondary | ICD-10-CM | POA: Diagnosis not present

## 2024-06-23 DIAGNOSIS — Z7189 Other specified counseling: Secondary | ICD-10-CM | POA: Diagnosis not present

## 2024-06-23 MED ORDER — HALOPERIDOL LACTATE 2 MG/ML PO CONC: 2.0000 mg | Freq: Four times a day (QID) | ORAL | Status: AC | PRN

## 2024-06-23 MED ORDER — CYCLOBENZAPRINE HCL 5 MG PO TABS: 2.5000 mg | ORAL_TABLET | Freq: Two times a day (BID) | ORAL | Status: AC

## 2024-06-23 MED ORDER — DIPHENHYDRAMINE HCL 50 MG/ML IJ SOLN: 25.0000 mg | INTRAMUSCULAR | Status: AC | PRN

## 2024-06-23 MED ORDER — POLYETHYLENE GLYCOL 3350 17 G PO PACK: 17.0000 g | PACK | Freq: Every day | ORAL | Status: AC | PRN

## 2024-06-23 MED ORDER — ONDANSETRON HCL 4 MG/2ML IJ SOLN: 4.0000 mg | Freq: Four times a day (QID) | INTRAMUSCULAR | Status: AC | PRN

## 2024-06-23 MED ORDER — HALOPERIDOL LACTATE 5 MG/ML IJ SOLN: 2.0000 mg | Freq: Four times a day (QID) | INTRAMUSCULAR | Status: AC | PRN

## 2024-06-23 MED ORDER — GLYCOPYRROLATE 0.2 MG/ML IJ SOLN: 0.2000 mg | INTRAMUSCULAR | 0 refills | Status: AC | PRN

## 2024-06-23 MED ORDER — HALOPERIDOL 2 MG PO TABS: 2.0000 mg | ORAL_TABLET | Freq: Four times a day (QID) | ORAL | Status: AC | PRN

## 2024-06-23 MED ORDER — LORAZEPAM 2 MG/ML IJ SOLN: 1.0000 mg | INTRAMUSCULAR | 0 refills | Status: AC | PRN

## 2024-06-23 MED ORDER — BIOTENE DRY MOUTH MT LIQD: 15.0000 mL | Freq: Two times a day (BID) | OROMUCOSAL | Status: AC

## 2024-06-23 MED ORDER — POLYVINYL ALCOHOL 1.4 % OP SOLN: 1.0000 [drp] | Freq: Four times a day (QID) | OPHTHALMIC | Status: AC | PRN

## 2024-06-23 MED ORDER — MORPHINE SULFATE (CONCENTRATE) 10 MG /0.5 ML PO SOLN: 5.0000 mg | ORAL | 0 refills | Status: AC | PRN

## 2024-06-23 MED ORDER — VALPROIC ACID 250 MG/5ML PO SOLN: ORAL | Status: AC

## 2024-06-23 MED ORDER — ACETAMINOPHEN 650 MG RE SUPP: 650.0000 mg | Freq: Four times a day (QID) | RECTAL | Status: AC | PRN

## 2024-06-23 MED ORDER — ACETAMINOPHEN 325 MG PO TABS: 650.0000 mg | ORAL_TABLET | Freq: Four times a day (QID) | ORAL | Status: AC | PRN

## 2024-06-23 NOTE — Progress Notes (Signed)
 Called report to [facility] [phone], spoke with nurse Lyle who will be taking care of pt in room 208P when she arrives. Nurse verbalized understanding of report and had no further questions. Pt currently in room waiting for PTAR pick up

## 2024-06-23 NOTE — Plan of Care (Signed)
 ?  Problem: Coping: ?Goal: Level of anxiety will decrease ?Outcome: Progressing ?  ?Problem: Safety: ?Goal: Ability to remain free from injury will improve ?Outcome: Progressing ?  ?

## 2024-06-23 NOTE — Progress Notes (Signed)
 Daily Progress Note   Patient Name: Kathleen Jensen       Date: 06/23/2024 DOB: 03-31-1951  Age: 73 y.o. MRN#: [id number] Attending Physician: Meade Verdon RAMAN, MD Primary Care Physician: Place, Orysia Admit Date: 06/19/2024  Reason for Consultation/Follow-up: Non pain symptom management, Pain control, Psychosocial/spiritual support, and Terminal Care  Subjective: I have reviewed medical records including: EPIC notes: Nursing, dietitian, CCM, [facility] liaison MAR: As needed medications administered in the last 24 hours - none VS - stable for transfer Advanced directives: MOST form found in shadow chart which is more current than scanned form in ACP - copy made  Received report from primary RN -no acute concerns.  Went to visit patient at bedside - no family/visitors present.  Patient was lying in bed -did not attempt to arouse her to preserve comfort. No signs or non-verbal gestures of pain or discomfort noted. No respiratory distress or increased work of breathing; secretions noted. RR 24.   Requested RN administer Robinul  for secretions and morphine  for increased respiratory rate.  9:00 AM Called DSS legal guardian/Kimberly -provided updates that [facility] is able to take patient back with hospice.  She confirms that she was able to speak with [facility] hospice liaison this morning and questions were answered.  She is okay with patient's discharge when appropriate.  Per TOC, [facility] is able to accept patient back with hospice today.   Length of Stay: 3  Current Medications: Scheduled Meds:   amiodarone   200 mg Per Tube Daily   antiseptic oral rinse  15 mL Topical BID   cyclobenzaprine   2.5 mg Per Tube BID   levETIRAcetam   1,000 mg Per Tube BID   valproic   acid  500 mg Per Tube TID BM   And   valproic  acid  750 mg Per Tube QHS    Continuous Infusions:   PRN Meds: acetaminophen  **OR** acetaminophen , artificial tears, diphenhydrAMINE , glycopyrrolate  **OR** glycopyrrolate  **OR** glycopyrrolate , haloperidol  **OR** haloperidol  **OR** haloperidol  lactate, LORazepam  **OR** LORazepam  **OR** LORazepam , morphine  CONCENTRATE, [DISCONTINUED] ondansetron  **OR** ondansetron  (ZOFRAN ) IV, polyethylene glycol  Physical Exam Vitals and nursing note reviewed.  Constitutional:      General: She is not in acute distress.    Appearance: She is ill-appearing.  Pulmonary:     Effort: No respiratory distress.  Skin:    General: Skin  is warm and dry.  Neurological:     Mental Status: She is lethargic.     Motor: Weakness present.             Vital Signs: BP 132/69 (BP Location: Right Leg)   Pulse (!) 108   Temp 98 F (36.7 C) (Oral)   Resp (!) 24   Ht 5' 10 (1.778 m)   Wt 107.5 kg   SpO2 (!) 87%   BMI 34.01 kg/m  SpO2: SpO2: (!) 87 % O2 Device: O2 Device: Nasal Cannula O2 Flow Rate: O2 Flow Rate (L/min): 3 L/min  Intake/output summary:  Intake/Output Summary (Last 24 hours) at 06/23/2024 0823 Last data filed at 06/23/2024 9365 Gross per 24 hour  Intake 911.19 ml  Output 850 ml  Net 61.19 ml   LBM: Last BM Date : 06/22/24 Baseline Weight: Weight: 100 kg Most recent weight: Weight: 107.5 kg       Palliative Assessment/Data: PPS 10% with PEG tube      Patient Active Problem List   Diagnosis Date Noted   Shock (HCC) 06/20/2024   Palliative care encounter 05/21/2024   Goals of care, counseling/discussion 05/21/2024   Counseling and coordination of care 05/21/2024   Need for emotional support 05/21/2024   Bedbound 05/21/2024   Complication of feeding tube (HCC) 05/21/2024   Sepsis associated hypotension (HCC) 05/20/2024   Elevated lactic acid level 05/19/2024   Hypoglycemia 04/01/2024   Infection and inflammatory reaction due to  cardiac device, implant, and graft 04/01/2024   Hypocalcemia 03/31/2024   PEG tube malfunction (HCC) 03/31/2024   Bacteremia 03/25/2024   Chronic health problem 03/25/2024   Ileus (HCC) 06/06/2023   Acute cystitis 03/01/2023   Delirium secondary to UTI 03/01/2023   Atrial fibrillation, chronic (HCC) 03/01/2023   History of seizure 03/01/2023   Chronic hyponatremia 03/01/2023   Acute respiratory failure (HCC) 11/16/2022   Abdominal distension 11/16/2022   AMS (altered mental status) 05/17/2022   Altered mental status 05/15/2022   Sacral wound 05/15/2022   Intracranial hemorrhage (HCC)    Pressure injury of skin 02/18/2022   Chronic indwelling Foley catheter 02/18/2022   PAF (paroxysmal atrial fibrillation) (HCC) 02/18/2022   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HCC) 02/18/2022   Septic shock (HCC) 02/15/2022   Constipation    Abdominal pain    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   History of right hemiplegia (HCC) 04/12/2020   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   Hyperlipidemia associated with type 2 diabetes mellitus (HCC) 07/30/2018   History of CVA (cerebrovascular accident) 07/30/2018   Vitamin B 12 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/30/2018   Asthma 03/13/2018   Seizures (HCC) 01/12/2018   CVA (cerebral vascular accident) (HCC) 12/08/2017   Abnormal thyroid  blood test 12/08/2017   Pulmonary embolism and infarction (HCC) 12/08/2017   Chronic anemia 12/08/2017   History of expressive aphasia 10/15/2017   Seizure disorder (HCC) 10/15/2017   Dysphagia 10/15/2017   Hyperlipidemia 09/22/2017   Urinary frequency 09/22/2017   Preoperative clearance 11/19/2016   Airway hyperreactivity 01/26/2016   Disease of thyroid  gland 01/26/2016   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   Other specified postprocedural states 01/26/2016   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   Chronic low back pain 04/10/2015   Degeneration of intervertebral disc of lumbar region 04/10/2015    Spondylolisthesis of lumbar region 04/10/2015   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   History of cardiac pacemaker in situ 01/09/2015   B12 DEFICIENCY 08/15/2008   Vitamin D  deficiency  08/15/2008   Sick sinus syndrome (HCC) 08/15/2008   OBSTRUCTIVE SLEEP APNEA 08/11/2008   INSOMNIA 04/28/2008   Hyperlipidemia LDL goal <70 12/12/2006   RHINITIS, ALLERGIC NEC 12/12/2006   Hypothyroidism 12/11/2006   Essential hypertension 12/11/2006   GERD 12/11/2006   STRESS INCONTINENCE 12/11/2006    Palliative Care Assessment & Plan   Patient Profile: 73 y.o. 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   Of note, patient has had 4 admissions and 2 ED visits in the last 6 months.  She is a 7-day readmission.  APS is her legal guardian.  PMT has been involved extensively during multiple of patient's previous hospitalizations.  Assessment: Principal Problem:   Shock (HCC)   Terminal care  Recommendations/Plan: Continue full comfort measures Continue DNR/DNI as previously documented - durable DNR form completed and placed in shadow chart. Copy was made and will be scanned into Vynca/ACP tab along copy of updated MOST form Plan for patient's discharge back to [facility] with hospice today Continue current comfort focused medication regimen as noted below - no changes PMT will continue to follow peripherally. If there are any imminent needs please call the service directly  Symptom Management Morphine  concentrate PRN  pain/dyspnea/increased work of breathing/RR>25 Tylenol  PRN pain/fever Biotin twice daily Benadryl  PRN itching Robinul  PRN secretions Haldol  PRN agitation/delirium Ativan  PRN anxiety/seizure/sleep/distress Zofran  PRN nausea/vomiting Liquifilm Tears PRN dry eye Continue amiodarone , Flexeril , Keppra , and valproic  acid for comfort  Goals of Care and Additional Recommendations: Limitations on Scope of Treatment: Full Comfort Care  Code Status:    Code Status Orders  (From admission, onward)           Start     Ordered   06/22/24 1504  Do not attempt resuscitation (DNR) - Comfort care  Continuous       Question Answer Comment  If patient has no pulse and is not breathing Do Not Attempt Resuscitation   In Pre-Arrest Conditions (Patient Is Breathing and Has a Pulse) Provide comfort measures. Relieve any mechanical airway obstruction. Avoid transfer unless required for comfort.   Consent: Discussion documented in EHR or advanced directives reviewed      06/22/24 1508           Code Status History     Date Active Date Inactive Code Status Order ID Comments User Context   06/20/2024 0006 06/22/2024 1508 Do not attempt resuscitation (DNR) PRE-ARREST INTERVENTIONS DESIRED [id number]  Layman Raisin, DO ED   06/15/2024 0920 06/15/2024 2149 Limited: Do not attempt resuscitation (DNR) -DNR-LIMITED -Do Not Intubate/DNI  [id number]  Oneita Rakers, MD Inpatient   06/04/2024 2314 06/15/2024 0919 Full Code [id number]  Arthea Child, MD ED   05/19/2024 2257 05/31/2024 1953 Full Code [id number]  Arthea Child, MD ED   03/25/2024 1310 04/06/2024 2128 Full Code [id number]  Howell Lunger, DO ED   06/06/2023 1800 06/10/2023 0439 Full Code [id number]  Lou Claretta HERO, MD ED   03/01/2023 0229 03/06/2023 1747 Full Code [id number]  Lee Kingfisher, MD ED   11/16/2022 2216 12/03/2022 1824 DNR [id number]  Fredirick Glenys RAMAN, MD  ED   11/16/2022 2042 11/16/2022 2216 DNR [id number]  Yolande Lamar BROCKS, MD ED    11/16/2022 1325 11/16/2022 2042 Full Code [id number]  Yolande Lamar BROCKS, MD ED   05/15/2022 1946 05/18/2022 2321 Full Code [id number]  Howell Lunger, DO ED   02/15/2022 1947 02/22/2022 1859 Full Code [id number]  Shelah Lamar RAMAN, MD ED   05/29/2021 1500 06/07/2021 0431 Full Code [id number]  Waddell Rake, MD ED   04/12/2020 0114 04/14/2020 1847 Full Code [id number]  Lonzell Emeline HERO, DO ED   10/05/2017 1544 10/15/2017 1619 Full Code [id number]  Black, Darice HERO, NP ED       Prognosis:  < 2 weeks  Discharge Planning: Skilled Nursing Facility with Hospice  Care plan was discussed with primary RN, APS legal guardian, TOC, Dr. Meade  Thank you for allowing the Palliative Medicine Team to assist in the care of this patient.     Jeoffrey HERO Sharps, NP  Please contact Palliative Medicine Team phone at [phone] for questions and concerns.   *Portions of this note are a verbal dictation therefore any spelling and/or grammatical errors are due to the Dragon Medical One system interpretation.

## 2024-06-23 NOTE — Care Management Important Message (Signed)
 Important Message  Patient Details  Name: Kathleen Jensen MRN: [id number] Date of Birth: 17-Sep-1950   Important Message Given:  Yes - Medicare IM     [name] 06/23/2024, 12:32 PM

## 2024-06-23 NOTE — Progress Notes (Signed)
 Nutrition Brief Note  Chart reviewed. Pt has transitioned to comfort care. Tube feeds have been discontinued.   No further nutrition interventions planned at this time. Please re-consult as needed.    Nestora Glatter RD, LDN Registered Dietitian I Please see AMION for contact information

## 2024-06-23 NOTE — TOC Transition Note (Addendum)
 Transition of Care [facility]) - Discharge Note   Patient Details  Name: Kathleen Jensen MRN: [id number] Date of Birth: 1951/05/06  Transition of Care [facility] CM/SW Contact:  Jeoffrey LITTIE Maranda ISRAEL Phone Number: 06/23/2024, 11:11 AM   Clinical Narrative:    Patient will DC to: [facility] Anticipated DC date: 06/23/24 Family notified: Yes Transport by: [facility]   Per MD patient ready for DC to [facility]. RN to call report prior to discharge [phone] Room 208P. RN, patient, patient's family, and facility notified of DC. Discharge Summary and FL2 sent to facility. DC packet on chart. Ambulance transport requested for patient.   CSW will sign off for now as social work intervention is no longer needed. Please consult us  again if new needs arise.     Final next level of care: Hospice Medical Facility Barriers to Discharge: Barriers Resolved   Patient Goals and CMS Choice            Discharge Placement   Existing PASRR number confirmed : 06/23/24          Patient chooses bed at: [facility] Patient to be transferred to facility by: [facility] Name of family member notified: [name]- Legal Guardian Patient and family notified of of transfer: 06/23/24  Discharge Plan and Services Additional resources added to the After Visit Summary for   In-house Referral: Clinical Social Work   Post Acute Care Choice: Hospice, Skilled Nursing Facility                               Social Drivers of Health (SDOH) Interventions SDOH Screenings   Food Insecurity: Patient Unable To Answer (06/22/2024)  Housing: Patient Unable To Answer (06/22/2024)  Transportation Needs: Patient Unable To Answer (06/22/2024)  Utilities: Patient Unable To Answer (06/22/2024)  Depression (PHQ2-9): Low Risk  (05/22/2019)  Social Connections: Unknown (06/22/2024)  Tobacco Use: Medium Risk (06/19/2024)     Readmission Risk Interventions    06/15/2024    9:21 AM 05/31/2024   12:39 PM  05/21/2024    1:03 PM  Readmission Risk Prevention Plan  Transportation Screening Complete Complete Complete  Medication Review Oceanographer) Complete Complete Complete  PCP or Specialist appointment within 3-5 days of discharge Complete Complete Complete  HRI or Home Care Consult Complete Complete Complete  SW Recovery Care/Counseling Consult Complete Complete Complete  Palliative Care Screening Complete Complete Complete  Skilled Nursing Facility Complete Not Applicable Not Applicable

## 2024-06-23 NOTE — Progress Notes (Signed)
 NAME:  Kathleen Jensen, MRN:  [id number], DOB:  22-Dec-1950, LOS: 3 ADMISSION DATE:  06/19/2024, CONSULTATION DATE:  06/20/2024 REFERRING MD:  EDP, CHIEF COMPLAINT:  Sepsis    History of Present Illness:  This is a 73 yo F, chronically bedbound, prior cva (aphasic,  right sided hemiparesis) with a chronic PEG tube for nutrition, chronic urinary retention with indwelling Foley catheter, seizure disorder, advanced dementia, stage IV sacral pressure ulcer, with multiple prior hospitalizations consisted of MRSA bacteremia, endocarditis 02/2024; Pseudomonas UTI 10/22; most recent hospitalization with pseudomonas bacteremia discharged on 11/18 after finishing 10 day course of IV abx.  Pt was transported back to her SNF without issue but re-presents shortly after on 11/22 with fever and tachycardia. EMS reports concern for infection at peg site or ppm leads (which is known and being chronically treated).    Pt offers no complaints and ros is unobtainable 2/2 pt's non verbal baseline  Pertinent  Medical History  H/o cva Sz d/o Presumed endocarditis, fibrinous strands on RA and RV leads Chronic macrocytic anemia Chronic thrombocytopenia Non verbal Chronic hypoxic resp failure on 3L [place] Chronic endocarditis (ppm infected) Chronic dysphagia with peg tube placement  Significant Hospital Events: Including procedures, antibiotic start and stop dates in addition to other pertinent events   11/23 Hypotensive despite fluid resuscitation requiring pressors support > admitted to ICU. MRSA nare positive  11/24 Blood cultures 11/22 > pseudomonas + staph epidermis, On cefepime  and Vanc  11/25 Transitioned to Comfort Care  Interim History / Subjective:  O/N:   Objective    Blood pressure 132/69, pulse (!) 108, temperature 98 F (36.7 C), temperature source Oral, resp. rate (!) 24, height 5' 10 (1.778 m), weight 107.5 kg, SpO2 (!) 87%.        Intake/Output Summary (Last 24 hours) at 06/23/2024  0815 Last data filed at 06/23/2024 9365 Gross per 24 hour  Intake 911.19 ml  Output 850 ml  Net 61.19 ml   Filed Weights   06/20/24 0500 06/21/24 0500 06/22/24 0714  Weight: 107.9 kg 107.6 kg 107.5 kg    Examination: General: appears chronically ill  HENT: MM  Lungs: clear Cardiovascular: RR Abdomen: PEG tube with yellowish discharge, no fluctuation noted  Extremities: warm to touch, no LE edema bilaterally  Neuro: Awake, moaning and groaning, minimally following commands  GU: Foley Cath      Resolved problem list  AKI   Assessment and Plan   Comfort Measures:    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   Blood cultures 11/22 > Pseudomonas and Staph epidermidis MRSA nare positive RSV and UA negative  - Continue Cefepime  11/22 -  - Continue IV Vanc for MRSA  - Avoid meropenem  with history of seizure and currently on AED medications   Shock 2/2 sepsis likely from infect AICD leads or PEG site (chronic) vs chronically colonized  Lactic Acidosis resolving  Lactic acid 2.9 < 5.7  Weaning off Levophed  as patient is able to maintain MAP > 65    Chronic PEG tube (jejunostomy catheter exchanged on 11/17 by IR)  Malnutrition  CT a/p wo obstruction; no fluctuation or discharge noted at the PEG site On Tube feeds    CVA 2018 right-sided deficit chronic indwelling PEG tube/Foley catheter - Continue aspirin  and statin  History of seizure disorder - Continue Keppra  1000 mg BID and Valproic  acid 750 at bedtime  - If patient has a seizure, load with valproic  acid  - Cautious with seizure lowering medications     Paroxysmal A-fib, rate controlled  - Continue Amiodarone  200 daily - Continue Midodrine  20 TID   History of endocarditis/  MRSA bacteremia  - In the setting of AICD, recently discharged on lifelong cefadroxil .   Chronic macrocytic anemia - Hgb stable  - Transfuse if Hgb < 7   Thrombocytopenia, chronic PLT stable, no need for transfusion   Diabetes mellitus type 2  CBG goal 140-180 with SSI    DVT: Heparin   Prognosis: Phone call meeting at 14:30 with Legal Sherlynn Iha about care plan     Labs   CBC: Recent Labs  Lab 06/19/24 2052 06/20/24 0303 06/21/24 0545 06/22/24 0454  WBC 12.0* 20.7* 22.4* 20.3*  NEUTROABS 9.1*  --   --   --   HGB 10.7* 8.3* 9.3* 8.5*  HCT 34.6* 26.5* 29.8* 26.9*  MCV 109.8* 110.4* 107.2* 106.3*  PLT 139* 120* 129* 117*    Basic Metabolic Panel: Recent Labs  Lab 06/19/24 2052 06/20/24 0303 06/21/24 0545 06/21/24 1927 06/22/24 0454  NA 132* 132* 138  --  137  K 3.9 3.4* 3.7  --  3.8  CL 96* 98 104  --  106  CO2 22 20* 22  --  23  GLUCOSE 102* 86 78  --  208*  BUN 26* 27* 20  --  22  CREATININE 1.38* 1.36* 1.08*  --  0.91  CALCIUM  7.7* 7.2* 7.7*  --  7.4*  MG  --  1.6* 2.7*  --  2.8*  PHOS  --   --   --  2.4* 1.4*   GFR: Estimated Creatinine Clearance: 73.1 mL/min (by C-G formula based on SCr of 0.91 mg/dL). Recent Labs  Lab 06/19/24 2052 06/19/24 2058 06/20/24 0303 06/20/24 0439 06/20/24 0752 06/21/24 0545 06/21/24 0830 06/21/24 2229 06/22/24 0454  WBC 12.0*  --  20.7*  --   --  22.4*  --   --  20.3*  LATICACIDVEN  --    < >  --    < > 5.5*  --  6.0* 5.7* 2.9*   < > = values in this interval not displayed.    Liver Function Tests: Recent Labs  Lab 06/19/24 2052  AST 49*  ALT 13  ALKPHOS 223*  BILITOT 0.9  PROT 9.8*  ALBUMIN  <1.5*   No results for input(s): LIPASE, AMYLASE in the last 168 hours. No results for input(s): AMMONIA in the last 168 hours.  ABG    Component Value Date/Time   PHART 7.414 11/16/2022 1418   PCO2ART 56.4 (H) 11/16/2022 1418   PO2ART 335 (H) 11/16/2022 1418   HCO3 30.1 (H) 03/09/2023 2335   TCO2  28 05/19/2024 1455   ACIDBASEDEF 3.9 (H) 02/15/2022 2230   O2SAT 98 03/09/2023 2335     Coagulation Profile: Recent Labs  Lab 06/19/24 2052  INR 1.5*    Cardiac Enzymes: No results for input(s): CKTOTAL, CKMB, CKMBINDEX, TROPONINI in the last 168 hours.  HbA1C: Hgb A1c MFr Bld  Date/Time Value Ref Range Status  05/29/2024 03:35 AM 5.7 (H) 4.8 - 5.6 % Final    Comment:    (NOTE) Diagnosis of Diabetes The following HbA1c ranges recommended by the American Diabetes Association (ADA) may be used as an aid in the diagnosis of diabetes mellitus.  Hemoglobin             Suggested A1C NGSP%              Diagnosis  <5.7                   Non Diabetic  5.7-6.4                Pre-Diabetic  >6.4                   Diabetic  <7.0                   Glycemic control for                       adults with diabetes.    11/26/2022 10:43 AM 5.4 4.8 - 5.6 % Final    Comment:    (NOTE) Pre diabetes:          5.7%-6.4%  Diabetes:              >6.4%  Glycemic control for   <7.0% adults with diabetes     CBG: Recent Labs  Lab 06/21/24 1929 06/21/24 2329 06/22/24 0336 06/22/24 0734 06/22/24 1152  GLUCAP 128* 171* 204* 190* 142*    Review of Systems:   As above   Past Medical History:  She,  has a past medical history of Aphasia, Arthritis, Asthma, Cardiac pacemaker, Cerebral amyloid angiopathy (CODE), CKD (chronic kidney disease), Cognitive communication deficit, Diabetes mellitus without complication (HCC), GERD (gastroesophageal reflux disease), 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   Surgical History:   Past Surgical History:  Procedure Laterality Date   BACK SURGERY      Dec 18, 2016 Dr.Torrealba   BREAST BIOPSY Right    BREAST EXCISIONAL BIOPSY     CERVICAL SPINE SURGERY     C4   CESAREAN SECTION     GASTRIC BYPASS  03/2007   IR GJ TUBE CHANGE  11/16/2023   IR REPLC DUODEN/JEJUNO TUBE PERCUT W/FLUORO  01/27/2023   IR REPLC DUODEN/JEJUNO TUBE PERCUT W/FLUORO  03/07/2023   IR REPLC DUODEN/JEJUNO TUBE PERCUT W/FLUORO  04/30/2023   IR REPLC DUODEN/JEJUNO TUBE PERCUT W/FLUORO  09/02/2023   IR REPLC DUODEN/JEJUNO TUBE PERCUT W/FLUORO  09/24/2023   IR REPLC DUODEN/JEJUNO TUBE PERCUT W/FLUORO  04/02/2024   IR REPLC DUODEN/JEJUNO TUBE PERCUT W/FLUORO  06/14/2024   TONSILLECTOMY     TRANSESOPHAGEAL ECHOCARDIOGRAM (CATH LAB) N/A 04/01/2024   Procedure: TRANSESOPHAGEAL ECHOCARDIOGRAM;  Surgeon: Lonni Slain, MD;  Location: [facility] INVASIVE CV [facility];  Service: Cardiovascular;  Laterality: N/A;     Social History:   reports that she quit smoking about 44 years ago. She started smoking about 48 years ago. She has never used smokeless tobacco. She reports that she does not drink alcohol  and does not use drugs.   Family History:  Her family history includes Cancer in her brother and sister; Diabetes in her father and mother; High blood pressure in her mother; Stroke in her father.   Allergies Allergies  Allergen Reactions   Tape Rash   Demerol [Meperidine Hcl] Nausea And Vomiting   Sulfonamide Derivatives Itching   Bacitracin-Polymyxin B Dermatitis    Not listed on the MAR   Oxycontin  [Oxycodone ] Itching  Home Medications  Prior to Admission medications   Medication Sig Start Date End Date Taking? Authorizing Provider  acetaminophen  (TYLENOL ) 160 MG/5ML solution Place 20 mLs (640 mg total) into feeding tube every 8 (eight) hours. 12/03/22  Yes Elgergawy, Brayton RAMAN, MD  acetic acid  0.25 % irrigation 20cc irrigation twice a day, flush foley to prevent clogging 11/06/23  Yes [provider]  amiodarone  (PACERONE ) 200 MG tablet Place 1 tablet (200 mg  total) into feeding tube daily. 12/03/22  Yes Elgergawy, Brayton RAMAN, MD  ascorbic acid  (VITAMIN C ) 500 MG tablet Place 0.5 tablets (250 mg total) into feeding tube 2 (two) times daily. 06/09/23  Yes Rai, Ripudeep K, MD  aspirin  81 MG chewable tablet Place 81 mg into feeding tube at bedtime.   Yes [provider]  cefadroxil  (DURICEF) 500 MG/5ML suspension Take 10 mLs (1,000 mg total) by mouth every 12 (twelve) hours. Patient taking differently: Place 1,000 mg into feeding tube every 12 (twelve) hours. 05/07/24 11/03/24 Yes Vu, Constance DASEN, MD  chlorhexidine  (PERIDEX ) 0.12 % solution Dab a toothbrush into 15 mLs and brush teeth 2 times a day   Yes [provider]  Cranberry 425 MG CAPS Give 425 mg by tube in the morning and at bedtime.   Yes [provider]  cyclobenzaprine  (FLEXERIL ) 5 MG tablet Place 2.5 mg into feeding tube in the morning and at bedtime. Hold If Sedated   Yes [provider]  docusate sodium  (COLACE) 100 MG capsule Give 1 capsule per tube twice a day   Yes [provider]  estradiol  (ESTRACE ) 0.1 MG/GM vaginal cream Place vaginally See admin instructions. Apply a pea-sized amount to the urethra/vaginal wall on Tuesdays and Fridays   Yes [provider]  famotidine  (PEPCID ) 40 MG/5ML suspension Place 40 mg into feeding tube daily. 03/25/22  Yes [provider]  folic acid  (FOLVITE ) 800 MCG tablet Place 800 mcg into feeding tube daily.   Yes [provider]  HYDROcodone -acetaminophen  (NORCO/VICODIN) 5-325 MG tablet Take 1 tablet by mouth 2 (two) times daily as needed (for pain). Patient taking differently: Place 1 tablet into feeding tube 2 (two) times daily as needed (for pain). 06/15/24  Yes Elgergawy, Brayton RAMAN, MD  levETIRAcetam  (KEPPRA ) 100 MG/ML solution Place 1,000 mg into feeding tube 2 (two) times daily.   Yes [provider]  liver oil-zinc  oxide (DESITIN) 40 % ointment Apply topically 2 (two) times daily.  Apply Desitin around G tube site BID and change split thickness gauze each time 05/31/24  Yes Cheryle Page, MD  metoprolol  tartrate (LOPRESSOR ) 25 MG tablet Place 1 tablet (25 mg total) into feeding tube 2 (two) times daily. 12/03/22  Yes Elgergawy, Brayton RAMAN, MD  Multiple Vitamin (MULTIVITAMIN WITH MINERALS) TABS tablet Place 1 tablet into feeding tube daily. 04/06/24  Yes Mabe, Elna, MD  Nutritional Supplements (FEEDING SUPPLEMENT, OSMOLITE 1.2 CAL,) LIQD Place 1,000 mLs into feeding tube continuous. 05/31/24  Yes Cheryle Page, MD  nystatin  ointment (MYCOSTATIN ) Apply 1 Application topically 3 (three) times daily. 04/26/24  Yes [provider]  OXYGEN Inhale 1-5 L into the lungs in the morning and at bedtime. To keep sats >92%   Yes [provider]  polyethylene glycol (MIRALAX  / GLYCOLAX ) 17 g packet Place 34 g into feeding tube daily. 06/09/23  Yes Rai, Ripudeep K, MD  Protein (FEEDING SUPPLEMENT, PROSOURCE TF20,) liquid Place 60 mLs into feeding tube daily. Patient taking differently: Place 45 mLs into feeding tube 3 (three)  times daily. 06/01/24  Yes Cheryle Page, MD  simethicone  (MYLICON) 80 MG chewable tablet Place 80 mg into feeding tube 3 (three) times daily.   Yes [provider]  simvastatin  (ZOCOR ) 10 MG tablet Place 1 tablet (10 mg total) into feeding tube at bedtime. 02/22/22  Yes Cheryle Page, MD  torsemide  (DEMADEX ) 10 MG tablet Place 10 mg into feeding tube every 8 (eight) hours. Give 10 mg per tube every 12 hours for edema, hold for a systolic reading less than 110   Yes [provider]  valproic  acid (DEPAKENE ) 250 MG/5ML solution Place 10-15 mLs into feeding tube See admin instructions. 10 ml via tube three times a day and 15 ml at bedtime   Yes [provider]  Water  For Irrigation, Sterile (FREE WATER ) SOLN Place 200 mLs into feeding tube every 4 (four) hours. Patient taking differently: Place 150 mLs into feeding tube every 4 (four)  hours. 05/31/24  Yes Cheryle Page, MD  diazePAM , 20 MG Dose, (VALTOCO  20 MG DOSE) 2 x 10 MG/0.1ML LQPK Place 20 mg into the nose daily as needed (seizures). Patient not taking: Reported on 06/20/2024    [provider]     Critical care time:

## 2024-06-23 NOTE — Progress Notes (Signed)
 [id number] [facility] Liaison Note  Received a request from ICM Lauraine Saa for hospice services at facility after discharge.  Spoke with [name] Search to confirm interest and initiate education of our services.  Kimnberly verbalized understanding of information given.  Per discussion, the plan is for discharge back to [facility] when medically stable.  Thank you for the opportunity to participate in this patient's care.  Inocente Jacobs BSN RN [facility] Liaison [phone]

## 2024-06-23 NOTE — Discharge Summary (Signed)
 CRITICAL CARE DISCHARGE SUMMARY Patient ID: Samona Chihuahua MRN: [id number] DOB/AGE: October 14, 1950 73 y.o.  Admit date: 06/19/2024 Discharge date: 06/23/2024  Problem List Principal Problem:   Shock [facility])  HPI: This is a 73 yo F, chronically bedbound, prior cva (aphasic,  right sided hemiparesis) with a chronic PEG tube for nutrition, chronic urinary retention with indwelling Foley catheter, seizure disorder, advanced dementia, stage IV sacral pressure ulcer, with multiple prior hospitalizations consisted of MRSA bacteremia, endocarditis 02/2024; Pseudomonas UTI 10/22; most recent hospitalization with pseudomonas bacteremia discharged on 11/18 after finishing 10 day course of IV abx.  Pt was transported back to her SNF without issue but re-presents shortly after on 11/22 with fever and tachycardia. EMS reports concern for infection at peg site or ppm leads (which is known and being chronically treated). 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  After speaking to legal guardian and family, decision was made for comfort care with hospice.    Hospital Course Outline:  11/23 Hypotensive despite fluid resuscitation requiring pressors support > admitted to ICU. 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 Blood cultures 11/22 > Pseudomonas and Staph epidermidis. MRSA nare positive. RSV and UA negative. She was started on IV cefepime  and Vanc. Avoid meropenem  with history of seizure and currently on AED medications.    Shock 2/2 sepsis likely from infect AICD leads or PEG site (chronic) vs chronically colonized  Lactic Acidosis  Lactic acid 2.9 < 5.7; patient was weaned off Levophed  as patient is able to maintain MAP > 65     Chronic PEG tube (jejunostomy catheter exchanged on 11/17 by IR)  Malnutrition  CT a/p wo obstruction; no fluctuation or discharge noted at the PEG site. She was started on TF.    CVA 2018 right-sided deficit chronic indwelling PEG tube/Foley catheter - Continue aspirin  and statin   History of seizure disorder - Continue Keppra  1000 mg BID and Valproic  acid 750 at bedtime - Cautious with seizure lowering medications     Paroxysmal A-fib, rate controlled  - Continue Amiodarone  200 daily - Continue Midodrine  20 TID    History of endocarditis/ MRSA bacteremia  - In the setting of AICD, recently discharged on lifelong cefadroxil .    Chronic macrocytic anemia - Hgb stable    Thrombocytopenia, chronic PLT stable, no need for transfusion    Diabetes mellitus type 2  CBG goal 140-180 with SSI    Labs at discharge Lab Results  Component Value Date   CREATININE 0.91 06/22/2024   BUN 22 06/22/2024   NA 137 06/22/2024   K 3.8 06/22/2024   CL 106 06/22/2024   CO2 23 06/22/2024   Lab Results  Component Value Date   WBC 20.3 (H) 06/22/2024   HGB 8.5 (L) 06/22/2024   HCT 26.9 (L) 06/22/2024   MCV 106.3 (H)  06/22/2024   PLT 117 (L) 06/22/2024   Lab Results  Component Value Date   ALT 13 06/19/2024   AST 49 (H) 06/19/2024   GGT 119 (H) 06/20/2024   ALKPHOS 223 (H) 06/19/2024   BILITOT 0.9 06/19/2024   Lab Results  Component Value Date   INR 1.5 (H) 06/19/2024   INR  1.4 (H) 06/05/2024   INR 1.7 (H) 06/04/2024    Current radiology studies No results found.  Disposition: [name] with hospice; DNR Comfort    Discharge disposition: 51-Hospice/Medical Facility       Discharge Instructions     Diet - low sodium heart healthy   Complete by: As directed    Increase activity slowly   Complete by: As directed    No wound care   Complete by: As directed       Allergies as of 06/23/2024       Reactions   Tape Rash   Demerol [meperidine Hcl] Nausea And Vomiting   Sulfonamide Derivatives Itching   Bacitracin-polymyxin B Dermatitis   Not listed on the MAR   Oxycontin  [oxycodone ] Itching        Medication List     STOP taking these medications    acetaminophen  160 MG/5ML solution Commonly known as: TYLENOL  Replaced by: acetaminophen  325 MG tablet   acetic acid  0.25 % irrigation   amiodarone  200 MG tablet Commonly known as: PACERONE    ascorbic acid  500 MG tablet Commonly known as: VITAMIN C    aspirin  81 MG chewable tablet   cefadroxil  500 MG/5ML suspension Commonly known as: DURICEF   chlorhexidine  0.12 % solution Commonly known as: PERIDEX    Cranberry 425 MG Caps   docusate sodium  100 MG capsule Commonly known as: COLACE   estradiol  0.1 MG/GM vaginal cream Commonly known as: ESTRACE    famotidine  40 MG/5ML suspension Commonly known as: PEPCID    feeding supplement (OSMOLITE 1.2 CAL) Liqd   feeding supplement (PROSource TF20) liquid   folic acid  800 MCG tablet Commonly known as: FOLVITE    free water  Soln   HYDROcodone -acetaminophen  5-325 MG tablet Commonly known as: NORCO/VICODIN   levETIRAcetam  100 MG/ML solution Commonly known as: KEPPRA    liver oil-zinc  oxide 40 % ointment Commonly known as: DESITIN   metoprolol  tartrate 25 MG tablet Commonly known as: LOPRESSOR    multivitamin with minerals Tabs tablet   nystatin  ointment Commonly known as: MYCOSTATIN    OXYGEN   simethicone  80 MG  chewable tablet Commonly known as: MYLICON   simvastatin  10 MG tablet Commonly known as: ZOCOR    torsemide  10 MG tablet Commonly known as: DEMADEX    Valtoco  20 MG Dose 10 MG/0.1ML Lqpk Generic drug: diazePAM  (20 MG Dose)       TAKE these medications    acetaminophen  325 MG tablet Commonly known as: TYLENOL  Take 2 tablets (650 mg total) by mouth every 6 (six) hours as needed for mild pain (pain score 1-3) (or Fever >/= 101). Replaces: acetaminophen  160 MG/5ML solution   acetaminophen  650 MG suppository Commonly known as: TYLENOL  Place 1 suppository (650 mg total) rectally every 6 (six) hours as needed for mild pain (pain score 1-3) (or Fever >/= 101).   antiseptic oral rinse Liqd Apply 15 mLs topically 2 (two) times daily.   artificial tears ophthalmic solution Place 1 drop into both eyes 4 (four) times daily as needed for dry eyes.   cyclobenzaprine  5 MG tablet Commonly known as: FLEXERIL  Place 0.5 tablets (2.5 mg total) into feeding tube 2 (two) times daily.  What changed:  when to take this additional instructions   diphenhydrAMINE  50 MG/ML injection Commonly known as: BENADRYL  Inject 0.5 mLs (25 mg total) into the vein every 4 (four) hours as needed for itching.   glycopyrrolate  0.2 MG/ML injection Commonly known as: ROBINUL  Inject 1 mL (0.2 mg total) into the skin every 4 (four) hours as needed (excessive secretions).   haloperidol  2 MG tablet Commonly known as: HALDOL  Place 1 tablet (2 mg total) into feeding tube every 6 (six) hours as needed for agitation (or delirium).   haloperidol  2 MG/ML solution Commonly known as: HALDOL  Place 1 mL (2 mg total) under the tongue every 6 (six) hours as needed for agitation (or delirium).   haloperidol  lactate 5 MG/ML injection Commonly known as: HALDOL  Inject 0.4 mLs (2 mg total) into the vein every 6 (six) hours as needed (or delirium).   LORazepam  2 MG/ML injection Commonly known as: ATIVAN  Inject 0.5 mLs (1 mg  total) into the vein every hour as needed for anxiety or seizure (distress).   morphine  CONCENTRATE 10 mg / 0.5 ml concentrated solution Place 0.25-0.5 mLs (5-10 mg total) into feeding tube every 2 (two) hours as needed for up to 3 days for severe pain (pain score 7-10), anxiety or shortness of breath (increased work of breathing, RR >25, distress).   ondansetron  4 MG/2ML Soln injection Commonly known as: ZOFRAN  Inject 2 mLs (4 mg total) into the vein every 6 (six) hours as needed for nausea.   polyethylene glycol 17 g packet Commonly known as: MIRALAX  / GLYCOLAX  Take 17 g by mouth daily as needed for moderate constipation. What changed:  how much to take how to take this when to take this reasons to take this   valproic  acid 250 MG/5ML solution Commonly known as: DEPAKENE  Place 10 mLs (500 mg total) into feeding tube 3 (three) times daily between meals AND 15 mLs (750 mg total) at bedtime. What changed: See the new instructions.          Discharged Condition: DNR comfort   Time spent on discharge greater than 40 minutes.  Patient was evaluated at bedside, appeared with minimal labor breathing. RN notified to give PRN pain medication to optimize comfort.    Vital signs at Discharge. Temp:  [98 F (36.7 C)-99.3 F (37.4 C)] 98 F (36.7 C) (11/26 0552) Pulse Rate:  [65-108] 108 (11/26 0552) Resp:  [17-24] 24 (11/25 1838) BP: (99-147)/(57-82) 132/69 (11/26 0552) SpO2:  [87 %-100 %] 87 % (11/26 0552)   Signed: Toma Edwards, DO  Internal Medicine resident 06/23/2024, 10:47 AM  See [place] for CCM page If no response to pager, please call [phone] until 1900 After 1900 please call [facility] [phone]

## 2024-06-26 LAB — CULTURE, BLOOD (ROUTINE X 2)
Culture: NO GROWTH
Culture: NO GROWTH
Special Requests: ADEQUATE
Special Requests: ADEQUATE

## 2024-06-28 DEATH — deceased
